# Patient Record
Sex: Female | Born: 1945
Health system: Southern US, Community
[De-identification: ages and names within clinical notes are randomized; demographics above are authoritative.]

## PROBLEM LIST (undated history)

## (undated) ENCOUNTER — Emergency Department (HOSPITAL_COMMUNITY): Admission: EM | Disposition: A | Discharge status: Home / Self Care | Payer: BC Managed Care – PPO

## (undated) DIAGNOSIS — R310 Gross hematuria: Secondary | ICD-10-CM

## (undated) DIAGNOSIS — G8929 Other chronic pain: Secondary | ICD-10-CM

## (undated) DIAGNOSIS — I471 Supraventricular tachycardia, unspecified: Secondary | ICD-10-CM

## (undated) DIAGNOSIS — G40109 Localization-related (focal) (partial) symptomatic epilepsy and epileptic syndromes with simple partial seizures, not intractable, without status epilepticus: Secondary | ICD-10-CM

## (undated) DIAGNOSIS — I350 Nonrheumatic aortic (valve) stenosis: Secondary | ICD-10-CM

## (undated) DIAGNOSIS — Z9889 Other specified postprocedural states: Secondary | ICD-10-CM

## (undated) DIAGNOSIS — N183 Chronic kidney disease, stage 3 unspecified: Secondary | ICD-10-CM

## (undated) DIAGNOSIS — Z8739 Personal history of other diseases of the musculoskeletal system and connective tissue: Secondary | ICD-10-CM

## (undated) DIAGNOSIS — M545 Low back pain, unspecified: Secondary | ICD-10-CM

## (undated) DIAGNOSIS — I2510 Atherosclerotic heart disease of native coronary artery without angina pectoris: Secondary | ICD-10-CM

## (undated) DIAGNOSIS — R569 Unspecified convulsions: Secondary | ICD-10-CM

## (undated) DIAGNOSIS — M1990 Unspecified osteoarthritis, unspecified site: Secondary | ICD-10-CM

## (undated) DIAGNOSIS — F419 Anxiety disorder, unspecified: Secondary | ICD-10-CM

## (undated) DIAGNOSIS — M47812 Spondylosis without myelopathy or radiculopathy, cervical region: Secondary | ICD-10-CM

## (undated) DIAGNOSIS — K219 Gastro-esophageal reflux disease without esophagitis: Secondary | ICD-10-CM

## (undated) DIAGNOSIS — R112 Nausea with vomiting, unspecified: Secondary | ICD-10-CM

## (undated) DIAGNOSIS — F329 Major depressive disorder, single episode, unspecified: Secondary | ICD-10-CM

## (undated) DIAGNOSIS — F32A Depression, unspecified: Secondary | ICD-10-CM

## (undated) DIAGNOSIS — N189 Chronic kidney disease, unspecified: Secondary | ICD-10-CM

## (undated) DIAGNOSIS — I82409 Acute embolism and thrombosis of unspecified deep veins of unspecified lower extremity: Secondary | ICD-10-CM

## (undated) DIAGNOSIS — G5603 Carpal tunnel syndrome, bilateral upper limbs: Principal | ICD-10-CM

## (undated) DIAGNOSIS — K589 Irritable bowel syndrome without diarrhea: Secondary | ICD-10-CM

## (undated) DIAGNOSIS — M6530 Trigger finger, unspecified finger: Secondary | ICD-10-CM

## (undated) DIAGNOSIS — K648 Other hemorrhoids: Secondary | ICD-10-CM

## (undated) DIAGNOSIS — R531 Weakness: Secondary | ICD-10-CM

## (undated) DIAGNOSIS — E039 Hypothyroidism, unspecified: Secondary | ICD-10-CM

## (undated) DIAGNOSIS — R011 Cardiac murmur, unspecified: Secondary | ICD-10-CM

## (undated) DIAGNOSIS — J8410 Pulmonary fibrosis, unspecified: Secondary | ICD-10-CM

## (undated) DIAGNOSIS — Z8719 Personal history of other diseases of the digestive system: Secondary | ICD-10-CM

## (undated) DIAGNOSIS — E669 Obesity, unspecified: Secondary | ICD-10-CM

## (undated) DIAGNOSIS — I10 Essential (primary) hypertension: Secondary | ICD-10-CM

## (undated) DIAGNOSIS — Z87442 Personal history of urinary calculi: Secondary | ICD-10-CM

## (undated) DIAGNOSIS — I951 Orthostatic hypotension: Secondary | ICD-10-CM

## (undated) DIAGNOSIS — E559 Vitamin D deficiency, unspecified: Secondary | ICD-10-CM

## (undated) DIAGNOSIS — K222 Esophageal obstruction: Secondary | ICD-10-CM

## (undated) DIAGNOSIS — H269 Unspecified cataract: Secondary | ICD-10-CM

## (undated) DIAGNOSIS — I4589 Other specified conduction disorders: Secondary | ICD-10-CM

## (undated) DIAGNOSIS — Z5189 Encounter for other specified aftercare: Secondary | ICD-10-CM

## (undated) DIAGNOSIS — R079 Chest pain, unspecified: Secondary | ICD-10-CM

## (undated) DIAGNOSIS — E785 Hyperlipidemia, unspecified: Secondary | ICD-10-CM

## (undated) DIAGNOSIS — Z8774 Personal history of (corrected) congenital malformations of heart and circulatory system: Secondary | ICD-10-CM

## (undated) HISTORY — DX: Other specified conduction disorders: I45.89

## (undated) HISTORY — DX: Gastro-esophageal reflux disease without esophagitis: K21.9

## (undated) HISTORY — DX: Esophageal obstruction: K22.2

## (undated) HISTORY — DX: Trigger finger, unspecified finger: M65.30

## (undated) HISTORY — DX: Unspecified convulsions: R56.9

## (undated) HISTORY — DX: Weakness: R53.1

## (undated) HISTORY — DX: Major depressive disorder, single episode, unspecified: F32.9

## (undated) HISTORY — DX: Cardiac murmur, unspecified: R01.1

## (undated) HISTORY — DX: Low back pain, unspecified: M54.50

## (undated) HISTORY — DX: Orthostatic hypotension: I95.1

## (undated) HISTORY — DX: Hyperlipidemia, unspecified: E78.5

## (undated) HISTORY — DX: Low back pain: M54.5

## (undated) HISTORY — DX: Chronic kidney disease, stage 3 (moderate): N18.3

## (undated) HISTORY — DX: Carpal tunnel syndrome, bilateral upper limbs: G56.03

## (undated) HISTORY — DX: Other chronic pain: G89.29

## (undated) HISTORY — DX: Obesity, unspecified: E66.9

## (undated) HISTORY — DX: Essential (primary) hypertension: I10

## (undated) HISTORY — DX: Unspecified cataract: H26.9

## (undated) HISTORY — DX: Acute embolism and thrombosis of unspecified deep veins of unspecified lower extremity: I82.409

## (undated) HISTORY — DX: Other hemorrhoids: K64.8

## (undated) HISTORY — DX: Depression, unspecified: F32.A

## (undated) HISTORY — DX: Spondylosis without myelopathy or radiculopathy, cervical region: M47.812

## (undated) HISTORY — DX: Vitamin D deficiency, unspecified: E55.9

## (undated) HISTORY — DX: Chronic kidney disease, stage 3 unspecified: N18.30

## (undated) HISTORY — PX: CARDIOVASCULAR STRESS TEST: SHX262

## (undated) HISTORY — DX: Chest pain, unspecified: R07.9

## (undated) HISTORY — DX: Anxiety disorder, unspecified: F41.9

## (undated) HISTORY — DX: Irritable bowel syndrome, unspecified: K58.9

## (undated) HISTORY — DX: Supraventricular tachycardia, unspecified: I47.10

## (undated) HISTORY — DX: Nonrheumatic aortic (valve) stenosis: I35.0

## (undated) HISTORY — PX: ECTOPIC PREGNANCY SURGERY: SHX613

## (undated) HISTORY — DX: Encounter for other specified aftercare: Z51.89

## (undated) HISTORY — DX: Unspecified osteoarthritis, unspecified site: M19.90

## (undated) HISTORY — DX: Supraventricular tachycardia: I47.1

## (undated) HISTORY — PX: CORONARY ANGIOPLASTY WITH STENT PLACEMENT: SHX49

## (undated) HISTORY — DX: Hypothyroidism, unspecified: E03.9

## (undated) HISTORY — PX: COLONOSCOPY: SHX174

---

## 1996-07-03 ENCOUNTER — Encounter: Payer: Self-pay | Admitting: Internal Medicine

## 1997-11-26 ENCOUNTER — Encounter (INDEPENDENT_AMBULATORY_CARE_PROVIDER_SITE_OTHER): Payer: Self-pay | Admitting: *Deleted

## 1997-11-26 ENCOUNTER — Other Ambulatory Visit: Admission: RE | Admit: 1997-11-26 | Discharge: 1997-11-26 | Payer: Self-pay | Admitting: Gastroenterology

## 1998-01-24 ENCOUNTER — Encounter: Payer: Self-pay | Admitting: Gastroenterology

## 1998-01-24 ENCOUNTER — Encounter (INDEPENDENT_AMBULATORY_CARE_PROVIDER_SITE_OTHER): Payer: Self-pay | Admitting: *Deleted

## 1998-01-24 ENCOUNTER — Ambulatory Visit (HOSPITAL_COMMUNITY): Admission: RE | Admit: 1998-01-24 | Discharge: 1998-01-24 | Payer: Self-pay | Admitting: Gastroenterology

## 1998-02-25 ENCOUNTER — Encounter (INDEPENDENT_AMBULATORY_CARE_PROVIDER_SITE_OTHER): Payer: Self-pay | Admitting: *Deleted

## 1998-02-26 ENCOUNTER — Encounter (INDEPENDENT_AMBULATORY_CARE_PROVIDER_SITE_OTHER): Payer: Self-pay | Admitting: *Deleted

## 1998-02-26 ENCOUNTER — Other Ambulatory Visit: Admission: RE | Admit: 1998-02-26 | Discharge: 1998-02-26 | Payer: Self-pay | Admitting: Gastroenterology

## 1998-09-08 ENCOUNTER — Encounter: Payer: Self-pay | Admitting: Adult Health

## 1998-09-19 ENCOUNTER — Encounter: Payer: Self-pay | Admitting: Internal Medicine

## 1998-09-19 ENCOUNTER — Ambulatory Visit (HOSPITAL_COMMUNITY): Admission: RE | Admit: 1998-09-19 | Discharge: 1998-09-19 | Payer: Self-pay | Admitting: Internal Medicine

## 1998-10-23 ENCOUNTER — Ambulatory Visit (HOSPITAL_COMMUNITY): Admission: RE | Admit: 1998-10-23 | Discharge: 1998-10-23 | Payer: Self-pay | Admitting: Pulmonary Disease

## 1998-10-23 ENCOUNTER — Encounter: Payer: Self-pay | Admitting: Pulmonary Disease

## 1998-11-17 ENCOUNTER — Encounter: Payer: Self-pay | Admitting: Internal Medicine

## 2000-11-17 ENCOUNTER — Other Ambulatory Visit: Admission: RE | Admit: 2000-11-17 | Discharge: 2000-11-17 | Payer: Self-pay | Admitting: Obstetrics and Gynecology

## 2001-02-05 ENCOUNTER — Encounter (INDEPENDENT_AMBULATORY_CARE_PROVIDER_SITE_OTHER): Payer: Self-pay | Admitting: *Deleted

## 2001-07-06 ENCOUNTER — Ambulatory Visit (HOSPITAL_COMMUNITY): Admission: RE | Admit: 2001-07-06 | Discharge: 2001-07-06 | Payer: Self-pay | Admitting: Internal Medicine

## 2001-07-06 ENCOUNTER — Encounter (INDEPENDENT_AMBULATORY_CARE_PROVIDER_SITE_OTHER): Payer: Self-pay | Admitting: *Deleted

## 2001-07-06 ENCOUNTER — Encounter: Payer: Self-pay | Admitting: Internal Medicine

## 2001-11-21 ENCOUNTER — Other Ambulatory Visit: Admission: RE | Admit: 2001-11-21 | Discharge: 2001-11-21 | Payer: Self-pay | Admitting: Obstetrics and Gynecology

## 2003-01-22 ENCOUNTER — Other Ambulatory Visit: Admission: RE | Admit: 2003-01-22 | Discharge: 2003-01-22 | Payer: Self-pay | Admitting: Obstetrics and Gynecology

## 2003-08-31 ENCOUNTER — Emergency Department (HOSPITAL_COMMUNITY): Admission: EM | Admit: 2003-08-31 | Discharge: 2003-09-01 | Payer: Self-pay | Admitting: Emergency Medicine

## 2003-09-01 ENCOUNTER — Encounter (INDEPENDENT_AMBULATORY_CARE_PROVIDER_SITE_OTHER): Payer: Self-pay | Admitting: *Deleted

## 2003-10-02 ENCOUNTER — Encounter: Payer: Self-pay | Admitting: Internal Medicine

## 2004-02-11 ENCOUNTER — Ambulatory Visit: Payer: Self-pay | Admitting: Internal Medicine

## 2004-02-28 ENCOUNTER — Ambulatory Visit (HOSPITAL_COMMUNITY): Admission: RE | Admit: 2004-02-28 | Discharge: 2004-02-28 | Payer: Self-pay | Admitting: Internal Medicine

## 2004-02-28 ENCOUNTER — Ambulatory Visit: Payer: Self-pay | Admitting: Internal Medicine

## 2004-04-07 ENCOUNTER — Other Ambulatory Visit: Admission: RE | Admit: 2004-04-07 | Discharge: 2004-04-07 | Payer: Self-pay | Admitting: Obstetrics and Gynecology

## 2004-06-02 ENCOUNTER — Encounter: Admission: RE | Admit: 2004-06-02 | Discharge: 2004-06-02 | Payer: Self-pay | Admitting: Obstetrics and Gynecology

## 2004-06-02 ENCOUNTER — Encounter (INDEPENDENT_AMBULATORY_CARE_PROVIDER_SITE_OTHER): Payer: Self-pay | Admitting: Specialist

## 2004-07-13 ENCOUNTER — Encounter: Admission: RE | Admit: 2004-07-13 | Discharge: 2004-07-13 | Payer: Self-pay | Admitting: General Surgery

## 2004-07-16 ENCOUNTER — Encounter: Admission: RE | Admit: 2004-07-16 | Discharge: 2004-07-16 | Payer: Self-pay | Admitting: General Surgery

## 2004-07-16 ENCOUNTER — Encounter (INDEPENDENT_AMBULATORY_CARE_PROVIDER_SITE_OTHER): Payer: Self-pay | Admitting: *Deleted

## 2004-07-16 ENCOUNTER — Ambulatory Visit (HOSPITAL_COMMUNITY): Admission: RE | Admit: 2004-07-16 | Discharge: 2004-07-16 | Payer: Self-pay | Admitting: General Surgery

## 2004-07-16 ENCOUNTER — Ambulatory Visit (HOSPITAL_BASED_OUTPATIENT_CLINIC_OR_DEPARTMENT_OTHER): Admission: RE | Admit: 2004-07-16 | Discharge: 2004-07-16 | Payer: Self-pay | Admitting: General Surgery

## 2005-01-13 ENCOUNTER — Ambulatory Visit: Payer: Self-pay | Admitting: Internal Medicine

## 2005-01-20 ENCOUNTER — Ambulatory Visit: Payer: Self-pay | Admitting: Internal Medicine

## 2005-01-20 ENCOUNTER — Ambulatory Visit (HOSPITAL_COMMUNITY): Admission: RE | Admit: 2005-01-20 | Discharge: 2005-01-20 | Payer: Self-pay | Admitting: Internal Medicine

## 2006-01-06 ENCOUNTER — Ambulatory Visit: Payer: Self-pay | Admitting: Internal Medicine

## 2007-01-12 DIAGNOSIS — I219 Acute myocardial infarction, unspecified: Secondary | ICD-10-CM

## 2007-01-12 HISTORY — DX: Acute myocardial infarction, unspecified: I21.9

## 2007-02-03 ENCOUNTER — Ambulatory Visit: Payer: Self-pay | Admitting: Internal Medicine

## 2007-07-24 ENCOUNTER — Telehealth (INDEPENDENT_AMBULATORY_CARE_PROVIDER_SITE_OTHER): Payer: Self-pay | Admitting: *Deleted

## 2007-07-24 DIAGNOSIS — R1310 Dysphagia, unspecified: Secondary | ICD-10-CM | POA: Insufficient documentation

## 2007-08-30 ENCOUNTER — Ambulatory Visit: Payer: Self-pay | Admitting: Internal Medicine

## 2007-12-04 ENCOUNTER — Telehealth: Payer: Self-pay | Admitting: Internal Medicine

## 2008-01-19 DIAGNOSIS — K589 Irritable bowel syndrome without diarrhea: Secondary | ICD-10-CM

## 2008-01-19 DIAGNOSIS — K219 Gastro-esophageal reflux disease without esophagitis: Secondary | ICD-10-CM | POA: Insufficient documentation

## 2008-01-22 ENCOUNTER — Ambulatory Visit: Payer: Self-pay | Admitting: Internal Medicine

## 2008-06-27 ENCOUNTER — Encounter: Admission: RE | Admit: 2008-06-27 | Discharge: 2008-06-27 | Payer: Self-pay | Admitting: Obstetrics and Gynecology

## 2008-07-05 ENCOUNTER — Encounter: Admission: RE | Admit: 2008-07-05 | Discharge: 2008-07-05 | Payer: Self-pay | Admitting: Obstetrics and Gynecology

## 2008-08-09 ENCOUNTER — Ambulatory Visit (HOSPITAL_BASED_OUTPATIENT_CLINIC_OR_DEPARTMENT_OTHER): Admission: RE | Admit: 2008-08-09 | Discharge: 2008-08-09 | Payer: Self-pay | Admitting: Surgery

## 2008-08-09 ENCOUNTER — Encounter (INDEPENDENT_AMBULATORY_CARE_PROVIDER_SITE_OTHER): Payer: Self-pay | Admitting: Surgery

## 2008-08-09 ENCOUNTER — Encounter: Admission: RE | Admit: 2008-08-09 | Discharge: 2008-08-09 | Payer: Self-pay | Admitting: Surgery

## 2008-08-09 HISTORY — PX: OTHER SURGICAL HISTORY: SHX169

## 2009-03-10 ENCOUNTER — Ambulatory Visit: Payer: Self-pay | Admitting: Internal Medicine

## 2009-03-31 ENCOUNTER — Telehealth: Payer: Self-pay | Admitting: Internal Medicine

## 2009-04-15 ENCOUNTER — Ambulatory Visit: Payer: Self-pay | Admitting: Internal Medicine

## 2009-04-15 DIAGNOSIS — R1013 Epigastric pain: Secondary | ICD-10-CM

## 2009-04-17 ENCOUNTER — Ambulatory Visit: Payer: Self-pay | Admitting: Internal Medicine

## 2009-06-21 ENCOUNTER — Emergency Department (HOSPITAL_COMMUNITY): Admission: EM | Admit: 2009-06-21 | Discharge: 2009-06-21 | Payer: Self-pay | Admitting: Emergency Medicine

## 2009-10-27 ENCOUNTER — Emergency Department (HOSPITAL_COMMUNITY): Admission: EM | Admit: 2009-10-27 | Discharge: 2009-10-28 | Payer: Self-pay | Admitting: Emergency Medicine

## 2009-11-06 ENCOUNTER — Encounter: Payer: Self-pay | Admitting: Nurse Practitioner

## 2009-11-22 ENCOUNTER — Encounter (INDEPENDENT_AMBULATORY_CARE_PROVIDER_SITE_OTHER): Payer: Self-pay | Admitting: *Deleted

## 2009-11-22 ENCOUNTER — Ambulatory Visit: Payer: Self-pay | Admitting: Cardiology

## 2009-11-22 ENCOUNTER — Inpatient Hospital Stay (HOSPITAL_COMMUNITY)
Admission: EM | Admit: 2009-11-22 | Discharge: 2009-11-25 | Payer: Self-pay | Source: Home / Self Care | Admitting: Emergency Medicine

## 2009-11-23 ENCOUNTER — Encounter (INDEPENDENT_AMBULATORY_CARE_PROVIDER_SITE_OTHER): Payer: Self-pay | Admitting: *Deleted

## 2009-11-25 ENCOUNTER — Encounter (INDEPENDENT_AMBULATORY_CARE_PROVIDER_SITE_OTHER): Payer: Self-pay | Admitting: *Deleted

## 2009-11-26 ENCOUNTER — Encounter (INDEPENDENT_AMBULATORY_CARE_PROVIDER_SITE_OTHER): Payer: Self-pay | Admitting: *Deleted

## 2009-12-09 ENCOUNTER — Encounter: Payer: Self-pay | Admitting: Nurse Practitioner

## 2009-12-10 ENCOUNTER — Telehealth: Payer: Self-pay | Admitting: Internal Medicine

## 2009-12-10 ENCOUNTER — Encounter: Payer: Self-pay | Admitting: Nurse Practitioner

## 2009-12-12 ENCOUNTER — Encounter: Payer: Self-pay | Admitting: Internal Medicine

## 2009-12-12 ENCOUNTER — Ambulatory Visit: Payer: Self-pay | Admitting: Gastroenterology

## 2009-12-12 DIAGNOSIS — D649 Anemia, unspecified: Secondary | ICD-10-CM | POA: Insufficient documentation

## 2009-12-14 DIAGNOSIS — I2510 Atherosclerotic heart disease of native coronary artery without angina pectoris: Secondary | ICD-10-CM | POA: Insufficient documentation

## 2009-12-18 ENCOUNTER — Ambulatory Visit: Payer: Self-pay | Admitting: Internal Medicine

## 2009-12-25 ENCOUNTER — Encounter (HOSPITAL_COMMUNITY)
Admission: RE | Admit: 2009-12-25 | Discharge: 2010-02-10 | Payer: Self-pay | Source: Home / Self Care | Attending: Cardiology | Admitting: Cardiology

## 2010-01-16 ENCOUNTER — Ambulatory Visit: Payer: Self-pay | Admitting: Oncology

## 2010-01-30 LAB — CBC WITH DIFFERENTIAL/PLATELET
BASO%: 0.6 % (ref 0.0–2.0)
Basophils Absolute: 0 10*3/uL (ref 0.0–0.1)
EOS%: 1.9 % (ref 0.0–7.0)
Eosinophils Absolute: 0.1 10*3/uL (ref 0.0–0.5)
HCT: 30.6 % — ABNORMAL LOW (ref 34.8–46.6)
HGB: 10.1 g/dL — ABNORMAL LOW (ref 11.6–15.9)
LYMPH%: 26.9 % (ref 14.0–49.7)
MCH: 27.7 pg (ref 25.1–34.0)
MCHC: 33.1 g/dL (ref 31.5–36.0)
MCV: 83.7 fL (ref 79.5–101.0)
MONO#: 0.6 10*3/uL (ref 0.1–0.9)
MONO%: 9.8 % (ref 0.0–14.0)
NEUT#: 3.6 10*3/uL (ref 1.5–6.5)
NEUT%: 60.8 % (ref 38.4–76.8)
Platelets: 263 10*3/uL (ref 145–400)
RBC: 3.65 10*6/uL — ABNORMAL LOW (ref 3.70–5.45)
RDW: 14.2 % (ref 11.2–14.5)
WBC: 5.9 10*3/uL (ref 3.9–10.3)
lymph#: 1.6 10*3/uL (ref 0.9–3.3)

## 2010-01-30 LAB — CHCC SMEAR

## 2010-02-03 LAB — SPEP & IFE WITH QIG
Alpha-1-Globulin: 4.5 % (ref 2.9–4.9)
Alpha-2-Globulin: 10.8 % (ref 7.1–11.8)
Beta 2: 4.3 % (ref 3.2–6.5)
Beta Globulin: 5.7 % (ref 4.7–7.2)
Gamma Globulin: 17.5 % (ref 11.1–18.8)
IgG (Immunoglobin G), Serum: 1410 mg/dL (ref 694–1618)
IgM, Serum: 174 mg/dL (ref 60–263)
Total Protein, Serum Electrophoresis: 7 g/dL (ref 6.0–8.3)

## 2010-02-03 LAB — COMPREHENSIVE METABOLIC PANEL
ALT: 10 U/L (ref 0–35)
AST: 20 U/L (ref 0–37)
Albumin: 4.1 g/dL (ref 3.5–5.2)
Alkaline Phosphatase: 123 U/L — ABNORMAL HIGH (ref 39–117)
BUN: 19 mg/dL (ref 6–23)
CO2: 25 mEq/L (ref 19–32)
Calcium: 9.1 mg/dL (ref 8.4–10.5)
Chloride: 103 mEq/L (ref 96–112)
Creatinine, Ser: 1.26 mg/dL — ABNORMAL HIGH (ref 0.40–1.20)
Glucose, Bld: 108 mg/dL — ABNORMAL HIGH (ref 70–99)
Potassium: 4.1 mEq/L (ref 3.5–5.3)
Sodium: 140 mEq/L (ref 135–145)
Total Bilirubin: 0.5 mg/dL (ref 0.3–1.2)
Total Protein: 7 g/dL (ref 6.0–8.3)

## 2010-02-03 LAB — IRON AND TIBC
%SAT: 16 % — ABNORMAL LOW (ref 20–55)
Iron: 53 ug/dL (ref 42–145)
TIBC: 340 ug/dL (ref 250–470)
UIBC: 287 ug/dL

## 2010-02-03 LAB — FERRITIN: Ferritin: 7 ng/mL — ABNORMAL LOW (ref 10–291)

## 2010-02-03 LAB — VITAMIN B12: Vitamin B-12: 281 pg/mL (ref 211–911)

## 2010-02-03 LAB — FOLATE: Folate: 15.4 ng/mL

## 2010-02-03 LAB — ERYTHROPOIETIN: Erythropoietin: 75.4 m[IU]/mL — ABNORMAL HIGH (ref 2.6–34.0)

## 2010-02-11 ENCOUNTER — Encounter (HOSPITAL_COMMUNITY): Payer: BC Managed Care – PPO | Attending: Cardiology

## 2010-02-11 DIAGNOSIS — F3289 Other specified depressive episodes: Secondary | ICD-10-CM | POA: Insufficient documentation

## 2010-02-11 DIAGNOSIS — G40909 Epilepsy, unspecified, not intractable, without status epilepticus: Secondary | ICD-10-CM | POA: Insufficient documentation

## 2010-02-11 DIAGNOSIS — Z9861 Coronary angioplasty status: Secondary | ICD-10-CM | POA: Insufficient documentation

## 2010-02-11 DIAGNOSIS — F329 Major depressive disorder, single episode, unspecified: Secondary | ICD-10-CM | POA: Insufficient documentation

## 2010-02-11 DIAGNOSIS — K219 Gastro-esophageal reflux disease without esophagitis: Secondary | ICD-10-CM | POA: Insufficient documentation

## 2010-02-11 DIAGNOSIS — Z5189 Encounter for other specified aftercare: Secondary | ICD-10-CM | POA: Insufficient documentation

## 2010-02-11 DIAGNOSIS — I2119 ST elevation (STEMI) myocardial infarction involving other coronary artery of inferior wall: Secondary | ICD-10-CM | POA: Insufficient documentation

## 2010-02-11 DIAGNOSIS — I2510 Atherosclerotic heart disease of native coronary artery without angina pectoris: Secondary | ICD-10-CM | POA: Insufficient documentation

## 2010-02-11 DIAGNOSIS — E039 Hypothyroidism, unspecified: Secondary | ICD-10-CM | POA: Insufficient documentation

## 2010-02-11 DIAGNOSIS — I10 Essential (primary) hypertension: Secondary | ICD-10-CM | POA: Insufficient documentation

## 2010-02-11 DIAGNOSIS — E785 Hyperlipidemia, unspecified: Secondary | ICD-10-CM | POA: Insufficient documentation

## 2010-02-12 NOTE — Letter (Signed)
Summary: [facility] Instructions  [facility]  [address] [place], [place] [address]   Phone: [phone]  Fax: [phone]       [name]    1945/03/02    MRN: [id number]        Procedure Day /Date: 12-19-2009     Arrival Time:8:00 Am      Procedure Time: 9:00 AM     Location of Procedure:                     X     [facility] ( Outpatient Registration)   PREPARATION FOR COLONOSCOPY WITH MOVIPREP   Starting 5 days prior to your procedure 12-14-2009 do not eat nuts, seeds, popcorn, corn, beans, peas,  salads, or any raw vegetables.  Do not take any fiber supplements (e.g. Metamucil, Citrucel, and Benefiber).  THE DAY BEFORE YOUR PROCEDURE         DATE: 12-18-2009  DAY: Thursday  1.  Drink clear liquids the entire day-NO SOLID FOOD  2.  Do not drink anything colored red or purple.  Avoid juices with pulp.  No orange juice.  3.  Drink at least 64 oz. (8 glasses) of fluid/clear liquids during the day to prevent dehydration and help the prep work efficiently.  CLEAR LIQUIDS INCLUDE: Water Jello Ice Popsicles Tea (sugar ok, no milk/cream) Powdered fruit flavored drinks Coffee (sugar ok, no milk/cream) Gatorade Juice: apple, white grape, white cranberry  Lemonade Clear bullion, consomm, broth Carbonated beverages (any kind) Strained chicken noodle soup Hard Candy                             4.  In the morning, mix first dose of MoviPrep solution:    Empty 1 Pouch A and 1 Pouch B into the disposable container    Add lukewarm drinking water to the top line of the container. Mix to dissolve    Refrigerate (mixed solution should be used within 24 hrs)  5.  Begin drinking the prep at 5:00 p.m. The MoviPrep container is divided by 4 marks.   Every 15 minutes drink the solution down to the next mark (approximately 8 oz) until the full liter is complete.   6.  Follow completed prep with 16 oz of clear liquid of your choice (Nothing red or  purple).  Continue to drink clear liquids until bedtime.  7.  Before going to bed, mix second dose of MoviPrep solution:    Empty 1 Pouch A and 1 Pouch B into the disposable container    Add lukewarm drinking water to the top line of the container. Mix to dissolve    Refrigerate  THE DAY OF YOUR PROCEDURE      DATE: 12-19-2009 DAY: Friday  Beginning at 5:00 AM  (5 hours before procedure):         1. Every 15 minutes, drink the solution down to the next mark (approx 8 oz) until the full liter is complete.  2. Follow completed prep with 16 oz. of clear liquid of your choice.    3. You may drink clear liquids until 7:00 AM  (2 HOURS BEFORE PROCEDURE).   MEDICATION INSTRUCTIONS  Unless otherwise instructed, you should take regular prescription medications with a small sip of water   as early as possible the morning of your procedure.  Stay on the Plavix medication. We will notify you if any changes needs  made. We will notify your cardiologist you are having the procedures.          OTHER INSTRUCTIONS  You will need a responsible adult at least 64 years of age to accompany you and drive you home.   This person must remain in the waiting room during your procedure.  Wear loose fitting clothing that is easily removed.  Leave jewelry and other valuables at home.  However, you may wish to bring a book to read or  an iPod/MP3 player to listen to music as you wait for your procedure to start.  Remove all body piercing jewelry and leave at home.  Total time from sign-in until discharge is approximately 2-3 hours.  You should go home directly after your procedure and rest.  You can resume normal activities the  day after your procedure.  The day of your procedure you should not:   Drive   Make legal decisions   Operate machinery   Drink alcohol   Return to work  You will receive specific instructions about eating, activities and medications before you leave.    The  above instructions have been reviewed and explained to me by   _______________________    I fully understand and can verbalize these instructions _____________________________ Date _________  Appended Document: Moviprep Instructions LM for pt to advise her that Dr Marina Goodell requested we change her proc date to Thurs 12-18-09 at 10.30, arriving at 9:30 AM. I asked her to call me and I will review her  instructions with her.  Appended Document: Moviprep Instructions The pt 's husband called and I asked if the 8th in our private Endoscopy Suite at 10 :30 arriving at 9:30 AM was acceptable.  He said his wife is fine with the change.  I reviewed the changes on the instructions with the times and he understood and said he would let his wife know about the changes.  She was napping and that is why he called me back.

## 2010-02-12 NOTE — Progress Notes (Signed)
Summary: TRIAGE-CHEST PAIN   Phone Note Call from Patient Call back at Home Phone [phone]   Caller: Patient Call For: Marina Goodell Reason for Call: Talk to Nurse Summary of Call: Patient wants to be seen sooner than first available appt 4-5 do to chest pain since last week Initial call taken by: Tawni Levy,  March 31, 2009 8:43 AM  Follow-up for Phone Call        Pt. c/o constant chest pain for 1 week, gets more severe at times.  Denies sweating, SOB, pain in neck,jaw or arms. Some nausea and esophageal burning with the pain. Dysphagia x1 month. Takes Pantoprazole 40mg  two times a day. Offered an appt. today with PA, pt. declines, states she will only see Dr.Perry.  1) See Dr.Perry on 04-15-09 at 1:30pm 2) Continue Pantoprazole as directed 3) May use Mylanta,Maalox,etc. as needed 4) Soft,bland diet. Be very careful with meats,rice and breads. 5) If chest pain gets worse, if you have any SOB, sweating, pain in neck, arms,or jaw, n/v,etc. call 911 and go to the ER immediately.  6) Please call your Cardiologist today and update him on your condition. 7) Pt. instructed to call back as needed.  Follow-up by: Laureen Ochs LPN,  March 31, 2009 9:36 AM

## 2010-02-12 NOTE — Procedures (Signed)
Summary: Upper Endoscopy  Patient: [name] Note: All result statuses are Final unless otherwise noted.  Tests: (1) Upper Endoscopy (EGD)   EGD Upper Endoscopy       DONE     [facility]     [address]     [place], [place]  [address]           ENDOSCOPY PROCEDURE REPORT           PATIENT:  [name], [name]  MR#:  [id number]     BIRTHDATE:  Oct 27, 1945, 64 yrs. old  GENDER:  female           ENDOSCOPIST:  Wilhemina Bonito. Eda Keys, MD     Referred by:  Office           PROCEDURE DATE:  12/18/2009     PROCEDURE:  EGD, diagnostic 63875     ASA CLASS:  Class III     INDICATIONS:  anemia           MEDICATIONS:   There was residual sedation effect present from     prior procedure.     TOPICAL ANESTHETIC:  none           DESCRIPTION OF PROCEDURE:   After the risks benefits and     alternatives of the procedure were thoroughly explained, informed     consent was obtained.  The [facility] [id number] endoscope was     introduced through the mouth and advanced to the third portion of     the duodenum, without limitations.  The instrument was slowly     withdrawn as the mucosa was fully examined.     <<PROCEDUREIMAGES>>           The upper, middle, and distal third of the esophagus were     carefully inspected and no abnormalities were noted. The z-line     was well seen at the GEJ. The endoscope was pushed into the fundus     which was normal including a retroflexed view. Except for     incidental fundic gland polyps in the bogy/fundus, the     antrum,gastric body, first and second part of the duodenum were     unremarkable.    Retroflexed views revealed a small hiatal hernia.     The scope was then withdrawn from the patient and the procedure     completed.           COMPLICATIONS:  None           ENDOSCOPIC IMPRESSION:     1) Essentially Normal EGD     2) GERD     3) NO EVIDENCE FOR GI BLOOD LOSS AS AN EXPLAINATION FOR ANEMIA           RECOMMENDATIONS:     1) RETURN TO  THE CARE OF DR. Katrinka Blazing FOR FURTHER INVESTIGATION OF     ANEMIA     2) GI FOLLOW UP PRN           ______________________________     Wilhemina Bonito. Eda Keys, MD           CC:  Merri Brunette, MD, The Patient, Armanda Magic MD           n.     Rosalie DoctorWilhemina Bonito. Eda Keys at 12/18/2009 11:52 AM           Lady Saucier, [phone]  Note: An exclamation mark (!) indicates a result that  was not dispersed into the flowsheet. Document Creation Date: 12/18/2009 11:52 AM _______________________________________________________________________  (1) Order result status: Final Collection or observation date-time: 12/18/2009 11:44 Requested date-time:  Receipt date-time:  Reported date-time:  Referring Physician:   Ordering Physician: Fransico Setters [phone]) Specimen Source:  Source: Launa Grill Order Number: [id number] Lab site:

## 2010-02-12 NOTE — Progress Notes (Signed)
Summary: triage   Phone Note Call from Patient Call back at Home Phone [phone]   Caller: Patient Call For: Dr. Marina Goodell Reason for Call: Talk to Nurse Summary of Call: pt had heart attack several weeks ago, hospital did blood work and found hemo was around 93 and wants her to be seen her soon Initial call taken by: [place] Johnson,  December 10, 2009 1:53 PM  Follow-up for Phone Call        Pt. seen by Dr.CandiceSmith her PCP at [facility] today and hgb. 9.8 per pt. Dr. told her to f/u wit Dr.Wilma Wuthrich.Appt. made with N.P.for Friday and she is going to call PCP and [facility] cardiologist (was in hosp for M.I.) to have records faxed for appt. If they won't fax she will come in and sign record release. Follow-up by: Teryl Lucy RN,  December 10, 2009 2:56 PM

## 2010-02-12 NOTE — Letter (Signed)
Summary: [facility]   Imported By: Lester [place] 01/07/2010 07:40:57  _____________________________________________________________________  External Attachment:    Type:   Image     Comment:   External Document

## 2010-02-12 NOTE — Assessment & Plan Note (Signed)
Summary: CHEST PAIN / DYSPHAGIA           History of Present Illness Visit Type: Follow-up Visit Primary GI MD: Yancey Flemings MD Primary Provider: Severiano Gilbert, MD Chief Complaint: chest pain non-cardiac  History of Present Illness:   65 year old female with hypothyroidism, hypertension, osteoarthritis, GERD, and IBS. She was evaluated in the office 5 weeks ago reported doing well. However, she tells me that she has been having symptoms of dysphagia, epigastric pain, and abdominal cramping with diarrhea for months. Some nausea and regurgitation. She has trouble with certain pills such as potassium. She takes pantoprazole b.i.d. Dysphagia seems most prominent to solids. No weight loss. No bleeding. Her last upper endoscopy was in 2009. She was dilated with a Maloney dilator. She thinks that helped. Her anniversary for colonoscopy as around 2012. She is on bupropion for anxiety. She wonders if this might be responsible for some symptoms. Food exacerbates symptoms. She cannot find anything in particular but relieves are improved symptoms.. Her multiple chronic medical problems are otherwise stable.   GI Review of Systems    Reports abdominal pain, acid reflux, chest pain, dysphagia with solids, nausea, and  vomiting.     Location of  Abdominal pain: epigastric area.    Denies belching, bloating, dysphagia with liquids, heartburn, loss of appetite, vomiting blood, weight loss, and  weight gain.      Reports diarrhea.     Denies anal fissure, black tarry stools, change in bowel habit, constipation, diverticulosis, fecal incontinence, heme positive stool, hemorrhoids, irritable bowel syndrome, jaundice, light color stool, liver problems, rectal bleeding, and  rectal pain.    Current Medications (verified): 1)  Pantoprazole Sodium 40 Mg Tbec (Pantoprazole Sodium) .Marland Kitchen.. 1 Tablet By Mouth Two Times A Day 2)  Dilantin 100 Mg Caps (Phenytoin Sodium Extended) .... Three Times A Day 3)  Levoxyl 125 Mcg Tabs  (Levothyroxine Sodium) .... Once Daily 4)  Calcitriol 0.5 Mcg Caps (Calcitriol) .... Take Two Tabs By Mouth Once Daily 5)  Paxil 40 Mg Tabs (Paroxetine Hcl) .... Once Daily 6)  Hyzaar 100-25 Mg Tabs (Losartan Potassium-Hctz) .... Once Daily 7)  Metoprolol Tartrate 50 Mg Tabs (Metoprolol Tartrate) .... Take 1/2 Daily 8)  Doxazosin Mesylate 4 Mg Tabs (Doxazosin Mesylate) .... Take 1/2 Tab Every Eveining 9)  Multivitamins  Tabs (Multiple Vitamin) .... Once Daily 10)  Calcium-Magnesium 250-125 Mg Tabs (Calcium-Magnesium) .... Take One By Mouth Once Daily 11)  Vitamin E 400 Unit Caps (Vitamin E) .... Take 2 Daily 12)  Tylenol Pm Extra Strength 500-25 Mg Tabs (Diphenhydramine-Apap (Sleep)) .... Take One By Mouth At Bedtime 13)  Tylenol Extra Strength 500 Mg Tabs (Acetaminophen) .... Take Two By Mouth At Bedtime 14)  K-Lor 20 Meq Pack (Potassium Chloride) .... Take Two By Mouth Once Daily 15)  Bupropion Hcl 150 Mg Xr12h-Tab (Bupropion Hcl) .... Once Daily 16)  Tramadol Hcl 50 Mg Tabs (Tramadol Hcl) .... As Needed  Allergies (verified): No Known Drug Allergies  Past History:  Past Medical History: Reviewed history from 03/10/2009 and no changes required. Current Problems:  IBS (ICD-564.1) GERD (ICD-530.81) DYSPHAGIA UNSPECIFIED (ICD-787.20) Hypertension Depression Hypothyroidism  Past Surgical History: Reviewed history from 03/10/2009 and no changes required. right breast surgery X 2 years  Family History: Reviewed history from 03/10/2009 and no changes required. Family History of Breast Cancer: sister Family History of Heart Disease: father No FH of Colon Cancer:  Social History: Reviewed history from 03/10/2009 and no changes required. Patient is a former smoker.  Alcohol Use - no Daily Caffeine Use coffee Illicit Drug Use - no  Review of Systems       The patient complains of allergy/sinus, arthritis/joint pain, back pain, cough, depression-new, fatigue, headaches-new,  muscle pains/cramps, sleeping problems, and thirst - excessive.    Vital Signs:  Patient profile:   65 year old female Height:      59 inches Weight:      139 pounds BMI:     28.18 Pulse rate:   80 / minute Pulse rhythm:   regular BP sitting:   142 / 90  (left arm) Cuff size:   regular  Vitals Entered By: June McMurray CMA Duncan Dull) (April 15, 2009 1:30 PM)  Physical Exam  General:  Well developed, well nourished, no acute distress. Head:  Normocephalic and atraumatic. Eyes:  PERRLA, no icterus. Nose:  No deformity, discharge,  or lesions. Mouth:  No deformity or lesions. Neck:  Supple; no masses or thyromegaly. Lungs:  Clear throughout to auscultation. Heart:  Regular rate and rhythm; no murmurs, rubs,  or bruits. Abdomen:  Soft, obese,nontender and nondistended. No masses, hepatosplenomegaly or hernias noted. Normal bowel sounds. Msk:  Symmetrical with no gross deformities. Normal posture. Pulses:  Normal pulses noted. Extremities:  No clubbing, cyanosis, edema or deformities noted. Neurologic:  Alert and  oriented x4;  Skin:  the jaundice Psych:  Alert and cooperative. Normal mood and affect.   Impression & Recommendations:  Problem # 1:  DYSPHAGIA UNSPECIFIED (ICD-787.20) intermittent dysphagia. Likely due to recurrent peptic stricture.  Plan: #1. Upper endoscopy with esophageal dilation. The nature of the procedure as well as the risks, benefits, and alternatives have been reviewed. Understood and agreed to proceed.  Problem # 2:  GERD (ICD-530.81) ongoing.  Plan #1. Continue PPI #2. strict adherence to reflux precautions  Problem # 3:  IBS (ICD-564.1) this is the most likely cause for intermittent abdominal cramping followed by diarrhea. Consider antispasmodic if symptoms persist  Problem # 4:  ABDOMINAL PAIN-EPIGASTRIC (ICD-789.06) epigastric pain either due to acid peptic disorder, IBS, or functional. Could consider gallbladder ultrasound, notably this was  negative previously. We'll start with upper endoscopy.  Other Orders: EGD SAV (EGD SAV)  Patient Instructions: 1)  EGD with [facility].  [facility]  04/17/09 2:30 pm arrive at 1:30 pm 2)  Upper Endoscopy brochure given.  3)  Upper Endoscopy with Dilatation brochure given.  4)  The medication list was reviewed and reconciled.  All changed / newly prescribed medications were explained.  A complete medication list was provided to the patient / caregiver. 5)  printed and given to patient. Milford Cage NCMA  April 15, 2009 2:01 PM

## 2010-02-12 NOTE — Assessment & Plan Note (Signed)
Summary: GERD annual followup    History of Present Illness Visit Type: Follow-up Visit Primary GI MD: Yancey Flemings MD Primary Provider: Severiano Gilbert, MD Chief Complaint: Patient here for Protonix refills, she denies any GI complaints.  History of Present Illness:   65 year old female who presents today for followup regarding ongoing management of her reflux disease. Her chronic medical problems include hypothyroidism, hypertension, osteoarthritis,IBS, and GERD. She was last evaluated in the office 13 months ago. Since that time she has continued on pantoprazole 40 mg b.i.d. Overall, good control of reflux symptoms. Some dysphagia to liquids. No significant recurrent solid food dysphagia. Last upper endoscopy in late 2009. She has had 30 pound weight loss by intent. She is dieting and exercising. No significant lower GI complaints. She is due for followup colonoscopy around 2012.   GI Review of Systems      Denies abdominal pain, acid reflux, belching, bloating, chest pain, dysphagia with liquids, dysphagia with solids, heartburn, loss of appetite, nausea, vomiting, vomiting blood, weight loss, and  weight gain.        Denies anal fissure, black tarry stools, change in bowel habit, constipation, diarrhea, diverticulosis, fecal incontinence, heme positive stool, hemorrhoids, irritable bowel syndrome, jaundice, light color stool, liver problems, rectal bleeding, and  rectal pain. Preventive Screening-Counseling & Management  Alcohol-Tobacco     Smoking Status: quit      Drug Use:  no.      Current Medications (verified): 1)  Pantoprazole Sodium 40 Mg Tbec (Pantoprazole Sodium) .Marland Kitchen.. 1 Tablet By Mouth Two Times A Day 2)  Dilantin 100 Mg Caps (Phenytoin Sodium Extended) .... Three Times A Day 3)  Levoxyl 125 Mcg Tabs (Levothyroxine Sodium) .... Once Daily 4)  Calcitriol 0.5 Mcg Caps (Calcitriol) .... Take Two Tabs By Mouth Once Daily 5)  Paxil 40 Mg Tabs (Paroxetine Hcl) .... Once  Daily 6)  Hyzaar 100-25 Mg Tabs (Losartan Potassium-Hctz) .... Once Daily 7)  Metoprolol Tartrate 50 Mg Tabs (Metoprolol Tartrate) .... Take 1/2 Daily 8)  Doxazosin Mesylate 4 Mg Tabs (Doxazosin Mesylate) .... Take 1/2 Tab Every Eveining 9)  Multivitamins  Tabs (Multiple Vitamin) .... Once Daily 10)  Calcium-Magnesium 250-125 Mg Tabs (Calcium-Magnesium) .... Take One By Mouth Once Daily 11)  Vitamin E 400 Unit Caps (Vitamin E) .... Take 2 Daily 12)  Tylenol Pm Extra Strength 500-25 Mg Tabs (Diphenhydramine-Apap (Sleep)) .... Take One By Mouth At Bedtime 13)  Tylenol Extra Strength 500 Mg Tabs (Acetaminophen) .... Take Two By Mouth At Bedtime 14)  K-Lor 20 Meq Pack (Potassium Chloride) .... Take Two By Mouth Once Daily  Allergies (verified): No Known Drug Allergies  Past History:  Family History: Last updated: 03/10/2009 Family History of Breast Cancer: sister Family History of Heart Disease: father No FH of Colon Cancer:  Social History: Last updated: 03/10/2009 Patient is a former smoker.  Alcohol Use - no Daily Caffeine Use coffee Illicit Drug Use - no  Risk Factors: Smoking Status: quit (03/10/2009)  Past Medical History: Current Problems:  IBS (ICD-564.1) GERD (ICD-530.81) DYSPHAGIA UNSPECIFIED (ICD-787.20) Hypertension Depression Hypothyroidism  Past Surgical History: right breast surgery X 2 years  Family History: Family History of Breast Cancer: sister Family History of Heart Disease: father No FH of Colon Cancer:  Social History: Patient is a former smoker.  Alcohol Use - no Daily Caffeine Use coffee Illicit Drug Use - no Smoking Status:  quit Drug Use:  no  Review of Systems       The patient complains  of allergy/sinus, arthritis/joint pain, back pain, fatigue, muscle pains/cramps, shortness of breath, sleeping problems, swelling of feet/legs, and thirst - excessive.  The patient denies anemia, anxiety-new, blood in urine, breast changes/lumps,  change in vision, confusion, cough, coughing up blood, depression-new, fainting, fever, headaches-new, hearing problems, heart murmur, heart rhythm changes, itching, menstrual pain, night sweats, nosebleeds, pregnancy symptoms, skin rash, sore throat, swollen lymph glands, thirst - excessive , urination - excessive , urination changes/pain, urine leakage, vision changes, and voice change.    Vital Signs:  Patient profile:   65 year old female Height:      59 inches Weight:      138.0 pounds BMI:     27.97 Pulse rate:   84 / minute Pulse rhythm:   regular BP sitting:   128 / 78  (left arm) Cuff size:   regular  Vitals Entered By: Harlow Mares CMA Duncan Dull) (March 10, 2009 3:36 PM)  Physical Exam  General:  Well developed, well nourished, no acute distress. Head:  Normocephalic and atraumatic. Eyes:  PERRLA, no icterus. Mouth:  No deformity or lesions,  Lungs:  Clear throughout to auscultation. Heart:  Regular rate and rhythm; no murmurs, rubs,  or bruits. Abdomen:  Soft, nontender and nondistended. No masses, hepatosplenomegaly or hernias noted. Normal bowel sounds. Pulses:  Normal pulses noted. Extremities:  No clubbing, cyanosis, edema or deformities noted. Neurologic:  Alert and  oriented x4;   Skin:  Intact without significant lesions or rashes. Psych:  Alert and cooperative. Normal mood and affect.   Impression & Recommendations:  Problem # 1:  GERD (ICD-530.81) chronic GERD seemingly under good control  Plan: #1. Continue reflux precautions. The patient was praised with regards to her weight loss #2. Continue pantoprazole 40 mg b.i.d. A prescription with multiple refills submitted #3. Routine office followup in one year  Problem # 2:  DYSPHAGIA UNSPECIFIED (ICD-787.20) intermittent solid food dysphagia due to stricture. Chronic intermittent liquid dysphagia due to dysmotility. Currently stable.  Plans: #1. Continue PPI #2. Repeat esophageal dilation for recurrent  solid food dysphagia p.r.n.  Problem # 3:  IBS (ICD-564.1) stable  Patient Instructions: 1)  Refill Pantoprazole #60 x 11 RFs sent to pharmacy for patient to pick up. 2)  Please schedule a follow-up appointment in 1 year. 3)  The medication list was reviewed and reconciled.  All changed / newly prescribed medications were explained.  A complete medication list was provided to the patient / caregiver. 4)  Copy: Dr. Merri Brunette Prescriptions: PANTOPRAZOLE SODIUM 40 MG TBEC (PANTOPRAZOLE SODIUM) 1 tablet by mouth two times a day  #60 x 11   Entered by:   Milford Cage NCMA   Authorized by:   Hilarie Fredrickson MD   Signed by:   Milford Cage NCMA on 03/10/2009   Method used:   Electronically to        [facility] [phone]* (retail)       [address] [place], [place]  [address]       Ph: [phone] or [phone]       Fax: [phone]   RxID:   [id number]

## 2010-02-12 NOTE — Letter (Signed)
Summary: [facility]  [facility]   Imported By: Sherian Rein 03/11/2009 09:20:29  _____________________________________________________________________  External Attachment:    Type:   Image     Comment:   External Document

## 2010-02-12 NOTE — Procedures (Signed)
Summary: EGD/[facility]  EGD/[facility]   Imported By: Sherian Rein 03/11/2009 09:03:37  _____________________________________________________________________  External Attachment:    Type:   Image     Comment:   External Document

## 2010-02-12 NOTE — Procedures (Signed)
Summary: Instructions for procedure/[facility]  Instructions for procedure/   Imported By: Sherian Rein 12/15/2009 15:04:41  _____________________________________________________________________  External Attachment:    Type:   Image     Comment:   External Document

## 2010-02-12 NOTE — Procedures (Signed)
Summary: [employer]   Imported By: Sherian Rein 03/11/2009 09:05:17  _____________________________________________________________________  External Attachment:    Type:   Image     Comment:   External Document

## 2010-02-12 NOTE — Procedures (Signed)
Summary: Upper Endoscopy  Patient: [name] Note: All result statuses are Final unless otherwise noted.  Tests: (1) Upper Endoscopy (EGD)   EGD Upper Endoscopy       DONE     [facility]     [address]     [place], [place]  [address]           ENDOSCOPY PROCEDURE REPORT           PATIENT:  [name], [name]  MR#:  [id number]     BIRTHDATE:  [birth year], 63 yrs. old  GENDER:  female           ENDOSCOPIST:  Wilhemina Bonito. Eda Keys, MD     Referred by:  Office           PROCEDURE DATE:  04/17/2009     PROCEDURE:  EGD, diagnostic,     Maloney Dilation of Esophagus -34F     ASA CLASS:  Class II     INDICATIONS:  dysphagia, dilation of esophageal stricture           MEDICATIONS:   Fentanyl 75 mcg IV, Versed 7 mg IV     TOPICAL ANESTHETIC:  Exactacain Spray           DESCRIPTION OF PROCEDURE:   After the risks benefits and     alternatives of the procedure were thoroughly explained, informed     consent was obtained.  The LB-GIF-H180 [id number] endoscope was     introduced through the mouth and advanced to the second portion of     the duodenum, without limitations.  The instrument was slowly     withdrawn as the mucosa was fully examined.     <<PROCEDUREIMAGES>>           A large caliber ring-like stricture was found in the distal     esophagus.  There were multiple fundic gland type polyps     identified in the fundus/body.  Otherwise the examination was     normal to D2.    Retroflexed views revealed a small hiatal hernia.     The scope was then withdrawn from the patient and the procedure     completed.           THERAPY: 34F MALONEY DILATION W/O RESISTANCE RO HEME. TOLERATED     WELL           COMPLICATIONS:  None           ENDOSCOPIC IMPRESSION:     1) Stricture in the distal esophagus - S/P DILATION     2) Polyps, multiple in the fundus     3) Otherwise normal examination     4) Gerd     RECOMMENDATIONS:     1) continue current medications for GERD     2) Clear  liquids until 5 PM, then soft foods rest of day. Resume     prior diet tomorrow.           ______________________________     Wilhemina Bonito. Eda Keys, MD           CC:  Merri Brunette, MD, The Patient           n.     eSIGNED:   Wilhemina Bonito. Eda Keys at 04/17/2009 03:16 PM           Lady Saucier, [phone]  Note: An exclamation mark (!) indicates a result that was not dispersed into the flowsheet. Document Creation  Date: 04/17/2009 3:17 PM _______________________________________________________________________  (1) Order result status: Final Collection or observation date-time: 04/17/2009 15:08 Requested date-time:  Receipt date-time:  Reported date-time:  Referring Physician:   Ordering Physician: Fransico Setters [phone]) Specimen Source:  Source: Launa Grill Order Number: [id number] Lab site:

## 2010-02-12 NOTE — Procedures (Signed)
Summary: Flexible Sigmoidoscopy/[facility]  Flexible Sigmoidoscopy/[facility]   Imported By: Sherian Rein 03/11/2009 09:14:46  _____________________________________________________________________  External Attachment:    Type:   Image     Comment:   External Document

## 2010-02-12 NOTE — Assessment & Plan Note (Signed)
Summary: post M.I. HGB. 9.8 at [facility] office todayEalge Fa...    History of Present Illness Primary GI MD: Yancey Flemings MD Primary Provider: Severiano Gilbert, MD Requesting Provider: Merri Brunette, MD Chief Complaint: Post hosp MI with new Dx of anemia. Pt feels fatigued and tired all the time. Pt's Hgb from the hosp was 9.6. Denies any other GI sx. History of Present Illness:   Patient known to Dr. Marina Goodell for history of GERD complicated by peptic stricture and IBS. She had a normal colonoscopy by Dr. Marina Goodell for abdominal pain in 2005. Patient has multiple medical problems and is s/p acute MI with drug eluting stent to RCA on 11/12/1l. She is referred here for evaluation of anemia. In October 2010 patient's hemoglobin was 11.6.  Upon recent hospital admission 11/22/09 hemoglobin was 9.6. Patient denies prior history of anemia. No black stools, rectal bleeding, abdominal pain, or weight loss. She hasn't taken any NSAIDS except for the ASA which was started in the hospital.   GI Review of Systems      Denies abdominal pain, acid reflux, belching, bloating, chest pain, dysphagia with liquids, dysphagia with solids, heartburn, loss of appetite, vomiting, vomiting blood, weight loss, and  weight gain.        Denies anal fissure, black tarry stools, change in bowel habit, constipation, diarrhea, diverticulosis, fecal incontinence, heme positive stool, hemorrhoids, irritable bowel syndrome, jaundice, light color stool, liver problems, rectal bleeding, and  rectal pain.    Current Medications (verified): 1)  Pantoprazole Sodium 40 Mg Tbec (Pantoprazole Sodium) .Marland Kitchen.. 1 Tablet By Mouth Two Times A Day 2)  Levoxyl 100 Mcg Tabs (Levothyroxine Sodium) .... One Tablet By Mouth Once Daily 3)  Calcitriol 0.5 Mcg Caps (Calcitriol) .... One Capsule By Mouth Once Daily 4)  Toprol Xl 25 Mg Xr24h-Tab (Metoprolol Succinate) .... 1/2 Tablets By Mouth Once Daily 5)  Doxazosin Mesylate 4 Mg Tabs (Doxazosin  Mesylate) .... Take 1/2 Tab Every Eveining 6)  Multivitamins  Tabs (Multiple Vitamin) .... Once Daily 7)  Calcium-Magnesium 250-125 Mg Tabs (Calcium-Magnesium) .... Take One By Mouth Once Daily 8)  Vitamin E 400 Unit Caps (Vitamin E) .... Take 2 Daily 9)  Tylenol Pm Extra Strength 500-25 Mg Tabs (Diphenhydramine-Apap (Sleep)) .... Take One By Mouth At Bedtime 10)  Tylenol Extra Strength 500 Mg Tabs (Acetaminophen) .... Take Two By Mouth At Bedtime 11)  K-Lor 20 Meq Pack (Potassium Chloride) .... Take Two By Mouth Once Daily 12)  Bupropion Hcl 150 Mg Xr12h-Tab (Bupropion Hcl) .... Once Daily 13)  Tramadol Hcl 50 Mg Tabs (Tramadol Hcl) .... As Needed 14)  Keppra 500 Mg Tabs (Levetiracetam) .... One Tablet By Mouth Every 12 Hours 15)  Plavix 75 Mg Tabs (Clopidogrel Bisulfate) .... One Tablet By Mouth Once Daily 16)  Nitrostat 0.4 Mg Subl (Nitroglycerin) .... One Tablet By Mouth Under Tongue For Chest Pain 17)  Aspirin 325 Mg  Tabs (Aspirin) .... One Tablet By Mouth Once Daily 18)  Crestor 10 Mg Tabs (Rosuvastatin Calcium) .... One Tablet By Mouth Once Daily 19)  Cardizem La 240 Mg Xr24h-Tab (Diltiazem Hcl Coated Beads) .... One Tablet By Mouth Once Daily 20)  Losartan Potassium-Hctz 100-25 Mg Tabs (Losartan Potassium-Hctz) .... One Tablet By Mouth Once Daily 21)  Tramadol Hcl 50 Mg Tabs (Tramadol Hcl) .... One Tablet By Mouth As Needed 22)  Cymbalta 60 Mg Cpep (Duloxetine Hcl) .... One Tablet By Mouth Once Daily  Allergies (verified): No Known Drug Allergies  Past History:  Past  Medical History: Current Problems:  IBS (ICD-564.1) GERD (ICD-530.81) DYSPHAGIA UNSPECIFIED (ICD-787.20) Hypertension Depression Hypothyroidism Myocardial Infarction 11/2009 Seizures  Past Surgical History: right breast surgery X 2 years Drug-eluting stent with Ion stents to the distal right coronary artery 11/2009  Family History: Reviewed history from 03/10/2009 and no changes required. Family History  of Breast Cancer: sister, Neice Family History of Heart Disease: father No FH of Colon Cancer:  Social History: Married Patient is a former smoker.  Alcohol Use - no Daily Caffeine Use coffee Illicit Drug Use - no  Review of Systems       The patient complains of muscle pains/cramps.  The patient denies allergy/sinus, anemia, anxiety-new, arthritis/joint pain, back pain, blood in urine, breast changes/lumps, change in vision, confusion, cough, coughing up blood, depression-new, fainting, fatigue, fever, headaches-new, hearing problems, heart murmur, heart rhythm changes, itching, menstrual pain, night sweats, nosebleeds, pregnancy symptoms, shortness of breath, skin rash, sleeping problems, sore throat, swollen lymph glands, thirst - excessive , urination - excessive , urination changes/pain, urine leakage, vision changes, and voice change.    Vital Signs:  Patient profile:   65 year old female Height:      59 inches Weight:      129.25 pounds BMI:     26.20 Pulse rate:   88 / minute Pulse rhythm:   regular BP sitting:   152 / 82  (left arm) Cuff size:   regular  Vitals Entered By: Christie Nottingham CMA Duncan Dull) (December 12, 2009 8:39 AM)  Physical Exam  General:  Pale, white female, well nourished, no acute distress. Head:  Normocephalic and atraumatic. Eyes:  Conjunctiva pale. No icterus Neck:  no obvious masses  Lungs:  Clear throughout to auscultation. Heart:  RRR. Abdomen:  Abdomen soft, nontender, nondistended. No obvious masses or hepatomegaly.Normal bowel sounds.  Rectal:  No external or internal lesion. Scant light brown heme negative stool.  Msk:  Symmetrical with no gross deformities. Normal posture. Extremities:  No palmar erythema, no edema.  Neurologic:  Alert and  oriented x4;  grossly normal neurologically. Skin:  Intact without significant lesions or rashes. Cervical Nodes:  No significant cervical adenopathy. Psych:  Alert and cooperative. Normal mood and  affect.   Impression & Recommendations:  Problem # 1:  ANEMIA-UNSPECIFIED (ICD-285.9) Normocytic anemia. Two gram drop in hemoglobin over the last year (11.6g/dl to 6.2Z/HY) without overt GI blood loss. Decline in hgb. predates initiation of Plavix and ASA started 11/22/09. Patient had labs 3 days ago and those results have been requested. For further evaluation the patient will be scheduled for an EGD and colonoscopy with biopsies (if indicated).  Patient had cardiac stent placed less than 3 weeks ago so procedures will be done on Plavix. The risks and benefits of the procedures, as well as alternatives were discussed with the patient and her husband and patient agrees to proceed.  Orders: EGD and colonoscopy on Plavix  Colon/Endo (Colon/Endo)  Problem # 2:  CORONARY ARTERY DISEASE (ICD-414.00) Assessment: Comment Only Status post NSTEMI with drug-eluting stent placement to the right coronary artery 11/22/09.  Patient Instructions: 1)  We scheduled the Endoscopy/Colonoscopy with Dr. Marina Goodell.  2)  Directions and brochure provided. 3)  We sent the prescription for the prep you will be drinking for the procedures to your pharmacy, [facility] [address]  4)  Copy sent to : Dr. Merri Brunette 5)  The medication list was reviewed and reconciled.  All changed / newly prescribed medications were explained.  A complete medication list was provided to the patient / caregiver. Prescriptions: MOVIPREP 100 GM  SOLR (PEG-KCL-NACL-NASULF-NA ASC-C) As per prep instructions.  #1 x 0   Entered by:   Lowry Ram NCMA   Authorized by:   Willette Cluster NP   Signed by:   Lowry Ram NCMA on 12/12/2009   Method used:   Electronically to        [facility] [phone]* (retail)       [address] [place], [place]  [address]       Ph: [phone] or [phone]       Fax: [phone]   RxID:   [id number]

## 2010-02-12 NOTE — Procedures (Signed)
Summary: EGD/[facility]  EGD/[facility]   Imported By: Sherian Rein 03/11/2009 09:21:46  _____________________________________________________________________  External Attachment:    Type:   Image     Comment:   External Document

## 2010-02-12 NOTE — Letter (Signed)
Summary: EGD Instructions  [facility]  [address] [place], [place] [address]   Phone: [phone]  Fax: [phone]       [name]    Jun 07, 1945    MRN: [id number]       Procedure Day /Date:THURSDAY, 04/17/09     Arrival Time: 1:30 PM     Procedure Time:2:30 PM     Location of Procedure:                    [facility] (4th Floor)   PREPARATION FOR ENDOSCOPY WITH DILT.   On THURSDAY, 04/17/09 THE DAY OF THE PROCEDURE:  1.   No solid foods, milk or milk products are allowed after midnight the night before your procedure.  2.   Do not drink anything colored red or purple.  Avoid juices with pulp.  No orange juice.  3.  You may drink clear liquids until 12:30 PM, which is 2 hours before your procedure.                                                                                                CLEAR LIQUIDS INCLUDE: Water Jello Ice Popsicles Tea (sugar ok, no milk/cream) Powdered fruit flavored drinks Coffee (sugar ok, no milk/cream) Gatorade Juice: apple, white grape, white cranberry  Lemonade Clear bullion, consomm, broth Carbonated beverages (any kind) Strained chicken noodle soup Hard Candy   MEDICATION INSTRUCTIONS  Unless otherwise instructed, you should take regular prescription medications with a small sip of water as early as possible the morning of your procedure.            OTHER INSTRUCTIONS  You will need a responsible adult at least 64 years of age to accompany you and drive you home.   This person must remain in the waiting room during your procedure.  Wear loose fitting clothing that is easily removed.  Leave jewelry and other valuables at home.  However, you may wish to bring a book to read or an iPod/MP3 player to listen to music as you wait for your procedure to start.  Remove all body piercing jewelry and leave at home.  Total time from sign-in until discharge is approximately 2-3 hours.  You  should go home directly after your procedure and rest.  You can resume normal activities the day after your procedure.  The day of your procedure you should not:   Drive   Make legal decisions   Operate machinery   Drink alcohol   Return to work  You will receive specific instructions about eating, activities and medications before you leave.    The above instructions have been reviewed and explained to me by   _______________________    I fully understand and can verbalize these instructions _____________________________ Date _________

## 2010-02-12 NOTE — Procedures (Signed)
Summary: Colonoscopy  Patient: [name] Note: All result statuses are Final unless otherwise noted.  Tests: (1) Colonoscopy (COL)   COL Colonoscopy           DONE     [facility]     [address]     [place], [place]  [address]           COLONOSCOPY PROCEDURE REPORT           PATIENT:  [name], [name]  MR#:  [id number]     BIRTHDATE:  [birth year], 64 yrs. old  GENDER:  female     ENDOSCOPIST:  Wilhemina Bonito. Eda Keys, MD     REF. BY:  Merri Brunette, M.D.     PROCEDURE DATE:  12/18/2009     PROCEDURE:  Diagnostic Colonoscopy     ASA CLASS:  Class III     INDICATIONS:  Anemia ; HEME NEGATIVE W/O HX OF BLEEDING     MEDICATIONS:   Fentanyl 100 mcg IV, Versed 12 mg IV, Benadryl 25     mg IV           DESCRIPTION OF PROCEDURE:   After the risks benefits and     alternatives of the procedure were thoroughly explained, informed     consent was obtained.  Digital rectal exam was performed and     revealed no abnormalities.   The LB CF-H180AL [id number] endoscope     was introduced through the anus and advanced to the cecum, which     was identified by both the appendix and ileocecal valve, without     limitations.Time to cecum = 3:13 min.  The quality of the prep was     excellent, using MoviPrep.  The instrument was then slowly     withdrawn (time = 7:07 min) as the colon was fully examined.     <<PROCEDUREIMAGES>>           FINDINGS:  The terminal ileum appeared normal.  A normal appearing     cecum, ileocecal valve, and appendiceal orifice were identified.     The ascending, hepatic flexure, transverse, splenic flexure,     descending, sigmoid colon, and rectum appeared unremarkable.     Retroflexed views in the rectum revealed internal hemorrhoids.     The scope was then withdrawn from the patient and the procedure     completed.           COMPLICATIONS:  None           ENDOSCOPIC IMPRESSION:     1) Normal terminal ileum     2) Normal colon     3) Internal hemorrhoids  RECOMMENDATIONS:     1) Continue current colorectal screening recommendations for     "routine risk" patients with a repeat colonoscopy in 10 years.     2) EGD today (see that report for final recommendations)           ______________________________     Wilhemina Bonito. Eda Keys, MD           CC:  Merri Brunette, MD; The Patient; Armanda Magic MD           n.     Rosalie DoctorWilhemina Bonito. Eda Keys at 12/18/2009 11:39 AM           Lady Saucier, [id number]  Note: An exclamation mark (!) indicates a result that was not dispersed into the flowsheet. Document Creation Date: 12/18/2009 11:40 AM  _______________________________________________________________________  (1) Order result status: Final Collection or observation date-time: 12/18/2009 11:33 Requested date-time:  Receipt date-time:  Reported date-time:  Referring Physician:   Ordering Physician: Fransico Setters [phone]) Specimen Source:  Source: Launa Grill Order Number: [id number] Lab site:   Appended Document: Colonoscopy    Clinical Lists Changes  Observations: Added new observation of COLONNXTDUE: 12/2019 (12/18/2009 12:55)

## 2010-02-12 NOTE — Discharge Summary (Signed)
Summary: Discharge Summary    NAME:  [name], [name]                ACCOUNT NO.:  [id number]      MEDICAL RECORD NO.:  [id number]          PATIENT TYPE:  INP      LOCATION:  2030                         FACILITY:  [facility]      PHYSICIAN:  Armanda Magic, M.D.     DATE OF BIRTH:  Jun 24, 1945      DATE OF ADMISSION:  11/22/2009   DATE OF DISCHARGE:  11/25/2009                                  DISCHARGE SUMMARY         ADMISSION DIAGNOSES:   1. Chest pain.   2. Acute non-ST elevation myocardial infarction.   3. History of supraventricular tachycardia.   4. Seizure disorder.   5. Hypertension.   6. Depression.   7. Hypothyroidism.      DISCHARGE DIAGNOSES:   1. Acute inferior posterior lateral myocardial infarction, status post       drug-eluting stent placement to the right coronary artery.   2. Hypertension, controlled.   3. Seizure disorder.   4. Hypothyroidism with over supplementation on Levoxyl with dose       adjusted.   5. Depression.   6. Dyslipidemia.      HISTORY OF PRESENT ILLNESS:  This is a very pleasant 65 year old female   with a history of SVT and seizure disorder, who presented to the   emergency room with complaints of substernal chest pain, intermittent   throughout the day, and on arrival of the EMS, she was diagnosed with ST   depression in 1 and aVL and J-point elevation in V4 through V6 and was   considered ST-elevation MI, although there was no real true ST   elevation.  She did present to the emergency, was transferred to the   cardiac catheterization laboratory and emergent cardiac catheterization.   This revealed a patent left circumflex, patent left main, and a patent   ramus intermediate with a 25% mid stenosis of the LAD and right coronary   artery with diffuse 50% calcified stenosis in the midvessel and 95%   lesion with TIMI 3 flow in the distal RCA.  LV function showed normal LV   function with EF 60% with inferior basal hypokinesis.  She underwent     drug-eluting stent placement with an Ion stents to the distal right   coronary artery and did well postop.  She was started on aspirin and   Plavix.  She was also started on Lipitor 20 mg daily.  She had an   uneventful hospital course with no arrhythmias.  She was noted during   her hospital stay to have hyperthyroidism with a suppressed TSH of 0.89   and her thyroid medicine was adjusted.  Her cardiac enzymes peaked at a   total creatine kinase of 289 with CPK-MB 39.5, troponin 6.45, and   trended down from there.  On the day of discharge, she was in stable   condition without any chest pain, ambulated cardiac rehab without any   problems.  Her labs during her hospital stay showed hemoglobin 5.7,   total  cholesterol 177, triglycerides 45, HDL 78, LDL 90.  Sodium 136,   potassium 4, chloride 106, bicarb 27, BUN 13, creatinine 1.12.  LFTs   normal.  Albumin 3.2.  White cell count 10.0, hemoglobin 9.6, hematocrit   28.8, platelet count 274.  Chest x-ray on admission revealed mild   cardiomegaly without acute findings.      Her discharge medications include:   1. Aspirin 325 mg daily.   2. Lipitor 20 mg daily.   3. Plavix 75 mg daily.   4. Toprol-XL 25 mg 1/2 tablet daily.   5. Levoxyl was decreased to 100 mcg daily.   6. Calcitriol 0.5 mcg daily.   7. Calcium over the counter daily.   8. Cardizem CD 240 mg daily.   9. Cymbalta 60 mg daily.   10.Doxazosin 4 mg 1/2 tablet daily.   11.Hydrocodone/APAP 5/500 mg every 6 hours as needed for pain.   12.Klor-Con 20 mEq 2 tablets daily.   13.Levetiracetam 500 mg b.i.d.   14.Losartan/hydrochlorothiazide 100/25 mg daily.   15.Protonix 40 mg twice daily.   16.Tramadol 50 mg twice daily as needed for pain.   17.Tylenol PM over the counter 1 at bedtime as needed.   18.Tylenol extra strength 500 mg capsules daily as needed for pain.   19.Vitamin E 1 tablet daily.   20.Sublingual nitroglycerin 0.4 mg tablets take as directed for chest        pain.      FOLLOWUP:  The patient will follow up in my office with my nurse   practitioner on December 09, 2009 at 10 a.m.  She will also have labs   drawn on January 06, 2010 for a complete metabolic panel with fasting   lipid panel and a TSH.               Armanda Magic, M.D.               TT/MEDQ  D:  11/25/2009  T:  11/26/2009  Job:  [id number]      cc:   Dario Guardian, M.D.      Electronically Signed by Armanda Magic M.D. on 12/01/2009 02:48:33 PM

## 2010-02-12 NOTE — Letter (Signed)
Summary: Merri Brunette MD/[facility]  Merri Brunette MD/[facility]   Imported By: Lester Oakmont 01/07/2010 07:38:19  _____________________________________________________________________  External Attachment:    Type:   Image     Comment:   External Document

## 2010-02-13 ENCOUNTER — Encounter (HOSPITAL_COMMUNITY): Payer: BC Managed Care – PPO

## 2010-02-16 ENCOUNTER — Encounter (HOSPITAL_COMMUNITY): Payer: BC Managed Care – PPO

## 2010-02-18 ENCOUNTER — Encounter (HOSPITAL_COMMUNITY): Payer: BC Managed Care – PPO

## 2010-02-20 ENCOUNTER — Encounter (HOSPITAL_COMMUNITY): Payer: BC Managed Care – PPO

## 2010-02-23 ENCOUNTER — Encounter (HOSPITAL_COMMUNITY): Payer: BC Managed Care – PPO

## 2010-02-24 ENCOUNTER — Encounter (HOSPITAL_BASED_OUTPATIENT_CLINIC_OR_DEPARTMENT_OTHER): Payer: BC Managed Care – PPO | Admitting: Oncology

## 2010-02-24 DIAGNOSIS — D649 Anemia, unspecified: Secondary | ICD-10-CM

## 2010-02-25 ENCOUNTER — Encounter (HOSPITAL_COMMUNITY): Payer: BC Managed Care – PPO

## 2010-02-26 ENCOUNTER — Encounter (HOSPITAL_BASED_OUTPATIENT_CLINIC_OR_DEPARTMENT_OTHER): Payer: BC Managed Care – PPO | Admitting: Oncology

## 2010-02-26 DIAGNOSIS — D649 Anemia, unspecified: Secondary | ICD-10-CM

## 2010-02-27 ENCOUNTER — Encounter (HOSPITAL_COMMUNITY): Payer: BC Managed Care – PPO

## 2010-03-02 ENCOUNTER — Encounter (HOSPITAL_COMMUNITY): Payer: BC Managed Care – PPO

## 2010-03-04 ENCOUNTER — Encounter (HOSPITAL_COMMUNITY): Payer: BC Managed Care – PPO

## 2010-03-05 ENCOUNTER — Encounter (HOSPITAL_BASED_OUTPATIENT_CLINIC_OR_DEPARTMENT_OTHER): Payer: BC Managed Care – PPO | Admitting: Oncology

## 2010-03-05 DIAGNOSIS — D649 Anemia, unspecified: Secondary | ICD-10-CM

## 2010-03-06 ENCOUNTER — Encounter (HOSPITAL_COMMUNITY): Payer: BC Managed Care – PPO

## 2010-03-09 ENCOUNTER — Encounter: Payer: Self-pay | Admitting: Internal Medicine

## 2010-03-09 ENCOUNTER — Ambulatory Visit (INDEPENDENT_AMBULATORY_CARE_PROVIDER_SITE_OTHER): Payer: BC Managed Care – PPO | Admitting: Internal Medicine

## 2010-03-09 ENCOUNTER — Encounter (HOSPITAL_COMMUNITY): Payer: BC Managed Care – PPO

## 2010-03-09 DIAGNOSIS — K219 Gastro-esophageal reflux disease without esophagitis: Secondary | ICD-10-CM

## 2010-03-10 NOTE — Procedures (Signed)
Summary: EGD   EGD  Procedure date:  01/20/2005  Findings:      Location: [facility]  Findings: Stricture:  GERD Patient Name: Izadora, Roehr. MRN:  Procedure Procedures: Panendoscopy (EGD) CPT: 43235.    with esophageal dilation. CPT: G9296129.  Personnel: Endoscopist: Wilhemina Bonito. Marina Goodell, MD.  Exam Location: Exam performed in Endoscopy Suite.  Patient Consent: Procedure, Alternatives, Risks and Benefits discussed, consent obtained, from patient. Consent was obtained by the RN.  Indications  Therapeutics: Reason for exam: Esophageal dilation.  Symptoms: Dysphagia.  History  Current Medications: Patient is not currently taking Coumadin.  Comments: Patient history reviewed/updated, physical exam performed prior to initiation of sedation? Pre-Exam Physical: Performed Jan 20, 2005  Cardio-pulmonary exam, Abdominal exam, Mental status exam WNL.  Comments: Pt. history reviewed/updated, physical exam performed prior to initiation of sedation? yes Exam Exam Info: Maximum depth of insertion Duodenum, intended Duodenum. Patient position: on left side. Vocal cords visualized. Gastric retroflexion performed. Images taken. ASA Classification: II. Tolerance: excellent.  Sedation Meds: Patient assessed and found to be appropriate for moderate (conscious) sedation. Fentanyl 80 given IV. Versed 8 mg. given IV. Cetacaine Spray given aerosolized.  Monitoring: BP and pulse monitoring done. Oximetry used. Supplemental O2 given  Fluoroscopy: Fluoroscopy was used.  Findings - Normal: Proximal Esophagus to Distal Esophagus. Comments: No appreciable resistance at UES.  STRICTURE / STENOSIS: Stricture in Distal Esophagus.  Constriction: partial. Etiology: benign due to reflux. 39 cm from mouth. Lumen diameter is 16 mm. ICD9: Esophageal Stricture: 530.3.  - Dilation: Distal Esophagus. Procedure was performed under Fluoroscopy. Wire Guided/Savary (Wilson-Cook) dilator used,  Diameter: 18 mm, No Resistance, No Heme present on extraction. 1  total dilators used. Patient tolerance excellent.  POLYP: in Body. Comments: multiple small (<65mm) benign fundic gland appearring polyps as previous.    Comments: Otherwise normal EGD to d3 Assessment Abnormal examination, see findings above.  Diagnoses: 530.3: Esophageal Stricture.  530.81: GERD.   Events  Unplanned Intervention: No unplanned interventions were required.  Unplanned Events: There were no complications. Plans Instructions: Nothing to eat or drink for 2 hrs.  Clear or full liquids: 2 hrs. Resume previous diet: am.  Medication(s): Continue current medications.  Disposition: After procedure patient sent to recovery. After recovery patient sent home.  Scheduling: Follow-up prn.   This report was created from the original endoscopy report, which was reviewed and signed by the above listed endoscopist.   cc:  Ellamae Sia, MD      The Patient

## 2010-03-10 NOTE — Procedures (Signed)
Summary: EGD   EGD  Procedure date:  02/28/2004  Findings:      Location: [facility]  Findings: Stricture:  GERD  EGD  Procedure date:  02/28/2004  Findings:      Location: [facility]  Findings: Stricture:  GERD Patient Name: [name], [name]. MRN:  Procedure Procedures: Panendoscopy (EGD) CPT: 43235.    with esophageal dilation. CPT: G9296129.  Personnel: Endoscopist: Wilhemina Bonito. Marina Goodell, MD.  Exam Location: Exam performed in Endoscopy Suite.  Patient Consent: Procedure, Alternatives, Risks and Benefits discussed, consent obtained,  Indications  Therapeutics: Reason for exam: Esophageal dilation.  Symptoms: Dysphagia.  History  Current Medications: Patient is not currently taking Coumadin.  Pre-Exam Physical: Performed Oct 02, 2003  Entire physical exam was normal.  Exam Exam Info: Maximum depth of insertion Duodenum, intended Duodenum. Patient position: on left side. Vocal cords visualized. Gastric retroflexion performed. Images taken. ASA Classification: II. Tolerance: excellent.  Sedation Meds: Demerol 80 mg. given IV. Versed 9 mg. given IV.  Monitoring: BP and pulse monitoring done. Oximetry used. Supplemental O2 given  Fluoroscopy: Fluoroscopy was used.  Findings STRICTURE / STENOSIS: Stricture in Distal Esophagus.  Constriction: partial. Etiology: benign due to reflux. 39 cm from mouth. Lumen diameter is 15 mm. ICD9: Esophageal Stricture: 530.3.  - Dilation: Distal Esophagus. Procedure was performed under Fluoroscopy. Wire Guided/Savary (Wilson-Cook) dilator used, Diameter: 18 mm, No Resistance, No Heme present on extraction. 1  total dilators used. Patient tolerance excellent.  - POLYP: in Fundus. ICD9: . Comments: multiple fundic gland polyps as previous.   Assessment Normal examination.  Diagnoses: 530.3: Esophageal Stricture.  .  530.81: GERD.   Events  Unplanned Intervention: No unplanned interventions were required.    Unplanned Events: There were no complications. Plans Instructions: Nothing to eat or drink for 2 hrs.  Clear or full liquids: 2 hrs. Resume previous diet: 4 hrs.  Medication(s): Continue current medications.  Disposition: After procedure patient sent to recovery.  Scheduling: Follow-up prn.   This report was created from the original endoscopy report, which was reviewed and signed by the above listed endoscopist.   cc:  The Patient

## 2010-03-11 ENCOUNTER — Encounter (HOSPITAL_COMMUNITY): Payer: BC Managed Care – PPO

## 2010-03-13 ENCOUNTER — Encounter (HOSPITAL_COMMUNITY): Payer: BC Managed Care – PPO

## 2010-03-16 ENCOUNTER — Encounter (HOSPITAL_COMMUNITY): Payer: BC Managed Care – PPO | Attending: Cardiology

## 2010-03-16 DIAGNOSIS — I2510 Atherosclerotic heart disease of native coronary artery without angina pectoris: Secondary | ICD-10-CM | POA: Insufficient documentation

## 2010-03-16 DIAGNOSIS — I10 Essential (primary) hypertension: Secondary | ICD-10-CM | POA: Insufficient documentation

## 2010-03-16 DIAGNOSIS — E785 Hyperlipidemia, unspecified: Secondary | ICD-10-CM | POA: Insufficient documentation

## 2010-03-16 DIAGNOSIS — K219 Gastro-esophageal reflux disease without esophagitis: Secondary | ICD-10-CM | POA: Insufficient documentation

## 2010-03-16 DIAGNOSIS — Z9861 Coronary angioplasty status: Secondary | ICD-10-CM | POA: Insufficient documentation

## 2010-03-16 DIAGNOSIS — I2119 ST elevation (STEMI) myocardial infarction involving other coronary artery of inferior wall: Secondary | ICD-10-CM | POA: Insufficient documentation

## 2010-03-16 DIAGNOSIS — G40909 Epilepsy, unspecified, not intractable, without status epilepticus: Secondary | ICD-10-CM | POA: Insufficient documentation

## 2010-03-16 DIAGNOSIS — E039 Hypothyroidism, unspecified: Secondary | ICD-10-CM | POA: Insufficient documentation

## 2010-03-16 DIAGNOSIS — Z5189 Encounter for other specified aftercare: Secondary | ICD-10-CM | POA: Insufficient documentation

## 2010-03-16 DIAGNOSIS — F3289 Other specified depressive episodes: Secondary | ICD-10-CM | POA: Insufficient documentation

## 2010-03-16 DIAGNOSIS — F329 Major depressive disorder, single episode, unspecified: Secondary | ICD-10-CM | POA: Insufficient documentation

## 2010-03-18 ENCOUNTER — Encounter (HOSPITAL_COMMUNITY): Payer: BC Managed Care – PPO

## 2010-03-19 NOTE — Procedures (Signed)
Summary: 24 Hr pH Study/[facility]  24 Hr pH Study/[facility]   Imported By: Sherian Rein 03/10/2010 09:54:39  _____________________________________________________________________  External Attachment:    Type:   Image     Comment:   External Document

## 2010-03-19 NOTE — Assessment & Plan Note (Signed)
Summary: MED REFILL  and GERD followup    History of Present Illness Visit Type: Follow-up Visit Primary GI MD: Yancey Flemings MD Primary Provider: Severiano Gilbert, MD Requesting Provider: na Chief Complaint: Patient here to follow up on her Genella Rife and gter her Protonix refilled. She denies any GI complaints. She has had two iron infusions over the last 2 weeks.  History of Present Illness:    65 year old female with hypertension, hypothyroidism, coronary artery disease with prior myocardial infarction , GERD, IBS, and dysphagia. she presents now regarding routine GERD followup and requests medication refill. she was last seen in December of 2000 11 for evaluation of Hemoccult negative iron deficiency anemia. upper endoscopy and colonoscopy were unrevealing in this regard. She has since been seen by a hematologist and is on IV iron infusions. In terms of GERD, her symptoms are controlled with pantoprazole 40 mg twice a day. no problems with dysphagia at this time.   GI Review of Systems      Denies abdominal pain, acid reflux, belching, bloating, chest pain, dysphagia with liquids, dysphagia with solids, heartburn, loss of appetite, nausea, vomiting, vomiting blood, weight loss, and  weight gain.        Denies anal fissure, black tarry stools, change in bowel habit, constipation, diarrhea, diverticulosis, fecal incontinence, heme positive stool, hemorrhoids, irritable bowel syndrome, jaundice, light color stool, liver problems, rectal bleeding, and  rectal pain.    Current Medications (verified): 1)  Pantoprazole Sodium 40 Mg Tbec (Pantoprazole Sodium) .Marland Kitchen.. 1 Tablet By Mouth Two Times A Day 2)  Levoxyl 100 Mcg Tabs (Levothyroxine Sodium) .... One Tablet By Mouth Once Daily 3)  Calcitriol 0.5 Mcg Caps (Calcitriol) .... One Capsule By Mouth Once Daily 4)  Toprol Xl 25 Mg Xr24h-Tab (Metoprolol Succinate) .... 1/2 Tablets By Mouth Once Daily 5)  Doxazosin Mesylate 4 Mg Tabs (Doxazosin Mesylate) ....  Take 1  Tab Every Eveining 6)  Calcium-Vitamin D 600-125 Mg-Unit Tabs (Calcium-Vitamin D) .... Take One By Mouth Once Daily 7)  Vitamin E 400 Unit Caps (Vitamin E) .... Take 1 Daily 8)  Tylenol Pm Extra Strength 500-25 Mg Tabs (Diphenhydramine-Apap (Sleep)) .... Take One By Mouth At Bedtime 9)  Tylenol Extra Strength 500 Mg Tabs (Acetaminophen) .... Take Two By Mouth As Needed 10)  K-Lor 20 Meq Pack (Potassium Chloride) .... Take Two By Mouth Once Daily 11)  Keppra 500 Mg Tabs (Levetiracetam) .... One Tablet By Mouth Every 12 Hours 12)  Plavix 75 Mg Tabs (Clopidogrel Bisulfate) .... One Tablet By Mouth Once Daily 13)  Nitrostat 0.4 Mg Subl (Nitroglycerin) .... One Tablet By Mouth Under Tongue For Chest Pain 14)  Aspirin 325 Mg  Tabs (Aspirin) .... One Tablet By Mouth Once Daily 15)  Crestor 10 Mg Tabs (Rosuvastatin Calcium) .... One Tablet By Mouth Once Daily 16)  Cardizem La 240 Mg Xr24h-Tab (Diltiazem Hcl Coated Beads) .... One Tablet By Mouth Once Daily 17)  Losartan Potassium-Hctz 100-25 Mg Tabs (Losartan Potassium-Hctz) .... One Tablet By Mouth Once Daily 18)  Tramadol Hcl 50 Mg Tabs (Tramadol Hcl) .... One Tablet By Mouth As Needed 19)  Cymbalta 60 Mg Cpep (Duloxetine Hcl) .... One Tablet By Mouth Once Daily 20)  Vicodin 5-500 Mg Tabs (Hydrocodone-Acetaminophen) .... Take One By Mouth As Needed  Allergies (verified): No Known Drug Allergies  Past History:  Past Medical History: Current Problems:  IBS (ICD-564.1) GERD (ICD-530.81) DYSPHAGIA UNSPECIFIED (ICD-787.20) Hypertension Depression Hypothyroidism Myocardial Infarction 11/2009 Seizures iron def anemia  Past Surgical History:  Reviewed history from 12/12/2009 and no changes required. right breast surgery X 2 years Drug-eluting stent with Ion stents to the distal right coronary artery 11/2009  Family History: Reviewed history from 12/12/2009 and no changes required. Family History of Breast Cancer: sister,  Neice Family History of Heart Disease: father No FH of Colon Cancer:  Social History: Reviewed history from 12/12/2009 and no changes required. Married Patient is a former smoker.  Alcohol Use - no Daily Caffeine Use coffee Illicit Drug Use - no  Review of Systems       The patient complains of allergy/sinus, anemia, anxiety-new, arthritis/joint pain, back pain, fatigue, muscle pains/cramps, shortness of breath, and sleeping problems.  The patient denies blood in urine, breast changes/lumps, change in vision, confusion, cough, coughing up blood, depression-new, fainting, fever, headaches-new, hearing problems, heart murmur, heart rhythm changes, itching, menstrual pain, night sweats, nosebleeds, pregnancy symptoms, skin rash, sore throat, swelling of feet/legs, swollen lymph glands, thirst - excessive , urination - excessive , urination changes/pain, urine leakage, vision changes, and voice change.    Vital Signs:  Patient profile:   66 year old female Height:      59 inches Weight:      136.8 pounds BMI:     27.73 Pulse rate:   76 / minute Pulse rhythm:   regular BP sitting:   110 / 64  (left arm) Cuff size:   regular  Vitals Entered By: Harlow Mares CMA Duncan Dull) (March 09, 2010 1:37 PM)  Physical Exam  General:  Well developed, well nourished, no acute distress. Head:  Normocephalic and atraumatic. Eyes:  PERRLA, no icterus. Mouth:  No deformity or lesions Neck:  Supple; no masses or thyromegaly. Lungs:  Clear throughout to auscultation. Heart:  Regular rate and rhythm; no murmurs, rubs,  or bruits. Abdomen:  Soft, nontender and nondistended. No masses, hepatosplenomegaly or hernias noted. Normal bowel sounds. Pulses:  Normal pulses noted. Extremities:   no edema Neurologic:  Alert and  oriented x4; Skin:  Intact without significant lesions or rashes. Psych:  Alert and cooperative. Normal mood and affect.   Impression & Recommendations:  Problem # 1:  GERD  (ICD-530.81)  symptoms controlled on twice a day PPI   Plan : #1. Reflux precautions #2. refill pantoprazole 40 mg twice a day  #3. Routine office followup in one year  Problem # 2:  ANEMIA-UNSPECIFIED (ICD-285.9)  recent negative GI evaluation for anemia. Now seening hematology.  Patient Instructions: 1)  Refilled Protonix x 1 year  Rx. sent to your pharmacy. 2)  Please schedule a follow-up appointment in 1 year. 3)  Copy sent to : Severiano Gilbert, MD; Milinda Cave Shada,MD 4)  The medication list was reviewed and reconciled.  All changed / newly prescribed medications were explained.  A complete medication list was provided to the patient / caregiver. Prescriptions: PANTOPRAZOLE SODIUM 40 MG TBEC (PANTOPRAZOLE SODIUM) 1 tablet by mouth two times a day  #60 Tablet x 11   Entered by:   Milford Cage NCMA   Authorized by:   Hilarie Fredrickson MD   Signed by:   Milford Cage NCMA on 03/09/2010   Method used:   Electronically to        [facility] [phone]* (retail)       [address] [place], [place]  [address]       Ph: [phone] or [phone]       Fax: [phone]  RxID:   [id number]

## 2010-03-20 ENCOUNTER — Encounter (HOSPITAL_COMMUNITY): Payer: BC Managed Care – PPO

## 2010-03-23 ENCOUNTER — Encounter (HOSPITAL_COMMUNITY): Payer: BC Managed Care – PPO

## 2010-03-23 LAB — GLUCOSE, CAPILLARY: Glucose-Capillary: 106 mg/dL — ABNORMAL HIGH (ref 70–99)

## 2010-03-24 LAB — COMPREHENSIVE METABOLIC PANEL
ALT: 14 U/L (ref 0–35)
AST: 28 U/L (ref 0–37)
Albumin: 3.2 g/dL — ABNORMAL LOW (ref 3.5–5.2)
Alkaline Phosphatase: 103 U/L (ref 39–117)
BUN: 13 mg/dL (ref 6–23)
Chloride: 106 mEq/L (ref 96–112)
GFR calc Af Amer: 59 mL/min — ABNORMAL LOW (ref >60)
Potassium: 4 mEq/L (ref 3.5–5.1)
Sodium: 141 mEq/L (ref 135–145)
Total Bilirubin: 0.4 mg/dL (ref 0.3–1.2)
Total Protein: 6.1 g/dL (ref 6.0–8.3)

## 2010-03-24 LAB — DIFFERENTIAL
Basophils Absolute: 0 10*3/uL (ref 0.0–0.1)
Basophils Absolute: 0 10*3/uL (ref 0.0–0.1)
Basophils Relative: 0 % (ref 0–1)
Eosinophils Relative: 1 % (ref 0–5)
Lymphocytes Relative: 27 % (ref 12–46)
Monocytes Absolute: 0.9 10*3/uL (ref 0.1–1.0)
Monocytes Absolute: 1 10*3/uL (ref 0.1–1.0)
Monocytes Relative: 11 % (ref 3–12)
Monocytes Relative: 9 % (ref 3–12)
Neutro Abs: 5.6 10*3/uL (ref 1.7–7.7)
Neutro Abs: 6.3 10*3/uL (ref 1.7–7.7)
Neutrophils Relative %: 61 % (ref 43–77)

## 2010-03-24 LAB — CARDIAC PANEL(CRET KIN+CKTOT+MB+TROPI)
CK, MB: 15.6 ng/mL (ref 0.3–4.0)
Relative Index: 11.5 — ABNORMAL HIGH (ref 0.0–2.5)
Relative Index: 12.2 — ABNORMAL HIGH (ref 0.0–2.5)
Relative Index: 13.7 — ABNORMAL HIGH (ref 0.0–2.5)
Total CK: 136 U/L (ref 7–177)
Total CK: 289 U/L — ABNORMAL HIGH (ref 7–177)
Troponin I: 2.53 ng/mL (ref 0.00–0.06)
Troponin I: 6.45 ng/mL (ref 0.00–0.06)

## 2010-03-24 LAB — CBC
HCT: 28.9 % — ABNORMAL LOW (ref 36.0–46.0)
HCT: 33.7 % — ABNORMAL LOW (ref 36.0–46.0)
Hemoglobin: 11.4 g/dL — ABNORMAL LOW (ref 12.0–15.0)
MCV: 86 fL (ref 78.0–100.0)
Platelets: 244 10*3/uL (ref 150–400)
Platelets: 274 10*3/uL (ref 150–400)
RBC: 3.35 MIL/uL — ABNORMAL LOW (ref 3.87–5.11)
RBC: 3.35 MIL/uL — ABNORMAL LOW (ref 3.87–5.11)
RDW: 12.1 % (ref 11.5–15.5)
RDW: 12.4 % (ref 11.5–15.5)
WBC: 10 10*3/uL (ref 4.0–10.5)
WBC: 6.9 10*3/uL (ref 4.0–10.5)
WBC: 9.2 10*3/uL (ref 4.0–10.5)

## 2010-03-24 LAB — PROTIME-INR
INR: 0.94 (ref 0.00–1.49)
Prothrombin Time: 12.8 seconds (ref 11.6–15.2)

## 2010-03-24 LAB — BASIC METABOLIC PANEL
BUN: 13 mg/dL (ref 6–23)
CO2: 25 mEq/L (ref 19–32)
Chloride: 102 mEq/L (ref 96–112)
Chloride: 102 mEq/L (ref 96–112)
GFR calc Af Amer: 53 mL/min — ABNORMAL LOW (ref >60)
Potassium: 3.3 mEq/L — ABNORMAL LOW (ref 3.5–5.1)
Potassium: 3.4 mEq/L — ABNORMAL LOW (ref 3.5–5.1)

## 2010-03-24 LAB — POCT I-STAT, CHEM 8
Creatinine, Ser: 1.2 mg/dL (ref 0.4–1.2)
Glucose, Bld: 103 mg/dL — ABNORMAL HIGH (ref 70–99)
HCT: 35 % — ABNORMAL LOW (ref 36.0–46.0)
Hemoglobin: 11.9 g/dL — ABNORMAL LOW (ref 12.0–15.0)
Potassium: 3.7 mEq/L (ref 3.5–5.1)
Sodium: 140 mEq/L (ref 135–145)
TCO2: 25 mmol/L (ref 0–100)

## 2010-03-24 LAB — LIPID PANEL
HDL: 78 mg/dL (ref >39)
LDL Cholesterol: 90 mg/dL (ref 0–99)
Triglycerides: 45 mg/dL (ref <150)

## 2010-03-24 LAB — PHOSPHORUS: Phosphorus: 2.1 mg/dL — ABNORMAL LOW (ref 2.3–4.6)

## 2010-03-24 LAB — TSH: TSH: 0.089 u[IU]/mL — ABNORMAL LOW (ref 0.350–4.500)

## 2010-03-24 LAB — POCT CARDIAC MARKERS: CKMB, poc: 11.2 ng/mL (ref 1.0–8.0)

## 2010-03-25 ENCOUNTER — Encounter (HOSPITAL_COMMUNITY): Payer: BC Managed Care – PPO

## 2010-03-25 LAB — DIFFERENTIAL
Basophils Absolute: 0 10*3/uL (ref 0.0–0.1)
Basophils Relative: 0 % (ref 0–1)
Eosinophils Absolute: 0 10*3/uL (ref 0.0–0.7)
Monocytes Absolute: 0.9 10*3/uL (ref 0.1–1.0)
Monocytes Relative: 12 % (ref 3–12)
Neutro Abs: 4.7 10*3/uL (ref 1.7–7.7)
Neutrophils Relative %: 64 % (ref 43–77)

## 2010-03-25 LAB — BASIC METABOLIC PANEL WITH GFR
BUN: 16 mg/dL (ref 6–23)
CO2: 24 meq/L (ref 19–32)
Calcium: 10.7 mg/dL — ABNORMAL HIGH (ref 8.4–10.5)
Chloride: 108 meq/L (ref 96–112)
Creatinine, Ser: 1.43 mg/dL — ABNORMAL HIGH (ref 0.4–1.2)
GFR calc non Af Amer: 37 mL/min — ABNORMAL LOW
Glucose, Bld: 99 mg/dL (ref 70–99)
Potassium: 3.8 meq/L (ref 3.5–5.1)
Sodium: 140 meq/L (ref 135–145)

## 2010-03-25 LAB — URINALYSIS, ROUTINE W REFLEX MICROSCOPIC
Bilirubin Urine: NEGATIVE
Glucose, UA: NEGATIVE mg/dL
Hgb urine dipstick: NEGATIVE
Ketones, ur: NEGATIVE mg/dL
Nitrite: NEGATIVE
Protein, ur: NEGATIVE mg/dL
Specific Gravity, Urine: 1.009 (ref 1.005–1.030)
Urobilinogen, UA: 0.2 mg/dL (ref 0.0–1.0)
pH: 7 (ref 5.0–8.0)

## 2010-03-25 LAB — CBC
HCT: 34.9 % — ABNORMAL LOW (ref 36.0–46.0)
Hemoglobin: 11.6 g/dL — ABNORMAL LOW (ref 12.0–15.0)
MCH: 29.3 pg (ref 26.0–34.0)
MCHC: 33.2 g/dL (ref 30.0–36.0)
MCV: 88.1 fL (ref 78.0–100.0)
Platelets: 269 K/uL (ref 150–400)
RBC: 3.96 MIL/uL (ref 3.87–5.11)
RDW: 12.4 % (ref 11.5–15.5)
WBC: 7.3 K/uL (ref 4.0–10.5)

## 2010-03-25 LAB — POCT CARDIAC MARKERS
CKMB, poc: <1 ng/mL — ABNORMAL LOW (ref 1.0–8.0)
Troponin i, poc: <0.05 ng/mL (ref 0.00–0.09)

## 2010-03-27 ENCOUNTER — Encounter (HOSPITAL_COMMUNITY): Payer: BC Managed Care – PPO

## 2010-03-30 ENCOUNTER — Encounter (HOSPITAL_COMMUNITY): Payer: BC Managed Care – PPO

## 2010-04-01 ENCOUNTER — Encounter (HOSPITAL_COMMUNITY): Payer: BC Managed Care – PPO

## 2010-04-03 ENCOUNTER — Encounter (HOSPITAL_COMMUNITY): Payer: BC Managed Care – PPO

## 2010-04-06 ENCOUNTER — Encounter (HOSPITAL_COMMUNITY): Payer: BC Managed Care – PPO

## 2010-04-08 ENCOUNTER — Encounter (HOSPITAL_COMMUNITY): Payer: BC Managed Care – PPO

## 2010-04-09 ENCOUNTER — Other Ambulatory Visit: Payer: Self-pay | Admitting: Oncology

## 2010-04-09 ENCOUNTER — Encounter (HOSPITAL_BASED_OUTPATIENT_CLINIC_OR_DEPARTMENT_OTHER): Payer: BC Managed Care – PPO | Admitting: Oncology

## 2010-04-09 DIAGNOSIS — D649 Anemia, unspecified: Secondary | ICD-10-CM

## 2010-04-09 DIAGNOSIS — D539 Nutritional anemia, unspecified: Secondary | ICD-10-CM

## 2010-04-09 LAB — CBC WITH DIFFERENTIAL/PLATELET
BASO%: 0.6 % (ref 0.0–2.0)
EOS%: 3.1 % (ref 0.0–7.0)
HCT: 33.5 % — ABNORMAL LOW (ref 34.8–46.6)
HGB: 11.3 g/dL — ABNORMAL LOW (ref 11.6–15.9)
MCH: 29.9 pg (ref 25.1–34.0)
MCHC: 33.8 g/dL (ref 31.5–36.0)
MONO#: 0.6 10*3/uL (ref 0.1–0.9)
RDW: 17.1 % — ABNORMAL HIGH (ref 11.2–14.5)
WBC: 4.8 10*3/uL (ref 3.9–10.3)
lymph#: 1.7 10*3/uL (ref 0.9–3.3)

## 2010-04-10 ENCOUNTER — Encounter (HOSPITAL_COMMUNITY): Payer: BC Managed Care – PPO

## 2010-04-13 ENCOUNTER — Encounter (HOSPITAL_COMMUNITY): Payer: BC Managed Care – PPO | Attending: Cardiology

## 2010-04-13 DIAGNOSIS — I10 Essential (primary) hypertension: Secondary | ICD-10-CM | POA: Insufficient documentation

## 2010-04-13 DIAGNOSIS — F3289 Other specified depressive episodes: Secondary | ICD-10-CM | POA: Insufficient documentation

## 2010-04-13 DIAGNOSIS — K219 Gastro-esophageal reflux disease without esophagitis: Secondary | ICD-10-CM | POA: Insufficient documentation

## 2010-04-13 DIAGNOSIS — G40909 Epilepsy, unspecified, not intractable, without status epilepticus: Secondary | ICD-10-CM | POA: Insufficient documentation

## 2010-04-13 DIAGNOSIS — E785 Hyperlipidemia, unspecified: Secondary | ICD-10-CM | POA: Insufficient documentation

## 2010-04-13 DIAGNOSIS — F329 Major depressive disorder, single episode, unspecified: Secondary | ICD-10-CM | POA: Insufficient documentation

## 2010-04-13 DIAGNOSIS — I2119 ST elevation (STEMI) myocardial infarction involving other coronary artery of inferior wall: Secondary | ICD-10-CM | POA: Insufficient documentation

## 2010-04-13 DIAGNOSIS — Z9861 Coronary angioplasty status: Secondary | ICD-10-CM | POA: Insufficient documentation

## 2010-04-13 DIAGNOSIS — I2510 Atherosclerotic heart disease of native coronary artery without angina pectoris: Secondary | ICD-10-CM | POA: Insufficient documentation

## 2010-04-13 DIAGNOSIS — E039 Hypothyroidism, unspecified: Secondary | ICD-10-CM | POA: Insufficient documentation

## 2010-04-13 DIAGNOSIS — Z5189 Encounter for other specified aftercare: Secondary | ICD-10-CM | POA: Insufficient documentation

## 2010-04-15 ENCOUNTER — Encounter (HOSPITAL_COMMUNITY): Payer: BC Managed Care – PPO

## 2010-04-17 ENCOUNTER — Encounter (HOSPITAL_COMMUNITY): Payer: BC Managed Care – PPO

## 2010-04-19 LAB — BASIC METABOLIC PANEL
Chloride: 100 mEq/L (ref 96–112)
GFR calc non Af Amer: 55 mL/min — ABNORMAL LOW (ref >60)
Potassium: 3.6 mEq/L (ref 3.5–5.1)
Sodium: 140 mEq/L (ref 135–145)

## 2010-04-19 LAB — POCT HEMOGLOBIN-HEMACUE: Hemoglobin: 12.5 g/dL (ref 12.0–15.0)

## 2010-04-20 ENCOUNTER — Encounter (HOSPITAL_COMMUNITY): Payer: BC Managed Care – PPO

## 2010-04-22 ENCOUNTER — Encounter (HOSPITAL_COMMUNITY): Payer: BC Managed Care – PPO

## 2010-04-24 ENCOUNTER — Encounter (HOSPITAL_COMMUNITY): Payer: BC Managed Care – PPO

## 2010-04-27 ENCOUNTER — Encounter (HOSPITAL_COMMUNITY): Payer: BC Managed Care – PPO

## 2010-04-29 ENCOUNTER — Encounter (HOSPITAL_COMMUNITY): Payer: BC Managed Care – PPO

## 2010-05-01 ENCOUNTER — Encounter (HOSPITAL_COMMUNITY): Payer: BC Managed Care – PPO

## 2010-05-04 ENCOUNTER — Encounter (HOSPITAL_COMMUNITY): Payer: BC Managed Care – PPO

## 2010-05-26 NOTE — Op Note (Signed)
[name], [name]                ACCOUNT NO.:  [id number]   MEDICAL RECORD NO.:  [id number]          PATIENT TYPE:  AMB   LOCATION:  [facility]                          FACILITY:  [facility]   PHYSICIAN:  Currie Paris, M.D.DATE OF BIRTH:  08-07-1945   DATE OF PROCEDURE:  08/09/2008  DATE OF DISCHARGE:                               OPERATIVE REPORT   PREOPERATIVE DIAGNOSIS:  Calcifications with atypical ductal  hyperplasia, right breast, upper inner quadrant.   POSTOPERATIVE DIAGNOSIS:  Calcifications with atypical ductal  hyperplasia, right breast, upper inner quadrant.   OPERATION:  Needle-guided excision of right breast calcifications.   SURGEON:  Currie Paris, MD   ANESTHESIA:  General.   CLINICAL HISTORY:  This is a 65 year old lady who had a recent biopsy  showing some ADH.  She has had a prior open biopsy for the same sort of  issue several years ago.   DESCRIPTION OF PROCEDURE:  I saw the patient in the holding area and she  had no further questions.  We confirmed excision with some right breast  calcifications was the planned procedure.  I reviewed her guidewire  films and she and her husband had no further questions.   The patient was taken to the operating room and after satisfactory  general anesthesia had been obtained, the right breast was prepped and  draped.  The time-out was done.   The guidewire entered medially and tracked laterally.  I made a  transverse incision directly over the guidewire.  Stopping the right,  had a small raised lesion that I thought it was perhaps an accessory  nipple.  I divided subcutaneous tissues for about a centimeter and  manipulated the wire into the wound.   Using Allis clamps, I took an excisional biopsy around the guidewire  heading from medial to lateral and going down towards the chest wall, so  that I was leaving very little tissue on the chest wall.   When I got, I thought I was well lateral to the tip of the  guidewire, I  tried to divide the breast off of the more lateral portion of the breast  that found that I was not quite past the tip of the guidewire.   At this point, I extended the excision about a centimeter and then I was  able to regrasp it and get a little more tissue, so I was completely  beyond the tip of the guidewire for the excision.  A specimen mammogram  showed the clip to be in the center of the specimen.  It was inked for  margin purposes.   I make sure everything was dry.  I put 0.25% plain Marcaine in and  closed with two layers on the breast tissue and then 4-0 Monocryl  subcuticular Dermabond on the skin.   The patient tolerated the procedure well.  There were no complications.  All counts were correct.      Currie Paris, M.D.  Electronically Signed     CJS/MEDQ  D:  08/09/2008  T:  08/10/2008  Job:  [id number]   cc:  Dario Guardian, M.D.

## 2010-05-26 NOTE — Assessment & Plan Note (Signed)
[facility]                         GASTROENTEROLOGY OFFICE NOTE   NAME:[name], [name]                       MRN:          [id number]  DATE:02/03/2007                            DOB:          1945-09-30    HISTORY:  [name] presents today for followup.  She is a 65-year-  old with a history of gastroesophageal reflux disease complicated by  peptic stricture, diarrhea-predominant irritable bowel syndrome,  hypertension, seizure disorder and depression.  For her reflux disease,  she is maintained on Prevacid 30 mg b.i.d.  On Prevacid at this dosage  her symptoms are well controlled.  With a lower dose of the drug she has  significant breakthrough symptoms.  Her last upper endoscopy was  performed January of 2007.  She was found to have a distal esophageal  stricture which was dilated to 18 mm.  She has had no recurrent  dysphagia since that time.  Today she reports doing well on her reflux  medication.  She does tell me that she will have regurgitation at  nighttime with dietary indiscretion.  She has had some weight gain.   CURRENT MEDICATIONS:  1. Prevacid 30 mg b.i.d.  2. Dilantin 300 mg daily.  3. Paxil 40 mg daily.  4. Hyzaar 10/25 mg daily.  5. Doxazosin 2 mg at night.  6. Metoprolol 25 mg in the morning.  7. Levoxyl 25 mcg daily.  8. Calcitriol 0.5 mg daily.  9. Calcium.  10.Multivitamin.  11.Flonase.   PHYSICAL EXAMINATION:  A well-appearing female, no acute distress.  Blood pressure is 142/84, heart rate is 60 and regular, weight is 155.8  pounds (increased 5.8 pounds).  HEENT:  Sclerae anicteric, conjunctivae are pink, oral mucosa is intact.  LUNGS:  Clear.  HEART:  Regular.  ABDOMEN:  Soft without tenderness, masses or hernia.  Good bowel sounds  heard.   IMPRESSION:  1. Gastroesophageal reflux disease with a history of peptic stricture,      currently asymptomatic on Prevacid 30 mg b.i.d.  2. History of  diarrhea-predominant irritable bowel, stable.   RECOMMENDATIONS:  1. Continue Prevacid 30 mg b.i.d.  2. Strict adherence to reflux precautions with attention to weight      loss and elevation of the      head of the bed with bed blocks.  3. Routine office followup in 1 year unless interval questions or      problems.     Wilhemina Bonito. Marina Goodell, MD  Electronically Signed    JNP/MedQ  DD: 02/03/2007  DT: 02/04/2007  Job #: [id number]

## 2010-06-24 ENCOUNTER — Inpatient Hospital Stay (HOSPITAL_BASED_OUTPATIENT_CLINIC_OR_DEPARTMENT_OTHER)
Admission: RE | Admit: 2010-06-24 | Discharge: 2010-06-24 | Disposition: A | Discharge status: Home / Self Care | Payer: BC Managed Care – PPO | Source: Ambulatory Visit | Attending: Cardiology | Admitting: Cardiology

## 2010-06-24 DIAGNOSIS — I2510 Atherosclerotic heart disease of native coronary artery without angina pectoris: Secondary | ICD-10-CM | POA: Insufficient documentation

## 2010-06-24 DIAGNOSIS — R0602 Shortness of breath: Secondary | ICD-10-CM | POA: Insufficient documentation

## 2010-06-24 DIAGNOSIS — I252 Old myocardial infarction: Secondary | ICD-10-CM | POA: Insufficient documentation

## 2010-06-24 DIAGNOSIS — R079 Chest pain, unspecified: Secondary | ICD-10-CM | POA: Insufficient documentation

## 2010-06-24 DIAGNOSIS — Z9861 Coronary angioplasty status: Secondary | ICD-10-CM | POA: Insufficient documentation

## 2010-06-26 ENCOUNTER — Ambulatory Visit (HOSPITAL_COMMUNITY)
Admission: RE | Admit: 2010-06-26 | Discharge: 2010-06-27 | Disposition: A | Discharge status: Home / Self Care | Payer: BC Managed Care – PPO | Source: Ambulatory Visit | Attending: Interventional Cardiology | Admitting: Interventional Cardiology

## 2010-06-26 DIAGNOSIS — F329 Major depressive disorder, single episode, unspecified: Secondary | ICD-10-CM | POA: Insufficient documentation

## 2010-06-26 DIAGNOSIS — D649 Anemia, unspecified: Secondary | ICD-10-CM | POA: Insufficient documentation

## 2010-06-26 DIAGNOSIS — F3289 Other specified depressive episodes: Secondary | ICD-10-CM | POA: Insufficient documentation

## 2010-06-26 DIAGNOSIS — I2510 Atherosclerotic heart disease of native coronary artery without angina pectoris: Secondary | ICD-10-CM | POA: Insufficient documentation

## 2010-06-26 DIAGNOSIS — K649 Unspecified hemorrhoids: Secondary | ICD-10-CM | POA: Insufficient documentation

## 2010-06-26 DIAGNOSIS — K219 Gastro-esophageal reflux disease without esophagitis: Secondary | ICD-10-CM | POA: Insufficient documentation

## 2010-06-26 DIAGNOSIS — G5600 Carpal tunnel syndrome, unspecified upper limb: Secondary | ICD-10-CM | POA: Insufficient documentation

## 2010-06-26 DIAGNOSIS — E039 Hypothyroidism, unspecified: Secondary | ICD-10-CM | POA: Insufficient documentation

## 2010-06-26 DIAGNOSIS — I10 Essential (primary) hypertension: Secondary | ICD-10-CM | POA: Insufficient documentation

## 2010-06-26 DIAGNOSIS — I252 Old myocardial infarction: Secondary | ICD-10-CM | POA: Insufficient documentation

## 2010-06-26 DIAGNOSIS — G40909 Epilepsy, unspecified, not intractable, without status epilepticus: Secondary | ICD-10-CM | POA: Insufficient documentation

## 2010-06-26 DIAGNOSIS — Q762 Congenital spondylolisthesis: Secondary | ICD-10-CM | POA: Insufficient documentation

## 2010-06-26 DIAGNOSIS — I498 Other specified cardiac arrhythmias: Secondary | ICD-10-CM | POA: Insufficient documentation

## 2010-06-26 DIAGNOSIS — Z01810 Encounter for preprocedural cardiovascular examination: Secondary | ICD-10-CM | POA: Insufficient documentation

## 2010-06-26 LAB — POCT I-STAT, CHEM 8
BUN: 19 mg/dL (ref 6–23)
Chloride: 104 mEq/L (ref 96–112)
HCT: 35 % — ABNORMAL LOW (ref 36.0–46.0)
Potassium: 3.5 mEq/L (ref 3.5–5.1)
Sodium: 141 mEq/L (ref 135–145)

## 2010-06-26 LAB — POCT ACTIVATED CLOTTING TIME: Activated Clotting Time: 408 seconds

## 2010-06-26 LAB — GLUCOSE, CAPILLARY: Glucose-Capillary: 121 mg/dL — ABNORMAL HIGH (ref 70–99)

## 2010-06-27 LAB — CBC
MCH: 30.6 pg (ref 26.0–34.0)
Platelets: 192 10*3/uL (ref 150–400)
RBC: 3.59 MIL/uL — ABNORMAL LOW (ref 3.87–5.11)
WBC: 6.5 10*3/uL (ref 4.0–10.5)

## 2010-06-27 LAB — BASIC METABOLIC PANEL
CO2: 29 mEq/L (ref 19–32)
Calcium: 8.2 mg/dL — ABNORMAL LOW (ref 8.4–10.5)
Potassium: 3 mEq/L — ABNORMAL LOW (ref 3.5–5.1)
Sodium: 141 mEq/L (ref 135–145)

## 2010-07-06 ENCOUNTER — Encounter (HOSPITAL_COMMUNITY): Payer: BC Managed Care – PPO

## 2010-07-07 NOTE — Cardiovascular Report (Signed)
NAMESHARLOTTE, [name]                ACCOUNT NO.:  [id number]  MEDICAL RECORD NO.:  [id number]  LOCATION:                                 FACILITY:  PHYSICIAN:  Armanda Magic, M.D.     DATE OF BIRTH:  21-Mar-1945  DATE OF PROCEDURE:  06/24/2010 DATE OF DISCHARGE:                           CARDIAC CATHETERIZATION   REFERRING PHYSICIAN:  Dario Guardian, MD  PROCEDURE:  Left heart catheterization, coronary angiography.  OPERATOR:  Armanda Magic, MD  INDICATIONS:  New onset of chest pain with history of CAD.  This is a 65 year old female who underwent PCI of the distal RCA after myocardial infarction and underwent stenting of the distal RCA in December 2011.  She now presents back with episodic chest pain and shortness of breath.  Nuclear stress test showed a mild perfusion defect in the inferior apex.  The patient was brought to the cardiac catheterization laboratory in a fasting nonsedated state.  Informed consent was obtained.  The patient was connected to continuous heart rate and pulse oximetry monitoring and intermittent blood pressure monitoring.  The right groin was prepped and draped in sterile fashion.  Xylocaine 1% was used for local anesthesia. Using modified Seldinger technique, a 4-French sheath was placed in right femoral artery.  Under fluoroscopic guidance, a 4-French JL-4 catheter was placed in left coronary artery.  Multiple cine films were taken at 30-degree RAO and 40-degree LAO views.  This catheter was exchanged out over a guidewire for 4-French 3 DRCA catheter which successfully engaged the right coronary ostium.  Multiple cine films were taken at 30-degree RAO and 40-degree LAO views.  This catheter was then exchanged out over a guidewire for a 4-French angled pigtail catheter which was placed under fluoroscopic guidance in left ventricular cavity.  Left ventriculography was not performed because of the patient's borderline low normal GFR and also the fact  that the patient will have to undergo PCI in the near future.  LV pressure was obtained and the catheter was pulled back across the aortic valve with no significant gradient noted.  At the end of the procedure, all catheters and sheaths were removed.  Manual compression was applied and hemostasis was obtained.  The patient was transferred back to room in stable condition.  RESULTS: 1. The left main coronary artery is widely patent and bifurcates in     left anterior descending artery and left circumflex artery.  1. The left anterior descending artery is calcified in its proximal     and midportion.  It gives rise to a first diagonal branch which is     moderate size and widely patent.  Just distal to the takeoff of the     first diagonal is a 40% narrowing in the mid LAD, it then gives off     a small second diagonal branch.  Just distal to the takeoff of the     second diagonal, the LAD tapers down.  There appears to be no     obstructive lesion but the caliber of the lumen tapers the rest of     the way to the apex.  1. The left circumflex is widely patent  throughout its course in the     AV groove.  It gives rise to a very large obtuse marginal branch     which is widely patent and a second obtuse marginal branch which is     small but patent.  1. The right coronary artery is calcified throughout the vessel.     There is evidence of a stent in the distal RCA which is widely     patent.  The RCA is patent without any obstructive lesions in the     proximal, mid and distal portion but at the distal to the stent in     the distal RCA at the bifurcation of PDA and posterolateral branch,     there is a 90% ostial posterolateral stenosis.  The PDA is widely     patent.  ASSESSMENT: 1. One-vessel obstructive coronary artery disease in posterolateral     branch just distal to the distal right coronary artery stent. 2. Nonobstructive coronary artery disease of the left anterior      descending.  PLAN:  Discharge home after IV fluid and bedrest.  Plan PCI of the posterolateral branch as an outpatient per Dr. Katrinka Blazing.     Armanda Magic, M.D.     TT/MEDQ  D:  06/24/2010  T:  06/24/2010  Job:  [id number]  cc:   Dario Guardian, M.D.  Electronically Signed by Armanda Magic M.D. on 07/07/2010 09:59:30 PM

## 2010-07-07 NOTE — Discharge Summary (Signed)
[name], [name]                ACCOUNT NO.:  [id number]  MEDICAL RECORD NO.:  [id number]  LOCATION:  6526                         FACILITY:  [facility]  PHYSICIAN:  Armanda Magic, M.D.     DATE OF BIRTH:  01-Mar-1945  DATE OF ADMISSION:  06/26/2010 DATE OF DISCHARGE:  06/27/2010                              DISCHARGE SUMMARY   ADMISSION DIAGNOSIS: 1. Coronary artery disease status post non-ST elevation myocardial     infarction in November 2011 with PCI stenting of the distal right     coronary artery. 2. Chest pain with abnormal nuclear stress test status post cardiac     catheterization June 24, 2010 with new 90% posterior lateral     stenosis of the right coronary artery with patent distal right     coronary artery stent. 3. Hypertension. 4. Carpal tunnel syndrome. 5. Seizure disorder. 6. Depression. 7. Gastroesophageal reflux disease. 8. Hypothyroidism. 9. Spondylolisthesis. 10.Supraventricular tachycardia. 11.Hemorrhoids. 12.Normochromic normocytic anemia.  DISCHARGE DIAGNOSES: 1. Coronary artery disease with new 90% posterolateral stenosis off     the RCA status post PCI of the posterolateral branch by Dr. Verdis Prime with drug-eluting stent. 2. Remote non-ST elevation myocardial infarction in November 2011 with     stent placed in the right coronary artery at that time which was     patent at the time of cath on June 13. 3. Hypertension. 4. Carpal tunnel 5. Depression. 6. Gastroesophageal reflux disease. 7. Hypothyroidism. 8. Spondylolisthesis. 9. Supraventricular tachycardia. 10.Seizure disorder. 11.Hemorrhoids. 12.Normochromic normocytic anemia.  PROCEDURES:  A drug-eluting stent placement to the posterior lateral branch of the RCA.  HISTORY OF PRESENT ILLNESS:  This is a 65 year old female with a history of non-ST elevation MI in November 2011 at which time she was found to have high-grade lesion of the distal RCA and underwent stenting.   She presented to the cath lab as an outpatient on June 13 and was found to have a new 90% posterolateral stenosis with a patent distal right coronary artery stent.  She was readmitted on June 26, 2010 and underwent a PTCA and drug-eluting stent placement to the posterolateral branch of the RCA.  This was uncomplicated and she uncomplicated postop course.  She was ambulating on the day of discharge without any complications.  Her groin was stable without any evidence of hematoma with good distal pulses bilaterally.  Of note, her potassium was low at 3.0 on the day of discharge and she was given supplemental potassium, K- Dur 40 mEq in addition to her 20 mEq that morning she would receive. She was discharged home in stable condition.  DISCHARGE MEDICATIONS: 1. Aspirin 325 mg daily. 2. Calcium over-the-counter 1 tablet daily. 3. Cardizem CD 240 mg daily. 4. Crestor 10 mg daily. 5. Keppra 500 mg b.i.d. 6. Klor-Con 20 mEq 2 tablets every morning. 7. Levoxyl 100 mcg daily. 8. Losartan/HCTZ 100/25 mg daily. 9. Protonix 40 mg b.i.d. 10.Paroxetine 30 mg daily. 11.Plavix 75 mg daily. 12.Toprol-XL 25 mg daily. 13.Tylenol at bedtime as needed. 14.Tylenol Extra Strength 500 mg 2 tablets as needed for severe pain. 15.Vicodin 5/500 mg 1 tablet every 6 hours as needed for  pain. 16.Vitamin E 1 tablet every morning.  FOLLOWUP:  She will call for an appointment to follow up with me in 2 weeks in my office.  She has been instructed in a low-sodium heart healthy diet as well as also instructed to avoid lifting anything more than 10 pounds for the next week.  She has been reenrolled in cardiac rehab and they will be calling her to set up her time to come to cardiac rehab.     Armanda Magic, M.D.     TT/MEDQ  D:  06/27/2010  T:  06/27/2010  Job:  [id number]  cc:   Dario Guardian, M.D.  Electronically Signed by Armanda Magic M.D. on 07/07/2010 09:59:33 PM

## 2010-07-08 ENCOUNTER — Encounter (HOSPITAL_COMMUNITY): Payer: BC Managed Care – PPO

## 2010-07-10 ENCOUNTER — Other Ambulatory Visit: Payer: Self-pay | Admitting: Oncology

## 2010-07-10 ENCOUNTER — Encounter (HOSPITAL_BASED_OUTPATIENT_CLINIC_OR_DEPARTMENT_OTHER): Payer: BC Managed Care – PPO | Admitting: Oncology

## 2010-07-10 ENCOUNTER — Encounter (HOSPITAL_COMMUNITY): Payer: BC Managed Care – PPO | Attending: Cardiology

## 2010-07-10 DIAGNOSIS — D539 Nutritional anemia, unspecified: Secondary | ICD-10-CM

## 2010-07-10 DIAGNOSIS — E039 Hypothyroidism, unspecified: Secondary | ICD-10-CM | POA: Insufficient documentation

## 2010-07-10 DIAGNOSIS — Z5189 Encounter for other specified aftercare: Secondary | ICD-10-CM | POA: Insufficient documentation

## 2010-07-10 DIAGNOSIS — D649 Anemia, unspecified: Secondary | ICD-10-CM

## 2010-07-10 DIAGNOSIS — F3289 Other specified depressive episodes: Secondary | ICD-10-CM | POA: Insufficient documentation

## 2010-07-10 DIAGNOSIS — I10 Essential (primary) hypertension: Secondary | ICD-10-CM | POA: Insufficient documentation

## 2010-07-10 DIAGNOSIS — G40909 Epilepsy, unspecified, not intractable, without status epilepticus: Secondary | ICD-10-CM | POA: Insufficient documentation

## 2010-07-10 DIAGNOSIS — I2510 Atherosclerotic heart disease of native coronary artery without angina pectoris: Secondary | ICD-10-CM | POA: Insufficient documentation

## 2010-07-10 DIAGNOSIS — Z9861 Coronary angioplasty status: Secondary | ICD-10-CM | POA: Insufficient documentation

## 2010-07-10 DIAGNOSIS — I2119 ST elevation (STEMI) myocardial infarction involving other coronary artery of inferior wall: Secondary | ICD-10-CM | POA: Insufficient documentation

## 2010-07-10 DIAGNOSIS — E785 Hyperlipidemia, unspecified: Secondary | ICD-10-CM | POA: Insufficient documentation

## 2010-07-10 DIAGNOSIS — F329 Major depressive disorder, single episode, unspecified: Secondary | ICD-10-CM | POA: Insufficient documentation

## 2010-07-10 DIAGNOSIS — K219 Gastro-esophageal reflux disease without esophagitis: Secondary | ICD-10-CM | POA: Insufficient documentation

## 2010-07-10 LAB — FERRITIN: Ferritin: 151 ng/mL (ref 10–291)

## 2010-07-10 LAB — IRON AND TIBC
%SAT: 23 % (ref 20–55)
Iron: 54 ug/dL (ref 42–145)
UIBC: 179 ug/dL

## 2010-07-10 LAB — CBC WITH DIFFERENTIAL/PLATELET
Eosinophils Absolute: 0.2 10*3/uL (ref 0.0–0.5)
HGB: 11.7 g/dL (ref 11.6–15.9)
LYMPH%: 31.6 % (ref 14.0–49.7)
MONO#: 0.7 10*3/uL (ref 0.1–0.9)
NEUT#: 3.5 10*3/uL (ref 1.5–6.5)
Platelets: 262 10*3/uL (ref 145–400)
RBC: 3.76 10*6/uL (ref 3.70–5.45)
RDW: 12.7 % (ref 11.2–14.5)
WBC: 6.4 10*3/uL (ref 3.9–10.3)

## 2010-07-13 ENCOUNTER — Encounter (HOSPITAL_COMMUNITY): Payer: BC Managed Care – PPO | Attending: Cardiology

## 2010-07-13 DIAGNOSIS — Z5189 Encounter for other specified aftercare: Secondary | ICD-10-CM | POA: Insufficient documentation

## 2010-07-13 DIAGNOSIS — G40909 Epilepsy, unspecified, not intractable, without status epilepticus: Secondary | ICD-10-CM | POA: Insufficient documentation

## 2010-07-13 DIAGNOSIS — I2510 Atherosclerotic heart disease of native coronary artery without angina pectoris: Secondary | ICD-10-CM | POA: Insufficient documentation

## 2010-07-13 DIAGNOSIS — E039 Hypothyroidism, unspecified: Secondary | ICD-10-CM | POA: Insufficient documentation

## 2010-07-13 DIAGNOSIS — F329 Major depressive disorder, single episode, unspecified: Secondary | ICD-10-CM | POA: Insufficient documentation

## 2010-07-13 DIAGNOSIS — F3289 Other specified depressive episodes: Secondary | ICD-10-CM | POA: Insufficient documentation

## 2010-07-13 DIAGNOSIS — Z9861 Coronary angioplasty status: Secondary | ICD-10-CM | POA: Insufficient documentation

## 2010-07-13 DIAGNOSIS — I10 Essential (primary) hypertension: Secondary | ICD-10-CM | POA: Insufficient documentation

## 2010-07-13 DIAGNOSIS — E785 Hyperlipidemia, unspecified: Secondary | ICD-10-CM | POA: Insufficient documentation

## 2010-07-13 DIAGNOSIS — I2119 ST elevation (STEMI) myocardial infarction involving other coronary artery of inferior wall: Secondary | ICD-10-CM | POA: Insufficient documentation

## 2010-07-13 DIAGNOSIS — K219 Gastro-esophageal reflux disease without esophagitis: Secondary | ICD-10-CM | POA: Insufficient documentation

## 2010-07-15 ENCOUNTER — Encounter (HOSPITAL_COMMUNITY): Payer: BC Managed Care – PPO

## 2010-07-16 NOTE — Cardiovascular Report (Signed)
  NAMEANNICK, DIMAIO NO.:  [id number]  MEDICAL RECORD NO.:  [id number]  LOCATION:  6526                         FACILITY:  [facility]  PHYSICIAN:  Lyn Records, M.D.   DATE OF BIRTH:  [birth year]  DATE OF PROCEDURE:  06/26/2010 DATE OF DISCHARGE:                           CARDIAC CATHETERIZATION   INDICATIONS:  A 65 year old female with history of depression, seizure disorder and coronary artery disease who had an inferior infarction treated with stenting in November 2011, has been having recurrent chest pain.  An abnormal nuclear study followed by cath demonstrated a de nova lesion further distal in the right coronary beyond the previously placed stent.  She is brought in today for elective stenting of the distal right coronary artery beyond the origin of the posterior descending.  PROCEDURES PERFORMED: 1. Cutting balloon angioplasty on the distal right coronary artery. 2. Drug-eluting stent implantation. 3. Angio-Seal.  DESCRIPTION:  A 6-French sheath was started.  Informed consent had been obtained after the diagnostic catheterization and repeat discussion was performed prior to starting this morning.  A 6-French shepherd's crook right coronary guide system was used.  Angio-Seal bolus followed by infusion with resultant ACT greater than 400 was obtained, 300 mg of Plavix was given in short stay.  The PCI was performed using 2 wires.  A Prowater and BMW wire were passed easily through the distal stenosis.  We then used a 6. 0 mm x 2.5 mm cutting balloon to predilate and then positioned and deployed a 2.75 x 8-mm long drug-eluting Promus stent (Element).  We then postdilated with a 2.75 x 6 mm [place] Trek to 14 atmospheres.  The patient experienced chest discomfort with balloon inflations greater than 20 seconds.  Angio-Seal was deployed without complications.  CONCLUSION:  Successful stenting of the distal RCA 95% stenosis reducing it to 0% with TIMI grade 3  flow.  PLAN:  Continue aspirin and Plavix.  Plavix should be continued for at least an additional 12 months.  The patient will be eligible for discharge in the a.m.     Lyn Records, M.D.     HWS/MEDQ  D:  06/26/2010  T:  06/27/2010  Job:  [id number]  cc:   Dario Guardian, M.D. Armanda Magic, M.D.  Electronically Signed by Verdis Prime M.D. on 07/16/2010 01:38:23 PM

## 2010-07-17 ENCOUNTER — Encounter (HOSPITAL_COMMUNITY): Payer: BC Managed Care – PPO

## 2010-07-20 ENCOUNTER — Encounter (HOSPITAL_COMMUNITY): Payer: BC Managed Care – PPO

## 2010-07-22 ENCOUNTER — Encounter (HOSPITAL_COMMUNITY): Payer: BC Managed Care – PPO

## 2010-07-24 ENCOUNTER — Encounter (HOSPITAL_COMMUNITY): Payer: BC Managed Care – PPO

## 2010-07-27 ENCOUNTER — Encounter (HOSPITAL_COMMUNITY): Payer: BC Managed Care – PPO

## 2010-07-29 ENCOUNTER — Encounter (HOSPITAL_COMMUNITY): Payer: BC Managed Care – PPO

## 2010-07-31 ENCOUNTER — Encounter (HOSPITAL_COMMUNITY): Payer: BC Managed Care – PPO

## 2010-08-03 ENCOUNTER — Encounter (HOSPITAL_COMMUNITY): Payer: BC Managed Care – PPO

## 2010-08-05 ENCOUNTER — Encounter (HOSPITAL_COMMUNITY): Payer: BC Managed Care – PPO

## 2010-08-07 ENCOUNTER — Encounter (HOSPITAL_COMMUNITY): Payer: BC Managed Care – PPO

## 2010-08-10 ENCOUNTER — Encounter (HOSPITAL_COMMUNITY): Payer: BC Managed Care – PPO

## 2010-08-12 ENCOUNTER — Encounter (HOSPITAL_COMMUNITY): Payer: BC Managed Care – PPO | Attending: Cardiology

## 2010-08-12 DIAGNOSIS — I2510 Atherosclerotic heart disease of native coronary artery without angina pectoris: Secondary | ICD-10-CM | POA: Insufficient documentation

## 2010-08-12 DIAGNOSIS — E039 Hypothyroidism, unspecified: Secondary | ICD-10-CM | POA: Insufficient documentation

## 2010-08-12 DIAGNOSIS — Z5189 Encounter for other specified aftercare: Secondary | ICD-10-CM | POA: Insufficient documentation

## 2010-08-12 DIAGNOSIS — G40909 Epilepsy, unspecified, not intractable, without status epilepticus: Secondary | ICD-10-CM | POA: Insufficient documentation

## 2010-08-12 DIAGNOSIS — E785 Hyperlipidemia, unspecified: Secondary | ICD-10-CM | POA: Insufficient documentation

## 2010-08-12 DIAGNOSIS — I10 Essential (primary) hypertension: Secondary | ICD-10-CM | POA: Insufficient documentation

## 2010-08-12 DIAGNOSIS — F3289 Other specified depressive episodes: Secondary | ICD-10-CM | POA: Insufficient documentation

## 2010-08-12 DIAGNOSIS — F329 Major depressive disorder, single episode, unspecified: Secondary | ICD-10-CM | POA: Insufficient documentation

## 2010-08-12 DIAGNOSIS — I2119 ST elevation (STEMI) myocardial infarction involving other coronary artery of inferior wall: Secondary | ICD-10-CM | POA: Insufficient documentation

## 2010-08-12 DIAGNOSIS — K219 Gastro-esophageal reflux disease without esophagitis: Secondary | ICD-10-CM | POA: Insufficient documentation

## 2010-08-12 DIAGNOSIS — Z9861 Coronary angioplasty status: Secondary | ICD-10-CM | POA: Insufficient documentation

## 2010-08-14 ENCOUNTER — Encounter (HOSPITAL_COMMUNITY): Payer: BC Managed Care – PPO

## 2010-08-17 ENCOUNTER — Encounter (HOSPITAL_COMMUNITY): Payer: BC Managed Care – PPO

## 2010-08-19 ENCOUNTER — Encounter (HOSPITAL_COMMUNITY): Payer: BC Managed Care – PPO

## 2010-08-21 ENCOUNTER — Encounter (HOSPITAL_COMMUNITY): Payer: BC Managed Care – PPO

## 2010-08-24 ENCOUNTER — Encounter (HOSPITAL_COMMUNITY): Payer: BC Managed Care – PPO

## 2010-08-26 ENCOUNTER — Encounter (HOSPITAL_COMMUNITY): Payer: BC Managed Care – PPO

## 2010-08-28 ENCOUNTER — Encounter (HOSPITAL_COMMUNITY): Payer: BC Managed Care – PPO

## 2010-08-31 ENCOUNTER — Encounter (HOSPITAL_COMMUNITY): Payer: BC Managed Care – PPO

## 2010-09-02 ENCOUNTER — Encounter (HOSPITAL_COMMUNITY): Payer: BC Managed Care – PPO

## 2010-09-04 ENCOUNTER — Encounter (HOSPITAL_COMMUNITY): Payer: BC Managed Care – PPO

## 2010-09-07 ENCOUNTER — Encounter (HOSPITAL_COMMUNITY): Payer: BC Managed Care – PPO

## 2010-09-09 ENCOUNTER — Encounter (HOSPITAL_COMMUNITY): Payer: BC Managed Care – PPO

## 2010-09-11 ENCOUNTER — Encounter (HOSPITAL_COMMUNITY): Payer: BC Managed Care – PPO

## 2010-09-14 ENCOUNTER — Encounter (HOSPITAL_COMMUNITY): Payer: BC Managed Care – PPO

## 2010-09-16 ENCOUNTER — Encounter (HOSPITAL_COMMUNITY): Payer: BC Managed Care – PPO | Attending: Cardiology

## 2010-09-16 DIAGNOSIS — Z9861 Coronary angioplasty status: Secondary | ICD-10-CM | POA: Insufficient documentation

## 2010-09-16 DIAGNOSIS — K219 Gastro-esophageal reflux disease without esophagitis: Secondary | ICD-10-CM | POA: Insufficient documentation

## 2010-09-16 DIAGNOSIS — E039 Hypothyroidism, unspecified: Secondary | ICD-10-CM | POA: Insufficient documentation

## 2010-09-16 DIAGNOSIS — I2119 ST elevation (STEMI) myocardial infarction involving other coronary artery of inferior wall: Secondary | ICD-10-CM | POA: Insufficient documentation

## 2010-09-16 DIAGNOSIS — I10 Essential (primary) hypertension: Secondary | ICD-10-CM | POA: Insufficient documentation

## 2010-09-16 DIAGNOSIS — Z5189 Encounter for other specified aftercare: Secondary | ICD-10-CM | POA: Insufficient documentation

## 2010-09-16 DIAGNOSIS — F329 Major depressive disorder, single episode, unspecified: Secondary | ICD-10-CM | POA: Insufficient documentation

## 2010-09-16 DIAGNOSIS — I2510 Atherosclerotic heart disease of native coronary artery without angina pectoris: Secondary | ICD-10-CM | POA: Insufficient documentation

## 2010-09-16 DIAGNOSIS — G40909 Epilepsy, unspecified, not intractable, without status epilepticus: Secondary | ICD-10-CM | POA: Insufficient documentation

## 2010-09-16 DIAGNOSIS — E785 Hyperlipidemia, unspecified: Secondary | ICD-10-CM | POA: Insufficient documentation

## 2010-09-16 DIAGNOSIS — F3289 Other specified depressive episodes: Secondary | ICD-10-CM | POA: Insufficient documentation

## 2010-09-18 ENCOUNTER — Encounter (HOSPITAL_COMMUNITY): Payer: BC Managed Care – PPO

## 2010-09-21 ENCOUNTER — Encounter (HOSPITAL_COMMUNITY): Payer: BC Managed Care – PPO

## 2010-09-23 ENCOUNTER — Encounter (HOSPITAL_COMMUNITY): Payer: BC Managed Care – PPO

## 2010-09-25 ENCOUNTER — Encounter (HOSPITAL_COMMUNITY): Payer: BC Managed Care – PPO

## 2010-09-28 ENCOUNTER — Encounter (HOSPITAL_COMMUNITY): Payer: BC Managed Care – PPO

## 2010-09-30 ENCOUNTER — Encounter (HOSPITAL_COMMUNITY): Payer: BC Managed Care – PPO

## 2010-10-02 ENCOUNTER — Encounter (HOSPITAL_COMMUNITY): Payer: BC Managed Care – PPO

## 2010-10-05 ENCOUNTER — Encounter (HOSPITAL_COMMUNITY): Payer: BC Managed Care – PPO

## 2010-10-07 ENCOUNTER — Encounter (HOSPITAL_COMMUNITY): Payer: BC Managed Care – PPO

## 2010-10-08 ENCOUNTER — Encounter: Payer: Self-pay | Admitting: Internal Medicine

## 2010-10-08 NOTE — Telephone Encounter (Signed)
Error

## 2010-10-09 ENCOUNTER — Encounter (HOSPITAL_COMMUNITY): Payer: BC Managed Care – PPO

## 2010-11-02 ENCOUNTER — Ambulatory Visit: Payer: BC Managed Care – PPO | Admitting: Internal Medicine

## 2010-12-02 ENCOUNTER — Encounter: Payer: Self-pay | Admitting: Internal Medicine

## 2010-12-02 ENCOUNTER — Ambulatory Visit (INDEPENDENT_AMBULATORY_CARE_PROVIDER_SITE_OTHER): Payer: BC Managed Care – PPO | Admitting: Internal Medicine

## 2010-12-02 ENCOUNTER — Telehealth: Payer: Self-pay

## 2010-12-02 DIAGNOSIS — I2510 Atherosclerotic heart disease of native coronary artery without angina pectoris: Secondary | ICD-10-CM

## 2010-12-02 DIAGNOSIS — K222 Esophageal obstruction: Secondary | ICD-10-CM

## 2010-12-02 DIAGNOSIS — K219 Gastro-esophageal reflux disease without esophagitis: Secondary | ICD-10-CM

## 2010-12-02 DIAGNOSIS — R1310 Dysphagia, unspecified: Secondary | ICD-10-CM

## 2010-12-02 NOTE — Patient Instructions (Signed)
We will contact you after Dr. Marina Goodell has spoke with Dr. Mayford Knife

## 2010-12-02 NOTE — Telephone Encounter (Signed)
Left message with dr. Norris Cross nurse asking Dr. Mayford Knife to call Dr. Marina Goodell as soon as returns to the office re; Pt's plavix

## 2010-12-02 NOTE — Progress Notes (Signed)
HISTORY OF PRESENT ILLNESS:  [name] is a 65 y.o. female with multiple significant medical problems including hypertension, hypothyroidism, dyslipidemia, coronary artery disease with prior myocardial infarction and coronary artery stent placement for which she is on chronic aspirin and Plavix, and GERD complicated by peptic stricture. She presents today with worsening intermittent solid food dysphagia. The patient was last seen in December of 2011 when she underwent colonoscopy and upper endoscopy to evaluate anemia. Both examinations were unremarkable. She presents now with a several month history of intermittent solid food dysphagia. Worsening. She continues on pantoprazole 40 mg twice a day to control reflux symptoms. Despite this, occasional breakthrough. She has undergone coronary artery stenting on several occasions, most recently in May of 2012. She sees Dr. Armanda Magic of [facility]. Her GI review of systems is otherwise negative. Her last upper endoscopy with esophageal dilation, for an obvious peptic stricture, was in August of 2009. She was dilated with a 54 [place] Maloney dilator at that time.  REVIEW OF SYSTEMS:  All non-GI ROS negative except for back pain  Past Medical History  Diagnosis Date  . Irritable bowel syndrome   . Esophageal reflux   . Dysphagia, unspecified   . Hypertension   . Depression   . Hypothyroidism   . Myocardial infarction 11/2009  . Seizures   . Iron deficiency anemia     Past Surgical History  Procedure Date  . Breast surgery 2010    right  . Coronary angioplasty with stent placement     drug-eluting stent w/ ion stents to the distal right coronary artery    Social History Jasmine AUBRIEGH [name]  reports that she has quit smoking. She has never used smokeless tobacco. She reports that she does not drink alcohol or use illicit drugs.  family history includes Breast cancer in her sister and unspecified family member; Heart attack in her  mother; and Heart disease in her father.  There is no history of Colon cancer.  No Known Allergies     PHYSICAL EXAMINATION: Vital signs: BP 152/80  Pulse 68  Ht 5' (1.524 m)  Wt 148 lb 6.4 oz (67.314 kg)  BMI 28.98 kg/m2 General: Well-developed, well-nourished, no acute distress HEENT: Sclerae are anicteric, conjunctiva pink. Oral mucosa intact Lungs: Clear Heart: Regular Abdomen: soft, obese, nontender, nondistended, no obvious ascites, no peritoneal signs, normal bowel sounds. No organomegaly. Extremities: No edema Psychiatric: alert and oriented x3. Cooperative    ASSESSMENT:  #1. Intermittent solid food dysphagia likely secondary to recurrent peptic stricture #2. GERD #3. Multiple significant medical problems including coronary artery disease with recent coronary artery stent placement and the need for chronic aspirin/Plavix therapy.   PLAN:  #1. Ideally, from a GI standpoint, it would be optimal to perform upper endoscopy with esophageal dilation while off Plavix therapy. However, the risk may not be acceptable, from a cardiac standpoint, at this point. Her most recent stent was placed about about 6 months ago.  I told Mrs. Woodmansee, that I feel uncomfortable performing esophageal dilation on Plavix, because of the increased risk of bleeding, unless absolutely necessary. It may be, that the patient needs to wait several months, at which point an eruption of Plavix may carry lower risk. From a GI standpoint, she would need to be off for about 5 days. We would continue aspirin. If she absolutely cannot come off Plavix, then consider balloon dilation under direct vision.I have contacted Dr. Norris Cross office to discuss Mrs. Asselin's case. I am told  that Dr. Norris Cross way this week, for the holiday. I have asked that she contact me next week. I have forwarded my note. Thereafter, we will contact the patient with a recommendations.

## 2010-12-08 ENCOUNTER — Telehealth: Payer: Self-pay

## 2010-12-08 NOTE — Telephone Encounter (Signed)
Message copied by Michele Mcalpine on Tue Dec 08, 2010  8:19 AM ------      Message from: Hilarie Fredrickson      Created: Tue Dec 08, 2010 12:21 AM       Call patient and let her know that I spoke with her cardiologist. She, Dr Mayford Knife, does not want the patient off plavix yet. Possibly in 3-4 months. As such, no plans for EGD / dilation at this time... chew food well and keep cardiology appt in March. If swallowing difficulty becomes terribly severe in the interim - call.

## 2010-12-08 NOTE — Telephone Encounter (Signed)
Message copied by Michele Mcalpine on Tue Dec 08, 2010  9:12 AM ------      Message from: Hilarie Fredrickson      Created: Tue Dec 08, 2010 12:21 AM       Call patient and let her know that I spoke with her cardiologist. She, Dr Mayford Knife, does not want the patient off plavix yet. Possibly in 3-4 months. As such, no plans for EGD / dilation at this time... chew food well and keep cardiology appt in March. If swallowing difficulty becomes terribly severe in the interim - call.

## 2010-12-08 NOTE — Telephone Encounter (Signed)
Spoke with pt and she is aware.

## 2011-01-02 ENCOUNTER — Inpatient Hospital Stay (HOSPITAL_COMMUNITY)
Admission: EM | Admit: 2011-01-02 | Discharge: 2011-01-03 | DRG: 138 | Disposition: A | Discharge status: Home / Self Care | Payer: BC Managed Care – PPO | Attending: Cardiology | Admitting: Cardiology

## 2011-01-02 ENCOUNTER — Encounter (HOSPITAL_COMMUNITY): Payer: Self-pay | Admitting: Emergency Medicine

## 2011-01-02 ENCOUNTER — Other Ambulatory Visit: Payer: Self-pay

## 2011-01-02 ENCOUNTER — Emergency Department (HOSPITAL_COMMUNITY): Payer: BC Managed Care – PPO

## 2011-01-02 DIAGNOSIS — I2510 Atherosclerotic heart disease of native coronary artery without angina pectoris: Secondary | ICD-10-CM | POA: Insufficient documentation

## 2011-01-02 DIAGNOSIS — R079 Chest pain, unspecified: Secondary | ICD-10-CM

## 2011-01-02 DIAGNOSIS — R002 Palpitations: Secondary | ICD-10-CM

## 2011-01-02 DIAGNOSIS — I498 Other specified cardiac arrhythmias: Principal | ICD-10-CM | POA: Diagnosis present

## 2011-01-02 DIAGNOSIS — F3289 Other specified depressive episodes: Secondary | ICD-10-CM | POA: Diagnosis present

## 2011-01-02 DIAGNOSIS — K219 Gastro-esophageal reflux disease without esophagitis: Secondary | ICD-10-CM | POA: Diagnosis present

## 2011-01-02 DIAGNOSIS — I252 Old myocardial infarction: Secondary | ICD-10-CM

## 2011-01-02 DIAGNOSIS — E039 Hypothyroidism, unspecified: Secondary | ICD-10-CM | POA: Diagnosis present

## 2011-01-02 DIAGNOSIS — K589 Irritable bowel syndrome without diarrhea: Secondary | ICD-10-CM | POA: Diagnosis present

## 2011-01-02 DIAGNOSIS — Z7982 Long term (current) use of aspirin: Secondary | ICD-10-CM

## 2011-01-02 DIAGNOSIS — I471 Supraventricular tachycardia, unspecified: Secondary | ICD-10-CM

## 2011-01-02 DIAGNOSIS — E860 Dehydration: Secondary | ICD-10-CM | POA: Diagnosis present

## 2011-01-02 DIAGNOSIS — R1310 Dysphagia, unspecified: Secondary | ICD-10-CM | POA: Diagnosis present

## 2011-01-02 DIAGNOSIS — E876 Hypokalemia: Secondary | ICD-10-CM | POA: Diagnosis present

## 2011-01-02 DIAGNOSIS — I10 Essential (primary) hypertension: Secondary | ICD-10-CM

## 2011-01-02 DIAGNOSIS — F329 Major depressive disorder, single episode, unspecified: Secondary | ICD-10-CM | POA: Diagnosis present

## 2011-01-02 LAB — CBC
HCT: 36.4 % (ref 36.0–46.0)
MCH: 30.3 pg (ref 26.0–34.0)
MCHC: 34.1 g/dL (ref 30.0–36.0)
MCV: 89 fL (ref 78.0–100.0)
RDW: 12.9 % (ref 11.5–15.5)

## 2011-01-02 LAB — DIFFERENTIAL
Lymphs Abs: 2.9 10*3/uL (ref 0.7–4.0)
Monocytes Relative: 11 % (ref 3–12)
Neutro Abs: 3.8 10*3/uL (ref 1.7–7.7)
Neutrophils Relative %: 49 % (ref 43–77)

## 2011-01-02 LAB — POCT I-STAT TROPONIN I: Troponin i, poc: 0.02 ng/mL (ref 0.00–0.08)

## 2011-01-02 NOTE — ED Notes (Signed)
Patient stated she started having chest pain with nausea and sob around 7 pm tonight, she did not want to come first, she took 2 ntg sl and did not helped, she also took and baby ASA, her husband called EMS and paramedic found out she was in SVT in 180's, did not improve with vagal maneuver so they gave her 12mg  of Adenosine and converted to ST lower 100's. Patient stated that pressure in the chest is still there but no nausea and no sob

## 2011-01-02 NOTE — ED Provider Notes (Signed)
History     CSN: [id number]  Arrival date & time 01/02/11  2225   First MD Initiated Contact with Patient 01/02/11 2315      Chief Complaint  Patient presents with  . Chest Pain    SVT converted with Adenosine      Patient is a 65 y.o. female presenting with chest pain. The history is provided by the patient and the spouse.  Chest Pain The chest pain began 5 - 7 days ago. Chest pain occurs constantly. The chest pain is resolved. Associated with: nothing. The pain is currently at 0/10. The severity of the pain is moderate. The quality of the pain is described as pressure-like. The pain does not radiate. Exacerbated by: nothing. Primary symptoms include fatigue, shortness of breath, palpitations, nausea and dizziness.  The palpitations also occurred with dizziness and shortness of breath.   Dizziness also occurs with nausea. She tried nitroglycerin for the symptoms.   Pt with h/o CAD, today started having nausea/weakness and chest pain She took ASA at home and also took NTG without relief EMS was called, and they noted patient to be in SVT, given adenosine and patient converted to sinus tach Pt now feels improved  Past Medical History  Diagnosis Date  . Irritable bowel syndrome   . Esophageal reflux   . Dysphagia, unspecified   . Hypertension   . Depression   . Hypothyroidism   . Myocardial infarction 11/2009  . Seizures   . Iron deficiency anemia     Past Surgical History  Procedure Date  . Breast surgery 2010    right  . Coronary angioplasty with stent placement     drug-eluting stent w/ ion stents to the distal right coronary artery    Family History  Problem Relation Age of Onset  . Breast cancer Sister   . Breast cancer      niece  . Heart disease Father   . Colon cancer Neg Hx   . Heart attack Mother     History  Substance Use Topics  . Smoking status: Former Games developer  . Smokeless tobacco: Never Used  . Alcohol Use: No    OB History    Grav Para  Term Preterm Abortions TAB SAB Ect Mult Living                  Review of Systems  Constitutional: Positive for fatigue.  Respiratory: Positive for shortness of breath.   Cardiovascular: Positive for chest pain and palpitations.  Gastrointestinal: Positive for nausea.  Neurological: Positive for dizziness.  All other systems reviewed and are negative.    Allergies  Review of patient's allergies indicates no known allergies.  Home Medications   Current Outpatient Rx  Name Route Sig Dispense Refill  . ASPIRIN 325 MG PO TABS Oral Take 325 mg by mouth daily.      Marland Kitchen CALCITRIOL 0.5 MCG PO CAPS Oral Take 0.5 mcg by mouth daily.      Marland Kitchen CALCIUM-VITAMIN D 600-125 MG-UNIT PO TABS Oral Take 1 tablet by mouth daily.      Marland Kitchen CLOPIDOGREL BISULFATE 75 MG PO TABS Oral Take 75 mg by mouth daily.      Marland Kitchen DILTIAZEM HCL ER COATED BEADS 240 MG PO TB24 Oral Take 240 mg by mouth daily.      Marland Kitchen DOXAZOSIN MESYLATE 4 MG PO TABS Oral Take 4 mg by mouth at bedtime.      Marland Kitchen HYDROCODONE-ACETAMINOPHEN 5-500 MG PO TABS Oral Take 1  tablet by mouth every 6 (six) hours as needed.      Marland Kitchen LEVETIRACETAM 500 MG PO TABS Oral Take 500 mg by mouth every 12 (twelve) hours.      Marland Kitchen LEVOTHYROXINE SODIUM 100 MCG PO TABS Oral Take 100 mcg by mouth daily.      Marland Kitchen METOPROLOL SUCCINATE ER 25 MG PO TB24  25 mg. Take 1/2 of a tablet by mouth once daily      . NITROGLYCERIN 0.4 MG SL SUBL Sublingual Place 0.4 mg under the tongue every 5 (five) minutes as needed.      Marland Kitchen PANTOPRAZOLE SODIUM 40 MG PO TBEC Oral Take 40 mg by mouth 2 (two) times daily.      Marland Kitchen POTASSIUM CHLORIDE 20 MEQ PO PACK  40 mEq daily. Take two by mouth once daily    . ROSUVASTATIN CALCIUM 10 MG PO TABS Oral Take 10 mg by mouth daily.      Marland Kitchen VITAMIN E 400 UNITS PO CAPS Oral Take 400 Units by mouth daily.        BP 152/87  Pulse 102  Temp(Src) 97.6 F (36.4 C) (Oral)  Resp 18  Ht 4\' 11"  (1.499 m)  Wt 148 lb (67.132 kg)  BMI 29.89 kg/m2  SpO2 100%  Physical  Exam  CONSTITUTIONAL: Well developed/well nourished HEAD AND FACE: Normocephalic/atraumatic EYES: EOMI/PERRL ENMT: Mucous membranes moist NECK: supple no meningeal signs CV: S1/S2 noted, no murmurs/rubs/gallops noted LUNGS: Lungs are clear to auscultation bilaterally, no apparent distress ABDOMEN: soft, nontender, no rebound or guarding GU - no CVA tenderness NEURO: Pt is awake/alert, moves all extremitiesx4 EXTREMITIES: pulses normal, full ROM SKIN: warm, color normal PSYCH: no abnormalities of mood noted   ED Course  Procedures  Labs Reviewed  BASIC METABOLIC PANEL  CBC  DIFFERENTIAL  I-STAT TROPONIN I   1:12 AM Pt improved Pain free D/w dr Maryelizabeth Kaufmann, cardiology will see patient EMS rhythm strip not availabel   MDM  Nursing notes reviewed and considered in documentation All labs/vitals reviewed and considered xrays reviewed and considered    Date: 01/02/2011  Rate: 100  Rhythm: sinus tachycardia  QRS Axis: left  Intervals: normal  ST/T Wave abnormalities: nonspecific ST changes  Conduction Disutrbances:none  Narrative Interpretation:   Old EKG Reviewed: unchanged          Joya Gaskins, MD 01/03/11 [phone]

## 2011-01-03 ENCOUNTER — Other Ambulatory Visit: Payer: Self-pay

## 2011-01-03 DIAGNOSIS — Z8669 Personal history of other diseases of the nervous system and sense organs: Secondary | ICD-10-CM | POA: Insufficient documentation

## 2011-01-03 DIAGNOSIS — I10 Essential (primary) hypertension: Secondary | ICD-10-CM

## 2011-01-03 DIAGNOSIS — E785 Hyperlipidemia, unspecified: Secondary | ICD-10-CM | POA: Insufficient documentation

## 2011-01-03 DIAGNOSIS — E039 Hypothyroidism, unspecified: Secondary | ICD-10-CM | POA: Insufficient documentation

## 2011-01-03 DIAGNOSIS — F329 Major depressive disorder, single episode, unspecified: Secondary | ICD-10-CM | POA: Insufficient documentation

## 2011-01-03 DIAGNOSIS — G5600 Carpal tunnel syndrome, unspecified upper limb: Secondary | ICD-10-CM | POA: Insufficient documentation

## 2011-01-03 LAB — CARDIAC PANEL(CRET KIN+CKTOT+MB+TROPI)
CK, MB: 2.6 ng/mL (ref 0.3–4.0)
Relative Index: INVALID (ref 0.0–2.5)
Total CK: 56 U/L (ref 7–177)

## 2011-01-03 LAB — CBC
HCT: 34.5 % — ABNORMAL LOW (ref 36.0–46.0)
Hemoglobin: 11.5 g/dL — ABNORMAL LOW (ref 12.0–15.0)
MCHC: 33.3 g/dL (ref 30.0–36.0)
RBC: 3.88 MIL/uL (ref 3.87–5.11)
WBC: 6.4 10*3/uL (ref 4.0–10.5)

## 2011-01-03 LAB — BASIC METABOLIC PANEL
BUN: 16 mg/dL (ref 6–23)
BUN: 15 mg/dL (ref 6–23)
CO2: 24 mEq/L (ref 19–32)
Chloride: 106 mEq/L (ref 96–112)
Chloride: 110 mEq/L (ref 96–112)
Creatinine, Ser: 0.99 mg/dL (ref 0.50–1.10)
GFR calc Af Amer: 73 mL/min — ABNORMAL LOW (ref >90)
GFR calc non Af Amer: 63 mL/min — ABNORMAL LOW (ref >90)
Glucose, Bld: 108 mg/dL — ABNORMAL HIGH (ref 70–99)
Potassium: 4 mEq/L (ref 3.5–5.1)

## 2011-01-03 LAB — MAGNESIUM: Magnesium: 1.4 mg/dL — ABNORMAL LOW (ref 1.5–2.5)

## 2011-01-03 LAB — CREATININE, SERUM
Creatinine, Ser: 0.98 mg/dL (ref 0.50–1.10)
GFR calc Af Amer: 69 mL/min — ABNORMAL LOW (ref >90)
GFR calc non Af Amer: 60 mL/min — ABNORMAL LOW (ref >90)

## 2011-01-03 LAB — POCT I-STAT TROPONIN I: Troponin i, poc: 0.03 ng/mL (ref 0.00–0.08)

## 2011-01-03 MED ORDER — ONDANSETRON HCL 4 MG/2ML IJ SOLN: 4.0000 mg | Freq: Four times a day (QID) | INTRAMUSCULAR | Status: DC | PRN

## 2011-01-03 MED ORDER — POTASSIUM CHLORIDE CRYS ER 20 MEQ PO TBCR
40.0000 meq | EXTENDED_RELEASE_TABLET | Freq: Once | ORAL | Status: AC
  Administered 2011-01-03: 40 meq via ORAL
  Filled 2011-01-03: qty 2

## 2011-01-03 MED ORDER — HEPARIN SODIUM (PORCINE) 5000 UNIT/ML IJ SOLN
5000.0000 [IU] | Freq: Three times a day (TID) | INTRAMUSCULAR | Status: DC
  Administered 2011-01-03 (×2): 5000 [IU] via SUBCUTANEOUS
  Filled 2011-01-03 (×4): qty 1

## 2011-01-03 MED ORDER — METOPROLOL TARTRATE 1 MG/ML IV SOLN
5.0000 mg | Freq: Once | INTRAVENOUS | Status: AC
  Administered 2011-01-03: 5 mg via INTRAVENOUS
  Filled 2011-01-03: qty 5

## 2011-01-03 MED ORDER — ACETAMINOPHEN 325 MG PO TABS: 650.0000 mg | ORAL_TABLET | ORAL | Status: DC | PRN

## 2011-01-03 MED ORDER — POTASSIUM CHLORIDE 20 MEQ PO PACK
40.0000 meq | PACK | Freq: Every day | ORAL | Status: DC
  Filled 2011-01-03: qty 2

## 2011-01-03 MED ORDER — LEVETIRACETAM 500 MG PO TABS
500.0000 mg | ORAL_TABLET | Freq: Two times a day (BID) | ORAL | Status: DC
  Administered 2011-01-03: 500 mg via ORAL
  Filled 2011-01-03 (×2): qty 1

## 2011-01-03 MED ORDER — CALCIUM CARBONATE-VITAMIN D 500-200 MG-UNIT PO TABS: 1.0000 | ORAL_TABLET | Freq: Every day | ORAL | Status: DC

## 2011-01-03 MED ORDER — METOPROLOL SUCCINATE ER 50 MG PO TB24
50.0000 mg | ORAL_TABLET | Freq: Every day | ORAL | Status: DC
  Administered 2011-01-03: 25 mg via ORAL

## 2011-01-03 MED ORDER — CALCITRIOL 0.5 MCG PO CAPS
0.5000 ug | ORAL_CAPSULE | Freq: Every day | ORAL | Status: DC
  Administered 2011-01-03: 0.5 ug via ORAL
  Filled 2011-01-03: qty 1

## 2011-01-03 MED ORDER — CLOPIDOGREL BISULFATE 75 MG PO TABS
75.0000 mg | ORAL_TABLET | Freq: Every day | ORAL | Status: DC
  Administered 2011-01-03: 75 mg via ORAL
  Filled 2011-01-03 (×2): qty 1

## 2011-01-03 MED ORDER — METOPROLOL SUCCINATE ER 25 MG PO TB24: 25.0000 mg | ORAL_TABLET | Freq: Every day | ORAL | Status: DC

## 2011-01-03 MED ORDER — DOXAZOSIN MESYLATE 4 MG PO TABS
4.0000 mg | ORAL_TABLET | Freq: Every day | ORAL | Status: DC
  Filled 2011-01-03: qty 1

## 2011-01-03 MED ORDER — DILTIAZEM HCL ER COATED BEADS 240 MG PO TB24
240.0000 mg | ORAL_TABLET | Freq: Every day | ORAL | Status: DC
  Administered 2011-01-03: 240 mg via ORAL
  Filled 2011-01-03 (×3): qty 1

## 2011-01-03 MED ORDER — POTASSIUM CHLORIDE CRYS ER 20 MEQ PO TBCR
40.0000 meq | EXTENDED_RELEASE_TABLET | Freq: Every day | ORAL | Status: DC
  Administered 2011-01-03: 40 meq via ORAL
  Filled 2011-01-03: qty 2

## 2011-01-03 MED ORDER — ASPIRIN 81 MG PO CHEW
324.0000 mg | CHEWABLE_TABLET | ORAL | Status: AC
  Administered 2011-01-03: 324 mg via ORAL
  Filled 2011-01-03: qty 4

## 2011-01-03 MED ORDER — PANTOPRAZOLE SODIUM 40 MG PO TBEC
40.0000 mg | DELAYED_RELEASE_TABLET | Freq: Two times a day (BID) | ORAL | Status: DC
  Administered 2011-01-03: 40 mg via ORAL
  Filled 2011-01-03: qty 1

## 2011-01-03 MED ORDER — VITAMIN E 180 MG (400 UNIT) PO CAPS
400.0000 [IU] | ORAL_CAPSULE | Freq: Every day | ORAL | Status: DC
Start: 2011-01-03 — End: 2011-01-03
  Administered 2011-01-03: 400 [IU] via ORAL
  Filled 2011-01-03: qty 1

## 2011-01-03 MED ORDER — ASPIRIN EC 81 MG PO TBEC: 81.0000 mg | DELAYED_RELEASE_TABLET | Freq: Every day | ORAL | Status: DC

## 2011-01-03 MED ORDER — CALCIUM-VITAMIN D 600-125 MG-UNIT PO TABS: 1.0000 | ORAL_TABLET | Freq: Every day | ORAL | Status: DC

## 2011-01-03 MED ORDER — ASPIRIN 81 MG PO TBEC: 81.0000 mg | DELAYED_RELEASE_TABLET | Freq: Every day | ORAL | Status: AC

## 2011-01-03 MED ORDER — NITROGLYCERIN 0.4 MG SL SUBL: 0.4000 mg | SUBLINGUAL_TABLET | SUBLINGUAL | Status: DC | PRN

## 2011-01-03 MED ORDER — SODIUM CHLORIDE 0.9 % IV SOLN
INTRAVENOUS | Status: AC
  Administered 2011-01-03 (×2): via INTRAVENOUS

## 2011-01-03 MED ORDER — METOPROLOL SUCCINATE ER 25 MG PO TB24
25.0000 mg | ORAL_TABLET | Freq: Every day | ORAL | Status: DC
  Administered 2011-01-03: 25 mg via ORAL
  Filled 2011-01-03 (×2): qty 1

## 2011-01-03 MED ORDER — HYDROCODONE-ACETAMINOPHEN 5-325 MG PO TABS: 1.0000 | ORAL_TABLET | Freq: Four times a day (QID) | ORAL | Status: DC | PRN

## 2011-01-03 MED ORDER — ROSUVASTATIN CALCIUM 10 MG PO TABS
10.0000 mg | ORAL_TABLET | Freq: Every day | ORAL | Status: DC
  Filled 2011-01-03: qty 1

## 2011-01-03 MED ORDER — LEVOTHYROXINE SODIUM 100 MCG PO TABS
100.0000 ug | ORAL_TABLET | Freq: Every day | ORAL | Status: DC
Start: 2011-01-03 — End: 2011-01-03
  Administered 2011-01-03: 100 ug via ORAL
  Filled 2011-01-03: qty 1

## 2011-01-03 MED ORDER — ASPIRIN 300 MG RE SUPP
300.0000 mg | RECTAL | Status: AC
Start: 2011-01-03 — End: 2011-01-03
  Filled 2011-01-03: qty 1

## 2011-01-03 NOTE — ED Notes (Signed)
Received pt. From bed 4 via stretcher NAD noted

## 2011-01-03 NOTE — Discharge Summary (Signed)
Physician Discharge Summary  Patient ID: [name] MRN: [id number] DOB/AGE: 1945-11-10 65 y.o.  Admit date: 01/02/2011 Discharge date: 01/03/2011  Primary Discharge Diagnosis: 1. Supraventricular tachycardia-resolved  Secondary Discharge Diagnosis: 2. Chest pain due to SVT 3. Coronary artery disease with previous drug-eluting stent of the right coronary artery 4. Hypertension borderline control 5. Hyperlipidemia 6. Hypothyroidism 7. history of seizures 8. GERD 9. Mild anemia 10.  Hospital Course:   Patient has a history of coronary artery disease and had a drug-eluting stent placed in June. She had been doing well but had the onset of diarrhea and was unable to take her medications the day prior to admission. She then developed chest discomfort and a rapid heartbeat and was found to have supraventricular tachycardia. She was given adenosine in route by the name is with conversion of the tachycardia. With this her symptoms resolved and she was admitted for observation.  Her enzymes remain negative and she had no changes on EKG. Her medicines were restarted her blood pressure came under better control. It was opted to let her go home and resume her treatment as an outpatient. Her metoprolol was increased to 25 mg daily instead of 12.5 mg daily. She is to call to Dr. Mayford Knife in one week.  Discharge Exam: Blood pressure 143/86, pulse 80, temperature 97.5 F (36.4 C), temperature source Oral, resp. rate 18, height 4\' 11"  (1.499 m), weight 66.452 kg (146 lb 8 oz), SpO2 97.00%.   Lungs clear, cardiac exam unremarkable  Labs: CBC:   Lab Results  Component Value Date   WBC 6.4 01/03/2011   HGB 11.5* 01/03/2011   HCT 34.5* 01/03/2011   MCV 88.9 01/03/2011   PLT 207 01/03/2011   CMP:  Lab 01/03/11 1125  NA 142  K 4.0  CL 110  CO2 24  BUN 15  CREATININE 0.94  CALCIUM 7.5*  PROT --  BILITOT --  ALKPHOS --  ALT --  AST --  GLUCOSE 98   Lipid Panel     Component  Value Date/Time   CHOL  Value: 177        ATP III CLASSIFICATION:  <200     mg/dL   Desirable  409-811  mg/dL   Borderline High  >=914    mg/dL   High        78/29/5621 0450   TRIG 45 11/23/2009 0450   HDL 78 11/23/2009 0450   CHOLHDL 2.3 11/23/2009 0450   VLDL 9 11/23/2009 0450   LDLCALC  Value: 90        Total Cholesterol/HDL:CHD Risk Coronary Heart Disease Risk Table                     Men   Women  1/2 Average Risk   3.4   3.3  Average Risk       5.0   4.4  2 X Average Risk   9.6   7.1  3 X Average Risk  23.4   11.0        Use the calculated Patient Ratio above and the CHD Risk Table to determine the patient's CHD Risk.        ATP III CLASSIFICATION (LDL):  <100     mg/dL   Optimal  308-657  mg/dL   Near or Above                    Optimal  130-159  mg/dL   Borderline  846-962  mg/dL   High  >284     mg/dL   Very High 13/24/4010 0450   Cardiac Enzymes:  Basename 01/03/11 1125 01/03/11 0903  CKTOTAL 56 56  CKMB 2.6 2.6  CKMBINDEX -- --  TROPONINI <0.30 <0.30   Radiology: Right basilar atelectasis  EKG: Left axis deviation, poor with progression. Subsequent telemetry did not show any bradycardia or tachycardia.  Discharge Medications:  [place], [place]  Home Medication Instructions UVO:[id number]   Printed on:01/03/11 1613  Medication Information                    calcitRIOL (ROCALTROL) 0.5 MCG capsule Take 0.5 mcg by mouth daily.             Calcium-Vitamin D 600-125 MG-UNIT TABS Take 1 tablet by mouth daily.             diltiazem (CARDIZEM LA) 240 MG 24 hr tablet Take 240 mg by mouth daily.             rosuvastatin (CRESTOR) 10 MG tablet Take 10 mg by mouth daily.             doxazosin (CARDURA) 4 MG tablet Take 4 mg by mouth at bedtime.             potassium chloride (KLOR-CON) 20 MEQ packet 40 mEq daily. Take two by mouth once daily           levETIRAcetam (KEPPRA) 500 MG tablet Take 500 mg by mouth every 12 (twelve) hours.             levothyroxine (SYNTHROID,  LEVOTHROID) 100 MCG tablet Take 100 mcg by mouth daily.             nitroGLYCERIN (NITROSTAT) 0.4 MG SL tablet Place 0.4 mg under the tongue every 5 (five) minutes as needed.             pantoprazole (PROTONIX) 40 MG tablet Take 40 mg by mouth 2 (two) times daily.             clopidogrel (PLAVIX) 75 MG tablet Take 75 mg by mouth daily.             HYDROcodone-acetaminophen (VICODIN) 5-500 MG per tablet Take 1 tablet by mouth every 6 (six) hours as needed.             vitamin E 400 UNIT capsule Take 400 Units by mouth daily.             aspirin EC 81 MG EC tablet Take 1 tablet (81 mg total) by mouth daily.           metoprolol succinate (TOPROL-XL) 25 MG 24 hr tablet Take 1 tablet (25 mg total) by mouth daily.             Followup plans and appointments: See Dr. Mayford Knife for followup in one week.      Signed: Darden Palmer. MD [facility] 01/03/2011, 4:13 PM

## 2011-01-03 NOTE — Progress Notes (Signed)
Pt. Discharged 01/03/2011  5:53 PM Discharge instructions reviewed with patient/family. Patient/family verbalized understanding. All Rx's given. Questions answered as needed. Pt. Discharged to home with family/self. Taken off unit via W/C. [name]

## 2011-01-03 NOTE — ED Notes (Signed)
Paged Dr Lonni Fix  fellow to [phone]

## 2011-01-03 NOTE — H&P (Signed)
Primary Cardiologist: Dr. Armanda Magic [facility])  Patient Location: evaluated in the ED Bed 4  Chief Complaint: Chest pain, palpitations  HPI: [name] is a 65 year old woman with a history of coronary artery disease requiring PCI to her RCA in 2011 and again in June 2012, SVT, and irritable bowel syndrome who presents after an episode of chest pressure this afternoon.   Since her PCI in June 2012, she has done well with no chest symptoms prior to today (Saturday). On Friday, she and her husband at [place] food for lunch, which the patient thinks unsettled her stomach. She developed loose stools overnight on Friday and into Saturday morning, with frequent trip to the bathroom with diarrhea. She felt poorly enough on Saturday morning that she did not take any of her regularly scheduled prescription medications (including her beta-blocker and calcium channel blocker). In the late afternoon, she began to feel chest pressure which worsened, along with a feeling of palpitations. She felt fatigued and experienced some diaphoresis while resting on the couch during this episode. EMS was subsequently called. On their evaluation, she was reportedly found to be an SVT. She was given IV adenosine in the field with termination of the SVT and a return to normal sinus rhythm. With her conversion to sinus rhythm, the patient's chest pressure dissipated.  She currently feels well and denies any chest pain. She additionally denies dyspnea, nausea, PND, orthopnea, leg swelling, or abdominal distention. She denies abdominal pain or cramping. She has not had any further diarrhea since about noon on Saturday.    Past Medical History  Diagnosis Date  . Irritable bowel syndrome   . Esophageal reflux   . Dysphagia, unspecified   . Hypertension   . Depression   . Hypothyroidism   . Myocardial infarction 11/2009  . Seizures   . Iron deficiency anemia   Additional PMHx: Repeat RCA PCI performed in June 2012  Past  Surgical History  Procedure Date  . Breast surgery 2010    right  . Coronary angioplasty with stent placement     drug-eluting stent w/ ion stents to the distal right coronary artery    Family History  Problem Relation Age of Onset  . Breast cancer Sister   . Breast cancer      niece  . Heart disease Father   . Colon cancer Neg Hx   . Heart attack Mother     Social History:  Current non-smoker (quit) Married, acconpanied by husband tonight in the ED  ROS: As per HPI; otherwise is comprehensively negative  Allergies: No Known Allergies  Medications: Diltiazem 240mg  Toprol XL 12.5mg  daily ASA 325mg  Clopidogrel 75mg  daily KlorCon daily Levoxyl 100 mcg daily Pantoprazole 40mg   Rosuvastatin 10mg  daily Keppra 500mg  BID Vicodin PRN pain  Exam Afebrile BP: 118/78 HR 84 RR 14 97% RA GEN no acute distress, breathing comfortably without accessory muscle use NECK supple, no JVD CHEST clear to auscultation bilaterally CARDIAC S1 S2, regular, no murmurs appreciated ABD soft, non-tender, nondistended EXT warm, well-perfused, no edema NEURO alert & oriented x3 SKIN warm & dry   Results for orders placed during the hospital encounter of 01/02/11 (from the past 48 hour(s))  BASIC METABOLIC PANEL     Status: Abnormal   Collection Time   01/02/11 11:16 PM      Component Value Range Comment   Sodium 140  135 - 145 (mEq/L)    Potassium 3.3 (*) 3.5 - 5.1 (mEq/L)    Chloride  106  96 - 112 (mEq/L)    CO2 24  19 - 32 (mEq/L)    Glucose, Bld 108 (*) 70 - 99 (mg/dL)    BUN 16  6 - 23 (mg/dL)    Creatinine, Ser 1.61  0.50 - 1.10 (mg/dL)    Calcium 7.6 (*) 8.4 - 10.5 (mg/dL)    GFR calc non Af Amer 59 (*) >90 (mL/min)    GFR calc Af Amer 68 (*) >90 (mL/min)   CBC     Status: Normal   Collection Time   01/02/11 11:16 PM      Component Value Range Comment   WBC 7.6  4.0 - 10.5 (K/uL)    RBC 4.09  3.87 - 5.11 (MIL/uL)    Hemoglobin 12.4  12.0 - 15.0 (g/dL)    HCT  09.6  04.5 - 40.9 (%)    MCV 89.0  78.0 - 100.0 (fL)    MCH 30.3  26.0 - 34.0 (pg)    MCHC 34.1  30.0 - 36.0 (g/dL)    RDW 81.1  91.4 - 78.2 (%)    Platelets 206  150 - 400 (K/uL)   DIFFERENTIAL     Status: Normal   Collection Time   01/02/11 11:16 PM      Component Value Range Comment   Neutrophils Relative 49  43 - 77 (%)    Neutro Abs 3.8  1.7 - 7.7 (K/uL)    Lymphocytes Relative 38  12 - 46 (%)    Lymphs Abs 2.9  0.7 - 4.0 (K/uL)    Monocytes Relative 11  3 - 12 (%)    Monocytes Absolute 0.8  0.1 - 1.0 (K/uL)    Eosinophils Relative 2  0 - 5 (%)    Eosinophils Absolute 0.1  0.0 - 0.7 (K/uL)    Basophils Relative 0  0 - 1 (%)    Basophils Absolute 0.0  0.0 - 0.1 (K/uL)   POCT I-STAT TROPONIN I     Status: Normal   Collection Time   01/02/11 11:31 PM      Component Value Range Comment   Troponin i, poc 0.02  0.00 - 0.08 (ng/mL)    Comment 3            POCT I-STAT TROPONIN I     Status: Normal   Collection Time   01/03/11  1:40 AM      Component Value Range Comment   Troponin i, poc 0.03  0.00 - 0.08 (ng/mL)    Comment 3             CXR: no edema evident; no focal infiltrate  ECG: unfortunately there are no strips form the field available for our review; ECG here shows NSR with no ST segment deviation present  Assessment/Plan  65 year old woman with known CAD requiring RCA PCI in June 2012 and a history of paroxysmal SVT who presents after what appears to have been a symptomatic episode of SVT manifesting as chest pressure and diaphoresis. She almost certainly experienced angina earlier today, and I suspect that it was primary demand ischemia, given the near immediate resolution of her symptoms upon SVT termination with adenosine in the field by EMS. Her history suggests a potential trigger for her SVT paroxysm: relative dehydration/hypokalemia resulting from her diarrheal episodes in past 24 hours, together with her not taking her Saturday morning medications (specifically,  her beta-blocker and calcium-channel blocker). She is reassuringly without rest pain and she thus  far has no electrocardiographic or enzymatic evidence of ongoing ischemia.   I think the key will be addressing the underlying trigger, correcting her dehydration and electrolyte abnormalities, and re-instituting her home medications. Because of her CAD history, we will trend her cardiac biomarkers for 12-18 hours.   1) Chest pain likely resulting from demand ischemia in the setting of SVT paroxysm - Re-institution of home Metoprolol and Diltiazem - Correction of her hypokalemia, with gentle IV hydration (1 Liter normal saline over 6-7 ours) - Will trend cardiac biomarkers which I suspect will offer [place] reassurance - If she remains in sinus, is diarrhea-free and taking good PO, and feels well (chest pain-free) upon ambulation in the morning (Sunday), then I suspect she will not need any further cardiac testing during this admission. Her story and the data thus far suggest demand-driven ischemia via an SVT paroxysm triggered by hypokalemia and dehydration (resulting from self-limited diarrheal illness).   - As a result, I do not think she warrants systemic anti-coagulation. Should her symptoms, enzymes and/or ECGs evolve, then we can re-visit this.  2) CAD: PCI in June 2012 - Continue dual anti-platelet therapy (ASA, Clopidogrel), statin therapy  I explained the plan to the patient and to her husband. Both voiced understanding. Their questions were answered to the best of my ability.    Jasmine Pontes, MD Cardiology Fellow On-Call

## 2011-01-03 NOTE — Progress Notes (Addendum)
Subjective:  Patient with previous drug-eluting stents to the mid and distal right coronary artery the last one being put in in June. She developed diarrhea yesterday but has had no recurrent diarrhea since then. She was in SVT that converted with adenosine last night. She has a previous history of this. She feels good this morning with no recurrent diarrhea and has no recurrent chest pain. 2 troponins are negative. She did not begin her IV fluids until early this morning because of difficulty getting IV access.  Objective:  Vital Signs in the last 24 hours: BP 170/92  Pulse 72  Temp(Src) 97.9 F (36.6 C) (Oral)  Resp 18  Ht 4\' 11"  (1.499 m)  Wt 66.452 kg (146 lb 8 oz)  BMI 29.59 kg/m2  SpO2 95%  Physical Exam: Pleasant white female in no acute distress Lungs:  Clear to A&P Cardiac:  Regular rhythm, normal S1 and S2, no S3 Abdomen:  Soft, nontender, no masses Extremities:  No edema present  Lab Results: Basic Metabolic Panel:  Basename 01/03/11 0905 01/02/11 2316  NA -- 140  K -- 3.3*  CL -- 106  CO2 -- 24  GLUCOSE -- 108*  BUN -- 16  CREATININE 0.98 0.99   CBC:  Basename 01/03/11 0905 01/02/11 2316  WBC 6.4 7.6  NEUTROABS -- 3.8  HGB 11.5* 12.4  HCT 34.5* 36.4  MCV 88.9 89.0  PLT 207 206   Cardiac Enzymes:  Basename 01/03/11 0903  CKTOTAL 56  CKMB 2.6  CKMBINDEX --  TROPONINI <0.30    Telemetry: Currently in normal sinus rhythm  Assessment/Plan:  1. Supraventricular tachycardia-resolved 2. Coronary artery disease with previous drug-eluting stent with negative troponins 3. Hypertension currently not well controlled and may be due to not taking her medicines yesterday  Recommendations:  Were to give her some IV fluids and if she is taking by mouth this change her to a saline lock. I asked her to have her additional medications and will re monitor her blood pressure. If she is stable she may be able to go home later on today.     Darden Palmer.  MD [facility] 01/03/2011, 11:21 AM

## 2011-01-04 MED FILL — Metoprolol Succinate Tab ER 24HR 25 MG (Tartrate Equiv): ORAL | Qty: 1 | Status: AC

## 2011-02-22 ENCOUNTER — Encounter: Payer: Self-pay | Admitting: Internal Medicine

## 2011-02-22 ENCOUNTER — Ambulatory Visit (INDEPENDENT_AMBULATORY_CARE_PROVIDER_SITE_OTHER): Payer: BC Managed Care – PPO | Admitting: Internal Medicine

## 2011-02-22 VITALS — BP 132/68 | HR 64 | Ht 59.0 in | Wt 151.2 lb

## 2011-02-22 DIAGNOSIS — K219 Gastro-esophageal reflux disease without esophagitis: Secondary | ICD-10-CM

## 2011-02-22 DIAGNOSIS — R1310 Dysphagia, unspecified: Secondary | ICD-10-CM

## 2011-02-22 DIAGNOSIS — I2510 Atherosclerotic heart disease of native coronary artery without angina pectoris: Secondary | ICD-10-CM

## 2011-02-22 MED ORDER — PANTOPRAZOLE SODIUM 40 MG PO TBEC: 40.0000 mg | DELAYED_RELEASE_TABLET | Freq: Two times a day (BID) | ORAL | Status: DC

## 2011-02-22 NOTE — Progress Notes (Signed)
HISTORY OF PRESENT ILLNESS:  [name] is a 66 y.o. female with multiple significant medical problems including hypertension, hypothyroidism, dyslipidemia, coronary artery disease with prior myocardial infarction and coronary artery stent placement for which she is on chronic aspirin and Plavix, and GERD complicated by peptic stricture. She presents today with worsening intermittent solid food dysphagia, and vague "choking" sensation. She has had these complaints chronically.. The patient was last seen in the office November 2012 4 reflux and dysphagia. It was elected to forego esophageal dilation as she was on aspirin and Plavix for recent coronary artery stent placement. The plan was for her to see her cardiologist this March, and when feasible, proceed with dilation off Plavix.  She presents now with worsening intermittent solid food dysphagia. Also, complains of choking or coughing when talking as well as a sensation of her "throat closing up" when she lies down at night. These are not new symptoms. She continues on pantoprazole 40 mg twice a day to control reflux symptoms. Despite this, occasional breakthrough. She requests a refill of her Protonix. She has undergone coronary artery stenting on several occasions, most recently in May of 2012. She sees Dr. Armanda Magic of [facility]. Her GI review of systems is otherwise negative. Her last upper endoscopy with esophageal dilation, for an obvious peptic stricture, was in August of 2009. She was dilated with a 54 [place] Maloney dilator at that time.   REVIEW OF SYSTEMS:  All non-GI ROS negative except for irregular heart rate  Past Medical History  Diagnosis Date  . Irritable bowel syndrome   . Esophageal reflux   . Dysphagia, unspecified   . Hypertension   . Depression   . Hypothyroidism   . Myocardial infarction 11/2009  . Seizures   . Iron deficiency anemia   . Internal hemorrhoids     Past Surgical History  Procedure Date  .  Breast surgery 2010    right  . Coronary angioplasty with stent placement     drug-eluting stent w/ ion stents to the distal right coronary artery    Social History [name]  reports that she has quit smoking. She has never used smokeless tobacco. She reports that she does not drink alcohol or use illicit drugs.  family history includes Breast cancer in her sister and unspecified family member; Heart attack in her mother; and Heart disease in her father.  There is no history of Colon cancer.  No Known Allergies     PHYSICAL EXAMINATION: Vital signs: BP 132/68  Pulse 64  Ht 4\' 11"  (1.499 m)  Wt 151 lb 3.2 oz (68.584 kg)  BMI 30.54 kg/m2 General: Well-developed, well-nourished, no acute distress HEENT: Sclerae are anicteric, conjunctiva pink. Oral mucosa intact Lungs: Clear Heart: Regular Abdomen: soft, nontender, nondistended, no obvious ascites, no peritoneal signs, normal bowel sounds. No organomegaly. Extremities: No edema Psychiatric: alert and oriented x3. Cooperative   ASSESSMENT:  #1. Dysphagia. Patient has known peptic stricture as well as motility disorder. Her problem is stable. I explained to her that esophageal dilation would help intermittent solid food dysphagia but not complaints of choking sensation or her throat closing up. #2. GERD #3. Multiple medical problems including coronary artery disease with prior stent placement in the need for aspirin/Plavix therapy   PLAN:  #1. Reflux precautions #2. Refill PPI #3. Schedule elective upper endoscopy with esophageal dilation, only after the patient's cardiologist approves the patient coming off Plavix for 5-7 days. She could remain on aspirin. #4.  Chew carefully

## 2011-02-22 NOTE — Patient Instructions (Signed)
We have sent the following medications to your pharmacy for you to pick up at your convenience:   Protonix  

## 2011-03-09 ENCOUNTER — Other Ambulatory Visit: Payer: Self-pay | Admitting: Interventional Cardiology

## 2011-03-10 ENCOUNTER — Encounter (HOSPITAL_COMMUNITY)
Admission: RE | Disposition: A | Discharge status: Home / Self Care | Payer: Self-pay | Source: Ambulatory Visit | Attending: Interventional Cardiology

## 2011-03-10 ENCOUNTER — Encounter (HOSPITAL_BASED_OUTPATIENT_CLINIC_OR_DEPARTMENT_OTHER)
Admission: RE | Disposition: A | Discharge status: Hospice | Payer: Self-pay | Source: Ambulatory Visit | Attending: Interventional Cardiology

## 2011-03-10 ENCOUNTER — Ambulatory Visit (HOSPITAL_COMMUNITY)
Admission: RE | Admit: 2011-03-10 | Discharge: 2011-03-10 | Disposition: A | Discharge status: Home / Self Care | Payer: BC Managed Care – PPO | Source: Ambulatory Visit | Attending: Interventional Cardiology | Admitting: Interventional Cardiology

## 2011-03-10 ENCOUNTER — Inpatient Hospital Stay (HOSPITAL_BASED_OUTPATIENT_CLINIC_OR_DEPARTMENT_OTHER)
Admission: RE | Admit: 2011-03-10 | Discharge: 2011-03-10 | Disposition: A | Discharge status: Hospice | Payer: BC Managed Care – PPO | Source: Ambulatory Visit | Attending: Interventional Cardiology | Admitting: Interventional Cardiology

## 2011-03-10 DIAGNOSIS — Z9861 Coronary angioplasty status: Secondary | ICD-10-CM | POA: Insufficient documentation

## 2011-03-10 DIAGNOSIS — R079 Chest pain, unspecified: Secondary | ICD-10-CM | POA: Insufficient documentation

## 2011-03-10 DIAGNOSIS — I2510 Atherosclerotic heart disease of native coronary artery without angina pectoris: Secondary | ICD-10-CM | POA: Insufficient documentation

## 2011-03-10 HISTORY — PX: CORONARY ANGIOGRAM: SHX5466

## 2011-03-10 LAB — POCT ACTIVATED CLOTTING TIME: Activated Clotting Time: 171 seconds

## 2011-03-10 SURGERY — JV LEFT HEART CATHETERIZATION WITH CORONARY ANGIOGRAM
Anesthesia: Moderate Sedation

## 2011-03-10 SURGERY — PERCUTANEOUS CORONARY STENT INTERVENTION (PCI-S)
Anesthesia: LOCAL

## 2011-03-10 SURGERY — CORONARY ANGIOGRAM

## 2011-03-10 MED ORDER — LIDOCAINE HCL (PF) 1 % IJ SOLN
INTRAMUSCULAR | Status: AC
  Filled 2011-03-10: qty 30

## 2011-03-10 MED ORDER — ADENOSINE 12 MG/4ML IV SOLN
12.0000 mL | Freq: Once | INTRAVENOUS | Status: DC
  Filled 2011-03-10: qty 12

## 2011-03-10 MED ORDER — SODIUM CHLORIDE 0.9 % IV SOLN: 250.0000 mL | INTRAVENOUS | Status: DC | PRN

## 2011-03-10 MED ORDER — MORPHINE SULFATE 4 MG/ML IJ SOLN: 1.0000 mg | INTRAMUSCULAR | Status: DC | PRN

## 2011-03-10 MED ORDER — NITROGLYCERIN 0.2 MG/ML ON CALL CATH LAB
INTRAVENOUS | Status: AC
  Filled 2011-03-10: qty 1

## 2011-03-10 MED ORDER — DIAZEPAM 5 MG PO TABS
5.0000 mg | ORAL_TABLET | ORAL | Status: AC
  Administered 2011-03-10: 5 mg via ORAL

## 2011-03-10 MED ORDER — FENTANYL CITRATE 0.05 MG/ML IJ SOLN
INTRAMUSCULAR | Status: AC
  Filled 2011-03-10: qty 2

## 2011-03-10 MED ORDER — BIVALIRUDIN 250 MG IV SOLR
INTRAVENOUS | Status: AC
  Filled 2011-03-10: qty 250

## 2011-03-10 MED ORDER — ACETAMINOPHEN 325 MG PO TABS: 650.0000 mg | ORAL_TABLET | ORAL | Status: DC | PRN

## 2011-03-10 MED ORDER — ISOSORBIDE MONONITRATE ER 30 MG PO TB24: 30.0000 mg | ORAL_TABLET | Freq: Every day | ORAL | Status: DC

## 2011-03-10 MED ORDER — ASPIRIN 81 MG PO CHEW
324.0000 mg | CHEWABLE_TABLET | ORAL | Status: AC
  Administered 2011-03-10: 243 mg via ORAL

## 2011-03-10 MED ORDER — SODIUM CHLORIDE 0.9 % IV SOLN: INTRAVENOUS | Status: DC

## 2011-03-10 MED ORDER — SODIUM CHLORIDE 0.9 % IJ SOLN: 3.0000 mL | Freq: Two times a day (BID) | INTRAMUSCULAR | Status: DC

## 2011-03-10 MED ORDER — HEPARIN (PORCINE) IN NACL 2-0.9 UNIT/ML-% IJ SOLN
INTRAMUSCULAR | Status: AC
  Filled 2011-03-10: qty 1000

## 2011-03-10 MED ORDER — ONDANSETRON HCL 4 MG/2ML IJ SOLN: 4.0000 mg | Freq: Four times a day (QID) | INTRAMUSCULAR | Status: DC | PRN

## 2011-03-10 MED ORDER — SODIUM CHLORIDE 0.9 % IJ SOLN: 3.0000 mL | INTRAMUSCULAR | Status: DC | PRN

## 2011-03-10 MED ORDER — MIDAZOLAM HCL 2 MG/2ML IJ SOLN
INTRAMUSCULAR | Status: AC
  Filled 2011-03-10: qty 2

## 2011-03-10 NOTE — OR Nursing (Signed)
Dr Varanasi at bedside to discuss results and treatment plan with pt and family 

## 2011-03-10 NOTE — OR Nursing (Signed)
Pt transported via stretcher to main cath lab

## 2011-03-10 NOTE — Discharge Instructions (Addendum)
No lifting more than 10 lbs for a week.   Groin Site Care Refer to this sheet in the next few weeks. These instructions provide you with information on caring for yourself after your procedure. Your caregiver may also give you more specific instructions. Your treatment has been planned according to current medical practices, but problems sometimes occur. Call your caregiver if you have any problems or questions after your procedure. HOME CARE INSTRUCTIONS  You may shower 24 hours after the procedure. Remove the bandage (dressing) and gently wash the site with plain soap and water. Gently pat the site dry.   Do not apply powder or lotion to the site.   Do not sit in a bathtub, swimming pool, or whirlpool for 5 to 7 days.   No bending, squatting, or lifting anything over 10 pounds (4.5 kg) as directed by your caregiver.   Inspect the site at least twice daily.   Do not drive home if you are discharged the same day of the procedure. Have someone else drive you.   You may drive 24 hours after the procedure unless otherwise instructed by your caregiver.  What to expect:  Any bruising will usually fade within 1 to 2 weeks.   Blood that collects in the tissue (hematoma) may be painful to the touch. It should usually decrease in size and tenderness within 1 to 2 weeks.  SEEK IMMEDIATE MEDICAL CARE IF:  You have unusual pain at the groin site or down the affected leg.   You have redness, warmth, swelling, or pain at the groin site.   You have drainage (other than a small amount of blood on the dressing).   You have chills.   You have a fever or persistent symptoms for more than 72 hours.   You have a fever and your symptoms suddenly get worse.   Your leg becomes pale, cool, tingly, or numb.   You have heavy bleeding from the site. Hold pressure on the site.  Document Released: 01/30/2010 Document Revised: 09/09/2010 Document Reviewed: 01/30/2010 [facility] Patient Information 2012  [place], [place].

## 2011-03-10 NOTE — CV Procedure (Signed)
PROCEDURE:  FFR of the distal RCA.  INDICATIONS:  Angina; CAD  The risks, benefits, and details of the procedure were explained to the patient.  The patient verbalized understanding and wanted to proceed.  Informed written consent was obtained.  PROCEDURE TECHNIQUE:  After Xylocaine anesthesia a 12F sheath was placed in the right femoral artery exchanged from the 4 Fr sheath.  Right coronary angiography was done using a Judkins R4 guide catheter.  Pressure wire placed across the area of disease.   CONTRAST:  Total of 20 cc.  COMPLICATIONS:  None.    HEMODYNAMICS:  Resting FFR 0.97.  After adenosine FFR 0.85.  ANGIOGRAPHIC DATA:   The right coronary artery is a large dominant vessel with a moderate distal stenosis of 50-60%.    IMPRESSIONS:  1. Moderate 50-60%, distal right coronary artery lesion between the two prior stents  which does not appear to be significant by Fractional Flow Reserve (0.85 with adenosine).  RECOMMENDATION:  Will add Imdur to her medical therapy.  She may have vasospasm at the area of moderate atherosclerosis.  Continue aggressive secondary prevention.

## 2011-03-10 NOTE — CV Procedure (Signed)
PROCEDURE:  Left heart catheterization with selective coronary angiography, left ventriculogram.  Abdominal aortogram.  INDICATIONS:  CAD, chest pain  The risks, benefits, and details of the procedure were explained to the patient.  The patient verbalized understanding and wanted to proceed.  Informed written consent was obtained.  PROCEDURE TECHNIQUE:  After Xylocaine anesthesia a 78F sheath was placed in the right femoral artery with a single anterior needle wall stick.   Left coronary angiography was done using a Judkins L4 guide catheter.  Right coronary angiography was done using a 3DRC guide catheter.  Left ventriculography was done using a pigtail catheter.    CONTRAST:  Total of 80 cc.  COMPLICATIONS:  None.    HEMODYNAMICS:  Aortic pressure was 154/80; LV pressure was 157/15; LVEDP 19.  There was no gradient between the left ventricle and aorta.    ANGIOGRAPHIC DATA:   The left main coronary artery is widely patent.  The left anterior descending artery is a medium sized vessel.  There is mild disease proximally and in the mid vessel.  25% lesion after the first diagonal.  Second diagonal patent.  Distal LAD is small but patent.  The left circumflex artery is a small area.  Ramus vessel present is medium sized.  The right coronary artery is a large vessel.  There is diffuse moderate stenosis in the mid vessel.  There is a 50-60% lesion in the distal RCA between the stents and prior to the PDA.  LEFT VENTRICULOGRAM:  Left ventricular angiogram was done in the 30 RAO projection and revealed normal left ventricular wall motion and systolic function with an estimated ejection fraction of 55%.  LVEDP was 19 mmHg.  ABDOMINAL AORTOGRAM:  No abdominal aortic aneurysm.  Widely patent aortoiliac bifurcation.  Single renal arteries bilaterally.  The right renal artery appears widely patent.  There is mild disease in the proximal left renal artery with an early large  branch.  IMPRESSIONS:  1. Normal left main coronary artery. 2. Mild disease in the left anterior descending artery and its branches. 3. Normal left circumflex artery and its branches. 4. Patent stents in the right coronary artery and posterolateral artery.  Between the stents in the distal right coronary artery, there is a 50-60% lesion of unclear significance. 5. Normal left ventricular systolic function.  LVEDP 19 mmHg.  Ejection fraction 55 %.  RECOMMENDATION:  We'll plan for pressure wire of the distal RCA to determine he remained significance of this moderate lesion between the stents.  This will be performed later today.  If it is found that the lesion is not significant, we will intensify medical therapy.

## 2011-03-10 NOTE — H&P (Signed)
  Date of Initial H&P: 03/08/11  History reviewed, patient examined, no change in status, stable for surgery.

## 2011-03-11 MED FILL — Dextrose Inj 5%: INTRAVENOUS | Qty: 50 | Status: AC

## 2011-03-28 ENCOUNTER — Emergency Department (HOSPITAL_COMMUNITY): Payer: BC Managed Care – PPO

## 2011-03-28 ENCOUNTER — Emergency Department (HOSPITAL_COMMUNITY)
Admission: EM | Admit: 2011-03-28 | Discharge: 2011-03-29 | Disposition: A | Discharge status: Home / Self Care | Payer: BC Managed Care – PPO | Attending: Emergency Medicine | Admitting: Emergency Medicine

## 2011-03-28 ENCOUNTER — Encounter (HOSPITAL_COMMUNITY): Payer: Self-pay | Admitting: *Deleted

## 2011-03-28 ENCOUNTER — Other Ambulatory Visit: Payer: Self-pay

## 2011-03-28 DIAGNOSIS — I2510 Atherosclerotic heart disease of native coronary artery without angina pectoris: Secondary | ICD-10-CM | POA: Insufficient documentation

## 2011-03-28 DIAGNOSIS — K219 Gastro-esophageal reflux disease without esophagitis: Secondary | ICD-10-CM | POA: Insufficient documentation

## 2011-03-28 DIAGNOSIS — E039 Hypothyroidism, unspecified: Secondary | ICD-10-CM | POA: Insufficient documentation

## 2011-03-28 DIAGNOSIS — I252 Old myocardial infarction: Secondary | ICD-10-CM | POA: Insufficient documentation

## 2011-03-28 DIAGNOSIS — R319 Hematuria, unspecified: Secondary | ICD-10-CM | POA: Insufficient documentation

## 2011-03-28 DIAGNOSIS — I10 Essential (primary) hypertension: Secondary | ICD-10-CM | POA: Insufficient documentation

## 2011-03-28 DIAGNOSIS — R109 Unspecified abdominal pain: Secondary | ICD-10-CM | POA: Diagnosis not present

## 2011-03-28 DIAGNOSIS — F3289 Other specified depressive episodes: Secondary | ICD-10-CM | POA: Insufficient documentation

## 2011-03-28 DIAGNOSIS — R1110 Vomiting, unspecified: Secondary | ICD-10-CM | POA: Diagnosis not present

## 2011-03-28 DIAGNOSIS — F329 Major depressive disorder, single episode, unspecified: Secondary | ICD-10-CM | POA: Insufficient documentation

## 2011-03-28 DIAGNOSIS — R197 Diarrhea, unspecified: Secondary | ICD-10-CM | POA: Insufficient documentation

## 2011-03-28 DIAGNOSIS — R112 Nausea with vomiting, unspecified: Secondary | ICD-10-CM | POA: Insufficient documentation

## 2011-03-28 HISTORY — DX: Atherosclerotic heart disease of native coronary artery without angina pectoris: I25.10

## 2011-03-28 LAB — CBC
MCH: 29.4 pg (ref 26.0–34.0)
MCHC: 34.1 g/dL (ref 30.0–36.0)
MCV: 86.2 fL (ref 78.0–100.0)
Platelets: 286 10*3/uL (ref 150–400)
RDW: 12.3 % (ref 11.5–15.5)

## 2011-03-28 LAB — DIFFERENTIAL
Basophils Absolute: 0 10*3/uL (ref 0.0–0.1)
Eosinophils Absolute: 0 10*3/uL (ref 0.0–0.7)
Lymphs Abs: 1.8 10*3/uL (ref 0.7–4.0)
Monocytes Absolute: 0.9 10*3/uL (ref 0.1–1.0)

## 2011-03-28 LAB — URINALYSIS, ROUTINE W REFLEX MICROSCOPIC
Glucose, UA: NEGATIVE mg/dL
Leukocytes, UA: NEGATIVE
Protein, ur: NEGATIVE mg/dL

## 2011-03-28 LAB — COMPREHENSIVE METABOLIC PANEL
AST: 18 U/L (ref 0–37)
Albumin: 3.8 g/dL (ref 3.5–5.2)
CO2: 26 mEq/L (ref 19–32)
Calcium: 8.9 mg/dL (ref 8.4–10.5)
Creatinine, Ser: 1.08 mg/dL (ref 0.50–1.10)
GFR calc non Af Amer: 53 mL/min — ABNORMAL LOW (ref >90)
Total Protein: 7.4 g/dL (ref 6.0–8.3)

## 2011-03-28 LAB — POCT I-STAT TROPONIN I: Troponin i, poc: 0 ng/mL (ref 0.00–0.08)

## 2011-03-28 LAB — URINE MICROSCOPIC-ADD ON

## 2011-03-28 MED ORDER — ONDANSETRON HCL 4 MG PO TABS: 4.0000 mg | ORAL_TABLET | Freq: Four times a day (QID) | ORAL | Status: AC

## 2011-03-28 MED ORDER — HYDROMORPHONE HCL PF 1 MG/ML IJ SOLN
1.0000 mg | Freq: Once | INTRAMUSCULAR | Status: AC
  Administered 2011-03-28: 1 mg via INTRAVENOUS
  Filled 2011-03-28: qty 1

## 2011-03-28 MED ORDER — SODIUM CHLORIDE 0.9 % IV BOLUS (SEPSIS)
1000.0000 mL | Freq: Once | INTRAVENOUS | Status: AC
  Administered 2011-03-28: 1000 mL via INTRAVENOUS

## 2011-03-28 MED ORDER — ONDANSETRON HCL 4 MG/2ML IJ SOLN
4.0000 mg | Freq: Once | INTRAMUSCULAR | Status: AC
  Administered 2011-03-28: 4 mg via INTRAVENOUS
  Filled 2011-03-28: qty 2

## 2011-03-28 NOTE — ED Provider Notes (Addendum)
History     CSN: [id number]  Arrival date & time 03/28/11  1941   First MD Initiated Contact with Patient 03/28/11 2047      Chief Complaint  Patient presents with  . Nausea  Was fine at lunch.  Began having diarrhea at 3pm then vomiting.  Pain across central abdomen.  Sharp.  Constant. Feels worse than "tubal pregnancy".  No fever, no melena.  No hematemasis.  No recent travel.  No sick contacts.  No chest pains.    (Consider location/radiation/quality/duration/timing/severity/associated sxs/prior treatment) HPI  Past Medical History  Diagnosis Date  . Irritable bowel syndrome   . Esophageal reflux   . Dysphagia, unspecified   . Hypertension   . Depression   . Hypothyroidism   . Myocardial infarction 11/2009  . Seizures   . Iron deficiency anemia   . Internal hemorrhoids   . Coronary artery disease     Past Surgical History  Procedure Date  . Breast surgery 2010    right  . Coronary angioplasty with stent placement     drug-eluting stent w/ ion stents to the distal right coronary artery    Family History  Problem Relation Age of Onset  . Breast cancer Sister   . Breast cancer      niece  . Heart disease Father   . Colon cancer Neg Hx   . Heart attack Mother     History  Substance Use Topics  . Smoking status: Former Games developer  . Smokeless tobacco: Never Used  . Alcohol Use: No    OB History    Grav Para Term Preterm Abortions TAB SAB Ect Mult Living                  Review of Systems  All other systems reviewed and are negative.    Allergies  Review of patient's allergies indicates no known allergies.  Home Medications   Current Outpatient Rx  Name Route Sig Dispense Refill  . ALPRAZOLAM 0.25 MG PO TABS Oral Take 0.25 mg by mouth at bedtime as needed.    . ASPIRIN 81 MG PO TBEC Oral Take 1 tablet (81 mg total) by mouth daily.    Marland Kitchen CALCITRIOL 0.5 MCG PO CAPS Oral Take 0.5 mcg by mouth daily.      Marland Kitchen CALCIUM-VITAMIN D 600-125 MG-UNIT PO TABS  Oral Take 1 tablet by mouth daily.      Marland Kitchen CLOPIDOGREL BISULFATE 75 MG PO TABS Oral Take 75 mg by mouth daily.      Marland Kitchen DILTIAZEM HCL ER COATED BEADS 240 MG PO TB24 Oral Take 240 mg by mouth daily.      Marland Kitchen HYDROCODONE-ACETAMINOPHEN 5-500 MG PO TABS Oral Take 1 tablet by mouth every 6 (six) hours as needed. For pain    . ISOSORBIDE MONONITRATE ER 30 MG PO TB24 Oral Take 1 tablet (30 mg total) by mouth daily. 30 tablet 11  . LEVETIRACETAM 500 MG PO TABS Oral Take 500 mg by mouth every 12 (twelve) hours.      Marland Kitchen LEVOTHYROXINE SODIUM 100 MCG PO TABS Oral Take 100 mcg by mouth daily.      Marland Kitchen LOSARTAN POTASSIUM-HCTZ 100-25 MG PO TABS Oral Take 1 tablet by mouth 2 (two) times daily.    Marland Kitchen METOPROLOL SUCCINATE ER 25 MG PO TB24 Oral Take 1 tablet (25 mg total) by mouth daily. 30 tablet 12  . PANTOPRAZOLE SODIUM 40 MG PO TBEC Oral Take 1 tablet (40 mg total) by mouth  2 (two) times daily. 60 tablet 11  . PAROXETINE HCL 30 MG PO TABS Oral Take 30 mg by mouth every morning.    Marland Kitchen POTASSIUM CHLORIDE 20 MEQ PO PACK  40 mEq daily. Take two by mouth once daily    . ROSUVASTATIN CALCIUM 10 MG PO TABS Oral Take 10 mg by mouth daily.      Marland Kitchen VITAMIN E 400 UNITS PO CAPS Oral Take 800 Units by mouth daily.     Marland Kitchen NITROGLYCERIN 0.4 MG SL SUBL Sublingual Place 0.4 mg under the tongue every 5 (five) minutes as needed.        BP 94/84  Pulse 70  Temp(Src) 98.6 F (37 C) (Oral)  Resp 19  SpO2 99%  Physical Exam  Nursing note and vitals reviewed. Constitutional: She is oriented to person, place, and time. She appears well-developed and well-nourished.  HENT:  Head: Normocephalic and atraumatic.  Eyes: Conjunctivae and EOM are normal. Pupils are equal, round, and reactive to light.  Neck: Neck supple.  Cardiovascular: Normal rate and regular rhythm.  Exam reveals no gallop and no friction rub.   No murmur heard. Pulmonary/Chest: Breath sounds normal. She has no wheezes. She has no rales. She exhibits no tenderness.    Abdominal: Soft. She exhibits no shifting dullness, no distension, no abdominal bruit, no ascites and no mass. Bowel sounds are increased. There is tenderness. There is no rebound and no guarding.  Musculoskeletal: Normal range of motion.  Neurological: She is alert and oriented to person, place, and time. No cranial nerve deficit. Coordination normal.  Skin: Skin is warm and dry. No rash noted.  Psychiatric: She has a normal mood and affect.    ED Course  Procedures (including critical care time)   Labs Reviewed  CBC  DIFFERENTIAL  COMPREHENSIVE METABOLIC PANEL  LIPASE, BLOOD  URINALYSIS, ROUTINE W REFLEX MICROSCOPIC   No results found.   No diagnosis found.    MDM  Pt is seen and examined;  Initial history and physical completed.  Will follow.     Results for orders placed during the hospital encounter of 03/28/11  CBC      Component Value Range   WBC 11.8 (*) 4.0 - 10.5 (K/uL)   RBC 4.35  3.87 - 5.11 (MIL/uL)   Hemoglobin 12.8  12.0 - 15.0 (g/dL)   HCT 62.1  30.8 - 65.7 (%)   MCV 86.2  78.0 - 100.0 (fL)   MCH 29.4  26.0 - 34.0 (pg)   MCHC 34.1  30.0 - 36.0 (g/dL)   RDW 84.6  96.2 - 95.2 (%)   Platelets 286  150 - 400 (K/uL)  DIFFERENTIAL      Component Value Range   Neutrophils Relative 77  43 - 77 (%)   Lymphocytes Relative 15  12 - 46 (%)   Monocytes Relative 8  3 - 12 (%)   Eosinophils Relative 0  0 - 5 (%)   Basophils Relative 0  0 - 1 (%)   Neutro Abs 9.1 (*) 1.7 - 7.7 (K/uL)   Lymphs Abs 1.8  0.7 - 4.0 (K/uL)   Monocytes Absolute 0.9  0.1 - 1.0 (K/uL)   Eosinophils Absolute 0.0  0.0 - 0.7 (K/uL)   Basophils Absolute 0.0  0.0 - 0.1 (K/uL)   Smear Review MORPHOLOGY UNREMARKABLE    COMPREHENSIVE METABOLIC PANEL      Component Value Range   Sodium 137  135 - 145 (mEq/L)   Potassium 3.7  3.5 - 5.1 (mEq/L)   Chloride 99  96 - 112 (mEq/L)   CO2 26  19 - 32 (mEq/L)   Glucose, Bld 108 (*) 70 - 99 (mg/dL)   BUN 17  6 - 23 (mg/dL)   Creatinine, Ser 4.09   0.50 - 1.10 (mg/dL)   Calcium 8.9  8.4 - 81.1 (mg/dL)   Total Protein 7.4  6.0 - 8.3 (g/dL)   Albumin 3.8  3.5 - 5.2 (g/dL)   AST 18  0 - 37 (U/L)   ALT 10  0 - 35 (U/L)   Alkaline Phosphatase 109  39 - 117 (U/L)   Total Bilirubin 0.2 (*) 0.3 - 1.2 (mg/dL)   GFR calc non Af Amer 53 (*) >90 (mL/min)   GFR calc Af Amer 62 (*) >90 (mL/min)  LIPASE, BLOOD      Component Value Range   Lipase 27  11 - 59 (U/L)  POCT I-STAT TROPONIN I      Component Value Range   Troponin i, poc 0.00  0.00 - 0.08 (ng/mL)   Comment 3            Dg Abd Acute W/chest  03/28/2011  *RADIOLOGY REPORT*  Clinical Data: Vomiting and abdominal pain  ACUTE ABDOMEN SERIES (ABDOMEN 2 VIEW & CHEST 1 VIEW)  Comparison: 09/01/2003  Findings: Heart size is normal.  There is no pleural effusion or pulmonary edema.  Stable scar within the lingular portion of the left lung.  No airspace consolidation.  No abnormally dilated loops of small bowel identified.  Colonic fluid levels are identified within the right colon.  Gas and stool is present within the colon up to the rectum.  IMPRESSION:  1.  Nonobstructive bowel gas pattern.  Original Report Authenticated By: Rosealee Albee, M.D.     Date: 03/28/2011  Rate: 74  Rhythm: normal sinus rhythm  QRS Axis: left  Intervals: normal  ST/T Wave abnormalities: nonspecific ST changes  Conduction Disutrbances:left anterior fascicular block  Narrative Interpretation:   Old EKG Reviewed: unchanged  No sig. Changes compared to Jan 03, 2011 No acute ischemic changes.            Axavier Pressley A. Patrica Duel, MD 03/28/11 2249  Results for orders placed during the hospital encounter of 03/28/11  CBC      Component Value Range   WBC 11.8 (*) 4.0 - 10.5 (K/uL)   RBC 4.35  3.87 - 5.11 (MIL/uL)   Hemoglobin 12.8  12.0 - 15.0 (g/dL)   HCT 91.4  78.2 - 95.6 (%)   MCV 86.2  78.0 - 100.0 (fL)   MCH 29.4  26.0 - 34.0 (pg)   MCHC 34.1  30.0 - 36.0 (g/dL)   RDW 21.3  08.6 - 57.8 (%)    Platelets 286  150 - 400 (K/uL)  DIFFERENTIAL      Component Value Range   Neutrophils Relative 77  43 - 77 (%)   Lymphocytes Relative 15  12 - 46 (%)   Monocytes Relative 8  3 - 12 (%)   Eosinophils Relative 0  0 - 5 (%)   Basophils Relative 0  0 - 1 (%)   Neutro Abs 9.1 (*) 1.7 - 7.7 (K/uL)   Lymphs Abs 1.8  0.7 - 4.0 (K/uL)   Monocytes Absolute 0.9  0.1 - 1.0 (K/uL)   Eosinophils Absolute 0.0  0.0 - 0.7 (K/uL)   Basophils Absolute 0.0  0.0 - 0.1 (K/uL)   Smear Review MORPHOLOGY UNREMARKABLE  COMPREHENSIVE METABOLIC PANEL      Component Value Range   Sodium 137  135 - 145 (mEq/L)   Potassium 3.7  3.5 - 5.1 (mEq/L)   Chloride 99  96 - 112 (mEq/L)   CO2 26  19 - 32 (mEq/L)   Glucose, Bld 108 (*) 70 - 99 (mg/dL)   BUN 17  6 - 23 (mg/dL)   Creatinine, Ser 8.29  0.50 - 1.10 (mg/dL)   Calcium 8.9  8.4 - 56.2 (mg/dL)   Total Protein 7.4  6.0 - 8.3 (g/dL)   Albumin 3.8  3.5 - 5.2 (g/dL)   AST 18  0 - 37 (U/L)   ALT 10  0 - 35 (U/L)   Alkaline Phosphatase 109  39 - 117 (U/L)   Total Bilirubin 0.2 (*) 0.3 - 1.2 (mg/dL)   GFR calc non Af Amer 53 (*) >90 (mL/min)   GFR calc Af Amer 62 (*) >90 (mL/min)  LIPASE, BLOOD      Component Value Range   Lipase 27  11 - 59 (U/L)  POCT I-STAT TROPONIN I      Component Value Range   Troponin i, poc 0.00  0.00 - 0.08 (ng/mL)   Comment 3            Dg Abd Acute W/chest  03/28/2011  *RADIOLOGY REPORT*  Clinical Data: Vomiting and abdominal pain  ACUTE ABDOMEN SERIES (ABDOMEN 2 VIEW & CHEST 1 VIEW)  Comparison: 09/01/2003  Findings: Heart size is normal.  There is no pleural effusion or pulmonary edema.  Stable scar within the lingular portion of the left lung.  No airspace consolidation.  No abnormally dilated loops of small bowel identified.  Colonic fluid levels are identified within the right colon.  Gas and stool is present within the colon up to the rectum.  IMPRESSION:  1.  Nonobstructive bowel gas pattern.  Original Report Authenticated By:  Rosealee Albee, M.D.       Patient reassessed, she does feel much better. She has mild persistent bilateral abdominal pain. She is in no acute distress and wants to go home. We are awaiting urinalysis, and would anticipate discharge home. Instructions given.   Urinalysis reviewed. These show large amount of hemoglobin, however, negative for any infection. Negative for ketones, negative for glucose. This is consistent with hematuria. Unclear etiology. Will need to followup up as an outpatient. May need further testing such as outpatient cystoscopy.  Leeanna Slaby A. Patrica Duel, MD 03/28/11 2330

## 2011-03-28 NOTE — ED Notes (Signed)
Pt comes by [facility] from home where she began having N/V/D since 1400.

## 2011-03-28 NOTE — Discharge Instructions (Signed)
Abdominal Pain Abdominal pain can be caused by many things. Your caregiver decides the seriousness of your pain by an examination and possibly blood tests and X-rays. Many cases can be observed and treated at home. Most abdominal pain is not caused by a disease and will probably improve without treatment. However, in many cases, more time must pass before a clear cause of the pain can be found. Before that point, it may not be known if you need more testing, or if hospitalization or surgery is needed. HOME CARE INSTRUCTIONS   Do not take laxatives unless directed by your caregiver.   Take pain medicine only as directed by your caregiver.   Only take over-the-counter or prescription medicines for pain, discomfort, or fever as directed by your caregiver.   Try a clear liquid diet (broth, tea, or water) for as long as directed by your caregiver. Slowly move to a bland diet as tolerated.  SEEK IMMEDIATE MEDICAL CARE IF:   The pain does not go away.   You have a fever.   You keep throwing up (vomiting).   The pain is felt only in portions of the abdomen. Pain in the right side could possibly be appendicitis. In an adult, pain in the left lower portion of the abdomen could be colitis or diverticulitis.   You pass bloody or black tarry stools.  MAKE SURE YOU:   Understand these instructions.   Will watch your condition.   Will get help right away if you are not doing well or get worse.  Document Released: 10/07/2004 Document Revised: 12/17/2010 Document Reviewed: 08/16/2007 [facility] Patient Information 2012 [place], [place].     Hematuria, Adult Hematuria (blood in your urine) can be caused by a bladder infection (cystitis), kidney infection (pyelonephritis), prostate infection (prostatitis), or kidney stone. Infections will usually respond to antibiotics (medications which kill germs), and a kidney stone will usually pass through your urine without further treatment. If you were put on  antibiotics, take all the medicine until gone. You may feel better in a few days, but take all of your medicine or the infection may not respond and become more difficult to treat. If antibiotics were not given, an infection did not cause the blood in the urine. A further work up to find out the reason may be needed. HOME CARE INSTRUCTIONS   Drink lots of fluid, 3 to 4 quarts a day. If you have been diagnosed with an infection, cranberry juice is especially recommended, in addition to large amounts of water.   Avoid caffeine, tea, and carbonated beverages, because they tend to irritate the bladder.   Avoid alcohol as it may irritate the prostate.   Only take over-the-counter or prescription medicines for pain, discomfort, or fever as directed by your caregiver.   If you have been diagnosed with a kidney stone follow your caregivers instructions regarding straining your urine to catch the stone.  TO PREVENT FURTHER INFECTIONS:  Empty the bladder often. Avoid holding urine for long periods of time.   After a bowel movement, women should cleanse front to back. Use each tissue only once.   Empty the bladder before and after sexual intercourse if you are a female.   Return to your caregiver if you develop back pain, fever, nausea (feeling sick to your stomach), vomiting, or your symptoms (problems) are not better in 3 days. Return sooner if you are getting worse.  If you have been requested to return for further testing make sure to keep your  appointments. If an infection is not the cause of blood in your urine, X-rays may be required. Your caregiver will discuss this with you. SEEK IMMEDIATE MEDICAL CARE IF:   You have a persistent fever over 102 F (38.9 C).   You develop severe vomiting and are unable to keep the medication down.   You develop severe back or abdominal pain despite taking your medications.   You begin passing a large amount of blood or clots in your urine.   You feel  extremely weak or faint, or pass out.  MAKE SURE YOU:   Understand these instructions.   Will watch your condition.   Will get help right away if you are not doing well or get worse.  Document Released: 12/28/2004 Document Revised: 12/17/2010 Document Reviewed: 08/17/2007 [facility] Patient Information 2012 [place], [place].    Nausea and Vomiting Nausea is a sick feeling that often comes before throwing up (vomiting). Vomiting is a reflex where stomach contents come out of your mouth. Vomiting can cause severe loss of body fluids (dehydration). Children and elderly adults can become dehydrated quickly, especially if they also have diarrhea. Nausea and vomiting are symptoms of a condition or disease. It is important to find the cause of your symptoms. CAUSES   Direct irritation of the stomach lining. This irritation can result from increased acid production (gastroesophageal reflux disease), infection, food poisoning, taking certain medicines (such as nonsteroidal anti-inflammatory drugs), alcohol use, or tobacco use.   Signals from the brain.These signals could be caused by a headache, heat exposure, an inner ear disturbance, increased pressure in the brain from injury, infection, a tumor, or a concussion, pain, emotional stimulus, or metabolic problems.   An obstruction in the gastrointestinal tract (bowel obstruction).   Illnesses such as diabetes, hepatitis, gallbladder problems, appendicitis, kidney problems, cancer, sepsis, atypical symptoms of a heart attack, or eating disorders.   Medical treatments such as chemotherapy and radiation.   Receiving medicine that makes you sleep (general anesthetic) during surgery.  DIAGNOSIS Your caregiver may ask for tests to be done if the problems do not improve after a few days. Tests may also be done if symptoms are severe or if the reason for the nausea and vomiting is not clear. Tests may include:  Urine tests.   Blood tests.   Stool  tests.   Cultures (to look for evidence of infection).   X-rays or other imaging studies.  Test results can help your caregiver make decisions about treatment or the need for additional tests. TREATMENT You need to stay well hydrated. Drink frequently but in small amounts.You may wish to drink water, sports drinks, clear broth, or eat frozen ice pops or gelatin dessert to help stay hydrated.When you eat, eating slowly may help prevent nausea.There are also some antinausea medicines that may help prevent nausea. HOME CARE INSTRUCTIONS   Take all medicine as directed by your caregiver.   If you do not have an appetite, do not force yourself to eat. However, you must continue to drink fluids.   If you have an appetite, eat a normal diet unless your caregiver tells you differently.   Eat a variety of complex carbohydrates (rice, wheat, potatoes, bread), lean meats, yogurt, fruits, and vegetables.   Avoid high-fat foods because they are more difficult to digest.   Drink enough water and fluids to keep your urine clear or pale yellow.   If you are dehydrated, ask your caregiver for specific rehydration instructions. Signs of dehydration may include:  Severe thirst.   Dry lips and mouth.   Dizziness.   Dark urine.   Decreasing urine frequency and amount.   Confusion.   Rapid breathing or pulse.  SEEK IMMEDIATE MEDICAL CARE IF:   You have blood or brown flecks (like coffee grounds) in your vomit.   You have black or bloody stools.   You have a severe headache or stiff neck.   You are confused.   You have severe abdominal pain.   You have chest pain or trouble breathing.   You do not urinate at least once every 8 hours.   You develop cold or clammy skin.   You continue to vomit for longer than 24 to 48 hours.   You have a fever.  MAKE SURE YOU:   Understand these instructions.   Will watch your condition.   Will get help right away if you are not doing well  or get worse.  Document Released: 12/28/2004 Document Revised: 12/17/2010 Document Reviewed: 05/27/2010 [facility] Patient Information 2012 [place], [place].

## 2011-03-28 NOTE — ED Notes (Signed)
Pt sts she ate lunch this afternoon at home and at 3pm she began having N/V/D. Patient sts that she is having an extreme amount of sharp pain in her lower abdomen bilaterally.

## 2011-03-28 NOTE — ED Notes (Signed)
Patient given discharge instructions, information, prescriptions, and diet order. Patient states that they adequately understand discharge information given and to return to ED if symptoms return or worsen.     

## 2011-03-29 ENCOUNTER — Telehealth: Payer: Self-pay | Admitting: Internal Medicine

## 2011-03-29 NOTE — Telephone Encounter (Signed)
I did not get confirmation. Please get a confirmation note from Dr. Mayford Knife. Thereafter, hold Plavix 5 days prior to EGD with dilation in the [facility]. Thanks.

## 2011-03-29 NOTE — Telephone Encounter (Signed)
Called and spoke with Dr. Norris Cross nurse and she states she is faxing over the OV note from Dr. Mayford Knife that addresses the pt holding her plavix.

## 2011-03-29 NOTE — Telephone Encounter (Signed)
Letter sent to Dr. Mayford Knife regarding Plavix.

## 2011-03-29 NOTE — Telephone Encounter (Signed)
Pt states she saw Dr. Mayford Knife on Friday and Dr. Mayford Knife was to let Dr. Marina Goodell  Know that she can stop her plavix for an egd with dil. Pt would like to schedule the procedure. Dr. Marina Goodell did you receive notification from Dr. Mayford Knife regarding this? Please advise.

## 2011-03-31 NOTE — Telephone Encounter (Signed)
Thank you, I have reviewed Dr. Norris Cross note

## 2011-03-31 NOTE — Telephone Encounter (Signed)
Received OV note from Dr. Mayford Knife stating it is ok for pt to hold Plavix for 5 days prior to EGD with dil. Pt scheduled for previsit 05/11/11@11am , EGD with dil scheduled for 05/26/11@8am . Pt aware of appt dates and times.

## 2011-05-01 ENCOUNTER — Ambulatory Visit (INDEPENDENT_AMBULATORY_CARE_PROVIDER_SITE_OTHER): Payer: BC Managed Care – PPO | Admitting: Emergency Medicine

## 2011-05-01 ENCOUNTER — Ambulatory Visit: Payer: BC Managed Care – PPO

## 2011-05-01 VITALS — BP 133/82 | HR 73 | Temp 98.2°F | Resp 16 | Ht 59.0 in | Wt 150.0 lb

## 2011-05-01 DIAGNOSIS — M549 Dorsalgia, unspecified: Secondary | ICD-10-CM

## 2011-05-01 DIAGNOSIS — D649 Anemia, unspecified: Secondary | ICD-10-CM

## 2011-05-01 LAB — POCT CBC
Granulocyte percent: 58.3 %G (ref 37–80)
HCT, POC: 36.1 % — AB (ref 37.7–47.9)
MID (cbc): 0.5 (ref 0–0.9)
MPV: 9.2 fL (ref 0–99.8)
POC Granulocyte: 3.3 (ref 2–6.9)
POC LYMPH PERCENT: 32.4 %L (ref 10–50)
Platelet Count, POC: 247 10*3/uL (ref 142–424)
RDW, POC: 13.5 %

## 2011-05-01 LAB — BASIC METABOLIC PANEL
Potassium: 4.2 mEq/L (ref 3.5–5.3)
Sodium: 142 mEq/L (ref 135–145)

## 2011-05-01 MED ORDER — PREDNISONE 10 MG PO TABS: ORAL_TABLET | ORAL | Status: DC

## 2011-05-01 NOTE — Progress Notes (Signed)
  Subjective:    Patient ID: [name], female    DOB: Apr 22, 1945, 66 y.o.   MRN: [id number]  HPI patient enters with severe pain in her right lower back with pain down into her right leg. She denies any recent trauma to her back. The pain starts in her lower lumbar spine and goes down the back of her right thigh. She tries to keep her leg straight to avoid the discomfort. She has a history of low back problems. She is under the care of Dr. Ethelene Hal. She frequently has steroid injections. She tried to get in and see Dr. Ethelene Hal but he is out of town and she was unable to get an appointment.    Review of Systems she has a history of seizure disorder and anemia. She also has a history of cardiac disease and hypertension. She also has a history depression. She has no known drug allergies but is on multiple medications.     Objective:   Physical Exam scrotal exam reveals an alert cooperative patient who does appear very pale. There is fluid accumulation beneath both eyes. She is tender at L5-S1 on the right. She does have deep tendon reflexes in the knees and ankles which are symmetrical. I could not review the MRI in that it was done at [facility] orthopedics unto the cone system revealed an L5-S1 disc disease and 3 mm of spondylolisthesis L4-5.  [facility] reading (PRIMARY) by  Dr.Kailana Benninger is an L4-L5 spondylolisthesis. Please comment on the left upper quadrant density. If this is calcification around the spleen or in the bowel.       Assessment & Plan:  Patient presenting with worsening low back pain and paresthesias down the right leg consistent with sciatica. We'll check a CBC in that she seems pale to me. Further treatment after I see what her CBC shows. She did go to the urologist this week and they told her she did not have a kidney stone causing her back pain.

## 2011-05-07 ENCOUNTER — Telehealth: Payer: Self-pay | Admitting: Internal Medicine

## 2011-05-07 NOTE — Telephone Encounter (Signed)
Left message for patient to call back  

## 2011-05-10 NOTE — Telephone Encounter (Signed)
I spoke with the patient's husband.  She is not going to get any injections.  She is scheduled for EGD 05/20/11.  She will keep this appt

## 2011-05-11 ENCOUNTER — Ambulatory Visit (AMBULATORY_SURGERY_CENTER): Payer: BC Managed Care – PPO | Admitting: *Deleted

## 2011-05-11 DIAGNOSIS — K222 Esophageal obstruction: Secondary | ICD-10-CM

## 2011-05-11 DIAGNOSIS — R1310 Dysphagia, unspecified: Secondary | ICD-10-CM

## 2011-05-11 MED ORDER — PEG-KCL-NACL-NASULF-NA ASC-C 100 G PO SOLR: ORAL | Status: DC

## 2011-05-20 ENCOUNTER — Ambulatory Visit (AMBULATORY_SURGERY_CENTER): Payer: BC Managed Care – PPO | Admitting: Internal Medicine

## 2011-05-20 ENCOUNTER — Encounter: Payer: Self-pay | Admitting: Internal Medicine

## 2011-05-20 VITALS — BP 176/89 | HR 68 | Temp 97.7°F | Resp 18 | Ht 59.0 in | Wt 150.0 lb

## 2011-05-20 DIAGNOSIS — R1310 Dysphagia, unspecified: Secondary | ICD-10-CM

## 2011-05-20 DIAGNOSIS — K219 Gastro-esophageal reflux disease without esophagitis: Secondary | ICD-10-CM

## 2011-05-20 DIAGNOSIS — K222 Esophageal obstruction: Secondary | ICD-10-CM

## 2011-05-20 HISTORY — PX: ESOPHAGOGASTRODUODENOSCOPY (EGD) WITH ESOPHAGEAL DILATION: SHX5812

## 2011-05-20 MED ORDER — SODIUM CHLORIDE 0.9 % IV SOLN: 500.0000 mL | INTRAVENOUS | Status: DC

## 2011-05-20 NOTE — Op Note (Signed)
[facility] [address] [place], [place]  [address]  ENDOSCOPY PROCEDURE REPORT  PATIENT:  [name], [name]  MR#:  [id number] BIRTHDATE:  [birth year], 65 yrs. old  GENDER:  female  ENDOSCOPIST:  Wilhemina Bonito. Eda Keys, MD Referred by:  Office  PROCEDURE DATE:  05/20/2011 PROCEDURE:  EGD, diagnostic 43235, Maloney Dilation of Esophagus - 38F ASA CLASS:  Class III INDICATIONS:  dysphagia, dilation of esophageal stricture  (Last dilation 04-2009)  MEDICATIONS:   MAC sedation, administered by CRNA, propofol (Diprivan) 100 mg IV TOPICAL ANESTHETIC:  none  DESCRIPTION OF PROCEDURE:   After the risks benefits and alternatives of the procedure were thoroughly explained, informed consent was obtained.  The LB GIF-H180 [id number] endoscope was introduced through the mouth and advanced to the second portion of the duodenum, without limitations.  The instrument was slowly withdrawn as the mucosa was fully examined. <<PROCEDUREIMAGES>>  A large caliber distal ring-like stricture was found in the distal esophagus.  There were multiple benign fundic gland type polyps identified in the fundus and body.  Otherwise the examination was normal to D2.    Retroflexed views revealed a hiatal hernia. The scope was then withdrawn from the patient and the procedure completed.  THERAPY: 38F MALONEY DILATOR PASSED W/O RESISTANCE OR HEME. TOLERATED WELL.  COMPLICATIONS:  None  ENDOSCOPIC IMPRESSION: 1) Stricture in the distal esophagus - s/p dilation 2) Polyps, multiple in the fundus / body 3) Otherwise normal examination 4) A hiatal hernia  RECOMMENDATIONS: 1) Clear liquids until 4 pm, then soft foods rest of day. Resume prior diet tomorrow. 2) Resume Plavix today  ______________________________ Wilhemina Bonito. Eda Keys, MD  CC:  Merri Brunette, MD;  The Patient  n. eSIGNED:   Wilhemina Bonito. Eda Keys at 05/20/2011 01:53 PM  Lady Saucier, [phone]

## 2011-05-20 NOTE — Progress Notes (Signed)
Patient did not experience any of the following events: a burn prior to discharge; a fall within the facility; wrong site/side/patient/procedure/implant event; or a hospital transfer or hospital admission upon discharge from the facility. (G8907) Patient did not have preoperative order for IV antibiotic SSI prophylaxis. (G8918)  

## 2011-05-20 NOTE — Patient Instructions (Signed)

## 2011-05-21 ENCOUNTER — Telehealth: Payer: Self-pay

## 2011-05-21 NOTE — Telephone Encounter (Signed)
Left message on answering machine. 

## 2011-05-26 ENCOUNTER — Encounter: Payer: BC Managed Care – PPO | Admitting: Internal Medicine

## 2011-05-31 ENCOUNTER — Encounter (HOSPITAL_COMMUNITY)
Admission: RE | Admit: 2011-05-31 | Discharge: 2011-05-31 | Disposition: A | Discharge status: Home / Self Care | Payer: Self-pay | Source: Ambulatory Visit | Attending: Cardiology | Admitting: Cardiology

## 2011-05-31 DIAGNOSIS — I2510 Atherosclerotic heart disease of native coronary artery without angina pectoris: Secondary | ICD-10-CM | POA: Insufficient documentation

## 2011-05-31 DIAGNOSIS — I252 Old myocardial infarction: Secondary | ICD-10-CM | POA: Insufficient documentation

## 2011-05-31 DIAGNOSIS — Z5189 Encounter for other specified aftercare: Secondary | ICD-10-CM | POA: Insufficient documentation

## 2011-05-31 DIAGNOSIS — K219 Gastro-esophageal reflux disease without esophagitis: Secondary | ICD-10-CM | POA: Insufficient documentation

## 2011-05-31 DIAGNOSIS — I10 Essential (primary) hypertension: Secondary | ICD-10-CM | POA: Insufficient documentation

## 2011-05-31 DIAGNOSIS — Z9861 Coronary angioplasty status: Secondary | ICD-10-CM | POA: Insufficient documentation

## 2011-05-31 DIAGNOSIS — I498 Other specified cardiac arrhythmias: Secondary | ICD-10-CM | POA: Insufficient documentation

## 2011-05-31 DIAGNOSIS — E039 Hypothyroidism, unspecified: Secondary | ICD-10-CM | POA: Insufficient documentation

## 2011-05-31 NOTE — Progress Notes (Signed)
Patient oriented to the Cardiac Rehab Maintenance Program and tolerated well. Pt c/o right sciatic nerve pain after walking the track for the first station, which she rated an 8/10 on the pain scale. Per pt no pain on the rower or the recumbent stepper.

## 2011-06-02 ENCOUNTER — Encounter (HOSPITAL_COMMUNITY): Admission: RE | Admit: 2011-06-02 | Payer: Self-pay | Source: Ambulatory Visit

## 2011-06-04 ENCOUNTER — Encounter (HOSPITAL_COMMUNITY): Payer: Self-pay

## 2011-06-09 ENCOUNTER — Encounter (HOSPITAL_COMMUNITY): Payer: Self-pay

## 2011-06-11 ENCOUNTER — Encounter (HOSPITAL_COMMUNITY): Payer: Self-pay

## 2011-06-14 ENCOUNTER — Encounter (HOSPITAL_COMMUNITY)
Admission: RE | Admit: 2011-06-14 | Discharge: 2011-06-14 | Disposition: A | Discharge status: Home / Self Care | Payer: Self-pay | Source: Ambulatory Visit | Attending: Cardiology | Admitting: Cardiology

## 2011-06-14 DIAGNOSIS — I2510 Atherosclerotic heart disease of native coronary artery without angina pectoris: Secondary | ICD-10-CM | POA: Insufficient documentation

## 2011-06-14 DIAGNOSIS — K219 Gastro-esophageal reflux disease without esophagitis: Secondary | ICD-10-CM | POA: Insufficient documentation

## 2011-06-14 DIAGNOSIS — I10 Essential (primary) hypertension: Secondary | ICD-10-CM | POA: Insufficient documentation

## 2011-06-14 DIAGNOSIS — Z9861 Coronary angioplasty status: Secondary | ICD-10-CM | POA: Insufficient documentation

## 2011-06-14 DIAGNOSIS — Z5189 Encounter for other specified aftercare: Secondary | ICD-10-CM | POA: Insufficient documentation

## 2011-06-14 DIAGNOSIS — E039 Hypothyroidism, unspecified: Secondary | ICD-10-CM | POA: Insufficient documentation

## 2011-06-14 DIAGNOSIS — I252 Old myocardial infarction: Secondary | ICD-10-CM | POA: Insufficient documentation

## 2011-06-14 DIAGNOSIS — I498 Other specified cardiac arrhythmias: Secondary | ICD-10-CM | POA: Insufficient documentation

## 2011-06-16 ENCOUNTER — Encounter (HOSPITAL_COMMUNITY): Payer: Self-pay

## 2011-06-18 ENCOUNTER — Encounter (HOSPITAL_COMMUNITY): Payer: Self-pay

## 2011-06-21 ENCOUNTER — Encounter (HOSPITAL_COMMUNITY): Payer: Self-pay

## 2011-06-23 ENCOUNTER — Encounter (HOSPITAL_COMMUNITY): Payer: Self-pay

## 2011-06-25 ENCOUNTER — Encounter (HOSPITAL_COMMUNITY): Payer: Self-pay

## 2011-06-28 ENCOUNTER — Encounter (HOSPITAL_COMMUNITY): Payer: Self-pay

## 2011-06-30 ENCOUNTER — Encounter (HOSPITAL_COMMUNITY): Payer: Self-pay

## 2011-07-02 ENCOUNTER — Encounter (HOSPITAL_COMMUNITY): Payer: Self-pay

## 2011-07-05 ENCOUNTER — Encounter (HOSPITAL_COMMUNITY): Payer: Self-pay

## 2011-07-07 ENCOUNTER — Telehealth (HOSPITAL_COMMUNITY): Payer: Self-pay | Admitting: *Deleted

## 2011-07-07 ENCOUNTER — Encounter (HOSPITAL_COMMUNITY): Payer: Self-pay

## 2011-07-09 ENCOUNTER — Encounter (HOSPITAL_COMMUNITY): Payer: Self-pay

## 2011-07-12 ENCOUNTER — Encounter (HOSPITAL_COMMUNITY): Payer: BC Managed Care – PPO

## 2011-07-14 ENCOUNTER — Encounter (HOSPITAL_COMMUNITY): Payer: BC Managed Care – PPO

## 2011-07-16 ENCOUNTER — Encounter (HOSPITAL_COMMUNITY): Payer: BC Managed Care – PPO

## 2011-07-19 ENCOUNTER — Encounter (HOSPITAL_COMMUNITY): Payer: BC Managed Care – PPO

## 2011-07-21 ENCOUNTER — Encounter (HOSPITAL_COMMUNITY): Payer: BC Managed Care – PPO

## 2011-07-23 ENCOUNTER — Encounter (HOSPITAL_COMMUNITY): Payer: BC Managed Care – PPO

## 2011-07-26 ENCOUNTER — Encounter (HOSPITAL_COMMUNITY): Payer: BC Managed Care – PPO

## 2011-07-28 ENCOUNTER — Encounter (HOSPITAL_COMMUNITY): Payer: BC Managed Care – PPO

## 2011-07-30 ENCOUNTER — Encounter (HOSPITAL_BASED_OUTPATIENT_CLINIC_OR_DEPARTMENT_OTHER): Payer: Self-pay | Admitting: *Deleted

## 2011-07-30 ENCOUNTER — Emergency Department (HOSPITAL_BASED_OUTPATIENT_CLINIC_OR_DEPARTMENT_OTHER)
Admission: EM | Admit: 2011-07-30 | Discharge: 2011-07-31 | Disposition: A | Discharge status: Home / Self Care | Payer: BC Managed Care – PPO | Attending: Emergency Medicine | Admitting: Emergency Medicine

## 2011-07-30 ENCOUNTER — Encounter (HOSPITAL_COMMUNITY): Payer: BC Managed Care – PPO

## 2011-07-30 DIAGNOSIS — M79609 Pain in unspecified limb: Secondary | ICD-10-CM | POA: Insufficient documentation

## 2011-07-30 DIAGNOSIS — K589 Irritable bowel syndrome without diarrhea: Secondary | ICD-10-CM | POA: Insufficient documentation

## 2011-07-30 DIAGNOSIS — E785 Hyperlipidemia, unspecified: Secondary | ICD-10-CM | POA: Insufficient documentation

## 2011-07-30 DIAGNOSIS — Z87891 Personal history of nicotine dependence: Secondary | ICD-10-CM | POA: Insufficient documentation

## 2011-07-30 DIAGNOSIS — I2510 Atherosclerotic heart disease of native coronary artery without angina pectoris: Secondary | ICD-10-CM | POA: Insufficient documentation

## 2011-07-30 DIAGNOSIS — I10 Essential (primary) hypertension: Secondary | ICD-10-CM | POA: Insufficient documentation

## 2011-07-30 DIAGNOSIS — F411 Generalized anxiety disorder: Secondary | ICD-10-CM | POA: Insufficient documentation

## 2011-07-30 DIAGNOSIS — M129 Arthropathy, unspecified: Secondary | ICD-10-CM | POA: Insufficient documentation

## 2011-07-30 DIAGNOSIS — M62830 Muscle spasm of back: Secondary | ICD-10-CM

## 2011-07-30 DIAGNOSIS — M542 Cervicalgia: Secondary | ICD-10-CM | POA: Diagnosis not present

## 2011-07-30 DIAGNOSIS — K219 Gastro-esophageal reflux disease without esophagitis: Secondary | ICD-10-CM | POA: Insufficient documentation

## 2011-07-30 DIAGNOSIS — M5380 Other specified dorsopathies, site unspecified: Secondary | ICD-10-CM | POA: Diagnosis not present

## 2011-07-30 DIAGNOSIS — Z9861 Coronary angioplasty status: Secondary | ICD-10-CM | POA: Insufficient documentation

## 2011-07-30 DIAGNOSIS — M546 Pain in thoracic spine: Secondary | ICD-10-CM | POA: Insufficient documentation

## 2011-07-30 DIAGNOSIS — I252 Old myocardial infarction: Secondary | ICD-10-CM | POA: Insufficient documentation

## 2011-07-30 MED ORDER — HYDROCODONE-ACETAMINOPHEN 5-325 MG PO TABS
1.0000 | ORAL_TABLET | Freq: Once | ORAL | Status: AC
  Administered 2011-07-30: 1 via ORAL
  Filled 2011-07-30: qty 1

## 2011-07-30 MED ORDER — MORPHINE SULFATE 4 MG/ML IJ SOLN
6.0000 mg | Freq: Once | INTRAMUSCULAR | Status: AC
  Administered 2011-07-30: 6 mg via INTRAMUSCULAR
  Filled 2011-07-30: qty 2

## 2011-07-30 NOTE — ED Notes (Signed)
Neck pain and right arm pain x 4 days. Was seen by her MD yesterday and was diagnosed with muscle strain and given muscle relaxants. Worse today. Looks sick.

## 2011-07-30 NOTE — ED Notes (Signed)
Pt reports having severe sharp shooting pain that started in her neck muscles and has progressed down her back, pt has history of degenerative disk disease. Followed by dr. Ethelene Hal with [facility]

## 2011-07-31 NOTE — ED Provider Notes (Signed)
History     CSN: [id number]  Arrival date & time 07/30/11  2137   First MD Initiated Contact with Patient 07/30/11 2257      Chief Complaint  Patient presents with  . Neck Pain    (Consider location/radiation/quality/duration/timing/severity/associated sxs/prior treatment) HPI Comments: Patient presents complaining of neck pain and upper back pain that radiates down the patient's right arm.  This started on Monday.  Patient had no injury or trauma.  She's not had similar pain before.  She did see her doctor yesterday who believed that she had a muscle strain and spasm and prescribed to Robaxin.  Patient already has Vicodin at home for prior low back disc herniation problems.  Patient denies any new weakness or numbness in her right upper arm.  She does state that she has right-sided numbness and weakness from childhood and does not believe it is changed from her baseline.  She has no fevers, chills or weight loss or night sweats.  No chest pain or shortness of breath.  No cough.  Patient's pain is worse with movement.  She comes in tonight because the pain was worse despite taking the Robaxin her physician prescribed her.  She did not take any Vicodin yesterday or today until tonight at 8:30 when she took one tablet.  Patient is a 66 y.o. female presenting with neck pain. The history is provided by the patient and the spouse.  Neck Pain  This is a new problem. The current episode started more than 2 days ago. The problem occurs constantly. The problem has been gradually worsening. Pertinent negatives include no chest pain and no headaches.    Past Medical History  Diagnosis Date  . Irritable bowel syndrome   . Esophageal reflux   . Dysphagia, unspecified   . Hypertension   . Depression   . Hypothyroidism   . Seizures   . Iron deficiency anemia   . Internal hemorrhoids   . Coronary artery disease   . Anxiety   . Arthritis   . Blood transfusion 1960  . Heart murmur   .  Hyperlipidemia   . Myocardial infarction 11/2009    1 stent placed    Past Surgical History  Procedure Date  . Breast surgery 2010     benign cysts  . Coronary angioplasty with stent placement 2011 and 2012    drug-eluting stent w/ ion stents to the distal right coronary artery    Family History  Problem Relation Age of Onset  . Breast cancer Sister   . Breast cancer      niece  . Heart disease Father   . Colon cancer Neg Hx   . Heart attack Mother     History  Substance Use Topics  . Smoking status: Former Games developer  . Smokeless tobacco: Never Used  . Alcohol Use: No    OB History    Grav Para Term Preterm Abortions TAB SAB Ect Mult Living                  Review of Systems  Constitutional: Negative.  Negative for fever and chills.  HENT: Positive for neck pain.   Eyes: Negative.   Respiratory: Negative.  Negative for cough and shortness of breath.   Cardiovascular: Negative.  Negative for chest pain.  Gastrointestinal: Negative.  Negative for nausea, vomiting, abdominal pain and diarrhea.  Genitourinary: Negative.   Musculoskeletal: Positive for back pain.  Skin: Negative.  Negative for color change and rash.  Neurological:  Negative.  Negative for syncope and headaches.  Hematological: Negative.  Negative for adenopathy.  Psychiatric/Behavioral: Negative.  Negative for confusion.  All other systems reviewed and are negative.    Allergies  Review of patient's allergies indicates no known allergies.  Home Medications   Current Outpatient Rx  Name Route Sig Dispense Refill  . ALPRAZOLAM 0.25 MG PO TABS Oral Take 0.25 mg by mouth at bedtime as needed. For anxiety.    . ASPIRIN 81 MG PO TBEC Oral Take 1 tablet (81 mg total) by mouth daily.    Marland Kitchen CALCITRIOL 0.5 MCG PO CAPS Oral Take 0.5 mcg by mouth daily.     Marland Kitchen CALCIUM-VITAMIN D 600-125 MG-UNIT PO TABS Oral Take 1 tablet by mouth daily.      Marland Kitchen CLOPIDOGREL BISULFATE 75 MG PO TABS Oral Take 75 mg by mouth daily.      Marland Kitchen DILTIAZEM HCL ER COATED BEADS 240 MG PO TB24 Oral Take 240 mg by mouth daily.      Marland Kitchen DOXAZOSIN MESYLATE 2 MG PO TABS Oral Take 1 mg by mouth at bedtime.    Marland Kitchen HYDROCODONE-ACETAMINOPHEN 5-500 MG PO TABS Oral Take 1 tablet by mouth every 6 (six) hours as needed. For pain    . ISOSORBIDE MONONITRATE ER 30 MG PO TB24 Oral Take 1 tablet (30 mg total) by mouth daily. 30 tablet 11  . KLOR-CON M20 20 MEQ PO TBCR Oral Take 1 tablet by mouth Twice daily.    Marland Kitchen LEVETIRACETAM 500 MG PO TABS Oral Take 500 mg by mouth every 12 (twelve) hours. Anti-seizure medication.  Consult needed.    Marland Kitchen LEVOTHYROXINE SODIUM 100 MCG PO TABS Oral Take 100 mcg by mouth daily.      Marland Kitchen LOSARTAN POTASSIUM 100 MG PO TABS Oral Take 1 tablet by mouth Daily.    Marland Kitchen METHOCARBAMOL 500 MG PO TABS Oral Take 500 mg by mouth 4 (four) times daily.    Marland Kitchen METOPROLOL SUCCINATE ER 25 MG PO TB24 Oral Take 1 tablet (25 mg total) by mouth daily. 30 tablet 12  . PANTOPRAZOLE SODIUM 40 MG PO TBEC Oral Take 1 tablet (40 mg total) by mouth 2 (two) times daily. 60 tablet 11  . PAROXETINE HCL 30 MG PO TABS Oral Take 30 mg by mouth every morning.    Marland Kitchen ROSUVASTATIN CALCIUM 10 MG PO TABS Oral Take 10 mg by mouth daily.      Marland Kitchen VITAMIN E 400 UNITS PO CAPS Oral Take 400 Units by mouth daily.     Marland Kitchen NITROGLYCERIN 0.4 MG SL SUBL Sublingual Place 0.4 mg under the tongue every 5 (five) minutes as needed.        BP 118/71  Pulse 81  Temp 98.8 F (37.1 C) (Oral)  Resp 18  SpO2 96%  Physical Exam  Nursing note and vitals reviewed. Constitutional: She is oriented to person, place, and time. She appears well-developed and well-nourished.  Non-toxic appearance. She does not have a sickly appearance.  HENT:  Head: Normocephalic and atraumatic.  Eyes: Conjunctivae, EOM and lids are normal. Pupils are equal, round, and reactive to light. No scleral icterus.  Neck: Trachea normal and normal range of motion. Neck supple.  Cardiovascular: Normal rate, regular  rhythm and normal heart sounds.   Pulmonary/Chest: Effort normal and breath sounds normal. No respiratory distress. She has no wheezes. She has no rales.  Abdominal: Soft. Normal appearance. There is no tenderness. There is no rebound, no guarding and no CVA tenderness.  Musculoskeletal: Normal range of motion.       No focal C-spine tenderness on examination.  No upper thoracic spine tenderness on examination.  Patient did have tenderness over her right upper trapezius.  She did have some increased pain with axial loading of her spine.  She has a palpable radial pulse in her right arm.  Capillary refill less than 2 seconds in her fingertips.  She has slight weakness on the right arm compared with the left but the patient states this is baseline.  She has some decreased sensation in that right arm but she again states this is baseline.  Neurological: She is alert and oriented to person, place, and time. She has normal strength.  Skin: Skin is warm, dry and intact. No rash noted.  Psychiatric: She has a normal mood and affect. Her behavior is normal. Judgment and thought content normal.    ED Course  Procedures (including critical care time)  Labs Reviewed - No data to display No results found.   No diagnosis found.    MDM  Patient with neck pain radiating down her arm that could be possible cervical radiculopathy.  Patient has no acute new neurologic deficit.  Per her and her husband she has some baseline right arm decreased sensation and weakness which they believe is at her baseline at this time.  She has no signs of vascular deficiencies she has a palpable radial pulse and capillary refill that's normal.  She has no history for trauma or focal C-spine tenderness to suspect osteomyelitis or epidural abscess.  Patient also has no other infectious symptoms.  Patient did have focal tenderness over her upper back muscles which also could mean that her symptoms are primarily muscle strain and  spasm.  I've advised the patient that she should followup with her primary care physician next week if she's not improved as she may need an MRI to further evaluate her cervical spine.  She did receive morphine and Vicodin here and does feel improved and feels comfortable going home.  We did discuss that she should use her Vicodin at home for her pain if she needs it in addition to Robaxin her primary care physician already prescribed for her.        Nat Christen, MD 07/31/11 [phone]

## 2011-08-02 ENCOUNTER — Encounter (HOSPITAL_COMMUNITY): Payer: BC Managed Care – PPO

## 2011-08-04 ENCOUNTER — Encounter (HOSPITAL_COMMUNITY): Payer: BC Managed Care – PPO

## 2011-08-06 ENCOUNTER — Encounter (HOSPITAL_COMMUNITY): Payer: BC Managed Care – PPO

## 2011-08-09 ENCOUNTER — Encounter (HOSPITAL_COMMUNITY): Payer: BC Managed Care – PPO

## 2011-08-11 ENCOUNTER — Encounter (HOSPITAL_COMMUNITY): Payer: BC Managed Care – PPO

## 2011-08-13 ENCOUNTER — Encounter (HOSPITAL_COMMUNITY): Payer: BC Managed Care – PPO

## 2011-08-16 ENCOUNTER — Encounter (HOSPITAL_COMMUNITY): Payer: BC Managed Care – PPO

## 2011-08-18 ENCOUNTER — Encounter (HOSPITAL_COMMUNITY): Payer: BC Managed Care – PPO

## 2011-08-20 ENCOUNTER — Encounter (HOSPITAL_COMMUNITY): Payer: BC Managed Care – PPO

## 2011-08-23 ENCOUNTER — Encounter (HOSPITAL_COMMUNITY): Payer: BC Managed Care – PPO

## 2011-08-25 ENCOUNTER — Encounter (HOSPITAL_COMMUNITY): Payer: BC Managed Care – PPO

## 2011-08-27 ENCOUNTER — Encounter (HOSPITAL_COMMUNITY): Payer: BC Managed Care – PPO

## 2011-08-30 ENCOUNTER — Encounter (HOSPITAL_COMMUNITY): Payer: BC Managed Care – PPO

## 2011-09-01 ENCOUNTER — Encounter (HOSPITAL_COMMUNITY): Payer: BC Managed Care – PPO

## 2011-09-03 ENCOUNTER — Encounter (HOSPITAL_COMMUNITY): Payer: BC Managed Care – PPO

## 2011-09-06 ENCOUNTER — Encounter (HOSPITAL_COMMUNITY): Payer: BC Managed Care – PPO

## 2011-09-08 ENCOUNTER — Encounter (HOSPITAL_COMMUNITY): Payer: BC Managed Care – PPO

## 2011-09-10 ENCOUNTER — Encounter (HOSPITAL_COMMUNITY): Payer: BC Managed Care – PPO

## 2011-09-15 ENCOUNTER — Encounter (HOSPITAL_COMMUNITY): Payer: BC Managed Care – PPO

## 2011-09-17 ENCOUNTER — Encounter (HOSPITAL_COMMUNITY): Payer: BC Managed Care – PPO

## 2011-09-20 ENCOUNTER — Encounter (HOSPITAL_COMMUNITY): Payer: BC Managed Care – PPO

## 2011-09-22 ENCOUNTER — Encounter (HOSPITAL_COMMUNITY): Payer: BC Managed Care – PPO

## 2011-09-24 ENCOUNTER — Encounter (HOSPITAL_COMMUNITY): Payer: BC Managed Care – PPO

## 2011-09-27 ENCOUNTER — Encounter (HOSPITAL_COMMUNITY): Payer: BC Managed Care – PPO

## 2011-09-29 ENCOUNTER — Encounter (HOSPITAL_COMMUNITY): Payer: BC Managed Care – PPO

## 2011-10-01 ENCOUNTER — Encounter (HOSPITAL_COMMUNITY): Payer: BC Managed Care – PPO

## 2011-10-04 ENCOUNTER — Encounter (HOSPITAL_COMMUNITY): Payer: BC Managed Care – PPO

## 2011-10-06 ENCOUNTER — Encounter (HOSPITAL_COMMUNITY): Payer: BC Managed Care – PPO

## 2011-10-08 ENCOUNTER — Encounter (HOSPITAL_COMMUNITY): Payer: BC Managed Care – PPO

## 2011-10-11 ENCOUNTER — Encounter (HOSPITAL_COMMUNITY): Payer: BC Managed Care – PPO

## 2011-10-13 ENCOUNTER — Encounter (HOSPITAL_COMMUNITY): Payer: BC Managed Care – PPO

## 2011-10-15 ENCOUNTER — Encounter (HOSPITAL_COMMUNITY): Payer: BC Managed Care – PPO

## 2011-10-18 ENCOUNTER — Encounter (HOSPITAL_COMMUNITY): Payer: BC Managed Care – PPO

## 2011-10-20 ENCOUNTER — Encounter (HOSPITAL_COMMUNITY): Payer: BC Managed Care – PPO

## 2011-10-22 ENCOUNTER — Encounter (HOSPITAL_COMMUNITY): Payer: BC Managed Care – PPO

## 2011-10-25 ENCOUNTER — Encounter (HOSPITAL_COMMUNITY): Payer: BC Managed Care – PPO

## 2011-10-27 ENCOUNTER — Encounter (HOSPITAL_COMMUNITY): Payer: BC Managed Care – PPO

## 2011-10-29 ENCOUNTER — Encounter (HOSPITAL_COMMUNITY): Payer: BC Managed Care – PPO

## 2011-11-01 ENCOUNTER — Encounter (HOSPITAL_COMMUNITY): Payer: BC Managed Care – PPO

## 2011-11-03 ENCOUNTER — Encounter (HOSPITAL_COMMUNITY): Payer: BC Managed Care – PPO

## 2011-11-04 ENCOUNTER — Telehealth: Payer: Self-pay | Admitting: Internal Medicine

## 2011-11-04 NOTE — Telephone Encounter (Signed)
Changed pharmacy in chart per patient

## 2011-11-05 ENCOUNTER — Encounter (HOSPITAL_COMMUNITY): Payer: BC Managed Care – PPO

## 2011-11-08 ENCOUNTER — Encounter (HOSPITAL_COMMUNITY): Payer: BC Managed Care – PPO

## 2011-11-08 DIAGNOSIS — J329 Chronic sinusitis, unspecified: Secondary | ICD-10-CM | POA: Diagnosis not present

## 2011-11-10 ENCOUNTER — Encounter (HOSPITAL_COMMUNITY): Payer: BC Managed Care – PPO

## 2011-11-12 ENCOUNTER — Encounter (HOSPITAL_COMMUNITY): Payer: BC Managed Care – PPO

## 2011-11-13 ENCOUNTER — Emergency Department (HOSPITAL_BASED_OUTPATIENT_CLINIC_OR_DEPARTMENT_OTHER): Payer: Medicare Other

## 2011-11-13 ENCOUNTER — Encounter (HOSPITAL_BASED_OUTPATIENT_CLINIC_OR_DEPARTMENT_OTHER): Payer: Self-pay

## 2011-11-13 ENCOUNTER — Emergency Department (HOSPITAL_BASED_OUTPATIENT_CLINIC_OR_DEPARTMENT_OTHER)
Admission: EM | Admit: 2011-11-13 | Discharge: 2011-11-13 | Disposition: A | Discharge status: Home / Self Care | Payer: Medicare Other | Attending: Emergency Medicine | Admitting: Emergency Medicine

## 2011-11-13 DIAGNOSIS — K219 Gastro-esophageal reflux disease without esophagitis: Secondary | ICD-10-CM | POA: Diagnosis not present

## 2011-11-13 DIAGNOSIS — I252 Old myocardial infarction: Secondary | ICD-10-CM | POA: Insufficient documentation

## 2011-11-13 DIAGNOSIS — R1310 Dysphagia, unspecified: Secondary | ICD-10-CM | POA: Insufficient documentation

## 2011-11-13 DIAGNOSIS — F411 Generalized anxiety disorder: Secondary | ICD-10-CM | POA: Insufficient documentation

## 2011-11-13 DIAGNOSIS — I658 Occlusion and stenosis of other precerebral arteries: Secondary | ICD-10-CM | POA: Diagnosis not present

## 2011-11-13 DIAGNOSIS — M542 Cervicalgia: Secondary | ICD-10-CM

## 2011-11-13 DIAGNOSIS — K589 Irritable bowel syndrome without diarrhea: Secondary | ICD-10-CM | POA: Diagnosis not present

## 2011-11-13 DIAGNOSIS — I10 Essential (primary) hypertension: Secondary | ICD-10-CM | POA: Insufficient documentation

## 2011-11-13 DIAGNOSIS — Z87891 Personal history of nicotine dependence: Secondary | ICD-10-CM | POA: Insufficient documentation

## 2011-11-13 DIAGNOSIS — E785 Hyperlipidemia, unspecified: Secondary | ICD-10-CM | POA: Diagnosis not present

## 2011-11-13 DIAGNOSIS — M5412 Radiculopathy, cervical region: Secondary | ICD-10-CM | POA: Insufficient documentation

## 2011-11-13 DIAGNOSIS — M436 Torticollis: Secondary | ICD-10-CM | POA: Diagnosis not present

## 2011-11-13 DIAGNOSIS — F329 Major depressive disorder, single episode, unspecified: Secondary | ICD-10-CM | POA: Insufficient documentation

## 2011-11-13 DIAGNOSIS — Z79899 Other long term (current) drug therapy: Secondary | ICD-10-CM | POA: Diagnosis not present

## 2011-11-13 DIAGNOSIS — D649 Anemia, unspecified: Secondary | ICD-10-CM | POA: Diagnosis not present

## 2011-11-13 DIAGNOSIS — F3289 Other specified depressive episodes: Secondary | ICD-10-CM | POA: Insufficient documentation

## 2011-11-13 DIAGNOSIS — R51 Headache: Secondary | ICD-10-CM | POA: Diagnosis not present

## 2011-11-13 DIAGNOSIS — E039 Hypothyroidism, unspecified: Secondary | ICD-10-CM | POA: Insufficient documentation

## 2011-11-13 DIAGNOSIS — G40802 Other epilepsy, not intractable, without status epilepticus: Secondary | ICD-10-CM | POA: Insufficient documentation

## 2011-11-13 LAB — CBC WITH DIFFERENTIAL/PLATELET
Eosinophils Relative: 1 % (ref 0–5)
HCT: 34.6 % — ABNORMAL LOW (ref 36.0–46.0)
Lymphocytes Relative: 20 % (ref 12–46)
Lymphs Abs: 1.8 10*3/uL (ref 0.7–4.0)
MCH: 29.5 pg (ref 26.0–34.0)
MCV: 85.6 fL (ref 78.0–100.0)
Monocytes Absolute: 0.8 10*3/uL (ref 0.1–1.0)
RBC: 4.04 MIL/uL (ref 3.87–5.11)
WBC: 9 10*3/uL (ref 4.0–10.5)

## 2011-11-13 LAB — BASIC METABOLIC PANEL
BUN: 12 mg/dL (ref 6–23)
CO2: 25 mEq/L (ref 19–32)
Calcium: 9.2 mg/dL (ref 8.4–10.5)
Creatinine, Ser: 1 mg/dL (ref 0.50–1.10)
Glucose, Bld: 137 mg/dL — ABNORMAL HIGH (ref 70–99)

## 2011-11-13 MED ORDER — KETOROLAC TROMETHAMINE 30 MG/ML IJ SOLN
30.0000 mg | Freq: Once | INTRAMUSCULAR | Status: AC
  Administered 2011-11-13: 30 mg via INTRAVENOUS
  Filled 2011-11-13: qty 1

## 2011-11-13 MED ORDER — MORPHINE SULFATE 4 MG/ML IJ SOLN
4.0000 mg | Freq: Once | INTRAMUSCULAR | Status: AC
  Administered 2011-11-13: 4 mg via INTRAVENOUS
  Filled 2011-11-13: qty 1

## 2011-11-13 MED ORDER — HYDROMORPHONE HCL PF 1 MG/ML IJ SOLN
0.5000 mg | Freq: Once | INTRAMUSCULAR | Status: AC
  Administered 2011-11-13: 0.5 mg via INTRAVENOUS
  Filled 2011-11-13: qty 1

## 2011-11-13 MED ORDER — SODIUM CHLORIDE 0.9 % IV SOLN
Freq: Once | INTRAVENOUS | Status: AC
  Administered 2011-11-13: 13:00:00 via INTRAVENOUS

## 2011-11-13 MED ORDER — OXYCODONE-ACETAMINOPHEN 5-325 MG PO TABS: 2.0000 | ORAL_TABLET | ORAL | Status: DC | PRN

## 2011-11-13 MED ORDER — IOHEXOL 350 MG/ML SOLN
100.0000 mL | Freq: Once | INTRAVENOUS | Status: AC | PRN
  Administered 2011-11-13: 100 mL via INTRAVENOUS

## 2011-11-13 MED ORDER — METHOCARBAMOL 500 MG PO TABS: 500.0000 mg | ORAL_TABLET | Freq: Two times a day (BID) | ORAL | Status: DC

## 2011-11-13 MED ORDER — DIAZEPAM 5 MG/ML IJ SOLN
2.5000 mg | Freq: Once | INTRAMUSCULAR | Status: AC
  Administered 2011-11-13: 2.5 mg via INTRAVENOUS
  Filled 2011-11-13: qty 2

## 2011-11-13 MED ORDER — DIAZEPAM 5 MG/ML IJ SOLN
INTRAMUSCULAR | Status: AC
  Administered 2011-11-13: 2.5 mg
  Filled 2011-11-13: qty 2

## 2011-11-13 NOTE — ED Notes (Signed)
Patient transported to CT via stretcher.

## 2011-11-13 NOTE — ED Notes (Signed)
Pt reprts left sided neck pain described as severe.  Pain radiates down left arm and she reports tingling. Pain has been present x 3 weeks but worsening today. Denies injury. She has decreased ROM of neck.

## 2011-11-13 NOTE — ED Provider Notes (Signed)
History     CSN: [id number]  Arrival date & time 11/13/11  1044   First MD Initiated Contact with Patient 11/13/11 1213      Chief Complaint  Patient presents with  . Neck Pain    (Consider location/radiation/quality/duration/timing/severity/associated sxs/prior treatment) Patient is a 66 y.o. female presenting with neck injury. The history is provided by the patient. No language interpreter was used.  Neck Injury This is a new problem. The current episode started 1 to 4 weeks ago. The problem occurs constantly. The problem has been rapidly worsening. Associated symptoms include neck pain. Nothing aggravates the symptoms. She has tried nothing for the symptoms.  Pt complains of severe pain to the left side of her neck and the left head.  Pt denies any injuries.  Pt reports she started having pain 3 weeks ago.  Pt reports pain is severe today.  Pt denies any history of neck problems.  Past Medical History  Diagnosis Date  . Irritable bowel syndrome   . Esophageal reflux   . Dysphagia, unspecified   . Hypertension   . Depression   . Hypothyroidism   . Seizures   . Iron deficiency anemia   . Internal hemorrhoids   . Coronary artery disease   . Anxiety   . Arthritis   . Blood transfusion 1960  . Heart murmur   . Hyperlipidemia   . Myocardial infarction 11/2009    1 stent placed    Past Surgical History  Procedure Date  . Breast surgery 2010     benign cysts  . Coronary angioplasty with stent placement 2011 and 2012    drug-eluting stent w/ ion stents to the distal right coronary artery    Family History  Problem Relation Age of Onset  . Breast cancer Sister   . Breast cancer      niece  . Heart disease Father   . Colon cancer Neg Hx   . Heart attack Mother     History  Substance Use Topics  . Smoking status: Former Games developer  . Smokeless tobacco: Never Used  . Alcohol Use: No    OB History    Grav Para Term Preterm Abortions TAB SAB Ect Mult Living            Review of Systems  HENT: Positive for neck pain and neck stiffness.   All other systems reviewed and are negative.    Allergies  Review of patient's allergies indicates no known allergies.  Home Medications   Current Outpatient Rx  Name Route Sig Dispense Refill  . ALPRAZOLAM 0.25 MG PO TABS Oral Take 0.25 mg by mouth at bedtime as needed. For anxiety.    . ASPIRIN 81 MG PO TBEC Oral Take 1 tablet (81 mg total) by mouth daily.    Marland Kitchen CALCITRIOL 0.5 MCG PO CAPS Oral Take 0.5 mcg by mouth daily.     Marland Kitchen CALCIUM-VITAMIN D 600-125 MG-UNIT PO TABS Oral Take 1 tablet by mouth daily.      Marland Kitchen CLOPIDOGREL BISULFATE 75 MG PO TABS Oral Take 75 mg by mouth daily.     Marland Kitchen DILTIAZEM HCL ER COATED BEADS 240 MG PO TB24 Oral Take 240 mg by mouth daily.      Marland Kitchen DOXAZOSIN MESYLATE 2 MG PO TABS Oral Take 1 mg by mouth at bedtime.    Marland Kitchen HYDROCODONE-ACETAMINOPHEN 5-500 MG PO TABS Oral Take 1 tablet by mouth every 6 (six) hours as needed. For pain    . ISOSORBIDE  MONONITRATE ER 30 MG PO TB24 Oral Take 1 tablet (30 mg total) by mouth daily. 30 tablet 11  . KLOR-CON M20 20 MEQ PO TBCR Oral Take 1 tablet by mouth Twice daily.    Marland Kitchen LEVETIRACETAM 500 MG PO TABS Oral Take 500 mg by mouth every 12 (twelve) hours. Anti-seizure medication.  Consult needed.    Marland Kitchen LEVOTHYROXINE SODIUM 100 MCG PO TABS Oral Take 100 mcg by mouth daily.      Marland Kitchen LOSARTAN POTASSIUM 100 MG PO TABS Oral Take 1 tablet by mouth Daily.    Marland Kitchen METHOCARBAMOL 500 MG PO TABS Oral Take 500 mg by mouth 4 (four) times daily.    Marland Kitchen METOPROLOL SUCCINATE ER 25 MG PO TB24 Oral Take 1 tablet (25 mg total) by mouth daily. 30 tablet 12  . NITROGLYCERIN 0.4 MG SL SUBL Sublingual Place 0.4 mg under the tongue every 5 (five) minutes as needed.      Marland Kitchen PANTOPRAZOLE SODIUM 40 MG PO TBEC Oral Take 1 tablet (40 mg total) by mouth 2 (two) times daily. 60 tablet 11  . PAROXETINE HCL 30 MG PO TABS Oral Take 30 mg by mouth every morning.    Marland Kitchen ROSUVASTATIN CALCIUM 10 MG PO  TABS Oral Take 10 mg by mouth daily.      Marland Kitchen VITAMIN E 400 UNITS PO CAPS Oral Take 400 Units by mouth daily.       BP 148/108  Pulse 94  Temp 98.2 F (36.8 C) (Oral)  Resp 18  Ht 4\' 11"  (1.499 m)  Wt 143 lb (64.864 kg)  BMI 28.88 kg/m2  SpO2 100%  Physical Exam  Nursing note and vitals reviewed. Constitutional: She is oriented to person, place, and time. She appears well-developed and well-nourished.  HENT:  Head: Normocephalic.  Right Ear: External ear normal.  Left Ear: External ear normal.  Eyes: Conjunctivae normal and EOM are normal. Pupils are equal, round, and reactive to light.  Neck: Normal range of motion. Neck supple.  Cardiovascular: Normal rate, regular rhythm and normal heart sounds.   Pulmonary/Chest: Effort normal and breath sounds normal.  Abdominal: Soft.  Musculoskeletal: Normal range of motion.  Neurological: She is alert and oriented to person, place, and time. She has normal reflexes.  Skin: Skin is warm.  Psychiatric: She has a normal mood and affect.    ED Course  Procedures (including critical care time)  Labs Reviewed  CBC WITH DIFFERENTIAL - Abnormal; Notable for the following:    Hemoglobin 11.9 (*)     HCT 34.6 (*)     All other components within normal limits  BASIC METABOLIC PANEL   No results found.   No diagnosis found.   Date: 11/13/2011  Rate: 92  Rhythm: normal sinus rhythm  QRS Axis: left  Intervals: normal  ST/T Wave abnormalities: nonspecific ST changes  Conduction Disutrbances:none  Narrative Interpretation:   Old EKG Reviewed: unchanged   MDM    Pt given iv morphine with no relief, Pt given dilaudid and torodol and pt reports some pain relief.     Ct head and Ct angio neck  No acute abnormality,  Pt advised to follow up with her MD on Monday.  Pt given rx for percocet and robaxin.          Lonia Skinner [place], [place] 11/13/11 1659  Lonia Skinner [place], [place] 11/13/11 2245

## 2011-11-13 NOTE — ED Notes (Signed)
Family standing at door stating they have been in his room 35 minutes wanting to know delay. Explain to family we are full and we only have one doctor at this time. Asked if there was anything I could do at this time for comfort.

## 2011-11-13 NOTE — ED Notes (Signed)
Pt reports neck pain x 3 weeks worsening today.  Pain radiates to head and she is unable to move head to the left.

## 2011-11-13 NOTE — ED Notes (Signed)
Patient transported to CT 

## 2011-11-14 NOTE — ED Provider Notes (Signed)
Medical screening examination/treatment/procedure(s) were performed by non-physician practitioner and as supervising physician I was immediately available for consultation/collaboration.  Martha K Linker, MD 11/14/11 0707 

## 2011-11-15 ENCOUNTER — Encounter (HOSPITAL_COMMUNITY): Payer: BC Managed Care – PPO

## 2011-11-16 ENCOUNTER — Observation Stay (HOSPITAL_COMMUNITY)
Admission: EM | Admit: 2011-11-16 | Discharge: 2011-11-18 | Disposition: A | Discharge status: Home / Self Care | Payer: Medicare Other | Attending: Internal Medicine | Admitting: Internal Medicine

## 2011-11-16 ENCOUNTER — Encounter (HOSPITAL_COMMUNITY): Payer: Self-pay | Admitting: *Deleted

## 2011-11-16 ENCOUNTER — Emergency Department (HOSPITAL_COMMUNITY): Payer: Medicare Other

## 2011-11-16 DIAGNOSIS — R079 Chest pain, unspecified: Secondary | ICD-10-CM | POA: Diagnosis not present

## 2011-11-16 DIAGNOSIS — F411 Generalized anxiety disorder: Secondary | ICD-10-CM | POA: Diagnosis not present

## 2011-11-16 DIAGNOSIS — E785 Hyperlipidemia, unspecified: Secondary | ICD-10-CM | POA: Diagnosis not present

## 2011-11-16 DIAGNOSIS — N189 Chronic kidney disease, unspecified: Secondary | ICD-10-CM | POA: Insufficient documentation

## 2011-11-16 DIAGNOSIS — I2510 Atherosclerotic heart disease of native coronary artery without angina pectoris: Secondary | ICD-10-CM | POA: Diagnosis not present

## 2011-11-16 DIAGNOSIS — N179 Acute kidney failure, unspecified: Secondary | ICD-10-CM | POA: Diagnosis not present

## 2011-11-16 DIAGNOSIS — F3289 Other specified depressive episodes: Secondary | ICD-10-CM | POA: Insufficient documentation

## 2011-11-16 DIAGNOSIS — E039 Hypothyroidism, unspecified: Secondary | ICD-10-CM

## 2011-11-16 DIAGNOSIS — I252 Old myocardial infarction: Secondary | ICD-10-CM | POA: Diagnosis not present

## 2011-11-16 DIAGNOSIS — K589 Irritable bowel syndrome without diarrhea: Secondary | ICD-10-CM

## 2011-11-16 DIAGNOSIS — Z8669 Personal history of other diseases of the nervous system and sense organs: Secondary | ICD-10-CM | POA: Diagnosis not present

## 2011-11-16 DIAGNOSIS — I471 Supraventricular tachycardia, unspecified: Secondary | ICD-10-CM

## 2011-11-16 DIAGNOSIS — I129 Hypertensive chronic kidney disease with stage 1 through stage 4 chronic kidney disease, or unspecified chronic kidney disease: Secondary | ICD-10-CM | POA: Diagnosis not present

## 2011-11-16 DIAGNOSIS — Z955 Presence of coronary angioplasty implant and graft: Secondary | ICD-10-CM

## 2011-11-16 DIAGNOSIS — Z87891 Personal history of nicotine dependence: Secondary | ICD-10-CM | POA: Diagnosis not present

## 2011-11-16 DIAGNOSIS — G5600 Carpal tunnel syndrome, unspecified upper limb: Secondary | ICD-10-CM

## 2011-11-16 DIAGNOSIS — K219 Gastro-esophageal reflux disease without esophagitis: Secondary | ICD-10-CM

## 2011-11-16 DIAGNOSIS — G40909 Epilepsy, unspecified, not intractable, without status epilepticus: Secondary | ICD-10-CM | POA: Diagnosis not present

## 2011-11-16 DIAGNOSIS — R51 Headache: Secondary | ICD-10-CM

## 2011-11-16 DIAGNOSIS — Z79899 Other long term (current) drug therapy: Secondary | ICD-10-CM | POA: Diagnosis not present

## 2011-11-16 DIAGNOSIS — F32A Depression, unspecified: Secondary | ICD-10-CM

## 2011-11-16 DIAGNOSIS — D649 Anemia, unspecified: Secondary | ICD-10-CM

## 2011-11-16 DIAGNOSIS — M47812 Spondylosis without myelopathy or radiculopathy, cervical region: Secondary | ICD-10-CM

## 2011-11-16 DIAGNOSIS — F329 Major depressive disorder, single episode, unspecified: Secondary | ICD-10-CM | POA: Insufficient documentation

## 2011-11-16 DIAGNOSIS — I10 Essential (primary) hypertension: Secondary | ICD-10-CM | POA: Diagnosis present

## 2011-11-16 LAB — CBC
MCH: 29.1 pg (ref 26.0–34.0)
MCHC: 34.7 g/dL (ref 30.0–36.0)
MCV: 83.9 fL (ref 78.0–100.0)
Platelets: 312 10*3/uL (ref 150–400)
RDW: 12.7 % (ref 11.5–15.5)

## 2011-11-16 LAB — COMPREHENSIVE METABOLIC PANEL
AST: 21 U/L (ref 0–37)
Albumin: 3.4 g/dL — ABNORMAL LOW (ref 3.5–5.2)
CO2: 26 mEq/L (ref 19–32)
Calcium: 9.3 mg/dL (ref 8.4–10.5)
Creatinine, Ser: 0.95 mg/dL (ref 0.50–1.10)
GFR calc non Af Amer: 62 mL/min — ABNORMAL LOW (ref >90)

## 2011-11-16 MED ORDER — METOPROLOL SUCCINATE ER 25 MG PO TB24
25.0000 mg | ORAL_TABLET | Freq: Every day | ORAL | Status: DC
  Administered 2011-11-17 – 2011-11-18 (×2): 25 mg via ORAL
  Filled 2011-11-16 (×2): qty 1

## 2011-11-16 MED ORDER — ACETAMINOPHEN 325 MG PO TABS
650.0000 mg | ORAL_TABLET | Freq: Four times a day (QID) | ORAL | Status: DC | PRN
  Administered 2011-11-18: 650 mg via ORAL
  Filled 2011-11-16: qty 2

## 2011-11-16 MED ORDER — LOSARTAN POTASSIUM 50 MG PO TABS
100.0000 mg | ORAL_TABLET | Freq: Every day | ORAL | Status: DC
  Administered 2011-11-17 – 2011-11-18 (×2): 100 mg via ORAL
  Filled 2011-11-16 (×2): qty 2

## 2011-11-16 MED ORDER — CALCIUM-VITAMIN D 600-125 MG-UNIT PO TABS: 1.0000 | ORAL_TABLET | Freq: Every day | ORAL | Status: DC

## 2011-11-16 MED ORDER — VITAMIN E 180 MG (400 UNIT) PO CAPS
400.0000 [IU] | ORAL_CAPSULE | Freq: Every day | ORAL | Status: DC
  Administered 2011-11-17 – 2011-11-18 (×2): 400 [IU] via ORAL
  Filled 2011-11-16 (×2): qty 1

## 2011-11-16 MED ORDER — METHOCARBAMOL 500 MG PO TABS
500.0000 mg | ORAL_TABLET | Freq: Four times a day (QID) | ORAL | Status: DC
  Administered 2011-11-16 – 2011-11-17 (×3): 500 mg via ORAL
  Filled 2011-11-16 (×6): qty 1

## 2011-11-16 MED ORDER — CALCIUM CARBONATE-VITAMIN D 500-200 MG-UNIT PO TABS
1.0000 | ORAL_TABLET | Freq: Every day | ORAL | Status: DC
  Administered 2011-11-17 – 2011-11-18 (×2): 1 via ORAL
  Filled 2011-11-16 (×2): qty 1

## 2011-11-16 MED ORDER — ONDANSETRON HCL 4 MG/2ML IJ SOLN: 4.0000 mg | Freq: Four times a day (QID) | INTRAMUSCULAR | Status: DC | PRN

## 2011-11-16 MED ORDER — ALPRAZOLAM 0.25 MG PO TABS
0.2500 mg | ORAL_TABLET | Freq: Every evening | ORAL | Status: DC | PRN
  Administered 2011-11-17: 0.25 mg via ORAL
  Filled 2011-11-16: qty 1

## 2011-11-16 MED ORDER — POTASSIUM CHLORIDE CRYS ER 20 MEQ PO TBCR
20.0000 meq | EXTENDED_RELEASE_TABLET | Freq: Two times a day (BID) | ORAL | Status: DC
  Administered 2011-11-16 – 2011-11-18 (×4): 20 meq via ORAL
  Filled 2011-11-16 (×5): qty 1

## 2011-11-16 MED ORDER — SODIUM CHLORIDE 0.9 % IJ SOLN: 3.0000 mL | Freq: Two times a day (BID) | INTRAMUSCULAR | Status: DC

## 2011-11-16 MED ORDER — PAROXETINE HCL 30 MG PO TABS
30.0000 mg | ORAL_TABLET | Freq: Every day | ORAL | Status: DC
  Administered 2011-11-17 – 2011-11-18 (×2): 30 mg via ORAL
  Filled 2011-11-16 (×2): qty 1

## 2011-11-16 MED ORDER — NITROGLYCERIN 0.4 MG SL SUBL: 0.4000 mg | SUBLINGUAL_TABLET | SUBLINGUAL | Status: DC | PRN

## 2011-11-16 MED ORDER — ONDANSETRON HCL 4 MG/2ML IJ SOLN
4.0000 mg | Freq: Once | INTRAMUSCULAR | Status: AC
  Administered 2011-11-16: 4 mg via INTRAVENOUS
  Filled 2011-11-16: qty 2

## 2011-11-16 MED ORDER — CALCITRIOL 0.5 MCG PO CAPS
0.5000 ug | ORAL_CAPSULE | Freq: Every day | ORAL | Status: DC
  Administered 2011-11-17 – 2011-11-18 (×2): 0.5 ug via ORAL
  Filled 2011-11-16 (×2): qty 1

## 2011-11-16 MED ORDER — OXYCODONE-ACETAMINOPHEN 5-325 MG PO TABS
2.0000 | ORAL_TABLET | ORAL | Status: DC | PRN
  Administered 2011-11-16 – 2011-11-17 (×2): 2 via ORAL
  Filled 2011-11-16 (×2): qty 2

## 2011-11-16 MED ORDER — ASPIRIN EC 81 MG PO TBEC
81.0000 mg | DELAYED_RELEASE_TABLET | Freq: Every day | ORAL | Status: DC
  Administered 2011-11-17 – 2011-11-18 (×2): 81 mg via ORAL
  Filled 2011-11-16 (×2): qty 1

## 2011-11-16 MED ORDER — DILTIAZEM HCL ER COATED BEADS 240 MG PO TB24
240.0000 mg | ORAL_TABLET | Freq: Every day | ORAL | Status: DC
  Administered 2011-11-17 – 2011-11-18 (×2): 240 mg via ORAL
  Filled 2011-11-16 (×2): qty 1

## 2011-11-16 MED ORDER — ONDANSETRON HCL 4 MG PO TABS: 4.0000 mg | ORAL_TABLET | Freq: Four times a day (QID) | ORAL | Status: DC | PRN

## 2011-11-16 MED ORDER — PANTOPRAZOLE SODIUM 40 MG PO TBEC
40.0000 mg | DELAYED_RELEASE_TABLET | Freq: Two times a day (BID) | ORAL | Status: DC
  Administered 2011-11-16 – 2011-11-18 (×4): 40 mg via ORAL
  Filled 2011-11-16 (×5): qty 1

## 2011-11-16 MED ORDER — LORAZEPAM 2 MG/ML IJ SOLN
1.0000 mg | Freq: Once | INTRAMUSCULAR | Status: AC
  Administered 2011-11-16: 1 mg via INTRAVENOUS
  Filled 2011-11-16: qty 1

## 2011-11-16 MED ORDER — HYDROMORPHONE HCL PF 1 MG/ML IJ SOLN
1.0000 mg | Freq: Once | INTRAMUSCULAR | Status: AC
  Administered 2011-11-16: 1 mg via INTRAVENOUS
  Filled 2011-11-16: qty 1

## 2011-11-16 MED ORDER — SODIUM CHLORIDE 0.9 % IV SOLN
INTRAVENOUS | Status: DC
  Administered 2011-11-16: 15:00:00 via INTRAVENOUS

## 2011-11-16 MED ORDER — LEVETIRACETAM 500 MG PO TABS
500.0000 mg | ORAL_TABLET | Freq: Two times a day (BID) | ORAL | Status: DC
  Administered 2011-11-16 – 2011-11-18 (×4): 500 mg via ORAL
  Filled 2011-11-16 (×5): qty 1

## 2011-11-16 MED ORDER — ISOSORBIDE MONONITRATE ER 30 MG PO TB24
30.0000 mg | ORAL_TABLET | Freq: Every day | ORAL | Status: DC
  Administered 2011-11-17 – 2011-11-18 (×2): 30 mg via ORAL
  Filled 2011-11-16 (×2): qty 1

## 2011-11-16 MED ORDER — CLOPIDOGREL BISULFATE 75 MG PO TABS
75.0000 mg | ORAL_TABLET | Freq: Every day | ORAL | Status: DC
  Administered 2011-11-17 – 2011-11-18 (×2): 75 mg via ORAL
  Filled 2011-11-16 (×2): qty 1

## 2011-11-16 MED ORDER — LEVOTHYROXINE SODIUM 100 MCG PO TABS
100.0000 ug | ORAL_TABLET | Freq: Every day | ORAL | Status: DC
  Administered 2011-11-17 – 2011-11-18 (×2): 100 ug via ORAL
  Filled 2011-11-16 (×4): qty 1

## 2011-11-16 MED ORDER — MORPHINE SULFATE 2 MG/ML IJ SOLN
1.0000 mg | INTRAMUSCULAR | Status: DC | PRN
  Administered 2011-11-16: 1 mg via INTRAVENOUS
  Filled 2011-11-16: qty 1

## 2011-11-16 MED ORDER — ATORVASTATIN CALCIUM 20 MG PO TABS
20.0000 mg | ORAL_TABLET | Freq: Every day | ORAL | Status: DC
  Administered 2011-11-17: 20 mg via ORAL
  Filled 2011-11-16 (×2): qty 1

## 2011-11-16 MED ORDER — DOXAZOSIN MESYLATE 1 MG PO TABS
1.0000 mg | ORAL_TABLET | Freq: Every day | ORAL | Status: DC
  Administered 2011-11-16 – 2011-11-17 (×2): 1 mg via ORAL
  Filled 2011-11-16 (×3): qty 1

## 2011-11-16 MED ORDER — MORPHINE SULFATE 4 MG/ML IJ SOLN
4.0000 mg | Freq: Once | INTRAMUSCULAR | Status: AC
  Administered 2011-11-16: 4 mg via INTRAVENOUS
  Filled 2011-11-16: qty 1

## 2011-11-16 MED ORDER — ACETAMINOPHEN 650 MG RE SUPP: 650.0000 mg | Freq: Four times a day (QID) | RECTAL | Status: DC | PRN

## 2011-11-16 NOTE — ED Provider Notes (Addendum)
History     CSN: [id number]  Arrival date & time 11/16/11  1323   First MD Initiated Contact with Patient 11/16/11 1344      Chief Complaint  Patient presents with  . Headache  . Chest Pain    (Consider location/radiation/quality/duration/timing/severity/associated sxs/prior treatment) Patient is a 66 y.o. female presenting with headaches and chest pain. The history is provided by the patient.  Headache  Pertinent negatives include no fever, no palpitations, no shortness of breath and no vomiting.  Chest Pain Pertinent negatives for primary symptoms include no fever, no shortness of breath, no cough, no palpitations, no abdominal pain and no vomiting.  Pertinent negatives for associated symptoms include no numbness and no weakness.   pt with hx cad, stents, presents after episode cp earlier today. At rest. Mid chest dull pain, was constant when present lasting several minutes ?relief w ntg, asa by ems. Pt unsure if same as prior cardiac cp. Pain not pleuritic. No cough or uri c/o. No fever or chills. No leg pain or swelling, no dvt or pe hx. Felt mild sob. Denies nv or diaphoresis. Also notes persistent headache for past 2 weeks, diffuse. Dull. Was mild at onset, slowly worse. Denies hx migraines. No recent head trauma, syncope or fall. States was seen at [facility] ED a few days ago w same, states had cts which were ok, but pain persists. No neck pain/stiffness. No eye pain or change in vision. No sinus drainage. No fevers. Denies numbness/weakness. Denies problems w balance or coordination. Hx seizures, on keppra, stae no seizures x years.   Past Medical History  Diagnosis Date  . Irritable bowel syndrome   . Esophageal reflux   . Dysphagia, unspecified   . Hypertension   . Depression   . Hypothyroidism   . Seizures   . Iron deficiency anemia   . Internal hemorrhoids   . Coronary artery disease   . Anxiety   . Arthritis   . Blood transfusion 1960  . Heart murmur   .  Hyperlipidemia   . Myocardial infarction 11/2009    1 stent placed    Past Surgical History  Procedure Date  . Breast surgery 2010     benign cysts  . Coronary angioplasty with stent placement 2011 and 2012    drug-eluting stent w/ ion stents to the distal right coronary artery    Family History  Problem Relation Age of Onset  . Breast cancer Sister   . Breast cancer      niece  . Heart disease Father   . Colon cancer Neg Hx   . Heart attack Mother     History  Substance Use Topics  . Smoking status: Former Games developer  . Smokeless tobacco: Never Used  . Alcohol Use: No    OB History    Grav Para Term Preterm Abortions TAB SAB Ect Mult Living                  Review of Systems  Constitutional: Negative for fever and chills.  HENT: Negative for neck pain and neck stiffness.   Eyes: Negative for pain, redness and visual disturbance.  Respiratory: Negative for cough and shortness of breath.   Cardiovascular: Positive for chest pain. Negative for palpitations and leg swelling.  Gastrointestinal: Negative for vomiting and abdominal pain.  Genitourinary: Negative for dysuria and flank pain.  Musculoskeletal: Negative for back pain.  Skin: Negative for rash.  Neurological: Positive for headaches. Negative for weakness and numbness.  Hematological: Does not bruise/bleed easily.  Psychiatric/Behavioral: Negative for confusion.    Allergies  Review of patient's allergies indicates no known allergies.  Home Medications   Current Outpatient Rx  Name  Route  Sig  Dispense  Refill  . ALPRAZOLAM 0.25 MG PO TABS   Oral   Take 0.25 mg by mouth at bedtime as needed. For anxiety.         . ASPIRIN 81 MG PO TBEC   Oral   Take 1 tablet (81 mg total) by mouth daily.         Marland Kitchen CALCITRIOL 0.5 MCG PO CAPS   Oral   Take 0.5 mcg by mouth daily.          Marland Kitchen CALCIUM-VITAMIN D 600-125 MG-UNIT PO TABS   Oral   Take 1 tablet by mouth daily.           Marland Kitchen CLOPIDOGREL  BISULFATE 75 MG PO TABS   Oral   Take 75 mg by mouth daily.          Marland Kitchen DILTIAZEM HCL ER COATED BEADS 240 MG PO TB24   Oral   Take 240 mg by mouth daily.         Marland Kitchen DOXAZOSIN MESYLATE 2 MG PO TABS   Oral   Take 1 mg by mouth at bedtime.         . ISOSORBIDE MONONITRATE ER 30 MG PO TB24   Oral   Take 1 tablet (30 mg total) by mouth daily.   30 tablet   11   . KLOR-CON M20 20 MEQ PO TBCR   Oral   Take 1 tablet by mouth Twice daily.         Marland Kitchen LEVETIRACETAM 500 MG PO TABS   Oral   Take 500 mg by mouth every 12 (twelve) hours. Anti-seizure medication.  Consult needed.         Marland Kitchen LEVOTHYROXINE SODIUM 100 MCG PO TABS   Oral   Take 100 mcg by mouth daily.           Marland Kitchen LOSARTAN POTASSIUM 100 MG PO TABS   Oral   Take 1 tablet by mouth Daily.         Marland Kitchen METHOCARBAMOL 500 MG PO TABS   Oral   Take 500 mg by mouth 4 (four) times daily.         Marland Kitchen METOPROLOL SUCCINATE ER 25 MG PO TB24   Oral   Take 1 tablet (25 mg total) by mouth daily.   30 tablet   12   . NITROGLYCERIN 0.4 MG SL SUBL   Sublingual   Place 0.4 mg under the tongue every 5 (five) minutes as needed.           . OXYCODONE-ACETAMINOPHEN 5-325 MG PO TABS   Oral   Take 2 tablets by mouth every 4 (four) hours as needed for pain.   16 tablet   0   . PANTOPRAZOLE SODIUM 40 MG PO TBEC   Oral   Take 1 tablet (40 mg total) by mouth 2 (two) times daily.   60 tablet   11   . PAROXETINE HCL 30 MG PO TABS   Oral   Take 30 mg by mouth every morning.         Marland Kitchen ROSUVASTATIN CALCIUM 10 MG PO TABS   Oral   Take 10 mg by mouth daily.           Marland Kitchen VITAMIN E 400  UNITS PO CAPS   Oral   Take 400 Units by mouth daily.          Marland Kitchen HYDROCODONE-ACETAMINOPHEN 5-500 MG PO TABS   Oral   Take 1 tablet by mouth every 6 (six) hours as needed. For pain           BP 133/99  Pulse 111  Temp 98 F (36.7 C) (Oral)  Resp 20  Ht 4\' 11"  (1.499 m)  Wt 143 lb (64.864 kg)  BMI 28.88 kg/m2  SpO2  99%  Physical Exam  Nursing note and vitals reviewed. Constitutional: She is oriented to person, place, and time. She appears well-developed and well-nourished. No distress.  HENT:  Head: Atraumatic.  Nose: Nose normal.  Mouth/Throat: Oropharynx is clear and moist.       No sinus or temporal tenderness.  Eyes: Conjunctivae normal and EOM are normal. Pupils are equal, round, and reactive to light. No scleral icterus.  Neck: Neck supple. No tracheal deviation present. No thyromegaly present.       No stiffness or rigidity.   Cardiovascular: Normal rate, regular rhythm, normal heart sounds and intact distal pulses.  Exam reveals no gallop and no friction rub.   No murmur heard. Pulmonary/Chest: Effort normal and breath sounds normal. No respiratory distress. She exhibits no tenderness.  Abdominal: Soft. Normal appearance and bowel sounds are normal. She exhibits no distension. There is no tenderness.  Genitourinary:       No cva tenderness.  Musculoskeletal: Normal range of motion. She exhibits no edema and no tenderness.  Neurological: She is alert and oriented to person, place, and time. No cranial nerve deficit.       Motor intact bilaterally.   Skin: Skin is warm and dry. No rash noted. She is not diaphoretic.  Psychiatric:       Pt very anxious.     ED Course  Procedures (including critical care time)   Labs Reviewed  CBC  COMPREHENSIVE METABOLIC PANEL  TROPONIN I   Results for orders placed during the hospital encounter of 11/16/11  CBC      Component Value Range   WBC 6.7  4.0 - 10.5 K/uL   RBC 4.36  3.87 - 5.11 MIL/uL   Hemoglobin 12.7  12.0 - 15.0 g/dL   HCT 40.9  81.1 - 91.4 %   MCV 83.9  78.0 - 100.0 fL   MCH 29.1  26.0 - 34.0 pg   MCHC 34.7  30.0 - 36.0 g/dL   RDW 78.2  95.6 - 21.3 %   Platelets 312  150 - 400 K/uL  COMPREHENSIVE METABOLIC PANEL      Component Value Range   Sodium 140  135 - 145 mEq/L   Potassium 3.6  3.5 - 5.1 mEq/L   Chloride 102  96 -  112 mEq/L   CO2 26  19 - 32 mEq/L   Glucose, Bld 123 (*) 70 - 99 mg/dL   BUN 8  6 - 23 mg/dL   Creatinine, Ser 0.86  0.50 - 1.10 mg/dL   Calcium 9.3  8.4 - 57.8 mg/dL   Total Protein 7.1  6.0 - 8.3 g/dL   Albumin 3.4 (*) 3.5 - 5.2 g/dL   AST 21  0 - 37 U/L   ALT 10  0 - 35 U/L   Alkaline Phosphatase 96  39 - 117 U/L   Total Bilirubin 0.3  0.3 - 1.2 mg/dL   GFR calc non Af Amer 62 (*) >  90 mL/min   GFR calc Af Amer 71 (*) >90 mL/min  TROPONIN I      Component Value Range   Troponin I <0.30  <0.30 ng/mL   Dg Chest 2 View  11/16/2011  *RADIOLOGY REPORT*  Clinical Data: Chest pain.  CHEST - 2 VIEW  Comparison: January 02, 2011.  Findings: Cardiomediastinal silhouette appears normal.  No acute pulmonary disease is noted.  Bony thorax is intact.  IMPRESSION: No acute cardiopulmonary abnormality seen.   Original Report Authenticated By: Lupita Raider.,  M.D.    Ct Head Wo Contrast  11/13/2011  *RADIOLOGY REPORT*  Clinical Data:  Neck pain and headache  CT HEAD WITHOUT CONTRAST CT CERVICAL SPINE WITHOUT CONTRAST  Technique:  Multidetector CT imaging of the head and cervical spine was performed following the standard protocol without intravenous contrast.  Multiplanar CT image reconstructions of the cervical spine were also generated.  Comparison:   06/21/2009  CT HEAD  Findings: Again noted is prominent CSF space on the left with atrophy or incomplete development of the superior left temporal lobe and posterior left frontal lobe.  Scattered subcortical white matter hypo attenuation is noted bilaterally.  This appears similar to the previous examination. There is no evidence for acute brain infarct, hemorrhage or mass. The paranasal sinuses and mastoid air cells are clear.  The skull is intact.  IMPRESSION:  1.  No acute intracranial abnormalities. 2.  Normal sulcation pattern involving the left temporal and posterior frontal lobe.  Unchanged from previous exam.  CT CERVICAL SPINE  Findings: Normal  alignment of the cervical spine.  The vertebral body heights are well preserved.  There is multilevel disc space narrowing and ventral endplate spurring.  IMPRESSION:  1.  No acute findings. 2.  Cervical spondylosis.   Original Report Authenticated By: Signa Kell, M.D.    Ct Angio Neck W/cm &/or Wo/cm  11/13/2011  *RADIOLOGY REPORT*  Clinical Data:  Severe neck pain and left arm pain for 3 weeks.  CT ANGIOGRAPHY NECK  Technique:  Multidetector CT imaging of the neck was performed using the standard protocol during bolus administration of intravenous contrast.  Multiplanar CT image reconstructions including MIPs were obtained to evaluate the vascular anatomy. Carotid stenosis measurements (when applicable) are obtained utilizing NASCET criteria, using the distal internal carotid diameter as the denominator.  Contrast: OMNIPAQUE IOHEXOL 350 MG/ML SOLN  Comparison:  CT cervical spine 11/13/2011.  Findings:  Conventional branching of the great vessels from the arch.  Minimal proximal atheromatous calcification affects the right greater than left subclavians.  Nonstenotic ostial atherosclerotic calcification affects the right vertebral but not the left.  Both are equal in size.  There is no vertebral stenosis or dissection.  Both common carotid arteries are patent through the neck.  There is nonstenotic atherosclerotic change at both carotid bifurcations which is primarily calcific.  No soft plaque or ulceration is seen. There is no cervical internal carotid artery dissection or fibromuscular dysplasia.  There is no neck mass identified.  The lung apices are clear.  There is no mediastinal mass.  The trachea is midline.  There is no lesion of the mandible or maxilla. Visualized intracranial compartment and paranasal sinuses are clear.  Left-sided neural foraminal narrowing due to uncinate spurring is present at C5-6 potentially affecting the left C6 nerve root. Correlate clinically for C6 radiculopathy.    Review of the MIP images confirms the above findings.  IMPRESSION: No evidence for carotid stenosis or craniocervical vascular dissection.  Cervical spondylosis with neural foraminal narrowing and disc space narrowing most pronounced at C5-6 on the left.  Correlate clinically for left C6 radiculopathy.   Original Report Authenticated By: Davonna Belling, M.D.    Ct Cervical Spine Wo Contrast  11/13/2011  *RADIOLOGY REPORT*  Clinical Data:  Neck pain and headache  CT HEAD WITHOUT CONTRAST CT CERVICAL SPINE WITHOUT CONTRAST  Technique:  Multidetector CT imaging of the head and cervical spine was performed following the standard protocol without intravenous contrast.  Multiplanar CT image reconstructions of the cervical spine were also generated.  Comparison:   06/21/2009  CT HEAD  Findings: Again noted is prominent CSF space on the left with atrophy or incomplete development of the superior left temporal lobe and posterior left frontal lobe.  Scattered subcortical white matter hypo attenuation is noted bilaterally.  This appears similar to the previous examination. There is no evidence for acute brain infarct, hemorrhage or mass. The paranasal sinuses and mastoid air cells are clear.  The skull is intact.  IMPRESSION:  1.  No acute intracranial abnormalities. 2.  Normal sulcation pattern involving the left temporal and posterior frontal lobe.  Unchanged from previous exam.  CT CERVICAL SPINE  Findings: Normal alignment of the cervical spine.  The vertebral body heights are well preserved.  There is multilevel disc space narrowing and ventral endplate spurring.  IMPRESSION:  1.  No acute findings. 2.  Cervical spondylosis.   Original Report Authenticated By: Signa Kell, M.D.        MDM  Iv ns. Labs. Monitor. Ecg.  Cxr.  Reviewed nursing notes and prior charts for additional history.   Recent head ct and ct angio neck for same symptoms neg for acute process. No fevers. No neck stiffness/rigidity on exam.  Headache gradual in onset, slowly worse.   Pt appears very anxious, shaky, reassured, ativan 1 mg iv. Morphine for pain.    Date: 11/16/2011  Rate: 108  Rhythm: sinus tachycardia  QRS Axis: left  Intervals: normal  ST/T Wave abnormalities: normal  Conduction Disutrbances:left anterior fascicular block  Narrative Interpretation:   Old EKG Reviewed: unchanged  Recheck feels improved.  Given hx cad, cp, will admit - [facility] hospitalist called to admit. Initial troponin negative.   [facility] states obs, team 1, tele.         Suzi Roots, MD 11/16/11 1609  Suzi Roots, MD 11/16/11 [id number]

## 2011-11-16 NOTE — ED Notes (Signed)
[id number]<NG> Expected date:<BR> Expected time:<BR> Means of arrival:Ambulance<BR> Comments:<BR> Chest pain/headache

## 2011-11-16 NOTE — H&P (Signed)
[facility] History and Physical  [name]:[id number] DOB: 18-Aug-1945 DOA: 11/16/2011  Referring physician: ER physician PCP: Allean Found, MD   Chief Complaint: chest pain  HPI:  66 year old female with past medically history significant for CAD status post history of stent placements in the right coronary artery and posterolateral artery and most recent (03/10/2011) cardiac cath with findings of  distal RCA 50-60% lesion with recommendation to add imdur, HTN, Dyslipidemia, Anxiety, GERD, Hypothyroidism, Seizure disorder who presented with complaints of sudden onset retrosternal chest pain, 6-7/10 in intensity, non radiating and relieved by nitroglycerin. Patient reported chest pain started at rest and there were no aggravating symptoms. Chest pain started 1 day prior to admission, lasting for few minutes and constant and dull in nature. No associated shortness of breath, no palpitations, no sweating. No abdominal pain, no nausea or vomiting. No reports of fever or chills, no cough. No reports of blood in stool or urine. No lightheadedness or dizziness or loss of consciousness.  Assessment and Plan:  Principal Problem: *Chest pain  Obvious concern is for ACS in this patient who has a history of CAD and stent placement in past  I have reviewed EKG and it shows few T wave inversions in anterior leads, sinus tachycardia but no ST elevations/depressions  The first set of cardiac enzymes is negative; follow up next 2 sets of cardiac enzymes  We will order 2 D ECHO for this admission(based on cardiac cath report in 02/2011 patient has had normal systolic function)  Continue aspirin and plavix  Active problems: Hypertension  Continue metoprolol, Cardizem, Imdur, losartan Dyslipidemia  Continue crestor Seizure disorder  Continue Keppra Hypothyroidism  Continue synthroid Anxiety and depression  Continue xanax and paxil  Code Status: Full Family  Communication: Pt at bedside Disposition Plan: Admit for further evaluation  Manson Passey, MD  [facility] Pager [phone]  If 7PM-7AM, please contact night-coverage [email or web address] Password [email or web address] 11/16/2011, 5:02 PM   Review of Systems:  Constitutional: Negative for fever, chills and malaise/fatigue. Negative for diaphoresis.  HENT: Negative for hearing loss, ear pain, nosebleeds, congestion, sore throat, neck pain, tinnitus and ear discharge.   Eyes: Negative for blurred vision, double vision, photophobia, pain, discharge and redness.  Respiratory: Negative for cough, hemoptysis, sputum production, shortness of breath, wheezing and stridor.   Cardiovascular: per hpi.  Gastrointestinal: Negative for nausea, vomiting and abdominal pain. Negative for heartburn, constipation, blood in stool and melena.  Genitourinary: Negative for dysuria, urgency, frequency, hematuria and flank pain.  Musculoskeletal: Negative for myalgias, back pain, joint pain and falls.  Skin: Negative for itching and rash.  Neurological: Negative for dizziness and weakness. Negative for tingling, tremors, sensory change, speech change, focal weakness, loss of consciousness and headaches.  Endo/Heme/Allergies: Negative for environmental allergies and polydipsia. Does not bruise/bleed easily.  Psychiatric/Behavioral: Negative for suicidal ideas. The patient is not nervous/anxious.      Past Medical History  Diagnosis Date  . Irritable bowel syndrome   . Esophageal reflux   . Dysphagia, unspecified   . Hypertension   . Depression   . Hypothyroidism   . Seizures   . Iron deficiency anemia   . Internal hemorrhoids   . Coronary artery disease   . Anxiety   . Arthritis   . Blood transfusion 1960  . Heart murmur   . Hyperlipidemia   . Myocardial infarction 11/2009    1 stent placed   Past Surgical History  Procedure Date  . Breast surgery 2010  benign cysts  . Coronary angioplasty with stent placement 2011 and  2012    drug-eluting stent w/ ion stents to the distal right coronary artery   Social History:  reports that she has quit smoking. She has never used smokeless tobacco. She reports that she does not drink alcohol or use illicit drugs.  No Known Allergies  Family History: heart disease in parents  Prior to Admission medications   Medication Sig Start Date End Date Taking? Authorizing Provider  ALPRAZolam (XANAX) 0.25 MG tablet Take 0.25 mg by mouth at bedtime as needed. For anxiety.   Yes Historical Provider, MD  aspirin EC 81 MG EC tablet Take 1 tablet (81 mg total) by mouth daily. 01/03/11 01/03/12 Yes Othella Boyer, MD  calcitRIOL (ROCALTROL) 0.5 MCG capsule Take 0.5 mcg by mouth daily.    Yes Historical Provider, MD  Calcium-Vitamin D 600-125 MG-UNIT TABS Take 1 tablet by mouth daily.     Yes Historical Provider, MD  clopidogrel (PLAVIX) 75 MG tablet Take 75 mg by mouth daily.    Yes Historical Provider, MD  diltiazem (MATZIM LA) 240 MG 24 hr tablet Take 240 mg by mouth daily.   Yes Historical Provider, MD  doxazosin (CARDURA) 2 MG tablet Take 1 mg by mouth at bedtime.   Yes Historical Provider, MD  isosorbide mononitrate (IMDUR) 30 MG 24 hr tablet Take 1 tablet (30 mg total) by mouth daily. 03/10/11 03/09/12 Yes Corky Crafts, MD  KLOR-CON M20 20 MEQ tablet Take 1 tablet by mouth Twice daily. 05/05/11  Yes Historical Provider, MD  levETIRAcetam (KEPPRA) 500 MG tablet Take 500 mg by mouth every 12 (twelve) hours. Anti-seizure medication.  Consult needed.   Yes Historical Provider, MD  levothyroxine (SYNTHROID, LEVOTHROID) 100 MCG tablet Take 100 mcg by mouth daily.     Yes Historical Provider, MD  losartan (COZAAR) 100 MG tablet Take 1 tablet by mouth Daily. 05/05/11  Yes Historical Provider, MD  methocarbamol (ROBAXIN) 500 MG tablet Take 500 mg by mouth 4 (four) times daily.   Yes Historical Provider, MD  metoprolol succinate (TOPROL-XL) 25 MG 24 hr tablet Take 1 tablet (25 mg  total) by mouth daily. 01/03/11  Yes Othella Boyer, MD  nitroGLYCERIN (NITROSTAT) 0.4 MG SL tablet Place 0.4 mg under the tongue every 5 (five) minutes as needed.     Yes Historical Provider, MD  oxyCODONE-acetaminophen (PERCOCET/ROXICET) 5-325 MG per tablet Take 2 tablets by mouth every 4 (four) hours as needed for pain. 11/13/11  Yes Elson Areas, PA  pantoprazole (PROTONIX) 40 MG tablet Take 1 tablet (40 mg total) by mouth 2 (two) times daily. 02/22/11  Yes Hilarie Fredrickson, MD  PARoxetine (PAXIL) 30 MG tablet Take 30 mg by mouth every morning.   Yes Historical Provider, MD  rosuvastatin (CRESTOR) 10 MG tablet Take 10 mg by mouth daily.     Yes Historical Provider, MD  vitamin E 400 UNIT capsule Take 400 Units by mouth daily.    Yes Historical Provider, MD  HYDROcodone-acetaminophen (VICODIN) 5-500 MG per tablet Take 1 tablet by mouth every 6 (six) hours as needed. For pain    Historical Provider, MD   Physical Exam: Filed Vitals:   11/16/11 1323 11/16/11 1331  BP:  133/99  Pulse:  111  Temp:  98 F (36.7 C)  TempSrc:  Oral  Resp:  20  Height:  4\' 11"  (1.499 m)  Weight:  64.864 kg (143 lb)  SpO2: 99% 99%  Physical Exam  Constitutional: Appears well-developed and well-nourished. No distress.  HENT: Normocephalic. External right and left ear normal. Oropharynx is clear and moist.  Eyes: Conjunctivae and EOM are normal. PERRLA, no scleral icterus.  Neck: Normal ROM. Neck supple. No JVD. No tracheal deviation. No thyromegaly.  CVS: RRR, S1/S2 +, no murmurs, no gallops, no carotid bruit.  Pulmonary: Effort and breath sounds normal, no stridor, rhonchi, wheezes, rales.  Abdominal: Soft. BS +,  no distension, tenderness, rebound or guarding.  Musculoskeletal: Normal range of motion. No edema and no tenderness.  Lymphadenopathy: No lymphadenopathy noted, cervical, inguinal. Neuro: Alert. Normal reflexes, muscle tone coordination. No cranial nerve deficit. Skin: Skin is warm and dry. No  rash noted. Not diaphoretic. No erythema. No pallor.  Psychiatric: Normal mood and affect. Behavior, judgment, thought content normal.   Labs on Admission:  Basic Metabolic Panel:  Lab 11/16/11 1610 11/13/11 1235  NA 140 141  K 3.6 3.6  CL 102 102  CO2 26 25  GLUCOSE 123* 137*  BUN 8 12  CREATININE 0.95 1.00  CALCIUM 9.3 9.2   Liver Function Tests:  Lab 11/16/11 1500  AST 21  ALT 10  ALKPHOS 96  BILITOT 0.3  PROT 7.1  ALBUMIN 3.4*   CBC:  Lab 11/16/11 1500 11/13/11 1235  WBC 6.7 9.0  HGB 12.7 11.9*  HCT 36.6 34.6*  MCV 83.9 85.6  PLT 312 258   Cardiac Enzymes:  Lab 11/16/11 1500  CKTOTAL --  CKMB --  TROPONINI <0.30   Radiological Exams on Admission: Dg Chest 2 View 11/16/2011  *IMPRESSION: No acute cardiopulmonary abnormality seen.   Original Report Authenticated By: Lupita Raider.,  M.D.     EKG: Normal sinus rhythm, no ST/T wave changes   Time spent: 75 minutes

## 2011-11-16 NOTE — ED Notes (Signed)
Per ems: pt from home, c/o ongoing headache and chest pain that started around 0900 this morning. Pt seen at [facility] last week for headache - was treated and sent home.  Reports ha continues since discharge, is sensitive to light. Denies n/v/ or shortness of breath. C/o pain on central chest with inspiration. Pt took aspirin and 2 nitro this morning, states chest pain has subsided. Reports hx of MI last year, heart murmur, HTN and seizures. bp 160/96, pulse 120 -ekg unremarkable per ems. saO2 99% ra, respirations 20, lung fields clear

## 2011-11-17 ENCOUNTER — Encounter (HOSPITAL_COMMUNITY): Payer: BC Managed Care – PPO

## 2011-11-17 DIAGNOSIS — M47812 Spondylosis without myelopathy or radiculopathy, cervical region: Secondary | ICD-10-CM

## 2011-11-17 DIAGNOSIS — F329 Major depressive disorder, single episode, unspecified: Secondary | ICD-10-CM | POA: Diagnosis not present

## 2011-11-17 DIAGNOSIS — I2510 Atherosclerotic heart disease of native coronary artery without angina pectoris: Secondary | ICD-10-CM | POA: Diagnosis not present

## 2011-11-17 DIAGNOSIS — K219 Gastro-esophageal reflux disease without esophagitis: Secondary | ICD-10-CM | POA: Diagnosis not present

## 2011-11-17 DIAGNOSIS — R079 Chest pain, unspecified: Secondary | ICD-10-CM | POA: Diagnosis not present

## 2011-11-17 DIAGNOSIS — R51 Headache: Secondary | ICD-10-CM

## 2011-11-17 DIAGNOSIS — R072 Precordial pain: Secondary | ICD-10-CM | POA: Diagnosis not present

## 2011-11-17 HISTORY — PX: TRANSTHORACIC ECHOCARDIOGRAM: SHX275

## 2011-11-17 LAB — CBC
HCT: 36.5 % (ref 36.0–46.0)
RDW: 13.2 % (ref 11.5–15.5)
WBC: 7 10*3/uL (ref 4.0–10.5)

## 2011-11-17 LAB — COMPREHENSIVE METABOLIC PANEL
ALT: 10 U/L (ref 0–35)
AST: 20 U/L (ref 0–37)
Albumin: 3.7 g/dL (ref 3.5–5.2)
Alkaline Phosphatase: 91 U/L (ref 39–117)
BUN: 12 mg/dL (ref 6–23)
Calcium: 9.9 mg/dL (ref 8.4–10.5)
Chloride: 101 mEq/L (ref 96–112)
Potassium: 4.3 mEq/L (ref 3.5–5.1)
Total Bilirubin: 0.3 mg/dL (ref 0.3–1.2)

## 2011-11-17 LAB — TROPONIN I: Troponin I: <0.3 ng/mL (ref <0.30)

## 2011-11-17 MED ORDER — METOCLOPRAMIDE HCL 5 MG/ML IJ SOLN
10.0000 mg | Freq: Once | INTRAMUSCULAR | Status: AC
  Administered 2011-11-17: 10 mg via INTRAVENOUS
  Filled 2011-11-17: qty 2

## 2011-11-17 MED ORDER — KETOROLAC TROMETHAMINE 30 MG/ML IJ SOLN
30.0000 mg | Freq: Three times a day (TID) | INTRAMUSCULAR | Status: DC | PRN
  Administered 2011-11-17 – 2011-11-18 (×3): 30 mg via INTRAVENOUS
  Filled 2011-11-17 (×2): qty 1

## 2011-11-17 NOTE — Consult Note (Signed)
Reason for Consult:Headache Referring Physician: [place]  CC: Headache  HPI: [name] is an 66 y.o. female who reports that she began to have headaches a few months ago.  The headaches were not severe and she managed them by taking hydrocodone which she already had at home for pain.  She reports a frequency of about 2-3 per week.  They started at the base of her neck in the back and radiated to the top of the head.  They were associated with nausea.  They were always relieved with the Hydrocodone.  On the day of admission the patient was out eating and had a severe headache.  It was of the same character as her previous headaches but more severe.  CP started soon after and EMS was called.  Patient was brought in for evaluation at that time.  Headache has been relieved with Toradol while hospitalized and currently the patient is headache free.   Prior to a few months ago the patient reports that she had no problem with headaches.    Past Medical History  Diagnosis Date  . Irritable bowel syndrome   . Esophageal reflux   . Dysphagia, unspecified   . Hypertension   . Depression   . Hypothyroidism   . Seizures   . Iron deficiency anemia   . Internal hemorrhoids   . Coronary artery disease   . Anxiety   . Arthritis   . Blood transfusion 1960  . Heart murmur   . Hyperlipidemia   . Myocardial infarction 11/2009    1 stent placed    Past Surgical History  Procedure Date  . Breast surgery 2010     benign cysts  . Coronary angioplasty with stent placement 2011 and 2012    drug-eluting stent w/ ion stents to the distal right coronary artery    Family History  Problem Relation Age of Onset  . Breast cancer Sister   . Breast cancer      niece  . Heart disease Father   . Colon cancer Neg Hx   . Heart attack Mother     -  Stroke                                                         Mother  Social History:  reports that she has quit smoking. She has never used smokeless tobacco.  She reports that she does not drink alcohol or use illicit drugs.  No Known Allergies  Medications:  I have reviewed the patient's current medications. Prior to Admission:  Prescriptions prior to admission  Medication Sig Dispense Refill  . ALPRAZolam (XANAX) 0.25 MG tablet Take 0.25 mg by mouth at bedtime as needed. For anxiety.      Marland Kitchen aspirin EC 81 MG EC tablet Take 1 tablet (81 mg total) by mouth daily.      . calcitRIOL (ROCALTROL) 0.5 MCG capsule Take 0.5 mcg by mouth daily.       . Calcium-Vitamin D 600-125 MG-UNIT TABS Take 1 tablet by mouth daily.        . clopidogrel (PLAVIX) 75 MG tablet Take 75 mg by mouth daily.       Marland Kitchen diltiazem (MATZIM LA) 240 MG 24 hr tablet Take 240 mg by mouth daily.      Marland Kitchen doxazosin (  CARDURA) 2 MG tablet Take 1 mg by mouth at bedtime.      . isosorbide mononitrate (IMDUR) 30 MG 24 hr tablet Take 1 tablet (30 mg total) by mouth daily.  30 tablet  11  . KLOR-CON M20 20 MEQ tablet Take 1 tablet by mouth Twice daily.      Marland Kitchen levETIRAcetam (KEPPRA) 500 MG tablet Take 500 mg by mouth every 12 (twelve) hours. Anti-seizure medication.  Consult needed.      Marland Kitchen levothyroxine (SYNTHROID, LEVOTHROID) 100 MCG tablet Take 100 mcg by mouth daily.        Marland Kitchen losartan (COZAAR) 100 MG tablet Take 1 tablet by mouth Daily.      . methocarbamol (ROBAXIN) 500 MG tablet Take 500 mg by mouth 4 (four) times daily.      . metoprolol succinate (TOPROL-XL) 25 MG 24 hr tablet Take 1 tablet (25 mg total) by mouth daily.  30 tablet  12  . nitroGLYCERIN (NITROSTAT) 0.4 MG SL tablet Place 0.4 mg under the tongue every 5 (five) minutes as needed.        Marland Kitchen oxyCODONE-acetaminophen (PERCOCET/ROXICET) 5-325 MG per tablet Take 2 tablets by mouth every 4 (four) hours as needed for pain.  16 tablet  0  . pantoprazole (PROTONIX) 40 MG tablet Take 1 tablet (40 mg total) by mouth 2 (two) times daily.  60 tablet  11  . PARoxetine (PAXIL) 30 MG tablet Take 30 mg by mouth every morning.      .  rosuvastatin (CRESTOR) 10 MG tablet Take 10 mg by mouth daily.        . vitamin E 400 UNIT capsule Take 400 Units by mouth daily.       Marland Kitchen HYDROcodone-acetaminophen (VICODIN) 5-500 MG per tablet Take 1 tablet by mouth every 6 (six) hours as needed. For pain       Scheduled:   . aspirin EC  81 mg Oral Daily  . atorvastatin  20 mg Oral q1800  . calcitRIOL  0.5 mcg Oral Daily  . calcium-vitamin D  1 tablet Oral Daily  . clopidogrel  75 mg Oral Daily  . diltiazem  240 mg Oral Daily  . doxazosin  1 mg Oral QHS  . [COMPLETED] HYDROmorphone  1 mg Intravenous Once  . isosorbide mononitrate  30 mg Oral Daily  . levETIRAcetam  500 mg Oral Q12H  . levothyroxine  100 mcg Oral QAC breakfast  . losartan  100 mg Oral Daily  . [COMPLETED] metoCLOPramide (REGLAN) injection  10 mg Intravenous Once  . metoprolol succinate  25 mg Oral Daily  . [COMPLETED] ondansetron (ZOFRAN) IV  4 mg Intravenous Once  . pantoprazole  40 mg Oral BID  . PARoxetine  30 mg Oral Daily  . potassium chloride SA  20 mEq Oral BID  . sodium chloride  3 mL Intravenous Q12H  . vitamin E  400 Units Oral Daily  . [DISCONTINUED] Calcium-Vitamin D  1 tablet Oral Daily  . [DISCONTINUED] methocarbamol  500 mg Oral QID    ROS: History obtained from the patient  General ROS: negative for - chills, fatigue, fever, night sweats, weight gain or weight loss Psychological ROS: negative for - behavioral disorder, hallucinations, memory difficulties, mood swings or suicidal ideation Ophthalmic ROS: negative for - blurry vision, double vision, eye pain or loss of vision ENT ROS: negative for - epistaxis, nasal discharge, oral lesions, sore throat, tinnitus or vertigo Allergy and Immunology ROS: negative for - hives or itchy/watery eyes  Hematological and Lymphatic ROS: negative for - bleeding problems, bruising or swollen lymph nodes Endocrine ROS: negative for - galactorrhea, hair pattern changes, polydipsia/polyuria or temperature  intolerance Respiratory ROS: negative for - cough, hemoptysis, shortness of breath or wheezing Cardiovascular ROS:  chest pain Gastrointestinal ROS: negative for - abdominal pain, diarrhea, hematemesis, nausea/vomiting or stool incontinence Genito-Urinary ROS: negative for - dysuria, hematuria, incontinence or urinary frequency/urgency Musculoskeletal ROS: neck pain, arthritis in hands Neurological ROS: as noted in HPI Dermatological ROS: negative for rash and skin lesion changes  Physical Examination: Blood pressure 96/62, pulse 86, temperature 98 F (36.7 C), temperature source Oral, resp. rate 16, height 4\' 11"  (1.499 m), weight 64.4 kg (141 lb 15.6 oz), SpO2 97.00%.  Neurologic Examination Mental Status: Alert, oriented, thought content appropriate.  Speech fluent without evidence of aphasia.  Able to follow 3 step commands without difficulty. Cranial Nerves: II: Discs flat bilaterally; Visual fields grossly normal, pupils equal, round, reactive to light and accommodation III,IV, VI: ptosis not present, extra-ocular motions intact bilaterally V,VII: decrease in right NLF, facial light touch sensation decreased on the right VIII: hearing normal bilaterally IX,X: gag reflex present XI: bilateral shoulder shrug XII: midline tongue extension Motor: Right : Upper extremity   4/5 with drift   Left:     Upper extremity   5/5  Lower extremity   4/5      Lower extremity   5/5 Hand grip weak bilaterally (R>L) Tone and bulk:normal tone throughout; no atrophy noted Sensory: Pinprick and light touch decreased on the right Deep Tendon Reflexes: 2+ and symmetric with absent AJ's bilaterally Plantars: Right: mute   Left: upgoing Cerebellar: normal finger-to-nose and normal heel-to-shin test Gait: normal gait and station CV: pulses palpable throughout    Laboratory Studies:   Basic Metabolic Panel:  Lab 11/17/11 9147 11/16/11 1500 11/13/11 1235  NA 138 140 141  K 4.3 3.6 3.6  CL 101  102 102  CO2 25 26 25   GLUCOSE 130* 123* 137*  BUN 12 8 12   CREATININE 2.07* 0.95 1.00  CALCIUM 9.9 9.3 9.2  MG -- -- --  PHOS -- -- --    Liver Function Tests:  Lab 11/17/11 0830 11/16/11 1500  AST 20 21  ALT 10 10  ALKPHOS 91 96  BILITOT 0.3 0.3  PROT 7.1 7.1  ALBUMIN 3.7 3.4*   No results found for this basename: LIPASE:5,AMYLASE:5 in the last 168 hours No results found for this basename: AMMONIA:3 in the last 168 hours  CBC:  Lab 11/17/11 0830 11/17/11 0635 11/16/11 1500 11/13/11 1235  WBC 7.0 QUESTIONABLE RESULTS, RECOMMEND RECOLLECT TO VERIFY 6.7 9.0  NEUTROABS -- -- -- 6.3  HGB 12.3 QUESTIONABLE RESULTS, RECOMMEND RECOLLECT TO VERIFY 12.7 11.9*  HCT 36.5 QUESTIONABLE RESULTS, RECOMMEND RECOLLECT TO VERIFY 36.6 34.6*  MCV 87.1 QUESTIONABLE RESULTS, RECOMMEND RECOLLECT TO VERIFY 83.9 85.6  PLT 298 QUESTIONABLE RESULTS, RECOMMEND RECOLLECT TO VERIFY 312 258    Cardiac Enzymes:  Lab 11/17/11 0830 11/17/11 0635 11/17/11 0037 11/16/11 1927 11/16/11 1500  CKTOTAL -- -- -- -- --  CKMB -- -- -- -- --  CKMBINDEX -- -- -- -- --  TROPONINI <0.30 QUESTIONABLE RESULTS, RECOMMEND RECOLLECT TO VERIFY <0.30 <0.30 <0.30    BNP: No components found with this basename: POCBNP:5  CBG:  Lab 11/17/11 0744  GLUCAP 102*    Microbiology: Results for orders placed in visit on 01/16/10  [facility] SMEAR     Status: Normal   Collection Time  01/30/10  1:23 PM      Component Value Range Status Comment   Smear Result Smear Available   Final     Coagulation Studies: No results found for this basename: LABPROT:5,INR:5 in the last 72 hours  Urinalysis: No results found for this basename: COLORURINE:2,APPERANCEUR:2,LABSPEC:2,PHURINE:2,GLUCOSEU:2,HGBUR:2,BILIRUBINUR:2,KETONESUR:2,PROTEINUR:2,UROBILINOGEN:2,NITRITE:2,LEUKOCYTESUR:2 in the last 168 hours  Lipid Panel:     Component Value Date/Time   CHOL  Value: 177        ATP III CLASSIFICATION:  <200     mg/dL   Desirable  829-562   mg/dL   Borderline High  >=130    mg/dL   High        86/57/8469 0450   TRIG 45 11/23/2009 0450   HDL 78 11/23/2009 0450   CHOLHDL 2.3 11/23/2009 0450   VLDL 9 11/23/2009 0450   LDLCALC  Value: 90        Total Cholesterol/HDL:CHD Risk Coronary Heart Disease Risk Table                     Men   Women  1/2 Average Risk   3.4   3.3  Average Risk       5.0   4.4  2 X Average Risk   9.6   7.1  3 X Average Risk  23.4   11.0        Use the calculated Patient Ratio above and the CHD Risk Table to determine the patient's CHD Risk.        ATP III CLASSIFICATION (LDL):  <100     mg/dL   Optimal  629-528  mg/dL   Near or Above                    Optimal  130-159  mg/dL   Borderline  413-244  mg/dL   High  >010     mg/dL   Very High 27/25/3664 0450    HgbA1C:  Lab Results  Component Value Date   HGBA1C  Value: 5.7 (NOTE)                                                                       According to the ADA Clinical Practice Recommendations for 2011, when HbA1c is used as a screening test:   >=6.5%   Diagnostic of Diabetes Mellitus           (if abnormal result  is confirmed)  5.7-6.4%   Increased risk of developing Diabetes Mellitus  References:Diagnosis and Classification of Diabetes Mellitus,Diabetes Care,2011,34(Suppl 1):S62-S69 and Standards of Medical Care in         Diabetes - 2011,Diabetes QIHK,7425,95  (Suppl 1):S11-S61.* 11/22/2009    Urine Drug Screen:   No results found for this basename: labopia, cocainscrnur, labbenz, amphetmu, thcu, labbarb    Alcohol Level: No results found for this basename: ETH:2 in the last 168 hours  Imaging: Dg Chest 2 View  11/16/2011  *RADIOLOGY REPORT*  Clinical Data: Chest pain.  CHEST - 2 VIEW  Comparison: January 02, 2011.  Findings: Cardiomediastinal silhouette appears normal.  No acute pulmonary disease is noted.  Bony thorax is intact.  IMPRESSION: No acute cardiopulmonary abnormality seen.   Original Report Authenticated By: Lupita Raider.,  M.D.       Assessment/Plan:  Patient Active Hospital Problem List: Headache (11/16/2011)   Assessment: Patient has now had headaches for a few months.  They start in the base of the neck and radiate forward.  She has had work up that includes a CT of the cervical spine.  This multilevel disc space narrowing and ventral endplate spurring.  CT of the brain shows atrophy on the left that is unchanged from her previous imaging and is likely from childhood based on our conversation today.  Based on history and clinical findings these headache are likely related to her cervical spondylosis.  She has been using her medications at home for their control which I feels likely appropriate for her to continue.     Plan:  1.  Patient to continue prn hydrocodone use for headaches.  Has been taking Toradol in the hospital but may try Hydrocodone if she has another headache while here to determine if it will continue to help her.  If not helpful may consider Ultram.    2.  No further neurologic intervention is recommended at this time.  If further questions arise, please call or page at that time.  Thank you for allowing neurology to participate in the care of this patient.  Patient may follow up with Dr. Geralyn Corwin as an outpatient.  She already sees him for her seizures.    Case discussed with Dr. Sonny Dandy, MD [facility] [phone] 11/17/2011, 3:44 PM

## 2011-11-17 NOTE — Evaluation (Signed)
Physical Therapy Evaluation Patient Details Name: [name] MRN: [id number] DOB: [birth year] Today's Date: 11/17/2011 Time: 4098-1191 PT Time Calculation (min): 16 min  PT Assessment / Plan / Recommendation Clinical Impression  Pt presents with chest pain with history of CAD s/p stent placement, HTN and seizure disorder.  Upon PT arrival, pt states she has headache with husband describing "helmet" like pattern (starts in back of head and comes up to top of head).  Tolerated getting OOB to chair, however declined ambulation due to headache.  Pt demos unsteadiness with mobility and requires min assist for all mobility.  Notified RN of headache.  Pt will benefit from skilled PT in acute venue to address deficits.  PT recommends HHPT with 24/7 supervision/assist at this time.  Husband states he can provide 24/7 care.      PT Assessment  Patient needs continued PT services    Follow Up Recommendations  Home health PT;Supervision/Assistance - 24 hour    Does the patient have the potential to tolerate intense rehabilitation      Barriers to Discharge None      Equipment Recommendations       Recommendations for Other Services OT consult   Frequency Min 3X/week    Precautions / Restrictions Precautions Precautions: Fall Restrictions Weight Bearing Restrictions: No   Pertinent Vitals/Pain 10/10 pain, headache, RN made aware       Mobility  Bed Mobility Bed Mobility: Supine to Sit;Sitting - Scoot to Edge of Bed Supine to Sit: HOB elevated;With rails;4: Min assist Sitting - Scoot to Edge of Bed: 5: Supervision Details for Bed Mobility Assistance: Some assist required for trunk to attain sitting position on EOB.  cues for hand placement and technique.  Pt/husband elevated HOB, however husband states they do not have hospital bed and normally she does not have trouble getting in/out of bed.  Noted some R UE weakness and flexion.  she did not use R hand as much to assist with getting  to EOB.  Transfers Transfers: Sit to Stand;Stand to [employer] Transfers Sit to Stand: 4: Min guard;With upper extremity assist;From bed Stand to Sit: 4: Min assist;With upper extremity assist;With armrests;To chair/3-in-1 Stand Pivot Transfers: 4: Min assist Details for Transfer Assistance: Assist to rise and steady.  Attempted to stand first time with some c/o dizziness, therefore had pt sit for a few moments then transfer to chair.  Pt unsteady and could probably benefit from AD, however pt declined ambulation due to headache.  Will continue to asssess mobiltiy.   Ambulation/Gait Ambulation/Gait Assistance: Not tested (comment) Stairs: No Wheelchair Mobility Wheelchair Mobility: No    Shoulder Instructions     Exercises     PT Diagnosis: Difficulty walking;Generalized weakness;Acute pain  PT Problem List: Decreased strength;Decreased activity tolerance;Decreased balance;Decreased mobility;Decreased safety awareness;Pain PT Treatment Interventions: DME instruction;Gait training;Stair training;Functional mobility training;Therapeutic activities;Therapeutic exercise;Balance training;Patient/family education   PT Goals Acute Rehab PT Goals PT Goal Formulation: With patient Time For Goal Achievement: 11/24/11 Potential to Achieve Goals: Good Pt will go Supine/Side to Sit: with modified independence PT Goal: Supine/Side to Sit - Progress: Goal set today Pt will go Sit to Supine/Side: with modified independence PT Goal: Sit to Supine/Side - Progress: Goal set today Pt will go Sit to Stand: with modified independence PT Goal: Sit to Stand - Progress: Goal set today Pt will go Stand to Sit: with modified independence PT Goal: Stand to Sit - Progress: Goal set today Pt will Ambulate: 51 - 150 feet;with supervision;with  least restrictive assistive device PT Goal: Ambulate - Progress: Goal set today Pt will Go Up / Down Stairs: 3-5 stairs;with supervision;with least restrictive  assistive device;with rail(s) PT Goal: Up/Down Stairs - Progress: Goal set today  Visit Information  Last PT Received On: 11/17/11 Assistance Needed: +1    Subjective Data  Subjective: My head hurts really bad.  Patient Stated Goal: to feel better   Prior Functioning  Home Living Lives With: Spouse Available Help at Discharge: Family;Available 24 hours/day Type of Home: House Home Access: Stairs to enter [employer] of Steps: 4-5 Entrance Stairs-Rails: Right Home Layout: One level Bathroom Shower/Tub: Engineer, manufacturing systems: Standard Home Adaptive Equipment: None Prior Function Level of Independence: Independent Able to Take Stairs?: Yes Driving: Yes Vocation: Retired Musician: No difficulties    Cognition  Overall Cognitive Status: Appears within functional limits for tasks assessed/performed Arousal/Alertness: Awake/alert Orientation Level: Appears intact for tasks assessed Behavior During Session: [facility] for tasks performed    Extremity/Trunk Assessment Right Upper Extremity Assessment RUE ROM/Strength/Tone: Deficits RUE ROM/Strength/Tone Deficits: noted slight constant flex in R arm/hand and pt did not use R hand as much to get OOB.   Right Lower Extremity Assessment RLE ROM/Strength/Tone: [facility] for tasks assessed Left Lower Extremity Assessment LLE ROM/Strength/Tone: WFL for tasks assessed   Balance    End of Session PT - End of Session Activity Tolerance: Patient limited by pain;Patient limited by fatigue Patient left: in chair;with call bell/phone within reach;with family/visitor present Nurse Communication: Mobility status  GP Functional Assessment Tool Used: Clinical judgement Functional Limitation: Mobility: Walking and moving around Mobility: Walking and Moving Around Current Status ([id number]): At least 20 percent but less than 40 percent impaired, limited or restricted Mobility: Walking and Moving Around Goal Status  [phone]): At least 1 percent but less than 20 percent impaired, limited or restricted   Lessie Dings 11/17/2011, 9:38 AM

## 2011-11-17 NOTE — Progress Notes (Signed)
11/17/11 [facility] chosen for HHPT [employer] informed.

## 2011-11-17 NOTE — Progress Notes (Addendum)
[facility] PROGRESS NOTE  [name] ZOX:[id number] DOB: 1945/08/14 DOA: 11/16/2011 PCP: Allean Found, MD  Assessment/Plan: 1-Chest pain: appears to be non cardiac in etiology; no further changes appreciated on telemetry or EKG and CE'z negative X 3. Will continue ASA, and plavix and will continue also PPI. 2-D echo pending.  2-Coronary artery disease: stable; will continue ASA, plavix, IMDUR and statins. Currently CP free. Will follow 2-D echo  3-GERD: continue PPI BID  4-HA: appears to be migraine in nature, description and symptoms. Will treat with toradol and reglan; will also remove opiates meds. Per family request and in order to help with symptoms relief and maintenance tx if required will ask neurology to see patient and provide further recommendations.  5-Hypertension: stable; continue current regimen.  6-Hyperlipidemia: continue statins  7-History of  Seizures: continue keppra  8-Hypothyroidism: continue synthroid  9-Depression: continue home regimen; no SI or hallucinations.  10-elevated Creatinine: ?? Lab error; Cr was WNL the day before. Will repeat BMET in am. Will hold Cozaar today  DVT: SCD's   Code Status: Full Family Communication: husband at bedside Disposition Plan: Home when medically stable   Consultants:  Neurology  Procedures:  2-D echo (results pending)  Antibiotics:  None   HPI/Subjective: Afebrile, denies CP or SOB. Patient complaining of HA (8-10); pain has been present for the last 2-3 weeks now and has only decrease in intensity but has never gone away.  Objective: Filed Vitals:   11/16/11 1331 11/16/11 1837 11/16/11 2045 11/17/11 0526  BP: 133/99 118/78 105/71 112/63  Pulse: 111 105 94 90  Temp: 98 F (36.7 C) 97.9 F (36.6 C) 98.1 F (36.7 C) 97.5 F (36.4 C)  TempSrc: Oral Oral Oral Oral  Resp: 20 18 18 16   Height: 4\' 11"  (1.499 m) 4\' 11"  (1.499 m)    Weight: 64.864 kg (143 lb) 67.1 kg (147 lb 14.9 oz)   64.4 kg (141 lb 15.6 oz)  SpO2: 99% 93% 97% 97%    Intake/Output Summary (Last 24 hours) at 11/17/11 1152 Last data filed at 11/17/11 0900  Gross per 24 hour  Intake    520 ml  Output      0 ml  Net    520 ml   Filed Weights   11/16/11 1331 11/16/11 1837 11/17/11 0526  Weight: 64.864 kg (143 lb) 67.1 kg (147 lb 14.9 oz) 64.4 kg (141 lb 15.6 oz)    Exam:   General:  Moderate distress secondary to HA; no fever  Cardiovascular: S1 and S2, no rubs or gallops  Respiratory: CTA bilaterally  Abdomen: soft, NT, ND, positive BS  Extremities: no edema, no cyanosis or clubbing  Neuro: non focal deficit appreciated; patient with MS 4/5 bilaterally due to poor effort, CN intact and able to follow commands and move limbs spontaneously  Data Reviewed: Basic Metabolic Panel:  Lab 11/17/11 8119 11/16/11 1500 11/13/11 1235  NA 138 140 141  K 4.3 3.6 3.6  CL 101 102 102  CO2 25 26 25   GLUCOSE 130* 123* 137*  BUN 12 8 12   CREATININE 2.07* 0.95 1.00  CALCIUM 9.9 9.3 9.2  MG -- -- --  PHOS -- -- --   Liver Function Tests:  Lab 11/17/11 0830 11/16/11 1500  AST 20 21  ALT 10 10  ALKPHOS 91 96  BILITOT 0.3 0.3  PROT 7.1 7.1  ALBUMIN 3.7 3.4*   CBC:  Lab 11/17/11 0830 11/17/11 0635 11/16/11 1500 11/13/11 1235  WBC 7.0 QUESTIONABLE  RESULTS, RECOMMEND RECOLLECT TO VERIFY 6.7 9.0  NEUTROABS -- -- -- 6.3  HGB 12.3 QUESTIONABLE RESULTS, RECOMMEND RECOLLECT TO VERIFY 12.7 11.9*  HCT 36.5 QUESTIONABLE RESULTS, RECOMMEND RECOLLECT TO VERIFY 36.6 34.6*  MCV 87.1 QUESTIONABLE RESULTS, RECOMMEND RECOLLECT TO VERIFY 83.9 85.6  PLT 298 QUESTIONABLE RESULTS, RECOMMEND RECOLLECT TO VERIFY 312 258   Cardiac Enzymes:  Lab 11/17/11 0830 11/17/11 0635 11/17/11 0037 11/16/11 1927 11/16/11 1500  CKTOTAL -- -- -- -- --  CKMB -- -- -- -- --  CKMBINDEX -- -- -- -- --  TROPONINI <0.30 QUESTIONABLE RESULTS, RECOMMEND RECOLLECT TO VERIFY <0.30 <0.30 <0.30   CBG:  Lab 11/17/11 0744  GLUCAP  102*      Studies: Dg Chest 2 View  11/16/2011  *RADIOLOGY REPORT*  Clinical Data: Chest pain.  CHEST - 2 VIEW  Comparison: January 02, 2011.  Findings: Cardiomediastinal silhouette appears normal.  No acute pulmonary disease is noted.  Bony thorax is intact.  IMPRESSION: No acute cardiopulmonary abnormality seen.   Original Report Authenticated By: Lupita Raider.,  M.D.     Scheduled Meds:   . aspirin EC  81 mg Oral Daily  . atorvastatin  20 mg Oral q1800  . calcitRIOL  0.5 mcg Oral Daily  . calcium-vitamin D  1 tablet Oral Daily  . clopidogrel  75 mg Oral Daily  . diltiazem  240 mg Oral Daily  . doxazosin  1 mg Oral QHS  . [COMPLETED] HYDROmorphone  1 mg Intravenous Once  . isosorbide mononitrate  30 mg Oral Daily  . levETIRAcetam  500 mg Oral Q12H  . levothyroxine  100 mcg Oral QAC breakfast  . [COMPLETED] LORazepam  1 mg Intravenous Once  . losartan  100 mg Oral Daily  . metoprolol succinate  25 mg Oral Daily  . [COMPLETED]  morphine injection  4 mg Intravenous Once  . [COMPLETED] ondansetron (ZOFRAN) IV  4 mg Intravenous Once  . [COMPLETED] ondansetron (ZOFRAN) IV  4 mg Intravenous Once  . pantoprazole  40 mg Oral BID  . PARoxetine  30 mg Oral Daily  . potassium chloride SA  20 mEq Oral BID  . sodium chloride  3 mL Intravenous Q12H  . vitamin E  400 Units Oral Daily  . [DISCONTINUED] Calcium-Vitamin D  1 tablet Oral Daily  . [DISCONTINUED] methocarbamol  500 mg Oral QID   Continuous Infusions:   . sodium chloride 20 mL/hr at 11/16/11 1458     Time spent: >30 minutes    Ashutosh Dieguez  [facility] Pager [phone]. If 8PM-8AM, please contact night-coverage at [email or web address], password [facility] 11/17/2011, 11:52 AM  LOS: 1 day

## 2011-11-17 NOTE — Progress Notes (Signed)
  Echocardiogram 2D Echocardiogram has been performed.  Cathie Beams 11/17/2011, 9:49 AM

## 2011-11-18 DIAGNOSIS — R079 Chest pain, unspecified: Secondary | ICD-10-CM | POA: Diagnosis not present

## 2011-11-18 DIAGNOSIS — K219 Gastro-esophageal reflux disease without esophagitis: Secondary | ICD-10-CM | POA: Diagnosis not present

## 2011-11-18 DIAGNOSIS — I10 Essential (primary) hypertension: Secondary | ICD-10-CM

## 2011-11-18 DIAGNOSIS — F329 Major depressive disorder, single episode, unspecified: Secondary | ICD-10-CM | POA: Diagnosis not present

## 2011-11-18 DIAGNOSIS — E039 Hypothyroidism, unspecified: Secondary | ICD-10-CM

## 2011-11-18 DIAGNOSIS — I2510 Atherosclerotic heart disease of native coronary artery without angina pectoris: Secondary | ICD-10-CM | POA: Diagnosis not present

## 2011-11-18 LAB — BASIC METABOLIC PANEL
CO2: 26 mEq/L (ref 19–32)
Chloride: 104 mEq/L (ref 96–112)
Glucose, Bld: 103 mg/dL — ABNORMAL HIGH (ref 70–99)
Potassium: 4.6 mEq/L (ref 3.5–5.1)
Sodium: 140 mEq/L (ref 135–145)

## 2011-11-18 NOTE — Discharge Summary (Signed)
Physician Discharge Summary  [name] MVH:[id number] DOB: 27-Oct-1945 DOA: 11/16/2011  PCP: Allean Found, MD  Admit date: 11/16/2011 Discharge date: 11/18/2011  Time spent: 40 minutes  Recommendations for Outpatient Follow-up:  1. Recommend follow up with PCP 1 week. Recommend BMET for monitoring of renal function. Recommend BP check monitoring for optimal control. Holding ARB at discharge due to creatinine.  2. Recommend follow up with neuro Dr. Anne [name] as needed for headaches  Discharge Diagnoses:  Chest pain Coronary artery disease GERD Hypertension Hyperlipidemia History of  seizures Cervical spondylosis Acute on chronic renal failure   Discharge Condition: Pt is medically stable and ready for discharge to home  Diet recommendation: heart healthy  Filed Weights   11/16/11 1837 11/17/11 0526 11/18/11 0515  Weight: 67.1 kg (147 lb 14.9 oz) 64.4 kg (141 lb 15.6 oz) 64.7 kg (142 lb 10.2 oz)    History of present illness:   [name] is 66 year old female with past medically history significant for CAD status post history of stent placements in the right coronary artery and posterolateral artery and most recent (03/10/2011) cardiac cath with findings of distal RCA 50-60% lesion with recommendation to add imdur, HTN, Dyslipidemia, Anxiety, GERD, Hypothyroidism, Seizure disorder who presented to ED on 11/16/11  with complaints of sudden onset retrosternal chest pain, 6-7/10 in intensity, non radiating and relieved by nitroglycerin. Patient reported chest pain started at rest and there were no aggravating symptoms. Chest pain started 1 day prior to admission, lasting for few minutes and constant and dull in nature. No associated shortness of breath, no palpitations, no sweating. No abdominal pain, no nausea or vomiting. No reports of fever or chills, no cough. No reports of blood in stool or urine. No lightheadedness or dizziness or loss of consciousness. Pt was admitted to  tele for further evaluation  Hospital Course:  1-Chest pain: admitted to tele. No further changes appreciated on telemetry or EKG and CE'z negative X 3. Will continue ASA, and plavix and will continue also PPI. 2-D echo yields 55-65% EF with grade 1 diastolic dysfunction; no wall motion abnormalities. Pt had no further episodes of CP during this hospitalization. Will follow with PCP at discharge.  2-Coronary artery disease: see #1. stable; will continue ASA, plavix, IMDUR and statins.    3-GERD: remained stable during hospitalization. continue PPI BID   4-HA:  Initially appearred to be migraine in nature, description and symptoms. Toradol and reglan provided; opiates held. Seen by Dr. Thad [name] with neurology who opined based on history and clinical findings these headache are likely related to her cervical spondylosis. Recommended continuing home meds for control. Recommend OP follow up with Dr. Anne [name] as needed. At discharge headache much improved.   5-Hypertension: stable during hospitalization; SBP range 96-120. Have discontinued Cozaar at discharge due to creatinine level. Recommend close OP follow up for optimal control given change in meds.   6-Hyperlipidemia: continue statins   7-History of Seizures: stable during hospitalization. No seizure activity.  continue keppra   8-Hypothyroidism: continue synthroid   9-Depression: stable. continue home regimen; no SI or hallucinations.   10-Acute on chronic renal failure (stage II by GFR): likely related to ARB use and decreased po intake of late. Creatinine slowly trending down at discharge. Range during hospitalization 0.95-1.93. Will discontinue Cozaar and instruct pt to say well hydrated. Recommend OP follow up with PCP 1 week. Recommend BMET to monitor renal function and BP check for optimal BP control. May need further adjustments to meds.  DVT: SCD's   Procedures: None  Consultations:  Neurology  Discharge Exam: Filed Vitals:     11/17/11 0526 11/17/11 1400 11/17/11 2200 11/18/11 0515  BP: 112/63 96/62 111/68 120/79  Pulse: 90 86 78 75  Temp: 97.5 F (36.4 C) 98 F (36.7 C) 98.3 F (36.8 C) 97.8 F (36.6 C)  TempSrc: Oral Oral Oral Oral  Resp: 16 16 16 16   Height:      Weight: 64.4 kg (141 lb 15.6 oz)   64.7 kg (142 lb 10.2 oz)  SpO2: 97% 97% 96% 100%    General: awake alert NAD Cardiovascular: RRR No MGR Respiratory: normal effort BSCTAB No wheeze/rhonchi  Discharge Instructions  Discharge Orders    Future Orders Please Complete By Expires   Diet - low sodium heart healthy      Increase activity slowly      Discharge instructions      Comments:   Stay well hydrated.  Follow up with PCP 1 week. Recommend BMET to monitor renal function.   Recommend OP follow up on BP control and adjustment of meds as indicated Follow up with Dr. Anne [name] prn       Medication List     As of 11/18/2011  9:45 AM    STOP taking these medications         losartan 100 MG tablet   Commonly known as: COZAAR      TAKE these medications         ALPRAZolam 0.25 MG tablet   Commonly known as: XANAX   Take 0.25 mg by mouth at bedtime as needed. For anxiety.      aspirin 81 MG EC tablet   Take 1 tablet (81 mg total) by mouth daily.      calcitRIOL 0.5 MCG capsule   Commonly known as: ROCALTROL   Take 0.5 mcg by mouth daily.      Calcium-Vitamin D 600-125 MG-UNIT Tabs   Take 1 tablet by mouth daily.      clopidogrel 75 MG tablet   Commonly known as: PLAVIX   Take 75 mg by mouth daily.      doxazosin 2 MG tablet   Commonly known as: CARDURA   Take 1 mg by mouth at bedtime.      HYDROcodone-acetaminophen 5-500 MG per tablet   Commonly known as: VICODIN   Take 1 tablet by mouth every 6 (six) hours as needed. For pain      isosorbide mononitrate 30 MG 24 hr tablet   Commonly known as: IMDUR   Take 1 tablet (30 mg total) by mouth daily.      KLOR-CON M20 20 MEQ tablet   Generic drug: potassium chloride  SA   Take 1 tablet by mouth Twice daily.      levETIRAcetam 500 MG tablet   Commonly known as: KEPPRA   Take 500 mg by mouth every 12 (twelve) hours. Anti-seizure medication.  Consult needed.      levothyroxine 100 MCG tablet   Commonly known as: SYNTHROID, LEVOTHROID   Take 100 mcg by mouth daily.      MATZIM LA 240 MG 24 hr tablet   Generic drug: diltiazem   Take 240 mg by mouth daily.      methocarbamol 500 MG tablet   Commonly known as: ROBAXIN   Take 500 mg by mouth 4 (four) times daily.      metoprolol succinate 25 MG 24 hr tablet   Commonly known as: TOPROL-XL  Take 1 tablet (25 mg total) by mouth daily.      nitroGLYCERIN 0.4 MG SL tablet   Commonly known as: NITROSTAT   Place 0.4 mg under the tongue every 5 (five) minutes as needed.      oxyCODONE-acetaminophen 5-325 MG per tablet   Commonly known as: PERCOCET/ROXICET   Take 2 tablets by mouth every 4 (four) hours as needed for pain.      pantoprazole 40 MG tablet   Commonly known as: PROTONIX   Take 1 tablet (40 mg total) by mouth 2 (two) times daily.      PARoxetine 30 MG tablet   Commonly known as: PAXIL   Take 30 mg by mouth every morning.      rosuvastatin 10 MG tablet   Commonly known as: CRESTOR   Take 10 mg by mouth daily.      vitamin E 400 UNIT capsule   Take 400 Units by mouth daily.           Follow-up Information    Follow up with Allean Found, MD. Schedule an appointment as soon as possible for a visit in 1 week. (recommend BMET for renal funtion monitoring and BP check for optimal control. )    Contact information:   [address] [place] [address] [phone]       Call Lesly Dukes, MD. (As needed)    Contact information:   [address], [address] [address] [facility] [place] [address] [phone]           The results of significant diagnostics from this hospitalization (including imaging, microbiology, ancillary and  laboratory) are listed below for reference.    Significant Diagnostic Studies: Dg Chest 2 View  11/16/2011  *RADIOLOGY REPORT*  Clinical Data: Chest pain.  CHEST - 2 VIEW  Comparison: January 02, 2011.  Findings: Cardiomediastinal silhouette appears normal.  No acute pulmonary disease is noted.  Bony thorax is intact.  IMPRESSION: No acute cardiopulmonary abnormality seen.   Original Report Authenticated By: Lupita Raider.,  M.D.    Ct Head Wo Contrast  11/13/2011  *RADIOLOGY REPORT*  Clinical Data:  Neck pain and headache  CT HEAD WITHOUT CONTRAST CT CERVICAL SPINE WITHOUT CONTRAST  Technique:  Multidetector CT imaging of the head and cervical spine was performed following the standard protocol without intravenous contrast.  Multiplanar CT image reconstructions of the cervical spine were also generated.  Comparison:   06/21/2009  CT HEAD  Findings: Again noted is prominent CSF space on the left with atrophy or incomplete development of the superior left temporal lobe and posterior left frontal lobe.  Scattered subcortical white matter hypo attenuation is noted bilaterally.  This appears similar to the previous examination. There is no evidence for acute brain infarct, hemorrhage or mass. The paranasal sinuses and mastoid air cells are clear.  The skull is intact.  IMPRESSION:  1.  No acute intracranial abnormalities. 2.  Normal sulcation pattern involving the left temporal and posterior frontal lobe.  Unchanged from previous exam.  CT CERVICAL SPINE  Findings: Normal alignment of the cervical spine.  The vertebral body heights are well preserved.  There is multilevel disc space narrowing and ventral endplate spurring.  IMPRESSION:  1.  No acute findings. 2.  Cervical spondylosis.   Original Report Authenticated By: Signa Kell, M.D.    Ct Angio Neck W/cm &/or Wo/cm  11/13/2011  *RADIOLOGY REPORT*  Clinical Data:  Severe neck pain and left arm  pain for 3 weeks.  CT ANGIOGRAPHY NECK  Technique:   Multidetector CT imaging of the neck was performed using the standard protocol during bolus administration of intravenous contrast.  Multiplanar CT image reconstructions including MIPs were obtained to evaluate the vascular anatomy. Carotid stenosis measurements (when applicable) are obtained utilizing NASCET criteria, using the distal internal carotid diameter as the denominator.  Contrast: OMNIPAQUE IOHEXOL 350 MG/ML SOLN  Comparison:  CT cervical spine 11/13/2011.  Findings:  Conventional branching of the great vessels from the arch.  Minimal proximal atheromatous calcification affects the right greater than left subclavians.  Nonstenotic ostial atherosclerotic calcification affects the right vertebral but not the left.  Both are equal in size.  There is no vertebral stenosis or dissection.  Both common carotid arteries are patent through the neck.  There is nonstenotic atherosclerotic change at both carotid bifurcations which is primarily calcific.  No soft plaque or ulceration is seen. There is no cervical internal carotid artery dissection or fibromuscular dysplasia.  There is no neck mass identified.  The lung apices are clear.  There is no mediastinal mass.  The trachea is midline.  There is no lesion of the mandible or maxilla. Visualized intracranial compartment and paranasal sinuses are clear.  Left-sided neural foraminal narrowing due to uncinate spurring is present at C5-6 potentially affecting the left C6 nerve root. Correlate clinically for C6 radiculopathy.   Review of the MIP images confirms the above findings.  IMPRESSION: No evidence for carotid stenosis or craniocervical vascular dissection.  Cervical spondylosis with neural foraminal narrowing and disc space narrowing most pronounced at C5-6 on the left.  Correlate clinically for left C6 radiculopathy.   Original Report Authenticated By: Davonna Belling, M.D.    Ct Cervical Spine Wo Contrast  11/13/2011  *RADIOLOGY REPORT*  Clinical Data:   Neck pain and headache  CT HEAD WITHOUT CONTRAST CT CERVICAL SPINE WITHOUT CONTRAST  Technique:  Multidetector CT imaging of the head and cervical spine was performed following the standard protocol without intravenous contrast.  Multiplanar CT image reconstructions of the cervical spine were also generated.  Comparison:   06/21/2009  CT HEAD  Findings: Again noted is prominent CSF space on the left with atrophy or incomplete development of the superior left temporal lobe and posterior left frontal lobe.  Scattered subcortical white matter hypo attenuation is noted bilaterally.  This appears similar to the previous examination. There is no evidence for acute brain infarct, hemorrhage or mass. The paranasal sinuses and mastoid air cells are clear.  The skull is intact.  IMPRESSION:  1.  No acute intracranial abnormalities. 2.  Normal sulcation pattern involving the left temporal and posterior frontal lobe.  Unchanged from previous exam.  CT CERVICAL SPINE  Findings: Normal alignment of the cervical spine.  The vertebral body heights are well preserved.  There is multilevel disc space narrowing and ventral endplate spurring.  IMPRESSION:  1.  No acute findings. 2.  Cervical spondylosis.   Original Report Authenticated By: Signa Kell, M.D.     Microbiology: No results found for this or any previous visit (from the past 240 hour(s)).   Labs: Basic Metabolic Panel:  Lab 11/18/11 1610 11/17/11 0830 11/16/11 1500 11/13/11 1235  NA 140 138 140 141  K 4.6 4.3 3.6 3.6  CL 104 101 102 102  CO2 26 25 26 25   GLUCOSE 103* 130* 123* 137*  BUN 15 12 8 12   CREATININE 1.93* 2.07* 0.95 1.00  CALCIUM 9.9 9.9 9.3 9.2  MG -- -- -- --  PHOS -- -- -- --   Liver Function Tests:  Lab 11/17/11 0830 11/16/11 1500  AST 20 21  ALT 10 10  ALKPHOS 91 96  BILITOT 0.3 0.3  PROT 7.1 7.1  ALBUMIN 3.7 3.4*   CBC:  Lab 11/17/11 0830 11/17/11 0635 11/16/11 1500 11/13/11 1235  WBC 7.0 QUESTIONABLE RESULTS, RECOMMEND  RECOLLECT TO VERIFY 6.7 9.0  NEUTROABS -- -- -- 6.3  HGB 12.3 QUESTIONABLE RESULTS, RECOMMEND RECOLLECT TO VERIFY 12.7 11.9*  HCT 36.5 QUESTIONABLE RESULTS, RECOMMEND RECOLLECT TO VERIFY 36.6 34.6*  MCV 87.1 QUESTIONABLE RESULTS, RECOMMEND RECOLLECT TO VERIFY 83.9 85.6  PLT 298 QUESTIONABLE RESULTS, RECOMMEND RECOLLECT TO VERIFY 312 258   Cardiac Enzymes:  Lab 11/17/11 0830 11/17/11 0635 11/17/11 0037 11/16/11 1927 11/16/11 1500  CKTOTAL -- -- -- -- --  CKMB -- -- -- -- --  CKMBINDEX -- -- -- -- --  TROPONINI <0.30 QUESTIONABLE RESULTS, RECOMMEND RECOLLECT TO VERIFY <0.30 <0.30 <0.30   CBG:  Lab 11/17/11 0744  GLUCAP 102*       Signed: BLACK,KAREN M  [facility] 11/18/2011, 9:45 AM   Patient seen, examined and discussed in details with NP Toya Smothers; please referred to above discharge summary for admission details, findings and further follow up needs.  Dr. Gwenlyn Perking [phone]

## 2011-11-18 NOTE — Progress Notes (Signed)
Physical Therapy Treatment Patient Details Name: [name] MRN: [id number] DOB: Dec 15, 1945 Today's Date: 11/18/2011 Time: 1033-1050 PT Time Calculation (min): 17 min  PT Assessment / Plan / Recommendation Comments on Treatment Session  Pt reports she's been in bed a couple days and feels weak. She c/o dizziness while walking. BP sitting 144/60, standing 109/70, RN aware. Encouraged pt to have assist when walking at home for safety.     Follow Up Recommendations  Home health PT;Supervision/Assistance - 24 hour     Does the patient have the potential to tolerate intense rehabilitation     Barriers to Discharge        Equipment Recommendations  None recommended by PT    Recommendations for Other Services OT consult  Frequency Min 3X/week   Plan Discharge plan remains appropriate    Precautions / Restrictions Precautions Precautions: Fall Restrictions Weight Bearing Restrictions: No   Pertinent Vitals/Pain *3/10 headache, pt premedicated BP sitting 144/60, standing 109/70, RN notified**    Mobility  Bed Mobility Bed Mobility: Supine to Sit;Sitting - Scoot to Edge of Bed Supine to Sit: HOB elevated;With rails;6: Modified independent (Device/Increase time) Sitting - Scoot to Edge of Bed: 6: Modified independent (Device/Increase time) Transfers Transfers: Sit to Stand;Stand to Sit Sit to Stand: 4: Min guard;With upper extremity assist;From bed Stand to Sit: With upper extremity assist;With armrests;To chair/3-in-1;4: Min guard Details for Transfer Assistance: min/guard for safety/balance Ambulation/Gait Ambulation/Gait Assistance: 4: Min guard Ambulation Distance (Feet): 80 Feet Assistive device: None General Gait Details: pt reported dizziness while walking. Assessed orthostatics, sitting BP 144/60, standing 109/70, RN notified Stairs: No Wheelchair Mobility Wheelchair Mobility: No    Exercises     PT Diagnosis:    PT Problem List:   PT Treatment Interventions:      PT Goals    Visit Information  Last PT Received On: 11/18/11 Assistance Needed: +1    Subjective Data  Subjective: I feel dizzy. (while walking) Patient Stated Goal: return to prior level of function   Cognition  Overall Cognitive Status: Appears within functional limits for tasks assessed/performed Arousal/Alertness: Awake/alert Orientation Level: Appears intact for tasks assessed Behavior During Session: [facility] for tasks performed    Balance  Balance Balance Assessed: Yes Static Sitting Balance Static Sitting - Balance Support: Feet supported Static Sitting - Level of Assistance: 7: Independent Static Sitting - Comment/# of Minutes: 2  End of Session PT - End of Session Activity Tolerance: Patient limited by fatigue Patient left: in chair;with call bell/phone within reach Nurse Communication: Mobility status   GP Functional Assessment Tool Used: clinical judgement Functional Limitation: Mobility: Walking and moving around Mobility: Walking and Moving Around Discharge Status [phone]): At least 1 percent but less than 20 percent impaired, limited or restricted   Tamala Ser 11/18/2011, 10:57 AM [phone]

## 2011-11-18 NOTE — Care Management Note (Addendum)
    Page 1 of 1   11/18/2011     11:28:43 AM   CARE MANAGEMENT NOTE 11/18/2011  Patient:  [name], [name]   Account Number:  [id number]  Date Initiated:  11/18/2011  Documentation initiated by:  Lanier Clam  Subjective/Objective Assessment:   ADMITTED W/CHEST PAIN.     Action/Plan:   FROM HOME   Anticipated DC Date:  11/18/2011   Anticipated DC Plan:  HOME W HOME HEALTH SERVICES      DC Planning Services  CM consult      Choice offered to / List presented to:  C-1 Patient        HH arranged  HH-2 PT      [facility] agency  [facility]   Status of service:  Completed, signed off Medicare Important Message given?   (If response is "NO", the following Medicare IM given date fields will be blank) Date Medicare IM given:   Date Additional Medicare IM given:    Discharge Disposition:  HOME W HOME HEALTH SERVICES  Per UR Regulation:  Reviewed for med. necessity/level of care/duration of stay  If discussed at Long Length of Stay Meetings, dates discussed:    Comments:  11/18/11 Gertha Lichtenberg RN,BSN NCM [phone] [facility] CHOSEN FOR HHPT,SUSAN(LIASON) AWARE OF HH ORDERS,& D/C HOME TODAY.

## 2011-11-19 ENCOUNTER — Encounter (HOSPITAL_COMMUNITY): Payer: BC Managed Care – PPO

## 2011-11-19 LAB — GLUCOSE, CAPILLARY

## 2011-11-22 ENCOUNTER — Encounter (HOSPITAL_COMMUNITY): Payer: BC Managed Care – PPO

## 2011-11-23 DIAGNOSIS — R51 Headache: Secondary | ICD-10-CM | POA: Diagnosis not present

## 2011-11-23 DIAGNOSIS — G40309 Generalized idiopathic epilepsy and epileptic syndromes, not intractable, without status epilepticus: Secondary | ICD-10-CM | POA: Diagnosis not present

## 2011-11-23 DIAGNOSIS — I10 Essential (primary) hypertension: Secondary | ICD-10-CM | POA: Diagnosis not present

## 2011-11-23 DIAGNOSIS — M47812 Spondylosis without myelopathy or radiculopathy, cervical region: Secondary | ICD-10-CM | POA: Diagnosis not present

## 2011-11-24 ENCOUNTER — Encounter (HOSPITAL_COMMUNITY): Payer: BC Managed Care – PPO

## 2011-11-26 ENCOUNTER — Encounter (HOSPITAL_COMMUNITY): Payer: BC Managed Care – PPO

## 2011-11-29 ENCOUNTER — Encounter (HOSPITAL_COMMUNITY): Payer: BC Managed Care – PPO

## 2011-12-01 ENCOUNTER — Encounter (HOSPITAL_COMMUNITY): Payer: BC Managed Care – PPO

## 2011-12-03 ENCOUNTER — Encounter (HOSPITAL_COMMUNITY): Payer: BC Managed Care – PPO

## 2011-12-03 ENCOUNTER — Ambulatory Visit: Payer: Medicare Other | Attending: Neurology | Admitting: *Deleted

## 2011-12-03 DIAGNOSIS — R293 Abnormal posture: Secondary | ICD-10-CM | POA: Diagnosis not present

## 2011-12-03 DIAGNOSIS — M542 Cervicalgia: Secondary | ICD-10-CM | POA: Diagnosis not present

## 2011-12-03 DIAGNOSIS — IMO0001 Reserved for inherently not codable concepts without codable children: Secondary | ICD-10-CM | POA: Diagnosis not present

## 2011-12-03 DIAGNOSIS — M2560 Stiffness of unspecified joint, not elsewhere classified: Secondary | ICD-10-CM | POA: Diagnosis not present

## 2011-12-06 ENCOUNTER — Encounter (HOSPITAL_COMMUNITY): Payer: BC Managed Care – PPO

## 2011-12-07 ENCOUNTER — Ambulatory Visit: Payer: Medicare Other | Admitting: Physical Therapy

## 2011-12-07 ENCOUNTER — Other Ambulatory Visit: Payer: Self-pay | Admitting: Neurology

## 2011-12-07 DIAGNOSIS — M4712 Other spondylosis with myelopathy, cervical region: Secondary | ICD-10-CM

## 2011-12-08 ENCOUNTER — Encounter (HOSPITAL_COMMUNITY): Payer: BC Managed Care – PPO

## 2011-12-08 DIAGNOSIS — I10 Essential (primary) hypertension: Secondary | ICD-10-CM | POA: Diagnosis not present

## 2011-12-08 DIAGNOSIS — E7800 Pure hypercholesterolemia, unspecified: Secondary | ICD-10-CM | POA: Diagnosis not present

## 2011-12-13 ENCOUNTER — Encounter (HOSPITAL_COMMUNITY): Payer: BC Managed Care – PPO

## 2011-12-14 ENCOUNTER — Ambulatory Visit: Payer: Medicare Other | Attending: Neurology | Admitting: Physical Therapy

## 2011-12-14 ENCOUNTER — Ambulatory Visit
Admission: RE | Admit: 2011-12-14 | Discharge: 2011-12-14 | Disposition: A | Discharge status: Home / Self Care | Payer: Medicare Other | Source: Ambulatory Visit | Attending: Neurology | Admitting: Neurology

## 2011-12-14 DIAGNOSIS — M47812 Spondylosis without myelopathy or radiculopathy, cervical region: Secondary | ICD-10-CM | POA: Diagnosis not present

## 2011-12-14 DIAGNOSIS — M542 Cervicalgia: Secondary | ICD-10-CM | POA: Diagnosis not present

## 2011-12-14 DIAGNOSIS — R293 Abnormal posture: Secondary | ICD-10-CM | POA: Diagnosis not present

## 2011-12-14 DIAGNOSIS — M2560 Stiffness of unspecified joint, not elsewhere classified: Secondary | ICD-10-CM | POA: Insufficient documentation

## 2011-12-14 DIAGNOSIS — M4712 Other spondylosis with myelopathy, cervical region: Secondary | ICD-10-CM

## 2011-12-14 DIAGNOSIS — IMO0001 Reserved for inherently not codable concepts without codable children: Secondary | ICD-10-CM | POA: Diagnosis not present

## 2011-12-15 ENCOUNTER — Encounter (HOSPITAL_COMMUNITY): Payer: BC Managed Care – PPO

## 2011-12-16 ENCOUNTER — Ambulatory Visit: Payer: Medicare Other | Admitting: Physical Therapy

## 2011-12-17 ENCOUNTER — Encounter (HOSPITAL_COMMUNITY): Payer: BC Managed Care – PPO

## 2011-12-20 ENCOUNTER — Encounter (HOSPITAL_COMMUNITY): Payer: BC Managed Care – PPO

## 2011-12-20 ENCOUNTER — Other Ambulatory Visit: Payer: Self-pay | Admitting: Neurology

## 2011-12-20 DIAGNOSIS — M47812 Spondylosis without myelopathy or radiculopathy, cervical region: Secondary | ICD-10-CM

## 2011-12-20 DIAGNOSIS — R51 Headache: Secondary | ICD-10-CM

## 2011-12-21 ENCOUNTER — Ambulatory Visit: Payer: Medicare Other | Admitting: Physical Therapy

## 2011-12-22 ENCOUNTER — Other Ambulatory Visit: Payer: Self-pay

## 2011-12-22 ENCOUNTER — Encounter (HOSPITAL_COMMUNITY): Payer: BC Managed Care – PPO

## 2011-12-22 DIAGNOSIS — F329 Major depressive disorder, single episode, unspecified: Secondary | ICD-10-CM | POA: Diagnosis not present

## 2011-12-22 DIAGNOSIS — J329 Chronic sinusitis, unspecified: Secondary | ICD-10-CM | POA: Diagnosis not present

## 2011-12-22 DIAGNOSIS — D485 Neoplasm of uncertain behavior of skin: Secondary | ICD-10-CM | POA: Diagnosis not present

## 2011-12-23 ENCOUNTER — Ambulatory Visit: Payer: Medicare Other | Admitting: Physical Therapy

## 2011-12-24 ENCOUNTER — Encounter (HOSPITAL_COMMUNITY): Payer: BC Managed Care – PPO

## 2011-12-27 ENCOUNTER — Encounter (HOSPITAL_COMMUNITY): Payer: BC Managed Care – PPO

## 2011-12-27 ENCOUNTER — Ambulatory Visit
Admission: RE | Admit: 2011-12-27 | Discharge: 2011-12-27 | Disposition: A | Discharge status: Home / Self Care | Payer: Medicare Other | Source: Ambulatory Visit | Attending: Neurology | Admitting: Neurology

## 2011-12-27 VITALS — BP 146/89 | HR 67

## 2011-12-27 DIAGNOSIS — M5020 Other cervical disc displacement, unspecified cervical region: Secondary | ICD-10-CM | POA: Diagnosis not present

## 2011-12-27 DIAGNOSIS — M47812 Spondylosis without myelopathy or radiculopathy, cervical region: Secondary | ICD-10-CM

## 2011-12-27 DIAGNOSIS — R51 Headache: Secondary | ICD-10-CM

## 2011-12-27 MED ORDER — IOHEXOL 300 MG/ML  SOLN
1.0000 mL | Freq: Once | INTRAMUSCULAR | Status: AC | PRN
  Administered 2011-12-27: 1 mL via EPIDURAL

## 2011-12-27 MED ORDER — TRIAMCINOLONE ACETONIDE 40 MG/ML IJ SUSP (RADIOLOGY)
60.0000 mg | Freq: Once | INTRAMUSCULAR | Status: AC
  Administered 2011-12-27: 60 mg via EPIDURAL

## 2011-12-28 ENCOUNTER — Ambulatory Visit: Payer: Medicare Other | Admitting: Physical Therapy

## 2011-12-29 ENCOUNTER — Encounter (HOSPITAL_COMMUNITY): Payer: BC Managed Care – PPO

## 2011-12-29 ENCOUNTER — Ambulatory Visit: Payer: Medicare Other | Admitting: Physical Therapy

## 2011-12-30 ENCOUNTER — Ambulatory Visit: Payer: Medicare Other | Admitting: Physical Therapy

## 2011-12-31 ENCOUNTER — Encounter (HOSPITAL_COMMUNITY): Payer: BC Managed Care – PPO

## 2012-01-03 ENCOUNTER — Encounter (HOSPITAL_COMMUNITY): Payer: BC Managed Care – PPO

## 2012-01-04 ENCOUNTER — Encounter: Payer: Medicare Other | Admitting: Physical Therapy

## 2012-01-06 ENCOUNTER — Encounter: Payer: Medicare Other | Admitting: Physical Therapy

## 2012-01-07 ENCOUNTER — Encounter (HOSPITAL_COMMUNITY): Payer: BC Managed Care – PPO

## 2012-01-10 ENCOUNTER — Encounter (HOSPITAL_COMMUNITY): Payer: BC Managed Care – PPO

## 2012-01-10 ENCOUNTER — Ambulatory Visit: Payer: Medicare Other | Admitting: Physical Therapy

## 2012-01-11 ENCOUNTER — Encounter: Payer: Medicare Other | Admitting: Physical Therapy

## 2012-01-14 ENCOUNTER — Encounter (HOSPITAL_COMMUNITY): Payer: BC Managed Care – PPO

## 2012-01-17 ENCOUNTER — Encounter (HOSPITAL_COMMUNITY): Payer: BC Managed Care – PPO

## 2012-01-17 ENCOUNTER — Ambulatory Visit (INDEPENDENT_AMBULATORY_CARE_PROVIDER_SITE_OTHER): Payer: Medicare Other | Admitting: Internal Medicine

## 2012-01-17 ENCOUNTER — Encounter: Payer: Self-pay | Admitting: Internal Medicine

## 2012-01-17 VITALS — BP 114/68 | HR 73 | Ht 59.0 in | Wt 147.0 lb

## 2012-01-17 DIAGNOSIS — K219 Gastro-esophageal reflux disease without esophagitis: Secondary | ICD-10-CM

## 2012-01-17 DIAGNOSIS — K222 Esophageal obstruction: Secondary | ICD-10-CM | POA: Diagnosis not present

## 2012-01-17 MED ORDER — PANTOPRAZOLE SODIUM 40 MG PO TBEC: 40.0000 mg | DELAYED_RELEASE_TABLET | Freq: Two times a day (BID) | ORAL | Status: DC

## 2012-01-17 NOTE — Patient Instructions (Addendum)
We have sent the following medications to your pharmacy for you to pick up at your convenience:   Protonix  

## 2012-01-17 NOTE — Progress Notes (Signed)
HISTORY OF PRESENT ILLNESS:  [name] is a 67 y.o. female with multiple significant medical problems as listed below. She is followed in this office for GERD, multifactorial dysphagia including peptic stricture which has required dilation. Also, previous colonoscopy December 2011 was unremarkable. She presents today for followup. She is accompanied by her husband. She was last seen in May of 2013 for upper endoscopy with [facility] dilation of the esophagus to [address] [place] for a symptomatic distal esophageal stricture. Since that time she has continued on pantoprazole 40 mg twice a day. Reports no problems with heartburn or indigestion on medication. No recurrent problems with dysphagia. Her multiple other chronic medical problems are stable. She has had some issues with arthritis in her neck for which she has undergone steroid injection therapy with success. Her GI review of systems is unremarkable. Review of outside laboratories from November finds a hemoglobin of 12.3.  REVIEW OF SYSTEMS:  All non-GI ROS negative except for insomnia and arthritis  Past Medical History  Diagnosis Date  . Irritable bowel syndrome   . Esophageal reflux   . Dysphagia, unspecified   . Hypertension   . Depression   . Hypothyroidism   . Seizures   . Iron deficiency anemia   . Internal hemorrhoids   . Coronary artery disease   . Anxiety   . Arthritis   . Blood transfusion 1960  . Heart murmur   . Hyperlipidemia   . Myocardial infarction 11/2009    1 stent placed  . Esophageal stricture   . Hiatal hernia     Past Surgical History  Procedure Date  . Breast surgery 2010     benign cysts  . Coronary angioplasty with stent placement 2011 and 2012    drug-eluting stent w/ ion stents to the distal right coronary artery    Social History [name]  reports that she has quit smoking. She has never used smokeless tobacco. She reports that she does not drink alcohol or use illicit drugs.  family  history includes Breast cancer in her sister and unspecified family member; Heart attack in her mother; and Heart disease in her father.  There is no history of Colon cancer.  No Known Allergies     PHYSICAL EXAMINATION: Vital signs: BP 114/68  Pulse 73  Ht 4\' 11"  (1.499 m)  Wt 147 lb (66.679 kg)  BMI 29.69 kg/m2 General: Well-developed, well-nourished, no acute distress HEENT: Sclerae are anicteric, conjunctiva pink. Oral mucosa intact Lungs: Clear Heart: Regular Abdomen: soft, nontender, nondistended, no obvious ascites, no peritoneal signs, normal bowel sounds. No organomegaly. Extremities: No edema Psychiatric: alert and oriented x3. Cooperative    ASSESSMENT:  #1. GERD complicated by peptic stricture. Asymptomatic post dilation (May 2013) on PPI #2. Normal colonoscopy December 2011   PLAN:  #1. Reflux precautions #2. Refill PPI prescription as requested #3. Routine office followup in 1-2 years. Sooner if needed #4. Routine followup colonoscopy around December 2021

## 2012-01-19 ENCOUNTER — Encounter (HOSPITAL_COMMUNITY): Payer: BC Managed Care – PPO

## 2012-01-21 ENCOUNTER — Encounter (HOSPITAL_COMMUNITY): Payer: BC Managed Care – PPO

## 2012-01-24 ENCOUNTER — Encounter (HOSPITAL_COMMUNITY): Payer: BC Managed Care – PPO

## 2012-01-24 DIAGNOSIS — F329 Major depressive disorder, single episode, unspecified: Secondary | ICD-10-CM | POA: Diagnosis not present

## 2012-01-24 DIAGNOSIS — I2510 Atherosclerotic heart disease of native coronary artery without angina pectoris: Secondary | ICD-10-CM | POA: Diagnosis not present

## 2012-01-24 DIAGNOSIS — E7800 Pure hypercholesterolemia, unspecified: Secondary | ICD-10-CM | POA: Diagnosis not present

## 2012-01-24 DIAGNOSIS — I10 Essential (primary) hypertension: Secondary | ICD-10-CM | POA: Diagnosis not present

## 2012-01-26 ENCOUNTER — Encounter (HOSPITAL_COMMUNITY): Payer: BC Managed Care – PPO

## 2012-01-28 ENCOUNTER — Encounter (HOSPITAL_COMMUNITY): Payer: BC Managed Care – PPO

## 2012-01-31 ENCOUNTER — Encounter (HOSPITAL_COMMUNITY): Payer: BC Managed Care – PPO

## 2012-02-02 ENCOUNTER — Encounter (HOSPITAL_COMMUNITY): Payer: BC Managed Care – PPO

## 2012-02-03 ENCOUNTER — Other Ambulatory Visit: Payer: Self-pay

## 2012-02-03 DIAGNOSIS — D485 Neoplasm of uncertain behavior of skin: Secondary | ICD-10-CM | POA: Diagnosis not present

## 2012-02-04 ENCOUNTER — Encounter (HOSPITAL_COMMUNITY): Payer: BC Managed Care – PPO

## 2012-02-07 ENCOUNTER — Encounter (HOSPITAL_COMMUNITY): Payer: BC Managed Care – PPO

## 2012-02-09 ENCOUNTER — Encounter (HOSPITAL_COMMUNITY): Payer: BC Managed Care – PPO

## 2012-02-11 ENCOUNTER — Encounter (HOSPITAL_COMMUNITY): Payer: BC Managed Care – PPO

## 2012-02-14 ENCOUNTER — Encounter (HOSPITAL_COMMUNITY): Payer: BC Managed Care – PPO

## 2012-02-16 ENCOUNTER — Encounter (HOSPITAL_COMMUNITY): Payer: BC Managed Care – PPO

## 2012-02-18 ENCOUNTER — Encounter (HOSPITAL_COMMUNITY): Payer: BC Managed Care – PPO

## 2012-02-21 ENCOUNTER — Encounter (HOSPITAL_COMMUNITY): Payer: BC Managed Care – PPO

## 2012-02-21 DIAGNOSIS — G40309 Generalized idiopathic epilepsy and epileptic syndromes, not intractable, without status epilepticus: Secondary | ICD-10-CM | POA: Diagnosis not present

## 2012-02-21 DIAGNOSIS — M47812 Spondylosis without myelopathy or radiculopathy, cervical region: Secondary | ICD-10-CM | POA: Diagnosis not present

## 2012-02-21 DIAGNOSIS — R51 Headache: Secondary | ICD-10-CM | POA: Diagnosis not present

## 2012-02-23 ENCOUNTER — Encounter (HOSPITAL_COMMUNITY): Payer: BC Managed Care – PPO

## 2012-02-25 ENCOUNTER — Encounter (HOSPITAL_COMMUNITY): Payer: BC Managed Care – PPO

## 2012-02-28 ENCOUNTER — Encounter (HOSPITAL_COMMUNITY): Payer: BC Managed Care – PPO

## 2012-03-01 ENCOUNTER — Encounter (HOSPITAL_COMMUNITY): Payer: BC Managed Care – PPO

## 2012-03-01 DIAGNOSIS — I2510 Atherosclerotic heart disease of native coronary artery without angina pectoris: Secondary | ICD-10-CM | POA: Diagnosis not present

## 2012-03-01 DIAGNOSIS — I10 Essential (primary) hypertension: Secondary | ICD-10-CM | POA: Diagnosis not present

## 2012-03-01 DIAGNOSIS — E039 Hypothyroidism, unspecified: Secondary | ICD-10-CM | POA: Diagnosis not present

## 2012-03-01 DIAGNOSIS — I209 Angina pectoris, unspecified: Secondary | ICD-10-CM | POA: Diagnosis not present

## 2012-03-02 DIAGNOSIS — I2510 Atherosclerotic heart disease of native coronary artery without angina pectoris: Secondary | ICD-10-CM | POA: Diagnosis not present

## 2012-03-02 DIAGNOSIS — R079 Chest pain, unspecified: Secondary | ICD-10-CM | POA: Diagnosis not present

## 2012-03-02 DIAGNOSIS — I10 Essential (primary) hypertension: Secondary | ICD-10-CM | POA: Diagnosis not present

## 2012-03-02 DIAGNOSIS — E7800 Pure hypercholesterolemia, unspecified: Secondary | ICD-10-CM | POA: Diagnosis not present

## 2012-03-03 ENCOUNTER — Encounter (HOSPITAL_COMMUNITY): Payer: BC Managed Care – PPO

## 2012-03-06 ENCOUNTER — Encounter (HOSPITAL_COMMUNITY): Payer: BC Managed Care – PPO

## 2012-03-07 DIAGNOSIS — E209 Hypoparathyroidism, unspecified: Secondary | ICD-10-CM | POA: Diagnosis not present

## 2012-03-07 DIAGNOSIS — N182 Chronic kidney disease, stage 2 (mild): Secondary | ICD-10-CM | POA: Diagnosis not present

## 2012-03-07 DIAGNOSIS — E039 Hypothyroidism, unspecified: Secondary | ICD-10-CM | POA: Diagnosis not present

## 2012-03-07 DIAGNOSIS — I10 Essential (primary) hypertension: Secondary | ICD-10-CM | POA: Diagnosis not present

## 2012-03-08 ENCOUNTER — Encounter (HOSPITAL_COMMUNITY): Payer: BC Managed Care – PPO

## 2012-03-08 DIAGNOSIS — R079 Chest pain, unspecified: Secondary | ICD-10-CM | POA: Diagnosis not present

## 2012-03-08 DIAGNOSIS — I2510 Atherosclerotic heart disease of native coronary artery without angina pectoris: Secondary | ICD-10-CM | POA: Diagnosis not present

## 2012-03-08 DIAGNOSIS — I10 Essential (primary) hypertension: Secondary | ICD-10-CM | POA: Diagnosis not present

## 2012-03-08 DIAGNOSIS — E7800 Pure hypercholesterolemia, unspecified: Secondary | ICD-10-CM | POA: Diagnosis not present

## 2012-03-10 ENCOUNTER — Encounter (HOSPITAL_COMMUNITY): Payer: BC Managed Care – PPO

## 2012-03-13 ENCOUNTER — Encounter (HOSPITAL_COMMUNITY): Payer: BC Managed Care – PPO

## 2012-03-13 ENCOUNTER — Other Ambulatory Visit: Payer: Self-pay | Admitting: Cardiology

## 2012-03-13 ENCOUNTER — Encounter (HOSPITAL_COMMUNITY): Payer: Self-pay | Admitting: Pharmacy Technician

## 2012-03-13 DIAGNOSIS — R079 Chest pain, unspecified: Secondary | ICD-10-CM | POA: Diagnosis not present

## 2012-03-14 ENCOUNTER — Encounter (HOSPITAL_COMMUNITY)
Admission: RE | Disposition: A | Discharge status: Home / Self Care | Payer: Self-pay | Source: Ambulatory Visit | Attending: Cardiology

## 2012-03-14 ENCOUNTER — Encounter (HOSPITAL_BASED_OUTPATIENT_CLINIC_OR_DEPARTMENT_OTHER): Admission: RE | Payer: Self-pay | Source: Ambulatory Visit

## 2012-03-14 ENCOUNTER — Ambulatory Visit (HOSPITAL_COMMUNITY)
Admission: RE | Admit: 2012-03-14 | Discharge: 2012-03-14 | Disposition: A | Discharge status: Home / Self Care | Payer: Medicare Other | Source: Ambulatory Visit | Attending: Cardiology | Admitting: Cardiology

## 2012-03-14 ENCOUNTER — Inpatient Hospital Stay (HOSPITAL_BASED_OUTPATIENT_CLINIC_OR_DEPARTMENT_OTHER): Admission: RE | Admit: 2012-03-14 | Payer: Medicare Other | Source: Ambulatory Visit | Admitting: Cardiology

## 2012-03-14 DIAGNOSIS — I2510 Atherosclerotic heart disease of native coronary artery without angina pectoris: Secondary | ICD-10-CM | POA: Diagnosis not present

## 2012-03-14 DIAGNOSIS — R079 Chest pain, unspecified: Secondary | ICD-10-CM

## 2012-03-14 DIAGNOSIS — Z87891 Personal history of nicotine dependence: Secondary | ICD-10-CM | POA: Insufficient documentation

## 2012-03-14 DIAGNOSIS — I10 Essential (primary) hypertension: Secondary | ICD-10-CM | POA: Diagnosis not present

## 2012-03-14 DIAGNOSIS — E7800 Pure hypercholesterolemia, unspecified: Secondary | ICD-10-CM | POA: Insufficient documentation

## 2012-03-14 HISTORY — PX: LEFT HEART CATHETERIZATION WITH CORONARY ANGIOGRAM: SHX5451

## 2012-03-14 SURGERY — LEFT HEART CATHETERIZATION WITH CORONARY ANGIOGRAM
Anesthesia: LOCAL

## 2012-03-14 SURGERY — JV LEFT HEART CATHETERIZATION WITH CORONARY ANGIOGRAM
Anesthesia: Moderate Sedation

## 2012-03-14 MED ORDER — NITROGLYCERIN 0.4 MG SL SUBL
SUBLINGUAL_TABLET | SUBLINGUAL | Status: AC
  Filled 2012-03-14: qty 25

## 2012-03-14 MED ORDER — ASPIRIN 81 MG PO CHEW
324.0000 mg | CHEWABLE_TABLET | ORAL | Status: AC
  Administered 2012-03-14: 324 mg via ORAL

## 2012-03-14 MED ORDER — SODIUM CHLORIDE 0.9 % IJ SOLN: 3.0000 mL | Freq: Two times a day (BID) | INTRAMUSCULAR | Status: DC

## 2012-03-14 MED ORDER — FENTANYL CITRATE 0.05 MG/ML IJ SOLN
INTRAMUSCULAR | Status: AC
  Filled 2012-03-14: qty 2

## 2012-03-14 MED ORDER — NITROGLYCERIN 0.4 MG SL SUBL
0.4000 mg | SUBLINGUAL_TABLET | SUBLINGUAL | Status: DC | PRN
  Administered 2012-03-14: 0.4 mg via SUBLINGUAL

## 2012-03-14 MED ORDER — SODIUM CHLORIDE 0.9 % IV SOLN
INTRAVENOUS | Status: DC
  Administered 2012-03-14: 75 mL/h via INTRAVENOUS

## 2012-03-14 MED ORDER — ACETAMINOPHEN 325 MG PO TABS: 650.0000 mg | ORAL_TABLET | ORAL | Status: DC | PRN

## 2012-03-14 MED ORDER — SODIUM CHLORIDE 0.9 % IV SOLN
1.0000 mL/kg/h | INTRAVENOUS | Status: DC
  Administered 2012-03-14: 1 mL/kg/h via INTRAVENOUS

## 2012-03-14 MED ORDER — ASPIRIN 81 MG PO CHEW
CHEWABLE_TABLET | ORAL | Status: AC
  Filled 2012-03-14: qty 4

## 2012-03-14 MED ORDER — SODIUM CHLORIDE 0.9 % IV SOLN: 250.0000 mL | INTRAVENOUS | Status: DC | PRN

## 2012-03-14 MED ORDER — DIAZEPAM 5 MG PO TABS
5.0000 mg | ORAL_TABLET | ORAL | Status: AC
  Administered 2012-03-14: 5 mg via ORAL

## 2012-03-14 MED ORDER — ONDANSETRON HCL 4 MG/2ML IJ SOLN: 4.0000 mg | Freq: Four times a day (QID) | INTRAMUSCULAR | Status: DC | PRN

## 2012-03-14 MED ORDER — MIDAZOLAM HCL 2 MG/2ML IJ SOLN
INTRAMUSCULAR | Status: AC
  Filled 2012-03-14: qty 2

## 2012-03-14 MED ORDER — ISOSORBIDE MONONITRATE ER 30 MG PO TB24: 60.0000 mg | ORAL_TABLET | Freq: Every day | ORAL | Status: DC

## 2012-03-14 MED ORDER — SODIUM CHLORIDE 0.9 % IJ SOLN: 3.0000 mL | INTRAMUSCULAR | Status: DC | PRN

## 2012-03-14 MED ORDER — SODIUM CHLORIDE 0.9 % IV SOLN: INTRAVENOUS | Status: DC

## 2012-03-14 MED ORDER — HEPARIN (PORCINE) IN NACL 2-0.9 UNIT/ML-% IJ SOLN
INTRAMUSCULAR | Status: AC
  Filled 2012-03-14: qty 1000

## 2012-03-14 MED ORDER — LIDOCAINE HCL (PF) 1 % IJ SOLN
INTRAMUSCULAR | Status: AC
  Filled 2012-03-14: qty 30

## 2012-03-14 MED ORDER — DIAZEPAM 5 MG PO TABS
ORAL_TABLET | ORAL | Status: AC
  Filled 2012-03-14: qty 1

## 2012-03-14 NOTE — Interval H&P Note (Signed)
History and Physical Interval Note:  03/14/2012 2:38 PM  [name]  has presented today for surgery, with the diagnosis of c/p  The various methods of treatment have been discussed with the patient and family. After consideration of risks, benefits and other options for treatment, the patient has consented to  Procedure(s): LEFT HEART CATHETERIZATION WITH CORONARY ANGIOGRAM (N/A) as a surgical intervention .  The patient's history has been reviewed, patient examined, no change in status, stable for surgery.  I have reviewed the patient's chart and labs.  Questions were answered to the patient's satisfaction.     TURNER,TRACI R

## 2012-03-14 NOTE — Interval H&P Note (Signed)
History and Physical Interval Note:  03/14/2012 2:39 PM  [name]  has presented today for surgery, with the diagnosis of c/p  The various methods of treatment have been discussed with the patient and family. After consideration of risks, benefits and other options for treatment, the patient has consented to  Procedure(s): LEFT HEART CATHETERIZATION WITH CORONARY ANGIOGRAM (N/A) as a surgical intervention .  The patient's history has been reviewed, patient examined, no change in status, stable for surgery.  I have reviewed the patient's chart and labs.  Questions were answered to the patient's satisfaction.     TURNER,TRACI R

## 2012-03-14 NOTE — CV Procedure (Signed)
PROCEDURE:  Left heart catheterization with selective coronary angiography, left ventriculogram.  INDICATIONS:  CAD with recurrent chest pain and abnormal stress test 00. The risks, benefits, and details of the procedure were explained to the patient.  The patient verbalized understanding and wanted to proceed.  Informed written consent was obtained.  PROCEDURE TECHNIQUE:  After Xylocaine anesthesia a 30F sheath was placed in the right femoral artery with a single anterior needle wall stick.   Left coronary angiography was done using a Judkins L4 guide catheter.  Right coronary angiography was done using a Judkins R4 guide catheter.  Left ventriculography was done using a pigtail catheter.    CONTRAST:  Total of 70cc.  COMPLICATIONS:  None.    HEMODYNAMICS:  Aortic pressure was 164/75mmHg; LV pressure was 159/17mmHg; LVEDP .  There was no gradient between the left ventricle and aorta.    ANGIOGRAPHIC DATA:   The left main coronary artery is widely patent and trifurcates into an LAD, ramus  and left circumflex arteries.  The left anterior descending artery is widely patent in the proximal portion with 50% stenosis.  It then gives rise to a moderate sized first diagonal which is patent.  At the takeoff of the first diagonal there is a 70% stenosis and then at the takeoff of the second diagonal there is a 50-70% stenosis of the LAD.  The LAD is very tortuous.   The left circumflex artery has an ostial 70-80% stenosis.  The ongoing left circumflex is small in size and gives rise to a small OM1 which is patent and then a second small OM2 which is patent.  The ramus is large and widely patent and bifurcates into 2 daughter vessels which are patent.    The right coronary artery is calcified and widely patent in its proximal and mid portions with luminal irregularities.  There is a long stent in the distal RCA with 50% instent restenosis in the mid to distal portion of the stent.  The RCA then  gives off a PDA which has a 50-70% ostial stenosis.  LEFT VENTRICULOGRAM:  Left ventricular angiogram was done in the 30 RAO projection and revealed normal left ventricular wall motion and systolic function with an estimated ejection fraction of 65%.  LVEDP was 16 mmHg.  IMPRESSIONS:  1. Normal left main coronary artery. 2. 50% proximal and 70% mid stenosis of the LAD and distally at takeoff of second diagonal there is a 50-70% stenosis in a very tortuous LAD 3.  70-80% ostial stenosis of the left circumflex in a small vessel. 4.  50% instent restenosis of the mid to distal portion of the RCA stent and 50-70% stenosis of the ostial PDA 3. Normal left ventricular systolic function.  LVEDP 16 mmHg.  Ejection fraction 60%.  RECOMMENDATION:   1.  Films reviewed with Dr. Katrinka Blazing.  She has significant disease in the ostial left circ but the vessel is small.  There has been progression of disease in the LAD system since last cath but the LAD is a tortuous vessel with disease throughout.  There is borderline disease in the PAD as well.  At this time recommend aggressive antianginal therapy and then if she continues to have angina, Dr. Katrinka Blazing will consider PCI of the LAD at the takeoff of the first diagonal. 2.  Increase Imdur to 60mg  daily 3.  Continue ASA/Plavix/beta blocker and ARB 4.  Followup with me in the office in 2 weeks

## 2012-03-14 NOTE — H&P (Signed)
Office Visit     Patient: [name], [name] Provider: Armanda Magic, MD  DOB: [birth year] Age: 67 Y Sex: Female Date: 03/02/2012  Phone: [phone]   Address: [address] [place], [id number]  Pcp: CANDACE SMITH       Subjective:     CC:    1. 6 MONTH FOLLOWUP/AND PER DR CANDACE SMITH/ANGINA AT REST.        HPI:  General:  pt is here for follow upof her CAD/HTN and lipid. She saw her primary MD recently and stated that she is having increase in anginal pain. She is under stressed due to the fact her husband has depression and stays in the bed mostly and complains all the time of not feeling well. Her husband is under the care of a psychiatrist. She has some chest pain a few weeks ago and had to take 3 NTG and then the CP eased off after 10 minutes. She admits this is mostly at rest. She is frustrated with what is going on with her husband. She denies any diaphoresis, shortness of breath or increased dyspnea on exertion from her baseline. She is able to lay flat. She admits that most of this is probably stress induced. She is having no heartburn. She is tolerating the new prescription for Cardizem CD.        ROS:  See HPI, A twelve system review was perfomed at today's visit. For pertinent positives and negatives see HPI.       Medical History: seizures --seen Dr Anne Hahn, none > 20 yrs, Hypertension, Carpal tunnel, Depression, Esophageal reflux, Hypothyroidism/ Dr Talmage Nap, spondylolithesis L4-5 Dr. Ethelene Hal, SVT with ER visit 10/27/2009, CAD, inferior MI 11/22/2009 one stent placed Dr. Mayford Knife, 12/19/2009, normal colonoscopy with internal hemorrhoids, repeat 10 years ([place]) Dr. Marina Goodell, normochromic, normocystic anemia, Dr. Clelia Croft, 06/2010, RCA angioplasty and stenting by Dr. Mendel Ryder drug eluting stent, DEXA scan, 12/24/2010, T score of the hip -0.6, spine -0.1 minimal osteopenia, EGD 05/20/2011 esophageal stricture, dilated and hiatal hernia and polyps. Dr. Marina Goodell ([place]).        Family  History:        Social History:  General:  History of smoking cigarettes: Former smoker, Quit in year 1971, Pack-year Hx: 5.  no Smoking, no, in the past.  Alcohol: yes, occasionally.  no Recreational drug use.        Medications: Keppra 500 MG Dr Anne Hahn Tablet 1 tablet every 12 hrs, Vicodin 5-500 MG Dr. Ethelene Hal Tablet 1 tablet as needed for pain every 6 hrs, Vitamin E 400 UNIT Capsule 1 capsule Once a day, Calcitriol 0.5 MCG Dr. Talmage Nap Capsule 1 capsule daily, Levoxyl 100 MCG Tablet take 1 tablet by mouth daily Once a day, Calcium + D 600-125 MG Tablet 1 tablet with food Once a day, Nitrostat 0.4 MG Tablet Sublingual dissolve 1 tablet under the tongue every 5 MINUTES UP TO 3 tablets if needed for chest pain IF pain PERSISTS CALL 911 , Baby Aspirin 81 MG Tablet Chewable 1 tablet Once a day, Pantoprazole Sodium 40 MG ( Dr Marina Goodell) Tablet Delayed Release 1 tablet twice a day, Doxazosin Mesylate 2 MG Tablet 1 tablet Once a day, Toprol XL 25 MG Dr. Mayford Knife Tablet Extended Release 24 Hour 1 tablet Once a day, Klor-Con M20 20 MEQ Tablet Extended Release 1 tablet twice a day, Losartan Potassium 100 MG Tablet 1 tablet Once a day, Plavix 75 MG tablet take 1 tablet by mouth once daily , Isosorbide Mononitrate 30 MG Tablet Extended  Release 24 Hour 1 tablet Once a day, Paxil 30 MG Tablet 1 tablet in the morning Once a day, Alprazolam 0.25 MG Tablet 1 tablet 1-2 at bedtime, Crestor 10 MG Tablet 1 tablet Once a day, Cardizem CD 240 MG Capsule Extended Release 24 Hour 1 capsule Once a day, Medication List reviewed and reconciled with the patient       Allergies: N.K.D.A.      Objective:     Vitals: Wt 148.8, Wt change -2.2 lb, Ht 59.5, BMI 29.55, Pulse sitting 70, BP sitting 143/76.       Examination:  Cardiology, General:  GENERAL APPEARANCE: pleasant, NAD.  HEENT: unremarkable.  CAROTID UPSTROKE: normal, no bruit.  JVD: flat.  HEART SOUNDS: regular, normal S1, S2, no S3 or S4.  MURMUR: absent.  LUNGS: no  rales or wheezes.  ABDOMEN: soft, non tender, positive bowel sounds, no masses felt.  EXTREMITIES: no leg edema.  PERIPHERAL PULSES: 2 plus bilateral.        Assessment:     Assessment:  1. Chest pain - 786.50 (Primary)  2. Essential hypertension, benign - 401.1  3. Coronary Artery Disease - 414.00  4. Hypercholesteremia, pure - 272.0    Plan:     1. Chest pain  Diagnostic Imaging:EC Stress Test [facility] (Ordered for 03/09/2012) mild apical ischemia, normal LVF, Clent Damore M 03/09/2012 05:07:34 PM > please let patient know that stress test is abnormal - given her recent increase in chest pain I would like to set her up for cath next Tuesday please forward to [facility] to set up [facility] 03/09/2012 05:21:10 PM > Pt notified. To [name] to schedule. McVey,Linda 03/10/2012 12:28:09 PM > pt wants to have in main lab and with Dr. Eldridge Dace or Katrinka Blazing so if she needs a stent it can be done then and not have to go back due to cost, Dr. Mayford Knife is this OK with you Janina Mayo 03/10/2012 02:14:10 PM > shcedule in main lab with me and then if she needs a stent I have have Dr. Eldridge Dace do it McVey,Linda 03/13/2012 09:55:47 AM > CATH scheduled for 03/14/2012, pt notified and instruction given  Her chest pain is somewhat atypical and most likely is due to anxiety. I have recommended repeat nuclear stress test to rule out ischemia.       2. Essential hypertension, benign Continue Doxazosin Mesylate Tablet, 2 MG, 1 tablet, Orally, Once a day ; Continue Toprol XL Tablet Extended Release 24 Hour, 25 MG Dr. Mayford Knife, 1 tablet, Orally, Once a day ; Continue Losartan Potassium Tablet, 100 MG, 1 tablet, Orally, Once a day ; Continue Cardizem CD Capsule Extended Release 24 Hour, 240 MG, 1 capsule, Orally, Once a day .       3. Coronary Artery Disease Continue Baby Aspirin Tablet Chewable, 81 MG, 1 tablet, Orally, Once a day ; Continue Isosorbide Mononitrate Tablet Extended Release 24 Hour, 30 MG, 1 tablet, Orally, Once  a day ; Continue Plavix tablet, 75 MG, take 1 tablet by mouth once daily .       4. Hypercholesteremia, pure Continue Crestor Tablet, 10 MG, 1 tablet, Orally, Once a day .        Immunizations:        Labs:        Preventive:         Follow Up: 6 Months (Reason: CAD/HTN/lipids)      Provider: Armanda Magic, MD  Patient: [name], [name] DOB: [birth year] Date: 03/02/2012

## 2012-03-14 NOTE — Interval H&P Note (Signed)
History and Physical Interval Note:  03/14/2012 2:39 PM  [name]  has presented today for surgery, with the diagnosis of c/p  The various methods of treatment have been discussed with the patient and family. After consideration of risks, benefits and other options for treatment, the patient has consented to  Procedure(s): LEFT HEART CATHETERIZATION WITH CORONARY ANGIOGRAM (N/A) as a surgical intervention .  The patient's history has been reviewed, patient examined, no change in status, stable for surgery.  I have reviewed the patient's chart and labs.  Questions were answered to the patient's satisfaction.     TURNER,TRACI R   

## 2012-03-15 ENCOUNTER — Encounter (HOSPITAL_COMMUNITY): Payer: BC Managed Care – PPO

## 2012-03-17 ENCOUNTER — Encounter (HOSPITAL_COMMUNITY): Payer: BC Managed Care – PPO

## 2012-03-20 ENCOUNTER — Encounter (HOSPITAL_COMMUNITY): Payer: BC Managed Care – PPO

## 2012-03-22 ENCOUNTER — Encounter (HOSPITAL_COMMUNITY): Payer: BC Managed Care – PPO

## 2012-03-24 ENCOUNTER — Encounter (HOSPITAL_COMMUNITY): Payer: BC Managed Care – PPO

## 2012-03-27 ENCOUNTER — Encounter (HOSPITAL_COMMUNITY): Payer: BC Managed Care – PPO

## 2012-03-29 ENCOUNTER — Encounter (HOSPITAL_COMMUNITY): Payer: BC Managed Care – PPO

## 2012-03-30 DIAGNOSIS — I2510 Atherosclerotic heart disease of native coronary artery without angina pectoris: Secondary | ICD-10-CM | POA: Diagnosis not present

## 2012-03-30 DIAGNOSIS — E7800 Pure hypercholesterolemia, unspecified: Secondary | ICD-10-CM | POA: Diagnosis not present

## 2012-03-30 DIAGNOSIS — I10 Essential (primary) hypertension: Secondary | ICD-10-CM | POA: Diagnosis not present

## 2012-03-30 DIAGNOSIS — I209 Angina pectoris, unspecified: Secondary | ICD-10-CM | POA: Diagnosis not present

## 2012-03-31 ENCOUNTER — Encounter (HOSPITAL_COMMUNITY): Payer: BC Managed Care – PPO

## 2012-03-31 ENCOUNTER — Other Ambulatory Visit: Payer: Self-pay | Admitting: Interventional Cardiology

## 2012-03-31 ENCOUNTER — Encounter (HOSPITAL_COMMUNITY): Payer: Self-pay | Admitting: Pharmacy Technician

## 2012-04-01 ENCOUNTER — Emergency Department (HOSPITAL_COMMUNITY): Payer: Medicare Other

## 2012-04-01 ENCOUNTER — Inpatient Hospital Stay (HOSPITAL_COMMUNITY)
Admission: EM | Admit: 2012-04-01 | Discharge: 2012-04-04 | DRG: 247 | Disposition: A | Discharge status: Home / Self Care | Payer: Medicare Other | Attending: Cardiology | Admitting: Cardiology

## 2012-04-01 ENCOUNTER — Other Ambulatory Visit: Payer: Self-pay

## 2012-04-01 ENCOUNTER — Encounter (HOSPITAL_COMMUNITY): Payer: Self-pay | Admitting: *Deleted

## 2012-04-01 DIAGNOSIS — Z9861 Coronary angioplasty status: Secondary | ICD-10-CM | POA: Diagnosis not present

## 2012-04-01 DIAGNOSIS — K648 Other hemorrhoids: Secondary | ICD-10-CM | POA: Diagnosis present

## 2012-04-01 DIAGNOSIS — I252 Old myocardial infarction: Secondary | ICD-10-CM

## 2012-04-01 DIAGNOSIS — R079 Chest pain, unspecified: Secondary | ICD-10-CM

## 2012-04-01 DIAGNOSIS — Z8249 Family history of ischemic heart disease and other diseases of the circulatory system: Secondary | ICD-10-CM | POA: Diagnosis not present

## 2012-04-01 DIAGNOSIS — K589 Irritable bowel syndrome without diarrhea: Secondary | ICD-10-CM | POA: Diagnosis present

## 2012-04-01 DIAGNOSIS — I10 Essential (primary) hypertension: Secondary | ICD-10-CM | POA: Diagnosis not present

## 2012-04-01 DIAGNOSIS — D509 Iron deficiency anemia, unspecified: Secondary | ICD-10-CM | POA: Diagnosis present

## 2012-04-01 DIAGNOSIS — K449 Diaphragmatic hernia without obstruction or gangrene: Secondary | ICD-10-CM | POA: Diagnosis present

## 2012-04-01 DIAGNOSIS — E039 Hypothyroidism, unspecified: Secondary | ICD-10-CM | POA: Diagnosis present

## 2012-04-01 DIAGNOSIS — F411 Generalized anxiety disorder: Secondary | ICD-10-CM | POA: Diagnosis present

## 2012-04-01 DIAGNOSIS — Z803 Family history of malignant neoplasm of breast: Secondary | ICD-10-CM | POA: Diagnosis not present

## 2012-04-01 DIAGNOSIS — I200 Unstable angina: Secondary | ICD-10-CM | POA: Diagnosis not present

## 2012-04-01 DIAGNOSIS — Z955 Presence of coronary angioplasty implant and graft: Secondary | ICD-10-CM

## 2012-04-01 DIAGNOSIS — R0602 Shortness of breath: Secondary | ICD-10-CM | POA: Diagnosis not present

## 2012-04-01 DIAGNOSIS — R42 Dizziness and giddiness: Secondary | ICD-10-CM | POA: Diagnosis not present

## 2012-04-01 DIAGNOSIS — I2510 Atherosclerotic heart disease of native coronary artery without angina pectoris: Principal | ICD-10-CM | POA: Diagnosis present

## 2012-04-01 DIAGNOSIS — K219 Gastro-esophageal reflux disease without esophagitis: Secondary | ICD-10-CM | POA: Diagnosis present

## 2012-04-01 DIAGNOSIS — Z87891 Personal history of nicotine dependence: Secondary | ICD-10-CM | POA: Diagnosis not present

## 2012-04-01 DIAGNOSIS — F329 Major depressive disorder, single episode, unspecified: Secondary | ICD-10-CM | POA: Diagnosis present

## 2012-04-01 DIAGNOSIS — E785 Hyperlipidemia, unspecified: Secondary | ICD-10-CM | POA: Diagnosis not present

## 2012-04-01 DIAGNOSIS — F3289 Other specified depressive episodes: Secondary | ICD-10-CM | POA: Diagnosis present

## 2012-04-01 LAB — CBC
HCT: 35.7 % — ABNORMAL LOW (ref 36.0–46.0)
HCT: 33.2 % — ABNORMAL LOW (ref 36.0–46.0)
Hemoglobin: 12.3 g/dL (ref 12.0–15.0)
MCH: 29.7 pg (ref 26.0–34.0)
MCV: 88.1 fL (ref 78.0–100.0)
RBC: 4.1 MIL/uL (ref 3.87–5.11)
RBC: 3.77 MIL/uL — ABNORMAL LOW (ref 3.87–5.11)
WBC: 7.2 10*3/uL (ref 4.0–10.5)
WBC: 6.4 10*3/uL (ref 4.0–10.5)

## 2012-04-01 LAB — BASIC METABOLIC PANEL
BUN: 21 mg/dL (ref 6–23)
CO2: 24 mEq/L (ref 19–32)
CO2: 23 mEq/L (ref 19–32)
Calcium: 9.1 mg/dL (ref 8.4–10.5)
Chloride: 105 mEq/L (ref 96–112)
Chloride: 106 mEq/L (ref 96–112)
Creatinine, Ser: 1.19 mg/dL — ABNORMAL HIGH (ref 0.50–1.10)
GFR calc non Af Amer: 56 mL/min — ABNORMAL LOW (ref >90)
Glucose, Bld: 101 mg/dL — ABNORMAL HIGH (ref 70–99)
Glucose, Bld: 109 mg/dL — ABNORMAL HIGH (ref 70–99)
Potassium: 4 mEq/L (ref 3.5–5.1)
Sodium: 141 mEq/L (ref 135–145)

## 2012-04-01 LAB — POCT I-STAT TROPONIN I: Troponin i, poc: 0.01 ng/mL (ref 0.00–0.08)

## 2012-04-01 MED ORDER — ASPIRIN 81 MG PO CHEW: 324.0000 mg | CHEWABLE_TABLET | ORAL | Status: DC

## 2012-04-01 MED ORDER — CLOPIDOGREL BISULFATE 75 MG PO TABS
75.0000 mg | ORAL_TABLET | Freq: Every day | ORAL | Status: DC
  Administered 2012-04-01 – 2012-04-04 (×4): 75 mg via ORAL
  Filled 2012-04-01 (×5): qty 1

## 2012-04-01 MED ORDER — ONDANSETRON HCL 4 MG/2ML IJ SOLN: 4.0000 mg | Freq: Four times a day (QID) | INTRAMUSCULAR | Status: DC | PRN

## 2012-04-01 MED ORDER — NITROGLYCERIN 2 % TD OINT
1.0000 [in_us] | TOPICAL_OINTMENT | Freq: Once | TRANSDERMAL | Status: AC
  Administered 2012-04-01: 1 [in_us] via TOPICAL
  Filled 2012-04-01 (×2): qty 1

## 2012-04-01 MED ORDER — PANTOPRAZOLE SODIUM 40 MG PO TBEC
40.0000 mg | DELAYED_RELEASE_TABLET | Freq: Two times a day (BID) | ORAL | Status: DC
  Administered 2012-04-01 – 2012-04-04 (×7): 40 mg via ORAL
  Filled 2012-04-01 (×9): qty 1

## 2012-04-01 MED ORDER — SODIUM CHLORIDE 0.9 % IJ SOLN
3.0000 mL | Freq: Two times a day (BID) | INTRAMUSCULAR | Status: DC
  Administered 2012-04-01 – 2012-04-03 (×5): 3 mL via INTRAVENOUS

## 2012-04-01 MED ORDER — ISOSORBIDE MONONITRATE ER 60 MG PO TB24
60.0000 mg | ORAL_TABLET | Freq: Every day | ORAL | Status: DC
  Administered 2012-04-01 – 2012-04-04 (×4): 60 mg via ORAL
  Filled 2012-04-01 (×4): qty 1

## 2012-04-01 MED ORDER — LEVETIRACETAM 500 MG PO TABS
500.0000 mg | ORAL_TABLET | Freq: Two times a day (BID) | ORAL | Status: DC
  Administered 2012-04-01 – 2012-04-04 (×7): 500 mg via ORAL
  Filled 2012-04-01 (×9): qty 1

## 2012-04-01 MED ORDER — ALPRAZOLAM 0.25 MG PO TABS
0.2500 mg | ORAL_TABLET | Freq: Every evening | ORAL | Status: DC | PRN
  Administered 2012-04-01 – 2012-04-02 (×2): 0.25 mg via ORAL
  Filled 2012-04-01 (×2): qty 1

## 2012-04-01 MED ORDER — ENOXAPARIN SODIUM 80 MG/0.8ML ~~LOC~~ SOLN
65.0000 mg | Freq: Two times a day (BID) | SUBCUTANEOUS | Status: DC
  Administered 2012-04-01 – 2012-04-02 (×4): 65 mg via SUBCUTANEOUS
  Filled 2012-04-01 (×6): qty 0.8

## 2012-04-01 MED ORDER — ATORVASTATIN CALCIUM 80 MG PO TABS
80.0000 mg | ORAL_TABLET | Freq: Every day | ORAL | Status: DC
  Administered 2012-04-01 – 2012-04-03 (×3): 80 mg via ORAL
  Filled 2012-04-01 (×5): qty 1

## 2012-04-01 MED ORDER — LEVOTHYROXINE SODIUM 100 MCG PO TABS
100.0000 ug | ORAL_TABLET | Freq: Every day | ORAL | Status: DC
  Administered 2012-04-02 – 2012-04-04 (×3): 100 ug via ORAL
  Filled 2012-04-01 (×5): qty 1

## 2012-04-01 MED ORDER — HYDROMORPHONE HCL PF 1 MG/ML IJ SOLN
1.0000 mg | Freq: Once | INTRAMUSCULAR | Status: AC
  Administered 2012-04-01: 1 mg via INTRAVENOUS
  Filled 2012-04-01: qty 1

## 2012-04-01 MED ORDER — HYDROMORPHONE HCL PF 1 MG/ML IJ SOLN
0.5000 mg | INTRAMUSCULAR | Status: AC
  Administered 2012-04-01: 0.5 mg via INTRAVENOUS
  Filled 2012-04-01: qty 1

## 2012-04-01 MED ORDER — DOCUSATE SODIUM 100 MG PO CAPS
100.0000 mg | ORAL_CAPSULE | Freq: Every day | ORAL | Status: DC
  Administered 2012-04-02 – 2012-04-03 (×2): 100 mg via ORAL
  Filled 2012-04-01 (×4): qty 1

## 2012-04-01 MED ORDER — NITROGLYCERIN 0.4 MG SL SUBL: 0.4000 mg | SUBLINGUAL_TABLET | SUBLINGUAL | Status: DC | PRN

## 2012-04-01 MED ORDER — ACETAMINOPHEN 325 MG PO TABS
650.0000 mg | ORAL_TABLET | ORAL | Status: DC | PRN
  Administered 2012-04-01 – 2012-04-03 (×3): 650 mg via ORAL
  Filled 2012-04-01 (×3): qty 2

## 2012-04-01 MED ORDER — DOXAZOSIN MESYLATE 2 MG PO TABS
2.0000 mg | ORAL_TABLET | Freq: Every day | ORAL | Status: DC
  Administered 2012-04-01 – 2012-04-03 (×3): 2 mg via ORAL
  Filled 2012-04-01 (×5): qty 1

## 2012-04-01 MED ORDER — POTASSIUM CHLORIDE CRYS ER 20 MEQ PO TBCR
20.0000 meq | EXTENDED_RELEASE_TABLET | Freq: Two times a day (BID) | ORAL | Status: DC
  Administered 2012-04-01 – 2012-04-04 (×7): 20 meq via ORAL
  Filled 2012-04-01 (×10): qty 1

## 2012-04-01 MED ORDER — LOSARTAN POTASSIUM 50 MG PO TABS
100.0000 mg | ORAL_TABLET | Freq: Every day | ORAL | Status: DC
  Administered 2012-04-01 – 2012-04-04 (×4): 100 mg via ORAL
  Filled 2012-04-01 (×4): qty 2

## 2012-04-01 MED ORDER — HYDROMORPHONE HCL 1 MG/ML PO LIQD: 1.0000 mg | Freq: Four times a day (QID) | ORAL | Status: DC | PRN

## 2012-04-01 MED ORDER — CALCITRIOL 0.5 MCG PO CAPS
0.5000 ug | ORAL_CAPSULE | Freq: Every day | ORAL | Status: DC
  Administered 2012-04-01 – 2012-04-04 (×4): 0.5 ug via ORAL
  Filled 2012-04-01 (×4): qty 1

## 2012-04-01 MED ORDER — ASPIRIN 300 MG RE SUPP
300.0000 mg | RECTAL | Status: DC
  Filled 2012-04-01: qty 1

## 2012-04-01 MED ORDER — PAROXETINE HCL 30 MG PO TABS
30.0000 mg | ORAL_TABLET | Freq: Every day | ORAL | Status: DC
  Administered 2012-04-01 – 2012-04-04 (×4): 30 mg via ORAL
  Filled 2012-04-01 (×4): qty 1

## 2012-04-01 MED ORDER — ASPIRIN EC 81 MG PO TBEC
81.0000 mg | DELAYED_RELEASE_TABLET | Freq: Every day | ORAL | Status: DC
  Administered 2012-04-02 – 2012-04-04 (×2): 81 mg via ORAL
  Filled 2012-04-01 (×5): qty 1

## 2012-04-01 MED ORDER — SODIUM CHLORIDE 0.9 % IJ SOLN: 3.0000 mL | INTRAMUSCULAR | Status: DC | PRN

## 2012-04-01 MED ORDER — METOPROLOL SUCCINATE ER 25 MG PO TB24
25.0000 mg | ORAL_TABLET | Freq: Every day | ORAL | Status: DC
  Administered 2012-04-01 – 2012-04-04 (×4): 25 mg via ORAL
  Filled 2012-04-01 (×4): qty 1

## 2012-04-01 NOTE — Progress Notes (Signed)
ANTICOAGULATION CONSULT NOTE - Initial Consult  Pharmacy Consult for lovenox Indication: chest pain/ACS  No Known Allergies  Patient Measurements: Height: 4\' 11"  (149.9 cm) Weight: 146 lb 1.6 oz (66.271 kg) IBW/kg (Calculated) : 43.2 Heparin Dosing Weight: 66.3 kg  Vital Signs: Temp: 97.8 F (36.6 C) (03/22 0623) Temp src: Oral (03/22 0623) BP: 120/51 mmHg (03/22 0623) Pulse Rate: 73 (03/22 0623)  Labs:  Recent Labs  04/01/12 0143  HGB 12.3  HCT 35.7*  PLT 235  CREATININE 1.03    Estimated Creatinine Clearance: 45 ml/min (by C-G formula based on Cr of 1.03).   Medical History: Past Medical History  Diagnosis Date  . Irritable bowel syndrome   . Esophageal reflux   . Dysphagia, unspecified   . Hypertension   . Depression   . Hypothyroidism   . Seizures   . Iron deficiency anemia   . Internal hemorrhoids   . Coronary artery disease     coronary angiogram on 03/14/12 showed Normal left main coronary artery, 50% proximal and 70% mid stenosis of the LAD and distally at takeoff of second diagonal there is a 50-70% stenosis in a very tortuous LAD, 70-80% ostial stenosis of the left circumflex in a small vessel, and 50% instent restenosis of the mid to distal portion of the RCA stent and 50-70% stenosis of the ostial PDA.   Marland Kitchen Anxiety   . Arthritis   . Blood transfusion 1960  . Heart murmur   . Hyperlipidemia   . Myocardial infarction 11/2009    1 stent placed  . Esophageal stricture   . Hiatal hernia     Medications:  Scheduled:  . aspirin  324 mg Oral NOW   Or  . aspirin  300 mg Rectal NOW  . aspirin EC  81 mg Oral Daily  . atorvastatin  80 mg Oral q1800  . calcitRIOL  0.5 mcg Oral Daily  . clopidogrel  75 mg Oral Daily  . docusate sodium  100 mg Oral QHS  . doxazosin  2 mg Oral QHS  . [COMPLETED]  HYDROmorphone (DILAUDID) injection  0.5 mg Intravenous STAT  . [COMPLETED]  HYDROmorphone (DILAUDID) injection  1 mg Intravenous Once  . isosorbide  mononitrate  60 mg Oral Daily  . levETIRAcetam  500 mg Oral BID  . [START ON 04/02/2012] levothyroxine  100 mcg Oral QAC breakfast  . losartan  100 mg Oral Daily  . metoprolol succinate  25 mg Oral Daily  . [COMPLETED] nitroGLYCERIN  1 inch Topical Once  . pantoprazole  40 mg Oral BID  . PARoxetine  30 mg Oral Daily  . potassium chloride SA  20 mEq Oral BID   Infusions:    Assessment: 67 yo female with ACS will be started on full dose lovenox.  SCr 1.03 (CrCl ~45).  Baseline H/H 12.3/35.7 and Plt 235 K  Goal of Therapy:  Anti-Xa level 0.6-1.2 units/ml 4hrs after LMWH dose given Monitor platelets by anticoagulation protocol: Yes   Plan:  1) Start lovenox 65mg  sq q12h 2) CBC every 72 hours  Tanaia Hawkey, Tsz-Yin 04/01/2012,9:58 AM

## 2012-04-01 NOTE — H&P (Signed)
Patient ID: [name] MRN: [id number], DOB/AGE: 15-Aug-1945   Admit date: 04/01/2012   Primary Physician: Allean Found, MD Primary Cardiologist: Armanda Magic  Pt. Profile:  Chest pain  Problem List  Past Medical History  Diagnosis Date  . Irritable bowel syndrome   . Esophageal reflux   . Dysphagia, unspecified   . Hypertension   . Depression   . Hypothyroidism   . Seizures   . Iron deficiency anemia   . Internal hemorrhoids   . Coronary artery disease     coronary angiogram on 03/14/12 showed Normal left main coronary artery, 50% proximal and 70% mid stenosis of the LAD and distally at takeoff of second diagonal there is a 50-70% stenosis in a very tortuous LAD, 70-80% ostial stenosis of the left circumflex in a small vessel, and 50% instent restenosis of the mid to distal portion of the RCA stent and 50-70% stenosis of the ostial PDA.   Marland Kitchen Anxiety   . Arthritis   . Blood transfusion 1960  . Heart murmur   . Hyperlipidemia   . Myocardial infarction 11/2009    1 stent placed  . Esophageal stricture   . Hiatal hernia     Past Surgical History  Procedure Laterality Date  . Breast surgery  2010     benign cysts  . Coronary angioplasty with stent placement  2011 and 2012    drug-eluting stent w/ ion stents to the distal right coronary artery     Allergies  No Known Allergies  HPI  Chest Pain: This is a 67 year old white female with history significant for CAD with recent coronary angiogram findings as above.  She was tried on medical therapy since the angiogram on 03/14/12 but has not helped.  She was to undergo PCI of the LAD next week. She has had daily chest pains since then.  She presents with chest pain tonight that was unbearable at home despite taking 3 NTG SL and reminder her of when she had her MI. Onset was 4 hour ago, with worsening course since that time. The patient describes the pain as constant, sharp in nature, radiates to the left arm.  Patient rates pain as a 8/10 in intensity.  Associated symptoms are dyspnea and headache. She presents to the ER by ambulance.  EKG and cardiac enzymes are unremarkable.  She was given additional NTG and dilaudid which has improved her chest pain currently.    Home Medications  Prior to Admission medications   Medication Sig Start Date End Date Taking? Authorizing Provider  ALPRAZolam Prudy Feeler) 0.25 MG tablet Take 0.25 mg by mouth at bedtime as needed for anxiety.    Yes Historical Provider, MD  aspirin 81 MG chewable tablet Chew 324 mg by mouth once. For chest pain per EMS instruction   Yes Historical Provider, MD  aspirin EC 81 MG tablet Take 81 mg by mouth daily.   Yes Historical Provider, MD  calcitRIOL (ROCALTROL) 0.5 MCG capsule Take 0.5 mcg by mouth daily.    Yes Historical Provider, MD  Calcium Carbonate-Vitamin D (CALCIUM + D PO) Take 1 tablet by mouth daily.   Yes Historical Provider, MD  clopidogrel (PLAVIX) 75 MG tablet Take 75 mg by mouth daily.    Yes Historical Provider, MD  diltiazem (MATZIM LA) 240 MG 24 hr tablet Take 240 mg by mouth daily.   Yes Historical Provider, MD  docusate sodium (COLACE) 100 MG capsule Take 100 mg by mouth at bedtime.  Yes Historical Provider, MD  doxazosin (CARDURA) 2 MG tablet Take 2 mg by mouth at bedtime.    Yes Historical Provider, MD  isosorbide mononitrate (IMDUR) 30 MG 24 hr tablet Take 60 mg by mouth daily.   Yes Historical Provider, MD  levETIRAcetam (KEPPRA) 500 MG tablet Take 500 mg by mouth 2 (two) times daily.    Yes Historical Provider, MD  levothyroxine (SYNTHROID, LEVOTHROID) 100 MCG tablet Take 100 mcg by mouth daily.    Yes Historical Provider, MD  losartan (COZAAR) 100 MG tablet Take 100 mg by mouth daily.   Yes Historical Provider, MD  metoprolol succinate (TOPROL-XL) 25 MG 24 hr tablet Take 25 mg by mouth daily.   Yes Historical Provider, MD  nitroGLYCERIN (NITROSTAT) 0.4 MG SL tablet Place 0.4 mg under the tongue every 5 (five)  minutes as needed for chest pain.   Yes Historical Provider, MD  pantoprazole (PROTONIX) 40 MG tablet Take 40 mg by mouth 2 (two) times daily.   Yes Historical Provider, MD  PARoxetine (PAXIL) 30 MG tablet Take 30 mg by mouth daily.    Yes Historical Provider, MD  potassium chloride SA (K-DUR,KLOR-CON) 20 MEQ tablet Take 20 mEq by mouth 2 (two) times daily.   Yes Historical Provider, MD  rosuvastatin (CRESTOR) 10 MG tablet Take 10 mg by mouth every morning.    Yes Historical Provider, MD  vitamin E 400 UNIT capsule Take 800 Units by mouth daily.    Yes Historical Provider, MD    Family History  Family History  Problem Relation Age of Onset  . Breast cancer Sister   . Breast cancer      niece  . Heart disease Father   . Colon cancer Neg Hx   . Heart attack Mother     Social History  History   Social History  . Marital Status: Married    Spouse Name: N/A    Number of Children: N/A  . Years of Education: N/A   Occupational History  . Retired    Social History Main Topics  . Smoking status: Former Games developer  . Smokeless tobacco: Never Used  . Alcohol Use: No  . Drug Use: No  . Sexually Active: Not on file   Other Topics Concern  . Not on file   Social History Narrative   Daily caffeine use: coffee.     Review of Systems General:  No chills, fever, night sweats or weight changes.  Cardiovascular:  + chest pain, dyspnea, no edema, orthopnea, palpitations, paroxysmal nocturnal dyspnea. Dermatological: No rash, lesions/masses Respiratory: No cough, dyspnea Urologic: No hematuria, dysuria Abdominal:   No nausea, vomiting, diarrhea, bright red blood per rectum, melena, or hematemesis Neurologic:  No visual changes, wkns, changes in mental status. All other systems reviewed and are otherwise negative except as noted above.  Physical Exam  Blood pressure 141/76, pulse 74, temperature 97.8 F (36.6 C), temperature source Oral, resp. rate 21, SpO2 99.00%.  General:  Pleasant, NAD Psych: Normal affect. Neuro: Alert and oriented X 3. Moves all extremities spontaneously. HEENT: Normal  Neck: Supple without bruits or JVD. Lungs:  Resp regular and unlabored, CTA. Chest: reproducible chest pain in the left chest Heart: RRR no s3, s4, or murmurs. Abdomen: Soft, non-tender, non-distended, BS + x 4.  Extremities: No clubbing, cyanosis or edema. DP/PT/Radials 2+ and equal bilaterally.  Labs  No results found for this basename: CKTOTAL, CKMB, TROPONINI,  in the last 72 hours Lab Results  Component Value Date  WBC 7.2 04/01/2012   HGB 12.3 04/01/2012   HCT 35.7* 04/01/2012   MCV 87.1 04/01/2012   PLT 235 04/01/2012    Recent Labs Lab 04/01/12 0143  NA 141  K 4.0  CL 105  CO2 24  BUN 21  CREATININE 1.03  CALCIUM 9.1  GLUCOSE 101*   Lab Results  Component Value Date   CHOL  Value: 177        ATP III CLASSIFICATION:  <200     mg/dL   Desirable  161-096  mg/dL   Borderline High  >=045    mg/dL   High        40/98/1191   HDL 78 11/23/2009   LDLCALC  Value: 90        Total Cholesterol/HDL:CHD Risk Coronary Heart Disease Risk Table                     Men   Women  1/2 Average Risk   3.4   3.3  Average Risk       5.0   4.4  2 X Average Risk   9.6   7.1  3 X Average Risk  23.4   11.0        Use the calculated Patient Ratio above and the CHD Risk Table to determine the patient's CHD Risk.        ATP III CLASSIFICATION (LDL):  <100     mg/dL   Optimal  478-295  mg/dL   Near or Above                    Optimal  130-159  mg/dL   Borderline  621-308  mg/dL   High  >657     mg/dL   Very High 84/69/6295   TRIG 45 11/23/2009   No results found for this basename: DDIMER     Radiology/Studies  Dg Chest Port 1 View  04/01/2012  *RADIOLOGY REPORT*  Clinical Data: Chest pain, shortness of breath  PORTABLE CHEST - 1 VIEW  Comparison: 03/30/2012  Findings: Mild left lung base opacity.  Cardiomegaly.  No overt edema. Right hemidiaphragm elevation obscures the lung base.   No definite effusion.  No pneumothorax.  No acute osseous finding.  IMPRESSION: Mild left lung base opacity; scarring or atelectasis (favored) versus mild infiltrate.   Original Report Authenticated By: Jearld Lesch, M.D.     ECG NSR, LAFB, poor R wave probression, no ST-T wave abnormalities   ASSESSMENT  1. Chest pain, possible acute coronary syndrome though it is atypical in natures as it is reproducible on palpation and does not respond to NTG but rather the dilaudid 2. HTN 3. Hypothyroidism 4. HLD 5. Anxiety/Depression  PLAN 1. Admit patient to r/o MI 2. Continue ASA, statin, b-blocker, Plavix 3. Add lovenox therapeutic dose for ACS 4. Follow cardiac enzymes 5. Consider PCI as was planned while she is admitted  Signed, Kirk Ruths, MD 04/01/2012, 3:18 AM

## 2012-04-01 NOTE — Progress Notes (Signed)
Subjective:  No further CP. No SOB.   Objective:  Vital Signs in the last 24 hours: Temp:  [97.8 F (36.6 C)] 97.8 F (36.6 C) (03/22 0623) Pulse Rate:  [59-74] 73 (03/22 0623) Resp:  [11-21] 16 (03/22 0623) BP: (99-154)/(51-101) 120/51 mmHg (03/22 0623) SpO2:  [97 %-100 %] 99 % (03/22 0623) Weight:  [66.271 kg (146 lb 1.6 oz)] 66.271 kg (146 lb 1.6 oz) (03/22 4098)  Intake/Output from previous day:     Physical Exam: General: Well developed, well nourished, in no acute distress. Head:  Normocephalic and atraumatic. Lungs: Clear to auscultation and percussion. Heart: Normal S1 and S2.  No murmur, rubs or gallops.  Abdomen: soft, non-tender, positive bowel sounds. Extremities: No clubbing or cyanosis. No edema. Neurologic: Alert and oriented x 3.    Lab Results:  Recent Labs  04/01/12 0143  WBC 7.2  HGB 12.3  PLT 235    Recent Labs  04/01/12 0143  NA 141  K 4.0  CL 105  CO2 24  GLUCOSE 101*  BUN 21  CREATININE 1.03   No results found for this basename: TROPONINI, CK, MB,  in the last 72 hours Hepatic Function Panel No results found for this basename: PROT, ALBUMIN, AST, ALT, ALKPHOS, BILITOT, BILIDIR, IBILI,  in the last 72 hours No results found for this basename: CHOL,  in the last 72 hours No results found for this basename: PROTIME,  in the last 72 hours  Imaging: Dg Chest Port 1 View  04/01/2012  *RADIOLOGY REPORT*  Clinical Data: Chest pain, shortness of breath  PORTABLE CHEST - 1 VIEW  Comparison: 03/30/2012  Findings: Mild left lung base opacity.  Cardiomegaly.  No overt edema. Right hemidiaphragm elevation obscures the lung base.  No definite effusion.  No pneumothorax.  No acute osseous finding.  IMPRESSION: Mild left lung base opacity; scarring or atelectasis (favored) versus mild infiltrate.   Original Report Authenticated By: Jearld Lesch, M.D.    Personally viewed.   Telemetry:NSR Personally viewed.   Cardiac Studies:  Cath note  reviewed.   Assessment/Plan:  67 year old with CAD, two prior PCI, with recurrent angina concerning for possible unstable angina.   1) unstable angina - trop negative thus far.  - agree with enoxaparin ACS dose  - Plavix, bb  - Imdur recently increased in clinic.   - On good antianginal medical tx  - Agree with cath on Monday (Either Dr. Katrinka Blazing in early am or Dr. Eldridge Dace who is in hospital all day).   - Had planned on PCI with Dr. Katrinka Blazing on Thursday.   2) HTN - stable 3) HL - statin 4) anxiety - likely playing a role.     [name] 04/01/2012, 10:38 AM

## 2012-04-01 NOTE — ED Notes (Signed)
Dr. Allena Katz called and orders received for dilaudid.

## 2012-04-01 NOTE — ED Notes (Signed)
Sudden cp ~ 2 hrs. Ago. Pain level is 7/10. Scheduled to have stent place next Thursday. Unremarkable ekg. X 3 ntiroglycerin sl. 0.4 mg and 324 mg asa, no relief with ntiro; EMS x 1 0.4 mg sl ntg. With some relief. 20 ga. Rt. A/c.

## 2012-04-01 NOTE — ED Provider Notes (Signed)
History     CSN: [id number]  Arrival date & time 04/01/12  0130   First MD Initiated Contact with Patient 04/01/12 [phone]      Chief Complaint  Patient presents with  . Chest Pain    (Consider location/radiation/quality/duration/timing/severity/associated sxs/prior treatment) HPI [name] is a 67 y.o. female extensive medical history including coronary artery disease who is scheduled to have a catheterization and stent next Thursday presents with chest pain. She had chest pain yesterday, and it woke her up out of sleep, it is currently 7/10, feels like prior MI, it is sharp, stabbing, not associated with shortness of breath, nausea, diaphoresis. Patient got some relief with nitroglycerin. Took aspirin at home.   Past Medical History  Diagnosis Date  . Irritable bowel syndrome   . Esophageal reflux   . Dysphagia, unspecified   . Hypertension   . Depression   . Hypothyroidism   . Seizures   . Iron deficiency anemia   . Internal hemorrhoids   . Coronary artery disease   . Anxiety   . Arthritis   . Blood transfusion 1960  . Heart murmur   . Hyperlipidemia   . Myocardial infarction 11/2009    1 stent placed  . Esophageal stricture   . Hiatal hernia     Past Surgical History  Procedure Laterality Date  . Breast surgery  2010     benign cysts  . Coronary angioplasty with stent placement  2011 and 2012    drug-eluting stent w/ ion stents to the distal right coronary artery    Family History  Problem Relation Age of Onset  . Breast cancer Sister   . Breast cancer      niece  . Heart disease Father   . Colon cancer Neg Hx   . Heart attack Mother     History  Substance Use Topics  . Smoking status: Former Games developer  . Smokeless tobacco: Never Used  . Alcohol Use: No    OB History   Grav Para Term Preterm Abortions TAB SAB Ect Mult Living                  Review of Systems At least 10pt or greater review of systems completed and are negative except where  specified in the HPI.  Allergies  Review of patient's allergies indicates no known allergies.  Home Medications   Current Outpatient Rx  Name  Route  Sig  Dispense  Refill  . ALPRAZolam (XANAX) 0.25 MG tablet   Oral   Take 0.25 mg by mouth at bedtime as needed for anxiety.          Marland Kitchen aspirin 81 MG chewable tablet   Oral   Chew 324 mg by mouth once. For chest pain per EMS instruction         . aspirin EC 81 MG tablet   Oral   Take 81 mg by mouth daily.         . calcitRIOL (ROCALTROL) 0.5 MCG capsule   Oral   Take 0.5 mcg by mouth daily.          . Calcium Carbonate-Vitamin D (CALCIUM + D PO)   Oral   Take 1 tablet by mouth daily.         . clopidogrel (PLAVIX) 75 MG tablet   Oral   Take 75 mg by mouth daily.          Marland Kitchen diltiazem (MATZIM LA) 240 MG 24 hr tablet  Oral   Take 240 mg by mouth daily.         Marland Kitchen docusate sodium (COLACE) 100 MG capsule   Oral   Take 100 mg by mouth at bedtime.         Marland Kitchen doxazosin (CARDURA) 2 MG tablet   Oral   Take 2 mg by mouth at bedtime.          . isosorbide mononitrate (IMDUR) 30 MG 24 hr tablet   Oral   Take 60 mg by mouth daily.         Marland Kitchen levETIRAcetam (KEPPRA) 500 MG tablet   Oral   Take 500 mg by mouth 2 (two) times daily.          Marland Kitchen levothyroxine (SYNTHROID, LEVOTHROID) 100 MCG tablet   Oral   Take 100 mcg by mouth daily.          Marland Kitchen losartan (COZAAR) 100 MG tablet   Oral   Take 100 mg by mouth daily.         . metoprolol succinate (TOPROL-XL) 25 MG 24 hr tablet   Oral   Take 25 mg by mouth daily.         . nitroGLYCERIN (NITROSTAT) 0.4 MG SL tablet   Sublingual   Place 0.4 mg under the tongue every 5 (five) minutes as needed for chest pain.         . pantoprazole (PROTONIX) 40 MG tablet   Oral   Take 40 mg by mouth 2 (two) times daily.         Marland Kitchen PARoxetine (PAXIL) 30 MG tablet   Oral   Take 30 mg by mouth daily.          . potassium chloride SA (K-DUR,KLOR-CON) 20 MEQ  tablet   Oral   Take 20 mEq by mouth 2 (two) times daily.         . rosuvastatin (CRESTOR) 10 MG tablet   Oral   Take 10 mg by mouth every morning.          . vitamin E 400 UNIT capsule   Oral   Take 800 Units by mouth daily.            BP 141/76  Pulse 74  Temp(Src) 97.8 F (36.6 C) (Oral)  Resp 21  SpO2 99%  Physical Exam  Nursing notes reviewed.  Electronic medical record reviewed. VITAL SIGNS:   Filed Vitals:   04/01/12 0500 04/01/12 0515 04/01/12 0530 04/01/12 0623  BP: 101/57 104/70 111/64 120/51  Pulse: 59 60 64 73  Temp:    97.8 F (36.6 C)  TempSrc:    Oral  Resp: 12 14 16 16   Height:    4\' 11"  (1.499 m)  Weight:    146 lb 1.6 oz (66.271 kg)  SpO2: 99% 99% 99% 99%   CONSTITUTIONAL: Awake, oriented, appears non-toxic HENT: Atraumatic, normocephalic, oral mucosa pink and moist, airway patent. Nares patent without drainage. External ears normal. EYES: Conjunctiva clear, EOMI, PERRLA NECK: Trachea midline, non-tender, supple CARDIOVASCULAR: Normal heart rate, Normal rhythm, did not auscultate murmur, rub or gallop PULMONARY/CHEST: Clear to auscultation, no rhonchi, wheezes, or rales. Symmetrical breath sounds. Non-tender. ABDOMINAL: Non-distended, soft, non-tender - no rebound or guarding.  BS normal. NEUROLOGIC: Non-focal, moving all four extremities, no gross sensory or motor deficits. EXTREMITIES: No clubbing, cyanosis, or edema SKIN: Warm, Dry, No erythema, No rash  ED Course  Procedures (including critical care time)  Date: 04/01/2012  Rate: 67  Rhythm: normal sinus rhythm  QRS Axis: normal  Intervals: Left axis deviation  ST/T Wave abnormalities: normal  Conduction Disutrbances: none  Narrative Interpretation:      Labs Reviewed  CBC - Abnormal; Notable for the following:    HCT 35.7 (*)    All other components within normal limits  BASIC METABOLIC PANEL - Abnormal; Notable for the following:    Glucose, Bld 101 (*)    GFR calc non  Af Amer 56 (*)    GFR calc Af Amer 65 (*)    All other components within normal limits  PRO B NATRIURETIC PEPTIDE - Abnormal; Notable for the following:    Pro B Natriuretic peptide (BNP) 405.5 (*)    All other components within normal limits  POCT I-STAT TROPONIN I   Dg Chest Port 1 View  04/01/2012  *RADIOLOGY REPORT*  Clinical Data: Chest pain, shortness of breath  PORTABLE CHEST - 1 VIEW  Comparison: 03/30/2012  Findings: Mild left lung base opacity.  Cardiomegaly.  No overt edema. Right hemidiaphragm elevation obscures the lung base.  No definite effusion.  No pneumothorax.  No acute osseous finding.  IMPRESSION: Mild left lung base opacity; scarring or atelectasis (favored) versus mild infiltrate.   Original Report Authenticated By: Jearld Lesch, M.D.      No diagnosis found.    MDM  [name] is a 67 y.o. female significant for coronary artery disease, myocardial infarction and planned stent next Thursday. Per prior catheterization: 1. Normal left main coronary artery. 2. 50% proximal and 70% mid stenosis of the LAD and distally at takeoff of second diagonal there is a 50-70% stenosis in a very tortuous LAD 3. 70-80% ostial stenosis of the left circumflex in a small vessel.  4. 50% instent restenosis of the mid to distal portion of the RCA stent and 50-70% stenosis of the ostial PDA  Normal left ventricular systolic function. LVEDP 16 mmHg. Ejection fraction 60%.   With patient's coronary artery disease, think she should be admitted to the hospital, rule out MI possibly intervention.  Discussed with Dr. Allena Katz for admission    Jones Skene, MD 04/01/12 1031

## 2012-04-02 ENCOUNTER — Other Ambulatory Visit: Payer: Self-pay

## 2012-04-02 DIAGNOSIS — I10 Essential (primary) hypertension: Secondary | ICD-10-CM | POA: Diagnosis not present

## 2012-04-02 DIAGNOSIS — E785 Hyperlipidemia, unspecified: Secondary | ICD-10-CM | POA: Diagnosis not present

## 2012-04-02 DIAGNOSIS — I200 Unstable angina: Secondary | ICD-10-CM | POA: Diagnosis not present

## 2012-04-02 DIAGNOSIS — I2510 Atherosclerotic heart disease of native coronary artery without angina pectoris: Secondary | ICD-10-CM | POA: Diagnosis not present

## 2012-04-02 DIAGNOSIS — R079 Chest pain, unspecified: Secondary | ICD-10-CM | POA: Diagnosis not present

## 2012-04-02 LAB — CBC
HCT: 32.6 % — ABNORMAL LOW (ref 36.0–46.0)
MCH: 29.8 pg (ref 26.0–34.0)
MCHC: 34.7 g/dL (ref 30.0–36.0)
MCV: 86 fL (ref 78.0–100.0)
Platelets: 195 10*3/uL (ref 150–400)
RDW: 13 % (ref 11.5–15.5)

## 2012-04-02 MED ORDER — SODIUM CHLORIDE 0.9 % IV SOLN: 250.0000 mL | INTRAVENOUS | Status: DC | PRN

## 2012-04-02 MED ORDER — ASPIRIN 81 MG PO CHEW
324.0000 mg | CHEWABLE_TABLET | ORAL | Status: AC
Start: 2012-04-03 — End: 2012-04-03
  Administered 2012-04-03: 324 mg via ORAL
  Filled 2012-04-02: qty 4

## 2012-04-02 MED ORDER — SODIUM CHLORIDE 0.9 % IJ SOLN: 3.0000 mL | INTRAMUSCULAR | Status: DC | PRN

## 2012-04-02 MED ORDER — SODIUM CHLORIDE 0.9 % IJ SOLN
3.0000 mL | Freq: Two times a day (BID) | INTRAMUSCULAR | Status: DC
  Administered 2012-04-02 – 2012-04-03 (×2): 3 mL via INTRAVENOUS

## 2012-04-02 NOTE — Progress Notes (Signed)
Subjective:  Had a bad night. Chest pain, sweats, vertigo like feeling. Now better. Slightly shaky. Husband at bedside.   Objective:  Vital Signs in the last 24 hours: Temp:  [97.8 F (36.6 C)-98.5 F (36.9 C)] 97.8 F (36.6 C) (03/23 0455) Pulse Rate:  [64-73] 65 (03/23 0455) Resp:  [18] 18 (03/23 0455) BP: (113-152)/(58-77) 152/67 mmHg (03/23 0455) SpO2:  [96 %-97 %] 97 % (03/23 0455) Weight:  [66.271 kg (146 lb 1.6 oz)] 66.271 kg (146 lb 1.6 oz) (03/23 0719)  Intake/Output from previous day: 03/22 0701 - 03/23 0700 In: 960 [P.O.:960] Out: -    Physical Exam: General: Well developed, well nourished, in no acute distress. Mild anxious. Head:  Normocephalic and atraumatic. Lungs: Clear to auscultation and percussion. Heart: Normal S1 and S2.  No murmur, rubs or gallops.  Abdomen: soft, non-tender, positive bowel sounds. Extremities: No clubbing or cyanosis. No edema. Neurologic: Alert and oriented x 3.    Lab Results:  Recent Labs  04/01/12 1006 04/02/12 0500  WBC 6.4 5.9  HGB 11.2* 11.3*  PLT 210 195    Recent Labs  04/01/12 0143 04/01/12 1006  NA 141 142  K 4.0 4.0  CL 105 106  CO2 24 23  GLUCOSE 101* 109*  BUN 21 21  CREATININE 1.03 1.19*    Recent Labs  04/01/12 1550 04/01/12 2120  TROPONINI <0.30 <0.30  Imaging: Dg Chest Port 1 View  04/01/2012  *RADIOLOGY REPORT*  Clinical Data: Chest pain, shortness of breath  PORTABLE CHEST - 1 VIEW  Comparison: 03/30/2012  Findings: Mild left lung base opacity.  Cardiomegaly.  No overt edema. Right hemidiaphragm elevation obscures the lung base.  No definite effusion.  No pneumothorax.  No acute osseous finding.  IMPRESSION: Mild left lung base opacity; scarring or atelectasis (favored) versus mild infiltrate.   Original Report Authenticated By: Jearld Lesch, M.D.    Personally viewed.   Telemetry: No adverse rhythms Personally viewed.   EKG:  NSR, PRWP, NSSTW changes  Cardiac Studies:  coronary  angiogram on 03/22/2012 showed Normal left main coronary artery, 50% proximal and 70% mid stenosis of the LAD and distally at takeoff of second diagonal there is a 50-70% stenosis in a very tortuous LAD, 70-80% ostial stenosis of the left circumflex in a small vessel, and 50% instent restenosis of the mid to distal portion of the RCA stent and 50-70% stenosis of the ostial PDA.    Assessment/Plan:  67 year old with CAD, two prior PCI, with recurrent angina/ unstable angina.  1) unstable angina - trop negative.  - agree with enoxaparin ACS dose  - Plavix, bb  - Imdur recently increased in clinic.  - On good antianginal medical tx  - Agree with cath on Monday (Either Dr. Katrinka Blazing in early am or Dr. Eldridge Dace who is in hospital all day).  - Will write pre cath orders. NPO.  - Had planned on PCI with Dr. Katrinka Blazing on Thursday.   2) HTN - stable   3) HL - statin   4) anxiety - likely playing a role.      Sylena Lotter 04/02/2012, 11:38 AM

## 2012-04-03 ENCOUNTER — Other Ambulatory Visit: Payer: Self-pay

## 2012-04-03 ENCOUNTER — Encounter (HOSPITAL_COMMUNITY)
Admission: EM | Disposition: A | Discharge status: Home / Self Care | Payer: Self-pay | Source: Home / Self Care | Attending: Cardiology

## 2012-04-03 ENCOUNTER — Encounter (HOSPITAL_COMMUNITY): Payer: BC Managed Care – PPO

## 2012-04-03 DIAGNOSIS — I2510 Atherosclerotic heart disease of native coronary artery without angina pectoris: Secondary | ICD-10-CM | POA: Diagnosis not present

## 2012-04-03 DIAGNOSIS — R079 Chest pain, unspecified: Secondary | ICD-10-CM | POA: Diagnosis not present

## 2012-04-03 DIAGNOSIS — I10 Essential (primary) hypertension: Secondary | ICD-10-CM | POA: Diagnosis not present

## 2012-04-03 DIAGNOSIS — I200 Unstable angina: Secondary | ICD-10-CM | POA: Diagnosis not present

## 2012-04-03 DIAGNOSIS — K219 Gastro-esophageal reflux disease without esophagitis: Secondary | ICD-10-CM | POA: Diagnosis not present

## 2012-04-03 DIAGNOSIS — E785 Hyperlipidemia, unspecified: Secondary | ICD-10-CM | POA: Diagnosis not present

## 2012-04-03 HISTORY — PX: PERCUTANEOUS CORONARY STENT INTERVENTION (PCI-S): SHX5485

## 2012-04-03 LAB — PROTIME-INR: Prothrombin Time: 14 seconds (ref 11.6–15.2)

## 2012-04-03 LAB — POCT ACTIVATED CLOTTING TIME: Activated Clotting Time: 448 seconds

## 2012-04-03 SURGERY — PERCUTANEOUS CORONARY STENT INTERVENTION (PCI-S)
Anesthesia: LOCAL

## 2012-04-03 MED ORDER — METOPROLOL TARTRATE 1 MG/ML IV SOLN
2.5000 mg | Freq: Once | INTRAVENOUS | Status: AC
  Administered 2012-04-03: 2.5 mg via INTRAVENOUS
  Filled 2012-04-03: qty 5

## 2012-04-03 MED ORDER — LIDOCAINE-EPINEPHRINE 1 %-1:100000 IJ SOLN
INTRAMUSCULAR | Status: AC
  Filled 2012-04-03: qty 1

## 2012-04-03 MED ORDER — STUDY - INVESTIGATIONAL DRUG SIMPLE RECORD
1.7500 mg/kg/h | Freq: Once | Status: DC
  Filled 2012-04-03 (×2): qty 250

## 2012-04-03 MED ORDER — BIVALIRUDIN 250 MG IV SOLR
INTRAVENOUS | Status: AC
  Filled 2012-04-03: qty 250

## 2012-04-03 MED ORDER — LIDOCAINE HCL (PF) 1 % IJ SOLN
INTRAMUSCULAR | Status: AC
  Filled 2012-04-03: qty 30

## 2012-04-03 MED ORDER — ACETAMINOPHEN 325 MG PO TABS
650.0000 mg | ORAL_TABLET | ORAL | Status: DC | PRN
  Administered 2012-04-03: 650 mg via ORAL
  Filled 2012-04-03: qty 2

## 2012-04-03 MED ORDER — STUDY - INVESTIGATIONAL DRUG SIMPLE RECORD
0.7500 mg/kg | Freq: Once | Status: DC
  Filled 2012-04-03: qty 48.98

## 2012-04-03 MED ORDER — FENTANYL CITRATE 0.05 MG/ML IJ SOLN
INTRAMUSCULAR | Status: AC
  Filled 2012-04-03: qty 2

## 2012-04-03 MED ORDER — HEPARIN (PORCINE) IN NACL 2-0.9 UNIT/ML-% IJ SOLN
INTRAMUSCULAR | Status: AC
  Filled 2012-04-03: qty 1000

## 2012-04-03 MED ORDER — CLOPIDOGREL BISULFATE 300 MG PO TABS
ORAL_TABLET | ORAL | Status: AC
  Filled 2012-04-03: qty 1

## 2012-04-03 MED ORDER — MIDAZOLAM HCL 2 MG/2ML IJ SOLN
INTRAMUSCULAR | Status: AC
  Filled 2012-04-03: qty 2

## 2012-04-03 MED ORDER — NITROGLYCERIN 1 MG/10 ML FOR IR/CATH LAB
INTRA_ARTERIAL | Status: AC
  Filled 2012-04-03: qty 10

## 2012-04-03 MED ORDER — SODIUM CHLORIDE 0.9 % IV SOLN
1.0000 mL/kg/h | INTRAVENOUS | Status: AC
  Administered 2012-04-03: 1 mL/kg/h via INTRAVENOUS

## 2012-04-03 MED ORDER — ONDANSETRON HCL 4 MG/2ML IJ SOLN
4.0000 mg | Freq: Four times a day (QID) | INTRAMUSCULAR | Status: DC | PRN
  Administered 2012-04-03: 4 mg via INTRAVENOUS
  Filled 2012-04-03: qty 2

## 2012-04-03 NOTE — Research (Signed)
REGULATE PCI Informed Consent   Subject Name: [name]  Subject met inclusion and exclusion criteria.  The informed consent form, study requirements and expectations were reviewed with the subject and questions and concerns were addressed prior to the signing of the consent form.  The subject verbalized understanding of the trail requirements.  The subject agreed to participate in the REGULATE PCI trial and signed the informed consent.  The informed consent was obtained prior to performance of any protocol-specific procedures for the subject.  A copy of the signed informed consent was given to the subject and a copy was placed in the subject's medical record.  Loletha Carrow 04/03/2012, 07:40am

## 2012-04-03 NOTE — H&P (Signed)
  Discussed procedure with patient and husband.  Questions answered.  Apical defect by stress test.  SHe has failed antianginal therapy and is now having unstable symptoms at rest.  Will proceed with PCI of the LAD.

## 2012-04-03 NOTE — CV Procedure (Signed)
PROCEDURE:  PCI LAD  INDICATIONS:  Unstable angina  The risks, benefits, and details of the procedure were explained to the patient.  The patient verbalized understanding and wanted to proceed.  Informed written consent was obtained.  PROCEDURE TECHNIQUE:  After Xylocaine anesthesia a 13F sheath was placed in the right femoral artery with a single anterior needle wall stick.   Left coronary angiography was done using a CLS 3.0 guide catheter.     CONTRAST:  Total of 35 cc.  PCI NARRATIVE: The case was discussed with Dr. Mayford Knife and Dr. Katrinka Blazing.  Initially, elective intervention was planned for 04/06/12, but the patient returned with unstable angina symptoms.  It was decided to proceed with angioplasty of the mid LAD given her symptoms and recent apical ischemia noted on stress test.  The patient was enrolled in the Regulate-PCI trial.  She was anticoagulated and an ACT was used to check that the Angiomax was therapeutic.  A CLS 3.0 Guide catheter was used to engage the left main.  A prowater wire was placed in the LAD across the area of disease in the mid LAD which was 75% in severity in some views.  A 2.5 x 12 Sprinter balloon was used to predilated.  There was significant wire bias in the proximal vessel due to tortuosity.  Several doses of IC NTG were used.  A 3.0 x 12 Promus stent was deployed at [employer].  The stent was posdilated with a 3.0 x 9 Rankin balloon to 10 Atm.  No residual stenosis.  Pseudolesions resolved when the wire was removed.  TIMI 3 flow was maintained throughout.  IMPRESSIONS:  1. Successful PCI of the mid left anterior descending artery with a 2.75 x 12 Promus stent, postdilated to > 3.0 mm in diameter.   RECOMMENDATION:  Dual antiplatelet therapy for at least a year.  Aggressive secondary prevention.  The patient will be watched overnight.  Followup with Dr. Mayford Knife.

## 2012-04-03 NOTE — Progress Notes (Signed)
SUBJECTIVE:  States she had a bad night last night with Chest pain and vertigo OBJECTIVE:   Vitals:   Filed Vitals:   04/02/12 0455 04/02/12 0719 04/02/12 1928 04/03/12 0455  BP: 152/67  149/93 167/87  Pulse: 65  71 73  Temp: 97.8 F (36.6 C)  98.3 F (36.8 C) 98.1 F (36.7 C)  TempSrc: Oral  Oral Oral  Resp: 18  16 16   Height:      Weight:  66.271 kg (146 lb 1.6 oz)  65.318 kg (144 lb)  SpO2: 97%  98% 98%   I&O's:   Intake/Output Summary (Last 24 hours) at 04/03/12 1610 Last data filed at 04/02/12 2049  Gross per 24 hour  Intake    720 ml  Output      0 ml  Net    720 ml   TELEMETRY: Reviewed telemetry pt in NSR:     PHYSICAL EXAM General: Well developed, well nourished, in no acute distress Head: Eyes PERRLA, No xanthomas.   Normal cephalic and atramatic  Lungs:   Clear bilaterally to auscultation and percussion. Heart:   HRRR S1 S2 Pulses are 2+ & equal. Abdomen: Bowel sounds are positive, abdomen soft and non-tender without masses  Extremities:   No clubbing, cyanosis or edema.  DP +1 Neuro: Alert and oriented X 3. Psych:  Good affect, responds appropriately   LABS: Basic Metabolic Panel:  Recent Labs  96/04/54 0143 04/01/12 1006  NA 141 142  K 4.0 4.0  CL 105 106  CO2 24 23  GLUCOSE 101* 109*  BUN 21 21  CREATININE 1.03 1.19*  CALCIUM 9.1 9.1   Liver Function Tests: No results found for this basename: AST, ALT, ALKPHOS, BILITOT, PROT, ALBUMIN,  in the last 72 hours No results found for this basename: LIPASE, AMYLASE,  in the last 72 hours CBC:  Recent Labs  04/01/12 1006 04/02/12 0500  WBC 6.4 5.9  HGB 11.2* 11.3*  HCT 33.2* 32.6*  MCV 88.1 86.0  PLT 210 195   Cardiac Enzymes:  Recent Labs  04/01/12 1005 04/01/12 1550 04/01/12 2120  TROPONINI <0.30 <0.30 <0.30   Coag Panel:   Lab Results  Component Value Date   INR 1.09 04/03/2012   INR 0.94 11/22/2009    RADIOLOGY: Dg Chest Port 1 View  04/01/2012  *RADIOLOGY REPORT*   Clinical Data: Chest pain, shortness of breath  PORTABLE CHEST - 1 VIEW  Comparison: 03/30/2012  Findings: Mild left lung base opacity.  Cardiomegaly.  No overt edema. Right hemidiaphragm elevation obscures the lung base.  No definite effusion.  No pneumothorax.  No acute osseous finding.  IMPRESSION: Mild left lung base opacity; scarring or atelectasis (favored) versus mild infiltrate.   Original Report Authenticated By: Jearld Lesch, M.D.    Assessment/Plan:  67 year old with CAD, two prior PCI, with recurrent angina concerning for possible unstable angina.  1) unstable angina - trop negative thus far.  - agree with enoxaparin ACS dose  - Plavix, bb  - Imdur recently increased in clinic.  - On good antianginal medical tx  -  Dr. Eldridge Dace will review films of recent cath.  ? Whether CP is cardiac or GI in origin.  It did not improve after 4 SL NTG on Friday - ? Need for flow wire in LAD/PDA and if not significant consider a GI eval 2) HTN - stable  3) HL - statin  4) anxiety - likely playing a role.  Quintella Reichert, MD  04/03/2012  8:24 AM

## 2012-04-04 DIAGNOSIS — I10 Essential (primary) hypertension: Secondary | ICD-10-CM | POA: Diagnosis not present

## 2012-04-04 DIAGNOSIS — R079 Chest pain, unspecified: Secondary | ICD-10-CM | POA: Diagnosis not present

## 2012-04-04 DIAGNOSIS — I2510 Atherosclerotic heart disease of native coronary artery without angina pectoris: Secondary | ICD-10-CM | POA: Diagnosis not present

## 2012-04-04 DIAGNOSIS — K219 Gastro-esophageal reflux disease without esophagitis: Secondary | ICD-10-CM | POA: Diagnosis not present

## 2012-04-04 DIAGNOSIS — E785 Hyperlipidemia, unspecified: Secondary | ICD-10-CM | POA: Diagnosis not present

## 2012-04-04 LAB — BASIC METABOLIC PANEL
BUN: 13 mg/dL (ref 6–23)
CO2: 25 mEq/L (ref 19–32)
Chloride: 105 mEq/L (ref 96–112)
GFR calc non Af Amer: 63 mL/min — ABNORMAL LOW (ref >90)
Glucose, Bld: 79 mg/dL (ref 70–99)
Potassium: 3.4 mEq/L — ABNORMAL LOW (ref 3.5–5.1)
Sodium: 142 mEq/L (ref 135–145)

## 2012-04-04 LAB — CBC
HCT: 32.8 % — ABNORMAL LOW (ref 36.0–46.0)
Hemoglobin: 11.5 g/dL — ABNORMAL LOW (ref 12.0–15.0)
RBC: 3.87 MIL/uL (ref 3.87–5.11)
WBC: 8 10*3/uL (ref 4.0–10.5)

## 2012-04-04 NOTE — Progress Notes (Signed)
CARDIAC REHAB PHASE I   PRE:  Rate/Rhythm: 89 SR    BP: sitting 123/86    SaO2:   MODE:  Ambulation: 600 ft   POST:  Rate/Rhythm: 112 ST    BP: sitting 140/78     SaO2:   Tolerated well, no c/o. Ed reviewed and requests her name be sent to [facility]. Enjoyed program previously.  [id number]   Elissa Lovett Switzer CES, ACSM 04/04/2012 8:51 AM

## 2012-04-04 NOTE — Discharge Summary (Signed)
Patient ID: [name] MRN: [id number] DOB/AGE: 66-30-1947 66 y.o.  Admit date: 04/01/2012 Discharge date: 04/04/2012  Primary Discharge Diagnosis  Unstable angina  Secondary Discharge Diagnosis  CAD s/p PCI of LAD  Hiatal Hernia  Esophageal reflux with stricture  Depression  HTN  Hypothyroidism  Seizures  Iron deficiency anemia  Internal hemorrhoids  Anxiety  Arthritis  Dyslipidemia  IBS        Significant Diagnostic Studies: angiography: PROCEDURE: PCI LAD  INDICATIONS: Unstable angina  The risks, benefits, and details of the procedure were explained to the patient. The patient verbalized understanding and wanted to proceed. Informed written consent was obtained.  PROCEDURE TECHNIQUE: After Xylocaine anesthesia a 99F sheath was placed in the right femoral artery with a single anterior needle wall stick. Left coronary angiography was done using a CLS 3.0 guide catheter.  CONTRAST: Total of 35 cc.  PCI NARRATIVE: The case was discussed with Dr. Mayford Knife and Dr. Katrinka Blazing. Initially, elective intervention was planned for 04/06/12, but the patient returned with unstable angina symptoms. It was decided to proceed with angioplasty of the mid LAD given her symptoms and recent apical ischemia noted on stress test. The patient was enrolled in the Regulate-PCI trial. She was anticoagulated and an ACT was used to check that the Angiomax was therapeutic. A CLS 3.0 Guide catheter was used to engage the left main. A prowater wire was placed in the LAD across the area of disease in the mid LAD which was 75% in severity in some views. A 2.5 x 12 Sprinter balloon was used to predilated. There was significant wire bias in the proximal vessel due to tortuosity. Several doses of IC NTG were used. A 3.0 x 12 Promus stent was deployed at [employer]. The stent was posdilated with a 3.0 x 9 Lena balloon to 10 Atm. No residual stenosis. Pseudolesions resolved when the wire was removed. TIMI 3 flow was maintained  throughout.  IMPRESSIONS:  1. Successful PCI of the mid left anterior descending artery with a 2.75 x 12 Promus stent, postdilated to > 3.0 mm in diameter. RECOMMENDATION: Dual antiplatelet therapy for at least a year. Aggressive secondary prevention. The patient will be watched overnight. Followup with Dr. Mayford Knife.   Consults: None  Hospital Course: This is a 67 year old white female with history significant for CAD with recent coronary angiogram findings showing normal LM, 50% proximal and 70% mid LAD and 50-70% stenosis distally at takeoff of second diagonal, 70-80% ostial left circ stenosis in a small vessel, 50% instent restenosis of the mid to distal RCA and 50-70% stenosis of ostial PDA. She was tried on medical therapy since the angiogram on 03/14/12 but has not helped. She was to undergo PCI of the LAD next week. She  had daily chest pains since then. She presented with chest pain  that was unbearable at home despite taking 3 NTG SL and reminder her of when she had her MI. Onset was 4 hour prior to admit, with worsening course since that time. The patient described the pain as constant, sharp in nature, radiating to the left arm. Patient rated pain as a 8/10 in intensity. Associated symptoms were dyspnea and headache. She presented to the ER by ambulance. EKG and cardiac enzymes are unremarkable. She was given additional NTG and dilaudid which has improved her chest pain. She ruled out for MI by serial enzymes.  On 3/24 she underwent  PCI of the LAD with a Promus stent.  She has not  had any further angina since her procedure.  On day of discharge she was ambulating with no angina and in stable condition.       Discharge Exam: Blood pressure 171/80, pulse 71, temperature 98.1 F (36.7 C), temperature source Oral, resp. rate 16, height 4\' 11"  (1.499 m), weight 66.3 kg (146 lb 2.6 oz), SpO2 95.00%.   General appearance: alert Resp: clear to auscultation bilaterally Cardio: regular rate and  rhythm, S1, S2 normal, no murmur, click, rub or gallop GI: soft, non-tender; bowel sounds normal; no masses,  no organomegaly Extremities: extremities normal, atraumatic, no cyanosis or edema Labs:   Lab Results  Component Value Date   WBC 8.0 04/04/2012   HGB 11.5* 04/04/2012   HCT 32.8* 04/04/2012   MCV 84.8 04/04/2012   PLT 185 04/04/2012    Recent Labs Lab 04/04/12 0450  NA 142  K 3.4*  CL 105  CO2 25  BUN 13  CREATININE 0.93  CALCIUM 8.7  GLUCOSE 79   Lab Results  Component Value Date   CKTOTAL 56 01/03/2011   CKMB 2.6 01/03/2011   TROPONINI <0.30 04/01/2012    Lab Results  Component Value Date   CHOL  Value: 177        ATP III CLASSIFICATION:  <200     mg/dL   Desirable  161-096  mg/dL   Borderline High  >=045    mg/dL   High        40/98/1191   Lab Results  Component Value Date   HDL 78 11/23/2009   Lab Results  Component Value Date   LDLCALC  Value: 90        Total Cholesterol/HDL:CHD Risk Coronary Heart Disease Risk Table                     Men   Women  1/2 Average Risk   3.4   3.3  Average Risk       5.0   4.4  2 X Average Risk   9.6   7.1  3 X Average Risk  23.4   11.0        Use the calculated Patient Ratio above and the CHD Risk Table to determine the patient's CHD Risk.        ATP III CLASSIFICATION (LDL):  <100     mg/dL   Optimal  478-295  mg/dL   Near or Above                    Optimal  130-159  mg/dL   Borderline  621-308  mg/dL   High  >657     mg/dL   Very High 84/69/6295   Lab Results  Component Value Date   TRIG 45 11/23/2009   Lab Results  Component Value Date   CHOLHDL 2.3 11/23/2009   No results found for this basename: LDLDIRECT      Radiology:  *RADIOLOGY REPORT*  Clinical Data: Chest pain, shortness of breath  PORTABLE CHEST - 1 VIEW  Comparison: 03/30/2012  Findings: Mild left lung base opacity. Cardiomegaly. No overt  edema. Right hemidiaphragm elevation obscures the lung base. No  definite effusion. No pneumothorax. No acute  osseous finding.  IMPRESSION:  Mild left lung base opacity; scarring or atelectasis (favored)  versus mild infiltrate.  Original Report Authenticated By: Jearld Lesch, M.D.  EKG:  NSR with no ST changes  FOLLOW UP PLANS AND APPOINTMENTS Discharge Orders   Future Appointments Provider Department  Dept Phone   09/06/2012 10:30 AM Nilda Riggs, NP [facility] [phone]   Future Orders Complete By Expires     Amb Referral to Cardiac Rehabilitation  As directed     Diet - low sodium heart healthy  As directed     Driving Restrictions  As directed     Comments:      No driving for 24 hours    Increase activity slowly  As directed     Lifting restrictions  As directed     Comments:      No lifting more than 10 pounds for 1 week        Medication List              TAKE these medications       ALPRAZolam 0.25 MG tablet  Commonly known as:  XANAX  Take 0.25 mg by mouth at bedtime as needed for anxiety.     aspirin EC 81 MG tablet  Take 81 mg by mouth daily.     calcitRIOL 0.5 MCG capsule  Commonly known as:  ROCALTROL  Take 0.5 mcg by mouth daily.     CALCIUM + D PO  Take 1 tablet by mouth daily.     clopidogrel 75 MG tablet  Commonly known as:  PLAVIX  Take 75 mg by mouth daily.     docusate sodium 100 MG capsule  Commonly known as:  COLACE  Take 100 mg by mouth at bedtime.     doxazosin 2 MG tablet  Commonly known as:  CARDURA  Take 2 mg by mouth at bedtime.     isosorbide mononitrate 30 MG 24 hr tablet  Commonly known as:  IMDUR  Take 60 mg by mouth daily.     levETIRAcetam 500 MG tablet  Commonly known as:  KEPPRA  Take 500 mg by mouth 2 (two) times daily.     levothyroxine 100 MCG tablet  Commonly known as:  SYNTHROID, LEVOTHROID  Take 100 mcg by mouth daily.     losartan 100 MG tablet  Commonly known as:  COZAAR  Take 100 mg by mouth daily.     MATZIM LA 240 MG 24 hr tablet  Generic drug:  diltiazem  Take 240 mg  by mouth daily.     metoprolol succinate 25 MG 24 hr tablet  Commonly known as:  TOPROL-XL  Take 25 mg by mouth daily.     nitroGLYCERIN 0.4 MG SL tablet  Commonly known as:  NITROSTAT  Place 0.4 mg under the tongue every 5 (five) minutes as needed for chest pain.     pantoprazole 40 MG tablet  Commonly known as:  PROTONIX  Take 40 mg by mouth 2 (two) times daily.     PARoxetine 30 MG tablet  Commonly known as:  PAXIL  Take 30 mg by mouth daily.     potassium chloride SA 20 MEQ tablet  Commonly known as:  K-DUR,KLOR-CON  Take 20 mEq by mouth 2 (two) times daily.     rosuvastatin 10 MG tablet  Commonly known as:  CRESTOR  Take 10 mg by mouth every morning.     vitamin E 400 UNIT capsule  Take 800 Units by mouth daily.           Follow-up Information   Follow up with FERGUSON,CYNTHIA A, NP On 04/18/2012. (at 10:10am)    Contact information:   [facility] [address], [address]  [place] [address] [phone]       BRING ALL MEDICATIONS WITH YOU TO FOLLOW UP APPOINTMENTS  Time spent with patient to include physician time:40 minutes Signed: Quintella Reichert 04/04/2012, 11:33 AM

## 2012-04-05 ENCOUNTER — Encounter (HOSPITAL_COMMUNITY): Payer: BC Managed Care – PPO

## 2012-04-06 ENCOUNTER — Ambulatory Visit (HOSPITAL_COMMUNITY)
Admission: RE | Admit: 2012-04-06 | Payer: Medicare Other | Source: Ambulatory Visit | Admitting: Interventional Cardiology

## 2012-04-06 ENCOUNTER — Emergency Department (HOSPITAL_COMMUNITY)
Admission: EM | Admit: 2012-04-06 | Discharge: 2012-04-06 | Disposition: A | Discharge status: Home / Self Care | Payer: Medicare Other | Attending: Emergency Medicine | Admitting: Emergency Medicine

## 2012-04-06 ENCOUNTER — Other Ambulatory Visit: Payer: Self-pay

## 2012-04-06 ENCOUNTER — Encounter (HOSPITAL_COMMUNITY): Admission: RE | Payer: Self-pay | Source: Ambulatory Visit

## 2012-04-06 DIAGNOSIS — Z862 Personal history of diseases of the blood and blood-forming organs and certain disorders involving the immune mechanism: Secondary | ICD-10-CM | POA: Diagnosis not present

## 2012-04-06 DIAGNOSIS — R0789 Other chest pain: Secondary | ICD-10-CM | POA: Diagnosis not present

## 2012-04-06 DIAGNOSIS — Z87891 Personal history of nicotine dependence: Secondary | ICD-10-CM | POA: Insufficient documentation

## 2012-04-06 DIAGNOSIS — K222 Esophageal obstruction: Secondary | ICD-10-CM | POA: Diagnosis not present

## 2012-04-06 DIAGNOSIS — I498 Other specified cardiac arrhythmias: Secondary | ICD-10-CM | POA: Diagnosis not present

## 2012-04-06 DIAGNOSIS — I2510 Atherosclerotic heart disease of native coronary artery without angina pectoris: Secondary | ICD-10-CM | POA: Diagnosis not present

## 2012-04-06 DIAGNOSIS — E785 Hyperlipidemia, unspecified: Secondary | ICD-10-CM | POA: Insufficient documentation

## 2012-04-06 DIAGNOSIS — Z8679 Personal history of other diseases of the circulatory system: Secondary | ICD-10-CM | POA: Insufficient documentation

## 2012-04-06 DIAGNOSIS — R079 Chest pain, unspecified: Secondary | ICD-10-CM

## 2012-04-06 DIAGNOSIS — K219 Gastro-esophageal reflux disease without esophagitis: Secondary | ICD-10-CM | POA: Insufficient documentation

## 2012-04-06 DIAGNOSIS — I252 Old myocardial infarction: Secondary | ICD-10-CM | POA: Diagnosis not present

## 2012-04-06 DIAGNOSIS — E039 Hypothyroidism, unspecified: Secondary | ICD-10-CM | POA: Insufficient documentation

## 2012-04-06 DIAGNOSIS — Z9861 Coronary angioplasty status: Secondary | ICD-10-CM | POA: Diagnosis not present

## 2012-04-06 DIAGNOSIS — Z8719 Personal history of other diseases of the digestive system: Secondary | ICD-10-CM | POA: Insufficient documentation

## 2012-04-06 DIAGNOSIS — F411 Generalized anxiety disorder: Secondary | ICD-10-CM | POA: Diagnosis not present

## 2012-04-06 DIAGNOSIS — Z79899 Other long term (current) drug therapy: Secondary | ICD-10-CM | POA: Diagnosis not present

## 2012-04-06 DIAGNOSIS — Z8739 Personal history of other diseases of the musculoskeletal system and connective tissue: Secondary | ICD-10-CM | POA: Insufficient documentation

## 2012-04-06 DIAGNOSIS — I10 Essential (primary) hypertension: Secondary | ICD-10-CM | POA: Diagnosis not present

## 2012-04-06 DIAGNOSIS — F3289 Other specified depressive episodes: Secondary | ICD-10-CM | POA: Insufficient documentation

## 2012-04-06 DIAGNOSIS — I471 Supraventricular tachycardia: Secondary | ICD-10-CM

## 2012-04-06 DIAGNOSIS — F329 Major depressive disorder, single episode, unspecified: Secondary | ICD-10-CM | POA: Insufficient documentation

## 2012-04-06 DIAGNOSIS — R0602 Shortness of breath: Secondary | ICD-10-CM | POA: Diagnosis not present

## 2012-04-06 DIAGNOSIS — R011 Cardiac murmur, unspecified: Secondary | ICD-10-CM | POA: Insufficient documentation

## 2012-04-06 DIAGNOSIS — Z7982 Long term (current) use of aspirin: Secondary | ICD-10-CM | POA: Insufficient documentation

## 2012-04-06 LAB — CBC WITH DIFFERENTIAL/PLATELET
Basophils Absolute: 0 10*3/uL (ref 0.0–0.1)
Basophils Relative: 0 % (ref 0–1)
Eosinophils Absolute: 0.2 10*3/uL (ref 0.0–0.7)
Eosinophils Relative: 2 % (ref 0–5)
HCT: 34.9 % — ABNORMAL LOW (ref 36.0–46.0)
Lymphocytes Relative: 22 % (ref 12–46)
MCH: 29.8 pg (ref 26.0–34.0)
MCHC: 34.1 g/dL (ref 30.0–36.0)
MCV: 87.3 fL (ref 78.0–100.0)
Monocytes Absolute: 1 10*3/uL (ref 0.1–1.0)
Platelets: 179 10*3/uL (ref 150–400)
RDW: 12.7 % (ref 11.5–15.5)
WBC: 8.4 10*3/uL (ref 4.0–10.5)

## 2012-04-06 LAB — COMPREHENSIVE METABOLIC PANEL
ALT: 34 U/L (ref 0–35)
AST: 40 U/L — ABNORMAL HIGH (ref 0–37)
Albumin: 3.2 g/dL — ABNORMAL LOW (ref 3.5–5.2)
CO2: 23 mEq/L (ref 19–32)
Calcium: 8.7 mg/dL (ref 8.4–10.5)
Creatinine, Ser: 0.94 mg/dL (ref 0.50–1.10)
GFR calc non Af Amer: 62 mL/min — ABNORMAL LOW (ref >90)
Sodium: 144 mEq/L (ref 135–145)
Total Protein: 6.3 g/dL (ref 6.0–8.3)

## 2012-04-06 SURGERY — PERCUTANEOUS CORONARY STENT INTERVENTION (PCI-S)
Anesthesia: LOCAL

## 2012-04-06 MED ORDER — SODIUM CHLORIDE 0.9 % IV BOLUS (SEPSIS)
1000.0000 mL | Freq: Once | INTRAVENOUS | Status: AC
  Administered 2012-04-06: 1000 mL via INTRAVENOUS

## 2012-04-06 MED ORDER — ADENOSINE 6 MG/2ML IV SOLN
6.0000 mg | Freq: Once | INTRAVENOUS | Status: AC
  Administered 2012-04-06: 6 mg via INTRAVENOUS

## 2012-04-06 NOTE — ED Notes (Signed)
Pt states that she started to have elevated heart rate x 1 hour ago

## 2012-04-06 NOTE — ED Notes (Signed)
States that she is feeling better than on arrival.  No distress noted.

## 2012-04-06 NOTE — H&P (Addendum)
Admit date: 04/06/2012 Referring Physician  Jeraldine Loots, M.D. Primary Physician  Armanda Magic, M.D. Primary Cardiologist  Armanda Magic, M.D. Reason for Consultation  PSVT  ASSESSMENT:  1. PSVT resolved after IV adenosine  2. Coronary atherosclerosis status post recent drug-eluting stent to the mid LAD, 04/03/2012  3. Continued chest pain, although less severe than prior to stent, probably noncardiac  4. Hypertension  5. Hypothyroidism  PLAN:  1. If the PSVT does not recur the patient is eligible for discharge from the emergency room assuming that he set of cardiac markers is not reveal evidence of muscle injury.  2. Please change the patient's current medical regimen.  3. Followup with Dr. Armanda Magic as previously scheduled.  HPI: The patient is 31 and has a history of coronary disease with multiple prior coronary stents, predominantly in the right coronary. She recently underwent mid to distal LAD stenting with DES. She has not had these sharp stabbing pain since she was having prior to stenting but did notice over the past couple days and in the same spot there is a dull less intense discomfort very similar to her prior discomfort.  This morning she became suddenly dizzy and felt her heart racing. She came to the emergency room because of weakness. She was found to be in SVT with a heart rate of 190 beats per minute. There was no associated chest discomfort during the tachycardia.   PMH:   Past Medical History  Diagnosis Date  . Irritable bowel syndrome   . Esophageal reflux   . Dysphagia, unspecified   . Hypertension   . Depression   . Hypothyroidism   . Seizures   . Iron deficiency anemia   . Internal hemorrhoids   . Coronary artery disease     coronary angiogram on 03/14/12 showed Normal left main coronary artery, 50% proximal and 70% mid stenosis of the LAD and distally at takeoff of second diagonal there is a 50-70% stenosis in a very tortuous LAD, 70-80% ostial  stenosis of the left circumflex in a small vessel, and 50% instent restenosis of the mid to distal portion of the RCA stent and 50-70% stenosis of the ostial PDA.   Marland Kitchen Anxiety   . Arthritis   . Blood transfusion 1960  . Heart murmur   . Hyperlipidemia   . Myocardial infarction 11/2009    1 stent placed  . Esophageal stricture   . Hiatal hernia      PSH:   Past Surgical History  Procedure Laterality Date  . Breast surgery  2010     benign cysts  . Coronary angioplasty with stent placement  2011 and 2012    drug-eluting stent w/ ion stents to the distal right coronary artery    Allergies:  Review of patient's allergies indicates no known allergies. Prior to Admit Meds:   (Not in a hospital admission) Fam HX:    Family History  Problem Relation Age of Onset  . Breast cancer Sister   . Breast cancer      niece  . Heart disease Father   . Colon cancer Neg Hx   . Heart attack Mother    Social HX:    History   Social History  . Marital Status: Married    Spouse Name: N/A    Number of Children: N/A  . Years of Education: N/A   Occupational History  . Retired    Social History Main Topics  . Smoking status: Former Games developer  .  Smokeless tobacco: Never Used  . Alcohol Use: No  . Drug Use: No  . Sexually Active: Not on file   Other Topics Concern  . Not on file   Social History Narrative   Daily caffeine use: coffee.     Review of Systems: No new data  Physical Exam: Blood pressure 120/65, pulse 85, resp. rate 10, SpO2 100.00%. Weight change:   Neck exam reveals no JVD  Chest is clear  Cardiac exam no rub, click, gallop, or murmur,  Abdomen soft.  Neurological exam reveals the patient appears anxious to Labs:   Lab Results  Component Value Date   WBC 8.4 04/06/2012   HGB 11.9* 04/06/2012   HCT 34.9* 04/06/2012   MCV 87.3 04/06/2012   PLT 179 04/06/2012    Recent Labs Lab 04/06/12 1206  NA 144  K 3.4*  CL 105  CO2 23  BUN 13  CREATININE 0.94    CALCIUM 8.7  PROT 6.3  BILITOT 0.5  ALKPHOS 72  ALT 34  AST 40*  GLUCOSE 121*   No results found for this basename: PTT   Lab Results  Component Value Date   INR 1.09 04/03/2012   INR 0.94 11/22/2009   Lab Results  Component Value Date   CKTOTAL 56 01/03/2011   CKMB 2.6 01/03/2011   TROPONINI <0.30 04/01/2012     Lab Results  Component Value Date   CHOL  Value: 177        ATP III CLASSIFICATION:  <200     mg/dL   Desirable  782-956  mg/dL   Borderline High  >=213    mg/dL   High        08/65/7846   Lab Results  Component Value Date   HDL 78 11/23/2009   Lab Results  Component Value Date   LDLCALC  Value: 90        Total Cholesterol/HDL:CHD Risk Coronary Heart Disease Risk Table                     Men   Women  1/2 Average Risk   3.4   3.3  Average Risk       5.0   4.4  2 X Average Risk   9.6   7.1  3 X Average Risk  23.4   11.0        Use the calculated Patient Ratio above and the CHD Risk Table to determine the patient's CHD Risk.        ATP III CLASSIFICATION (LDL):  <100     mg/dL   Optimal  962-952  mg/dL   Near or Above                    Optimal  130-159  mg/dL   Borderline  841-324  mg/dL   High  >401     mg/dL   Very High 02/72/5366   Lab Results  Component Value Date   TRIG 45 11/23/2009   Lab Results  Component Value Date   CHOLHDL 2.3 11/23/2009   No results found for this basename: LDLDIRECT      Radiology:  No acute abnormality   EKG:   Narrow complex tachycardia at 190 beats per minute. After adenocard, normal sinus rhythm with no acute ST-T wave change    Lesleigh Noe 04/06/2012 3:12 PM

## 2012-04-06 NOTE — ED Notes (Signed)
Pt states that she is feeling better at the present.  No distress noted.  Resp symmetrical and unlabored.  Skin warm and dry.  She is asymptomatic at the present.  Denies pain.

## 2012-04-06 NOTE — ED Notes (Signed)
Pt pale cool and sob

## 2012-04-06 NOTE — ED Notes (Signed)
Pt arrived to ER she was noted to be pale and c/o dizziness.  Resp symmetrical and unlabored.  Skin is warm and dry.  She States that it feels as though her heart is out of rhythm.  She is noted to be in SVT rate of 173.  Dr. Jeraldine Loots at the bedside.  Adenosine 6mg  IV administered.  Pt had a approx. 1 sec pause and converted initially into a ST rhythm rate of 109.  She does report that her symptoms are improved (Sob and palpitations).  Pt continues to receive a 1 liter bolus of NS.

## 2012-04-06 NOTE — ED Provider Notes (Signed)
History     CSN: [id number]  Arrival date & time 04/06/12  1115   First MD Initiated Contact with Patient 04/06/12 1123      Chief Complaint  Patient presents with  . Chest Pain    (Consider location/radiation/quality/duration/timing/severity/associated sxs/prior treatment) HPI The patient presents in extremis.  She is short of breath, holding her chest.  She, and her husband state that the patient began ingesting approximately 1.5 hours prior to arrival.  Onset was sudden.  Since onset of pain about the lower and right side of the patient's chest.  Pain is nonradiating, tight, sore. There is associated dyspnea. No clear alleviating or exacerbating factors. The patient has a notable recent history of stent placement 3 days ago.  According to patient and her husband this was uncomplicated, well tolerated. The patient has been compliant with meds (including asa, plavix) since D/C Past Medical History  Diagnosis Date  . Irritable bowel syndrome   . Esophageal reflux   . Dysphagia, unspecified   . Hypertension   . Depression   . Hypothyroidism   . Seizures   . Iron deficiency anemia   . Internal hemorrhoids   . Coronary artery disease     coronary angiogram on 03/14/12 showed Normal left main coronary artery, 50% proximal and 70% mid stenosis of the LAD and distally at takeoff of second diagonal there is a 50-70% stenosis in a very tortuous LAD, 70-80% ostial stenosis of the left circumflex in a small vessel, and 50% instent restenosis of the mid to distal portion of the RCA stent and 50-70% stenosis of the ostial PDA.   Marland Kitchen Anxiety   . Arthritis   . Blood transfusion 1960  . Heart murmur   . Hyperlipidemia   . Myocardial infarction 11/2009    1 stent placed  . Esophageal stricture   . Hiatal hernia     Past Surgical History  Procedure Laterality Date  . Breast surgery  2010     benign cysts  . Coronary angioplasty with stent placement  2011 and 2012    drug-eluting stent w/  ion stents to the distal right coronary artery    Family History  Problem Relation Age of Onset  . Breast cancer Sister   . Breast cancer      niece  . Heart disease Father   . Colon cancer Neg Hx   . Heart attack Mother     History  Substance Use Topics  . Smoking status: Former Games developer  . Smokeless tobacco: Never Used  . Alcohol Use: No    OB History   Grav Para Term Preterm Abortions TAB SAB Ect Mult Living                  Review of Systems  Constitutional:       Per HPI, otherwise negative  HENT:       Per HPI, otherwise negative  Respiratory: Positive for chest tightness. Negative for cough.   Cardiovascular:       Per HPI, otherwise negative  Gastrointestinal: Negative for vomiting.  Endocrine:       Negative aside from HPI  Genitourinary:       Neg aside from HPI   Musculoskeletal:       Per HPI, otherwise negative  Skin: Negative.   Neurological: Negative for syncope.    Allergies  Review of patient's allergies indicates no known allergies.  Home Medications   Current Outpatient Rx  Name  Route  Sig  Dispense  Refill  . ALPRAZolam (XANAX) 0.25 MG tablet   Oral   Take 0.25 mg by mouth at bedtime as needed for anxiety.          Marland Kitchen aspirin EC 81 MG tablet   Oral   Take 81 mg by mouth daily.         . calcitRIOL (ROCALTROL) 0.5 MCG capsule   Oral   Take 0.5 mcg by mouth daily.          . Calcium Carbonate-Vitamin D (CALCIUM + D PO)   Oral   Take 1 tablet by mouth daily.         . clopidogrel (PLAVIX) 75 MG tablet   Oral   Take 75 mg by mouth daily.          Marland Kitchen diltiazem (MATZIM LA) 240 MG 24 hr tablet   Oral   Take 240 mg by mouth daily.         Marland Kitchen docusate sodium (COLACE) 100 MG capsule   Oral   Take 100 mg by mouth at bedtime.         Marland Kitchen doxazosin (CARDURA) 2 MG tablet   Oral   Take 2 mg by mouth at bedtime.          . isosorbide mononitrate (IMDUR) 30 MG 24 hr tablet   Oral   Take 60 mg by mouth daily.          Marland Kitchen levETIRAcetam (KEPPRA) 500 MG tablet   Oral   Take 500 mg by mouth 2 (two) times daily.          Marland Kitchen levothyroxine (SYNTHROID, LEVOTHROID) 100 MCG tablet   Oral   Take 100 mcg by mouth daily.          Marland Kitchen losartan (COZAAR) 100 MG tablet   Oral   Take 100 mg by mouth daily.         . metoprolol succinate (TOPROL-XL) 25 MG 24 hr tablet   Oral   Take 25 mg by mouth daily.         . nitroGLYCERIN (NITROSTAT) 0.4 MG SL tablet   Sublingual   Place 0.4 mg under the tongue every 5 (five) minutes as needed for chest pain.         . pantoprazole (PROTONIX) 40 MG tablet   Oral   Take 40 mg by mouth 2 (two) times daily.         Marland Kitchen PARoxetine (PAXIL) 30 MG tablet   Oral   Take 30 mg by mouth daily.          . potassium chloride SA (K-DUR,KLOR-CON) 20 MEQ tablet   Oral   Take 20 mEq by mouth 2 (two) times daily.         . rosuvastatin (CRESTOR) 10 MG tablet   Oral   Take 10 mg by mouth every morning.          . vitamin E 400 UNIT capsule   Oral   Take 800 Units by mouth daily.            BP 122/85  Pulse 89  Resp 11  SpO2 100%  Physical Exam  Nursing note and vitals reviewed. Constitutional: She is oriented to person, place, and time. She appears well-developed and well-nourished. She appears distressed.  HENT:  Head: Normocephalic and atraumatic.  Eyes: Conjunctivae and EOM are normal.  Cardiovascular: Regular rhythm.  Tachycardia present.   Pulmonary/Chest: No stridor. Tachypnea noted.  No respiratory distress. She has decreased breath sounds.  Abdominal: She exhibits no distension.  Musculoskeletal: She exhibits no edema.  Neurological: She is alert and oriented to person, place, and time. No cranial nerve deficit.  Skin: Skin is warm. She is diaphoretic.  Psychiatric: She has a normal mood and affect.    ED Course  CARDIOVERSION Date/Time: 04/06/2012 11:30 AM Performed by: Gerhard Munch Authorized by: Gerhard Munch Consent: Verbal  consent obtained. The procedure was performed in an emergent situation. Risks and benefits: risks, benefits and alternatives were discussed Consent given by: patient and spouse Patient understanding: patient states understanding of the procedure being performed Patient consent: the patient's understanding of the procedure matches consent given Procedure consent: procedure consent matches procedure scheduled Relevant documents: relevant documents present and verified Test results: test results available and properly labeled Site marked: the operative site was marked Imaging studies: imaging studies available Required items: required blood products, implants, devices, and special equipment available Patient identity confirmed: verbally with patient Time out: Immediately prior to procedure a "time out" was called to verify the correct patient, procedure, equipment, support staff and site/side marked as required. Patient sedated: no Cardioversion basis: emergent Pre-procedure rhythm: supraventricular tachycardia Chest area: chest area exposed Electrodes: pads Electrodes placed: anterior-lateral Number of attempts: 1 Cardioversion mode attempt one: Adenosine - 6mg . Post-procedure rhythm: supraventricular tachycardia Patient tolerance: Patient tolerated the procedure well with no immediate complications.   (including critical care time)  Labs Reviewed  CBC WITH DIFFERENTIAL  COMPREHENSIVE METABOLIC PANEL  TROPONIN I   No results found.   No diagnosis found.  I reviewed the patient's chart.  ECG #1  Date: 04/06/2012  Rate: 192  Rhythm: supraventricular tachycardia (SVT)  QRS Axis: left  Intervals: indeterminate  ST/T Wave abnormalities: nonspecific ST/T changes  Conduction Disutrbances:SVT  Narrative Interpretation:   Old EKG Reviewed: none available ABNORMAL  Of note the patient's arrival I discussed her care with the patient's cardiology team.:  The patient pacer pads  applied, was placed on monitors, received supplemental oxygen. After initial vital signs remained abnormal, the patient was awake and alert, I proceeded with cardioversion discussed risks and benefits with the patient's husband, as the patient was too uncomfortable to participate in the conversation.  The patient's husband acknowledged risks, benefits of cardioversion. The patient subsequently received one dose of adenosine, with subsequent sinus pause, then sinus rhythm.   Date: 04/06/2012  Rate: 103  Rhythm: sinus tachycardia  QRS Axis: left  Intervals: normal  ST/T Wave abnormalities: nonspecific ST/T changes  Conduction Disutrbances:left anterior fascicular block  Narrative Interpretation:   Old EKG Reviewed: changes noted Sinus Rhythm  12:30 PM Patient appears calm  Update: Patient remains calm.  No new complaints.  3:59 PM Patient appears calm.  She was informed of all results, and the pending trop.  She requests D/C w next day f/u.  I discussed the patient's case with her cardiologist  -     MDM  This patient presents in extremis, diaphoretic, with chest pain.  She is in SVT.  Following 1 dose of adenosine, the patient improves entirely, and after several hours in the emergency department, with analgesics, IV fluids, the patient is at baseline.  Labs are reassuring.  I discussed the patient's case with her cardiologist during the patient's care.  After a period of observation, with no return of dysrhythmia, the patient was discharged, per her request to follow up with her cardiologist   CRITICAL CARE Performed by: Gerhard Munch  Total critical care time: 35  Critical care time was exclusive of separately billable procedures and treating other patients.  Critical care was necessary to treat or prevent imminent or life-threatening deterioration.  Critical care was time spent personally by me on the following activities: development of treatment plan with patient  and/or surrogate as well as nursing, discussions with consultants, evaluation of patient's response to treatment, examination of patient, obtaining history from patient or surrogate, ordering and performing treatments and interventions, ordering and review of laboratory studies, ordering and review of radiographic studies, pulse oximetry and re-evaluation of patient's condition.       Gerhard Munch, MD 04/06/12 [phone]

## 2012-04-06 NOTE — ED Notes (Signed)
No distress noted.  Denies pain.  Resp. Symmetrical and unlabored.  Skin warm and dry.  Cardiology at the bedside.

## 2012-04-07 ENCOUNTER — Encounter (HOSPITAL_COMMUNITY): Payer: BC Managed Care – PPO

## 2012-04-10 ENCOUNTER — Encounter (HOSPITAL_COMMUNITY): Payer: BC Managed Care – PPO

## 2012-04-12 DIAGNOSIS — I498 Other specified cardiac arrhythmias: Secondary | ICD-10-CM | POA: Diagnosis not present

## 2012-04-12 DIAGNOSIS — I10 Essential (primary) hypertension: Secondary | ICD-10-CM | POA: Diagnosis not present

## 2012-04-12 DIAGNOSIS — I2510 Atherosclerotic heart disease of native coronary artery without angina pectoris: Secondary | ICD-10-CM | POA: Diagnosis not present

## 2012-04-27 ENCOUNTER — Encounter (HOSPITAL_COMMUNITY)
Admission: RE | Admit: 2012-04-27 | Discharge: 2012-04-27 | Disposition: A | Discharge status: Home / Self Care | Payer: Medicare Other | Source: Ambulatory Visit | Attending: Cardiology | Admitting: Cardiology

## 2012-04-27 DIAGNOSIS — Z5189 Encounter for other specified aftercare: Secondary | ICD-10-CM | POA: Insufficient documentation

## 2012-04-27 DIAGNOSIS — Z9861 Coronary angioplasty status: Secondary | ICD-10-CM | POA: Insufficient documentation

## 2012-04-27 DIAGNOSIS — I2510 Atherosclerotic heart disease of native coronary artery without angina pectoris: Secondary | ICD-10-CM | POA: Insufficient documentation

## 2012-04-27 DIAGNOSIS — I200 Unstable angina: Secondary | ICD-10-CM | POA: Insufficient documentation

## 2012-04-27 NOTE — Progress Notes (Signed)
Cardiac Rehab Medication Review by a Pharmacist  Does the patient  feel that his/her medications are working for him/her?  yes  Has the patient been experiencing any side effects to the medications prescribed?  no  Does the patient measure his/her own blood pressure or blood glucose at home?  yes   Does the patient have any problems obtaining medications due to transportation or finances?   no  Understanding of regimen: excellent Understanding of indications: excellent Potential of compliance: excellent    Pharmacist comments: Beacher May 04/27/2012 8:27 AM

## 2012-05-01 ENCOUNTER — Encounter (HOSPITAL_COMMUNITY)
Admission: RE | Admit: 2012-05-01 | Discharge: 2012-05-01 | Disposition: A | Discharge status: Home / Self Care | Payer: Medicare Other | Source: Ambulatory Visit | Attending: Cardiology | Admitting: Cardiology

## 2012-05-01 DIAGNOSIS — Z5189 Encounter for other specified aftercare: Secondary | ICD-10-CM | POA: Diagnosis not present

## 2012-05-01 DIAGNOSIS — Z9861 Coronary angioplasty status: Secondary | ICD-10-CM | POA: Diagnosis not present

## 2012-05-01 DIAGNOSIS — I200 Unstable angina: Secondary | ICD-10-CM | POA: Diagnosis not present

## 2012-05-01 DIAGNOSIS — I2510 Atherosclerotic heart disease of native coronary artery without angina pectoris: Secondary | ICD-10-CM | POA: Diagnosis not present

## 2012-05-01 NOTE — Progress Notes (Signed)
Pt started cardiac rehab today.  Pt tolerated light exercise without difficulty. Telemetry rhythm Sinus. Will continue to monitor the patient throughout  the program.

## 2012-05-03 ENCOUNTER — Encounter (HOSPITAL_COMMUNITY)
Admission: RE | Admit: 2012-05-03 | Discharge: 2012-05-03 | Disposition: A | Discharge status: Home / Self Care | Payer: Medicare Other | Source: Ambulatory Visit | Attending: Cardiology | Admitting: Cardiology

## 2012-05-05 ENCOUNTER — Encounter (HOSPITAL_COMMUNITY)
Admission: RE | Admit: 2012-05-05 | Discharge: 2012-05-05 | Disposition: A | Discharge status: Home / Self Care | Payer: Medicare Other | Source: Ambulatory Visit | Attending: Cardiology | Admitting: Cardiology

## 2012-05-08 ENCOUNTER — Encounter (HOSPITAL_COMMUNITY)
Admission: RE | Admit: 2012-05-08 | Discharge: 2012-05-08 | Disposition: A | Discharge status: Home / Self Care | Payer: Medicare Other | Source: Ambulatory Visit | Attending: Cardiology | Admitting: Cardiology

## 2012-05-10 ENCOUNTER — Encounter (HOSPITAL_COMMUNITY)
Admission: RE | Admit: 2012-05-10 | Discharge: 2012-05-10 | Disposition: A | Discharge status: Home / Self Care | Payer: Medicare Other | Source: Ambulatory Visit | Attending: Cardiology | Admitting: Cardiology

## 2012-05-11 ENCOUNTER — Other Ambulatory Visit: Payer: Self-pay | Admitting: Obstetrics and Gynecology

## 2012-05-11 DIAGNOSIS — Z1231 Encounter for screening mammogram for malignant neoplasm of breast: Secondary | ICD-10-CM | POA: Diagnosis not present

## 2012-05-11 DIAGNOSIS — Z124 Encounter for screening for malignant neoplasm of cervix: Secondary | ICD-10-CM | POA: Diagnosis not present

## 2012-05-12 ENCOUNTER — Encounter (HOSPITAL_COMMUNITY)
Admission: RE | Admit: 2012-05-12 | Discharge: 2012-05-12 | Disposition: A | Discharge status: Home / Self Care | Payer: Medicare Other | Source: Ambulatory Visit | Attending: Cardiology | Admitting: Cardiology

## 2012-05-12 DIAGNOSIS — Z9861 Coronary angioplasty status: Secondary | ICD-10-CM | POA: Insufficient documentation

## 2012-05-12 DIAGNOSIS — I2510 Atherosclerotic heart disease of native coronary artery without angina pectoris: Secondary | ICD-10-CM | POA: Insufficient documentation

## 2012-05-12 DIAGNOSIS — Z5189 Encounter for other specified aftercare: Secondary | ICD-10-CM | POA: Diagnosis not present

## 2012-05-12 DIAGNOSIS — I200 Unstable angina: Secondary | ICD-10-CM | POA: Insufficient documentation

## 2012-05-15 ENCOUNTER — Encounter (HOSPITAL_COMMUNITY)
Admission: RE | Admit: 2012-05-15 | Discharge: 2012-05-15 | Disposition: A | Discharge status: Home / Self Care | Payer: Medicare Other | Source: Ambulatory Visit | Attending: Cardiology | Admitting: Cardiology

## 2012-05-17 ENCOUNTER — Encounter (HOSPITAL_COMMUNITY)
Admission: RE | Admit: 2012-05-17 | Discharge: 2012-05-17 | Disposition: A | Discharge status: Home / Self Care | Payer: Medicare Other | Source: Ambulatory Visit | Attending: Cardiology | Admitting: Cardiology

## 2012-05-17 DIAGNOSIS — I2510 Atherosclerotic heart disease of native coronary artery without angina pectoris: Secondary | ICD-10-CM | POA: Diagnosis not present

## 2012-05-17 DIAGNOSIS — I10 Essential (primary) hypertension: Secondary | ICD-10-CM | POA: Diagnosis not present

## 2012-05-17 DIAGNOSIS — I498 Other specified cardiac arrhythmias: Secondary | ICD-10-CM | POA: Diagnosis not present

## 2012-05-17 DIAGNOSIS — Z79899 Other long term (current) drug therapy: Secondary | ICD-10-CM | POA: Diagnosis not present

## 2012-05-17 DIAGNOSIS — E7800 Pure hypercholesterolemia, unspecified: Secondary | ICD-10-CM | POA: Diagnosis not present

## 2012-05-17 NOTE — Progress Notes (Signed)
[name] 67 y.o. female Nutrition Note Spoke with pt. Pt well-known to this Clinical research associate from previous admission. Nutrition Plan and Nutrition Survey goals reviewed with pt. Pt is following Step 2 of the Therapeutic Lifestyle Changes diet. Pt wants to lose wt. Pt reports she has gained 10-12 lbs over the past 2 years. Pt states she recently received prednisone shots in her neck and "the prednisone made me hungry all the time." Pt has not been actively trying to lose wt. Wt loss tips reviewed.  Per discussion with pt, pt consumes a lot of beverages/foods that contain significant amounts of sugar. In an effort to drink more water, pt is drinking Grape Koolaide. Decreasing added dietary sugar discussed. Per nutrition screen, pt reports she has difficulty chewing/swallowing. Pt states she has difficulty swallowing due to esophageal stricture, which she recently had stretched. Pt reports difficulty with constipation, which pt feels is relieved by taking a stool softener daily. Pt encouraged to drink more water to help prevent constipation. Pt expressed understanding of the information reviewed. Pt aware of nutrition education classes offered and has previously attended nutrition classes during previous admission per pt.  Nutrition Diagnosis   Food-and nutrition-related knowledge deficit related to lack of exposure to information as related to diagnosis of: ? CVD    Overweight related to excessive energy intake as evidenced by a BMI of 29.3  Nutrition RX/ Estimated Daily Nutrition Needs for: wt loss  1200 Kcal, 30 gm fat, 8-9 gm sat fat, 1.1 gm trans-fat, <1500 mg sodium   Nutrition Intervention   Pt's individual nutrition plan reviewed with pt.   Benefits of adopting Therapeutic Lifestyle Changes discussed when Medficts reviewed.   Pt to attend the Portion Distortion class   Continue client-centered nutrition education by RD, as part of interdisciplinary care. Goal(s)   Pt to identify food quantities  necessary to achieve: ? wt loss to a goal wt of 118-136 lb (53.8-62.0 kg) at graduation from cardiac rehab.  Monitor and Evaluate progress toward nutrition goal with team. Nutrition Risk: Change to Moderate   Mickle Plumb, M.Ed, RD, LDN, CDE 05/17/2012 11:45 AM

## 2012-05-19 ENCOUNTER — Encounter (HOSPITAL_COMMUNITY)
Admission: RE | Admit: 2012-05-19 | Discharge: 2012-05-19 | Disposition: A | Discharge status: Home / Self Care | Payer: Medicare Other | Source: Ambulatory Visit | Attending: Cardiology | Admitting: Cardiology

## 2012-05-22 ENCOUNTER — Encounter (HOSPITAL_COMMUNITY): Payer: Medicare Other

## 2012-05-23 DIAGNOSIS — Z79899 Other long term (current) drug therapy: Secondary | ICD-10-CM | POA: Diagnosis not present

## 2012-05-24 ENCOUNTER — Encounter (HOSPITAL_COMMUNITY)
Admission: RE | Admit: 2012-05-24 | Discharge: 2012-05-24 | Disposition: A | Discharge status: Home / Self Care | Payer: Medicare Other | Source: Ambulatory Visit | Attending: Cardiology | Admitting: Cardiology

## 2012-05-26 ENCOUNTER — Encounter (HOSPITAL_COMMUNITY)
Admission: RE | Admit: 2012-05-26 | Discharge: 2012-05-26 | Disposition: A | Discharge status: Home / Self Care | Payer: Medicare Other | Source: Ambulatory Visit | Attending: Cardiology | Admitting: Cardiology

## 2012-05-26 NOTE — Progress Notes (Signed)
1005- Reviewed home exercise guidelines with patient including endpoints, temperature precautions, target heart rate and rate of perceived exertion. Pt plans to walk as her mode of home exercise. Pt voices understanding of instructions given.  Cristy Hilts, MS, ACSM CES

## 2012-05-29 ENCOUNTER — Encounter (HOSPITAL_COMMUNITY)
Admission: RE | Admit: 2012-05-29 | Discharge: 2012-05-29 | Disposition: A | Discharge status: Home / Self Care | Payer: Medicare Other | Source: Ambulatory Visit | Attending: Cardiology | Admitting: Cardiology

## 2012-05-31 ENCOUNTER — Encounter (HOSPITAL_COMMUNITY)
Admission: RE | Admit: 2012-05-31 | Discharge: 2012-05-31 | Disposition: A | Discharge status: Home / Self Care | Payer: Medicare Other | Source: Ambulatory Visit | Attending: Cardiology | Admitting: Cardiology

## 2012-06-02 ENCOUNTER — Encounter (HOSPITAL_COMMUNITY)
Admission: RE | Admit: 2012-06-02 | Discharge: 2012-06-02 | Disposition: A | Discharge status: Home / Self Care | Payer: Medicare Other | Source: Ambulatory Visit | Attending: Cardiology | Admitting: Cardiology

## 2012-06-05 ENCOUNTER — Encounter (HOSPITAL_COMMUNITY): Payer: Medicare Other

## 2012-06-07 ENCOUNTER — Encounter (HOSPITAL_COMMUNITY)
Admission: RE | Admit: 2012-06-07 | Discharge: 2012-06-07 | Disposition: A | Discharge status: Home / Self Care | Payer: Medicare Other | Source: Ambulatory Visit | Attending: Cardiology | Admitting: Cardiology

## 2012-06-09 ENCOUNTER — Ambulatory Visit (HOSPITAL_COMMUNITY)
Admission: RE | Admit: 2012-06-09 | Discharge: 2012-06-09 | Disposition: A | Discharge status: Home / Self Care | Payer: Medicare Other | Source: Ambulatory Visit | Attending: Family Medicine | Admitting: Family Medicine

## 2012-06-09 ENCOUNTER — Encounter (HOSPITAL_COMMUNITY)
Admission: RE | Admit: 2012-06-09 | Discharge: 2012-06-09 | Disposition: A | Discharge status: Home / Self Care | Payer: Medicare Other | Source: Ambulatory Visit | Attending: Cardiology | Admitting: Cardiology

## 2012-06-09 DIAGNOSIS — R9431 Abnormal electrocardiogram [ECG] [EKG]: Secondary | ICD-10-CM | POA: Insufficient documentation

## 2012-06-09 DIAGNOSIS — I498 Other specified cardiac arrhythmias: Secondary | ICD-10-CM | POA: Diagnosis not present

## 2012-06-09 NOTE — Progress Notes (Signed)
[name] complained of having chest pain on the treadmill 4/10.  Exercise stopped. Patient taken into the treatment room.  Oxygen placed on at 4L/min. Chest pain was reduced to 2/10 then a 0/10.  Blood pressure 114/62.  Telemetry rhythm Sinus rhythm.  12 lead ECG obtained and faxed to Dr Norris Cross office for review. Dr Mayford Knife reviewed 12 lead ECG, ECG tracings and exercise flow sheets from cardiac rehab. Dr Mayford Knife said that [name] is okay to go home and may return to cardiac rehab on Monday as long as she is not having any pain. [name] will follow up with Dr Mayford Knife in the office next week.

## 2012-06-12 ENCOUNTER — Encounter (HOSPITAL_COMMUNITY): Payer: Medicare Other

## 2012-06-12 DIAGNOSIS — I2510 Atherosclerotic heart disease of native coronary artery without angina pectoris: Secondary | ICD-10-CM | POA: Diagnosis not present

## 2012-06-12 DIAGNOSIS — I10 Essential (primary) hypertension: Secondary | ICD-10-CM | POA: Diagnosis not present

## 2012-06-12 DIAGNOSIS — E7800 Pure hypercholesterolemia, unspecified: Secondary | ICD-10-CM | POA: Diagnosis not present

## 2012-06-12 DIAGNOSIS — I209 Angina pectoris, unspecified: Secondary | ICD-10-CM | POA: Diagnosis not present

## 2012-06-14 ENCOUNTER — Encounter (HOSPITAL_COMMUNITY)
Admission: RE | Admit: 2012-06-14 | Discharge: 2012-06-14 | Disposition: A | Discharge status: Home / Self Care | Payer: Medicare Other | Source: Ambulatory Visit | Attending: Cardiology | Admitting: Cardiology

## 2012-06-14 ENCOUNTER — Telehealth: Payer: Self-pay | Admitting: Internal Medicine

## 2012-06-14 DIAGNOSIS — Z9861 Coronary angioplasty status: Secondary | ICD-10-CM | POA: Diagnosis not present

## 2012-06-14 DIAGNOSIS — I200 Unstable angina: Secondary | ICD-10-CM | POA: Insufficient documentation

## 2012-06-14 DIAGNOSIS — Z5189 Encounter for other specified aftercare: Secondary | ICD-10-CM | POA: Insufficient documentation

## 2012-06-14 DIAGNOSIS — I2510 Atherosclerotic heart disease of native coronary artery without angina pectoris: Secondary | ICD-10-CM | POA: Insufficient documentation

## 2012-06-14 NOTE — Progress Notes (Signed)
Bettina returned to cardiac rehab today per Dr Mayford Knife. Mahagony's Imdur was increased to 90 mg once a day. Will continue to monitor the patient throughout  the program.

## 2012-06-16 ENCOUNTER — Telehealth: Payer: Self-pay

## 2012-06-16 ENCOUNTER — Encounter (HOSPITAL_COMMUNITY): Payer: Medicare Other

## 2012-06-16 NOTE — Telephone Encounter (Signed)
Patient's husband called back to tell me he called several pharmacies regarding the Protonix.  He also called the original pharmacy, [facility], and found out they just received a shipment and will be able to fill the original rx.

## 2012-06-16 NOTE — Telephone Encounter (Signed)
Spoke with patient and her husband about alternate PPI's in light of the Protonix shortage.  I advised him to call around to different drug stores to see if they had Protonix.  If he can't find any, he is going to call me back to discuss another PPI.

## 2012-06-19 ENCOUNTER — Encounter (HOSPITAL_COMMUNITY)
Admission: RE | Admit: 2012-06-19 | Discharge: 2012-06-19 | Disposition: A | Discharge status: Home / Self Care | Payer: Medicare Other | Source: Ambulatory Visit | Attending: Cardiology | Admitting: Cardiology

## 2012-06-21 ENCOUNTER — Encounter (HOSPITAL_COMMUNITY)
Admission: RE | Admit: 2012-06-21 | Discharge: 2012-06-21 | Disposition: A | Discharge status: Home / Self Care | Payer: Medicare Other | Source: Ambulatory Visit | Attending: Cardiology | Admitting: Cardiology

## 2012-06-23 ENCOUNTER — Encounter (HOSPITAL_COMMUNITY)
Admission: RE | Admit: 2012-06-23 | Discharge: 2012-06-23 | Disposition: A | Discharge status: Home / Self Care | Payer: Medicare Other | Source: Ambulatory Visit | Attending: Cardiology | Admitting: Cardiology

## 2012-06-26 ENCOUNTER — Encounter (HOSPITAL_COMMUNITY)
Admission: RE | Admit: 2012-06-26 | Discharge: 2012-06-26 | Disposition: A | Discharge status: Home / Self Care | Payer: Medicare Other | Source: Ambulatory Visit | Attending: Cardiology | Admitting: Cardiology

## 2012-06-28 ENCOUNTER — Encounter (HOSPITAL_COMMUNITY)
Admission: RE | Admit: 2012-06-28 | Discharge: 2012-06-28 | Disposition: A | Discharge status: Home / Self Care | Payer: Medicare Other | Source: Ambulatory Visit | Attending: Cardiology | Admitting: Cardiology

## 2012-06-30 ENCOUNTER — Encounter (HOSPITAL_COMMUNITY)
Admission: RE | Admit: 2012-06-30 | Discharge: 2012-06-30 | Disposition: A | Discharge status: Home / Self Care | Payer: Medicare Other | Source: Ambulatory Visit | Attending: Cardiology | Admitting: Cardiology

## 2012-07-03 ENCOUNTER — Encounter (HOSPITAL_COMMUNITY)
Admission: RE | Admit: 2012-07-03 | Discharge: 2012-07-03 | Disposition: A | Discharge status: Home / Self Care | Payer: Medicare Other | Source: Ambulatory Visit | Attending: Cardiology | Admitting: Cardiology

## 2012-07-05 ENCOUNTER — Encounter (HOSPITAL_COMMUNITY)
Admission: RE | Admit: 2012-07-05 | Discharge: 2012-07-05 | Disposition: A | Discharge status: Home / Self Care | Payer: Medicare Other | Source: Ambulatory Visit | Attending: Cardiology | Admitting: Cardiology

## 2012-07-07 ENCOUNTER — Encounter (HOSPITAL_COMMUNITY)
Admission: RE | Admit: 2012-07-07 | Discharge: 2012-07-07 | Disposition: A | Discharge status: Home / Self Care | Payer: Medicare Other | Source: Ambulatory Visit | Attending: Cardiology | Admitting: Cardiology

## 2012-07-10 ENCOUNTER — Encounter (HOSPITAL_COMMUNITY): Payer: Medicare Other

## 2012-07-12 ENCOUNTER — Encounter (HOSPITAL_COMMUNITY)
Admission: RE | Admit: 2012-07-12 | Discharge: 2012-07-12 | Disposition: A | Discharge status: Home / Self Care | Payer: Medicare Other | Source: Ambulatory Visit | Attending: Cardiology | Admitting: Cardiology

## 2012-07-12 DIAGNOSIS — I200 Unstable angina: Secondary | ICD-10-CM | POA: Insufficient documentation

## 2012-07-12 DIAGNOSIS — Z5189 Encounter for other specified aftercare: Secondary | ICD-10-CM | POA: Diagnosis not present

## 2012-07-12 DIAGNOSIS — I2510 Atherosclerotic heart disease of native coronary artery without angina pectoris: Secondary | ICD-10-CM | POA: Diagnosis not present

## 2012-07-12 DIAGNOSIS — Z9861 Coronary angioplasty status: Secondary | ICD-10-CM | POA: Insufficient documentation

## 2012-07-14 ENCOUNTER — Encounter (HOSPITAL_COMMUNITY): Payer: Medicare Other

## 2012-07-17 ENCOUNTER — Encounter (HOSPITAL_COMMUNITY)
Admission: RE | Admit: 2012-07-17 | Discharge: 2012-07-17 | Disposition: A | Discharge status: Home / Self Care | Payer: Medicare Other | Source: Ambulatory Visit | Attending: Cardiology | Admitting: Cardiology

## 2012-07-19 ENCOUNTER — Encounter (HOSPITAL_COMMUNITY)
Admission: RE | Admit: 2012-07-19 | Discharge: 2012-07-19 | Disposition: A | Discharge status: Home / Self Care | Payer: Medicare Other | Source: Ambulatory Visit | Attending: Cardiology | Admitting: Cardiology

## 2012-07-21 ENCOUNTER — Encounter (HOSPITAL_COMMUNITY): Payer: Medicare Other

## 2012-07-24 ENCOUNTER — Encounter (HOSPITAL_COMMUNITY)
Admission: RE | Admit: 2012-07-24 | Discharge: 2012-07-24 | Disposition: A | Discharge status: Home / Self Care | Payer: Medicare Other | Source: Ambulatory Visit | Attending: Cardiology | Admitting: Cardiology

## 2012-07-24 DIAGNOSIS — E7800 Pure hypercholesterolemia, unspecified: Secondary | ICD-10-CM | POA: Diagnosis not present

## 2012-07-24 DIAGNOSIS — I2510 Atherosclerotic heart disease of native coronary artery without angina pectoris: Secondary | ICD-10-CM | POA: Diagnosis not present

## 2012-07-24 DIAGNOSIS — E039 Hypothyroidism, unspecified: Secondary | ICD-10-CM | POA: Diagnosis not present

## 2012-07-24 DIAGNOSIS — F329 Major depressive disorder, single episode, unspecified: Secondary | ICD-10-CM | POA: Diagnosis not present

## 2012-07-24 DIAGNOSIS — I10 Essential (primary) hypertension: Secondary | ICD-10-CM | POA: Diagnosis not present

## 2012-07-26 ENCOUNTER — Encounter (HOSPITAL_COMMUNITY): Payer: Medicare Other

## 2012-07-28 ENCOUNTER — Encounter (HOSPITAL_COMMUNITY)
Admission: RE | Admit: 2012-07-28 | Discharge: 2012-07-28 | Disposition: A | Discharge status: Home / Self Care | Payer: Medicare Other | Source: Ambulatory Visit | Attending: Cardiology | Admitting: Cardiology

## 2012-07-31 ENCOUNTER — Encounter (HOSPITAL_COMMUNITY)
Admission: RE | Admit: 2012-07-31 | Discharge: 2012-07-31 | Disposition: A | Discharge status: Home / Self Care | Payer: Medicare Other | Source: Ambulatory Visit | Attending: Cardiology | Admitting: Cardiology

## 2012-08-02 ENCOUNTER — Encounter (HOSPITAL_COMMUNITY)
Admission: RE | Admit: 2012-08-02 | Discharge: 2012-08-02 | Disposition: A | Discharge status: Home / Self Care | Payer: Medicare Other | Source: Ambulatory Visit | Attending: Cardiology | Admitting: Cardiology

## 2012-08-03 ENCOUNTER — Other Ambulatory Visit: Payer: Self-pay

## 2012-08-03 DIAGNOSIS — D485 Neoplasm of uncertain behavior of skin: Secondary | ICD-10-CM | POA: Diagnosis not present

## 2012-08-04 ENCOUNTER — Encounter (HOSPITAL_COMMUNITY)
Admission: RE | Admit: 2012-08-04 | Discharge: 2012-08-04 | Disposition: A | Discharge status: Home / Self Care | Payer: Medicare Other | Source: Ambulatory Visit | Attending: Cardiology | Admitting: Cardiology

## 2012-08-07 ENCOUNTER — Encounter (HOSPITAL_COMMUNITY)
Admission: RE | Admit: 2012-08-07 | Discharge: 2012-08-07 | Disposition: A | Discharge status: Home / Self Care | Payer: Medicare Other | Source: Ambulatory Visit | Attending: Cardiology | Admitting: Cardiology

## 2012-08-08 DIAGNOSIS — M1710 Unilateral primary osteoarthritis, unspecified knee: Secondary | ICD-10-CM | POA: Diagnosis not present

## 2012-08-09 ENCOUNTER — Encounter (HOSPITAL_COMMUNITY)
Admission: RE | Admit: 2012-08-09 | Discharge: 2012-08-09 | Disposition: A | Discharge status: Home / Self Care | Payer: Medicare Other | Source: Ambulatory Visit | Attending: Cardiology | Admitting: Cardiology

## 2012-08-28 DIAGNOSIS — E7800 Pure hypercholesterolemia, unspecified: Secondary | ICD-10-CM | POA: Diagnosis not present

## 2012-09-04 DIAGNOSIS — M542 Cervicalgia: Secondary | ICD-10-CM | POA: Diagnosis not present

## 2012-09-04 DIAGNOSIS — M5137 Other intervertebral disc degeneration, lumbosacral region: Secondary | ICD-10-CM | POA: Diagnosis not present

## 2012-09-06 ENCOUNTER — Ambulatory Visit: Payer: Self-pay | Admitting: Nurse Practitioner

## 2012-09-13 DIAGNOSIS — I10 Essential (primary) hypertension: Secondary | ICD-10-CM | POA: Diagnosis not present

## 2012-09-13 DIAGNOSIS — I498 Other specified cardiac arrhythmias: Secondary | ICD-10-CM | POA: Diagnosis not present

## 2012-09-13 DIAGNOSIS — I2510 Atherosclerotic heart disease of native coronary artery without angina pectoris: Secondary | ICD-10-CM | POA: Diagnosis not present

## 2012-09-13 DIAGNOSIS — E785 Hyperlipidemia, unspecified: Secondary | ICD-10-CM | POA: Diagnosis not present

## 2012-09-26 DIAGNOSIS — M542 Cervicalgia: Secondary | ICD-10-CM | POA: Diagnosis not present

## 2012-09-26 DIAGNOSIS — M545 Low back pain: Secondary | ICD-10-CM | POA: Diagnosis not present

## 2012-10-02 DIAGNOSIS — Z23 Encounter for immunization: Secondary | ICD-10-CM | POA: Diagnosis not present

## 2012-10-16 DIAGNOSIS — M5137 Other intervertebral disc degeneration, lumbosacral region: Secondary | ICD-10-CM | POA: Diagnosis not present

## 2012-10-16 DIAGNOSIS — M542 Cervicalgia: Secondary | ICD-10-CM | POA: Diagnosis not present

## 2012-10-23 ENCOUNTER — Observation Stay (HOSPITAL_COMMUNITY)
Admission: EM | Admit: 2012-10-23 | Discharge: 2012-10-24 | Disposition: A | Discharge status: Home / Self Care | Payer: Medicare Other | Attending: Cardiology | Admitting: Cardiology

## 2012-10-23 ENCOUNTER — Telehealth: Payer: Self-pay | Admitting: Cardiology

## 2012-10-23 ENCOUNTER — Emergency Department (HOSPITAL_COMMUNITY): Payer: Medicare Other

## 2012-10-23 ENCOUNTER — Encounter (HOSPITAL_COMMUNITY): Payer: Self-pay | Admitting: Emergency Medicine

## 2012-10-23 DIAGNOSIS — K2970 Gastritis, unspecified, without bleeding: Secondary | ICD-10-CM | POA: Insufficient documentation

## 2012-10-23 DIAGNOSIS — Z862 Personal history of diseases of the blood and blood-forming organs and certain disorders involving the immune mechanism: Secondary | ICD-10-CM | POA: Diagnosis not present

## 2012-10-23 DIAGNOSIS — IMO0001 Reserved for inherently not codable concepts without codable children: Secondary | ICD-10-CM | POA: Diagnosis not present

## 2012-10-23 DIAGNOSIS — R110 Nausea: Secondary | ICD-10-CM | POA: Insufficient documentation

## 2012-10-23 DIAGNOSIS — I2510 Atherosclerotic heart disease of native coronary artery without angina pectoris: Secondary | ICD-10-CM | POA: Diagnosis not present

## 2012-10-23 DIAGNOSIS — J984 Other disorders of lung: Secondary | ICD-10-CM | POA: Diagnosis not present

## 2012-10-23 DIAGNOSIS — E785 Hyperlipidemia, unspecified: Secondary | ICD-10-CM | POA: Diagnosis not present

## 2012-10-23 DIAGNOSIS — F411 Generalized anxiety disorder: Secondary | ICD-10-CM | POA: Diagnosis not present

## 2012-10-23 DIAGNOSIS — E039 Hypothyroidism, unspecified: Secondary | ICD-10-CM | POA: Diagnosis not present

## 2012-10-23 DIAGNOSIS — Z9861 Coronary angioplasty status: Secondary | ICD-10-CM | POA: Insufficient documentation

## 2012-10-23 DIAGNOSIS — F329 Major depressive disorder, single episode, unspecified: Secondary | ICD-10-CM | POA: Diagnosis not present

## 2012-10-23 DIAGNOSIS — I252 Old myocardial infarction: Secondary | ICD-10-CM | POA: Diagnosis not present

## 2012-10-23 DIAGNOSIS — I200 Unstable angina: Secondary | ICD-10-CM | POA: Diagnosis not present

## 2012-10-23 DIAGNOSIS — K224 Dyskinesia of esophagus: Secondary | ICD-10-CM | POA: Diagnosis not present

## 2012-10-23 DIAGNOSIS — K279 Peptic ulcer, site unspecified, unspecified as acute or chronic, without hemorrhage or perforation: Secondary | ICD-10-CM | POA: Insufficient documentation

## 2012-10-23 DIAGNOSIS — K589 Irritable bowel syndrome without diarrhea: Secondary | ICD-10-CM | POA: Diagnosis not present

## 2012-10-23 DIAGNOSIS — F32A Depression, unspecified: Secondary | ICD-10-CM | POA: Diagnosis present

## 2012-10-23 DIAGNOSIS — F3289 Other specified depressive episodes: Secondary | ICD-10-CM | POA: Insufficient documentation

## 2012-10-23 DIAGNOSIS — G40909 Epilepsy, unspecified, not intractable, without status epilepticus: Secondary | ICD-10-CM | POA: Diagnosis not present

## 2012-10-23 DIAGNOSIS — R079 Chest pain, unspecified: Secondary | ICD-10-CM | POA: Diagnosis not present

## 2012-10-23 DIAGNOSIS — I10 Essential (primary) hypertension: Secondary | ICD-10-CM | POA: Diagnosis present

## 2012-10-23 DIAGNOSIS — R011 Cardiac murmur, unspecified: Secondary | ICD-10-CM | POA: Insufficient documentation

## 2012-10-23 DIAGNOSIS — K219 Gastro-esophageal reflux disease without esophagitis: Secondary | ICD-10-CM | POA: Diagnosis present

## 2012-10-23 DIAGNOSIS — R0602 Shortness of breath: Secondary | ICD-10-CM | POA: Diagnosis not present

## 2012-10-23 DIAGNOSIS — Z87891 Personal history of nicotine dependence: Secondary | ICD-10-CM | POA: Insufficient documentation

## 2012-10-23 DIAGNOSIS — M129 Arthropathy, unspecified: Secondary | ICD-10-CM | POA: Insufficient documentation

## 2012-10-23 LAB — BASIC METABOLIC PANEL
BUN: 19 mg/dL (ref 6–23)
CO2: 27 mEq/L (ref 19–32)
Calcium: 9.7 mg/dL (ref 8.4–10.5)
GFR calc non Af Amer: 46 mL/min — ABNORMAL LOW (ref >90)
Glucose, Bld: 104 mg/dL — ABNORMAL HIGH (ref 70–99)
Sodium: 141 mEq/L (ref 135–145)

## 2012-10-23 LAB — CBC
HCT: 34.8 % — ABNORMAL LOW (ref 36.0–46.0)
Hemoglobin: 11.7 g/dL — ABNORMAL LOW (ref 12.0–15.0)
MCH: 29.8 pg (ref 26.0–34.0)
MCHC: 33.6 g/dL (ref 30.0–36.0)
RBC: 3.93 MIL/uL (ref 3.87–5.11)

## 2012-10-23 LAB — POCT I-STAT TROPONIN I

## 2012-10-23 MED ORDER — HEPARIN SODIUM (PORCINE) 5000 UNIT/ML IJ SOLN: 60.0000 [IU]/kg | Freq: Once | INTRAMUSCULAR | Status: DC

## 2012-10-23 MED ORDER — VITAMIN E 180 MG (400 UNIT) PO CAPS
800.0000 [IU] | ORAL_CAPSULE | Freq: Every day | ORAL | Status: DC
  Administered 2012-10-24: 800 [IU] via ORAL
  Filled 2012-10-23: qty 2

## 2012-10-23 MED ORDER — CLOPIDOGREL BISULFATE 75 MG PO TABS
75.0000 mg | ORAL_TABLET | Freq: Every day | ORAL | Status: DC
  Administered 2012-10-24: 75 mg via ORAL
  Filled 2012-10-23: qty 1

## 2012-10-23 MED ORDER — LOSARTAN POTASSIUM 50 MG PO TABS
100.0000 mg | ORAL_TABLET | Freq: Every day | ORAL | Status: DC
  Administered 2012-10-24: 100 mg via ORAL
  Filled 2012-10-23: qty 2

## 2012-10-23 MED ORDER — NITROGLYCERIN IN D5W 200-5 MCG/ML-% IV SOLN
2.0000 ug/min | Freq: Once | INTRAVENOUS | Status: AC
  Administered 2012-10-23: 10 ug/min via INTRAVENOUS
  Filled 2012-10-23: qty 250

## 2012-10-23 MED ORDER — NITROGLYCERIN IN D5W 200-5 MCG/ML-% IV SOLN: 2.0000 ug/min | INTRAVENOUS | Status: DC

## 2012-10-23 MED ORDER — HEPARIN (PORCINE) IN NACL 100-0.45 UNIT/ML-% IJ SOLN: 12.0000 [IU]/kg/h | INTRAMUSCULAR | Status: DC

## 2012-10-23 MED ORDER — DOCUSATE SODIUM 100 MG PO CAPS
100.0000 mg | ORAL_CAPSULE | Freq: Every day | ORAL | Status: DC
  Administered 2012-10-24: 100 mg via ORAL
  Filled 2012-10-23 (×2): qty 1

## 2012-10-23 MED ORDER — DOXAZOSIN MESYLATE 2 MG PO TABS
2.0000 mg | ORAL_TABLET | Freq: Every day | ORAL | Status: DC
  Administered 2012-10-24: 2 mg via ORAL
  Filled 2012-10-23: qty 1

## 2012-10-23 MED ORDER — NITROGLYCERIN 0.4 MG SL SUBL: 0.4000 mg | SUBLINGUAL_TABLET | SUBLINGUAL | Status: DC | PRN

## 2012-10-23 MED ORDER — HEPARIN BOLUS VIA INFUSION
3500.0000 [IU] | Freq: Once | INTRAVENOUS | Status: AC
  Administered 2012-10-23: 3500 [IU] via INTRAVENOUS
  Filled 2012-10-23: qty 3500

## 2012-10-23 MED ORDER — ACETAMINOPHEN 325 MG PO TABS: 650.0000 mg | ORAL_TABLET | ORAL | Status: DC | PRN

## 2012-10-23 MED ORDER — ASPIRIN 81 MG PO CHEW
324.0000 mg | CHEWABLE_TABLET | Freq: Once | ORAL | Status: AC
  Administered 2012-10-23: 324 mg via ORAL
  Filled 2012-10-23: qty 4

## 2012-10-23 MED ORDER — METOCLOPRAMIDE HCL 5 MG/ML IJ SOLN
10.0000 mg | Freq: Once | INTRAMUSCULAR | Status: AC
  Administered 2012-10-23: 10 mg via INTRAVENOUS
  Filled 2012-10-23: qty 2

## 2012-10-23 MED ORDER — ACETAMINOPHEN 500 MG PO TABS
1000.0000 mg | ORAL_TABLET | Freq: Once | ORAL | Status: AC
  Administered 2012-10-23: 1000 mg via ORAL
  Filled 2012-10-23: qty 2

## 2012-10-23 MED ORDER — NITROGLYCERIN 0.4 MG SL SUBL
0.4000 mg | SUBLINGUAL_TABLET | SUBLINGUAL | Status: DC | PRN
Start: 2012-10-23 — End: 2012-10-24

## 2012-10-23 MED ORDER — ONDANSETRON HCL 4 MG/2ML IJ SOLN: 4.0000 mg | Freq: Four times a day (QID) | INTRAMUSCULAR | Status: DC | PRN

## 2012-10-23 MED ORDER — MORPHINE SULFATE 4 MG/ML IJ SOLN
6.0000 mg | Freq: Once | INTRAMUSCULAR | Status: AC
  Administered 2012-10-23: 6 mg via INTRAVENOUS
  Filled 2012-10-23: qty 2

## 2012-10-23 MED ORDER — POTASSIUM CHLORIDE CRYS ER 20 MEQ PO TBCR
20.0000 meq | EXTENDED_RELEASE_TABLET | Freq: Two times a day (BID) | ORAL | Status: DC
  Administered 2012-10-24 (×2): 20 meq via ORAL
  Filled 2012-10-23 (×3): qty 1

## 2012-10-23 MED ORDER — PAROXETINE HCL 30 MG PO TABS
30.0000 mg | ORAL_TABLET | Freq: Every day | ORAL | Status: DC
  Administered 2012-10-24: 30 mg via ORAL
  Filled 2012-10-23: qty 1

## 2012-10-23 MED ORDER — HEPARIN (PORCINE) IN NACL 100-0.45 UNIT/ML-% IJ SOLN
700.0000 [IU]/h | INTRAMUSCULAR | Status: DC
  Administered 2012-10-23: 700 [IU]/h via INTRAVENOUS
  Filled 2012-10-23: qty 250

## 2012-10-23 MED ORDER — DILTIAZEM HCL ER COATED BEADS 240 MG PO TB24
240.0000 mg | ORAL_TABLET | Freq: Every day | ORAL | Status: DC
  Administered 2012-10-24: 240 mg via ORAL
  Filled 2012-10-23: qty 1

## 2012-10-23 MED ORDER — ASPIRIN EC 81 MG PO TBEC
81.0000 mg | DELAYED_RELEASE_TABLET | Freq: Every day | ORAL | Status: DC
  Administered 2012-10-24: 81 mg via ORAL
  Filled 2012-10-23: qty 1

## 2012-10-23 MED ORDER — PANTOPRAZOLE SODIUM 40 MG PO TBEC
40.0000 mg | DELAYED_RELEASE_TABLET | Freq: Two times a day (BID) | ORAL | Status: DC
  Administered 2012-10-24 (×2): 40 mg via ORAL
  Filled 2012-10-23 (×2): qty 1

## 2012-10-23 MED ORDER — LEVOTHYROXINE SODIUM 100 MCG PO TABS
100.0000 ug | ORAL_TABLET | Freq: Every day | ORAL | Status: DC
  Administered 2012-10-24: 100 ug via ORAL
  Filled 2012-10-23 (×2): qty 1

## 2012-10-23 MED ORDER — CALCIUM CARBONATE-VITAMIN D 250-125 MG-UNIT PO TABS
1.0000 | ORAL_TABLET | Freq: Every day | ORAL | Status: DC
  Filled 2012-10-23: qty 1

## 2012-10-23 MED ORDER — METOPROLOL SUCCINATE ER 25 MG PO TB24
25.0000 mg | ORAL_TABLET | Freq: Two times a day (BID) | ORAL | Status: DC
  Administered 2012-10-24 (×2): 25 mg via ORAL
  Filled 2012-10-23 (×3): qty 1

## 2012-10-23 MED ORDER — ALPRAZOLAM 0.25 MG PO TABS: 0.2500 mg | ORAL_TABLET | Freq: Every evening | ORAL | Status: DC | PRN

## 2012-10-23 MED ORDER — CALCITRIOL 0.5 MCG PO CAPS
0.5000 ug | ORAL_CAPSULE | Freq: Every day | ORAL | Status: DC
  Administered 2012-10-24: 0.5 ug via ORAL
  Filled 2012-10-23: qty 1

## 2012-10-23 MED ORDER — LEVETIRACETAM 500 MG PO TABS
500.0000 mg | ORAL_TABLET | Freq: Two times a day (BID) | ORAL | Status: DC
  Administered 2012-10-24 (×2): 500 mg via ORAL
  Filled 2012-10-23 (×3): qty 1

## 2012-10-23 MED ORDER — ATORVASTATIN CALCIUM 20 MG PO TABS
20.0000 mg | ORAL_TABLET | Freq: Every day | ORAL | Status: DC
  Filled 2012-10-23: qty 1

## 2012-10-23 NOTE — Telephone Encounter (Signed)
Patient went to E 10/13

## 2012-10-23 NOTE — ED Notes (Signed)
Patient presents with c/o CP since last week.  States it hasn't gotten any better and today really started with SOB.  Has history of 3 stents Becomes SOB when talking

## 2012-10-23 NOTE — ED Notes (Signed)
XR at bedside

## 2012-10-23 NOTE — ED Notes (Signed)
Called report to [place], RN unit 3W.

## 2012-10-23 NOTE — Progress Notes (Signed)
ANTICOAGULATION CONSULT NOTE - Initial Consult  Pharmacy Consult for heparin Indication: chest pain/ACS  No Known Allergies  Patient Measurements: weight 68 kg, height 59 inches (per patient)   Heparin Dosing Weight: 60kg  Vital Signs: BP: 159/74 mmHg (10/13 1930) Pulse Rate: 54 (10/13 1930)  Labs:  Recent Labs  10/23/12 1835  HGB 11.7*  HCT 34.8*  PLT 199  CREATININE 1.20*    The CrCl is unknown because both a height and weight (above a minimum accepted value) are required for this calculation.   Medical History: Past Medical History  Diagnosis Date  . Irritable bowel syndrome   . Esophageal reflux   . Dysphagia, unspecified(787.20)   . Hypertension   . Depression   . Hypothyroidism   . Seizures   . Iron deficiency anemia   . Internal hemorrhoids   . Coronary artery disease     coronary angiogram on 03/14/12 showed Normal left main coronary artery, 50% proximal and 70% mid stenosis of the LAD and distally at takeoff of second diagonal there is a 50-70% stenosis in a very tortuous LAD, 70-80% ostial stenosis of the left circumflex in a small vessel, and 50% instent restenosis of the mid to distal portion of the RCA stent and 50-70% stenosis of the ostial PDA.   Marland Kitchen Anxiety   . Arthritis   . Blood transfusion 1960  . Heart murmur   . Hyperlipidemia   . Myocardial infarction 11/2009    1 stent placed  . Esophageal stricture   . Hiatal hernia     Medications:  See med rec  Assessment: Patient is a 67 y.o F with hx of CAD presented to the ED with c/o CP.  To start heparin for r/o ACS.   Goal of Therapy:  Heparin level 0.3-0.7 units/ml Monitor platelets by anticoagulation protocol: Yes   Plan:  1) heparin 3500 units IV bolus x1, then drip at 700 units/hr 2) check 6 hour heparin level  Gervase Colberg P 10/23/2012,8:40 PM

## 2012-10-23 NOTE — H&P (Signed)
[name] is an 67 y.o. female.   Chief Complaint: Chest Pain Primary Cardiologist: Dr. Eldridge Dace  HPI: [name] is a 67 yo woman with PMH of CAD, last known LHC 03/14, hypertension, depression, dyslipidemia, hypothyroidism here with left-sided chest pain, sharp sensation similar to prior pain in March that has been going on/off since Thursday but especially since eating at noon today. She actually was riding her exercise bicycle approximately 3 miles this morning without difficulty however around 12:30 she had some CP and it was persistent. CP was left central, sharp sensation without radiation, similar to previous pain in March. She occasional has CP with deep breath but this has happened along with SOB for years she tells me and currently does not have CP with deep breath. She has no nausea or vomiting, no diarrhea, no dark stool or bright red blood per rectum. She has been relatively CP free from March until Thursday 4 days ago. In the ER she had negative initial troponin POC, sinus bradycardia on ECG and NTG gtt brought her CP down from 8 to 3 prior to Cardiology consultation for admission. She had 324 mg asa in ER and took her plavix 75 mg already today. Her LHC from 03/14/12 demonstrated normal left main, 50% prox and 70% mid LAD, 50-70% D2 at takeoff, very tortuous LAD overall, 70-80% ostial LCx (small), 50% instent restenosis of LCx (small), and 50% instent restenosis of mid to distal RCA stent and 50-70% ostial PDA stenosis.    Currently, she's CP free after NTG titrated up and heparin started.   Past Medical History  Diagnosis Date  . Irritable bowel syndrome   . Esophageal reflux   . Dysphagia, unspecified(787.20)   . Hypertension   . Depression   . Hypothyroidism   . Seizures   . Iron deficiency anemia   . Internal hemorrhoids   . Coronary artery disease     coronary angiogram on 03/14/12 showed Normal left main coronary artery, 50% proximal and 70% mid stenosis of the LAD and  distally at takeoff of second diagonal there is a 50-70% stenosis in a very tortuous LAD, 70-80% ostial stenosis of the left circumflex in a small vessel, and 50% instent restenosis of the mid to distal portion of the RCA stent and 50-70% stenosis of the ostial PDA.   Marland Kitchen Anxiety   . Arthritis   . Blood transfusion 1960  . Heart murmur   . Hyperlipidemia   . Myocardial infarction 11/2009    1 stent placed  . Esophageal stricture   . Hiatal hernia     Past Surgical History  Procedure Laterality Date  . Breast surgery  2010     benign cysts  . Coronary angioplasty with stent placement  2011 and 2012    drug-eluting stent w/ ion stents to the distal right coronary artery    Family History  Problem Relation Age of Onset  . Breast cancer Sister   . Breast cancer      niece  . Heart disease Father   . Colon cancer Neg Hx   . Heart attack Mother    Social History:  reports that she has quit smoking. She has never used smokeless tobacco. She reports that she does not drink alcohol or use illicit drugs.  Allergies: No Known Allergies   (Not in a hospital admission)  Results for orders placed during the hospital encounter of 10/23/12 (from the past 48 hour(s))  CBC     Status: Abnormal  Collection Time    10/23/12  6:35 PM      Result Value Range   WBC 6.3  4.0 - 10.5 K/uL   RBC 3.93  3.87 - 5.11 MIL/uL   Hemoglobin 11.7 (*) 12.0 - 15.0 g/dL   HCT 72.5 (*) 36.6 - 44.0 %   MCV 88.5  78.0 - 100.0 fL   MCH 29.8  26.0 - 34.0 pg   MCHC 33.6  30.0 - 36.0 g/dL   RDW 34.7  42.5 - 95.6 %   Platelets 199  150 - 400 K/uL  BASIC METABOLIC PANEL     Status: Abnormal   Collection Time    10/23/12  6:35 PM      Result Value Range   Sodium 141  135 - 145 mEq/L   Potassium 3.7  3.5 - 5.1 mEq/L   Chloride 103  96 - 112 mEq/L   CO2 27  19 - 32 mEq/L   Glucose, Bld 104 (*) 70 - 99 mg/dL   BUN 19  6 - 23 mg/dL   Creatinine, Ser 3.87 (*) 0.50 - 1.10 mg/dL   Calcium 9.7  8.4 - 56.4 mg/dL    GFR calc non Af Amer 46 (*) >90 mL/min   GFR calc Af Amer 53 (*) >90 mL/min   Comment: (NOTE)     The eGFR has been calculated using the CKD EPI equation.     This calculation has not been validated in all clinical situations.     eGFR's persistently <90 mL/min signify possible Chronic Kidney     Disease.  POCT I-STAT TROPONIN I     Status: None   Collection Time    10/23/12  6:38 PM      Result Value Range   Troponin i, poc 0.00  0.00 - 0.08 ng/mL   Comment 3            Comment: Due to the release kinetics of cTnI,     a negative result within the first hours     of the onset of symptoms does not rule out     myocardial infarction with certainty.     If myocardial infarction is still suspected,     repeat the test at appropriate intervals.   Dg Chest Portable 1 View  10/23/2012   CLINICAL DATA:  Chest pain and shortness of breath  EXAM: PORTABLE CHEST - 1 VIEW  COMPARISON:  April 01, 2012  FINDINGS: There is mild scarring in the left lower lobe. Lungs are otherwise clear. Heart is borderline enlarged with normal pulmonary vascularity. No adenopathy. There is atherosclerotic change in the left carotid artery.  IMPRESSION: Left lower lobe scarring, stable. No edema or consolidation. Borderline cardiac enlargement.   Electronically Signed   By: Bretta Bang M.D.   On: 10/23/2012 19:05    Review of Systems  Constitutional: Positive for malaise/fatigue. Negative for fever, chills, weight loss and diaphoresis.  HENT: Negative for ear pain and tinnitus.   Eyes: Negative for double vision, photophobia and pain.  Respiratory: Positive for shortness of breath.        Stable SOB for years; occasionally with deep breath but this is old and not a new finding  Cardiovascular: Positive for chest pain. Negative for palpitations, orthopnea, leg swelling and PND.  Gastrointestinal: Positive for heartburn. Negative for nausea, vomiting, abdominal pain, diarrhea, blood in stool and melena.   Genitourinary: Negative for dysuria and urgency.  Musculoskeletal: Negative for back pain, myalgias and  neck pain.  Skin: Negative for rash.  Neurological: Negative for dizziness, tingling, tremors and headaches.  Endo/Heme/Allergies: Negative for environmental allergies. Does not bruise/bleed easily.  Psychiatric/Behavioral: Negative for suicidal ideas and substance abuse.    Blood pressure 148/80, pulse 57, resp. rate 11, SpO2 99.00%. Physical Exam  Nursing note and vitals reviewed. Constitutional: She is oriented to person, place, and time. She appears well-developed and well-nourished. No distress.  HENT:  Head: Normocephalic and atraumatic.  Nose: Nose normal.  Mouth/Throat: Oropharynx is clear and moist. No oropharyngeal exudate.  Eyes: Conjunctivae and EOM are normal. No scleral icterus.  Neck: Normal range of motion. Neck supple. No JVD present. No tracheal deviation present. No thyromegaly present.  Cardiovascular: Normal rate, regular rhythm, normal heart sounds and intact distal pulses.  Exam reveals no gallop.   No murmur heard. Respiratory: Effort normal and breath sounds normal. No respiratory distress. She has no wheezes.  GI: Soft. Bowel sounds are normal. She exhibits no distension. There is no tenderness. There is no rebound.  Musculoskeletal: Normal range of motion. She exhibits no edema and no tenderness.  Neurological: She is alert and oriented to person, place, and time. No cranial nerve deficit. Coordination normal.  Skin: Skin is warm and dry. No rash noted. She is not diaphoretic. No erythema.  Psychiatric: She has a normal mood and affect. Her behavior is normal. Thought content normal.    Labs reviewed; h/h 11.7/34.8, plt 199, wbc 6.3, na 141, K 3.7, bun/cr 19/1.2 Troponin POC 0.00 Chest x-ray unrevealing ECG HR 59, sinus, LAD, anterior q waves 11/13 Echo results reviewed; nl WM, EF 55-65%  Problem List Unstable Angina/Chest Pain Known Coronary artery  disease Hypertension Dyslipidemia Hypothyroidism Depression GERD  Assessment/Plan Ms. Telfair is a 67 yo woman with PMH of CAD, hypertension, dyslipidemia, hypothyroidism, depression here with chest pain concerning for unstable angina. Differential diagnosis is esophageal spasm, musculoskeletal/pain/costochondritis, PUD, pleurisy among other etiologies. Given known CAD, significant instent restenosis previously and similar prior chest pain, I favor diagnosis of unstable angina or potentially NSTEMI. She was started on heparin gtt and nitroglycerin in ER. Her CP resolved gradually with these medications. We will keep her NPO for potential and likely LHC in AM. I have discussed the findings and likely plan with Ms. Docter.   - on heparin gtt; trending cardiac biomarkers (troponins), on telemetry - already received large aspirin and regular dose plavix today - update tsh, BNP - NPO after MN for potential LHC in AM; should symptoms return, consider LHC urgently - continue PPI - on toprol XL 25 mg bid, losartan 100 mg daily, diltiazem 240 mg q daily - on asa 81 mg and plavix 75 mg daily Alana Dayton 10/23/2012, 9:37 PM

## 2012-10-23 NOTE — Telephone Encounter (Signed)
Patient states she had previously told Dr. Mayford Knife that the occasional left-sided sharp chest pain episodes had subsided. She is calling back today to inform Dr. Mayford Knife that they have resumed as of last week. She states they are identical to the episodes in the past in that they are occasional through the day (4-5x/day) on a pain scale of 5 out of 10, not correlating with times of rest/exercise/food/medications. She denies radiation of pain, worsening SOB, swelling of LE, tingling or numbness or weakness associated with episodes. She states she does not have recent BP or weight. States she has been continuing her daily exercise plan of stationary bike riding 15" and treadmill walking 10" daily. She would like advisement from Dr. Mayford Knife on the next steps regarding this problem. Routed to Dr. Mayford Knife and Simeon Craft, CMA.  Patient advised to go to ED for any worsening sx of CP or if she develops worsening other symptoms, such as SOB, swelling, dizziness, changes to current pain levels. Patient verbalized agreement and understanding.

## 2012-10-23 NOTE — ED Notes (Signed)
Cardiology MD at bedside.

## 2012-10-23 NOTE — Telephone Encounter (Signed)
New Problem  Pt states that she has experienced several chest pains throughout the week starting 10/8.Marland Kitchen requesting a call back from the nurse to discuss.

## 2012-10-23 NOTE — ED Provider Notes (Signed)
Presents with anterior chest pain typical of "heart pain she's had in the past. She treated herself with one sublingual nitroglycerin at home with no relief. Her pain is improved since arrival here as his treatment with intravenous nitroglycerin drip. And aspirin Presently 5 on a scale of 1-10. Patient took one baby aspirin and took her Plavix this morning.  Doug Sou, MD 10/24/12 [phone]

## 2012-10-23 NOTE — ED Provider Notes (Signed)
CSN: [id number]     Arrival date & time 10/23/12  1811 History   First MD Initiated Contact with Patient 10/23/12 1828     Chief Complaint  Patient presents with  . Chest Pain   (Consider location/radiation/quality/duration/timing/severity/associated sxs/prior Treatment) Patient is a 67 y.o. female presenting with chest pain. The history is provided by the patient.  Chest Pain Pain location:  L chest Pain quality: sharp   Pain radiates to:  Does not radiate Pain radiates to the back: no   Pain severity:  Severe Onset quality:  Gradual Timing:  Constant Progression:  Unchanged Chronicity:  Recurrent Context: at rest   Relieved by:  Nitroglycerin Worsened by:  Exertion Associated symptoms: nausea and shortness of breath   Associated symptoms: no abdominal pain, no anorexia, no back pain, no cough, no dysphagia, no fever, no heartburn, not vomiting and no weakness   Risk factors: coronary artery disease   Risk factors: no diabetes mellitus, no hypertension, no prior DVT/PE and no smoking     Past Medical History  Diagnosis Date  . Irritable bowel syndrome   . Esophageal reflux   . Dysphagia, unspecified(787.20)   . Hypertension   . Depression   . Hypothyroidism   . Seizures   . Iron deficiency anemia   . Internal hemorrhoids   . Coronary artery disease     coronary angiogram on 03/14/12 showed Normal left main coronary artery, 50% proximal and 70% mid stenosis of the LAD and distally at takeoff of second diagonal there is a 50-70% stenosis in a very tortuous LAD, 70-80% ostial stenosis of the left circumflex in a small vessel, and 50% instent restenosis of the mid to distal portion of the RCA stent and 50-70% stenosis of the ostial PDA.   Marland Kitchen Anxiety   . Arthritis   . Blood transfusion 1960  . Heart murmur   . Hyperlipidemia   . Myocardial infarction 11/2009    1 stent placed  . Esophageal stricture   . Hiatal hernia    Past Surgical History  Procedure Laterality Date   . Breast surgery  2010     benign cysts  . Coronary angioplasty with stent placement  2011 and 2012    drug-eluting stent w/ ion stents to the distal right coronary artery   Family History  Problem Relation Age of Onset  . Breast cancer Sister   . Breast cancer      niece  . Heart disease Father   . Colon cancer Neg Hx   . Heart attack Mother    History  Substance Use Topics  . Smoking status: Former Games developer  . Smokeless tobacco: Never Used  . Alcohol Use: No   OB History   Grav Para Term Preterm Abortions TAB SAB Ect Mult Living                 Review of Systems  Constitutional: Negative for fever and chills.  HENT: Negative for trouble swallowing.   Respiratory: Positive for shortness of breath. Negative for cough and chest tightness.   Cardiovascular: Positive for chest pain.  Gastrointestinal: Positive for nausea. Negative for heartburn, vomiting, abdominal pain and anorexia.  Musculoskeletal: Negative for back pain.  Skin: Negative for color change, pallor, rash and wound.  Neurological: Negative for weakness.  All other systems reviewed and are negative.    Allergies  Review of patient's allergies indicates no known allergies.  Home Medications   No current outpatient prescriptions on file. BP 139/65  Pulse 64  Temp(Src) 97.7 F (36.5 C) (Oral)  Resp 18  Ht 4\' 11"  (1.499 m)  Wt 148 lb 6.4 oz (67.314 kg)  BMI 29.96 kg/m2  SpO2 95% Physical Exam  Nursing note and vitals reviewed. Constitutional: She is oriented to person, place, and time. She appears well-developed and well-nourished. She appears distressed.  Tearful, in obvious discomfort  HENT:  Head: Normocephalic and atraumatic.  Mouth/Throat: Oropharynx is clear and moist. No oropharyngeal exudate.  Eyes: Conjunctivae and EOM are normal. Pupils are equal, round, and reactive to light.  Neck: Normal range of motion. Neck supple.  Cardiovascular: Regular rhythm and normal heart sounds.   Bradycardia present.  Exam reveals no gallop and no friction rub.   No murmur heard. Pulses:      Radial pulses are 2+ on the right side, and 2+ on the left side.  Pulmonary/Chest: Effort normal and breath sounds normal. No respiratory distress. She has no wheezes. She has no rales. She exhibits no tenderness.  Abdominal: Soft. She exhibits no distension. There is no tenderness.  Musculoskeletal: Normal range of motion. She exhibits no edema and no tenderness.  Lymphadenopathy:    She has no cervical adenopathy.  Neurological: She is alert and oriented to person, place, and time.  Skin: Skin is warm and dry. No rash noted. She is not diaphoretic.  Psychiatric: She has a normal mood and affect. Her behavior is normal. Judgment and thought content normal.    ED Course  Procedures (including critical care time) Labs Review Labs Reviewed  CBC - Abnormal; Notable for the following:    Hemoglobin 11.7 (*)    HCT 34.8 (*)    All other components within normal limits  BASIC METABOLIC PANEL - Abnormal; Notable for the following:    Glucose, Bld 104 (*)    Creatinine, Ser 1.20 (*)    GFR calc non Af Amer 46 (*)    GFR calc Af Amer 53 (*)    All other components within normal limits  CBC  HEPARIN LEVEL (UNFRACTIONATED)  TROPONIN I  TROPONIN I  TROPONIN I  PROTIME-INR  CBC WITH DIFFERENTIAL  TSH  PRO B NATRIURETIC PEPTIDE  HEMOGLOBIN A1C  LIPID PANEL  BASIC METABOLIC PANEL  POCT I-STAT TROPONIN I   Imaging Review Dg Chest Portable 1 View  10/23/2012   CLINICAL DATA:  Chest pain and shortness of breath  EXAM: PORTABLE CHEST - 1 VIEW  COMPARISON:  April 01, 2012  FINDINGS: There is mild scarring in the left lower lobe. Lungs are otherwise clear. Heart is borderline enlarged with normal pulmonary vascularity. No adenopathy. There is atherosclerotic change in the left carotid artery.  IMPRESSION: Left lower lobe scarring, stable. No edema or consolidation. Borderline cardiac enlargement.    Electronically Signed   By: Bretta Bang M.D.   On: 10/23/2012 19:05    EKG Interpretation     Ventricular Rate:    PR Interval:    QRS Duration:   QT Interval:    QTC Calculation:   R Axis:     Text Interpretation:              Date: 10/23/2012  Rate: 59  Rhythm: sinus bradycardia  QRS Axis: left  Intervals: normal  ST/T Wave abnormalities: nonspecific ST changes  Conduction Disutrbances:none  Narrative Interpretation:   Old EKG Reviewed: unchanged    MDM   1. Unstable angina     The patient is a 67 year old female with a history of  hypertension, coronary artery disease status post multiple stents, paroxysmal SVT who presents with chest pain. Patient has had chest pain, left-sided, sharp in nature, waxing and waning since 5 days ago. Pain started approximately 5 hours ago today, and has not resolved. It is associated with shortness of breath as well as nausea. Pain previously has been associated with exertion but today was started and remains at rest.  ACS evaluation based on history and risk factors. Typical pain similar to previous ACS. CXR, EKG, trop, basic labs. Asprin as well as nitro drip to get chest pain free. DDx also includes gastritis, PUD, esophageal spasms, msk pain, however based on risk factors and similar pain feel cardiac most likely.  CXR shows no consolidation, effusion, pneumothorax, or widened mediastinum. Initial trop negative. CBC and BMP unremarkable. Chest pain improving on nitro drip. She is also getting morphine x1 for pain.  Following nitro drip, chest pain free. Consult with cardiology for admission for unstable angina. Heparin bolus and infusion also started. Patient was admitted to the cardiology floor hemodynamically stable and chest pain-free.  Patient was discussed with my attending, Dr. Ethelda Chick.   Dorna Leitz, MD 10/24/12 [phone]

## 2012-10-24 ENCOUNTER — Observation Stay (HOSPITAL_COMMUNITY): Payer: Medicare Other

## 2012-10-24 ENCOUNTER — Encounter (HOSPITAL_COMMUNITY): Payer: Self-pay | Admitting: General Practice

## 2012-10-24 DIAGNOSIS — R079 Chest pain, unspecified: Secondary | ICD-10-CM

## 2012-10-24 DIAGNOSIS — E039 Hypothyroidism, unspecified: Secondary | ICD-10-CM

## 2012-10-24 DIAGNOSIS — I2510 Atherosclerotic heart disease of native coronary artery without angina pectoris: Secondary | ICD-10-CM | POA: Diagnosis not present

## 2012-10-24 DIAGNOSIS — I10 Essential (primary) hypertension: Secondary | ICD-10-CM

## 2012-10-24 LAB — CBC WITH DIFFERENTIAL/PLATELET
Eosinophils Relative: 3 % (ref 0–5)
HCT: 31.8 % — ABNORMAL LOW (ref 36.0–46.0)
Lymphocytes Relative: 47 % — ABNORMAL HIGH (ref 12–46)
Lymphs Abs: 3.1 10*3/uL (ref 0.7–4.0)
MCH: 30.2 pg (ref 26.0–34.0)
MCV: 88.8 fL (ref 78.0–100.0)
Monocytes Absolute: 0.8 10*3/uL (ref 0.1–1.0)
Monocytes Relative: 13 % — ABNORMAL HIGH (ref 3–12)
RBC: 3.58 MIL/uL — ABNORMAL LOW (ref 3.87–5.11)
WBC: 6.5 10*3/uL (ref 4.0–10.5)

## 2012-10-24 LAB — BASIC METABOLIC PANEL
BUN: 21 mg/dL (ref 6–23)
CO2: 27 mEq/L (ref 19–32)
Calcium: 9.1 mg/dL (ref 8.4–10.5)
Chloride: 105 mEq/L (ref 96–112)
Potassium: 4 mEq/L (ref 3.5–5.1)
Sodium: 141 mEq/L (ref 135–145)

## 2012-10-24 LAB — LIPID PANEL
Cholesterol: 149 mg/dL (ref 0–200)
Total CHOL/HDL Ratio: 1.9 RATIO
Triglycerides: 41 mg/dL (ref <150)
VLDL: 8 mg/dL (ref 0–40)

## 2012-10-24 LAB — TROPONIN I
Troponin I: <0.3 ng/mL (ref <0.30)
Troponin I: <0.3 ng/mL (ref <0.30)

## 2012-10-24 LAB — HEPARIN LEVEL (UNFRACTIONATED): Heparin Unfractionated: 0.66 IU/mL (ref 0.30–0.70)

## 2012-10-24 LAB — HEMOGLOBIN A1C: Mean Plasma Glucose: 120 mg/dL — ABNORMAL HIGH (ref <117)

## 2012-10-24 LAB — PROTIME-INR: Prothrombin Time: 14.2 seconds (ref 11.6–15.2)

## 2012-10-24 MED ORDER — HEPARIN SODIUM (PORCINE) 5000 UNIT/ML IJ SOLN
5000.0000 [IU] | Freq: Three times a day (TID) | INTRAMUSCULAR | Status: DC
  Filled 2012-10-24 (×2): qty 1

## 2012-10-24 MED ORDER — REGADENOSON 0.4 MG/5ML IV SOLN
INTRAVENOUS | Status: AC
  Administered 2012-10-24: 0.4 mg via INTRAVENOUS
  Filled 2012-10-24: qty 5

## 2012-10-24 MED ORDER — TECHNETIUM TC 99M SESTAMIBI GENERIC - CARDIOLITE
10.0000 | Freq: Once | INTRAVENOUS | Status: AC | PRN
  Administered 2012-10-24: 10 via INTRAVENOUS

## 2012-10-24 MED ORDER — CALCIUM CARBONATE-VITAMIN D 500-200 MG-UNIT PO TABS
1.0000 | ORAL_TABLET | Freq: Every day | ORAL | Status: DC
  Filled 2012-10-24: qty 1

## 2012-10-24 MED ORDER — REGADENOSON 0.4 MG/5ML IV SOLN
0.4000 mg | Freq: Once | INTRAVENOUS | Status: AC
  Administered 2012-10-24: 0.4 mg via INTRAVENOUS
  Filled 2012-10-24: qty 5

## 2012-10-24 MED ORDER — TECHNETIUM TC 99M SESTAMIBI GENERIC - CARDIOLITE
30.0000 | Freq: Once | INTRAVENOUS | Status: AC | PRN
  Administered 2012-10-24: 30 via INTRAVENOUS

## 2012-10-24 NOTE — Progress Notes (Signed)
ANTICOAGULATION CONSULT NOTE - Follow Up Consult  Pharmacy Consult for heparin Indication: USAP vs NSTEMI  Labs:  Recent Labs  10/23/12 1835 10/24/12 0020 10/24/12 0310  HGB 11.7*  --  10.8*  HCT 34.8*  --  31.8*  PLT 199  --  181  LABPROT  --   --  14.2  INR  --   --  1.12  HEPARINUNFRC  --   --  0.66  CREATININE 1.20*  --   --   TROPONINI  --  <0.30  --     Assessment/Plan:  67yo female therapeutic on heparin with initial dosing for ACS.  Will continue gtt at current rate and confirm stable with additional level.  Vernard Gambles, PharmD, BCPS  10/24/2012,3:58 AM

## 2012-10-24 NOTE — Progress Notes (Signed)
SUBJECTIVE:  Continues to complain of intermittent sharp stabbing CP.  Currently pain free  OBJECTIVE:   Vitals:   Filed Vitals:   10/23/12 2230 10/23/12 2308 10/24/12 0227 10/24/12 0514  BP: 108/59 139/65 96/64 102/49  Pulse: 56 64 54 61  Temp:  97.7 F (36.5 C)  97.6 F (36.4 C)  TempSrc:  Oral  Oral  Resp: 9 18  18   Height:  4\' 11"  (1.499 m)    Weight:  148 lb 6.4 oz (67.314 kg)  148 lb 6.4 oz (67.314 kg)  SpO2: 100% 95% 99% 100%   I&O's:  No intake or output data in the 24 hours ending 10/24/12 1228 TELEMETRY: Reviewed telemetry pt in NSR:     PHYSICAL EXAM General: Well developed, well nourished, in no acute distress Head: Eyes PERRLA, No xanthomas.   Normal cephalic and atramatic  Lungs:   Clear bilaterally to auscultation and percussion. Heart:   HRRR S1 S2 Pulses are 2+ & equal. Abdomen: Bowel sounds are positive, abdomen soft and non-tender without masses  Extremities:   No clubbing, cyanosis or edema.  DP +1 Neuro: Alert and oriented X 3. Psych:  Good affect, responds appropriately   LABS: Basic Metabolic Panel:  Recent Labs  16/10/96 1835 10/24/12 0310  NA 141 141  K 3.7 4.0  CL 103 105  CO2 27 27  GLUCOSE 104* 105*  BUN 19 21  CREATININE 1.20* 1.30*  CALCIUM 9.7 9.1   Liver Function Tests: No results found for this basename: AST, ALT, ALKPHOS, BILITOT, PROT, ALBUMIN,  in the last 72 hours No results found for this basename: LIPASE, AMYLASE,  in the last 72 hours CBC:  Recent Labs  10/23/12 1835 10/24/12 0310  WBC 6.3 6.5  NEUTROABS  --  2.4  HGB 11.7* 10.8*  HCT 34.8* 31.8*  MCV 88.5 88.8  PLT 199 181   Cardiac Enzymes:  Recent Labs  10/24/12 0020 10/24/12 0620  TROPONINI <0.30 <0.30   BNP: No components found with this basename: POCBNP,  D-Dimer: No results found for this basename: DDIMER,  in the last 72 hours Hemoglobin A1C:  Recent Labs  10/24/12 0310  HGBA1C 5.8*   Fasting Lipid Panel:  Recent Labs   10/24/12 0310  CHOL 149  HDL 79  LDLCALC 62  TRIG 41  CHOLHDL 1.9   Thyroid Function Tests:  Recent Labs  10/24/12 0310  TSH 5.349*   Anemia Panel: No results found for this basename: VITAMINB12, FOLATE, FERRITIN, TIBC, IRON, RETICCTPCT,  in the last 72 hours Coag Panel:   Lab Results  Component Value Date   INR 1.12 10/24/2012   INR 1.09 04/03/2012   INR 0.94 11/22/2009    RADIOLOGY: Dg Chest Portable 1 View  10/23/2012   CLINICAL DATA:  Chest pain and shortness of breath  EXAM: PORTABLE CHEST - 1 VIEW  COMPARISON:  April 01, 2012  FINDINGS: There is mild scarring in the left lower lobe. Lungs are otherwise clear. Heart is borderline enlarged with normal pulmonary vascularity. No adenopathy. There is atherosclerotic change in the left carotid artery.  IMPRESSION: Left lower lobe scarring, stable. No edema or consolidation. Borderline cardiac enlargement.   Electronically Signed   By: Bretta Bang M.D.   On: 10/23/2012 19:05      ASSESSMENT:  1.  Atypical CP with normal cardiac enzymes x 3 and normal EKG. 2.  CAD s/p PCI of RCA/LAD - cath 03/2012 with 50-70% mid LAD and 50-70% distal LAD s/p  PCI, 70-80% ostial small left circ, 50% instent restenosis of RCA stent and 50-70% ostial PDA 3.  Anxiety 4.  HTN 5.  Esophageal stricture with hiatal hernia 6.  HTN  PLAN:   CP is atypical.  She has had several OV with me since her PCI of LAD with the same atypical sharp stabbing CP.  I have recommended proceeding with nuclear stress test to rule out ischemia.  Quintella Reichert, MD  10/24/2012  12:28 PM

## 2012-10-24 NOTE — Discharge Summary (Signed)
Patient ID: [name] MRN: [id number] DOB/AGE: 1945/07/11 67 y.o.  Admit date: 10/23/2012 Discharge date: 10/24/2012  Primary Discharge Diagnosis Noncardiac Chest pain Secondary Discharge Diagnosis  CAD s/p remote PCI of RCA/LAD  HTN  Chronic noncardiac CP  GERD with esophageal stricture  Hiatal Hernia  Depression  Hypothyroidism with mildly elevated TSH  Seizure d/o  Iron deficiency anemia  Internal hemorrhoids  Anxiety  Arthritis  Dyslipidemia     Consults: None  Hospital Course: [name] is a 67 yo woman with PMH of CAD, last known LHC 03/14, hypertension, depression, dyslipidemia, hypothyroidism here with left-sided chest pain, sharp sensation similar to prior pain in March that has been going on/off since Thursday but especially since eating at noon yesterday. She actually was riding her exercise bicycle approximately 3 miles this morning without difficulty however around 12:30 she had some CP and it was persistent. CP was left central, sharp sensation without radiation, similar to previous pain in March. She occasional has CP with deep breath but this has happened along with SOB for years she tells me and currently does not have CP with deep breath. She has no nausea or vomiting, no diarrhea, no dark stool or bright red blood per rectum. She has had intermittent CP since her PCI in March.  In the ER she had negative initial troponin POC, sinus bradycardia on ECG and NTG gtt brought her CP down from 8 to 3.   Her LHC from 03/14/12 demonstrated normal left main, 50% prox and 70% mid LAD, 50-70% D2 at takeoff, very tortuous LAD overall, 70-80% ostial LCx (small), 50% instent restenosis of LCx (small), and 50% instent restenosis of mid to distal RCA stent and 50-70% ostial PDA stenosis.   SHe was admitted and ruled out for MI by serial enzymes.  She underwent Lexiscan myoview which demonstrated no ischemia and normal LVF.  She was discharged to home in stable condition.  She was  instructed to followup with Dr. Katrinka Blazing in regards to noncardiac CP and elevated TSH.      Discharge Exam: Blood pressure 151/67, pulse 89, temperature 97.9 F (36.6 C), temperature source Oral, resp. rate 20, height 4\' 11"  (1.499 m), weight 148 lb 6.4 oz (67.314 kg), SpO2 97.00%.   General appearance: alert Resp: clear to auscultation bilaterally Cardio: regular rate and rhythm, S1, S2 normal, no murmur, click, rub or gallop GI: soft, non-tender; bowel sounds normal; no masses,  no organomegaly Extremities: extremities normal, atraumatic, no cyanosis or edema Labs:   Lab Results  Component Value Date   WBC 6.5 10/24/2012   HGB 10.8* 10/24/2012   HCT 31.8* 10/24/2012   MCV 88.8 10/24/2012   PLT 181 10/24/2012    Recent Labs Lab 10/24/12 0310  NA 141  K 4.0  CL 105  CO2 27  BUN 21  CREATININE 1.30*  CALCIUM 9.1  GLUCOSE 105*   Lab Results  Component Value Date   CKTOTAL 56 01/03/2011   CKMB 2.6 01/03/2011   TROPONINI <0.30 10/24/2012    Lab Results  Component Value Date   CHOL 149 10/24/2012   CHOL  Value: 177        ATP III CLASSIFICATION:  <200     mg/dL   Desirable  409-811  mg/dL   Borderline High  >=914    mg/dL   High        78/29/5621   Lab Results  Component Value Date   HDL 79 10/24/2012   HDL 78 11/23/2009  Lab Results  Component Value Date   LDLCALC 62 10/24/2012   LDLCALC  Value: 90        Total Cholesterol/HDL:CHD Risk Coronary Heart Disease Risk Table                     Men   Women  1/2 Average Risk   3.4   3.3  Average Risk       5.0   4.4  2 X Average Risk   9.6   7.1  3 X Average Risk  23.4   11.0        Use the calculated Patient Ratio above and the CHD Risk Table to determine the patient's CHD Risk.        ATP III CLASSIFICATION (LDL):  <100     mg/dL   Optimal  161-096  mg/dL   Near or Above                    Optimal  130-159  mg/dL   Borderline  045-409  mg/dL   High  >811     mg/dL   Very High 91/47/8295   Lab Results  Component Value  Date   TRIG 41 10/24/2012   TRIG 45 11/23/2009   Lab Results  Component Value Date   CHOLHDL 1.9 10/24/2012   CHOLHDL 2.3 11/23/2009   No results found for this basename: LDLDIRECT      Radiology: CLINICAL DATA: Chest pain and shortness of breath  EXAM:  PORTABLE CHEST - 1 VIEW  COMPARISON: April 01, 2012  FINDINGS:  There is mild scarring in the left lower lobe. Lungs are otherwise  clear. Heart is borderline enlarged with normal pulmonary  vascularity. No adenopathy. There is atherosclerotic change in the  left carotid artery.  IMPRESSION:  Left lower lobe scarring, stable. No edema or consolidation.  Borderline cardiac enlargement.  EKG:NSR with no ischemia  FOLLOW UP PLANS AND APPOINTMENTS  Future Appointments Provider Department Dept Phone   03/02/2013 8:30 AM [facility] [place] [facility] [phone]   03/14/2013 11:00 AM Quintella Reichert, MD [facility] [phone]       Medication List         ALPRAZolam 0.25 MG tablet  Commonly known as:  XANAX  Take 0.25 mg by mouth at bedtime as needed for anxiety.     aspirin EC 81 MG tablet  Take 81 mg by mouth daily.     calcitRIOL 0.5 MCG capsule  Commonly known as:  ROCALTROL  Take 0.5 mcg by mouth daily.     CALCIUM + D PO  Take 1 tablet by mouth daily.     clopidogrel 75 MG tablet  Commonly known as:  PLAVIX  Take 75 mg by mouth daily.     docusate sodium 100 MG capsule  Commonly known as:  COLACE  Take 100 mg by mouth at bedtime.     doxazosin 2 MG tablet  Commonly known as:  CARDURA  Take 2 mg by mouth daily.     isosorbide mononitrate 60 MG 24 hr tablet  Commonly known as:  IMDUR  Take 60 mg by mouth daily. Patient takes 90 mg once a day.     levETIRAcetam 500 MG tablet  Commonly known as:  KEPPRA  Take 500 mg by mouth 2 (two) times daily.     levothyroxine 100 MCG tablet  Commonly known as:  SYNTHROID, LEVOTHROID  Take 100  mcg by mouth daily before breakfast.      losartan 100 MG tablet  Commonly known as:  COZAAR  Take 100 mg by mouth daily.     MATZIM LA 240 MG 24 hr tablet  Generic drug:  diltiazem  Take 240 mg by mouth daily.     metoprolol succinate 25 MG 24 hr tablet  Commonly known as:  TOPROL-XL  Take 25 mg by mouth 2 (two) times daily.     nitroGLYCERIN 0.4 MG SL tablet  Commonly known as:  NITROSTAT  Place 0.4 mg under the tongue every 5 (five) minutes as needed for chest pain.     pantoprazole 40 MG tablet  Commonly known as:  PROTONIX  Take 40 mg by mouth 2 (two) times daily.     PARoxetine 30 MG tablet  Commonly known as:  PAXIL  Take 30 mg by mouth daily.     potassium chloride SA 20 MEQ tablet  Commonly known as:  K-DUR,KLOR-CON  Take 20 mEq by mouth 2 (two) times daily.     rosuvastatin 10 MG tablet  Commonly known as:  CRESTOR  Take 10 mg by mouth every morning.     vitamin E 400 UNIT capsule  Take 800 Units by mouth daily.         BRING ALL MEDICATIONS WITH YOU TO FOLLOW UP APPOINTMENTS  Time spent with patient to include physician time:35 minutes Signed: Quintella Reichert 10/24/2012, 4:37 PM

## 2012-10-24 NOTE — ED Provider Notes (Addendum)
I have personally seen and examined the patient.  I have discussed the plan of care with the resident.  I have reviewed the documentation on PMH/FH/Soc. History.  I have reviewed the documentation of the resident and agree.  Doug Sou, MD 10/24/12 0016 I agree with resident EKG interpretation  Doug Sou, MD 11/05/12 0800

## 2012-10-24 NOTE — Progress Notes (Signed)
Utilization review completed.  

## 2012-10-24 NOTE — Telephone Encounter (Signed)
Pt in hospital and being seen by Dr. Mayford Knife.

## 2012-10-24 NOTE — Progress Notes (Signed)
Pt is ready for DC home accompanied by family. Patient and husband report that they understand all DC instructions and follow up appointments.   Torie Warden/ranger

## 2012-10-30 ENCOUNTER — Ambulatory Visit (INDEPENDENT_AMBULATORY_CARE_PROVIDER_SITE_OTHER): Payer: Medicare Other | Admitting: Internal Medicine

## 2012-10-30 ENCOUNTER — Other Ambulatory Visit: Payer: Self-pay

## 2012-10-30 ENCOUNTER — Encounter: Payer: Self-pay | Admitting: Internal Medicine

## 2012-10-30 ENCOUNTER — Ambulatory Visit (INDEPENDENT_AMBULATORY_CARE_PROVIDER_SITE_OTHER)
Admission: RE | Admit: 2012-10-30 | Discharge: 2012-10-30 | Disposition: A | Discharge status: Home / Self Care | Payer: Medicare Other | Source: Ambulatory Visit | Attending: Internal Medicine | Admitting: Internal Medicine

## 2012-10-30 VITALS — BP 116/62 | HR 60 | Ht 59.0 in | Wt 147.5 lb

## 2012-10-30 DIAGNOSIS — K222 Esophageal obstruction: Secondary | ICD-10-CM

## 2012-10-30 DIAGNOSIS — K219 Gastro-esophageal reflux disease without esophagitis: Secondary | ICD-10-CM | POA: Diagnosis not present

## 2012-10-30 DIAGNOSIS — R1011 Right upper quadrant pain: Secondary | ICD-10-CM

## 2012-10-30 DIAGNOSIS — I2510 Atherosclerotic heart disease of native coronary artery without angina pectoris: Secondary | ICD-10-CM | POA: Diagnosis not present

## 2012-10-30 DIAGNOSIS — R079 Chest pain, unspecified: Secondary | ICD-10-CM | POA: Diagnosis not present

## 2012-10-30 DIAGNOSIS — R222 Localized swelling, mass and lump, trunk: Secondary | ICD-10-CM

## 2012-10-30 MED ORDER — IOHEXOL 350 MG/ML SOLN
80.0000 mL | Freq: Once | INTRAVENOUS | Status: AC | PRN
  Administered 2012-10-30: 80 mL via INTRAVENOUS

## 2012-10-30 NOTE — Patient Instructions (Signed)
You have been scheduled for a CT angio chest at [facility] ([address]---this is in the same building as Architectural technologist).   You are scheduled on 10/30/12 at 2:00pm. You should arrive 15 minutes prior to your appointment time for registration. Please follow the written instructions below on the day of your exam:  1) Do not eat or drink anything after 12:00pm (2 hours prior to your test)   You may take any medications as prescribed with a small amount of water except for the following: Metformin, Glucophage, Glucovance, Avandamet, Riomet, Fortamet, Actoplus Met, Janumet, Glumetza or Metaglip. The above medications must be held the day of the exam AND 48 hours after the exam.  The purpose of you drinking the oral contrast is to aid in the visualization of your intestinal tract. The contrast solution may cause some diarrhea. Before your exam is started, you will be given a small amount of fluid to drink. Depending on your individual set of symptoms, you may also receive an intravenous injection of x-ray contrast/dye. Plan on being at [facility] for 30 minutes or long, depending on the type of exam you are having performed.  This test typically takes 30-45 minutes to complete.  If you have any questions regarding your exam or if you need to reschedule, you may call the CT department at [phone] between the hours of 8:00 am and 5:00 pm, Monday-Friday.  ________________________________________________________________________   [name] have been scheduled for an abdominal ultrasound at [facility] (1st floor of hospital) on 11/02/12 at 9:00am. Please arrive 15 minutes prior to your appointment for registration. Make certain not to have anything to eat or drink 6 hours prior to your appointment. Should you need to reschedule your appointment, please contact radiology at [phone]. This test typically takes about 30 minutes to perform.  cc: Merri Brunette, MD

## 2012-10-30 NOTE — Progress Notes (Signed)
HISTORY OF PRESENT ILLNESS:  [name] is a 67 y.o. female who presents today at the request of her cardiologist regarding chest pain. She is accompanied by her husband. She reports a one-year history of intermittent problems with chest pain. Also, right upper quadrant discomfort which radiates to the chest and back. The discomfort can last for hours. No obvious relieving or exacerbating factors. No associated nausea, vomiting, or change in bowel habits. She does have a history of GERD for which she is on twice daily PPI. She does have some regurgitation but no pyrosis. She has a history of dysphagia and underwent esophageal dilation last year. Currently without dysphagia. Her discomfort is not affected by meals. She mentions that the discomfort is often associated with shortness of breath. Her problem has been worse recently and required hospitalization one week ago. Hospitalization, x-rays, and laboratory data reviewed. Had cardiac enzymes as well as stress test. Was told her pain was noncardiac. GI review of systems is otherwise negative. She does have significant arthritis for which she has recently undergone steroid injections into her neck and back.  REVIEW OF SYSTEMS:  All non-GI ROS negative except for arthritis, back pain, hearing problems, heart murmur, shortness of breath, sleeping problems  Past Medical History  Diagnosis Date  . Irritable bowel syndrome   . Esophageal reflux   . Dysphagia, unspecified(787.20)   . Hypertension   . Depression   . Hypothyroidism   . Seizures   . Iron deficiency anemia   . Internal hemorrhoids   . Coronary artery disease     coronary angiogram on 03/14/12 showed Normal left main coronary artery, 50% proximal and 70% mid stenosis of the LAD and distally at takeoff of second diagonal there is a 50-70% stenosis in a very tortuous LAD, 70-80% ostial stenosis of the left circumflex in a small vessel, and 50% instent restenosis of the mid to distal portion  of the RCA stent and 50-70% stenosis of the ostial PDA.   Marland Kitchen Anxiety   . Arthritis   . Blood transfusion 1960  . Heart murmur   . Hyperlipidemia   . Myocardial infarction 11/2009    1 stent placed  . Esophageal stricture   . Hiatal hernia   . Shortness of breath     Past Surgical History  Procedure Laterality Date  . Breast surgery  2010     benign cysts  . Coronary angioplasty with stent placement  2011 and 2012    drug-eluting stent w/ ion stents to the distal right coronary artery    Social History Arvilla REMA LIEVANOS  reports that she has quit smoking. She has never used smokeless tobacco. She reports that she does not drink alcohol or use illicit drugs.  family history includes Breast cancer in her sister and another family member; Heart attack in her mother; Heart disease in her father. There is no history of Colon cancer.  No Known Allergies     PHYSICAL EXAMINATION: Vital signs: BP 116/62  Pulse 60  Ht 4\' 11"  (1.499 m)  Wt 147 lb 8 oz (66.906 kg)  BMI 29.78 kg/m2 General: Well-developed, well-nourished, no acute distress HEENT: Sclerae are anicteric, conjunctiva pink. Oral mucosa intact Lungs: Clear Heart: Regular Abdomen: soft, nontender, nondistended, no obvious ascites, no peritoneal signs, normal bowel sounds. No organomegaly. Tenderness along the right rib cage which she states somewhat reproduces her pain Extremities: No edema Psychiatric: alert and oriented x3. Cooperative   ASSESSMENT:  #1. Chronic chest pain. Etiology  unclear. Atypical for GI. Significant history of cardiac disease with a negative recent cardiac workup. Also complains of shortness of breath. #2. GERD. Asymptomatic on PPI #3. History of dysphagia. Asymptomatic post dilation #4. Right upper quadrant discomfort. Possibly musculoskeletal based on today's physical exam. Rule out gallbladder disease with history of radiation to chest and back #5. Normal colonoscopy December 2011. Followup 10  years from index exam  PLAN:  #1. CT angiogram chest to rule out chronic pulmonary emboli. #2. Abdominal ultrasound with attention to the Gallbladder #3. Continue PPI and reflux precautions #4. If above workup negative returned to PCP regarding pain management strategies. She might, in the interim, try Tylenol for relief of probable musculoskeletal type discomfort

## 2012-10-31 ENCOUNTER — Other Ambulatory Visit: Payer: Self-pay

## 2012-10-31 ENCOUNTER — Telehealth: Payer: Self-pay | Admitting: Internal Medicine

## 2012-10-31 DIAGNOSIS — R222 Localized swelling, mass and lump, trunk: Secondary | ICD-10-CM

## 2012-10-31 NOTE — Telephone Encounter (Signed)
Pt scheduled to see Dr. Maren Beach 11/08/12@1pm . Pt aware of appt date and time.

## 2012-11-02 ENCOUNTER — Ambulatory Visit (HOSPITAL_COMMUNITY)
Admission: RE | Admit: 2012-11-02 | Discharge: 2012-11-02 | Disposition: A | Discharge status: Home / Self Care | Payer: Medicare Other | Source: Ambulatory Visit | Attending: Internal Medicine | Admitting: Internal Medicine

## 2012-11-02 ENCOUNTER — Other Ambulatory Visit (HOSPITAL_COMMUNITY): Payer: Medicare Other

## 2012-11-02 DIAGNOSIS — R1011 Right upper quadrant pain: Secondary | ICD-10-CM | POA: Diagnosis not present

## 2012-11-02 DIAGNOSIS — K838 Other specified diseases of biliary tract: Secondary | ICD-10-CM | POA: Insufficient documentation

## 2012-11-02 DIAGNOSIS — K824 Cholesterolosis of gallbladder: Secondary | ICD-10-CM | POA: Diagnosis not present

## 2012-11-02 DIAGNOSIS — R079 Chest pain, unspecified: Secondary | ICD-10-CM

## 2012-11-06 ENCOUNTER — Telehealth: Payer: Self-pay

## 2012-11-06 NOTE — Telephone Encounter (Signed)
PATIENT CALLED WANTING SAMPLES OF CRESTO WE ARE CURRENTLY OUT OF CRESTOR SAMPLES AND I TOLD THE PATIENT THAT I WAOULD CALL HER WHEN WE GET SOME IN

## 2012-11-07 ENCOUNTER — Telehealth: Payer: Self-pay

## 2012-11-07 NOTE — Telephone Encounter (Signed)
Call patient to let him know that samples of crestor was at the front desk 

## 2012-11-08 ENCOUNTER — Encounter: Payer: Self-pay | Admitting: Cardiothoracic Surgery

## 2012-11-08 ENCOUNTER — Institutional Professional Consult (permissible substitution) (INDEPENDENT_AMBULATORY_CARE_PROVIDER_SITE_OTHER): Payer: Medicare Other | Admitting: Cardiothoracic Surgery

## 2012-11-08 ENCOUNTER — Other Ambulatory Visit: Payer: Self-pay | Admitting: *Deleted

## 2012-11-08 VITALS — BP 138/78 | HR 66 | Resp 16 | Ht 59.0 in | Wt 147.0 lb

## 2012-11-08 DIAGNOSIS — J9859 Other diseases of mediastinum, not elsewhere classified: Secondary | ICD-10-CM

## 2012-11-08 DIAGNOSIS — R222 Localized swelling, mass and lump, trunk: Secondary | ICD-10-CM | POA: Diagnosis not present

## 2012-11-08 NOTE — Progress Notes (Signed)
PCP is Allean Found, MD Referring Provider is Hilarie Fredrickson, MD  Chief Complaint  Patient presents with  . Lung Mass    eval and treat.Marland KitchenMarland KitchenCTA CHEST 10/30/12    HPI: 67 year old Caucasian female nonsmoker with history of MI in PCI-stent to the right coronary 2007. Recently admitted to the hospital with atypical chest pain. Cardiac enzymes negative and cardiac perfusion stress test negative. Patient underwent GI evaluation-ultrasound of gallbladder was negative. Upper endoscopy and esophageal dilatation were performed subsequently. Patient returned to the ED with similar right anterior chest pain. A subsequent CTA rule out pulmonary emboli demonstrated an anterior, inferior mediastinal mass 6 cm x 3 cm running longitudinally in front of the ascending aorta. No pericardial effusion. Last 2-D echo one year ago shows good biventricular function. Patient is a nonsmoker. Patient denies weight loss but admits to night sweats recently. She denies fatigue or decreased energy and is going with her husband to the fitness center 3 days a week. The patient is retired Armed forces technical officer around town. No exposure to asbestos or other dusts or fibers. Last CT of brain was a year ago which was negative. Masses not seen on chest x-ray and was only seen after the CTA was performed to rule out PE.   Past Medical History  Diagnosis Date  . Irritable bowel syndrome   . Esophageal reflux   . Dysphagia, unspecified(787.20)   . Hypertension   . Depression   . Hypothyroidism   . Seizures   . Iron deficiency anemia   . Internal hemorrhoids   . Coronary artery disease     coronary angiogram on 03/14/12 showed Normal left main coronary artery, 50% proximal and 70% mid stenosis of the LAD and distally at takeoff of second diagonal there is a 50-70% stenosis in a very tortuous LAD, 70-80% ostial stenosis of the left circumflex in a small vessel, and 50% instent restenosis of the mid to distal portion  of the RCA stent and 50-70% stenosis of the ostial PDA.   Marland Kitchen Anxiety   . Arthritis   . Blood transfusion 1960  . Heart murmur   . Hyperlipidemia   . Myocardial infarction 11/2009    1 stent placed  . Esophageal stricture   . Hiatal hernia   . Shortness of breath     Past Surgical History  Procedure Laterality Date  . Breast surgery  2010     benign cysts  . Coronary angioplasty with stent placement  2011 and 2012    drug-eluting stent w/ ion stents to the distal right coronary artery    Family History  Problem Relation Age of Onset  . Breast cancer Sister   . Breast cancer      niece  . Heart disease Father   . Colon cancer Neg Hx   . Heart attack Mother     Social History History  Substance Use Topics  . Smoking status: Former Smoker -- 0.30 packs/day for 3 years    Types: Cigarettes    Start date: 11/08/1964    Quit date: 11/09/1967  . Smokeless tobacco: Never Used  . Alcohol Use: No    Current Outpatient Prescriptions  Medication Sig Dispense Refill  . ALPRAZolam (XANAX) 0.25 MG tablet Take 0.25 mg by mouth at bedtime as needed for anxiety.       Marland Kitchen aspirin EC 81 MG tablet Take 81 mg by mouth daily.      . calcitRIOL (ROCALTROL) 0.5 MCG capsule Take 0.5  mcg by mouth daily.       . Calcium Carbonate-Vitamin D (CALCIUM + D PO) Take 1 tablet by mouth daily.      . clopidogrel (PLAVIX) 75 MG tablet Take 75 mg by mouth daily.       Marland Kitchen diltiazem (MATZIM LA) 240 MG 24 hr tablet Take 240 mg by mouth daily.      Marland Kitchen docusate sodium (COLACE) 100 MG capsule Take 100 mg by mouth at bedtime.      Marland Kitchen doxazosin (CARDURA) 2 MG tablet Take 2 mg by mouth daily.       . isosorbide mononitrate (IMDUR) 60 MG 24 hr tablet Take 60 mg by mouth daily. Patient takes 90 mg once a day.      . levothyroxine (SYNTHROID, LEVOTHROID) 100 MCG tablet Take 100 mcg by mouth daily before breakfast.       . losartan (COZAAR) 100 MG tablet Take 100 mg by mouth daily.      . metoprolol succinate  (TOPROL-XL) 25 MG 24 hr tablet Take 25 mg by mouth daily.       . nitroGLYCERIN (NITROSTAT) 0.4 MG SL tablet Place 0.4 mg under the tongue every 5 (five) minutes as needed for chest pain.      . pantoprazole (PROTONIX) 40 MG tablet Take 40 mg by mouth 2 (two) times daily.      Marland Kitchen PARoxetine (PAXIL) 30 MG tablet Take 30 mg by mouth daily.       . potassium chloride SA (K-DUR,KLOR-CON) 20 MEQ tablet Take 20 mEq by mouth 2 (two) times daily.      . rosuvastatin (CRESTOR) 10 MG tablet Take 10 mg by mouth every morning.       . vitamin E 400 UNIT capsule Take 800 Units by mouth daily.       Marland Kitchen levETIRAcetam (KEPPRA) 500 MG tablet Take 500 mg by mouth 2 (two) times daily.        No current facility-administered medications for this visit.    No Known Allergies  Review of Systems No fevers, positive night sweats No recent headache or change in vision Positive dysphagia requiring esophageal dilatation intermittently by Dr. Yancey Flemings No major surgical operations in the past except for a tubal pregnancy Patient lives with her husband, has no children, husband been treated for prostate cancer She denies diabetes She denies DVT claudication or TIA   BP 138/78  Pulse 66  Resp 16  Ht 4\' 11"  (1.499 m)  Wt 147 lb (66.679 kg)  BMI 29.67 kg/m2  SpO2 97% Physical Exam Gen. appearance-middle-aged Caucasian female no acute distress HEENT normocephalic dentition good pupils equal Neck no JVD mass or bruit Lymphatics no palpable adenopathy in neck or supralevator fossa Thorax clear breath sounds, no deformity or tenderness, poor respiratory mechanics Cardiac regular rhythm without murmur or gallop Abdomen soft mildly obese nontender without pulsatile mass Extremities no clubbing cyanosis edema Neuro no focal motor deficit normal gait  Diagnostic Tests: CT scan of the chest reviewed demonstrating the irregularly shaped anterior mediastinal mass. There are no discrete pulmonary nodules. There is no  pericardial or pleural effusion. There no pathologically enlarged mediastinal nodes. Is felt this probably represents either thymoma lymphoma or potentially teratoma.  Impression: Plan Fairly large anterior mediastinal mass with symptoms of atypical chest pain. We'll proceed with PET scan and PFTs and followup in office

## 2012-11-17 ENCOUNTER — Encounter (HOSPITAL_COMMUNITY): Payer: Self-pay

## 2012-11-17 ENCOUNTER — Ambulatory Visit (HOSPITAL_COMMUNITY)
Admission: RE | Admit: 2012-11-17 | Discharge: 2012-11-17 | Disposition: A | Discharge status: Home / Self Care | Payer: Medicare Other | Source: Ambulatory Visit | Attending: Cardiothoracic Surgery | Admitting: Cardiothoracic Surgery

## 2012-11-17 DIAGNOSIS — N289 Disorder of kidney and ureter, unspecified: Secondary | ICD-10-CM | POA: Insufficient documentation

## 2012-11-17 DIAGNOSIS — I2510 Atherosclerotic heart disease of native coronary artery without angina pectoris: Secondary | ICD-10-CM | POA: Diagnosis not present

## 2012-11-17 DIAGNOSIS — D259 Leiomyoma of uterus, unspecified: Secondary | ICD-10-CM | POA: Insufficient documentation

## 2012-11-17 DIAGNOSIS — R222 Localized swelling, mass and lump, trunk: Secondary | ICD-10-CM | POA: Diagnosis not present

## 2012-11-17 DIAGNOSIS — I517 Cardiomegaly: Secondary | ICD-10-CM | POA: Insufficient documentation

## 2012-11-17 DIAGNOSIS — J9859 Other diseases of mediastinum, not elsewhere classified: Secondary | ICD-10-CM

## 2012-11-17 LAB — PULMONARY FUNCTION TEST
DL/VA % pred: 87 %
DL/VA: 3.58 ml/min/mmHg/L
DLCO unc % pred: 53 %
DLCO unc: 9.37 ml/min/mmHg
FEF 25-75 Post: 1.72 L/sec
FEF 25-75 Pre: 1.37 L/sec
FEF2575-%Change-Post: 25 %
FEF2575-%Pred-Post: 96 %
FEF2575-%Pred-Pre: 76 %
FEV1-%Change-Post: 9 %
FEV1-%Pred-Post: 80 %
FEV1-%Pred-Pre: 73 %
FEV1-Post: 1.55 L
FEV1-Pre: 1.42 L
FEV1FVC-%Change-Post: 6 %
FEV1FVC-%Pred-Pre: 103 %
FEV6-%Change-Post: 4 %
FEV6-%Pred-Post: 75 %
FEV6-%Pred-Pre: 71 %
FEV6-Post: 1.82 L
FEV6-Pre: 1.74 L
FEV6FVC-%Change-Post: 1 %
FEV6FVC-%Pred-Post: 104 %
FEV6FVC-%Pred-Pre: 102 %
FVC-%Change-Post: 2 %
FVC-%Pred-Post: 72 %
FVC-%Pred-Pre: 70 %
FVC-Post: 1.82 L
FVC-Pre: 1.77 L
Post FEV1/FVC ratio: 85 %
Post FEV6/FVC ratio: 100 %
Pre FEV1/FVC ratio: 80 %
Pre FEV6/FVC Ratio: 98 %
RV % pred: 63 %
RV: 1.18 L
TLC % pred: 69 %
TLC: 3.01 L

## 2012-11-17 LAB — GLUCOSE, CAPILLARY: Glucose-Capillary: 100 mg/dL — ABNORMAL HIGH (ref 70–99)

## 2012-11-17 MED ORDER — ALBUTEROL SULFATE (5 MG/ML) 0.5% IN NEBU
2.5000 mg | INHALATION_SOLUTION | Freq: Once | RESPIRATORY_TRACT | Status: AC
  Administered 2012-11-17: 2.5 mg via RESPIRATORY_TRACT

## 2012-11-17 MED ORDER — FLUDEOXYGLUCOSE F - 18 (FDG) INJECTION
19.4000 | Freq: Once | INTRAVENOUS | Status: AC | PRN
  Administered 2012-11-17: 19.4 via INTRAVENOUS

## 2012-11-20 ENCOUNTER — Ambulatory Visit: Payer: Medicare Other | Admitting: Cardiothoracic Surgery

## 2012-11-20 ENCOUNTER — Encounter: Payer: Self-pay | Admitting: Cardiothoracic Surgery

## 2012-11-20 ENCOUNTER — Ambulatory Visit (INDEPENDENT_AMBULATORY_CARE_PROVIDER_SITE_OTHER): Payer: Medicare Other | Admitting: Cardiothoracic Surgery

## 2012-11-20 ENCOUNTER — Encounter: Payer: Medicare Other | Admitting: Cardiology

## 2012-11-20 VITALS — BP 161/100 | HR 68 | Resp 19 | Ht 59.0 in | Wt 150.0 lb

## 2012-11-20 DIAGNOSIS — R222 Localized swelling, mass and lump, trunk: Secondary | ICD-10-CM

## 2012-11-20 DIAGNOSIS — J9859 Other diseases of mediastinum, not elsewhere classified: Secondary | ICD-10-CM

## 2012-11-20 NOTE — Progress Notes (Signed)
PCP is Allean Found, MD Referring Provider is Hilarie Fredrickson, MD  Chief Complaint  Patient presents with  . Lung Mass    2 week f/u, S/P PET Scan and PFT'S    HPI: 6 x 3 cm anterior mediastinal mass, symptomatic with right-sided chest pain requiring admission and rule out for MI as well as a second evaluation in the emergency department and rule out for pulmonary embolus. PET scan done since her initial consultation shows this to be probable benign-cystic without any metabolic activity. PFTs show her pulmonary function be fairly well preserved with FVC 72% FEV1 73% of predicted DLCO 53%.  Patient has had coronary disease treated with PCI, last stent placed March 2014 for which he is taking Plavix. Her most recent echocardiogram shows good LV function.  I reviewed the results of the PET scan showing this to be a benign cystic structure, possibly thymic cyst or bronchogenic cyst. Since it is symptomatic it should probably be resected however he will be safe to wait until after the holidays. The patient understands this will require sternotomy and a 5-7 day hospital stay. We will probably wait until it is safe to stop her Plavix for a-week prior to surgery and this will be discussed with her cardiologist Dr. Mayford Knife. Past Medical History  Diagnosis Date  . Irritable bowel syndrome   . Esophageal reflux   . Dysphagia, unspecified(787.20)   . Hypertension   . Depression   . Hypothyroidism   . Seizures   . Iron deficiency anemia   . Internal hemorrhoids   . Coronary artery disease     coronary angiogram on 03/14/12 showed Normal left main coronary artery, 50% proximal and 70% mid stenosis of the LAD and distally at takeoff of second diagonal there is a 50-70% stenosis in a very tortuous LAD, 70-80% ostial stenosis of the left circumflex in a small vessel, and 50% instent restenosis of the mid to distal portion of the RCA stent and 50-70% stenosis of the ostial PDA.   Marland Kitchen Anxiety   .  Arthritis   . Blood transfusion 1960  . Heart murmur   . Hyperlipidemia   . Myocardial infarction 11/2009    1 stent placed  . Esophageal stricture   . Hiatal hernia   . Shortness of breath     Past Surgical History  Procedure Laterality Date  . Breast surgery  2010     benign cysts  . Coronary angioplasty with stent placement  2011 and 2012    drug-eluting stent w/ ion stents to the distal right coronary artery    Family History  Problem Relation Age of Onset  . Breast cancer Sister   . Breast cancer      niece  . Heart disease Father   . Colon cancer Neg Hx   . Heart attack Mother     Social History History  Substance Use Topics  . Smoking status: Former Smoker -- 0.30 packs/day for 3 years    Types: Cigarettes    Start date: 11/08/1964    Quit date: 11/09/1967  . Smokeless tobacco: Never Used  . Alcohol Use: No    Current Outpatient Prescriptions  Medication Sig Dispense Refill  . ALPRAZolam (XANAX) 0.25 MG tablet Take 0.25 mg by mouth at bedtime as needed for anxiety.       Marland Kitchen aspirin EC 81 MG tablet Take 81 mg by mouth daily.      . calcitRIOL (ROCALTROL) 0.5 MCG capsule Take 0.5 mcg  by mouth daily.       . Calcium Carbonate-Vitamin D (CALCIUM + D PO) Take 1 tablet by mouth daily.      . clopidogrel (PLAVIX) 75 MG tablet Take 75 mg by mouth daily.       Marland Kitchen diltiazem (MATZIM LA) 240 MG 24 hr tablet Take 240 mg by mouth daily.      Marland Kitchen docusate sodium (COLACE) 100 MG capsule Take 100 mg by mouth at bedtime.      Marland Kitchen doxazosin (CARDURA) 2 MG tablet Take 2 mg by mouth daily.       . isosorbide mononitrate (IMDUR) 60 MG 24 hr tablet Take 60 mg by mouth daily. Patient takes 90 mg once a day.      . levETIRAcetam (KEPPRA) 500 MG tablet Take 500 mg by mouth 2 (two) times daily.       Marland Kitchen levothyroxine (SYNTHROID, LEVOTHROID) 100 MCG tablet Take 100 mcg by mouth daily before breakfast.       . losartan (COZAAR) 100 MG tablet Take 100 mg by mouth daily.      . metoprolol  succinate (TOPROL-XL) 25 MG 24 hr tablet Take 25 mg by mouth daily.       . nitroGLYCERIN (NITROSTAT) 0.4 MG SL tablet Place 0.4 mg under the tongue every 5 (five) minutes as needed for chest pain.      . pantoprazole (PROTONIX) 40 MG tablet Take 40 mg by mouth 2 (two) times daily.      Marland Kitchen PARoxetine (PAXIL) 30 MG tablet Take 30 mg by mouth daily.       . potassium chloride SA (K-DUR,KLOR-CON) 20 MEQ tablet Take 20 mEq by mouth 2 (two) times daily.      . rosuvastatin (CRESTOR) 10 MG tablet Take 10 mg by mouth every morning.       . vitamin E 400 UNIT capsule Take 800 Units by mouth daily.        No current facility-administered medications for this visit.    No Known Allergies  Review of Systems mild persistent right anterior chest pain  BP 161/100  Pulse 68  Resp 19  Ht 4\' 11"  (1.499 m)  Wt 150 lb (68.04 kg)  BMI 30.28 kg/m2  SpO2 93% Physical Exam Alert comfortable Lungs clear No adenopathy in the neck Heart rhythm regular without murmur  Diagnostic Tests: PET scan results discussed with patient and husband and they understand it is a probable benign cystic structure either thymic cyst or bronchogenic cyst  Impression: Patient return for review his situation in January 2015 and to discuss timing of surgery and to discuss when Plavix can be safely stopped prior to surgery  Plan: Return January 2015

## 2012-11-22 ENCOUNTER — Encounter: Payer: Self-pay | Admitting: Cardiology

## 2012-11-24 ENCOUNTER — Encounter: Payer: Self-pay | Admitting: Cardiology

## 2012-11-24 ENCOUNTER — Ambulatory Visit (INDEPENDENT_AMBULATORY_CARE_PROVIDER_SITE_OTHER): Payer: Medicare Other | Admitting: Cardiology

## 2012-11-24 VITALS — BP 154/83 | HR 62 | Ht 59.0 in | Wt 148.4 lb

## 2012-11-24 DIAGNOSIS — I2510 Atherosclerotic heart disease of native coronary artery without angina pectoris: Secondary | ICD-10-CM | POA: Diagnosis not present

## 2012-11-24 DIAGNOSIS — E785 Hyperlipidemia, unspecified: Secondary | ICD-10-CM

## 2012-11-24 DIAGNOSIS — I10 Essential (primary) hypertension: Secondary | ICD-10-CM

## 2012-11-24 NOTE — Patient Instructions (Signed)
Your physician recommends that you continue on your current medications as directed. Please refer to the Current Medication list given to you today.  We do not have any samples of Crestor. I would try back in a week or so to see if we have any then.   We cancelled your appt in March of 2015 due to that being to early to see Dr Mayford Knife  Your physician wants you to follow-up in: 6 Months with Dr. Sherlyn Lick will receive a reminder letter in the mail two months in advance. If you don't receive a letter, please call our office to schedule the follow-up appointment.

## 2012-11-24 NOTE — Progress Notes (Signed)
[address] [place], [place]  [address] Phone: [phone] Fax:  [phone]  Date:  11/24/2012   ID:  [name], DOB Jun 04, 1945, MRN [id number]  PCP:  Allean Found, MD  Cardiologist:  Armanda Magic, MD      History of Present Illness: [name] is a 67 y.o. female with a history of CAD/HTN and lipids who presents today for followup.  She was recently in the hospital for atypical CP and had a normal nuclear stress test.  She underwent Gi eval which was negative for gallstones and she underwent esophageal dilatation but returned to the ER with recurrent CP and was found to have a 6cm mediastinal mass in front of the ascending aorta.  She was seen by Dr. Donata Clay who felt that this probably was a teratoma or thymoma lymphoma.  Plan was for PET scan and PFTs.  PET scan did not show mass to be hypermetabolic.  It was felt that abnormality may be due to a pericardial cyst.  Dr. Donata Clay has seen her back and would like to consider resection after the holidays.  She continues to have chronic CP that comes and goes and never takes NTG.  She has chronic SOB which is stable.  She denies any LE edema, dizziness or syncope.  Wt Readings from Last 3 Encounters:  11/24/12 148 lb 6.4 oz (67.314 kg)  11/20/12 150 lb (68.04 kg)  11/08/12 147 lb (66.679 kg)     Past Medical History  Diagnosis Date  . Irritable bowel syndrome   . Esophageal reflux   . Dysphagia, unspecified(787.20)   . Hypertension   . Depression   . Hypothyroidism   . Seizures   . Iron deficiency anemia   . Internal hemorrhoids   . Coronary artery disease     coronary angiogram on 03/14/12 showed Normal left main coronary artery, 50% proximal and 70% mid stenosis of the LAD and distally at takeoff of second diagonal there is a 50-70% stenosis in a very tortuous LAD, 70-80% ostial stenosis of the left circumflex in a small vessel, and 50% instent restenosis of the mid to distal portion of the RCA stent  and 50-70% stenosis of the ostial PDA.   Marland Kitchen Anxiety   . Arthritis   . Blood transfusion 1960  . Heart murmur   . Hyperlipidemia   . Myocardial infarction 11/2009    1 stent placed  . Esophageal stricture   . Hiatal hernia   . Shortness of breath     Current Outpatient Prescriptions  Medication Sig Dispense Refill  . ALPRAZolam (XANAX) 0.25 MG tablet Take 0.25 mg by mouth at bedtime as needed for anxiety.       Marland Kitchen aspirin EC 81 MG tablet Take 81 mg by mouth daily.      . calcitRIOL (ROCALTROL) 0.5 MCG capsule Take 0.5 mcg by mouth daily.       . Calcium Carbonate-Vitamin D (CALCIUM + D PO) Take 1 tablet by mouth daily.      . clopidogrel (PLAVIX) 75 MG tablet Take 75 mg by mouth daily.       Marland Kitchen diltiazem (MATZIM LA) 240 MG 24 hr tablet Take 240 mg by mouth daily.      Marland Kitchen docusate sodium (COLACE) 100 MG capsule Take 100 mg by mouth at bedtime.      Marland Kitchen doxazosin (CARDURA) 2 MG tablet Take 2 mg by mouth daily.       Marland Kitchen  isosorbide mononitrate (IMDUR) 60 MG 24 hr tablet Take 60 mg by mouth daily. Patient takes 90 mg once a day.      . levETIRAcetam (KEPPRA) 500 MG tablet Take 500 mg by mouth 2 (two) times daily.       Marland Kitchen levothyroxine (SYNTHROID, LEVOTHROID) 100 MCG tablet Take 100 mcg by mouth daily before breakfast.       . losartan (COZAAR) 100 MG tablet Take 100 mg by mouth daily.      . metoprolol succinate (TOPROL-XL) 25 MG 24 hr tablet Take 25 mg by mouth daily.       . nitroGLYCERIN (NITROSTAT) 0.4 MG SL tablet Place 0.4 mg under the tongue every 5 (five) minutes as needed for chest pain.      . pantoprazole (PROTONIX) 40 MG tablet Take 40 mg by mouth 2 (two) times daily.      Marland Kitchen PARoxetine (PAXIL) 30 MG tablet Take 30 mg by mouth daily.       . potassium chloride SA (K-DUR,KLOR-CON) 20 MEQ tablet Take 20 mEq by mouth 2 (two) times daily.      . rosuvastatin (CRESTOR) 10 MG tablet Take 10 mg by mouth every morning.       . vitamin E 400 UNIT capsule Take 800 Units by mouth daily.         No current facility-administered medications for this visit.    Allergies:   No Known Allergies  Social History:  The patient  reports that she quit smoking about 45 years ago. Her smoking use included Cigarettes. She started smoking about 48 years ago. She has a .9 pack-year smoking history. She has never used smokeless tobacco. She reports that she does not drink alcohol or use illicit drugs.   Family History:  The patient's family history includes Breast cancer in her sister and another family member; Heart attack in her mother; Heart disease in her father. There is no history of Colon cancer.   ROS:  Please see the history of present illness.      All other systems reviewed and negative.   PHYSICAL EXAM: VS:  BP 154/83  Pulse 62  Ht 4\' 11"  (1.499 m)  Wt 148 lb 6.4 oz (67.314 kg)  BMI 29.96 kg/m2 Well nourished, well developed, in no acute distress HEENT: normal Neck: no JVD Cardiac:  normal S1, S2; RRR; no murmur Lungs:  clear to auscultation bilaterally, no wheezing, rhonchi or rales Abd: soft, nontender, no hepatomegaly Ext: no edema Skin: warm and dry Neuro:  CNs 2-12 intact, no focal abnormalities noted       ASSESSMENT AND PLAN:  1.   ASCAD with recent nuclear stress test with no ischemia  - continue ASA/Plavix/Imdur/metoprolol 2.   HTN - BP mildly elevated  - continue metoprolol/Losartan/doxazosin/diltiazem  3.   Dyslipidemia - LDL at goal at 62   - continue crestor 4.   Chronic noncardiac CP 5.   GERD with esophageal stricture  Followup with me in 6 months  Signed, Armanda Magic, MD 11/24/2012 1:26 PM

## 2012-12-04 ENCOUNTER — Telehealth: Payer: Self-pay | Admitting: *Deleted

## 2012-12-04 NOTE — Telephone Encounter (Signed)
Patient requests crestor samples. Patient aware they will be placed at front desk for pick up.

## 2012-12-11 DIAGNOSIS — N182 Chronic kidney disease, stage 2 (mild): Secondary | ICD-10-CM | POA: Diagnosis not present

## 2012-12-11 DIAGNOSIS — I10 Essential (primary) hypertension: Secondary | ICD-10-CM | POA: Diagnosis not present

## 2012-12-11 DIAGNOSIS — E209 Hypoparathyroidism, unspecified: Secondary | ICD-10-CM | POA: Diagnosis not present

## 2012-12-11 DIAGNOSIS — E039 Hypothyroidism, unspecified: Secondary | ICD-10-CM | POA: Diagnosis not present

## 2012-12-13 DIAGNOSIS — F329 Major depressive disorder, single episode, unspecified: Secondary | ICD-10-CM | POA: Diagnosis not present

## 2012-12-13 DIAGNOSIS — Q248 Other specified congenital malformations of heart: Secondary | ICD-10-CM | POA: Diagnosis not present

## 2012-12-13 DIAGNOSIS — I10 Essential (primary) hypertension: Secondary | ICD-10-CM | POA: Diagnosis not present

## 2012-12-13 DIAGNOSIS — R222 Localized swelling, mass and lump, trunk: Secondary | ICD-10-CM | POA: Diagnosis not present

## 2012-12-13 DIAGNOSIS — I2510 Atherosclerotic heart disease of native coronary artery without angina pectoris: Secondary | ICD-10-CM | POA: Diagnosis not present

## 2012-12-24 ENCOUNTER — Other Ambulatory Visit: Payer: Self-pay | Admitting: Cardiology

## 2012-12-28 ENCOUNTER — Telehealth: Payer: Self-pay | Admitting: Cardiology

## 2012-12-28 NOTE — Telephone Encounter (Signed)
One month supply of 10 MG Crestor upfront for pt to pick up. LVM to make pt aware.

## 2012-12-28 NOTE — Telephone Encounter (Signed)
New Prob    Pt requesting some more samples of Crestor.

## 2012-12-31 ENCOUNTER — Other Ambulatory Visit: Payer: Self-pay | Admitting: Cardiology

## 2013-01-24 ENCOUNTER — Telehealth: Payer: Self-pay | Admitting: Cardiology

## 2013-01-24 ENCOUNTER — Ambulatory Visit (INDEPENDENT_AMBULATORY_CARE_PROVIDER_SITE_OTHER): Payer: Medicare Other | Admitting: Cardiothoracic Surgery

## 2013-01-24 ENCOUNTER — Telehealth: Payer: Self-pay | Admitting: *Deleted

## 2013-01-24 ENCOUNTER — Other Ambulatory Visit: Payer: Self-pay | Admitting: *Deleted

## 2013-01-24 ENCOUNTER — Encounter: Payer: Self-pay | Admitting: Cardiothoracic Surgery

## 2013-01-24 VITALS — BP 124/73 | HR 66 | Resp 20 | Ht 59.0 in | Wt 148.0 lb

## 2013-01-24 DIAGNOSIS — R222 Localized swelling, mass and lump, trunk: Secondary | ICD-10-CM

## 2013-01-24 DIAGNOSIS — J9859 Other diseases of mediastinum, not elsewhere classified: Secondary | ICD-10-CM

## 2013-01-24 NOTE — Patient Instructions (Signed)
Stop Plavix last dose January 18  Stop fish oil tablets now  Continue aspirin until day before surgery

## 2013-01-24 NOTE — Telephone Encounter (Signed)
Patient requests crestor samples. She is aware that they will be left at the front desk for pick up. I will also include a patient assistance form for her to fill out and a paper about the ssa assistance.

## 2013-01-24 NOTE — Progress Notes (Signed)
PCP is Reginia Naas, MD Referring Provider is Irene Shipper, MD  Chief Complaint  Patient presents with  . Mediastinal Mass    Further discuss surgery    HPI: 68 year old short obese Caucasian female with a 5.5 cm anterior mediastinal mass, negative activity and PET scan, consistent with thymic or pericardial cyst.  Patient was treated for a high-grade LAD stenosis March 2014 was placed on Plavix for the drug-eluting stent. 2-D echocardiogram showed good LV function without significant valvular disease. A stress test later in 2014 was negative for ischemia. She is having some atypical chest pain felt to be due to GI etiology-esophageal strictures and reflux- as well as cystic mass in her anterior mediastinum. She now wishes to proceed to have the cystic mass removed. According to the patient, her cardiologist Dr. Radford Pax stated that it would be safe for her to stop the Plavix for 7 days prior to elective surgery and to continue the aspirin--she is almost 1 year following drug-eluting stent placement.  No recent respiratory complaints fever change in weight. She does have some dyspnea exertion. FEV1 and FVC are 70% of predicted and DLCO is 53%. She is a nonsmoker but has had some bronchitis over the years.  Her current cardiac function is good. She is in sinus rhythm with good EF. She has a slight flow murmur without significant valvular pathology.  Her surgical history is significant for only urgent laparotomy for tubal pregnancy. No anesthesia or bleeding problems. Full  Past Medical History  Diagnosis Date  . Irritable bowel syndrome   . Esophageal reflux   . Dysphagia, unspecified(787.20)   . Hypertension   . Depression   . Hypothyroidism   . Seizures   . Iron deficiency anemia   . Internal hemorrhoids   . Coronary artery disease     coronary angiogram on 03/14/12 showed Normal left main coronary artery, 50% proximal and 70% mid stenosis of the LAD and distally at takeoff of  second diagonal there is a 50-70% stenosis in a very tortuous LAD, 70-80% ostial stenosis of the left circumflex in a small vessel, and 50% instent restenosis of the mid to distal portion of the RCA stent and 50-70% stenosis of the ostial PDA.   Marland Kitchen Anxiety   . Arthritis   . Blood transfusion 1960  . Heart murmur   . Hyperlipidemia   . Myocardial infarction 11/2009    1 stent placed  . Esophageal stricture   . Hiatal hernia   . Shortness of breath     Past Surgical History  Procedure Laterality Date  . Breast surgery  2010     benign cysts  . Coronary angioplasty with stent placement  2011 and 2012    drug-eluting stent w/ ion stents to the distal right coronary artery    Family History  Problem Relation Age of Onset  . Breast cancer Sister   . Breast cancer      niece  . Heart disease Father   . Colon cancer Neg Hx   . Heart attack Mother     Social History History  Substance Use Topics  . Smoking status: Former Smoker -- 0.30 packs/day for 3 years    Types: Cigarettes    Start date: 11/08/1964    Quit date: 11/09/1967  . Smokeless tobacco: Never Used  . Alcohol Use: No    Current Outpatient Prescriptions  Medication Sig Dispense Refill  . ALPRAZolam (XANAX) 0.25 MG tablet Take 0.25 mg by mouth at bedtime  as needed for anxiety.       Marland Kitchen aspirin EC 81 MG tablet Take 81 mg by mouth daily.      . calcitRIOL (ROCALTROL) 0.5 MCG capsule Take 0.5 mcg by mouth daily.       . Calcium Carbonate-Vitamin D (CALCIUM + D PO) Take 1 tablet by mouth daily.      . clopidogrel (PLAVIX) 75 MG tablet TAKE 1 TABLET BY MOUTH EVERY DAY  30 tablet  6  . diltiazem (MATZIM LA) 240 MG 24 hr tablet Take 240 mg by mouth daily.      Marland Kitchen docusate sodium (COLACE) 100 MG capsule Take 100 mg by mouth at bedtime.      Marland Kitchen doxazosin (CARDURA) 2 MG tablet TAKE 1 TABLET BY MOUTH EVERY DAY  30 tablet  5  . isosorbide mononitrate (IMDUR) 60 MG 24 hr tablet Take 60 mg by mouth daily. Patient takes 90 mg once a  day.      . levETIRAcetam (KEPPRA) 500 MG tablet Take 500 mg by mouth 2 (two) times daily.       Marland Kitchen levothyroxine (SYNTHROID, LEVOTHROID) 100 MCG tablet Take 100 mcg by mouth daily before breakfast.       . losartan (COZAAR) 100 MG tablet Take 100 mg by mouth daily.      . metoprolol succinate (TOPROL-XL) 25 MG 24 hr tablet Take 25 mg by mouth daily.       . nitroGLYCERIN (NITROSTAT) 0.4 MG SL tablet Place 0.4 mg under the tongue every 5 (five) minutes as needed for chest pain.      . pantoprazole (PROTONIX) 40 MG tablet Take 40 mg by mouth 2 (two) times daily.      Marland Kitchen PARoxetine (PAXIL) 30 MG tablet Take 30 mg by mouth daily.       . potassium chloride SA (K-DUR,KLOR-CON) 20 MEQ tablet Take 20 mEq by mouth 2 (two) times daily.      . rosuvastatin (CRESTOR) 10 MG tablet Take 10 mg by mouth every morning.       . vitamin E 400 UNIT capsule Take 800 Units by mouth daily.        No current facility-administered medications for this visit.    No Known Allergies  Review of Systems no weight loss or fever Ambulating daily Gained a few pounds over the holidays   has discomfort in her anterior chest sleeping at night   stable dyspnea on exertion Has had esophageal dilatation with the past year with improved swallowing     BP 124/73  Pulse 66  Resp 20  Ht 4\' 11"  (1.499 m)  Wt 148 lb (67.132 kg)  BMI 29.88 kg/m2  SpO2 97% Physical Exam  alert and comfortable   HEENT normocephalic pupils equal dentition good Neck without JVD mass or bruit Thorax without tenderness or deformity, breath sounds clear bilaterally Cardiac rhythm regular soft 1/6 flow murmur Abdomen obese soft nontender Extremities without edema or cyanosis or tenderness Vascular with good pulses in the extremities Neuro focal motor deficit except for slight decreased right hand grip related to a IM injection and nerve injury as a child   Diagnostic Tests:  CT scan, PET scan, coronary angiogram, 2-D echocardiogram all  reviewed   Impression:  5.5 cm anterior mediastinal mass with minimal activity on PET scan consistent with cystic structure. Patient has persistent atypical chest pain and resection is felt to be appropriate. Procedure indications benefits alternatives and risks discussed with patient and husband and  she agrees.   Plan  -  Surgery will be scheduled for January 26 at [facility]-median sternotomy for resection of mass

## 2013-01-24 NOTE — Telephone Encounter (Signed)
New message     On 02-05-13 pt will have chest surgery by Dr Nils Pyle

## 2013-01-24 NOTE — Telephone Encounter (Signed)
To Dr Turner as a FYI 

## 2013-01-26 ENCOUNTER — Encounter: Payer: Self-pay | Admitting: Internal Medicine

## 2013-01-26 ENCOUNTER — Ambulatory Visit (INDEPENDENT_AMBULATORY_CARE_PROVIDER_SITE_OTHER): Payer: Medicare Other | Admitting: Internal Medicine

## 2013-01-26 VITALS — BP 138/74 | HR 84 | Ht 59.0 in | Wt 152.4 lb

## 2013-01-26 DIAGNOSIS — K219 Gastro-esophageal reflux disease without esophagitis: Secondary | ICD-10-CM | POA: Diagnosis not present

## 2013-01-26 DIAGNOSIS — R222 Localized swelling, mass and lump, trunk: Secondary | ICD-10-CM

## 2013-01-26 DIAGNOSIS — R1310 Dysphagia, unspecified: Secondary | ICD-10-CM

## 2013-01-26 MED ORDER — PANTOPRAZOLE SODIUM 40 MG PO TBEC: 40.0000 mg | DELAYED_RELEASE_TABLET | Freq: Two times a day (BID) | ORAL | Status: DC

## 2013-01-26 NOTE — Patient Instructions (Signed)
We have sent the following medications to your pharmacy for you to pick up at your convenience:  Protonix  Please follow up with Dr. Perry as needed  

## 2013-01-26 NOTE — Progress Notes (Signed)
HISTORY OF PRESENT ILLNESS:  [name] is a 68 y.o. female with multiple medical problems as listed below. She presents today for followup regarding GERD and medication refill. She was last seen in the office 10/30/2012 regarding chronic chest pain after negative cardiology evaluation. The etiology was of her chest pain unclear to me, but not felt to be GI in origin. We did set her up for an ultrasound which was unrevealing. No gallstones. Because of complaints of shortness of breath I ordered a CT angiogram of the chest to rule out pulmonary embolus. The exam was negative for PE that revealed a mediastinal mass. She has had thoracic surgery consultation and is anticipating median sternotomy with removal of the mass (felt to be cystic after no hypermetabolic activity on PET scan). Terms of her GERD, symptoms controlled on PPI. Still with chronic dysphagia, though not bad. No lower GI complaints. She is accompanied by her husband who requires about his surveillance colonoscopy.  REVIEW OF SYSTEMS:  All non-GI ROS negative except for anxiety, arthritis, back pain, depression, heart murmur, night sweats, short of breath, insomnia  Past Medical History  Diagnosis Date  . Irritable bowel syndrome   . Esophageal reflux   . Dysphagia, unspecified(787.20)   . Hypertension   . Depression   . Hypothyroidism   . Seizures   . Iron deficiency anemia   . Internal hemorrhoids   . Coronary artery disease     coronary angiogram on 03/14/12 showed Normal left main coronary artery, 50% proximal and 70% mid stenosis of the LAD and distally at takeoff of second diagonal there is a 50-70% stenosis in a very tortuous LAD, 70-80% ostial stenosis of the left circumflex in a small vessel, and 50% instent restenosis of the mid to distal portion of the RCA stent and 50-70% stenosis of the ostial PDA.   Marland Kitchen Anxiety   . Arthritis   . Blood transfusion 1960  . Heart murmur   . Hyperlipidemia   . Myocardial infarction  11/2009    1 stent placed  . Esophageal stricture   . Hiatal hernia   . Shortness of breath     Past Surgical History  Procedure Laterality Date  . Breast surgery  2010     benign cysts  . Coronary angioplasty with stent placement  2011 and 2012    drug-eluting stent w/ ion stents to the distal right coronary artery    Social History [name] [name]  reports that she quit smoking about 45 years ago. Her smoking use included Cigarettes. She started smoking about 48 years ago. She has a .9 pack-year smoking history. She has never used smokeless tobacco. She reports that she does not drink alcohol or use illicit drugs.  family history includes Breast cancer in her sister and another family member; Heart attack in her mother; Heart disease in her father. There is no history of Colon cancer.  No Known Allergies     PHYSICAL EXAMINATION: Vital signs: BP 138/74  Pulse 84  Ht 4\' 11"  (1.499 m)  Wt 152 lb 6.4 oz (69.128 kg)  BMI 30.76 kg/m2 General: Well-developed, well-nourished, no acute distress HEENT: Sclerae are anicteric, conjunctiva pink. Oral mucosa intact Lungs: Clear Heart: Regular Abdomen: soft, nontender, nondistended, no obvious ascites, no peritoneal signs, normal bowel sounds. No organomegaly. Extremities: No edema Psychiatric: alert and oriented x3. Cooperative   ASSESSMENT:  #1. GERD. Asymptomatic on PPI #2. History of chronic dysphagia likely a combination motility and stricture. Previous dilations.  Last endoscopy with dilation May 2013 #3. Chronic chest pain. Recent mediastinal mass identified with anticipated resection upcoming #4. Multiple medical problems #5. Last colonoscopy December 2011. Normal  PLAN:  #1. Reflux precautions #2. Refill pantoprazole 40 mg twice a day #3. Resume general medical care with PCP in specialist. Routine GI followup in 1 year

## 2013-01-29 ENCOUNTER — Encounter (HOSPITAL_COMMUNITY): Payer: Self-pay | Admitting: Pharmacy Technician

## 2013-01-31 ENCOUNTER — Other Ambulatory Visit (HOSPITAL_COMMUNITY): Payer: Self-pay | Admitting: *Deleted

## 2013-01-31 NOTE — Pre-Procedure Instructions (Signed)
[name]  01/31/2013   Your procedure is scheduled on:  Monday, February 05, 2013 at 7:30 AM.   Report to [facility] Entrance "A" Admitting Office at 5:30 AM.   Call this number if you have problems the morning of surgery: [phone]   Remember:   Do not eat food or drink liquids after midnight Sunday, 02/04/13.   Take these medicines the morning of surgery with A SIP OF WATER: diltiazem (MATZIM LA),  doxazosin (CARDURA), isosorbide mononitrate (IMDUR), levETIRAcetam (KEPPRA), levothyroxine (SYNTHROID, LEVOTHROID), metoprolol succinate (TOPROL-XL),  pantoprazole (PROTONIX), PARoxetine (PAXIL), nitroGLYCERIN (NITROSTAT) - if needed.  Instructed by Dr Prescott Gum to take Lasartan morning.  Stop Vitamins, Aspirin and Plavix ,Fish oil and  Vitamin E stopped as of today    Do not wear jewelry, make-up or nail polish.  Do not wear lotions, powders, or perfumes. You may wear deodorant.  Do not shave 48 hours prior to surgery.  Do not bring valuables to the hospital.  [facility] is not responsible for any belongings or valuables.               Contacts, dentures or bridgework may not be worn into surgery.  Leave suitcase in the car. After surgery it may be brought to your room.  For patients admitted to the hospital, discharge time is determined by your  treatment team.               Special Instructions: Shower using CHG 2 nights before surgery and the night before surgery.  If you shower the day of surgery use CHG.  Use special wash - you have one bottle of CHG for all showers.  You should use approximately 1/3 of the bottle for each shower.   Please read over the following fact sheets that you were given: Pain Booklet, Coughing and Deep Breathing, Blood Transfusion Information, MRSA Information and Surgical Site Infection Prevention

## 2013-02-01 ENCOUNTER — Encounter (HOSPITAL_COMMUNITY)
Admission: RE | Admit: 2013-02-01 | Discharge: 2013-02-01 | Disposition: A | Discharge status: Home / Self Care | Payer: Medicare Other | Source: Ambulatory Visit | Attending: Cardiothoracic Surgery | Admitting: Cardiothoracic Surgery

## 2013-02-01 ENCOUNTER — Encounter (HOSPITAL_COMMUNITY): Payer: Self-pay

## 2013-02-01 VITALS — BP 132/79 | HR 65 | Temp 98.1°F | Resp 20 | Ht 59.0 in | Wt 151.4 lb

## 2013-02-01 DIAGNOSIS — Z01818 Encounter for other preprocedural examination: Secondary | ICD-10-CM | POA: Insufficient documentation

## 2013-02-01 DIAGNOSIS — J9859 Other diseases of mediastinum, not elsewhere classified: Secondary | ICD-10-CM

## 2013-02-01 DIAGNOSIS — Z01812 Encounter for preprocedural laboratory examination: Secondary | ICD-10-CM | POA: Diagnosis not present

## 2013-02-01 DIAGNOSIS — Z01810 Encounter for preprocedural cardiovascular examination: Secondary | ICD-10-CM | POA: Insufficient documentation

## 2013-02-01 HISTORY — DX: Other specified postprocedural states: R11.2

## 2013-02-01 HISTORY — DX: Other specified postprocedural states: Z98.890

## 2013-02-01 LAB — COMPREHENSIVE METABOLIC PANEL
ALT: 9 U/L (ref 0–35)
AST: 15 U/L (ref 0–37)
Albumin: 3.4 g/dL — ABNORMAL LOW (ref 3.5–5.2)
Alkaline Phosphatase: 78 U/L (ref 39–117)
BUN: 21 mg/dL (ref 6–23)
CO2: 22 mEq/L (ref 19–32)
Calcium: 9.5 mg/dL (ref 8.4–10.5)
Chloride: 106 mEq/L (ref 96–112)
Creatinine, Ser: 1.19 mg/dL — ABNORMAL HIGH (ref 0.50–1.10)
GFR calc Af Amer: 54 mL/min — ABNORMAL LOW (ref >90)
GFR calc non Af Amer: 47 mL/min — ABNORMAL LOW (ref >90)
Glucose, Bld: 108 mg/dL — ABNORMAL HIGH (ref 70–99)
Potassium: 4.3 mEq/L (ref 3.7–5.3)
Sodium: 142 mEq/L (ref 137–147)
Total Bilirubin: 0.3 mg/dL (ref 0.3–1.2)
Total Protein: 6.9 g/dL (ref 6.0–8.3)

## 2013-02-01 LAB — URINALYSIS, ROUTINE W REFLEX MICROSCOPIC
Bilirubin Urine: NEGATIVE
Glucose, UA: NEGATIVE mg/dL
Ketones, ur: NEGATIVE mg/dL
Leukocytes, UA: NEGATIVE
Nitrite: NEGATIVE
Protein, ur: NEGATIVE mg/dL
Specific Gravity, Urine: 1.023 (ref 1.005–1.030)
Urobilinogen, UA: 0.2 mg/dL (ref 0.0–1.0)
pH: 6.5 (ref 5.0–8.0)

## 2013-02-01 LAB — BLOOD GAS, ARTERIAL
Acid-Base Excess: 1.3 mmol/L (ref 0.0–2.0)
Bicarbonate: 25.2 mEq/L — ABNORMAL HIGH (ref 20.0–24.0)
Drawn by: 181601
O2 Saturation: 98.2 %
Patient temperature: 98.6
TCO2: 26.4 mmol/L (ref 0–100)
pCO2 arterial: 38.8 mmHg (ref 35.0–45.0)
pH, Arterial: 7.429 (ref 7.350–7.450)
pO2, Arterial: 99.8 mmHg (ref 80.0–100.0)

## 2013-02-01 LAB — PROTIME-INR
INR: 0.99 (ref 0.00–1.49)
Prothrombin Time: 12.9 seconds (ref 11.6–15.2)

## 2013-02-01 LAB — URINE MICROSCOPIC-ADD ON

## 2013-02-01 LAB — CBC
HCT: 33.9 % — ABNORMAL LOW (ref 36.0–46.0)
Hemoglobin: 11.5 g/dL — ABNORMAL LOW (ref 12.0–15.0)
MCH: 29.6 pg (ref 26.0–34.0)
MCHC: 33.9 g/dL (ref 30.0–36.0)
MCV: 87.1 fL (ref 78.0–100.0)
Platelets: 222 10*3/uL (ref 150–400)
RBC: 3.89 MIL/uL (ref 3.87–5.11)
RDW: 12.7 % (ref 11.5–15.5)
WBC: 6.4 10*3/uL (ref 4.0–10.5)

## 2013-02-01 LAB — APTT: aPTT: 27 seconds (ref 24–37)

## 2013-02-01 LAB — SURGICAL PCR SCREEN
MRSA, PCR: NEGATIVE
Staphylococcus aureus: NEGATIVE

## 2013-02-01 NOTE — Progress Notes (Signed)
Type and screen needs to be repeated day of surgery per lab due to specimen rejection .

## 2013-02-02 DIAGNOSIS — M542 Cervicalgia: Secondary | ICD-10-CM | POA: Diagnosis not present

## 2013-02-02 DIAGNOSIS — M4310 Spondylolisthesis, site unspecified: Secondary | ICD-10-CM | POA: Diagnosis not present

## 2013-02-02 DIAGNOSIS — G894 Chronic pain syndrome: Secondary | ICD-10-CM | POA: Diagnosis not present

## 2013-02-02 NOTE — Progress Notes (Signed)
Anesthesia Chart Review:  Patient is a 68 year old female scheduled for median sternotomy for resection of mediastinal mass on 02/05/13 by Dr. Prescott Gum. Mass if suspected to be either a thymic or pericardial cyst, but definitive pathology is desired.  History includes former smoker, CAD/MI s/p DES distal RCA 06/26/10 and DES mid LAD 04/03/12, murmur, hypothyroidism, IBS, HTN, seizures, iron deficiency anemia, esophageal stricture s/p dilatation, dysphagia, GERD, depression. PCP is Dr. Carol Ada. Cardiologist is Dr. Fransico Him. According to Dr. Lucianne Lei Trigt's 01/24/13 note, Dr. Radford Pax felt it would be safe for her to stop Plavix for 7 days prior to surgery and continue ASA.  EKG on 02/01/13 showed SB @ 57 bpm, LAD, poor anterior R wave progression.  EKG was felt stable since her last tracing.  Nuclear stress test on 10/24/12 showed: 1. No evidence of inducible/ reversible ischemia.  2. Normal cardiac wall motion.  3. Calculated ejection fraction 84%.  Coronary angiogram on 03/14/12 showed: Normal left main coronary artery, 50% proximal and 70% mid stenosis of the LAD and distally at takeoff of second diagonal there is a 50-70% stenosis in a very tortuous LAD, 70-80% ostial stenosis of the left circumflex in a small vessel, and 50% instent restenosis of the mid to distal portion of the RCA stent and 50-70% stenosis of the ostial PDA. Normal LV systolic function, EF 30%. She underwent successful PCI of the mid LAD with a Promus stent on 04/03/12.  Echo on 11/17/11 showed: - Left ventricle: The cavity size was normal. Systolic function was normal. The estimated ejection fraction was in the range of 55% to 65%. Wall motion was normal; there were no regional wall motion abnormalities. Doppler parameters are consistent with abnormal left ventricular relaxation (grade 1 diastolic dysfunction). Doppler parameters are consistent with high ventricular filling pressure. - Pulmonary arteries: PA peak pressure: 39mm Hg  (S). - Pericardium, extracardiac: A trivial pericardial effusion was identified posterior to the heart.  PFTs 11/17/12 showed her pulmonary function be fairly well preserved with FVC 72% FEV1 73% of predicted DLCO 53%.   Preoperative labs noted.    She needs a repeat T&S on the day of surgery to to "specimen rejection" by the lab.  She also needs a CXR as well.  Her cardiologist is aware of plans for surgery.  Anticipate that she can proceed as planned if no acute changes.  George Hugh [facility] Short Stay Center/Anesthesiology Phone [phone] 02/02/2013 11:08 AM

## 2013-02-04 MED ORDER — CHLORHEXIDINE GLUCONATE CLOTH 2 % EX PADS: 6.0000 | MEDICATED_PAD | Freq: Once | CUTANEOUS | Status: DC

## 2013-02-04 MED ORDER — DEXTROSE 5 % IV SOLN
1.5000 g | INTRAVENOUS | Status: AC
  Administered 2013-02-05: 1.5 g via INTRAVENOUS
  Filled 2013-02-04: qty 1.5

## 2013-02-05 ENCOUNTER — Inpatient Hospital Stay (HOSPITAL_COMMUNITY): Payer: Medicare Other

## 2013-02-05 ENCOUNTER — Inpatient Hospital Stay (HOSPITAL_COMMUNITY)
Admission: RE | Admit: 2013-02-05 | Discharge: 2013-02-10 | DRG: 238 | Disposition: A | Discharge status: Home / Self Care | Payer: Medicare Other | Source: Ambulatory Visit | Attending: Cardiothoracic Surgery | Admitting: Cardiothoracic Surgery

## 2013-02-05 ENCOUNTER — Encounter (HOSPITAL_COMMUNITY): Payer: Self-pay | Admitting: *Deleted

## 2013-02-05 ENCOUNTER — Encounter (HOSPITAL_COMMUNITY): Payer: Medicare Other | Admitting: Vascular Surgery

## 2013-02-05 ENCOUNTER — Inpatient Hospital Stay (HOSPITAL_COMMUNITY): Payer: Medicare Other | Admitting: Certified Registered Nurse Anesthetist

## 2013-02-05 ENCOUNTER — Encounter (HOSPITAL_COMMUNITY)
Admission: RE | Disposition: A | Discharge status: Home / Self Care | Payer: Self-pay | Source: Ambulatory Visit | Attending: Cardiothoracic Surgery

## 2013-02-05 ENCOUNTER — Telehealth: Payer: Self-pay | Admitting: *Deleted

## 2013-02-05 DIAGNOSIS — R1310 Dysphagia, unspecified: Secondary | ICD-10-CM | POA: Diagnosis present

## 2013-02-05 DIAGNOSIS — K589 Irritable bowel syndrome without diarrhea: Secondary | ICD-10-CM | POA: Diagnosis present

## 2013-02-05 DIAGNOSIS — I318 Other specified diseases of pericardium: Secondary | ICD-10-CM | POA: Diagnosis not present

## 2013-02-05 DIAGNOSIS — Z87891 Personal history of nicotine dependence: Secondary | ICD-10-CM | POA: Diagnosis not present

## 2013-02-05 DIAGNOSIS — J9859 Other diseases of mediastinum, not elsewhere classified: Secondary | ICD-10-CM | POA: Diagnosis present

## 2013-02-05 DIAGNOSIS — E039 Hypothyroidism, unspecified: Secondary | ICD-10-CM | POA: Diagnosis present

## 2013-02-05 DIAGNOSIS — Z6830 Body mass index (BMI) 30.0-30.9, adult: Secondary | ICD-10-CM | POA: Diagnosis not present

## 2013-02-05 DIAGNOSIS — F3289 Other specified depressive episodes: Secondary | ICD-10-CM | POA: Diagnosis present

## 2013-02-05 DIAGNOSIS — E669 Obesity, unspecified: Secondary | ICD-10-CM | POA: Diagnosis present

## 2013-02-05 DIAGNOSIS — Z8249 Family history of ischemic heart disease and other diseases of the circulatory system: Secondary | ICD-10-CM | POA: Diagnosis not present

## 2013-02-05 DIAGNOSIS — K219 Gastro-esophageal reflux disease without esophagitis: Secondary | ICD-10-CM | POA: Diagnosis not present

## 2013-02-05 DIAGNOSIS — Z7902 Long term (current) use of antithrombotics/antiplatelets: Secondary | ICD-10-CM

## 2013-02-05 DIAGNOSIS — Z803 Family history of malignant neoplasm of breast: Secondary | ICD-10-CM

## 2013-02-05 DIAGNOSIS — F329 Major depressive disorder, single episode, unspecified: Secondary | ICD-10-CM | POA: Diagnosis present

## 2013-02-05 DIAGNOSIS — F411 Generalized anxiety disorder: Secondary | ICD-10-CM | POA: Diagnosis present

## 2013-02-05 DIAGNOSIS — D62 Acute posthemorrhagic anemia: Secondary | ICD-10-CM | POA: Diagnosis not present

## 2013-02-05 DIAGNOSIS — I10 Essential (primary) hypertension: Secondary | ICD-10-CM | POA: Diagnosis present

## 2013-02-05 DIAGNOSIS — I252 Old myocardial infarction: Secondary | ICD-10-CM

## 2013-02-05 DIAGNOSIS — Z01818 Encounter for other preprocedural examination: Secondary | ICD-10-CM | POA: Diagnosis not present

## 2013-02-05 DIAGNOSIS — Z9861 Coronary angioplasty status: Secondary | ICD-10-CM

## 2013-02-05 DIAGNOSIS — I2510 Atherosclerotic heart disease of native coronary artery without angina pectoris: Secondary | ICD-10-CM | POA: Diagnosis present

## 2013-02-05 DIAGNOSIS — R569 Unspecified convulsions: Secondary | ICD-10-CM | POA: Diagnosis present

## 2013-02-05 DIAGNOSIS — K449 Diaphragmatic hernia without obstruction or gangrene: Secondary | ICD-10-CM | POA: Diagnosis present

## 2013-02-05 DIAGNOSIS — R222 Localized swelling, mass and lump, trunk: Secondary | ICD-10-CM

## 2013-02-05 DIAGNOSIS — E328 Other diseases of thymus: Secondary | ICD-10-CM | POA: Diagnosis not present

## 2013-02-05 DIAGNOSIS — J90 Pleural effusion, not elsewhere classified: Secondary | ICD-10-CM | POA: Diagnosis not present

## 2013-02-05 DIAGNOSIS — J9819 Other pulmonary collapse: Secondary | ICD-10-CM | POA: Diagnosis not present

## 2013-02-05 DIAGNOSIS — Z4682 Encounter for fitting and adjustment of non-vascular catheter: Secondary | ICD-10-CM | POA: Diagnosis not present

## 2013-02-05 HISTORY — PX: BIOPSY OF MEDIASTINAL MASS: SHX6389

## 2013-02-05 HISTORY — PX: MEDIASTERNOTOMY: SHX5084

## 2013-02-05 LAB — POCT I-STAT 3, ART BLOOD GAS (G3+)
Acid-base deficit: 1 mmol/L (ref 0.0–2.0)
Acid-base deficit: 2 mmol/L (ref 0.0–2.0)
Acid-base deficit: 3 mmol/L — ABNORMAL HIGH (ref 0.0–2.0)
Bicarbonate: 25.6 mEq/L — ABNORMAL HIGH (ref 20.0–24.0)
Bicarbonate: 24.6 mEq/L — ABNORMAL HIGH (ref 20.0–24.0)
Bicarbonate: 23.6 mEq/L (ref 20.0–24.0)
O2 Saturation: 94 %
O2 Saturation: 98 %
O2 Saturation: 96 %
Patient temperature: 97.4
Patient temperature: 97.4
Patient temperature: 97.4
TCO2: 27 mmol/L (ref 0–100)
TCO2: 26 mmol/L (ref 0–100)
TCO2: 25 mmol/L (ref 0–100)
pCO2 arterial: 47.9 mmHg — ABNORMAL HIGH (ref 35.0–45.0)
pCO2 arterial: 46.5 mmHg — ABNORMAL HIGH (ref 35.0–45.0)
pCO2 arterial: 45 mmHg (ref 35.0–45.0)
pH, Arterial: 7.332 — ABNORMAL LOW (ref 7.350–7.450)
pH, Arterial: 7.328 — ABNORMAL LOW (ref 7.350–7.450)
pH, Arterial: 7.324 — ABNORMAL LOW (ref 7.350–7.450)
pO2, Arterial: 72 mmHg — ABNORMAL LOW (ref 80.0–100.0)
pO2, Arterial: 121 mmHg — ABNORMAL HIGH (ref 80.0–100.0)
pO2, Arterial: 86 mmHg (ref 80.0–100.0)

## 2013-02-05 LAB — CBC
HCT: 29.6 % — ABNORMAL LOW (ref 36.0–46.0)
Hemoglobin: 10 g/dL — ABNORMAL LOW (ref 12.0–15.0)
MCH: 29.6 pg (ref 26.0–34.0)
MCHC: 33.8 g/dL (ref 30.0–36.0)
MCV: 87.6 fL (ref 78.0–100.0)
Platelets: 178 10*3/uL (ref 150–400)
RBC: 3.38 MIL/uL — ABNORMAL LOW (ref 3.87–5.11)
RDW: 12.8 % (ref 11.5–15.5)
WBC: 13.8 10*3/uL — ABNORMAL HIGH (ref 4.0–10.5)

## 2013-02-05 LAB — BASIC METABOLIC PANEL
BUN: 22 mg/dL (ref 6–23)
CO2: 22 mEq/L (ref 19–32)
Calcium: 8.4 mg/dL (ref 8.4–10.5)
Chloride: 107 mEq/L (ref 96–112)
Creatinine, Ser: 1.15 mg/dL — ABNORMAL HIGH (ref 0.50–1.10)
GFR calc Af Amer: 56 mL/min — ABNORMAL LOW (ref >90)
GFR calc non Af Amer: 48 mL/min — ABNORMAL LOW (ref >90)
Glucose, Bld: 178 mg/dL — ABNORMAL HIGH (ref 70–99)
Potassium: 4.2 mEq/L (ref 3.7–5.3)
Sodium: 142 mEq/L (ref 137–147)

## 2013-02-05 LAB — TYPE AND SCREEN
ABO/RH(D): O POS
Antibody Screen: NEGATIVE

## 2013-02-05 LAB — ABO/RH: ABO/RH(D): O POS

## 2013-02-05 SURGERY — MEDIAN STERNOTOMY
Anesthesia: General

## 2013-02-05 MED ORDER — 0.9 % SODIUM CHLORIDE (POUR BTL) OPTIME
TOPICAL | Status: DC | PRN
  Administered 2013-02-05: 1000 mL

## 2013-02-05 MED ORDER — OXYCODONE HCL 5 MG PO TABS
5.0000 mg | ORAL_TABLET | ORAL | Status: AC | PRN
  Administered 2013-02-05 – 2013-02-06 (×2): 5 mg via ORAL
  Filled 2013-02-05 (×2): qty 1

## 2013-02-05 MED ORDER — ONDANSETRON HCL 4 MG/2ML IJ SOLN
INTRAMUSCULAR | Status: AC
  Filled 2013-02-05: qty 2

## 2013-02-05 MED ORDER — PHENYLEPHRINE HCL 10 MG/ML IJ SOLN
INTRAMUSCULAR | Status: AC
  Filled 2013-02-05: qty 1

## 2013-02-05 MED ORDER — BISACODYL 5 MG PO TBEC
10.0000 mg | DELAYED_RELEASE_TABLET | Freq: Every day | ORAL | Status: DC
  Administered 2013-02-06 – 2013-02-08 (×3): 10 mg via ORAL
  Filled 2013-02-05 (×3): qty 2

## 2013-02-05 MED ORDER — LEVOTHYROXINE SODIUM 100 MCG PO TABS
100.0000 ug | ORAL_TABLET | Freq: Every day | ORAL | Status: DC
  Administered 2013-02-06 – 2013-02-10 (×5): 100 ug via ORAL
  Filled 2013-02-05 (×7): qty 1

## 2013-02-05 MED ORDER — PROPOFOL 10 MG/ML IV BOLUS
INTRAVENOUS | Status: DC | PRN
  Administered 2013-02-05: 100 mg via INTRAVENOUS
  Administered 2013-02-05: 40 mg via INTRAVENOUS

## 2013-02-05 MED ORDER — ACETAMINOPHEN 160 MG/5ML PO SOLN
1000.0000 mg | Freq: Four times a day (QID) | ORAL | Status: AC
  Administered 2013-02-05: 1000 mg via ORAL
  Filled 2013-02-05: qty 40.6

## 2013-02-05 MED ORDER — SODIUM CHLORIDE 0.9 % IJ SOLN
10.0000 mL | Freq: Two times a day (BID) | INTRAMUSCULAR | Status: DC
  Administered 2013-02-05 – 2013-02-09 (×7): 10 mL

## 2013-02-05 MED ORDER — SUCCINYLCHOLINE CHLORIDE 20 MG/ML IJ SOLN
INTRAMUSCULAR | Status: AC
  Filled 2013-02-05: qty 1

## 2013-02-05 MED ORDER — PANTOPRAZOLE SODIUM 40 MG PO TBEC
40.0000 mg | DELAYED_RELEASE_TABLET | Freq: Two times a day (BID) | ORAL | Status: DC
  Administered 2013-02-05 – 2013-02-10 (×10): 40 mg via ORAL
  Filled 2013-02-05 (×10): qty 1

## 2013-02-05 MED ORDER — FENTANYL CITRATE 0.05 MG/ML IJ SOLN
INTRAMUSCULAR | Status: AC
  Filled 2013-02-05: qty 5

## 2013-02-05 MED ORDER — PHENYLEPHRINE HCL 10 MG/ML IJ SOLN
INTRAMUSCULAR | Status: DC | PRN
  Administered 2013-02-05: 40 ug via INTRAVENOUS
  Administered 2013-02-05 (×2): 80 ug via INTRAVENOUS
  Administered 2013-02-05 (×2): 40 ug via INTRAVENOUS
  Administered 2013-02-05: 80 ug via INTRAVENOUS

## 2013-02-05 MED ORDER — DILTIAZEM HCL ER COATED BEADS 240 MG PO TB24
240.0000 mg | ORAL_TABLET | Freq: Every day | ORAL | Status: DC
  Filled 2013-02-05: qty 1

## 2013-02-05 MED ORDER — FENTANYL CITRATE 0.05 MG/ML IJ SOLN
INTRAMUSCULAR | Status: DC | PRN
  Administered 2013-02-05: 50 ug via INTRAVENOUS
  Administered 2013-02-05: 125 ug via INTRAVENOUS
  Administered 2013-02-05 (×2): 50 ug via INTRAVENOUS
  Administered 2013-02-05 (×3): 100 ug via INTRAVENOUS
  Administered 2013-02-05 (×3): 50 ug via INTRAVENOUS
  Administered 2013-02-05: 25 ug via INTRAVENOUS
  Administered 2013-02-05: 100 ug via INTRAVENOUS
  Administered 2013-02-05 (×3): 50 ug via INTRAVENOUS

## 2013-02-05 MED ORDER — DEXMEDETOMIDINE HCL IN NACL 200 MCG/50ML IV SOLN
INTRAVENOUS | Status: DC | PRN
  Administered 2013-02-05: 0.3 ug/kg/h via INTRAVENOUS

## 2013-02-05 MED ORDER — DEXTROSE 5 % IV SOLN
1.5000 g | Freq: Two times a day (BID) | INTRAVENOUS | Status: AC
  Administered 2013-02-05 – 2013-02-06 (×2): 1.5 g via INTRAVENOUS
  Filled 2013-02-05 (×2): qty 1.5

## 2013-02-05 MED ORDER — ROCURONIUM BROMIDE 50 MG/5ML IV SOLN
INTRAVENOUS | Status: AC
  Filled 2013-02-05: qty 1

## 2013-02-05 MED ORDER — MIDAZOLAM HCL 2 MG/2ML IJ SOLN
INTRAMUSCULAR | Status: AC
  Filled 2013-02-05: qty 2

## 2013-02-05 MED ORDER — SODIUM CHLORIDE 0.9 % IJ SOLN
10.0000 mL | INTRAMUSCULAR | Status: DC | PRN
  Administered 2013-02-09: 20 mL

## 2013-02-05 MED ORDER — TRAMADOL HCL 50 MG PO TABS
50.0000 mg | ORAL_TABLET | Freq: Four times a day (QID) | ORAL | Status: DC | PRN
  Administered 2013-02-06: 50 mg via ORAL
  Administered 2013-02-06 – 2013-02-07 (×2): 100 mg via ORAL
  Filled 2013-02-05: qty 2
  Filled 2013-02-05: qty 1
  Filled 2013-02-05: qty 2

## 2013-02-05 MED ORDER — ASPIRIN EC 325 MG PO TBEC
DELAYED_RELEASE_TABLET | ORAL | Status: AC
  Administered 2013-02-05: 325 mg via ORAL
  Filled 2013-02-05: qty 1

## 2013-02-05 MED ORDER — NEOSTIGMINE METHYLSULFATE 1 MG/ML IJ SOLN
INTRAMUSCULAR | Status: AC
  Filled 2013-02-05: qty 10

## 2013-02-05 MED ORDER — ATORVASTATIN CALCIUM 20 MG PO TABS
20.0000 mg | ORAL_TABLET | Freq: Every day | ORAL | Status: DC
  Administered 2013-02-06 – 2013-02-09 (×4): 20 mg via ORAL
  Filled 2013-02-05 (×5): qty 1

## 2013-02-05 MED ORDER — LACTATED RINGERS IV SOLN
INTRAVENOUS | Status: DC | PRN
  Administered 2013-02-05 (×2): via INTRAVENOUS

## 2013-02-05 MED ORDER — POTASSIUM CHLORIDE 10 MEQ/50ML IV SOLN
10.0000 meq | Freq: Every day | INTRAVENOUS | Status: DC | PRN
  Filled 2013-02-05: qty 50

## 2013-02-05 MED ORDER — SODIUM CHLORIDE 0.9 % IJ SOLN
INTRAMUSCULAR | Status: AC
  Filled 2013-02-05: qty 20

## 2013-02-05 MED ORDER — LIDOCAINE HCL (CARDIAC) 20 MG/ML IV SOLN
INTRAVENOUS | Status: AC
  Filled 2013-02-05: qty 5

## 2013-02-05 MED ORDER — DEXMEDETOMIDINE HCL IN NACL 200 MCG/50ML IV SOLN
INTRAVENOUS | Status: AC
  Filled 2013-02-05: qty 50

## 2013-02-05 MED ORDER — DOXAZOSIN MESYLATE 2 MG PO TABS
2.0000 mg | ORAL_TABLET | Freq: Every day | ORAL | Status: DC
  Administered 2013-02-06 – 2013-02-10 (×5): 2 mg via ORAL
  Filled 2013-02-05 (×5): qty 1

## 2013-02-05 MED ORDER — PHENYLEPHRINE 40 MCG/ML (10ML) SYRINGE FOR IV PUSH (FOR BLOOD PRESSURE SUPPORT)
PREFILLED_SYRINGE | INTRAVENOUS | Status: AC
  Filled 2013-02-05: qty 10

## 2013-02-05 MED ORDER — ROCURONIUM BROMIDE 100 MG/10ML IV SOLN
INTRAVENOUS | Status: DC | PRN
  Administered 2013-02-05: 20 mg via INTRAVENOUS
  Administered 2013-02-05: 30 mg via INTRAVENOUS
  Administered 2013-02-05: 20 mg via INTRAVENOUS

## 2013-02-05 MED ORDER — PROPOFOL 10 MG/ML IV BOLUS
INTRAVENOUS | Status: AC
  Filled 2013-02-05: qty 20

## 2013-02-05 MED ORDER — HEMOSTATIC AGENTS (NO CHARGE) OPTIME
TOPICAL | Status: DC | PRN
  Administered 2013-02-05: 1 via TOPICAL

## 2013-02-05 MED ORDER — MIDAZOLAM HCL 5 MG/5ML IJ SOLN
INTRAMUSCULAR | Status: DC | PRN
  Administered 2013-02-05 (×2): 1 mg via INTRAVENOUS

## 2013-02-05 MED ORDER — LACTATED RINGERS IV SOLN
INTRAVENOUS | Status: DC | PRN
  Administered 2013-02-05: 08:00:00 via INTRAVENOUS

## 2013-02-05 MED ORDER — EPHEDRINE SULFATE 50 MG/ML IJ SOLN
INTRAMUSCULAR | Status: AC
  Filled 2013-02-05: qty 1

## 2013-02-05 MED ORDER — ALBUMIN HUMAN 5 % IV SOLN
12.5000 g | Freq: Once | INTRAVENOUS | Status: AC
  Administered 2013-02-05: 12.5 g via INTRAVENOUS
  Filled 2013-02-05: qty 250

## 2013-02-05 MED ORDER — DOCUSATE SODIUM 100 MG PO CAPS
100.0000 mg | ORAL_CAPSULE | Freq: Every day | ORAL | Status: DC
  Administered 2013-02-06 – 2013-02-07 (×2): 100 mg via ORAL
  Filled 2013-02-05 (×6): qty 1

## 2013-02-05 MED ORDER — ARTIFICIAL TEARS OP OINT
TOPICAL_OINTMENT | OPHTHALMIC | Status: DC | PRN
  Administered 2013-02-05: 1 via OPHTHALMIC

## 2013-02-05 MED ORDER — PAROXETINE HCL 30 MG PO TABS
30.0000 mg | ORAL_TABLET | Freq: Every day | ORAL | Status: DC
  Administered 2013-02-06 – 2013-02-10 (×5): 30 mg via ORAL
  Filled 2013-02-05 (×6): qty 1

## 2013-02-05 MED ORDER — EPHEDRINE SULFATE 50 MG/ML IJ SOLN
INTRAMUSCULAR | Status: DC | PRN
  Administered 2013-02-05 (×5): 5 mg via INTRAVENOUS

## 2013-02-05 MED ORDER — ONDANSETRON HCL 4 MG/2ML IJ SOLN
4.0000 mg | Freq: Four times a day (QID) | INTRAMUSCULAR | Status: DC | PRN
  Administered 2013-02-06 (×2): 4 mg via INTRAVENOUS
  Filled 2013-02-05 (×2): qty 2

## 2013-02-05 MED ORDER — CALCITRIOL 0.5 MCG PO CAPS
0.5000 ug | ORAL_CAPSULE | Freq: Every day | ORAL | Status: DC
  Administered 2013-02-06 – 2013-02-10 (×5): 0.5 ug via ORAL
  Filled 2013-02-05 (×5): qty 1

## 2013-02-05 MED ORDER — OXYCODONE-ACETAMINOPHEN 5-325 MG PO TABS
1.0000 | ORAL_TABLET | ORAL | Status: DC | PRN
  Administered 2013-02-06 (×2): 2 via ORAL
  Filled 2013-02-05 (×2): qty 2

## 2013-02-05 MED ORDER — SUCCINYLCHOLINE CHLORIDE 20 MG/ML IJ SOLN
INTRAMUSCULAR | Status: DC | PRN
  Administered 2013-02-05: 50 mg via INTRAVENOUS

## 2013-02-05 MED ORDER — GLYCOPYRROLATE 0.2 MG/ML IJ SOLN
INTRAMUSCULAR | Status: AC
  Filled 2013-02-05: qty 3

## 2013-02-05 MED ORDER — METOPROLOL SUCCINATE ER 25 MG PO TB24
25.0000 mg | ORAL_TABLET | Freq: Every day | ORAL | Status: DC
Start: 2013-02-06 — End: 2013-02-10
  Administered 2013-02-06 – 2013-02-10 (×5): 25 mg via ORAL
  Filled 2013-02-05 (×6): qty 1

## 2013-02-05 MED ORDER — ACETAMINOPHEN 500 MG PO TABS
1000.0000 mg | ORAL_TABLET | Freq: Four times a day (QID) | ORAL | Status: AC
  Administered 2013-02-05 – 2013-02-06 (×3): 1000 mg via ORAL
  Filled 2013-02-05 (×3): qty 2

## 2013-02-05 MED ORDER — ALPRAZOLAM 0.25 MG PO TABS
0.2500 mg | ORAL_TABLET | Freq: Every day | ORAL | Status: DC
  Administered 2013-02-05 – 2013-02-09 (×5): 0.25 mg via ORAL
  Filled 2013-02-05 (×5): qty 1

## 2013-02-05 MED ORDER — PHENYLEPHRINE HCL 10 MG/ML IJ SOLN
10.0000 mg | INTRAVENOUS | Status: DC | PRN
  Administered 2013-02-05: 40 ug/min via INTRAVENOUS

## 2013-02-05 MED ORDER — FENTANYL CITRATE 0.05 MG/ML IJ SOLN
25.0000 ug | INTRAMUSCULAR | Status: DC | PRN
  Administered 2013-02-05: 25 ug via INTRAVENOUS
  Administered 2013-02-05: 50 ug via INTRAVENOUS
  Administered 2013-02-05: 25 ug via INTRAVENOUS
  Administered 2013-02-06 (×2): 50 ug via INTRAVENOUS
  Filled 2013-02-05 (×7): qty 2

## 2013-02-05 MED ORDER — LIDOCAINE HCL (CARDIAC) 20 MG/ML IV SOLN
INTRAVENOUS | Status: DC | PRN
  Administered 2013-02-05: 70 mg via INTRAVENOUS

## 2013-02-05 MED ORDER — DEXTROSE-NACL 5-0.9 % IV SOLN
INTRAVENOUS | Status: DC
  Administered 2013-02-05 – 2013-02-06 (×2): via INTRAVENOUS
  Administered 2013-02-06: 100 mL/h via INTRAVENOUS
  Administered 2013-02-06: 125 mL/h via INTRAVENOUS
  Administered 2013-02-07: 05:00:00 via INTRAVENOUS

## 2013-02-05 MED ORDER — ASPIRIN EC 81 MG PO TBEC
81.0000 mg | DELAYED_RELEASE_TABLET | Freq: Every day | ORAL | Status: DC
  Administered 2013-02-06 – 2013-02-10 (×5): 81 mg via ORAL
  Filled 2013-02-05 (×5): qty 1

## 2013-02-05 MED ORDER — LEVETIRACETAM 500 MG PO TABS
500.0000 mg | ORAL_TABLET | Freq: Two times a day (BID) | ORAL | Status: DC
  Administered 2013-02-05 – 2013-02-10 (×10): 500 mg via ORAL
  Filled 2013-02-05 (×12): qty 1

## 2013-02-05 SURGICAL SUPPLY — 70 items
ADH SKN CLS APL DERMABOND .7 (GAUZE/BANDAGES/DRESSINGS)
APPLIER CLIP ROT 10 11.4 M/L (STAPLE) ×3
APR CLP MED LRG 11.4X10 (STAPLE) ×1
BLADE STERNUM SYSTEM 6 (BLADE) ×2 IMPLANT
BLADE SURG 11 STRL SS (BLADE) IMPLANT
CANISTER SUCTION 2500CC (MISCELLANEOUS) ×3 IMPLANT
CATH THORACIC 28FR (CATHETERS) IMPLANT
CATH THORACIC 28FR RT ANG (CATHETERS) IMPLANT
CLIP APPLIE ROT 10 11.4 M/L (STAPLE) ×1 IMPLANT
CLIP TI MEDIUM 24 (CLIP) ×2 IMPLANT
CLIP TI WIDE RED SMALL 24 (CLIP) ×4 IMPLANT
CLOSURE WOUND 1/2 X4 (GAUZE/BANDAGES/DRESSINGS)
CONN ST 1/4X3/8  BEN (MISCELLANEOUS) ×2
CONN ST 1/4X3/8 BEN (MISCELLANEOUS) IMPLANT
CONN Y 3/8X3/8X3/8  BEN (MISCELLANEOUS) ×2
CONN Y 3/8X3/8X3/8 BEN (MISCELLANEOUS) IMPLANT
CONT SPEC 4OZ CLIKSEAL STRL BL (MISCELLANEOUS) ×7 IMPLANT
COVER SURGICAL LIGHT HANDLE (MISCELLANEOUS) ×6 IMPLANT
COVER TABLE BACK 60X90 (DRAPES) ×2 IMPLANT
DERMABOND ADVANCED (GAUZE/BANDAGES/DRESSINGS)
DERMABOND ADVANCED .7 DNX12 (GAUZE/BANDAGES/DRESSINGS) IMPLANT
DRAIN HEMOVAC 1/8 X 5 (WOUND CARE) IMPLANT
DRAPE LAPAROSCOPIC ABDOMINAL (DRAPES) ×3 IMPLANT
DRSG AQUACEL AG ADV 3.5X14 (GAUZE/BANDAGES/DRESSINGS) ×3 IMPLANT
ELECT BLADE 4.0 EZ CLEAN MEGAD (MISCELLANEOUS) ×3
ELECT REM PT RETURN 9FT ADLT (ELECTROSURGICAL) ×3
ELECTRODE BLDE 4.0 EZ CLN MEGD (MISCELLANEOUS) IMPLANT
ELECTRODE REM PT RTRN 9FT ADLT (ELECTROSURGICAL) ×1 IMPLANT
EVACUATOR SILICONE 100CC (DRAIN) IMPLANT
GEL ULTRASOUND 20GR AQUASONIC (MISCELLANEOUS) ×3 IMPLANT
GLOVE BIO SURGEON STRL SZ 6.5 (GLOVE) ×10 IMPLANT
GLOVE BIO SURGEON STRL SZ7 (GLOVE) ×8 IMPLANT
GLOVE BIO SURGEON STRL SZ7.5 (GLOVE) ×6 IMPLANT
GLOVE BIO SURGEONS STRL SZ 6.5 (GLOVE) ×10
GLOVE BIOGEL PI IND STRL 7.0 (GLOVE) IMPLANT
GLOVE BIOGEL PI INDICATOR 7.0 (GLOVE) ×8
GOWN STRL NON-REIN LRG LVL3 (GOWN DISPOSABLE) ×22 IMPLANT
HEMOSTAT POWDER SURGIFOAM 1G (HEMOSTASIS) IMPLANT
KIT BASIN OR (CUSTOM PROCEDURE TRAY) ×3 IMPLANT
KIT ROOM TURNOVER OR (KITS) ×3 IMPLANT
KIT SUCTION CATH 14FR (SUCTIONS) ×4 IMPLANT
NS IRRIG 1000ML POUR BTL (IV SOLUTION) ×6 IMPLANT
PACK CHEST (CUSTOM PROCEDURE TRAY) ×3 IMPLANT
PAD ARMBOARD 7.5X6 YLW CONV (MISCELLANEOUS) ×6 IMPLANT
SPONGE GAUZE 4X4 12PLY (GAUZE/BANDAGES/DRESSINGS) ×3 IMPLANT
STAPLER VISISTAT 35W (STAPLE) IMPLANT
STRIP CLOSURE SKIN 1/2X4 (GAUZE/BANDAGES/DRESSINGS) IMPLANT
SUT PROLENE 4 0 RB 1 (SUTURE)
SUT PROLENE 4-0 RB1 .5 CRCL 36 (SUTURE) IMPLANT
SUT SILK  1 MH (SUTURE) ×4
SUT SILK 0 FSL (SUTURE) ×6 IMPLANT
SUT SILK 1 MH (SUTURE) IMPLANT
SUT SILK 2 0 SH CR/8 (SUTURE) ×3 IMPLANT
SUT SILK 3 0 SH CR/8 (SUTURE) ×2 IMPLANT
SUT STEEL 6MS V (SUTURE) ×5 IMPLANT
SUT STEEL STERNAL CCS#1 18IN (SUTURE) ×2 IMPLANT
SUT STEEL SZ 6 DBL 3X14 BALL (SUTURE) ×2 IMPLANT
SUT VIC AB 1 CTX 18 (SUTURE) IMPLANT
SUT VIC AB 1 CTX 27 (SUTURE) ×4 IMPLANT
SUT VIC AB 2-0 CTX 36 (SUTURE) ×6 IMPLANT
SUT VIC AB 3-0 MH 27 (SUTURE) IMPLANT
SUT VIC AB 3-0 X1 27 (SUTURE) ×4 IMPLANT
SWABSTICK BENZOIN STERILE (MISCELLANEOUS) IMPLANT
SYSTEM SAHARA CHEST DRAIN RE-I (WOUND CARE) ×3 IMPLANT
TAPE CLOTH SURG 4X10 WHT LF (GAUZE/BANDAGES/DRESSINGS) ×2 IMPLANT
TAPE UMBILICAL COTTON 1/8X30 (MISCELLANEOUS) IMPLANT
TOWEL OR 17X24 6PK STRL BLUE (TOWEL DISPOSABLE) ×3 IMPLANT
TOWEL OR 17X26 10 PK STRL BLUE (TOWEL DISPOSABLE) ×6 IMPLANT
TRAY FOLEY IC TEMP SENS 14FR (CATHETERS) ×3 IMPLANT
WATER STERILE IRR 1000ML POUR (IV SOLUTION) ×3 IMPLANT

## 2013-02-05 NOTE — Transfer of Care (Signed)
Immediate Anesthesia Transfer of Care Note  Patient: [name]  Procedure(s) Performed: Procedure(s): MEDIAN STERNOTOMY (N/A) RESECTION OF MEDIASTINAL MASS (N/A)  Patient Location: PACU and SICU  Anesthesia Type:General  Level of Consciousness: sedated  Airway & Oxygen Therapy: Patient remains intubated per anesthesia plan and Patient placed on Ventilator (see vital sign flow sheet for setting)  Post-op Assessment: Report given to PACU RN and Post -op Vital signs reviewed and stable  Post vital signs: Reviewed and stable  Complications: No apparent anesthesia complications

## 2013-02-05 NOTE — Procedures (Signed)
**Note De-Identified Alexxis Mackert Obfuscation** Extubation Procedure Note  Patient Details:   Name: [name] DOB: 11-25-1945 MRN: [id number]   Airway Documentation:  Airway 7 mm (Active)  Secured at (cm) 24 cm 02/05/2013 10:55 AM  Measured From Lips 02/05/2013 10:55 AM  Secured Location Right 02/05/2013 10:55 AM  Secured By Rana Snare Tape 02/05/2013 10:55 AM    Evaluation  O2 sats: stable throughout Complications: No apparent complications Patient did tolerate procedure well. Bilateral Breath Sounds: Clear;Diminished   Yes + leak, no stridor noted, FVC.528ml NIF -40 Sukhman Martine, Penni Bombard 02/05/2013, 4:36 PM

## 2013-02-05 NOTE — Brief Op Note (Addendum)
02/05/2013  10:07 AM  PATIENT:  [name]  68 y.o. female  PRE-OPERATIVE DIAGNOSIS:  MEDIASTINAL MASS  POST-OPERATIVE DIAGNOSIS:  MEDIASTINAL MASS  PROCEDURE: MEDIAN STERNOTOMY, RESECTION OF MEDIASTINAL MASS   SURGEON:  Surgeon(s) and Role:    * Ivin Poot, MD - Primary  PHYSICIAN ASSISTANT: Lars Pinks PA-C  ANESTHESIA:   general  EBL:  Total I/O In: 1500 [I.V.:1500] Out: 250 [Urine:150; Blood:100]  BLOOD ADMINISTERED:none  DRAINS: Chest tube placed in the right pleural space and bard drain in the mediastinal space  SPECIMEN:  Pericardial cyst, thymic fat  DISPOSITION OF SPECIMEN:  PATHOLOGY  COUNTS CORRECT:  YES  DICTATION: .Dragon Dictation  PLAN OF CARE: Admit to inpatient   PATIENT DISPOSITION:  PACU - hemodynamically stable.   Delay start of Pharmacological VTE agent (>24hrs) due to surgical blood loss or risk of bleeding: yes

## 2013-02-05 NOTE — Anesthesia Postprocedure Evaluation (Signed)
  Anesthesia Post-op Note  Patient: [name]  Procedure(s) Performed: Procedure(s): MEDIAN STERNOTOMY (N/A) RESECTION OF MEDIASTINAL MASS (N/A)  Patient Location: ICU  Anesthesia Type:General  Level of Consciousness: sedated  Airway and Oxygen Therapy: Patient remains intubated per anesthesia plan  Post-op Pain: none  Post-op Assessment: Post-op Vital signs reviewed, Patient's Cardiovascular Status Stable, Respiratory Function Stable, Patent Airway, No signs of Nausea or vomiting and Pain level controlled  Post-op Vital Signs: Reviewed and stable  Complications: No apparent anesthesia complications

## 2013-02-05 NOTE — Telephone Encounter (Signed)
Initiated patient assistance forms to [place]

## 2013-02-05 NOTE — Anesthesia Preprocedure Evaluation (Addendum)
Anesthesia Evaluation  Patient identified by MRN, date of birth, ID band Patient awake    Reviewed: Allergy & Precautions, H&P , NPO status , Patient's Chart, lab work & pertinent test results, reviewed documented beta blocker date and time   History of Anesthesia Complications (+) PONV  Airway Mallampati: II TM Distance: >3 FB     Dental  (+) Teeth Intact and Dental Advisory Given   Pulmonary shortness of breath, former smoker,  breath sounds clear to auscultation        Cardiovascular hypertension, Pt. on medications and Pt. on home beta blockers + angina + CAD, + Past MI and + Cardiac Stents + Valvular Problems/Murmurs Rhythm:Regular Rate:Normal     Neuro/Psych Seizures -, Well Controlled,  Anxiety Depression    GI/Hepatic hiatal hernia, GERD-  ,  Endo/Other  Hypothyroidism   Renal/GU      Musculoskeletal   Abdominal (+) + obese,   Peds  Hematology  (+) anemia ,   Anesthesia Other Findings   Reproductive/Obstetrics                          Anesthesia Physical Anesthesia Plan  ASA: III  Anesthesia Plan: General   Post-op Pain Management:    Induction: Intravenous  Airway Management Planned: Oral ETT  Additional Equipment: Arterial line and CVP  Intra-op Plan:   Post-operative Plan: Extubation in OR and Possible Post-op intubation/ventilation  Informed Consent: I have reviewed the patients History and Physical, chart, labs and discussed the procedure including the risks, benefits and alternatives for the proposed anesthesia with the patient or authorized representative who has indicated his/her understanding and acceptance.   Dental advisory given  Plan Discussed with: Surgeon and CRNA  Anesthesia Plan Comments:        Anesthesia Quick Evaluation

## 2013-02-05 NOTE — Progress Notes (Signed)
The patient was examined and preop studies reviewed. There has been no change from the prior exam and the patient is ready for surgery. Plan resection of mediastinal mass on [name] today

## 2013-02-05 NOTE — Plan of Care (Signed)
Problem: Phase I Progression Outcomes Goal: Tubes/drains patent Outcome: Completed/Met Date Met:  02/05/13 Chest tubes and foley catheter

## 2013-02-05 NOTE — Preoperative (Signed)
Beta Blockers   Reason not to administer Beta Blockers:Not Applicable 

## 2013-02-05 NOTE — Progress Notes (Signed)
Patient ID: [name], female   DOB: [birth year], 68 y.o.   MRN: [id number] EVENING ROUNDS NOTE :     [address]       [place] [address]             [phone]                 Day of Surgery Procedure(s) (LRB): MEDIAN STERNOTOMY (N/A) RESECTION OF MEDIASTINAL MASS (N/A)  Total Length of Stay:  LOS: 0 days  BP 96/65  Pulse 62  Temp(Src) 97.4 F (36.3 C) (Oral)  Resp 14  Ht 4\' 11"  (1.499 m)  Wt 151 lb 6.4 oz (68.675 kg)  BMI 30.56 kg/m2  SpO2 98%  .Intake/Output     01/25 0701 - 01/26 0700 01/26 0701 - 01/27 0700   I.V. (mL/kg)  2034.1 (29.6)   Total Intake(mL/kg)  2034.1 (29.6)   Urine (mL/kg/hr)  310 (0.4)   Blood  100 (0.1)   Chest Tube  105 (0.1)   Total Output   515   Net   +1519.1          . dextrose 5 % and 0.9% NaCl 100 mL/hr at 02/05/13 1108     Lab Results  Component Value Date   WBC 6.4 02/01/2013   HGB 11.5* 02/01/2013   HCT 33.9* 02/01/2013   PLT 222 02/01/2013   GLUCOSE 108* 02/01/2013   CHOL 149 10/24/2012   TRIG 41 10/24/2012   HDL 79 10/24/2012   LDLCALC 62 10/24/2012   ALT 9 02/01/2013   AST 15 02/01/2013   NA 142 02/01/2013   K 4.3 02/01/2013   CL 106 02/01/2013   CREATININE 1.19* 02/01/2013   BUN 21 02/01/2013   CO2 22 02/01/2013   TSH 5.349* 10/24/2012   INR 0.99 02/01/2013   HGBA1C 5.8* 10/24/2012   Now extubated, neuro intact uop 10ml hr, fluids increased BP stable  Grace Isaac MD  Beeper [phone] Office [phone] 02/05/2013 6:18 PM

## 2013-02-05 NOTE — Progress Notes (Signed)
Continues to have poor respiratory effort and decreased respiration on CPAP/PS and Precedex at 0.2 mcg/kg/hr. Stopped precedex and placed back on rest mode. Will attempt to wean again after she rest.

## 2013-02-05 NOTE — Progress Notes (Signed)
Dr. Servando Snare notified of continued decreasing urine output. Orders received for 250 cc 5% Albumin, a CBC and a BMP. Will continue to monitor pt.

## 2013-02-05 NOTE — Progress Notes (Signed)
1630-Notified Dr. Darcey Nora of patient's pre-extubation ABG results and weaning parameters. Order received to extubate. Also informed Urine output decreased to 15/hr x last 2 hours. Order received to increase IV fluids to 125/hr. At [place], Lars Pinks, PA by to see paitent. Informed of continued decreased urine output and 1 hour post-extubation ABG results. Dr. Servando Snare also informed of urine output on rounds around 1800.

## 2013-02-06 ENCOUNTER — Inpatient Hospital Stay (HOSPITAL_COMMUNITY): Payer: Medicare Other

## 2013-02-06 ENCOUNTER — Encounter (HOSPITAL_COMMUNITY): Payer: Self-pay | Admitting: Cardiothoracic Surgery

## 2013-02-06 ENCOUNTER — Other Ambulatory Visit: Payer: Self-pay

## 2013-02-06 LAB — CBC
HCT: 29 % — ABNORMAL LOW (ref 36.0–46.0)
HEMOGLOBIN: 9.8 g/dL — AB (ref 12.0–15.0)
MCH: 29.8 pg (ref 26.0–34.0)
MCHC: 33.8 g/dL (ref 30.0–36.0)
MCV: 88.1 fL (ref 78.0–100.0)
PLATELETS: 175 10*3/uL (ref 150–400)
RBC: 3.29 MIL/uL — ABNORMAL LOW (ref 3.87–5.11)
RDW: 12.9 % (ref 11.5–15.5)
WBC: 13 10*3/uL — ABNORMAL HIGH (ref 4.0–10.5)

## 2013-02-06 LAB — BLOOD GAS, ARTERIAL
Acid-base deficit: 4.2 mmol/L — ABNORMAL HIGH (ref 0.0–2.0)
Bicarbonate: 21.5 mEq/L (ref 20.0–24.0)
Drawn by: 252031
O2 Content: 2 L/min
O2 Saturation: 97.2 %
Patient temperature: 98.6
TCO2: 23 mmol/L (ref 0–100)
pCO2 arterial: 47.5 mmHg — ABNORMAL HIGH (ref 35.0–45.0)
pH, Arterial: 7.278 — ABNORMAL LOW (ref 7.350–7.450)
pO2, Arterial: 99.2 mmHg (ref 80.0–100.0)

## 2013-02-06 LAB — BASIC METABOLIC PANEL
BUN: 23 mg/dL (ref 6–23)
CO2: 20 mEq/L (ref 19–32)
Calcium: 8.2 mg/dL — ABNORMAL LOW (ref 8.4–10.5)
Chloride: 109 mEq/L (ref 96–112)
Creatinine, Ser: 1.27 mg/dL — ABNORMAL HIGH (ref 0.50–1.10)
GFR, EST AFRICAN AMERICAN: 50 mL/min — AB (ref >90)
GFR, EST NON AFRICAN AMERICAN: 43 mL/min — AB (ref >90)
GLUCOSE: 159 mg/dL — AB (ref 70–99)
POTASSIUM: 4 meq/L (ref 3.7–5.3)
Sodium: 143 mEq/L (ref 137–147)

## 2013-02-06 MED ORDER — FUROSEMIDE 10 MG/ML IJ SOLN
20.0000 mg | Freq: Once | INTRAMUSCULAR | Status: AC
  Administered 2013-02-06: 20 mg via INTRAVENOUS
  Filled 2013-02-06: qty 2

## 2013-02-06 MED ORDER — DILTIAZEM HCL ER 120 MG PO CP24
120.0000 mg | ORAL_CAPSULE | Freq: Every day | ORAL | Status: DC
  Administered 2013-02-06 – 2013-02-10 (×5): 120 mg via ORAL
  Filled 2013-02-06 (×6): qty 1

## 2013-02-06 NOTE — Progress Notes (Addendum)
[facility] DAILY ICU PROGRESS NOTE                   [address]            [place] [address]          [phone]   1 Day Post-Op Procedure(s) (LRB): MEDIAN STERNOTOMY (N/A) RESECTION OF MEDIASTINAL MASS (N/A)  Total Length of Stay:  LOS: 1 day   Subjective: Patient sitting in chair. She states that she did not sleep well.  Objective: Vital signs in last 24 hours: Temp:  [97.4 F (36.3 C)-98.9 F (37.2 C)] 98.9 F (37.2 C) (01/27 0729) Pulse Rate:  [50-84] 80 (01/27 0700) Cardiac Rhythm:  [-] Normal sinus rhythm (01/27 0700) Resp:  [9-24] 12 (01/27 0700) BP: (80-167)/(35-91) 122/55 mmHg (01/27 0700) SpO2:  [90 %-100 %] 96 % (01/27 0700) Arterial Line BP: (80-178)/(56-119) 125/60 mmHg (01/27 0600) FiO2 (%):  [40 %] 40 % (01/26 1521) Weight:  [68.675 kg (151 lb 6.4 oz)-74.1 kg (163 lb 5.8 oz)] 74.1 kg (163 lb 5.8 oz) (01/27 0400)  Filed Weights   02/05/13 1116 02/06/13 0400  Weight: 68.675 kg (151 lb 6.4 oz) 74.1 kg (163 lb 5.8 oz)      Intake/Output from previous day: 01/26 0701 - 01/27 0700 In: 4210.3 [I.V.:3890.3; IV Piggyback:300] Out: 885 [Urine:460; Blood:100; Chest Tube:325]  Intake/Output this shift:    Current Meds: Scheduled Meds: . ALPRAZolam  0.25 mg Oral QHS  . aspirin EC  81 mg Oral Daily  . atorvastatin  20 mg Oral q1800  . bisacodyl  10 mg Oral Daily  . calcitRIOL  0.5 mcg Oral Daily  . cefUROXime (ZINACEF)  IV  1.5 g Intravenous Q12H  . diltiazem  240 mg Oral Daily  . docusate sodium  100 mg Oral QHS  . doxazosin  2 mg Oral Daily  . levETIRAcetam  500 mg Oral BID  . levothyroxine  100 mcg Oral QAC breakfast  . metoprolol succinate  25 mg Oral Daily  . pantoprazole  40 mg Oral BID  . PARoxetine  30 mg Oral Daily  . sodium chloride  10-40 mL Intracatheter Q12H   Continuous Infusions: . dextrose 5 % and 0.9% NaCl 125 mL/hr at 02/06/13 0700   PRN Meds:.fentaNYL, ondansetron (ZOFRAN) IV, oxyCODONE, oxyCODONE-acetaminophen,  potassium chloride, sodium chloride, traMADol  General appearance: alert, cooperative and no distress Neurologic: intact Heart: RRR Lungs: Diminshed at bases;no rales, wheezes, or rhonchi Abdomen: Soft, non tender, bowel sounds present Extremities: Trace LE edema Wound: Dressing intact  Lab Results: CBC: Recent Labs  02/05/13 2228 02/06/13 0400  WBC 13.8* 13.0*  HGB 10.0* 9.8*  HCT 29.6* 29.0*  PLT 178 175   BMET:  Recent Labs  02/05/13 2228 02/06/13 0400  NA 142 143  K 4.2 4.0  CL 107 109  CO2 22 20  GLUCOSE 178* 159*  BUN 22 23  CREATININE 1.15* 1.27*  CALCIUM 8.4 8.2*    PT/INR: No results found for this basename: LABPROT, INR,  in the last 72 hours Radiology: Dg Chest 2 View  02/05/2013   CLINICAL DATA:  Preoperative exam for mediastinal mass.  EXAM: CHEST  2 VIEW  COMPARISON:  Portable chest x-ray of April 01, 2012  FINDINGS: The right hemidiaphragm is higher than the left. This is not new but is more conspicuous today. The right lung is clear. There is an ovoid partially image rounded density projecting over the right hilum consistent with the  known right-sided mediastinal mass. On the left there is stable linear density lateral to the left heart border consistent with scarring. The cardiac silhouette is top-normal in size. The pulmonary vascularity is not engorged. The mediastinum is normal in width. There is mild tortuosity of the descending thoracic aorta. There is no pleural effusion or pneumothorax. Mild degenerative disc space narrowing of the thoracic spine is demonstrated at multiple levels.  IMPRESSION: There is no evidence of pneumonia nor CHF. A the right-sided mediastinal mass is partially imaged on the frontal film. .   Electronically Signed   By: David  Martinique   On: 02/05/2013 07:30   Dg Chest Port 1 View  02/05/2013   CLINICAL DATA:  Chest tube placement.  Status post sternotomy.  EXAM: PORTABLE CHEST - 1 VIEW  COMPARISON:  02/05/2013  FINDINGS:  Endotracheal tube tip is above the carina. There is a right IJ catheter with tip in the cavoatrial junction. Right chest tube is in place without significant pneumothorax. Heart size is mildly enlarged. There is low lung volumes with bibasilar atelectasis. Pulmonary vascular congestion is noted.  IMPRESSION: 1. Right chest tube in place without pneumothorax. 2. Bibasilar atelectasis and pulmonary vascular congestion.   Electronically Signed   By: Kerby Moors M.D.   On: 02/05/2013 12:39     Assessment/Plan: S/P Procedure(s) (LRB): MEDIAN STERNOTOMY (N/A) RESECTION OF MEDIASTINAL MASS (N/A)  1.CV-SR. On Cardizem 240 daily, Cardura 2 daily, and Toprol XL 25. Will decrease Cardizem to 120 to make sure she tolerates it. Will discuss when to restart Plavix with Dr. Prescott Gum. 2. Pulmonary-Chest tube with 325 cc of output since surgery. Possibly remove mediastinal and place pleural to water seal. CXR appears to show low lung volumes, no pneumothorax, atelectasis at bases. 3. Creatinine increased from 1.15 to 1.27.Low UO yesterday. IVF increased to 125 cc/hr and given Albumin . IVF to remain at current rate.  Foley to remain for close I/O today. Will check UO in afternoon to see if able to decrease fluid rate.4.ABL anemia-H and H 9.8 and 29 5. Xanax for sleep at night 6.GI-advance diet as tolerates to heart healthy  ZIMMERMAN,DONIELLE M PA-C 02/06/2013 7:57 AM  150 from ct past 12 hours Try mild diuresis today D/c mt tube d/c pleural tube in am Leave foley to monitor uop I have seen and examined [name] and agree with the above assessment  and plan.  Grace Isaac MD Beeper [phone] Office [phone] 02/06/2013 8:58 AM

## 2013-02-06 NOTE — Progress Notes (Signed)
[facility] BRIEF SICU PROGRESS NOTE  1 Day Post-Op  S/P Procedure(s) (LRB): MEDIAN STERNOTOMY (N/A) RESECTION OF MEDIASTINAL MASS (N/A)   Stable day NSR w/ stable BP  Plan: Continue current plan  OWEN,CLARENCE H 02/06/2013 8:52 PM

## 2013-02-07 ENCOUNTER — Inpatient Hospital Stay (HOSPITAL_COMMUNITY): Payer: Medicare Other

## 2013-02-07 LAB — COMPREHENSIVE METABOLIC PANEL
ALT: 10 U/L (ref 0–35)
AST: 21 U/L (ref 0–37)
Albumin: 2.7 g/dL — ABNORMAL LOW (ref 3.5–5.2)
Alkaline Phosphatase: 51 U/L (ref 39–117)
BILIRUBIN TOTAL: 0.4 mg/dL (ref 0.3–1.2)
BUN: 22 mg/dL (ref 6–23)
CHLORIDE: 109 meq/L (ref 96–112)
CO2: 20 mEq/L (ref 19–32)
Calcium: 7.6 mg/dL — ABNORMAL LOW (ref 8.4–10.5)
Creatinine, Ser: 1.4 mg/dL — ABNORMAL HIGH (ref 0.50–1.10)
GFR calc Af Amer: 44 mL/min — ABNORMAL LOW (ref >90)
GFR calc non Af Amer: 38 mL/min — ABNORMAL LOW (ref >90)
GLUCOSE: 119 mg/dL — AB (ref 70–99)
Potassium: 3.7 mEq/L (ref 3.7–5.3)
SODIUM: 143 meq/L (ref 137–147)
TOTAL PROTEIN: 5.9 g/dL — AB (ref 6.0–8.3)

## 2013-02-07 LAB — CBC
HCT: 27.7 % — ABNORMAL LOW (ref 36.0–46.0)
HEMOGLOBIN: 9.2 g/dL — AB (ref 12.0–15.0)
MCH: 29.6 pg (ref 26.0–34.0)
MCHC: 33.2 g/dL (ref 30.0–36.0)
MCV: 89.1 fL (ref 78.0–100.0)
Platelets: 153 10*3/uL (ref 150–400)
RBC: 3.11 MIL/uL — AB (ref 3.87–5.11)
RDW: 13.3 % (ref 11.5–15.5)
WBC: 12.6 10*3/uL — AB (ref 4.0–10.5)

## 2013-02-07 MED ORDER — POTASSIUM CHLORIDE 10 MEQ/50ML IV SOLN
10.0000 meq | INTRAVENOUS | Status: AC
  Administered 2013-02-07 (×2): 10 meq via INTRAVENOUS
  Filled 2013-02-07 (×2): qty 50

## 2013-02-07 MED ORDER — FUROSEMIDE 10 MG/ML IJ SOLN
40.0000 mg | Freq: Once | INTRAMUSCULAR | Status: AC
  Administered 2013-02-07: 40 mg via INTRAVENOUS
  Filled 2013-02-07: qty 4

## 2013-02-07 MED ORDER — FUROSEMIDE 10 MG/ML IJ SOLN
20.0000 mg | Freq: Once | INTRAMUSCULAR | Status: AC
  Administered 2013-02-07: 20 mg via INTRAVENOUS
  Filled 2013-02-07: qty 2

## 2013-02-07 MED ORDER — LEVALBUTEROL HCL 1.25 MG/0.5ML IN NEBU
1.2500 mg | INHALATION_SOLUTION | Freq: Four times a day (QID) | RESPIRATORY_TRACT | Status: DC
  Administered 2013-02-07 – 2013-02-10 (×9): 1.25 mg via RESPIRATORY_TRACT
  Filled 2013-02-07 (×15): qty 0.5

## 2013-02-07 MED ORDER — CLOPIDOGREL BISULFATE 75 MG PO TABS
75.0000 mg | ORAL_TABLET | Freq: Every day | ORAL | Status: DC
  Administered 2013-02-08 – 2013-02-10 (×3): 75 mg via ORAL
  Filled 2013-02-07 (×5): qty 1

## 2013-02-07 MED ORDER — METOCLOPRAMIDE HCL 5 MG/ML IJ SOLN
10.0000 mg | Freq: Once | INTRAMUSCULAR | Status: AC
  Administered 2013-02-07: 10 mg via INTRAVENOUS
  Filled 2013-02-07: qty 2

## 2013-02-07 NOTE — Addendum Note (Signed)
Addendum created 02/07/13 1505 by Laurie Panda, MD   Modules edited: Anesthesia Responsible Staff

## 2013-02-07 NOTE — Progress Notes (Signed)
2 Days Post-Op Procedure(s) (LRB): MEDIAN STERNOTOMY (N/A) RESECTION OF MEDIASTINAL MASS (N/A) Subjective: Ready to move to stepdown NSR Path pending of mediastinal cyst 6 cm  Objective: Vital signs in last 24 hours: Temp:  [97.6 F (36.4 C)-98.5 F (36.9 C)] 98.3 F (36.8 C) (01/28 0400) Pulse Rate:  [76-92] 90 (01/28 0800) Cardiac Rhythm:  [-] Normal sinus rhythm (01/28 0715) Resp:  [10-31] 20 (01/28 0800) BP: (92-133)/(49-87) 133/58 mmHg (01/28 0800) SpO2:  [90 %-100 %] 100 % (01/28 0800)  Hemodynamic parameters for last 24 hours:  nsr afebrile  Intake/Output from previous day: 01/27 0701 - 01/28 0700 In: 2805 [P.O.:210; I.V.:2545; IV Piggyback:50] Out: 546 [Urine:585; Chest Tube:250] Intake/Output this shift: Total I/O In: 100 [I.V.:100] Out: 30 [Urine:30]  EXAM Slight confusion No air leak or drainage  Lab Results:  Recent Labs  02/06/13 0400 02/07/13 0425  WBC 13.0* 12.6*  HGB 9.8* 9.2*  HCT 29.0* 27.7*  PLT 175 153   BMET:  Recent Labs  02/06/13 0400 02/07/13 0425  NA 143 143  K 4.0 3.7  CL 109 109  CO2 20 20  GLUCOSE 159* 119*  BUN 23 22  CREATININE 1.27* 1.40*  CALCIUM 8.2* 7.6*    PT/INR: No results found for this basename: LABPROT, INR,  in the last 72 hours ABG    Component Value Date/Time   PHART 7.278* 02/06/2013 0351   HCO3 21.5 02/06/2013 0351   TCO2 23.0 02/06/2013 0351   ACIDBASEDEF 4.2* 02/06/2013 0351   O2SAT 97.2 02/06/2013 0351   CBG (last 3)  No results found for this basename: GLUCAP,  in the last 72 hours  Assessment/Plan: S/P Procedure(s) (LRB): MEDIAN STERNOTOMY (N/A) RESECTION OF MEDIASTINAL MASS (N/A)  Transfer to 2000 tele bed PT Resume plavix for DES 2014   LOS: 2 days    VAN TRIGT III,PETER 02/07/2013

## 2013-02-07 NOTE — Evaluation (Signed)
Physical Therapy Evaluation Patient Details Name: [name] MRN: [id number] DOB: 1945-03-03 Today's Date: 02/07/2013 Time: 7829-5621 PT Time Calculation (min): 28 min  PT Assessment / Plan / Recommendation History of Present Illness  pt admitted for resection of a mediastinal mass via sternotomy  Clinical Impression  Pt admitted with mediastinal mass s/p resection. Pt currently with functional limitations due to the deficits listed below (see PT Problem List).  Pt will benefit from skilled PT to increase their independence and safety with mobility to allow discharge to home with husband's available assist.      PT Assessment  Patient needs continued PT services    Follow Up Recommendations  Home health PT;Supervision for mobility/OOB    Does the patient have the potential to tolerate intense rehabilitation      Barriers to Discharge        Equipment Recommendations   (RW and 3 in 1 TBA)    Recommendations for Other Services     Frequency Min 3X/week    Precautions / Restrictions Precautions Precautions: Sternal   Pertinent Vitals/Pain Vitals stable and pt did show dyspnea with ambulation.      Mobility  Bed Mobility Overal bed mobility: Needs Assistance Bed Mobility: Supine to Sit Supine to sit: Min assist General bed mobility comments: reinforced sternal precautions Transfers Overall transfer level: Needs assistance Transfers: Sit to/from Stand Sit to Stand: Min assist General transfer comment: reinforced sternal precautions.  helped pt come forward Ambulation/Gait Ambulation/Gait assistance: Min guard;Min assist Ambulation Distance (Feet): 140 Feet Assistive device: 1 person hand held assist;None Gait Pattern/deviations: Step-through pattern;Wide base of support General Gait Details: Mildly unsteady overall, but able to balance by using a slightly wider BOS    Exercises     PT Diagnosis: Generalized weakness;Abnormality of gait  PT Problem List:  Decreased strength;Decreased activity tolerance;Decreased mobility;Decreased knowledge of use of DME;Decreased knowledge of precautions;Pain PT Treatment Interventions: DME instruction;Gait training;Stair training;Functional mobility training;Therapeutic activities;Patient/family education     PT Goals(Current goals can be found in the care plan section) Acute Rehab PT Goals Patient Stated Goal: Be independent at home PT Goal Formulation: With patient Time For Goal Achievement: 02/14/13 Potential to Achieve Goals: Good  Visit Information  Last PT Received On: 02/07/13 Assistance Needed: +1 History of Present Illness: pt admitted for resection of a mediastinal mass via sternotomy       Prior [place] expects to be discharged to:: Private residence Living Arrangements: Spouse/significant other Available Help at Discharge: Family Type of Home: House Home Access: Stairs to enter Technical brewer of Steps: 6 Entrance Stairs-Rails: Right;Left Home Layout: One level Home Equipment: None Prior Function Level of Independence: Independent Communication Communication: No difficulties    Cognition  Cognition Arousal/Alertness: Lethargic Behavior During Therapy: WFL for tasks assessed/performed Overall Cognitive Status: Within Functional Limits for tasks assessed    Extremity/Trunk Assessment Upper Extremity Assessment Upper Extremity Assessment: Defer to OT evaluation Lower Extremity Assessment Lower Extremity Assessment: Generalized weakness;Overall [facility] for tasks assessed   Balance Balance Overall balance assessment: No apparent balance deficits (not formally assessed)  End of Session PT - End of Session Equipment Utilized During Treatment: Oxygen Activity Tolerance: Patient tolerated treatment well Patient left: in chair;with call bell/phone within reach;with family/visitor present Nurse Communication: Mobility status  GP     Devany Aja,  Tessie Fass 02/07/2013, 4:08 PM 02/07/2013  Donnella Sham, [place] [phone]  (pager)

## 2013-02-07 NOTE — Significant Event (Signed)
1135-patient is transferred to 2W, room 03, traveled via wheelchair, on 1L [place]. VS stable prior and during the transfer. No personal belongings at bedside of 2S that could be taken up to her new room. Patient's spouse at bedside. Report given to [place], Therapist, sports. Patient is settled in bed, NT is in the room with patient. Marcus Groll, Therapist, sports.

## 2013-02-08 ENCOUNTER — Inpatient Hospital Stay (HOSPITAL_COMMUNITY): Payer: Medicare Other

## 2013-02-08 LAB — BASIC METABOLIC PANEL
BUN: 21 mg/dL (ref 6–23)
CO2: 22 mEq/L (ref 19–32)
Calcium: 7.9 mg/dL — ABNORMAL LOW (ref 8.4–10.5)
Chloride: 108 mEq/L (ref 96–112)
Creatinine, Ser: 1.29 mg/dL — ABNORMAL HIGH (ref 0.50–1.10)
GFR calc Af Amer: 49 mL/min — ABNORMAL LOW (ref >90)
GFR calc non Af Amer: 42 mL/min — ABNORMAL LOW (ref >90)
Glucose, Bld: 104 mg/dL — ABNORMAL HIGH (ref 70–99)
Potassium: 3.2 mEq/L — ABNORMAL LOW (ref 3.7–5.3)
Sodium: 143 mEq/L (ref 137–147)

## 2013-02-08 LAB — CBC
HCT: 27.4 % — ABNORMAL LOW (ref 36.0–46.0)
Hemoglobin: 9 g/dL — ABNORMAL LOW (ref 12.0–15.0)
MCH: 29.3 pg (ref 26.0–34.0)
MCHC: 32.8 g/dL (ref 30.0–36.0)
MCV: 89.3 fL (ref 78.0–100.0)
Platelets: 171 10*3/uL (ref 150–400)
RBC: 3.07 MIL/uL — ABNORMAL LOW (ref 3.87–5.11)
RDW: 13.5 % (ref 11.5–15.5)
WBC: 8.2 10*3/uL (ref 4.0–10.5)

## 2013-02-08 MED ORDER — POTASSIUM CHLORIDE CRYS ER 20 MEQ PO TBCR
40.0000 meq | EXTENDED_RELEASE_TABLET | Freq: Once | ORAL | Status: AC
  Administered 2013-02-08: 40 meq via ORAL
  Filled 2013-02-08: qty 2

## 2013-02-08 MED ORDER — LEVALBUTEROL HCL 0.63 MG/3ML IN NEBU
0.6300 mg | INHALATION_SOLUTION | Freq: Four times a day (QID) | RESPIRATORY_TRACT | Status: DC | PRN
  Administered 2013-02-08: 0.63 mg via RESPIRATORY_TRACT
  Filled 2013-02-08: qty 3

## 2013-02-08 MED ORDER — GUAIFENESIN 100 MG/5ML PO SOLN
5.0000 mL | ORAL | Status: DC | PRN
  Filled 2013-02-08: qty 5

## 2013-02-08 NOTE — Op Note (Signed)
NAMECHRISTIANA, [name]                ACCOUNT NO.:  [id number]  MEDICAL RECORD NO.:  [id number]  LOCATION:  2W03C                        FACILITY:  [facility]  PHYSICIAN:  Ivin Poot, M.D.  DATE OF BIRTH:  Aug 29, 1945  DATE OF PROCEDURE:  02/05/2013 DATE OF DISCHARGE:                              OPERATIVE REPORT   OPERATION:  Median sternotomy and excision of anterior mediastinal pericardial cyst.  PREOPERATIVE DIAGNOSIS:  A 6-cm anterior mediastinal pericardial cyst with atypical chest pain.  POSTOPERATIVE DIAGNOSIS:  A 6-cm anterior mediastinal pericardial cyst with atypical chest pain.  SURGEON:  Ivin Poot, M.D.  ASSISTANT:  Lars Pinks, PA-C.  ANESTHESIA:  General.  INDICATIONS:  The patient is 68 year old and has been followed in the office with scans including a PET scan of a large anterior mediastinal cystic structure, felt to be possibly pericardial cyst.  She has had associated atypical chest pains.  This has been significant and the patient has previously undergone PCI with stenting of a circumflex lesion with a drug-eluting stent in April 2014.  Repeat cardiac catheterizations at showed significant CAD.  Because of the large size of the cyst, it was felt that surgical extirpation would be indicated, and I discussed the procedure in detail with the patient and her family over the course of several office visits.  We also received clearance for stopping Plavix load preoperatively by the patient's cardiologist, Dr. Radford Pax.  I had previously discussed with the patient the risks of the procedure including the risks of wound infection, bleeding, blood transfusion requirement, and death.  She understood and agreed to proceed with surgery under informed consent.  OPERATIVE FINDINGS:  There is a large pericardial cyst attached to the parietal pericardium just to the right of the ascending aorta and extending inferiorly towards the right hilum.  Care was  taken to avoid surgical trauma to the right phrenic nerve.  Frozen section of the structure indicated that it was benign.  OPERATIVE PROCEDURE:  The patient was brought to the operating room, placed supine on the operating table where general anesthesia was induced under invasive hemodynamic monitoring.  The chest and abdomen were prepped and draped as a sterile field.  A proper time-out was performed.  A sternal incision was made.  The sternum was divided and the anterior mediastinum was dissected.  There was a large amount of thymic fat, which were carefully dissected and excised.  Dissection along the right medial pericardium identified a cystic structure that was dissected from the pericardium except where it directly communicated just to the right of the ascending aorta at the proximal sinotubular junction.  The cystic structure was dissected off the parietal pericardium down to the level of the right phrenic nerve, which was preserved and protected.  The structure removed was approximately 6 x 3.5 cm.  A defect in the anterior parietal pericardium was closed with interrupted silk sutures.  The anterior mediastinal fat was carefully examined and hemostasis obtained.  At this point, the right pleural space was entered, a right pleural tube was placed as well as an anterior mediastinal tube, both brought out through separate incisions.  The sternal retractor was removed.  The sternum  was closed with interrupted steel wire.  The pectoralis fascia was closed in running #1 Vicryl.  The subcutaneous and skin layers were closed in running Vicryl and sterile dressings were applied.  The patient was left intubated and returned to the ICU in a stable condition.     Ivin Poot, M.D.     PV/MEDQ  D:  02/07/2013  T:  02/08/2013  Job:  [id number]

## 2013-02-08 NOTE — Progress Notes (Addendum)
[address]       [place] [address]             [phone]      3 Days Post-Op  Procedure(s) (LRB): MEDIAN STERNOTOMY (N/A) RESECTION OF MEDIASTINAL MASS (N/A) Subjective: Feels weak and sore, + cough  Objective  Telemetry sinus rhythm  Temp:  [97.9 F (36.6 C)-98.7 F (37.1 C)] 97.9 F (36.6 C) (01/29 0400) Pulse Rate:  [77-80] 80 (01/29 0400) Resp:  [18-20] 20 (01/29 0400) BP: (123-133)/(66-81) 128/66 mmHg (01/29 0400) SpO2:  [95 %-98 %] 97 % (01/29 0417)   Intake/Output Summary (Last 24 hours) at 02/08/13 0916 Last data filed at 02/08/13 0700  Gross per 24 hour  Intake 626.67 ml  Output   2035 ml  Net -1408.33 ml       General appearance: alert, cooperative and no distress Heart: regular rate and rhythm Lungs: wheezes bilaterally Abdomen: soft, nontender Extremities: trace edema Wound: dressings CDI  Lab Results:  Recent Labs  02/07/13 0425 02/08/13 0614  NA 143 143  K 3.7 3.2*  CL 109 108  CO2 20 22  GLUCOSE 119* 104*  BUN 22 21  CREATININE 1.40* 1.29*  CALCIUM 7.6* 7.9*    Recent Labs  02/07/13 0425  AST 21  ALT 10  ALKPHOS 51  BILITOT 0.4  PROT 5.9*  ALBUMIN 2.7*   No results found for this basename: LIPASE, AMYLASE,  in the last 72 hours  Recent Labs  02/07/13 0425 02/08/13 0614  WBC 12.6* 8.2  HGB 9.2* 9.0*  HCT 27.7* 27.4*  MCV 89.1 89.3  PLT 153 171   No results found for this basename: CKTOTAL, CKMB, TROPONINI,  in the last 72 hours No components found with this basename: POCBNP,  No results found for this basename: DDIMER,  in the last 72 hours No results found for this basename: HGBA1C,  in the last 72 hours No results found for this basename: CHOL, HDL, LDLCALC, TRIG, CHOLHDL,  in the last 72 hours No results found for this basename: TSH, T4TOTAL, FREET3, T3FREE, THYROIDAB,  in the last 72 hours No results found for this basename: VITAMINB12, FOLATE, FERRITIN, TIBC, IRON, RETICCTPCT,  in  the last 72 hours  Medications: Scheduled . ALPRAZolam  0.25 mg Oral QHS  . aspirin EC  81 mg Oral Daily  . atorvastatin  20 mg Oral q1800  . bisacodyl  10 mg Oral Daily  . calcitRIOL  0.5 mcg Oral Daily  . clopidogrel  75 mg Oral Q breakfast  . diltiazem  120 mg Oral Daily  . docusate sodium  100 mg Oral QHS  . doxazosin  2 mg Oral Daily  . levalbuterol  1.25 mg Nebulization Q6H  . levETIRAcetam  500 mg Oral BID  . levothyroxine  100 mcg Oral QAC breakfast  . metoprolol succinate  25 mg Oral Daily  . pantoprazole  40 mg Oral BID  . PARoxetine  30 mg Oral Daily  . sodium chloride  10-40 mL Intracatheter Q12H     Radiology/Studies:  Dg Chest 2 View  02/08/2013   CLINICAL DATA:  Postoperative changes, for followup, patient feeling better  EXAM: CHEST  2 VIEW  COMPARISON:  02/07/2013  FINDINGS: Right chest tube has been removed. Internal jugular line on the right is again seen with tip near the cavoatrial junction. There is no pneumothorax.  Mild cardiac enlargement is stable. There is mild left lower lobe atelectasis. There is  a band of ovoid opacity in the right mid lung zone most consistent with discoid atelectasis. No evidence of pulmonary edema. No significant pleural effusions.  IMPRESSION: Bilateral atelectasis.   Electronically Signed   By: Skipper Cliche M.D.   On: 02/08/2013 08:47   Dg Chest Port 1 View  02/07/2013   CLINICAL DATA:  Postop chest tube evaluation  EXAM: PORTABLE CHEST - 1 VIEW  COMPARISON:  Portable exam 0539 hr compared to 02/06/2013  FINDINGS: Right jugular central venous catheter tip projects over SVC near cavoatrial junction.  Right thoracostomy tube present.  Enlargement of cardiac silhouette post median sternotomy.  Low lung volumes with bibasilar atelectasis.  Mild elevation of right diaphragm with right hemi thorax volume loss.  Minimal central peribronchial thickening.  No definite infiltrate, pleural effusion, or pneumothorax.  IMPRESSION: Stable  postoperative changes.   Electronically Signed   By: Lavonia Dana M.D.   On: 02/07/2013 08:12    INR: Will add last result for INR, ABG once components are confirmed Will add last 4 CBG results once components are confirmed  Assessment/Plan: S/P Procedure(s) (LRB): MEDIAN STERNOTOMY (N/A) RESECTION OF MEDIASTINAL MASS (N/A)  1 SOB/wheeze- cont pulm toilet and nebs. ATX on CXR 2 path - benign, see full report 3 replace K+ 4 H/H stable 5 sugars ok 6 rehab as per routine   LOS: 3 days    GOLD,WAYNE E 1/29/20159:16 AM  patient examined and medical record reviewed,agree with above note. VAN TRIGT III,PETER 02/08/2013

## 2013-02-08 NOTE — Progress Notes (Signed)
Physical Therapy Treatment Patient Details Name: [name] MRN: [id number] DOB: 08/30/1945 Today's Date: 02/08/2013 Time: 9563-8756 PT Time Calculation (min): 18 min  PT Assessment / Plan / Recommendation  History of Present Illness pt admitted for resection of a mediastinal mass via sternotomy   PT Comments   Affect has improved; pt progressing with stability, but still weak.  Still anticipate pt will be able to go home directly.  Hopefully can weak O2.  Follow Up Recommendations  Home health PT;Supervision for mobility/OOB     Does the patient have the potential to tolerate intense rehabilitation     Barriers to Discharge        Equipment Recommendations   (TBA need for 3 in 1 or cane)    Recommendations for Other Services    Frequency Min 3X/week   Progress towards PT Goals Progress towards PT goals: Progressing toward goals  Plan      Precautions / Restrictions Precautions Precautions: Sternal   Pertinent Vitals/Pain sats on 2L at 94%, EHR 90's    Mobility  Bed Mobility Overal bed mobility: Needs Assistance Bed Mobility: Supine to Sit Supine to sit: Min guard (with rail) General bed mobility comments: reinforced sternal precautions Transfers Overall transfer level: Needs assistance Transfers: Sit to/from Stand Sit to Stand: Min guard General transfer comment: reinforced sternal precautions.    Ambulation/Gait Ambulation/Gait assistance: Min guard Ambulation Distance (Feet): 200 Feet Assistive device:  (iv pole) Gait Pattern/deviations: Step-through pattern Gait velocity: slower Gait velocity interpretation: Below normal speed for age/gender General Gait Details: Less unsteady today.  Still looking weak and tired    Exercises     PT Diagnosis:    PT Problem List:   PT Treatment Interventions:     PT Goals (current goals can now be found in the care plan section) Acute Rehab PT Goals PT Goal Formulation: With patient Time For Goal Achievement:  02/14/13 Potential to Achieve Goals: Good  Visit Information  Last PT Received On: 02/08/13 Assistance Needed: +1 History of Present Illness: pt admitted for resection of a mediastinal mass via sternotomy    Subjective Data      Cognition  Cognition Arousal/Alertness: Awake/alert Behavior During Therapy: WFL for tasks assessed/performed Overall Cognitive Status: Within Functional Limits for tasks assessed    Balance  Balance Overall balance assessment: No apparent balance deficits (not formally assessed)  End of Session PT - End of Session Equipment Utilized During Treatment: Oxygen Activity Tolerance: Patient tolerated treatment well Patient left: in bed;with call bell/phone within reach;with family/visitor present Nurse Communication: Mobility status   GP     Allina Riches, Tessie Fass 02/08/2013, 1:07 PM 02/08/2013  Donnella Sham, [place] [phone]  (pager)

## 2013-02-08 NOTE — Progress Notes (Signed)
Ambulated 300 ft in hallway on 2L nasal cannula independently with RN. Returned to bedside chair. Call bell and family near.Cindee Salt

## 2013-02-08 NOTE — Progress Notes (Signed)
Pt with expiratory wheezes. O2 sat 95% on 1L O2. Dr. Prescott Gum made aware of wheezing.  Order for 40 mg IV Lasix, and Xopenex 1.25 nebulizer now and q6hours.  Will continue to assess.  2300-wheezing less at this time. Pt states is easier to breath.

## 2013-02-09 ENCOUNTER — Encounter (HOSPITAL_COMMUNITY): Payer: Self-pay | Admitting: Surgical

## 2013-02-09 ENCOUNTER — Inpatient Hospital Stay (HOSPITAL_COMMUNITY): Payer: Medicare Other

## 2013-02-09 DIAGNOSIS — Q248 Other specified congenital malformations of heart: Secondary | ICD-10-CM | POA: Insufficient documentation

## 2013-02-09 LAB — CBC
HCT: 25.5 % — ABNORMAL LOW (ref 36.0–46.0)
Hemoglobin: 8.6 g/dL — ABNORMAL LOW (ref 12.0–15.0)
MCH: 29.5 pg (ref 26.0–34.0)
MCHC: 33.7 g/dL (ref 30.0–36.0)
MCV: 87.3 fL (ref 78.0–100.0)
Platelets: 188 10*3/uL (ref 150–400)
RBC: 2.92 MIL/uL — ABNORMAL LOW (ref 3.87–5.11)
RDW: 13.2 % (ref 11.5–15.5)
WBC: 6.2 10*3/uL (ref 4.0–10.5)

## 2013-02-09 LAB — BASIC METABOLIC PANEL
BUN: 15 mg/dL (ref 6–23)
CO2: 22 mEq/L (ref 19–32)
Calcium: 7.9 mg/dL — ABNORMAL LOW (ref 8.4–10.5)
Chloride: 108 mEq/L (ref 96–112)
Creatinine, Ser: 1.03 mg/dL (ref 0.50–1.10)
GFR calc Af Amer: 64 mL/min — ABNORMAL LOW (ref >90)
GFR calc non Af Amer: 55 mL/min — ABNORMAL LOW (ref >90)
Glucose, Bld: 99 mg/dL (ref 70–99)
Potassium: 3.3 mEq/L — ABNORMAL LOW (ref 3.7–5.3)
Sodium: 145 mEq/L (ref 137–147)

## 2013-02-09 MED ORDER — FUROSEMIDE 10 MG/ML IJ SOLN
20.0000 mg | Freq: Once | INTRAMUSCULAR | Status: AC
  Administered 2013-02-09: 20 mg via INTRAVENOUS
  Filled 2013-02-09: qty 2

## 2013-02-09 MED ORDER — POTASSIUM CHLORIDE CRYS ER 20 MEQ PO TBCR
40.0000 meq | EXTENDED_RELEASE_TABLET | Freq: Once | ORAL | Status: AC
  Administered 2013-02-09: 40 meq via ORAL
  Filled 2013-02-09: qty 2

## 2013-02-09 MED ORDER — DM-GUAIFENESIN ER 30-600 MG PO TB12
1.0000 | ORAL_TABLET | Freq: Two times a day (BID) | ORAL | Status: DC
  Administered 2013-02-09 – 2013-02-10 (×3): 1 via ORAL
  Filled 2013-02-09 (×4): qty 1

## 2013-02-09 NOTE — Progress Notes (Signed)
Assessment complete per flow sheet. Denies chest pain of shortness of breath. Midline silver hyperfiber dressing removed. Midline incision well approximated with no bleeding or s/s of infection noted. Betadine applied to incision. Call bell and husband near.[name]

## 2013-02-09 NOTE — Discharge Summary (Signed)
[address]       [place] [address]             [phone]       [name] 08-16-1945 68 y.o. [id number]  02/05/2013   Ivin Poot, MD  MEDIASTINAL MASS   HPI:  This is a 68 y.o. female who was referred in cardiothoracic surgical consultation for a mediastinal mass. The mass is measured at 5.5 cm with negative activity on PET scan and felt to be consistent with a thymic or pericardial cyst. She has no recent respiratory complaints or recent weight change. She does have some dyspnea on exertion. Her FEV1 and F. the see her 70% of predicted and the DLCO is 53%. She has no history of tobacco abuse. She has had occasional symptoms of bronchitis. She does have a cardiac history including LAD and placement for which she is on Plavix. She does have history of atypical chest pain is well felt to be due to GI etiology. She was admitted this hospitalization for resection of the mediastinal mass.  Past Medical History   Diagnosis  Date   .  Irritable bowel syndrome    .  Esophageal reflux    .  Dysphagia, unspecified(787.20)    .  Hypertension    .  Depression    .  Hypothyroidism    .  Seizures    .  Iron deficiency anemia    .  Internal hemorrhoids    .  Coronary artery disease      coronary angiogram on 03/14/12 showed Normal left main coronary artery, 50% proximal and 70% mid stenosis of the LAD and distally at takeoff of second diagonal there is a 50-70% stenosis in a very tortuous LAD, 70-80% ostial stenosis of the left circumflex in a small vessel, and 50% instent restenosis of the mid to distal portion of the RCA stent and 50-70% stenosis of the ostial PDA.   Marland Kitchen  Anxiety    .  Arthritis    .  Blood transfusion  1960   .  Heart murmur    .  Hyperlipidemia    .  Myocardial infarction  11/2009     1 stent placed   .  Esophageal stricture    .  Hiatal hernia    .  Shortness of breath     Past Surgical History   Procedure  Laterality  Date   .   Breast surgery   2010     benign cysts   .  Coronary angioplasty with stent placement   2011 and 2012     drug-eluting stent w/ ion stents to the distal right coronary artery    Family History   Problem  Relation  Age of Onset   .  Breast cancer  Sister    .  Breast cancer       niece   .  Heart disease  Father    .  Colon cancer  Neg Hx    .  Heart attack  Mother     Social History  History   Substance Use Topics   .  Smoking status:  Former Smoker -- 0.30 packs/day for 3 years     Types:  Cigarettes     Start date:  11/08/1964     Quit date:  11/09/1967   .  Smokeless tobacco:  Never Used   .  Alcohol Use:  No  Current Outpatient Prescriptions   Medication  Sig  Dispense  Refill   .  ALPRAZolam (XANAX) 0.25 MG tablet  Take 0.25 mg by mouth at bedtime as needed for anxiety.     Marland Kitchen  aspirin EC 81 MG tablet  Take 81 mg by mouth daily.     .  calcitRIOL (ROCALTROL) 0.5 MCG capsule  Take 0.5 mcg by mouth daily.     .  Calcium Carbonate-Vitamin D (CALCIUM + D PO)  Take 1 tablet by mouth daily.     .  clopidogrel (PLAVIX) 75 MG tablet  TAKE 1 TABLET BY MOUTH EVERY DAY  30 tablet  6   .  diltiazem (MATZIM LA) 240 MG 24 hr tablet  Take 240 mg by mouth daily.     Marland Kitchen  docusate sodium (COLACE) 100 MG capsule  Take 100 mg by mouth at bedtime.     Marland Kitchen  doxazosin (CARDURA) 2 MG tablet  TAKE 1 TABLET BY MOUTH EVERY DAY  30 tablet  5   .  isosorbide mononitrate (IMDUR) 60 MG 24 hr tablet  Take 60 mg by mouth daily. Patient takes 90 mg once a day.     .  levETIRAcetam (KEPPRA) 500 MG tablet  Take 500 mg by mouth 2 (two) times daily.     Marland Kitchen  levothyroxine (SYNTHROID, LEVOTHROID) 100 MCG tablet  Take 100 mcg by mouth daily before breakfast.     .  losartan (COZAAR) 100 MG tablet  Take 100 mg by mouth daily.     .  metoprolol succinate (TOPROL-XL) 25 MG 24 hr tablet  Take 25 mg by mouth daily.     .  nitroGLYCERIN (NITROSTAT) 0.4 MG SL tablet  Place 0.4 mg under the tongue every 5 (five) minutes as  needed for chest pain.     .  pantoprazole (PROTONIX) 40 MG tablet  Take 40 mg by mouth 2 (two) times daily.     Marland Kitchen  PARoxetine (PAXIL) 30 MG tablet  Take 30 mg by mouth daily.     .  potassium chloride SA (K-DUR,KLOR-CON) 20 MEQ tablet  Take 20 mEq by mouth 2 (two) times daily.     .  rosuvastatin (CRESTOR) 10 MG tablet  Take 10 mg by mouth every morning.     .  vitamin E 400 UNIT capsule  Take 800 Units by mouth daily.      No current facility-administered medications for this visit.    No Known Allergies     Hospital Course:  The patient was admitted to the hospital and taken to the operating room on 02/05/2013 and underwent Procedure(s): DATE OF PROCEDURE: 02/05/2013  DATE OF DISCHARGE:  OPERATIVE REPORT  OPERATION: Median sternotomy and excision of anterior mediastinal  pericardial cyst.  PREOPERATIVE DIAGNOSIS: A 6-cm anterior mediastinal pericardial cyst  with atypical chest pain.  POSTOPERATIVE DIAGNOSIS: A 6-cm anterior mediastinal pericardial cyst  with atypical chest pain.  SURGEON: Ivin Poot, M.D.  ASSISTANT: Lars Pinks, PA-C.  ANESTHESIA: General.  The patient was left intubated and returned to the ICU in a stable  condition.  Postoperative hospital course:   Overall the patient has progressed nicely. Pathology reveals this to be he benign pericardial cyst. Please see the report for full details. All routine lines, monitors and drainage devices have been discontinued in the standard fashion. The patient is tolerating gradually increasing activities using standard protocols. Incisions are healing well without evidence of infection. The patient does have an  expected acute blood loss anemia which has stabilized. She has required some potassium supplementation for hypokalemia. Renal functions remained stable. She has been restarted on Plavix for her drug-eluting stent. She has had no postoperative dysrhythmias. Currently her status is felt to be tentatively stable  for discharge in the next 24-48 hours pending ongoing reevaluation of her recovery.      Recent Labs  02/08/13 0614 02/09/13 0500  NA 143 145  K 3.2* 3.3*  CL 108 108  CO2 22 22  GLUCOSE 104* 99  BUN 21 15  CALCIUM 7.9* 7.9*    Recent Labs  02/08/13 0614 02/09/13 0500  WBC 8.2 6.2  HGB 9.0* 8.6*  HCT 27.4* 25.5*  PLT 171 188   No results found for this basename: INR,  in the last 72 hours   Discharge Instructions:  The patient is discharged to home with extensive instructions on wound care and progressive ambulation.  They are instructed not to drive or perform any heavy lifting until returning to see the physician in his office.  Discharge Diagnosis:  MEDIASTINAL MASS Pericardial cyst  Secondary Diagnosis: Patient Active Problem List   Diagnosis Date Noted  . Pericardial cyst   . Mediastinal mass 02/05/2013  . Intermediate coronary syndrome 04/02/2012  . Cervical spondylosis 11/17/2011  . Chest pain 11/16/2011  . Hypertension   . Hyperlipidemia   . Hypothyroidism   . History of  seizures   . Depression   . Carpal tunnel syndrome   . Paroxysmal SVT (supraventricular tachycardia) 01/02/2011  . Coronary artery disease   . ANEMIA-UNSPECIFIED 12/12/2009  . History of drug coated stent 12/01/2009  . GERD 01/19/2008  . IBS 01/19/2008   Past Medical History  Diagnosis Date  . Irritable bowel syndrome   . Esophageal reflux   . Dysphagia, unspecified(787.20)   . Hypertension   . Depression   . Hypothyroidism   . Seizures   . Iron deficiency anemia   . Internal hemorrhoids   . Coronary artery disease     coronary angiogram on 03/14/12 showed Normal left main coronary artery, 50% proximal and 70% mid stenosis of the LAD and distally at takeoff of second diagonal there is a 50-70% stenosis in a very tortuous LAD, 70-80% ostial stenosis of the left circumflex in a small vessel, and 50% instent restenosis of the mid to distal portion of the RCA stent and 50-70%  stenosis of the ostial PDA.   Marland Kitchen Anxiety   . Arthritis   . Blood transfusion 1960  . Heart murmur   . Hyperlipidemia   . Myocardial infarction 11/2009    1 stent placed  . Esophageal stricture   . Hiatal hernia   . Shortness of breath   . Complication of anesthesia     woke up durin surgery x 2  . PONV (postoperative nausea and vomiting)   . Pericardial cyst     benign, s/p resection    Medications:   Medication List    STOP taking these medications       vitamin E 400 UNIT capsule      TAKE these medications       ALPRAZolam 0.25 MG tablet  Commonly known as:  XANAX  Take 0.25 mg by mouth at bedtime.     aspirin EC 81 MG tablet  Take 81 mg by mouth daily.     calcitRIOL 0.5 MCG capsule  Commonly known as:  ROCALTROL  Take 0.5 mcg by mouth daily.  CALCIUM + D PO  Take 1 tablet by mouth daily.     clopidogrel 75 MG tablet  Commonly known as:  PLAVIX  Take 75 mg by mouth daily with breakfast.     dextromethorphan-guaiFENesin 30-600 MG per 12 hr tablet  Commonly known as:  MUCINEX DM  Take 1 tablet by mouth 2 (two) times daily as needed for cough.     docusate sodium 100 MG capsule  Commonly known as:  COLACE  Take 100 mg by mouth at bedtime.     doxazosin 2 MG tablet  Commonly known as:  CARDURA  Take 2 mg by mouth daily.     isosorbide mononitrate 60 MG 24 hr tablet  Commonly known as:  IMDUR  Take 90 mg by mouth daily.     levETIRAcetam 500 MG tablet  Commonly known as:  KEPPRA  Take 500 mg by mouth 2 (two) times daily.     levothyroxine 100 MCG tablet  Commonly known as:  SYNTHROID, LEVOTHROID  Take 100 mcg by mouth daily before breakfast.     losartan 100 MG tablet  Commonly known as:  COZAAR  Take 100 mg by mouth daily.     MATZIM LA 240 MG 24 hr tablet  Generic drug:  diltiazem  Take 240 mg by mouth daily.     metoprolol succinate 25 MG 24 hr tablet  Commonly known as:  TOPROL-XL  Take 25 mg by mouth daily.     nitroGLYCERIN 0.4  MG SL tablet  Commonly known as:  NITROSTAT  Place 0.4 mg under the tongue every 5 (five) minutes as needed for chest pain.     pantoprazole 40 MG tablet  Commonly known as:  PROTONIX  Take 1 tablet (40 mg total) by mouth 2 (two) times daily.     PARoxetine 30 MG tablet  Commonly known as:  PAXIL  Take 30 mg by mouth daily.     potassium chloride SA 20 MEQ tablet  Commonly known as:  K-DUR,KLOR-CON  Take 20 mEq by mouth 2 (two) times daily.     rosuvastatin 10 MG tablet  Commonly known as:  CRESTOR  Take 10 mg by mouth every morning.     traMADol 50 MG tablet  Commonly known as:  ULTRAM  Take 1-2 tablets (50-100 mg total) by mouth every 6 (six) hours as needed for moderate pain.        Follow-up Information   Follow up with VAN Wilber Oliphant, MD. (02/28/2013 at 12:30 PM. Please obtain a chest x-ray at 11:30 AM at [facility]. [facility] is located in the same office complex.)    Specialty:  Cardiothoracic Surgery   Contact information:   [address] [place] [address] [phone]      Disposition: Discharged home  Patient's condition is Kathyrn Drown, PA-C 02/10/2013  7:51 AM

## 2013-02-09 NOTE — Discharge Instructions (Signed)
Sternotomy for resection of pericardial mass Care After Refer to this sheet in the next few weeks. These instructions provide you with information on caring for yourself after your procedure. Your health care provider may also give you more specific instructions. Your treatment has been planned according to current medical practices, but problems sometimes occur. Call your health care provider if you have any problems or questions after your procedure. WHAT TO EXPECT AFTER THE PROCEDURE Recovery from surgery will be different for everyone. Some people feel well after 3 or 4 weeks, while for others it takes longer. After your procedure, it is typical to have the following:  Nausea and a lack of appetite.   Constipation.  Weakness and fatigue.   Depression or irritability.   Pain or discomfort at your incision site. HOME CARE INSTRUCTIONS  Only take over-the-counter or prescription medicines as directed by your health care provider. Take all medicines exactly as directed. Do not stop taking medicines or start any new medicines without first checking with your health care provider.   Take your pulse as directed by your health care provider.  Perform deep breathing as directed by your health care provider. If you were given a device called an incentive spirometer, use it to practice deep breathing several times a day. Support your chest with a pillow or your arms when you take deep breaths or cough.  Keep incision areas clean, dry, and protected. Remove or change any bandages (dressings) only as directed by your health care provider. You may have skin adhesive strips over the incision areas. Do not take the strips off. They will fall off on their own.  Check incision areas daily for any swelling, redness, or drainage.  Avoid crossing your legs.   Avoid sitting for long periods of time. Change positions every 30 minutes.   Elevate your legs when you are sitting.   Take showers once  your health care provider approves. Until then, only take sponge baths. Pat incisions dry. Do not rub incisions with a washcloth or towel. Do not take tub baths or go swimming until your health care provider approves.  Eat foods that are high in fiber, such as raw fruits and vegetables, whole grains, beans, and nuts. Meats should be lean cut. Avoid canned, processed, and fried foods.  Drink enough fluids to keep your urine clear or pale yellow.  Weigh yourself every day. This helps identify if you are retaining fluid that may make your heart and lungs work harder.   Rest and limit activity as directed by your health care provider. You may be instructed to:  Stop any activity at once if you have chest pain, shortness of breath, irregular heartbeats, or dizziness. Get help right away if you have any of these symptoms.  Move around frequently for short periods or take short walks as directed by your health care provider. Increase your activities gradually. You may need physical therapy or cardiac rehabilitation to help strengthen your muscles and build your endurance.  Avoid lifting, pushing, or pulling anything heavier than 10 lb (4.5 kg) for at least 6 weeks after surgery.  Do not drive until your health care provider approves.  Ask your health care provider when you may return to work and resume sexual activity.  Follow up with your health care provider as directed.  SEEK MEDICAL CARE IF:  You have swelling, redness, increasing pain, or drainage at the site of an incision.   You develop a fever.   You have  swelling in your ankles or legs.   You have pain in your legs.   You have weight gain of 2 or more pounds a day.  You are nauseous or vomit.  You have diarrhea. SEEK IMMEDIATE MEDICAL CARE IF:  You have chest pain that goes to your jaw or arms.  You have shortness of breath.   You have a fast or irregular heartbeat.   You notice a "clicking" in your breastbone  (sternum) when you move.   You have numbness or weakness in your arms or legs.  You feel dizzy or lightheaded.  MAKE SURE YOU:  Understand these instructions.  Will watch your condition.  Will get help right away if you are not doing well or get worse. Document Released: 07/17/2004 Document Revised: 08/30/2012 Document Reviewed: 06/06/2012 [facility] Patient Information 2014 [place].

## 2013-02-09 NOTE — Progress Notes (Addendum)
Patient ambulated independently 300 ft on 1.5L nasal cannula with student nurse. Returned bedside chair in room w/out incidence. Call bell and family near.[name]

## 2013-02-09 NOTE — Progress Notes (Signed)
Patient ambulated 30 ft non-stop in hallway with RN on room air. O2 sats remained 90-93% while ambulating. O2 sat decreased to 88% during last 20 ft. Patient mildly short of breath.  Assisted patient back to bed.  Remains on 1.5L nasal cannula for comfort. Call bell and husband near.Cindee Salt

## 2013-02-09 NOTE — Progress Notes (Addendum)
[address]       [place], [address]             [phone]      4 Days Post-Op  Procedure(s) (LRB): MEDIAN STERNOTOMY (N/A) RESECTION OF MEDIASTINAL MASS (N/A) Subjective: Says she feels better Breathing is more comfortable  Objective  Telemetry sinus rhythm  Temp:  [98.1 F (36.7 C)-99 F (37.2 C)] 98.1 F (36.7 C) (01/30 0424) Pulse Rate:  [82-89] 89 (01/30 0424) Resp:  [20] 20 (01/30 0424) BP: (115-165)/(84-87) 165/87 mmHg (01/30 0424) SpO2:  [98 %] 98 % (01/30 0424)   Intake/Output Summary (Last 24 hours) at 02/09/13 0845 Last data filed at 02/09/13 0424  Gross per 24 hour  Intake    240 ml  Output    500 ml  Net   -260 ml       General appearance: alert, cooperative and no distress Heart: regular rate and rhythm Lungs: much less wheeze, fair air exchange, dim in bases Abdomen: benign Extremities: no edema Wound: incis healing well  Lab Results:  Recent Labs  02/08/13 0614 02/09/13 0500  NA 143 145  K 3.2* 3.3*  CL 108 108  CO2 22 22  GLUCOSE 104* 99  BUN 21 15  CREATININE 1.29* 1.03  CALCIUM 7.9* 7.9*    Recent Labs  02/07/13 0425  AST 21  ALT 10  ALKPHOS 51  BILITOT 0.4  PROT 5.9*  ALBUMIN 2.7*   No results found for this basename: LIPASE, AMYLASE,  in the last 72 hours  Recent Labs  02/08/13 0614 02/09/13 0500  WBC 8.2 6.2  HGB 9.0* 8.6*  HCT 27.4* 25.5*  MCV 89.3 87.3  PLT 171 188   No results found for this basename: CKTOTAL, CKMB, TROPONINI,  in the last 72 hours No components found with this basename: POCBNP,  No results found for this basename: DDIMER,  in the last 72 hours No results found for this basename: HGBA1C,  in the last 72 hours No results found for this basename: CHOL, HDL, LDLCALC, TRIG, CHOLHDL,  in the last 72 hours No results found for this basename: TSH, T4TOTAL, FREET3, T3FREE, THYROIDAB,  in the last 72 hours No results found for this basename: VITAMINB12, FOLATE,  FERRITIN, TIBC, IRON, RETICCTPCT,  in the last 72 hours  Medications: Scheduled . ALPRAZolam  0.25 mg Oral QHS  . aspirin EC  81 mg Oral Daily  . atorvastatin  20 mg Oral q1800  . bisacodyl  10 mg Oral Daily  . calcitRIOL  0.5 mcg Oral Daily  . clopidogrel  75 mg Oral Q breakfast  . diltiazem  120 mg Oral Daily  . docusate sodium  100 mg Oral QHS  . doxazosin  2 mg Oral Daily  . levalbuterol  1.25 mg Nebulization Q6H  . levETIRAcetam  500 mg Oral BID  . levothyroxine  100 mcg Oral QAC breakfast  . metoprolol succinate  25 mg Oral Daily  . pantoprazole  40 mg Oral BID  . PARoxetine  30 mg Oral Daily  . sodium chloride  10-40 mL Intracatheter Q12H     Radiology/Studies:  Dg Chest 2 View  02/08/2013   CLINICAL DATA:  Postoperative changes, for followup, patient feeling better  EXAM: CHEST  2 VIEW  COMPARISON:  02/07/2013  FINDINGS: Right chest tube has been removed. Internal jugular line on the right is again seen with tip near the cavoatrial junction. There is no pneumothorax.  Mild cardiac enlargement is stable. There is mild left lower lobe atelectasis. There is a band of ovoid opacity in the right mid lung zone most consistent with discoid atelectasis. No evidence of pulmonary edema. No significant pleural effusions.  IMPRESSION: Bilateral atelectasis.   Electronically Signed   By: Skipper Cliche M.D.   On: 02/08/2013 08:47    INR: Will add last result for INR, ABG once components are confirmed Will add last 4 CBG results once components are confirmed  Assessment/Plan: S/P Procedure(s) (LRB): MEDIAN STERNOTOMY (N/A) RESECTION OF MEDIASTINAL MASS (N/A)  1 feels better, pulm ststus improving, cont nebs/pulm toilet 2 H/H slightly down, follow closely 3 replace K+ 4 sugars ok 5 push rehab 6 poss home 24-48 hours   LOS: 4 days    GOLD,WAYNE E 1/30/20158:45 AM  patient examined and medical record reviewed,agree with above note.   Patient needs to resume her  preoperative Plavix for drug-eluting stent placed April 2014 VAN TRIGT III,PETER 02/09/2013

## 2013-02-09 NOTE — Progress Notes (Signed)
Patient ambulated independently with husband in hallway on room air. Patient improves with each walk. Returned to room w/out incidence. Patient has been using I/S frequently today as instructed. Able to use I/S to 750 to 1000 ml. Denies any distress at this time. Evening medications given. Call bell near.Cindee Salt

## 2013-02-10 MED ORDER — FUROSEMIDE 40 MG PO TABS
40.0000 mg | ORAL_TABLET | Freq: Once | ORAL | Status: AC
  Administered 2013-02-10: 40 mg via ORAL
  Filled 2013-02-10: qty 1

## 2013-02-10 MED ORDER — TRAMADOL HCL 50 MG PO TABS: 50.0000 mg | ORAL_TABLET | Freq: Four times a day (QID) | ORAL | Status: DC | PRN

## 2013-02-10 MED ORDER — POTASSIUM CHLORIDE CRYS ER 20 MEQ PO TBCR
20.0000 meq | EXTENDED_RELEASE_TABLET | Freq: Once | ORAL | Status: AC
  Administered 2013-02-10: 20 meq via ORAL
  Filled 2013-02-10: qty 1

## 2013-02-10 MED ORDER — DM-GUAIFENESIN ER 30-600 MG PO TB12: 1.0000 | ORAL_TABLET | Freq: Two times a day (BID) | ORAL | Status: DC | PRN

## 2013-02-10 NOTE — Progress Notes (Addendum)
      [address]       [place] [address]             [phone]        5 Days Post-Op Procedure(s) (LRB): MEDIAN STERNOTOMY (N/A) RESECTION OF MEDIASTINAL MASS (N/A)  Subjective: She just finished breakfast. She has no complaints. She feels fairly well.  Objective: Vital signs in last 24 hours: Temp:  [97.5 F (36.4 C)-98.4 F (36.9 C)] 97.5 F (36.4 C) (01/31 0459) Pulse Rate:  [68-94] 76 (01/31 0459) Cardiac Rhythm:  [-] Normal sinus rhythm (01/30 1938) Resp:  [18] 18 (01/31 0459) BP: (153-168)/(63-94) 153/88 mmHg (01/31 0459) SpO2:  [95 %-97 %] 96 % (01/31 0459) Weight:  [69 kg (152 lb 1.9 oz)] 69 kg (152 lb 1.9 oz) (01/31 0459)   Current Weight  02/10/13 69 kg (152 lb 1.9 oz)      Intake/Output from previous day: 01/30 0701 - 01/31 0700 In: 360 [P.O.:360] Out: 450 [Urine:450]   Physical Exam:  Cardiovascular: RRR, no murmurs, gallops, or rubs. Pulmonary: Clear to auscultation bilaterally; no rales, wheezes, or rhonchi. Abdomen: Soft, non tender, bowel sounds present. Extremities: Trace ilateral lower extremity edema. Wounds: Clean and dry.  No erythema or signs of infection.  Lab Results: CBC: Recent Labs  02/08/13 0614 02/09/13 0500  WBC 8.2 6.2  HGB 9.0* 8.6*  HCT 27.4* 25.5*  PLT 171 188   BMET:  Recent Labs  02/08/13 0614 02/09/13 0500  NA 143 145  K 3.2* 3.3*  CL 108 108  CO2 22 22  GLUCOSE 104* 99  BUN 21 15  CREATININE 1.29* 1.03  CALCIUM 7.9* 7.9*    PT/INR:  Lab Results  Component Value Date   INR 0.99 02/01/2013   INR 1.12 10/24/2012   INR 1.09 04/03/2012   ABG:  INR: Will add last result for INR, ABG once components are confirmed Will add last 4 CBG results once components are confirmed  Assessment/Plan:  1. CV - SR. On Diltiazem  120 and was on 240 LA pre op. So will discharge on pre op dose. Continue Plavix and Cardura. 2.  Pulmonary - Encourage incentive spirometer 3. Mild volume Overload -  Will give Lasix today. Will not need at discharge. 4.  Acute blood loss anemia - H and H yesterday 8.6 and 25.5.  5. Remove central line and sutures 6.Discharge home  [name] 02/10/2013,7:32 AM  I have seen and examined [name] and agree with the above assessment  and plan.  Grace Isaac MD Beeper [phone] Office [phone] 02/10/2013 11:36 AM

## 2013-02-10 NOTE — Progress Notes (Signed)
CT sutures removed per protocol, edges intact, benzoin and steristrips applied Joylene Draft A

## 2013-02-11 NOTE — Discharge Summary (Signed)
patient examined and medical record reviewed,agree with above note. VAN TRIGT III,PETER 02/11/2013   

## 2013-02-12 ENCOUNTER — Ambulatory Visit: Payer: BC Managed Care – PPO | Admitting: Neurology

## 2013-02-21 ENCOUNTER — Telehealth: Payer: Self-pay | Admitting: Internal Medicine

## 2013-02-21 MED ORDER — PANTOPRAZOLE SODIUM 40 MG PO TBEC: 40.0000 mg | DELAYED_RELEASE_TABLET | Freq: Two times a day (BID) | ORAL | Status: DC

## 2013-02-21 NOTE — Telephone Encounter (Signed)
Pantoprazole rx originally went to [facility].  Resent rx to [facility] per patient's request

## 2013-02-23 ENCOUNTER — Other Ambulatory Visit: Payer: Self-pay | Admitting: *Deleted

## 2013-02-23 DIAGNOSIS — J9859 Other diseases of mediastinum, not elsewhere classified: Secondary | ICD-10-CM

## 2013-02-28 ENCOUNTER — Ambulatory Visit: Payer: Medicare Other | Admitting: Cardiothoracic Surgery

## 2013-02-28 ENCOUNTER — Ambulatory Visit (INDEPENDENT_AMBULATORY_CARE_PROVIDER_SITE_OTHER): Payer: Medicare Other | Admitting: Cardiothoracic Surgery

## 2013-02-28 ENCOUNTER — Ambulatory Visit
Admission: RE | Admit: 2013-02-28 | Discharge: 2013-02-28 | Disposition: A | Discharge status: Home / Self Care | Payer: Medicare Other | Source: Ambulatory Visit | Attending: Cardiothoracic Surgery | Admitting: Cardiothoracic Surgery

## 2013-02-28 ENCOUNTER — Other Ambulatory Visit: Payer: Self-pay | Admitting: Cardiothoracic Surgery

## 2013-02-28 ENCOUNTER — Encounter: Payer: Self-pay | Admitting: Cardiothoracic Surgery

## 2013-02-28 VITALS — BP 128/77 | HR 66 | Resp 20 | Ht 59.0 in | Wt 152.0 lb

## 2013-02-28 DIAGNOSIS — J9859 Other diseases of mediastinum, not elsewhere classified: Secondary | ICD-10-CM

## 2013-02-28 DIAGNOSIS — Z09 Encounter for follow-up examination after completed treatment for conditions other than malignant neoplasm: Secondary | ICD-10-CM

## 2013-02-28 DIAGNOSIS — J984 Other disorders of lung: Secondary | ICD-10-CM | POA: Diagnosis not present

## 2013-02-28 DIAGNOSIS — R222 Localized swelling, mass and lump, trunk: Secondary | ICD-10-CM

## 2013-02-28 DIAGNOSIS — D649 Anemia, unspecified: Secondary | ICD-10-CM | POA: Diagnosis not present

## 2013-02-28 LAB — CBC
HCT: 32.8 % — ABNORMAL LOW (ref 36.0–46.0)
Hemoglobin: 11 g/dL — ABNORMAL LOW (ref 12.0–15.0)
MCH: 28.7 pg (ref 26.0–34.0)
MCHC: 33.5 g/dL (ref 30.0–36.0)
MCV: 85.6 fL (ref 78.0–100.0)
Platelets: 402 10*3/uL — ABNORMAL HIGH (ref 150–400)
RBC: 3.83 MIL/uL — ABNORMAL LOW (ref 3.87–5.11)
RDW: 13.9 % (ref 11.5–15.5)
WBC: 7.7 10*3/uL (ref 4.0–10.5)

## 2013-02-28 NOTE — Progress Notes (Signed)
PCP is Reginia Naas, MD Referring Provider is Irene Shipper, MD  Chief Complaint  Patient presents with  . Routine Post Op    F/U from surgery with CXR S/P  median sternotomy with excision of anterior mediastinal anterior pericadial cyst on 02/05/13    HPI: One month followup after resection of a large pericardial cyst in the anterior mediastinum via median sternotomy. Patient still complains of low energy . Surgical incision well-healed. No arrhythmias or symptoms of CHF. Patient appears pale and does have history of anemia. Hemoglobin time of discharge is 8.6 and she is not taking iron    Past Medical History  Diagnosis Date  . Irritable bowel syndrome   . Esophageal reflux   . Dysphagia, unspecified(787.20)   . Hypertension   . Depression   . Hypothyroidism   . Seizures   . Iron deficiency anemia   . Internal hemorrhoids   . Coronary artery disease     coronary angiogram on 03/14/12 showed Normal left main coronary artery, 50% proximal and 70% mid stenosis of the LAD and distally at takeoff of second diagonal there is a 50-70% stenosis in a very tortuous LAD, 70-80% ostial stenosis of the left circumflex in a small vessel, and 50% instent restenosis of the mid to distal portion of the RCA stent and 50-70% stenosis of the ostial PDA.   Marland Kitchen Anxiety   . Arthritis   . Blood transfusion 1960  . Heart murmur   . Hyperlipidemia   . Myocardial infarction 11/2009    1 stent placed  . Esophageal stricture   . Hiatal hernia   . Shortness of breath   . Complication of anesthesia     woke up durin surgery x 2  . PONV (postoperative nausea and vomiting)   . Pericardial cyst     benign, s/p resection    Past Surgical History  Procedure Laterality Date  . Breast surgery  2010     benign cysts  . Coronary angioplasty with stent placement  2011 and 2012    drug-eluting stent w/ ion stents to the distal right coronary artery  . Colonoscopy  2013 or 2014  . Mediasternotomy N/A  02/05/2013    Procedure: MEDIAN STERNOTOMY;  Surgeon: Ivin Poot, MD;  Location: [facility] OR;  Service: Thoracic;  Laterality: N/A;  . Biopsy of mediastinal mass N/A 02/05/2013    Procedure: RESECTION OF MEDIASTINAL MASS;  Surgeon: Ivin Poot, MD;  Location: [facility] OR;  Service: Thoracic;  Laterality: N/A;    Family History  Problem Relation Age of Onset  . Breast cancer Sister   . Breast cancer      niece  . Heart disease Father   . Colon cancer Neg Hx   . Heart attack Mother     Social History History  Substance Use Topics  . Smoking status: Former Smoker -- 0.30 packs/day for 3 years    Types: Cigarettes    Start date: 11/08/1964    Quit date: 11/09/1967  . Smokeless tobacco: Never Used  . Alcohol Use: No    Current Outpatient Prescriptions  Medication Sig Dispense Refill  . ALPRAZolam (XANAX) 0.25 MG tablet Take 0.25 mg by mouth at bedtime.       Marland Kitchen aspirin EC 81 MG tablet Take 81 mg by mouth daily.      . calcitRIOL (ROCALTROL) 0.5 MCG capsule Take 0.5 mcg by mouth daily.       . Calcium Carbonate-Vitamin D (CALCIUM + D  PO) Take 1 tablet by mouth daily.      . clopidogrel (PLAVIX) 75 MG tablet Take 75 mg by mouth daily with breakfast.      . dextromethorphan-guaiFENesin (MUCINEX DM) 30-600 MG per 12 hr tablet Take 1 tablet by mouth 2 (two) times daily as needed for cough.      . diltiazem (MATZIM LA) 240 MG 24 hr tablet Take 240 mg by mouth daily.      Marland Kitchen docusate sodium (COLACE) 100 MG capsule Take 100 mg by mouth at bedtime.      Marland Kitchen doxazosin (CARDURA) 2 MG tablet Take 2 mg by mouth daily.      . isosorbide mononitrate (IMDUR) 60 MG 24 hr tablet Take 90 mg by mouth daily.       Marland Kitchen levETIRAcetam (KEPPRA) 500 MG tablet Take 500 mg by mouth 2 (two) times daily.       Marland Kitchen levothyroxine (SYNTHROID, LEVOTHROID) 100 MCG tablet Take 100 mcg by mouth daily before breakfast.       . losartan (COZAAR) 100 MG tablet Take 100 mg by mouth daily.      . metoprolol succinate (TOPROL-XL) 25  MG 24 hr tablet Take 25 mg by mouth daily.       . nitroGLYCERIN (NITROSTAT) 0.4 MG SL tablet Place 0.4 mg under the tongue every 5 (five) minutes as needed for chest pain.      . pantoprazole (PROTONIX) 40 MG tablet Take 1 tablet (40 mg total) by mouth 2 (two) times daily.  60 tablet  11  . PARoxetine (PAXIL) 30 MG tablet Take 30 mg by mouth daily.       . potassium chloride SA (K-DUR,KLOR-CON) 20 MEQ tablet Take 20 mEq by mouth 2 (two) times daily.      . rosuvastatin (CRESTOR) 10 MG tablet Take 10 mg by mouth every morning.       . traMADol (ULTRAM) 50 MG tablet Take 1-2 tablets (50-100 mg total) by mouth every 6 (six) hours as needed for moderate pain.  30 tablet  0   No current facility-administered medications for this visit.    No Known Allergies  Review of SystemsWalking around the house Using a sense parameter Using tramadol for pain Sleeping well  BP 128/77  Pulse 66  Resp 20  Ht 4\' 11"  (1.499 m)  Wt 152 lb (68.947 kg)  BMI 30.68 kg/m2  SpO2 98% Physical Exam Alert comfortable  Lungs clear  Heart rate regular  Sternotomy incision well-healed  No peripheral edema or calf tenderness    Diagnostic Tests: Chest x-ray clear with out pleural effusion, sternal wires intact cardiac silhouette normal  Impression: One month postop resection of large pericardial cyst Patient will start routine household daily activities and short distance driving. No lifting more than 10 pounds until next visit in 6 weeks.  Plan:Plan CT of the chest approximately one year after surgery. Check CBC today to assess for anemia. Prescription for iron and folic acid provided patient today.

## 2013-03-02 ENCOUNTER — Other Ambulatory Visit: Payer: Medicare Other

## 2013-03-07 ENCOUNTER — Other Ambulatory Visit: Payer: Medicare Other

## 2013-03-07 ENCOUNTER — Ambulatory Visit (INDEPENDENT_AMBULATORY_CARE_PROVIDER_SITE_OTHER): Payer: Medicare Other | Admitting: *Deleted

## 2013-03-07 DIAGNOSIS — I2510 Atherosclerotic heart disease of native coronary artery without angina pectoris: Secondary | ICD-10-CM | POA: Diagnosis not present

## 2013-03-07 DIAGNOSIS — I471 Supraventricular tachycardia: Secondary | ICD-10-CM

## 2013-03-07 DIAGNOSIS — I10 Essential (primary) hypertension: Secondary | ICD-10-CM

## 2013-03-07 DIAGNOSIS — E785 Hyperlipidemia, unspecified: Secondary | ICD-10-CM

## 2013-03-07 LAB — ALT: ALT: 9 U/L (ref 0–35)

## 2013-03-07 LAB — LIPID PANEL
CHOL/HDL RATIO: 2
CHOLESTEROL: 147 mg/dL (ref 0–200)
HDL: 72.4 mg/dL (ref >39.00)
LDL Cholesterol: 62 mg/dL (ref 0–99)
TRIGLYCERIDES: 64 mg/dL (ref 0.0–149.0)
VLDL: 12.8 mg/dL (ref 0.0–40.0)

## 2013-03-09 ENCOUNTER — Other Ambulatory Visit: Payer: Self-pay | Admitting: General Surgery

## 2013-03-09 ENCOUNTER — Encounter: Payer: Self-pay | Admitting: General Surgery

## 2013-03-09 DIAGNOSIS — E785 Hyperlipidemia, unspecified: Secondary | ICD-10-CM

## 2013-03-11 ENCOUNTER — Other Ambulatory Visit: Payer: Self-pay | Admitting: Neurology

## 2013-03-14 ENCOUNTER — Ambulatory Visit: Payer: Medicare Other | Admitting: Cardiology

## 2013-03-27 DIAGNOSIS — R5383 Other fatigue: Secondary | ICD-10-CM | POA: Diagnosis not present

## 2013-03-27 DIAGNOSIS — R634 Abnormal weight loss: Secondary | ICD-10-CM | POA: Diagnosis not present

## 2013-03-27 DIAGNOSIS — R5381 Other malaise: Secondary | ICD-10-CM | POA: Diagnosis not present

## 2013-03-27 DIAGNOSIS — D509 Iron deficiency anemia, unspecified: Secondary | ICD-10-CM | POA: Diagnosis not present

## 2013-03-27 DIAGNOSIS — E7800 Pure hypercholesterolemia, unspecified: Secondary | ICD-10-CM | POA: Diagnosis not present

## 2013-04-11 ENCOUNTER — Ambulatory Visit: Payer: Medicare Other | Admitting: Cardiothoracic Surgery

## 2013-04-16 ENCOUNTER — Other Ambulatory Visit: Payer: Self-pay | Admitting: *Deleted

## 2013-04-16 DIAGNOSIS — Z8774 Personal history of (corrected) congenital malformations of heart and circulatory system: Secondary | ICD-10-CM

## 2013-04-18 ENCOUNTER — Encounter: Payer: Self-pay | Admitting: Cardiothoracic Surgery

## 2013-04-18 ENCOUNTER — Ambulatory Visit
Admission: RE | Admit: 2013-04-18 | Discharge: 2013-04-18 | Disposition: A | Discharge status: Home / Self Care | Payer: Medicare Other | Source: Ambulatory Visit | Attending: Cardiothoracic Surgery | Admitting: Cardiothoracic Surgery

## 2013-04-18 ENCOUNTER — Ambulatory Visit (INDEPENDENT_AMBULATORY_CARE_PROVIDER_SITE_OTHER): Payer: Medicare Other | Admitting: Cardiothoracic Surgery

## 2013-04-18 VITALS — BP 130/84 | HR 60 | Resp 20 | Ht 59.0 in | Wt 144.0 lb

## 2013-04-18 DIAGNOSIS — Z8774 Personal history of (corrected) congenital malformations of heart and circulatory system: Secondary | ICD-10-CM

## 2013-04-18 DIAGNOSIS — D649 Anemia, unspecified: Secondary | ICD-10-CM

## 2013-04-18 DIAGNOSIS — Z09 Encounter for follow-up examination after completed treatment for conditions other than malignant neoplasm: Secondary | ICD-10-CM

## 2013-04-18 DIAGNOSIS — Z9889 Other specified postprocedural states: Secondary | ICD-10-CM

## 2013-04-18 DIAGNOSIS — J984 Other disorders of lung: Secondary | ICD-10-CM | POA: Diagnosis not present

## 2013-04-18 NOTE — Progress Notes (Signed)
PCP is Reginia Naas, MD Referring Provider is Irene Shipper, MD  Chief Complaint  Patient presents with  . Routine Post Op    6 week f/u with CXR    HPI: Patient returns for a final office visit after a going sternotomy and resection of a large pericardial cyst along the right margin of the pericardium. This was benign. She is now doing very well appears to be strong and ready to resume normal daily activities. The surgical incision is well-healed. There is no evidence of recurrent pericardial fluid on exam. She is not having significant pain following surgery. She will return for a 6 month followup of the CT scan of the chest.   Past Medical History  Diagnosis Date  . Irritable bowel syndrome   . Esophageal reflux   . Dysphagia, unspecified(787.20)   . Hypertension   . Depression   . Hypothyroidism   . Seizures   . Iron deficiency anemia   . Internal hemorrhoids   . Coronary artery disease     coronary angiogram on 03/14/12 showed Normal left main coronary artery, 50% proximal and 70% mid stenosis of the LAD and distally at takeoff of second diagonal there is a 50-70% stenosis in a very tortuous LAD, 70-80% ostial stenosis of the left circumflex in a small vessel, and 50% instent restenosis of the mid to distal portion of the RCA stent and 50-70% stenosis of the ostial PDA.   Marland Kitchen Anxiety   . Arthritis   . Blood transfusion 1960  . Heart murmur   . Hyperlipidemia   . Myocardial infarction 11/2009    1 stent placed  . Esophageal stricture   . Hiatal hernia   . Shortness of breath   . Complication of anesthesia     woke up durin surgery x 2  . PONV (postoperative nausea and vomiting)   . Pericardial cyst     benign, s/p resection    Past Surgical History  Procedure Laterality Date  . Breast surgery  2010     benign cysts  . Coronary angioplasty with stent placement  2011 and 2012    drug-eluting stent w/ ion stents to the distal right coronary artery  . Colonoscopy   2013 or 2014  . Mediasternotomy N/A 02/05/2013    Procedure: MEDIAN STERNOTOMY;  Surgeon: Ivin Poot, MD;  Location: [facility] OR;  Service: Thoracic;  Laterality: N/A;  . Biopsy of mediastinal mass N/A 02/05/2013    Procedure: RESECTION OF MEDIASTINAL MASS;  Surgeon: Ivin Poot, MD;  Location: [facility] OR;  Service: Thoracic;  Laterality: N/A;    Family History  Problem Relation Age of Onset  . Breast cancer Sister   . Breast cancer      niece  . Heart disease Father   . Colon cancer Neg Hx   . Heart attack Mother     Social History History  Substance Use Topics  . Smoking status: Former Smoker -- 0.30 packs/day for 3 years    Types: Cigarettes    Start date: 11/08/1964    Quit date: 11/09/1967  . Smokeless tobacco: Never Used  . Alcohol Use: No    Current Outpatient Prescriptions  Medication Sig Dispense Refill  . ALPRAZolam (XANAX) 0.25 MG tablet Take 0.25 mg by mouth at bedtime.       Marland Kitchen aspirin EC 81 MG tablet Take 81 mg by mouth daily.      . calcitRIOL (ROCALTROL) 0.5 MCG capsule Take 0.5 mcg by mouth  daily.       . Calcium Carbonate-Vitamin D (CALCIUM + D PO) Take 1 tablet by mouth daily.      . clopidogrel (PLAVIX) 75 MG tablet Take 75 mg by mouth daily with breakfast.      . diltiazem (MATZIM LA) 240 MG 24 hr tablet Take 240 mg by mouth daily.      Marland Kitchen docusate sodium (COLACE) 100 MG capsule Take 100 mg by mouth at bedtime.      Marland Kitchen doxazosin (CARDURA) 2 MG tablet Take 2 mg by mouth daily.      . isosorbide mononitrate (IMDUR) 60 MG 24 hr tablet Take 90 mg by mouth daily.       Marland Kitchen levETIRAcetam (KEPPRA) 500 MG tablet TAKE 1 TABLET BY MOUTH TWICE A DAY  60 tablet  5  . levothyroxine (SYNTHROID, LEVOTHROID) 100 MCG tablet Take 100 mcg by mouth daily before breakfast.       . losartan (COZAAR) 100 MG tablet Take 100 mg by mouth daily.      . metoprolol succinate (TOPROL-XL) 25 MG 24 hr tablet Take 25 mg by mouth daily.       . nitroGLYCERIN (NITROSTAT) 0.4 MG SL tablet Place  0.4 mg under the tongue every 5 (five) minutes as needed for chest pain.      . pantoprazole (PROTONIX) 40 MG tablet Take 1 tablet (40 mg total) by mouth 2 (two) times daily.  60 tablet  11  . PARoxetine (PAXIL) 30 MG tablet Take 30 mg by mouth daily.       . potassium chloride SA (K-DUR,KLOR-CON) 20 MEQ tablet Take 20 mEq by mouth 2 (two) times daily.      . rosuvastatin (CRESTOR) 10 MG tablet Take 10 mg by mouth every morning.        No current facility-administered medications for this visit.    No Known Allergies  Review of Systems no fever or drainage from the incision. Improved appetite and overall strength  BP 130/84  Pulse 60  Resp 20  Ht 4\' 11"  (1.499 m)  Wt 144 lb (65.318 kg)  BMI 29.07 kg/m2  SpO2 97% Physical Exam Alert and oriented and comfortable accompanied by her husband Lungs clear sternum well-healed Heart rhythm regular without murmur rub or gallop No pedal edema  neuro intact  Diagnostic Tests: Chest x-rays clear with normal cardiac silhouette, mild elevation right hemidiaphragm stable  Impression: Doing well 2 months after surgery. She will increase her activity, she may resume driving. She returns for a chest CT scan 6 months postop  Plan: Return in 3-4 months with CT scan of chest

## 2013-04-20 DIAGNOSIS — M1710 Unilateral primary osteoarthritis, unspecified knee: Secondary | ICD-10-CM | POA: Diagnosis not present

## 2013-04-20 DIAGNOSIS — IMO0002 Reserved for concepts with insufficient information to code with codable children: Secondary | ICD-10-CM | POA: Diagnosis not present

## 2013-05-01 DIAGNOSIS — R944 Abnormal results of kidney function studies: Secondary | ICD-10-CM | POA: Diagnosis not present

## 2013-05-24 ENCOUNTER — Encounter: Payer: Self-pay | Admitting: Cardiology

## 2013-05-24 ENCOUNTER — Ambulatory Visit (INDEPENDENT_AMBULATORY_CARE_PROVIDER_SITE_OTHER): Payer: Medicare Other | Admitting: Cardiology

## 2013-05-24 VITALS — BP 118/68 | HR 58 | Ht 59.0 in | Wt 143.0 lb

## 2013-05-24 DIAGNOSIS — I10 Essential (primary) hypertension: Secondary | ICD-10-CM | POA: Diagnosis not present

## 2013-05-24 DIAGNOSIS — E785 Hyperlipidemia, unspecified: Secondary | ICD-10-CM

## 2013-05-24 DIAGNOSIS — I2510 Atherosclerotic heart disease of native coronary artery without angina pectoris: Secondary | ICD-10-CM

## 2013-05-24 DIAGNOSIS — Q248 Other specified congenital malformations of heart: Secondary | ICD-10-CM | POA: Diagnosis not present

## 2013-05-24 NOTE — Patient Instructions (Signed)
Your physician recommends that you continue on your current medications as directed. Please refer to the Current Medication list given to you today.  Your physician wants you to follow-up in: 6 months with Dr Turner You will receive a reminder letter in the mail two months in advance. If you don't receive a letter, please call our office to schedule the follow-up appointment.  

## 2013-05-24 NOTE — Progress Notes (Signed)
[place], [place],   [address] Phone: [phone] Fax:  [phone]  Date:  05/24/2013   ID:  [name], DOB [birth year], MRN [id number]  PCP:  Reginia Naas, MD  Cardiologist:  Fransico Him, MD     History of Present Illness: [name] is a 68 y.o. female with a history of CAD/HTN and lipids who presents today for followup. She was recently in the hospital for atypical CP and had a normal nuclear stress test. She underwent Gi eval which was negative for gallstones and she underwent esophageal dilatation but returned to the ER with recurrent CP and was found to have a 6cm mediastinal mass in front of the ascending aorta. She was seen by Dr. 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 Plan was for PET scan and PFTs. PET scan did not show mass to be hypermetabolic. It was felt that abnormality may be due to a pericardial cyst. She underwent median sternotomy with excision of anterior mediastinal anterior pericardial cyst on 02/05/2013.  Since her surgery she has not had any anginal CP.  She has some SOB going up stairs or hills.  She has started back at the gym slowly.  She denies any LE edema, dizziness, palpitations.     Wt Readings from Last 3 Encounters:  05/24/13 143 lb (64.864 kg)  04/18/13 144 lb (65.318 kg)  02/28/13 152 lb (68.947 kg)     Past Medical History  Diagnosis Date  . Irritable bowel syndrome   . Esophageal reflux   . Dysphagia, unspecified(787.20)   . Hypertension   . Depression   . Hypothyroidism   . Seizures   . Iron deficiency anemia   . Internal hemorrhoids   . Coronary artery disease     coronary angiogram on 03/14/12 showed Normal left main coronary artery, 50% proximal and 70% mid stenosis of the LAD and distally at takeoff of second diagonal there is a 50-70% stenosis in a very tortuous LAD, 70-80% ostial stenosis of the left circumflex in a small vessel, and 50% instent restenosis of the mid  to distal portion of the RCA stent and 50-70% stenosis of the ostial PDA.   Marland Kitchen Anxiety   . Arthritis   . Blood transfusion 1960  . Heart murmur   . Hyperlipidemia   . Myocardial infarction 11/2009    1 stent placed  . Esophageal stricture   . Hiatal hernia   . Shortness of breath   . Complication of anesthesia     woke up durin surgery x 2  . PONV (postoperative nausea and vomiting)   . Pericardial cyst     benign, s/p resection    Current Outpatient Prescriptions  Medication Sig Dispense Refill  . ALPRAZolam (XANAX) 0.25 MG tablet Take 0.25 mg by mouth at bedtime.       Marland Kitchen aspirin EC 81 MG tablet Take 81 mg by mouth daily.      . calcitRIOL (ROCALTROL) 0.5 MCG capsule Take 0.5 mcg by mouth daily.       . Calcium Carbonate-Vitamin D (CALCIUM + D PO) Take 1 tablet by mouth daily.      . clopidogrel (PLAVIX) 75 MG tablet Take 75 mg by mouth daily with breakfast.      . diltiazem (MATZIM LA) 240 MG 24 hr tablet Take 240 mg by mouth daily.      Marland Kitchen docusate sodium (COLACE) 100 MG capsule Take 100 mg  by mouth at bedtime.      Marland Kitchen doxazosin (CARDURA) 2 MG tablet Take 2 mg by mouth daily.      . isosorbide mononitrate (IMDUR) 60 MG 24 hr tablet Take 90 mg by mouth daily.       Marland Kitchen levETIRAcetam (KEPPRA) 500 MG tablet TAKE 1 TABLET BY MOUTH TWICE A DAY  60 tablet  5  . levothyroxine (SYNTHROID, LEVOTHROID) 100 MCG tablet Take 100 mcg by mouth daily before breakfast.       . losartan (COZAAR) 100 MG tablet Take 100 mg by mouth daily.      . metoprolol succinate (TOPROL-XL) 25 MG 24 hr tablet Take 25 mg by mouth daily.       . nitroGLYCERIN (NITROSTAT) 0.4 MG SL tablet Place 0.4 mg under the tongue every 5 (five) minutes as needed for chest pain.      . pantoprazole (PROTONIX) 40 MG tablet Take 1 tablet (40 mg total) by mouth 2 (two) times daily.  60 tablet  11  . PARoxetine (PAXIL) 30 MG tablet Take 30 mg by mouth daily.       . potassium chloride SA (K-DUR,KLOR-CON) 20 MEQ tablet Take 20 mEq by  mouth 2 (two) times daily.      . rosuvastatin (CRESTOR) 10 MG tablet Take 10 mg by mouth every morning.        No current facility-administered medications for this visit.    Allergies:   No Known Allergies  Social History:  The patient  reports that she quit smoking about 45 years ago. Her smoking use included Cigarettes. She started smoking about 48 years ago. She has a .9 pack-year smoking history. She has never used smokeless tobacco. She reports that she does not drink alcohol or use illicit drugs.   Family History:  The patient's family history includes Breast cancer in her sister and another family member; Heart attack in her mother; Heart disease in her father. There is no history of Colon cancer.   ROS:  Please see the history of present illness.      All other systems reviewed and negative.   PHYSICAL EXAM: VS:  BP 118/68  Pulse 58  Ht 4\' 11"  (1.499 m)  Wt 143 lb (64.864 kg)  BMI 28.87 kg/m2 Well nourished, well developed, in no acute distress HEENT: normal Neck: no JVD Cardiac:  normal S1, S2; RRR; 1/6 systolic murmur at RUSB Lungs:  clear to auscultation bilaterally, no wheezing, rhonchi or rales Abd: soft, nontender, no hepatomegaly Ext: no edema Skin: warm and dry Neuro:  CNs 2-12 intact, no focal abnormalities noted       ASSESSMENT AND PLAN:  1. ASCAD with recent nuclear stress test with no ischemia.  She has not had any angina since removal of her pericardial cyst. - continue ASA/Plavix/Imdur/metoprolol  2. HTN - well controlled - continue metoprolol/Losartan/doxazosin/diltiazem  3. Dyslipidemia - LDL at goal   - continue crestor  4. Chronic noncardiac CP secondary to pericardial cyst no resolved after resection 5. GERD with esophageal stricture   Followup with me in 6 months  Signed, Fransico Him, MD 05/24/2013 10:40 AM

## 2013-05-30 DIAGNOSIS — L819 Disorder of pigmentation, unspecified: Secondary | ICD-10-CM | POA: Diagnosis not present

## 2013-05-30 DIAGNOSIS — L905 Scar conditions and fibrosis of skin: Secondary | ICD-10-CM | POA: Diagnosis not present

## 2013-05-30 DIAGNOSIS — D239 Other benign neoplasm of skin, unspecified: Secondary | ICD-10-CM | POA: Diagnosis not present

## 2013-05-30 DIAGNOSIS — D235 Other benign neoplasm of skin of trunk: Secondary | ICD-10-CM | POA: Diagnosis not present

## 2013-06-06 DIAGNOSIS — M5137 Other intervertebral disc degeneration, lumbosacral region: Secondary | ICD-10-CM | POA: Diagnosis not present

## 2013-06-06 DIAGNOSIS — M5030 Other cervical disc degeneration, unspecified cervical region: Secondary | ICD-10-CM | POA: Diagnosis not present

## 2013-06-06 DIAGNOSIS — Z79899 Other long term (current) drug therapy: Secondary | ICD-10-CM | POA: Diagnosis not present

## 2013-06-07 ENCOUNTER — Telehealth: Payer: Self-pay | Admitting: *Deleted

## 2013-06-07 ENCOUNTER — Other Ambulatory Visit: Payer: Self-pay | Admitting: *Deleted

## 2013-06-07 MED ORDER — METOPROLOL SUCCINATE ER 50 MG PO TB24: 50.0000 mg | ORAL_TABLET | Freq: Every day | ORAL | Status: DC

## 2013-06-07 NOTE — Telephone Encounter (Signed)
Spoke to patient and she stated that her metoprolol dose is 50mg . I will send in.

## 2013-06-07 NOTE — Telephone Encounter (Signed)
[facility] on [address] requests metoprolol 50mg  for patient, but the office note has metoprolol 25mg . Which one should the patient be on? Please advise. Thanks, MI

## 2013-06-08 NOTE — Telephone Encounter (Signed)
Spoke with pts husband and pts wife called yesterday and stated she has been taking 50 mg of Metoprolol all along. Medication was refilled yesterday by the refill team. FYI to Dr. Radford Pax.

## 2013-06-11 DIAGNOSIS — I10 Essential (primary) hypertension: Secondary | ICD-10-CM | POA: Diagnosis not present

## 2013-06-11 DIAGNOSIS — IMO0001 Reserved for inherently not codable concepts without codable children: Secondary | ICD-10-CM | POA: Diagnosis not present

## 2013-06-11 DIAGNOSIS — E209 Hypoparathyroidism, unspecified: Secondary | ICD-10-CM | POA: Diagnosis not present

## 2013-06-11 DIAGNOSIS — N182 Chronic kidney disease, stage 2 (mild): Secondary | ICD-10-CM | POA: Diagnosis not present

## 2013-06-11 DIAGNOSIS — E039 Hypothyroidism, unspecified: Secondary | ICD-10-CM | POA: Diagnosis not present

## 2013-06-15 ENCOUNTER — Telehealth: Payer: Self-pay | Admitting: Cardiology

## 2013-06-15 NOTE — Telephone Encounter (Signed)
[name] that Dr. Radford Pax states she can hold Plavix prior to procedure.  Will fax note to her at [phone]

## 2013-06-15 NOTE — Telephone Encounter (Signed)
Estill Bamberg from [facility] is calling wanting to know if pt can stop Plavix 5 days prior to procedure.  She is scheduled for a cervical lumbar epidural steroid injection on 6/24.  Advised would forward message to Dr. Radford Pax for advise.

## 2013-06-15 NOTE — Telephone Encounter (Signed)
New message     Need clearance to stop plavix for 5 days for back/neck injection.  Fax clearance to [phone]

## 2013-06-15 NOTE — Telephone Encounter (Signed)
Her PCI was >12 months ago so ok to hold Plavix for procedure

## 2013-06-26 ENCOUNTER — Other Ambulatory Visit: Payer: Self-pay

## 2013-06-26 ENCOUNTER — Encounter: Payer: Self-pay | Admitting: *Deleted

## 2013-06-26 MED ORDER — ISOSORBIDE MONONITRATE ER 60 MG PO TB24: 90.0000 mg | ORAL_TABLET | Freq: Every day | ORAL | Status: DC

## 2013-06-27 ENCOUNTER — Other Ambulatory Visit: Payer: Self-pay

## 2013-06-27 MED ORDER — DOXAZOSIN MESYLATE 2 MG PO TABS: 2.0000 mg | ORAL_TABLET | Freq: Every day | ORAL | Status: DC

## 2013-07-04 DIAGNOSIS — M542 Cervicalgia: Secondary | ICD-10-CM | POA: Diagnosis not present

## 2013-07-04 DIAGNOSIS — M545 Low back pain, unspecified: Secondary | ICD-10-CM | POA: Diagnosis not present

## 2013-07-12 ENCOUNTER — Ambulatory Visit: Payer: BC Managed Care – PPO | Admitting: Neurology

## 2013-07-17 DIAGNOSIS — IMO0001 Reserved for inherently not codable concepts without codable children: Secondary | ICD-10-CM | POA: Diagnosis not present

## 2013-07-26 ENCOUNTER — Ambulatory Visit (INDEPENDENT_AMBULATORY_CARE_PROVIDER_SITE_OTHER): Payer: Medicare Other | Admitting: Neurology

## 2013-07-26 ENCOUNTER — Encounter: Payer: Self-pay | Admitting: Neurology

## 2013-07-26 ENCOUNTER — Encounter (INDEPENDENT_AMBULATORY_CARE_PROVIDER_SITE_OTHER): Payer: Self-pay

## 2013-07-26 VITALS — BP 142/81 | HR 54 | Wt 143.0 lb

## 2013-07-26 DIAGNOSIS — R569 Unspecified convulsions: Secondary | ICD-10-CM | POA: Insufficient documentation

## 2013-07-26 DIAGNOSIS — I2510 Atherosclerotic heart disease of native coronary artery without angina pectoris: Secondary | ICD-10-CM

## 2013-07-26 DIAGNOSIS — Z8669 Personal history of other diseases of the nervous system and sense organs: Secondary | ICD-10-CM | POA: Diagnosis not present

## 2013-07-26 MED ORDER — LEVETIRACETAM 500 MG PO TABS: 500.0000 mg | ORAL_TABLET | Freq: Two times a day (BID) | ORAL | Status: DC

## 2013-07-26 NOTE — Patient Instructions (Signed)

## 2013-07-26 NOTE — Progress Notes (Signed)
Reason for visit:  Seizures  [name] is an 68 y.o. female  History of present illness:  [name] is a 68 year old left-handed white female with a history of seizures. The patient has done quite well with her seizures, and these events tend to be nocturnal in nature. She indicates that it has been several years since her last seizure. She does operate a Teacher, music. She has been followed by Dr. Nelva Bush for neck and low back pain, and she has received some injections. She is also had an injection in the right knee. She is on Keppra, and she is tolerating the medication well. She indicates that her headaches are doing fairly well, she is having about one headache a month. She returns for an evaluation. She recently had a mediastinal cyst resection. The patient has chronic right arm weakness.  Past Medical History  Diagnosis Date  . Irritable bowel syndrome   . Esophageal reflux   . Dysphagia, unspecified(787.20)   . Hypertension   . Depression   . Hypothyroidism   . Seizures   . Iron deficiency anemia   . Internal hemorrhoids   . Coronary artery disease     coronary angiogram on 03/14/12 showed Normal left main coronary artery, 50% proximal and 70% mid stenosis of the LAD and distally at takeoff of second diagonal there is a 50-70% stenosis in a very tortuous LAD, 70-80% ostial stenosis of the left circumflex in a small vessel, and 50% instent restenosis of the mid to distal portion of the RCA stent and 50-70% stenosis of the ostial PDA.   Marland Kitchen Anxiety   . Arthritis   . Blood transfusion 1960  . Heart murmur   . Hyperlipidemia   . Myocardial infarction 11/2009    1 stent placed  . Esophageal stricture   . Hiatal hernia   . Shortness of breath   . Complication of anesthesia     woke up durin surgery x 2  . PONV (postoperative nausea and vomiting)   . Pericardial cyst     benign, s/p resection  . Convulsions/seizures 07/26/2013  . Vitamin D deficiency   . GERD  (gastroesophageal reflux disease)   . Gout   . Cervical spondylosis without myelopathy   . Chronic low back pain   . Obesity   . Carpal tunnel syndrome     Past Surgical History  Procedure Laterality Date  . Breast surgery  2010     benign cysts  . Coronary angioplasty with stent placement  2011 and 2012    drug-eluting stent w/ ion stents to the distal right coronary artery  . Colonoscopy  2013 or 2014  . Mediasternotomy N/A 02/05/2013    Procedure: MEDIAN STERNOTOMY;  Surgeon: Ivin Poot, MD;  Location: [facility];  Service: Thoracic;  Laterality: N/A;  . Biopsy of mediastinal mass N/A 02/05/2013    Procedure: RESECTION OF MEDIASTINAL MASS;  Surgeon: Ivin Poot, MD;  Location: [facility] OR;  Service: Thoracic;  Laterality: N/A;  . Ectopic pregnancy surgery    . Esophageal dilation      Esophageal stricture    Family History  Problem Relation Age of Onset  . Breast cancer Sister   . Cancer Sister     breast  . Breast cancer      niece  . Heart disease Father   . Hypertension Father   . Emphysema Father   . Coronary artery disease Father   . Colon cancer Neg Hx   .  Heart attack Mother   . CVA Mother     Social history:  reports that she quit smoking about 45 years ago. Her smoking use included Cigarettes. She started smoking about 48 years ago. She has a .9 pack-year smoking history. She has never used smokeless tobacco. She reports that she does not drink alcohol or use illicit drugs.   No Known Allergies  Medications:  Current Outpatient Prescriptions on File Prior to Visit  Medication Sig Dispense Refill  . ALPRAZolam (XANAX) 0.25 MG tablet Take 0.25 mg by mouth at bedtime.       Marland Kitchen aspirin EC 81 MG tablet Take 81 mg by mouth daily.      . calcitRIOL (ROCALTROL) 0.5 MCG capsule Take 0.5 mcg by mouth daily.       . Calcium Carbonate-Vitamin D (CALCIUM + D PO) Take 1 tablet by mouth daily.      . clopidogrel (PLAVIX) 75 MG tablet Take 75 mg by mouth daily with  breakfast.      . diltiazem (MATZIM LA) 240 MG 24 hr tablet Take 240 mg by mouth daily.      Marland Kitchen doxazosin (CARDURA) 2 MG tablet Take 1 tablet (2 mg total) by mouth daily.  30 tablet  6  . isosorbide mononitrate (IMDUR) 60 MG 24 hr tablet Take 1.5 tablets (90 mg total) by mouth daily.  45 tablet  6  . levothyroxine (SYNTHROID, LEVOTHROID) 100 MCG tablet Take 100 mcg by mouth daily before breakfast.       . losartan (COZAAR) 100 MG tablet Take 100 mg by mouth daily.      . metoprolol succinate (TOPROL-XL) 50 MG 24 hr tablet Take 1 tablet (50 mg total) by mouth daily. Take with or immediately following a meal.  30 tablet  5  . nitroGLYCERIN (NITROSTAT) 0.4 MG SL tablet Place 0.4 mg under the tongue every 5 (five) minutes as needed for chest pain.      . pantoprazole (PROTONIX) 40 MG tablet Take 1 tablet (40 mg total) by mouth 2 (two) times daily.  60 tablet  11  . PARoxetine (PAXIL) 30 MG tablet Take 30 mg by mouth daily.       . potassium chloride SA (K-DUR,KLOR-CON) 20 MEQ tablet Take 20 mEq by mouth 2 (two) times daily.      . rosuvastatin (CRESTOR) 10 MG tablet Take 10 mg by mouth every morning.        No current facility-administered medications on file prior to visit.    ROS:  Out of a complete 14 system review of symptoms, the patient complains only of the following symptoms, and all other reviewed systems are negative.  Snoring Moles  Blood pressure 142/81, pulse 54, weight 143 lb (64.864 kg).  Physical Exam  General: The patient is alert and cooperative at the time of the examination. The patient is moderately obese.  Skin: No significant peripheral edema is noted.   Neurologic Exam  Mental status: The patient is oriented x 3.  Cranial nerves: Facial symmetry is present. Speech is normal, no aphasia or dysarthria is noted. Extraocular movements are full. Visual fields are full.  Motor: The patient has good strength in all 4 extremities, with exception that there is some  weakness with grip and intrinsic muscles with the right hand..  Sensory examination: Soft touch sensation is symmetric on the face and the legs, slightly decreased on the right arm as compared to the left.  Coordination: The patient has good finger-nose-finger  and heel-to-shin bilaterally.  Gait and station: The patient has a normal gait. Tandem gait is slightly unsteady. Romberg is negative. No drift is seen.  Reflexes: Deep tendon reflexes are symmetric.    MRI cervical 12/14/11:  Impression: Abnormal MRI cervical spine (without contrast) demonstrating: 1. At C5-6: Disc bulging with facet hypertrophy and mild spinal stenosis and severe biforaminal stenosis. 2. At C3-4: Disc bulging with facet hypertrophy with severe right and moderate left foraminal stenosis. 3. At C2-3: Disc bulging with moderate right foraminal stenosis.   Assessment/Plan:  1. History of seizures, good control  2. History of headache  3. Cervical spondylosis  4. Chronic low back pain  The patient doing relatively well at this time with her seizures. She will continue the Keppra this time, and a prescription was written. She will followup in one year. She will contact our office if any neurologic issues arise.  Jill Alexanders MD 07/26/2013 2:09 PM  [facility] [address] [place], [place] [address]  Phone [phone] Fax [phone]

## 2013-08-07 DIAGNOSIS — N183 Chronic kidney disease, stage 3 unspecified: Secondary | ICD-10-CM | POA: Diagnosis not present

## 2013-08-07 DIAGNOSIS — I129 Hypertensive chronic kidney disease with stage 1 through stage 4 chronic kidney disease, or unspecified chronic kidney disease: Secondary | ICD-10-CM | POA: Diagnosis not present

## 2013-08-07 DIAGNOSIS — R82998 Other abnormal findings in urine: Secondary | ICD-10-CM | POA: Diagnosis not present

## 2013-08-07 DIAGNOSIS — R3129 Other microscopic hematuria: Secondary | ICD-10-CM | POA: Diagnosis not present

## 2013-08-07 DIAGNOSIS — R809 Proteinuria, unspecified: Secondary | ICD-10-CM | POA: Diagnosis not present

## 2013-09-06 ENCOUNTER — Other Ambulatory Visit: Payer: Medicare Other

## 2013-09-11 DIAGNOSIS — Z23 Encounter for immunization: Secondary | ICD-10-CM | POA: Diagnosis not present

## 2013-09-18 DIAGNOSIS — M1710 Unilateral primary osteoarthritis, unspecified knee: Secondary | ICD-10-CM | POA: Diagnosis not present

## 2013-09-19 ENCOUNTER — Other Ambulatory Visit: Payer: Self-pay

## 2013-09-19 MED ORDER — CLOPIDOGREL BISULFATE 75 MG PO TABS: 75.0000 mg | ORAL_TABLET | Freq: Every day | ORAL | Status: DC

## 2013-10-18 ENCOUNTER — Ambulatory Visit: Payer: BC Managed Care – PPO | Admitting: Neurology

## 2013-11-16 ENCOUNTER — Encounter: Payer: Self-pay | Admitting: Internal Medicine

## 2013-11-16 ENCOUNTER — Ambulatory Visit (INDEPENDENT_AMBULATORY_CARE_PROVIDER_SITE_OTHER): Payer: Medicare Other | Admitting: Internal Medicine

## 2013-11-16 VITALS — BP 110/62 | HR 64 | Ht 59.0 in | Wt 141.2 lb

## 2013-11-16 DIAGNOSIS — K5900 Constipation, unspecified: Secondary | ICD-10-CM

## 2013-11-16 DIAGNOSIS — K2100 Gastro-esophageal reflux disease with esophagitis, without bleeding: Secondary | ICD-10-CM

## 2013-11-16 DIAGNOSIS — R1031 Right lower quadrant pain: Secondary | ICD-10-CM

## 2013-11-16 DIAGNOSIS — I2510 Atherosclerotic heart disease of native coronary artery without angina pectoris: Secondary | ICD-10-CM | POA: Diagnosis not present

## 2013-11-16 NOTE — Patient Instructions (Signed)
Use Miralax daily, adjust as needed  You have been scheduled for a CT scan of the abdomen and pelvis at [facility] ([address]---this is in the same building as Press [facility]).   You are scheduled on 11/20/2013 at 8:30am. You should arrive 15 minutes prior to your appointment time for registration. Please follow the written instructions below on the day of your exam:  WARNING: IF YOU ARE ALLERGIC TO IODINE/X-RAY DYE, PLEASE NOTIFY RADIOLOGY IMMEDIATELY AT [phone]! YOU WILL BE GIVEN A 13 HOUR PREMEDICATION PREP.  1) Do not eat or drink anything after 4:30am (4 hours prior to your test) 2) You have been given 2 bottles of oral contrast to drink. The solution may taste better if refrigerated, but do NOT add ice or any other liquid to this solution. Shake well before drinking.    Drink 1 bottle of contrast @ 6:30am(2 hours prior to your exam)  Drink 1 bottle of contrast @ 7:30am (1 hour prior to your exam)  You may take any medications as prescribed with a small amount of water except for the following: Metformin, Glucophage, Glucovance, Avandamet, Riomet, Fortamet, Actoplus Met, Janumet, Glumetza or Metaglip. The above medications must be held the day of the exam AND 48 hours after the exam.  The purpose of you drinking the oral contrast is to aid in the visualization of your intestinal tract. The contrast solution may cause some diarrhea. Before your exam is started, you will be given a small amount of fluid to drink. Depending on your individual set of symptoms, you may also receive an intravenous injection of x-ray contrast/dye. Plan on being at [facility] for 30 minutes or long, depending on the type of exam you are having performed.  If you have any questions regarding your exam or if you need to reschedule, you may call the CT department at [phone] between the hours of 8:00 am and 5:00 pm,  Monday-Friday.  ________________________________________________________________________

## 2013-11-16 NOTE — Progress Notes (Signed)
HISTORY OF PRESENT ILLNESS:  [name] is a 68 y.o. female with MULTIPLE SIGNIFICANT medical problems as listed below. She has a history of GERD, chronic dysphagia secondary to a combination of stricture and dysmotility, and fluctuating bowel habits. She presents today, accompanied by her husband, regarding to symptoms. First, postprandial hiccups which last a minute or so. No other associated symptoms such as heartburn, globus sensation, dysphagia, or regurgitation. She is status post resection of mediastinal mass (anterior pericardial cyst) since her last visit. She continues on pantoprazole 40 mg twice daily. Next, she complains of 11 month history of right-sided abdominal discomfort. She states this began after her thoracic surgery. She did however complain of right-sided abdominal pain last fall for which I have her submit to abdominal ultrasound which was essentially normal. Incidental gallbladder polyps noted. She last underwent upper endoscopy with esophageal dilation in May 2013. Most recent colonoscopy in December 2011 was unremarkable. In terms of the right-sided pain, it is worse with direct pressure. No exacerbation postprandially. She does report an associated change in her bowel habits, constipation specifically. There has been no fevers or weight loss.  REVIEW OF SYSTEMS:  All non-GI ROS negative except for hearing problems, depression, fatigue,  Past Medical History  Diagnosis Date  . Irritable bowel syndrome   . Esophageal reflux   . Dysphagia, unspecified(787.20)   . Hypertension   . Depression   . Hypothyroidism   . Seizures   . Iron deficiency anemia   . Internal hemorrhoids   . Coronary artery disease     coronary angiogram on 03/14/12 showed Normal left main coronary artery, 50% proximal and 70% mid stenosis of the LAD and distally at takeoff of second diagonal there is a 50-70% stenosis in a very tortuous LAD, 70-80% ostial stenosis of the left circumflex in a small  vessel, and 50% instent restenosis of the mid to distal portion of the RCA stent and 50-70% stenosis of the ostial PDA.   Marland Kitchen Anxiety   . Arthritis   . Blood transfusion 1960  . Heart murmur   . Hyperlipidemia   . Myocardial infarction 11/2009    1 stent placed  . Esophageal stricture   . Hiatal hernia   . Shortness of breath   . Complication of anesthesia     woke up durin surgery x 2  . PONV (postoperative nausea and vomiting)   . Pericardial cyst     benign, s/p resection  . Convulsions/seizures 07/26/2013  . Vitamin D deficiency   . GERD (gastroesophageal reflux disease)   . Gout   . Cervical spondylosis without myelopathy   . Chronic low back pain   . Obesity   . Carpal tunnel syndrome     Past Surgical History  Procedure Laterality Date  . Breast surgery  2010     benign cysts  . Coronary angioplasty with stent placement  2011 and 2012    drug-eluting stent w/ ion stents to the distal right coronary artery  . Colonoscopy  2013 or 2014  . Mediasternotomy N/A 02/05/2013    Procedure: MEDIAN STERNOTOMY;  Surgeon: Ivin Poot, MD;  Location: [facility];  Service: Thoracic;  Laterality: N/A;  . Biopsy of mediastinal mass N/A 02/05/2013    Procedure: RESECTION OF MEDIASTINAL MASS;  Surgeon: Ivin Poot, MD;  Location: [facility] OR;  Service: Thoracic;  Laterality: N/A;  . Ectopic pregnancy surgery    . Esophageal dilation      Esophageal stricture  Social History [name]  reports that she quit smoking about 46 years ago. Her smoking use included Cigarettes. She started smoking about 49 years ago. She has a .9 pack-year smoking history. She has never used smokeless tobacco. She reports that she does not drink alcohol or use illicit drugs.  family history includes Breast cancer in her sister and another family member; CVA in her mother; Cancer in her sister; Coronary artery disease in her father; Emphysema in her father; Heart attack in her mother; Heart disease in her  father; Hypertension in her father. There is no history of Colon cancer.  No Known Allergies     PHYSICAL EXAMINATION: Vital signs: BP 110/62 mmHg  Pulse 64  Ht 4\' 11"  (1.499 m)  Wt 141 lb 3.2 oz (64.048 kg)  BMI 28.50 kg/m2 General: Well-developed, well-nourished, no acute distress. Facial tremor HEENT: Sclerae are anicteric, conjunctiva pink. Oral mucosa intact. No thrush Lungs: Clear Heart: Regular Abdomen: soft, mild tenderness in the right mid abdomen with palpation but no obvious mass, nondistended, no obvious ascites, no peritoneal signs, normal bowel sounds. No organomegaly. Extremities: No edema Psychiatric: alert and oriented x3. Cooperative    ASSESSMENT:  #1. GERD. Classic symptoms controlled with twice a day PPI. #2. Hiccups. Minor. Does not need further assessment #3. Dysphagia. Combination of stricture and dysmotility. Last endoscopy with dilation May 2013. No significant problems currently #4. Right-sided abdominal pain. New complaint. Possibly musculoskeletal. May be related to constipation. #5. New-onset constipation this year #6. Colon cancer screening. Up-to-date. Last exam 2011 #7. Multiple medical problems   PLAN:  #1. Continue twice a day PPI #2. Esophageal dilation only for significant solid food dysphagia #3. MiraLAX 17 g daily in 8 ounces of water. Titrate to need as instructed by me #4. Contrast-enhanced CT scan of the abdomen and pelvis to further evaluate ongoing right-sided pain. We will contact the patient with results and recommendations #5. Screening colonoscopy 2021 #6. Resume general medical care with primary provider #7. Routine GI follow-up in 1 year. Sooner as needed

## 2013-11-20 ENCOUNTER — Other Ambulatory Visit: Payer: Self-pay

## 2013-11-20 ENCOUNTER — Other Ambulatory Visit (INDEPENDENT_AMBULATORY_CARE_PROVIDER_SITE_OTHER): Payer: Medicare Other

## 2013-11-20 ENCOUNTER — Ambulatory Visit (INDEPENDENT_AMBULATORY_CARE_PROVIDER_SITE_OTHER): Payer: Medicare Other | Admitting: Cardiology

## 2013-11-20 ENCOUNTER — Ambulatory Visit (INDEPENDENT_AMBULATORY_CARE_PROVIDER_SITE_OTHER)
Admission: RE | Admit: 2013-11-20 | Discharge: 2013-11-20 | Disposition: A | Discharge status: Home / Self Care | Payer: Medicare Other | Source: Ambulatory Visit | Attending: Internal Medicine | Admitting: Internal Medicine

## 2013-11-20 ENCOUNTER — Encounter: Payer: Self-pay | Admitting: Cardiology

## 2013-11-20 VITALS — BP 140/74 | HR 52 | Ht 59.0 in | Wt 142.1 lb

## 2013-11-20 DIAGNOSIS — I10 Essential (primary) hypertension: Secondary | ICD-10-CM

## 2013-11-20 DIAGNOSIS — R1084 Generalized abdominal pain: Secondary | ICD-10-CM | POA: Diagnosis not present

## 2013-11-20 DIAGNOSIS — I2510 Atherosclerotic heart disease of native coronary artery without angina pectoris: Secondary | ICD-10-CM | POA: Diagnosis not present

## 2013-11-20 DIAGNOSIS — K5900 Constipation, unspecified: Secondary | ICD-10-CM | POA: Diagnosis not present

## 2013-11-20 DIAGNOSIS — R1031 Right lower quadrant pain: Secondary | ICD-10-CM | POA: Diagnosis not present

## 2013-11-20 DIAGNOSIS — E785 Hyperlipidemia, unspecified: Secondary | ICD-10-CM

## 2013-11-20 DIAGNOSIS — R079 Chest pain, unspecified: Secondary | ICD-10-CM | POA: Diagnosis not present

## 2013-11-20 DIAGNOSIS — D259 Leiomyoma of uterus, unspecified: Secondary | ICD-10-CM | POA: Diagnosis not present

## 2013-11-20 DIAGNOSIS — K219 Gastro-esophageal reflux disease without esophagitis: Secondary | ICD-10-CM | POA: Diagnosis not present

## 2013-11-20 DIAGNOSIS — K2100 Gastro-esophageal reflux disease with esophagitis, without bleeding: Secondary | ICD-10-CM

## 2013-11-20 LAB — BASIC METABOLIC PANEL
BUN: 21 mg/dL (ref 6–23)
CHLORIDE: 106 meq/L (ref 96–112)
CO2: 25 meq/L (ref 19–32)
Calcium: 11 mg/dL — ABNORMAL HIGH (ref 8.4–10.5)
Creatinine, Ser: 1.3 mg/dL — ABNORMAL HIGH (ref 0.4–1.2)
GFR: 41.87 mL/min — ABNORMAL LOW (ref >60.00)
GLUCOSE: 109 mg/dL — AB (ref 70–99)
POTASSIUM: 4.3 meq/L (ref 3.5–5.1)
Sodium: 144 mEq/L (ref 135–145)

## 2013-11-20 MED ORDER — ROSUVASTATIN CALCIUM 10 MG PO TABS: 10.0000 mg | ORAL_TABLET | Freq: Every morning | ORAL | Status: DC

## 2013-11-20 MED ORDER — IOHEXOL 300 MG/ML  SOLN
100.0000 mL | Freq: Once | INTRAMUSCULAR | Status: AC | PRN
  Administered 2013-11-20: 100 mL via INTRAVENOUS

## 2013-11-20 NOTE — Progress Notes (Signed)
[place], [place], [place]  [address] Phone: [phone] Fax:  [phone]  Date:  11/20/2013   ID:  [name], DOB 1945-05-22, MRN [id number]  PCP:  Reginia Naas, MD  Cardiologist:  Fransico Him, MD    History of Present Illness: [name] is a 68 y.o. female with a history of CAD/HTN and lipids who presents today for followup. She was recently in the hospital for atypical CP and had a normal nuclear stress test. She underwent Gi eval which was negative for gallstones and she underwent esophageal dilatation but returned to the ER with recurrent CP and was found to have a 6cm mediastinal mass in front of the ascending aorta. She was seen by Dr. 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  She underwent median sternotomy with excision of anterior mediastinal anterior pericardial cyst on 02/05/2013. She is doing well.  She occasionally has some mild Chest pain that is sharp that is midsternal and does not radiate.  Nuclear stress test 10/2012  showed no ischemia.  She has some SOB going up stairs or hills but this is stable. She has started back at the gym slowly. She denies any LE edema, dizziness, palpitations.    Wt Readings from Last 3 Encounters:  11/20/13 142 lb 1.9 oz (64.465 kg)  11/16/13 141 lb 3.2 oz (64.048 kg)  07/26/13 143 lb (64.864 kg)     Past Medical History  Diagnosis Date  . Irritable bowel syndrome   . Esophageal reflux   . Dysphagia, unspecified(787.20)   . Hypertension   . Depression   . Hypothyroidism   . Seizures   . Iron deficiency anemia   . Internal hemorrhoids   . Coronary artery disease     coronary angiogram on 03/14/12 showed Normal left main coronary artery, 50% proximal and 70% mid stenosis of the LAD and distally at takeoff of second diagonal there is a 50-70% stenosis in a very tortuous LAD, 70-80% ostial stenosis of the left circumflex in a small vessel, and 50% instent restenosis of  the mid to distal portion of the RCA stent and 50-70% stenosis of the ostial PDA.   Marland Kitchen Anxiety   . Arthritis   . Blood transfusion 1960  . Heart murmur   . Hyperlipidemia   . Myocardial infarction 11/2009    1 stent placed  . Esophageal stricture   . Hiatal hernia   . Shortness of breath   . Complication of anesthesia     woke up durin surgery x 2  . PONV (postoperative nausea and vomiting)   . Pericardial cyst     benign, s/p resection  . Convulsions/seizures 07/26/2013  . Vitamin D deficiency   . GERD (gastroesophageal reflux disease)   . Gout   . Cervical spondylosis without myelopathy   . Chronic low back pain   . Obesity   . Carpal tunnel syndrome     Current Outpatient Prescriptions  Medication Sig Dispense Refill  . ALPRAZolam (XANAX) 0.25 MG tablet Take 0.25 mg by mouth at bedtime.     Marland Kitchen aspirin EC 81 MG tablet Take 81 mg by mouth daily.    . calcitRIOL (ROCALTROL) 0.5 MCG capsule Take 0.5 mcg by mouth daily.     . Calcium Carbonate-Vitamin D (CALCIUM + D PO) Take 1 tablet by mouth daily.    . clopidogrel (PLAVIX) 75 MG tablet Take 1 tablet (75 mg total) by mouth daily  with breakfast. 30 tablet 4  . diltiazem (MATZIM LA) 240 MG 24 hr tablet Take 240 mg by mouth daily.    Marland Kitchen doxazosin (CARDURA) 2 MG tablet Take 1 tablet (2 mg total) by mouth daily. 30 tablet 6  . HYDROcodone-acetaminophen (NORCO) 10-325 MG per tablet Take 1 tablet by mouth as needed.    . isosorbide mononitrate (IMDUR) 60 MG 24 hr tablet Take 1.5 tablets (90 mg total) by mouth daily. 45 tablet 6  . levETIRAcetam (KEPPRA) 500 MG tablet Take 1 tablet (500 mg total) by mouth 2 (two) times daily. 60 tablet 11  . levothyroxine (SYNTHROID, LEVOTHROID) 100 MCG tablet Take 100 mcg by mouth daily before breakfast.     . losartan (COZAAR) 100 MG tablet Take 100 mg by mouth daily.    . metoprolol succinate (TOPROL-XL) 50 MG 24 hr tablet Take 1 tablet (50 mg total) by mouth daily. Take with or immediately following a  meal. 30 tablet 5  . nitroGLYCERIN (NITROSTAT) 0.4 MG SL tablet Place 0.4 mg under the tongue every 5 (five) minutes as needed for chest pain.    . pantoprazole (PROTONIX) 40 MG tablet Take 1 tablet (40 mg total) by mouth 2 (two) times daily. 60 tablet 11  . PARoxetine (PAXIL) 30 MG tablet Take 30 mg by mouth daily.    . potassium chloride SA (K-DUR,KLOR-CON) 20 MEQ tablet Take 20 mEq by mouth 2 (two) times daily.    . rosuvastatin (CRESTOR) 10 MG tablet Take 10 mg by mouth every morning.      No current facility-administered medications for this visit.    Allergies:   No Known Allergies  Social History:  The patient  reports that she quit smoking about 46 years ago. Her smoking use included Cigarettes. She started smoking about 49 years ago. She has a .9 pack-year smoking history. She has never used smokeless tobacco. She reports that she does not drink alcohol or use illicit drugs.   Family History:  The patient's family history includes Breast cancer in her sister and another family member; CVA in her mother; Cancer in her sister; Coronary artery disease in her father; Emphysema in her father; Heart attack in her mother; Heart disease in her father; Hypertension in her father. There is no history of Colon cancer.   ROS:  Please see the history of present illness.      All other systems reviewed and negative.   PHYSICAL EXAM: VS:  BP 140/74 mmHg  Pulse 52  Ht 4\' 11"  (1.499 m)  Wt 142 lb 1.9 oz (64.465 kg)  BMI 28.69 kg/m2 Well nourished, well developed, in no acute distress HEENT: normal Neck: no JVD Cardiac:  normal S1, S2; RRR; no murmur Lungs:  clear to auscultation bilaterally, no wheezing, rhonchi or rales Abd: soft, nontender, no hepatomegaly Ext: no edema Skin: warm and dry Neuro:  CNs 2-12 intact, no focal abnormalities noted   ASSESSMENT AND PLAN:  1. ASCAD - now with CP that is not like her prior GI pain or pain from her pericardial cyst - continue  ASA/Plavix/Imdur/metoprolol  2. HTN - well controlled - continue metoprolol/Losartan/doxazosin/diltiazem  3. Dyslipidemia - LDL at goal  - continue crestor  - check FLP and ALT 4. Chronic noncardiac CP secondary to pericardial cyst no resolved after resection.  She is now having a different type of CP that is different from her GERD and different from the pain she had with the pericardial cyst.  She has moderate CAD  with last nuclear stress test a year ago so I will get a nuclear stress test to rule out ischemia.   5. GERD with esophageal stricture - per GI  Followup with me in 6 months   Signed, Fransico Him, MD [facility] 11/20/2013 9:24 AM

## 2013-11-20 NOTE — Patient Instructions (Signed)
Your physician recommends that you return for lab work on Thursday, Nov. 12. (FASTING labs).  Your physician has requested that you have a lexiscan myoview. For further information please visit [email or web address]. Please follow instruction sheet, as given.  Your physician recommends that you continue on your current medications as directed. Please refer to the Current Medication list given to you today.  Your physician wants you to follow-up in: 6 months. You will receive a reminder letter in the mail two months in advance. If you don't receive a letter, please call our office to schedule the follow-up appointment.

## 2013-11-21 ENCOUNTER — Other Ambulatory Visit: Payer: Self-pay

## 2013-11-21 DIAGNOSIS — R918 Other nonspecific abnormal finding of lung field: Secondary | ICD-10-CM

## 2013-11-22 ENCOUNTER — Ambulatory Visit (INDEPENDENT_AMBULATORY_CARE_PROVIDER_SITE_OTHER)
Admission: RE | Admit: 2013-11-22 | Discharge: 2013-11-22 | Disposition: A | Discharge status: Home / Self Care | Payer: Medicare Other | Source: Ambulatory Visit | Attending: Internal Medicine | Admitting: Internal Medicine

## 2013-11-22 ENCOUNTER — Other Ambulatory Visit (INDEPENDENT_AMBULATORY_CARE_PROVIDER_SITE_OTHER): Payer: Medicare Other | Admitting: *Deleted

## 2013-11-22 ENCOUNTER — Ambulatory Visit (HOSPITAL_COMMUNITY): Payer: Medicare Other | Attending: Cardiology | Admitting: Radiology

## 2013-11-22 VITALS — BP 134/79 | HR 64 | Ht 59.0 in | Wt 136.0 lb

## 2013-11-22 DIAGNOSIS — R911 Solitary pulmonary nodule: Secondary | ICD-10-CM | POA: Diagnosis not present

## 2013-11-22 DIAGNOSIS — R079 Chest pain, unspecified: Secondary | ICD-10-CM | POA: Insufficient documentation

## 2013-11-22 DIAGNOSIS — I2510 Atherosclerotic heart disease of native coronary artery without angina pectoris: Secondary | ICD-10-CM

## 2013-11-22 DIAGNOSIS — R918 Other nonspecific abnormal finding of lung field: Secondary | ICD-10-CM | POA: Diagnosis not present

## 2013-11-22 DIAGNOSIS — J984 Other disorders of lung: Secondary | ICD-10-CM | POA: Diagnosis not present

## 2013-11-22 DIAGNOSIS — E785 Hyperlipidemia, unspecified: Secondary | ICD-10-CM | POA: Insufficient documentation

## 2013-11-22 DIAGNOSIS — R0609 Other forms of dyspnea: Secondary | ICD-10-CM | POA: Diagnosis not present

## 2013-11-22 DIAGNOSIS — R110 Nausea: Secondary | ICD-10-CM

## 2013-11-22 DIAGNOSIS — I10 Essential (primary) hypertension: Secondary | ICD-10-CM | POA: Insufficient documentation

## 2013-11-22 LAB — LIPID PANEL
CHOL/HDL RATIO: 2
Cholesterol: 167 mg/dL (ref 0–200)
HDL: 69.8 mg/dL (ref >39.00)
LDL Cholesterol: 82 mg/dL (ref 0–99)
NONHDL: 97.2
TRIGLYCERIDES: 77 mg/dL (ref 0.0–149.0)
VLDL: 15.4 mg/dL (ref 0.0–40.0)

## 2013-11-22 LAB — ALT: ALT: 12 U/L (ref 0–35)

## 2013-11-22 MED ORDER — REGADENOSON 0.4 MG/5ML IV SOLN
0.4000 mg | Freq: Once | INTRAVENOUS | Status: AC
  Administered 2013-11-22: 0.4 mg via INTRAVENOUS

## 2013-11-22 MED ORDER — TECHNETIUM TC 99M SESTAMIBI GENERIC - CARDIOLITE
11.0000 | Freq: Once | INTRAVENOUS | Status: AC | PRN
  Administered 2013-11-22: 11 via INTRAVENOUS

## 2013-11-22 MED ORDER — AMINOPHYLLINE 25 MG/ML IV SOLN
75.0000 mg | Freq: Once | INTRAVENOUS | Status: AC
  Administered 2013-11-22: 75 mg via INTRAVENOUS

## 2013-11-22 MED ORDER — TECHNETIUM TC 99M SESTAMIBI GENERIC - CARDIOLITE
33.0000 | Freq: Once | INTRAVENOUS | Status: AC | PRN
  Administered 2013-11-22: 33 via INTRAVENOUS

## 2013-11-22 NOTE — Progress Notes (Signed)
[place] [facility] NUCLEAR [facility] [address] [place], [place] [address] [phone]    Cardiology Nuclear Med Study  [address] LATIKA KRONICK is a 68 y.o. female     MRN : [id number]     DOB: [birth year]  Procedure Date: 11/22/2013  Nuclear Med Background Indication for Stress Test:  Evaluation for Ischemia and Stent Patency History:  CAD, MPI 2014 (ischemia) EF 82%, hx seizures, Afib Cardiac Risk Factors: Hypertension and Lipids  Symptoms:  Chest Pain (last date of chest discomfort was two weeks ago) and DOE   Nuclear Pre-Procedure Caffeine/Decaff Intake:  None> 12 hrs NPO After: 5:00pm   Lungs:  clear O2 Sat: 97% on room air. IV 0.9% NS with Angio Cath:  24g  IV Site: R Hand x 1, tolerated well IV Started by:  Irven Baltimore, RN  Chest Size (in):  38 Cup Size: B  Height: 4\' 11"  (1.499 m)  Weight:  136 lb (61.689 kg)  BMI:  Body mass index is 27.45 kg/(m^2). Tech Comments:  Patient held Toprol x 24 hrs. Patient took seizure medication (Keppra) today. Irven Baltimore, RN.    Nuclear Med Study 1 or 2 day study: 1 day  Stress Test Type:  Lexiscan  Reading MD: N/A  Order Authorizing Provider:  Fransico Him, MD  Resting Radionuclide: Technetium 41m Sestamibi  Resting Radionuclide Dose: 11.0 mCi   Stress Radionuclide:  Technetium 46m Sestamibi  Stress Radionuclide Dose: 33.0 mCi           Stress Protocol Rest HR: 64 Stress HR: 107  Rest BP: 134/79 Stress BP: 123/64  Exercise Time (min): n/a METS: n/a   Predicted Max HR: 153 bpm % Max HR: 69.93 bpm Rate Pressure Product: 14766   Dose of Adenosine (mg):  n/a Dose of Lexiscan: 0.4 mg  Dose of Atropine (mg): n/a Dose of Dobutamine: n/a mcg/kg/min (at max HR)  Stress Test Technologist: Glade Lloyd, BS-ES  Nuclear Technologist:  Earl Many, CNMT     Rest Procedure:  Myocardial perfusion imaging was performed at rest 45 minutes following the intravenous administration of Technetium 23m Sestamibi. Rest ECG: NSR - Normal  EKG  Stress Procedure:  The patient received IV Lexiscan 0.4 mg over 15-seconds.  Technetium 47m Sestamibi injected at 30-seconds.  Quantitative spect images were obtained after a 45 minute delay.   During the infusion of Lexiscan the patient became SOB, lightheaded, weakness and nauseated.  Aminophylline 75 mg IVP given 6 minutes post Lexiscan due to persistent Nausea with quick resolution of symptoms.  Stress ECG: No significant change from baseline ECG  QPS Raw Data Images:  Normal; no motion artifact; normal heart/lung ratio. Stress Images:  Normal homogeneous uptake in all areas of the myocardium. Rest Images:  Normal homogeneous uptake in all areas of the myocardium. Subtraction (SDS):  No evidence of ischemia. Transient Ischemic Dilatation (Normal <1.22):  1.01 Lung/Heart Ratio (Normal <0.45):  0.33  Quantitative Gated Spect Images QGS EDV:  33 ml QGS ESV:  6 ml  Impression Exercise Capacity:  Lexiscan with no exercise. BP Response:  Normal blood pressure response. Clinical Symptoms:  No significant symptoms noted. ECG Impression:  No significant ST segment change suggestive of ischemia. Comparison with Prior Nuclear Study: mild apical ischemia described in February 2014 is not appreciated on the current study  Overall Impression:  Normal stress nuclear study.  LV Ejection Fraction: 81%.  LV Wall Motion:  NL LV Function; NL Wall Motion   Sanda Klein, MD, [facility]  [facility] [phone] office [phone] pager

## 2013-11-23 ENCOUNTER — Inpatient Hospital Stay: Admission: RE | Admit: 2013-11-23 | Payer: Medicare Other | Source: Ambulatory Visit

## 2013-11-23 ENCOUNTER — Other Ambulatory Visit (HOSPITAL_COMMUNITY): Payer: Self-pay | Admitting: Otolaryngology

## 2013-11-23 DIAGNOSIS — H9311 Tinnitus, right ear: Secondary | ICD-10-CM | POA: Diagnosis not present

## 2013-11-23 DIAGNOSIS — R42 Dizziness and giddiness: Secondary | ICD-10-CM | POA: Diagnosis not present

## 2013-11-23 DIAGNOSIS — H9190 Unspecified hearing loss, unspecified ear: Secondary | ICD-10-CM

## 2013-11-23 DIAGNOSIS — H9041 Sensorineural hearing loss, unilateral, right ear, with unrestricted hearing on the contralateral side: Secondary | ICD-10-CM | POA: Diagnosis not present

## 2013-11-23 DIAGNOSIS — H9121 Sudden idiopathic hearing loss, right ear: Secondary | ICD-10-CM | POA: Diagnosis not present

## 2013-11-28 ENCOUNTER — Encounter: Payer: Self-pay | Admitting: Neurology

## 2013-11-30 ENCOUNTER — Telehealth: Payer: Self-pay | Admitting: Cardiology

## 2013-11-30 DIAGNOSIS — E785 Hyperlipidemia, unspecified: Secondary | ICD-10-CM

## 2013-11-30 MED ORDER — ROSUVASTATIN CALCIUM 10 MG PO TABS: 20.0000 mg | ORAL_TABLET | Freq: Every morning | ORAL | Status: DC

## 2013-11-30 NOTE — Telephone Encounter (Signed)
Spoke with patient again about increasing Crestor. Informed patient 20 mg samples are being placed in the front office for her to pick up on Monday. Lab appointment made for January 14, 2014. Patient agrees with treatment plan.

## 2013-11-30 NOTE — Telephone Encounter (Signed)
-----   Message from Sueanne Margarita, MD sent at 11/23/2013 10:18 AM EST ----- Lipids not at goal please increase Crestor to 20mg  daily and recheck FLP and ALT in 6 weeks

## 2013-11-30 NOTE — Telephone Encounter (Signed)
New message  ° ° °Returning call back to nurse.  °

## 2013-12-01 ENCOUNTER — Other Ambulatory Visit: Payer: Self-pay | Admitting: Cardiology

## 2013-12-03 DIAGNOSIS — E1165 Type 2 diabetes mellitus with hyperglycemia: Secondary | ICD-10-CM | POA: Diagnosis not present

## 2013-12-03 DIAGNOSIS — E039 Hypothyroidism, unspecified: Secondary | ICD-10-CM | POA: Diagnosis not present

## 2013-12-03 DIAGNOSIS — I10 Essential (primary) hypertension: Secondary | ICD-10-CM | POA: Diagnosis not present

## 2013-12-03 DIAGNOSIS — E209 Hypoparathyroidism, unspecified: Secondary | ICD-10-CM | POA: Diagnosis not present

## 2013-12-04 ENCOUNTER — Telehealth: Payer: Self-pay | Admitting: Cardiology

## 2013-12-04 ENCOUNTER — Encounter: Payer: Self-pay | Admitting: Neurology

## 2013-12-04 NOTE — Telephone Encounter (Signed)
Informed patient paperwork was received and will be faxed today.  Placed in "to be faxed" bin in medical records.

## 2013-12-04 NOTE — Telephone Encounter (Signed)
New problem   Pt left some forms at front desk for medication Crestor. Please look at forms and fill out and advise pt. Pt stated they need new prescription.

## 2013-12-04 NOTE — Telephone Encounter (Signed)
Walk In pt form " [employer]" Forms Dropped Off gave to [place]

## 2013-12-05 ENCOUNTER — Ambulatory Visit (HOSPITAL_COMMUNITY)
Admission: RE | Admit: 2013-12-05 | Discharge: 2013-12-05 | Disposition: A | Discharge status: Home / Self Care | Payer: Medicare Other | Source: Ambulatory Visit | Attending: Otolaryngology | Admitting: Otolaryngology

## 2013-12-05 DIAGNOSIS — H9191 Unspecified hearing loss, right ear: Secondary | ICD-10-CM | POA: Insufficient documentation

## 2013-12-05 DIAGNOSIS — H9190 Unspecified hearing loss, unspecified ear: Secondary | ICD-10-CM

## 2013-12-05 MED ORDER — GADOBENATE DIMEGLUMINE 529 MG/ML IV SOLN
15.0000 mL | Freq: Once | INTRAVENOUS | Status: AC | PRN
  Administered 2013-12-05: 15 mL via INTRAVENOUS

## 2013-12-11 ENCOUNTER — Other Ambulatory Visit: Payer: Self-pay | Admitting: Cardiology

## 2013-12-11 DIAGNOSIS — L905 Scar conditions and fibrosis of skin: Secondary | ICD-10-CM | POA: Diagnosis not present

## 2013-12-11 DIAGNOSIS — E039 Hypothyroidism, unspecified: Secondary | ICD-10-CM | POA: Diagnosis not present

## 2013-12-11 DIAGNOSIS — D2261 Melanocytic nevi of right upper limb, including shoulder: Secondary | ICD-10-CM | POA: Diagnosis not present

## 2013-12-11 DIAGNOSIS — D225 Melanocytic nevi of trunk: Secondary | ICD-10-CM | POA: Diagnosis not present

## 2013-12-11 DIAGNOSIS — L814 Other melanin hyperpigmentation: Secondary | ICD-10-CM | POA: Diagnosis not present

## 2013-12-11 DIAGNOSIS — I10 Essential (primary) hypertension: Secondary | ICD-10-CM | POA: Diagnosis not present

## 2013-12-11 DIAGNOSIS — D2239 Melanocytic nevi of other parts of face: Secondary | ICD-10-CM | POA: Diagnosis not present

## 2013-12-11 DIAGNOSIS — E200 Idiopathic hypoparathyroidism: Secondary | ICD-10-CM | POA: Diagnosis not present

## 2013-12-11 DIAGNOSIS — D224 Melanocytic nevi of scalp and neck: Secondary | ICD-10-CM | POA: Diagnosis not present

## 2013-12-11 DIAGNOSIS — L821 Other seborrheic keratosis: Secondary | ICD-10-CM | POA: Diagnosis not present

## 2013-12-11 DIAGNOSIS — D2262 Melanocytic nevi of left upper limb, including shoulder: Secondary | ICD-10-CM | POA: Diagnosis not present

## 2013-12-11 DIAGNOSIS — E1165 Type 2 diabetes mellitus with hyperglycemia: Secondary | ICD-10-CM | POA: Diagnosis not present

## 2013-12-18 DIAGNOSIS — Z1231 Encounter for screening mammogram for malignant neoplasm of breast: Secondary | ICD-10-CM | POA: Diagnosis not present

## 2013-12-18 DIAGNOSIS — Z01419 Encounter for gynecological examination (general) (routine) without abnormal findings: Secondary | ICD-10-CM | POA: Diagnosis not present

## 2013-12-19 DIAGNOSIS — H25031 Anterior subcapsular polar age-related cataract, right eye: Secondary | ICD-10-CM | POA: Diagnosis not present

## 2013-12-19 DIAGNOSIS — H2512 Age-related nuclear cataract, left eye: Secondary | ICD-10-CM | POA: Diagnosis not present

## 2013-12-19 DIAGNOSIS — H2511 Age-related nuclear cataract, right eye: Secondary | ICD-10-CM | POA: Diagnosis not present

## 2013-12-20 ENCOUNTER — Encounter (HOSPITAL_COMMUNITY): Payer: Self-pay | Admitting: Interventional Cardiology

## 2013-12-21 ENCOUNTER — Other Ambulatory Visit: Payer: Self-pay | Admitting: *Deleted

## 2013-12-21 DIAGNOSIS — J9859 Other diseases of mediastinum, not elsewhere classified: Secondary | ICD-10-CM

## 2013-12-22 DIAGNOSIS — N183 Chronic kidney disease, stage 3 (moderate): Secondary | ICD-10-CM | POA: Diagnosis not present

## 2013-12-22 DIAGNOSIS — I129 Hypertensive chronic kidney disease with stage 1 through stage 4 chronic kidney disease, or unspecified chronic kidney disease: Secondary | ICD-10-CM | POA: Diagnosis not present

## 2013-12-24 ENCOUNTER — Other Ambulatory Visit: Payer: Self-pay | Admitting: *Deleted

## 2013-12-24 DIAGNOSIS — R222 Localized swelling, mass and lump, trunk: Secondary | ICD-10-CM

## 2013-12-26 DIAGNOSIS — R312 Other microscopic hematuria: Secondary | ICD-10-CM | POA: Diagnosis not present

## 2013-12-26 DIAGNOSIS — N390 Urinary tract infection, site not specified: Secondary | ICD-10-CM | POA: Diagnosis not present

## 2013-12-26 DIAGNOSIS — I129 Hypertensive chronic kidney disease with stage 1 through stage 4 chronic kidney disease, or unspecified chronic kidney disease: Secondary | ICD-10-CM | POA: Diagnosis not present

## 2013-12-26 DIAGNOSIS — N183 Chronic kidney disease, stage 3 (moderate): Secondary | ICD-10-CM | POA: Diagnosis not present

## 2014-01-03 DIAGNOSIS — J069 Acute upper respiratory infection, unspecified: Secondary | ICD-10-CM | POA: Diagnosis not present

## 2014-01-14 ENCOUNTER — Other Ambulatory Visit (INDEPENDENT_AMBULATORY_CARE_PROVIDER_SITE_OTHER): Payer: PPO | Admitting: *Deleted

## 2014-01-14 DIAGNOSIS — E785 Hyperlipidemia, unspecified: Secondary | ICD-10-CM

## 2014-01-14 LAB — LIPID PANEL
CHOL/HDL RATIO: 2
Cholesterol: 163 mg/dL (ref 0–200)
HDL: 72.9 mg/dL (ref >39.00)
LDL CALC: 76 mg/dL (ref 0–99)
NonHDL: 90.1
Triglycerides: 69 mg/dL (ref 0.0–149.0)
VLDL: 13.8 mg/dL (ref 0.0–40.0)

## 2014-01-14 LAB — ALT: ALT: 16 U/L (ref 0–35)

## 2014-01-15 LAB — BUN: BUN: 18 mg/dL (ref 6–23)

## 2014-01-15 LAB — CREATININE, SERUM: Creat: 1.4 mg/dL — ABNORMAL HIGH (ref 0.50–1.10)

## 2014-01-17 ENCOUNTER — Other Ambulatory Visit: Payer: Self-pay

## 2014-01-17 DIAGNOSIS — E785 Hyperlipidemia, unspecified: Secondary | ICD-10-CM

## 2014-01-17 DIAGNOSIS — Z79899 Other long term (current) drug therapy: Secondary | ICD-10-CM

## 2014-01-21 ENCOUNTER — Telehealth: Payer: Self-pay | Admitting: Internal Medicine

## 2014-01-28 ENCOUNTER — Other Ambulatory Visit: Payer: Self-pay

## 2014-01-28 MED ORDER — ISOSORBIDE MONONITRATE ER 60 MG PO TB24: 90.0000 mg | ORAL_TABLET | Freq: Every day | ORAL | Status: DC

## 2014-01-28 MED ORDER — DOXAZOSIN MESYLATE 2 MG PO TABS: 2.0000 mg | ORAL_TABLET | Freq: Every day | ORAL | Status: DC

## 2014-01-31 NOTE — Telephone Encounter (Signed)
Lm on vm that Pantoprazole had been approved and should be available to be picked up

## 2014-02-06 ENCOUNTER — Encounter: Payer: Self-pay | Admitting: Cardiothoracic Surgery

## 2014-02-06 ENCOUNTER — Ambulatory Visit (INDEPENDENT_AMBULATORY_CARE_PROVIDER_SITE_OTHER): Payer: PPO | Admitting: Cardiothoracic Surgery

## 2014-02-06 ENCOUNTER — Ambulatory Visit
Admission: RE | Admit: 2014-02-06 | Discharge: 2014-02-06 | Disposition: A | Discharge status: Home / Self Care | Payer: PPO | Source: Ambulatory Visit | Attending: Cardiothoracic Surgery | Admitting: Cardiothoracic Surgery

## 2014-02-06 VITALS — BP 125/79 | HR 70 | Resp 20 | Ht 59.0 in | Wt 144.0 lb

## 2014-02-06 DIAGNOSIS — Z8774 Personal history of (corrected) congenital malformations of heart and circulatory system: Secondary | ICD-10-CM

## 2014-02-06 DIAGNOSIS — J9859 Other diseases of mediastinum, not elsewhere classified: Secondary | ICD-10-CM

## 2014-02-06 DIAGNOSIS — Z9889 Other specified postprocedural states: Secondary | ICD-10-CM

## 2014-02-06 MED ORDER — IOHEXOL 300 MG/ML  SOLN
75.0000 mL | Freq: Once | INTRAMUSCULAR | Status: AC | PRN
  Administered 2014-02-06: 75 mL via INTRAVENOUS

## 2014-02-06 NOTE — Progress Notes (Signed)
PCP is Reginia Naas, MD Referring Provider is Irene Shipper, MD  Chief Complaint  Patient presents with  . Follow-up    9 month f/u with CT Chest    HPI:the patient returns for one year followup with surveillance CT scan of the chest after resection of a large pericardial cyst via median sternotomy. She has no complaints of chest pain or shortness of breath. The incision remains very well-healed and asymptomatic.  CT scan of the chest performed today personally reviewed. There is no evidence recurrent cyst. She has a chronic inflammatory scar in the left lung which has been stablefor over 2 years. She is a nonsmoker. She's had a myocardial perfusion stress test test since her last visit which was negative for ischemia.   Past Medical History  Diagnosis Date  . Irritable bowel syndrome   . Esophageal reflux   . Dysphagia, unspecified(787.20)   . Hypertension   . Depression   . Hypothyroidism   . Seizures   . Iron deficiency anemia   . Internal hemorrhoids   . Coronary artery disease     coronary angiogram on 03/14/12 showed Normal left main coronary artery, 50% proximal and 70% mid stenosis of the LAD and distally at takeoff of second diagonal there is a 50-70% stenosis in a very tortuous LAD, 70-80% ostial stenosis of the left circumflex in a small vessel, and 50% instent restenosis of the mid to distal portion of the RCA stent and 50-70% stenosis of the ostial PDA.   Marland Kitchen Anxiety   . Arthritis   . Blood transfusion 1960  . Heart murmur   . Hyperlipidemia   . Myocardial infarction 11/2009    1 stent placed  . Esophageal stricture   . Hiatal hernia   . Shortness of breath   . Complication of anesthesia     woke up durin surgery x 2  . PONV (postoperative nausea and vomiting)   . Pericardial cyst     benign, s/p resection  . Convulsions/seizures 07/26/2013  . Vitamin D deficiency   . GERD (gastroesophageal reflux disease)   . Gout   . Cervical spondylosis without  myelopathy   . Chronic low back pain   . Obesity   . Carpal tunnel syndrome     Past Surgical History  Procedure Laterality Date  . Breast surgery  2010     benign cysts  . Coronary angioplasty with stent placement  2011 and 2012    drug-eluting stent w/ ion stents to the distal right coronary artery  . Colonoscopy  2013 or 2014  . Mediasternotomy N/A 02/05/2013    Procedure: MEDIAN STERNOTOMY;  Surgeon: Ivin Poot, MD;  Location: [facility];  Service: Thoracic;  Laterality: N/A;  . Biopsy of mediastinal mass N/A 02/05/2013    Procedure: RESECTION OF MEDIASTINAL MASS;  Surgeon: Ivin Poot, MD;  [facility];  Service: Thoracic;  Laterality: N/A;  . Ectopic pregnancy surgery    . Esophageal dilation      Esophageal stricture  . Coronary angiogram  03/10/2011    Procedure: CORONARY ANGIOGRAM;  Surgeon: Jettie Booze, MD;  Location: [facility] CATH LAB;  Service: Cardiovascular;;  . Left heart catheterization with coronary angiogram N/A 03/14/2012    Procedure: LEFT HEART CATHETERIZATION WITH CORONARY ANGIOGRAM;  Surgeon: Sueanne Margarita, MD;  Location: [facility] CATH LAB;  Service: Cardiovascular;  Laterality: N/A;  . Percutaneous coronary stent intervention (pci-s) N/A 04/03/2012    Procedure: PERCUTANEOUS CORONARY STENT INTERVENTION (PCI-S);  Surgeon: Jettie Booze, MD;  Location: [facility] CATH LAB;  Service: Cardiovascular;  Laterality: N/A;    Family History  Problem Relation Age of Onset  . Breast cancer Sister   . Cancer Sister     breast  . Breast cancer      niece  . Heart disease Father   . Hypertension Father   . Emphysema Father   . Coronary artery disease Father   . Colon cancer Neg Hx   . Heart attack Mother   . CVA Mother     Social History History  Substance Use Topics  . Smoking status: Former Smoker -- 0.30 packs/day for 3 years    Types: Cigarettes    Start date: 11/08/1964    Quit date: 11/09/1967  . Smokeless tobacco: Never Used  . Alcohol Use: No     Current Outpatient Prescriptions  Medication Sig Dispense Refill  . ALPRAZolam (XANAX) 0.25 MG tablet Take 0.25 mg by mouth at bedtime.     Marland Kitchen aspirin EC 81 MG tablet Take 81 mg by mouth daily.    . calcitRIOL (ROCALTROL) 0.5 MCG capsule Take 0.5 mcg by mouth daily.     . Calcium Carbonate-Vitamin D (CALCIUM + D PO) Take 1 tablet by mouth daily.    . clopidogrel (PLAVIX) 75 MG tablet Take 1 tablet (75 mg total) by mouth daily with breakfast. 30 tablet 4  . diltiazem (MATZIM LA) 240 MG 24 hr tablet Take 240 mg by mouth daily.    Marland Kitchen doxazosin (CARDURA) 2 MG tablet Take 1 tablet (2 mg total) by mouth daily. 30 tablet 2  . HYDROcodone-acetaminophen (NORCO) 10-325 MG per tablet Take 1 tablet by mouth as needed.    . isosorbide mononitrate (IMDUR) 60 MG 24 hr tablet Take 1.5 tablets (90 mg total) by mouth daily. 45 tablet 6  . levETIRAcetam (KEPPRA) 500 MG tablet Take 1 tablet (500 mg total) by mouth 2 (two) times daily. 60 tablet 11  . levothyroxine (SYNTHROID, LEVOTHROID) 100 MCG tablet Take 100 mcg by mouth daily before breakfast.     . losartan (COZAAR) 100 MG tablet Take 100 mg by mouth daily.    . metoprolol succinate (TOPROL-XL) 50 MG 24 hr tablet TAKE 1 TABLET (50 MG TOTAL) BY MOUTH DAILY. TAKE WITH OR IMMEDIATELY FOLLOWING A MEAL. 30 tablet 6  . nitroGLYCERIN (NITROSTAT) 0.4 MG SL tablet Place 0.4 mg under the tongue every 5 (five) minutes as needed for chest pain.    . pantoprazole (PROTONIX) 40 MG tablet Take 1 tablet (40 mg total) by mouth 2 (two) times daily. 60 tablet 11  . PARoxetine (PAXIL) 30 MG tablet Take 30 mg by mouth daily.    . potassium chloride SA (K-DUR,KLOR-CON) 20 MEQ tablet Take 20 mEq by mouth 2 (two) times daily.    . rosuvastatin (CRESTOR) 10 MG tablet Take 2 tablets (20 mg total) by mouth every morning. 30 tablet 5   No current facility-administered medications for this visit.    No Known Allergies  Review of Systems    General:   Normal appetite, no weight  loss, no fever or night sweats Cardiac:     -  Chest pain with exertion,   -chest pain at rest,   -SOB with exertion,                   - PND,  - orthopnea,  + palpitations or arrhythmias,     -history atrial fibrillation                -  dizzy spells-presyncope, - Syncope, - LE edema Respiratory:  No Shortness of breath,  no home oxygen, no productive cough, no                  sleep apnea, no CPAP at night, no hemoptysis, no COPD GI:           No difficulty swallowing, no reflux, no hiatal hernia-heartburn, no chronic                          abdominal pain, no hematochezia, no hematemesis, no melena GU:        No dysuria, no frequency, no UTI recently, +recent tea -colored urine consistent with hematuria, she is scheduled to see her primary care physician later today No History of kidney stones,               No BPH Vascular: No pain suggestive of claudication, no varicose veins, no DVT, no nonhealing                Foot ulcer, no rest pain suggestive of ischemia Neuro:   No stroke, no TIAs, no seizures, no neuropathy, no gait instability, no    memory/cognitive dysfunction positive history of tremor               Musculoskeletal:  No arthritis, no joint swelling, no difficulty walking, no decreased                        Mobility Skin:     No rash, no ulcerations or pressure sores Psych:    No anxiety, no depression, Eyes:    No change in vision, no amaurosis, no eye surgery ENT:    No hearing loss, no loose or painful teeth, no dentures, no recent dental                          procedure Hematologic:  No easy bruising, no bleeding disorder, no frequent epistaxes Endocrine:  No diabetes,  - checks CBG at home    BP 125/79 mmHg  Pulse 70  Resp 20  Ht 4\' 11"  (1.499 m)  Wt 144 lb (65.318 kg)  BMI 29.07 kg/m2  SpO2 96% Physical Exam  General: well-developed middle-aged female no distress HEENT: Normocephalic pupils equal , dentition adequate Neck: Supple without JVD, adenopathy, or  bruit Chest: Clear to auscultation, symmetrical breath sounds, no rhonchi, no tenderness             or deformity. Sternal incision well-healed and stable Cardiovascular: Regular rate and rhythm, 2/6 systolic murmur, no gallop, peripheral pulses             palpable in all extremities Abdomen:  Soft, nontender, no palpable mass or organomegaly Extremities: Warm, well-perfused, no clubbing cyanosis edema or tenderness,              no venous stasis changes of the legs Rectal/GU: Deferred Neuro: Grossly non--focal and symmetrical throughout Skin: Clean and dry without rash or ulceration    Diagnostic Tests: CT scan personally reviewed showing no recurrent pericardial cyst No at risk pulmonary lesions noted on current scan  Impression: No recurrence of benign pericardial cyst after resection postop one year. No further followup needed.  Plan: Return as needed.

## 2014-02-08 ENCOUNTER — Other Ambulatory Visit: Payer: Self-pay | Admitting: Urology

## 2014-02-10 ENCOUNTER — Emergency Department (HOSPITAL_COMMUNITY)
Admission: EM | Admit: 2014-02-10 | Discharge: 2014-02-11 | Disposition: A | Discharge status: Home / Self Care | Payer: PPO | Source: Home / Self Care | Attending: Emergency Medicine | Admitting: Emergency Medicine

## 2014-02-10 ENCOUNTER — Encounter (HOSPITAL_COMMUNITY): Payer: Self-pay | Admitting: Emergency Medicine

## 2014-02-10 DIAGNOSIS — N183 Chronic kidney disease, stage 3 (moderate): Secondary | ICD-10-CM | POA: Diagnosis present

## 2014-02-10 DIAGNOSIS — E079 Disorder of thyroid, unspecified: Secondary | ICD-10-CM | POA: Insufficient documentation

## 2014-02-10 DIAGNOSIS — Z87891 Personal history of nicotine dependence: Secondary | ICD-10-CM

## 2014-02-10 DIAGNOSIS — E559 Vitamin D deficiency, unspecified: Secondary | ICD-10-CM | POA: Insufficient documentation

## 2014-02-10 DIAGNOSIS — Z87442 Personal history of urinary calculi: Secondary | ICD-10-CM

## 2014-02-10 DIAGNOSIS — R1310 Dysphagia, unspecified: Secondary | ICD-10-CM | POA: Diagnosis present

## 2014-02-10 DIAGNOSIS — Z792 Long term (current) use of antibiotics: Secondary | ICD-10-CM

## 2014-02-10 DIAGNOSIS — K224 Dyskinesia of esophagus: Secondary | ICD-10-CM | POA: Diagnosis present

## 2014-02-10 DIAGNOSIS — Z9889 Other specified postprocedural states: Secondary | ICD-10-CM | POA: Insufficient documentation

## 2014-02-10 DIAGNOSIS — Z823 Family history of stroke: Secondary | ICD-10-CM

## 2014-02-10 DIAGNOSIS — E669 Obesity, unspecified: Secondary | ICD-10-CM

## 2014-02-10 DIAGNOSIS — M1990 Unspecified osteoarthritis, unspecified site: Secondary | ICD-10-CM | POA: Insufficient documentation

## 2014-02-10 DIAGNOSIS — K317 Polyp of stomach and duodenum: Secondary | ICD-10-CM | POA: Diagnosis present

## 2014-02-10 DIAGNOSIS — F419 Anxiety disorder, unspecified: Secondary | ICD-10-CM

## 2014-02-10 DIAGNOSIS — Z7982 Long term (current) use of aspirin: Secondary | ICD-10-CM

## 2014-02-10 DIAGNOSIS — D649 Anemia, unspecified: Secondary | ICD-10-CM | POA: Diagnosis present

## 2014-02-10 DIAGNOSIS — F319 Bipolar disorder, unspecified: Secondary | ICD-10-CM | POA: Diagnosis present

## 2014-02-10 DIAGNOSIS — K589 Irritable bowel syndrome without diarrhea: Secondary | ICD-10-CM | POA: Diagnosis present

## 2014-02-10 DIAGNOSIS — Z79899 Other long term (current) drug therapy: Secondary | ICD-10-CM

## 2014-02-10 DIAGNOSIS — F329 Major depressive disorder, single episode, unspecified: Secondary | ICD-10-CM | POA: Insufficient documentation

## 2014-02-10 DIAGNOSIS — I129 Hypertensive chronic kidney disease with stage 1 through stage 4 chronic kidney disease, or unspecified chronic kidney disease: Secondary | ICD-10-CM | POA: Diagnosis present

## 2014-02-10 DIAGNOSIS — I2510 Atherosclerotic heart disease of native coronary artery without angina pectoris: Secondary | ICD-10-CM | POA: Insufficient documentation

## 2014-02-10 DIAGNOSIS — I10 Essential (primary) hypertension: Secondary | ICD-10-CM | POA: Insufficient documentation

## 2014-02-10 DIAGNOSIS — Z9861 Coronary angioplasty status: Secondary | ICD-10-CM

## 2014-02-10 DIAGNOSIS — K210 Gastro-esophageal reflux disease with esophagitis: Secondary | ICD-10-CM | POA: Diagnosis present

## 2014-02-10 DIAGNOSIS — Z7902 Long term (current) use of antithrombotics/antiplatelets: Secondary | ICD-10-CM

## 2014-02-10 DIAGNOSIS — N23 Unspecified renal colic: Secondary | ICD-10-CM

## 2014-02-10 DIAGNOSIS — Z959 Presence of cardiac and vascular implant and graft, unspecified: Secondary | ICD-10-CM

## 2014-02-10 DIAGNOSIS — K2970 Gastritis, unspecified, without bleeding: Principal | ICD-10-CM | POA: Diagnosis present

## 2014-02-10 DIAGNOSIS — R011 Cardiac murmur, unspecified: Secondary | ICD-10-CM

## 2014-02-10 DIAGNOSIS — N179 Acute kidney failure, unspecified: Secondary | ICD-10-CM | POA: Diagnosis present

## 2014-02-10 DIAGNOSIS — K219 Gastro-esophageal reflux disease without esophagitis: Secondary | ICD-10-CM | POA: Insufficient documentation

## 2014-02-10 DIAGNOSIS — Z87448 Personal history of other diseases of urinary system: Secondary | ICD-10-CM

## 2014-02-10 DIAGNOSIS — N390 Urinary tract infection, site not specified: Secondary | ICD-10-CM | POA: Insufficient documentation

## 2014-02-10 DIAGNOSIS — E039 Hypothyroidism, unspecified: Secondary | ICD-10-CM | POA: Diagnosis present

## 2014-02-10 DIAGNOSIS — N289 Disorder of kidney and ureter, unspecified: Secondary | ICD-10-CM | POA: Diagnosis not present

## 2014-02-10 DIAGNOSIS — M109 Gout, unspecified: Secondary | ICD-10-CM | POA: Diagnosis present

## 2014-02-10 DIAGNOSIS — D72829 Elevated white blood cell count, unspecified: Secondary | ICD-10-CM | POA: Diagnosis present

## 2014-02-10 DIAGNOSIS — G8929 Other chronic pain: Secondary | ICD-10-CM | POA: Diagnosis present

## 2014-02-10 DIAGNOSIS — Z8249 Family history of ischemic heart disease and other diseases of the circulatory system: Secondary | ICD-10-CM

## 2014-02-10 DIAGNOSIS — M545 Low back pain: Secondary | ICD-10-CM | POA: Diagnosis present

## 2014-02-10 DIAGNOSIS — Z803 Family history of malignant neoplasm of breast: Secondary | ICD-10-CM

## 2014-02-10 DIAGNOSIS — E785 Hyperlipidemia, unspecified: Secondary | ICD-10-CM

## 2014-02-10 DIAGNOSIS — I252 Old myocardial infarction: Secondary | ICD-10-CM | POA: Insufficient documentation

## 2014-02-10 DIAGNOSIS — Z79891 Long term (current) use of opiate analgesic: Secondary | ICD-10-CM

## 2014-02-10 DIAGNOSIS — E860 Dehydration: Secondary | ICD-10-CM | POA: Diagnosis present

## 2014-02-10 DIAGNOSIS — Z955 Presence of coronary angioplasty implant and graft: Secondary | ICD-10-CM

## 2014-02-10 DIAGNOSIS — G40909 Epilepsy, unspecified, not intractable, without status epilepticus: Secondary | ICD-10-CM | POA: Diagnosis present

## 2014-02-10 DIAGNOSIS — Z825 Family history of asthma and other chronic lower respiratory diseases: Secondary | ICD-10-CM

## 2014-02-10 LAB — CBC WITH DIFFERENTIAL/PLATELET
Basophils Absolute: 0 10*3/uL (ref 0.0–0.1)
Basophils Relative: 0 % (ref 0–1)
Eosinophils Absolute: 0.2 10*3/uL (ref 0.0–0.7)
Eosinophils Relative: 1 % (ref 0–5)
HCT: 34.1 % — ABNORMAL LOW (ref 36.0–46.0)
Hemoglobin: 11.4 g/dL — ABNORMAL LOW (ref 12.0–15.0)
Lymphocytes Relative: 17 % (ref 12–46)
Lymphs Abs: 2 10*3/uL (ref 0.7–4.0)
MCH: 30.7 pg (ref 26.0–34.0)
MCHC: 33.4 g/dL (ref 30.0–36.0)
MCV: 91.9 fL (ref 78.0–100.0)
MONOS PCT: 11 % (ref 3–12)
Monocytes Absolute: 1.3 10*3/uL — ABNORMAL HIGH (ref 0.1–1.0)
NEUTROS ABS: 8.4 10*3/uL — AB (ref 1.7–7.7)
Neutrophils Relative %: 71 % (ref 43–77)
PLATELETS: 235 10*3/uL (ref 150–400)
RBC: 3.71 MIL/uL — ABNORMAL LOW (ref 3.87–5.11)
RDW: 12.4 % (ref 11.5–15.5)
WBC: 11.9 10*3/uL — ABNORMAL HIGH (ref 4.0–10.5)

## 2014-02-10 LAB — COMPREHENSIVE METABOLIC PANEL
ALBUMIN: 3.7 g/dL (ref 3.5–5.2)
ALT: 13 U/L (ref 0–35)
ANION GAP: 8 (ref 5–15)
AST: 20 U/L (ref 0–37)
Alkaline Phosphatase: 71 U/L (ref 39–117)
BILIRUBIN TOTAL: 0.6 mg/dL (ref 0.3–1.2)
BUN: 17 mg/dL (ref 6–23)
CALCIUM: 9.7 mg/dL (ref 8.4–10.5)
CO2: 26 mmol/L (ref 19–32)
Chloride: 107 mmol/L (ref 96–112)
Creatinine, Ser: 1.53 mg/dL — ABNORMAL HIGH (ref 0.50–1.10)
GFR calc Af Amer: 39 mL/min — ABNORMAL LOW (ref >90)
GFR calc non Af Amer: 34 mL/min — ABNORMAL LOW (ref >90)
Glucose, Bld: 125 mg/dL — ABNORMAL HIGH (ref 70–99)
POTASSIUM: 3.5 mmol/L (ref 3.5–5.1)
SODIUM: 141 mmol/L (ref 135–145)
Total Protein: 6.7 g/dL (ref 6.0–8.3)

## 2014-02-10 LAB — URINALYSIS, ROUTINE W REFLEX MICROSCOPIC
Glucose, UA: NEGATIVE mg/dL
Ketones, ur: NEGATIVE mg/dL
Nitrite: NEGATIVE
PH: 5.5 (ref 5.0–8.0)
Protein, ur: 100 mg/dL — AB
Specific Gravity, Urine: 1.017 (ref 1.005–1.030)
Urobilinogen, UA: 0.2 mg/dL (ref 0.0–1.0)

## 2014-02-10 LAB — URINE MICROSCOPIC-ADD ON

## 2014-02-10 LAB — LIPASE, BLOOD: Lipase: 26 U/L (ref 11–59)

## 2014-02-10 MED ORDER — ONDANSETRON HCL 4 MG/2ML IJ SOLN
4.0000 mg | Freq: Once | INTRAMUSCULAR | Status: AC
  Administered 2014-02-10: 4 mg via INTRAVENOUS
  Filled 2014-02-10: qty 2

## 2014-02-10 MED ORDER — HYDROMORPHONE HCL 1 MG/ML IJ SOLN
0.5000 mg | INTRAMUSCULAR | Status: AC | PRN
  Administered 2014-02-10 (×3): 0.5 mg via INTRAVENOUS
  Filled 2014-02-10 (×2): qty 1

## 2014-02-10 MED ORDER — CIPROFLOXACIN HCL 500 MG PO TABS: 500.0000 mg | ORAL_TABLET | Freq: Two times a day (BID) | ORAL | Status: DC

## 2014-02-10 MED ORDER — ONDANSETRON HCL 4 MG PO TABS: 4.0000 mg | ORAL_TABLET | Freq: Four times a day (QID) | ORAL | Status: DC

## 2014-02-10 MED ORDER — SODIUM CHLORIDE 0.9 % IV SOLN
1000.0000 mL | INTRAVENOUS | Status: DC
  Administered 2014-02-10: 1000 mL via INTRAVENOUS

## 2014-02-10 MED ORDER — OXYCODONE-ACETAMINOPHEN 5-325 MG PO TABS: 1.0000 | ORAL_TABLET | Freq: Four times a day (QID) | ORAL | Status: DC | PRN

## 2014-02-10 MED ORDER — CIPROFLOXACIN HCL 500 MG PO TABS
500.0000 mg | ORAL_TABLET | Freq: Once | ORAL | Status: AC
  Administered 2014-02-11: 500 mg via ORAL
  Filled 2014-02-10: qty 1

## 2014-02-10 MED ORDER — SODIUM CHLORIDE 0.9 % IV SOLN
1000.0000 mL | Freq: Once | INTRAVENOUS | Status: AC
  Administered 2014-02-10: 1000 mL via INTRAVENOUS

## 2014-02-10 NOTE — ED Notes (Signed)
Per patient/family-started having hematuria first of the week-saw Dr Benay Spice and was scoped-stone blocking kidney, right flank pain-was not given any meds, thought back pain meds would help but they are'nt-still having hematuria

## 2014-02-10 NOTE — Discharge Instructions (Signed)
Ureteral Colic (Kidney Stones) °Ureteral colic is the result of a condition when kidney stones form inside the kidney. Once kidney stones are formed they may move into the tube that connects the kidney with the bladder (ureter). If this occurs, this condition may cause pain (colic) in the ureter.  °CAUSES  °Pain is caused by stone movement in the ureter and the obstruction caused by the stone. °SYMPTOMS  °The pain comes and goes as the ureter contracts around the stone. The pain is usually intense, sharp, and stabbing in character. The location of the pain may move as the stone moves through the ureter. When the stone is near the kidney the pain is usually located in the back and radiates to the belly (abdomen). When the stone is ready to pass into the bladder the pain is often located in the lower abdomen on the side the stone is located. At this location, the symptoms may mimic those of a urinary tract infection with urinary frequency. Once the stone is located here it often passes into the bladder and the pain disappears completely. °TREATMENT  °· Your caregiver will provide you with medicine for pain relief. °· You may require specialized follow-up X-rays. °· The absence of pain does not always mean that the stone has passed. It may have just stopped moving. If the urine remains completely obstructed, it can cause loss of kidney function or even complete destruction of the involved kidney. It is your responsibility and in your interest that X-rays and follow-ups as suggested by your caregiver are completed. Relief of pain without passage of the stone can be associated with severe damage to the kidney, including loss of kidney function on that side. °· If your stone does not pass on its own, additional measures may be taken by your caregiver to ensure its removal. °HOME CARE INSTRUCTIONS  °· Increase your fluid intake. Water is the preferred fluid since juices containing vitamin C may acidify the urine making it  less likely for certain stones (uric acid stones) to pass. °· Strain all urine. A strainer will be provided. Keep all particulate matter or stones for your caregiver to inspect. °· Take your pain medicine as directed. °· Make a follow-up appointment with your caregiver as directed. °· Remember that the goal is passage of your stone. The absence of pain does not mean the stone is gone. Follow your caregiver's instructions. °· Only take over-the-counter or prescription medicines for pain, discomfort, or fever as directed by your caregiver. °SEEK MEDICAL CARE IF:  °· Pain cannot be controlled with the prescribed medicine. °· You have a fever. °· Pain continues for longer than your caregiver advises it should. °· There is a change in the pain, and you develop chest discomfort or constant abdominal pain. °· You feel faint or pass out. °MAKE SURE YOU:  °· Understand these instructions. °· Will watch your condition. °· Will get help right away if you are not doing well or get worse. °Document Released: 10/07/2004 Document Revised: 04/24/2012 Document Reviewed: 06/24/2010 °ExitCare® Patient Information ©2015 [employer]. This information is not intended to replace advice given to you by your health care provider. Make sure you discuss any questions you have with your health care provider. ° °

## 2014-02-10 NOTE — ED Provider Notes (Signed)
CSN: [id number]     Arrival date & time 02/10/14  1859 History   First MD Initiated Contact with Patient 02/10/14 1920     Chief Complaint  Patient presents with  . Nephrolithiasis     HPI Comments: Initially started with some blood in her urine.  She saw her doctor and was referred to urology.  She had a cystoscopy that was normal and had an CT scan that showed kidney stones.  Dr Risa Grill was planning on doing a procedure this coming week (break up the stone).  Family called Dr McDairmid and was told to come to the ED.  Patient is a 69 y.o. female presenting with flank pain. The history is provided by the patient and a relative.  Flank Pain This is a new problem. The current episode started more than 2 days ago (it started on friday). The problem occurs constantly. The problem has been rapidly worsening. Nothing relieves the symptoms. She has tried nothing for the symptoms.    Past Medical History  Diagnosis Date  . Irritable bowel syndrome   . Esophageal reflux   . Dysphagia, unspecified(787.20)   . Hypertension   . Depression   . Hypothyroidism   . Seizures   . Iron deficiency anemia   . Internal hemorrhoids   . Coronary artery disease     coronary angiogram on 03/14/12 showed Normal left main coronary artery, 50% proximal and 70% mid stenosis of the LAD and distally at takeoff of second diagonal there is a 50-70% stenosis in a very tortuous LAD, 70-80% ostial stenosis of the left circumflex in a small vessel, and 50% instent restenosis of the mid to distal portion of the RCA stent and 50-70% stenosis of the ostial PDA.   Marland Kitchen Anxiety   . Arthritis   . Blood transfusion 1960  . Heart murmur   . Hyperlipidemia   . Myocardial infarction 11/2009    1 stent placed  . Esophageal stricture   . Hiatal hernia   . Shortness of breath   . Complication of anesthesia     woke up durin surgery x 2  . PONV (postoperative nausea and vomiting)   . Pericardial cyst     benign, s/p resection  .  Convulsions/seizures 07/26/2013  . Vitamin D deficiency   . GERD (gastroesophageal reflux disease)   . Gout   . Cervical spondylosis without myelopathy   . Chronic low back pain   . Obesity   . Carpal tunnel syndrome   . Renal disorder    Past Surgical History  Procedure Laterality Date  . Breast surgery  2010     benign cysts  . Coronary angioplasty with stent placement  2011 and 2012    drug-eluting stent w/ ion stents to the distal right coronary artery  . Colonoscopy  2013 or 2014  . Mediasternotomy N/A 02/05/2013    Procedure: MEDIAN STERNOTOMY;  Surgeon: Ivin Poot, MD;  Location: [place];  Service: Thoracic;  Laterality: N/A;  . Biopsy of mediastinal mass N/A 02/05/2013    Procedure: RESECTION OF MEDIASTINAL MASS;  Surgeon: Ivin Poot, MD;  Location: [facility] OR;  Service: Thoracic;  Laterality: N/A;  . Ectopic pregnancy surgery    . Esophageal dilation      Esophageal stricture  . Coronary angiogram  03/10/2011    Procedure: CORONARY ANGIOGRAM;  Surgeon: Jettie Booze, MD;  Location: [facility] CATH LAB;  Service: Cardiovascular;;  . Left heart catheterization with coronary angiogram N/A 03/14/2012  Procedure: LEFT HEART CATHETERIZATION WITH CORONARY ANGIOGRAM;  Surgeon: Sueanne Margarita, MD;  Location: [place] CATH LAB;  Service: Cardiovascular;  Laterality: N/A;  . Percutaneous coronary stent intervention (pci-s) N/A 04/03/2012    Procedure: PERCUTANEOUS CORONARY STENT INTERVENTION (PCI-S);  Surgeon: Jettie Booze, MD;  Location: [facility] CATH LAB;  Service: Cardiovascular;  Laterality: N/A;   Family History  Problem Relation Age of Onset  . Breast cancer Sister   . Cancer Sister     breast  . Breast cancer      niece  . Heart disease Father   . Hypertension Father   . Emphysema Father   . Coronary artery disease Father   . Colon cancer Neg Hx   . Heart attack Mother   . CVA Mother    History  Substance Use Topics  . Smoking status: Former Smoker -- 0.30 packs/day for 3  years    Types: Cigarettes    Start date: 11/08/1964    Quit date: 11/09/1967  . Smokeless tobacco: Never Used  . Alcohol Use: No   OB History    No data available     Review of Systems  Genitourinary: Positive for flank pain.  All other systems reviewed and are negative.     Allergies  Review of patient's allergies indicates no known allergies.  Home Medications   Prior to Admission medications   Medication Sig Start Date End Date Taking? Authorizing Provider  ALPRAZolam (XANAX) 0.25 MG tablet Take 0.25 mg by mouth at bedtime.    Yes Historical Provider, MD  aspirin EC 81 MG tablet Take 81 mg by mouth daily.   Yes Historical Provider, MD  calcitRIOL (ROCALTROL) 0.5 MCG capsule Take 0.5 mcg by mouth daily.    Yes Historical Provider, MD  Calcium Carbonate-Vitamin D (CALCIUM + D PO) Take 1 tablet by mouth daily.   Yes Historical Provider, MD  clopidogrel (PLAVIX) 75 MG tablet Take 1 tablet (75 mg total) by mouth daily with breakfast. 09/19/13  Yes Sueanne Margarita, MD  diltiazem (MATZIM LA) 240 MG 24 hr tablet Take 240 mg by mouth daily.   Yes Historical Provider, MD  doxazosin (CARDURA) 2 MG tablet Take 1 tablet (2 mg total) by mouth daily. 01/28/14  Yes Sueanne Margarita, MD  HYDROcodone-acetaminophen (NORCO) 10-325 MG per tablet Take 1 tablet by mouth every 6 (six) hours as needed for moderate pain or severe pain (pain).  06/07/13  Yes Historical Provider, MD  isosorbide mononitrate (IMDUR) 60 MG 24 hr tablet Take 1.5 tablets (90 mg total) by mouth daily. 01/28/14  Yes Sueanne Margarita, MD  levETIRAcetam (KEPPRA) 500 MG tablet Take 1 tablet (500 mg total) by mouth 2 (two) times daily. 07/26/13  Yes Kathrynn Ducking, MD  levothyroxine (SYNTHROID, LEVOTHROID) 100 MCG tablet Take 100 mcg by mouth daily before breakfast.    Yes Historical Provider, MD  losartan (COZAAR) 100 MG tablet Take 100 mg by mouth daily.   Yes Historical Provider, MD  metoprolol succinate (TOPROL-XL) 50 MG 24 hr tablet  TAKE 1 TABLET (50 MG TOTAL) BY MOUTH DAILY. TAKE WITH OR IMMEDIATELY FOLLOWING A MEAL. 12/03/13  Yes Sueanne Margarita, MD  pantoprazole (PROTONIX) 40 MG tablet Take 1 tablet (40 mg total) by mouth 2 (two) times daily. 02/21/13  Yes Irene Shipper, MD  PARoxetine (PAXIL) 30 MG tablet Take 30 mg by mouth daily. 11/17/13  Yes Historical Provider, MD  potassium chloride SA (K-DUR,KLOR-CON) 20 MEQ tablet Take 20  mEq by mouth 2 (two) times daily.   Yes Historical Provider, MD  rosuvastatin (CRESTOR) 20 MG tablet Take 20 mg by mouth daily.   Yes Historical Provider, MD  ciprofloxacin (CIPRO) 500 MG tablet Take 1 tablet (500 mg total) by mouth 2 (two) times daily. 02/10/14   Dorie Rank, MD  nitroGLYCERIN (NITROSTAT) 0.4 MG SL tablet Place 0.4 mg under the tongue every 5 (five) minutes as needed for chest pain.    Historical Provider, MD  ondansetron (ZOFRAN) 4 MG tablet Take 1 tablet (4 mg total) by mouth every 6 (six) hours. 02/10/14   Dorie Rank, MD  oxyCODONE-acetaminophen (PERCOCET/ROXICET) 5-325 MG per tablet Take 1-2 tablets by mouth every 6 (six) hours as needed. 02/10/14   Dorie Rank, MD  rosuvastatin (CRESTOR) 10 MG tablet Take 2 tablets (20 mg total) by mouth every morning. Patient not taking: Reported on 02/10/2014 11/30/13   Sueanne Margarita, MD   BP 111/52 mmHg  Pulse 66  Temp(Src) 98.4 F (36.9 C) (Oral)  Resp 18  SpO2 98% Physical Exam  Constitutional: She appears well-developed. She appears distressed.  HENT:  Head: Normocephalic and atraumatic.  Right Ear: External ear normal.  Left Ear: External ear normal.  Eyes: Conjunctivae are normal. Right eye exhibits no discharge. Left eye exhibits no discharge. No scleral icterus.  Neck: Neck supple. No tracheal deviation present.  Cardiovascular: Normal rate, regular rhythm and intact distal pulses.   Pulmonary/Chest: Effort normal and breath sounds normal. No stridor. No respiratory distress. She has no wheezes. She has no rales.  Abdominal: Soft.  Bowel sounds are normal. She exhibits no distension. There is tenderness in the right upper quadrant and right lower quadrant. There is CVA tenderness (right). There is no rebound and no guarding.  Musculoskeletal: She exhibits no edema or tenderness.  Neurological: She is alert. She has normal strength. No cranial nerve deficit (no facial droop, extraocular movements intact, no slurred speech) or sensory deficit. She exhibits normal muscle tone. She displays no seizure activity. Coordination normal.  Skin: Skin is warm and dry. No rash noted.  Psychiatric: She has a normal mood and affect.  Nursing note and vitals reviewed.   ED Course  Procedures (including critical care time) Labs Review Labs Reviewed  CBC WITH DIFFERENTIAL/PLATELET - Abnormal; Notable for the following:    WBC 11.9 (*)    RBC 3.71 (*)    Hemoglobin 11.4 (*)    HCT 34.1 (*)    Neutro Abs 8.4 (*)    Monocytes Absolute 1.3 (*)    All other components within normal limits  URINALYSIS, ROUTINE W REFLEX MICROSCOPIC - Abnormal; Notable for the following:    Color, Urine RED (*)    APPearance TURBID (*)    Hgb urine dipstick LARGE (*)    Bilirubin Urine SMALL (*)    Protein, ur 100 (*)    Leukocytes, UA MODERATE (*)    All other components within normal limits  COMPREHENSIVE METABOLIC PANEL - Abnormal; Notable for the following:    Glucose, Bld 125 (*)    Creatinine, Ser 1.53 (*)    GFR calc non Af Amer 34 (*)    GFR calc Af Amer 39 (*)    All other components within normal limits  URINE MICROSCOPIC-ADD ON - Abnormal; Notable for the following:    Bacteria, UA MANY (*)    All other components within normal limits  LIPASE, BLOOD    Medications  0.9 %  sodium chloride infusion (  0 mLs Intravenous Stopped 02/10/14 2033)    Followed by  0.9 %  sodium chloride infusion (1,000 mLs Intravenous New Bag/Given 02/10/14 2033)  ciprofloxacin (CIPRO) tablet 500 mg (not administered)  ondansetron (ZOFRAN) injection 4 mg (4 mg  Intravenous Given 02/10/14 2002)  HYDROmorphone (DILAUDID) injection 0.5 mg (0.5 mg Intravenous Given 02/10/14 2129)     MDM   Final diagnoses:  Ureteral colic  UTI (lower urinary tract infection)    Pt is feeling better after treatment.  She soul prefer to go home. UA does suggest possible infection.  Unable to get in touch with urology on call however pt is feeling better, has plans for outpatient follow up.  Possible uTI.  Will start on cipro.  Dc home with percocet for pain.  Return for fever, worsening symptoms    Dorie Rank, MD 02/10/14 [phone]

## 2014-02-10 NOTE — ED Notes (Signed)
Urine obtained and sent. Pt states improvement in pain.

## 2014-02-10 NOTE — ED Notes (Signed)
Bed: WA05 Expected date:  Expected time:  Means of arrival:  Comments: Hold for triage 2  

## 2014-02-11 ENCOUNTER — Other Ambulatory Visit: Payer: Self-pay | Admitting: Internal Medicine

## 2014-02-11 ENCOUNTER — Encounter (HOSPITAL_BASED_OUTPATIENT_CLINIC_OR_DEPARTMENT_OTHER): Payer: Self-pay | Admitting: *Deleted

## 2014-02-11 NOTE — Progress Notes (Addendum)
NPO AFTER MN. ARRIVE AT 0900. CURRENT LAB RESULTS, CHEST CT AND EKG IN CHART AND EPIC. WILL TAKE AM MEDS W/ SIPS OF WATER AND IF NEEDED TAKE PAIN/ NAUSEA MEDS .  REVIEWED W/ DR CARIGNAN MDA, WHOM IS MEDS DOS. STATES OK TO PROCEED.

## 2014-02-13 ENCOUNTER — Encounter (HOSPITAL_BASED_OUTPATIENT_CLINIC_OR_DEPARTMENT_OTHER): Payer: Self-pay

## 2014-02-13 ENCOUNTER — Ambulatory Visit (HOSPITAL_BASED_OUTPATIENT_CLINIC_OR_DEPARTMENT_OTHER): Payer: PPO | Admitting: Anesthesiology

## 2014-02-13 ENCOUNTER — Encounter (HOSPITAL_BASED_OUTPATIENT_CLINIC_OR_DEPARTMENT_OTHER)
Admission: RE | Disposition: A | Discharge status: Home / Self Care | Payer: Self-pay | Source: Ambulatory Visit | Attending: Urology

## 2014-02-13 ENCOUNTER — Ambulatory Visit (HOSPITAL_BASED_OUTPATIENT_CLINIC_OR_DEPARTMENT_OTHER)
Admission: RE | Admit: 2014-02-13 | Discharge: 2014-02-13 | Disposition: A | Discharge status: Home / Self Care | Payer: PPO | Source: Ambulatory Visit | Attending: Urology | Admitting: Urology

## 2014-02-13 DIAGNOSIS — K219 Gastro-esophageal reflux disease without esophagitis: Secondary | ICD-10-CM | POA: Insufficient documentation

## 2014-02-13 DIAGNOSIS — N200 Calculus of kidney: Secondary | ICD-10-CM

## 2014-02-13 DIAGNOSIS — E669 Obesity, unspecified: Secondary | ICD-10-CM

## 2014-02-13 DIAGNOSIS — I2510 Atherosclerotic heart disease of native coronary artery without angina pectoris: Secondary | ICD-10-CM

## 2014-02-13 DIAGNOSIS — R1310 Dysphagia, unspecified: Secondary | ICD-10-CM | POA: Diagnosis not present

## 2014-02-13 DIAGNOSIS — Z87891 Personal history of nicotine dependence: Secondary | ICD-10-CM | POA: Insufficient documentation

## 2014-02-13 DIAGNOSIS — I10 Essential (primary) hypertension: Secondary | ICD-10-CM | POA: Insufficient documentation

## 2014-02-13 DIAGNOSIS — F419 Anxiety disorder, unspecified: Secondary | ICD-10-CM | POA: Insufficient documentation

## 2014-02-13 DIAGNOSIS — I252 Old myocardial infarction: Secondary | ICD-10-CM

## 2014-02-13 DIAGNOSIS — E039 Hypothyroidism, unspecified: Secondary | ICD-10-CM

## 2014-02-13 DIAGNOSIS — Z6828 Body mass index (BMI) 28.0-28.9, adult: Secondary | ICD-10-CM | POA: Insufficient documentation

## 2014-02-13 DIAGNOSIS — K2970 Gastritis, unspecified, without bleeding: Secondary | ICD-10-CM | POA: Diagnosis not present

## 2014-02-13 DIAGNOSIS — F329 Major depressive disorder, single episode, unspecified: Secondary | ICD-10-CM | POA: Insufficient documentation

## 2014-02-13 DIAGNOSIS — Z955 Presence of coronary angioplasty implant and graft: Secondary | ICD-10-CM

## 2014-02-13 DIAGNOSIS — E559 Vitamin D deficiency, unspecified: Secondary | ICD-10-CM | POA: Diagnosis not present

## 2014-02-13 DIAGNOSIS — N179 Acute kidney failure, unspecified: Secondary | ICD-10-CM | POA: Diagnosis not present

## 2014-02-13 DIAGNOSIS — R310 Gross hematuria: Secondary | ICD-10-CM

## 2014-02-13 HISTORY — PX: CYSTOSCOPY WITH RETROGRADE PYELOGRAM, URETEROSCOPY AND STENT PLACEMENT: SHX5789

## 2014-02-13 HISTORY — DX: Other specified postprocedural states: Z98.890

## 2014-02-13 HISTORY — DX: Personal history of (corrected) congenital malformations of heart and circulatory system: Z87.74

## 2014-02-13 HISTORY — DX: Personal history of other diseases of the musculoskeletal system and connective tissue: Z87.39

## 2014-02-13 HISTORY — PX: HOLMIUM LASER APPLICATION: SHX5852

## 2014-02-13 HISTORY — DX: Localization-related (focal) (partial) symptomatic epilepsy and epileptic syndromes with simple partial seizures, not intractable, without status epilepticus: G40.109

## 2014-02-13 HISTORY — DX: Gross hematuria: R31.0

## 2014-02-13 HISTORY — DX: Personal history of other diseases of the digestive system: Z87.19

## 2014-02-13 SURGERY — CYSTOURETEROSCOPY, WITH RETROGRADE PYELOGRAM AND STENT INSERTION
Anesthesia: General | Site: Ureter | Laterality: Right

## 2014-02-13 MED ORDER — BELLADONNA ALKALOIDS-OPIUM 16.2-60 MG RE SUPP
RECTAL | Status: DC | PRN
  Administered 2014-02-13: 1 via RECTAL

## 2014-02-13 MED ORDER — BELLADONNA ALKALOIDS-OPIUM 16.2-60 MG RE SUPP
RECTAL | Status: AC
  Filled 2014-02-13: qty 1

## 2014-02-13 MED ORDER — LIDOCAINE HCL 2 % EX GEL
CUTANEOUS | Status: DC | PRN
  Administered 2014-02-13: 1 via URETHRAL

## 2014-02-13 MED ORDER — FENTANYL CITRATE 0.05 MG/ML IJ SOLN
25.0000 ug | INTRAMUSCULAR | Status: DC | PRN
  Filled 2014-02-13: qty 1

## 2014-02-13 MED ORDER — DEXAMETHASONE SODIUM PHOSPHATE 4 MG/ML IJ SOLN
INTRAMUSCULAR | Status: DC | PRN
  Administered 2014-02-13: 10 mg via INTRAVENOUS

## 2014-02-13 MED ORDER — ONDANSETRON HCL 4 MG/2ML IJ SOLN
INTRAMUSCULAR | Status: DC | PRN
  Administered 2014-02-13: 4 mg via INTRAVENOUS

## 2014-02-13 MED ORDER — FENTANYL CITRATE 0.05 MG/ML IJ SOLN
INTRAMUSCULAR | Status: DC | PRN
  Administered 2014-02-13 (×8): 12.5 ug via INTRAVENOUS

## 2014-02-13 MED ORDER — MEPERIDINE HCL 25 MG/ML IJ SOLN
6.2500 mg | INTRAMUSCULAR | Status: DC | PRN
  Filled 2014-02-13: qty 1

## 2014-02-13 MED ORDER — CEFAZOLIN SODIUM-DEXTROSE 2-3 GM-% IV SOLR
2.0000 g | INTRAVENOUS | Status: AC
  Administered 2014-02-13: 2 g via INTRAVENOUS
  Filled 2014-02-13: qty 50

## 2014-02-13 MED ORDER — ONDANSETRON HCL 4 MG/2ML IJ SOLN
INTRAMUSCULAR | Status: AC
  Filled 2014-02-13: qty 2

## 2014-02-13 MED ORDER — PROPOFOL 10 MG/ML IV BOLUS
INTRAVENOUS | Status: DC | PRN
  Administered 2014-02-13: 150 mg via INTRAVENOUS

## 2014-02-13 MED ORDER — EPHEDRINE SULFATE 50 MG/ML IJ SOLN
INTRAMUSCULAR | Status: DC | PRN
  Administered 2014-02-13 (×2): 10 mg via INTRAVENOUS

## 2014-02-13 MED ORDER — SODIUM CHLORIDE 0.9 % IR SOLN
Status: DC | PRN
Start: 2014-02-13 — End: 2014-02-13
  Administered 2014-02-13: 4000 mL

## 2014-02-13 MED ORDER — ONDANSETRON HCL 4 MG/2ML IJ SOLN
4.0000 mg | Freq: Once | INTRAMUSCULAR | Status: AC
  Administered 2014-02-13: 4 mg via INTRAVENOUS
  Filled 2014-02-13: qty 2

## 2014-02-13 MED ORDER — LACTATED RINGERS IV SOLN
INTRAVENOUS | Status: DC
  Administered 2014-02-13: 09:00:00 via INTRAVENOUS
  Filled 2014-02-13: qty 1000

## 2014-02-13 MED ORDER — PROMETHAZINE HCL 25 MG/ML IJ SOLN
6.2500 mg | INTRAMUSCULAR | Status: DC | PRN
  Filled 2014-02-13: qty 1

## 2014-02-13 MED ORDER — MIDAZOLAM HCL 2 MG/2ML IJ SOLN
INTRAMUSCULAR | Status: AC
  Filled 2014-02-13: qty 2

## 2014-02-13 MED ORDER — SUCCINYLCHOLINE CHLORIDE 20 MG/ML IJ SOLN
INTRAMUSCULAR | Status: DC | PRN
  Administered 2014-02-13: 100 mg via INTRAVENOUS

## 2014-02-13 MED ORDER — LIDOCAINE HCL (CARDIAC) 20 MG/ML IV SOLN
INTRAVENOUS | Status: DC | PRN
  Administered 2014-02-13: 60 mg via INTRAVENOUS

## 2014-02-13 MED ORDER — FENTANYL CITRATE 0.05 MG/ML IJ SOLN
INTRAMUSCULAR | Status: AC
  Filled 2014-02-13: qty 6

## 2014-02-13 MED ORDER — ACETAMINOPHEN 10 MG/ML IV SOLN
INTRAVENOUS | Status: DC | PRN
  Administered 2014-02-13: 1000 mg via INTRAVENOUS

## 2014-02-13 MED ORDER — IOHEXOL 350 MG/ML SOLN
INTRAVENOUS | Status: DC | PRN
  Administered 2014-02-13: 5 mL

## 2014-02-13 MED ORDER — CEFAZOLIN SODIUM 1-5 GM-% IV SOLN
1.0000 g | INTRAVENOUS | Status: DC
  Filled 2014-02-13: qty 50

## 2014-02-13 SURGICAL SUPPLY — 47 items
ADAPTER CATH URET PLST 4-6FR (CATHETERS) IMPLANT
ADPR CATH URET STRL DISP 4-6FR (CATHETERS)
APL SKNCLS STERI-STRIP NONHPOA (GAUZE/BANDAGES/DRESSINGS)
BAG DRAIN URO-CYSTO SKYTR STRL (DRAIN) ×3 IMPLANT
BAG DRN UROCATH (DRAIN) ×1
BASKET LASER NITINOL 1.9FR (BASKET) IMPLANT
BASKET STNLS GEMINI 4WIRE 3FR (BASKET) IMPLANT
BASKET ZERO TIP NITINOL 2.4FR (BASKET) ×5 IMPLANT
BENZOIN TINCTURE PRP APPL 2/3 (GAUZE/BANDAGES/DRESSINGS) IMPLANT
BSKT STON RTRVL 120 1.9FR (BASKET)
BSKT STON RTRVL GEM 120X11 3FR (BASKET)
BSKT STON RTRVL ZERO TP 2.4FR (BASKET) ×2
CANISTER SUCT LVC 12 LTR MEDI- (MISCELLANEOUS) ×2 IMPLANT
CATH INTERMIT  6FR 70CM (CATHETERS) IMPLANT
CATH URET 5FR 28IN CONE TIP (BALLOONS)
CATH URET 5FR 28IN OPEN ENDED (CATHETERS) IMPLANT
CATH URET 5FR 70CM CONE TIP (BALLOONS) IMPLANT
CLOTH BEACON ORANGE TIMEOUT ST (SAFETY) ×3 IMPLANT
DRAPE CAMERA CLOSED 9X96 (DRAPES) ×3 IMPLANT
DRSG TEGADERM 2-3/8X2-3/4 SM (GAUZE/BANDAGES/DRESSINGS) IMPLANT
FIBER LASER FLEXIVA 1000 (UROLOGICAL SUPPLIES) IMPLANT
FIBER LASER FLEXIVA 200 (UROLOGICAL SUPPLIES) IMPLANT
FIBER LASER FLEXIVA 365 (UROLOGICAL SUPPLIES) IMPLANT
FIBER LASER FLEXIVA 550 (UROLOGICAL SUPPLIES) IMPLANT
FIBER LASER TRAC TIP (UROLOGICAL SUPPLIES) ×2 IMPLANT
GLOVE BIO SURGEON STRL SZ 6.5 (GLOVE) ×1 IMPLANT
GLOVE BIO SURGEON STRL SZ7.5 (GLOVE) ×3 IMPLANT
GLOVE BIO SURGEONS STRL SZ 6.5 (GLOVE) ×1
GLOVE BIOGEL PI IND STRL 6.5 (GLOVE) IMPLANT
GLOVE BIOGEL PI INDICATOR 6.5 (GLOVE) ×4
GOWN STRL REIN XL XLG (GOWN DISPOSABLE) ×3 IMPLANT
GOWN STRL REUS W/TWL LRG LVL3 (GOWN DISPOSABLE) ×2 IMPLANT
GOWN STRL REUS W/TWL XL LVL3 (GOWN DISPOSABLE) ×2 IMPLANT
GUIDEWIRE 0.038 PTFE COATED (WIRE) IMPLANT
GUIDEWIRE ANG ZIPWIRE 038X150 (WIRE) IMPLANT
GUIDEWIRE STR DUAL SENSOR (WIRE) ×2 IMPLANT
IV NS 1000ML (IV SOLUTION) ×3
IV NS 1000ML BAXH (IV SOLUTION) IMPLANT
IV NS IRRIG 3000ML ARTHROMATIC (IV SOLUTION) ×4 IMPLANT
KIT BALLIN UROMAX 15FX10 (LABEL) IMPLANT
KIT BALLN UROMAX 15FX4 (MISCELLANEOUS) IMPLANT
KIT BALLN UROMAX 26 75X4 (MISCELLANEOUS)
NS IRRIG 500ML POUR BTL (IV SOLUTION) IMPLANT
PACK CYSTO (CUSTOM PROCEDURE TRAY) ×3 IMPLANT
SET HIGH PRES BAL DIL (LABEL)
SHEATH ACCESS URETERAL 38CM (SHEATH) IMPLANT
STENT URET 6FRX24 CONTOUR (STENTS) ×2 IMPLANT

## 2014-02-13 NOTE — H&P (Signed)
History of Present Illness   [name] presents today after almost a 3-year hiatus from our practice. She is sent back over by Dr Carol Ada. [name] is 69 years of age. In the spring of 2013, we saw her secondary to some gross hematuria, as well as some nonspecific lumbosacral back discomfort. Assessment at that time included a cystoscopy which failed to reveal any evidence of bladder tumor other pathology. A stone protocol CT scan was performed which showed a 4 mm nonobstructing left lower-pole stone. There was never a completely clear answer for why she had had hematuria. That problem, however, did subsequently resolve. Of note, urine cytologic testing was also negative at that time.   Over the last several days she has had recurrent gross hematuria. This has not been associated with any obvious change in voiding symptoms. She has had no clear evidence of urinary tract infection. Urinalysis in our office today shows too numerous to count red blood cells, but there is no evidence of any significant pyuria or bacteruria. She has been having some nonspecific right lower back and lower quadrant abdominal discomfort. She has chronic back issues, and so pain can be somewhat different for her interpret at times.   [name] did undergo a CT of her abdomen and pelvis with contrast back in early November. There was no mention of any renal calculi or, certainly, no evidence of hydronephrosis or renal mass at that time. No other significant abnormalities were appreciated. Of note, recent creatinine level was normal/borderline at 1.3. She has been sent to Nephrology due to some mild renal insufficiency. Recent hemoglobin was 11.9.      Past Medical History Problems  1. History of Chronic Reflux Esophagitis 2. History of Coronary Artery Disease 3. History of carpal tunnel syndrome (Z86.69) 4. History of depression (Z86.59) 5. History of hemorrhoids (Z87.19) 6. History of hypertension (Z86.79) 7.  History of hypothyroidism (Z86.39) 8. History of myocardial infarction (I25.2) 9. History of Normochromic, Normocytic Anemia 10. History of Seizure 11. History of Spondylolisthesis 12. History of Tachycardia (R00.0)  Surgical History Problems  1. History of Breast Surgery 2. History of Esophagogastroduodenoscopy 3. History of Excision Of Lesion Of Chest Wall 4. History of Gynecologic Surgery 5. History of Heart Surgery 6. History of Heart Surgery  Current Meds 1. Adult Aspirin Low Strength 81 MG TBDP;  Therapy: (Recorded:18Apr2013) to Recorded 2. ALPRAZolam 0.25 MG Oral Tablet;  Therapy: 22Mar2013 to Recorded 3. Calcitriol 0.5 MCG Oral Capsule;  Therapy: (Recorded:18Apr2013) to Recorded 4. Calcium + D TABS;  Therapy: (Recorded:18Apr2013) to Recorded 5. Cardizem LA 240 MG Oral Tablet Extended Release 24 Hour;  Therapy: (Recorded:18Apr2013) to Recorded 6. Clopidogrel Bisulfate 75 MG Oral Tablet;  Therapy: [phone] to Recorded 7. Crestor 10 MG Oral Tablet;  Therapy: (Recorded:18Apr2013) to Recorded 8. Doxazosin Mesylate 2 MG Oral Tablet;  Therapy: 22Mar2013 to Recorded 9. Hydrocodone-Acetaminophen 10-325 MG Oral Tablet;  Therapy: (Recorded:29Jan2016) to Recorded 10. Isosorbide Mononitrate ER 30 MG Oral Tablet Extended Release 24 Hour;   Therapy: 27Feb2013 to Recorded 11. Keppra 500 MG Oral Tablet;   Therapy: (Recorded:18Apr2013) to Recorded 12. Klor-Con 20 MEQ Oral Packet;   Therapy: (Recorded:29Jan2016) to Recorded 13. Levoxyl 100 MCG Oral Tablet;   Therapy: [id number] to Recorded 14. Losartan Potassium 100 MG Oral Tablet;   Therapy: [id number] to Recorded 15. Metoprolol Tartrate 50 MG Oral Tablet;   Therapy: (Recorded:29Jan2016) to Recorded 16. Nitrostat 0.4 MG Sublingual Tablet Sublingual;   Therapy: (Recorded:18Apr2013) to Recorded 17. Pantoprazole Sodium 40 MG  Oral Tablet Delayed Release;   Therapy: [id number] to Recorded 18. PARoxetine HCl - 30 MG Oral Tablet;    Therapy: [id number] to Recorded  Allergies Medication  1. No Known Drug Allergies  Family History Problems  1. Family history of Breast Cancer : Sister 2. Family history of Death In The Family Father : Father   73's 3. Family history of Death In The Family Mother : Mother   78's 4. Family history of Emphysema : Father 5. Family history of Hypertension : Father 55. Family history of Stroke Syndrome : Mother  Social History Problems  1. Being A Social Drinker 2. Former smoker [phone])   quit smoking 1971  Review of Systems Genitourinary, constitutional, skin, eye, otolaryngeal, hematologic/lymphatic, cardiovascular, pulmonary, endocrine, musculoskeletal, gastrointestinal, neurological and psychiatric system(s) were reviewed and pertinent findings if present are noted and are otherwise negative.  Genitourinary: nocturia, incontinence (sui) and hematuria, but no urinary frequency, no urinary urgency and no dysuria.  Gastrointestinal: nausea, abdominal pain, heartburn and constipation.  Respiratory: shortness of breath.  Musculoskeletal: back pain and joint pain.    Vitals Vital Signs [Data Includes: Last 1 Day]  Recorded: 29Jan2016 10:46AM  Height: 4 ft 11.5 in Weight: 144 lb  BMI Calculated: 28.6 BSA Calculated: 1.61 Blood Pressure: 131 / 83 Temperature: 98.2 F Heart Rate: 66  Physical Exam Constitutional: Well nourished and well developed . No acute distress.  ENT:. The ears and nose are normal in appearance.  Neck: The appearance of the neck is normal and no neck mass is present.  Pulmonary: No respiratory distress and normal respiratory rhythm and effort.  Cardiovascular: Heart rate and rhythm are normal . No peripheral edema.  Abdomen: The abdomen is soft and nontender. No masses are palpated. No CVA tenderness. No hernias are palpable. No hepatosplenomegaly noted.  Genitourinary:  Chaperone Present: .  Examination of the external genitalia shows normal female  external genitalia and no lesions. The urethra is normal in appearance and not tender. There is no urethral mass. Vaginal exam demonstrates atrophy, but no abnormalities. The bladder is non tender and not distended. The anus is normal on inspection. The perineum is normal on inspection.  Skin: Normal skin turgor, no visible rash and no visible skin lesions.  Neuro/Psych:. Mood and affect are appropriate.    Results/Data Urine [Data Includes: Last 1 Day]   [id number]  COLOR RED   APPEARANCE TURBID   SPECIFIC GRAVITY >1.030   pH 5.5   GLUCOSE NEG mg/dL  BILIRUBIN NEG   KETONE 15 mg/dL  BLOOD LARGE   PROTEIN 100 mg/dL  UROBILINOGEN 1 mg/dL  NITRITE NEG   LEUKOCYTE ESTERASE TRACE   SQUAMOUS EPITHELIAL/HPF NONE SEEN   WBC 0-2 WBC/hpf  RBC TNTC RBC/hpf  BACTERIA RARE   CRYSTALS NONE SEEN   CASTS NONE SEEN    Procedure  Procedure: Cystoscopy  Chaperone Present: laura.  Indication: Hematuria.  Informed Consent: Risks, benefits, and potential adverse events were discussed and informed consent was obtained. Specific risks including, but not limited to bleeding, infection, pain, allergic reaction etc. were explained.  Antibiotic prophylaxis: Ciprofloxacin.  Procedure Note:  Bladder: Visulization was obscured due to blood. The right ureteral orifice had bloody efflux. The left ureteral orifice had clear efflux of urine. A systematic survey of the bladder demonstrated no bladder tumors or stones. The patient tolerated the procedure well.  Complications: None.    Assessment Assessed  1. Gross hematuria (R31.0) 2. Nephrolithiasis (N20.0) 3. Ureteral calculus (N20.1)  Plan Gross hematuria  1. Cysto; Status:Hold For - Appointment,Date of Service; Requested CLE:[id number];  Health Maintenance  2. UA With REFLEX; [Do Not Release]; Status:Complete;   Done: [id number] 10:28AM Nephrolithiasis  3. AU CT-STONE PROTOCOL; Status:In Progress - Specimen/Data Collected;   Done:  29Jan2016  12:00AM  Discussion/Summary   [name] has intermittently had some issues with gross hematuria. She currently presents with several days of gross hematuria and objective evidence of ongoing bleeding today without evidence of infection. She has also had some chronic issues with back and abdominal discomfort. Much of this is related to her chronic musculoskeletal back issues. On cystoscopy today, it was clear to me that she had bloody efflux of urine from the right ureteral orifice which lateralized her hematuria to the right side. For that reason, I thought it was important to re-image that right kidney. She had just had CT of her abdomen and pelvis 2-3 months ago, but with IV contrast, a stone that was in the collecting system can be missed. Indeed on imaging today, she appears to have a 5 mm stone right at her ureteropelvic junction on the right side. There appears to be some very mild obstruction, but certainly no high-grade hydronephrosis. She also has another second 1-2 mm stone. This is almost certainly the cause of her hematuria. This may be a contributing factor to some of her abdominal discomfort. The stone does not show up very well on the scout images of the CT. Her urine pH is 5.5 and I suspect this stone may be mixed composition uric acid with a little show of calcium. I have suggested that ureteroscopy would be the best treatment option. That procedure, risk, benefits, discussed with them. I would attempt to get her on the schedule for definitive surgery sometime in the next couple of weeks.  cc:  Carol Ada, MD  Scarlette Shorts, MD  Dr Sheran Luz Electronically signed by : Rana Snare, M.D.; Feb 08 2014  4:59PM EST

## 2014-02-13 NOTE — Anesthesia Preprocedure Evaluation (Addendum)
Anesthesia Evaluation  Patient identified by MRN, date of birth, ID band Patient awake    Reviewed: Allergy & Precautions, H&P , NPO status , Patient's Chart, lab work & pertinent test results, reviewed documented beta blocker date and time   History of Anesthesia Complications (+) PONV  Airway Mallampati: II  TM Distance: >3 FB     Dental  (+) Teeth Intact, Dental Advisory Given   Pulmonary shortness of breath, former smoker,  breath sounds clear to auscultation        Cardiovascular hypertension, Pt. on medications and Pt. on home beta blockers + angina + CAD, + Past MI and + Cardiac Stents (2011) + Valvular Problems/Murmurs Rhythm:Regular Rate:Normal     Neuro/Psych Seizures -, Well Controlled,  Anxiety Depression    GI/Hepatic hiatal hernia, GERD-  ,  Endo/Other  Hypothyroidism   Renal/GU      Musculoskeletal   Abdominal (+) + obese,   Peds  Hematology   Anesthesia Other Findings   Reproductive/Obstetrics                            Anesthesia Physical  Anesthesia Plan  ASA: III  Anesthesia Plan: General   Post-op Pain Management:    Induction: Intravenous  Airway Management Planned: Oral ETT  Additional Equipment:   Intra-op Plan:   Post-operative Plan: Extubation in OR and Possible Post-op intubation/ventilation  Informed Consent: I have reviewed the patients History and Physical, chart, labs and discussed the procedure including the risks, benefits and alternatives for the proposed anesthesia with the patient or authorized representative who has indicated his/her understanding and acceptance.   Dental advisory given  Plan Discussed with: Surgeon and CRNA  Anesthesia Plan Comments:        Anesthesia Quick Evaluation

## 2014-02-13 NOTE — Transfer of Care (Signed)
Immediate Anesthesia Transfer of Care Note  Patient: [name]  Procedure(s) Performed: Procedure(s) (LRB): CYSTOSCOPY WITH RETROGRADE PYELOGRAM, URETEROSCOPY AND STENT PLACEMENT (Right) HOLMIUM LASER APPLICATION (Right)  Patient Location: PACU  Anesthesia Type: General  Level of Consciousness: awake, sedated, patient cooperative and responds to stimulation  Airway & Oxygen Therapy: Patient Spontanous Breathing and Patient connected to face mask oxygen  Post-op Assessment: Report given to PACU RN, Post -op Vital signs reviewed and stable and Patient moving all extremities  Post vital signs: Reviewed and stable  Complications: No apparent anesthesia complications

## 2014-02-13 NOTE — Discharge Instructions (Addendum)

## 2014-02-13 NOTE — Op Note (Signed)
Preoperative diagnosis: Nephrolithiasis with gross hematuria Postoperative diagnosis: Same  Procedure: Cystoscopy, right retrograde pyelogram, flexible ureteroscopy, holmium laser lithotripsy, basketing of stone fragment and double-J stent placement   Surgeon: Bernestine Amass M.D.  Anesthesia: Gen.  Indications: Patient is 69 years of age and was recently assessed for gross hematuria. She underwent CT scan imaging of the abdomen and pelvis in November 2015 which showed no significant urologic abnormalities. The patient subsequently underwent a repeat CT without contrast via stone protocol. This showed a 6 mm stone that was thought to be nonobstructing at that time. We felt a gross hematuria was secondary to the renal calculus. Cystoscopy in our office did reveal hematuria lateralizing to the right ureteral orifice. Since that initial assessment she has had intermittent flank pain and did require a trip to the emergency room. She is also had ongoing nausea and we suspect the stone is now obstructing. She presents now for definitive management. She has had no fever or chills. She has been taking ciprofloxacin.     Technique and findings: Patient was brought the operating room where she had successful induction of general endotracheal anesthesia. She was placed in lithotomy position. She had placement of PAS compression boots and received perioperative Ancef. Appropriate surgical timeout was performed. Cystoscopy revealed unremarkable bladder and urethra. Right retrograde pyelogram revealed a normal ureter with a filling defect at the ureteral pelvic junction and dilation of the calyceal system. Retrograde pyelography was performed with fluoroscopic interpretation.   A guidewire was placed in the right renal pelvis. A laser access sheath was then placed. A flexible digital ureteroscope was then inserted. A 5-6 mm stone was encountered. An additional 2-3 mm stone was also noted. The stones were treated with  holmium laser lithotripter fiber at 0.8 J 5 Hz. Stone was broken into innumerable pieces. One small fragment was basket extracted for stone analysis. No obvious Occasions or problems occurred. We placed a 6 French 24 cm double-J stent. The patient was brought to PACU in stable condition.

## 2014-02-13 NOTE — Anesthesia Procedure Notes (Signed)
Procedure Name: Intubation Date/Time: 02/13/2014 10:49 AM Performed by: Justice Rocher Pre-anesthesia Checklist: Patient identified, Emergency Drugs available, Suction available and Patient being monitored Patient Re-evaluated:Patient Re-evaluated prior to inductionOxygen Delivery Method: Circle System Utilized Preoxygenation: Pre-oxygenation with 100% oxygen Intubation Type: IV induction Ventilation: Mask ventilation without difficulty Laryngoscope Size: Mac and 4 Grade View: Grade I Tube type: Oral Tube size: 7.0 mm Number of attempts: 1 Airway Equipment and Method: Stylet and Oral airway Placement Confirmation: ETT inserted through vocal cords under direct vision,  positive ETCO2 and breath sounds checked- equal and bilateral Secured at: 19 cm Tube secured with: Tape Dental Injury: Teeth and Oropharynx as per pre-operative assessment

## 2014-02-14 ENCOUNTER — Inpatient Hospital Stay (HOSPITAL_COMMUNITY)
Admission: EM | Admit: 2014-02-14 | Discharge: 2014-02-16 | DRG: 392 | Disposition: A | Discharge status: Home Health | Payer: PPO | Attending: Family Medicine | Admitting: Family Medicine

## 2014-02-14 ENCOUNTER — Other Ambulatory Visit: Payer: Self-pay

## 2014-02-14 ENCOUNTER — Encounter (HOSPITAL_COMMUNITY): Payer: Self-pay | Admitting: Emergency Medicine

## 2014-02-14 ENCOUNTER — Emergency Department (HOSPITAL_COMMUNITY): Payer: PPO

## 2014-02-14 DIAGNOSIS — Z87442 Personal history of urinary calculi: Secondary | ICD-10-CM | POA: Diagnosis not present

## 2014-02-14 DIAGNOSIS — D649 Anemia, unspecified: Secondary | ICD-10-CM | POA: Diagnosis present

## 2014-02-14 DIAGNOSIS — M1990 Unspecified osteoarthritis, unspecified site: Secondary | ICD-10-CM | POA: Diagnosis present

## 2014-02-14 DIAGNOSIS — E559 Vitamin D deficiency, unspecified: Secondary | ICD-10-CM | POA: Diagnosis present

## 2014-02-14 DIAGNOSIS — Z87891 Personal history of nicotine dependence: Secondary | ICD-10-CM | POA: Diagnosis not present

## 2014-02-14 DIAGNOSIS — Z955 Presence of coronary angioplasty implant and graft: Secondary | ICD-10-CM | POA: Diagnosis not present

## 2014-02-14 DIAGNOSIS — R079 Chest pain, unspecified: Secondary | ICD-10-CM

## 2014-02-14 DIAGNOSIS — N183 Chronic kidney disease, stage 3 (moderate): Secondary | ICD-10-CM | POA: Diagnosis present

## 2014-02-14 DIAGNOSIS — Z803 Family history of malignant neoplasm of breast: Secondary | ICD-10-CM | POA: Diagnosis not present

## 2014-02-14 DIAGNOSIS — F419 Anxiety disorder, unspecified: Secondary | ICD-10-CM | POA: Diagnosis present

## 2014-02-14 DIAGNOSIS — K219 Gastro-esophageal reflux disease without esophagitis: Secondary | ICD-10-CM | POA: Diagnosis present

## 2014-02-14 DIAGNOSIS — E039 Hypothyroidism, unspecified: Secondary | ICD-10-CM | POA: Diagnosis present

## 2014-02-14 DIAGNOSIS — Z8249 Family history of ischemic heart disease and other diseases of the circulatory system: Secondary | ICD-10-CM | POA: Diagnosis not present

## 2014-02-14 DIAGNOSIS — N179 Acute kidney failure, unspecified: Secondary | ICD-10-CM | POA: Diagnosis present

## 2014-02-14 DIAGNOSIS — K317 Polyp of stomach and duodenum: Secondary | ICD-10-CM | POA: Diagnosis present

## 2014-02-14 DIAGNOSIS — K589 Irritable bowel syndrome without diarrhea: Secondary | ICD-10-CM | POA: Diagnosis present

## 2014-02-14 DIAGNOSIS — M109 Gout, unspecified: Secondary | ICD-10-CM | POA: Diagnosis present

## 2014-02-14 DIAGNOSIS — E860 Dehydration: Secondary | ICD-10-CM | POA: Diagnosis present

## 2014-02-14 DIAGNOSIS — K224 Dyskinesia of esophagus: Secondary | ICD-10-CM | POA: Diagnosis present

## 2014-02-14 DIAGNOSIS — I2510 Atherosclerotic heart disease of native coronary artery without angina pectoris: Secondary | ICD-10-CM | POA: Diagnosis present

## 2014-02-14 DIAGNOSIS — Z825 Family history of asthma and other chronic lower respiratory diseases: Secondary | ICD-10-CM | POA: Diagnosis not present

## 2014-02-14 DIAGNOSIS — D500 Iron deficiency anemia secondary to blood loss (chronic): Secondary | ICD-10-CM | POA: Diagnosis not present

## 2014-02-14 DIAGNOSIS — D72829 Elevated white blood cell count, unspecified: Secondary | ICD-10-CM | POA: Diagnosis not present

## 2014-02-14 DIAGNOSIS — N289 Disorder of kidney and ureter, unspecified: Secondary | ICD-10-CM | POA: Diagnosis not present

## 2014-02-14 DIAGNOSIS — Z823 Family history of stroke: Secondary | ICD-10-CM | POA: Diagnosis not present

## 2014-02-14 DIAGNOSIS — Z79899 Other long term (current) drug therapy: Secondary | ICD-10-CM | POA: Diagnosis not present

## 2014-02-14 DIAGNOSIS — Z7902 Long term (current) use of antithrombotics/antiplatelets: Secondary | ICD-10-CM | POA: Diagnosis not present

## 2014-02-14 DIAGNOSIS — G8929 Other chronic pain: Secondary | ICD-10-CM | POA: Diagnosis present

## 2014-02-14 DIAGNOSIS — I252 Old myocardial infarction: Secondary | ICD-10-CM | POA: Diagnosis not present

## 2014-02-14 DIAGNOSIS — M545 Low back pain: Secondary | ICD-10-CM | POA: Diagnosis present

## 2014-02-14 DIAGNOSIS — G40909 Epilepsy, unspecified, not intractable, without status epilepticus: Secondary | ICD-10-CM | POA: Diagnosis present

## 2014-02-14 DIAGNOSIS — Z8719 Personal history of other diseases of the digestive system: Secondary | ICD-10-CM | POA: Insufficient documentation

## 2014-02-14 DIAGNOSIS — Z79891 Long term (current) use of opiate analgesic: Secondary | ICD-10-CM | POA: Diagnosis not present

## 2014-02-14 DIAGNOSIS — R1310 Dysphagia, unspecified: Secondary | ICD-10-CM | POA: Diagnosis not present

## 2014-02-14 DIAGNOSIS — Z792 Long term (current) use of antibiotics: Secondary | ICD-10-CM | POA: Diagnosis not present

## 2014-02-14 DIAGNOSIS — K2970 Gastritis, unspecified, without bleeding: Secondary | ICD-10-CM | POA: Diagnosis present

## 2014-02-14 DIAGNOSIS — I129 Hypertensive chronic kidney disease with stage 1 through stage 4 chronic kidney disease, or unspecified chronic kidney disease: Secondary | ICD-10-CM | POA: Diagnosis present

## 2014-02-14 DIAGNOSIS — E785 Hyperlipidemia, unspecified: Secondary | ICD-10-CM | POA: Diagnosis present

## 2014-02-14 DIAGNOSIS — K210 Gastro-esophageal reflux disease with esophagitis: Secondary | ICD-10-CM | POA: Diagnosis present

## 2014-02-14 DIAGNOSIS — Z7982 Long term (current) use of aspirin: Secondary | ICD-10-CM | POA: Diagnosis not present

## 2014-02-14 DIAGNOSIS — F319 Bipolar disorder, unspecified: Secondary | ICD-10-CM | POA: Diagnosis present

## 2014-02-14 LAB — CBC
HCT: 31.5 % — ABNORMAL LOW (ref 36.0–46.0)
Hemoglobin: 10.3 g/dL — ABNORMAL LOW (ref 12.0–15.0)
MCH: 30.6 pg (ref 26.0–34.0)
MCHC: 32.7 g/dL (ref 30.0–36.0)
MCV: 93.5 fL (ref 78.0–100.0)
Platelets: 231 10*3/uL (ref 150–400)
RBC: 3.37 MIL/uL — AB (ref 3.87–5.11)
RDW: 12.7 % (ref 11.5–15.5)
WBC: 13.6 10*3/uL — ABNORMAL HIGH (ref 4.0–10.5)

## 2014-02-14 LAB — URINALYSIS, ROUTINE W REFLEX MICROSCOPIC
GLUCOSE, UA: NEGATIVE mg/dL
Ketones, ur: 15 mg/dL — AB
Nitrite: NEGATIVE
PH: 5 (ref 5.0–8.0)
Protein, ur: 100 mg/dL — AB
Specific Gravity, Urine: 1.018 (ref 1.005–1.030)
Urobilinogen, UA: 0.2 mg/dL (ref 0.0–1.0)

## 2014-02-14 LAB — BASIC METABOLIC PANEL
Anion gap: 9 (ref 5–15)
BUN: 27 mg/dL — ABNORMAL HIGH (ref 6–23)
CHLORIDE: 110 mmol/L (ref 96–112)
CO2: 21 mmol/L (ref 19–32)
CREATININE: 2.56 mg/dL — AB (ref 0.50–1.10)
Calcium: 10.9 mg/dL — ABNORMAL HIGH (ref 8.4–10.5)
GFR calc Af Amer: 21 mL/min — ABNORMAL LOW (ref >90)
GFR calc non Af Amer: 18 mL/min — ABNORMAL LOW (ref >90)
Glucose, Bld: 136 mg/dL — ABNORMAL HIGH (ref 70–99)
Potassium: 4.8 mmol/L (ref 3.5–5.1)
SODIUM: 140 mmol/L (ref 135–145)

## 2014-02-14 LAB — I-STAT TROPONIN, ED: Troponin i, poc: 0 ng/mL (ref 0.00–0.08)

## 2014-02-14 LAB — TROPONIN I: Troponin I: <0.03 ng/mL (ref <0.031)

## 2014-02-14 LAB — URINE MICROSCOPIC-ADD ON

## 2014-02-14 LAB — BRAIN NATRIURETIC PEPTIDE: B NATRIURETIC PEPTIDE 5: 793.2 pg/mL — AB (ref 0.0–100.0)

## 2014-02-14 MED ORDER — METOPROLOL SUCCINATE ER 50 MG PO TB24
50.0000 mg | ORAL_TABLET | Freq: Every day | ORAL | Status: DC
  Administered 2014-02-15 – 2014-02-16 (×2): 50 mg via ORAL
  Filled 2014-02-14 (×2): qty 1

## 2014-02-14 MED ORDER — ALPRAZOLAM 0.25 MG PO TABS
0.2500 mg | ORAL_TABLET | Freq: Every day | ORAL | Status: DC
  Administered 2014-02-14 – 2014-02-15 (×2): 0.25 mg via ORAL
  Filled 2014-02-14 (×2): qty 1

## 2014-02-14 MED ORDER — DILTIAZEM HCL ER COATED BEADS 240 MG PO TB24
240.0000 mg | ORAL_TABLET | Freq: Every morning | ORAL | Status: DC
  Administered 2014-02-15 – 2014-02-16 (×2): 240 mg via ORAL
  Filled 2014-02-14 (×4): qty 1

## 2014-02-14 MED ORDER — CIPROFLOXACIN HCL 500 MG PO TABS
500.0000 mg | ORAL_TABLET | Freq: Every day | ORAL | Status: DC
  Administered 2014-02-15 – 2014-02-16 (×2): 500 mg via ORAL
  Filled 2014-02-14 (×2): qty 1

## 2014-02-14 MED ORDER — OXYCODONE-ACETAMINOPHEN 5-325 MG PO TABS: 1.0000 | ORAL_TABLET | Freq: Four times a day (QID) | ORAL | Status: DC | PRN

## 2014-02-14 MED ORDER — ENOXAPARIN SODIUM 30 MG/0.3ML ~~LOC~~ SOLN: 30.0000 mg | SUBCUTANEOUS | Status: DC

## 2014-02-14 MED ORDER — LEVOTHYROXINE SODIUM 100 MCG PO TABS
100.0000 ug | ORAL_TABLET | Freq: Every day | ORAL | Status: DC
  Administered 2014-02-15 – 2014-02-16 (×2): 100 ug via ORAL
  Filled 2014-02-14 (×2): qty 1

## 2014-02-14 MED ORDER — SODIUM CHLORIDE 0.9 % IV BOLUS (SEPSIS)
500.0000 mL | Freq: Once | INTRAVENOUS | Status: AC
  Administered 2014-02-14: 500 mL via INTRAVENOUS

## 2014-02-14 MED ORDER — ROSUVASTATIN CALCIUM 20 MG PO TABS
20.0000 mg | ORAL_TABLET | Freq: Every morning | ORAL | Status: DC
Start: 2014-02-15 — End: 2014-02-16
  Administered 2014-02-15 – 2014-02-16 (×2): 20 mg via ORAL
  Filled 2014-02-14 (×2): qty 1

## 2014-02-14 MED ORDER — ASPIRIN EC 81 MG PO TBEC: 81.0000 mg | DELAYED_RELEASE_TABLET | Freq: Every day | ORAL | Status: DC

## 2014-02-14 MED ORDER — SODIUM CHLORIDE 0.9 % IV SOLN
INTRAVENOUS | Status: DC
  Administered 2014-02-14 – 2014-02-15 (×3): via INTRAVENOUS

## 2014-02-14 MED ORDER — PANTOPRAZOLE SODIUM 40 MG PO TBEC
40.0000 mg | DELAYED_RELEASE_TABLET | Freq: Two times a day (BID) | ORAL | Status: DC
  Administered 2014-02-14 – 2014-02-16 (×5): 40 mg via ORAL
  Filled 2014-02-14 (×5): qty 1

## 2014-02-14 MED ORDER — CLOPIDOGREL BISULFATE 75 MG PO TABS
75.0000 mg | ORAL_TABLET | Freq: Every day | ORAL | Status: DC
  Administered 2014-02-15 – 2014-02-16 (×2): 75 mg via ORAL
  Filled 2014-02-14 (×3): qty 1

## 2014-02-14 MED ORDER — LEVETIRACETAM 500 MG PO TABS
500.0000 mg | ORAL_TABLET | Freq: Two times a day (BID) | ORAL | Status: DC
  Administered 2014-02-14 – 2014-02-16 (×4): 500 mg via ORAL
  Filled 2014-02-14 (×4): qty 1

## 2014-02-14 MED ORDER — ENSURE COMPLETE PO LIQD
237.0000 mL | Freq: Two times a day (BID) | ORAL | Status: DC
  Administered 2014-02-16: 237 mL via ORAL

## 2014-02-14 MED ORDER — NITROGLYCERIN 0.4 MG SL SUBL
0.4000 mg | SUBLINGUAL_TABLET | Freq: Once | SUBLINGUAL | Status: AC
  Administered 2014-02-14: 0.4 mg via SUBLINGUAL
  Filled 2014-02-14: qty 1

## 2014-02-14 MED ORDER — PAROXETINE HCL 30 MG PO TABS
30.0000 mg | ORAL_TABLET | Freq: Every day | ORAL | Status: DC
  Administered 2014-02-15 – 2014-02-16 (×2): 30 mg via ORAL
  Filled 2014-02-14 (×2): qty 1

## 2014-02-14 MED ORDER — NITROGLYCERIN 0.4 MG SL SUBL: 0.4000 mg | SUBLINGUAL_TABLET | SUBLINGUAL | Status: DC | PRN

## 2014-02-14 NOTE — ED Notes (Signed)
Pt alert and oriented x4. Respirations even and unlabored, bilateral symmetrical rise and fall of chest. Skin warm and dry. In no acute distress. Denies needs.   

## 2014-02-14 NOTE — H&P (Signed)
[facility] History and Physical  [name] KDT:[id number] DOB: 1945-06-14 DOA: 02/14/2014  Referring physician: EDP PCP: Reginia Naas, MD   Chief Complaint: chest pain  HPI: [name] is a 68 y.o. female with multiple medical problems including CAD, esophageal strictures, hypertension, renal stones s/p lithotripsy, two days ago comes in for sub sternal chest pain on eating both solids and liquids. She also reports dysphagia and odynophagia to both liquids and solids and hasn't eaten for 4 days now. On arrival to ED, she was found to be dehydrated, in acute renal failure. She was referred to medical service for the evaluation of dysphagia and odynophagia.    Review of Systems:  Constitutional:  No weight loss, night sweats, Fevers, chills, fatigue.  HEENT:  No headaches, Difficulty swallowing,Tooth/dental problems,Sore throat,  No sneezing, itching, ear ache, nasal congestion, post nasal drip,  Cardio-vascular:  Chest pain, and dizziness.  GI:  Dysphagia, odynophagia, nausea, no vomiting.  Resp:  No shortness of breath with exertion or at rest. No excess mucus, no productive cough, No non-productive cough, No coughing up of blood.No change in color of mucus.No wheezing.No chest wall deformity  Skin:  no rash or lesions.  GU:  no dysuria, change in color of urine, no urgency or frequency. No flank pain.  Musculoskeletal:  No joint pain or swelling. No decreased range of motion. No back pain.  Psych:  No change in mood or affect. No depression or anxiety. No memory loss.   Past Medical History  Diagnosis Date  . Irritable bowel syndrome   . Hypertension   . Depression   . Hypothyroidism   . Internal hemorrhoids   . Anxiety   . Heart murmur   . Hyperlipidemia   . Vitamin D deficiency   . Chronic low back pain   . Carpal tunnel syndrome   . History of non-ST elevation myocardial infarction (NSTEMI)     2011--  S/P PCI WITH SENTING  . S/P drug  eluting coronary stent placement     X3  in 2011, 2012, 2014  . GERD (gastroesophageal reflux disease)   . History of hiatal hernia   . Partial seizure disorder NEUROLOGIST-- DR WILLIS    NOCTURNAL  . History of supraventricular tachycardia     2011-- resolved  . History of esophageal dilatation     FOR STRIUCTURE  . Gross hematuria   . Renal calculus, right   . Arthritis   . Cervical spondylosis without myelopathy   . S/P pericardial cyst excision     02-05-2013  benign  . Coronary artery disease CARDIOLOGIST-  DR Irish Lack    hx NSTEMI inferoposterior 2011 s/p PCI  total x3 DES's  . PONV (postoperative nausea and vomiting)   . History of gout    Past Surgical History  Procedure Laterality Date  . Mediasternotomy N/A 02/05/2013    Procedure: MEDIAN STERNOTOMY;  Surgeon: Ivin Poot, MD;  Location: [facility] OR;  Service: Thoracic;  Laterality: N/A;  . Biopsy of mediastinal mass N/A 02/05/2013    Procedure: RESECTION OF MEDIASTINAL MASS;  Surgeon: Ivin Poot, MD;  Location: [facility] OR;  Service: Thoracic;  Laterality: N/A;  . Ectopic pregnancy surgery  YRS AGO    SALPINGECTOMY  . Coronary angiogram  03/10/2011    Procedure: CORONARY ANGIOGRAM;  Surgeon: Jettie Booze, MD;  Location: [facility] CATH LAB;  Service: Cardiovascular;;  . Left heart catheterization with coronary angiogram N/A 03/14/2012    Procedure: LEFT HEART CATHETERIZATION  WITH CORONARY ANGIOGRAM;  Surgeon: Sueanne Margarita, MD;  Location: [place] CATH LAB;  Service: Cardiovascular;  Laterality: N/A;  Normal LM,  50% pLAD,  70% mLAD,  D2 50-70%, very tortuous LAD,  70-82% ostial LCFX,  50% in-stent restenosis of  dRCA and mRCA  stent and 50-70% ostial PDA,  normal LVSF, ef 60%  . Percutaneous coronary stent intervention (pci-s) N/A 04/03/2012    Procedure: PERCUTANEOUS CORONARY STENT INTERVENTION (PCI-S);  Surgeon: Jettie Booze, MD;  Location: [facility] CATH LAB;  Service: Cardiovascular;  Laterality: N/A;   Successful PCI  mLAD with  2.75x12 Promus stent, postdilated to >0.57mm  . Coronary angioplasty with stent placement  11-22-2009  dr Irish Lack    Acute inferoposterior MI/  ef 60%,  PCI with DES x1 to dRCA,  25%  mLAD  . Coronary angioplasty with stent placement  06-26-2010  dr Daneen Schick    Cutting balloon angioplasty to dRCA with DES x1,  40% mLAD (non-obstructive CAD)  . Transthoracic echocardiogram  11-17-2011    grade I diastolic dysfunction/  ef 55-60%  . Cardiovascular stress test  11-22-2013  dr Irish Lack    normal lexiscan study/  no ischemia/  normal LVF and wall motion/ ef 81%  . Esophagogastroduodenoscopy (egd) with esophageal dilation  05-20-2011  . Colonoscopy  last one 2014  . Needle guided excision breast calcifications Right 08-09-2008   Social History:  reports that she quit smoking about 46 years ago. Her smoking use included Cigarettes. She started smoking about 49 years ago. She has a .9 pack-year smoking history. She has never used smokeless tobacco. She reports that she does not drink alcohol or use illicit drugs.  No Known Allergies  Family History  Problem Relation Age of Onset  . Breast cancer Sister   . Cancer Sister     breast  . Breast cancer      niece  . Heart disease Father   . Hypertension Father   . Emphysema Father   . Coronary artery disease Father   . Colon cancer Neg Hx   . Heart attack Mother   . CVA Mother      Prior to Admission medications   Medication Sig Start Date End Date Taking? Authorizing Provider  ALPRAZolam (XANAX) 0.25 MG tablet Take 0.25 mg by mouth at bedtime.    Yes Historical Provider, MD  aspirin EC 81 MG tablet Take 81 mg by mouth daily.   Yes Historical Provider, MD  calcitRIOL (ROCALTROL) 0.5 MCG capsule Take 0.5 mcg by mouth every morning.    Yes Historical Provider, MD  Calcium Carbonate-Vitamin D (CALCIUM + D PO) Take 1 tablet by mouth daily.   Yes Historical Provider, MD  ciprofloxacin (CIPRO) 500 MG tablet Take 500 mg by mouth 2 (two) times  daily. Not sure of dosage amount   Yes Historical Provider, MD  clopidogrel (PLAVIX) 75 MG tablet Take 1 tablet (75 mg total) by mouth daily with breakfast. 09/19/13  Yes Sueanne Margarita, MD  diltiazem (MATZIM LA) 240 MG 24 hr tablet Take 240 mg by mouth every morning.    Yes Historical Provider, MD  dimenhyDRINATE (DRAMAMINE) 50 MG tablet Take 25 mg by mouth at bedtime as needed.   Yes Historical Provider, MD  doxazosin (CARDURA) 2 MG tablet Take 1 tablet (2 mg total) by mouth daily. Patient taking differently: Take 2 mg by mouth every morning.  01/28/14  Yes Sueanne Margarita, MD  HYDROcodone-acetaminophen (NORCO/VICODIN) 5-325 MG per tablet Take  1-2 tablets by mouth every 4 (four) hours as needed for moderate pain.   Yes Historical Provider, MD  isosorbide mononitrate (IMDUR) 60 MG 24 hr tablet Take 1.5 tablets (90 mg total) by mouth daily. Patient taking differently: Take 90 mg by mouth every morning.  01/28/14  Yes Sueanne Margarita, MD  levETIRAcetam (KEPPRA) 500 MG tablet Take 1 tablet (500 mg total) by mouth 2 (two) times daily. 07/26/13  Yes Kathrynn Ducking, MD  levothyroxine (SYNTHROID, LEVOTHROID) 100 MCG tablet Take 100 mcg by mouth daily before breakfast.    Yes Historical Provider, MD  losartan (COZAAR) 100 MG tablet Take 100 mg by mouth every morning.    Yes Historical Provider, MD  metoprolol succinate (TOPROL-XL) 50 MG 24 hr tablet TAKE 1 TABLET (50 MG TOTAL) BY MOUTH DAILY. TAKE WITH OR IMMEDIATELY FOLLOWING A MEAL. Patient taking differently: TAKE 1 TABLET (50 MG TOTAL) BY MOUTH DAILY. TAKE WITH OR IMMEDIATELY FOLLOWING A MEAL.---  takes in am 12/03/13  Yes Sueanne Margarita, MD  nitroGLYCERIN (NITROSTAT) 0.4 MG SL tablet Place 0.4 mg under the tongue every 5 (five) minutes as needed for chest pain.   Yes Historical Provider, MD  ondansetron (ZOFRAN) 4 MG tablet Take 1 tablet (4 mg total) by mouth every 6 (six) hours. 02/10/14  Yes Dorie Rank, MD  oxyCODONE-acetaminophen (PERCOCET/ROXICET) 5-325  MG per tablet Take 1-2 tablets by mouth every 6 (six) hours as needed. 02/10/14  Yes Dorie Rank, MD  pantoprazole (PROTONIX) 40 MG tablet TAKE 1 TABLET (40 MG TOTAL) BY MOUTH 2 (TWO) TIMES DAILY. 02/11/14  Yes Irene Shipper, MD  PARoxetine (PAXIL) 30 MG tablet Take 30 mg by mouth daily. 11/17/13  Yes Historical Provider, MD  potassium chloride SA (K-DUR,KLOR-CON) 20 MEQ tablet Take 20 mEq by mouth 2 (two) times daily.   Yes Historical Provider, MD  rosuvastatin (CRESTOR) 20 MG tablet Take 20 mg by mouth every morning.    Yes Historical Provider, MD   Physical Exam: Filed Vitals:   02/14/14 1313 02/14/14 1315 02/14/14 1507 02/14/14 1600  BP:  136/80 141/69 148/76  Pulse:  71 70 72  Temp:      TempSrc:      Resp:  16 16 16   SpO2: 93%  95% 96%    Wt Readings from Last 3 Encounters:  02/13/14 68.04 kg (150 lb)  02/06/14 65.318 kg (144 lb)  12/05/13 61.689 kg (136 lb)    General:  Appears calm and comfortable Eyes: PERRL, normal lids, irises & conjunctiva ENT: grossly normal hearing, lips & tongue Neck: no LAD, masses or thyromegaly Cardiovascular: RRR, no m/r/g. No LE edema. Telemetry: SR, no arrhythmias  Respiratory: CTA bilaterally, no w/r/r. Normal respiratory effort. Abdomen: soft, ntnd Skin: no rash or induration seen on limited exam Musculoskeletal: grossly normal tone BUE/BLE Psychiatric: grossly normal mood and affect, speech fluent and appropriate Neurologic: grossly non-focal.          Labs on Admission:  Basic Metabolic Panel:  Recent Labs Lab 02/10/14 1931 02/14/14 1203  NA 141 140  K 3.5 4.8  CL 107 110  CO2 26 21  GLUCOSE 125* 136*  BUN 17 27*  CREATININE 1.53* 2.56*  CALCIUM 9.7 10.9*   Liver Function Tests:  Recent Labs Lab 02/10/14 1931  AST 20  ALT 13  ALKPHOS 71  BILITOT 0.6  PROT 6.7  ALBUMIN 3.7    Recent Labs Lab 02/10/14 1931  LIPASE 26   No results for input(s):  AMMONIA in the last 168 hours. CBC:  Recent Labs Lab  02/10/14 1931 02/14/14 1203  WBC 11.9* 13.6*  NEUTROABS 8.4*  --   HGB 11.4* 10.3*  HCT 34.1* 31.5*  MCV 91.9 93.5  PLT 235 231   Cardiac Enzymes: No results for input(s): CKTOTAL, CKMB, CKMBINDEX, TROPONINI in the last 168 hours.  BNP (last 3 results)  Recent Labs  02/14/14 1203  BNP 793.2*    ProBNP (last 3 results) No results for input(s): PROBNP in the last 8760 hours.  CBG: No results for input(s): GLUCAP in the last 168 hours.  Radiological Exams on Admission: Dg Chest 2 View  02/14/2014   CLINICAL DATA:  Chest pain and shortness of breath postoperatively  EXAM: CHEST  2 VIEW  COMPARISON:  02/06/2014 chest CT  FINDINGS: Stable postoperative appearance of the mediastinum, with right diaphragm elevation. There is chronic cardiomegaly without failure.  Chronic mild scarring in the perihilar lung.  IMPRESSION: Stable exam.  No acute cardiopulmonary disease.   Electronically Signed   By: Jorje Guild M.D.   On: 02/14/2014 13:49    EKG: sinus rhythm  Assessment/Plan Active Problems:   Chest pain   Renal insufficiency  Substernal chest pain: Probably non cardiac, as she reports pain on eating and drinking.  But since she has a h/o CAD, we will admit to tele and evaluate for ACS.  ekg looks unremarkable.  Gastroenterology consulted for dysphagia and odynophagia.   Hypertension: Controlled. Resume home meds except for ARB.   Acute renal failure: probably from dehydration her baseline creatinine is around 1.4 and today is 2.56. Started on normal saline. Repeat renal parameters in am. Check urine output .    Hematuria: Probably related to recent lithotripsy. If it doesn't clear up with hydration. Recommend calling the urologist in am. Holding lovneox and aspirin for the next 24 hours.      Code Status: full code.  DVT Prophylaxis: Family Communication: discussed with husband at bedside.  Disposition Plan: admit to telemetry  Time spent: 55  min  [facility] Pager [phone]

## 2014-02-14 NOTE — ED Notes (Signed)
Pt states that she had a kidney stone removed yesterday by dr. Risa Grill.  States that this morning she woke up with central cp, sob, NV.

## 2014-02-14 NOTE — ED Provider Notes (Signed)
CSN: [id number]     Arrival date & time 02/14/14  1128 History   First MD Initiated Contact with Patient 02/14/14 1219     Chief Complaint  Patient presents with  . Post-op Problem  . Chest Pain  . Shortness of Breath     (Consider location/radiation/quality/duration/timing/severity/associated sxs/prior Treatment) The history is provided by the patient.   patient presents with chest pain. Starts in her mid chest and works as way out. States she's been feeling food get stuck. She had a lithotripsy and stenting yesterday states the pain is been proceeding well. She's also had some shortness of breath that also proceeded the event. States the chest pain does not feel like her previous heart issues. She states she's had had her esophagus stretched in the past. She said her flank pain is improved. She also at times states the pain will go from her chest down and at times it goes up. States she's had decreased oral intake because of the pain.  Past Medical History  Diagnosis Date  . Irritable bowel syndrome   . Hypertension   . Depression   . Hypothyroidism   . Internal hemorrhoids   . Anxiety   . Heart murmur   . Hyperlipidemia   . Vitamin D deficiency   . Chronic low back pain   . Carpal tunnel syndrome   . History of non-ST elevation myocardial infarction (NSTEMI)     2011--  S/P PCI WITH SENTING  . S/P drug eluting coronary stent placement     X3  in 2011, 2012, 2014  . GERD (gastroesophageal reflux disease)   . History of hiatal hernia   . Partial seizure disorder NEUROLOGIST-- DR WILLIS    NOCTURNAL  . History of supraventricular tachycardia     2011-- resolved  . History of esophageal dilatation     FOR STRIUCTURE  . Gross hematuria   . Renal calculus, right   . Arthritis   . Cervical spondylosis without myelopathy   . S/P pericardial cyst excision     02-05-2013  benign  . Coronary artery disease CARDIOLOGIST-  DR Irish Lack    hx NSTEMI inferoposterior 2011 s/p PCI   total x3 DES's  . PONV (postoperative nausea and vomiting)   . History of gout    Past Surgical History  Procedure Laterality Date  . Mediasternotomy N/A 02/05/2013    Procedure: MEDIAN STERNOTOMY;  Surgeon: Ivin Poot, MD;  Location: [facility] OR;  Service: Thoracic;  Laterality: N/A;  . Biopsy of mediastinal mass N/A 02/05/2013    Procedure: RESECTION OF MEDIASTINAL MASS;  Surgeon: Ivin Poot, MD;  Location: [facility] OR;  Service: Thoracic;  Laterality: N/A;  . Ectopic pregnancy surgery  YRS AGO    SALPINGECTOMY  . Coronary angiogram  03/10/2011    Procedure: CORONARY ANGIOGRAM;  Surgeon: Jettie Booze, MD;  Location: [facility] CATH LAB;  Service: Cardiovascular;;  . Left heart catheterization with coronary angiogram N/A 03/14/2012    Procedure: LEFT HEART CATHETERIZATION WITH CORONARY ANGIOGRAM;  Surgeon: Sueanne Margarita, MD;  Location: [place] CATH LAB;  Service: Cardiovascular;  Laterality: N/A;  Normal LM,  50% pLAD,  70% mLAD,  D2 50-70%, very tortuous LAD,  70-82% ostial LCFX,  50% in-stent restenosis of  dRCA and mRCA  stent and 50-70% ostial PDA,  normal LVSF, ef 60%  . Percutaneous coronary stent intervention (pci-s) N/A 04/03/2012    Procedure: PERCUTANEOUS CORONARY STENT INTERVENTION (PCI-S);  Surgeon: Jettie Booze, MD;  Location:  [place] CATH LAB;  Service: Cardiovascular;  Laterality: N/A;   Successful PCI  mLAD with 2.75x12 Promus stent, postdilated to >0.53mm  . Coronary angioplasty with stent placement  11-22-2009  dr Irish Lack    Acute inferoposterior MI/  ef 60%,  PCI with DES x1 to dRCA,  25%  mLAD  . Coronary angioplasty with stent placement  06-26-2010  dr Daneen Schick    Cutting balloon angioplasty to dRCA with DES x1,  40% mLAD (non-obstructive CAD)  . Transthoracic echocardiogram  11-17-2011    grade I diastolic dysfunction/  ef 55-60%  . Cardiovascular stress test  11-22-2013  dr Irish Lack    normal lexiscan study/  no ischemia/  normal LVF and wall motion/ ef 81%  .  Esophagogastroduodenoscopy (egd) with esophageal dilation  05-20-2011  . Colonoscopy  last one 2014  . Needle guided excision breast calcifications Right 08-09-2008   Family History  Problem Relation Age of Onset  . Breast cancer Sister   . Cancer Sister     breast  . Breast cancer      niece  . Heart disease Father   . Hypertension Father   . Emphysema Father   . Coronary artery disease Father   . Colon cancer Neg Hx   . Heart attack Mother   . CVA Mother    History  Substance Use Topics  . Smoking status: Former Smoker -- 0.30 packs/day for 3 years    Types: Cigarettes    Start date: 11/08/1964    Quit date: 11/09/1967  . Smokeless tobacco: Never Used  . Alcohol Use: No   OB History    No data available     Review of Systems  Constitutional: Negative for fever, activity change, appetite change and fatigue.  Eyes: Negative for pain.  Respiratory: Positive for shortness of breath. Negative for chest tightness.   Cardiovascular: Positive for chest pain. Negative for leg swelling.  Gastrointestinal: Positive for abdominal pain. Negative for nausea, vomiting and diarrhea.  Genitourinary: Negative for flank pain.  Musculoskeletal: Positive for back pain. Negative for neck stiffness.  Skin: Negative for rash.  Neurological: Negative for weakness, numbness and headaches.  Psychiatric/Behavioral: Negative for behavioral problems.      Allergies  Review of patient's allergies indicates no known allergies.  Home Medications   Prior to Admission medications   Medication Sig Start Date End Date Taking? Authorizing Provider  ALPRAZolam (XANAX) 0.25 MG tablet Take 0.25 mg by mouth at bedtime.    Yes Historical Provider, MD  aspirin EC 81 MG tablet Take 81 mg by mouth daily.   Yes Historical Provider, MD  calcitRIOL (ROCALTROL) 0.5 MCG capsule Take 0.5 mcg by mouth every morning.    Yes Historical Provider, MD  Calcium Carbonate-Vitamin D (CALCIUM + D PO) Take 1 tablet by  mouth daily.   Yes Historical Provider, MD  ciprofloxacin (CIPRO) 500 MG tablet Take 500 mg by mouth 2 (two) times daily. Not sure of dosage amount   Yes Historical Provider, MD  clopidogrel (PLAVIX) 75 MG tablet Take 1 tablet (75 mg total) by mouth daily with breakfast. 09/19/13  Yes Sueanne Margarita, MD  diltiazem (MATZIM LA) 240 MG 24 hr tablet Take 240 mg by mouth every morning.    Yes Historical Provider, MD  dimenhyDRINATE (DRAMAMINE) 50 MG tablet Take 25 mg by mouth at bedtime as needed.   Yes Historical Provider, MD  doxazosin (CARDURA) 2 MG tablet Take 1 tablet (2 mg total) by mouth daily.  Patient taking differently: Take 2 mg by mouth every morning.  01/28/14  Yes Sueanne Margarita, MD  HYDROcodone-acetaminophen (NORCO/VICODIN) 5-325 MG per tablet Take 1-2 tablets by mouth every 4 (four) hours as needed for moderate pain.   Yes Historical Provider, MD  isosorbide mononitrate (IMDUR) 60 MG 24 hr tablet Take 1.5 tablets (90 mg total) by mouth daily. Patient taking differently: Take 90 mg by mouth every morning.  01/28/14  Yes Sueanne Margarita, MD  levETIRAcetam (KEPPRA) 500 MG tablet Take 1 tablet (500 mg total) by mouth 2 (two) times daily. 07/26/13  Yes Kathrynn Ducking, MD  levothyroxine (SYNTHROID, LEVOTHROID) 100 MCG tablet Take 100 mcg by mouth daily before breakfast.    Yes Historical Provider, MD  losartan (COZAAR) 100 MG tablet Take 100 mg by mouth every morning.    Yes Historical Provider, MD  metoprolol succinate (TOPROL-XL) 50 MG 24 hr tablet TAKE 1 TABLET (50 MG TOTAL) BY MOUTH DAILY. TAKE WITH OR IMMEDIATELY FOLLOWING A MEAL. Patient taking differently: TAKE 1 TABLET (50 MG TOTAL) BY MOUTH DAILY. TAKE WITH OR IMMEDIATELY FOLLOWING A MEAL.---  takes in am 12/03/13  Yes Sueanne Margarita, MD  nitroGLYCERIN (NITROSTAT) 0.4 MG SL tablet Place 0.4 mg under the tongue every 5 (five) minutes as needed for chest pain.   Yes Historical Provider, MD  ondansetron (ZOFRAN) 4 MG tablet Take 1 tablet (4 mg  total) by mouth every 6 (six) hours. 02/10/14  Yes Dorie Rank, MD  oxyCODONE-acetaminophen (PERCOCET/ROXICET) 5-325 MG per tablet Take 1-2 tablets by mouth every 6 (six) hours as needed. 02/10/14  Yes Dorie Rank, MD  pantoprazole (PROTONIX) 40 MG tablet TAKE 1 TABLET (40 MG TOTAL) BY MOUTH 2 (TWO) TIMES DAILY. 02/11/14  Yes Irene Shipper, MD  PARoxetine (PAXIL) 30 MG tablet Take 30 mg by mouth daily. 11/17/13  Yes Historical Provider, MD  potassium chloride SA (K-DUR,KLOR-CON) 20 MEQ tablet Take 20 mEq by mouth 2 (two) times daily.   Yes Historical Provider, MD  rosuvastatin (CRESTOR) 20 MG tablet Take 20 mg by mouth every morning.    Yes Historical Provider, MD   BP 148/76 mmHg  Pulse 72  Temp(Src) 98.2 F (36.8 C) (Oral)  Resp 16  SpO2 96% Physical Exam  Constitutional: She is oriented to person, place, and time. She appears well-developed and well-nourished.  HENT:  Head: Normocephalic and atraumatic.  Eyes: EOM are normal. Pupils are equal, round, and reactive to light.  Neck: Normal range of motion. Neck supple.  Cardiovascular: Normal rate, regular rhythm and normal heart sounds.   No murmur heard. Pulmonary/Chest: Effort normal and breath sounds normal. No respiratory distress. She has no wheezes. She has no rales. She exhibits tenderness.  Lower chest and some epigastric tenderness. Tenderness along the sternum also.  Abdominal: Soft. Bowel sounds are normal. She exhibits no distension. There is tenderness. There is no rebound and no guarding.  Some epigastric tenderness  Musculoskeletal: Normal range of motion.  Neurological: She is alert and oriented to person, place, and time. No cranial nerve deficit.  Skin: Skin is warm and dry.  Psychiatric: She has a normal mood and affect. Her speech is normal.  Nursing note and vitals reviewed.   ED Course  Procedures (including critical care time) Labs Review Labs Reviewed  CBC - Abnormal; Notable for the following:    WBC 13.6 (*)     RBC 3.37 (*)    Hemoglobin 10.3 (*)    HCT 31.5 (*)  All other components within normal limits  BASIC METABOLIC PANEL - Abnormal; Notable for the following:    Glucose, Bld 136 (*)    BUN 27 (*)    Creatinine, Ser 2.56 (*)    Calcium 10.9 (*)    GFR calc non Af Amer 18 (*)    GFR calc Af Amer 21 (*)    All other components within normal limits  BRAIN NATRIURETIC PEPTIDE - Abnormal; Notable for the following:    B Natriuretic Peptide 793.2 (*)    All other components within normal limits  URINALYSIS, ROUTINE W REFLEX MICROSCOPIC  I-STAT TROPOININ, ED    Imaging Review Dg Chest 2 View  02/14/2014   CLINICAL DATA:  Chest pain and shortness of breath postoperatively  EXAM: CHEST  2 VIEW  COMPARISON:  02/06/2014 chest CT  FINDINGS: Stable postoperative appearance of the mediastinum, with right diaphragm elevation. There is chronic cardiomegaly without failure.  Chronic mild scarring in the perihilar lung.  IMPRESSION: Stable exam.  No acute cardiopulmonary disease.   Electronically Signed   By: Jorje Guild M.D.   On: 02/14/2014 13:49     EKG Interpretation   Date/Time:  Thursday February 14 2014 11:41:28 EST Ventricular Rate:  71 PR Interval:  155 QRS Duration: 88 QT Interval:  377 QTC Calculation: 410 R Axis:   -32 Text Interpretation:  Sinus rhythm Left axis deviation Low voltage,  precordial leads Consider anterior infarct Baseline wander in lead(s) I II  aVR V1 V4 Confirmed by Roni Scow  MD, Johnnae Impastato [phone]) on 02/14/2014 4:58:06  PM      MDM   Final diagnoses:  Renal insufficiency  Chest pain, unspecified chest pain type    Patient with chest pain. EKG reassuring and enzymes are negative. X-ray is reassuring. White count is mildly elevated. Creatinine is increased since most recent previous value. Chest pain may be due to esophageal stricture or abnormality. She has had this in the past. Will admit to internal medicine.    Jasper Riling. Alvino Chapel, MD 02/14/14 1700

## 2014-02-14 NOTE — Anesthesia Postprocedure Evaluation (Signed)
  Anesthesia Post-op Note  Patient: [name]  Procedure(s) Performed: Procedure(s) (LRB): CYSTOSCOPY WITH RETROGRADE PYELOGRAM, URETEROSCOPY AND STENT PLACEMENT (Right) HOLMIUM LASER APPLICATION (Right)  Patient Location: PACU  Anesthesia Type: General  Level of Consciousness: awake and alert   Airway and Oxygen Therapy: Patient Spontanous Breathing  Post-op Pain: mild  Post-op Assessment: Post-op Vital signs reviewed, Patient's Cardiovascular Status Stable, Respiratory Function Stable, Patent Airway and No signs of Nausea or vomiting  Last Vitals:  Filed Vitals:   02/13/14 1454  BP: 145/73  Pulse: 58  Temp: 36.9 C  Resp: 14    Post-op Vital Signs: stable   Complications: No apparent anesthesia complications

## 2014-02-14 NOTE — Progress Notes (Signed)
Utilization Review completed.  Rip Hawes RN CM  

## 2014-02-15 ENCOUNTER — Encounter (HOSPITAL_BASED_OUTPATIENT_CLINIC_OR_DEPARTMENT_OTHER): Payer: Self-pay | Admitting: Urology

## 2014-02-15 ENCOUNTER — Encounter (HOSPITAL_COMMUNITY)
Admission: EM | Disposition: A | Discharge status: Home Health | Payer: Self-pay | Source: Home / Self Care | Attending: Family Medicine

## 2014-02-15 DIAGNOSIS — K219 Gastro-esophageal reflux disease without esophagitis: Secondary | ICD-10-CM

## 2014-02-15 DIAGNOSIS — N179 Acute kidney failure, unspecified: Secondary | ICD-10-CM

## 2014-02-15 DIAGNOSIS — Z8719 Personal history of other diseases of the digestive system: Secondary | ICD-10-CM | POA: Insufficient documentation

## 2014-02-15 DIAGNOSIS — D72829 Elevated white blood cell count, unspecified: Secondary | ICD-10-CM

## 2014-02-15 DIAGNOSIS — K224 Dyskinesia of esophagus: Secondary | ICD-10-CM

## 2014-02-15 DIAGNOSIS — R1310 Dysphagia, unspecified: Secondary | ICD-10-CM

## 2014-02-15 DIAGNOSIS — D500 Iron deficiency anemia secondary to blood loss (chronic): Secondary | ICD-10-CM

## 2014-02-15 HISTORY — PX: ESOPHAGOGASTRODUODENOSCOPY: SHX5428

## 2014-02-15 LAB — BASIC METABOLIC PANEL
ANION GAP: 4 — AB (ref 5–15)
BUN: 30 mg/dL — ABNORMAL HIGH (ref 6–23)
CALCIUM: 9.7 mg/dL (ref 8.4–10.5)
CO2: 21 mmol/L (ref 19–32)
CREATININE: 2.28 mg/dL — AB (ref 0.50–1.10)
Chloride: 115 mmol/L — ABNORMAL HIGH (ref 96–112)
GFR calc Af Amer: 24 mL/min — ABNORMAL LOW (ref >90)
GFR, EST NON AFRICAN AMERICAN: 21 mL/min — AB (ref >90)
Glucose, Bld: 136 mg/dL — ABNORMAL HIGH (ref 70–99)
POTASSIUM: 4.5 mmol/L (ref 3.5–5.1)
Sodium: 140 mmol/L (ref 135–145)

## 2014-02-15 LAB — CBC
HEMATOCRIT: 27.7 % — AB (ref 36.0–46.0)
HEMOGLOBIN: 9.1 g/dL — AB (ref 12.0–15.0)
MCH: 30.6 pg (ref 26.0–34.0)
MCHC: 32.9 g/dL (ref 30.0–36.0)
MCV: 93.3 fL (ref 78.0–100.0)
PLATELETS: 223 10*3/uL (ref 150–400)
RBC: 2.97 MIL/uL — ABNORMAL LOW (ref 3.87–5.11)
RDW: 12.8 % (ref 11.5–15.5)
WBC: 12.2 10*3/uL — ABNORMAL HIGH (ref 4.0–10.5)

## 2014-02-15 LAB — TROPONIN I
Troponin I: <0.03 ng/mL (ref <0.031)
Troponin I: <0.03 ng/mL (ref <0.031)

## 2014-02-15 SURGERY — EGD (ESOPHAGOGASTRODUODENOSCOPY)
Anesthesia: Moderate Sedation

## 2014-02-15 MED ORDER — OXYBUTYNIN CHLORIDE 5 MG PO TABS
5.0000 mg | ORAL_TABLET | Freq: Three times a day (TID) | ORAL | Status: DC
  Administered 2014-02-15 – 2014-02-16 (×4): 5 mg via ORAL
  Filled 2014-02-15 (×4): qty 1

## 2014-02-15 MED ORDER — BUTAMBEN-TETRACAINE-BENZOCAINE 2-2-14 % EX AERO
INHALATION_SPRAY | CUTANEOUS | Status: DC | PRN
  Administered 2014-02-15: 1 via TOPICAL

## 2014-02-15 MED ORDER — SUCRALFATE 1 GM/10ML PO SUSP
1.0000 g | Freq: Three times a day (TID) | ORAL | Status: DC
  Administered 2014-02-15 – 2014-02-16 (×5): 1 g via ORAL
  Filled 2014-02-15 (×5): qty 10

## 2014-02-15 MED ORDER — FENTANYL CITRATE 0.05 MG/ML IJ SOLN
INTRAMUSCULAR | Status: DC | PRN
  Administered 2014-02-15 (×3): 25 ug via INTRAVENOUS

## 2014-02-15 MED ORDER — DIPHENHYDRAMINE HCL 50 MG/ML IJ SOLN
INTRAMUSCULAR | Status: AC
  Filled 2014-02-15: qty 1

## 2014-02-15 MED ORDER — MIDAZOLAM HCL 10 MG/2ML IJ SOLN
INTRAMUSCULAR | Status: DC | PRN
  Administered 2014-02-15: 1 mg via INTRAVENOUS
  Administered 2014-02-15 (×3): 2 mg via INTRAVENOUS

## 2014-02-15 MED ORDER — MIDAZOLAM HCL 10 MG/2ML IJ SOLN
INTRAMUSCULAR | Status: AC
  Filled 2014-02-15: qty 2

## 2014-02-15 MED ORDER — FENTANYL CITRATE 0.05 MG/ML IJ SOLN
INTRAMUSCULAR | Status: AC
  Filled 2014-02-15: qty 2

## 2014-02-15 NOTE — Progress Notes (Signed)
PT Cancellation Note  Patient Details Name: [name] MRN: [id number] DOB: Aug 03, 1945   Cancelled Treatment:    Reason Eval/Treat Not Completed: Patient at procedure or test/unavailable. Will follow.    Blondell Reveal Kistler 02/15/2014, 12:04 PM [phone]

## 2014-02-15 NOTE — Progress Notes (Signed)
INITIAL NUTRITION ASSESSMENT  DOCUMENTATION CODES Per approved criteria  -Not Applicable   INTERVENTION: Continue Ensure Complete po BID, each supplement provides 350 kcal and 13 grams of protein Encourage PO intake  NUTRITION DIAGNOSIS: Inadequate oral intake related to poor appetite as evidenced by poor PO intake x 4 days.   Goal: Pt to meet >/= 90% of their estimated nutrition needs   Monitor:  PO and supplemental intake, weight, labs, I/O's  Reason for Assessment: Pt identified as at nutrition risk on the Malnutrition Screen Tool  Admitting Dx: chest pain  ASSESSMENT: 69 y.o. female multiple medical problems. She is known to [place] for a history of anemia, GERD, and chronic dysphagia secondary to a combination of stricture and dysmotility. Patient has had multiple esophageal dilations through the years, last one May 2013.  Pt in room with husband at bedside. Pt appeared drowsy, husband provided most history. Per husband, since Sunday pt has eaten very little, reports pt eating eggs and soup and crackers one day. Since then pt has had nothing to eat.  Pt on FL diet, PO 100%. Pt ordered lunch tray before visit.   Per weight history, pt's weight has fluctuated slightly but overall is around 140 lb.  Pt reports poor appetite. Pt is willing to sip on Ensure supplements.   Nutrition focused physical exam shows no sign of depletion of muscle mass or body fat.  Labs reviewed: Elevated BUN & Creatinine  Height: Ht Readings from Last 1 Encounters:  02/14/14 4\' 11"  (1.499 m)    Weight: Wt Readings from Last 1 Encounters:  02/14/14 142 lb (64.411 kg)    Ideal Body Weight: 98 lb  % Ideal Body Weight: 144%  Wt Readings from Last 10 Encounters:  02/14/14 142 lb (64.411 kg)  02/13/14 150 lb (68.04 kg)  02/06/14 144 lb (65.318 kg)  12/05/13 136 lb (61.689 kg)  11/22/13 136 lb (61.689 kg)  11/20/13 142 lb 1.9 oz (64.465 kg)  11/16/13 141 lb 3.2 oz (64.048 kg)  07/26/13 143 lb  (64.864 kg)  05/24/13 143 lb (64.864 kg)  04/18/13 144 lb (65.318 kg)    Usual Body Weight: 140-142 lb -per pt's husband  % Usual Body Weight: 100%  BMI:  Body mass index is 28.67 kg/(m^2).  Estimated Nutritional Needs: Kcal: 1600-1800 Protein: 75-85g Fluid: 1.6L/day  Skin: perineum incision  Diet Order: Diet full liquid  EDUCATION NEEDS: -No education needs identified at this time   Intake/Output Summary (Last 24 hours) at 02/15/14 1533 Last data filed at 02/15/14 1529  Gross per 24 hour  Intake 2048.33 ml  Output      0 ml  Net 2048.33 ml    Last BM: 2/4  Labs:   Recent Labs Lab 02/10/14 1931 02/14/14 1203 02/15/14 0200  NA 141 140 140  K 3.5 4.8 4.5  CL 107 110 115*  CO2 26 21 21   BUN 17 27* 30*  CREATININE 1.53* 2.56* 2.28*  CALCIUM 9.7 10.9* 9.7  GLUCOSE 125* 136* 136*    CBG (last 3)  No results for input(s): GLUCAP in the last 72 hours.  Scheduled Meds: . ALPRAZolam  0.25 mg Oral QHS  . ciprofloxacin  500 mg Oral Q breakfast  . clopidogrel  75 mg Oral Q breakfast  . diltiazem  240 mg Oral q morning - 10a  . feeding supplement (ENSURE COMPLETE)  237 mL Oral BID BM  . levETIRAcetam  500 mg Oral BID  . levothyroxine  100 mcg Oral QAC  breakfast  . metoprolol succinate  50 mg Oral Daily  . pantoprazole  40 mg Oral BID AC  . PARoxetine  30 mg Oral Daily  . rosuvastatin  20 mg Oral q morning - 10a  . sucralfate  1 g Oral TID WC & HS    Continuous Infusions: . sodium chloride 100 mL/hr at 02/15/14 0415    Past Medical History  Diagnosis Date  . Irritable bowel syndrome   . Hypertension   . Depression   . Hypothyroidism   . Internal hemorrhoids   . Anxiety   . Heart murmur   . Hyperlipidemia   . Vitamin D deficiency   . Chronic low back pain   . Carpal tunnel syndrome   . History of non-ST elevation myocardial infarction (NSTEMI)     2011--  S/P PCI WITH SENTING  . S/P drug eluting coronary stent placement     X3  in 2011, 2012,  2014  . GERD (gastroesophageal reflux disease)   . History of hiatal hernia   . Partial seizure disorder NEUROLOGIST-- DR WILLIS    NOCTURNAL  . History of supraventricular tachycardia     2011-- resolved  . History of esophageal dilatation     FOR STRIUCTURE  . Gross hematuria   . Renal calculus, right   . Arthritis   . Cervical spondylosis without myelopathy   . S/P pericardial cyst excision     02-05-2013  benign  . Coronary artery disease CARDIOLOGIST-  DR Irish Lack    hx NSTEMI inferoposterior 2011 s/p PCI  total x3 DES's  . PONV (postoperative nausea and vomiting)   . History of gout     Past Surgical History  Procedure Laterality Date  . Mediasternotomy N/A 02/05/2013    Procedure: MEDIAN STERNOTOMY;  Surgeon: Ivin Poot, MD;  Location: [facility] OR;  Service: Thoracic;  Laterality: N/A;  . Biopsy of mediastinal mass N/A 02/05/2013    Procedure: RESECTION OF MEDIASTINAL MASS;  Surgeon: Ivin Poot, MD;  Location: [facility] OR;  Service: Thoracic;  Laterality: N/A;  . Ectopic pregnancy surgery  YRS AGO    SALPINGECTOMY  . Coronary angiogram  03/10/2011    Procedure: CORONARY ANGIOGRAM;  Surgeon: Jettie Booze, MD;  Location: [facility] CATH LAB;  Service: Cardiovascular;;  . Left heart catheterization with coronary angiogram N/A 03/14/2012    Procedure: LEFT HEART CATHETERIZATION WITH CORONARY ANGIOGRAM;  Surgeon: Sueanne Margarita, MD;  Location: [place] CATH LAB;  Service: Cardiovascular;  Laterality: N/A;  Normal LM,  50% pLAD,  70% mLAD,  D2 50-70%, very tortuous LAD,  70-82% ostial LCFX,  50% in-stent restenosis of  dRCA and mRCA  stent and 50-70% ostial PDA,  normal LVSF, ef 60%  . Percutaneous coronary stent intervention (pci-s) N/A 04/03/2012    Procedure: PERCUTANEOUS CORONARY STENT INTERVENTION (PCI-S);  Surgeon: Jettie Booze, MD;  Location: [facility] CATH LAB;  Service: Cardiovascular;  Laterality: N/A;   Successful PCI  mLAD with 2.75x12 Promus stent, postdilated to >0.64mm  . Coronary  angioplasty with stent placement  11-22-2009  dr Irish Lack    Acute inferoposterior MI/  ef 60%,  PCI with DES x1 to dRCA,  25%  mLAD  . Coronary angioplasty with stent placement  06-26-2010  dr Daneen Schick    Cutting balloon angioplasty to dRCA with DES x1,  40% mLAD (non-obstructive CAD)  . Transthoracic echocardiogram  11-17-2011    grade I diastolic dysfunction/  ef 55-60%  . Cardiovascular stress test  11-22-2013  dr Irish Lack    normal lexiscan study/  no ischemia/  normal LVF and wall motion/ ef 81%  . Esophagogastroduodenoscopy (egd) with esophageal dilation  05-20-2011  . Colonoscopy  last one 2014  . Needle guided excision breast calcifications Right 08-09-2008  . Cystoscopy with retrograde pyelogram, ureteroscopy and stent placement Right 02/13/2014    Procedure: CYSTOSCOPY WITH RETROGRADE PYELOGRAM, URETEROSCOPY AND STENT PLACEMENT;  Surgeon: Bernestine Amass, MD;  Location: [facility];  Service: Urology;  Laterality: Right;  . Holmium laser application Right 06/19/4852    Procedure: HOLMIUM LASER APPLICATION;  Surgeon: Bernestine Amass, MD;  Location: [facility];  Service: Urology;  Laterality: Right;    Marrow Bibles, MS, RD, LDN Pager: [phone] After Hours Pager: [phone]

## 2014-02-15 NOTE — Discharge Instructions (Signed)
Esophagogastroduodenoscopy °Care After °Refer to this sheet in the next few weeks. These instructions provide you with information on caring for yourself after your procedure. Your caregiver may also give you more specific instructions. Your treatment has been planned according to current medical practices, but problems sometimes occur. Call your caregiver if you have any problems or questions after your procedure.  °HOME CARE INSTRUCTIONS °· Do not eat or drink anything until the numbing medicine (local anesthetic) has worn off and your gag reflex has returned. You will know that the local anesthetic has worn off when you can swallow comfortably. °· Do not drive for 12 hours after the procedure or as directed by your caregiver. °· Only take medicines as directed by your caregiver. °SEEK MEDICAL CARE IF:  °· You cannot stop coughing. °· You are not urinating at all or less than usual. °SEEK IMMEDIATE MEDICAL CARE IF: °· You have difficulty swallowing. °· You cannot eat or drink. °· You have worsening throat or chest pain. °· You have dizziness, lightheadedness, or you faint. °· You have nausea or vomiting. °· You have chills. °· You have a fever. °· You have severe abdominal pain. °· You have black, tarry, or bloody stools. °Document Released: 12/15/2011 Document Reviewed: 12/15/2011 °[employer]® Patient Information ©2015 [employer]. This information is not intended to replace advice given to you by your health care provider. Make sure you discuss any questions you have with your health care provider. ° °

## 2014-02-15 NOTE — Progress Notes (Signed)
[name] WCH:[id number] DOB: 02-09-1945 DOA: 02/14/2014 PCP: Reginia Naas, MD  Brief narrative: 69 y/o ? h/o CAD 03/14/12 s/p PCI to LAD c Promus-stent 3/24, s/p resection Pericardial cyst resection 2015, ASCAD, HTn, HLd, GERD + stricture s/p dilatation, h/o Sz foll by Neurology, Cervical spondylosis. Chronic LBP On 02/13/14, patient underwent a cystoscopy with flexible ureteroscopy and lithotripsy with right retrograde pyelogram because of nephrolithiasis-6 French 24 cm double-J stent was placed and holmium laser therapy was done.  Patient was intubated and did reasonably fair after the procedure but subsequently was not able to heat had difficulty swallowing and represented to the hospital on 02/15/14 with dysphagia, nausea vomiting and inability to eat It was noted patient's kidney function has declined from BUN/creatinine 17/1.53-27/2.56. ProBNP was 793 Troponins have been negative so far    Past medical history-As per Problem list Chart reviewed as below- Reviewed  Consultants:  Gastroenterology Dr. Hilarie Fredrickson  Telephone consulted to/5/16 Dr.Grapey  Procedures:  Endoscopy 02/15/14 = gastritis, Schatzki ring  Antibiotics:  None   Subjective  Sleepy but awakens No specific discomfort or distress Does have some ongoing right-sided tenderness in the abdomen No chest pain no shortness of breath    Objective    Interim History: None  Telemetry: Sinus tach   Objective: Filed Vitals:   02/15/14 1058 02/15/14 1149 02/15/14 1215 02/15/14 1230  BP: 176/85 204/93  161/112  Pulse: 72 75  73  Temp: 98.1 F (36.7 C)   97.9 F (36.6 C)  TempSrc: Oral   Oral  Resp: 17 17  11   Height:      Weight:      SpO2: 95% 96% 100% 100%    Intake/Output Summary (Last 24 hours) at 02/15/14 1243 Last data filed at 02/15/14 0600  Gross per 24 hour  Intake 1928.33 ml  Output      0 ml  Net 1928.33 ml    Exam:  General: EOMI NCAT Cardiovascular: S1-S2 no murmur rub or  gallop Respiratory: Clinically clear no added sound Abdomen: Slightly tender in the right abdomen Skin no lower extremity edema Neuro grossly intact moving all 4 limbs equally  Data Reviewed: Basic Metabolic Panel:  Recent Labs Lab 02/10/14 1931 02/14/14 1203 02/15/14 0200  NA 141 140 140  K 3.5 4.8 4.5  CL 107 110 115*  CO2 26 21 21   GLUCOSE 125* 136* 136*  BUN 17 27* 30*  CREATININE 1.53* 2.56* 2.28*  CALCIUM 9.7 10.9* 9.7   Liver Function Tests:  Recent Labs Lab 02/10/14 1931  AST 20  ALT 13  ALKPHOS 71  BILITOT 0.6  PROT 6.7  ALBUMIN 3.7    Recent Labs Lab 02/10/14 1931  LIPASE 26   No results for input(s): AMMONIA in the last 168 hours. CBC:  Recent Labs Lab 02/10/14 1931 02/14/14 1203 02/15/14 0200  WBC 11.9* 13.6* 12.2*  NEUTROABS 8.4*  --   --   HGB 11.4* 10.3* 9.1*  HCT 34.1* 31.5* 27.7*  MCV 91.9 93.5 93.3  PLT 235 231 223   Cardiac Enzymes:  Recent Labs Lab 02/14/14 2020 02/15/14 0200 02/15/14 0806  TROPONINI <0.03 <0.03 <0.03   BNP: Invalid input(s): POCBNP CBG: No results for input(s): GLUCAP in the last 168 hours.  No results found for this or any previous visit (from the past 240 hour(s)).   Studies:              All Imaging reviewed and is as per above notation  Scheduled Meds: . ALPRAZolam  0.25 mg Oral QHS  . ciprofloxacin  500 mg Oral Q breakfast  . clopidogrel  75 mg Oral Q breakfast  . diltiazem  240 mg Oral q morning - 10a  . feeding supplement (ENSURE COMPLETE)  237 mL Oral BID BM  . levETIRAcetam  500 mg Oral BID  . levothyroxine  100 mcg Oral QAC breakfast  . metoprolol succinate  50 mg Oral Daily  . pantoprazole  40 mg Oral BID AC  . PARoxetine  30 mg Oral Daily  . rosuvastatin  20 mg Oral q morning - 10a  . sucralfate  1 g Oral TID WC & HS   Continuous Infusions: . sodium chloride 100 mL/hr at 02/15/14 0415     Assessment/Plan: 1. Dysphagia-secondary to gastritis DDX pharyngeal edema-appreciate  gastroenterology input. Continue sucralfate 1 g 3 times a day,, pantoprazole 40 twice a day before meals-will need outpatient follow-up with Dr. Henrene Pastor may be in 8 weeks 2. Acute kidney injury-Baseline creatinine =Ck-Glt Equation CKD stg 3.  We will continue IV fluid repletion with saline 100 cc per hour.  Rpt Bmet am 02/16/14. 3. Recent nephrolithiasis and cystoscopy-discussed with Dr. Risa Grill of urology-may start Ditropan 5 3 times a day for spasm-like pains. We will continue hydration. 4. Hematuria-expected secondary to procedure and #3. 5. CAD status post stent 04/03/12-continue Plavix 75 daily-discussion as an outpatient with cardiology regarding if Plavix is still needed long-term vs aSA 81 mg 6. Hypertension continue metoprolol 50 twice a day, Cardizem 240 every morning blood pressure not at goal 7. Seizure disorder-outpatient follow-up with Dr. Jannifer Franklin, continue Keppra 500 twice a day 8. Bipolar-continue Paxil 30 daily, Xanax 0.25 daily at bedtime-may benefit from not being on Ativan long-term 9. Hyperlipidemia continue Crestor 20 every morning 10. Hypothyroidism continue Synthroid 100 g every morning  Code Status: full Family Communication: discussed in detail c family Disposition Plan: inpatient-likely d/c in am to home   Verneita Griffes, MD  [facility] Pager [phone] 02/15/2014, 12:43 PM    LOS: 1 day

## 2014-02-15 NOTE — Op Note (Signed)
[facility] [place], [address]   ENDOSCOPY PROCEDURE REPORT  PATIENT: Jasmine, [name]  MR#: [id number] BIRTHDATE: 05-05-1945 , 76  yrs. old GENDER: female ENDOSCOPIST: Jerene Bears, MD REFERRED BY:  [facility] PROCEDURE DATE:  02/15/2014 PROCEDURE:  EGD, diagnostic ASA CLASS:     Class III INDICATIONS:  odynophagia. MEDICATIONS: Fentanyl 75 mcg IV and Versed 7 mg IV TOPICAL ANESTHETIC: Cetacaine Spray  DESCRIPTION OF PROCEDURE: After the risks benefits and alternatives of the procedure were thoroughly explained, informed consent was obtained.  The Pentax Gastroscope [id number] endoscope was introduced through the mouth and advanced to the second portion of the duodenum , Without limitations.  The instrument was slowly withdrawn as the mucosa was fully examined.   ESOPHAGUS: Esophagitis, most consistent with reflux, was found in the distal esophagus.  No evidence of candida esophagitis.  The proximal and mid-esophageal mucosa appeared normal. Nonobstructing Schatzki's ring.  STOMACH: Multiple sessile polyps (previously seen), likely fundic gland, ranging between 3-40mm in size were found in the cardia, gastric fundus, and gastric body.   The stomach otherwise appeared normal.  DUODENUM: The duodenal mucosa showed no abnormalities in the bulb and 2nd part of the duodenum.  Retroflexed views revealed no abnormalities.     The scope was then withdrawn from the patient and the procedure completed.  COMPLICATIONS: There were no immediate complications.  ENDOSCOPIC IMPRESSION: 1.   Mild esophagitis in the distal esophagus, non-obstructing Schatzki's ring at GE junction 2.   Multiple sessile polyps ranging between 3-12mm in size were found in the cardia, gastric fundus, and gastric body 3.   The stomach otherwise appeared normal 4.   The duodenal mucosa showed no abnormalities in the bulb and 2nd part of the duodenum  RECOMMENDATIONS: 1.   Sucralfate suspension 1 g before meals and at bedtime.  Twice daily PPI for 2-4 weeks 2.  Reflux precautions 3.  Office follow-up if symptoms fail to resolve completely   eSigned:  Jerene Bears, MD 02/15/2014 12:24 PM     CC:The Patient and Scarlette Shorts, MD

## 2014-02-15 NOTE — Consult Note (Signed)
Consultation  Referring Provider: [facility]     Primary Care Physician:  Reginia Naas, MD Primary Gastroenterologist: Scarlette Shorts, MD        Reason for Consultation:   odynophagia          HPI:   [name] is a 69 y.o. female multiple medical problems. She is known to [place] for a history of anemia, GERD, and chronic dysphagia secondary to a combination of stricture and dysmotility. Patient has had multiple esophageal dilations through the years, last one May 2013.   Patient presented to ED yesterday with chest pain. She reported dysphagia but denies that to me. She correlates the chest pain with swallowing and this started acutely yesterday. No recent antibiotics. Troponin are normal.  CT chest unremarkable.    Past Medical History  Diagnosis Date  . Irritable bowel syndrome   . Hypertension   . Depression   . Hypothyroidism   . Internal hemorrhoids   . Anxiety   . Heart murmur   . Hyperlipidemia   . Vitamin D deficiency   . Chronic low back pain   . Carpal tunnel syndrome   . History of non-ST elevation myocardial infarction (NSTEMI)     2011--  S/P PCI WITH SENTING  . S/P drug eluting coronary stent placement     X3  in 2011, 2012, 2014  . GERD (gastroesophageal reflux disease)   . History of hiatal hernia   . Partial seizure disorder NEUROLOGIST-- DR WILLIS    NOCTURNAL  . History of supraventricular tachycardia     2011-- resolved  . History of esophageal dilatation     FOR STRIUCTURE  . Renal calculus, right   . Arthritis   . Cervical spondylosis without myelopathy   . S/P pericardial cyst excision     02-05-2013  benign  . Coronary artery disease CARDIOLOGIST-  DR Irish Lack    hx NSTEMI inferoposterior 2011 s/p PCI  total x3 DES's  . PONV (postoperative nausea and vomiting)   . History of gout     Past Surgical History  Procedure Laterality Date  . Mediasternotomy N/A 02/05/2013    Procedure: MEDIAN STERNOTOMY;  Surgeon: Ivin Poot,  MD;  Location: [facility] OR;  Service: Thoracic;  Laterality: N/A;  . Biopsy of mediastinal mass N/A 02/05/2013    Procedure: RESECTION OF MEDIASTINAL MASS;  Surgeon: Ivin Poot, MD;  Location: [facility] OR;  Service: Thoracic;  Laterality: N/A;  . Ectopic pregnancy surgery  YRS AGO    SALPINGECTOMY  . Coronary angiogram  03/10/2011    Procedure: CORONARY ANGIOGRAM;  Surgeon: Jettie Booze, MD;  Location: [facility] CATH LAB;  Service: Cardiovascular;;  . Left heart catheterization with coronary angiogram N/A 03/14/2012    Procedure: LEFT HEART CATHETERIZATION WITH CORONARY ANGIOGRAM;  Surgeon: Sueanne Margarita, MD;  Location: [facility] CATH LAB;  Service: Cardiovascular;  Laterality: N/A;  Normal LM,  50% pLAD,  70% mLAD,  D2 50-70%, very tortuous LAD,  70-82% ostial LCFX,  50% in-stent restenosis of  dRCA and mRCA  stent and 50-70% ostial PDA,  normal LVSF, ef 60%  . Percutaneous coronary stent intervention (pci-s) N/A 04/03/2012    Procedure: PERCUTANEOUS CORONARY STENT INTERVENTION (PCI-S);  Surgeon: Jettie Booze, MD;  Location: [facility] CATH LAB;  Service: Cardiovascular;  Laterality: N/A;   Successful PCI  mLAD with 2.75x12 Promus stent, postdilated to >0.3mm  . Coronary angioplasty with stent placement  11-22-2009  dr Irish Lack  Acute inferoposterior MI/  ef 60%,  PCI with DES x1 to dRCA,  25%  mLAD  . Coronary angioplasty with stent placement  06-26-2010  dr Daneen Schick    Cutting balloon angioplasty to dRCA with DES x1,  40% mLAD (non-obstructive CAD)  . Transthoracic echocardiogram  11-17-2011    grade I diastolic dysfunction/  ef 55-60%  . Cardiovascular stress test  11-22-2013  dr Irish Lack    normal lexiscan study/  no ischemia/  normal LVF and wall motion/ ef 81%  . Esophagogastroduodenoscopy (egd) with esophageal dilation  05-20-2011  . Colonoscopy  last one 2014  . Needle guided excision breast calcifications Right 08-09-2008    Family History  Problem Relation Age of Onset  . Breast cancer Sister    . Cancer Sister     breast  . Breast cancer      niece  . Heart disease Father   . Hypertension Father   . Emphysema Father   . Coronary artery disease Father   . Colon cancer Neg Hx   . Heart attack Mother   . CVA Mother      History  Substance Use Topics  . Smoking status: Former Smoker -- 0.30 packs/day for 3 years    Types: Cigarettes    Start date: 11/08/1964    Quit date: 11/09/1967  . Smokeless tobacco: Never Used  . Alcohol Use: No    Prior to Admission medications   Medication Sig Start Date End Date Taking? Authorizing Provider  ALPRAZolam (XANAX) 0.25 MG tablet Take 0.25 mg by mouth at bedtime.    Yes Historical Provider, MD  aspirin EC 81 MG tablet Take 81 mg by mouth daily.   Yes Historical Provider, MD  calcitRIOL (ROCALTROL) 0.5 MCG capsule Take 0.5 mcg by mouth every morning.    Yes Historical Provider, MD  Calcium Carbonate-Vitamin D (CALCIUM + D PO) Take 1 tablet by mouth daily.   Yes Historical Provider, MD  ciprofloxacin (CIPRO) 500 MG tablet Take 500 mg by mouth 2 (two) times daily. Not sure of dosage amount   Yes Historical Provider, MD  clopidogrel (PLAVIX) 75 MG tablet Take 1 tablet (75 mg total) by mouth daily with breakfast. 09/19/13  Yes Sueanne Margarita, MD  diltiazem (MATZIM LA) 240 MG 24 hr tablet Take 240 mg by mouth every morning.    Yes Historical Provider, MD  dimenhyDRINATE (DRAMAMINE) 50 MG tablet Take 25 mg by mouth at bedtime as needed.   Yes Historical Provider, MD  doxazosin (CARDURA) 2 MG tablet Take 1 tablet (2 mg total) by mouth daily. Patient taking differently: Take 2 mg by mouth every morning.  01/28/14  Yes Sueanne Margarita, MD  HYDROcodone-acetaminophen (NORCO/VICODIN) 5-325 MG per tablet Take 1-2 tablets by mouth every 4 (four) hours as needed for moderate pain.   Yes Historical Provider, MD  isosorbide mononitrate (IMDUR) 60 MG 24 hr tablet Take 1.5 tablets (90 mg total) by mouth daily. Patient taking differently: Take 90 mg by mouth  every morning.  01/28/14  Yes Sueanne Margarita, MD  levETIRAcetam (KEPPRA) 500 MG tablet Take 1 tablet (500 mg total) by mouth 2 (two) times daily. 07/26/13  Yes Kathrynn Ducking, MD  levothyroxine (SYNTHROID, LEVOTHROID) 100 MCG tablet Take 100 mcg by mouth daily before breakfast.    Yes Historical Provider, MD  losartan (COZAAR) 100 MG tablet Take 100 mg by mouth every morning.    Yes Historical Provider, MD  metoprolol succinate (TOPROL-XL) 50  MG 24 hr tablet TAKE 1 TABLET (50 MG TOTAL) BY MOUTH DAILY. TAKE WITH OR IMMEDIATELY FOLLOWING A MEAL. Patient taking differently: TAKE 1 TABLET (50 MG TOTAL) BY MOUTH DAILY. TAKE WITH OR IMMEDIATELY FOLLOWING A MEAL.---  takes in am 12/03/13  Yes Sueanne Margarita, MD  nitroGLYCERIN (NITROSTAT) 0.4 MG SL tablet Place 0.4 mg under the tongue every 5 (five) minutes as needed for chest pain.   Yes Historical Provider, MD  ondansetron (ZOFRAN) 4 MG tablet Take 1 tablet (4 mg total) by mouth every 6 (six) hours. 02/10/14  Yes Dorie Rank, MD  oxyCODONE-acetaminophen (PERCOCET/ROXICET) 5-325 MG per tablet Take 1-2 tablets by mouth every 6 (six) hours as needed. 02/10/14  Yes Dorie Rank, MD  pantoprazole (PROTONIX) 40 MG tablet TAKE 1 TABLET (40 MG TOTAL) BY MOUTH 2 (TWO) TIMES DAILY. 02/11/14  Yes Irene Shipper, MD  PARoxetine (PAXIL) 30 MG tablet Take 30 mg by mouth daily. 11/17/13  Yes Historical Provider, MD  potassium chloride SA (K-DUR,KLOR-CON) 20 MEQ tablet Take 20 mEq by mouth 2 (two) times daily.   Yes Historical Provider, MD  rosuvastatin (CRESTOR) 20 MG tablet Take 20 mg by mouth every morning.    Yes Historical Provider, MD    Current Facility-Administered Medications  Medication Dose Route Frequency Provider Last Rate Last Dose  . 0.9 %  sodium chloride infusion   Intravenous Continuous Hosie Poisson, MD 100 mL/hr at 02/15/14 0415    . ALPRAZolam Duanne Moron) tablet 0.25 mg  0.25 mg Oral QHS Hosie Poisson, MD   0.25 mg at 02/14/14 2106  . ciprofloxacin (CIPRO) tablet  500 mg  500 mg Oral Q breakfast Hosie Poisson, MD   500 mg at 02/15/14 0843  . clopidogrel (PLAVIX) tablet 75 mg  75 mg Oral Q breakfast Hosie Poisson, MD   75 mg at 02/15/14 0844  . diltiazem (CARDIZEM LA) 24 hr tablet 240 mg  240 mg Oral q morning - 10a Hosie Poisson, MD      . feeding supplement (ENSURE COMPLETE) (ENSURE COMPLETE) liquid 237 mL  237 mL Oral BID BM Hosie Poisson, MD      . levETIRAcetam (KEPPRA) tablet 500 mg  500 mg Oral BID Hosie Poisson, MD   500 mg at 02/14/14 2105  . levothyroxine (SYNTHROID, LEVOTHROID) tablet 100 mcg  100 mcg Oral QAC breakfast Hosie Poisson, MD   100 mcg at 02/15/14 0843  . metoprolol succinate (TOPROL-XL) 24 hr tablet 50 mg  50 mg Oral Daily Hosie Poisson, MD      . nitroGLYCERIN (NITROSTAT) SL tablet 0.4 mg  0.4 mg Sublingual Q5 min PRN Hosie Poisson, MD      . oxyCODONE-acetaminophen (PERCOCET/ROXICET) 5-325 MG per tablet 1-2 tablet  1-2 tablet Oral Q6H PRN Hosie Poisson, MD      . pantoprazole (PROTONIX) EC tablet 40 mg  40 mg Oral BID AC Hosie Poisson, MD   40 mg at 02/15/14 0844  . PARoxetine (PAXIL) tablet 30 mg  30 mg Oral Daily Hosie Poisson, MD      . rosuvastatin (CRESTOR) tablet 20 mg  20 mg Oral q morning - 10a Hosie Poisson, MD        Allergies as of 02/14/2014  . (No Known Allergies)    Review of Systems:    All systems reviewed and negative except where noted in HPI.    Physical Exam:  Vital signs in last 24 hours: Temp:  [97.6 F (36.4 C)-98.2 F (36.8 C)] 98.2 F (  36.8 C) (02/05 0459) Pulse Rate:  [69-79] 76 (02/05 0459) Resp:  [12-18] 14 (02/05 0459) BP: (122-148)/(61-91) 147/70 mmHg (02/05 0459) SpO2:  [92 %-98 %] 98 % (02/05 0459) Weight:  [142 lb (64.411 kg)] 142 lb (64.411 kg) (02/04 1702) Last BM Date: 02/14/14 General:   Pleasant white female in NAD Head:  Normocephalic and atraumatic. Eyes:   No icterus.   Conjunctiva pink. Ears:  Normal auditory acuity. Mouth: no candida seen Neck:  Supple; no masses felt Lungs:   Respirations even and unlabored. A few inspiratory crackles in bilateral bases.   Heart:  Regular rate and rhythm Abdomen:  Soft, obese, nondistended, nild mid right tenderness and fullness without discrete mass.   Rectal:  Not performed.  Msk:  Symmetrical without gross deformities.  Extremities:  Without edema. Neurologic:  Alert and  oriented x4;  grossly normal neurologically. Skin:  Intact without significant lesions or rashes. Cervical Nodes:  No significant cervical adenopathy. Psych:  Alert and cooperative. Normal affect.  LAB RESULTS:  Recent Labs  02/14/14 1203 02/15/14 0200  WBC 13.6* 12.2*  HGB 10.3* 9.1*  HCT 31.5* 27.7*  PLT 231 223   BMET  Recent Labs  02/14/14 1203 02/15/14 0200  NA 140 140  K 4.8 4.5  CL 110 115*  CO2 21 21  GLUCOSE 136* 136*  BUN 27* 30*  CREATININE 2.56* 2.28*  CALCIUM 10.9* 9.7    STUDIES: Dg Chest 2 View  02/14/2014   CLINICAL DATA:  Chest pain and shortness of breath postoperatively  EXAM: CHEST  2 VIEW  COMPARISON:  02/06/2014 chest CT  FINDINGS: Stable postoperative appearance of the mediastinum, with right diaphragm elevation. There is chronic cardiomegaly without failure.  Chronic mild scarring in the perihilar lung.  IMPRESSION: Stable exam.  No acute cardiopulmonary disease.   Electronically Signed   By: Jorje Guild M.D.   On: 02/14/2014 13:49    PREVIOUS ENDOSCOPIES:             Mulitple EGD with dilation of stricture through the years, most recent was May 2013 - large caliber ring in distal esophagus which was dilated  Colonoscopy Dec 2011. Normal.   Impression / Plan:   32. 69 year old female with history of GERD / esophageal strictures and esophageal dysmotility. On chronic PPI. Patient admitted with chest pain. Negative troponin. She describes odynophagia which started acutely yesterday. Patient was intubated for urologic procedure 2 days ago. Wonder if acute odynophagia related to recent intubation.  Doubt candida  or pill esophagitis. Will arrange for diagnostic EGD (on Plavix) to be done today. She is NPO. Further recommendations following endoscopy.  2. Acute on chronic kidney disease, improving.  3. Leukocytosis, improving. Etiology?   4. Cystoscopy / ureteroscopy / lithotripsy and double J stent placement 2 days ago  5.  Multiple significant medical problems.  6. Chronic normocytic anemia.     Thanks   LOS: 1 day   Tye Savoy  02/15/2014, 9:14 AM

## 2014-02-16 LAB — BASIC METABOLIC PANEL
Anion gap: 5 (ref 5–15)
BUN: 22 mg/dL (ref 6–23)
CALCIUM: 9.2 mg/dL (ref 8.4–10.5)
CO2: 22 mmol/L (ref 19–32)
Chloride: 114 mmol/L — ABNORMAL HIGH (ref 96–112)
Creatinine, Ser: 1.72 mg/dL — ABNORMAL HIGH (ref 0.50–1.10)
GFR calc Af Amer: 34 mL/min — ABNORMAL LOW (ref >90)
GFR calc non Af Amer: 30 mL/min — ABNORMAL LOW (ref >90)
Glucose, Bld: 111 mg/dL — ABNORMAL HIGH (ref 70–99)
Potassium: 3.3 mmol/L — ABNORMAL LOW (ref 3.5–5.1)
Sodium: 141 mmol/L (ref 135–145)

## 2014-02-16 LAB — URINE CULTURE
Colony Count: NO GROWTH
Culture: NO GROWTH

## 2014-02-16 MED ORDER — OXYBUTYNIN CHLORIDE 5 MG PO TABS: 5.0000 mg | ORAL_TABLET | Freq: Three times a day (TID) | ORAL | Status: DC

## 2014-02-16 MED ORDER — SUCRALFATE 1 GM/10ML PO SUSP: 1.0000 g | Freq: Three times a day (TID) | ORAL | Status: DC

## 2014-02-16 NOTE — Progress Notes (Signed)
CSW consulted for potential snf. Pt recommended for [place] pt. No further Clinical Social Work needs, signing off.   Noreene Larsson [phone]  ED CSW 02/16/2014 2:53 PM

## 2014-02-16 NOTE — Evaluation (Signed)
Physical Therapy Evaluation Patient Details Name: [name] MRN: [id number] DOB: May 28, 1945 Today's Date: 02/16/2014   History of Present Illness  69 y/o female with h/o CAD s/p PCI to LAD, s/p resection Pericardial cyst resection 2015, ASCAD, HTn, HLd, GERD, stricture s/p dilatation, seizures, Cervical spondylosis, Chronic LBP, and admitted 02/14/14 with dysphagia.  Clinical Impression  Pt admitted with above diagnosis. Pt currently with functional limitations due to the deficits listed below (see PT Problem List).  Pt will benefit from skilled PT to increase their independence and safety with mobility to allow discharge to the venue listed below.  Pt able to tolerate short distance ambulation and reports she feels comfortable with d/c home, spouse able to assist if needed.  Pt agreeable to HHPT to improve strength and endurance.     Follow Up Recommendations Home health PT    Equipment Recommendations  None recommended by PT    Recommendations for Other Services       Precautions / Restrictions Precautions Precautions: Fall Restrictions Weight Bearing Restrictions: Yes      Mobility  Bed Mobility Overal bed mobility: Needs Assistance Bed Mobility: Supine to Sit     Supine to sit: Supervision        Transfers   Equipment used: None Transfers: Sit to/from Stand Sit to Stand: Min guard            Ambulation/Gait Ambulation/Gait assistance: Min guard Ambulation Distance (Feet): 80 Feet Assistive device: None Gait Pattern/deviations: Step-through pattern;Decreased stride length;Narrow base of support Gait velocity: decr   General Gait Details: slow pace, stiff upper body and R LE toe out observed, pt reports feeling very weak  Stairs            Wheelchair Mobility    Modified Rankin (Stroke Patients Only)       Balance Overall balance assessment:  (denies hx of falls)                                           Pertinent  Vitals/Pain Pain Assessment: No/denies pain    Home Living Family/patient expects to be discharged to:: Private residence Living Arrangements: Spouse/significant other   Type of Home: House Home Access: Stairs to enter Entrance Stairs-Rails: Right Entrance Stairs-Number of Steps: 5 Home Layout: One level Home Equipment: None      Prior Function Level of Independence: Independent               Hand Dominance        Extremity/Trunk Assessment               Lower Extremity Assessment: Generalized weakness         Communication   Communication: No difficulties  Cognition Arousal/Alertness: Awake/alert Behavior During Therapy: WFL for tasks assessed/performed Overall Cognitive Status: Within Functional Limits for tasks assessed                      General Comments      Exercises        Assessment/Plan    PT Assessment Patient needs continued PT services  PT Diagnosis Difficulty walking;Generalized weakness   PT Problem List Decreased strength;Decreased activity tolerance;Decreased mobility  PT Treatment Interventions Gait training;DME instruction;Functional mobility training;Patient/family education;Therapeutic activities;Therapeutic exercise   PT Goals (Current goals can be found in the Care Plan section) Acute Rehab PT Goals PT Goal Formulation:  With patient Time For Goal Achievement: 02/23/14 Potential to Achieve Goals: Good    Frequency Min 3X/week   Barriers to discharge        Co-evaluation               End of Session   Activity Tolerance: Patient tolerated treatment well Patient left: in chair;with call bell/phone within reach Nurse Communication: Mobility status         Time: 3016-0109 PT Time Calculation (min) (ACUTE ONLY): 10 min   Charges:   PT Evaluation $Initial PT Evaluation Tier I: 1 Procedure     PT G Codes:        [name],[name] 02/16/2014, 9:33 AM Carmelia Bake, PT, DPT 02/16/2014 Pager:  [phone]

## 2014-02-16 NOTE — Progress Notes (Addendum)
CARE MANAGEMENT NOTE 02/16/2014  Patient:  HENNIE, GOSA   Account Number:  [id number]  Date Initiated:  02/16/2014  Documentation initiated by:  [facility]  Subjective/Objective Assessment:   chest pain     Action/Plan:   Anticipated DC Date:  02/16/2014   Anticipated DC Plan:  [place]  CM consult      [facility] Choice  HOME HEALTH   Choice offered to / List presented to:  C-1 Patient          [place] arranged  [place] RN  [place].   Status of service:  Completed, signed off Medicare Important Message given?   (If response is "NO", the following Medicare IM given date fields will be blank) Date Medicare IM given:   Medicare IM given by:   Date Additional Medicare IM given:   Additional Medicare IM given by:    Discharge Disposition:  [place]  Per UR Regulation:    If discussed at Long Length of Stay Meetings, dates discussed:    Comments:  02/16/2014 1530 NCM spoke to pt and offered choice for [facility]. Gave permission to speak to husband. Husband agreeable to [facility] for [facility]. States no DME is needed. Jonnie Finner RN CCM Case Mgmt phone [phone]

## 2014-02-16 NOTE — Discharge Summary (Signed)
Physician Discharge Summary  [name] ZSW:[id number] DOB: 1945-12-27 DOA: 02/14/2014  PCP: Reginia Naas, MD  Admit date: 02/14/2014 Discharge date: 02/16/2014  Time spent: 25 minutes  Recommendations for Outpatient Follow-up:  1. Continue Carafate and twice a day PPI until seen by GI 2. Needs basic metabolic panel 1 week 3. Recommend further discussion with cardiology regarding need for Plavix-she is well over 1 year outside of the window where her stent was placed 4. Continue seizure medications and follow-up with Dr. Jannifer Franklin as an outpatient -also consider discontinuation as an outpatient of Xanax as this is near her toxic 5. Needs screening TSH in about 3-4 weeks 6. Please follow-up pathology from biopsies 7. Home health has been requested for the patient and she was slightly unsteady on her feet   Discharge Diagnoses:  Active Problems:   GERD   Chest pain   Renal insufficiency   Acute renal failure   Odynophagia   History of esophageal stricture   Esophageal dysmotility   Discharge Condition: Fair  Diet recommendation: [place] heart healthy diet  Filed Weights   02/14/14 1702  Weight: 64.411 kg (142 lb)    History of present illness:  69 y/o ? h/o CAD 03/14/12 s/p PCI to LAD c Promus-stent 3/24, s/p resection Pericardial cyst resection 2015, ASCAD, HTn, HLd, GERD + stricture s/p dilatation, h/o Sz foll by Neurology, Cervical spondylosis. Chronic LBP On 02/13/14, patient underwent a cystoscopy with flexible ureteroscopy and lithotripsy with right retrograde pyelogram because of nephrolithiasis-6 French 24 cm double-J stent was placed and holmium laser therapy was done. Patient was intubated and did reasonably fair after the procedure but subsequently was not able to eat had difficulty swallowing and represented to the hospital on 02/15/14 with dysphagia, nausea vomiting and inability to eat It was noted patient's kidney function has declined from BUN/creatinine  17/1.53---->27/2.56. ProBNP was 793 Troponins were negative See below for the hospital stay  Hospital Course:   1. Dysphagia-secondary to gastritis DDX pharyngeal patient underwent EGD 02/15/14 showing mild esophagitis in the distal esophagus with nonobstructive Schatzki ring and multiple polyps 3-7 mm in size -she will take twice a day PPI Continue sucralfate 1 g 3 times a day-will need outpatient follow-up with Dr. Henrene Pastor may be in 8 weeks 2. Acute kidney injury-Baseline creatinine =Ck-Glt Equation CKD stg 3. We will continue IV fluid repletion with saline 100 cc per hour. Rpt Bmet am 02/16/14 showed reasonable resolution to close to her baseline creatinine at 1.7.  3. Recent nephrolithiasis and cystoscopy-discussed with Dr. Risa Grill of urologyon 02/15/14 -may start Ditropan 5 3 times a day for spasm-like pains.She still had a little bit of hematuria but pain was much better subsequent to addition of this medication and I will let him know that she improved by sending him this note  4. Hematuria-expected secondary to procedure and #3. 5. CAD status post stent 04/03/12-continue Plavix 75 daily-discussion as an outpatient with cardiology regarding if Plavix is still needed long-term vs aSA 81 mg 6. Hypertension continue metoprolol 50 twice a day, Cardizem 240 every morning blood pressure not at goal 7. Seizure disorder-outpatient follow-up with Dr. Jannifer Franklin, continue Keppra 500 twice a day 8. Bipolar-continue Paxil 30 daily, Xanax 0.25 daily at bedtime-may benefit from not being on Ativan long-term 9. Hyperlipidemia continue Crestor 20 every morning 10. Hypothyroidism continue Synthroid 100 g every morning   Procedures: Endoscopy 02/15/14  ENDOSCOPIC IMPRESSION: 1. Mild esophagitis in the distal esophagus, non-obstructing Schatzki's ring at GE junction 2. Multiple sessile  polyps ranging between 3-30mm in size were found in the cardia, gastric fundus, and gastric body 3. The stomach otherwise  appeared normal 4. The duodenal mucosa showed no abnormalities in the bulb and 2nd part of the duodenum (i.e. Studies not automatically included, echos, thoracentesis, etc; not x-rays)  Consultations: Gastroenterology  Discharge Exam: Filed Vitals:   02/16/14 1500  BP: 144/86  Pulse: 60  Temp: 97.6 F (36.4 C)  Resp: 16    General: Alert pleasant oriented  Cardiovascular: S1-S2 no murmur rub or gallop  Respiratory: Clinically clear   Discharge Instructions   Discharge Instructions    Diet - low sodium heart healthy    Complete by:  As directed      Discharge instructions    Complete by:  As directed   Please follow-up with your urologist as an outpatient and continue the medications that he advised [place] to start for your spasm Please get lab work done within the next week Try to take bland foods for the next couple of days and follow-up with gastroenterologist as an outpatient You and your cardiologist need to discuss how long you will need to be on Plavix     Increase activity slowly    Complete by:  As directed           Current Discharge Medication List    START taking these medications   Details  oxybutynin (DITROPAN) 5 MG tablet Take 1 tablet (5 mg total) by mouth 3 (three) times daily. Qty: 60 tablet, Refills: 0    sucralfate (CARAFATE) 1 GM/10ML suspension Take 10 mLs (1 g total) by mouth 4 (four) times daily -  with meals and at bedtime. Qty: 420 mL, Refills: 0      CONTINUE these medications which have NOT CHANGED   Details  ALPRAZolam (XANAX) 0.25 MG tablet Take 0.25 mg by mouth at bedtime.     aspirin EC 81 MG tablet Take 81 mg by mouth daily.    calcitRIOL (ROCALTROL) 0.5 MCG capsule Take 0.5 mcg by mouth every morning.     Calcium Carbonate-Vitamin D (CALCIUM + D PO) Take 1 tablet by mouth daily.    ciprofloxacin (CIPRO) 500 MG tablet Take 500 mg by mouth 2 (two) times daily. Not sure of dosage amount    clopidogrel (PLAVIX) 75 MG tablet Take 1  tablet (75 mg total) by mouth daily with breakfast. Qty: 30 tablet, Refills: 4    diltiazem (MATZIM LA) 240 MG 24 hr tablet Take 240 mg by mouth every morning.     dimenhyDRINATE (DRAMAMINE) 50 MG tablet Take 25 mg by mouth at bedtime as needed.    doxazosin (CARDURA) 2 MG tablet Take 1 tablet (2 mg total) by mouth daily. Qty: 30 tablet, Refills: 2    HYDROcodone-acetaminophen (NORCO/VICODIN) 5-325 MG per tablet Take 1-2 tablets by mouth every 4 (four) hours as needed for moderate pain.    isosorbide mononitrate (IMDUR) 60 MG 24 hr tablet Take 1.5 tablets (90 mg total) by mouth daily. Qty: 45 tablet, Refills: 6    levETIRAcetam (KEPPRA) 500 MG tablet Take 1 tablet (500 mg total) by mouth 2 (two) times daily. Qty: 60 tablet, Refills: 11    levothyroxine (SYNTHROID, LEVOTHROID) 100 MCG tablet Take 100 mcg by mouth daily before breakfast.     losartan (COZAAR) 100 MG tablet Take 100 mg by mouth every morning.     metoprolol succinate (TOPROL-XL) 50 MG 24 hr tablet TAKE 1 TABLET (50 MG TOTAL) BY  MOUTH DAILY. TAKE WITH OR IMMEDIATELY FOLLOWING A MEAL. Qty: 30 tablet, Refills: 6    nitroGLYCERIN (NITROSTAT) 0.4 MG SL tablet Place 0.4 mg under the tongue every 5 (five) minutes as needed for chest pain.    ondansetron (ZOFRAN) 4 MG tablet Take 1 tablet (4 mg total) by mouth every 6 (six) hours. Qty: 12 tablet, Refills: 0    oxyCODONE-acetaminophen (PERCOCET/ROXICET) 5-325 MG per tablet Take 1-2 tablets by mouth every 6 (six) hours as needed. Qty: 30 tablet, Refills: 0    pantoprazole (PROTONIX) 40 MG tablet TAKE 1 TABLET (40 MG TOTAL) BY MOUTH 2 (TWO) TIMES DAILY. Qty: 60 tablet, Refills: 6    PARoxetine (PAXIL) 30 MG tablet Take 30 mg by mouth daily.    potassium chloride SA (K-DUR,KLOR-CON) 20 MEQ tablet Take 20 mEq by mouth 2 (two) times daily.    rosuvastatin (CRESTOR) 20 MG tablet Take 20 mg by mouth every morning.        No Known Allergies Follow-up Information    Follow  up with [facility].   Contact information:   [place] [id number] [phone]      Follow up with [facility].   Why:  Home Health Physical Therapy   Contact information:   [address] [place] [address] [phone]        The results of significant diagnostics from this hospitalization (including imaging, microbiology, ancillary and laboratory) are listed below for reference.    Significant Diagnostic Studies: Dg Chest 2 View  02/14/2014   CLINICAL DATA:  Chest pain and shortness of breath postoperatively  EXAM: CHEST  2 VIEW  COMPARISON:  02/06/2014 chest CT  FINDINGS: Stable postoperative appearance of the mediastinum, with right diaphragm elevation. There is chronic cardiomegaly without failure.  Chronic mild scarring in the perihilar lung.  IMPRESSION: Stable exam.  No acute cardiopulmonary disease.   Electronically Signed   By: Jorje Guild M.D.   On: 02/14/2014 13:49   Ct Chest W Contrast  02/06/2014   CLINICAL DATA:  Mediastinal mass.  EXAM: CT CHEST WITH CONTRAST  TECHNIQUE: Multidetector CT imaging of the chest was performed during intravenous contrast administration.  CONTRAST:  81mL OMNIPAQUE IOHEXOL 300 MG/ML  SOLN  COMPARISON:  CT scan of November 22, 2013.  FINDINGS: No pneumothorax or pleural effusion is noted. Stable scarring is noted in right upper lobe. No acute pulmonary disease is noted. No evidence of thoracic aortic dissection or aneurysm is noted. No mediastinal mass or adenopathy is noted. Surgical clips are noted in the anterior mediastinum which are unchanged compared to prior exam. Sternotomy wires are noted. Mild coronary artery calcifications are noted. No significant osseous abnormality is noted. No significant abnormality seen in the visualized portion of the upper abdomen.  IMPRESSION: No evidence of recurrent mass or malignancy is noted. Stable right upper lobe scarring is  noted compared to prior exam.   Electronically Signed   By: Sabino Dick M.D.   On: 02/06/2014 13:38    Microbiology: Recent Results (from the past 240 hour(s))  Culture, Urine     Status: None   Collection Time: 02/14/14  8:30 PM  Result Value Ref Range Status   Specimen Description URINE, RANDOM  Final   Special Requests NONE  Final   Colony Count NO GROWTH Performed at [facility]   Final   Culture NO GROWTH Performed at [facility]   Final  Report Status 02/16/2014 FINAL  Final     Labs: Basic Metabolic Panel:  Recent Labs Lab 02/10/14 1931 02/14/14 1203 02/15/14 0200 02/16/14 0839  NA 141 140 140 141  K 3.5 4.8 4.5 3.3*  CL 107 110 115* 114*  CO2 26 21 21 22   GLUCOSE 125* 136* 136* 111*  BUN 17 27* 30* 22  CREATININE 1.53* 2.56* 2.28* 1.72*  CALCIUM 9.7 10.9* 9.7 9.2   Liver Function Tests:  Recent Labs Lab 02/10/14 1931  AST 20  ALT 13  ALKPHOS 71  BILITOT 0.6  PROT 6.7  ALBUMIN 3.7    Recent Labs Lab 02/10/14 1931  LIPASE 26   No results for input(s): AMMONIA in the last 168 hours. CBC:  Recent Labs Lab 02/10/14 1931 02/14/14 1203 02/15/14 0200  WBC 11.9* 13.6* 12.2*  NEUTROABS 8.4*  --   --   HGB 11.4* 10.3* 9.1*  HCT 34.1* 31.5* 27.7*  MCV 91.9 93.5 93.3  PLT 235 231 223   Cardiac Enzymes:  Recent Labs Lab 02/14/14 2020 02/15/14 0200 02/15/14 0806  TROPONINI <0.03 <0.03 <0.03   BNP: BNP (last 3 results)  Recent Labs  02/14/14 1203  BNP 793.2*    ProBNP (last 3 results) No results for input(s): PROBNP in the last 8760 hours.  CBG: No results for input(s): GLUCAP in the last 168 hours.     SignedNita Sells  [facility] 02/16/2014, 5:18 PM

## 2014-02-18 ENCOUNTER — Encounter (HOSPITAL_COMMUNITY): Payer: Self-pay | Admitting: Internal Medicine

## 2014-02-19 ENCOUNTER — Telehealth: Payer: Self-pay | Admitting: Cardiology

## 2014-02-19 ENCOUNTER — Other Ambulatory Visit (INDEPENDENT_AMBULATORY_CARE_PROVIDER_SITE_OTHER): Payer: PPO | Admitting: *Deleted

## 2014-02-19 DIAGNOSIS — E785 Hyperlipidemia, unspecified: Secondary | ICD-10-CM

## 2014-02-19 DIAGNOSIS — Z79899 Other long term (current) drug therapy: Secondary | ICD-10-CM

## 2014-02-19 LAB — LIPID PANEL
Cholesterol: 134 mg/dL (ref 0–200)
HDL: 59.6 mg/dL (ref >39.00)
LDL CALC: 55 mg/dL (ref 0–99)
NonHDL: 74.4
TRIGLYCERIDES: 99 mg/dL (ref 0.0–149.0)
Total CHOL/HDL Ratio: 2
VLDL: 19.8 mg/dL (ref 0.0–40.0)

## 2014-02-19 LAB — HEPATIC FUNCTION PANEL
ALK PHOS: 55 U/L (ref 39–117)
ALT: 11 U/L (ref 0–35)
AST: 14 U/L (ref 0–37)
Albumin: 3.4 g/dL — ABNORMAL LOW (ref 3.5–5.2)
BILIRUBIN DIRECT: 0.2 mg/dL (ref 0.0–0.3)
BILIRUBIN TOTAL: 0.6 mg/dL (ref 0.2–1.2)
Total Protein: 6.3 g/dL (ref 6.0–8.3)

## 2014-02-19 NOTE — Telephone Encounter (Signed)
Walk in pt form "[employer]" paperwork dropped off [place] back on Wednesday 2.10 will give to her then

## 2014-02-20 ENCOUNTER — Telehealth: Payer: Self-pay | Admitting: Cardiology

## 2014-02-20 NOTE — Telephone Encounter (Signed)
Per Dr. Radford Pax, instructed patient to make an appointment with her GI MD ASAP. Patient thankful for callback and agrees with treatment plan.

## 2014-02-20 NOTE — Telephone Encounter (Signed)
Hospitalization reviewed from 02/15/2014 and she was diagnosed with esophagitis.  She has a history of severe GERD and stricture int he past.  She needs to make an appt with her GI MD

## 2014-02-20 NOTE — Telephone Encounter (Signed)
Call placed to the patient today to go over her lab results.  She recently had a cytoscopy with stent placement on 2/3 with Dr. Risa Grill.  She reports intermittent chest discomfort since that time that is mid-sternal and sharp when it occurs. She does feel SOB with discomfort, but this resolves with resolution of her discomfort.  Most recent episode was today while the patient was in the grocery store. This lasted about 10 minutes. The patient last saw Dr. Radford Pax in November and was documented to have chest pain at that time, but a normal nuclear test.  Her pain now is different than previous pain.  I advised the patient I would forward to Dr. Radford Pax to review and we will call back with recommendations.  She is agreeable.

## 2014-02-22 ENCOUNTER — Other Ambulatory Visit: Payer: Self-pay

## 2014-02-22 MED ORDER — CLOPIDOGREL BISULFATE 75 MG PO TABS: 75.0000 mg | ORAL_TABLET | Freq: Every day | ORAL | Status: DC

## 2014-02-25 ENCOUNTER — Encounter: Payer: Self-pay | Admitting: Cardiology

## 2014-05-06 ENCOUNTER — Other Ambulatory Visit: Payer: Self-pay | Admitting: *Deleted

## 2014-05-06 MED ORDER — DOXAZOSIN MESYLATE 2 MG PO TABS: 2.0000 mg | ORAL_TABLET | Freq: Every day | ORAL | Status: DC

## 2014-05-20 NOTE — Progress Notes (Signed)
Cardiology Office Note   Date:  05/21/2014   ID:  [name], DOB 31-Oct-1945, MRN [id number]  PCP:  Reginia Naas, MD    Chief Complaint  Patient presents with  . Coronary Artery Disease  . Follow-up    Hypertension  . Follow-up    Hyperlipidemia      History of Present Illness: [name] is a 69 y.o. female with a history of CAD/HTN and lipids who presents today for followup. She has a history of atypical CP and had a normal nuclear stress test. She underwent Gi eval which was negative for gallstones and she underwent esophageal dilatation but returned to the ER with recurrent CP and was found to have a 6cm mediastinal mass in front of the ascending aorta. She was seen by Dr. 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 She underwent median sternotomy with excision of anterior mediastinal anterior pericardial cyst on 02/05/2013. She is doing well. She occasionally has some mild Chest pain  that is midsternal and does not radiate.She says overall she is doing very well with her heart.  She has chronic DOE when going up stairs.   She denies any LE edema, palpitations.      Past Medical History  Diagnosis Date  . Irritable bowel syndrome   . Hypertension   . Depression   . Hypothyroidism   . Internal hemorrhoids   . Anxiety   . Heart murmur   . Hyperlipidemia   . Vitamin D deficiency   . Chronic low back pain   . Carpal tunnel syndrome   . History of non-ST elevation myocardial infarction (NSTEMI)     2011--  S/P PCI WITH SENTING  . S/P drug eluting coronary stent placement     X3  in 2011, 2012, 2014  . GERD (gastroesophageal reflux disease)   . History of hiatal hernia   . Partial seizure disorder NEUROLOGIST-- DR WILLIS    NOCTURNAL  . History of supraventricular tachycardia     2011-- resolved  . History of esophageal dilatation     FOR STRIUCTURE  . Gross hematuria   . Renal calculus, right   . Arthritis   .  Cervical spondylosis without myelopathy   . S/P pericardial cyst excision     02-05-2013  benign  . Coronary artery disease CARDIOLOGIST-  DR Irish Lack    hx NSTEMI inferoposterior 2011 s/p PCI  total x3 DES's  . PONV (postoperative nausea and vomiting)   . History of gout     Past Surgical History  Procedure Laterality Date  . Mediasternotomy N/A 02/05/2013    Procedure: MEDIAN STERNOTOMY;  Surgeon: Ivin Poot, MD;  Location: [facility] OR;  Service: Thoracic;  Laterality: N/A;  . Biopsy of mediastinal mass N/A 02/05/2013    Procedure: RESECTION OF MEDIASTINAL MASS;  Surgeon: Ivin Poot, MD;  Location: [facility] OR;  Service: Thoracic;  Laterality: N/A;  . Ectopic pregnancy surgery  YRS AGO    SALPINGECTOMY  . Coronary angiogram  03/10/2011    Procedure: CORONARY ANGIOGRAM;  Surgeon: Jettie Booze, MD;  Location: [facility] CATH LAB;  Service: Cardiovascular;;  . Left heart catheterization with coronary angiogram N/A 03/14/2012    Procedure: LEFT HEART CATHETERIZATION WITH CORONARY ANGIOGRAM;  Surgeon: Sueanne Margarita, MD;  Location: [facility] CATH LAB;  Service: Cardiovascular;  Laterality: N/A;  Normal LM,  50% pLAD,  70% mLAD,  D2 50-70%, very tortuous LAD,  70-82% ostial  LCFX,  50% in-stent restenosis of  dRCA and mRCA  stent and 50-70% ostial PDA,  normal LVSF, ef 60%  . Percutaneous coronary stent intervention (pci-s) N/A 04/03/2012    Procedure: PERCUTANEOUS CORONARY STENT INTERVENTION (PCI-S);  Surgeon: Jettie Booze, MD;  Location: [facility] CATH LAB;  Service: Cardiovascular;  Laterality: N/A;   Successful PCI  mLAD with 2.75x12 Promus stent, postdilated to >0.84mm  . Coronary angioplasty with stent placement  11-22-2009  dr Irish Lack    Acute inferoposterior MI/  ef 60%,  PCI with DES x1 to dRCA,  25%  mLAD  . Coronary angioplasty with stent placement  06-26-2010  dr Daneen Schick    Cutting balloon angioplasty to dRCA with DES x1,  40% mLAD (non-obstructive CAD)  . Transthoracic echocardiogram   11-17-2011    grade I diastolic dysfunction/  ef 55-60%  . Cardiovascular stress test  11-22-2013  dr Irish Lack    normal lexiscan study/  no ischemia/  normal LVF and wall motion/ ef 81%  . Esophagogastroduodenoscopy (egd) with esophageal dilation  05-20-2011  . Colonoscopy  last one 2014  . Needle guided excision breast calcifications Right 08-09-2008  . Cystoscopy with retrograde pyelogram, ureteroscopy and stent placement Right 02/13/2014    Procedure: CYSTOSCOPY WITH RETROGRADE PYELOGRAM, URETEROSCOPY AND STENT PLACEMENT;  Surgeon: Bernestine Amass, MD;  Location[facility];  Service: Urology;  Laterality: Right;  . Holmium laser application Right 03/11/5398    Procedure: HOLMIUM LASER APPLICATION;  Surgeon: Bernestine Amass, MD;  Location: [facility];  Service: Urology;  Laterality: Right;  . Esophagogastroduodenoscopy N/A 02/15/2014    Procedure: ESOPHAGOGASTRODUODENOSCOPY (EGD);  Surgeon: Jerene Bears, MD;  Location: [facility];  Service: Endoscopy;  Laterality: N/A;     Current Outpatient Prescriptions  Medication Sig Dispense Refill  . ALPRAZolam (XANAX) 0.25 MG tablet Take 0.25 mg by mouth at bedtime.     Marland Kitchen aspirin EC 81 MG tablet Take 81 mg by mouth daily.    . calcitRIOL (ROCALTROL) 0.5 MCG capsule Take 0.5 mcg by mouth every morning.     . Calcium Carbonate-Vitamin D (CALCIUM + D PO) Take 1 tablet by mouth daily.    . clopidogrel (PLAVIX) 75 MG tablet Take 1 tablet (75 mg total) by mouth daily with breakfast. 30 tablet 4  . diltiazem (MATZIM LA) 240 MG 24 hr tablet Take 240 mg by mouth every morning.     . dimenhyDRINATE (DRAMAMINE) 50 MG tablet Take 25 mg by mouth at bedtime as needed.    . doxazosin (CARDURA) 2 MG tablet Take 1 tablet (2 mg total) by mouth daily. 30 tablet 0  . HYDROcodone-acetaminophen (NORCO/VICODIN) 5-325 MG per tablet Take 1-2 tablets by mouth every 4 (four) hours as needed for moderate pain.    . isosorbide mononitrate (IMDUR)  60 MG 24 hr tablet Take 1.5 tablets (90 mg total) by mouth daily. (Patient taking differently: Take 90 mg by mouth every morning. ) 45 tablet 6  . levETIRAcetam (KEPPRA) 500 MG tablet Take 1 tablet (500 mg total) by mouth 2 (two) times daily. 60 tablet 11  . levothyroxine (SYNTHROID, LEVOTHROID) 100 MCG tablet Take 100 mcg by mouth daily before breakfast.     . losartan (COZAAR) 100 MG tablet Take 100 mg by mouth every morning.     . metoprolol succinate (TOPROL-XL) 50 MG 24 hr tablet TAKE 1 TABLET (50 MG TOTAL) BY MOUTH DAILY. TAKE WITH OR IMMEDIATELY FOLLOWING A MEAL. (Patient taking  differently: TAKE 1 TABLET (50 MG TOTAL) BY MOUTH DAILY. TAKE WITH OR IMMEDIATELY FOLLOWING A MEAL.---  takes in am) 30 tablet 6  . nitroGLYCERIN (NITROSTAT) 0.4 MG SL tablet Place 0.4 mg under the tongue every 5 (five) minutes as needed for chest pain.    Marland Kitchen ondansetron (ZOFRAN) 4 MG tablet Take 1 tablet (4 mg total) by mouth every 6 (six) hours. 12 tablet 0  . oxybutynin (DITROPAN) 5 MG tablet Take 1 tablet (5 mg total) by mouth 3 (three) times daily. 60 tablet 0  . pantoprazole (PROTONIX) 40 MG tablet TAKE 1 TABLET (40 MG TOTAL) BY MOUTH 2 (TWO) TIMES DAILY. 60 tablet 6  . PARoxetine (PAXIL) 30 MG tablet Take 30 mg by mouth daily.    . potassium chloride SA (K-DUR,KLOR-CON) 20 MEQ tablet Take 20 mEq by mouth 2 (two) times daily.    . rosuvastatin (CRESTOR) 20 MG tablet Take 20 mg by mouth every morning.     . sucralfate (CARAFATE) 1 GM/10ML suspension Take 10 mLs (1 g total) by mouth 4 (four) times daily -  with meals and at bedtime. 420 mL 0   No current facility-administered medications for this visit.    Allergies:   Review of patient's allergies indicates no known allergies.    Social History:  The patient  reports that she quit smoking about 46 years ago. Her smoking use included Cigarettes. She started smoking about 49 years ago. She has a .9 pack-year smoking history. She has never used smokeless tobacco.  She reports that she does not drink alcohol or use illicit drugs.   Family History:  The patient's family history includes Breast cancer in her sister and another family member; CVA in her mother; Cancer in her sister; Coronary artery disease in her father; Emphysema in her father; Heart attack in her mother; Heart disease in her father; Hypertension in her father. There is no history of Colon cancer.    ROS:  Please see the history of present illness.   Otherwise, review of systems are positive for none.   All other systems are reviewed and negative.    PHYSICAL EXAM: VS:  BP 110/64 mmHg  Pulse 59  Ht 4\' 11"  (1.499 m)  Wt 143 lb 6.4 oz (65.046 kg)  BMI 28.95 kg/m2  SpO2 97% , BMI Body mass index is 28.95 kg/(m^2). GEN: Well nourished, well developed, in no acute distress HEENT: normal Neck: no JVD, carotid bruits, or masses Cardiac: RRR; no murmurs, rubs, or gallops,no edema  Respiratory:  clear to auscultation bilaterally, normal work of breathing GI: soft, nontender, nondistended, + BS MS: no deformity or atrophy Skin: warm and dry, no rash Neuro:  Strength and sensation are intact Psych: euthymic mood, full affect   EKG:  EKG is not ordered today.    Recent Labs: 02/14/2014: B Natriuretic Peptide 793.2* 02/15/2014: Hemoglobin 9.1*; Platelets 223 02/16/2014: BUN 22; Creatinine 1.72*; Potassium 3.3*; Sodium 141 02/19/2014: ALT 11    Lipid Panel    Component Value Date/Time   CHOL 134 02/19/2014 0955   TRIG 99.0 02/19/2014 0955   HDL 59.60 02/19/2014 0955   CHOLHDL 2 02/19/2014 0955   VLDL 19.8 02/19/2014 0955   LDLCALC 55 02/19/2014 0955      Wt Readings from Last 3 Encounters:  05/21/14 143 lb 6.4 oz (65.046 kg)  02/14/14 142 lb (64.411 kg)  02/13/14 150 lb (68.04 kg)    ASSESSMENT AND PLAN:  1. ASCAD - with no typical angina.  Nuclear stress test 11/2013 with no ischemia - continue ASA/Plavix/Imdur/metoprolol  2. HTN - well controlled - continue  metoprolol/Losartan/doxazosin/diltiazem  - check BMET 3. Dyslipidemia - LDL at goal  - continue crestor  4. Chronic noncardiac CP secondary to pericardial cyst now improved after resection.  5. GERD with esophageal stricture - per GI 6.  SOB with recent nuclear stress test 11/2013 that showed no ischemia.  I suspect this is due to deconditioning.  She has stopped going to the gym.  I will get a 2D echo to assess LVF.  If ok I have encouraged her to try to get back into exercise.    Followup with me in 6 months    Current medicines are reviewed at length with the patient today.  The patient does not have concerns regarding medicines.  The following changes have been made:  no change  Labs/ tests ordered today include: see above assessment and plan No orders of the defined types were placed in this encounter.     Disposition:   FU with me in 6 months   Signed, Sueanne Margarita, MD  05/21/2014 9:25 AM    [facility] [address], [place], [place]  [address] Phone: [phone]; Fax: [phone]

## 2014-05-21 ENCOUNTER — Ambulatory Visit (INDEPENDENT_AMBULATORY_CARE_PROVIDER_SITE_OTHER): Payer: PPO | Admitting: Cardiology

## 2014-05-21 ENCOUNTER — Encounter: Payer: Self-pay | Admitting: Cardiology

## 2014-05-21 VITALS — BP 110/64 | HR 59 | Ht 59.0 in | Wt 143.4 lb

## 2014-05-21 DIAGNOSIS — R079 Chest pain, unspecified: Secondary | ICD-10-CM | POA: Diagnosis not present

## 2014-05-21 DIAGNOSIS — Q248 Other specified congenital malformations of heart: Secondary | ICD-10-CM

## 2014-05-21 DIAGNOSIS — I2510 Atherosclerotic heart disease of native coronary artery without angina pectoris: Secondary | ICD-10-CM | POA: Diagnosis not present

## 2014-05-21 DIAGNOSIS — I10 Essential (primary) hypertension: Secondary | ICD-10-CM

## 2014-05-21 DIAGNOSIS — E785 Hyperlipidemia, unspecified: Secondary | ICD-10-CM | POA: Diagnosis not present

## 2014-05-21 DIAGNOSIS — R0602 Shortness of breath: Secondary | ICD-10-CM

## 2014-05-21 DIAGNOSIS — Q249 Congenital malformation of heart, unspecified: Secondary | ICD-10-CM

## 2014-05-21 LAB — BASIC METABOLIC PANEL
BUN: 20 mg/dL (ref 6–23)
CALCIUM: 10.4 mg/dL (ref 8.4–10.5)
CO2: 29 mEq/L (ref 19–32)
CREATININE: 1.42 mg/dL — AB (ref 0.40–1.20)
Chloride: 105 mEq/L (ref 96–112)
GFR: 39.1 mL/min — ABNORMAL LOW (ref >60.00)
GLUCOSE: 102 mg/dL — AB (ref 70–99)
Potassium: 4 mEq/L (ref 3.5–5.1)
Sodium: 140 mEq/L (ref 135–145)

## 2014-05-21 NOTE — Addendum Note (Signed)
Addended by: Andres Ege on: 05/21/2014 10:06 AM   Modules accepted: Orders

## 2014-05-21 NOTE — Patient Instructions (Signed)
Medication Instructions:  Your physician recommends that you continue on your current medications as directed. Please refer to the Current Medication list given to you today.   Labwork: TODAY: BMET  Testing/Procedures: Your physician has requested that you have an echocardiogram. Echocardiography is a painless test that uses sound waves to create images of your heart. It provides your doctor with information about the size and shape of your heart and how well your heart's chambers and valves are working. This procedure takes approximately one hour. There are no restrictions for this procedure.  Follow-Up: Your physician wants you to follow-up in: 6 months with Dr. Radford Pax. You will receive a reminder letter in the mail two months in advance. If you don't receive a letter, please call our office to schedule the follow-up appointment.

## 2014-05-22 ENCOUNTER — Telehealth: Payer: Self-pay | Admitting: Cardiology

## 2014-05-22 NOTE — Telephone Encounter (Signed)
Patient notified of specific lab results and notation from Dr. Radford Pax "stable labs, continue on current meds". Patient verbalized understanding and agreement to continue with current treatment plan.

## 2014-05-22 NOTE — Telephone Encounter (Signed)
Follow Up       Pt returning Katy's call for lab results.

## 2014-05-23 ENCOUNTER — Other Ambulatory Visit: Payer: Self-pay

## 2014-05-23 ENCOUNTER — Ambulatory Visit (HOSPITAL_COMMUNITY): Payer: PPO | Attending: Cardiology

## 2014-05-23 DIAGNOSIS — R079 Chest pain, unspecified: Secondary | ICD-10-CM

## 2014-05-23 DIAGNOSIS — I340 Nonrheumatic mitral (valve) insufficiency: Secondary | ICD-10-CM | POA: Insufficient documentation

## 2014-05-23 DIAGNOSIS — I2510 Atherosclerotic heart disease of native coronary artery without angina pectoris: Secondary | ICD-10-CM

## 2014-05-23 DIAGNOSIS — Q249 Congenital malformation of heart, unspecified: Secondary | ICD-10-CM

## 2014-05-23 DIAGNOSIS — E785 Hyperlipidemia, unspecified: Secondary | ICD-10-CM | POA: Diagnosis not present

## 2014-05-23 DIAGNOSIS — R0602 Shortness of breath: Secondary | ICD-10-CM

## 2014-05-23 DIAGNOSIS — I10 Essential (primary) hypertension: Secondary | ICD-10-CM

## 2014-05-23 DIAGNOSIS — Q248 Other specified congenital malformations of heart: Secondary | ICD-10-CM

## 2014-05-24 ENCOUNTER — Telehealth: Payer: Self-pay

## 2014-05-24 DIAGNOSIS — I059 Rheumatic mitral valve disease, unspecified: Secondary | ICD-10-CM

## 2014-05-24 NOTE — Telephone Encounter (Signed)
Informed patient of results and verbal understanding expressed.  Repeat ECHO ordered to be scheduled in 1 year. Patient agrees with treatment plan. 

## 2014-05-24 NOTE — Telephone Encounter (Signed)
-----   Message from Sueanne Margarita, MD sent at 05/23/2014  8:48 PM EDT ----- Please let patient know that echo showed normal LVF with mild to moderately leaky MV - repeat echo in 1 year

## 2014-06-26 ENCOUNTER — Telehealth: Payer: Self-pay | Admitting: Cardiology

## 2014-06-26 NOTE — Telephone Encounter (Signed)
Per [facility] Dermat[facility] RN, it is not necessary to hold Plavix. She st she will call if holding medications is needed.  Informed [name] their office will give instructions if medications need to be held.

## 2014-06-26 NOTE — Telephone Encounter (Signed)
New Prob    Pt had a mole removed at her dermatologist office 6/9. However, office scheduled appointment for 6/23 for a more invasive procedure. Husband is calling to see if pt needs to hold Plavix again. Please call.

## 2014-06-27 ENCOUNTER — Other Ambulatory Visit: Payer: Self-pay | Admitting: *Deleted

## 2014-06-27 MED ORDER — NITROGLYCERIN 0.4 MG SL SUBL: 0.4000 mg | SUBLINGUAL_TABLET | SUBLINGUAL | Status: DC | PRN

## 2014-07-02 ENCOUNTER — Telehealth: Payer: Self-pay | Admitting: Cardiology

## 2014-07-02 NOTE — Telephone Encounter (Signed)
New message    Request for surgical clearance:  1. What type of surgery is being performed? Cervical & lumbar steroid injection  2. When is this surgery scheduled? 7.13.2016 @ 9:50 am    3. Are there any medications that need to be held prior to surgery and how long? Stop Plavix 5 day prior to procedure/    4. Name of physician performing surgery? Dr. Suella Broad   5. What is your office phone and fax number? Fax [phone]

## 2014-07-02 NOTE — Telephone Encounter (Signed)
Ok to hold plavix for procedure 

## 2014-07-02 NOTE — Telephone Encounter (Signed)
Printed and placed in Medical Records "to be faxed" bin.

## 2014-07-08 ENCOUNTER — Other Ambulatory Visit: Payer: Self-pay | Admitting: *Deleted

## 2014-07-08 MED ORDER — DOXAZOSIN MESYLATE 2 MG PO TABS: 2.0000 mg | ORAL_TABLET | Freq: Every day | ORAL | Status: DC

## 2014-07-18 ENCOUNTER — Other Ambulatory Visit: Payer: Self-pay | Admitting: *Deleted

## 2014-07-18 MED ORDER — CLOPIDOGREL BISULFATE 75 MG PO TABS: 75.0000 mg | ORAL_TABLET | Freq: Every day | ORAL | Status: DC

## 2014-07-29 ENCOUNTER — Ambulatory Visit (INDEPENDENT_AMBULATORY_CARE_PROVIDER_SITE_OTHER): Payer: PPO | Admitting: Adult Health

## 2014-07-29 ENCOUNTER — Encounter: Payer: Self-pay | Admitting: Adult Health

## 2014-07-29 VITALS — BP 154/92 | HR 61 | Ht 59.0 in | Wt 142.0 lb

## 2014-07-29 DIAGNOSIS — R569 Unspecified convulsions: Secondary | ICD-10-CM | POA: Diagnosis not present

## 2014-07-29 DIAGNOSIS — R51 Headache: Secondary | ICD-10-CM

## 2014-07-29 DIAGNOSIS — R519 Headache, unspecified: Secondary | ICD-10-CM

## 2014-07-29 MED ORDER — LEVETIRACETAM 500 MG PO TABS: 500.0000 mg | ORAL_TABLET | Freq: Two times a day (BID) | ORAL | Status: DC

## 2014-07-29 NOTE — Patient Instructions (Signed)
Continue Keppra 500 mg twice a day. If you have any seizure events please let us know. Follow-up in one year or sooner if needed.

## 2014-07-29 NOTE — Progress Notes (Addendum)
PATIENT: [name] DOB: 04-30-1945  REASON FOR VISIT: follow up- seizures, headache HISTORY FROM: patient  HISTORY OF PRESENT ILLNESS: [name] is a 69 year old female with a history of seizures. She returns today for follow-up. She is currently taking Keppra 500 mg twice a day. She reports that she is tolerating this medication well. She denies any seizure events. She is able to complete all ADLs independently. She operates a Teacher, music without difficulty. Denies any changes with her gait or balance. She reports that her headaches have been controlled. He states that her headaches are very rare now. She does state that today she accidentally put on 2 different shoes. Denies any trouble with her memory. Denies any new medical issues. She continues to see Dr. Nelva Bush for her neck and back. She states that she just recently got epidural steroid injections. She returns today for an evaluation.  HISTORY 07/26/13: [name] is a 69 year old left-handed white female with a history of seizures. The patient has done quite well with her seizures, and these events tend to be nocturnal in nature. She indicates that it has been several years since her last seizure. She does operate a Teacher, music. She has been followed by Dr. Nelva Bush for neck and low back pain, and she has received some injections. She is also had an injection in the right knee. She is on Keppra, and she is tolerating the medication well. She indicates that her headaches are doing fairly well, she is having about one headache a month. She returns for an evaluation. She recently had a mediastinal cyst resection. The patient has chronic right arm weakness.  REVIEW OF SYSTEMS: Out of a complete 14 system review of symptoms, the patient complains only of the following symptoms, and all other reviewed systems are negative.  Back pain, neck pain, moles ALLERGIES: No Known Allergies  HOME MEDICATIONS: Outpatient Prescriptions Prior to  Visit  Medication Sig Dispense Refill  . ALPRAZolam (XANAX) 0.25 MG tablet Take 0.25 mg by mouth at bedtime.     Marland Kitchen aspirin EC 81 MG tablet Take 81 mg by mouth daily.    . calcitRIOL (ROCALTROL) 0.5 MCG capsule Take 0.5 mcg by mouth every morning.     . Calcium Carbonate-Vitamin D (CALCIUM + D PO) Take 1 tablet by mouth daily.    . clopidogrel (PLAVIX) 75 MG tablet Take 1 tablet (75 mg total) by mouth daily with breakfast. 30 tablet 10  . diltiazem (MATZIM LA) 240 MG 24 hr tablet Take 240 mg by mouth every morning.     . dimenhyDRINATE (DRAMAMINE) 50 MG tablet Take 25 mg by mouth at bedtime as needed.    . doxazosin (CARDURA) 2 MG tablet Take 1 tablet (2 mg total) by mouth daily. 30 tablet 5  . HYDROcodone-acetaminophen (NORCO/VICODIN) 5-325 MG per tablet Take 1-2 tablets by mouth every 4 (four) hours as needed for moderate pain.    . isosorbide mononitrate (IMDUR) 60 MG 24 hr tablet Take 1.5 tablets (90 mg total) by mouth daily. (Patient taking differently: Take 90 mg by mouth every morning. ) 45 tablet 6  . losartan (COZAAR) 100 MG tablet Take 100 mg by mouth every morning.     . metoprolol succinate (TOPROL-XL) 50 MG 24 hr tablet TAKE 1 TABLET (50 MG TOTAL) BY MOUTH DAILY. TAKE WITH OR IMMEDIATELY FOLLOWING A MEAL. (Patient taking differently: TAKE 1 TABLET (50 MG TOTAL) BY MOUTH DAILY. TAKE WITH OR IMMEDIATELY FOLLOWING A MEAL.---  takes in  am) 30 tablet 6  . nitroGLYCERIN (NITROSTAT) 0.4 MG SL tablet Place 1 tablet (0.4 mg total) under the tongue every 5 (five) minutes as needed for chest pain. 25 tablet 5  . pantoprazole (PROTONIX) 40 MG tablet TAKE 1 TABLET (40 MG TOTAL) BY MOUTH 2 (TWO) TIMES DAILY. 60 tablet 6  . PARoxetine (PAXIL) 30 MG tablet Take 30 mg by mouth daily.    . potassium chloride SA (K-DUR,KLOR-CON) 20 MEQ tablet Take 20 mEq by mouth 2 (two) times daily.    . rosuvastatin (CRESTOR) 20 MG tablet Take 20 mg by mouth every morning.     . levETIRAcetam (KEPPRA) 500 MG tablet  Take 1 tablet (500 mg total) by mouth 2 (two) times daily. 60 tablet 11  . ondansetron (ZOFRAN) 4 MG tablet Take 1 tablet (4 mg total) by mouth every 6 (six) hours. (Patient not taking: Reported on 07/29/2014) 12 tablet 0  . oxybutynin (DITROPAN) 5 MG tablet Take 1 tablet (5 mg total) by mouth 3 (three) times daily. (Patient not taking: Reported on 07/29/2014) 60 tablet 0  . levothyroxine (SYNTHROID, LEVOTHROID) 100 MCG tablet Take 100 mcg by mouth daily before breakfast.     . sucralfate (CARAFATE) 1 GM/10ML suspension Take 10 mLs (1 g total) by mouth 4 (four) times daily -  with meals and at bedtime. (Patient not taking: Reported on 07/29/2014) 420 mL 0   No facility-administered medications prior to visit.    PAST MEDICAL HISTORY: Past Medical History  Diagnosis Date  . Irritable bowel syndrome   . Hypertension   . Depression   . Hypothyroidism   . Internal hemorrhoids   . Anxiety   . Heart murmur   . Hyperlipidemia   . Vitamin D deficiency   . Chronic low back pain   . Carpal tunnel syndrome   . History of non-ST elevation myocardial infarction (NSTEMI)     2011--  S/P PCI WITH SENTING  . S/P drug eluting coronary stent placement     X3  in 2011, 2012, 2014  . GERD (gastroesophageal reflux disease)   . History of hiatal hernia   . Partial seizure disorder NEUROLOGIST-- DR WILLIS    NOCTURNAL  . History of supraventricular tachycardia     2011-- resolved  . History of esophageal dilatation     FOR STRIUCTURE  . Gross hematuria   . Renal calculus, right   . Arthritis   . Cervical spondylosis without myelopathy   . S/P pericardial cyst excision     02-05-2013  benign  . Coronary artery disease CARDIOLOGIST-  DR Irish Lack    hx NSTEMI inferoposterior 2011 s/p PCI  total x3 DES's  . PONV (postoperative nausea and vomiting)   . History of gout     PAST SURGICAL HISTORY: Past Surgical History  Procedure Laterality Date  . Mediasternotomy N/A 02/05/2013    Procedure: MEDIAN  STERNOTOMY;  Surgeon: Ivin Poot, MD;  Location: [facility] OR;  Service: Thoracic;  Laterality: N/A;  . Biopsy of mediastinal mass N/A 02/05/2013    Procedure: RESECTION OF MEDIASTINAL MASS;  Surgeon: Ivin Poot, MD;  Location: [facility] OR;  Service: Thoracic;  Laterality: N/A;  . Ectopic pregnancy surgery  YRS AGO    SALPINGECTOMY  . Coronary angiogram  03/10/2011    Procedure: CORONARY ANGIOGRAM;  Surgeon: Jettie Booze, MD;  Location: [facility] CATH LAB;  Service: Cardiovascular;;  . Left heart catheterization with coronary angiogram N/A 03/14/2012    Procedure: LEFT HEART  CATHETERIZATION WITH CORONARY ANGIOGRAM;  Surgeon: Sueanne Margarita, MD;  Location: [facility] CATH LAB;  Service: Cardiovascular;  Laterality: N/A;  Normal LM,  50% pLAD,  70% mLAD,  D2 50-70%, very tortuous LAD,  70-82% ostial LCFX,  50% in-stent restenosis of  dRCA and mRCA  stent and 50-70% ostial PDA,  normal LVSF, ef 60%  . Percutaneous coronary stent intervention (pci-s) N/A 04/03/2012    Procedure: PERCUTANEOUS CORONARY STENT INTERVENTION (PCI-S);  Surgeon: Jettie Booze, MD;  Location: [facility] CATH LAB;  Service: Cardiovascular;  Laterality: N/A;   Successful PCI  mLAD with 2.75x12 Promus stent, postdilated to >0.14mm  . Coronary angioplasty with stent placement  11-22-2009  dr Irish Lack    Acute inferoposterior MI/  ef 60%,  PCI with DES x1 to dRCA,  25%  mLAD  . Coronary angioplasty with stent placement  06-26-2010  dr Daneen Schick    Cutting balloon angioplasty to dRCA with DES x1,  40% mLAD (non-obstructive CAD)  . Transthoracic echocardiogram  11-17-2011    grade I diastolic dysfunction/  ef 55-60%  . Cardiovascular stress test  11-22-2013  dr Irish Lack    normal lexiscan study/  no ischemia/  normal LVF and wall motion/ ef 81%  . Esophagogastroduodenoscopy (egd) with esophageal dilation  05-20-2011  . Colonoscopy  last one 2014  . Needle guided excision breast calcifications Right 08-09-2008  . Cystoscopy with retrograde  pyelogram, ureteroscopy and stent placement Right 02/13/2014    Procedure: CYSTOSCOPY WITH RETROGRADE PYELOGRAM, URETEROSCOPY AND STENT PLACEMENT;  Surgeon: Bernestine Amass, MD;  Location: [facility];  Service: Urology;  Laterality: Right;  . Holmium laser application Right 0/08/6759    Procedure: HOLMIUM LASER APPLICATION;  Surgeon: Bernestine Amass, MD;  Location: [facility];  Service: Urology;  Laterality: Right;  . Esophagogastroduodenoscopy N/A 02/15/2014    Procedure: ESOPHAGOGASTRODUODENOSCOPY (EGD);  Surgeon: Jerene Bears, MD;  Location: [facility];  Service: Endoscopy;  Laterality: N/A;    FAMILY HISTORY: Family History  Problem Relation Age of Onset  . Breast cancer Sister   . Cancer Sister     breast  . Breast cancer      niece  . Heart disease Father   . Hypertension Father   . Emphysema Father   . Coronary artery disease Father   . Colon cancer Neg Hx   . Heart attack Mother   . CVA Mother     SOCIAL HISTORY: History   Social History  . Marital Status: Married    Spouse Name: N/A  . Number of Children: 0  . Years of Education: hs   Occupational History  . Retired    Social History Main Topics  . Smoking status: Former Smoker -- 0.30 packs/day for 3 years    Types: Cigarettes    Start date: 11/08/1964    Quit date: 11/09/1967  . Smokeless tobacco: Never Used  . Alcohol Use: No  . Drug Use: No  . Sexual Activity: Not on file   Other Topics Concern  . Not on file   Social History Narrative   Daily caffeine use: coffee.      PHYSICAL EXAM  Filed Vitals:   07/29/14 0910  BP: 154/92  Pulse: 61  Height: 4\' 11"  (1.499 m)  Weight: 142 lb (64.411 kg)   Body mass index is 28.67 kg/(m^2).  Generalized: Well developed, in no acute distress   Neurological examination  Mentation: Alert oriented to time, place, history taking. Follows  all commands speech and language fluent Cranial nerve II-XII: Pupils were equal round  reactive to light. Extraocular movements were full, visual field were full on confrontational test. Facial sensation and strength were normal. Uvula tongue midline. Head turning and shoulder shrug  were normal and symmetric. Motor: The motor testing reveals 5 over 5 strength of all 4 extremities. Grip decreased on the right due to previous nerve damage. Good symmetric motor tone is noted throughout.  Sensory: Sensory testing is intact to soft touch on all 4 extremities, decreased on the right due to nerve damage. No evidence of extinction is noted.  Coordination: Cerebellar testing reveals good finger-nose-finger and heel-to-shin bilaterally.  Gait and station: Gait is normal. Tandem gait is slightly unsteady. Romberg is negative. No drift is seen.  Reflexes: Deep tendon reflexes are symmetric and normal bilaterally.   DIAGNOSTIC DATA (LABS, IMAGING, TESTING) - I reviewed patient records, labs, notes, testing and imaging myself where available.  Lab Results  Component Value Date   WBC 12.2* 02/15/2014   HGB 9.1* 02/15/2014   HCT 27.7* 02/15/2014   MCV 93.3 02/15/2014   PLT 223 02/15/2014      Component Value Date/Time   NA 140 05/21/2014 1006   K 4.0 05/21/2014 1006   CL 105 05/21/2014 1006   CO2 29 05/21/2014 1006   GLUCOSE 102* 05/21/2014 1006   BUN 20 05/21/2014 1006   CREATININE 1.42* 05/21/2014 1006   CREATININE 1.40* 01/15/2014 1135   CALCIUM 10.4 05/21/2014 1006   PROT 6.3 02/19/2014 0955   ALBUMIN 3.4* 02/19/2014 0955   AST 14 02/19/2014 0955   ALT 11 02/19/2014 0955   ALKPHOS 55 02/19/2014 0955   BILITOT 0.6 02/19/2014 0955   GFRNONAA 30* 02/16/2014 0839   GFRAA 34* 02/16/2014 0839   Lab Results  Component Value Date   CHOL 134 02/19/2014   HDL 59.60 02/19/2014   LDLCALC 55 02/19/2014   TRIG 99.0 02/19/2014   CHOLHDL 2 02/19/2014       ASSESSMENT AND PLAN 69 y.o. year old female  has a past medical history of Irritable bowel syndrome; Hypertension;  Depression; Hypothyroidism; Internal hemorrhoids; Anxiety; Heart murmur; Hyperlipidemia; Vitamin D deficiency; Chronic low back pain; Carpal tunnel syndrome; History of non-ST elevation myocardial infarction (NSTEMI); S/P drug eluting coronary stent placement; GERD (gastroesophageal reflux disease); History of hiatal hernia; Partial seizure disorder (NEUROLOGIST-- DR Jannifer Franklin); History of supraventricular tachycardia; History of esophageal dilatation; Gross hematuria; Renal calculus, right; Arthritis; Cervical spondylosis without myelopathy; S/P pericardial cyst excision; Coronary artery disease (CARDIOLOGIST-  DR Irish Lack); PONV (postoperative nausea and vomiting); and History of gout. here with:  1. Seizures 2. Headache  Overall the patient is doing well. She will continue Keppra 500 mg twice a day. Patient's headaches have been controlled. We will continue to monitor. If the patient has any seizure events she should let us know. Follow-up in one year or sooner if needed.      Ward Givens, MSN, NP-C 07/29/2014, 9:57 AM [facility] [address] [place],  [address] [phone]  Note: This document was prepared with digital dictation and possible smart phrase technology. Any transcriptional errors that result from this process are unintentional.

## 2014-07-29 NOTE — Progress Notes (Signed)
I have read the note, and I agree with the clinical assessment and plan.  Latrina Guttman KEITH   

## 2014-08-07 ENCOUNTER — Other Ambulatory Visit: Payer: Self-pay | Admitting: Neurology

## 2014-08-28 ENCOUNTER — Other Ambulatory Visit: Payer: Self-pay

## 2014-08-29 ENCOUNTER — Other Ambulatory Visit: Payer: Self-pay | Admitting: *Deleted

## 2014-08-29 MED ORDER — ISOSORBIDE MONONITRATE ER 60 MG PO TB24: 90.0000 mg | ORAL_TABLET | Freq: Every day | ORAL | Status: DC

## 2014-09-12 ENCOUNTER — Other Ambulatory Visit: Payer: Self-pay | Admitting: Internal Medicine

## 2014-10-02 ENCOUNTER — Encounter: Payer: Self-pay | Admitting: Internal Medicine

## 2014-10-14 ENCOUNTER — Encounter (HOSPITAL_COMMUNITY): Payer: Self-pay | Admitting: *Deleted

## 2014-10-14 ENCOUNTER — Emergency Department (HOSPITAL_COMMUNITY): Payer: PPO

## 2014-10-14 ENCOUNTER — Inpatient Hospital Stay (HOSPITAL_COMMUNITY)
Admission: EM | Admit: 2014-10-14 | Discharge: 2014-10-16 | DRG: 303 | Disposition: A | Discharge status: Home / Self Care | Payer: PPO | Attending: Internal Medicine | Admitting: Internal Medicine

## 2014-10-14 DIAGNOSIS — Z8719 Personal history of other diseases of the digestive system: Secondary | ICD-10-CM

## 2014-10-14 DIAGNOSIS — K219 Gastro-esophageal reflux disease without esophagitis: Secondary | ICD-10-CM | POA: Diagnosis present

## 2014-10-14 DIAGNOSIS — F329 Major depressive disorder, single episode, unspecified: Secondary | ICD-10-CM | POA: Diagnosis present

## 2014-10-14 DIAGNOSIS — F319 Bipolar disorder, unspecified: Secondary | ICD-10-CM | POA: Diagnosis present

## 2014-10-14 DIAGNOSIS — F32A Depression, unspecified: Secondary | ICD-10-CM | POA: Diagnosis present

## 2014-10-14 DIAGNOSIS — Q248 Other specified congenital malformations of heart: Secondary | ICD-10-CM

## 2014-10-14 DIAGNOSIS — M545 Low back pain: Secondary | ICD-10-CM | POA: Diagnosis present

## 2014-10-14 DIAGNOSIS — Z7902 Long term (current) use of antithrombotics/antiplatelets: Secondary | ICD-10-CM | POA: Diagnosis not present

## 2014-10-14 DIAGNOSIS — R1011 Right upper quadrant pain: Secondary | ICD-10-CM | POA: Diagnosis not present

## 2014-10-14 DIAGNOSIS — R0602 Shortness of breath: Secondary | ICD-10-CM | POA: Diagnosis present

## 2014-10-14 DIAGNOSIS — Z955 Presence of coronary angioplasty implant and graft: Secondary | ICD-10-CM

## 2014-10-14 DIAGNOSIS — Z8669 Personal history of other diseases of the nervous system and sense organs: Secondary | ICD-10-CM

## 2014-10-14 DIAGNOSIS — E785 Hyperlipidemia, unspecified: Secondary | ICD-10-CM | POA: Diagnosis present

## 2014-10-14 DIAGNOSIS — R079 Chest pain, unspecified: Secondary | ICD-10-CM | POA: Diagnosis not present

## 2014-10-14 DIAGNOSIS — I2510 Atherosclerotic heart disease of native coronary artery without angina pectoris: Principal | ICD-10-CM | POA: Diagnosis present

## 2014-10-14 DIAGNOSIS — I252 Old myocardial infarction: Secondary | ICD-10-CM | POA: Diagnosis not present

## 2014-10-14 DIAGNOSIS — K589 Irritable bowel syndrome without diarrhea: Secondary | ICD-10-CM | POA: Diagnosis present

## 2014-10-14 DIAGNOSIS — Z79899 Other long term (current) drug therapy: Secondary | ICD-10-CM | POA: Diagnosis not present

## 2014-10-14 DIAGNOSIS — F419 Anxiety disorder, unspecified: Secondary | ICD-10-CM | POA: Diagnosis present

## 2014-10-14 DIAGNOSIS — Z7982 Long term (current) use of aspirin: Secondary | ICD-10-CM

## 2014-10-14 DIAGNOSIS — G4089 Other seizures: Secondary | ICD-10-CM | POA: Diagnosis present

## 2014-10-14 DIAGNOSIS — G8929 Other chronic pain: Secondary | ICD-10-CM | POA: Diagnosis present

## 2014-10-14 DIAGNOSIS — R109 Unspecified abdominal pain: Secondary | ICD-10-CM | POA: Diagnosis present

## 2014-10-14 DIAGNOSIS — E039 Hypothyroidism, unspecified: Secondary | ICD-10-CM | POA: Diagnosis present

## 2014-10-14 DIAGNOSIS — M109 Gout, unspecified: Secondary | ICD-10-CM | POA: Diagnosis present

## 2014-10-14 DIAGNOSIS — Z87891 Personal history of nicotine dependence: Secondary | ICD-10-CM | POA: Diagnosis not present

## 2014-10-14 DIAGNOSIS — I10 Essential (primary) hypertension: Secondary | ICD-10-CM | POA: Diagnosis present

## 2014-10-14 DIAGNOSIS — M1990 Unspecified osteoarthritis, unspecified site: Secondary | ICD-10-CM | POA: Diagnosis present

## 2014-10-14 DIAGNOSIS — R072 Precordial pain: Secondary | ICD-10-CM | POA: Diagnosis not present

## 2014-10-14 LAB — PROTIME-INR
INR: 1.2 (ref 0.00–1.49)
Prothrombin Time: 15.4 seconds — ABNORMAL HIGH (ref 11.6–15.2)

## 2014-10-14 LAB — BASIC METABOLIC PANEL
ANION GAP: 10 (ref 5–15)
BUN: 12 mg/dL (ref 6–20)
CHLORIDE: 105 mmol/L (ref 101–111)
CO2: 22 mmol/L (ref 22–32)
Calcium: 8.7 mg/dL — ABNORMAL LOW (ref 8.9–10.3)
Creatinine, Ser: 1.25 mg/dL — ABNORMAL HIGH (ref 0.44–1.00)
GFR, EST AFRICAN AMERICAN: 50 mL/min — AB (ref >60)
GFR, EST NON AFRICAN AMERICAN: 43 mL/min — AB (ref >60)
Glucose, Bld: 122 mg/dL — ABNORMAL HIGH (ref 65–99)
POTASSIUM: 3.7 mmol/L (ref 3.5–5.1)
SODIUM: 137 mmol/L (ref 135–145)

## 2014-10-14 LAB — CBC
HEMATOCRIT: 34.5 % — AB (ref 36.0–46.0)
HEMOGLOBIN: 11.3 g/dL — AB (ref 12.0–15.0)
MCH: 28.8 pg (ref 26.0–34.0)
MCHC: 32.8 g/dL (ref 30.0–36.0)
MCV: 87.8 fL (ref 78.0–100.0)
Platelets: 258 10*3/uL (ref 150–400)
RBC: 3.93 MIL/uL (ref 3.87–5.11)
RDW: 12.3 % (ref 11.5–15.5)
WBC: 5.8 10*3/uL (ref 4.0–10.5)

## 2014-10-14 LAB — LIPASE, BLOOD: LIPASE: 24 U/L (ref 22–51)

## 2014-10-14 LAB — TROPONIN I

## 2014-10-14 LAB — D-DIMER, QUANTITATIVE (NOT AT ARMC): D DIMER QUANT: 0.39 ug{FEU}/mL (ref 0.00–0.48)

## 2014-10-14 LAB — I-STAT TROPONIN, ED: Troponin i, poc: 0 ng/mL (ref 0.00–0.08)

## 2014-10-14 LAB — APTT: APTT: 29 s (ref 24–37)

## 2014-10-14 MED ORDER — DOXAZOSIN MESYLATE 2 MG PO TABS
2.0000 mg | ORAL_TABLET | Freq: Every day | ORAL | Status: DC
  Administered 2014-10-15 – 2014-10-16 (×2): 2 mg via ORAL
  Filled 2014-10-14 (×2): qty 1

## 2014-10-14 MED ORDER — ASPIRIN EC 81 MG PO TBEC
81.0000 mg | DELAYED_RELEASE_TABLET | Freq: Every day | ORAL | Status: DC
  Administered 2014-10-15 – 2014-10-16 (×2): 81 mg via ORAL
  Filled 2014-10-14 (×2): qty 1

## 2014-10-14 MED ORDER — ACETAMINOPHEN 650 MG RE SUPP: 650.0000 mg | Freq: Four times a day (QID) | RECTAL | Status: DC | PRN

## 2014-10-14 MED ORDER — ACETAMINOPHEN 325 MG PO TABS
650.0000 mg | ORAL_TABLET | Freq: Four times a day (QID) | ORAL | Status: DC | PRN
  Administered 2014-10-15: 650 mg via ORAL
  Filled 2014-10-14: qty 2

## 2014-10-14 MED ORDER — ASPIRIN EC 81 MG PO TBEC: 81.0000 mg | DELAYED_RELEASE_TABLET | Freq: Every day | ORAL | Status: DC

## 2014-10-14 MED ORDER — HYDROCODONE-ACETAMINOPHEN 5-325 MG PO TABS: 1.0000 | ORAL_TABLET | ORAL | Status: DC | PRN

## 2014-10-14 MED ORDER — HEPARIN SODIUM (PORCINE) 5000 UNIT/ML IJ SOLN
5000.0000 [IU] | Freq: Three times a day (TID) | INTRAMUSCULAR | Status: DC
  Administered 2014-10-14 – 2014-10-16 (×5): 5000 [IU] via SUBCUTANEOUS
  Filled 2014-10-14 (×5): qty 1

## 2014-10-14 MED ORDER — NITROGLYCERIN 0.4 MG SL SUBL
0.4000 mg | SUBLINGUAL_TABLET | SUBLINGUAL | Status: DC | PRN
  Administered 2014-10-14: 0.4 mg via SUBLINGUAL
  Filled 2014-10-14: qty 1

## 2014-10-14 MED ORDER — CLOPIDOGREL BISULFATE 75 MG PO TABS
75.0000 mg | ORAL_TABLET | Freq: Every day | ORAL | Status: DC
  Administered 2014-10-15 – 2014-10-16 (×2): 75 mg via ORAL
  Filled 2014-10-14 (×2): qty 1

## 2014-10-14 MED ORDER — METOPROLOL SUCCINATE ER 50 MG PO TB24: 50.0000 mg | ORAL_TABLET | Freq: Every day | ORAL | Status: DC

## 2014-10-14 MED ORDER — SODIUM CHLORIDE 0.9 % IV SOLN
INTRAVENOUS | Status: DC
  Administered 2014-10-14 – 2014-10-16 (×2): via INTRAVENOUS

## 2014-10-14 MED ORDER — SODIUM CHLORIDE 0.9 % IV BOLUS (SEPSIS)
500.0000 mL | Freq: Once | INTRAVENOUS | Status: AC
  Administered 2014-10-14: 500 mL via INTRAVENOUS

## 2014-10-14 MED ORDER — MORPHINE SULFATE (PF) 2 MG/ML IV SOLN: 2.0000 mg | INTRAVENOUS | Status: DC | PRN

## 2014-10-14 MED ORDER — ALPRAZOLAM 0.25 MG PO TABS
0.2500 mg | ORAL_TABLET | Freq: Every day | ORAL | Status: DC
  Administered 2014-10-14 – 2014-10-15 (×2): 0.25 mg via ORAL
  Filled 2014-10-14 (×2): qty 1

## 2014-10-14 MED ORDER — ISOSORBIDE MONONITRATE ER 60 MG PO TB24
90.0000 mg | ORAL_TABLET | Freq: Every day | ORAL | Status: DC
  Administered 2014-10-15 – 2014-10-16 (×2): 90 mg via ORAL
  Filled 2014-10-14 (×4): qty 1

## 2014-10-14 MED ORDER — ONDANSETRON HCL 4 MG/2ML IJ SOLN
4.0000 mg | Freq: Four times a day (QID) | INTRAMUSCULAR | Status: DC | PRN
Start: 2014-10-14 — End: 2014-10-16

## 2014-10-14 MED ORDER — METOPROLOL SUCCINATE ER 50 MG PO TB24
50.0000 mg | ORAL_TABLET | Freq: Every day | ORAL | Status: DC
  Administered 2014-10-15 – 2014-10-16 (×2): 50 mg via ORAL
  Filled 2014-10-14 (×2): qty 1

## 2014-10-14 MED ORDER — ONDANSETRON HCL 4 MG PO TABS
4.0000 mg | ORAL_TABLET | Freq: Four times a day (QID) | ORAL | Status: DC | PRN
Start: 2014-10-14 — End: 2014-10-16

## 2014-10-14 MED ORDER — LOSARTAN POTASSIUM 50 MG PO TABS
100.0000 mg | ORAL_TABLET | Freq: Every morning | ORAL | Status: DC
  Administered 2014-10-15 – 2014-10-16 (×2): 100 mg via ORAL
  Filled 2014-10-14 (×2): qty 2

## 2014-10-14 MED ORDER — PANTOPRAZOLE SODIUM 40 MG PO TBEC
40.0000 mg | DELAYED_RELEASE_TABLET | Freq: Two times a day (BID) | ORAL | Status: DC
  Administered 2014-10-14 – 2014-10-16 (×4): 40 mg via ORAL
  Filled 2014-10-14 (×4): qty 1

## 2014-10-14 MED ORDER — ROSUVASTATIN CALCIUM 20 MG PO TABS
20.0000 mg | ORAL_TABLET | Freq: Every morning | ORAL | Status: DC
  Administered 2014-10-15 – 2014-10-16 (×2): 20 mg via ORAL
  Filled 2014-10-14 (×2): qty 1

## 2014-10-14 MED ORDER — LEVETIRACETAM 500 MG PO TABS
500.0000 mg | ORAL_TABLET | Freq: Two times a day (BID) | ORAL | Status: DC
  Administered 2014-10-14 – 2014-10-16 (×4): 500 mg via ORAL
  Filled 2014-10-14 (×4): qty 1

## 2014-10-14 MED ORDER — LEVOTHYROXINE SODIUM 112 MCG PO TABS
112.0000 ug | ORAL_TABLET | Freq: Every day | ORAL | Status: DC
  Administered 2014-10-15 – 2014-10-16 (×2): 112 ug via ORAL
  Filled 2014-10-14 (×2): qty 1

## 2014-10-14 MED ORDER — CALCITRIOL 0.25 MCG PO CAPS
0.2500 ug | ORAL_CAPSULE | Freq: Every day | ORAL | Status: DC
  Administered 2014-10-15 – 2014-10-16 (×2): 0.25 ug via ORAL
  Filled 2014-10-14 (×2): qty 1

## 2014-10-14 MED ORDER — PAROXETINE HCL 20 MG PO TABS
30.0000 mg | ORAL_TABLET | Freq: Every day | ORAL | Status: DC
  Administered 2014-10-15 – 2014-10-16 (×2): 30 mg via ORAL
  Filled 2014-10-14 (×2): qty 1
  Filled 2014-10-14: qty 2
  Filled 2014-10-14: qty 1

## 2014-10-14 MED ORDER — PANTOPRAZOLE SODIUM 40 MG PO TBEC: 40.0000 mg | DELAYED_RELEASE_TABLET | Freq: Every day | ORAL | Status: DC

## 2014-10-14 MED ORDER — SODIUM CHLORIDE 0.9 % IJ SOLN
3.0000 mL | Freq: Two times a day (BID) | INTRAMUSCULAR | Status: DC
  Administered 2014-10-14 – 2014-10-15 (×2): 3 mL via INTRAVENOUS

## 2014-10-14 MED ORDER — DILTIAZEM HCL ER COATED BEADS 240 MG PO CP24
240.0000 mg | ORAL_CAPSULE | Freq: Every morning | ORAL | Status: DC
  Administered 2014-10-15 – 2014-10-16 (×2): 240 mg via ORAL
  Filled 2014-10-14 (×4): qty 1

## 2014-10-14 NOTE — ED Notes (Signed)
Attempted IV x 2 RN's, unsuccessful.

## 2014-10-14 NOTE — ED Provider Notes (Signed)
CSN: [id number]     Arrival date & time 10/14/14  1509 History   First MD Initiated Contact with Patient 10/14/14 1744     Chief Complaint  Patient presents with  . Chest Pain     (Consider location/radiation/quality/duration/timing/severity/associated sxs/prior Treatment) HPI  [name] is a 69 y.o. female with PMH significant for CAD, HLD, NSTEMI (2011) s/p stenting, last cath March 2014 who presents with 1 day history of substernal, stabbing, constant chest pain made worse with deep inspiration.  Endorses SOB, CP, DOE, and chills.  Denies N/V, fevers, abdominal pain, dysuria, hematuria, or bloody stools.  Denies recent infection.  She state that she did a lot of walking on Saturday, and then began experiencing this chest pain on "Sunday.  She states that this feels similar to her prior chest pain in 2011.  Her last stress test was one year ago.   Patient states she feels extremely out of breath.  No oxygen requirement at home.  She is on 2L of oxygen here and states that she doesn't feel like she can walk down the hallway without getting out of breath.     Past Medical History  Diagnosis Date  . Irritable bowel syndrome   . Hypertension   . Depression   . Hypothyroidism   . Internal hemorrhoids   . Anxiety   . Heart murmur   . Hyperlipidemia   . Vitamin D deficiency   . Chronic low back pain   . Carpal tunnel syndrome   . History of non-ST elevation myocardial infarction (NSTEMI)     2011--  S/P PCI WITH SENTING  . S/P drug eluting coronary stent placement     X3  in 2011, 2012, 2014  . GERD (gastroesophageal reflux disease)   . History of hiatal hernia   . Partial seizure disorder (HCC) NEUROLOGIST-- DR WILLIS    NOCTURNAL  . History of supraventricular tachycardia     2011-- resolved  . History of esophageal dilatation     FOR STRIUCTURE  . Gross hematuria   . Renal calculus, right   . Arthritis   . Cervical spondylosis without myelopathy   . S/P pericardial  cyst excision     01" -26-2015  benign  . Coronary artery disease CARDIOLOGIST-  DR Irish Lack    hx NSTEMI inferoposterior 2011 s/p PCI  total x3 DES's  . PONV (postoperative nausea and vomiting)   . History of gout    Past Surgical History  Procedure Laterality Date  . Mediasternotomy N/A 02/05/2013    Procedure: MEDIAN STERNOTOMY;  Surgeon: Ivin Poot, MD;  Location: [facility] OR;  Service: Thoracic;  Laterality: N/A;  . Biopsy of mediastinal mass N/A 02/05/2013    Procedure: RESECTION OF MEDIASTINAL MASS;  Surgeon: Ivin Poot, MD;  Location: [facility] OR;  Service: Thoracic;  Laterality: N/A;  . Ectopic pregnancy surgery  YRS AGO    SALPINGECTOMY  . Coronary angiogram  03/10/2011    Procedure: CORONARY ANGIOGRAM;  Surgeon: Jettie Booze, MD;  Location: [facility] CATH LAB;  Service: Cardiovascular;;  . Left heart catheterization with coronary angiogram N/A 03/14/2012    Procedure: LEFT HEART CATHETERIZATION WITH CORONARY ANGIOGRAM;  Surgeon: Sueanne Margarita, MD;  Location: [facility] CATH LAB;  Service: Cardiovascular;  Laterality: N/A;  Normal LM,  50% pLAD,  70% mLAD,  D2 50-70%, very tortuous LAD,  70-82% ostial LCFX,  50% in-stent restenosis of  dRCA and mRCA  stent and 50-70% ostial PDA,  normal LVSF,  ef 60%  . Percutaneous coronary stent intervention (pci-s) N/A 04/03/2012    Procedure: PERCUTANEOUS CORONARY STENT INTERVENTION (PCI-S);  Surgeon: Jettie Booze, MD;  Location: [facility] CATH LAB;  Service: Cardiovascular;  Laterality: N/A;   Successful PCI  mLAD with 2.75x12 Promus stent, postdilated to >0.21mm  . Coronary angioplasty with stent placement  11-22-2009  dr Irish Lack    Acute inferoposterior MI/  ef 60%,  PCI with DES x1 to dRCA,  25%  mLAD  . Coronary angioplasty with stent placement  06-26-2010  dr Daneen Schick    Cutting balloon angioplasty to dRCA with DES x1,  40% mLAD (non-obstructive CAD)  . Transthoracic echocardiogram  11-17-2011    grade I diastolic dysfunction/  ef 55-60%  .  Cardiovascular stress test  11-22-2013  dr Irish Lack    normal lexiscan study/  no ischemia/  normal LVF and wall motion/ ef 81%  . Esophagogastroduodenoscopy (egd) with esophageal dilation  05-20-2011  . Colonoscopy  last one 2014  . Needle guided excision breast calcifications Right 08-09-2008  . Cystoscopy with retrograde pyelogram, ureteroscopy and stent placement Right 02/13/2014    Procedure: CYSTOSCOPY WITH RETROGRADE PYELOGRAM, URETEROSCOPY AND STENT PLACEMENT;  Surgeon: Bernestine Amass, MD;  Location: [facility];  Service: Urology;  Laterality: Right;  . Holmium laser application Right 03/15/7423    Procedure: HOLMIUM LASER APPLICATION;  Surgeon: Bernestine Amass, MD;  Location[facility];  Service: Urology;  Laterality: Right;  . Esophagogastroduodenoscopy N/A 02/15/2014    Procedure: ESOPHAGOGASTRODUODENOSCOPY (EGD);  Surgeon: Jerene Bears, MD;  Location: [facility];  Service: Endoscopy;  Laterality: N/A;   Family History  Problem Relation Age of Onset  . Breast cancer Sister   . Cancer Sister     breast  . Breast cancer      niece  . Heart disease Father   . Hypertension Father   . Emphysema Father   . Coronary artery disease Father   . Colon cancer Neg Hx   . Heart attack Mother   . CVA Mother    Social History  Substance Use Topics  . Smoking status: Former Smoker -- 0.30 packs/day for 3 years    Types: Cigarettes    Start date: 11/08/1964    Quit date: 11/09/1967  . Smokeless tobacco: Never Used  . Alcohol Use: No   OB History    No data available     Review of Systems  All other systems negative unless otherwise stated in HPI   Allergies  Review of patient's allergies indicates no known allergies.  Home Medications   Prior to Admission medications   Medication Sig Start Date End Date Taking? Authorizing Provider  ALPRAZolam (XANAX) 0.25 MG tablet Take 0.25 mg by mouth at bedtime.    Yes Historical Provider, MD  aspirin EC  81 MG tablet Take 81 mg by mouth daily.   Yes Historical Provider, MD  calcitRIOL (ROCALTROL) 0.25 MCG capsule Take 0.25 mcg by mouth daily.   Yes Historical Provider, MD  clopidogrel (PLAVIX) 75 MG tablet Take 1 tablet (75 mg total) by mouth daily with breakfast. 07/18/14  Yes Sueanne Margarita, MD  diltiazem (MATZIM LA) 240 MG 24 hr tablet Take 240 mg by mouth every morning.    Yes Historical Provider, MD  doxazosin (CARDURA) 2 MG tablet Take 1 tablet (2 mg total) by mouth daily. 07/08/14  Yes Sueanne Margarita, MD  HYDROcodone-acetaminophen (NORCO/VICODIN) 5-325 MG per tablet Take 1-2 tablets  by mouth every 4 (four) hours as needed for moderate pain.   Yes Historical Provider, MD  isosorbide mononitrate (IMDUR) 60 MG 24 hr tablet Take 1.5 tablets (90 mg total) by mouth daily. 08/29/14  Yes Sueanne Margarita, MD  levETIRAcetam (KEPPRA) 500 MG tablet Take 1 tablet (500 mg total) by mouth 2 (two) times daily. 07/29/14  Yes Ward Givens, NP  levothyroxine (SYNTHROID, LEVOTHROID) 112 MCG tablet Take 112 mcg by mouth daily. 07/04/14  Yes Historical Provider, MD  losartan (COZAAR) 100 MG tablet Take 100 mg by mouth every morning.    Yes Historical Provider, MD  metoprolol succinate (TOPROL-XL) 50 MG 24 hr tablet TAKE 1 TABLET (50 MG TOTAL) BY MOUTH DAILY. TAKE WITH OR IMMEDIATELY FOLLOWING A MEAL. Patient taking differently: TAKE 1 TABLET (50 MG TOTAL) BY MOUTH DAILY. TAKE WITH OR IMMEDIATELY FOLLOWING A MEAL.---  takes in am 12/03/13  Yes Sueanne Margarita, MD  nitroGLYCERIN (NITROSTAT) 0.4 MG SL tablet Place 1 tablet (0.4 mg total) under the tongue every 5 (five) minutes as needed for chest pain. 06/27/14  Yes Sueanne Margarita, MD  pantoprazole (PROTONIX) 40 MG tablet TAKE 1 TABLET (40 MG TOTAL) BY MOUTH 2 (TWO) TIMES DAILY. 09/13/14  Yes Irene Shipper, MD  PARoxetine (PAXIL) 30 MG tablet Take 30 mg by mouth daily. 11/17/13  Yes Historical Provider, MD  potassium chloride SA (K-DUR,KLOR-CON) 20 MEQ tablet Take 20 mEq by mouth  2 (two) times daily.   Yes Historical Provider, MD  rosuvastatin (CRESTOR) 20 MG tablet Take 20 mg by mouth every morning.    Yes Historical Provider, MD   BP 107/60 mmHg  Pulse 58  Temp(Src) 98 F (36.7 C) (Oral)  Resp 21  SpO2 99% Physical Exam  Constitutional: She is oriented to person, place, and time. She appears well-developed and well-nourished.  HENT:  Head: Normocephalic and atraumatic.  Mouth/Throat: Oropharynx is clear and moist.  Eyes: Conjunctivae are normal. Pupils are equal, round, and reactive to light.  Neck: Normal range of motion. Neck supple.  Cardiovascular: Normal rate, regular rhythm, normal heart sounds and intact distal pulses.   No murmur heard. Pulmonary/Chest: Effort normal and breath sounds normal. No respiratory distress. She has no wheezes. She has no rales.  Abdominal: Soft. Bowel sounds are normal. She exhibits no distension. There is no tenderness.  Musculoskeletal: Normal range of motion.  Lymphadenopathy:    She has no cervical adenopathy.  Neurological: She is alert and oriented to person, place, and time.  Skin: Skin is warm and dry.  Psychiatric: She has a normal mood and affect. Her behavior is normal.    ED Course  Procedures (including critical care time) Labs Review Labs Reviewed  BASIC METABOLIC PANEL - Abnormal; Notable for the following:    Glucose, Bld 122 (*)    Creatinine, Ser 1.25 (*)    Calcium 8.7 (*)    GFR calc non Af Amer 43 (*)    GFR calc Af Amer 50 (*)    All other components within normal limits  CBC - Abnormal; Notable for the following:    Hemoglobin 11.3 (*)    HCT 34.5 (*)    All other components within normal limits  D-DIMER, QUANTITATIVE (NOT AT [facility])  I-STAT TROPOININ, ED    Imaging Review Dg Chest 2 View  10/14/2014   CLINICAL DATA:  Known cardiac history with 3 stents, recent over exertion, chest pain and rapid heart rate today.  EXAM: CHEST  2  VIEW  COMPARISON:  Chest x-ray dated 02/14/2014.   FINDINGS: Cardiomediastinal silhouette is stable in size and configuration. The cardiomegaly is unchanged. Median sternotomy wires are intact and stable in alignment. Elevation of the right hemidiaphragm is unchanged.  Stable mild scarring noted within the left lung, unchanged. No new lung findings seen. No acute osseous abnormality.  IMPRESSION: Stable chest x-ray. No evidence of acute cardiopulmonary abnormality.   Electronically Signed   By: Franki Cabot M.D.   On: 10/14/2014 16:05   I have personally reviewed and evaluated these images and lab results as part of my medical decision-making.   EKG Interpretation   Date/Time:  Monday October 14 2014 15:15:02 EDT Ventricular Rate:  69 PR Interval:  148 QRS Duration: 74 QT Interval:  406 QTC Calculation: 435 R Axis:   -56 Text Interpretation:  Normal sinus rhythm Left axis deviation Abnormal ECG  No significant change since last tracing Confirmed by Alvino Chapel  MD,  Ovid Curd [id number]) on 10/14/2014 6:05:56 PM      MDM   Final diagnoses:  Shortness of breath  Chest pain, unspecified chest pain type   Patient presents with chest pain x 2 days with SOB and DOE.  VSS, mild hypotension.  On exam, heart RRR, lungs CTAB, abdomen soft, non-tender, distal pulses.  Chest pain similar to chest pain in the past with NSTEMI.  Troponin negative.  CBC, BMP unremarkable.  CXR shows stable chest xray.  EKG similar to previous tracings.   Likely admit for chest pain and shortness of breath.  7:54 PM: Admit to medicine.  Gloriann Loan, PA-C 10/14/14 Cecilia, MD 10/16/14 [phone]

## 2014-10-14 NOTE — H&P (Signed)
[facility] History and Physical  [name] WUJ:[id number] DOB: 04/01/1945 DOA: 10/14/2014  Referring physician: ED physician PCP: Reginia Naas, MD  Specialists:   Chief Complaint: Chest pain, shortness of breath, abdominal pain  HPI: [name] is a 69 y.o. female with PMH of hypertension, hyperlipidemia, GERD, hypothyroidism, gout, depression, anxiety, CAD, as/p of stent placement, vitamin D deficiency, chronic back pain, partial seizure, esophageal stricture, post status of esophageal dilation, C-spine spondylosis, who presents with chest pain, SOB and abdominal pain.  Patient reports that she started having chest pain after did exercise in Saturday. The chest pain is located in the substernal area, constant, nonradiating. It is associated with shortness of breath. It is aggravated by deep breath, no cough, fever or chills. She also reports having soreness over her bi lateral calf areas. Patient has a chronic abdominal pain over right upper quadrant, which has been going on for a long time. She has loose stool, but reports that he is taking laxatives. Currently no abdominal pain, nausea or vomiting. She reports having chronic right arm weakness since she was a baby which has not changed.   In ED, patient was found to have negative d-dimer, negative troponin, WBC 5.8, temperature normal, no tachycardia, electrolytes okay, negative chest x-ray for acute abnormalities. Patient is admitted to inpatient for further eval and treatment.  Where does patient live?   At home     Can patient participate in ADLs?  Yes  Review of Systems:   General: no fevers, chills, no changes in body weight, has fatigue HEENT: no blurry vision, hearing changes or sore throat Pulm: has dyspnea, no coughing, wheezing CV: has chest pain, no palpitations Abd: no nausea, vomiting, has abdominal pain, no diarrhea, constipation GU: no dysuria, burning on urination, increased urinary frequency,  hematuria  Ext: no leg edema Neuro: no unilateral weakness, numbness, or tingling, no vision change or hearing loss Skin: no rash MSK: No muscle spasm, no deformity, no limitation of range of movement in spin Heme: No easy bruising.  Travel history: No recent long distant travel.  Allergy: No Known Allergies  Past Medical History  Diagnosis Date  . Irritable bowel syndrome   . Hypertension   . Depression   . Hypothyroidism   . Internal hemorrhoids   . Anxiety   . Heart murmur   . Hyperlipidemia   . Vitamin D deficiency   . Chronic low back pain   . Carpal tunnel syndrome   . History of non-ST elevation myocardial infarction (NSTEMI)     2011--  S/P PCI WITH SENTING  . S/P drug eluting coronary stent placement     X3  in 2011, 2012, 2014  . GERD (gastroesophageal reflux disease)   . History of hiatal hernia   . Partial seizure disorder (HCC) NEUROLOGIST-- DR WILLIS    NOCTURNAL  . History of supraventricular tachycardia     2011-- resolved  . History of esophageal dilatation     FOR STRIUCTURE  . Gross hematuria   . Renal calculus, right   . Arthritis   . Cervical spondylosis without myelopathy   . S/P pericardial cyst excision     02-05-2013  benign  . Coronary artery disease CARDIOLOGIST-  DR Irish Lack    hx NSTEMI inferoposterior 2011 s/p PCI  total x3 DES's  . PONV (postoperative nausea and vomiting)   . History of gout     Past Surgical History  Procedure Laterality Date  . Mediasternotomy N/A 02/05/2013  Procedure: MEDIAN STERNOTOMY;  Surgeon: Ivin Poot, MD;  Location: [facility] OR;  Service: Thoracic;  Laterality: N/A;  . Biopsy of mediastinal mass N/A 02/05/2013    Procedure: RESECTION OF MEDIASTINAL MASS;  Surgeon: Ivin Poot, MD;  Location: [facility] OR;  Service: Thoracic;  Laterality: N/A;  . Ectopic pregnancy surgery  YRS AGO    SALPINGECTOMY  . Coronary angiogram  03/10/2011    Procedure: CORONARY ANGIOGRAM;  Surgeon: Jettie Booze, MD;  Location:  [facility] CATH LAB;  Service: Cardiovascular;;  . Left heart catheterization with coronary angiogram N/A 03/14/2012    Procedure: LEFT HEART CATHETERIZATION WITH CORONARY ANGIOGRAM;  Surgeon: Sueanne Margarita, MD;  Location: [place] CATH LAB;  Service: Cardiovascular;  Laterality: N/A;  Normal LM,  50% pLAD,  70% mLAD,  D2 50-70%, very tortuous LAD,  70-82% ostial LCFX,  50% in-stent restenosis of  dRCA and mRCA  stent and 50-70% ostial PDA,  normal LVSF, ef 60%  . Percutaneous coronary stent intervention (pci-s) N/A 04/03/2012    Procedure: PERCUTANEOUS CORONARY STENT INTERVENTION (PCI-S);  Surgeon: Jettie Booze, MD;  Location: [facility] CATH LAB;  Service: Cardiovascular;  Laterality: N/A;   Successful PCI  mLAD with 2.75x12 Promus stent, postdilated to >0.67mm  . Coronary angioplasty with stent placement  11-22-2009  dr Irish Lack    Acute inferoposterior MI/  ef 60%,  PCI with DES x1 to dRCA,  25%  mLAD  . Coronary angioplasty with stent placement  06-26-2010  dr Daneen Schick    Cutting balloon angioplasty to dRCA with DES x1,  40% mLAD (non-obstructive CAD)  . Transthoracic echocardiogram  11-17-2011    grade I diastolic dysfunction/  ef 55-60%  . Cardiovascular stress test  11-22-2013  dr Irish Lack    normal lexiscan study/  no ischemia/  normal LVF and wall motion/ ef 81%  . Esophagogastroduodenoscopy (egd) with esophageal dilation  05-20-2011  . Colonoscopy  last one 2014  . Needle guided excision breast calcifications Right 08-09-2008  . Cystoscopy with retrograde pyelogram, ureteroscopy and stent placement Right 02/13/2014    Procedure: CYSTOSCOPY WITH RETROGRADE PYELOGRAM, URETEROSCOPY AND STENT PLACEMENT;  Surgeon: Bernestine Amass, MD;  Location: [facility];  Service: Urology;  Laterality: Right;  . Holmium laser application Right 05/17/4330    Procedure: HOLMIUM LASER APPLICATION;  Surgeon: Bernestine Amass, MD;  Location: [facility];  Service: Urology;  Laterality: Right;  .  Esophagogastroduodenoscopy N/A 02/15/2014    Procedure: ESOPHAGOGASTRODUODENOSCOPY (EGD);  Surgeon: Jerene Bears, MD;  Location: [facility];  Service: Endoscopy;  Laterality: N/A;    Social History:  reports that she quit smoking about 46 years ago. Her smoking use included Cigarettes. She started smoking about 49 years ago. She has a .9 pack-year smoking history. She has never used smokeless tobacco. She reports that she does not drink alcohol or use illicit drugs.  Family History:  Family History  Problem Relation Age of Onset  . Breast cancer Sister   . Cancer Sister     breast  . Breast cancer      niece  . Heart disease Father   . Hypertension Father   . Emphysema Father   . Coronary artery disease Father   . Colon cancer Neg Hx   . Heart attack Mother   . CVA Mother      Prior to Admission medications   Medication Sig Start Date End Date Taking? Authorizing Provider  ALPRAZolam Duanne Moron) 0.25 MG tablet  Take 0.25 mg by mouth at bedtime.    Yes Historical Provider, MD  aspirin EC 81 MG tablet Take 81 mg by mouth daily.   Yes Historical Provider, MD  calcitRIOL (ROCALTROL) 0.25 MCG capsule Take 0.25 mcg by mouth daily.   Yes Historical Provider, MD  clopidogrel (PLAVIX) 75 MG tablet Take 1 tablet (75 mg total) by mouth daily with breakfast. 07/18/14  Yes Sueanne Margarita, MD  diltiazem (MATZIM LA) 240 MG 24 hr tablet Take 240 mg by mouth every morning.    Yes Historical Provider, MD  doxazosin (CARDURA) 2 MG tablet Take 1 tablet (2 mg total) by mouth daily. 07/08/14  Yes Sueanne Margarita, MD  HYDROcodone-acetaminophen (NORCO/VICODIN) 5-325 MG per tablet Take 1-2 tablets by mouth every 4 (four) hours as needed for moderate pain.   Yes Historical Provider, MD  isosorbide mononitrate (IMDUR) 60 MG 24 hr tablet Take 1.5 tablets (90 mg total) by mouth daily. 08/29/14  Yes Sueanne Margarita, MD  levETIRAcetam (KEPPRA) 500 MG tablet Take 1 tablet (500 mg total) by mouth 2 (two) times daily. 07/29/14   Yes Ward Givens, NP  levothyroxine (SYNTHROID, LEVOTHROID) 112 MCG tablet Take 112 mcg by mouth daily. 07/04/14  Yes Historical Provider, MD  losartan (COZAAR) 100 MG tablet Take 100 mg by mouth every morning.    Yes Historical Provider, MD  metoprolol succinate (TOPROL-XL) 50 MG 24 hr tablet TAKE 1 TABLET (50 MG TOTAL) BY MOUTH DAILY. TAKE WITH OR IMMEDIATELY FOLLOWING A MEAL. Patient taking differently: TAKE 1 TABLET (50 MG TOTAL) BY MOUTH DAILY. TAKE WITH OR IMMEDIATELY FOLLOWING A MEAL.---  takes in am 12/03/13  Yes Sueanne Margarita, MD  nitroGLYCERIN (NITROSTAT) 0.4 MG SL tablet Place 1 tablet (0.4 mg total) under the tongue every 5 (five) minutes as needed for chest pain. 06/27/14  Yes Sueanne Margarita, MD  pantoprazole (PROTONIX) 40 MG tablet TAKE 1 TABLET (40 MG TOTAL) BY MOUTH 2 (TWO) TIMES DAILY. 09/13/14  Yes Irene Shipper, MD  PARoxetine (PAXIL) 30 MG tablet Take 30 mg by mouth daily. 11/17/13  Yes Historical Provider, MD  potassium chloride SA (K-DUR,KLOR-CON) 20 MEQ tablet Take 20 mEq by mouth 2 (two) times daily.   Yes Historical Provider, MD  rosuvastatin (CRESTOR) 20 MG tablet Take 20 mg by mouth every morning.    Yes Historical Provider, MD    Physical Exam: Filed Vitals:   10/14/14 2015 10/14/14 2030 10/14/14 2100 10/15/14 0352  BP: 114/71 118/62 135/67 147/55  Pulse: 60 56 61 55  Temp:   97.8 F (36.6 C) 97.7 F (36.5 C)  TempSrc:   Oral Oral  Resp: 19 15 18 20   Height:   4\' 11"  (1.499 m)   Weight:   65.998 kg (145 lb 8 oz) 66.044 kg (145 lb 9.6 oz)  SpO2: 98% 99% 100% 99%   General: Not in acute distress HEENT:       Eyes: PERRL, EOMI, no scleral icterus.       ENT: No discharge from the ears and nose, no pharynx injection, no tonsillar enlargement.        Neck: No JVD, no bruit, no mass felt. Heme: No neck lymph node enlargement. Cardiac: S1/S2, RRR, has 2/6 systolic murmurs, No gallops or rubs. Pulm: No rales, wheezing, rhonchi or rubs. Abd: Soft, nondistended, mild  tenderness over RUQ, no rebound pain, no organomegaly, BS present. Ext: No pitting leg edema bilaterally. 2+DP/PT pulse bilaterally. Musculoskeletal: No joint deformities, No  joint redness or warmth, no limitation of ROM in spin. Skin: No rashes.  Neuro: Alert, oriented X3, cranial nerves II-XII grossly intact, muscle strength 5/5 in all extremities, sensation to light touch intact.  Psych: Patient is not psychotic, no suicidal or hemocidal ideation.  Labs on Admission:  Basic Metabolic Panel:  Recent Labs Lab 10/14/14 1547 10/15/14 0311  NA 137 141  K 3.7 3.8  CL 105 108  CO2 22 26  GLUCOSE 122* 102*  BUN 12 12  CREATININE 1.25* 1.22*  CALCIUM 8.7* 8.7*   Liver Function Tests:  Recent Labs Lab 10/15/14 0311  AST 17  ALT 10*  ALKPHOS 83  BILITOT 0.5  PROT 5.8*  ALBUMIN 3.0*    Recent Labs Lab 10/14/14 2222  LIPASE 24   No results for input(s): AMMONIA in the last 168 hours. CBC:  Recent Labs Lab 10/14/14 1547  WBC 5.8  HGB 11.3*  HCT 34.5*  MCV 87.8  PLT 258   Cardiac Enzymes:  Recent Labs Lab 10/14/14 2222 10/15/14 0311  TROPONINI <0.03 <0.03    BNP (last 3 results)  Recent Labs  02/14/14 1203 10/15/14 0311  BNP 793.2* 193.2*    ProBNP (last 3 results) No results for input(s): PROBNP in the last 8760 hours.  CBG: No results for input(s): GLUCAP in the last 168 hours.  Radiological Exams on Admission: Dg Chest 2 View  10/14/2014   CLINICAL DATA:  Known cardiac history with 3 stents, recent over exertion, chest pain and rapid heart rate today.  EXAM: CHEST  2 VIEW  COMPARISON:  Chest x-ray dated 02/14/2014.  FINDINGS: Cardiomediastinal silhouette is stable in size and configuration. The cardiomegaly is unchanged. Median sternotomy wires are intact and stable in alignment. Elevation of the right hemidiaphragm is unchanged.  Stable mild scarring noted within the left lung, unchanged. No new lung findings seen. No acute osseous abnormality.   IMPRESSION: Stable chest x-ray. No evidence of acute cardiopulmonary abnormality.   Electronically Signed   By: Franki Cabot M.D.   On: 10/14/2014 16:05    EKG: Independently reviewed.  Abnormal findings: LAD, poor R-wave progression, which is similar to previous EKG on 02/14/14.  Assessment/Plan Principal Problem:   Chest pain Active Problems:   Coronary atherosclerosis   GERD   IBS   Hypertension   Hyperlipidemia   History of drug coated stent   History of  seizures   Depression   Pericardial cyst   History of esophageal stricture   Shortness of breath   Abdominal pain   Chest pain and CAD:  Etiology is not clear. No pneumonia on chest x-ray. D-dimer is negative, unlikely to have PE. Pt is s/p of stent. We'll admit for chest pain rule out.  - will admit to Tele bed  - cycle CE q6 x3 and repeat her EKG in the am  - Nitroglycerin, Morphine, and aspirin, plavix, Crestor, and metoprolol, Imdur - Risk factor stratification: will check FLP and A1C  - Consider cardiology consult if test positive for CEs  - 2d echo - LE doppler to r/o DVT  Abdominal pain: Patient has right upper quadrant abdominal pain for a long time. Etiology is not clear. She has hx of IBS, which may have contributed. Other differential diagnosis includes pancreatitis and biliary system problems -Check lipase -check US-abd  GERD: -Protonix  HTN: -diltiazem, Cozaar, metoprolol  Partial seizure: well controled. Last seizure was 30 years ago. -Continue Keppra -Check Keppra level  HLD: Last LDL was 55  on 02/19/14 -Continue home medications: Crestor -Check FLP   DVT ppx: Sq Heparin  Code Status: Partial code (CPR OK, but not intubation) Family Communication:  Yes, patient's husband at bed side Disposition Plan: Admit to inpatient   Date of Service 10/15/2014    Ivor Costa [facility] Pager [phone]  If 7PM-7AM, please contact night-coverage [email or web address] Password [email or web address] 10/15/2014, 5:32  AM

## 2014-10-14 NOTE — Progress Notes (Signed)
Admitted pt to rm 3E30 from ED, pt alert and oriented, denied pain at this time, oriented to room, call bell placed within reach, admission assessment done, orders carried out. Family at bedside.

## 2014-10-14 NOTE — ED Notes (Signed)
Pt has known cardiac history with 3 stents and may have over exerted on Saturday.  Pt clinching chest w/ complaints of chest pain and fast heart beat.

## 2014-10-15 ENCOUNTER — Inpatient Hospital Stay (HOSPITAL_COMMUNITY): Payer: PPO

## 2014-10-15 ENCOUNTER — Other Ambulatory Visit: Payer: Self-pay | Admitting: Student

## 2014-10-15 DIAGNOSIS — R079 Chest pain, unspecified: Secondary | ICD-10-CM | POA: Insufficient documentation

## 2014-10-15 DIAGNOSIS — R0602 Shortness of breath: Secondary | ICD-10-CM

## 2014-10-15 DIAGNOSIS — I200 Unstable angina: Secondary | ICD-10-CM

## 2014-10-15 DIAGNOSIS — R1011 Right upper quadrant pain: Secondary | ICD-10-CM | POA: Insufficient documentation

## 2014-10-15 DIAGNOSIS — R109 Unspecified abdominal pain: Secondary | ICD-10-CM | POA: Diagnosis present

## 2014-10-15 LAB — COMPREHENSIVE METABOLIC PANEL
ALBUMIN: 3 g/dL — AB (ref 3.5–5.0)
ALT: 10 U/L — ABNORMAL LOW (ref 14–54)
ANION GAP: 7 (ref 5–15)
AST: 17 U/L (ref 15–41)
Alkaline Phosphatase: 83 U/L (ref 38–126)
BILIRUBIN TOTAL: 0.5 mg/dL (ref 0.3–1.2)
BUN: 12 mg/dL (ref 6–20)
CO2: 26 mmol/L (ref 22–32)
Calcium: 8.7 mg/dL — ABNORMAL LOW (ref 8.9–10.3)
Chloride: 108 mmol/L (ref 101–111)
Creatinine, Ser: 1.22 mg/dL — ABNORMAL HIGH (ref 0.44–1.00)
GFR calc Af Amer: 52 mL/min — ABNORMAL LOW (ref >60)
GFR calc non Af Amer: 44 mL/min — ABNORMAL LOW (ref >60)
GLUCOSE: 102 mg/dL — AB (ref 65–99)
POTASSIUM: 3.8 mmol/L (ref 3.5–5.1)
SODIUM: 141 mmol/L (ref 135–145)
TOTAL PROTEIN: 5.8 g/dL — AB (ref 6.5–8.1)

## 2014-10-15 LAB — GLUCOSE, CAPILLARY
GLUCOSE-CAPILLARY: 97 mg/dL (ref 65–99)
Glucose-Capillary: 91 mg/dL (ref 65–99)
Glucose-Capillary: 95 mg/dL (ref 65–99)

## 2014-10-15 LAB — LIPID PANEL
CHOL/HDL RATIO: 2.1 ratio
Cholesterol: 140 mg/dL (ref 0–200)
HDL: 67 mg/dL (ref >40)
LDL Cholesterol: 59 mg/dL (ref 0–99)
Triglycerides: 72 mg/dL (ref <150)
VLDL: 14 mg/dL (ref 0–40)

## 2014-10-15 LAB — TSH: TSH: 0.221 u[IU]/mL — AB (ref 0.350–4.500)

## 2014-10-15 LAB — TROPONIN I
Troponin I: <0.03 ng/mL (ref <0.031)
Troponin I: <0.03 ng/mL

## 2014-10-15 LAB — RAPID URINE DRUG SCREEN, HOSP PERFORMED
Amphetamines: NOT DETECTED
BARBITURATES: NOT DETECTED
BENZODIAZEPINES: POSITIVE — AB
COCAINE: NOT DETECTED
Opiates: POSITIVE — AB
Tetrahydrocannabinol: NOT DETECTED

## 2014-10-15 LAB — BRAIN NATRIURETIC PEPTIDE: B NATRIURETIC PEPTIDE 5: 193.2 pg/mL — AB (ref 0.0–100.0)

## 2014-10-15 MED ORDER — TECHNETIUM TC 99M MEBROFENIN IV KIT
5.0000 | PACK | Freq: Once | INTRAVENOUS | Status: DC | PRN
  Administered 2014-10-15: 5 via INTRAVENOUS
  Filled 2014-10-15: qty 6

## 2014-10-15 MED ORDER — STERILE WATER FOR INJECTION IJ SOLN
INTRAMUSCULAR | Status: AC
  Administered 2014-10-15: 5 mL
  Filled 2014-10-15: qty 10

## 2014-10-15 MED ORDER — SINCALIDE 5 MCG IJ SOLR
INTRAMUSCULAR | Status: AC
  Administered 2014-10-15: 1.3 ug via INTRAVENOUS
  Filled 2014-10-15: qty 5

## 2014-10-15 MED ORDER — SINCALIDE 5 MCG IJ SOLR
0.0200 ug/kg | Freq: Once | INTRAMUSCULAR | Status: AC
  Administered 2014-10-15: 1.3 ug via INTRAVENOUS
  Filled 2014-10-15: qty 5

## 2014-10-15 NOTE — Progress Notes (Signed)
*  PRELIMINARY RESULTS* Echocardiogram 2D Echocardiogram has been performed.  Leavy Cella 10/15/2014, 11:27 AM

## 2014-10-15 NOTE — Progress Notes (Signed)
PROGRESS NOTE  [name] CXK:[id number] DOB: [birth year] DOA: 10/14/2014 PCP: Reginia Naas, MD  Brief history 69 year old female with a history of hypertension, depression, hyperlipidemia, esophageal stricture status post dilatation 02/15/2014, chronic artery disease status post DES most recently 03/24/2014Presented with 1-2 day history of chest discomfort, shortness breath, and right upper quadrant abdominal pain. The patient states that her chest discomfort is worsening by taking a deep breath. She denies any cough, hemoptysis. She had her husband had a day trip on 10/12/14 with lots of walking during which she had complained of increasing shortness of breath with exertion. She denies any vomiting or diarrhea but complains of intermittent nausea. In addition, patient has been complaining of right upper quadrant pain that has been present for over one year.  Assessment/Plan: Atypical Chest pain -D-dimer neg -EKG without concerning ischemic changes -Echo -troponins neg x 2 -05/23/2014 echocardiogram EF 60-65%, grade 1 DD, no WMA -Presently chest pain-free -consulted cardiology RUQ abdominal pain -Abdominal ultrasound shows gallbladder polyps without any gallbladder wall thickening, echogenic liver -HIDA scan -hx of IBS CAD with hx of DES -most recent DES on 04/03/12 -Continue aspirin and Plavix  -Continue metoprolol succinate  -pericardial cyst resection Jan 2015 Partial Seizure -continue Keppra -follow up Dr. Jannifer Franklin -last seizure 30 yrs ago Hypertension -Continue medical Toprol succinate and diltiazem CD GERD/History of esophageal stricture -02/15/2014 EGD showed nonobstructive Schatzki's ring -Continue PPI Hypothyroidism -Continue Synthroid Bipolar disorder -Continue Paxil 30 mg daily, Xanax 0.25 mg at bedtime Hyperlipidemia -Continue Crestor   Family Communication:   Husband updated at beside Disposition Plan:   Home when medically  stable      Procedures/Studies: Dg Chest 2 View  10/14/2014   CLINICAL DATA:  Known cardiac history with 3 stents, recent over exertion, chest pain and rapid heart rate today.  EXAM: CHEST  2 VIEW  COMPARISON:  Chest x-ray dated 02/14/2014.  FINDINGS: Cardiomediastinal silhouette is stable in size and configuration. The cardiomegaly is unchanged. Median sternotomy wires are intact and stable in alignment. Elevation of the right hemidiaphragm is unchanged.  Stable mild scarring noted within the left lung, unchanged. No new lung findings seen. No acute osseous abnormality.  IMPRESSION: Stable chest x-ray. No evidence of acute cardiopulmonary abnormality.   Electronically Signed   By: Franki Cabot M.D.   On: 10/14/2014 16:05   US Abdomen Complete  10/15/2014   CLINICAL DATA:  Abdominal pain  EXAM: ULTRASOUND ABDOMEN COMPLETE  COMPARISON:  02/08/2014  FINDINGS: Gallbladder: Well distended without evidence of gallbladder wall thickening. Scattered polyps are noted. No calculi are seen.  Common bile duct: Diameter: 6.3 mm.  Liver: Scattered areas of increased echogenicity are noted. These were not well appreciated on the prior CT examination. They may represent small hemangiomas. The largest of these measures 1.1 cm in greatest dimension.  IVC: No abnormality visualized.  Pancreas: Visualized portion unremarkable.  Spleen: Size and appearance within normal limits.  Right Kidney: Length: 9.8 cm.  Diffuse cortical thinning is noted.  Left Kidney: Length: 9.0 cm.  These cortical thinning is noted.  Abdominal aorta: No aneurysm visualized.  Other findings: None.  IMPRESSION: Bilateral cortical thinning in the kidneys.  Multiple gallbladder polyps.  Echogenic foci within the liver which may represent small hemangiomas. Comparison with the recent CT shows a area of invagination of fat in the expected region of this lesion. This may simply represent some fatty tissue.   Electronically Signed   By: Elta Guadeloupe  Lukens M.D.    On: 10/15/2014 07:54         Subjective:  patient continues to plan of bradycardia and abdominal pain without any vomiting, diarrhea. There is no hematochezia or melena. She denies any chest discomfort or shortness of breath. His been no fever, chills, headaches no dysuria or hematuria.   Objective: Filed Vitals:   10/14/14 2030 10/14/14 2100 10/15/14 0352 10/15/14 0730  BP: 118/62 135/67 147/55 139/72  Pulse: 56 61 55 66  Temp:  97.8 F (36.6 C) 97.7 F (36.5 C) 98.4 F (36.9 C)  TempSrc:  Oral Oral Oral  Resp: 15 18 20 20   Height:  4\' 11"  (1.499 m)    Weight:  65.998 kg (145 lb 8 oz) 66.044 kg (145 lb 9.6 oz)   SpO2: 99% 100% 99% 100%    Intake/Output Summary (Last 24 hours) at 10/15/14 0940 Last data filed at 10/15/14 0900  Gross per 24 hour  Intake    605 ml  Output    800 ml  Net   -195 ml   Weight change:  Exam:   General:  Pt is alert, follows commands appropriately, not in acute distress  HEENT: No icterus, No thrush, No neck mass, [place]/AT  Cardiovascular: RRR, S1/S2, no rubs, no gallops  Respiratory: CTA bilaterally, no wheezing, no crackles, no rhonchi  Abdomen: Soft/+BS, non tender, non distended, no guarding  Extremities: No edema, No lymphangitis, No petechiae, No rashes, no synovitis  Data Reviewed: Basic Metabolic Panel:  Recent Labs Lab 10/14/14 1547 10/15/14 0311  NA 137 141  K 3.7 3.8  CL 105 108  CO2 22 26  GLUCOSE 122* 102*  BUN 12 12  CREATININE 1.25* 1.22*  CALCIUM 8.7* 8.7*   Liver Function Tests:  Recent Labs Lab 10/15/14 0311  AST 17  ALT 10*  ALKPHOS 83  BILITOT 0.5  PROT 5.8*  ALBUMIN 3.0*    Recent Labs Lab 10/14/14 2222  LIPASE 24   No results for input(s): AMMONIA in the last 168 hours. CBC:  Recent Labs Lab 10/14/14 1547  WBC 5.8  HGB 11.3*  HCT 34.5*  MCV 87.8  PLT 258   Cardiac Enzymes:  Recent Labs Lab 10/14/14 2222 10/15/14 0311  TROPONINI <0.03 <0.03   BNP: Invalid input(s):  POCBNP CBG:  Recent Labs Lab 10/15/14 0731 10/15/14 0902  GLUCAP 97 91    No results found for this or any previous visit (from the past 240 hour(s)).   Scheduled Meds: . ALPRAZolam  0.25 mg Oral QHS  . aspirin EC  81 mg Oral Daily  . calcitRIOL  0.25 mcg Oral Daily  . clopidogrel  75 mg Oral Q breakfast  . diltiazem  240 mg Oral q morning - 10a  . doxazosin  2 mg Oral Daily  . heparin  5,000 Units Subcutaneous 3 times per day  . isosorbide mononitrate  90 mg Oral Daily  . levETIRAcetam  500 mg Oral BID  . levothyroxine  112 mcg Oral QAC breakfast  . losartan  100 mg Oral q morning - 10a  . metoprolol succinate  50 mg Oral Daily  . pantoprazole  40 mg Oral BID  . PARoxetine  30 mg Oral Daily  . rosuvastatin  20 mg Oral q morning - 10a  . sodium chloride  3 mL Intravenous Q12H   Continuous Infusions: . sodium chloride 75 mL/hr at 10/14/14 2256     Valor Quaintance, DO  [facility] Pager [phone]  If  7PM-7AM, please contact night-coverage [email or web address] Password [email or web address] 10/15/2014, 9:40 AM   LOS: 1 day

## 2014-10-15 NOTE — Discharge Instructions (Signed)
You have a Stress Test scheduled at [facility]. Your doctor has ordered this test to get a better idea of how your heart works.  Please arrive 15 minutes early for paperwork.   Location: 10/29/2014 at 10:00AM [address] [place], [place] [address] [phone]  Instructions:  No food/drink after midnight the night before.   No caffeine/decaf products 24 hours before, including medicines such as Excedrin or Goody Powders. Call if there are any questions.   Wear comfortable clothes and shoes.   It is OK to take your morning meds with a sip of water EXCEPT for those types of medicines listed below or otherwise instructed.  Special Medication Instructions:  Beta blockers such as metoprolol (Lopressor/Toprol XL), atenolol (Tenormin), carvedilol (Coreg), nebivolol (Bystolic), propranolol (Inderal) should not be taken for 24 hours before the test.  Calcium channel blockers such as diltiazem (Cardizem) or verapmil (Calan) should not be taken for 24 hours before the test.  Remove nitroglycerin patches and do not take nitrate preparations such as Imdur/isosorbide the day of your test.  No Persantine/Theophylline or Aggrenox medicines should be used within 24 hours of the test.   What To Expect: The whole test will take several hours. When you arrive in the lab, the technician will inject a small amount of radioactive tracer into your arm through an IV while you are resting quietly. This helps [place] to form pictures of your heart. You will likely only feel a sting from the IV. After a waiting period, resting pictures will be obtained under a big camera. These are the "before" pictures.  Next, you will be prepped for the stress portion of the test. This may include either walking on a treadmill or receiving a medicine that helps to dilate blood vessels in your heart to simulate the effect of exercise on your heart. If you are walking on a treadmill, you will walk  at different paces to try to get your heart rate to a goal number that is based on your age. If your doctor has chosen the pharmacologic test, then you will receive a medicine through your IV that may cause temporary nausea, flushing, shortness of breath and sometimes chest discomfort or vomiting. This is typically short-lived and usually resolves quickly. Your blood pressure and heart rate will be monitored, and we will be watching your EKG on a computer screen for any changes. During this portion of the test, the radiologist will inject another small amount of radioactive tracer into your IV. After a waiting period, you will undergo a second set of pictures. These are the "after" pictures.  The doctor reading the test will compare the before-and-after images to look for evidence of heart blockages or heart weakness. In certain instances, this test is done over 2 days but usually only takes 1 day to complete.

## 2014-10-15 NOTE — Progress Notes (Signed)
PT Cancellation Note  Patient Details Name: [name] MRN: [id number] DOB: 06/02/1945   Cancelled Treatment:    Reason Eval/Treat Not Completed: Patient at procedure or test/unavailable (attempted to see pt x 2, first time echo to be initiated and now pt to be transferred to nuc med. Will attempt next date)   Melford Aase 10/15/2014, 11:24 AM Elwyn Reach, [place]

## 2014-10-15 NOTE — Progress Notes (Signed)
OT Cancellation Note  Patient Details Name: [name] MRN: [id number] DOB: 1945-04-18   Cancelled Treatment:    Reason Eval/Treat Not Completed: Patient at procedure or test/ unavailable. Have attempted to see pt x 2, will re attempt tomorrow  Britt Bottom 10/15/2014, 12:10 PM

## 2014-10-15 NOTE — Progress Notes (Signed)
*  Preliminary Results* Bilateral lower extremity venous duplex completed. Bilateral lower extremities are negative for deep vein thrombosis. There is no evidence of Baker's cyst bilaterally.  10/15/2014  Maudry Mayhew, RVT, RDCS, RDMS

## 2014-10-15 NOTE — Consult Note (Signed)
CARDIOLOGY CONSULT NOTE   Patient ID: [name] MRN: [id number], DOB/AGE: September 30, 1945   Admit date: 10/14/2014 Date of Consult: 10/15/2014 Reason for  Consult: Chest Pain   Primary Physician: Reginia Naas, MD Primary Cardiologist: Dr. Radford Pax  HPI: [name] is a 69 y.o. female with past medical history of CAD (s/p multiple DES with most recent DES to mid LAD in 03/2012) , HTN, HLD, pericardial cyst (s/p median sternotomy 01/2013) who presented to [facility] ED on 10/14/2014 for chest pain and shortness of breath.  She reports walking more than normal on 10/12/2014 while [place] an apple festival. The next day she felt fatigued and had a mild pain along her sternum which she reports was worse with taking deep breaths. On 10/14/2014 the pain continued to increase in intensity to a 9/10 at its worse, and was relieved with SL NTG. Upon being admitted, she is without chest pain at rest but reports having mild pain with deep breaths. She reports having some shortness of breath since Sunday, but notes it has improved. Denies any radiating pain, nausea, or vomiting. Notes having chills over the weekend. Also endorses having a transient numbness in her hands bilaterally that presented itself last night.  She endorses having chest pressure even with walking up one flight of stairs while at home over the past several months but does not typically use her SL NTG for this. She reports the pain that brought her into the ED yesterday was worse than her "normal chest pressure". She also reports having dyspnea on exertion for the past few years.  She also reports having upper right quadrant abdominal pain for the past year. She is currently being worked up for this by Internal Medicine.  She was last seen in the office by Dr. Turner in 05/2014 and reported having occasional mid-sternal chest pain at that time that did not radiate. She was also having dyspnea on exertion that was thought to be secondary  to deconditioning. An echocardiogram was obtained showing an EF of 60% to 65%, grade 1 diastolic dysfunction, mild to moderate mitral regurgitation, and no wall motion abnormalities.   Last cardiac catheterization was in 03/2012 showing Normal left main coronary artery, 50% proximal and 70% mid stenosis of the LAD and distally at takeoff of second diagonal there is a 50-70% stenosis in a very tortuous LAD, 70-80% ostial stenosis of the left circumflex in a small vessel and 50% instent restenosis of the mid to distal portion of the RCA stent and 50-70% stenosis of the ostial PDA. She underwent staged PCI with DES to mid LAD later that month.  Had NST in 11/2013 which showed no evidence of ischemia.   Problem List  Past Medical History  Diagnosis Date  . Irritable bowel syndrome   . Hypertension   . Depression   . Hypothyroidism   . Internal hemorrhoids   . Anxiety   . Heart murmur   . Hyperlipidemia   . Vitamin D deficiency   . Chronic low back pain   . Carpal tunnel syndrome   . History of non-ST elevation myocardial infarction (NSTEMI)     20 11--  S/P PCI WITH SENTING  . S/P drug eluting coronary stent placement     X3  in 2011, 2012, 2014  . GERD (gastroesophageal reflux disease)   . History of hiatal hernia   . Partial seizure disorder (HCC) NEUROLOGIST-- DR WILLIS    NOCTURNAL  . History of supraventricular tachycardia  2011-- resolved  . History of esophageal dilatation     FOR STRIUCTURE  . Gross hematuria   . Renal calculus, right   . Arthritis   . Cervical spondylosis without myelopathy   . S/P pericardial cyst excision     02-05-2013  benign  . Coronary artery disease CARDIOLOGIST-  DR Irish Lack    hx NSTEMI inferoposterior 2011 s/p PCI  total x3 DES's  . PONV (postoperative nausea and vomiting)   . History of gout     Past Surgical History  Procedure Laterality Date  . Mediasternotomy N/A 02/05/2013    Procedure: MEDIAN STERNOTOMY;  Surgeon: Ivin Poot,  MD;  Location: [facility] OR;  Service: Thoracic;  Laterality: N/A;  . Biopsy of mediastinal mass N/A 02/05/2013    Procedure: RESECTION OF MEDIASTINAL MASS;  Surgeon: Ivin Poot, MD;  Location: [facility] OR;  Service: Thoracic;  Laterality: N/A;  . Ectopic pregnancy surgery  YRS AGO    SALPINGECTOMY  . Coronary angiogram  03/10/2011    Procedure: CORONARY ANGIOGRAM;  Surgeon: Jettie Booze, MD;  Location: [facility] CATH LAB;  Service: Cardiovascular;;  . Left heart catheterization with coronary angiogram N/A 03/14/2012    Procedure: LEFT HEART CATHETERIZATION WITH CORONARY ANGIOGRAM;  Surgeon: Sueanne Margarita, MD;  Location: [facility] CATH LAB;  Service: Cardiovascular;  Laterality: N/A;  Normal LM,  50% pLAD,  70% mLAD,  D2 50-70%, very tortuous LAD,  70-82% ostial LCFX,  50% in-stent restenosis of  dRCA and mRCA  stent and 50-70% ostial PDA,  normal LVSF, ef 60%  . Percutaneous coronary stent intervention (pci-s) N/A 04/03/2012    Procedure: PERCUTANEOUS CORONARY STENT INTERVENTION (PCI-S);  Surgeon: Jettie Booze, MD;  Location: [facility] CATH LAB;  Service: Cardiovascular;  Laterality: N/A;   Successful PCI  mLAD with 2.75x12 Promus stent, postdilated to >0.76mm  . Coronary angioplasty with stent placement  11-22-2009  dr Irish Lack    Acute inferoposterior MI/  ef 60%,  PCI with DES x1 to dRCA,  25%  mLAD  . Coronary angioplasty with stent placement  06-26-2010  dr Daneen Schick    Cutting balloon angioplasty to dRCA with DES x1,  40% mLAD (non-obstructive CAD)  . Transthoracic echocardiogram  11-17-2011    grade I diastolic dysfunction/  ef 55-60%  . Cardiovascular stress test  11-22-2013  dr Irish Lack    normal lexiscan study/  no ischemia/  normal LVF and wall motion/ ef 81%  . Esophagogastroduodenoscopy (egd) with esophageal dilation  05-20-2011  . Colonoscopy  last one 2014  . Needle guided excision breast calcifications Right 08-09-2008  . Cystoscopy with retrograde pyelogram, ureteroscopy and stent placement  Right 02/13/2014    Procedure: CYSTOSCOPY WITH RETROGRADE PYELOGRAM, URETEROSCOPY AND STENT PLACEMENT;  Surgeon: Bernestine Amass, MD;  Location: [facility];  Service: Urology;  Laterality: Right;  . Holmium laser application Right 07/19/9379    Procedure: HOLMIUM LASER APPLICATION;  Surgeon: Bernestine Amass, MD;  Location: [facility];  Service: Urology;  Laterality: Right;  . Esophagogastroduodenoscopy N/A 02/15/2014    Procedure: ESOPHAGOGASTRODUODENOSCOPY (EGD);  Surgeon: Jerene Bears, MD;  Location: [facility];  Service: Endoscopy;  Laterality: N/A;     Allergies No Known Allergies    Inpatient Medications . ALPRAZolam  0.25 mg Oral QHS  . aspirin EC  81 mg Oral Daily  . calcitRIOL  0.25 mcg Oral Daily  . clopidogrel  75 mg Oral Q breakfast  . diltiazem  240 mg  Oral q morning - 10a  . doxazosin  2 mg Oral Daily  . heparin  5,000 Units Subcutaneous 3 times per day  . isosorbide mononitrate  90 mg Oral Daily  . levETIRAcetam  500 mg Oral BID  . levothyroxine  112 mcg Oral QAC breakfast  . losartan  100 mg Oral q morning - 10a  . metoprolol succinate  50 mg Oral Daily  . pantoprazole  40 mg Oral BID  . PARoxetine  30 mg Oral Daily  . rosuvastatin  20 mg Oral q morning - 10a  . sodium chloride  3 mL Intravenous Q12H    Family History Family History  Problem Relation Age of Onset  . Breast cancer Sister   . Cancer Sister     breast  . Breast cancer      niece  . Heart disease Father   . Hypertension Father   . Emphysema Father   . Coronary artery disease Father   . Colon cancer Neg Hx   . Heart attack Mother   . CVA Mother      Social History Social History   Social History  . Marital Status: Married    Spouse Name: N/A  . Number of Children: 0  . Years of Education: hs   Occupational History  . Retired    Social History Main Topics  . Smoking status: Former Smoker -- 0.30 packs/day for 3 years    Types: Cigarettes    Start date:  11/08/1964    Quit date: 11/09/1967  . Smokeless tobacco: Never Used  . Alcohol Use: No  . Drug Use: No  . Sexual Activity: Not Currently   Other Topics Concern  . Not on file   Social History Narrative   Daily caffeine use: coffee.     Review of Systems General:  No fever, night sweats or weight changes. Positive for chills. Cardiovascular:  No edema, orthopnea, palpitations, paroxysmal nocturnal dyspnea. Positive for chest pain and dyspnea on exertion. Dermatological: No rash, lesions/masses Respiratory: No cough Positive for dyspnea Urologic: No hematuria, dysuria Abdominal:   No nausea, vomiting, diarrhea, bright red blood per rectum, melena, or hematemesis Neurologic:  No visual changes, wkns, changes in mental status. All other systems reviewed and are otherwise negative except as noted above.  Physical Exam Blood pressure 139/72, pulse 66, temperature 98.4 F (36.9 C), temperature source Oral, resp. rate 20, height 4\' 11"  (1.499 m), weight 145 lb 9.6 oz (66.044 kg), SpO2 100 %.  General: Pleasant, Caucasian female in NAD. On nasal cannula.  Psych: Normal affect. Neuro: Alert and oriented X 3. Moves all extremities spontaneously. HEENT: Normal  Neck: Supple without bruits or JVD. Lungs:  Resp regular and unlabored, CTA without wheezing or rales. Heart: RRR no s3, s4, 2/6 SEM noted. Tender to palpation along sternum. Abdomen: Soft, non-tender, non-distended, BS + x 4.  Extremities: No clubbing, cyanosis or edema. DP/PT/Radials 2+ and equal bilaterally.  Labs   Recent Labs  10/14/14 2222 10/15/14 0311 10/15/14 0902  TROPONINI <0.03 <0.03 <0.03   Lab Results  Component Value Date   WBC 5.8 10/14/2014   HGB 11.3* 10/14/2014   HCT 34.5* 10/14/2014   MCV 87.8 10/14/2014   PLT 258 10/14/2014    Recent Labs Lab 10/15/14 0311  NA 141  K 3.8  CL 108  CO2 26  BUN 12  CREATININE 1.22*  CALCIUM 8.7*  PROT 5.8*  BILITOT 0.5  ALKPHOS 83  ALT 10*  AST 17  GLUCOSE 102*   Lab Results  Component Value Date   CHOL 140 10/15/2014   HDL 67 10/15/2014   LDLCALC 59 10/15/2014   TRIG 72 10/15/2014   Lab Results  Component Value Date   DDIMER 0.39 10/14/2014    Radiology/Studies Dg Chest 2 View: 10/14/2014   CLINICAL DATA:  Known cardiac history with 3 stents, recent over exertion, chest pain and rapid heart rate today.  EXAM: CHEST  2 VIEW  COMPARISON:  Chest x-ray dated 02/14/2014.  FINDINGS: Cardiomediastinal silhouette is stable in size and configuration. The cardiomegaly is unchanged. Median sternotomy wires are intact and stable in alignment. Elevation of the right hemidiaphragm is unchanged.  Stable mild scarring noted within the left lung, unchanged. No new lung findings seen. No acute osseous abnormality.  IMPRESSION: Stable chest x-ray. No evidence of acute cardiopulmonary abnormality.   Electronically Signed   By: Franki Cabot M.D.   On: 10/14/2014 16:05   US Abdomen Complete 10/15/2014   CLINICAL DATA:  Abdominal pain  EXAM: ULTRASOUND ABDOMEN COMPLETE  COMPARISON:  02/08/2014  FINDINGS: Gallbladder: Well distended without evidence of gallbladder wall thickening. Scattered polyps are noted. No calculi are seen.  Common bile duct: Diameter: 6.3 mm.  Liver: Scattered areas of increased echogenicity are noted. These were not well appreciated on the prior CT examination. They may represent small hemangiomas. The largest of these measures 1.1 cm in greatest dimension.  IVC: No abnormality visualized.  Pancreas: Visualized portion unremarkable.  Spleen: Size and appearance within normal limits.  Right Kidney: Length: 9.8 cm.  Diffuse cortical thinning is noted.  Left Kidney: Length: 9.0 cm.  These cortical thinning is noted.  Abdominal aorta: No aneurysm visualized.  Other findings: None.  IMPRESSION: Bilateral cortical thinning in the kidneys.  Multiple gallbladder polyps.  Echogenic foci within the liver which may represent small hemangiomas.  Comparison with the recent CT shows a area of invagination of fat in the expected region of this lesion. This may simply represent some fatty tissue.   Electronically Signed   By: Inez Catalina M.D.   On: 10/15/2014 07:54    ECG: NSR. No acute changes from previous tracing  ECHOCARDIOGRAM: 05/2014 Study Conclusions - Left ventricle: The cavity size was normal. Wall thickness was normal. Systolic function was normal. The estimated ejection fraction was in the range of 60% to 65%. Wall motion was normal; there were no regional wall motion abnormalities. Doppler parameters are consistent with abnormal left ventricular relaxation (grade 1 diastolic dysfunction). - Aortic valve: There was no stenosis. - Mitral valve: Moderately calcified annulus. There was mild to moderate eccentric regurgitation. - Right ventricle: The cavity size was normal. Systolic function was normal. - Pulmonary arteries: No complete TR doppler jet so unable to estimate PA systolic pressure. - Inferior vena cava: The vessel was normal in size. The respirophasic diameter changes were in the normal range (>= 50%), consistent with normal central venous pressure. - Pericardium, extracardiac: A trivial pericardial effusion was identified posterior to the heart.  Impressions: - Normal LV size with EF 60-65%. Normal RV size and systolic function. Mild to moderate eccentric mitral regurgitation.  ASSESSMENT AND PLAN  1. Chest Pain - history of CAD s/p multiple DES with most recent DES to mid LAD in 03/2012 - patient endorses having sternal chest pain when climbing one flight of stairs, which worsened this weekend after walking throughout the day at a festival. - her chest pain is atypical in that it is reproducible with palpation  and is pleuritic in quality, being worse with deep inspirations. - cyclic troponins have been negative. EKG without acute changes. - continue ASA, Plavix, Imdur, BB, and  Statin - will arrange for outpatient NST.   2. HLD - Lipid panel showing LDL at 59. HDL at 67. Total Cholesterol at 140. - continue statin therapy.  3. HTN - BP has been 99/48 - 147/95 in the past 24 hours - continue current medical therapy.  Signed, Erma Heritage, PA-C 10/15/2014, 10:40 AM Pager: [phone] As above, patient seen and examined. Briefly she is a 69 year old female with past medical history of coronary artery disease, hypertension, hyperlipidemia, prior resection of pericardial cyst for evaluation of chest pain. Patient has had intermittent chest pain since Saturday. The pain has been almost continuous since Sunday. It is in different locations on her chest. It increases with inspiration. She also describes dyspnea on exertion. She was admitted and cardiology asked to evaluate. Electrocardiogram shows no acute ST changes. Enzymes are negative. Echocardiogram shows normal LV function and mild mitral regurgitation. Chest pain is reproduced with palpation. Symptoms are extremely atypical. Would continue preadmission medications for coronary artery disease. Patient can be discharged from a cardiac standpoint. Would arrange outpatient stress nuclear study and then follow-up with Dr. Radford Pax. We will sign off. Please call with questions. Kirk Ruths

## 2014-10-16 DIAGNOSIS — R072 Precordial pain: Secondary | ICD-10-CM

## 2014-10-16 LAB — BASIC METABOLIC PANEL
Anion gap: 11 (ref 5–15)
BUN: 7 mg/dL (ref 6–20)
CALCIUM: 8.4 mg/dL — AB (ref 8.9–10.3)
CO2: 24 mmol/L (ref 22–32)
Chloride: 105 mmol/L (ref 101–111)
Creatinine, Ser: 1.1 mg/dL — ABNORMAL HIGH (ref 0.44–1.00)
GFR calc Af Amer: 58 mL/min — ABNORMAL LOW (ref >60)
GFR, EST NON AFRICAN AMERICAN: 50 mL/min — AB (ref >60)
GLUCOSE: 93 mg/dL (ref 65–99)
Potassium: 3.5 mmol/L (ref 3.5–5.1)
Sodium: 140 mmol/L (ref 135–145)

## 2014-10-16 LAB — GLUCOSE, CAPILLARY: Glucose-Capillary: 84 mg/dL (ref 65–99)

## 2014-10-16 LAB — HEMOGLOBIN A1C
Hgb A1c MFr Bld: 6.5 % — ABNORMAL HIGH (ref 4.8–5.6)
MEAN PLASMA GLUCOSE: 140 mg/dL

## 2014-10-16 NOTE — Progress Notes (Signed)
Iv removed per order for discharge. Discharge instructions given and explained with patient teach back. Discharged via wheelchair to husband's care with volunteer services.

## 2014-10-16 NOTE — Progress Notes (Signed)
SATURATION QUALIFICATIONS: (This note is used to comply with regulatory documentation for home oxygen)  Patient Saturations on Room Air at Rest = 94%  Patient Saturations on Room Air while Ambulating = 97%  Patient Saturations on 0 Liters of oxygen while Ambulating = 97%  Please briefly explain why patient needs home oxygen: Not needed.

## 2014-10-16 NOTE — Evaluation (Signed)
Occupational Therapy Evaluation Patient Details Name: WILFRED SIVERSON MRN: [id number] DOB: 02-01-1945 Today's Date: 10/16/2014    History of Present Illness Pt admitted with CP and SOB.  PMH: CAD, HTN, HLD, pericardial cyst s/p sternotomy, depression.   Clinical Impression   Pt performing at a independent to modified independent level. All education completed.  No further OT needs.   Follow Up Recommendations  No OT follow up    Equipment Recommendations       Recommendations for Other Services       Precautions / Restrictions Restrictions Weight Bearing Restrictions: No      Mobility Bed Mobility Overal bed mobility: Modified Independent             General bed mobility comments: HOB flat, used rail  Transfers Overall transfer level: Modified independent                    Balance                                            ADL Overall ADL's : Modified independent                                     Functional mobility during ADLs: Modified independent General ADL Comments: Recommended pt consider showering and use of a shower seat. Educated in energy conservation and home safety.     Vision     Perception     Praxis      Pertinent Vitals/Pain Pain Assessment: No/denies pain     Hand Dominance Left   Extremity/Trunk Assessment Upper Extremity Assessment Upper Extremity Assessment: Overall WFL for tasks assessed   Lower Extremity Assessment Lower Extremity Assessment: Defer to PT evaluation       Communication Communication Communication: No difficulties   Cognition Arousal/Alertness: Awake/alert Behavior During Therapy: WFL for tasks assessed/performed Overall Cognitive Status: Within Functional Limits for tasks assessed                     General Comments       Exercises       Shoulder Instructions      Home Living Family/patient expects to be discharged to:: Private  residence Living Arrangements: Spouse/significant other Available Help at Discharge: Family Type of Home: House Home Access: Stairs to enter Technical brewer of Steps: 5 Entrance Stairs-Rails: Right Home Layout: One level     Bathroom Shower/Tub: Teacher, early years/pre: Standard     Home Equipment: Hand held shower head          Prior Functioning/Environment Level of Independence: Independent        Comments: pt prefers to get down in tub to bathe    OT Diagnosis:     OT Problem List:     OT Treatment/Interventions:      OT Goals(Current goals can be found in the care plan section)    OT Frequency:     Barriers to D/C:            Co-evaluation              End of Session    Activity Tolerance: Patient tolerated treatment well Patient left: in bed;with call bell/phone within reach;with family/visitor present   Time: 5027-7412 OT  Time Calculation (min): 15 min Charges:  OT General Charges $OT Visit: 1 Procedure OT Evaluation $Initial OT Evaluation Tier I: 1 Procedure G-Codes:    Malka So 10/16/2014, 3:30 PM  [phone]

## 2014-10-16 NOTE — Discharge Summary (Signed)
[facility]  Physician Discharge Summary   Patient ID: [name] MRN: [id number] DOB/AGE: 68-08-1945 68 y.o.  Admit date: 10/14/2014 Discharge date: 10/16/2014  PCP: Reginia Naas, MD  DISCHARGE DIAGNOSES:  Principal Problem:   Chest pain Active Problems:   Coronary atherosclerosis   GERD   IBS   Hypertension   Hyperlipidemia   History of drug coated stent   History of  seizures   Depression   Pericardial cyst   History of esophageal stricture   Shortness of breath   Abdominal pain   Pain in the chest   RUQ abdominal pain   RECOMMENDATIONS FOR OUTPATIENT FOLLOW UP: 1. Cardiology to arrange outpatient stress test   DISCHARGE CONDITION: fair  Diet recommendation: heart healthy  Filed Weights   10/14/14 2100 10/15/14 0352 10/16/14 0457  Weight: 65.998 kg (145 lb 8 oz) 66.044 kg (145 lb 9.6 oz) 66.407 kg (146 lb 6.4 oz)    INITIAL HISTORY: 69 year old female with a history of hypertension, depression, hyperlipidemia, esophageal stricture status post dilatation 02/15/2014, coronary artery disease status post DES most recently 04/03/2012. Presented with 1-2 day history of chest discomfort, shortness breath, and right upper quadrant abdominal pain.   Consultations:  cardiology  Procedures: Echocardiogram Study Conclusions - Left ventricle: The cavity size was normal. There was mildconcentric hypertrophy. Systolic function was normal. Theestimated ejection fraction was in the range of 55% to 60%. Wallmotion was normal; there were no regional wall motionabnormalities. Doppler parameters are consistent with abnormal left ventricular relaxation (grade 1 diastolic dysfunction). - Aortic valve: Trileaflet; mildly thickened, moderately calcifiedleaflets. Valve mobility was mildly restricted. - Mitral valve: Moderately calcified annulus. There was mildregurgitation. - Pericardium, extracardiac: A trivial pericardial effusion wasidentified  posterior to the heart. Impressions: - Compared to the prior study, there has been no significantinterval change.  HOSPITAL COURSE:   Atypical Chest pain D-dimer neg. EKG without concerning ischemic changes. Echocardiogram as above. Troponins have been negative. Patient's pain resolved. Cardiology evaluated the patient and has arranged an outpatient stress test. She does mention history of esophageal disease acquiring dilatation in the past. So her pain could be GI in origin. She is followed by Dr. Henrene Pastor and was asked to call his office if she has any further issues with difficulty swallowing.  RUQ abdominal pain Abdominal ultrasound shows gallbladder polyps without any gallbladder wall thickening, echogenic liver. HIDA scan was normal. Patient was reassured. Pain is resolved.  CAD with hx of DES most recent DES on 04/03/12. Continue aspirin and Plavix. Continue metoprolol. Pericardial cyst resection Jan 2015.  Partial Seizure Continue Keppra. Follow up Dr. Jannifer Franklin.   Essential Hypertension Continue medical Toprol succinate and diltiazem CD  GERD/History of esophageal stricture 02/15/2014 EGD showed nonobstructive Schatzki's ring. Continue PPI  Hypothyroidism Continue Synthroid  Bipolar disorder Continue home medications.  Hyperlipidemia Continue Crestor  Overall, stable. Patient's symptoms have resolved. She was reassured. Outpatient work up as discussed above. Okay for discharge home today.  PERTINENT LABS:  The results of significant diagnostics from this hospitalization (including imaging, microbiology, ancillary and laboratory) are listed below for reference.     Labs: Basic Metabolic Panel:  Recent Labs Lab 10/14/14 1547 10/15/14 0311 10/16/14 0317  NA 137 141 140  K 3.7 3.8 3.5  CL 105 108 105  CO2 22 26 24   GLUCOSE 122* 102* 93  BUN 12 12 7   CREATININE 1.25* 1.22* 1.10*  CALCIUM 8.7* 8.7* 8.4*   Liver Function Tests:  Recent Labs Lab 10/15/14 6503  AST 17  ALT 10*  ALKPHOS 83  BILITOT 0.5  PROT 5.8*  ALBUMIN 3.0*    Recent Labs Lab 10/14/14 2222  LIPASE 24   CBC:  Recent Labs Lab 10/14/14 1547  WBC 5.8  HGB 11.3*  HCT 34.5*  MCV 87.8  PLT 258   Cardiac Enzymes:  Recent Labs Lab 10/14/14 2222 10/15/14 0311 10/15/14 0902  TROPONINI <0.03 <0.03 <0.03   BNP: BNP (last 3 results)  Recent Labs  02/14/14 1203 10/15/14 0311  BNP 793.2* 193.2*    CBG:  Recent Labs Lab 10/15/14 0731 10/15/14 0902 10/15/14 1441 10/16/14 0600  GLUCAP 97 91 95 84     IMAGING STUDIES Dg Chest 2 View  10/14/2014   CLINICAL DATA:  Known cardiac history with 3 stents, recent over exertion, chest pain and rapid heart rate today.  EXAM: CHEST  2 VIEW  COMPARISON:  Chest x-ray dated 02/14/2014.  FINDINGS: Cardiomediastinal silhouette is stable in size and configuration. The cardiomegaly is unchanged. Median sternotomy wires are intact and stable in alignment. Elevation of the right hemidiaphragm is unchanged.  Stable mild scarring noted within the left lung, unchanged. No new lung findings seen. No acute osseous abnormality.  IMPRESSION: Stable chest x-ray. No evidence of acute cardiopulmonary abnormality.   Electronically Signed   By: Franki Cabot M.D.   On: 10/14/2014 16:05   US Abdomen Complete  10/15/2014   CLINICAL DATA:  Abdominal pain  EXAM: ULTRASOUND ABDOMEN COMPLETE  COMPARISON:  02/08/2014  FINDINGS: Gallbladder: Well distended without evidence of gallbladder wall thickening. Scattered polyps are noted. No calculi are seen.  Common bile duct: Diameter: 6.3 mm.  Liver: Scattered areas of increased echogenicity are noted. These were not well appreciated on the prior CT examination. They may represent small hemangiomas. The largest of these measures 1.1 cm in greatest dimension.  IVC: No abnormality visualized.  Pancreas: Visualized portion unremarkable.  Spleen: Size and appearance within normal limits.  Right Kidney: Length:  9.8 cm.  Diffuse cortical thinning is noted.  Left Kidney: Length: 9.0 cm.  These cortical thinning is noted.  Abdominal aorta: No aneurysm visualized.  Other findings: None.  IMPRESSION: Bilateral cortical thinning in the kidneys.  Multiple gallbladder polyps.  Echogenic foci within the liver which may represent small hemangiomas. Comparison with the recent CT shows a area of invagination of fat in the expected region of this lesion. This may simply represent some fatty tissue.   Electronically Signed   By: Inez Catalina M.D.   On: 10/15/2014 07:54   Nm Hepato W/eject Fract  10/15/2014   CLINICAL DATA:  Abdominal pain. History of gastroesophageal reflux disease. History of esophageal stricture and status post esophageal dilation. Patient also with chest pain and shortness of breath.  EXAM: NUCLEAR MEDICINE HEPATOBILIARY IMAGING WITH GALLBLADDER EF  TECHNIQUE: Sequential images of the abdomen were obtained out to 60 minutes following intravenous administration of radiopharmaceutical. After slow intravenous infusion of 1.32 micrograms Cholecystokinin, gallbladder ejection fraction was determined.  RADIOPHARMACEUTICALS:  5.0 mCi Tc-4m Choletec IV  COMPARISON:  Ultrasound, 10/15/2014  FINDINGS: There is homogeneous accumulation of radiotracer by the liver. Prompt excretion is noted into the intra and extrahepatic biliary tree with early small bowel activity noted. Gallbladder activity was noted at 35 minutes following the injection of radiotracer. Gallbladder function was then assessed with gallbladder contraction stimulated by a CCK infusion. Gallbladder contraction is normal with a calculated ejection fraction of 77%. At 45 min, normal ejection fraction is greater than 40%.  IMPRESSION: Normal hepatobiliary imaging with normal gallbladder function.   Electronically Signed   By: Lajean Manes M.D.   On: 10/15/2014 16:38    DISCHARGE EXAMINATION: Filed Vitals:   10/16/14 0457 10/16/14 1103 10/16/14 1152  10/16/14 1246  BP: 143/69 135/70  132/69  Pulse: 67 66  73  Temp: 98 F (36.7 C)   98.9 F (37.2 C)  TempSrc: Oral   Oral  Resp: 18   20  Height:      Weight: 66.407 kg (146 lb 6.4 oz)     SpO2: 99%  91% 99%   General appearance: alert, cooperative, appears stated age and no distress Resp: clear to auscultation bilaterally Cardio: regular rate and rhythm, S1, S2 normal, no murmur, click, rub or gallop GI: soft, non-tender; bowel sounds normal; no masses,  no organomegaly Extremities: extremities normal, atraumatic, no cyanosis or edema   DISPOSITION: home with husband  Discharge Instructions    Call MD for:  difficulty breathing, headache or visual disturbances    Complete by:  As directed      Call MD for:  extreme fatigue    Complete by:  As directed      Call MD for:  persistant dizziness or light-headedness    Complete by:  As directed      Call MD for:  persistant nausea and vomiting    Complete by:  As directed      Call MD for:  severe uncontrolled pain    Complete by:  As directed      Call MD for:  temperature >100.4    Complete by:  As directed      Diet - low sodium heart healthy    Complete by:  As directed      Discharge instructions    Complete by:  As directed   Please follow-up with your gastroenterologist if you have issues with difficulty swallowing. Follow-up with the primary care provider in one week.  You were cared for by a hospitalist during your hospital stay. If you have any questions about your discharge medications or the care you received while you were in the hospital after you are discharged, you can call the unit and asked to speak with the hospitalist on call if the hospitalist that took care of you is not available. Once you are discharged, your primary care physician will handle any further medical issues. Please note that NO REFILLS for any discharge medications will be authorized once you are discharged, as it is imperative that you return to  your primary care physician (or establish a relationship with a primary care physician if you do not have one) for your aftercare needs so that they can reassess your need for medications and monitor your lab values. If you do not have a primary care physician, you can call [phone] for a physician referral.     Increase activity slowly    Complete by:  As directed            ALLERGIES: No Known Allergies   Current Discharge Medication List    CONTINUE these medications which have NOT CHANGED   Details  ALPRAZolam (XANAX) 0.25 MG tablet Take 0.25 mg by mouth at bedtime.     aspirin EC 81 MG tablet Take 81 mg by mouth daily.    calcitRIOL (ROCALTROL) 0.25 MCG capsule Take 0.25 mcg by mouth daily.    clopidogrel (PLAVIX) 75 MG tablet Take 1 tablet (75 mg total) by mouth daily  with breakfast. Qty: 30 tablet, Refills: 10    diltiazem (MATZIM LA) 240 MG 24 hr tablet Take 240 mg by mouth every morning.     doxazosin (CARDURA) 2 MG tablet Take 1 tablet (2 mg total) by mouth daily. Qty: 30 tablet, Refills: 5    HYDROcodone-acetaminophen (NORCO/VICODIN) 5-325 MG per tablet Take 1-2 tablets by mouth every 4 (four) hours as needed for moderate pain.    isosorbide mononitrate (IMDUR) 60 MG 24 hr tablet Take 1.5 tablets (90 mg total) by mouth daily. Qty: 45 tablet, Refills: 3    levETIRAcetam (KEPPRA) 500 MG tablet Take 1 tablet (500 mg total) by mouth 2 (two) times daily. Qty: 60 tablet, Refills: 11    levothyroxine (SYNTHROID, LEVOTHROID) 112 MCG tablet Take 112 mcg by mouth daily. Refills: 0    losartan (COZAAR) 100 MG tablet Take 100 mg by mouth every morning.     metoprolol succinate (TOPROL-XL) 50 MG 24 hr tablet TAKE 1 TABLET (50 MG TOTAL) BY MOUTH DAILY. TAKE WITH OR IMMEDIATELY FOLLOWING A MEAL. Qty: 30 tablet, Refills: 6    nitroGLYCERIN (NITROSTAT) 0.4 MG SL tablet Place 1 tablet (0.4 mg total) under the tongue every 5 (five) minutes as needed for chest pain. Qty: 25  tablet, Refills: 5    pantoprazole (PROTONIX) 40 MG tablet TAKE 1 TABLET (40 MG TOTAL) BY MOUTH 2 (TWO) TIMES DAILY. Qty: 60 tablet, Refills: 6    PARoxetine (PAXIL) 30 MG tablet Take 30 mg by mouth daily.    potassium chloride SA (K-DUR,KLOR-CON) 20 MEQ tablet Take 20 mEq by mouth 2 (two) times daily.    rosuvastatin (CRESTOR) 20 MG tablet Take 20 mg by mouth every morning.        Follow-up Information    Follow up with [facility] On 10/29/2014.   Why:  Cardiology Nuclear Stress Test on 10/29/2014 at 10:00AM. Nothing to eat or drink after midnight leading up to the test except sips of water with medication.    Contact information:   [address] [place] [address]       Follow up with Sueanne Margarita, MD On 11/20/2014.   Specialty:  Cardiology   Why:  Cardiology appointment with Dr. Radford Pax on 11/20/2014 at 10:45AM.   Contact information:   [address] [place] [address] [phone]       Follow up with Reginia Naas, MD. Schedule an appointment as soon as possible for a visit in 1 week.   Specialty:  Family Medicine   Why:  post hospitalization follow up   Contact information:   [place] [address] [phone]       TOTAL DISCHARGE TIME: 35 minutes  [facility] Pager [phone]  10/16/2014, 3:54 PM

## 2014-10-16 NOTE — Evaluation (Signed)
Physical Therapy Evaluation Patient Details Name: LINDSEY DEMONTE MRN: [id number] DOB: 07/29/1945 Today's Date: 10/16/2014   History of Present Illness  Pt admitted with CP and SOB.  PMH: CAD, HTN, HLD, pericardial cyst s/p sternotomy, depression.  Clinical Impression  Patient evaluated by Physical Therapy with no further acute PT needs identified. All education has been completed and the patient has no further questions. PT is signing off. Thank you for this referral.      Follow Up Recommendations No PT follow up    Equipment Recommendations  None recommended by PT    Recommendations for Other Services       Precautions / Restrictions Restrictions Weight Bearing Restrictions: No      Mobility  Bed Mobility Overal bed mobility: Modified Independent             General bed mobility comments: HOB flat, used rail  Transfers Overall transfer level: Modified independent                  Ambulation/Gait Ambulation/Gait assistance: Independent Ambulation Distance (Feet): 200 Feet Assistive device: None Gait Pattern/deviations: WFL(Within Functional Limits) Gait velocity: slowed   General Gait Details: Noted good self monitor for activity tolerance  Stairs            Wheelchair Mobility    Modified Rankin (Stroke Patients Only)       Balance Overall balance assessment: No apparent balance deficits (not formally assessed)                                           Pertinent Vitals/Pain Pain Assessment: No/denies pain    Home Living Family/patient expects to be discharged to:: Private residence Living Arrangements: Spouse/significant other Available Help at Discharge: Family Type of Home: House Home Access: Stairs to enter Entrance Stairs-Rails: Right Entrance Stairs-Number of Steps: 5 Home Layout: One level Home Equipment: Hand held shower head      Prior Function Level of Independence: Independent          Comments: pt prefers to get down in tub to bathe     Hand Dominance   Dominant Hand: Left    Extremity/Trunk Assessment   Upper Extremity Assessment: Overall WFL for tasks assessed           Lower Extremity Assessment: Overall WFL for tasks assessed         Communication   Communication: No difficulties  Cognition Arousal/Alertness: Awake/alert Behavior During Therapy: WFL for tasks assessed/performed Overall Cognitive Status: Within Functional Limits for tasks assessed                      General Comments General comments (skin integrity, edema, etc.): O2 sats remained greater than or equal to 94% during amb on Room Air    Exercises        Assessment/Plan    PT Assessment Patent does not need any further PT services  PT Diagnosis Difficulty walking   PT Problem List    PT Treatment Interventions     PT Goals (Current goals can be found in the Care Plan section) Acute Rehab PT Goals Patient Stated Goal: wants to get better PT Goal Formulation: All assessment and education complete, DC therapy    Frequency     Barriers to discharge        Co-evaluation  End of Session   Activity Tolerance: Patient tolerated treatment well Patient left: in bed;with call bell/phone within reach;Other (comment) (sitting EOB) Nurse Communication: Mobility status         Time: 1207-1221 PT Time Calculation (min) (ACUTE ONLY): 14 min   Charges:   PT Evaluation $Initial PT Evaluation Tier I: 1 Procedure     PT G CodesRoney Marion Hamff 10/16/2014, 4:08 PM  Roney Marion, [place]  Acute Rehabilitation Services Pager [phone] Office [phone]

## 2014-10-17 LAB — LEVETIRACETAM LEVEL: Levetiracetam Lvl: 33.7 ug/mL (ref 10.0–40.0)

## 2014-10-24 ENCOUNTER — Telehealth (HOSPITAL_COMMUNITY): Payer: Self-pay

## 2014-10-24 NOTE — Telephone Encounter (Signed)
Patient given detailed instructions per Myocardial Perfusion Study Information Sheet for test on 10-29-2014 at 1000. Patient notified to arrive 15 minutes early and that it is imperative to arrive on time for appointment to keep from having the test rescheduled.  If you need to cancel or reschedule your appointment, please call the office within 24 hours of your appointment. Failure to do so may result in a cancellation of your appointment, and a $50 no show fee. Patient verbalized understanding. [name], [name]

## 2014-10-29 ENCOUNTER — Ambulatory Visit (HOSPITAL_COMMUNITY): Payer: PPO | Attending: Internal Medicine

## 2014-10-29 DIAGNOSIS — R0602 Shortness of breath: Secondary | ICD-10-CM | POA: Diagnosis not present

## 2014-10-29 DIAGNOSIS — R0609 Other forms of dyspnea: Secondary | ICD-10-CM | POA: Insufficient documentation

## 2014-10-29 DIAGNOSIS — I200 Unstable angina: Secondary | ICD-10-CM | POA: Diagnosis not present

## 2014-10-29 DIAGNOSIS — I10 Essential (primary) hypertension: Secondary | ICD-10-CM | POA: Diagnosis not present

## 2014-10-29 DIAGNOSIS — R002 Palpitations: Secondary | ICD-10-CM | POA: Insufficient documentation

## 2014-10-29 LAB — MYOCARDIAL PERFUSION IMAGING
CHL CUP NUCLEAR SRS: 6
CHL CUP NUCLEAR SSS: 11
LV dias vol: 52 mL
LV sys vol: 7 mL
NUC STRESS TID: 1.1
Peak HR: 91 {beats}/min
RATE: 0.3
Rest HR: 53 {beats}/min
SDS: 5

## 2014-10-29 MED ORDER — TECHNETIUM TC 99M SESTAMIBI GENERIC - CARDIOLITE
31.2000 | Freq: Once | INTRAVENOUS | Status: AC | PRN
  Administered 2014-10-29: 31.2 via INTRAVENOUS

## 2014-10-29 MED ORDER — REGADENOSON 0.4 MG/5ML IV SOLN
0.4000 mg | Freq: Once | INTRAVENOUS | Status: AC
  Administered 2014-10-29: 0.4 mg via INTRAVENOUS

## 2014-10-29 MED ORDER — TECHNETIUM TC 99M SESTAMIBI GENERIC - CARDIOLITE
10.9000 | Freq: Once | INTRAVENOUS | Status: AC | PRN
  Administered 2014-10-29: 10.9 via INTRAVENOUS

## 2014-10-30 ENCOUNTER — Ambulatory Visit (INDEPENDENT_AMBULATORY_CARE_PROVIDER_SITE_OTHER): Payer: PPO | Admitting: Internal Medicine

## 2014-10-30 ENCOUNTER — Encounter: Payer: Self-pay | Admitting: Internal Medicine

## 2014-10-30 VITALS — BP 126/72 | HR 84 | Ht 59.0 in | Wt 147.0 lb

## 2014-10-30 DIAGNOSIS — R1310 Dysphagia, unspecified: Secondary | ICD-10-CM | POA: Diagnosis not present

## 2014-10-30 DIAGNOSIS — K219 Gastro-esophageal reflux disease without esophagitis: Secondary | ICD-10-CM | POA: Diagnosis not present

## 2014-10-30 DIAGNOSIS — R0789 Other chest pain: Secondary | ICD-10-CM | POA: Diagnosis not present

## 2014-10-30 MED ORDER — PANTOPRAZOLE SODIUM 40 MG PO TBEC: DELAYED_RELEASE_TABLET | ORAL | Status: DC

## 2014-10-30 NOTE — Patient Instructions (Signed)
We have sent the following medications to your pharmacy for you to pick up at your convenience:  Pantoprazole  

## 2014-10-30 NOTE — Progress Notes (Signed)
HISTORY OF PRESENT ILLNESS:  [name] is a 69 y.o. female with MULTIPLE SIGNIFICANT medical problems as listed below. She presents today with chief complaint of chest pain and management of chronic GERD. Multiple hospital records, x-rays, and prior endoscopy reports reviewed as well as laboratories. The patient has a history of chronic GERD, chronic dysphagia secondary to a combination of stricture and dysmotility, and fluctuating bowel habits. She is accompanied today by her husband. Patient was last seen in the office November 2015. See that dictation. She is status post resection of an anterior pericardial cyst. She was hospitalized October 3 through 10/16/2014 for this same chest pain. Negative inpatient workup including abdominal ultrasound and HIDA scan. Outpatient cardiology stress test performed yesterday was normal. She has had the pain for many months and describes it as substernal and exacerbated by deep breathing. For her chronic GERD she takes pantoprazole 40 mg twice daily. No significant active reflux symptoms except for occasional regurgitation. She continues with low level chronic dysphagia. She did undergo upper endoscopy in the hospital February 2016. This revealed mild esophagitis and nonobstructing Schatzki's ring. No dilation. Treated with Carafate. Other GI complaints include occasional nausea, weight gain, and bloating. Her bowel habits have been regular  REVIEW OF SYSTEMS:  All non-GI ROS negative except for  Past Medical History  Diagnosis Date  . Irritable bowel syndrome   . Hypertension   . Depression   . Hypothyroidism   . Internal hemorrhoids   . Anxiety   . Heart murmur   . Hyperlipidemia   . Vitamin D deficiency   . Chronic low back pain   . Carpal tunnel syndrome   . History of non-ST elevation myocardial infarction (NSTEMI)     2011--  S/P PCI WITH SENTING  . S/P drug eluting coronary stent placement     X3  in 2011, 2012, 2014  . GERD  (gastroesophageal reflux disease)   . History of hiatal hernia   . Partial seizure disorder (HCC) NEUROLOGIST-- DR WILLIS    NOCTURNAL  . History of supraventricular tachycardia     2011-- resolved  . History of esophageal dilatation     FOR STRIUCTURE  . Gross hematuria   . Renal calculus, right   . Arthritis   . Cervical spondylosis without myelopathy   . S/P pericardial cyst excision     02-05-2013  benign  . Coronary artery disease CARDIOLOGIST-  DR Irish Lack    hx NSTEMI inferoposterior 2011 s/p PCI  total x3 DES's  . PONV (postoperative nausea and vomiting)   . History of gout     Past Surgical History  Procedure Laterality Date  . Mediasternotomy N/A 02/05/2013    Procedure: MEDIAN STERNOTOMY;  Surgeon: Ivin Poot, MD;  Location: [facility] OR;  Service: Thoracic;  Laterality: N/A;  . Biopsy of mediastinal mass N/A 02/05/2013    Procedure: RESECTION OF MEDIASTINAL MASS;  Surgeon: Ivin Poot, MD;  Location: [facility] OR;  Service: Thoracic;  Laterality: N/A;  . Ectopic pregnancy surgery  YRS AGO    SALPINGECTOMY  . Coronary angiogram  03/10/2011    Procedure: CORONARY ANGIOGRAM;  Surgeon: Jettie Booze, MD;  Location: [facility] CATH LAB;  Service: Cardiovascular;;  . Left heart catheterization with coronary angiogram N/A 03/14/2012    Procedure: LEFT HEART CATHETERIZATION WITH CORONARY ANGIOGRAM;  Surgeon: Sueanne Margarita, MD;  Location: [facility] CATH LAB;  Service: Cardiovascular;  Laterality: N/A;  Normal LM,  50% pLAD,  70% mLAD,  D2  50-70%, very tortuous LAD,  70-82% ostial LCFX,  50% in-stent restenosis of  dRCA and mRCA  stent and 50-70% ostial PDA,  normal LVSF, ef 60%  . Percutaneous coronary stent intervention (pci-s) N/A 04/03/2012    Procedure: PERCUTANEOUS CORONARY STENT INTERVENTION (PCI-S);  Surgeon: Jettie Booze, MD;  Location: [facility] CATH LAB;  Service: Cardiovascular;  Laterality: N/A;   Successful PCI  mLAD with 2.75x12 Promus stent, postdilated to >0.44mm  . Coronary  angioplasty with stent placement  11-22-2009  dr Irish Lack    Acute inferoposterior MI/  ef 60%,  PCI with DES x1 to dRCA,  25%  mLAD  . Coronary angioplasty with stent placement  06-26-2010  dr Daneen Schick    Cutting balloon angioplasty to dRCA with DES x1,  40% mLAD (non-obstructive CAD)  . Transthoracic echocardiogram  11-17-2011    grade I diastolic dysfunction/  ef 55-60%  . Cardiovascular stress test  11-22-2013  dr Irish Lack    normal lexiscan study/  no ischemia/  normal LVF and wall motion/ ef 81%  . Esophagogastroduodenoscopy (egd) with esophageal dilation  05-20-2011  . Colonoscopy  last one 2014  . Needle guided excision breast calcifications Right 08-09-2008  . Cystoscopy with retrograde pyelogram, ureteroscopy and stent placement Right 02/13/2014    Procedure: CYSTOSCOPY WITH RETROGRADE PYELOGRAM, URETEROSCOPY AND STENT PLACEMENT;  Surgeon: Bernestine Amass, MD;  Location: [facility];  Service: Urology;  Laterality: Right;  . Holmium laser application Right 03/16/7015    Procedure: HOLMIUM LASER APPLICATION;  Surgeon: Bernestine Amass, MD;  Location: [facility];  Service: Urology;  Laterality: Right;  . Esophagogastroduodenoscopy N/A 02/15/2014    Procedure: ESOPHAGOGASTRODUODENOSCOPY (EGD);  Surgeon: Jerene Bears, MD;  Location: [facility];  Service: Endoscopy;  Laterality: N/A;    Social History Shemiah REXANN LUERAS  reports that she quit smoking about 47 years ago. Her smoking use included Cigarettes. She started smoking about 50 years ago. She has a .9 pack-year smoking history. She has never used smokeless tobacco. She reports that she does not drink alcohol or use illicit drugs.  family history includes Breast cancer in her sister and another family member; CVA in her mother; Cancer in her sister; Coronary artery disease in her father; Emphysema in her father; Heart attack in her mother; Heart disease in her father; Hypertension in her father. There is no  history of Colon cancer.  No Known Allergies     PHYSICAL EXAMINATION: Vital signs: BP 126/72 mmHg  Pulse 84  Ht 4\' 11"  (1.499 m)  Wt 147 lb (66.679 kg)  BMI 29.67 kg/m2  Constitutional: Chronically ill-appearing, overweight, no acute distress Psychiatric: alert and oriented x3, cooperative Eyes: extraocular movements intact, anicteric, conjunctiva pink Mouth: oral pharynx moist, no lesions Neck: supple no lymphadenopathy Chest: Severe chest pain with palpation over the sternum. This reproduces her pain. Deep breathing induces and exacerbates that same pain Cardiovascular: heart regular rate and rhythm, no murmur Lungs: clear to auscultation bilaterally Abdomen: soft, obese, nontender, nondistended, no obvious ascites, no peritoneal signs, normal bowel sounds, no organomegaly Rectal: Omitted Extremities: no clubbing cyanosis or lower extremity edema bilaterally Skin: no lesions on visible extremities Neuro: No focal deficits.   ASSESSMENT:  #1. Chest pain. Post sternotomy syndrome #2. Chronic GERD. For the most part, controlled with twice daily PPI #3. Chronic dysphagia. Mostly dysmotility though has a history of nonobstructing distal ring #4. Multiple significant medical problems  PLAN:  #1. Discussed her chest pain. Recommended  she try extra strength Tylenol and recommended dosages. If pain resistant to that, she needs to see her PCP for further management #2. Reflux precautions #3. Continue PPI. Refilled #4. Consider repeat colonoscopy in 2021 based on overall health #5. Routine GI office follow-up in 1 year. Sooner if needed #6. Resume general medical care with primary providers    30 minutes was spent with the patient (and her husband) face-to-face with greater than 50% of the time used for reviewing her diagnoses as listed above, there workup, treatment, and expectations.

## 2014-11-01 ENCOUNTER — Telehealth: Payer: Self-pay | Admitting: Cardiology

## 2014-11-01 NOTE — Telephone Encounter (Signed)
New message  Pt husband called for Myocardial perfusion results. Please call

## 2014-11-01 NOTE — Telephone Encounter (Signed)
Notes Recorded by Erma Heritage, PA on 10/29/2014 at 2:47 PM Please let the patient know she had a normal nuclear stress test with no evidence of ischemia. Has office visit with Dr. Radford Pax on 11/20/2014.  Called patient about stress test results. Patient verbalized understanding.

## 2014-11-19 ENCOUNTER — Other Ambulatory Visit: Payer: Self-pay | Admitting: *Deleted

## 2014-11-19 NOTE — Progress Notes (Signed)
Cardiology Office Note   Date:  11/20/2014   ID:  [name], DOB October 31, 1945, MRN [id number]  PCP:  Jasmine Naas, MD    Chief Complaint  Patient presents with  . Coronary Artery Disease  . Hypertension      History of Present Illness: [name] is a 69 y.o. female with a history of CAD/HTN, pericardial cyst s/p resection and dyslipididemia who presents today for followup. She was recently in the ER due to chest pain and workup was normal and had a normal nuclear stress test. She has a history of chronic CP with esophageal strictures and has had esophageal dilatation. She says that she has been having some problems with SOB when walking but went to [place] last week and walked a lot.   She is deconditioned and has not exercised for 3 months.  She has also noticed some wheezing at night.  She denies any LE edema.  She drove down to [place] over 2 days.  Her SOB has been going on for some time and not any worse after her trip.    Past Medical History  Diagnosis Date  . Irritable bowel syndrome   . Hypertension   . Depression   . Hypothyroidism   . Internal hemorrhoids   . Anxiety   . Heart murmur   . Hyperlipidemia   . Vitamin D deficiency   . Chronic low back pain   . Carpal tunnel syndrome   . History of non-ST elevation myocardial infarction (NSTEMI)     2011--  S/P PCI WITH SENTING  . S/P drug eluting coronary stent placement     X3  in 2011, 2012, 2014  . GERD (gastroesophageal reflux disease)   . History of hiatal hernia   . Partial seizure disorder (HCC) NEUROLOGIST-- DR WILLIS    NOCTURNAL  . History of supraventricular tachycardia     2011-- resolved  . History of esophageal dilatation     FOR STRIUCTURE  . Gross hematuria   . Renal calculus, right   . Arthritis   . Cervical spondylosis without myelopathy   . S/P pericardial cyst excision     02-05-2013  benign  . Coronary artery disease CARDIOLOGIST-  DR Irish Lack    hx  NSTEMI inferoposterior 2011 s/p PCI  total x3 DES's  . PONV (postoperative nausea and vomiting)   . History of gout     Past Surgical History  Procedure Laterality Date  . Mediasternotomy N/A 02/05/2013    Procedure: MEDIAN STERNOTOMY;  Surgeon: Ivin Poot, MD;  Location: [facility] OR;  Service: Thoracic;  Laterality: N/A;  . Biopsy of mediastinal mass N/A 02/05/2013    Procedure: RESECTION OF MEDIASTINAL MASS;  Surgeon: Ivin Poot, MD;  Location: [facility] OR;  Service: Thoracic;  Laterality: N/A;  . Ectopic pregnancy surgery  YRS AGO    SALPINGECTOMY  . Coronary angiogram  03/10/2011    Procedure: CORONARY ANGIOGRAM;  Surgeon: Jettie Booze, MD;  Location: [facility] CATH LAB;  Service: Cardiovascular;;  . Left heart catheterization with coronary angiogram N/A 03/14/2012    Procedure: LEFT HEART CATHETERIZATION WITH CORONARY ANGIOGRAM;  Surgeon: Sueanne Margarita, MD;  Location: [facility] CATH LAB;  Service: Cardiovascular;  Laterality: N/A;  Normal LM,  50% pLAD,  70% mLAD,  D2 50-70%, very tortuous LAD,  70-82% ostial LCFX,  50% in-stent restenosis of  dRCA and mRCA  stent and 50-70% ostial PDA,  normal LVSF, ef 60%  . Percutaneous coronary stent intervention (pci-s) N/A 04/03/2012    Procedure: PERCUTANEOUS CORONARY STENT INTERVENTION (PCI-S);  Surgeon: Jettie Booze, MD;  Location: [facility] CATH LAB;  Service: Cardiovascular;  Laterality: N/A;   Successful PCI  mLAD with 2.75x12 Promus stent, postdilated to >0.30mm  . Coronary angioplasty with stent placement  11-22-2009  dr Irish Lack    Acute inferoposterior MI/  ef 60%,  PCI with DES x1 to dRCA,  25%  mLAD  . Coronary angioplasty with stent placement  06-26-2010  dr Daneen Schick    Cutting balloon angioplasty to dRCA with DES x1,  40% mLAD (non-obstructive CAD)  . Transthoracic echocardiogram  11-17-2011    grade I diastolic dysfunction/  ef 55-60%  . Cardiovascular stress test  11-22-2013  dr Irish Lack    normal lexiscan study/  no ischemia/  normal LVF and  wall motion/ ef 81%  . Esophagogastroduodenoscopy (egd) with esophageal dilation  05-20-2011  . Colonoscopy  last one 2014  . Needle guided excision breast calcifications Right 08-09-2008  . Cystoscopy with retrograde pyelogram, ureteroscopy and stent placement Right 02/13/2014    Procedure: CYSTOSCOPY WITH RETROGRADE PYELOGRAM, URETEROSCOPY AND STENT PLACEMENT;  Surgeon: Bernestine Amass, MD;  Location: [facility];  Service: Urology;  Laterality: Right;  . Holmium laser application Right 01/13/7515    Procedure: HOLMIUM LASER APPLICATION;  Surgeon: Bernestine Amass, MD;  Location: [facility];  Service: Urology;  Laterality: Right;  . Esophagogastroduodenoscopy N/A 02/15/2014    Procedure: ESOPHAGOGASTRODUODENOSCOPY (EGD);  Surgeon: Jerene Bears, MD;  Location: [facility];  Service: Endoscopy;  Laterality: N/A;     Current Outpatient Prescriptions  Medication Sig Dispense Refill  . ALPRAZolam (XANAX) 0.5 MG tablet Take 0.5 mg by mouth at bedtime.  2  . aspirin EC 81 MG tablet Take 81 mg by mouth daily.    . calcitRIOL (ROCALTROL) 0.25 MCG capsule Take 0.25 mcg by mouth daily.    . clopidogrel (PLAVIX) 75 MG tablet Take 1 tablet (75 mg total) by mouth daily with breakfast. 30 tablet 10  . diltiazem (MATZIM LA) 240 MG 24 hr tablet Take 240 mg by mouth every morning.     Marland Kitchen doxazosin (CARDURA) 2 MG tablet Take 1 tablet (2 mg total) by mouth daily. 30 tablet 5  . HYDROcodone-acetaminophen (NORCO/VICODIN) 5-325 MG per tablet Take 1-2 tablets by mouth every 4 (four) hours as needed for moderate pain.    . isosorbide mononitrate (IMDUR) 60 MG 24 hr tablet Take 1.5 tablets (90 mg total) by mouth daily. 45 tablet 3  . levETIRAcetam (KEPPRA) 500 MG tablet Take 1 tablet (500 mg total) by mouth 2 (two) times daily. 60 tablet 11  . levothyroxine (SYNTHROID, LEVOTHROID) 112 MCG tablet Take 112 mcg by mouth daily.  0  . losartan (COZAAR) 100 MG tablet Take 100 mg by mouth every  morning.     . metoprolol succinate (TOPROL-XL) 50 MG 24 hr tablet Take 50 mg by mouth daily. Take with or immediately following a meal.    . nitroGLYCERIN (NITROSTAT) 0.4 MG SL tablet Place 1 tablet (0.4 mg total) under the tongue every 5 (five) minutes as needed for chest pain. 25 tablet 5  . pantoprazole (PROTONIX) 40 MG tablet TAKE 1 TABLET (40 MG TOTAL) BY MOUTH 2 (TWO) TIMES DAILY. 60 tablet 11  . PARoxetine (PAXIL) 30 MG tablet Take 30 mg by mouth daily.    Marland Kitchen  potassium chloride SA (K-DUR,KLOR-CON) 20 MEQ tablet Take 20 mEq by mouth 2 (two) times daily.    . rosuvastatin (CRESTOR) 20 MG tablet Take 20 mg by mouth every morning.      No current facility-administered medications for this visit.    Allergies:   Review of patient's allergies indicates no known allergies.    Social History:  The patient  reports that she quit smoking about 47 years ago. Her smoking use included Cigarettes. She started smoking about 50 years ago. She has a .9 pack-year smoking history. She has never used smokeless tobacco. She reports that she does not drink alcohol or use illicit drugs.   Family History:  The patient's family history includes Breast cancer in her sister and another family member; CVA in her mother; Cancer in her sister; Coronary artery disease in her father; Emphysema in her father; Heart attack in her mother; Heart disease in her father; Hypertension in her father. There is no history of Colon cancer.    ROS:  Please see the history of present illness.   Otherwise, review of systems are positive for none.   All other systems are reviewed and negative.    PHYSICAL EXAM: VS:  BP 140/78 mmHg  Pulse 68  Ht 4\' 11"  (1.499 m)  Wt 149 lb 9.6 oz (67.858 kg)  BMI 30.20 kg/m2  SpO2 99% , BMI Body mass index is 30.2 kg/(m^2). GEN: Well nourished, well developed, in no acute distress HEENT: normal Neck: no JVD, carotid bruits, or masses Cardiac: RRR; no murmurs, rubs, or gallops,no edema    Respiratory:  clear to auscultation bilaterally, normal work of breathing GI: soft, nontender, nondistended, + BS MS: no deformity or atrophy Skin: warm and dry, no rash Neuro:  Strength and sensation are intact Psych: euthymic mood, full affect   EKG:  EKG is not ordered today.    Recent Labs: 10/14/2014: Hemoglobin 11.3*; Platelets 258 10/15/2014: ALT 10*; B Natriuretic Peptide 193.2*; TSH 0.221* 10/16/2014: BUN 7; Creatinine, Ser 1.10*; Potassium 3.5; Sodium 140    Lipid Panel    Component Value Date/Time   CHOL 140 10/15/2014 0311   TRIG 72 10/15/2014 0311   HDL 67 10/15/2014 0311   CHOLHDL 2.1 10/15/2014 0311   VLDL 14 10/15/2014 0311   LDLCALC 59 10/15/2014 0311      Wt Readings from Last 3 Encounters:  11/20/14 149 lb 9.6 oz (67.858 kg)  10/30/14 147 lb (66.679 kg)  10/29/14 146 lb (66.225 kg)    ASSESSMENT AND PLAN:  1. ASCAD - with no typical angina. Nuclear stress test 11/2013 with no ischemia - continue ASA/Plavix/Imdur/metoprolol  2. HTN - well controlled - continue metoprolol/Losartan/doxazosin/diltiazem  3. Dyslipidemia - LDL at goal  - continue crestor  4. Chronic noncardiac CP secondary to pericardial cyst and post sternotomy syndrome.  She is tender over her sternum which reproduces the pain.  I will check a Chest CT to rule out PE and pericardial cyst 5. GERD with esophageal stricture - per GI 6.  SOB most likely due to deconditioning and obesity.  I will check a BNP and D-Dimer.       Current medicines are reviewed at length with the patient today.  The patient does not have concerns regarding medicines.  The following changes have been made:  no change  Labs/ tests ordered today: See above Assessment and Plan No orders of the defined types were placed in this encounter.     Disposition:   FU  with me in 1 year  Signed, Sueanne Margarita, MD  11/20/2014 11:13 AM    [facility] [address], [place], [place]   [address] Phone: [phone]; Fax: [phone]

## 2014-11-20 ENCOUNTER — Ambulatory Visit (INDEPENDENT_AMBULATORY_CARE_PROVIDER_SITE_OTHER)
Admission: RE | Admit: 2014-11-20 | Discharge: 2014-11-20 | Disposition: A | Discharge status: Home / Self Care | Payer: PPO | Source: Ambulatory Visit | Attending: Cardiology | Admitting: Cardiology

## 2014-11-20 ENCOUNTER — Telehealth: Payer: Self-pay

## 2014-11-20 ENCOUNTER — Ambulatory Visit (INDEPENDENT_AMBULATORY_CARE_PROVIDER_SITE_OTHER): Payer: PPO | Admitting: Cardiology

## 2014-11-20 ENCOUNTER — Encounter: Payer: Self-pay | Admitting: Cardiology

## 2014-11-20 VITALS — BP 140/78 | HR 68 | Ht 59.0 in | Wt 149.6 lb

## 2014-11-20 DIAGNOSIS — Q249 Congenital malformation of heart, unspecified: Secondary | ICD-10-CM

## 2014-11-20 DIAGNOSIS — E785 Hyperlipidemia, unspecified: Secondary | ICD-10-CM | POA: Diagnosis not present

## 2014-11-20 DIAGNOSIS — R079 Chest pain, unspecified: Secondary | ICD-10-CM

## 2014-11-20 DIAGNOSIS — I10 Essential (primary) hypertension: Secondary | ICD-10-CM | POA: Diagnosis not present

## 2014-11-20 DIAGNOSIS — IMO0001 Reserved for inherently not codable concepts without codable children: Secondary | ICD-10-CM

## 2014-11-20 DIAGNOSIS — I2510 Atherosclerotic heart disease of native coronary artery without angina pectoris: Secondary | ICD-10-CM | POA: Diagnosis not present

## 2014-11-20 DIAGNOSIS — Q248 Other specified congenital malformations of heart: Secondary | ICD-10-CM

## 2014-11-20 LAB — D-DIMER, QUANTITATIVE: D-Dimer, Quant: 0.52 ug/mL-FEU — ABNORMAL HIGH (ref 0.00–0.48)

## 2014-11-20 LAB — BRAIN NATRIURETIC PEPTIDE: BRAIN NATRIURETIC PEPTIDE: 250.2 pg/mL — AB (ref 0.0–100.0)

## 2014-11-20 MED ORDER — FUROSEMIDE 20 MG PO TABS: 20.0000 mg | ORAL_TABLET | Freq: Every day | ORAL | Status: DC

## 2014-11-20 MED ORDER — IOHEXOL 350 MG/ML SOLN
80.0000 mL | Freq: Once | INTRAVENOUS | Status: AC | PRN
  Administered 2014-11-20: 80 mL via INTRAVENOUS

## 2014-11-20 MED ORDER — ROSUVASTATIN CALCIUM 20 MG PO TABS: 20.0000 mg | ORAL_TABLET | Freq: Every morning | ORAL | Status: DC

## 2014-11-20 NOTE — Telephone Encounter (Signed)
-----   Message from Sueanne Margarita, MD sent at 11/20/2014  4:41 PM EST ----- Chest CT showed no PE.  Mildly elevated BNP with normal LVF and diastolic dysfunction on recent echo.  Start lasix 20mg  and take 2 tablets today and 2 tablets tomorrow then starting 11/12 take 20mg  daily and check BMET in 1 week.  Have patient increase potassium in her diet.

## 2014-11-20 NOTE — Telephone Encounter (Signed)
Informed patient of results and verbal understanding expressed.  Instructed patient to START LASIX 20 mg daily. Patient understands to take 40 mg for first two days. BMET scheduled for next Thursday.  Instructed patient to increase K in her diet. Patient agrees with treatment plan.

## 2014-11-20 NOTE — Telephone Encounter (Signed)
-----   Message from Sueanne Margarita, MD sent at 11/20/2014  4:43 PM EST ----- No evidence of recurrent pericardial cyst.  No PE.  Interstitial fibrosis that is chronic. Please refer to pulmonary for evaluation of interstitial fibrosis - Dr. Chase Caller

## 2014-11-20 NOTE — Patient Instructions (Signed)
Medication Instructions:  Your physician recommends that you continue on your current medications as directed. Please refer to the Current Medication list given to you today.   Labwork: TODAY: DDIMER, BNP  Testing/Procedures: Dr. Radford Pax recommends you have a CHEST CTA.  Follow-Up: Your physician wants you to follow-up in: 1 year with Dr. Radford Pax. You will receive a reminder letter in the mail two months in advance. If you don't receive a letter, please call our office to schedule the follow-up appointment.   Any Other Special Instructions Will Be Listed Below (If Applicable).     If you need a refill on your cardiac medications before your next appointment, please call your pharmacy.

## 2014-11-20 NOTE — Telephone Encounter (Signed)
Informed patient of results and verbal understanding expressed.  Referral for Dr. Ramaswamy placed. Patient agrees with treatment plan.  

## 2014-11-21 ENCOUNTER — Telehealth: Payer: Self-pay | Admitting: Cardiology

## 2014-11-21 NOTE — Telephone Encounter (Signed)
New problem   Pt's spouse has questions concerning pt being referred to pulmonary doctor and CT scan. Please call

## 2014-11-21 NOTE — Telephone Encounter (Signed)
Reviewed CT scan results again. Patient and husband grateful for call.

## 2014-11-22 ENCOUNTER — Encounter: Payer: Self-pay | Admitting: Internal Medicine

## 2014-11-22 ENCOUNTER — Ambulatory Visit (INDEPENDENT_AMBULATORY_CARE_PROVIDER_SITE_OTHER): Payer: PPO | Admitting: Internal Medicine

## 2014-11-22 VITALS — BP 116/70 | HR 70 | Ht 59.0 in | Wt 147.0 lb

## 2014-11-22 DIAGNOSIS — R0689 Other abnormalities of breathing: Secondary | ICD-10-CM

## 2014-11-22 DIAGNOSIS — R0600 Dyspnea, unspecified: Secondary | ICD-10-CM | POA: Diagnosis not present

## 2014-11-22 NOTE — Progress Notes (Signed)
Subjective:    Patient ID: [name], female    DOB: 04-29-1945, 69 y.o.   MRN: [id number]  HPI  IOV 11/22/2014    Chief Complaint  Patient presents with  . Pulmonary Consult    Pt referred by Dr. Fransico Him for pulmonary fibrosis. Pt c/o DOE and mild dry cough. Pt states she has midsternal CP when over active, CP resolves with rest.    69 year old obese female. PResents with husband. REferred for pulm fibrosis. Hx from poatient (ppoor hx) and chart and husband  Dyspnea - insidious onset x several years. Progressive steadily x few years. SEvere. BBrought on with ltd exertion like ADLs, Relieved by rest. No asociated cough or wheeze. ECHO 10/15/14 - gr1 diast dysfn and per husband recently started on lasix without improvement. Had CTA 11/9/176 - PE ruled out butper report CT shows  ILD (not sure opn my personal visualization) and so referred.  I do find some non specific scar changes on most recent CT and new RUL nodule in 2015 and 2016.   However,  PFT 11/18/14 though shows restriction with low DLCO - suggestive of ILD - FVC 70%, TLC 69%, DLCO 52% (BMI Body mass index is 29.67 kg/(m^2). ). SHe has has several CT - they are not HRCT but as best as I can tell 2014 - NO RUL nodule then end 2015 38mm GGO RUL that persists even now Nov 2016. Hard to tell if thre is ILD  Walking desat test 185 feet x 3 laps - stopped due to dyspnea prematurely at 2 laps. Also Fatigue - did not desat at 2 lapos      has a past medical history of Irritable bowel syndrome; Hypertension; Depression; Hypothyroidism; Internal hemorrhoids; Anxiety; Heart murmur; Hyperlipidemia; Vitamin D deficiency; Chronic low back pain; Carpal tunnel syndrome; History of non-ST elevation myocardial infarction (NSTEMI); S/P drug eluting coronary stent placement; GERD (gastroesophageal reflux disease); History of hiatal hernia; Partial seizure disorder ([place]) (NEUROLOGIST-- DR Jannifer Franklin); History of supraventricular tachycardia;  History of esophageal dilatation; Gross hematuria; Renal calculus, right; Arthritis; Cervical spondylosis without myelopathy; S/P pericardial cyst excision; Coronary artery disease (CARDIOLOGIST-  DR Irish Lack); PONV (postoperative nausea and vomiting); and History of gout.   reports that she quit smoking about 47 years ago. Her smoking use included Cigarettes. She started smoking about 50 years ago. She has a .9 pack-year smoking history. She has never used smokeless tobacco.  Past Surgical History  Procedure Laterality Date  . Mediasternotomy N/A 02/05/2013    Procedure: MEDIAN STERNOTOMY;  Surgeon: Ivin Poot, MD;  Location: [facility] OR;  Service: Thoracic;  Laterality: N/A;  . Biopsy of mediastinal mass N/A 02/05/2013    Procedure: RESECTION OF MEDIASTINAL MASS;  Surgeon: Ivin Poot, MD;  Location: [facility] OR;  Service: Thoracic;  Laterality: N/A;  . Ectopic pregnancy surgery  YRS AGO    SALPINGECTOMY  . Coronary angiogram  03/10/2011    Procedure: CORONARY ANGIOGRAM;  Surgeon: Jettie Booze, MD;  Location: [facility] CATH LAB;  Service: Cardiovascular;;  . Left heart catheterization with coronary angiogram N/A 03/14/2012    Procedure: LEFT HEART CATHETERIZATION WITH CORONARY ANGIOGRAM;  Surgeon: Sueanne Margarita, MD;  Location: [place] CATH LAB;  Service: Cardiovascular;  Laterality: N/A;  Normal LM,  50% pLAD,  70% mLAD,  D2 50-70%, very tortuous LAD,  70-82% ostial LCFX,  50% in-stent restenosis of  dRCA and mRCA  stent and 50-70% ostial PDA,  normal LVSF, ef 60%  .  Percutaneous coronary stent intervention (pci-s) N/A 04/03/2012    Procedure: PERCUTANEOUS CORONARY STENT INTERVENTION (PCI-S);  Surgeon: Jettie Booze, MD;  Location: [facility] CATH LAB;  Service: Cardiovascular;  Laterality: N/A;   Successful PCI  mLAD with 2.75x12 Promus stent, postdilated to >0.50mm  . Coronary angioplasty with stent placement  11-22-2009  dr Irish Lack    Acute inferoposterior MI/  ef 60%,  PCI with DES x1 to dRCA,  25%  mLAD    . Coronary angioplasty with stent placement  06-26-2010  dr Daneen Schick    Cutting balloon angioplasty to dRCA with DES x1,  40% mLAD (non-obstructive CAD)  . Transthoracic echocardiogram  11-17-2011    grade I diastolic dysfunction/  ef 55-60%  . Cardiovascular stress test  11-22-2013  dr Irish Lack    normal lexiscan study/  no ischemia/  normal LVF and wall motion/ ef 81%  . Esophagogastroduodenoscopy (egd) with esophageal dilation  05-20-2011  . Colonoscopy  last one 2014  . Needle guided excision breast calcifications Right 08-09-2008  . Cystoscopy with retrograde pyelogram, ureteroscopy and stent placement Right 02/13/2014    Procedure: CYSTOSCOPY WITH RETROGRADE PYELOGRAM, URETEROSCOPY AND STENT PLACEMENT;  Surgeon: Bernestine Amass, MD;  Location: [facility];  Service: Urology;  Laterality: Right;  . Holmium laser application Right [id number]    Procedure: HOLMIUM LASER APPLICATION;  Surgeon: Bernestine Amass, MD;  Location: [facility];  Service: Urology;  Laterality: Right;  . Esophagogastroduodenoscopy N/A 02/15/2014    Procedure: ESOPHAGOGASTRODUODENOSCOPY (EGD);  Surgeon: Jerene Bears, MD;  Location: [facility];  Service: Endoscopy;  Laterality: N/A;    No Known Allergies  Immunization History  Administered Date(s) Administered  . Influenza,inj,Quad PF,36+ Mos 09/23/2014  . Influenza-Unspecified 10/11/2012  . Pneumococcal-Unspecified 11/15/2009    Family History  Problem Relation Age of Onset  . Breast cancer Sister   . Cancer Sister     breast  . Breast cancer      niece  . Heart disease Father   . Hypertension Father   . Emphysema Father   . Coronary artery disease Father   . Colon cancer Neg Hx   . Heart attack Mother   . CVA Mother      Current outpatient prescriptions:  .  ALPRAZolam (XANAX) 0.5 MG tablet, Take 0.5 mg by mouth at bedtime., Disp: , Rfl: 2 .  aspirin EC 81 MG tablet, Take 81 mg by mouth daily., Disp: , Rfl:  .   calcitRIOL (ROCALTROL) 0.25 MCG capsule, Take 0.25 mcg by mouth daily., Disp: , Rfl:  .  clopidogrel (PLAVIX) 75 MG tablet, Take 1 tablet (75 mg total) by mouth daily with breakfast., Disp: 30 tablet, Rfl: 10 .  diltiazem (MATZIM LA) 240 MG 24 hr tablet, Take 240 mg by mouth every morning. , Disp: , Rfl:  .  doxazosin (CARDURA) 2 MG tablet, Take 1 tablet (2 mg total) by mouth daily., Disp: 30 tablet, Rfl: 5 .  furosemide (LASIX) 20 MG tablet, Take 1 tablet (20 mg total) by mouth daily., Disp: 30 tablet, Rfl: 6 .  HYDROcodone-acetaminophen (NORCO/VICODIN) 5-325 MG per tablet, Take 1-2 tablets by mouth every 4 (four) hours as needed for moderate pain., Disp: , Rfl:  .  isosorbide mononitrate (IMDUR) 60 MG 24 hr tablet, Take 1.5 tablets (90 mg total) by mouth daily., Disp: 45 tablet, Rfl: 3 .  levETIRAcetam (KEPPRA) 500 MG tablet, Take 1 tablet (500 mg total) by mouth 2 (two) times daily., Disp:  60 tablet, Rfl: 11 .  levothyroxine (SYNTHROID, LEVOTHROID) 112 MCG tablet, Take 112 mcg by mouth daily., Disp: , Rfl: 0 .  losartan (COZAAR) 100 MG tablet, Take 100 mg by mouth every morning. , Disp: , Rfl:  .  metoprolol succinate (TOPROL-XL) 50 MG 24 hr tablet, Take 50 mg by mouth daily. Take with or immediately following a meal., Disp: , Rfl:  .  nitroGLYCERIN (NITROSTAT) 0.4 MG SL tablet, Place 1 tablet (0.4 mg total) under the tongue every 5 (five) minutes as needed for chest pain., Disp: 25 tablet, Rfl: 5 .  pantoprazole (PROTONIX) 40 MG tablet, TAKE 1 TABLET (40 MG TOTAL) BY MOUTH 2 (TWO) TIMES DAILY., Disp: 60 tablet, Rfl: 11 .  PARoxetine (PAXIL) 30 MG tablet, Take 30 mg by mouth daily., Disp: , Rfl:  .  potassium chloride SA (K-DUR,KLOR-CON) 20 MEQ tablet, Take 20 mEq by mouth 2 (two) times daily., Disp: , Rfl:  .  rosuvastatin (CRESTOR) 20 MG tablet, Take 1 tablet (20 mg total) by mouth every morning., Disp: 90 tablet, Rfl: 3    Review of Systems  Constitutional: Negative for fever and unexpected  weight change.  HENT: Negative for congestion, dental problem, ear pain, nosebleeds, postnasal drip, rhinorrhea, sinus pressure, sneezing, sore throat and trouble swallowing.   Eyes: Negative for redness and itching.  Respiratory: Positive for cough and shortness of breath. Negative for chest tightness and wheezing.   Cardiovascular: Negative for palpitations and leg swelling.  Gastrointestinal: Negative for nausea and vomiting.  Genitourinary: Negative for dysuria.  Musculoskeletal: Negative for joint swelling.  Skin: Negative for rash.  Neurological: Negative for headaches.  Hematological: Does not bruise/bleed easily.  Psychiatric/Behavioral: Negative for dysphoric mood. The patient is not nervous/anxious.        Objective:   Physical Exam  Constitutional: She is oriented to person, place, and time. She appears well-developed and well-nourished. No distress.  HENT:  Head: Normocephalic and atraumatic.  Right Ear: External ear normal.  Left Ear: External ear normal.  Mouth/Throat: Oropharynx is clear and moist. No oropharyngeal exudate.  Eyes: Conjunctivae and EOM are normal. Pupils are equal, round, and reactive to light. Right eye exhibits no discharge. Left eye exhibits no discharge. No scleral icterus.  Neck: Normal range of motion. Neck supple. No JVD present. No tracheal deviation present. No thyromegaly present.  Cardiovascular: Normal rate, regular rhythm, normal heart sounds and intact distal pulses.  Exam reveals no gallop and no friction rub.   No murmur heard. Pulmonary/Chest: Effort normal and breath sounds normal. No respiratory distress. She has no wheezes. She has no rales. She exhibits no tenderness.  ? Rt basal crackles  Abdominal: Soft. Bowel sounds are normal. She exhibits no distension and no mass. There is no tenderness. There is no rebound and no guarding.  Musculoskeletal: Normal range of motion. She exhibits no edema or tenderness.  Lymphadenopathy:    She  has no cervical adenopathy.  Neurological: She is alert and oriented to person, place, and time. She has normal reflexes. No cranial nerve deficit. She exhibits normal muscle tone. Coordination normal.  Skin: Skin is warm and dry. No rash noted. She is not diaphoretic. No erythema. No pallor.  Psychiatric: She has a normal mood and affect. Her behavior is normal. Judgment and thought content normal.  Vitals reviewed.   Filed Vitals:   11/22/14 1035  BP: 116/70  Pulse: 70  Height: 4\' 11"  (1.499 m)  Weight: 147 lb (66.679 kg)  SpO2: 93%  Assessment & Plan:     ICD-9-CM ICD-10-CM   1. Dyspnea and respiratory abnormality 786.09 R06.00     R06.89     High concern for ILD due to  prsence of dyspnea, crackles, restricted PFT but did not desaturate though this could be because possible ILD is mild or she stopped due to fatigue. She has had several CT but unsure if there is ILD in them but again they are not HRCT. I wil d/w our chest radiologist on concrn for ILD. IF there is even some concern will get HRCT and go from there in terms of ILD workup but ILD is first thing to be ruled out  She is in understanding  Dr. Brand Males, M.D., [facility].C.P Pulmonary and Critical Care Medicine Staff Physician [place] [facility] Pager: [phone], If no answer or between  15:00h - 7:00h: call [phone]  12/04/2014 4:35 PM

## 2014-11-22 NOTE — Patient Instructions (Signed)
ICD-9-CM ICD-10-CM   1. Dyspnea and respiratory abnormality 786.09 R06.00     R06.89    Will talk to thoracic radiology and get bck to you about next steps that likely include HRCT chest, autoimmune blood tests and CPST bike test

## 2014-11-28 ENCOUNTER — Telehealth: Payer: Self-pay | Admitting: Internal Medicine

## 2014-11-28 ENCOUNTER — Other Ambulatory Visit (INDEPENDENT_AMBULATORY_CARE_PROVIDER_SITE_OTHER): Payer: PPO | Admitting: *Deleted

## 2014-11-28 DIAGNOSIS — I10 Essential (primary) hypertension: Secondary | ICD-10-CM

## 2014-11-28 DIAGNOSIS — J849 Interstitial pulmonary disease, unspecified: Secondary | ICD-10-CM

## 2014-11-28 LAB — BASIC METABOLIC PANEL
BUN: 21 mg/dL (ref 7–25)
CHLORIDE: 103 mmol/L (ref 98–110)
CO2: 27 mmol/L (ref 20–31)
CREATININE: 1.4 mg/dL — AB (ref 0.50–0.99)
Calcium: 8.3 mg/dL — ABNORMAL LOW (ref 8.6–10.4)
GLUCOSE: 152 mg/dL — AB (ref 65–99)
Potassium: 3.6 mmol/L (ref 3.5–5.3)
Sodium: 140 mmol/L (ref 135–146)

## 2014-11-28 NOTE — Telephone Encounter (Signed)
Called and spoke with spouse and informed of MR response and rec Spouse voiced understanding of next step  Order placed for high res CT chest  Nothing further is needed at this time

## 2014-11-28 NOTE — Telephone Encounter (Signed)
I got email from Dr Rosario Jacks thoracic radiologist -she says unclar if there is scarring in prior CT. Patient needs to hold breath during CT and we need High res CT. So please order High Resolution CT chest without contrast on ILD protocol with prone and supine. Only  Dr Lorin Picket or Dr. Vinnie Langton to read. We will call patient with next step after ct    Per Dr Rosario Jacks email" I think the nodular stuff along the minor fissure is due to scarring, especially when you look at the reformats. We are seeing it better on the current study likely due to slightly thinner slice collimation. Stable x 1 year.  Hard to evaluate for ILD - too much respiratory motion and likely expiratory phase at lung bases. My guess is that there isn't ILD though. 02/06/14 study looked clean. Could do HRCT now if strong suspicion and clinical change since January I guess. She would need to be able to hold her breath. "

## 2014-11-28 NOTE — Telephone Encounter (Signed)
Called and spoke with pt's spouse Spouse wanted to know if MR had heard from the thoracic radiation specialist about next step for pt's care Informed spouse that I would send a message to MR asking if he had heard from specialist and what the next steps will be in pt's care  MR, please advise. Thanks

## 2014-11-29 ENCOUNTER — Telehealth: Payer: Self-pay | Admitting: Internal Medicine

## 2014-11-29 NOTE — Telephone Encounter (Signed)
Called spoke with spouse. He wanted to clarify why pt was having another CT scan. I made him aware of prior phone note: I got email from Dr Rosario Jacks thoracic radiologist -she says unclar if there is scarring in prior CT. Patient needs to hold breath during CT and we need High res CT. So please order High Resolution CT chest without contrast on ILD protocol with prone and supine. Only Dr Lorin Picket or Dr. Vinnie Langton to read. We will call patient with next step after ct  He had no further questions. Will sign off message

## 2014-12-02 ENCOUNTER — Telehealth: Payer: Self-pay

## 2014-12-02 DIAGNOSIS — I10 Essential (primary) hypertension: Secondary | ICD-10-CM

## 2014-12-02 MED ORDER — FUROSEMIDE 20 MG PO TABS: 20.0000 mg | ORAL_TABLET | ORAL | Status: DC

## 2014-12-02 MED ORDER — POTASSIUM CHLORIDE CRYS ER 20 MEQ PO TBCR: 40.0000 meq | EXTENDED_RELEASE_TABLET | ORAL | Status: DC

## 2014-12-02 NOTE — Telephone Encounter (Signed)
Notes Recorded by Sueanne Margarita, MD on 11/30/2014 at 6:41 PM Correction to above - decrease Lasix to 20mg  qod and take Lasix 67meq BID QOD (only on days she takes Lasix) and recheck BMET in 1 week Notes Recorded by Sueanne Margarita, MD on 11/30/2014 at 6:40 PM Creatinine increased - decrease Lasix to 20mg  daily and decrease Kdur to 61meq daily and recheck BMET in 1 week   Informed patient of results and verbal understanding expressed.  Instructed patient to DECREASE LASIX to 20 mg qod. Instructed patient to DECREASE KDUR to 20 meq BID qod with lasix.  BMET scheduled for next Monday. Patient agrees with treatment plan.

## 2014-12-04 ENCOUNTER — Ambulatory Visit (INDEPENDENT_AMBULATORY_CARE_PROVIDER_SITE_OTHER)
Admission: RE | Admit: 2014-12-04 | Discharge: 2014-12-04 | Disposition: A | Discharge status: Home / Self Care | Payer: PPO | Source: Ambulatory Visit | Attending: Internal Medicine | Admitting: Internal Medicine

## 2014-12-04 ENCOUNTER — Telehealth: Payer: Self-pay | Admitting: Internal Medicine

## 2014-12-04 DIAGNOSIS — J849 Interstitial pulmonary disease, unspecified: Secondary | ICD-10-CM

## 2014-12-04 NOTE — Telephone Encounter (Signed)
Very likely ILD on ct chest  Plan Have her do   - overnight pulse ox on RA  - Serum: ESR, ACE, ANA, DS-DNA, RF, anti-CCP, ssA, ssB, scl-70, ANCA screen, MPO, PR-3, Total CK,  RNP, Aldolase,  Hypersensitivity Pneumonitis Panel  ROV after this for fu - at fu needs ACCP ILD question completed  Dr. Brand Males, M.D., [facility].C.P Pulmonary and Critical Care Medicine Staff Physician [place] [facility] Pager: [phone], If no answer or between  15:00h - 7:00h: call [phone]  12/04/2014 1:24 PM   Ct Chest High Resolution  12/04/2014  CLINICAL DATA:  Chronic wheezing and shortness of breath with exertion for 2 years. Interstitial lung disease. EXAM: CT CHEST WITHOUT CONTRAST TECHNIQUE: Multidetector CT imaging of the chest was performed following the standard protocol without intravenous contrast. High resolution imaging of the lungs, as well as inspiratory and expiratory imaging, was performed. COMPARISON:  11/20/2014 chest CT angiogram. FINDINGS: Mediastinum/Nodes: Stable top-normal heart size. Stable trace pericardial fluid/thickening. Left anterior descending, left circumflex and right coronary atherosclerosis. Mitral annular and aortic valvular calcifications are present. Great vessels are normal in course and caliber. Normal visualized thyroid. Normal esophagus. No axillary adenopathy. Stable mildly enlarged 1.0 cm subcarinal node (series 2/ image 26). No additional mediastinal or appreciable hilar adenopathy. Lungs/Pleura: No pneumothorax. No pleural effusion. Right lower lobe 4 mm solid pulmonary nodule (series 5/ image 23), stable since 11/22/2013 and probably benign. No acute consolidative airspace disease, new significant solid pulmonary nodules or lung masses. There is faint coalescent ground-glass centrilobular nodularity throughout both lungs, most prominent in the right upper lobe. There is minimal scattered subpleural reticulation throughout  both lungs with mild architectural distortion in the mid to upper right lung fields. No significant regions of traction bronchiectasis or frank honeycombing. There is severe patchy air trapping throughout both lungs on the expiration sequence. Upper abdomen: Unremarkable. Musculoskeletal: No aggressive appearing focal osseous lesions. Moderate degenerative changes in the thoracic spine. Sternotomy wires are intact. IMPRESSION: 1. Faint coalescent ground-glass centrilobular nodularity and minimal scattered subpleural reticulation throughout both lungs. Severe patchy air trapping throughout both lungs. This combination of findings is strongly suggestive of subacute on early chronic hypersensitivity pneumonitis. 2. Mild subcarinal mediastinal adenopathy, nonspecific, likely reactive. Electronically Signed   By: Ilona Sorrel M.D.   On: 12/04/2014 10:25

## 2014-12-06 NOTE — Telephone Encounter (Signed)
Left message to call back  

## 2014-12-09 ENCOUNTER — Other Ambulatory Visit (INDEPENDENT_AMBULATORY_CARE_PROVIDER_SITE_OTHER): Payer: PPO | Admitting: *Deleted

## 2014-12-09 DIAGNOSIS — I10 Essential (primary) hypertension: Secondary | ICD-10-CM | POA: Diagnosis not present

## 2014-12-09 LAB — BASIC METABOLIC PANEL
BUN: 17 mg/dL (ref 7–25)
CALCIUM: 8.3 mg/dL — AB (ref 8.6–10.4)
CO2: 22 mmol/L (ref 20–31)
CREATININE: 1.25 mg/dL — AB (ref 0.50–0.99)
Chloride: 106 mmol/L (ref 98–110)
Glucose, Bld: 125 mg/dL — ABNORMAL HIGH (ref 65–99)
Potassium: 3.6 mmol/L (ref 3.5–5.3)
Sodium: 141 mmol/L (ref 135–146)

## 2014-12-09 NOTE — Telephone Encounter (Signed)
[phone], pt cb

## 2014-12-09 NOTE — Telephone Encounter (Signed)
Called and spoke to pt. Informed pt of the results and recs per MR. Order placed for labs and ONO. Appt made with TP for 12/26/14. Pt verbalized understanding and denied any further questions or concerns at this time.

## 2014-12-10 ENCOUNTER — Other Ambulatory Visit (INDEPENDENT_AMBULATORY_CARE_PROVIDER_SITE_OTHER): Payer: PPO

## 2014-12-10 DIAGNOSIS — J849 Interstitial pulmonary disease, unspecified: Secondary | ICD-10-CM | POA: Diagnosis not present

## 2014-12-10 LAB — CARDIAC PANEL
CK TOTAL: 62 U/L (ref 7–177)
CK-MB: 1 ng/mL (ref 0.3–4.0)
Relative Index: 1.6 calc (ref 0.0–2.5)

## 2014-12-11 LAB — SEDIMENTATION RATE: SED RATE: 38 mm/h — AB (ref 0–30)

## 2014-12-11 LAB — ANCA SCREEN W REFLEX TITER: ANCA Screen: NEGATIVE

## 2014-12-12 LAB — ANA: Anti Nuclear Antibody(ANA): POSITIVE — AB

## 2014-12-12 LAB — ANTI-NUCLEAR AB-TITER (ANA TITER): ANA Titer 1: 1:40 {titer} — ABNORMAL HIGH

## 2014-12-12 LAB — RNP ANTIBODY: RIBONUCLEIC PROTEIN(ENA) ANTIBODY, IGG: NEGATIVE

## 2014-12-13 ENCOUNTER — Telehealth: Payer: Self-pay | Admitting: Internal Medicine

## 2014-12-13 LAB — SJOGRENS SYNDROME-A EXTRACTABLE NUCLEAR ANTIBODY: SSA (RO) (ENA) ANTIBODY, IGG: NEGATIVE

## 2014-12-13 LAB — ANGIOTENSIN CONVERTING ENZYME: ANGIOTENSIN-CONVERTING ENZYME: 46 U/L (ref 8–52)

## 2014-12-13 LAB — ANTI-DNA ANTIBODY, DOUBLE-STRANDED: DS DNA AB: 1 [IU]/mL

## 2014-12-13 LAB — ANTI-SCLERODERMA ANTIBODY: SCLERODERMA (SCL-70) (ENA) ANTIBODY, IGG: NEGATIVE

## 2014-12-13 LAB — RHEUMATOID FACTOR

## 2014-12-13 LAB — MPO/PR-3 (ANCA) ANTIBODIES: Serine Protease 3: <1

## 2014-12-13 LAB — SJOGRENS SYNDROME-B EXTRACTABLE NUCLEAR ANTIBODY: SSB (La) (ENA) Antibody, IgG: <1

## 2014-12-13 NOTE — Telephone Encounter (Signed)
Order was sent to [facility].  Order was confirmed by Rodena Piety @[facility]  on 12/10/14.

## 2014-12-13 NOTE — Telephone Encounter (Signed)
Called spoke with kristie from [facility]. She will call pt today. Called spoke with Fritz Pickerel and made aware.  Nothing further needed

## 2014-12-13 NOTE — Telephone Encounter (Signed)
ONO was ordered on 11/28. Can not tell where this was sent to call. Please advise PCC's thanks

## 2014-12-16 LAB — CYCLIC CITRUL PEPTIDE ANTIBODY, IGG: Cyclic Citrullin Peptide Ab: <16 Units

## 2014-12-18 ENCOUNTER — Telehealth: Payer: Self-pay | Admitting: Internal Medicine

## 2014-12-18 NOTE — Telephone Encounter (Signed)
[name]  You are seeing her 12?15/16. I saw her for dyspnea and restriced PFT. HRCT confirms some ILD. Autoimmune so far is negative. She is only 79 and CT is now c/w UIP/IPF. Ideally she needs surgical lung bx. She has many med problems. So there is some risk but atleast EF and creat and alb ok from risk standpoint.   Will need referral to Dr Roxan Hockey after neew year to eval for biospy  Thanks  Dr. Brand Males, M.D., [facility].C.P Pulmonary and Critical Care Medicine Staff Physician [place] [facility] Pager: [phone], If no answer or between  15:00h - 7:00h: call [phone]  12/18/2014 2:05 PM

## 2014-12-23 ENCOUNTER — Telehealth: Payer: Self-pay | Admitting: Internal Medicine

## 2014-12-23 ENCOUNTER — Telehealth: Payer: Self-pay | Admitting: Cardiology

## 2014-12-23 ENCOUNTER — Other Ambulatory Visit: Payer: Self-pay | Admitting: Cardiology

## 2014-12-23 DIAGNOSIS — R0600 Dyspnea, unspecified: Secondary | ICD-10-CM

## 2014-12-23 DIAGNOSIS — R0689 Other abnormalities of breathing: Principal | ICD-10-CM

## 2014-12-23 NOTE — Telephone Encounter (Signed)
New Message  Pt husband calling to clarify pt's [place]- pt husband stated that her PCP also prescribes this med. Please call back and discuss.

## 2014-12-23 NOTE — Telephone Encounter (Signed)
Spoke with patient's husband who states patient is receiving 2 prescriptions of K-[place] Con, one from PCP Dr. Tamala Julian and one from Dr. Radford Pax.  He asked if patient should continue to take 40 meq every other day as directed by Dr. Radford Pax on 11/21; he states this is more recent instruction given. He states she has not seen Dr. Tamala Julian or had lab work ordered by her since that time.  I advised him to continue this dose as this is supported by recent lab work and instruction from Dr. Radford Pax and to call [facility] to cancel the Rx from Dr. Tamala Julian.  He verbalized understanding and agreement.

## 2014-12-23 NOTE — Telephone Encounter (Signed)
Called Mandy with [facility] to advise on request for order. States that ONO was faxed to MR 12/17/14 and she is wanting to know if O2 is going to be ordered.  Lowest desat was 77% with a total of 402 desats. Please advise Elise/MR if results have been received. These are also being re-faxed in the even that you do not have them.

## 2014-12-25 NOTE — Telephone Encounter (Signed)
Patient returned called, she may be reached at [phone].

## 2014-12-25 NOTE — Telephone Encounter (Signed)
It is not yet in my look at  But based on that pleas start 2L o2 at night

## 2014-12-25 NOTE — Telephone Encounter (Signed)
lmtcb x1 for pt. 

## 2014-12-25 NOTE — Telephone Encounter (Signed)
LMTCB

## 2014-12-25 NOTE — Telephone Encounter (Signed)
Pt is aware of ONO results. Order has been placed for oxygen. Nothing further was needed. 

## 2014-12-26 ENCOUNTER — Other Ambulatory Visit: Payer: PPO

## 2014-12-26 ENCOUNTER — Encounter: Payer: Self-pay | Admitting: Adult Health

## 2014-12-26 ENCOUNTER — Ambulatory Visit (INDEPENDENT_AMBULATORY_CARE_PROVIDER_SITE_OTHER): Payer: PPO | Admitting: Adult Health

## 2014-12-26 VITALS — BP 120/82 | HR 64 | Ht 59.0 in | Wt 149.6 lb

## 2014-12-26 DIAGNOSIS — J8410 Pulmonary fibrosis, unspecified: Secondary | ICD-10-CM | POA: Diagnosis not present

## 2014-12-26 DIAGNOSIS — J9611 Chronic respiratory failure with hypoxia: Secondary | ICD-10-CM

## 2014-12-26 DIAGNOSIS — J849 Interstitial pulmonary disease, unspecified: Secondary | ICD-10-CM

## 2014-12-26 DIAGNOSIS — J9610 Chronic respiratory failure, unspecified whether with hypoxia or hypercapnia: Secondary | ICD-10-CM | POA: Insufficient documentation

## 2014-12-26 NOTE — Assessment & Plan Note (Signed)
Begin oxygen at bedtime as directed at 2 L

## 2014-12-26 NOTE — Assessment & Plan Note (Addendum)
Pulmonary Fibrosis noted on CT ? Etiology  Autoimmune panel neg except for ANA  Will resend HP panel  Consider refer to Rheumatology vs TCTS for consideration of bx  Needs PFT on return   Case discussed with Dr. Chase Caller , will refer to Dr. Lawson Fiscal for evaluation for possible lung bx.

## 2014-12-26 NOTE — Patient Instructions (Signed)
Labs today .  I will call with next step regarding workup .  Continue on oxygen At bedtime   Follow up Dr. Chase Caller in 6 weeks with PFT and As needed

## 2014-12-26 NOTE — Progress Notes (Signed)
Subjective:    Patient ID: [name], female    DOB: 12-08-1945, 69 y.o.   MRN: [id number]  HPI 69 year old female seen for pulmonary consult for possible pulmonary fibrosis on 11/22/2014 with Dr. Chase Caller.  12/26/2014 Follow up : PF patient returns for a one-month follow-up. Patient was seen for a pulmonary consult last month for possible pulmonary fibrosis.  She has had a progressive shortness of breath with activity over the last few years.  Does feel that this is worsened over the last 6 months. She had a CT chest angio on November 9 that was negative for pulmonary embolism, but showed unchanged pulmonary fibrosis.. Does have a history of a pericardial cyst, status post removal in January 2015. She has had an extensive workup with cardiology with a negative nuclear stress test. She has a remote history of smoking but quit greater than 45 years ago. She did have a positive secondhand smoke exposure. She is retired. No known occupational exposures. She has no unusual hobbies or travel. She denies any methotrexate amiodarone or Macrodantin known exposure. She has a family history of COPD and [place] and family members that smoked. Denies any known history of lupus, rheumatoid arthritis. Patient was set up for a high resolution CT chest that showed faint groundglass nodularity throughout both lungs.  Patient was set up for an overnight oximetry test that showed positive desaturations. She was started on oxygen at 2 L. Patient says this was set up this morning.[employer] Immune panel was negative except for a positive ANA. Hypersensitivity pneumonitis panel was not done.     Past Medical History  Diagnosis Date  . Irritable bowel syndrome   . Hypertension   . Depression   . Hypothyroidism   . Internal hemorrhoids   . Anxiety   . Heart murmur   . Hyperlipidemia   . Vitamin D deficiency   . Chronic low back pain   . Carpal tunnel syndrome   . History of non-ST elevation myocardial  infarction (NSTEMI)     2011--  S/P PCI WITH SENTING  . S/P drug eluting coronary stent placement     X3  in 2011, 2012, 2014  . GERD (gastroesophageal reflux disease)   . History of hiatal hernia   . Partial seizure disorder (HCC) NEUROLOGIST-- DR WILLIS    NOCTURNAL  . History of supraventricular tachycardia     2011-- resolved  . History of esophageal dilatation     FOR STRIUCTURE  . Gross hematuria   . Renal calculus, right   . Arthritis   . Cervical spondylosis without myelopathy   . S/P pericardial cyst excision     02-05-2013  benign  . Coronary artery disease CARDIOLOGIST-  DR Irish Lack    hx NSTEMI inferoposterior 2011 s/p PCI  total x3 DES's  . PONV (postoperative nausea and vomiting)   . History of gout    Current Outpatient Prescriptions on File Prior to Visit  Medication Sig Dispense Refill  . ALPRAZolam (XANAX) 0.5 MG tablet Take 0.5 mg by mouth at bedtime.  2  . aspirin EC 81 MG tablet Take 81 mg by mouth daily.    . calcitRIOL (ROCALTROL) 0.25 MCG capsule Take 0.25 mcg by mouth daily.    . clopidogrel (PLAVIX) 75 MG tablet Take 1 tablet (75 mg total) by mouth daily with breakfast. 30 tablet 10  . diltiazem (MATZIM LA) 240 MG 24 hr tablet Take 240 mg by mouth every morning.     Marland Kitchen  doxazosin (CARDURA) 2 MG tablet Take 1 tablet (2 mg total) by mouth daily. 30 tablet 5  . furosemide (LASIX) 20 MG tablet Take 1 tablet (20 mg total) by mouth every other day. 15 tablet 6  . HYDROcodone-acetaminophen (NORCO/VICODIN) 5-325 MG per tablet Take 1-2 tablets by mouth every 4 (four) hours as needed for moderate pain.    . isosorbide mononitrate (IMDUR) 60 MG 24 hr tablet TAKE 1 AND 1/2 TABLETS BY MOUTH EVERY DAY 45 tablet 10  . levETIRAcetam (KEPPRA) 500 MG tablet Take 1 tablet (500 mg total) by mouth 2 (two) times daily. 60 tablet 11  . levothyroxine (SYNTHROID, LEVOTHROID) 112 MCG tablet Take 112 mcg by mouth daily.  0  . losartan (COZAAR) 100 MG tablet Take 100 mg by mouth every  morning.     . metoprolol succinate (TOPROL-XL) 50 MG 24 hr tablet Take 50 mg by mouth daily. Take with or immediately following a meal.    . nitroGLYCERIN (NITROSTAT) 0.4 MG SL tablet Place 1 tablet (0.4 mg total) under the tongue every 5 (five) minutes as needed for chest pain. 25 tablet 5  . pantoprazole (PROTONIX) 40 MG tablet TAKE 1 TABLET (40 MG TOTAL) BY MOUTH 2 (TWO) TIMES DAILY. 60 tablet 11  . PARoxetine (PAXIL) 30 MG tablet Take 30 mg by mouth daily.    . potassium chloride SA (K-DUR,KLOR-CON) 20 MEQ tablet Take 2 tablets (40 mEq total) by mouth every other day. Take with Lasix. 30 tablet 6  . rosuvastatin (CRESTOR) 20 MG tablet Take 1 tablet (20 mg total) by mouth every morning. 90 tablet 3   No current facility-administered medications on file prior to visit.      Review of Systems Constitutional:   No  weight loss, night sweats,  Fevers, chills, fatigue, or  lassitude.  HEENT:   No headaches,  Difficulty swallowing,  Tooth/dental problems, or  Sore throat,                No sneezing, itching, ear ache, nasal congestion, post nasal drip,   CV:  No chest pain,  Orthopnea, PND, swelling in lower extremities, anasarca, dizziness, palpitations, syncope.   GI  No heartburn, indigestion, abdominal pain, nausea, vomiting, diarrhea, change in bowel habits, loss of appetite, bloody stools.   Resp:   No chest wall deformity  Skin: no rash or lesions.  GU: no dysuria, change in color of urine, no urgency or frequency.  No flank pain, no hematuria   MS:  No joint pain or swelling.  No decreased range of motion.  No back pain.  Psych:  No change in mood or affect. No depression or anxiety.  No memory loss.         Objective:   Physical Exam  Filed Vitals:   12/26/14 1212  BP: 120/82  Pulse: 64  Height: 4\' 11"  (1.499 m)  Weight: 149 lb 9.6 oz (67.858 kg)  SpO2: 97%    GEN: A/Ox3; pleasant , NAD, elderly   HEENT:  [place]/AT,  EACs-clear, TMs-wnl, NOSE-clear,  THROAT-clear, no lesions, no postnasal drip or exudate noted.   NECK:  Supple w/ fair ROM; no JVD; normal carotid impulses w/o bruits; no thyromegaly or nodules palpated; no lymphadenopathy.  RESP  Decreased BS in bases no accessory muscle use, no dullness to percussion  CARD:  RRR, no m/r/g  , no peripheral edema, pulses intact, no cyanosis or clubbing.  GI:   Soft & nt; nml bowel sounds; no organomegaly or  masses detected.  Musco: Warm bil, no deformities or joint swelling noted.   Neuro: alert, no focal deficits noted.    Skin: Warm, no lesions or rashes        Assessment & Plan:

## 2014-12-30 ENCOUNTER — Telehealth: Payer: Self-pay | Admitting: Adult Health

## 2014-12-30 DIAGNOSIS — J849 Interstitial pulmonary disease, unspecified: Secondary | ICD-10-CM

## 2014-12-30 NOTE — Telephone Encounter (Addendum)
Referral for Dr. Prescott Gum was placed. Patient is aware and scheduled for 01/08/15 with Dr. Prescott Gum.  Patient was advised by TP at ov on 12/26/14 for a 6 week follow up and PFT Patient is scheduled for a PFT on 01/14/14 at 12pm.  MR is in office that afternoon but has no availability   MR please advise if okay to double book

## 2014-12-30 NOTE — Telephone Encounter (Signed)
Called pt back and discussed need for referral to Dr. Darcey Nora for consideration for lung bx.  Pt aware

## 2014-12-30 NOTE — Telephone Encounter (Signed)
She does not need to see me 01/25/15 Instead, she needs PFT ideally before she sees Rolland Bimler on 01/08/15  So, slight change in plan 1. Cancel PFT 01/15/15 2. Order PFT between 12/30/2014 and 01/07/15 before she sees Nils Pyle on 01/08/15- will help Dr Nils Pyle and myself in risk assessment for surgical lung bx 3. I will see her some 2-4 weeks after she has a biopsy - so we will decide that based on Dr Lucianne Lei Tright bx   Thanks  Dr. Brand Males, M.D., [facility].C.P Pulmonary and Critical Care Medicine Staff Physician [facility] Pager: [phone], If no answer or between  15:00h - 7:00h: call [phone]  12/30/2014 6:09 PM

## 2014-12-30 NOTE — Telephone Encounter (Signed)
Patient states that she received a call from TP to discuss her lab results. Did not see results in chart.  TP - did you try to contact patient?  Patient states she will be at [phone]

## 2014-12-31 NOTE — Telephone Encounter (Signed)
Spoke with pt and spouse, states they were returning a call from about 20 minutes ago.  I looked through chart and did not see where anyone in our office had called them.  Re-relayed the below results to them.  Nothing further needed at this time.

## 2014-12-31 NOTE — Telephone Encounter (Signed)
Patient notified of MR's recommendations below. Patient's PFT cancelled for 01/15/15 and rescheduled for 01/02/16. Will send message to [facility] for follow up appointment with MR in 2-4 weeks after Biopsy

## 2014-12-31 NOTE — Telephone Encounter (Signed)
Pt returned call [phone]

## 2015-01-02 ENCOUNTER — Ambulatory Visit (INDEPENDENT_AMBULATORY_CARE_PROVIDER_SITE_OTHER): Payer: PPO | Admitting: Internal Medicine

## 2015-01-02 DIAGNOSIS — J849 Interstitial pulmonary disease, unspecified: Secondary | ICD-10-CM | POA: Diagnosis not present

## 2015-01-02 LAB — PULMONARY FUNCTION TEST
DL/VA % pred: 83 %
DL/VA: 3.41 ml/min/mmHg/L
DLCO UNC % PRED: 59 %
DLCO UNC: 10.44 ml/min/mmHg
FEF 25-75 POST: 1 L/s
FEF 25-75 PRE: 1.13 L/s
FEF2575-%CHANGE-POST: -11 %
FEF2575-%PRED-POST: 59 %
FEF2575-%PRED-PRE: 67 %
FEV1-%Change-Post: -3 %
FEV1-%PRED-POST: 62 %
FEV1-%PRED-PRE: 64 %
FEV1-POST: 1.16 L
FEV1-Pre: 1.2 L
FEV1FVC-%CHANGE-POST: 7 %
FEV1FVC-%PRED-PRE: 105 %
FEV6-%CHANGE-POST: -10 %
FEV6-%PRED-POST: 57 %
FEV6-%Pred-Pre: 64 %
FEV6-PRE: 1.5 L
FEV6-Post: 1.34 L
FEV6FVC-%CHANGE-POST: 0 %
FEV6FVC-%PRED-PRE: 105 %
FEV6FVC-%Pred-Post: 104 %
FVC-%Change-Post: -10 %
FVC-%PRED-POST: 54 %
FVC-%Pred-Pre: 61 %
FVC-Post: 1.35 L
FVC-Pre: 1.5 L
POST FEV1/FVC RATIO: 86 %
PRE FEV1/FVC RATIO: 80 %
Post FEV6/FVC ratio: 99 %
Pre FEV6/FVC Ratio: 100 %
RV % PRED: 69 %
RV: 1.32 L
TLC % pred: 72 %
TLC: 3.11 L

## 2015-01-02 NOTE — Progress Notes (Signed)
PFT done today. 

## 2015-01-03 LAB — HYPERSENSITIVITY PNUEMONITIS PROFILE

## 2015-01-03 NOTE — Progress Notes (Signed)
Quick Note:  Called and spoke with pt and her husband. Reviewed results and recs. Pt voiced understanding and had no further questions. ______

## 2015-01-08 ENCOUNTER — Ambulatory Visit: Payer: PPO | Admitting: Cardiothoracic Surgery

## 2015-01-09 ENCOUNTER — Other Ambulatory Visit: Payer: Self-pay | Admitting: *Deleted

## 2015-01-09 ENCOUNTER — Encounter: Payer: Self-pay | Admitting: Cardiothoracic Surgery

## 2015-01-09 ENCOUNTER — Ambulatory Visit (INDEPENDENT_AMBULATORY_CARE_PROVIDER_SITE_OTHER): Payer: PPO | Admitting: Cardiothoracic Surgery

## 2015-01-09 VITALS — HR 70 | Resp 16 | Ht 59.0 in | Wt 148.0 lb

## 2015-01-09 DIAGNOSIS — J8410 Pulmonary fibrosis, unspecified: Secondary | ICD-10-CM

## 2015-01-09 DIAGNOSIS — R0602 Shortness of breath: Secondary | ICD-10-CM | POA: Diagnosis not present

## 2015-01-09 NOTE — Progress Notes (Signed)
PCP is Reginia Naas, MD Referring Provider is Parrett, Fonnie Mu, NP  Chief Complaint  Patient presents with  . Shortness of Breath    eval for LUNG BX.Marland KitchenMarland KitchenCTA CHEST 11/20/14, CT CHEST 12/04/14.Marland KitchenMarland KitchenPFT 01/02/15  patient examined, chest CT scan, echocardiogram, PFTs all personally reviewed  HPI: 69 year old Caucasian female nonsmoker with progressive pulmonary functional decline over the past year and on home oxygen at night and with reduced exercise tolerance. She has been carefully evaluated by Dr. Chase Caller of pulmonary medicine. PFTs from 2 years ago had declined with FVC of 60% of predicted down from 72 percent 2 years ago and FEV1 at 60% of predicted down from 70% 2 years ago.current diffusion capacity is 59% of predicted, unchanged. Her high definition chest CT scan shows evidence of hypersensitivity pneumonitis and her pulmonologist has requested a lung biopsy which I think is very appropriate to help direct therapy on this middle-aged patient.  2 years ago the patient underwent sternotomy and resection of a large pericardial cyst which was benign. At that time she had no particular pulmonary issues post operatively.  The patient's cardiac status is stable after having had a drug-eluting stent placed 3 years ago. Echocardiogram performed a few months ago showed normal LVEF, 55-60%.. She is  still taking Plavix but this could be stopped for a few days prior to her procedure now over 2 years after deployment of the stent.  Past Medical History  Diagnosis Date  . Irritable bowel syndrome   . Hypertension   . Depression   . Hypothyroidism   . Internal hemorrhoids   . Anxiety   . Heart murmur   . Hyperlipidemia   . Vitamin D deficiency   . Chronic low back pain   . Carpal tunnel syndrome   . History of non-ST elevation myocardial infarction (NSTEMI)     2011--  S/P PCI WITH SENTING  . S/P drug eluting coronary stent placement     X3  in 2011, 2012, 2014  . GERD (gastroesophageal  reflux disease)   . History of hiatal hernia   . Partial seizure disorder (HCC) NEUROLOGIST-- DR WILLIS    NOCTURNAL  . History of supraventricular tachycardia     2011-- resolved  . History of esophageal dilatation     FOR STRIUCTURE  . Gross hematuria   . Renal calculus, right   . Arthritis   . Cervical spondylosis without myelopathy   . S/P pericardial cyst excision     02-05-2013  benign  . Coronary artery disease CARDIOLOGIST-  DR Irish Lack    hx NSTEMI inferoposterior 2011 s/p PCI  total x3 DES's  . PONV (postoperative nausea and vomiting)   . History of gout     Past Surgical History  Procedure Laterality Date  . Mediasternotomy N/A 02/05/2013    Procedure: MEDIAN STERNOTOMY;  Surgeon: Ivin Poot, MD;  Location[facility];  Service: Thoracic;  Laterality: N/A;  . Biopsy of mediastinal mass N/A 02/05/2013    Procedure: RESECTION OF MEDIASTINAL MASS;  Surgeon: Ivin Poot, MD;  Location: [facility] OR;  Service: Thoracic;  Laterality: N/A;  . Ectopic pregnancy surgery  YRS AGO    SALPINGECTOMY  . Coronary angiogram  03/10/2011    Procedure: CORONARY ANGIOGRAM;  Surgeon: Jettie Booze, MD;  Location: [facility] CATH LAB;  Service: Cardiovascular;;  . Left heart catheterization with coronary angiogram N/A 03/14/2012    Procedure: LEFT HEART CATHETERIZATION WITH CORONARY ANGIOGRAM;  Surgeon: Sueanne Margarita, MD;  Location: [facility] CATH  LAB;  Service: Cardiovascular;  Laterality: N/A;  Normal LM,  50% pLAD,  70% mLAD,  D2 50-70%, very tortuous LAD,  70-82% ostial LCFX,  50% in-stent restenosis of  dRCA and mRCA  stent and 50-70% ostial PDA,  normal LVSF, ef 60%  . Percutaneous coronary stent intervention (pci-s) N/A 04/03/2012    Procedure: PERCUTANEOUS CORONARY STENT INTERVENTION (PCI-S);  Surgeon: Jettie Booze, MD;  Location: [facility] CATH LAB;  Service: Cardiovascular;  Laterality: N/A;   Successful PCI  mLAD with 2.75x12 Promus stent, postdilated to >0.28mm  . Coronary angioplasty with stent  placement  11-22-2009  dr Irish Lack    Acute inferoposterior MI/  ef 60%,  PCI with DES x1 to dRCA,  25%  mLAD  . Coronary angioplasty with stent placement  06-26-2010  dr Daneen Schick    Cutting balloon angioplasty to dRCA with DES x1,  40% mLAD (non-obstructive CAD)  . Transthoracic echocardiogram  11-17-2011    grade I diastolic dysfunction/  ef 55-60%  . Cardiovascular stress test  11-22-2013  dr Irish Lack    normal lexiscan study/  no ischemia/  normal LVF and wall motion/ ef 81%  . Esophagogastroduodenoscopy (egd) with esophageal dilation  05-20-2011  . Colonoscopy  last one 2014  . Needle guided excision breast calcifications Right 08-09-2008  . Cystoscopy with retrograde pyelogram, ureteroscopy and stent placement Right 02/13/2014    Procedure: CYSTOSCOPY WITH RETROGRADE PYELOGRAM, URETEROSCOPY AND STENT PLACEMENT;  Surgeon: Bernestine Amass, MD;  [facility];  Service: Urology;  Laterality: Right;  . Holmium laser application Right [id number]    Procedure: HOLMIUM LASER APPLICATION;  Surgeon: Bernestine Amass, MD;  Location: [facility];  Service: Urology;  Laterality: Right;  . Esophagogastroduodenoscopy N/A 02/15/2014    Procedure: ESOPHAGOGASTRODUODENOSCOPY (EGD);  Surgeon: Jerene Bears, MD;  Location: [facility];  Service: Endoscopy;  Laterality: N/A;    Family History  Problem Relation Age of Onset  . Breast cancer Sister   . Cancer Sister     breast  . Breast cancer      niece  . Heart disease Father   . Hypertension Father   . Emphysema Father   . Coronary artery disease Father   . Colon cancer Neg Hx   . Heart attack Mother   . CVA Mother     Social History Social History  Substance Use Topics  . Smoking status: Former Smoker -- 0.30 packs/day for 3 years    Types: Cigarettes    Start date: 11/08/1964    Quit date: 11/09/1967  . Smokeless tobacco: Never Used  . Alcohol Use: No    Current Outpatient Prescriptions  Medication  Sig Dispense Refill  . ALPRAZolam (XANAX) 0.5 MG tablet Take 0.5 mg by mouth at bedtime.  2  . aspirin EC 81 MG tablet Take 81 mg by mouth daily.    . calcitRIOL (ROCALTROL) 0.25 MCG capsule Take 0.25 mcg by mouth daily.    . clopidogrel (PLAVIX) 75 MG tablet Take 1 tablet (75 mg total) by mouth daily with breakfast. 30 tablet 10  . diltiazem (MATZIM LA) 240 MG 24 hr tablet Take 240 mg by mouth every morning.     Marland Kitchen doxazosin (CARDURA) 2 MG tablet Take 1 tablet (2 mg total) by mouth daily. 30 tablet 5  . furosemide (LASIX) 20 MG tablet Take 1 tablet (20 mg total) by mouth every other day. 15 tablet 6  . HYDROcodone-acetaminophen (NORCO/VICODIN) 5-325 MG per tablet  Take 1-2 tablets by mouth every 4 (four) hours as needed for moderate pain.    . isosorbide mononitrate (IMDUR) 60 MG 24 hr tablet TAKE 1 AND 1/2 TABLETS BY MOUTH EVERY DAY 45 tablet 10  . levETIRAcetam (KEPPRA) 500 MG tablet Take 1 tablet (500 mg total) by mouth 2 (two) times daily. 60 tablet 11  . levothyroxine (SYNTHROID, LEVOTHROID) 112 MCG tablet Take 112 mcg by mouth daily.  0  . losartan (COZAAR) 100 MG tablet Take 100 mg by mouth every morning.     . metoprolol succinate (TOPROL-XL) 50 MG 24 hr tablet Take 50 mg by mouth daily. Take with or immediately following a meal.    . nitroGLYCERIN (NITROSTAT) 0.4 MG SL tablet Place 1 tablet (0.4 mg total) under the tongue every 5 (five) minutes as needed for chest pain. 25 tablet 5  . pantoprazole (PROTONIX) 40 MG tablet TAKE 1 TABLET (40 MG TOTAL) BY MOUTH 2 (TWO) TIMES DAILY. 60 tablet 11  . PARoxetine (PAXIL) 30 MG tablet Take 30 mg by mouth daily.    . potassium chloride SA (K-DUR,KLOR-CON) 20 MEQ tablet Take 2 tablets (40 mEq total) by mouth every other day. Take with Lasix. 30 tablet 6  . rosuvastatin (CRESTOR) 20 MG tablet Take 1 tablet (20 mg total) by mouth every morning. 90 tablet 3   No current facility-administered medications for this visit.    No Known Allergies  Review  of Systems        Review of Systems :  [ y ] = yes, [  ] = no        General :  Weight gain [   ]    Weight loss  [ no  ]  Fatigue [  ]  Fever [  ]  Chills  [  ]                                Weakness  [  ]           Cardiac :  Chest pain/ pressure [ no ]  Resting SOB [  ] exertional SOB [ yes ]                        Orthopnea [  ]  Pedal edema  [  ]  Palpitations [  ] Syncope/presyncope [ ]                         Paroxysmal nocturnal dyspnea [  ]        Pulmonary : cough [  ]  wheezing [  ]  Hemoptysis [  ] Sputum [  ] Snoring [  ]no evidence of sleep apnea after sleep study                              Pneumothorax [  ]  Sleep apnea [ no ]       GI : Vomiting [  ]  Dysphagia [  ]  Melena  [  ]  Abdominal pain [  ] BRBPR [  ]              Heart burn [  ]  Constipation [  ] Diarrhea  [  ] Colonoscopy [  ]       GU :  Hematuria [  ]  Dysuria [  ]  Nocturia [  ] UTI's [  ]       Vascular : Claudication [  ]  Rest pain [  ]  DVT [  ] Vein stripping [  ] leg ulcers [  ]                          TIA [  ] Stroke [  ]  Varicose veins [  ]       NEURO :  Headaches  [  ] Seizures [  ] Vision changes [  ] Paresthesias [  ]       Musculoskeletal :  Arthritis [  ] Gout  [  ]  Back pain [  ]  Joint pain [  ]       Skin :  Rash [  ]  Melanoma [  ]        Heme : Bleeding problems [  ]Clotting Disorders [  ] Anemia [  ]Blood Transfusion [ ] takes Plavix chronically       Endocrine : Diabetes [no  ] Thyroid Disorder  [  ]       Psych : Depression [  ]  Anxiety [  ]  Psych hospitalizations [  ]                                               Pulse 70  Resp 16  Ht 4\' 11"  (1.499 m)  Wt 148 lb (67.132 kg)  BMI 29.88 kg/m2  SpO2 98% Physical Exam  Ambulated  the patient in the office hallway on room air--oxygen saturation was maintained at 97% at rest and exercise     Physical Exam  General: mildly obese middle-aged Caucasian female no acute distress HEENT: Normocephalic pupils equal , dentition  adequate Neck: Supple without JVD, adenopathy, or bruit Chest: Clear to auscultation, symmetrical breath sounds, no rhonchi, no tenderness             or deformity. Well-healed sternal incision. Cardiovascular: Regular rate and rhythm, no murmur, no gallop, peripheral pulses             palpable in all extremities Abdomen:  Soft, nontender, no palpable mass or organomegaly Extremities: Warm, well-perfused, no clubbing cyanosis edema or tenderness,              no venous stasis changes of the legs Rectal/GU: Deferred Neuro: Grossly non--focal and symmetrical throughout Skin: Clean and dry without rash or ulceration   Diagnostic Tests: High-definition CT scan shows evidence of interstitial pulmonary disease-slightly worse on the right than the left lung.  PFTs are adequate for general anesthesia and VATS. Echocardiogram shows EF 55-60%  Impression: Symptomatic deterioration with evidence of interstitial lung disease on scans We'll proceed with right VATS and lung biopsy as recommended by her pulmonologist Dr. Chase Caller Surgery scheduled for January 10 at [facility] hospital.  Plan: Return for surgery on January 10. Details of the procedure including risks alternatives benefits and expected recovery were discussed with patient and family.  Len Childs, MD [facility] [phone]

## 2015-01-16 DIAGNOSIS — D485 Neoplasm of uncertain behavior of skin: Secondary | ICD-10-CM | POA: Diagnosis not present

## 2015-01-16 DIAGNOSIS — L905 Scar conditions and fibrosis of skin: Secondary | ICD-10-CM | POA: Diagnosis not present

## 2015-01-17 ENCOUNTER — Other Ambulatory Visit: Payer: Self-pay | Admitting: *Deleted

## 2015-01-17 ENCOUNTER — Encounter: Payer: Self-pay | Admitting: *Deleted

## 2015-01-17 ENCOUNTER — Encounter (HOSPITAL_COMMUNITY): Payer: Self-pay

## 2015-01-17 ENCOUNTER — Encounter: Payer: Self-pay | Admitting: Internal Medicine

## 2015-01-17 ENCOUNTER — Encounter (HOSPITAL_COMMUNITY)
Admission: RE | Admit: 2015-01-17 | Discharge: 2015-01-17 | Disposition: A | Discharge status: Home / Self Care | Payer: PPO | Source: Ambulatory Visit | Attending: Cardiothoracic Surgery | Admitting: Cardiothoracic Surgery

## 2015-01-17 VITALS — BP 113/66 | HR 68 | Temp 98.2°F | Resp 18 | Ht 59.0 in | Wt 150.0 lb

## 2015-01-17 DIAGNOSIS — J8410 Pulmonary fibrosis, unspecified: Secondary | ICD-10-CM | POA: Diagnosis not present

## 2015-01-17 DIAGNOSIS — Z79899 Other long term (current) drug therapy: Secondary | ICD-10-CM | POA: Insufficient documentation

## 2015-01-17 DIAGNOSIS — I2510 Atherosclerotic heart disease of native coronary artery without angina pectoris: Secondary | ICD-10-CM | POA: Insufficient documentation

## 2015-01-17 DIAGNOSIS — Z955 Presence of coronary angioplasty implant and graft: Secondary | ICD-10-CM | POA: Insufficient documentation

## 2015-01-17 DIAGNOSIS — R569 Unspecified convulsions: Secondary | ICD-10-CM | POA: Insufficient documentation

## 2015-01-17 DIAGNOSIS — F329 Major depressive disorder, single episode, unspecified: Secondary | ICD-10-CM | POA: Diagnosis not present

## 2015-01-17 DIAGNOSIS — Z7982 Long term (current) use of aspirin: Secondary | ICD-10-CM | POA: Diagnosis not present

## 2015-01-17 DIAGNOSIS — Z87891 Personal history of nicotine dependence: Secondary | ICD-10-CM | POA: Diagnosis not present

## 2015-01-17 DIAGNOSIS — E039 Hypothyroidism, unspecified: Secondary | ICD-10-CM | POA: Insufficient documentation

## 2015-01-17 DIAGNOSIS — Z01812 Encounter for preprocedural laboratory examination: Secondary | ICD-10-CM | POA: Diagnosis not present

## 2015-01-17 DIAGNOSIS — F419 Anxiety disorder, unspecified: Secondary | ICD-10-CM | POA: Diagnosis not present

## 2015-01-17 DIAGNOSIS — I252 Old myocardial infarction: Secondary | ICD-10-CM | POA: Diagnosis not present

## 2015-01-17 DIAGNOSIS — E785 Hyperlipidemia, unspecified: Secondary | ICD-10-CM | POA: Insufficient documentation

## 2015-01-17 DIAGNOSIS — Z01818 Encounter for other preprocedural examination: Secondary | ICD-10-CM | POA: Diagnosis not present

## 2015-01-17 DIAGNOSIS — D509 Iron deficiency anemia, unspecified: Secondary | ICD-10-CM | POA: Insufficient documentation

## 2015-01-17 DIAGNOSIS — K219 Gastro-esophageal reflux disease without esophagitis: Secondary | ICD-10-CM | POA: Diagnosis not present

## 2015-01-17 DIAGNOSIS — I10 Essential (primary) hypertension: Secondary | ICD-10-CM | POA: Insufficient documentation

## 2015-01-17 HISTORY — DX: Pulmonary fibrosis, unspecified: J84.10

## 2015-01-17 HISTORY — DX: Personal history of urinary calculi: Z87.442

## 2015-01-17 LAB — URINE MICROSCOPIC-ADD ON

## 2015-01-17 LAB — COMPREHENSIVE METABOLIC PANEL
ALT: 10 U/L — ABNORMAL LOW (ref 14–54)
AST: 19 U/L (ref 15–41)
Albumin: 3.5 g/dL (ref 3.5–5.0)
Alkaline Phosphatase: 100 U/L (ref 38–126)
Anion gap: 9 (ref 5–15)
BUN: 17 mg/dL (ref 6–20)
CO2: 25 mmol/L (ref 22–32)
Calcium: 8.2 mg/dL — ABNORMAL LOW (ref 8.9–10.3)
Chloride: 109 mmol/L (ref 101–111)
Creatinine, Ser: 1.13 mg/dL — ABNORMAL HIGH (ref 0.44–1.00)
GFR calc Af Amer: 57 mL/min — ABNORMAL LOW (ref >60)
GFR calc non Af Amer: 49 mL/min — ABNORMAL LOW (ref >60)
Glucose, Bld: 105 mg/dL — ABNORMAL HIGH (ref 65–99)
Potassium: 3.9 mmol/L (ref 3.5–5.1)
Sodium: 143 mmol/L (ref 135–145)
Total Bilirubin: 0.2 mg/dL — ABNORMAL LOW (ref 0.3–1.2)
Total Protein: 6.9 g/dL (ref 6.5–8.1)

## 2015-01-17 LAB — CBC
HCT: 32.2 % — ABNORMAL LOW (ref 36.0–46.0)
Hemoglobin: 10.1 g/dL — ABNORMAL LOW (ref 12.0–15.0)
MCH: 27.2 pg (ref 26.0–34.0)
MCHC: 31.4 g/dL (ref 30.0–36.0)
MCV: 86.8 fL (ref 78.0–100.0)
Platelets: 285 10*3/uL (ref 150–400)
RBC: 3.71 MIL/uL — ABNORMAL LOW (ref 3.87–5.11)
RDW: 13.7 % (ref 11.5–15.5)
WBC: 6.5 10*3/uL (ref 4.0–10.5)

## 2015-01-17 LAB — PROTIME-INR
INR: 1.17 (ref 0.00–1.49)
Prothrombin Time: 15.1 seconds (ref 11.6–15.2)

## 2015-01-17 LAB — TYPE AND SCREEN
ABO/RH(D): O POS
Antibody Screen: NEGATIVE

## 2015-01-17 LAB — URINALYSIS, ROUTINE W REFLEX MICROSCOPIC
Bilirubin Urine: NEGATIVE
Glucose, UA: NEGATIVE mg/dL
Ketones, ur: NEGATIVE mg/dL
Nitrite: NEGATIVE
Protein, ur: NEGATIVE mg/dL
Specific Gravity, Urine: 1.026 (ref 1.005–1.030)
pH: 6 (ref 5.0–8.0)

## 2015-01-17 LAB — APTT: aPTT: 31 seconds (ref 24–37)

## 2015-01-17 LAB — SURGICAL PCR SCREEN
MRSA, PCR: NEGATIVE
Staphylococcus aureus: NEGATIVE

## 2015-01-17 NOTE — Pre-Procedure Instructions (Addendum)
[name]  01/17/2015      [id number] - [place] [place] [address] Phone: [phone] Fax: [phone]    Your procedure is scheduled on 01/24/15  Report to [facility]mitting at 530 A.M.  Call this number if you have problems the morning of surgery:  [phone]   Remember:  Do not eat food or drink liquids after midnight.  Take these medicines the morning of surgery with A SIP OF WATER diltiaxem, cardura, isosorbide, levothyroxine, keppra, metoprolol,paxil, protonix,pain med if needed   STOP all herbel meds, nsaids (aleve,naproxen,advil,ibuprofen) 5 days prior to surgery starting 01/19/15 including vitamins                 , aspirin,plavix per dr   Lazaro Arms not wear jewelry, make-up or nail polish.  Do not wear lotions, powders, or perfumes.  You may wear deodorant.  Do not shave 48 hours prior to surgery.  Men may shave face and neck.  Do not bring valuables to the hospital.  [facility] is not responsible for any belongings or valuables.  Contacts, dentures or bridgework may not be worn into surgery.  Leave your suitcase in the car.  After surgery it may be brought to your room.  For patients admitted to the hospital, discharge time will be determined by your treatment team.  Patients discharged the day of surgery will not be allowed to drive home.   Name and phone number of your driver:   Special instructions:  Special Instructions: [place] - Preparing for Surgery  Before surgery, you can play an important role.  Because skin is not sterile, your skin needs to be as free of germs as possible.  You can reduce the number of germs on you skin by washing with CHG (chlorahexidine gluconate) soap before surgery.  CHG is an antiseptic cleaner which kills germs and bonds with the skin to continue killing germs even after washing.  Please DO NOT use if you have an allergy to CHG or antibacterial soaps.  If your skin  becomes reddened/irritated stop using the CHG and inform your nurse when you arrive at Short Stay.  Do not shave (including legs and underarms) for at least 48 hours prior to the first CHG shower.  You may shave your face.  Please follow these instructions carefully:   1.  Shower with CHG Soap the night before surgery and the morning of Surgery.  2.  If you choose to wash your hair, wash your hair first as usual with your normal shampoo.  3.  After you shampoo, rinse your hair and body thoroughly to remove the Shampoo.  4.  Use CHG as you would any other liquid soap.  You can apply chg directly  to the skin and wash gently with scrungie or a clean washcloth.  5.  Apply the CHG Soap to your body ONLY FROM THE NECK DOWN.  Do not use on open wounds or open sores.  Avoid contact with your eyes ears, mouth and genitals (private parts).  Wash genitals (private parts)       with your normal soap.  6.  Wash thoroughly, paying special attention to the area where your surgery will be performed.  7.  Thoroughly rinse your body with warm water from the neck down.  8.  DO NOT shower/wash with your normal soap after using and rinsing off the CHG Soap.  9.  Pat yourself  dry with a clean towel.            10.  Wear clean pajamas.            11.  Place clean sheets on your bed the night of your first shower and do not sleep with pets.  Day of Surgery  Do not apply any lotions/deodorants the morning of surgery.  Please wear clean clothes to the hospital/surgery center.  Please read over the following fact sheets that you were given. Pain Booklet, Coughing and Deep Breathing, Blood Transfusion Information, MRSA Information and Surgical Site Infection Prevention

## 2015-01-17 NOTE — Research (Unsigned)
BRAVE-1: Bronchial Sample Collection for a Novel Genomic Test  This patient, [name], has voluntarily consented to participate in the above clinical trial according to FDA regulations, GCP guidelines and [employer] SOPs. This patient was given sufficient time for reading the consent and asking questions. Clinical trial was dicussed with this patient and spouse, Fritz Pickerel, by this RN.  All questions were answered. All risks, benefits and options have been thoroughly discussed. The patient demonstrated understanding of the clinical trial.  This patient was not coerced in any way to participate in this clinical trial. The patient has signed voluntarily at 10:20am on January 16, 2014. This patient was given a copy of this consent. Principal Investigator, Brand Males, MD also reviewed clinical trial with patient and spouse.  Doreatha Martin, RN [phone]

## 2015-01-17 NOTE — Progress Notes (Signed)
Anesthesia Chart Review: Patient is a 70 year old female scheduled for bronchoscopy, right VATS, lung biopsy on 01/24/15 (moved from 01/21/15) by Dr. Prescott Gum.  History includes former smoker, CAD/MI s/p DES distal RCA 06/26/10 and DES mid LAD 04/03/12, murmur, hypothyroidism, IBS, HTN, HLD, seizures, iron deficiency anemia, esophageal stricture s/p dilatation, dysphagia, GERD, hiatal hernia, depression, anxiety, post-operative N/V, pulmonary fibrosis (home O2 at night),median sternotomy for resection of mediastinal mass (benign pericardial cyst) 02/05/13, right ureter stent 02/13/14. BMI is 30.   PCP is Dr. Carol Ada. Neurologist is Dr. Jannifer Franklin. Pulmonologist is Dr. Chase Caller. Cardiologist is Dr. Fransico Him, last visit 11/20/14 with one year follow-up recommended.   Meds include Xanax, ASA 81mg , Plavix, diltiazem, Lasix, Norco, Imdur, Keppra, levothyroxine, losartan, Toprol XL, Nitro, Protonix, Paxil, KCl, Crestor. She reported being instructed to hold Plavix on 01/19/15.  01/17/15 EKG: SB at 57 bpm, LAD, possible anterior infarct (age undetermined).  10/29/14 Nuclear stress test (done following admission for chest pain):  Nuclear stress EF: 86%.  There was no ST segment deviation noted during stress.  The study is normal.  This is a low risk study.  The left ventricular ejection fraction is hyperdynamic (>65%). Normal pharmacologic stress test with no infarct and no ischemia.   10/15/14 Echo: Study Conclusions - Left ventricle: The cavity size was normal. There was mild concentric hypertrophy. Systolic function was normal. The estimated ejection fraction was in the range of 55% to 60%. Wall motion was normal; there were no regional wall motion abnormalities. Doppler parameters are consistent with abnormal left ventricular relaxation (grade 1 diastolic dysfunction). - Aortic valve: Trileaflet; mildly thickened, moderately calcified leaflets. Valve mobility was mildly  restricted. - Mitral valve: Moderately calcified annulus. There was mild regurgitation. - Pericardium, extracardiac: A trivial pericardial effusion was identified posterior to the heart. Impressions: - Compared to the prior study, there has been no significant interval change.  Coronary angiogram on 03/14/12 showed: Normal left main coronary artery, 50% proximal and 70% mid stenosis of the LAD and distally at takeoff of second diagonal there is a 50-70% stenosis in a very tortuous LAD, 70-80% ostial stenosis of the left circumflex in a small vessel, and 50% instent restenosis of the mid to distal portion of the RCA stent and 50-70% stenosis of the ostial PDA. Normal LV systolic function, EF [id number]. She underwent successful PCI of the mid LAD with a Promus stent on 04/03/12.  12/04/14 High resolution Chest CT: IMPRESSION: 1. Faint coalescent ground-glass centrilobular nodularity and minimal scattered subpleural reticulation throughout both lungs. Severe patchy air trapping throughout both lungs. This combination of findings is strongly suggestive of subacute on early chronic hypersensitivity pneumonitis. 2. Mild subcarinal mediastinal adenopathy, nonspecific, likely reactive.  12/221/6 PFTs: FVC 1.50 (61%), FEV1 1.20 (64%), DLCOunc (10.44 (59%).   She is for a CXR on the day of surgery.  Preoperative labs noted. Cr 1.13. Glucose 105. H/H 10.1/32.2 (HGB 9-11 since 02/2014). PT/PTT WNL. T&S done. ABG needs to be redrawn on the day of surgery.    If no acute changes then I anticipate that she can proceed as planned.  George [facility]/Anesthesiology Phone [phone] 01/17/2015 5:28 PM

## 2015-01-17 NOTE — Progress Notes (Unsigned)
Patient ID: [name], female   DOB: [birth year], 70 y.o.   MRN: [id number]  CONSENTING FOR BRAVE STUDY   Met patient earlier today at [facility]. She was with her husband. She completed standard of care preop eval for surgical lung biopsy for ILD eval scheduled for next week. She was interested in hearing about BRAVE study. She was discussed about this by research coordinator research nurse Doreatha Martin. I subsequently joined her, husband and Ms. [name].   Explained rationale for Brave study - to make ILD dx using less invasive bronch, BAL, tbbx than surgical lung bx.   Explained potential risks - Risks of pneumothorax, hemothorax, sedation/anesthesia complications such as cardiac or respiratory arrest or hypotension, stroke and bleeding all explained. Ris of excess anesthesia time by 15-30 min explained. Overall low risk beause of inherent low risk for bronch and patient undergoing SOC surgical lung biospy which is of higher risk. Patient verbalized understanding and wished to proceed.    Explained potential benefits - none immediately to her but of value for science and medical progress  Other issues discussed and all questions answered satisfactorily   1. Scientific Purpose  Clinical research is designed to produce generalizable knowledge and to answer questions about the safety and efficacy of intervention(s) under study in order to determine whether or not they may be useful for the care of future patients.  2. Study Procedures  Participation in a trial may involve procedures or tests, in addition to the intervention(s) under study, that are intended only or primarily to generate scientific knowledge and that are otherwise not necessary for patient care. Participation is voluntary   3. Uncertainty  For intervention(s) under study in clinical research, there often is less knowledge and more uncertainty about the risks and benefits to a population of trial participants than  there is when a doctor offers a patient standard interventions.   4. Adherence to Protocol  Administration of the intervention(s) under study is typically based on a strict protocol with defined dose, scheduling, and use or avoidance of concurrent medications, compared to administration of standard interventions.  5. Clinician as Investigator  Clinicians who are in health care settings provide treatment; in a clinical trial setting, they are also investigating safety and efficacy of an intervention. In otherwise your doctor or nurse practitioner can be wearing 2 hats - one as care giver another as Company secretary  6. Patient as Visual merchandiser Subject  Patients participating in research trials are research subjects or volunteers. In other words participating in research is 100% voluntary and at one's own free weill. The decision to participate or not participate will NOT affect patient care and the doctor-patient relationship in any way  7. Financial Conflict of Interest Disclosure  Your referring physician(s) for the research trial has an investment interest in [employer], [facility] the clinical trials site and is both the company and the investigators are being compensated for their effort in trial research activities.     Dr. Brand Males, M.D., [facility].C.P Pulmonary and Critical Care Medicine Staff Physician [place] [facility] Pager: [phone], If no answer or between  15:00h - 7:00h: call [phone]  01/17/2015 5:30 PM

## 2015-01-24 ENCOUNTER — Inpatient Hospital Stay (HOSPITAL_COMMUNITY): Payer: PPO | Admitting: Certified Registered"

## 2015-01-24 ENCOUNTER — Encounter (HOSPITAL_COMMUNITY)
Admission: RE | Disposition: A | Discharge status: Home / Self Care | Payer: Self-pay | Source: Ambulatory Visit | Attending: Cardiothoracic Surgery

## 2015-01-24 ENCOUNTER — Inpatient Hospital Stay (HOSPITAL_COMMUNITY): Payer: PPO

## 2015-01-24 ENCOUNTER — Inpatient Hospital Stay (HOSPITAL_COMMUNITY)
Admission: RE | Admit: 2015-01-24 | Discharge: 2015-01-28 | DRG: 165 | Disposition: A | Discharge status: Home / Self Care | Payer: PPO | Source: Ambulatory Visit | Attending: Cardiothoracic Surgery | Admitting: Cardiothoracic Surgery

## 2015-01-24 ENCOUNTER — Inpatient Hospital Stay (HOSPITAL_COMMUNITY): Payer: PPO | Admitting: Vascular Surgery

## 2015-01-24 ENCOUNTER — Encounter (HOSPITAL_COMMUNITY): Payer: Self-pay | Admitting: Certified Registered"

## 2015-01-24 DIAGNOSIS — R0602 Shortness of breath: Secondary | ICD-10-CM

## 2015-01-24 DIAGNOSIS — I10 Essential (primary) hypertension: Secondary | ICD-10-CM | POA: Diagnosis not present

## 2015-01-24 DIAGNOSIS — Z9889 Other specified postprocedural states: Secondary | ICD-10-CM

## 2015-01-24 DIAGNOSIS — E039 Hypothyroidism, unspecified: Secondary | ICD-10-CM | POA: Diagnosis not present

## 2015-01-24 DIAGNOSIS — K589 Irritable bowel syndrome without diarrhea: Secondary | ICD-10-CM | POA: Diagnosis present

## 2015-01-24 DIAGNOSIS — M109 Gout, unspecified: Secondary | ICD-10-CM | POA: Diagnosis not present

## 2015-01-24 DIAGNOSIS — Z87891 Personal history of nicotine dependence: Secondary | ICD-10-CM | POA: Diagnosis not present

## 2015-01-24 DIAGNOSIS — Z955 Presence of coronary angioplasty implant and graft: Secondary | ICD-10-CM | POA: Diagnosis not present

## 2015-01-24 DIAGNOSIS — J8410 Pulmonary fibrosis, unspecified: Secondary | ICD-10-CM

## 2015-01-24 DIAGNOSIS — F419 Anxiety disorder, unspecified: Secondary | ICD-10-CM | POA: Diagnosis not present

## 2015-01-24 DIAGNOSIS — J939 Pneumothorax, unspecified: Secondary | ICD-10-CM

## 2015-01-24 DIAGNOSIS — K219 Gastro-esophageal reflux disease without esophagitis: Secondary | ICD-10-CM | POA: Diagnosis present

## 2015-01-24 DIAGNOSIS — M545 Low back pain: Secondary | ICD-10-CM | POA: Diagnosis not present

## 2015-01-24 DIAGNOSIS — J849 Interstitial pulmonary disease, unspecified: Secondary | ICD-10-CM | POA: Diagnosis not present

## 2015-01-24 DIAGNOSIS — Z4682 Encounter for fitting and adjustment of non-vascular catheter: Secondary | ICD-10-CM | POA: Diagnosis not present

## 2015-01-24 DIAGNOSIS — Z9981 Dependence on supplemental oxygen: Secondary | ICD-10-CM

## 2015-01-24 DIAGNOSIS — F329 Major depressive disorder, single episode, unspecified: Secondary | ICD-10-CM | POA: Diagnosis not present

## 2015-01-24 DIAGNOSIS — I252 Old myocardial infarction: Secondary | ICD-10-CM

## 2015-01-24 HISTORY — PX: LUNG BIOPSY: SHX5088

## 2015-01-24 HISTORY — PX: VIDEO BRONCHOSCOPY: SHX5072

## 2015-01-24 HISTORY — PX: THORACOTOMY: SHX5074

## 2015-01-24 HISTORY — PX: CARDIAC CATHETERIZATION: SHX172

## 2015-01-24 LAB — POCT I-STAT 3, ART BLOOD GAS (G3+)
Acid-base deficit: 1 mmol/L (ref 0.0–2.0)
Bicarbonate: 26.6 mEq/L — ABNORMAL HIGH (ref 20.0–24.0)
O2 Saturation: 93 %
Patient temperature: 97.5
TCO2: 28 mmol/L (ref 0–100)
pCO2 arterial: 56.7 mmHg — ABNORMAL HIGH (ref 35.0–45.0)
pH, Arterial: 7.276 — ABNORMAL LOW (ref 7.350–7.450)
pO2, Arterial: 73 mmHg — ABNORMAL LOW (ref 80.0–100.0)

## 2015-01-24 LAB — POCT I-STAT 7, (LYTES, BLD GAS, ICA,H+H)
Bicarbonate: 25.7 mEq/L — ABNORMAL HIGH (ref 20.0–24.0)
Calcium, Ion: 1.07 mmol/L — ABNORMAL LOW (ref 1.13–1.30)
HCT: 26 % — ABNORMAL LOW (ref 36.0–46.0)
Hemoglobin: 8.8 g/dL — ABNORMAL LOW (ref 12.0–15.0)
O2 Saturation: 100 %
Patient temperature: 36
Potassium: 3.4 mmol/L — ABNORMAL LOW (ref 3.5–5.1)
Sodium: 142 mmol/L (ref 135–145)
TCO2: 27 mmol/L (ref 0–100)
pCO2 arterial: 45.2 mmHg — ABNORMAL HIGH (ref 35.0–45.0)
pH, Arterial: 7.358 (ref 7.350–7.450)
pO2, Arterial: 277 mmHg — ABNORMAL HIGH (ref 80.0–100.0)

## 2015-01-24 LAB — GLUCOSE, CAPILLARY: Glucose-Capillary: 128 mg/dL — ABNORMAL HIGH (ref 65–99)

## 2015-01-24 SURGERY — BIOPSY, LUNG
Anesthesia: General | Site: Leg Upper | Laterality: Right

## 2015-01-24 MED ORDER — LEVETIRACETAM 500 MG PO TABS
500.0000 mg | ORAL_TABLET | Freq: Two times a day (BID) | ORAL | Status: DC
  Administered 2015-01-24 – 2015-01-28 (×8): 500 mg via ORAL
  Filled 2015-01-24: qty 1
  Filled 2015-01-24 (×3): qty 2
  Filled 2015-01-24: qty 1
  Filled 2015-01-24: qty 2
  Filled 2015-01-24 (×2): qty 1

## 2015-01-24 MED ORDER — METOPROLOL SUCCINATE ER 25 MG PO TB24
25.0000 mg | ORAL_TABLET | Freq: Every day | ORAL | Status: DC
Start: 2015-01-25 — End: 2015-01-28
  Administered 2015-01-25 – 2015-01-28 (×4): 25 mg via ORAL
  Filled 2015-01-24 (×4): qty 1

## 2015-01-24 MED ORDER — FENTANYL 40 MCG/ML IV SOLN
INTRAVENOUS | Status: AC
Start: 2015-01-24 — End: 2015-01-25
  Filled 2015-01-24: qty 25

## 2015-01-24 MED ORDER — PROMETHAZINE HCL 25 MG/ML IJ SOLN: 6.2500 mg | INTRAMUSCULAR | Status: DC | PRN

## 2015-01-24 MED ORDER — SUGAMMADEX SODIUM 200 MG/2ML IV SOLN
INTRAVENOUS | Status: DC | PRN
  Administered 2015-01-24 (×2): 200 mg via INTRAVENOUS

## 2015-01-24 MED ORDER — PROPOFOL 10 MG/ML IV BOLUS
INTRAVENOUS | Status: AC
  Filled 2015-01-24: qty 20

## 2015-01-24 MED ORDER — ATROPINE SULFATE 1 MG/ML IJ SOLN
INTRAMUSCULAR | Status: DC | PRN
  Administered 2015-01-24: .5 mg via INTRAVENOUS

## 2015-01-24 MED ORDER — LIDOCAINE HCL (CARDIAC) 20 MG/ML IV SOLN
INTRAVENOUS | Status: DC | PRN
  Administered 2015-01-24: 60 mg via INTRAVENOUS

## 2015-01-24 MED ORDER — FUROSEMIDE 10 MG/ML IJ SOLN
40.0000 mg | Freq: Once | INTRAMUSCULAR | Status: AC
  Administered 2015-01-24: 40 mg via INTRAVENOUS
  Filled 2015-01-24: qty 4

## 2015-01-24 MED ORDER — MIDAZOLAM HCL 2 MG/2ML IJ SOLN
INTRAMUSCULAR | Status: AC
  Filled 2015-01-24: qty 2

## 2015-01-24 MED ORDER — HEMOSTATIC AGENTS (NO CHARGE) OPTIME
TOPICAL | Status: DC | PRN
  Administered 2015-01-24: 1 via TOPICAL

## 2015-01-24 MED ORDER — DEXTROSE 5 % IV SOLN
1.5000 g | Freq: Two times a day (BID) | INTRAVENOUS | Status: AC
  Administered 2015-01-24 – 2015-01-25 (×2): 1.5 g via INTRAVENOUS
  Filled 2015-01-24 (×2): qty 1.5

## 2015-01-24 MED ORDER — ALPRAZOLAM 0.5 MG PO TABS
0.5000 mg | ORAL_TABLET | Freq: Every day | ORAL | Status: DC
  Administered 2015-01-25 – 2015-01-27 (×3): 0.5 mg via ORAL
  Filled 2015-01-24 (×3): qty 1

## 2015-01-24 MED ORDER — LIDOCAINE HCL (CARDIAC) 20 MG/ML IV SOLN
INTRAVENOUS | Status: AC
  Filled 2015-01-24: qty 5

## 2015-01-24 MED ORDER — STERILE WATER FOR INJECTION IJ SOLN
INTRAMUSCULAR | Status: AC
  Filled 2015-01-24: qty 10

## 2015-01-24 MED ORDER — SUFENTANIL CITRATE 50 MCG/ML IV SOLN
INTRAVENOUS | Status: DC | PRN
  Administered 2015-01-24 (×2): 10 ug via INTRAVENOUS
  Administered 2015-01-24: 5 ug via INTRAVENOUS
  Administered 2015-01-24: 25 ug via INTRAVENOUS

## 2015-01-24 MED ORDER — HYDROMORPHONE HCL 1 MG/ML IJ SOLN
INTRAMUSCULAR | Status: AC
  Filled 2015-01-24: qty 1

## 2015-01-24 MED ORDER — 0.9 % SODIUM CHLORIDE (POUR BTL) OPTIME
TOPICAL | Status: DC | PRN
  Administered 2015-01-24: 3000 mL
  Administered 2015-01-24: 1000 mL

## 2015-01-24 MED ORDER — HYDROMORPHONE HCL 1 MG/ML IJ SOLN
0.2500 mg | INTRAMUSCULAR | Status: DC | PRN
  Administered 2015-01-24: 1 mg via INTRAVENOUS
  Administered 2015-01-24: 0.5 mg via INTRAVENOUS

## 2015-01-24 MED ORDER — CLOPIDOGREL BISULFATE 75 MG PO TABS
75.0000 mg | ORAL_TABLET | Freq: Every day | ORAL | Status: DC
  Administered 2015-01-26 – 2015-01-28 (×3): 75 mg via ORAL
  Filled 2015-01-24 (×3): qty 1

## 2015-01-24 MED ORDER — ROCURONIUM BROMIDE 50 MG/5ML IV SOLN
INTRAVENOUS | Status: AC
  Filled 2015-01-24: qty 2

## 2015-01-24 MED ORDER — PROPOFOL 10 MG/ML IV BOLUS
INTRAVENOUS | Status: DC | PRN
  Administered 2015-01-24: 150 mg via INTRAVENOUS

## 2015-01-24 MED ORDER — LEVALBUTEROL HCL 1.25 MG/0.5ML IN NEBU
1.2500 mg | INHALATION_SOLUTION | Freq: Four times a day (QID) | RESPIRATORY_TRACT | Status: DC
  Administered 2015-01-24 – 2015-01-26 (×6): 1.25 mg via RESPIRATORY_TRACT
  Filled 2015-01-24 (×6): qty 0.5

## 2015-01-24 MED ORDER — ISOSORBIDE MONONITRATE ER 60 MG PO TB24
90.0000 mg | ORAL_TABLET | Freq: Every day | ORAL | Status: DC
  Administered 2015-01-25 – 2015-01-28 (×4): 90 mg via ORAL
  Filled 2015-01-24: qty 1
  Filled 2015-01-24: qty 3
  Filled 2015-01-24: qty 1
  Filled 2015-01-24: qty 3

## 2015-01-24 MED ORDER — SUGAMMADEX SODIUM 500 MG/5ML IV SOLN
INTRAVENOUS | Status: AC
  Filled 2015-01-24: qty 5

## 2015-01-24 MED ORDER — DOXAZOSIN MESYLATE 2 MG PO TABS
2.0000 mg | ORAL_TABLET | Freq: Every day | ORAL | Status: DC
Start: 2015-01-25 — End: 2015-01-28
  Administered 2015-01-25 – 2015-01-28 (×4): 2 mg via ORAL
  Filled 2015-01-24 (×4): qty 1

## 2015-01-24 MED ORDER — CEFUROXIME SODIUM 1.5 G IJ SOLR
1.5000 g | INTRAMUSCULAR | Status: AC
  Administered 2015-01-24: 1.5 g via INTRAVENOUS
  Filled 2015-01-24: qty 1.5

## 2015-01-24 MED ORDER — ROSUVASTATIN CALCIUM 40 MG PO TABS
20.0000 mg | ORAL_TABLET | Freq: Every day | ORAL | Status: DC
  Administered 2015-01-25 – 2015-01-27 (×3): 20 mg via ORAL
  Filled 2015-01-24 (×3): qty 1

## 2015-01-24 MED ORDER — SUGAMMADEX SODIUM 200 MG/2ML IV SOLN
INTRAVENOUS | Status: AC
  Filled 2015-01-24: qty 2

## 2015-01-24 MED ORDER — MIDAZOLAM HCL 5 MG/5ML IJ SOLN
INTRAMUSCULAR | Status: DC | PRN
  Administered 2015-01-24: 2 mg via INTRAVENOUS

## 2015-01-24 MED ORDER — DEXAMETHASONE SODIUM PHOSPHATE 4 MG/ML IJ SOLN
INTRAMUSCULAR | Status: DC | PRN
  Administered 2015-01-24: 4 mg via INTRAVENOUS

## 2015-01-24 MED ORDER — OXYCODONE HCL 5 MG/5ML PO SOLN
ORAL | Status: AC
  Administered 2015-01-24: 10 mg
  Filled 2015-01-24: qty 5

## 2015-01-24 MED ORDER — SENNOSIDES-DOCUSATE SODIUM 8.6-50 MG PO TABS
1.0000 | ORAL_TABLET | Freq: Every day | ORAL | Status: DC
  Administered 2015-01-24 – 2015-01-27 (×4): 1 via ORAL
  Filled 2015-01-24 (×4): qty 1

## 2015-01-24 MED ORDER — PHENYLEPHRINE HCL 10 MG/ML IJ SOLN
INTRAMUSCULAR | Status: DC | PRN
  Administered 2015-01-24 (×9): 80 ug via INTRAVENOUS
  Administered 2015-01-24: 160 ug via INTRAVENOUS
  Administered 2015-01-24: 80 ug via INTRAVENOUS

## 2015-01-24 MED ORDER — SODIUM CHLORIDE 0.9 % IJ SOLN
INTRAMUSCULAR | Status: AC
  Filled 2015-01-24: qty 10

## 2015-01-24 MED ORDER — ASPIRIN EC 81 MG PO TBEC
81.0000 mg | DELAYED_RELEASE_TABLET | Freq: Every day | ORAL | Status: DC
  Administered 2015-01-26 – 2015-01-28 (×3): 81 mg via ORAL
  Filled 2015-01-24 (×3): qty 1

## 2015-01-24 MED ORDER — POTASSIUM CHLORIDE 10 MEQ/50ML IV SOLN
10.0000 meq | Freq: Every day | INTRAVENOUS | Status: DC | PRN
  Filled 2015-01-24 (×2): qty 50

## 2015-01-24 MED ORDER — ROCURONIUM BROMIDE 100 MG/10ML IV SOLN
INTRAVENOUS | Status: DC | PRN
  Administered 2015-01-24: 40 mg via INTRAVENOUS

## 2015-01-24 MED ORDER — LACTATED RINGERS IV SOLN
INTRAVENOUS | Status: DC | PRN
  Administered 2015-01-24: 11:00:00 via INTRAVENOUS

## 2015-01-24 MED ORDER — ONDANSETRON HCL 4 MG/2ML IJ SOLN
INTRAMUSCULAR | Status: DC | PRN
  Administered 2015-01-24: 4 mg via INTRAVENOUS

## 2015-01-24 MED ORDER — PHENYLEPHRINE 40 MCG/ML (10ML) SYRINGE FOR IV PUSH (FOR BLOOD PRESSURE SUPPORT)
PREFILLED_SYRINGE | INTRAVENOUS | Status: AC
  Filled 2015-01-24: qty 20

## 2015-01-24 MED ORDER — PANTOPRAZOLE SODIUM 40 MG PO TBEC
40.0000 mg | DELAYED_RELEASE_TABLET | Freq: Two times a day (BID) | ORAL | Status: DC
  Administered 2015-01-25 – 2015-01-28 (×7): 40 mg via ORAL
  Filled 2015-01-24 (×7): qty 1

## 2015-01-24 MED ORDER — ONDANSETRON HCL 4 MG/2ML IJ SOLN
INTRAMUSCULAR | Status: AC
  Filled 2015-01-24: qty 2

## 2015-01-24 MED ORDER — INSULIN ASPART 100 UNIT/ML ~~LOC~~ SOLN
0.0000 [IU] | Freq: Four times a day (QID) | SUBCUTANEOUS | Status: DC
  Administered 2015-01-24 – 2015-01-25 (×3): 2 [IU] via SUBCUTANEOUS
  Administered 2015-01-25: 4 [IU] via SUBCUTANEOUS

## 2015-01-24 MED ORDER — PAROXETINE HCL 20 MG PO TABS
30.0000 mg | ORAL_TABLET | Freq: Every day | ORAL | Status: DC
  Administered 2015-01-25 – 2015-01-28 (×4): 30 mg via ORAL
  Filled 2015-01-24 (×2): qty 2
  Filled 2015-01-24 (×2): qty 1

## 2015-01-24 MED ORDER — DIPHENHYDRAMINE HCL 12.5 MG/5ML PO ELIX: 12.5000 mg | ORAL_SOLUTION | Freq: Four times a day (QID) | ORAL | Status: DC | PRN

## 2015-01-24 MED ORDER — HEPARIN SOD (PORK) LOCK FLUSH 100 UNIT/ML IV SOLN
INTRAVENOUS | Status: AC
  Filled 2015-01-24: qty 5

## 2015-01-24 MED ORDER — ACETAMINOPHEN 160 MG/5ML PO SOLN: 1000.0000 mg | Freq: Four times a day (QID) | ORAL | Status: DC

## 2015-01-24 MED ORDER — LEVOTHYROXINE SODIUM 112 MCG PO TABS
112.0000 ug | ORAL_TABLET | Freq: Every day | ORAL | Status: DC
  Administered 2015-01-25 – 2015-01-28 (×4): 112 ug via ORAL
  Filled 2015-01-24 (×4): qty 1

## 2015-01-24 MED ORDER — DEXAMETHASONE SODIUM PHOSPHATE 4 MG/ML IJ SOLN
INTRAMUSCULAR | Status: AC
  Filled 2015-01-24: qty 1

## 2015-01-24 MED ORDER — KCL IN DEXTROSE-NACL 20-5-0.45 MEQ/L-%-% IV SOLN
INTRAVENOUS | Status: DC
  Administered 2015-01-24 – 2015-01-25 (×2): via INTRAVENOUS
  Filled 2015-01-24 (×3): qty 1000

## 2015-01-24 MED ORDER — EPHEDRINE SULFATE 50 MG/ML IJ SOLN
INTRAMUSCULAR | Status: DC | PRN
  Administered 2015-01-24 (×3): 10 mg via INTRAVENOUS
  Administered 2015-01-24: 5 mg via INTRAVENOUS

## 2015-01-24 MED ORDER — CALCITRIOL 0.25 MCG PO CAPS
0.2500 ug | ORAL_CAPSULE | Freq: Every day | ORAL | Status: DC
  Administered 2015-01-25 – 2015-01-28 (×4): 0.25 ug via ORAL
  Filled 2015-01-24 (×4): qty 1

## 2015-01-24 MED ORDER — ROCURONIUM BROMIDE 50 MG/5ML IV SOLN
INTRAVENOUS | Status: AC
  Filled 2015-01-24: qty 1

## 2015-01-24 MED ORDER — BISACODYL 5 MG PO TBEC
10.0000 mg | DELAYED_RELEASE_TABLET | Freq: Every day | ORAL | Status: DC
  Administered 2015-01-25 – 2015-01-27 (×3): 10 mg via ORAL
  Filled 2015-01-24 (×4): qty 2

## 2015-01-24 MED ORDER — NALOXONE HCL 0.4 MG/ML IJ SOLN
0.4000 mg | INTRAMUSCULAR | Status: DC | PRN
  Administered 2015-01-24: 0.4 mg via INTRAVENOUS
  Filled 2015-01-24: qty 1

## 2015-01-24 MED ORDER — SODIUM CHLORIDE 0.9 % IV SOLN
INTRAVENOUS | Status: DC | PRN
  Administered 2015-01-24: 500 mL

## 2015-01-24 MED ORDER — SODIUM CHLORIDE 0.9 % IJ SOLN
9.0000 mL | INTRAMUSCULAR | Status: DC | PRN
  Administered 2015-01-24: 9 mL via INTRAVENOUS
  Filled 2015-01-24: qty 9

## 2015-01-24 MED ORDER — SUFENTANIL CITRATE 50 MCG/ML IV SOLN
INTRAVENOUS | Status: AC
  Filled 2015-01-24: qty 1

## 2015-01-24 MED ORDER — OXYCODONE HCL 5 MG PO TABS: 5.0000 mg | ORAL_TABLET | ORAL | Status: DC | PRN

## 2015-01-24 MED ORDER — DIPHENHYDRAMINE HCL 50 MG/ML IJ SOLN: 12.5000 mg | Freq: Four times a day (QID) | INTRAMUSCULAR | Status: DC | PRN

## 2015-01-24 MED ORDER — EPHEDRINE SULFATE 50 MG/ML IJ SOLN
INTRAMUSCULAR | Status: AC
  Filled 2015-01-24: qty 1

## 2015-01-24 MED ORDER — LACTATED RINGERS IV SOLN
INTRAVENOUS | Status: DC
  Administered 2015-01-24: 50 mL/h via INTRAVENOUS
  Administered 2015-01-24 (×2): via INTRAVENOUS

## 2015-01-24 MED ORDER — ONDANSETRON HCL 4 MG/2ML IJ SOLN: 4.0000 mg | Freq: Four times a day (QID) | INTRAMUSCULAR | Status: DC | PRN

## 2015-01-24 MED ORDER — FENTANYL 40 MCG/ML IV SOLN
INTRAVENOUS | Status: DC
  Administered 2015-01-24: 14:00:00 via INTRAVENOUS
  Administered 2015-01-25: 0 ug via INTRAVENOUS

## 2015-01-24 MED ORDER — TRAMADOL HCL 50 MG PO TABS
50.0000 mg | ORAL_TABLET | Freq: Four times a day (QID) | ORAL | Status: DC | PRN
  Administered 2015-01-25: 100 mg via ORAL
  Administered 2015-01-25: 50 mg via ORAL
  Administered 2015-01-25 – 2015-01-26 (×2): 100 mg via ORAL
  Administered 2015-01-27: 50 mg via ORAL
  Filled 2015-01-24: qty 2
  Filled 2015-01-24: qty 1
  Filled 2015-01-24 (×2): qty 2
  Filled 2015-01-24: qty 1

## 2015-01-24 MED ORDER — ACETAMINOPHEN 500 MG PO TABS
1000.0000 mg | ORAL_TABLET | Freq: Four times a day (QID) | ORAL | Status: DC
  Administered 2015-01-24 – 2015-01-28 (×10): 1000 mg via ORAL
  Filled 2015-01-24 (×11): qty 2

## 2015-01-24 MED ORDER — ALBUTEROL SULFATE (2.5 MG/3ML) 0.083% IN NEBU
INHALATION_SOLUTION | RESPIRATORY_TRACT | Status: AC
  Filled 2015-01-24: qty 3

## 2015-01-24 SURGICAL SUPPLY — 88 items
ADH SKN CLS APL DERMABOND .7 (GAUZE/BANDAGES/DRESSINGS) ×3
APL SRG 22X2 LUM MLBL SLNT (VASCULAR PRODUCTS) ×3
APPLICATOR TIP EXT COSEAL (VASCULAR PRODUCTS) ×2 IMPLANT
BAG DECANTER FOR FLEXI CONT (MISCELLANEOUS) ×2 IMPLANT
BALL CTTN LRG ABS STRL LF (GAUZE/BANDAGES/DRESSINGS)
BLADE SURG 11 STRL SS (BLADE) ×3 IMPLANT
BRUSH CYTOL CELLEBRITY 1.5X140 (MISCELLANEOUS) ×2 IMPLANT
CANISTER SUCTION 2500CC (MISCELLANEOUS) ×5 IMPLANT
CATH KIT ON Q 5IN SLV (PAIN MANAGEMENT) IMPLANT
CATH ROBINSON RED A/P 22FR (CATHETERS) IMPLANT
CATH THORACIC 28FR (CATHETERS) ×2 IMPLANT
CATH THORACIC 36FR (CATHETERS) IMPLANT
CATH THORACIC 36FR RT ANG (CATHETERS) IMPLANT
CONT SPEC 4OZ CLIKSEAL STRL BL (MISCELLANEOUS) ×10 IMPLANT
COTTONBALL LRG STERILE PKG (GAUZE/BANDAGES/DRESSINGS) IMPLANT
COVER SURGICAL LIGHT HANDLE (MISCELLANEOUS) ×8 IMPLANT
COVER TABLE BACK 60X90 (DRAPES) ×5 IMPLANT
DERMABOND ADVANCED (GAUZE/BANDAGES/DRESSINGS) ×2
DERMABOND ADVANCED .7 DNX12 (GAUZE/BANDAGES/DRESSINGS) IMPLANT
DRAPE LAPAROSCOPIC ABDOMINAL (DRAPES) ×5 IMPLANT
DRAPE PROXIMA HALF (DRAPES) ×2 IMPLANT
DRAPE WARM FLUID 44X44 (DRAPE) ×3 IMPLANT
ELECT BLADE 4.0 EZ CLEAN MEGAD (MISCELLANEOUS) ×5
ELECT REM PT RETURN 9FT ADLT (ELECTROSURGICAL) ×5
ELECTRODE BLDE 4.0 EZ CLN MEGD (MISCELLANEOUS) IMPLANT
ELECTRODE REM PT RTRN 9FT ADLT (ELECTROSURGICAL) ×3 IMPLANT
FORCEPS BIOP RJ4 1.8 (CUTTING FORCEPS) IMPLANT
FORCEPS RADIAL JAW LRG 4 PULM (INSTRUMENTS) IMPLANT
GAUZE SPONGE 4X4 12PLY STRL (GAUZE/BANDAGES/DRESSINGS) ×7 IMPLANT
GLOVE BIO SURGEON STRL SZ7.5 (GLOVE) ×10 IMPLANT
GLOVE BIOGEL PI IND STRL 7.5 (GLOVE) IMPLANT
GLOVE BIOGEL PI INDICATOR 7.5 (GLOVE) ×2
GOWN STRL REUS W/ TWL LRG LVL3 (GOWN DISPOSABLE) ×9 IMPLANT
GOWN STRL REUS W/TWL LRG LVL3 (GOWN DISPOSABLE) ×20
KIT BASIN OR (CUSTOM PROCEDURE TRAY) ×5 IMPLANT
KIT CLEAN ENDO COMPLIANCE (KITS) ×5 IMPLANT
KIT ROOM TURNOVER OR (KITS) ×5 IMPLANT
KIT SUCTION CATH 14FR (SUCTIONS) ×5 IMPLANT
MARKER SKIN DUAL TIP RULER LAB (MISCELLANEOUS) ×5 IMPLANT
NDL BIOPSY TRANSBRONCH 21G (NEEDLE) IMPLANT
NDL BLUNT 18X1 FOR OR ONLY (NEEDLE) IMPLANT
NEEDLE BIOPSY TRANSBRONCH 21G (NEEDLE) IMPLANT
NEEDLE BLUNT 18X1 FOR OR ONLY (NEEDLE) IMPLANT
NEEDLE HYPO 22GX1.5 SAFETY (NEEDLE) IMPLANT
NS IRRIG 1000ML POUR BTL (IV SOLUTION) ×14 IMPLANT
OIL SILICONE PENTAX (PARTS (SERVICE/REPAIRS)) ×5 IMPLANT
PACK CHEST (CUSTOM PROCEDURE TRAY) ×5 IMPLANT
PAD ARMBOARD 7.5X6 YLW CONV (MISCELLANEOUS) ×10 IMPLANT
RADIAL JAW LRG 4 PULMONARY (INSTRUMENTS) ×2
RELOAD GOLD ECHELON 45 (STAPLE) ×8 IMPLANT
SEALANT SURG COSEAL 4ML (VASCULAR PRODUCTS) IMPLANT
SEALANT SURG COSEAL 8ML (VASCULAR PRODUCTS) ×2 IMPLANT
SOLUTION ANTI FOG 6CC (MISCELLANEOUS) ×7 IMPLANT
SPONGE GAUZE 4X4 12PLY STER LF (GAUZE/BANDAGES/DRESSINGS) ×2 IMPLANT
SPONGE TONSIL 1 RF SGL (DISPOSABLE) ×5 IMPLANT
SUT CHROMIC 3 0 SH 27 (SUTURE) IMPLANT
SUT ETHILON 3 0 PS 1 (SUTURE) IMPLANT
SUT PROLENE 3 0 SH DA (SUTURE) IMPLANT
SUT PROLENE 4 0 RB 1 (SUTURE) ×10
SUT PROLENE 4-0 RB1 .5 CRCL 36 (SUTURE) IMPLANT
SUT SILK  1 MH (SUTURE) ×4
SUT SILK 1 MH (SUTURE) ×6 IMPLANT
SUT SILK 2 0SH CR/8 30 (SUTURE) IMPLANT
SUT SILK 3 0SH CR/8 30 (SUTURE) IMPLANT
SUT VIC AB 1 CTX 18 (SUTURE) ×4 IMPLANT
SUT VIC AB 2 TP1 27 (SUTURE) IMPLANT
SUT VIC AB 2-0 CT2 18 VCP726D (SUTURE) ×2 IMPLANT
SUT VIC AB 2-0 CTX 36 (SUTURE) ×2 IMPLANT
SUT VIC AB 3-0 SH 18 (SUTURE) IMPLANT
SUT VIC AB 3-0 X1 27 (SUTURE) ×2 IMPLANT
SUT VICRYL 0 UR6 27IN ABS (SUTURE) IMPLANT
SUT VICRYL 2 TP 1 (SUTURE) ×2 IMPLANT
SWAB COLLECTION DEVICE MRSA (MISCELLANEOUS) IMPLANT
SYR 20ML ECCENTRIC (SYRINGE) ×15 IMPLANT
SYR 5ML LUER SLIP (SYRINGE) ×5 IMPLANT
SYR CONTROL 10ML LL (SYRINGE) IMPLANT
SYSTEM SAHARA CHEST DRAIN ATS (WOUND CARE) ×5 IMPLANT
TAPE CLOTH SURG 4X10 WHT LF (GAUZE/BANDAGES/DRESSINGS) ×2 IMPLANT
TIP APPLICATOR SPRAY EXTEND 16 (VASCULAR PRODUCTS) IMPLANT
TOWEL OR 17X24 6PK STRL BLUE (TOWEL DISPOSABLE) ×5 IMPLANT
TOWEL OR 17X26 10 PK STRL BLUE (TOWEL DISPOSABLE) ×10 IMPLANT
TRAP SPECIMEN MUCOUS 40CC (MISCELLANEOUS) ×17 IMPLANT
TRAY CATH LUMEN 1 20CM STRL (SET/KITS/TRAYS/PACK) ×2 IMPLANT
TRAY FOLEY CATH 16FRSI W/METER (SET/KITS/TRAYS/PACK) ×5 IMPLANT
TUBE ANAEROBIC SPECIMEN COL (MISCELLANEOUS) IMPLANT
TUBE CONNECTING 20'X1/4 (TUBING) ×1
TUBE CONNECTING 20X1/4 (TUBING) ×7 IMPLANT
WATER STERILE IRR 1000ML POUR (IV SOLUTION) ×8 IMPLANT

## 2015-01-24 NOTE — Anesthesia Preprocedure Evaluation (Signed)
Anesthesia Evaluation  Patient identified by MRN, date of birth, ID band Patient awake    Reviewed: Allergy & Precautions, NPO status , Patient's Chart, lab work & pertinent test results  Airway Mallampati: II  TM Distance: >3 FB Neck ROM: Full    Dental no notable dental hx.    Pulmonary neg pulmonary ROS, former smoker,    Pulmonary exam normal breath sounds clear to auscultation       Cardiovascular hypertension, + CAD, + Past MI and + Cardiac Stents  Normal cardiovascular exam Rhythm:Regular Rate:Normal     Neuro/Psych negative neurological ROS  negative psych ROS   GI/Hepatic Neg liver ROS, GERD  ,  Endo/Other  Hypothyroidism   Renal/GU negative Renal ROS  negative genitourinary   Musculoskeletal negative musculoskeletal ROS (+)   Abdominal   Peds negative pediatric ROS (+)  Hematology negative hematology ROS (+)   Anesthesia Other Findings   Reproductive/Obstetrics negative OB ROS                             Anesthesia Physical Anesthesia Plan  ASA: III  Anesthesia Plan: General   Post-op Pain Management:    Induction: Intravenous  Airway Management Planned: Double Lumen EBT  Additional Equipment: Arterial line  Intra-op Plan:   Post-operative Plan: Extubation in OR  Informed Consent: I have reviewed the patients History and Physical, chart, labs and discussed the procedure including the risks, benefits and alternatives for the proposed anesthesia with the patient or authorized representative who has indicated his/her understanding and acceptance.   Dental advisory given  Plan Discussed with: CRNA and Surgeon  Anesthesia Plan Comments:         Anesthesia Quick Evaluation

## 2015-01-24 NOTE — Progress Notes (Signed)
Spoke with LArry/spouse to inform of room number/status

## 2015-01-24 NOTE — Transfer of Care (Signed)
Immediate Anesthesia Transfer of Care Note  Patient: [name]  Procedure(s) Performed: Procedure(s): RIGHT LUNG BIOPSY (Right) RIGHT VIDEO BRONCHOSCOPY (N/A) right femoral ARTERIAL LINE INSERTION (Right) RIGHT MINI/LIMITED THORACOTOMY (Right)  Patient Location: PACU  Anesthesia Type:General  Level of Consciousness: awake and patient cooperative  Airway & Oxygen Therapy: Patient Spontanous Breathing and Patient connected to face mask oxygen  Post-op Assessment: Report given to RN, Post -op Vital signs reviewed and stable and Patient moving all extremities  Post vital signs: Reviewed and stable  Last Vitals:  Filed Vitals:   01/24/15 0802  BP: 136/66  Pulse: 67  Temp: 36.7 C  Resp: 18    Complications: No apparent anesthesia complications

## 2015-01-24 NOTE — Brief Op Note (Signed)
01/24/2015  1:17 PM  PATIENT:  [name]  70 y.o. female  PRE-OPERATIVE DIAGNOSIS:  Interstitial lung disease   POST-OPERATIVE DIAGNOSIS:  Interstitial lung disease   PROCEDURE:  BRONCHOSCOPY, RIGHT VATS/MINI THORACOTOMY, RLL and RML LUNG BIOPSIES, RIGHT FEMORAL ARTERIAL LINE INSERTION (Right)  SURGEON:  Surgeon(s) and Role:    * Ivin Poot, MD - Primary  PHYSICIAN ASSISTANT: Lars Pinks PA-C  ANESTHESIA:   general  EBL:  Total I/O In: 23 [I.V.:900] Out: 450 [Urine:450]  BLOOD ADMINISTERED:none  DRAINS: 71 French chest tube placed in the left pleural space   SPECIMEN:  Source of Specimen:  RML and RLL lung biopsies  DISPOSITION OF SPECIMEN:  PATHOLOGY  COUNTS CORRECT:  YES  DICTATION: .Dragon Dictation  PLAN OF CARE: Admit to inpatient   PATIENT DISPOSITION:  PACU - hemodynamically stable.   Delay start of Pharmacological VTE agent (>24hrs) due to surgical blood loss or risk of bleeding: yes

## 2015-01-24 NOTE — Anesthesia Procedure Notes (Signed)
Procedure Name: Intubation Date/Time: 01/24/2015 10:52 AM Performed by: Suzy Bouchard Pre-anesthesia Checklist: Patient identified, Emergency Drugs available, Suction available, Patient being monitored and Timeout performed Patient Re-evaluated:Patient Re-evaluated prior to inductionOxygen Delivery Method: Circle system utilized Preoxygenation: Pre-oxygenation with 100% oxygen Intubation Type: IV induction Ventilation: Mask ventilation without difficulty and Oral airway inserted - appropriate to patient size Laryngoscope Size: Glidescope Grade View: Grade I Tube type: Oral Tube size: 8.5 mm Number of attempts: 1 Airway Equipment and Method: Video-laryngoscopy and Rigid stylet Placement Confirmation: ETT inserted through vocal cords under direct vision,  positive ETCO2 and breath sounds checked- equal and bilateral Secured at: 18 cm Tube secured with: Tape Dental Injury: Teeth and Oropharynx as per pre-operative assessment

## 2015-01-24 NOTE — Anesthesia Postprocedure Evaluation (Signed)
Anesthesia Post Note  Patient: [name]  Procedure(s) Performed: Procedure(s) (LRB): RIGHT LUNG BIOPSY (Right) RIGHT VIDEO BRONCHOSCOPY (N/A) right femoral ARTERIAL LINE INSERTION (Right) RIGHT MINI/LIMITED THORACOTOMY (Right)  Patient location during evaluation: PACU Anesthesia Type: General Level of consciousness: awake and alert Pain management: pain level controlled Vital Signs Assessment: post-procedure vital signs reviewed and stable Respiratory status: spontaneous breathing, nonlabored ventilation, respiratory function stable and patient connected to nasal cannula oxygen Cardiovascular status: blood pressure returned to baseline and stable Postop Assessment: no signs of nausea or vomiting Anesthetic complications: no    Last Vitals:  Filed Vitals:   01/24/15 0802 01/24/15 1405  BP: 136/66   Pulse: 67   Temp: 36.7 C   Resp: 18 12    Last Pain:  Filed Vitals:   01/24/15 1405  PainSc: 6                  Aubriee Szeto S

## 2015-01-24 NOTE — Progress Notes (Signed)
Notified Dr. Kalman Shan of patient needing ABG, they were unable to obtain in PAT.  He stated they could do when Aline placed.

## 2015-01-24 NOTE — Progress Notes (Signed)
The patient was examined and preop studies reviewed. There has been no change from the prior exam and the patient is ready for surgery.  Plan bronchoscopy and Right VATS lung biopsy on D. Truddie Crumble

## 2015-01-24 NOTE — OR Nursing (Addendum)
Specimen collected for [facility] study during right video bronchoscopy by Dr. Prescott Gum on 01-24-2015. Right upper and lower lung lobe bronchial lavage as well as right upper and lower lung transbronchial biopsies collected for the study. Specimen for research study taken out of OR at 1155 by Luz Brazen, Rn. Additional speciman collected for research study during right mini thoracotomy. Right lower lung lobe biopsy collected from mini thoracotomy sent with Doreatha Martin, Rn at 70 for research study.

## 2015-01-24 NOTE — Research (Signed)
Late entry:  Original consent note by this RN includes a typo.  Patient was consented on Jan 17, 2015 NOT 2016.  Doreatha Martin, RN

## 2015-01-25 ENCOUNTER — Inpatient Hospital Stay (HOSPITAL_COMMUNITY): Payer: PPO

## 2015-01-25 DIAGNOSIS — R918 Other nonspecific abnormal finding of lung field: Secondary | ICD-10-CM | POA: Diagnosis not present

## 2015-01-25 LAB — GLUCOSE, CAPILLARY
Glucose-Capillary: 119 mg/dL — ABNORMAL HIGH (ref 65–99)
Glucose-Capillary: 125 mg/dL — ABNORMAL HIGH (ref 65–99)
Glucose-Capillary: 158 mg/dL — ABNORMAL HIGH (ref 65–99)
Glucose-Capillary: 168 mg/dL — ABNORMAL HIGH (ref 65–99)

## 2015-01-25 LAB — POCT I-STAT 3, ART BLOOD GAS (G3+)
Bicarbonate: 25.9 mEq/L — ABNORMAL HIGH (ref 20.0–24.0)
O2 Saturation: 99 %
Patient temperature: 99.1
TCO2: 27 mmol/L (ref 0–100)
pCO2 arterial: 45.6 mmHg — ABNORMAL HIGH (ref 35.0–45.0)
pH, Arterial: 7.362 (ref 7.350–7.450)
pO2, Arterial: 127 mmHg — ABNORMAL HIGH (ref 80.0–100.0)

## 2015-01-25 LAB — BASIC METABOLIC PANEL
ANION GAP: 11 (ref 5–15)
BUN: 15 mg/dL (ref 6–20)
CHLORIDE: 105 mmol/L (ref 101–111)
CO2: 25 mmol/L (ref 22–32)
Calcium: 7.7 mg/dL — ABNORMAL LOW (ref 8.9–10.3)
Creatinine, Ser: 1.53 mg/dL — ABNORMAL HIGH (ref 0.44–1.00)
GFR calc non Af Amer: 34 mL/min — ABNORMAL LOW (ref >60)
GFR, EST AFRICAN AMERICAN: 39 mL/min — AB (ref >60)
Glucose, Bld: 179 mg/dL — ABNORMAL HIGH (ref 65–99)
POTASSIUM: 3.6 mmol/L (ref 3.5–5.1)
Sodium: 141 mmol/L (ref 135–145)

## 2015-01-25 LAB — CBC
HEMATOCRIT: 28.3 % — AB (ref 36.0–46.0)
HEMOGLOBIN: 9 g/dL — AB (ref 12.0–15.0)
MCH: 27.4 pg (ref 26.0–34.0)
MCHC: 31.8 g/dL (ref 30.0–36.0)
MCV: 86.3 fL (ref 78.0–100.0)
Platelets: 233 10*3/uL (ref 150–400)
RBC: 3.28 MIL/uL — ABNORMAL LOW (ref 3.87–5.11)
RDW: 13.9 % (ref 11.5–15.5)
WBC: 17.2 10*3/uL — AB (ref 4.0–10.5)

## 2015-01-25 MED ORDER — OXYCODONE HCL 5 MG PO TABS
5.0000 mg | ORAL_TABLET | ORAL | Status: DC | PRN
  Administered 2015-01-25 – 2015-01-28 (×7): 5 mg via ORAL
  Filled 2015-01-25 (×7): qty 1

## 2015-01-25 MED ORDER — CHLORHEXIDINE GLUCONATE 4 % EX LIQD: Freq: Every day | CUTANEOUS | Status: DC

## 2015-01-25 MED ORDER — ENOXAPARIN SODIUM 30 MG/0.3ML ~~LOC~~ SOLN
30.0000 mg | SUBCUTANEOUS | Status: DC
  Administered 2015-01-26 – 2015-01-28 (×3): 30 mg via SUBCUTANEOUS
  Filled 2015-01-25 (×3): qty 0.3

## 2015-01-25 MED ORDER — POTASSIUM CHLORIDE 10 MEQ/50ML IV SOLN
10.0000 meq | INTRAVENOUS | Status: AC
  Administered 2015-01-25 (×3): 10 meq via INTRAVENOUS
  Filled 2015-01-25 (×2): qty 50

## 2015-01-25 MED ORDER — FUROSEMIDE 10 MG/ML IJ SOLN
20.0000 mg | Freq: Every day | INTRAMUSCULAR | Status: DC
  Administered 2015-01-25 – 2015-01-28 (×4): 20 mg via INTRAVENOUS
  Filled 2015-01-25 (×4): qty 2

## 2015-01-25 NOTE — Progress Notes (Addendum)
22cc Fentanyl wasted with Shelba Flake RN. Sherrie Mustache 6:11 PM

## 2015-01-25 NOTE — Progress Notes (Signed)
CT surgery p.m. Rounds  Patient much more comfortable this p.m. out of bed to chair A line-femoral was removed this a.m. No air leak or drainage from right chest tube-we'll remove in a.m. Responded well to Lasix diuresis.

## 2015-01-25 NOTE — Op Note (Signed)
[name], [name]                ACCOUNT NO.:  [id number]  MEDICAL RECORD NO.:  [id number]  LOCATION:  2S07C                        FACILITY:  [facility]  PHYSICIAN:  Ivin Poot, M.D.  DATE OF BIRTH:  Oct 10, 1945  DATE OF PROCEDURE: DATE OF DISCHARGE:                              OPERATIVE REPORT   OPERATION: 1. Videobronchoscopy. 2. Right VATS - mini thoracotomy for open lung biopsy.  SURGEON:  Ivin Poot, M.D.  ASSISTANT:  Lars Pinks, PA-C.  ANESTHESIA:  General by Dr. Myrtie Soman.  PREOPERATIVE DIAGNOSIS:  Interstitial lung disease with progressive symptoms of shortness of breath and decreasing pulmonary function tests.  POSTOPERATIVE DIAGNOSIS:  Interstitial lung disease with progressive symptoms of shortness of breath and decreasing pulmonary function tests.  CLINICAL NOTE:  The patient is a 70 year old female, nonsmoker.  Over the past 6-8 months, has had progressive decreased exercise tolerance, dyspnea on exertion, and documented 20% decrease in her pulmonary function testing.  She is a nonsmoker.  She has no evidence of reflux or other major airway disease problems.  She has been followed carefully by Dr. Chase Caller.  High definition chest CT scan shows changes consistent with hypersensitivity pneumonitis or interstitial lung disease.  Dr. Chase Caller recommended lung biopsy to help direct therapy as her symptoms have progressed and have not responded to medical therapy, which has been initiated over the recent few months.  Prior to surgery, I examined the patient in the office and discussed the procedure bronchoscopy and right mini thoracotomy for lung biopsy.  I discussed the details including the use of general anesthesia, the location of the surgical incisions, and expected postoperative recovery and chest tube placement.  I discussed with her the risks of air leak, bleeding, postoperative pain, postoperative pneumonia and death.  She demonstrated  her understanding and agreed to proceed with surgery under what I felt was an informed consent.  OPERATIVE FINDINGS: 1. The patient had a very difficult airway.  A double-lumen tube could     not be placed.  For that reason with a single-lumen tube, the     patient did not have a VATS procedure, but had a mini thoracotomy. 2. Stable hemodynamically throughout the operation and with minimal     blood loss.  DESCRIPTION OF PROCEDURE:  The patient was brought to the operating room and placed supine on the operating table where general anesthesia was induced.  A proper time-out was performed.  A videobronchoscope was placed.  The bronchoscope was passed down the mainstem bronchus to the carina.  There were no airway abnormalities.  The left mainstem bronchus, the left upper lobe and left lower lobe endobronchial segments were identified without abnormalities.  The bronchoscope was then passed down the right mainstem bronchus.  Bronchoalveolar lavage of the upper lobe and lower lobes were performed with the specimen sent for cytology and for the interstitial lung disease protocol developed by Dr. Chase Caller.  The upper lobe and lower lobes had transbronchial biopsies taken as well.  There was minimal bleeding.  There were no endobronchial lesions noted.  The bronchoscope was withdrawn.  The patient was then reintubated; however, the double-lumen tube, would not adequately conformed her airway  and this was removed and a single- lumen tube was placed.  The patient was then positioned right side up for right mini thoracotomy.  The right chest was prepped and draped as a sterile field.  A second time-out was performed.  A small 5-6 cm incision was made in the fourth interspace.  The ribs were gently spread.  There were no pleural adhesions.  The lung appeared to be pink and normal without pleural or parenchymal disease or scarring.  The Endo- GIA stapling device was used to staple wedge  biopsies from the lower lobe and one biopsy from the right middle lobe.  These were submitted to Pathology.  The staple lines were coated with thin layer of medical adhesive - Coseal.  A 28-French chest tube was placed and directed to the apex and secured to the skin.  The ribs were reapproximated with one pericostal suture.  The intercostal muscle was closed with interrupted #1 Vicryl.  The subcutaneous and skin layers were closed with running Vicryl.  Sterile dressings were applied.  The patient was extubated and returned to the recovery room.     Ivin Poot, M.D.     PV/MEDQ  D:  01/24/2015  T:  01/25/2015  Job:  [id number]  cc:   Brand Males, MD

## 2015-01-25 NOTE — Progress Notes (Addendum)
1 Day Post-Op Procedure(s) (LRB): RIGHT LUNG BIOPSY (Right) RIGHT VIDEO BRONCHOSCOPY (N/A) right femoral ARTERIAL LINE INSERTION (Right) RIGHT MINI/LIMITED THORACOTOMY (Right) Subjective: Stable after R VATS lung bx Hx IPF on home O2 No air leak from chest tube Objective: Vital signs in last 24 hours: Temp:  [97.5 F (36.4 C)-99.1 F (37.3 C)] 98.9 F (37.2 C) (01/14 0805) Pulse Rate:  [67-89] 84 (01/14 1000) Cardiac Rhythm:  [-] Normal sinus rhythm (01/14 0800) Resp:  [0-22] 20 (01/14 1000) BP: (98-140)/(68-99) 140/82 mmHg (01/14 1000) SpO2:  [89 %-100 %] 97 % (01/14 1000) Arterial Line BP: (86-159)/(46-65) 155/57 mmHg (01/14 1000) FiO2 (%):  [40 %] 40 % (01/13 2344)  Hemodynamic parameters for last 24 hours:  stable  Intake/Output from previous day: 01/13 0701 - 01/14 0700 In: 2041.7 [I.V.:1941.7; IV Piggyback:100] Out: [id number] [Urine:1615; Blood:50; Chest Tube:10] Intake/Output this shift: Total I/O In: 380 [P.O.:180; I.V.:100; IV Piggyback:100] Out: 439 [Urine:425; Chest Tube:14]  Scattered wheezes  Lab Results:  Recent Labs  01/24/15 1118 01/25/15 0430  WBC  --  17.2*  HGB 8.8* 9.0*  HCT 26.0* 28.3*  PLT  --  233   BMET:  Recent Labs  01/24/15 1118 01/25/15 0430  NA 142 141  K 3.4* 3.6  CL  --  105  CO2  --  25  GLUCOSE  --  179*  BUN  --  15  CREATININE  --  1.53*  CALCIUM  --  7.7*    PT/INR: No results for input(s): LABPROT, INR in the last 72 hours. ABG    Component Value Date/Time   PHART 7.362 01/25/2015 0442   HCO3 25.9* 01/25/2015 0442   TCO2 27 01/25/2015 0442   ACIDBASEDEF 1.0 01/24/2015 2035   O2SAT 99.0 01/25/2015 0442   CBG (last 3)   Recent Labs  01/24/15 2033 01/24/15 2358 01/25/15 0614  GLUCAP 128* 168* 158*    Assessment/Plan: S/P Procedure(s) (LRB): RIGHT LUNG BIOPSY (Right) RIGHT VIDEO BRONCHOSCOPY (N/A) right femoral ARTERIAL LINE INSERTION (Right) RIGHT MINI/LIMITED THORACOTOMY  (Right) Mobilize Diuresis chest tube to water seal   LOS: 1 day    Tharon Aquas Trigt III 01/25/2015

## 2015-01-25 NOTE — Progress Notes (Signed)
Pt refusing CPAP QHS at this time.  

## 2015-01-26 ENCOUNTER — Inpatient Hospital Stay (HOSPITAL_COMMUNITY): Payer: PPO

## 2015-01-26 DIAGNOSIS — J9611 Chronic respiratory failure with hypoxia: Secondary | ICD-10-CM | POA: Diagnosis not present

## 2015-01-26 DIAGNOSIS — J849 Interstitial pulmonary disease, unspecified: Secondary | ICD-10-CM | POA: Diagnosis not present

## 2015-01-26 DIAGNOSIS — R0600 Dyspnea, unspecified: Secondary | ICD-10-CM | POA: Diagnosis not present

## 2015-01-26 DIAGNOSIS — R05 Cough: Secondary | ICD-10-CM | POA: Diagnosis not present

## 2015-01-26 LAB — GLUCOSE, CAPILLARY
Glucose-Capillary: 119 mg/dL — ABNORMAL HIGH (ref 65–99)
Glucose-Capillary: 110 mg/dL — ABNORMAL HIGH (ref 65–99)

## 2015-01-26 LAB — CBC
HCT: 27.2 % — ABNORMAL LOW (ref 36.0–46.0)
Hemoglobin: 8.7 g/dL — ABNORMAL LOW (ref 12.0–15.0)
MCH: 27.5 pg (ref 26.0–34.0)
MCHC: 32 g/dL (ref 30.0–36.0)
MCV: 86.1 fL (ref 78.0–100.0)
PLATELETS: 217 10*3/uL (ref 150–400)
RBC: 3.16 MIL/uL — ABNORMAL LOW (ref 3.87–5.11)
RDW: 14.1 % (ref 11.5–15.5)
WBC: 11.8 10*3/uL — ABNORMAL HIGH (ref 4.0–10.5)

## 2015-01-26 LAB — COMPREHENSIVE METABOLIC PANEL
ALT: 21 U/L (ref 14–54)
ANION GAP: 9 (ref 5–15)
AST: 37 U/L (ref 15–41)
Albumin: 2.9 g/dL — ABNORMAL LOW (ref 3.5–5.0)
Alkaline Phosphatase: 71 U/L (ref 38–126)
BUN: 14 mg/dL (ref 6–20)
CHLORIDE: 105 mmol/L (ref 101–111)
CO2: 30 mmol/L (ref 22–32)
CREATININE: 1.33 mg/dL — AB (ref 0.44–1.00)
Calcium: 7.7 mg/dL — ABNORMAL LOW (ref 8.9–10.3)
GFR, EST AFRICAN AMERICAN: 46 mL/min — AB (ref >60)
GFR, EST NON AFRICAN AMERICAN: 40 mL/min — AB (ref >60)
Glucose, Bld: 114 mg/dL — ABNORMAL HIGH (ref 65–99)
POTASSIUM: 3.3 mmol/L — AB (ref 3.5–5.1)
SODIUM: 144 mmol/L (ref 135–145)
Total Bilirubin: 0.4 mg/dL (ref 0.3–1.2)
Total Protein: 5.7 g/dL — ABNORMAL LOW (ref 6.5–8.1)

## 2015-01-26 MED ORDER — POTASSIUM CHLORIDE CRYS ER 20 MEQ PO TBCR
20.0000 meq | EXTENDED_RELEASE_TABLET | Freq: Every day | ORAL | Status: DC
  Administered 2015-01-26 – 2015-01-28 (×3): 20 meq via ORAL
  Filled 2015-01-26 (×3): qty 1

## 2015-01-26 MED ORDER — POTASSIUM CHLORIDE 10 MEQ/50ML IV SOLN
10.0000 meq | INTRAVENOUS | Status: AC
Start: 2015-01-26 — End: 2015-01-26
  Administered 2015-01-26 (×3): 10 meq via INTRAVENOUS
  Filled 2015-01-26: qty 50

## 2015-01-26 MED ORDER — IPRATROPIUM BROMIDE 0.02 % IN SOLN
0.5000 mg | Freq: Two times a day (BID) | RESPIRATORY_TRACT | Status: DC
  Administered 2015-01-26 – 2015-01-28 (×4): 0.5 mg via RESPIRATORY_TRACT
  Filled 2015-01-26 (×4): qty 2.5

## 2015-01-26 MED ORDER — LEVALBUTEROL HCL 1.25 MG/0.5ML IN NEBU
1.2500 mg | INHALATION_SOLUTION | Freq: Two times a day (BID) | RESPIRATORY_TRACT | Status: DC
  Administered 2015-01-26 – 2015-01-28 (×4): 1.25 mg via RESPIRATORY_TRACT
  Filled 2015-01-26 (×4): qty 0.5

## 2015-01-26 MED ORDER — OXYMETAZOLINE HCL 0.05 % NA SOLN
1.0000 | Freq: Two times a day (BID) | NASAL | Status: DC
  Administered 2015-01-26 – 2015-01-28 (×3): 1 via NASAL
  Filled 2015-01-26 (×3): qty 15

## 2015-01-26 MED ORDER — POTASSIUM CHLORIDE 10 MEQ/50ML IV SOLN
10.0000 meq | INTRAVENOUS | Status: DC | PRN
  Filled 2015-01-26: qty 50

## 2015-01-26 MED ORDER — LEVALBUTEROL HCL 0.63 MG/3ML IN NEBU
0.6300 mg | INHALATION_SOLUTION | Freq: Four times a day (QID) | RESPIRATORY_TRACT | Status: DC | PRN
Start: 2015-01-26 — End: 2015-01-28

## 2015-01-26 NOTE — Progress Notes (Signed)
Received report from RN prior to receiving patient. Arrived approximately 41 via w/c with RN and her husband. [place] and oriented, denies pain. Receiving O2 @ 2L via N/C. Orient to the room, phone, call bell and all questions answered.

## 2015-01-26 NOTE — Progress Notes (Signed)
2 Days Post-Op Procedure(s) (LRB): RIGHT LUNG BIOPSY (Right) RIGHT VIDEO BRONCHOSCOPY (N/A) right femoral ARTERIAL LINE INSERTION (Right) RIGHT MINI/LIMITED THORACOTOMY (Right) Subjective: Doing well after right VATS and open lung biopsy for pulmonary fibrosis Minimal chest tube output without airleak-removed today Patient diuresing well Patient is getting stronger and able ambulate We'll transfer to telemetry after chest tube is removed  Objective: Vital signs in last 24 hours: Temp:  [98.2 F (36.8 C)-98.8 F (37.1 C)] 98.6 F (37 C) (01/15 1142) Pulse Rate:  [72-94] 73 (01/15 1100) Cardiac Rhythm:  [-] Normal sinus rhythm (01/15 0800) Resp:  [10-23] 14 (01/15 1100) BP: (99-148)/(57-105) 107/61 mmHg (01/15 1100) SpO2:  [92 %-99 %] 98 % (01/15 1100) Weight:  [153 lb 10.6 oz (69.7 kg)] 153 lb 10.6 oz (69.7 kg) (01/15 0555)  Hemodynamic parameters for last 24 hours:   stable  Intake/Output from previous day: 01/14 0701 - 01/15 0700 In: 1350 [P.O.:660; I.V.:490; IV Piggyback:200] Out: 2715 [Urine:2665; Chest Tube:50] Intake/Output this shift: Total I/O In: 450 [P.O.:360; I.V.:40; IV Piggyback:50] Out: 92 [Urine:80; Chest Tube:12]  Alert and comfortable Breath sounds improved Minimal edema  Lab Results:  Recent Labs  01/25/15 0430 01/26/15 0355  WBC 17.2* 11.8*  HGB 9.0* 8.7*  HCT 28.3* 27.2*  PLT 233 217   BMET:  Recent Labs  01/25/15 0430 01/26/15 0355  NA 141 144  K 3.6 3.3*  CL 105 105  CO2 25 30  GLUCOSE 179* 114*  BUN 15 14  CREATININE 1.53* 1.33*  CALCIUM 7.7* 7.7*    PT/INR: No results for input(s): LABPROT, INR in the last 72 hours. ABG    Component Value Date/Time   PHART 7.362 01/25/2015 0442   HCO3 25.9* 01/25/2015 0442   TCO2 27 01/25/2015 0442   ACIDBASEDEF 1.0 01/24/2015 2035   O2SAT 99.0 01/25/2015 0442   CBG (last 3)   Recent Labs  01/25/15 1802 01/26/15 0001 01/26/15 0628  GLUCAP 125* 119* 110*     Assessment/Plan: S/P Procedure(s) (LRB): RIGHT LUNG BIOPSY (Right) RIGHT VIDEO BRONCHOSCOPY (N/A) right femoral ARTERIAL LINE INSERTION (Right) RIGHT MINI/LIMITED THORACOTOMY (Right) Mobilize Diuresis Plan for transfer to step-down: see transfer orders   LOS: 2 days    [name] 01/26/2015

## 2015-01-27 ENCOUNTER — Inpatient Hospital Stay (HOSPITAL_COMMUNITY): Payer: PPO

## 2015-01-27 ENCOUNTER — Encounter (HOSPITAL_COMMUNITY): Payer: Self-pay | Admitting: Cardiothoracic Surgery

## 2015-01-27 DIAGNOSIS — J90 Pleural effusion, not elsewhere classified: Secondary | ICD-10-CM | POA: Diagnosis not present

## 2015-01-27 LAB — CBC
HCT: 27.7 % — ABNORMAL LOW (ref 36.0–46.0)
Hemoglobin: 8.6 g/dL — ABNORMAL LOW (ref 12.0–15.0)
MCH: 27 pg (ref 26.0–34.0)
MCHC: 31 g/dL (ref 30.0–36.0)
MCV: 86.8 fL (ref 78.0–100.0)
Platelets: 227 10*3/uL (ref 150–400)
RBC: 3.19 MIL/uL — ABNORMAL LOW (ref 3.87–5.11)
RDW: 14 % (ref 11.5–15.5)
WBC: 8.1 10*3/uL (ref 4.0–10.5)

## 2015-01-27 LAB — BASIC METABOLIC PANEL
Anion gap: 7 (ref 5–15)
BUN: 13 mg/dL (ref 6–20)
CO2: 30 mmol/L (ref 22–32)
Calcium: 8 mg/dL — ABNORMAL LOW (ref 8.9–10.3)
Chloride: 104 mmol/L (ref 101–111)
Creatinine, Ser: 1.15 mg/dL — ABNORMAL HIGH (ref 0.44–1.00)
GFR calc Af Amer: 55 mL/min — ABNORMAL LOW (ref >60)
GFR calc non Af Amer: 48 mL/min — ABNORMAL LOW (ref >60)
Glucose, Bld: 107 mg/dL — ABNORMAL HIGH (ref 65–99)
Potassium: 3.7 mmol/L (ref 3.5–5.1)
Sodium: 141 mmol/L (ref 135–145)

## 2015-01-27 NOTE — Progress Notes (Addendum)
[address]       [place] [address]             [phone]      3 Days Post-Op Procedure(s) (LRB): RIGHT LUNG BIOPSY (Right) RIGHT VIDEO BRONCHOSCOPY (N/A) right femoral ARTERIAL LINE INSERTION (Right) RIGHT MINI/LIMITED THORACOTOMY (Right) Subjective: Feels fair, some airway congestion that she is having difficulty clearing  Objective: Vital signs in last 24 hours: Temp:  [97.9 F (36.6 C)-98.8 F (37.1 C)] 97.9 F (36.6 C) (01/16 0449) Pulse Rate:  [69-80] 73 (01/16 0449) Cardiac Rhythm:  [-] Normal sinus rhythm (01/15 2013) Resp:  [10-19] 16 (01/16 0449) BP: (105-131)/(58-87) 125/58 mmHg (01/16 0449) SpO2:  [97 %-100 %] 99 % (01/16 0710)  Hemodynamic parameters for last 24 hours:    Intake/Output from previous day: 01/15 0701 - 01/16 0700 In: 760 [P.O.:600; I.V.:110; IV Piggyback:50] Out: [id number] [Urine:1725; Chest Tube:12] Intake/Output this shift:    General appearance: alert, cooperative, fatigued and no distress Heart: regular rate and rhythm Lungs: some upper airway ronchi/wheeze Abdomen: soft, nontender or distended Extremities: minor edema Wound: incis healing well  Lab Results:  Recent Labs  01/26/15 0355 01/27/15 0450  WBC 11.8* 8.1  HGB 8.7* 8.6*  HCT 27.2* 27.7*  PLT 217 227   BMET:  Recent Labs  01/25/15 0430 01/26/15 0355  NA 141 144  K 3.6 3.3*  CL 105 105  CO2 25 30  GLUCOSE 179* 114*  BUN 15 14  CREATININE 1.53* 1.33*  CALCIUM 7.7* 7.7*    PT/INR: No results for input(s): LABPROT, INR in the last 72 hours. ABG    Component Value Date/Time   PHART 7.362 01/25/2015 0442   HCO3 25.9* 01/25/2015 0442   TCO2 27 01/25/2015 0442   ACIDBASEDEF 1.0 01/24/2015 2035   O2SAT 99.0 01/25/2015 0442   CBG (last 3)   Recent Labs  01/25/15 1802 01/26/15 0001 01/26/15 0628  GLUCAP 125* 119* 110*    Meds Scheduled Meds: . acetaminophen  1,000 mg Oral 4 times per day   Or  . acetaminophen (TYLENOL) oral  liquid 160 mg/5 mL  1,000 mg Oral 4 times per day  . ALPRAZolam  0.5 mg Oral QHS  . aspirin EC  81 mg Oral Daily  . bisacodyl  10 mg Oral Daily  . calcitRIOL  0.25 mcg Oral Daily  . clopidogrel  75 mg Oral Q breakfast  . doxazosin  2 mg Oral Daily  . enoxaparin (LOVENOX) injection  30 mg Subcutaneous Q24H  . furosemide  20 mg Intravenous Daily  . ipratropium  0.5 mg Nebulization BID  . isosorbide mononitrate  90 mg Oral Daily  . levalbuterol  1.25 mg Nebulization BID  . levETIRAcetam  500 mg Oral BID  . levothyroxine  112 mcg Oral QAC breakfast  . metoprolol succinate  25 mg Oral Daily  . oxymetazoline  1 spray Each Nare BID  . pantoprazole  40 mg Oral BID AC  . PARoxetine  30 mg Oral Daily  . potassium chloride  20 mEq Oral Daily  . rosuvastatin  20 mg Oral q1800  . senna-docusate  1 tablet Oral QHS   Continuous Infusions: . dextrose 5 % and 0.45 % NaCl with KCl 20 mEq/L 10 mL/hr at 01/26/15 1800   PRN Meds:.levalbuterol, oxyCODONE, potassium chloride, traMADol  Xrays Dg Chest Port 1 View  01/26/2015  CLINICAL DATA:  Short of breath x1 week.  Cough EXAM: PORTABLE CHEST 1 VIEW  COMPARISON:  Radiograph 01/25/2015 FINDINGS: RIGHT central venous line unchanged. RIGHT chest tube without pneumothorax. There is volume loss the RIGHT hemi thorax. Patient rotated leftward. LEFT basilar atelectasis. IMPRESSION: 1. No significant change. 2. RIGHT chest tube in place without pneumothorax. 3. RIGHT atelectasis. Electronically Signed   By: Suzy Bouchard M.D.   On: 01/26/2015 10:15    Assessment/Plan: S/P Procedure(s) (LRB): RIGHT LUNG BIOPSY (Right) RIGHT VIDEO BRONCHOSCOPY (N/A) right femoral ARTERIAL LINE INSERTION (Right) RIGHT MINI/LIMITED THORACOTOMY (Right)  1 she is showing steady improvement, cont to push resp tx/rx . She is on O2 at night at home 2 CXR appearance is stable 3 Poss d/c 1-2 days as progress allows    LOS: 3 days    GOLD,WAYNE E 01/27/2015  patient examined  and medical record reviewed,agree with above note. Tharon Aquas Trigt III 01/27/2015

## 2015-01-27 NOTE — Discharge Instructions (Signed)
Thoracotomy, Care After °Refer to this sheet in the next few weeks. These instructions provide you with information on caring for yourself after your procedure. Your health care provider may also give you more specific instructions. Your treatment has been planned according to current medical practices, but problems sometimes occur. Call your health care provider if you have any problems or questions after your procedure. °WHAT TO EXPECT AFTER YOUR PROCEDURE °After your procedure, it is typical to have the following sensations: °· You may feel pain at the incision site. °· You may be constipated from the pain medicine given and the change in your level of activity. °· You may feel extremely tired. °HOME CARE INSTRUCTIONS °· Take over-the-counter or prescription medicines for pain, discomfort, or fever only as directed by your health care provider. It is very important to take pain relieving medicine before your pain becomes severe. You will be able to breathe and cough more comfortably if your pain is well controlled. °· Take deep breaths. Deep breathing helps to keep your lungs inflated and protects against a lung infection (pneumonia). °· Cough frequently. Even though coughing may cause discomfort, coughing is important to clear mucus (phlegm) and expand your lungs. Coughing helps prevent pneumonia. If it hurts to cough, hold a pillow against your chest when you cough. This may help with the discomfort. °· Continue to use an incentive spirometer as directed. The use of an incentive spirometer helps to keep your lungs inflated and protects against pneumonia. °· Change the bandages over your incision as needed or as directed by your health care provider. °· Remove the bandages over your chest tube site as directed by your health care provider. °· Resume your normal diet as directed. It is important to have adequate protein, calories, vitamins, and minerals to promote healing. °· Prevent constipation. °¨ Eat  high-fiber foods such as whole grain cereals and breads, brown rice, beans, and fresh fruits and vegetables. °¨ Drink enough water and fluids to keep your urine clear or pale yellow. Avoid drinking beverages containing caffeine. Beverages containing caffeine can cause dehydration and harden your stool. °¨ Talk to your health care provider about taking a stool softener or laxative. °· Avoid lifting until you are instructed otherwise. °· Do not drive until directed by your health care provider.  Do not drive while taking pain medicines (narcotics). °· Do not bathe, swim, or use a hot tub until directed by your health care provider. You may shower instead. Gently wash the area of your incision with water and soap as directed. Do not use anything else to clean your incision except as directed by your health care provider. °· Do not use any tobacco products including cigarettes, chewing tobacco, or electronic cigarettes. °· Avoid secondhand smoke. °· Schedule an appointment for stitch (suture) or staple removal as directed. °· Schedule and attend all follow-up visits as directed by your health care provider. It is important to keep all your appointments. °· Participate in pulmonary rehabilitation as directed by your health care provider. °· Do not travel by airplane for 2 weeks after your chest tube is removed. °SEEK MEDICAL CARE IF: °· You are bleeding from your wounds. °· Your heartbeat seems irregular. °· You have redness, swelling, or increasing pain in the wounds. °· There is pus coming from your wounds. °· There is a bad smell coming from the wound or dressing. °· You have a fever or chills. °· You have nausea or are vomiting. °· You have muscle aches. °SEEK   IMMEDIATE MEDICAL CARE IF: °· You have a rash. °· You have difficulty breathing. °· You have a reaction or side effect to medicines given. °· You have persistent nausea. °· You have lightheadedness or feel faint. °· You have shortness of breath or chest  pain. °· You have persistent pain. °  °This information is not intended to replace advice given to you by your health care provider. Make sure you discuss any questions you have with your health care provider. °  °Document Released: 06/12/2010 Document Revised: 05/14/2014 Document Reviewed: 08/16/2012 °Elsevier Interactive Patient Education ©2016 [employer] ° °

## 2015-01-27 NOTE — Progress Notes (Signed)
Patient is refusing CPAP tonight. She states she does not wear one at home. Patient is in no distress. RT will continue to monitor.

## 2015-01-27 NOTE — Care Management Important Message (Signed)
Important Message  Patient Details  Name: [name] MRN: [id number] Date of Birth: 05-Dec-1945   Medicare Important Message Given:  Yes    Loann Quill 01/27/2015, 12:35 PM

## 2015-01-27 NOTE — Care Management Note (Signed)
Case Management Note  Patient Details  Name: [name] MRN: [id number] Date of Birth: 07-07-1945  Subjective/Objective:   Pt admitted with interstitial lung disease                 Action/Plan:  Pt is from home with husband on night time home O2 3 L, pt is unsure of DME agency.  CM will continue to monitor for disposition needs.     Expected Discharge Date:                  Expected Discharge Plan:  Home/Self Care  In-House Referral:     Discharge planning Services  CM Consult  Post Acute Care Choice:    Choice offered to:     DME Arranged:    DME Agency:     HH Arranged:    HH Agency:     Status of Service:  In process, will continue to follow  Medicare Important Message Given:  Yes Date Medicare IM Given:    Medicare IM give by:    Date Additional Medicare IM Given:    Additional Medicare Important Message give by:     If discussed at [place] [facility] of Stay Meetings, dates discussed:    Additional Comments:  Maryclare Labrador, RN 01/27/2015, 4:29 PM

## 2015-01-27 NOTE — Discharge Summary (Signed)
Physician Discharge Summary       [address]       ,[place] [address]             [phone]    Patient ID: [name] MRN: [id number] DOB/AGE: 69-Apr-1947 69 y.o.  Admit date: 01/24/2015 Discharge date: 01/28/2015  Admission Diagnoses:  Discharge Diagnoses:  Active Problems:   ILD (interstitial lung disease) ([place])    Procedure (s):  Videobronchoscopy. 2. Right VATS - mini thoracotomy for open lung biopsy by Dr. Prescott Gum on 01/24/2015.   Pathology:pending   History of Presenting Illness: This is a 70 year old Caucasian female nonsmoker with progressive pulmonary functional decline over the past year and on home oxygen at night and with reduced exercise tolerance. She has been carefully evaluated by Dr. Chase Caller of pulmonary medicine. PFTs from 2 years ago had declined with FVC of 60% of predicted down from 72 percent 2 years ago and FEV1 at 60% of predicted down from 70% 2 years ago. Current diffusion capacity is 59% of predicted, unchanged. Her high definition chest CT scan shows evidence of hypersensitivity pneumonitis and her pulmonologist has requested a lung biopsy which I think is very appropriate to help direct therapy on this middle-aged patient.  Two years ago the patient underwent sternotomy and resection of a large pericardial cyst which was benign. At that time, she had no particular pulmonary issues post operatively.  The patient's cardiac status is stable after having had a drug-eluting stent placed 3 years ago. Echocardiogram performed a few months ago showed normal LVEF, 55-60%.. She is still taking Plavix but this could be stopped for a few days prior to her procedure now over 2 years after deployment of the stent.  She has symptomatic deterioration with evidence of interstitial lung disease on scans. Dr. Prescott Gum discussed the need for bronchoscopy, right VATS, right mini thoracotomy, and lung biopsies. Potential risks, benefits, and  complications were discussed with the patient and she agreed to proceed with surgery.   Brief Hospital Course:  Patient remained afebrile and hemodynamically stable. A line and foley were removed early in her post operative course. Chest tube had no air leak and daily chest x rays that were obtained remained stable. Chest tube was placed to water seal on 01/14. Chest tube was removed 01/26/2015. Follow up chest xray remained stable. She is ambulating on 2 liters of oxygen. She is tolerating a diet. She is felt surgically stable for discharge today.   Latest Vital Signs: Blood pressure 140/79, pulse 73, temperature 98.1 F (36.7 C), temperature source Oral, resp. rate 18, height 4\' 11"  (1.499 m), weight 153 lb 10.6 oz (69.7 kg), SpO2 98 %.  Physical Exam: General appearance: alert, cooperative, fatigued and no distress Heart: regular rate and rhythm Lungs: clear Abdomen: soft, nontender or distended Extremities: minor edema Wound: incis healing well  Discharge Condition:Stable and discharged to home  Recent laboratory studies:  Lab Results  Component Value Date   WBC 8.1 01/27/2015   HGB 8.6* 01/27/2015   HCT 27.7* 01/27/2015   MCV 86.8 01/27/2015   PLT 227 01/27/2015   Lab Results  Component Value Date   NA 141 01/27/2015   K 3.7 01/27/2015   CL 104 01/27/2015   CO2 30 01/27/2015   CREATININE 1.15* 01/27/2015   GLUCOSE 107* 01/27/2015    Diagnostic Studies: Dg Chest 2 View  01/27/2015  CLINICAL DATA:  History of lung biopsy. EXAM: CHEST  2 VIEW COMPARISON:  January 26, 2015. FINDINGS: Stable cardiomegaly. Sternotomy wires are noted. Right-sided chest tube has been removed without pneumothorax. Right subclavian catheter has also been removed. Stable mild left pleural effusion is noted. Stable bibasilar opacities are noted concerning for subsegmental atelectasis. Stable right rib fracture is noted. IMPRESSION: Stable bibasilar subsegmental atelectasis is noted with mild left  pleural effusion. No pneumothorax is noted status post right-sided chest tube removal. Electronically Signed   By: Marijo Conception, M.D.   On: 01/27/2015 07:59   Discharge Medications:   Medication List    TAKE these medications        ALPRAZolam 0.5 MG tablet  Commonly known as:  XANAX  Take 0.5 mg by mouth at bedtime.     aspirin EC 81 MG tablet  Take 81 mg by mouth daily.     calcitRIOL 0.25 MCG capsule  Commonly known as:  ROCALTROL  Take 0.25 mcg by mouth daily.     clopidogrel 75 MG tablet  Commonly known as:  PLAVIX  Take 1 tablet (75 mg total) by mouth daily with breakfast.     doxazosin 2 MG tablet  Commonly known as:  CARDURA  Take 1 tablet (2 mg total) by mouth daily.     furosemide 20 MG tablet  Commonly known as:  LASIX  Take 1 tablet (20 mg total) by mouth every other day.     HYDROcodone-acetaminophen 5-325 MG tablet  Commonly known as:  NORCO/VICODIN  Take 1-2 tablets by mouth every 4 (four) hours as needed for moderate pain.     isosorbide mononitrate 60 MG 24 hr tablet  Commonly known as:  IMDUR  TAKE 1 AND 1/2 TABLETS BY MOUTH EVERY DAY     levETIRAcetam 500 MG tablet  Commonly known as:  KEPPRA  Take 1 tablet (500 mg total) by mouth 2 (two) times daily.     levothyroxine 112 MCG tablet  Commonly known as:  SYNTHROID, LEVOTHROID  Take 112 mcg by mouth daily.     losartan 100 MG tablet  Commonly known as:  COZAAR  Take 100 mg by mouth every morning.     MATZIM LA 240 MG 24 hr tablet  Generic drug:  diltiazem  Take 240 mg by mouth every morning.     metoprolol succinate 50 MG 24 hr tablet  Commonly known as:  TOPROL-XL  Take 50 mg by mouth daily. Take with or immediately following a meal.     nitroGLYCERIN 0.4 MG SL tablet  Commonly known as:  NITROSTAT  Place 1 tablet (0.4 mg total) under the tongue every 5 (five) minutes as needed for chest pain.     pantoprazole 40 MG tablet  Commonly known as:  PROTONIX  TAKE 1 TABLET (40 MG TOTAL)  BY MOUTH 2 (TWO) TIMES DAILY.     PARoxetine 30 MG tablet  Commonly known as:  PAXIL  Take 30 mg by mouth daily.     potassium chloride SA 20 MEQ tablet  Commonly known as:  K-DUR,KLOR-CON  Take 2 tablets (40 mEq total) by mouth every other day. Take with Lasix.     rosuvastatin 20 MG tablet  Commonly known as:  CRESTOR  Take 1 tablet (20 mg total) by mouth every morning.     traMADol 50 MG tablet  Commonly known as:  ULTRAM  Take 1-2 tablets (50-100 mg total) by mouth every 6 (six) hours as needed (mild pain).        Follow Up Appointments: Follow-up Information  Follow up with Len Childs, MD On 02/19/2015.   Specialty:  Cardiothoracic Surgery   Why:  PA/LAT CXR to be taken (at [facility] which is in the same building as Dr. Lucianne Lei Trigt's office) on 02/19/2015 at 10:45 am;Appointment time is at 11:30 am   Contact information:   [place] [address] [phone]       Follow up with [facility] [place].   Specialty:  Cardiothoracic Surgery   Why:  nurse for suture removal   Contact information:   [address] [place], [place] [phone]      Signed: Macy Mis 01/28/2015, 8:02 AM

## 2015-01-28 MED ORDER — TRAMADOL HCL 50 MG PO TABS: 50.0000 mg | ORAL_TABLET | Freq: Four times a day (QID) | ORAL | Status: DC | PRN

## 2015-01-28 NOTE — Progress Notes (Signed)
[address]       [place] [address]             [phone]      4 Days Post-Op Procedure(s) (LRB): RIGHT LUNG BIOPSY (Right) RIGHT VIDEO BRONCHOSCOPY (N/A) right femoral ARTERIAL LINE INSERTION (Right) RIGHT MINI/LIMITED THORACOTOMY (Right) Subjective: Feels well  Objective: Vital signs in last 24 hours: Temp:  [98.1 F (36.7 C)-98.4 F (36.9 C)] 98.1 F (36.7 C) (01/17 0618) Pulse Rate:  [64-73] 73 (01/17 0618) Cardiac Rhythm:  [-] Normal sinus rhythm (01/16 1902) Resp:  [16-18] 18 (01/17 0618) BP: (119-140)/(68-79) 140/79 mmHg (01/17 0618) SpO2:  [97 %-100 %] 98 % (01/17 0725)  Hemodynamic parameters for last 24 hours:    Intake/Output from previous day: 01/16 0701 - 01/17 0700 In: 600 [P.O.:600] Out: 700 [Urine:700] Intake/Output this shift:    General appearance: alert, cooperative and no distress Heart: regular rate and rhythm Lungs: clear to auscultation bilaterally Abdomen: benign Extremities: no edema Wound: incis healing well  Lab Results:  Recent Labs  01/26/15 0355 01/27/15 0450  WBC 11.8* 8.1  HGB 8.7* 8.6*  HCT 27.2* 27.7*  PLT 217 227   BMET:  Recent Labs  01/26/15 0355 01/27/15 0450  NA 144 141  K 3.3* 3.7  CL 105 104  CO2 30 30  GLUCOSE 114* 107*  BUN 14 13  CREATININE 1.33* 1.15*  CALCIUM 7.7* 8.0*    PT/INR: No results for input(s): LABPROT, INR in the last 72 hours. ABG    Component Value Date/Time   PHART 7.362 01/25/2015 0442   HCO3 25.9* 01/25/2015 0442   TCO2 27 01/25/2015 0442   ACIDBASEDEF 1.0 01/24/2015 2035   O2SAT 99.0 01/25/2015 0442   CBG (last 3)   Recent Labs  01/25/15 1802 01/26/15 0001 01/26/15 0628  GLUCAP 125* 119* 110*    Meds Scheduled Meds: . acetaminophen  1,000 mg Oral 4 times per day   Or  . acetaminophen (TYLENOL) oral liquid 160 mg/5 mL  1,000 mg Oral 4 times per day  . ALPRAZolam  0.5 mg Oral QHS  . aspirin EC  81 mg Oral Daily  . bisacodyl  10 mg  Oral Daily  . calcitRIOL  0.25 mcg Oral Daily  . clopidogrel  75 mg Oral Q breakfast  . doxazosin  2 mg Oral Daily  . enoxaparin (LOVENOX) injection  30 mg Subcutaneous Q24H  . furosemide  20 mg Intravenous Daily  . ipratropium  0.5 mg Nebulization BID  . isosorbide mononitrate  90 mg Oral Daily  . levalbuterol  1.25 mg Nebulization BID  . levETIRAcetam  500 mg Oral BID  . levothyroxine  112 mcg Oral QAC breakfast  . metoprolol succinate  25 mg Oral Daily  . oxymetazoline  1 spray Each Nare BID  . pantoprazole  40 mg Oral BID AC  . PARoxetine  30 mg Oral Daily  . potassium chloride  20 mEq Oral Daily  . rosuvastatin  20 mg Oral q1800  . senna-docusate  1 tablet Oral QHS   Continuous Infusions: . dextrose 5 % and 0.45 % NaCl with KCl 20 mEq/L 10 mL/hr at 01/26/15 1800   PRN Meds:.levalbuterol, oxyCODONE, potassium chloride, traMADol  Xrays Dg Chest 2 View  01/27/2015  CLINICAL DATA:  History of lung biopsy. EXAM: CHEST  2 VIEW COMPARISON:  January 26, 2015. FINDINGS: Stable cardiomegaly. Sternotomy wires are noted. Right-sided chest tube has been removed without pneumothorax. Right subclavian  catheter has also been removed. Stable mild left pleural effusion is noted. Stable bibasilar opacities are noted concerning for subsegmental atelectasis. Stable right rib fracture is noted. IMPRESSION: Stable bibasilar subsegmental atelectasis is noted with mild left pleural effusion. No pneumothorax is noted status post right-sided chest tube removal. Electronically Signed   By: Marijo Conception, M.D.   On: 01/27/2015 07:59    Assessment/Plan: S/P Procedure(s) (LRB): RIGHT LUNG BIOPSY (Right) RIGHT VIDEO BRONCHOSCOPY (N/A) right femoral ARTERIAL LINE INSERTION (Right) RIGHT MINI/LIMITED THORACOTOMY (Right) Plan for discharge: see discharge orders   LOS: 4 days    Aubry Rankin E 01/28/2015

## 2015-01-28 NOTE — Progress Notes (Signed)
Pt. Discharged to home with husband Pt. D/C'd via  Wheelchair with volunteers  Discharge information reviewed and given All personal belongings given to Pt.  Education discussed and understood IV was d/c and intact upon removal Tele d/c

## 2015-01-28 NOTE — Care Management Note (Addendum)
Case Management Note  Patient Details  Name: [name] MRN: [id number] Date of Birth: 1945/05/11  Subjective/Objective:   Pt admitted with interstitial lung disease                 Action/Plan:  Pt is from home with husband on night time home O2 3 L, pt is unsure of DME agency.  CM will continue to monitor for disposition needs.     Expected Discharge Date:                  Expected Discharge Plan:  Home/Self Care  In-House Referral:     Discharge planning Services  CM Consult  Post Acute Care Choice:   (No need for resumption order, current liter flow is equal or less than ordered night requirement) Choice offered to:     DME Arranged:    DME Agency:     HH Arranged:    HH Agency:     Status of Service:  Completed, signed off  Medicare Important Message Given:  Yes Date Medicare IM Given:    Medicare IM give by:    Date Additional Medicare IM Given:    Additional Medicare Important Message give by:     If discussed at [place] of Stay Meetings, dates discussed:    Additional Comments: Pt will discharge home today 01/28/2015, per pt she doesn't need a tranport oxgyen tank because she only uses the supplemental oxygen at night.  Maryclare Labrador, RN 01/28/2015, 8:51 AM

## 2015-01-29 ENCOUNTER — Other Ambulatory Visit: Payer: Self-pay | Admitting: Cardiology

## 2015-01-30 ENCOUNTER — Encounter (HOSPITAL_COMMUNITY): Payer: Self-pay

## 2015-02-04 ENCOUNTER — Ambulatory Visit (INDEPENDENT_AMBULATORY_CARE_PROVIDER_SITE_OTHER): Payer: Self-pay | Admitting: *Deleted

## 2015-02-04 DIAGNOSIS — J8410 Pulmonary fibrosis, unspecified: Secondary | ICD-10-CM

## 2015-02-04 DIAGNOSIS — Z4802 Encounter for removal of sutures: Secondary | ICD-10-CM

## 2015-02-04 DIAGNOSIS — J849 Interstitial pulmonary disease, unspecified: Secondary | ICD-10-CM

## 2015-02-04 NOTE — Progress Notes (Signed)
Mrs. Godbout returns for suture removal of one previous chest tube site s/p R VATS, R LUNG WEDGE RESECTION for biopsy. It was easily removed.  The site is well healed as well as the mini incision. Lungs are clear to auscultation. She is having significant discomfort but was reassured. She will return as scheduled with a chest xray and will call for a f/u appointment with Dr. Chase Caller.

## 2015-02-05 ENCOUNTER — Encounter: Payer: Self-pay | Admitting: *Deleted

## 2015-02-17 ENCOUNTER — Encounter: Payer: Self-pay | Admitting: Internal Medicine

## 2015-02-17 ENCOUNTER — Ambulatory Visit (INDEPENDENT_AMBULATORY_CARE_PROVIDER_SITE_OTHER): Payer: PPO | Admitting: Internal Medicine

## 2015-02-17 VITALS — BP 120/72 | HR 61 | Ht 59.0 in | Wt 143.8 lb

## 2015-02-17 DIAGNOSIS — J849 Interstitial pulmonary disease, unspecified: Secondary | ICD-10-CM | POA: Diagnosis not present

## 2015-02-17 NOTE — Patient Instructions (Signed)
You have interstitial lung disease. We will refer you to [facility] interstitial lung disease clinic for second opinion. We will refer you for pulmonary rehab.

## 2015-02-17 NOTE — Progress Notes (Signed)
Subjective:    Patient ID: [name], female    DOB: [birth year], 70 y.o.   MRN: [id number]  HPI  OV 02/17/2015  Chief Complaint  Patient presents with  . Follow-up    Pt here after lung biospy. Pt c/o right lateral chest pain from the surgery, c/o chest congestion with non prod cough. Pt states her SOB is unchanged since last OV.    70 year old obese woman referred for pulm fibrosis. Hx from patient (poor hx) and chart and husband.  Office visit 11/22/2014: Dyspnea - insidious onset x several years. Progressive steadily x few years. Severe. Brought on with ltd exertion like ADLs. Relieved by rest. No associated cough or wheeze. ECHO 10/15/14 - gr1 diast dysfn and per husband recently started on lasix without improvement. Had CTA 11/9/176 - PE ruled out but per report CT shows  ILD.  Non specific scar changes on most recent CT and new RUL nodule in 2015 and 2016.   PFT 11/18/14 shows restriction with low DLCO - suggestive of ILD - FVC 70%, TLC 69%, DLCO 52% (BMI 29.67 kg/(m^2)).   Walking desat test 185 feet x 3 laps - stopped due to dyspnea prematurely at 2 laps. Also fatigue - did not desat at 2 laps.  02/17/15: Underwent right VATS, mini thoracotomy for open lung biopsy 01/24/2015 with Dr. Prescott Gum. Path with focal mild nonspecific chronic bronchiolitis and pleuritis with granulomatous features. Dyspnea improved since surgery but not back to baseline. Cough with chest congestion but unable to bring up. Pain improved from surgery. No fevers or chills. Occasional chronic chest pain. Some wheezing. She uses 2L O2 at night.   has a past medical history of Irritable bowel syndrome; Hypertension; Depression; Hypothyroidism; Internal hemorrhoids; Anxiety; Heart murmur; Hyperlipidemia; Vitamin D deficiency; Chronic low back pain; Carpal tunnel syndrome; History of non-ST elevation myocardial infarction (NSTEMI); S/P drug eluting coronary stent placement; GERD (gastroesophageal reflux disease);  History of hiatal hernia; Partial seizure disorder ([place]) (NEUROLOGIST-- DR Jannifer Franklin); History of supraventricular tachycardia; History of esophageal dilatation; Gross hematuria; Arthritis; Cervical spondylosis without myelopathy; S/P pericardial cyst excision; Coronary artery disease (CARDIOLOGIST-  DR Irish Lack); History of gout; PONV (postoperative nausea and vomiting); Anginal pain ([place]); Shortness of breath dyspnea; History of kidney stones; Seizures ([place]); and Pulmonary fibrosis ([place]).   reports that she quit smoking about 47 years ago. Her smoking use included Cigarettes. She started smoking about 50 years ago. She has a .9 pack-year smoking history. She has never used smokeless tobacco.  Past Surgical History  Procedure Laterality Date  . Mediasternotomy N/A 02/05/2013    Procedure: MEDIAN STERNOTOMY;  Surgeon: Ivin Poot, MD;  Location: [facility] OR;  Service: Thoracic;  Laterality: N/A;  . Biopsy of mediastinal mass N/A 02/05/2013    Procedure: RESECTION OF MEDIASTINAL MASS;  Surgeon: Ivin Poot, MD;  Location: [facility] OR;  Service: Thoracic;  Laterality: N/A;  . Ectopic pregnancy surgery  YRS AGO    SALPINGECTOMY  . Coronary angiogram  03/10/2011    Procedure: CORONARY ANGIOGRAM;  Surgeon: Jettie Booze, MD;  Location: [facility] CATH LAB;  Service: Cardiovascular;;  . Left heart catheterization with coronary angiogram N/A 03/14/2012    Procedure: LEFT HEART CATHETERIZATION WITH CORONARY ANGIOGRAM;  Surgeon: Sueanne Margarita, MD;  Location: [place] CATH LAB;  Service: Cardiovascular;  Laterality: N/A;  Normal LM,  50% pLAD,  70% mLAD,  D2 50-70%, very tortuous LAD,  70-82% ostial LCFX,  50% in-stent restenosis of  dRCA and mRCA  stent and 50-70% ostial PDA,  normal LVSF, ef 60%  . Percutaneous coronary stent intervention (pci-s) N/A 04/03/2012    Procedure: PERCUTANEOUS CORONARY STENT INTERVENTION (PCI-S);  Surgeon: Jettie Booze, MD;  Location: [facility] CATH LAB;  Service: Cardiovascular;  Laterality: N/A;    Successful PCI  mLAD with 2.75x12 Promus stent, postdilated to >0.33mm  . Coronary angioplasty with stent placement  11-22-2009  dr Irish Lack    Acute inferoposterior MI/  ef 60%,  PCI with DES x1 to dRCA,  25%  mLAD  . Coronary angioplasty with stent placement  06-26-2010  dr Daneen Schick    Cutting balloon angioplasty to dRCA with DES x1,  40% mLAD (non-obstructive CAD)  . Transthoracic echocardiogram  11-17-2011    grade I diastolic dysfunction/  ef 55-60%  . Cardiovascular stress test  11-22-2013  dr Irish Lack    normal lexiscan study/  no ischemia/  normal LVF and wall motion/ ef 81%  . Esophagogastroduodenoscopy (egd) with esophageal dilation  05-20-2011  . Colonoscopy  last one 2014  . Needle guided excision breast calcifications Right 08-09-2008  . Cystoscopy with retrograde pyelogram, ureteroscopy and stent placement Right 02/13/2014    Procedure: CYSTOSCOPY WITH RETROGRADE PYELOGRAM, URETEROSCOPY AND STENT PLACEMENT;  Surgeon: Bernestine Amass, MD;  Location: [facility];  Service: Urology;  Laterality: Right;  . Holmium laser application Right [id number]    Procedure: HOLMIUM LASER APPLICATION;  Surgeon: Bernestine Amass, MD;  Location: [facility];  Service: Urology;  Laterality: Right;  . Esophagogastroduodenoscopy N/A 02/15/2014    Procedure: ESOPHAGOGASTRODUODENOSCOPY (EGD);  Surgeon: Jerene Bears, MD;  Location: [facility];  Service: Endoscopy;  Laterality: N/A;  . Lung biopsy Right 01/24/2015    Procedure: RIGHT LUNG BIOPSY;  Surgeon: Ivin Poot, MD;  Location: [place];  Service: Thoracic;  Laterality: Right;  . Video bronchoscopy N/A 01/24/2015    Procedure: RIGHT VIDEO BRONCHOSCOPY;  Surgeon: Ivin Poot, MD;  Location: [facility] OR;  Service: Thoracic;  Laterality: N/A;  . Cardiac catheterization Right 01/24/2015    Procedure: right femoral ARTERIAL LINE INSERTION;  Surgeon: Ivin Poot, MD;  Location: [place];  Service: Thoracic;  Laterality: Right;  .  Thoracotomy Right 01/24/2015    Procedure: RIGHT MINI/LIMITED THORACOTOMY;  Surgeon: Ivin Poot, MD;  Location: [place];  Service: Thoracic;  Laterality: Right;    Allergies  Allergen Reactions  . Tape Rash    ? Clear tape    Immunization History  Administered Date(s) Administered  . Influenza,inj,Quad PF,36+ Mos 09/23/2014  . Influenza-Unspecified 10/11/2012  . Pneumococcal-Unspecified 11/15/2009    Family History  Problem Relation Age of Onset  . Breast cancer Sister   . Cancer Sister     breast  . Breast cancer      niece  . Heart disease Father   . Hypertension Father   . Emphysema Father   . Coronary artery disease Father   . Colon cancer Neg Hx   . Heart attack Mother   . CVA Mother      Current outpatient prescriptions:  .  ALPRAZolam (XANAX) 0.5 MG tablet, Take 0.5 mg by mouth at bedtime., Disp: , Rfl: 2 .  aspirin EC 81 MG tablet, Take 81 mg by mouth daily., Disp: , Rfl:  .  calcitRIOL (ROCALTROL) 0.25 MCG capsule, Take 0.25 mcg by mouth daily., Disp: , Rfl:  .  clopidogrel (PLAVIX) 75 MG tablet, Take 1 tablet (75 mg total) by mouth daily  with breakfast., Disp: 30 tablet, Rfl: 10 .  diltiazem (MATZIM LA) 240 MG 24 hr tablet, Take 240 mg by mouth every morning. , Disp: , Rfl:  .  doxazosin (CARDURA) 2 MG tablet, TAKE 1 TABLET (2 MG TOTAL) BY MOUTH DAILY., Disp: 30 tablet, Rfl: 6 .  furosemide (LASIX) 20 MG tablet, Take 1 tablet (20 mg total) by mouth every other day., Disp: 15 tablet, Rfl: 6 .  HYDROcodone-acetaminophen (NORCO/VICODIN) 5-325 MG per tablet, Take 1-2 tablets by mouth every 4 (four) hours as needed for moderate pain., Disp: , Rfl:  .  isosorbide mononitrate (IMDUR) 60 MG 24 hr tablet, TAKE 1 AND 1/2 TABLETS BY MOUTH EVERY DAY, Disp: 45 tablet, Rfl: 10 .  levETIRAcetam (KEPPRA) 500 MG tablet, Take 1 tablet (500 mg total) by mouth 2 (two) times daily., Disp: 60 tablet, Rfl: 11 .  levothyroxine (SYNTHROID, LEVOTHROID) 112 MCG tablet, Take 112 mcg by  mouth daily., Disp: , Rfl: 0 .  losartan (COZAAR) 100 MG tablet, Take 100 mg by mouth every morning. , Disp: , Rfl:  .  metoprolol succinate (TOPROL-XL) 50 MG 24 hr tablet, Take 50 mg by mouth daily. Take with or immediately following a meal., Disp: , Rfl:  .  nitroGLYCERIN (NITROSTAT) 0.4 MG SL tablet, Place 1 tablet (0.4 mg total) under the tongue every 5 (five) minutes as needed for chest pain., Disp: 25 tablet, Rfl: 5 .  pantoprazole (PROTONIX) 40 MG tablet, TAKE 1 TABLET (40 MG TOTAL) BY MOUTH 2 (TWO) TIMES DAILY., Disp: 60 tablet, Rfl: 11 .  PARoxetine (PAXIL) 30 MG tablet, Take 30 mg by mouth daily., Disp: , Rfl:  .  potassium chloride SA (K-DUR,KLOR-CON) 20 MEQ tablet, Take 2 tablets (40 mEq total) by mouth every other day. Take with Lasix., Disp: 30 tablet, Rfl: 6 .  rosuvastatin (CRESTOR) 20 MG tablet, Take 1 tablet (20 mg total) by mouth every morning., Disp: 90 tablet, Rfl: 3 .  traMADol (ULTRAM) 50 MG tablet, Take 1-2 tablets (50-100 mg total) by mouth every 6 (six) hours as needed (mild pain)., Disp: 30 tablet, Rfl: 0  Review of Systems  Constitutional: Negative for fever and unexpected weight change.  HENT: Negative for congestion, dental problem, ear pain, nosebleeds, postnasal drip, rhinorrhea, sinus pressure, sneezing, sore throat and trouble swallowing.   Eyes: Negative for redness and itching.  Respiratory: Positive for cough and shortness of breath. Negative for chest tightness and wheezing.   Cardiovascular: Negative for palpitations and leg swelling.  Gastrointestinal: Negative for nausea and vomiting.  Genitourinary: Negative for dysuria.  Musculoskeletal: Negative for joint swelling.  Skin: Negative for rash.  Neurological: Negative for headaches.  Hematological: Does not bruise/bleed easily.  Psychiatric/Behavioral: Negative for dysphoric mood. The patient is not nervous/anxious.    Objective:   Physical Exam  Constitutional: She is oriented to person, place, and  time. She appears well-developed and well-nourished. No distress.  HENT:  Head: Normocephalic and atraumatic.  Right Ear: External ear normal.  Left Ear: External ear normal.  Mouth/Throat: Oropharynx is clear and moist. No oropharyngeal exudate.  Eyes: Conjunctivae and EOM are normal. Pupils are equal, round, and reactive to light. Right eye exhibits no discharge. Left eye exhibits no discharge. No scleral icterus.  Neck: Normal range of motion. Neck supple. No JVD present. No tracheal deviation present. No thyromegaly present.  Cardiovascular: Normal rate, regular rhythm, normal heart sounds and intact distal pulses.  Exam reveals no gallop and no friction rub.   No murmur  heard. Pulmonary/Chest: Effort normal and breath sounds normal. No respiratory distress. She has no wheezes. She has no rales. She exhibits no tenderness.  Abdominal: Soft. Bowel sounds are normal. She exhibits no distension and no mass. There is no tenderness. There is no rebound and no guarding.  Musculoskeletal: Normal range of motion. She exhibits no edema or tenderness.  Lymphadenopathy:    She has no cervical adenopathy.  Neurological: She is alert and oriented to person, place, and time. She has normal reflexes. No cranial nerve deficit. She exhibits normal muscle tone. Coordination normal.  Skin: Skin is warm and dry. No rash noted. She is not diaphoretic. No erythema. No pallor.  Psychiatric: She has a normal mood and affect. Her behavior is normal. Judgment and thought content normal.  Vitals reviewed.   Filed Vitals:   02/17/15 1331  BP: 120/72  Pulse: 61  Height: 4\' 11"  (1.499 m)  Weight: 143 lb 12.8 oz (65.227 kg)  SpO2: 97%   Assessment & Plan:     ICD-9-CM ICD-10-CM   1. ILD (interstitial lung disease) ([place]) 515 J84.9 Ambulatory referral to Pulmonology   Persistent dyspnea. Biopsy lung 01/24/2015 with Dr. Prescott Gum. Path with focal mild nonspecific chronic bronchiolitis and pleuritis with  granulomatous features. Per Dr. Tedra Senegal, no specific diagnosis. Suspicion for hypersensitivity pneumonitis by CT and pathology shows granulomatous features but Dr. Doyle Askew does not see features suggesting hypersensitivity pneumonitis. Also no features to suggest UIP, NSIP, BOOP, smokers' bronchiolitis. Remains unclassified ILD which does portend a good prognosis.   -Because she has persistent SOB, will refer to [facility] ILD clinic for second opinon -Pulmonary rehab referral -Follow up with Dr. Chase Caller in 3 months  Discussed with Dr. Theressa Millard, MD  Internal Medicine PGY-2 02/17/2015 1:30 PM

## 2015-02-18 ENCOUNTER — Other Ambulatory Visit: Payer: Self-pay | Admitting: Cardiothoracic Surgery

## 2015-02-18 DIAGNOSIS — J849 Interstitial pulmonary disease, unspecified: Secondary | ICD-10-CM

## 2015-02-19 ENCOUNTER — Encounter: Payer: Self-pay | Admitting: Cardiothoracic Surgery

## 2015-02-19 ENCOUNTER — Ambulatory Visit
Admission: RE | Admit: 2015-02-19 | Discharge: 2015-02-19 | Disposition: A | Discharge status: Home / Self Care | Payer: PPO | Source: Ambulatory Visit | Attending: Cardiothoracic Surgery | Admitting: Cardiothoracic Surgery

## 2015-02-19 ENCOUNTER — Ambulatory Visit (INDEPENDENT_AMBULATORY_CARE_PROVIDER_SITE_OTHER): Payer: Self-pay | Admitting: Cardiothoracic Surgery

## 2015-02-19 VITALS — BP 119/80 | HR 84 | Resp 20 | Ht 59.0 in | Wt 143.0 lb

## 2015-02-19 DIAGNOSIS — J849 Interstitial pulmonary disease, unspecified: Secondary | ICD-10-CM

## 2015-02-19 DIAGNOSIS — J8410 Pulmonary fibrosis, unspecified: Secondary | ICD-10-CM

## 2015-02-19 DIAGNOSIS — J9859 Other diseases of mediastinum, not elsewhere classified: Secondary | ICD-10-CM

## 2015-02-19 DIAGNOSIS — R0602 Shortness of breath: Secondary | ICD-10-CM | POA: Diagnosis not present

## 2015-02-19 NOTE — Progress Notes (Signed)
PCP is Reginia Naas, MD Referring Provider is Parrett, Fonnie Mu, NP  Chief Complaint  Patient presents with  . Routine Post Op    3 week f/u from [facility] with CXR s/p Right VATS - mini thoracotomy for open lung biopsy 01/24/15    HPI:one month followup after right VATS for open lung biopsy over concern for pulmonary fibrosis. Final pathology showed no evidence of pulmonary  fibrosis but with changes of chronic bronchitis, possible granulomatous disease or pulmonary vascular disease.the patient is using oxygen only at night. She is increasing her strength and endurance. Her post thoracotomy pain is much improved and she is taking only Tylenol. She is having a cough from upper airway-sinus congestion. Chest x-ray taken today shows clear lung fields, no pleural effusion. The patient in the past had a sternotomy for resection of a large pericardial cyst which has not recurred.  Patient will be referred by Dr. Chase Caller to [facility] pulmonary medicine for evaluation of her lung disease which fortunately does not appear to be pulmonary fibrosis.   Past Medical History  Diagnosis Date  . Irritable bowel syndrome   . Hypertension   . Depression   . Hypothyroidism   . Internal hemorrhoids   . Anxiety   . Heart murmur   . Hyperlipidemia   . Vitamin D deficiency   . Chronic low back pain   . Carpal tunnel syndrome   . History of non-ST elevation myocardial infarction (NSTEMI)     2011--  S/P PCI WITH SENTING  . S/P drug eluting coronary stent placement     X3  in 2011, 2012, 2014  . GERD (gastroesophageal reflux disease)   . History of hiatal hernia   . Partial seizure disorder ([facility]) NEUROLOGIST-- DR WILLIS    NOCTURNAL  . History of supraventricular tachycardia     2011-- resolved  . History of esophageal dilatation     FOR STRIUCTURE  . Gross hematuria   . Arthritis   . Cervical spondylosis without myelopathy   . S/P pericardial cyst excision     02-05-2013  benign  . Coronary  artery disease CARDIOLOGIST-  DR Irish Lack    hx NSTEMI inferoposterior 2011 s/p PCI  total x3 DES's  . History of gout   . PONV (postoperative nausea and vomiting)     hard time getting iv site-had to do neck stick 2 yrs ago  . Anginal pain ([place])     over yr last time  . Shortness of breath dyspnea   . History of kidney stones   . Seizures ([place])     none in yrs  . Pulmonary fibrosis [facility])     Past Surgical History  Procedure Laterality Date  . Mediasternotomy N/A 02/05/2013    Procedure: MEDIAN STERNOTOMY;  Surgeon: Ivin Poot, MD;  Location: [facility] OR;  Service: Thoracic;  Laterality: N/A;  . Biopsy of mediastinal mass N/A 02/05/2013    Procedure: RESECTION OF MEDIASTINAL MASS;  Surgeon: Ivin Poot, MD;  Location: [facility] OR;  Service: Thoracic;  Laterality: N/A;  . Ectopic pregnancy surgery  YRS AGO    SALPINGECTOMY  . Coronary angiogram  03/10/2011    Procedure: CORONARY ANGIOGRAM;  Surgeon: Jettie Booze, MD;  Location: [facility] CATH LAB;  Service: Cardiovascular;;  . Left heart catheterization with coronary angiogram N/A 03/14/2012    Procedure: LEFT HEART CATHETERIZATION WITH CORONARY ANGIOGRAM;  Surgeon: Sueanne Margarita, MD;  Location: [place] CATH LAB;  Service: Cardiovascular;  Laterality: N/A;  Normal  LM,  50% pLAD,  70% mLAD,  D2 50-70%, very tortuous LAD,  70-82% ostial LCFX,  50% in-stent restenosis of  dRCA and mRCA  stent and 50-70% ostial PDA,  normal LVSF, ef 60%  . Percutaneous coronary stent intervention (pci-s) N/A 04/03/2012    Procedure: PERCUTANEOUS CORONARY STENT INTERVENTION (PCI-S);  Surgeon: Jettie Booze, MD;  Location: [facility] CATH LAB;  Service: Cardiovascular;  Laterality: N/A;   Successful PCI  mLAD with 2.75x12 Promus stent, postdilated to >0.79mm  . Coronary angioplasty with stent placement  11-22-2009  dr Irish Lack    Acute inferoposterior MI/  ef 60%,  PCI with DES x1 to dRCA,  25%  mLAD  . Coronary angioplasty with stent placement  06-26-2010  dr Daneen Schick     Cutting balloon angioplasty to dRCA with DES x1,  40% mLAD (non-obstructive CAD)  . Transthoracic echocardiogram  11-17-2011    grade I diastolic dysfunction/  ef 55-60%  . Cardiovascular stress test  11-22-2013  dr Irish Lack    normal lexiscan study/  no ischemia/  normal LVF and wall motion/ ef 81%  . Esophagogastroduodenoscopy (egd) with esophageal dilation  05-20-2011  . Colonoscopy  last one 2014  . Needle guided excision breast calcifications Right 08-09-2008  . Cystoscopy with retrograde pyelogram, ureteroscopy and stent placement Right 02/13/2014    Procedure: CYSTOSCOPY WITH RETROGRADE PYELOGRAM, URETEROSCOPY AND STENT PLACEMENT;  Surgeon: Bernestine Amass, MD;  Location: [facility];  Service: Urology;  Laterality: Right;  . Holmium laser application Right [id number]    Procedure: HOLMIUM LASER APPLICATION;  Surgeon: Bernestine Amass, MD;  Location: [facility];  Service: Urology;  Laterality: Right;  . Esophagogastroduodenoscopy N/A 02/15/2014    Procedure: ESOPHAGOGASTRODUODENOSCOPY (EGD);  Surgeon: Jerene Bears, MD;  Location: [facility];  Service: Endoscopy;  Laterality: N/A;  . Lung biopsy Right 01/24/2015    Procedure: RIGHT LUNG BIOPSY;  Surgeon: Ivin Poot, MD;  Location: [place];  Service: Thoracic;  Laterality: Right;  . Video bronchoscopy N/A 01/24/2015    Procedure: RIGHT VIDEO BRONCHOSCOPY;  Surgeon: Ivin Poot, MD;  Location: [facility] OR;  Service: Thoracic;  Laterality: N/A;  . Cardiac catheterization Right 01/24/2015    Procedure: right femoral ARTERIAL LINE INSERTION;  Surgeon: Ivin Poot, MD;  Location: [place];  Service: Thoracic;  Laterality: Right;  . Thoracotomy Right 01/24/2015    Procedure: RIGHT MINI/LIMITED THORACOTOMY;  Surgeon: Ivin Poot, MD;  Location: [facility] OR;  Service: Thoracic;  Laterality: Right;    Family History  Problem Relation Age of Onset  . Breast cancer Sister   . Cancer Sister     breast  . Breast cancer       niece  . Heart disease Father   . Hypertension Father   . Emphysema Father   . Coronary artery disease Father   . Colon cancer Neg Hx   . Heart attack Mother   . CVA Mother     Social History Social History  Substance Use Topics  . Smoking status: Former Smoker -- 0.30 packs/day for 3 years    Types: Cigarettes    Start date: 11/08/1964    Quit date: 11/09/1967  . Smokeless tobacco: Never Used  . Alcohol Use: No    Current Outpatient Prescriptions  Medication Sig Dispense Refill  . ALPRAZolam (XANAX) 0.5 MG tablet Take 0.5 mg by mouth at bedtime.  2  . aspirin EC 81 MG tablet Take 81 mg by  mouth daily.    . calcitRIOL (ROCALTROL) 0.25 MCG capsule Take 0.25 mcg by mouth daily.    . clopidogrel (PLAVIX) 75 MG tablet Take 1 tablet (75 mg total) by mouth daily with breakfast. 30 tablet 10  . diltiazem (MATZIM LA) 240 MG 24 hr tablet Take 240 mg by mouth every morning.     Marland Kitchen doxazosin (CARDURA) 2 MG tablet TAKE 1 TABLET (2 MG TOTAL) BY MOUTH DAILY. 30 tablet 6  . furosemide (LASIX) 20 MG tablet Take 1 tablet (20 mg total) by mouth every other day. 15 tablet 6  . isosorbide mononitrate (IMDUR) 60 MG 24 hr tablet TAKE 1 AND 1/2 TABLETS BY MOUTH EVERY DAY 45 tablet 10  . levETIRAcetam (KEPPRA) 500 MG tablet Take 1 tablet (500 mg total) by mouth 2 (two) times daily. 60 tablet 11  . levothyroxine (SYNTHROID, LEVOTHROID) 112 MCG tablet Take 112 mcg by mouth daily.  0  . losartan (COZAAR) 100 MG tablet Take 100 mg by mouth every morning.     . metoprolol succinate (TOPROL-XL) 50 MG 24 hr tablet Take 50 mg by mouth daily. Take with or immediately following a meal.    . nitroGLYCERIN (NITROSTAT) 0.4 MG SL tablet Place 1 tablet (0.4 mg total) under the tongue every 5 (five) minutes as needed for chest pain. 25 tablet 5  . pantoprazole (PROTONIX) 40 MG tablet TAKE 1 TABLET (40 MG TOTAL) BY MOUTH 2 (TWO) TIMES DAILY. 60 tablet 11  . PARoxetine (PAXIL) 30 MG tablet Take 30 mg by mouth daily.    .  potassium chloride SA (K-DUR,KLOR-CON) 20 MEQ tablet Take 2 tablets (40 mEq total) by mouth every other day. Take with Lasix. 30 tablet 6  . rosuvastatin (CRESTOR) 20 MG tablet Take 1 tablet (20 mg total) by mouth every morning. 90 tablet 3   No current facility-administered medications for this visit.    Allergies  Allergen Reactions  . Tape Rash    ? Clear tape    Review of Systems   Improved strength and appetite Mild shortness of breath with exertion She is off oxygen during the day but uses it at night-2 L per minute Surgical incisions healing well  BP 119/80 mmHg  Pulse 84  Resp 20  Ht 4\' 11"  (1.499 m)  Wt 143 lb (64.864 kg)  BMI 28.87 kg/m2  SpO2 96% Physical Exam Alert and comfortable Breath sounds clear and equal Heart rhythm regular without murmur Right VATS incision well-healed No pedal edema or leg tenderness  Diagnostic Tests: Chest x-ray today shows clear lung fields after right VATS and lung biopsy  Impression: Doing well. She can resume normal daily activities. She can drive i perform housework  Plan:return in 6 weeks for final review after surgery.   Len Childs, MD [facility] [phone]

## 2015-02-20 DIAGNOSIS — R509 Fever, unspecified: Secondary | ICD-10-CM | POA: Diagnosis not present

## 2015-02-20 DIAGNOSIS — R112 Nausea with vomiting, unspecified: Secondary | ICD-10-CM | POA: Diagnosis not present

## 2015-02-20 DIAGNOSIS — I10 Essential (primary) hypertension: Secondary | ICD-10-CM | POA: Diagnosis not present

## 2015-02-20 DIAGNOSIS — F329 Major depressive disorder, single episode, unspecified: Secondary | ICD-10-CM | POA: Diagnosis not present

## 2015-02-20 DIAGNOSIS — N183 Chronic kidney disease, stage 3 (moderate): Secondary | ICD-10-CM | POA: Diagnosis not present

## 2015-02-20 DIAGNOSIS — I2510 Atherosclerotic heart disease of native coronary artery without angina pectoris: Secondary | ICD-10-CM | POA: Diagnosis not present

## 2015-02-20 DIAGNOSIS — K219 Gastro-esophageal reflux disease without esophagitis: Secondary | ICD-10-CM | POA: Diagnosis not present

## 2015-02-23 ENCOUNTER — Inpatient Hospital Stay (HOSPITAL_COMMUNITY)
Admission: EM | Admit: 2015-02-23 | Discharge: 2015-02-26 | DRG: 682 | Disposition: A | Discharge status: Home Health | Payer: PPO | Attending: Internal Medicine | Admitting: Internal Medicine

## 2015-02-23 ENCOUNTER — Emergency Department (HOSPITAL_COMMUNITY): Payer: PPO

## 2015-02-23 ENCOUNTER — Encounter (HOSPITAL_COMMUNITY): Payer: Self-pay | Admitting: Family Medicine

## 2015-02-23 DIAGNOSIS — Z823 Family history of stroke: Secondary | ICD-10-CM

## 2015-02-23 DIAGNOSIS — Z87442 Personal history of urinary calculi: Secondary | ICD-10-CM | POA: Diagnosis not present

## 2015-02-23 DIAGNOSIS — F419 Anxiety disorder, unspecified: Secondary | ICD-10-CM | POA: Diagnosis present

## 2015-02-23 DIAGNOSIS — J8410 Pulmonary fibrosis, unspecified: Secondary | ICD-10-CM | POA: Diagnosis not present

## 2015-02-23 DIAGNOSIS — Z803 Family history of malignant neoplasm of breast: Secondary | ICD-10-CM | POA: Diagnosis not present

## 2015-02-23 DIAGNOSIS — N179 Acute kidney failure, unspecified: Secondary | ICD-10-CM | POA: Diagnosis present

## 2015-02-23 DIAGNOSIS — I129 Hypertensive chronic kidney disease with stage 1 through stage 4 chronic kidney disease, or unspecified chronic kidney disease: Secondary | ICD-10-CM | POA: Diagnosis not present

## 2015-02-23 DIAGNOSIS — F329 Major depressive disorder, single episode, unspecified: Secondary | ICD-10-CM | POA: Diagnosis not present

## 2015-02-23 DIAGNOSIS — K219 Gastro-esophageal reflux disease without esophagitis: Secondary | ICD-10-CM | POA: Diagnosis present

## 2015-02-23 DIAGNOSIS — Z79899 Other long term (current) drug therapy: Secondary | ICD-10-CM

## 2015-02-23 DIAGNOSIS — M109 Gout, unspecified: Secondary | ICD-10-CM | POA: Diagnosis present

## 2015-02-23 DIAGNOSIS — I2510 Atherosclerotic heart disease of native coronary artery without angina pectoris: Secondary | ICD-10-CM | POA: Diagnosis present

## 2015-02-23 DIAGNOSIS — G8929 Other chronic pain: Secondary | ICD-10-CM | POA: Diagnosis present

## 2015-02-23 DIAGNOSIS — N390 Urinary tract infection, site not specified: Secondary | ICD-10-CM | POA: Diagnosis not present

## 2015-02-23 DIAGNOSIS — I9589 Other hypotension: Secondary | ICD-10-CM

## 2015-02-23 DIAGNOSIS — Z7982 Long term (current) use of aspirin: Secondary | ICD-10-CM | POA: Diagnosis not present

## 2015-02-23 DIAGNOSIS — E876 Hypokalemia: Secondary | ICD-10-CM | POA: Diagnosis not present

## 2015-02-23 DIAGNOSIS — E785 Hyperlipidemia, unspecified: Secondary | ICD-10-CM | POA: Diagnosis not present

## 2015-02-23 DIAGNOSIS — K580 Irritable bowel syndrome with diarrhea: Secondary | ICD-10-CM | POA: Diagnosis not present

## 2015-02-23 DIAGNOSIS — N183 Chronic kidney disease, stage 3 (moderate): Secondary | ICD-10-CM | POA: Diagnosis not present

## 2015-02-23 DIAGNOSIS — J849 Interstitial pulmonary disease, unspecified: Secondary | ICD-10-CM | POA: Diagnosis not present

## 2015-02-23 DIAGNOSIS — Z955 Presence of coronary angioplasty implant and graft: Secondary | ICD-10-CM | POA: Diagnosis not present

## 2015-02-23 DIAGNOSIS — J209 Acute bronchitis, unspecified: Secondary | ICD-10-CM | POA: Diagnosis not present

## 2015-02-23 DIAGNOSIS — I959 Hypotension, unspecified: Secondary | ICD-10-CM | POA: Diagnosis not present

## 2015-02-23 DIAGNOSIS — G40909 Epilepsy, unspecified, not intractable, without status epilepticus: Secondary | ICD-10-CM | POA: Diagnosis present

## 2015-02-23 DIAGNOSIS — Z825 Family history of asthma and other chronic lower respiratory diseases: Secondary | ICD-10-CM | POA: Diagnosis not present

## 2015-02-23 DIAGNOSIS — R531 Weakness: Secondary | ICD-10-CM

## 2015-02-23 DIAGNOSIS — Z8249 Family history of ischemic heart disease and other diseases of the circulatory system: Secondary | ICD-10-CM | POA: Diagnosis not present

## 2015-02-23 DIAGNOSIS — R0602 Shortness of breath: Secondary | ICD-10-CM | POA: Diagnosis not present

## 2015-02-23 DIAGNOSIS — M545 Low back pain: Secondary | ICD-10-CM | POA: Diagnosis not present

## 2015-02-23 DIAGNOSIS — R404 Transient alteration of awareness: Secondary | ICD-10-CM | POA: Diagnosis not present

## 2015-02-23 DIAGNOSIS — Z9109 Other allergy status, other than to drugs and biological substances: Secondary | ICD-10-CM

## 2015-02-23 DIAGNOSIS — Z7902 Long term (current) use of antithrombotics/antiplatelets: Secondary | ICD-10-CM | POA: Diagnosis not present

## 2015-02-23 DIAGNOSIS — R569 Unspecified convulsions: Secondary | ICD-10-CM

## 2015-02-23 DIAGNOSIS — R197 Diarrhea, unspecified: Secondary | ICD-10-CM | POA: Diagnosis not present

## 2015-02-23 DIAGNOSIS — E860 Dehydration: Secondary | ICD-10-CM | POA: Diagnosis not present

## 2015-02-23 DIAGNOSIS — E039 Hypothyroidism, unspecified: Secondary | ICD-10-CM | POA: Diagnosis not present

## 2015-02-23 DIAGNOSIS — T501X5A Adverse effect of loop [high-ceiling] diuretics, initial encounter: Secondary | ICD-10-CM | POA: Diagnosis not present

## 2015-02-23 DIAGNOSIS — I252 Old myocardial infarction: Secondary | ICD-10-CM | POA: Diagnosis not present

## 2015-02-23 DIAGNOSIS — B9689 Other specified bacterial agents as the cause of diseases classified elsewhere: Secondary | ICD-10-CM | POA: Diagnosis not present

## 2015-02-23 DIAGNOSIS — J9601 Acute respiratory failure with hypoxia: Secondary | ICD-10-CM | POA: Diagnosis not present

## 2015-02-23 DIAGNOSIS — Z87891 Personal history of nicotine dependence: Secondary | ICD-10-CM

## 2015-02-23 DIAGNOSIS — J069 Acute upper respiratory infection, unspecified: Secondary | ICD-10-CM

## 2015-02-23 LAB — CBC WITH DIFFERENTIAL/PLATELET
BASOS ABS: 0 10*3/uL (ref 0.0–0.1)
Basophils Relative: 0 %
EOS PCT: 1 %
Eosinophils Absolute: 0 10*3/uL (ref 0.0–0.7)
HEMATOCRIT: 30 % — AB (ref 36.0–46.0)
HEMOGLOBIN: 10 g/dL — AB (ref 12.0–15.0)
LYMPHS ABS: 1.4 10*3/uL (ref 0.7–4.0)
LYMPHS PCT: 27 %
MCH: 27 pg (ref 26.0–34.0)
MCHC: 33.3 g/dL (ref 30.0–36.0)
MCV: 80.9 fL (ref 78.0–100.0)
Monocytes Absolute: 0.5 10*3/uL (ref 0.1–1.0)
Monocytes Relative: 9 %
NEUTROS ABS: 3.3 10*3/uL (ref 1.7–7.7)
NEUTROS PCT: 63 %
PLATELETS: 174 10*3/uL (ref 150–400)
RBC: 3.71 MIL/uL — AB (ref 3.87–5.11)
RDW: 13.9 % (ref 11.5–15.5)
WBC: 5.1 10*3/uL (ref 4.0–10.5)

## 2015-02-23 LAB — URINALYSIS, ROUTINE W REFLEX MICROSCOPIC
Bilirubin Urine: NEGATIVE
Glucose, UA: NEGATIVE mg/dL
Ketones, ur: NEGATIVE mg/dL
NITRITE: NEGATIVE
PH: 5.5 (ref 5.0–8.0)
Protein, ur: NEGATIVE mg/dL
SPECIFIC GRAVITY, URINE: 1.008 (ref 1.005–1.030)

## 2015-02-23 LAB — COMPREHENSIVE METABOLIC PANEL
ALK PHOS: 79 U/L (ref 38–126)
ALT: 12 U/L — AB (ref 14–54)
AST: 28 U/L (ref 15–41)
Albumin: 3.2 g/dL — ABNORMAL LOW (ref 3.5–5.0)
Anion gap: 11 (ref 5–15)
BUN: 23 mg/dL — AB (ref 6–20)
CALCIUM: 7.1 mg/dL — AB (ref 8.9–10.3)
CHLORIDE: 105 mmol/L (ref 101–111)
CO2: 18 mmol/L — ABNORMAL LOW (ref 22–32)
CREATININE: 1.89 mg/dL — AB (ref 0.44–1.00)
GFR, EST AFRICAN AMERICAN: 30 mL/min — AB (ref >60)
GFR, EST NON AFRICAN AMERICAN: 26 mL/min — AB (ref >60)
Glucose, Bld: 121 mg/dL — ABNORMAL HIGH (ref 65–99)
Potassium: 2.9 mmol/L — ABNORMAL LOW (ref 3.5–5.1)
Sodium: 134 mmol/L — ABNORMAL LOW (ref 135–145)
Total Bilirubin: 0.6 mg/dL (ref 0.3–1.2)
Total Protein: 6.5 g/dL (ref 6.5–8.1)

## 2015-02-23 LAB — URINE MICROSCOPIC-ADD ON

## 2015-02-23 LAB — MRSA PCR SCREENING: MRSA by PCR: NEGATIVE

## 2015-02-23 LAB — I-STAT TROPONIN, ED: Troponin i, poc: 0 ng/mL (ref 0.00–0.08)

## 2015-02-23 LAB — MAGNESIUM: Magnesium: 0.7 mg/dL — CL (ref 1.7–2.4)

## 2015-02-23 LAB — BRAIN NATRIURETIC PEPTIDE: B NATRIURETIC PEPTIDE 5: 95.6 pg/mL (ref 0.0–100.0)

## 2015-02-23 LAB — I-STAT CG4 LACTIC ACID, ED: Lactic Acid, Venous: 1.46 mmol/L (ref 0.5–2.0)

## 2015-02-23 MED ORDER — CALCITRIOL 0.5 MCG PO CAPS
0.5000 ug | ORAL_CAPSULE | Freq: Every day | ORAL | Status: DC
  Administered 2015-02-23 – 2015-02-26 (×4): 0.5 ug via ORAL
  Filled 2015-02-23 (×4): qty 1

## 2015-02-23 MED ORDER — ONDANSETRON HCL 4 MG PO TABS: 4.0000 mg | ORAL_TABLET | Freq: Four times a day (QID) | ORAL | Status: DC | PRN

## 2015-02-23 MED ORDER — POTASSIUM CHLORIDE 10 MEQ/100ML IV SOLN
10.0000 meq | INTRAVENOUS | Status: AC
  Administered 2015-02-23 (×3): 10 meq via INTRAVENOUS
  Filled 2015-02-23 (×3): qty 100

## 2015-02-23 MED ORDER — CALCIUM CARBONATE ANTACID 500 MG PO CHEW
1.0000 | CHEWABLE_TABLET | Freq: Every day | ORAL | Status: DC
  Administered 2015-02-23 – 2015-02-26 (×4): 200 mg via ORAL
  Filled 2015-02-23 (×4): qty 1

## 2015-02-23 MED ORDER — SODIUM CHLORIDE 0.9 % IV BOLUS (SEPSIS)
500.0000 mL | Freq: Once | INTRAVENOUS | Status: AC
  Administered 2015-02-23: 500 mL via INTRAVENOUS

## 2015-02-23 MED ORDER — ROSUVASTATIN CALCIUM 20 MG PO TABS
20.0000 mg | ORAL_TABLET | Freq: Every day | ORAL | Status: DC
  Administered 2015-02-24 – 2015-02-25 (×2): 20 mg via ORAL
  Filled 2015-02-23 (×3): qty 1

## 2015-02-23 MED ORDER — PANTOPRAZOLE SODIUM 40 MG PO TBEC
40.0000 mg | DELAYED_RELEASE_TABLET | Freq: Two times a day (BID) | ORAL | Status: DC
  Administered 2015-02-23 – 2015-02-26 (×6): 40 mg via ORAL
  Filled 2015-02-23 (×6): qty 1

## 2015-02-23 MED ORDER — ACETAMINOPHEN 650 MG RE SUPP: 650.0000 mg | Freq: Four times a day (QID) | RECTAL | Status: DC | PRN

## 2015-02-23 MED ORDER — LEVOTHYROXINE SODIUM 100 MCG PO TABS
100.0000 ug | ORAL_TABLET | Freq: Every day | ORAL | Status: DC
  Administered 2015-02-24 – 2015-02-26 (×3): 100 ug via ORAL
  Filled 2015-02-23 (×3): qty 1

## 2015-02-23 MED ORDER — SODIUM CHLORIDE 0.9 % IV SOLN
INTRAVENOUS | Status: DC
  Administered 2015-02-23 (×2): via INTRAVENOUS

## 2015-02-23 MED ORDER — ONDANSETRON HCL 4 MG/2ML IJ SOLN: 4.0000 mg | Freq: Four times a day (QID) | INTRAMUSCULAR | Status: DC | PRN

## 2015-02-23 MED ORDER — ALPRAZOLAM 0.25 MG PO TABS
0.2500 mg | ORAL_TABLET | Freq: Every day | ORAL | Status: DC
  Administered 2015-02-23 – 2015-02-25 (×3): 0.25 mg via ORAL
  Filled 2015-02-23 (×3): qty 1

## 2015-02-23 MED ORDER — SODIUM CHLORIDE 0.9 % IV SOLN
2.0000 g | Freq: Once | INTRAVENOUS | Status: AC
  Administered 2015-02-23: 2 g via INTRAVENOUS
  Filled 2015-02-23: qty 20

## 2015-02-23 MED ORDER — HYDROMORPHONE HCL 1 MG/ML IJ SOLN: 0.5000 mg | INTRAMUSCULAR | Status: DC | PRN

## 2015-02-23 MED ORDER — SODIUM CHLORIDE 0.9 % IV BOLUS (SEPSIS)
500.0000 mL | Freq: Once | INTRAVENOUS | Status: AC
Start: 2015-02-23 — End: 2015-02-23
  Administered 2015-02-23: 500 mL via INTRAVENOUS

## 2015-02-23 MED ORDER — LOPERAMIDE HCL 1 MG/5ML PO LIQD
1.0000 mg | ORAL | Status: DC | PRN
  Filled 2015-02-23: qty 5

## 2015-02-23 MED ORDER — ACETAMINOPHEN 325 MG PO TABS
650.0000 mg | ORAL_TABLET | Freq: Four times a day (QID) | ORAL | Status: DC | PRN
  Administered 2015-02-24 – 2015-02-25 (×2): 650 mg via ORAL
  Filled 2015-02-23 (×2): qty 2

## 2015-02-23 MED ORDER — ALUM & MAG HYDROXIDE-SIMETH 200-200-20 MG/5ML PO SUSP: 30.0000 mL | Freq: Four times a day (QID) | ORAL | Status: DC | PRN

## 2015-02-23 MED ORDER — OXYCODONE HCL 5 MG PO TABS
5.0000 mg | ORAL_TABLET | ORAL | Status: DC | PRN
  Administered 2015-02-25: 5 mg via ORAL
  Filled 2015-02-23: qty 1

## 2015-02-23 MED ORDER — LEVETIRACETAM 500 MG PO TABS
500.0000 mg | ORAL_TABLET | Freq: Two times a day (BID) | ORAL | Status: DC
  Administered 2015-02-23 – 2015-02-26 (×6): 500 mg via ORAL
  Filled 2015-02-23 (×6): qty 1

## 2015-02-23 MED ORDER — ENOXAPARIN SODIUM 30 MG/0.3ML ~~LOC~~ SOLN
30.0000 mg | SUBCUTANEOUS | Status: DC
Start: 2015-02-23 — End: 2015-02-24
  Administered 2015-02-23: 30 mg via SUBCUTANEOUS
  Filled 2015-02-23: qty 0.3

## 2015-02-23 MED ORDER — ALBUTEROL SULFATE (2.5 MG/3ML) 0.083% IN NEBU
2.5000 mg | INHALATION_SOLUTION | RESPIRATORY_TRACT | Status: DC | PRN
  Administered 2015-02-24: 2.5 mg via RESPIRATORY_TRACT
  Filled 2015-02-23: qty 3

## 2015-02-23 MED ORDER — CLOPIDOGREL BISULFATE 75 MG PO TABS
75.0000 mg | ORAL_TABLET | Freq: Every day | ORAL | Status: DC
  Administered 2015-02-24 – 2015-02-26 (×3): 75 mg via ORAL
  Filled 2015-02-23 (×3): qty 1

## 2015-02-23 MED ORDER — ASPIRIN EC 81 MG PO TBEC
81.0000 mg | DELAYED_RELEASE_TABLET | Freq: Every day | ORAL | Status: DC
  Administered 2015-02-23 – 2015-02-26 (×4): 81 mg via ORAL
  Filled 2015-02-23 (×4): qty 1

## 2015-02-23 MED ORDER — ALBUTEROL SULFATE (2.5 MG/3ML) 0.083% IN NEBU
2.5000 mg | INHALATION_SOLUTION | Freq: Four times a day (QID) | RESPIRATORY_TRACT | Status: DC
  Administered 2015-02-23: 2.5 mg via RESPIRATORY_TRACT
  Filled 2015-02-23: qty 3

## 2015-02-23 MED ORDER — MAGNESIUM SULFATE 2 GM/50ML IV SOLN
2.0000 g | Freq: Once | INTRAVENOUS | Status: AC
  Administered 2015-02-23: 2 g via INTRAVENOUS
  Filled 2015-02-23: qty 50

## 2015-02-23 MED ORDER — PAROXETINE HCL 20 MG PO TABS
30.0000 mg | ORAL_TABLET | Freq: Every day | ORAL | Status: DC
  Administered 2015-02-23 – 2015-02-26 (×4): 30 mg via ORAL
  Filled 2015-02-23 (×4): qty 2

## 2015-02-23 MED ORDER — BENZONATATE 100 MG PO CAPS
100.0000 mg | ORAL_CAPSULE | Freq: Three times a day (TID) | ORAL | Status: DC | PRN
  Filled 2015-02-23: qty 1

## 2015-02-23 NOTE — ED Notes (Signed)
Bed: WA08 Expected date:  Expected time:  Means of arrival:  Comments: EMS- Gen weakness

## 2015-02-23 NOTE — ED Notes (Signed)
Pt reports she does not need to urinate. Will try after fluids are completed to inquire about a urine sample.

## 2015-02-23 NOTE — ED Provider Notes (Signed)
CSN: [id number]     Arrival date & time 02/23/15  1658 History   First MD Initiated Contact with Patient 02/23/15 1705     Chief Complaint  Patient presents with  . Weakness  . Cough     (Consider location/radiation/quality/duration/timing/severity/associated sxs/prior Treatment) HPI Patient presents with generalized weakness for 5 days. She's had cough with some green sputum production. She's had intermittent low-grade fever. Sensation is also had multiple episodes of diarrhea but that is improved today. She has some right-sided chest pain especially with deep breathing and palpation. Was started by her primary physician on Z-Pak but has yet to take the medication. EMS was called to transport due to inability to ambulate. Past Medical History  Diagnosis Date  . Irritable bowel syndrome   . Hypertension   . Depression   . Hypothyroidism   . Internal hemorrhoids   . Anxiety   . Heart murmur   . Hyperlipidemia   . Vitamin D deficiency   . Chronic low back pain   . Carpal tunnel syndrome   . History of non-ST elevation myocardial infarction (NSTEMI)     2011--  S/P PCI WITH SENTING  . S/P drug eluting coronary stent placement     X3  in 2011, 2012, 2014  . GERD (gastroesophageal reflux disease)   . History of hiatal hernia   . Partial seizure disorder (HCC) NEUROLOGIST-- DR WILLIS    NOCTURNAL  . History of supraventricular tachycardia     2011-- resolved  . History of esophageal dilatation     FOR STRIUCTURE  . Gross hematuria   . Arthritis   . Cervical spondylosis without myelopathy   . S/P pericardial cyst excision     02-05-2013  benign  . Coronary artery disease CARDIOLOGIST-  DR Irish Lack    hx NSTEMI inferoposterior 2011 s/p PCI  total x3 DES's  . History of gout   . PONV (postoperative nausea and vomiting)     hard time getting iv site-had to do neck stick 2 yrs ago  . Anginal pain ([place])     over yr last time  . Shortness of breath dyspnea   . History of kidney  stones   . Seizures ([place])     none in yrs  . Pulmonary fibrosis [facility])    Past Surgical History  Procedure Laterality Date  . Mediasternotomy N/A 02/05/2013    Procedure: MEDIAN STERNOTOMY;  Surgeon: Ivin Poot, MD;  Location: [facility] OR;  Service: Thoracic;  Laterality: N/A;  . Biopsy of mediastinal mass N/A 02/05/2013    Procedure: RESECTION OF MEDIASTINAL MASS;  Surgeon: Ivin Poot, MD;  Location: [facility] OR;  Service: Thoracic;  Laterality: N/A;  . Ectopic pregnancy surgery  YRS AGO    SALPINGECTOMY  . Coronary angiogram  03/10/2011    Procedure: CORONARY ANGIOGRAM;  Surgeon: Jettie Booze, MD;  Location: [facility] CATH LAB;  Service: Cardiovascular;;  . Left heart catheterization with coronary angiogram N/A 03/14/2012    Procedure: LEFT HEART CATHETERIZATION WITH CORONARY ANGIOGRAM;  Surgeon: Sueanne Margarita, MD;  Location: [place] CATH LAB;  Service: Cardiovascular;  Laterality: N/A;  Normal LM,  50% pLAD,  70% mLAD,  D2 50-70%, very tortuous LAD,  70-82% ostial LCFX,  50% in-stent restenosis of  dRCA and mRCA  stent and 50-70% ostial PDA,  normal LVSF, ef 60%  . Percutaneous coronary stent intervention (pci-s) N/A 04/03/2012    Procedure: PERCUTANEOUS CORONARY STENT INTERVENTION (PCI-S);  Surgeon: Jettie Booze, MD;  Location: [employer] CATH LAB;  Service: Cardiovascular;  Laterality: N/A;   Successful PCI  mLAD with 2.75x12 Promus stent, postdilated to >0.83mm  . Coronary angioplasty with stent placement  11-22-2009  dr Irish Lack    Acute inferoposterior MI/  ef 60%,  PCI with DES x1 to dRCA,  25%  mLAD  . Coronary angioplasty with stent placement  06-26-2010  dr Daneen Schick    Cutting balloon angioplasty to dRCA with DES x1,  40% mLAD (non-obstructive CAD)  . Transthoracic echocardiogram  11-17-2011    grade I diastolic dysfunction/  ef 55-60%  . Cardiovascular stress test  11-22-2013  dr Irish Lack    normal lexiscan study/  no ischemia/  normal LVF and wall motion/ ef 81%  .  Esophagogastroduodenoscopy (egd) with esophageal dilation  05-20-2011  . Colonoscopy  last one 2014  . Needle guided excision breast calcifications Right 08-09-2008  . Cystoscopy with retrograde pyelogram, ureteroscopy and stent placement Right 02/13/2014    Procedure: CYSTOSCOPY WITH RETROGRADE PYELOGRAM, URETEROSCOPY AND STENT PLACEMENT;  Surgeon: Bernestine Amass, MD;  Location: [facility];  Service: Urology;  Laterality: Right;  . Holmium laser application Right [id number]    Procedure: HOLMIUM LASER APPLICATION;  Surgeon: Bernestine Amass, MD;  Location: [facility];  Service: Urology;  Laterality: Right;  . Esophagogastroduodenoscopy N/A 02/15/2014    Procedure: ESOPHAGOGASTRODUODENOSCOPY (EGD);  Surgeon: Jerene Bears, MD;  Location: [facility];  Service: Endoscopy;  Laterality: N/A;  . Lung biopsy Right 01/24/2015    Procedure: RIGHT LUNG BIOPSY;  Surgeon: Ivin Poot, MD;  Location: [place];  Service: Thoracic;  Laterality: Right;  . Video bronchoscopy N/A 01/24/2015    Procedure: RIGHT VIDEO BRONCHOSCOPY;  Surgeon: Ivin Poot, MD;  Location: [facility] OR;  Service: Thoracic;  Laterality: N/A;  . Cardiac catheterization Right 01/24/2015    Procedure: right femoral ARTERIAL LINE INSERTION;  Surgeon: Ivin Poot, MD;  Location: [place];  Service: Thoracic;  Laterality: Right;  . Thoracotomy Right 01/24/2015    Procedure: RIGHT MINI/LIMITED THORACOTOMY;  Surgeon: Ivin Poot, MD;  Location: [facility] OR;  Service: Thoracic;  Laterality: Right;   Family History  Problem Relation Age of Onset  . Breast cancer Sister   . Cancer Sister     breast  . Breast cancer      niece  . Heart disease Father   . Hypertension Father   . Emphysema Father   . Coronary artery disease Father   . Colon cancer Neg Hx   . Heart attack Mother   . CVA Mother    Social History  Substance Use Topics  . Smoking status: Former Smoker -- 0.30 packs/day for 3 years    Types: Cigarettes     Start date: 11/08/1964    Quit date: 11/09/1967  . Smokeless tobacco: Never Used  . Alcohol Use: No   OB History    No data available     Review of Systems  Constitutional: Positive for fever, activity change, appetite change and fatigue.  HENT: Positive for congestion.   Respiratory: Positive for shortness of breath and stridor.   Cardiovascular: Positive for chest pain.  Gastrointestinal: Positive for diarrhea. Negative for nausea, vomiting and abdominal pain.  Neurological: Positive for dizziness, weakness and light-headedness.  All other systems reviewed and are negative.     Allergies  Tape  Home Medications   Prior to Admission medications   Medication Sig Start Date End Date Taking? Authorizing  Provider  ALPRAZolam (XANAX) 0.25 MG tablet Take 0.25 mg by mouth at bedtime.   Yes Historical Provider, MD  aspirin EC 81 MG tablet Take 81 mg by mouth daily.   Yes Historical Provider, MD  calcitRIOL (ROCALTROL) 0.5 MCG capsule Take 0.5 mcg by mouth daily.   Yes Historical Provider, MD  calcium carbonate (TUMS - DOSED IN MG ELEMENTAL CALCIUM) 500 MG chewable tablet Chew 1 tablet by mouth daily.   Yes Historical Provider, MD  clopidogrel (PLAVIX) 75 MG tablet Take 1 tablet (75 mg total) by mouth daily with breakfast. 07/18/14  Yes Sueanne Margarita, MD  diltiazem (MATZIM LA) 240 MG 24 hr tablet Take 240 mg by mouth every morning.    Yes Historical Provider, MD  doxazosin (CARDURA) 2 MG tablet TAKE 1 TABLET (2 MG TOTAL) BY MOUTH DAILY. 01/29/15  Yes Sueanne Margarita, MD  furosemide (LASIX) 20 MG tablet Take 1 tablet (20 mg total) by mouth every other day. 12/02/14  Yes Sueanne Margarita, MD  HYDROcodone-acetaminophen (NORCO) 10-325 MG tablet Take 1 tablet by mouth every 6 (six) hours as needed for moderate pain.   Yes Historical Provider, MD  isosorbide mononitrate (IMDUR) 60 MG 24 hr tablet TAKE 1 AND 1/2 TABLETS BY MOUTH EVERY DAY 12/24/14  Yes Sueanne Margarita, MD  levETIRAcetam (KEPPRA)  500 MG tablet Take 1 tablet (500 mg total) by mouth 2 (two) times daily. 07/29/14  Yes Ward Givens, NP  levothyroxine (SYNTHROID, LEVOTHROID) 100 MCG tablet Take 100 mcg by mouth daily before breakfast.   Yes Historical Provider, MD  loperamide (IMODIUM) 1 MG/5ML solution Take 1 mg by mouth as needed for diarrhea or loose stools.   Yes Historical Provider, MD  losartan (COZAAR) 100 MG tablet Take 100 mg by mouth every morning.    Yes Historical Provider, MD  metoprolol succinate (TOPROL-XL) 50 MG 24 hr tablet Take 50 mg by mouth daily. Take with or immediately following a meal.   Yes Historical Provider, MD  nitroGLYCERIN (NITROSTAT) 0.4 MG SL tablet Place 1 tablet (0.4 mg total) under the tongue every 5 (five) minutes as needed for chest pain. 06/27/14  Yes Sueanne Margarita, MD  ondansetron (ZOFRAN) 8 MG tablet Take 4-8 mg by mouth every 6 (six) hours as needed for nausea or vomiting.   Yes Historical Provider, MD  pantoprazole (PROTONIX) 40 MG tablet TAKE 1 TABLET (40 MG TOTAL) BY MOUTH 2 (TWO) TIMES DAILY. 10/30/14  Yes Irene Shipper, MD  PARoxetine (PAXIL) 30 MG tablet Take 30 mg by mouth daily. 11/17/13  Yes Historical Provider, MD  Phenylephrine-DM-GG (MUCINEX FAST-MAX CONGEST COUGH) 2.5-5-100 MG/5ML LIQD Take 20 mLs by mouth every 4 (four) hours as needed (for cough and congestion).   Yes Historical Provider, MD  potassium chloride SA (K-DUR,KLOR-CON) 20 MEQ tablet Take 2 tablets (40 mEq total) by mouth every other day. Take with Lasix. Patient taking differently: Take 20 mEq by mouth every other day. Take with Lasix. 12/02/14  Yes Sueanne Margarita, MD  rosuvastatin (CRESTOR) 20 MG tablet Take 1 tablet (20 mg total) by mouth every morning. 11/20/14  Yes Sueanne Margarita, MD   BP 100/57 mmHg  Pulse 60  Temp(Src) 97.7 F (36.5 C) (Oral)  Resp 24  Ht 4\' 11"  (1.499 m)  Wt 140 lb (63.504 kg)  BMI 28.26 kg/m2  SpO2 92% Physical Exam  Constitutional: She is oriented to person, place, and time. She  appears well-developed. No distress.  Patient is  very lethargic  HENT:  Head: Normocephalic and atraumatic.  Mouth/Throat: Oropharynx is clear and moist.  Dry mucous membranes  Eyes: EOM are normal. Pupils are equal, round, and reactive to light.  Neck: Normal range of motion. Neck supple. No JVD present.  No meningismus  Cardiovascular: Normal rate and regular rhythm.   Pulmonary/Chest: Effort normal. No respiratory distress. She has no wheezes. She has rales. She exhibits tenderness (text the palpation over the right chest. There is no crepitance or deformity.).  Rales in right mid lung field.  Abdominal: Soft. Bowel sounds are normal. She exhibits no distension and no mass. There is no tenderness. There is no rebound and no guarding.  Musculoskeletal: Normal range of motion. She exhibits no edema or tenderness.  Neurological: She is alert and oriented to person, place, and time.  Skin: Skin is warm and dry. No rash noted. No erythema.  Psychiatric: She has a normal mood and affect. Her behavior is normal.  Nursing note and vitals reviewed.   ED Course  Procedures (including critical care time) Labs Review Labs Reviewed  CBC WITH DIFFERENTIAL/PLATELET - Abnormal; Notable for the following:    RBC 3.71 (*)    Hemoglobin 10.0 (*)    HCT 30.0 (*)    All other components within normal limits  COMPREHENSIVE METABOLIC PANEL - Abnormal; Notable for the following:    Sodium 134 (*)    Potassium 2.9 (*)    CO2 18 (*)    Glucose, Bld 121 (*)    BUN 23 (*)    Creatinine, Ser 1.89 (*)    Calcium 7.1 (*)    Albumin 3.2 (*)    ALT 12 (*)    GFR calc non Af Amer 26 (*)    GFR calc Af Amer 30 (*)    All other components within normal limits  CULTURE, BLOOD (ROUTINE X 2)  CULTURE, BLOOD (ROUTINE X 2)  BRAIN NATRIURETIC PEPTIDE  URINALYSIS, ROUTINE W REFLEX MICROSCOPIC (NOT AT [facility])  Randolm Idol, ED  I-STAT CG4 LACTIC ACID, ED    Imaging Review Dg Chest Port 1  View  02/23/2015  CLINICAL DATA:  Shortness of breath for 4 days EXAM: PORTABLE CHEST 1 VIEW COMPARISON:  02/19/2015 FINDINGS: Cardiac shadow is stable. Elevation of the right hemidiaphragm is again seen. Minimal platelike atelectasis is noted bilaterally stable from the previous exam. No sizable effusion or infiltrate is noted. Postsurgical changes are noted. The bony structures are within normal limits. IMPRESSION: No change from the prior study. Electronically Signed   By: Inez Catalina M.D.   On: 02/23/2015 18:05   I have personally reviewed and evaluated these images and lab results as part of my medical decision-making.   EKG Interpretation   Date/Time:  Sunday February 23 2015 17:28:00 EST Ventricular Rate:  63 PR Interval:  138 QRS Duration: 92 QT Interval:  451 QTC Calculation: 462 R Axis:   -52 Text Interpretation:  Age not entered, assumed to be  70 years old for  purpose of ECG interpretation Sinus rhythm Left anterior fascicular block  Low voltage, precordial leads Confirmed by Lita Mains  MD, Raine Blodgett ([id number]) on  02/23/2015 8:12:57 PM      MDM   Final diagnoses:  Generalized weakness  Dehydration  Hypokalemia  URI (upper respiratory infection)    Patient is now more alert. Blood pressure has improved with IV fluids. Initiating potassium replacement. Discussed with Dr. Arnoldo Morale and will admit patient to step down bed.    Shanon Brow  Lita Mains, MD 02/23/15 2013

## 2015-02-23 NOTE — H&P (Signed)
[facility] Admission History and Physical       [name] [id number] DOB: 1945-08-07 DOA: 02/23/2015  Referring physician: EDP PCP: Reginia Naas, MD  Specialists:   Chief Complaint: Increased Weakness  HPI: [name] is a 70 y.o. female with a history of ILD, CAD. HTN, Seizures and Hypothyroid who presents to the ED with complaints of increased weakness and diarrhea for the past 5 days.    She has had URI symptoms of cough, fever and chills and increased SOB and had been seen by her PCP 3 days ago and had a Flu test which was negative and was given an Rx for Azithromycin but she did not have it filled.    She has taken OTC Immodium for the past 24 hours and has not had any diarrheal symptoms today.    She was evaluated in the ED and was found to have hypotension, and hypokalemia and and increase in her BUN/Cr .       Review of Systems:  Constitutional: No Weight Loss, No Weight Gain, Night Sweats, +Fevers, +Chills, Dizziness, Light Headedness, Fatigue, or Generalized Weakness HEENT: No Headaches, Difficulty Swallowing,Tooth/Dental Problems,Sore Throat,  No Sneezing, Rhinitis, Ear Ache, Nasal Congestion, or Post Nasal Drip,  Cardio-vascular:  No Chest pain, Orthopnea, PND, Edema in Lower Extremities, Anasarca, Dizziness, Palpitations  Resp: No Dyspnea, No DOE, +Productive Cough, No Non-Productive Cough, No Hemoptysis, No Wheezing.    GI: No Heartburn, Indigestion, Abdominal Pain, Nausea, Vomiting, +Diarrhea, Constipation, Hematemesis, Hematochezia, Melena, Change in Bowel Habits,  Loss of Appetite  GU: No Dysuria, No Change in Color of Urine, No Urgency or Urinary Frequency, No Flank pain.  Musculoskeletal: No Joint Pain or Swelling, No Decreased Range of Motion, No Back Pain.  Neurologic: No Syncope, No Seizures, Muscle Weakness, Paresthesia, Vision Disturbance or Loss, No Diplopia, No Vertigo, No Difficulty Walking,  Skin: No Rash or Lesions. Psych: No  Change in Mood or Affect, No Depression or Anxiety, No Memory loss, No Confusion, or Hallucinations   Past Medical History  Diagnosis Date  . Irritable bowel syndrome   . Hypertension   . Depression   . Hypothyroidism   . Internal hemorrhoids   . Anxiety   . Heart murmur   . Hyperlipidemia   . Vitamin D deficiency   . Chronic low back pain   . Carpal tunnel syndrome   . History of non-ST elevation myocardial infarction (NSTEMI)     2011--  S/P PCI WITH SENTING  . S/P drug eluting coronary stent placement     X3  in 2011, 2012, 2014  . GERD (gastroesophageal reflux disease)   . History of hiatal hernia   . Partial seizure disorder (HCC) NEUROLOGIST-- DR WILLIS    NOCTURNAL  . History of supraventricular tachycardia     2011-- resolved  . History of esophageal dilatation     FOR STRIUCTURE  . Gross hematuria   . Arthritis   . Cervical spondylosis without myelopathy   . S/P pericardial cyst excision     02-05-2013  benign  . Coronary artery disease CARDIOLOGIST-  DR Irish Lack    hx NSTEMI inferoposterior 2011 s/p PCI  total x3 DES's  . History of gout   . PONV (postoperative nausea and vomiting)     hard time getting iv site-had to do neck stick 2 yrs ago  . Anginal pain ([place])     over yr last time  . Shortness of breath dyspnea   . History of  kidney stones   . Seizures ([place])     none in yrs  . Pulmonary fibrosis [facility])      Past Surgical History  Procedure Laterality Date  . Mediasternotomy N/A 02/05/2013    Procedure: MEDIAN STERNOTOMY;  Surgeon: Ivin Poot, MD;  Location: [facility] OR;  Service: Thoracic;  Laterality: N/A;  . Biopsy of mediastinal mass N/A 02/05/2013    Procedure: RESECTION OF MEDIASTINAL MASS;  Surgeon: Ivin Poot, MD;  Location: [facility] OR;  Service: Thoracic;  Laterality: N/A;  . Ectopic pregnancy surgery  YRS AGO    SALPINGECTOMY  . Coronary angiogram  03/10/2011    Procedure: CORONARY ANGIOGRAM;  Surgeon: Jettie Booze, MD;  Location: [facility]  CATH LAB;  Service: Cardiovascular;;  . Left heart catheterization with coronary angiogram N/A 03/14/2012    Procedure: LEFT HEART CATHETERIZATION WITH CORONARY ANGIOGRAM;  Surgeon: Sueanne Margarita, MD;  Location: [place] CATH LAB;  Service: Cardiovascular;  Laterality: N/A;  Normal LM,  50% pLAD,  70% mLAD,  D2 50-70%, very tortuous LAD,  70-82% ostial LCFX,  50% in-stent restenosis of  dRCA and mRCA  stent and 50-70% ostial PDA,  normal LVSF, ef 60%  . Percutaneous coronary stent intervention (pci-s) N/A 04/03/2012    Procedure: PERCUTANEOUS CORONARY STENT INTERVENTION (PCI-S);  Surgeon: Jettie Booze, MD;  Location: [facility] CATH LAB;  Service: Cardiovascular;  Laterality: N/A;   Successful PCI  mLAD with 2.75x12 Promus stent, postdilated to >0.62mm  . Coronary angioplasty with stent placement  11-22-2009  dr Irish Lack    Acute inferoposterior MI/  ef 60%,  PCI with DES x1 to dRCA,  25%  mLAD  . Coronary angioplasty with stent placement  06-26-2010  dr Daneen Schick    Cutting balloon angioplasty to dRCA with DES x1,  40% mLAD (non-obstructive CAD)  . Transthoracic echocardiogram  11-17-2011    grade I diastolic dysfunction/  ef 55-60%  . Cardiovascular stress test  11-22-2013  dr Irish Lack    normal lexiscan study/  no ischemia/  normal LVF and wall motion/ ef 81%  . Esophagogastroduodenoscopy (egd) with esophageal dilation  05-20-2011  . Colonoscopy  last one 2014  . Needle guided excision breast calcifications Right 08-09-2008  . Cystoscopy with retrograde pyelogram, ureteroscopy and stent placement Right 02/13/2014    Procedure: CYSTOSCOPY WITH RETROGRADE PYELOGRAM, URETEROSCOPY AND STENT PLACEMENT;  Surgeon: Bernestine Amass, MD;  Location: [facility];  Service: Urology;  Laterality: Right;  . Holmium laser application Right [id number]    Procedure: HOLMIUM LASER APPLICATION;  Surgeon: Bernestine Amass, MD;  Location: [facility];  Service: Urology;  Laterality: Right;  .  Esophagogastroduodenoscopy N/A 02/15/2014    Procedure: ESOPHAGOGASTRODUODENOSCOPY (EGD);  Surgeon: Jerene Bears, MD;  Location: [facility];  Service: Endoscopy;  Laterality: N/A;  . Lung biopsy Right 01/24/2015    Procedure: RIGHT LUNG BIOPSY;  Surgeon: Ivin Poot, MD;  Location: [place];  Service: Thoracic;  Laterality: Right;  . Video bronchoscopy N/A 01/24/2015    Procedure: RIGHT VIDEO BRONCHOSCOPY;  Surgeon: Ivin Poot, MD;  Location: [facility] OR;  Service: Thoracic;  Laterality: N/A;  . Cardiac catheterization Right 01/24/2015    Procedure: right femoral ARTERIAL LINE INSERTION;  Surgeon: Ivin Poot, MD;  Location: [place];  Service: Thoracic;  Laterality: Right;  . Thoracotomy Right 01/24/2015    Procedure: RIGHT MINI/LIMITED THORACOTOMY;  Surgeon: Ivin Poot, MD;  Location: [place];  Service: Thoracic;  Laterality:  Right;      Prior to Admission medications   Medication Sig Start Date End Date Taking? Authorizing Provider  ALPRAZolam (XANAX) 0.25 MG tablet Take 0.25 mg by mouth at bedtime.   Yes Historical Provider, MD  aspirin EC 81 MG tablet Take 81 mg by mouth daily.   Yes Historical Provider, MD  calcitRIOL (ROCALTROL) 0.5 MCG capsule Take 0.5 mcg by mouth daily.   Yes Historical Provider, MD  calcium carbonate (TUMS - DOSED IN MG ELEMENTAL CALCIUM) 500 MG chewable tablet Chew 1 tablet by mouth daily.   Yes Historical Provider, MD  clopidogrel (PLAVIX) 75 MG tablet Take 1 tablet (75 mg total) by mouth daily with breakfast. 07/18/14  Yes Sueanne Margarita, MD  diltiazem (MATZIM LA) 240 MG 24 hr tablet Take 240 mg by mouth every morning.    Yes Historical Provider, MD  doxazosin (CARDURA) 2 MG tablet TAKE 1 TABLET (2 MG TOTAL) BY MOUTH DAILY. 01/29/15  Yes Sueanne Margarita, MD  furosemide (LASIX) 20 MG tablet Take 1 tablet (20 mg total) by mouth every other day. 12/02/14  Yes Sueanne Margarita, MD  HYDROcodone-acetaminophen (NORCO) 10-325 MG tablet Take 1 tablet by mouth every 6 (six)  hours as needed for moderate pain.   Yes Historical Provider, MD  isosorbide mononitrate (IMDUR) 60 MG 24 hr tablet TAKE 1 AND 1/2 TABLETS BY MOUTH EVERY DAY 12/24/14  Yes Sueanne Margarita, MD  levETIRAcetam (KEPPRA) 500 MG tablet Take 1 tablet (500 mg total) by mouth 2 (two) times daily. 07/29/14  Yes Ward Givens, NP  levothyroxine (SYNTHROID, LEVOTHROID) 100 MCG tablet Take 100 mcg by mouth daily before breakfast.   Yes Historical Provider, MD  loperamide (IMODIUM) 1 MG/5ML solution Take 1 mg by mouth as needed for diarrhea or loose stools.   Yes Historical Provider, MD  losartan (COZAAR) 100 MG tablet Take 100 mg by mouth every morning.    Yes Historical Provider, MD  metoprolol succinate (TOPROL-XL) 50 MG 24 hr tablet Take 50 mg by mouth daily. Take with or immediately following a meal.   Yes Historical Provider, MD  nitroGLYCERIN (NITROSTAT) 0.4 MG SL tablet Place 1 tablet (0.4 mg total) under the tongue every 5 (five) minutes as needed for chest pain. 06/27/14  Yes Sueanne Margarita, MD  ondansetron (ZOFRAN) 8 MG tablet Take 4-8 mg by mouth every 6 (six) hours as needed for nausea or vomiting.   Yes Historical Provider, MD  pantoprazole (PROTONIX) 40 MG tablet TAKE 1 TABLET (40 MG TOTAL) BY MOUTH 2 (TWO) TIMES DAILY. 10/30/14  Yes Irene Shipper, MD  PARoxetine (PAXIL) 30 MG tablet Take 30 mg by mouth daily. 11/17/13  Yes Historical Provider, MD  Phenylephrine-DM-GG (MUCINEX FAST-MAX CONGEST COUGH) 2.5-5-100 MG/5ML LIQD Take 20 mLs by mouth every 4 (four) hours as needed (for cough and congestion).   Yes Historical Provider, MD  potassium chloride SA (K-DUR,KLOR-CON) 20 MEQ tablet Take 2 tablets (40 mEq total) by mouth every other day. Take with Lasix. Patient taking differently: Take 20 mEq by mouth every other day. Take with Lasix. 12/02/14  Yes Sueanne Margarita, MD  rosuvastatin (CRESTOR) 20 MG tablet Take 1 tablet (20 mg total) by mouth every morning. 11/20/14  Yes Sueanne Margarita, MD     Allergies    Allergen Reactions  . Tape Rash    ? Clear tape    Social History:  reports that she quit smoking about 47 years ago. Her smoking use  included Cigarettes. She started smoking about 50 years ago. She has a .9 pack-year smoking history. She has never used smokeless tobacco. She reports that she does not drink alcohol or use illicit drugs.    Family History  Problem Relation Age of Onset  . Breast cancer Sister   . Cancer Sister     breast  . Breast cancer      niece  . Heart disease Father   . Hypertension Father   . Emphysema Father   . Coronary artery disease Father   . Colon cancer Neg Hx   . Heart attack Mother   . CVA Mother        Physical Exam:  GEN:  Pleasant Obese ill Appearing  69 y.o. Caucasian female examined and in no acute distress; cooperative with exam Filed Vitals:   02/23/15 1830 02/23/15 1900 02/23/15 1930 02/23/15 2000  BP: 94/67 90/57 100/57 105/64  Pulse: 62 58 60 63  Temp:      TempSrc:      Resp: 25 13 24 19   Height:      Weight:      SpO2: 92% 94% 92% 93%   Blood pressure 105/64, pulse 63, temperature 97.7 F (36.5 C), temperature source Oral, resp. rate 19, height 4\' 11"  (1.499 m), weight 63.504 kg (140 lb), SpO2 93 %. PSYCH: She is alert and oriented x4; does not appear anxious does not appear depressed; affect is normal HEENT: Normocephalic and Atraumatic, Mucous membranes pink; PERRLA; EOM intact; Fundi:  Benign;  No scleral icterus, Nares: Patent, Oropharynx: Clear, Fair Dentition,    Neck:  FROM, No Cervical Lymphadenopathy nor Thyromegaly or Carotid Bruit; No JVD; Breasts:: Not examined CHEST WALL: No tenderness CHEST: Normal respiration, clear to auscultation bilaterally HEART: Regular rate and rhythm; no murmurs rubs or gallops BACK: No kyphosis or scoliosis; No CVA tenderness ABDOMEN: Positive Bowel Sounds, Obese, Soft Non-Tender, No Rebound or Guarding; No Masses, No Organomegaly, No Pannus; No Intertriginous candida. Rectal Exam:  Not done EXTREMITIES: No Cyanosis, Clubbing, or Edema; No Ulcerations. Genitalia: not examined PULSES: 2+ and symmetric SKIN: Normal hydration no rash or ulceration CNS:  Alert and Oriented x 4, No Focal Deficits Vascular: pulses palpable throughout    Labs on Admission:  Basic Metabolic Panel:  Recent Labs Lab 02/23/15 1747  NA 134*  K 2.9*  CL 105  CO2 18*  GLUCOSE 121*  BUN 23*  CREATININE 1.89*  CALCIUM 7.1*   Liver Function Tests:  Recent Labs Lab 02/23/15 1747  AST 28  ALT 12*  ALKPHOS 79  BILITOT 0.6  PROT 6.5  ALBUMIN 3.2*   No results for input(s): LIPASE, AMYLASE in the last 168 hours. No results for input(s): AMMONIA in the last 168 hours. CBC:  Recent Labs Lab 02/23/15 1747  WBC 5.1  NEUTROABS 3.3  HGB 10.0*  HCT 30.0*  MCV 80.9  PLT 174   Cardiac Enzymes: No results for input(s): CKTOTAL, CKMB, CKMBINDEX, TROPONINI in the last 168 hours.  BNP (last 3 results)  Recent Labs  10/15/14 0311 02/23/15 1747  BNP 193.2* 95.6    ProBNP (last 3 results) No results for input(s): PROBNP in the last 8760 hours.  CBG: No results for input(s): GLUCAP in the last 168 hours.  Radiological Exams on Admission: Dg Chest Port 1 View  02/23/2015  CLINICAL DATA:  Shortness of breath for 4 days EXAM: PORTABLE CHEST 1 VIEW COMPARISON:  02/19/2015 FINDINGS: Cardiac shadow is stable. Elevation of the right  hemidiaphragm is again seen. Minimal platelike atelectasis is noted bilaterally stable from the previous exam. No sizable effusion or infiltrate is noted. Postsurgical changes are noted. The bony structures are within normal limits. IMPRESSION: No change from the prior study. Electronically Signed   By: Inez Catalina M.D.   On: 02/23/2015 18:05     EKG: Independently reviewed. Normal sinus rhythm rate = 63    Assessment/Plan:   70 y.o. female with  Principal Problem:    1.   Generalized weakness - Most Likely due to Electrolyte Abnormalities,  Hypotension, and Viral Illness    IVFs    Correct electrolytes   Active Problems:     2.   Hypotension    IVFs    Hold Anti-hypertensives      3.   Hypokalemia    IV KCl    Check Magnesium      4.   Hypocalcemia    Replace Calcium    Monitor Calcium      5.   AKI (acute kidney injury) ([place])    IVFs    Monitor BUN/Cr      6.   Acute diarrhea    Monitor , NO further episodes after Imodium Rx    Send Stool C+S and C.diff PCR if recurrs         7.   Acute bronchitis    Albuterol Nebs PRN    O2 PRN      8.   ILD (interstitial lung disease) (HCC)    O2 PRN      9.   Coronary atherosclerosis    Continue Plavix and ASA and Crestor Rx      10.  Convulsions/seizures ([place])    Continue Keppra Rx     11.  Hypothyroid    Continue Levothyroxine Rx     12. DVT Prophylaxis    Lovenox        Code Status:     FULL CODE      Family Communication:   Husband at Bedside     Disposition Plan:    Inpatient Status with Expected LOS 2 -3 days     Time spent: [facility] Pager [phone]   If [place] Please Contact the Day Rounding Team MD for [facility]  If 7PM-7AM, Please Contact Night-Floor Coverage  [email or web address] Password [facility] 02/23/2015, 8:29 PM     ADDENDUM:   Patient was seen and examined on 02/23/2015

## 2015-02-23 NOTE — ED Notes (Signed)
Patient is from home and transported via [facility]. Pt is complaining of generalized weakness since Wednesday. Pt has seen PCP for symptoms. PCP called in a Z-pack today but has not took any of the medication. Pt was unable to ambulate with EMS. Appears weak, lung sounds clear, and had orthostatic changes per EMS. Denies any fever.

## 2015-02-24 DIAGNOSIS — N179 Acute kidney failure, unspecified: Secondary | ICD-10-CM | POA: Diagnosis not present

## 2015-02-24 DIAGNOSIS — R569 Unspecified convulsions: Secondary | ICD-10-CM | POA: Diagnosis not present

## 2015-02-24 DIAGNOSIS — R531 Weakness: Secondary | ICD-10-CM | POA: Diagnosis not present

## 2015-02-24 DIAGNOSIS — J849 Interstitial pulmonary disease, unspecified: Secondary | ICD-10-CM

## 2015-02-24 DIAGNOSIS — J9621 Acute and chronic respiratory failure with hypoxia: Secondary | ICD-10-CM

## 2015-02-24 DIAGNOSIS — E876 Hypokalemia: Secondary | ICD-10-CM

## 2015-02-24 DIAGNOSIS — R197 Diarrhea, unspecified: Secondary | ICD-10-CM | POA: Diagnosis not present

## 2015-02-24 LAB — BASIC METABOLIC PANEL
ANION GAP: 10 (ref 5–15)
BUN: 19 mg/dL (ref 6–20)
CHLORIDE: 113 mmol/L — AB (ref 101–111)
CO2: 19 mmol/L — ABNORMAL LOW (ref 22–32)
Calcium: 7.5 mg/dL — ABNORMAL LOW (ref 8.9–10.3)
Creatinine, Ser: 1.52 mg/dL — ABNORMAL HIGH (ref 0.44–1.00)
GFR, EST AFRICAN AMERICAN: 40 mL/min — AB (ref >60)
GFR, EST NON AFRICAN AMERICAN: 34 mL/min — AB (ref >60)
Glucose, Bld: 106 mg/dL — ABNORMAL HIGH (ref 65–99)
POTASSIUM: 3.9 mmol/L (ref 3.5–5.1)
SODIUM: 142 mmol/L (ref 135–145)

## 2015-02-24 LAB — CBC
HEMATOCRIT: 27.6 % — AB (ref 36.0–46.0)
Hemoglobin: 8.7 g/dL — ABNORMAL LOW (ref 12.0–15.0)
MCH: 26.9 pg (ref 26.0–34.0)
MCHC: 31.5 g/dL (ref 30.0–36.0)
MCV: 85.4 fL (ref 78.0–100.0)
Platelets: 156 10*3/uL (ref 150–400)
RBC: 3.23 MIL/uL — AB (ref 3.87–5.11)
RDW: 14.2 % (ref 11.5–15.5)
WBC: 4 10*3/uL (ref 4.0–10.5)

## 2015-02-24 MED ORDER — VANCOMYCIN HCL IN DEXTROSE 750-5 MG/150ML-% IV SOLN
750.0000 mg | INTRAVENOUS | Status: DC
  Administered 2015-02-25: 750 mg via INTRAVENOUS
  Filled 2015-02-24: qty 150

## 2015-02-24 MED ORDER — VANCOMYCIN HCL IN DEXTROSE 1-5 GM/200ML-% IV SOLN
1000.0000 mg | INTRAVENOUS | Status: AC
  Administered 2015-02-24: 1000 mg via INTRAVENOUS
  Filled 2015-02-24: qty 200

## 2015-02-24 MED ORDER — IPRATROPIUM BROMIDE 0.02 % IN SOLN
0.5000 mg | RESPIRATORY_TRACT | Status: DC | PRN
  Administered 2015-02-25: 0.5 mg via RESPIRATORY_TRACT
  Filled 2015-02-24: qty 2.5

## 2015-02-24 MED ORDER — METOPROLOL SUCCINATE ER 50 MG PO TB24
50.0000 mg | ORAL_TABLET | Freq: Every day | ORAL | Status: DC
  Administered 2015-02-24 – 2015-02-26 (×3): 50 mg via ORAL
  Filled 2015-02-24 (×3): qty 1

## 2015-02-24 MED ORDER — LEVALBUTEROL HCL 1.25 MG/0.5ML IN NEBU
1.2500 mg | INHALATION_SOLUTION | Freq: Three times a day (TID) | RESPIRATORY_TRACT | Status: DC
  Administered 2015-02-25 – 2015-02-26 (×4): 1.25 mg via RESPIRATORY_TRACT
  Filled 2015-02-24 (×7): qty 0.5

## 2015-02-24 MED ORDER — GUAIFENESIN-DM 100-10 MG/5ML PO SYRP
5.0000 mL | ORAL_SOLUTION | ORAL | Status: DC | PRN
  Administered 2015-02-24 (×2): 5 mL via ORAL
  Filled 2015-02-24 (×2): qty 10

## 2015-02-24 MED ORDER — LEVALBUTEROL HCL 0.63 MG/3ML IN NEBU
0.6300 mg | INHALATION_SOLUTION | RESPIRATORY_TRACT | Status: DC | PRN
  Administered 2015-02-25: 0.63 mg via RESPIRATORY_TRACT
  Filled 2015-02-24: qty 3

## 2015-02-24 MED ORDER — DILTIAZEM HCL ER COATED BEADS 240 MG PO TB24
240.0000 mg | ORAL_TABLET | Freq: Every morning | ORAL | Status: DC
  Filled 2015-02-24 (×2): qty 1

## 2015-02-24 MED ORDER — IPRATROPIUM-ALBUTEROL 0.5-2.5 (3) MG/3ML IN SOLN: 3.0000 mL | Freq: Four times a day (QID) | RESPIRATORY_TRACT | Status: DC

## 2015-02-24 MED ORDER — ISOSORBIDE MONONITRATE ER 30 MG PO TB24
90.0000 mg | ORAL_TABLET | Freq: Every day | ORAL | Status: DC
  Administered 2015-02-24 – 2015-02-26 (×3): 90 mg via ORAL
  Filled 2015-02-24 (×3): qty 3

## 2015-02-24 MED ORDER — PREDNISONE 50 MG PO TABS
50.0000 mg | ORAL_TABLET | Freq: Every day | ORAL | Status: DC
  Administered 2015-02-24 – 2015-02-26 (×3): 50 mg via ORAL
  Filled 2015-02-24: qty 1
  Filled 2015-02-24: qty 2
  Filled 2015-02-24: qty 1

## 2015-02-24 MED ORDER — DILTIAZEM HCL ER COATED BEADS 240 MG PO CP24
240.0000 mg | ORAL_CAPSULE | Freq: Every day | ORAL | Status: DC
  Administered 2015-02-24 – 2015-02-26 (×3): 240 mg via ORAL
  Filled 2015-02-24 (×2): qty 1

## 2015-02-24 MED ORDER — ALBUTEROL SULFATE (2.5 MG/3ML) 0.083% IN NEBU: 2.5000 mg | INHALATION_SOLUTION | Freq: Four times a day (QID) | RESPIRATORY_TRACT | Status: DC

## 2015-02-24 MED ORDER — MAGNESIUM SULFATE 2 GM/50ML IV SOLN
2.0000 g | Freq: Once | INTRAVENOUS | Status: AC
  Administered 2015-02-24: 2 g via INTRAVENOUS
  Filled 2015-02-24: qty 50

## 2015-02-24 MED ORDER — IPRATROPIUM BROMIDE 0.02 % IN SOLN: 0.5000 mg | Freq: Four times a day (QID) | RESPIRATORY_TRACT | Status: DC

## 2015-02-24 MED ORDER — LEVALBUTEROL HCL 1.25 MG/0.5ML IN NEBU
1.2500 mg | INHALATION_SOLUTION | Freq: Four times a day (QID) | RESPIRATORY_TRACT | Status: DC
  Administered 2015-02-24 (×2): 1.25 mg via RESPIRATORY_TRACT
  Filled 2015-02-24 (×4): qty 0.5

## 2015-02-24 NOTE — Progress Notes (Addendum)
Patient ID: [name], female   DOB: 06-10-1945, 70 y.o.   MRN: [id number] [facility] PROGRESS NOTE  [name] [id number] DOB: 11/27/1945 DOA: March 09, 2015 PCP: Jasmine Naas, MD  Brief narrative:    70 y.o. female with past medical history significant for interstitial lung disease, coronary artery disease, hypertension, seizure disorder, depression, dyslipidemia. She presented to [facility] hospital with reports of increasing generalized weakness for past couple of days prior to this admission. She also had upper respiratory tract symptoms such as cough, fevers chills and shortness of breath. She had a recent flu test which was negative and she was given azithromycin however she never to get because she presented to ER. She also had reports of diarrhea which now had subsided.  On admission, blood pressure was 90/54, respiratory rate 25. Blood work was notable for hemoglobin of 10, potassium 2.9 and creatinine 1.89. Lactic acid was within normal limits. Chest x-ray showed no significant changes from prior studies.  Stable for transfer to telemetry floor today.  Assessment/Plan:    Principal Problem:   Generalized weakness - Unclear etiology. She feels little bit better. Will get physical therapy for further evaluation.  Active Problems:   Coronary atherosclerosis, native artery without angina pectoris - No reports of chest pain - Continue aspirin and Plavix    Hypothyroidism - Continue Synthroid    Convulsions/seizures (HCC) - Continue Keppra    ILD (interstitial lung disease) ([place]) / acute respiratory failure with hypoxia - Will add Xopenex every 6 hours scheduled and every 2 hours as needed for shortness of breath or wheezing. Patient also has Atrovent every 6 hours scheduled and every 2 hours as needed. - Add prednisone 50 mg daily - No acute cardiopulmonary findings on chest x-ray    Hypokalemia - Secondary to Lasix. Supplements    Acute diarrhea -  Now resolved    Dyslipidemia - Continue Crestor      Depression  - Continue Paxil     Essential hypertension - Resume Cardizem, isosorbide, metoprolol. Hold losartan and Lasix because of renal insufficiency   Chronic kidney disease, stage III - Baseline creatinine about one month ago was 1.5 and on this admission just about around the baseline - Continue to hold Lasix and losartan until some improvement in Cr seen   DVT Prophylaxis  - Aspirin, Plavix and SCDs  Code Status: Full.  Family Communication:  plan of care discussed with the patient Disposition Plan: Home likely by 02/26/2015. Transfer to telemetry today  IV access:  Peripheral IV  Procedures and diagnostic studies:    Dg Chest [facility] 09-Mar-2015  No change from the prior study. Electronically Signed   By: [name] M.D.   On: 2015/03/09 18:05   Medical Consultants:  None   Other Consultants:  None   IAnti-Infectives:   None    Jasmine Lenz, MD  [facility] Pager [phone]  Time spent in minutes: 25 minutes  If 7PM-7AM, please contact night-coverage [email or web address] Password [facility] 02/24/2015, 8:52 AM   LOS: 1 day    HPI/Subjective: No acute overnight events. Says she feels little better this morning but still short of breath with exertion.  Objective: Filed Vitals:   03/09/2015 2300 02/24/15 0300 02/24/15 0405 02/24/15 0459  BP: 117/67 135/68    Pulse: 64 62    Temp: 97.8 F (36.6 C)   97.9 F (36.6 C)  TempSrc: Oral   Oral  Resp: 19 17    Height:  Weight:      SpO2: 99% 98% 99%     Intake/Output Summary (Last 24 hours) at 02/24/15 0852 Last data filed at 02/24/15 0846  Gross per 24 hour  Intake    240 ml  Output    500 ml  Net   -260 ml    Exam:   General:  Pt is alert, follows commands appropriately, not in acute distress  Cardiovascular: Regular rate and rhythm, S1/S2 (+)  Respiratory: Wheezing in upper and mid lung lobes, no rhonchi  Abdomen: Soft, non  tender, non distended, bowel sounds present  Extremities: No edema, pulses DP and PT palpable bilaterally  Neuro: Grossly nonfocal  Data Reviewed: Basic Metabolic Panel:  Recent Labs Lab 02/23/15 1747 02/24/15 0346  NA 134* 142  K 2.9* 3.9  CL 105 113*  CO2 18* 19*  GLUCOSE 121* 106*  BUN 23* 19  CREATININE 1.89* 1.52*  CALCIUM 7.1* 7.5*  MG 0.7*  --    Liver Function Tests:  Recent Labs Lab 02/23/15 1747  AST 28  ALT 12*  ALKPHOS 79  BILITOT 0.6  PROT 6.5  ALBUMIN 3.2*   No results for input(s): LIPASE, AMYLASE in the last 168 hours. No results for input(s): AMMONIA in the last 168 hours. CBC:  Recent Labs Lab 02/23/15 1747 02/24/15 0346  WBC 5.1 4.0  NEUTROABS 3.3  --   HGB 10.0* 8.7*  HCT 30.0* 27.6*  MCV 80.9 85.4  PLT 174 156   Cardiac Enzymes: No results for input(s): CKTOTAL, CKMB, CKMBINDEX, TROPONINI in the last 168 hours. BNP: Invalid input(s): POCBNP CBG: No results for input(s): GLUCAP in the last 168 hours.  Recent Results (from the past 240 hour(s))  MRSA PCR Screening     Status: None   Collection Time: 02/23/15  9:12 PM  Result Value Ref Range Status   MRSA by PCR NEGATIVE NEGATIVE Final     Scheduled Meds: . albuterol  2.5 mg Nebulization Q6H  . ALPRAZolam  0.25 mg Oral QHS  . aspirin EC  81 mg Oral Daily  . calcitRIOL  0.5 mcg Oral Daily  . calcium carbonate  1 tablet Oral Daily  . clopidogrel  75 mg Oral Q breakfast  . ipratropium  0.5 mg Nebulization Q6H  . levETIRAcetam  500 mg Oral BID  . levothyroxine  100 mcg Oral QAC breakfast  . pantoprazole  40 mg Oral BID  . PARoxetine  30 mg Oral Daily  . predniSONE  50 mg Oral Q breakfast  . rosuvastatin  20 mg Oral q1800   Continuous Infusions: . sodium chloride 75 mL/hr at 02/23/15 2223

## 2015-02-24 NOTE — Progress Notes (Signed)
Pharmacy Antibiotic Note  [name] is a 70 y.o. female admitted on 02/23/2015 with generalized weakness.  She now has GPC in only 1 of 2 blood cultures, concerning for bacteremia, or possibly a contaminant.  Pharmacy has been consulted for Vancomycin dosing.  Plan:  Vancomycin 1g IV stat, then 750mg  IV every 24 hours.  Goal trough 15-20 mcg/mL.   Measure Vanc trough at steady state.  Follow up renal fxn, culture results, and clinical course    Height: 4\' 11"  (149.9 cm) Weight: 140 lb (63.504 kg) IBW/kg (Calculated) : 43.2  Temp (24hrs), Avg:98 F (36.7 C), Min:97.5 F (36.4 C), Max:98.8 F (37.1 C)   Recent Labs Lab 02/23/15 1747 02/23/15 1807 02/24/15 0346  WBC 5.1  --  4.0  CREATININE 1.89*  --  1.52*  LATICACIDVEN  --  1.46  --     Estimated Creatinine Clearance: 28.7 mL/min (by C-G formula based on Cr of 1.52).    Allergies  Allergen Reactions  . Tape Rash    ? Clear tape    Antimicrobials this admission: 2/13 >> Vancomycin >>  Levels/dose changes this admission:  Microbiology Results: 2/12 BCx: 1/2 NGTD, 1/2 GPC clusters (anaerobic bottle only) 2/12 MRSA PCR: negative  Thank you for allowing pharmacy to be a part of this patient's care.  Gretta Arab PharmD, BCPS Pager [phone] 02/24/2015 6:34 PM

## 2015-02-24 NOTE — Evaluation (Signed)
Physical Therapy Evaluation Patient Details Name: [name] MRN: [id number] DOB: 1945-09-20 Today's Date: 02/24/2015   History of Present Illness  70 y.o. female with h/o CAD, HTN, HLD, pericardial cyst c/p sternotomy, depression, interstitial lung disease admitted with generalized weakness, cough, SOB, diarrhea.   Clinical Impression  Pt admitted with above diagnosis. Pt currently with functional limitations due to the deficits listed below (see PT Problem List). Pt ambulated 60' with min hand held assist for balance, SaO2 94% on RA with walking, 3/4 dyspnea.  Pt will benefit from skilled PT to increase their independence and safety with mobility to allow discharge to the venue listed below.      Follow Up Recommendations Home health PT    Equipment Recommendations  Rolling walker with 5" wheels    Recommendations for Other Services OT consult     Precautions / Restrictions Precautions Precautions: Fall Precaution Comments: denies h/o falls in past year Restrictions Weight Bearing Restrictions: No      Mobility  Bed Mobility Overal bed mobility: Modified Independent             General bed mobility comments: HOB up 30*  Transfers Overall transfer level: Needs assistance Equipment used: None Transfers: Sit to/from Stand Sit to Stand: Min guard         General transfer comment: min/guard for balance  Ambulation/Gait Ambulation/Gait assistance: Min assist Ambulation Distance (Feet): 60 Feet Assistive device: 1 person hand held assist Gait Pattern/deviations: Step-through pattern;Decreased stride length   Gait velocity interpretation: Below normal speed for age/gender General Gait Details: SaO2 94% on RA with ambulation, congested breath sounds 3/4 dyspnea noted with walking; at baseline pt ambulates without AD, initially upon walking she reached for handrail for support, so completed with with HHA of 1, recommend trial of RW next session for balance and  energy conservation  Stairs            Wheelchair Mobility    Modified Rankin (Stroke Patients Only)       Balance Overall balance assessment: Needs assistance   Sitting balance-Leahy Scale: Good       Standing balance-Leahy Scale: Fair                               Pertinent Vitals/Pain Pain Assessment: No/denies pain    Home Living Family/patient expects to be discharged to:: Private residence Living Arrangements: Spouse/significant other Available Help at Discharge: Family Type of Home: House Home Access: Stairs to enter Entrance Stairs-Rails: Right Entrance Stairs-Number of Steps: 6 Home Layout: One level Home Equipment: Hand held shower head;Shower seat;Other (comment) (oxygen) Additional Comments: wears O2 at night    Prior Function Level of Independence: Independent         Comments: pt prefers to get down in tub to bathe     Hand Dominance   Dominant Hand: Left    Extremity/Trunk Assessment   Upper Extremity Assessment: Overall WFL for tasks assessed           Lower Extremity Assessment: Overall WFL for tasks assessed      Cervical / Trunk Assessment: Normal  Communication   Communication: No difficulties  Cognition Arousal/Alertness: Awake/alert Behavior During Therapy: WFL for tasks assessed/performed Overall Cognitive Status: Within Functional Limits for tasks assessed                      General Comments      Exercises  Assessment/Plan    PT Assessment Patient needs continued PT services  PT Diagnosis Difficulty walking;Generalized weakness   PT Problem List Cardiopulmonary status limiting activity;Decreased balance;Decreased activity tolerance;Decreased mobility  PT Treatment Interventions DME instruction;Functional mobility training;Therapeutic activities;Stair training;Patient/family education;Therapeutic exercise;Balance training   PT Goals (Current goals can be found in the Care Plan  section) Acute Rehab PT Goals Patient Stated Goal: to do puzzle books PT Goal Formulation: With patient Time For Goal Achievement: 03/10/15 Potential to Achieve Goals: Fair    Frequency Min 3X/week   Barriers to discharge        Co-evaluation               End of Session Equipment Utilized During Treatment: Gait belt Activity Tolerance: Patient tolerated treatment well Patient left: in chair;with call bell/phone within reach Nurse Communication: Mobility status         Time: [id number] PT Time Calculation (min) (ACUTE ONLY): 18 min   Charges:   PT Evaluation $PT Eval Low Complexity: 1 Procedure     PT G CodesPhilomena Doheny 02/24/2015, 3:07 PM [phone]

## 2015-02-25 DIAGNOSIS — N179 Acute kidney failure, unspecified: Secondary | ICD-10-CM | POA: Diagnosis not present

## 2015-02-25 DIAGNOSIS — R569 Unspecified convulsions: Secondary | ICD-10-CM | POA: Diagnosis not present

## 2015-02-25 DIAGNOSIS — R531 Weakness: Secondary | ICD-10-CM | POA: Diagnosis not present

## 2015-02-25 DIAGNOSIS — J849 Interstitial pulmonary disease, unspecified: Secondary | ICD-10-CM | POA: Diagnosis not present

## 2015-02-25 DIAGNOSIS — R197 Diarrhea, unspecified: Secondary | ICD-10-CM | POA: Diagnosis not present

## 2015-02-25 DIAGNOSIS — E876 Hypokalemia: Secondary | ICD-10-CM | POA: Diagnosis not present

## 2015-02-25 LAB — BASIC METABOLIC PANEL
Anion gap: 11 (ref 5–15)
BUN: 11 mg/dL (ref 6–20)
CALCIUM: 8 mg/dL — AB (ref 8.9–10.3)
CO2: 19 mmol/L — ABNORMAL LOW (ref 22–32)
Chloride: 112 mmol/L — ABNORMAL HIGH (ref 101–111)
Creatinine, Ser: 1.14 mg/dL — ABNORMAL HIGH (ref 0.44–1.00)
GFR calc Af Amer: 56 mL/min — ABNORMAL LOW (ref >60)
GFR, EST NON AFRICAN AMERICAN: 48 mL/min — AB (ref >60)
GLUCOSE: 148 mg/dL — AB (ref 65–99)
Potassium: 3.3 mmol/L — ABNORMAL LOW (ref 3.5–5.1)
Sodium: 142 mmol/L (ref 135–145)

## 2015-02-25 LAB — CBC
HCT: 29.2 % — ABNORMAL LOW (ref 36.0–46.0)
Hemoglobin: 9.3 g/dL — ABNORMAL LOW (ref 12.0–15.0)
MCH: 26.9 pg (ref 26.0–34.0)
MCHC: 31.8 g/dL (ref 30.0–36.0)
MCV: 84.4 fL (ref 78.0–100.0)
PLATELETS: 156 10*3/uL (ref 150–400)
RBC: 3.46 MIL/uL — ABNORMAL LOW (ref 3.87–5.11)
RDW: 14.4 % (ref 11.5–15.5)
WBC: 4.2 10*3/uL (ref 4.0–10.5)

## 2015-02-25 LAB — MAGNESIUM: MAGNESIUM: 2 mg/dL (ref 1.7–2.4)

## 2015-02-25 MED ORDER — POTASSIUM CHLORIDE CRYS ER 20 MEQ PO TBCR
40.0000 meq | EXTENDED_RELEASE_TABLET | Freq: Once | ORAL | Status: AC
  Administered 2015-02-25: 40 meq via ORAL
  Filled 2015-02-25: qty 2

## 2015-02-25 NOTE — Progress Notes (Addendum)
Patient ID: [name], female   DOB: 1945-06-17, 70 y.o.   MRN: [id number] [facility] PROGRESS NOTE  SHARONLEE KUNDU [id number] DOB: 04-06-1945 DOA: 2015-02-26 PCP: Reginia Naas, MD  Brief narrative:    70 y.o. female with past medical history significant for interstitial lung disease, coronary artery disease, hypertension, seizure disorder, depression, dyslipidemia. She presented to [facility] hospital with reports of increasing generalized weakness for past couple of days prior to this admission. She also had upper respiratory tract symptoms such as cough, fevers chills and shortness of breath. She had a recent flu test which was negative and she was given azithromycin however she never to get because she presented to ER. She also had reports of diarrhea which now had subsided.  On admission, blood pressure was 90/54, respiratory rate 25. Blood work was notable for hemoglobin of 10, potassium 2.9 and creatinine 1.89. Lactic acid was within normal limits. Chest x-ray showed no significant changes from prior studies.  Transferred to telemetry 02/24/2015.  Assessment/Plan:    Principal Problem:   Generalized weakness - Feels better. PT recommended home health physical therapy  Active Problems:   Gram positive bacteremia - Preliminary report given one of the blood cultures gram-positive cocci in clusters, awaiting final report - Vancomycin started 02/24/2015 - If this turns out to be contamination we'll discontinue vancomycin      Coronary atherosclerosis, native artery without angina pectoris - No reports of chest pain - Continue aspirin and Plavix    Hypothyroidism - Continue Synthroid    Convulsions/seizures (HCC) - Continue Keppra - No reports of seizures    ILD (interstitial lung disease) ([place]) / acute respiratory failure with hypoxia - Stable respiratory status. Continue current nebulizer treatments with Xopenex and Atrovent 3 times daily scheduled and  then on as-needed basis for shortness of breath or wheezing - Add prednisone 50 mg daily - Chest x-ray with no cardiopulmonary findings    Hypokalemia - Secondary to Lasix. Supplemented    Acute diarrhea - Resolved    Dyslipidemia - Continue Crestor      Depression  - Continue Paxil  - Patient is stable, she does not appear to be depressed    Essential hypertension - Continue Cardizem, isosorbide, metoprolol   Chronic kidney disease, stage III - Baseline creatinine about one month ago was 1.5 and on this admission just about around the baseline - Creatinine further improving, 1.14 this morning. Likely improving while holding losartan and Lasix  DVT Prophylaxis  - Aspirin, Plavix and SCDs  Code Status: Full.  Family Communication:  plan of care discussed with the patient Disposition Plan: Home likely by 02/26/2015.   IV access:  Peripheral IV  Procedures and diagnostic studies:    Dg Chest Port 1 View February 26, 2015  No change from the prior study. Electronically Signed   By: Inez Catalina M.D.   On: 02/26/15 18:05   Medical Consultants:  None   Other Consultants:  None   IAnti-Infectives:   Vanco 02/24/2015 -->   Leisa Lenz, MD  [facility] Pager [phone]  Time spent in minutes: 25 minutes  If 7PM-7AM, please contact night-coverage [email or web address] Password [facility] 02/25/2015, 11:58 AM   LOS: 2 days    HPI/Subjective: No acute overnight events. Says she feels little better.  Objective: Filed Vitals:   02/24/15 1314 02/24/15 2036 02/25/15 0323 02/25/15 0629  BP:  139/90 148/81 166/85  Pulse:  73 77 84  Temp:  97.7 F (36.5 C) 97.6 F (36.4 C)  97.7 F (36.5 C)  TempSrc:  Oral Oral Oral  Resp:  18 18 18   Height:      Weight:      SpO2: 94% 97% 99% 100%    Intake/Output Summary (Last 24 hours) at 02/25/15 1158 Last data filed at 02/25/15 0700  Gross per 24 hour  Intake 1510.25 ml  Output      0 ml  Net 1510.25 ml    Exam:   General:   Pt is alert, not in acute distress  Cardiovascular: RRR, S1/S2 appreciated   Respiratory: No wheezing, no rhonchi  Abdomen: Appreciate bowel sounds, nontender  Extremities: No swelling, palpable pulses  Neuro: Nonfocal  Data Reviewed: Basic Metabolic Panel:  Recent Labs Lab 02/23/15 1747 02/24/15 0346 02/25/15 0524  NA 134* 142 142  K 2.9* 3.9 3.3*  CL 105 113* 112*  CO2 18* 19* 19*  GLUCOSE 121* 106* 148*  BUN 23* 19 11  CREATININE 1.89* 1.52* 1.14*  CALCIUM 7.1* 7.5* 8.0*  MG 0.7*  --  2.0   Liver Function Tests:  Recent Labs Lab 02/23/15 1747  AST 28  ALT 12*  ALKPHOS 79  BILITOT 0.6  PROT 6.5  ALBUMIN 3.2*   No results for input(s): LIPASE, AMYLASE in the last 168 hours. No results for input(s): AMMONIA in the last 168 hours. CBC:  Recent Labs Lab 02/23/15 1747 02/24/15 0346 02/25/15 0524  WBC 5.1 4.0 4.2  NEUTROABS 3.3  --   --   HGB 10.0* 8.7* 9.3*  HCT 30.0* 27.6* 29.2*  MCV 80.9 85.4 84.4  PLT 174 156 156   Cardiac Enzymes: No results for input(s): CKTOTAL, CKMB, CKMBINDEX, TROPONINI in the last 168 hours. BNP: Invalid input(s): POCBNP CBG: No results for input(s): GLUCAP in the last 168 hours.  Recent Results (from the past 240 hour(s))  MRSA PCR Screening     Status: None   Collection Time: 02/23/15  9:12 PM  Result Value Ref Range Status   MRSA by PCR NEGATIVE NEGATIVE Final     Scheduled Meds: . ALPRAZolam  0.25 mg Oral QHS  . aspirin EC  81 mg Oral Daily  . calcitRIOL  0.5 mcg Oral Daily  . calcium carbonate  1 tablet Oral Daily  . clopidogrel  75 mg Oral Q breakfast  . diltiazem  240 mg Oral Daily  . isosorbide mononitrate  90 mg Oral Daily  . levalbuterol  1.25 mg Nebulization TID  . levETIRAcetam  500 mg Oral BID  . levothyroxine  100 mcg Oral QAC breakfast  . metoprolol succinate  50 mg Oral Daily  . pantoprazole  40 mg Oral BID  . PARoxetine  30 mg Oral Daily  . potassium chloride  40 mEq Oral Once  . predniSONE   50 mg Oral Q breakfast  . rosuvastatin  20 mg Oral q1800  . vancomycin  750 mg Intravenous Q24H   Continuous Infusions: . sodium chloride 10 mL/hr at 02/24/15 [phone]

## 2015-02-25 NOTE — Progress Notes (Signed)
Spoke with pt concerning HHPT.  Pt states that she is not sure and would like to talk with her husband first. Will continue to follow pt.

## 2015-02-26 DIAGNOSIS — R569 Unspecified convulsions: Secondary | ICD-10-CM | POA: Diagnosis not present

## 2015-02-26 DIAGNOSIS — R197 Diarrhea, unspecified: Secondary | ICD-10-CM

## 2015-02-26 DIAGNOSIS — J849 Interstitial pulmonary disease, unspecified: Secondary | ICD-10-CM | POA: Diagnosis not present

## 2015-02-26 DIAGNOSIS — J9611 Chronic respiratory failure with hypoxia: Secondary | ICD-10-CM | POA: Diagnosis not present

## 2015-02-26 DIAGNOSIS — N179 Acute kidney failure, unspecified: Secondary | ICD-10-CM | POA: Diagnosis not present

## 2015-02-26 DIAGNOSIS — R531 Weakness: Secondary | ICD-10-CM | POA: Diagnosis not present

## 2015-02-26 DIAGNOSIS — R0600 Dyspnea, unspecified: Secondary | ICD-10-CM | POA: Diagnosis not present

## 2015-02-26 DIAGNOSIS — E876 Hypokalemia: Secondary | ICD-10-CM | POA: Diagnosis not present

## 2015-02-26 LAB — BASIC METABOLIC PANEL
ANION GAP: 6 (ref 5–15)
BUN: 12 mg/dL (ref 6–20)
CHLORIDE: 112 mmol/L — AB (ref 101–111)
CO2: 22 mmol/L (ref 22–32)
Calcium: 8.2 mg/dL — ABNORMAL LOW (ref 8.9–10.3)
Creatinine, Ser: 1.11 mg/dL — ABNORMAL HIGH (ref 0.44–1.00)
GFR calc Af Amer: 58 mL/min — ABNORMAL LOW (ref >60)
GFR calc non Af Amer: 50 mL/min — ABNORMAL LOW (ref >60)
Glucose, Bld: 126 mg/dL — ABNORMAL HIGH (ref 65–99)
POTASSIUM: 4.6 mmol/L (ref 3.5–5.1)
SODIUM: 140 mmol/L (ref 135–145)

## 2015-02-26 LAB — CULTURE, BLOOD (ROUTINE X 2)

## 2015-02-26 LAB — CBC
HEMATOCRIT: 28.7 % — AB (ref 36.0–46.0)
HEMOGLOBIN: 9.2 g/dL — AB (ref 12.0–15.0)
MCH: 27.4 pg (ref 26.0–34.0)
MCHC: 32.1 g/dL (ref 30.0–36.0)
MCV: 85.4 fL (ref 78.0–100.0)
Platelets: 188 10*3/uL (ref 150–400)
RBC: 3.36 MIL/uL — ABNORMAL LOW (ref 3.87–5.11)
RDW: 14.6 % (ref 11.5–15.5)
WBC: 8.6 10*3/uL (ref 4.0–10.5)

## 2015-02-26 MED ORDER — ALBUTEROL SULFATE HFA 108 (90 BASE) MCG/ACT IN AERS: 2.0000 | INHALATION_SPRAY | Freq: Four times a day (QID) | RESPIRATORY_TRACT | Status: DC | PRN

## 2015-02-26 MED ORDER — POTASSIUM CHLORIDE CRYS ER 20 MEQ PO TBCR: 20.0000 meq | EXTENDED_RELEASE_TABLET | ORAL | Status: DC

## 2015-02-26 NOTE — Progress Notes (Signed)
[facility]    [facility] is providing the following services: RW  If patient discharges after hours, please call [phone].   Linward Headland 02/26/2015, 3:36 PM

## 2015-02-26 NOTE — Discharge Summary (Signed)
Physician Discharge Summary  [name] [id number] DOB: 01-09-1946 DOA: 02/23/2015  PCP: Reginia Naas, MD  Admit date: 02/23/2015 Discharge date: 02/26/2015  Recommendations for Outpatient Follow-up:  1. Continue albuterol inhaler on discharge as needed   Discharge Diagnoses:  Principal Problem:   Generalized weakness Active Problems:   Coronary atherosclerosis   Hypothyroidism   Convulsions/seizures (HCC)   ILD (interstitial lung disease) (HCC)   Hypokalemia   AKI (acute kidney injury) (HCC)   Acute bronchitis   Acute diarrhea   Hypocalcemia   Hypotension   Weakness    Discharge Condition: stable   Diet recommendation: as tolerated   History of present illness:  70 y.o. female with past medical history significant for interstitial lung disease, coronary artery disease, hypertension, seizure disorder, depression, dyslipidemia. She presented to [facility] with reports of increasing generalized weakness for past couple of days prior to this admission. She also had upper respiratory tract symptoms such as cough, fevers chills and shortness of breath. She had a recent flu test which was negative and she was given azithromycin however she never to get because she presented to ER. She also had reports of diarrhea which now had subsided.  On admission, blood pressure was 90/54, respiratory rate 25. Blood work was notable for hemoglobin of 10, potassium 2.9 and creatinine 1.89. Lactic acid was within normal limits. Chest x-ray showed no significant changes from prior studies.  Transferred to telemetry 02/24/2015.  Hospital Course:   Assessment/Plan:    Principal Problem:  Generalized weakness - Feels better. PT recommended home health physical therapy, pt declined to have [facility] PT  Active Problems:  Gram positive bacteremia - Preliminary report given one of the blood cultures gram-positive cocci in clusters , it is coagulase-negative sepsis is most  likely contaminant - Vancomycin started 02/24/2015. Stop today    Coronary atherosclerosis, native artery without angina pectoris - Continue aspirin and Plavix   Hypothyroidism - Continue Synthroid   Convulsions/seizures (HCC) - Continue Keppra   ILD (interstitial lung disease) ([place]) / acute respiratory failure with hypoxia - Prescribed inhaler on as-needed basis on discharge. No rhonchi or wheezing on physical exam so will stop prednisone today.   Hypokalemia - Secondary to Lasix. Supplemented   Acute diarrhea - Resolved   Dyslipidemia - Continue Crestor    Depression  - Continue Paxil    Essential hypertension - Continue Cardizem, isosorbide, metoprolol  Chronic kidney disease, stage III - Baseline creatinine about one month ago was 1.5 and on this admission just about around the baseline - Creatinine even better this morning 1.11  DVT Prophylaxis  - Aspirin, Plavix on discharge   Code Status: Full.  Family Communication: plan of care discussed with the patient   IV access:  Peripheral IV  Procedures and diagnostic studies:   Dg Chest Port 1 View 02/23/2015 No change from the prior study. Electronically Signed By: Inez Catalina M.D. On: 02/23/2015 18:05   Medical Consultants:  None   Other Consultants:  None   IAnti-Infectives:   Vanco 02/24/2015 --> 02/26/2015  Signed:  Leisa Lenz, MD  [facility] 02/26/2015, 12:15 PM  Pager #: [phone]  Time spent in minutes: more than 30 minutes  Discharge Exam: Filed Vitals:   02/25/15 2137 02/26/15 0612  BP: 144/63 148/82  Pulse: 70 68  Temp: 97.8 F (36.6 C) 97.2 F (36.2 C)  Resp: 18 18   Filed Vitals:   02/25/15 1800 02/25/15 2050 02/25/15 2137 02/26/15 0612  BP: 142/88  144/63 148/82  Pulse: 66  70 68  Temp: 98.1 F (36.7 C)  97.8 F (36.6 C) 97.2 F (36.2 C)  TempSrc: Oral  Oral Oral  Resp: 17  18 18   Height:      Weight:      SpO2: 97% 93%  98% 100%    General: Pt is alert, follows commands appropriately, not in acute distress Cardiovascular: Regular rate and rhythm, S1/S2 +, no murmurs Respiratory: Clear to auscultation bilaterally, no wheezing, no crackles, no rhonchi Abdominal: Soft, non tender, non distended, bowel sounds +, no guarding Extremities: no edema, no cyanosis, pulses palpable bilaterally DP and PT Neuro: Grossly nonfocal  Discharge Instructions  Discharge Instructions    Call MD for:  difficulty breathing, headache or visual disturbances    Complete by:  As directed      Call MD for:  persistant dizziness or light-headedness    Complete by:  As directed      Call MD for:  persistant nausea and vomiting    Complete by:  As directed      Call MD for:  severe uncontrolled pain    Complete by:  As directed      Diet - low sodium heart healthy    Complete by:  As directed      Increase activity slowly    Complete by:  As directed             Medication List    STOP taking these medications        HYDROcodone-acetaminophen 10-325 MG tablet  Commonly known as:  NORCO      TAKE these medications        albuterol 108 (90 Base) MCG/ACT inhaler  Commonly known as:  PROVENTIL HFA;VENTOLIN HFA  Inhale 2 puffs into the lungs every 6 (six) hours as needed for wheezing or shortness of breath.     ALPRAZolam 0.25 MG tablet  Commonly known as:  XANAX  Take 0.25 mg by mouth at bedtime.     aspirin EC 81 MG tablet  Take 81 mg by mouth daily.     calcitRIOL 0.5 MCG capsule  Commonly known as:  ROCALTROL  Take 0.5 mcg by mouth daily.     calcium carbonate 500 MG chewable tablet  Commonly known as:  TUMS - dosed in mg elemental calcium  Chew 1 tablet by mouth daily.     clopidogrel 75 MG tablet  Commonly known as:  PLAVIX  Take 1 tablet (75 mg total) by mouth daily with breakfast.     doxazosin 2 MG tablet  Commonly known as:  CARDURA  TAKE 1 TABLET (2 MG TOTAL) BY MOUTH DAILY.     furosemide  20 MG tablet  Commonly known as:  LASIX  Take 1 tablet (20 mg total) by mouth every other day.     isosorbide mononitrate 60 MG 24 hr tablet  Commonly known as:  IMDUR  TAKE 1 AND 1/2 TABLETS BY MOUTH EVERY DAY     levETIRAcetam 500 MG tablet  Commonly known as:  KEPPRA  Take 1 tablet (500 mg total) by mouth 2 (two) times daily.     levothyroxine 100 MCG tablet  Commonly known as:  SYNTHROID, LEVOTHROID  Take 100 mcg by mouth daily before breakfast.     loperamide 1 MG/5ML solution  Commonly known as:  IMODIUM  Take 1 mg by mouth as needed for diarrhea or loose stools.     losartan 100 MG  tablet  Commonly known as:  COZAAR  Take 100 mg by mouth every morning.     MATZIM LA 240 MG 24 hr tablet  Generic drug:  diltiazem  Take 240 mg by mouth every morning.     metoprolol succinate 50 MG 24 hr tablet  Commonly known as:  TOPROL-XL  Take 50 mg by mouth daily. Take with or immediately following a meal.     MUCINEX FAST-MAX CONGEST COUGH 2.5-5-100 MG/5ML Liqd  Generic drug:  Phenylephrine-DM-GG  Take 20 mLs by mouth every 4 (four) hours as needed (for cough and congestion).     nitroGLYCERIN 0.4 MG SL tablet  Commonly known as:  NITROSTAT  Place 1 tablet (0.4 mg total) under the tongue every 5 (five) minutes as needed for chest pain.     ondansetron 8 MG tablet  Commonly known as:  ZOFRAN  Take 4-8 mg by mouth every 6 (six) hours as needed for nausea or vomiting.     pantoprazole 40 MG tablet  Commonly known as:  PROTONIX  TAKE 1 TABLET (40 MG TOTAL) BY MOUTH 2 (TWO) TIMES DAILY.     PARoxetine 30 MG tablet  Commonly known as:  PAXIL  Take 30 mg by mouth daily.     potassium chloride SA 20 MEQ tablet  Commonly known as:  K-DUR,KLOR-CON  Take 1 tablet (20 mEq total) by mouth every other day. Take with Lasix.     rosuvastatin 20 MG tablet  Commonly known as:  CRESTOR  Take 1 tablet (20 mg total) by mouth every morning.           Follow-up Information    Follow  up with Reginia Naas, MD. Schedule an appointment as soon as possible for a visit in 2 weeks.   Specialty:  Family Medicine   Why:  Follow up appt after recent hospitalization   Contact information:   [address] [place] [id number] [phone]        The results of significant diagnostics from this hospitalization (including imaging, microbiology, ancillary and laboratory) are listed below for reference.    Significant Diagnostic Studies: Dg Chest 2 View  02/19/2015  CLINICAL DATA:  Shortness of breath. Chest pain. Former smoker. Coronary artery disease. Interstitial lung disease. Lung biopsy in January 2017. EXAM: CHEST  2 VIEW COMPARISON:  01/27/2015 FINDINGS: Improved basilar it and lingular airspace opacities with currently only mild scarring or atelectasis medially in the right lung base, in the lingula, and in the right perihilar region. This represents a significant improvement. Prior median sternotomy. Mildly elevated right hemidiaphragm. Right seventh rib discontinuity laterally, possibly from osteotomy or prior fracture. No pleural effusion observed. Thoracic spondylosis. IMPRESSION: 1. Significantly improved opacities, with only mild residual scarring or subsegmental atelectasis as noted above. 2. Mildly elevated right hemidiaphragm. 3. Right lateral seventh rib discontinuity compatible with osteotomy or fracture, stable. Electronically Signed   By: Van Clines M.D.   On: 02/19/2015 11:19   Dg Chest Port 1 View  02/23/2015  CLINICAL DATA:  Shortness of breath for 4 days EXAM: PORTABLE CHEST 1 VIEW COMPARISON:  02/19/2015 FINDINGS: Cardiac shadow is stable. Elevation of the right hemidiaphragm is again seen. Minimal platelike atelectasis is noted bilaterally stable from the previous exam. No sizable effusion or infiltrate is noted. Postsurgical changes are noted. The bony structures are within normal limits. IMPRESSION: No change from the prior study.  Electronically Signed   By: Inez Catalina M.D.   On:  02/23/2015 18:05    Microbiology: Culture, blood (Routine X 2) w Reflex to ID Panel     Status: None   Collection Time: 02/23/15  5:49 PM  Result Value Ref Range Status   Specimen Description BLOOD BLOOD RIGHT HAND  Final   Special Requests BOTTLES DRAWN AEROBIC AND ANAEROBIC 5CC  Final   Culture  Setup Time   Final   Culture   Final    STAPHYLOCOCCUS SPECIES (COAGULASE NEGATIVE)   Report Status 02/26/2015 FINAL  Final  Culture, blood (Routine X 2) w Reflex to ID Panel     Status: None (Preliminary result)   Collection Time: 02/23/15  5:56 PM  Result Value Ref Range Status   Specimen Description BLOOD LEFT ANTECUBITAL  Final   Culture   Final    NO GROWTH 2 DAYS Performed at [facility]    Report Status PENDING  Incomplete  MRSA PCR Screening     Status: None   Collection Time: 02/23/15  9:12 PM  Result Value Ref Range Status   MRSA by PCR NEGATIVE NEGATIVE Final     Labs: Basic Metabolic Panel:  Recent Labs Lab 02/23/15 1747 02/24/15 0346 02/25/15 0524 02/26/15 0501  NA 134* 142 142 140  K 2.9* 3.9 3.3* 4.6  CL 105 113* 112* 112*  CO2 18* 19* 19* 22  GLUCOSE 121* 106* 148* 126*  BUN 23* 19 11 12   CREATININE 1.89* 1.52* 1.14* 1.11*  CALCIUM 7.1* 7.5* 8.0* 8.2*  MG 0.7*  --  2.0  --    Liver Function Tests:  Recent Labs Lab 02/23/15 1747  AST 28  ALT 12*  ALKPHOS 79  BILITOT 0.6  PROT 6.5  ALBUMIN 3.2*   No results for input(s): LIPASE, AMYLASE in the last 168 hours. No results for input(s): AMMONIA in the last 168 hours. CBC:  Recent Labs Lab 02/23/15 1747 02/24/15 0346 02/25/15 0524 02/26/15 0501  WBC 5.1 4.0 4.2 8.6  NEUTROABS 3.3  --   --   --   HGB 10.0* 8.7* 9.3* 9.2*  HCT 30.0* 27.6* 29.2* 28.7*  MCV 80.9 85.4 84.4 85.4  PLT 174 156 156 188   Cardiac Enzymes: No results for input(s): CKTOTAL, CKMB, CKMBINDEX, TROPONINI in the last 168 hours. BNP: BNP (last 3  results)  Recent Labs  10/15/14 0311 02/23/15 1747  BNP 193.2* 95.6    ProBNP (last 3 results) No results for input(s): PROBNP in the last 8760 hours.  CBG: No results for input(s): GLUCAP in the last 168 hours.

## 2015-02-26 NOTE — Care Management Important Message (Signed)
Important Message  Patient Details  Name: YOLANI SUDLER MRN: [id number] Date of Birth: [birth year]   Medicare Important Message Given:  Yes    Camillo Flaming 02/26/2015, 11:30 AMImportant Message  Patient Details  Name: BERNARDETTE TRUJEQUE MRN: [id number] Date of Birth: 1945-04-19   Medicare Important Message Given:  Yes    Camillo Flaming 02/26/2015, 11:30 AM

## 2015-02-26 NOTE — Discharge Instructions (Signed)
Albuterol inhalation aerosol What is this medicine? ALBUTEROL (al [place]) is a bronchodilator. It helps open up the airways in your lungs to make it easier to breathe. This medicine is used to treat and to prevent bronchospasm. This medicine may be used for other purposes; ask your health care provider or pharmacist if you have questions. What should I tell my health care provider before I take this medicine? They need to know if you have any of the following conditions: -diabetes -heart disease or irregular heartbeat -high blood pressure -pheochromocytoma -seizures -thyroid disease -an unusual or allergic reaction to albuterol, levalbuterol, sulfites, other medicines, foods, dyes, or preservatives -pregnant or trying to get pregnant -breast-feeding How should I use this medicine? This medicine is for inhalation through the mouth. Follow the directions on your prescription label. Take your medicine at regular intervals. Do not use more often than directed. Make sure that you are using your inhaler correctly. Ask you doctor or health care provider if you have any questions. Talk to your pediatrician regarding the use of this medicine in children. Special care may be needed. Overdosage: If you think you have taken too much of this medicine contact a poison control center or emergency room at once. NOTE: This medicine is only for you. Do not share this medicine with others. What if I miss a dose? If you miss a dose, use it as soon as you can. If it is almost time for your next dose, use only that dose. Do not use double or extra doses. What may interact with this medicine? -anti-infectives like chloroquine and pentamidine -caffeine -cisapride -diuretics -medicines for colds -medicines for depression or for emotional or psychotic conditions -medicines for weight loss including some herbal products -methadone -some antibiotics like clarithromycin, erythromycin, levofloxacin, and  linezolid -some heart medicines -steroid hormones like dexamethasone, cortisone, hydrocortisone -theophylline -thyroid hormones This list may not describe all possible interactions. Give your health care provider a list of all the medicines, herbs, non-prescription drugs, or dietary supplements you use. Also tell them if you smoke, drink alcohol, or use illegal drugs. Some items may interact with your medicine. What should I watch for while using this medicine? Tell your doctor or health care professional if your symptoms do not improve. Do not use extra albuterol. If your asthma or bronchitis gets worse while you are using this medicine, call your doctor right away. If your mouth gets dry try chewing sugarless gum or sucking hard candy. Drink water as directed. What side effects may I notice from receiving this medicine? Side effects that you should report to your doctor or health care professional as soon as possible: -allergic reactions like skin rash, itching or hives, swelling of the face, lips, or tongue -breathing problems -chest pain -feeling faint or lightheaded, falls -high blood pressure -irregular heartbeat -fever -muscle cramps or weakness -pain, tingling, numbness in the hands or feet -vomiting Side effects that usually do not require medical attention (report to your doctor or health care professional if they continue or are bothersome): -cough -difficulty sleeping -headache -nervousness or trembling -stomach upset -stuffy or runny nose -throat irritation -unusual taste This list may not describe all possible side effects. Call your doctor for medical advice about side effects. You may report side effects to FDA at 1-800-FDA-1088. Where should I keep my medicine? Keep out of the reach of children. Store at room temperature between 15 and 30 degrees C (59 and 86 degrees F). The contents are under pressure and  may burst when exposed to heat or flame. Do not freeze. This  medicine does not work as well if it is too cold. Throw away any unused medicine after the expiration date. Inhalers need to be thrown away after the labeled number of puffs have been used or by the expiration date; whichever comes first. Ventolin HFA should be thrown away 12 months after removing from foil pouch. Check the instructions that come with your medicine. NOTE: This sheet is a summary. It may not cover all possible information. If you have questions about this medicine, talk to your doctor, pharmacist, or health care provider.    2016, [employer]. (2012-06-15 10:57:17)

## 2015-02-27 ENCOUNTER — Telehealth (HOSPITAL_COMMUNITY): Payer: Self-pay

## 2015-02-28 LAB — CULTURE, BLOOD (ROUTINE X 2): Culture: NO GROWTH

## 2015-03-01 ENCOUNTER — Emergency Department (HOSPITAL_COMMUNITY): Payer: PPO

## 2015-03-01 ENCOUNTER — Observation Stay (HOSPITAL_COMMUNITY)
Admission: EM | Admit: 2015-03-01 | Discharge: 2015-03-03 | Disposition: A | Discharge status: Home / Self Care | Payer: PPO | Attending: Internal Medicine | Admitting: Internal Medicine

## 2015-03-01 ENCOUNTER — Encounter (HOSPITAL_COMMUNITY): Payer: Self-pay | Admitting: *Deleted

## 2015-03-01 DIAGNOSIS — R531 Weakness: Secondary | ICD-10-CM

## 2015-03-01 DIAGNOSIS — D649 Anemia, unspecified: Secondary | ICD-10-CM | POA: Diagnosis present

## 2015-03-01 DIAGNOSIS — J9610 Chronic respiratory failure, unspecified whether with hypoxia or hypercapnia: Secondary | ICD-10-CM | POA: Diagnosis present

## 2015-03-01 DIAGNOSIS — R42 Dizziness and giddiness: Secondary | ICD-10-CM | POA: Diagnosis not present

## 2015-03-01 DIAGNOSIS — A084 Viral intestinal infection, unspecified: Secondary | ICD-10-CM | POA: Insufficient documentation

## 2015-03-01 DIAGNOSIS — F419 Anxiety disorder, unspecified: Secondary | ICD-10-CM | POA: Insufficient documentation

## 2015-03-01 DIAGNOSIS — Z79899 Other long term (current) drug therapy: Secondary | ICD-10-CM | POA: Diagnosis not present

## 2015-03-01 DIAGNOSIS — R0602 Shortness of breath: Secondary | ICD-10-CM | POA: Diagnosis not present

## 2015-03-01 DIAGNOSIS — Z87891 Personal history of nicotine dependence: Secondary | ICD-10-CM | POA: Diagnosis not present

## 2015-03-01 DIAGNOSIS — Z7902 Long term (current) use of antithrombotics/antiplatelets: Secondary | ICD-10-CM | POA: Diagnosis not present

## 2015-03-01 DIAGNOSIS — R112 Nausea with vomiting, unspecified: Secondary | ICD-10-CM | POA: Diagnosis present

## 2015-03-01 DIAGNOSIS — E785 Hyperlipidemia, unspecified: Secondary | ICD-10-CM | POA: Insufficient documentation

## 2015-03-01 DIAGNOSIS — R011 Cardiac murmur, unspecified: Secondary | ICD-10-CM | POA: Diagnosis not present

## 2015-03-01 DIAGNOSIS — Z7982 Long term (current) use of aspirin: Secondary | ICD-10-CM | POA: Insufficient documentation

## 2015-03-01 DIAGNOSIS — F329 Major depressive disorder, single episode, unspecified: Secondary | ICD-10-CM | POA: Insufficient documentation

## 2015-03-01 DIAGNOSIS — R197 Diarrhea, unspecified: Secondary | ICD-10-CM | POA: Diagnosis present

## 2015-03-01 DIAGNOSIS — R55 Syncope and collapse: Secondary | ICD-10-CM

## 2015-03-01 DIAGNOSIS — Z955 Presence of coronary angioplasty implant and graft: Secondary | ICD-10-CM | POA: Insufficient documentation

## 2015-03-01 DIAGNOSIS — E860 Dehydration: Secondary | ICD-10-CM | POA: Insufficient documentation

## 2015-03-01 DIAGNOSIS — R110 Nausea: Secondary | ICD-10-CM

## 2015-03-01 DIAGNOSIS — J9611 Chronic respiratory failure with hypoxia: Secondary | ICD-10-CM | POA: Insufficient documentation

## 2015-03-01 DIAGNOSIS — G40909 Epilepsy, unspecified, not intractable, without status epilepticus: Secondary | ICD-10-CM | POA: Insufficient documentation

## 2015-03-01 DIAGNOSIS — M109 Gout, unspecified: Secondary | ICD-10-CM | POA: Insufficient documentation

## 2015-03-01 DIAGNOSIS — E039 Hypothyroidism, unspecified: Secondary | ICD-10-CM | POA: Diagnosis not present

## 2015-03-01 DIAGNOSIS — I951 Orthostatic hypotension: Secondary | ICD-10-CM | POA: Diagnosis not present

## 2015-03-01 DIAGNOSIS — R404 Transient alteration of awareness: Secondary | ICD-10-CM | POA: Diagnosis not present

## 2015-03-01 DIAGNOSIS — K219 Gastro-esophageal reflux disease without esophagitis: Secondary | ICD-10-CM | POA: Diagnosis not present

## 2015-03-01 DIAGNOSIS — J8410 Pulmonary fibrosis, unspecified: Secondary | ICD-10-CM | POA: Diagnosis not present

## 2015-03-01 DIAGNOSIS — I10 Essential (primary) hypertension: Secondary | ICD-10-CM | POA: Insufficient documentation

## 2015-03-01 DIAGNOSIS — E038 Other specified hypothyroidism: Secondary | ICD-10-CM

## 2015-03-01 DIAGNOSIS — I2510 Atherosclerotic heart disease of native coronary artery without angina pectoris: Secondary | ICD-10-CM | POA: Insufficient documentation

## 2015-03-01 DIAGNOSIS — I252 Old myocardial infarction: Secondary | ICD-10-CM | POA: Insufficient documentation

## 2015-03-01 DIAGNOSIS — Z8669 Personal history of other diseases of the nervous system and sense organs: Secondary | ICD-10-CM

## 2015-03-01 DIAGNOSIS — E876 Hypokalemia: Secondary | ICD-10-CM | POA: Diagnosis not present

## 2015-03-01 DIAGNOSIS — R1111 Vomiting without nausea: Secondary | ICD-10-CM | POA: Diagnosis not present

## 2015-03-01 LAB — CBC
HEMATOCRIT: 32.3 % — AB (ref 36.0–46.0)
HEMOGLOBIN: 10.6 g/dL — AB (ref 12.0–15.0)
MCH: 26.8 pg (ref 26.0–34.0)
MCHC: 32.8 g/dL (ref 30.0–36.0)
MCV: 81.6 fL (ref 78.0–100.0)
Platelets: 284 10*3/uL (ref 150–400)
RBC: 3.96 MIL/uL (ref 3.87–5.11)
RDW: 14 % (ref 11.5–15.5)
WBC: 9.7 10*3/uL (ref 4.0–10.5)

## 2015-03-01 LAB — DIFFERENTIAL
BASOS ABS: 0 10*3/uL (ref 0.0–0.1)
BASOS PCT: 0 %
EOS ABS: 0.1 10*3/uL (ref 0.0–0.7)
Eosinophils Relative: 1 %
Lymphocytes Relative: 16 %
Lymphs Abs: 1.6 10*3/uL (ref 0.7–4.0)
Monocytes Absolute: 1.2 10*3/uL — ABNORMAL HIGH (ref 0.1–1.0)
Monocytes Relative: 13 %
NEUTROS PCT: 70 %
Neutro Abs: 6.6 10*3/uL (ref 1.7–7.7)

## 2015-03-01 LAB — BASIC METABOLIC PANEL
ANION GAP: 12 (ref 5–15)
BUN: 16 mg/dL (ref 6–20)
CALCIUM: 7.8 mg/dL — AB (ref 8.9–10.3)
CO2: 22 mmol/L (ref 22–32)
Chloride: 104 mmol/L (ref 101–111)
Creatinine, Ser: 1.07 mg/dL — ABNORMAL HIGH (ref 0.44–1.00)
GFR, EST NON AFRICAN AMERICAN: 52 mL/min — AB (ref >60)
Glucose, Bld: 118 mg/dL — ABNORMAL HIGH (ref 65–99)
POTASSIUM: 3.3 mmol/L — AB (ref 3.5–5.1)
SODIUM: 138 mmol/L (ref 135–145)

## 2015-03-01 LAB — URINALYSIS, ROUTINE W REFLEX MICROSCOPIC
BILIRUBIN URINE: NEGATIVE
Glucose, UA: NEGATIVE mg/dL
Ketones, ur: NEGATIVE mg/dL
LEUKOCYTES UA: NEGATIVE
NITRITE: NEGATIVE
PH: 6 (ref 5.0–8.0)
Protein, ur: NEGATIVE mg/dL
SPECIFIC GRAVITY, URINE: 1.009 (ref 1.005–1.030)

## 2015-03-01 LAB — URINE MICROSCOPIC-ADD ON: Bacteria, UA: NONE SEEN

## 2015-03-01 LAB — BRAIN NATRIURETIC PEPTIDE: B Natriuretic Peptide: 128.8 pg/mL — ABNORMAL HIGH (ref 0.0–100.0)

## 2015-03-01 LAB — CBG MONITORING, ED: Glucose-Capillary: 107 mg/dL — ABNORMAL HIGH (ref 65–99)

## 2015-03-01 LAB — MAGNESIUM: MAGNESIUM: 1.1 mg/dL — AB (ref 1.7–2.4)

## 2015-03-01 LAB — I-STAT TROPONIN, ED: TROPONIN I, POC: 0 ng/mL (ref 0.00–0.08)

## 2015-03-01 MED ORDER — PANTOPRAZOLE SODIUM 40 MG PO TBEC
40.0000 mg | DELAYED_RELEASE_TABLET | Freq: Every day | ORAL | Status: DC
  Administered 2015-03-01 – 2015-03-03 (×3): 40 mg via ORAL
  Filled 2015-03-01 (×3): qty 1

## 2015-03-01 MED ORDER — ONDANSETRON HCL 4 MG PO TABS: 4.0000 mg | ORAL_TABLET | Freq: Four times a day (QID) | ORAL | Status: DC | PRN

## 2015-03-01 MED ORDER — CLOPIDOGREL BISULFATE 75 MG PO TABS
75.0000 mg | ORAL_TABLET | Freq: Every day | ORAL | Status: DC
  Administered 2015-03-02 – 2015-03-03 (×2): 75 mg via ORAL
  Filled 2015-03-01 (×2): qty 1

## 2015-03-01 MED ORDER — ACETAMINOPHEN 325 MG PO TABS: 650.0000 mg | ORAL_TABLET | Freq: Four times a day (QID) | ORAL | Status: DC | PRN

## 2015-03-01 MED ORDER — ALPRAZOLAM 0.25 MG PO TABS
0.2500 mg | ORAL_TABLET | Freq: Every day | ORAL | Status: DC
  Administered 2015-03-01 – 2015-03-02 (×2): 0.25 mg via ORAL
  Filled 2015-03-01 (×2): qty 1

## 2015-03-01 MED ORDER — LEVETIRACETAM 500 MG PO TABS
500.0000 mg | ORAL_TABLET | Freq: Two times a day (BID) | ORAL | Status: DC
  Administered 2015-03-01 – 2015-03-03 (×4): 500 mg via ORAL
  Filled 2015-03-01 (×4): qty 1

## 2015-03-01 MED ORDER — ONDANSETRON HCL 4 MG/2ML IJ SOLN
4.0000 mg | Freq: Once | INTRAMUSCULAR | Status: AC
  Administered 2015-03-01: 4 mg via INTRAVENOUS
  Filled 2015-03-01: qty 2

## 2015-03-01 MED ORDER — METOCLOPRAMIDE HCL 5 MG/ML IJ SOLN
10.0000 mg | Freq: Once | INTRAMUSCULAR | Status: AC
  Administered 2015-03-01: 10 mg via INTRAVENOUS
  Filled 2015-03-01: qty 2

## 2015-03-01 MED ORDER — SODIUM CHLORIDE 0.9 % IV BOLUS (SEPSIS)
1000.0000 mL | Freq: Once | INTRAVENOUS | Status: AC
  Administered 2015-03-01: 1000 mL via INTRAVENOUS

## 2015-03-01 MED ORDER — ENOXAPARIN SODIUM 40 MG/0.4ML ~~LOC~~ SOLN
40.0000 mg | Freq: Every day | SUBCUTANEOUS | Status: DC
  Administered 2015-03-01 – 2015-03-02 (×2): 40 mg via SUBCUTANEOUS
  Filled 2015-03-01 (×2): qty 0.4

## 2015-03-01 MED ORDER — HYDROCODONE-ACETAMINOPHEN 5-325 MG PO TABS: 1.0000 | ORAL_TABLET | ORAL | Status: DC | PRN

## 2015-03-01 MED ORDER — POTASSIUM CHLORIDE IN NACL 40-0.9 MEQ/L-% IV SOLN
INTRAVENOUS | Status: AC
  Administered 2015-03-01 – 2015-03-02 (×2): 125 mL/h via INTRAVENOUS
  Filled 2015-03-01 (×2): qty 1000

## 2015-03-01 MED ORDER — NITROGLYCERIN 0.4 MG SL SUBL: 0.4000 mg | SUBLINGUAL_TABLET | SUBLINGUAL | Status: DC | PRN

## 2015-03-01 MED ORDER — METOPROLOL TARTRATE 25 MG PO TABS
12.5000 mg | ORAL_TABLET | Freq: Two times a day (BID) | ORAL | Status: DC
  Administered 2015-03-02 (×2): 12.5 mg via ORAL
  Filled 2015-03-01 (×2): qty 1

## 2015-03-01 MED ORDER — POTASSIUM CHLORIDE CRYS ER 20 MEQ PO TBCR
20.0000 meq | EXTENDED_RELEASE_TABLET | Freq: Once | ORAL | Status: AC
  Administered 2015-03-01: 20 meq via ORAL
  Filled 2015-03-01: qty 1

## 2015-03-01 MED ORDER — ACETAMINOPHEN 650 MG RE SUPP: 650.0000 mg | Freq: Four times a day (QID) | RECTAL | Status: DC | PRN

## 2015-03-01 MED ORDER — ONDANSETRON HCL 4 MG/2ML IJ SOLN: 4.0000 mg | Freq: Once | INTRAMUSCULAR | Status: DC

## 2015-03-01 MED ORDER — LEVOTHYROXINE SODIUM 100 MCG PO TABS
100.0000 ug | ORAL_TABLET | Freq: Every day | ORAL | Status: DC
  Administered 2015-03-02 – 2015-03-03 (×2): 100 ug via ORAL
  Filled 2015-03-01 (×2): qty 1

## 2015-03-01 MED ORDER — MAGNESIUM SULFATE 2 GM/50ML IV SOLN
2.0000 g | Freq: Once | INTRAVENOUS | Status: AC
  Administered 2015-03-01: 2 g via INTRAVENOUS
  Filled 2015-03-01: qty 50

## 2015-03-01 MED ORDER — PAROXETINE HCL 30 MG PO TABS
30.0000 mg | ORAL_TABLET | Freq: Every day | ORAL | Status: DC
  Administered 2015-03-02 – 2015-03-03 (×2): 30 mg via ORAL
  Filled 2015-03-01 (×2): qty 1

## 2015-03-01 MED ORDER — ROSUVASTATIN CALCIUM 20 MG PO TABS
20.0000 mg | ORAL_TABLET | Freq: Every day | ORAL | Status: DC
  Administered 2015-03-02: 20 mg via ORAL
  Filled 2015-03-01: qty 1

## 2015-03-01 MED ORDER — CALCITRIOL 0.5 MCG PO CAPS
0.5000 ug | ORAL_CAPSULE | Freq: Every day | ORAL | Status: DC
  Administered 2015-03-02 – 2015-03-03 (×2): 0.5 ug via ORAL
  Filled 2015-03-01 (×2): qty 1

## 2015-03-01 MED ORDER — SODIUM CHLORIDE 0.9% FLUSH
3.0000 mL | Freq: Two times a day (BID) | INTRAVENOUS | Status: DC
  Administered 2015-03-01 – 2015-03-02 (×2): 3 mL via INTRAVENOUS

## 2015-03-01 MED ORDER — MORPHINE SULFATE (PF) 2 MG/ML IV SOLN: 2.0000 mg | INTRAVENOUS | Status: DC | PRN

## 2015-03-01 MED ORDER — ONDANSETRON HCL 4 MG/2ML IJ SOLN
4.0000 mg | Freq: Four times a day (QID) | INTRAMUSCULAR | Status: DC | PRN
  Administered 2015-03-02 – 2015-03-03 (×3): 4 mg via INTRAVENOUS
  Filled 2015-03-01 (×3): qty 2

## 2015-03-01 MED ORDER — POLYETHYLENE GLYCOL 3350 17 G PO PACK: 17.0000 g | PACK | Freq: Every day | ORAL | Status: DC | PRN

## 2015-03-01 MED ORDER — ISOSORBIDE MONONITRATE ER 60 MG PO TB24
90.0000 mg | ORAL_TABLET | Freq: Every day | ORAL | Status: DC
  Administered 2015-03-02 – 2015-03-03 (×2): 90 mg via ORAL
  Filled 2015-03-01 (×2): qty 1

## 2015-03-01 MED ORDER — ASPIRIN EC 81 MG PO TBEC
81.0000 mg | DELAYED_RELEASE_TABLET | Freq: Every day | ORAL | Status: DC
  Administered 2015-03-02 – 2015-03-03 (×2): 81 mg via ORAL
  Filled 2015-03-01 (×2): qty 1

## 2015-03-01 MED ORDER — BISACODYL 10 MG RE SUPP: 10.0000 mg | Freq: Every day | RECTAL | Status: DC | PRN

## 2015-03-01 NOTE — ED Notes (Addendum)
Per EMS, pt had syncopal episode and fall today at ~1230PM today. Pt states she had similar episode earlier this week. Pt states she has felt sick for a month, pt had a lung biopsy 1 month ago. Pt denies pain, pt is on blood thinners. Pt states she did not hit her head, pt was laying on couch upon EMS arrival. Pt given 4mg  zofran for nausea via EMS, pt states she has had vomiting and diarrhea for 2 days. CBG 136, O2 sat 92% on RA.

## 2015-03-01 NOTE — ED Notes (Signed)
Bed: [id number] Expected date: 03/01/15 Expected time: 1:16 PM Means of arrival: Ambulance Comments: fall

## 2015-03-01 NOTE — H&P (Signed)
[facility] History and Physical  [name] [id number] DOB: 1945/04/05 DOA: 03/01/2015  Referring physician: ED physician PCP: Reginia Naas, MD  Specialists: Dr. Darcey Nora (CTS), Dr. Chase Caller (pulm), Dr. Radford Pax (cards), Dr. Henrene Pastor (GI)  Chief Complaint:  Syncope   HPI: [name] is a 70 y.o. female with PMH of coronary artery disease with 3 DES placed in 2011, chronic respiratory failure with supplemental oxygen requirement at night, depression, anxiety, and hypothyroidism who presents to the hospital following a syncopal event at home. Patient was just admitted to this institution from 02/23/2015 - 02/26/2015 with generalized weakness and underwent right VATS with open lung biopsy, pathology without clear diagnosis, but no findings suggestive of interstitial fibrosis, but rather suggestive of chronic bronchitis with some granulomatous features. She was also treated for hypokalemia and acute diarrhea, both of which resolved by time of discharge. She reports that she had initially done quite well back at home until approximately 2 days ago when she experienced a subjective fever and nausea with vomiting and diarrhea. She denies abdominal pain, flank pain, hematemesis, melena, or hematochezia. There is no dysuria, hematuria, or increased urinary urgency or frequency. There is been no long distance travel or sick contacts. Over this interval, she has only been able to tolerate very little PO intake. Her symptoms progressed, were accompanied by a recurrence and generalized weakness, and culminated with a syncopal episode just prior to arrival. Patient was seated in her living room, and upon rising to walk to the kitchen, experienced a sensation of her visual fields closing in, lightheadedness, and then syncope. Her son was in the next room and was present immediately after her fall, noting that the episode lasted several seconds before she regained consciousness. There was no  convulsions, incontinence, or tongue bite. Patient denies hitting her head and denies any pain upon arrival. There was no postictal period. There was no preceding palpitations or chest pains.   In ED, patient was found to  be afebrile, saturating adequately on 2 L/m supplemental oxygen, and blood pressure in the 95/60 range. EKG was obtained and features of sinus rhythm with poor R-wave progression. Chest x-ray is notable for mild bronchitic changes. Orthostatic vital signs were obtained and positive. Basic blood work returned with an undetectable troponin, stable normocytic anemia, mild hypokalemia, and hypomagnesemia. Urinalysis is unremarkable. Patient was bolused with 2 L of normal saline in the emergency department, supplemented with 2 g of IV magnesium and 20 mEq of potassium, and orthostatic vital signs were repeated. Despite the fluid bolus, the patient continued to demonstrate orthostasis and the hospitalists were called on for admission. Patient will be admitted to the telemetry unit for ongoing evaluation and management of syncope suspected secondary to orthostatic hypotension.   Where does patient live?   At home    Can patient participate in ADLs?  Yes       Review of Systems:   General: no chills, sweats, or weight change. Subjective fever 2 days ago, fatigue, loss of appetite HEENT: no blurry vision, hearing changes or sore throat Pulm: no worsening in chronic dyspnea, cough, or wheeze CV: no chest pain or palpitations Abd: no abdominal pain or constipation. Nausea, vomiting, diarrhea GU: no dysuria, hematuria, increased urinary frequency, or urgency  Ext: no leg edema Neuro: no focal weakness, numbness, or tingling, no vision change or hearing loss Skin: no rash, no wounds MSK: No muscle spasm, no deformity, no red, hot, or swollen joint Heme: No easy bruising or bleeding Travel  history: No recent long distant travel    Allergy:  Allergies  Allergen Reactions  . Tape Rash     ? Clear tape    Past Medical History  Diagnosis Date  . Irritable bowel syndrome   . Hypertension   . Depression   . Hypothyroidism   . Internal hemorrhoids   . Anxiety   . Heart murmur   . Hyperlipidemia   . Vitamin D deficiency   . Chronic low back pain   . Carpal tunnel syndrome   . History of non-ST elevation myocardial infarction (NSTEMI)     2011--  S/P PCI WITH SENTING  . S/P drug eluting coronary stent placement     X3  in 2011, 2012, 2014  . GERD (gastroesophageal reflux disease)   . History of hiatal hernia   . Partial seizure disorder (HCC) NEUROLOGIST-- DR WILLIS    NOCTURNAL  . History of supraventricular tachycardia     2011-- resolved  . History of esophageal dilatation     FOR STRIUCTURE  . Gross hematuria   . Arthritis   . Cervical spondylosis without myelopathy   . S/P pericardial cyst excision     02-05-2013  benign  . Coronary artery disease CARDIOLOGIST-  DR Irish Lack    hx NSTEMI inferoposterior 2011 s/p PCI  total x3 DES's  . History of gout   . PONV (postoperative nausea and vomiting)     hard time getting iv site-had to do neck stick 2 yrs ago  . Anginal pain ([place])     over yr last time  . Shortness of breath dyspnea   . History of kidney stones   . Seizures ([place])     none in yrs  . Pulmonary fibrosis [facility])     Past Surgical History  Procedure Laterality Date  . Mediasternotomy N/A 02/05/2013    Procedure: MEDIAN STERNOTOMY;  Surgeon: Ivin Poot, MD;  Location: [facility] OR;  Service: Thoracic;  Laterality: N/A;  . Biopsy of mediastinal mass N/A 02/05/2013    Procedure: RESECTION OF MEDIASTINAL MASS;  Surgeon: Ivin Poot, MD;  Location: [facility] OR;  Service: Thoracic;  Laterality: N/A;  . Ectopic pregnancy surgery  YRS AGO    SALPINGECTOMY  . Coronary angiogram  03/10/2011    Procedure: CORONARY ANGIOGRAM;  Surgeon: Jettie Booze, MD;  Location: [facility] CATH LAB;  Service: Cardiovascular;;  . Left heart catheterization with coronary  angiogram N/A 03/14/2012    Procedure: LEFT HEART CATHETERIZATION WITH CORONARY ANGIOGRAM;  Surgeon: Sueanne Margarita, MD;  Location: [place] CATH LAB;  Service: Cardiovascular;  Laterality: N/A;  Normal LM,  50% pLAD,  70% mLAD,  D2 50-70%, very tortuous LAD,  70-82% ostial LCFX,  50% in-stent restenosis of  dRCA and mRCA  stent and 50-70% ostial PDA,  normal LVSF, ef 60%  . Percutaneous coronary stent intervention (pci-s) N/A 04/03/2012    Procedure: PERCUTANEOUS CORONARY STENT INTERVENTION (PCI-S);  Surgeon: Jettie Booze, MD;  Location: [facility] CATH LAB;  Service: Cardiovascular;  Laterality: N/A;   Successful PCI  mLAD with 2.75x12 Promus stent, postdilated to >0.23mm  . Coronary angioplasty with stent placement  11-22-2009  dr Irish Lack    Acute inferoposterior MI/  ef 60%,  PCI with DES x1 to dRCA,  25%  mLAD  . Coronary angioplasty with stent placement  06-26-2010  dr Daneen Schick    Cutting balloon angioplasty to dRCA with DES x1,  40% mLAD (non-obstructive CAD)  . Transthoracic echocardiogram  11-17-2011    grade I diastolic dysfunction/  ef 55-60%  . Cardiovascular stress test  11-22-2013  dr Irish Lack    normal lexiscan study/  no ischemia/  normal LVF and wall motion/ ef 81%  . Esophagogastroduodenoscopy (egd) with esophageal dilation  05-20-2011  . Colonoscopy  last one 2014  . Needle guided excision breast calcifications Right 08-09-2008  . Cystoscopy with retrograde pyelogram, ureteroscopy and stent placement Right 02/13/2014    Procedure: CYSTOSCOPY WITH RETROGRADE PYELOGRAM, URETEROSCOPY AND STENT PLACEMENT;  Surgeon: Bernestine Amass, MD;  Location: [facility];  Service: Urology;  Laterality: Right;  . Holmium laser application Right [id number]    Procedure: HOLMIUM LASER APPLICATION;  Surgeon: Bernestine Amass, MD;  Location: [facility];  Service: Urology;  Laterality: Right;  . Esophagogastroduodenoscopy N/A 02/15/2014    Procedure: ESOPHAGOGASTRODUODENOSCOPY (EGD);   Surgeon: Jerene Bears, MD;  Location: [facility];  Service: Endoscopy;  Laterality: N/A;  . Lung biopsy Right 01/24/2015    Procedure: RIGHT LUNG BIOPSY;  Surgeon: Ivin Poot, MD;  Location: [place];  Service: Thoracic;  Laterality: Right;  . Video bronchoscopy N/A 01/24/2015    Procedure: RIGHT VIDEO BRONCHOSCOPY;  Surgeon: Ivin Poot, MD;  Location: [facility] OR;  Service: Thoracic;  Laterality: N/A;  . Cardiac catheterization Right 01/24/2015    Procedure: right femoral ARTERIAL LINE INSERTION;  Surgeon: Ivin Poot, MD;  Location: [place];  Service: Thoracic;  Laterality: Right;  . Thoracotomy Right 01/24/2015    Procedure: RIGHT MINI/LIMITED THORACOTOMY;  Surgeon: Ivin Poot, MD;  Location: [facility] OR;  Service: Thoracic;  Laterality: Right;    Social History:  reports that she quit smoking about 47 years ago. Her smoking use included Cigarettes. She started smoking about 50 years ago. She has a .9 pack-year smoking history. She has never used smokeless tobacco. She reports that she does not drink alcohol or use illicit drugs.  Family History:  Family History  Problem Relation Age of Onset  . Breast cancer Sister   . Cancer Sister     breast  . Breast cancer      niece  . Heart disease Father   . Hypertension Father   . Emphysema Father   . Coronary artery disease Father   . Colon cancer Neg Hx   . Heart attack Mother   . CVA Mother      Prior to Admission medications   Medication Sig Start Date End Date Taking? Authorizing Provider  ALPRAZolam (XANAX) 0.25 MG tablet Take 0.25 mg by mouth at bedtime.   Yes Historical Provider, MD  aspirin EC 81 MG tablet Take 81 mg by mouth daily.   Yes Historical Provider, MD  azithromycin (ZITHROMAX) 250 MG tablet Take 250-500 mg by mouth daily. Take 500mg  on day 1, and then 250 daily for 4 days 02/23/15  Yes Historical Provider, MD  calcitRIOL (ROCALTROL) 0.5 MCG capsule Take 0.5 mcg by mouth daily.   Yes Historical Provider, MD   clopidogrel (PLAVIX) 75 MG tablet Take 1 tablet (75 mg total) by mouth daily with breakfast. 07/18/14  Yes Sueanne Margarita, MD  diltiazem (MATZIM LA) 240 MG 24 hr tablet Take 240 mg by mouth every morning.    Yes Historical Provider, MD  doxazosin (CARDURA) 2 MG tablet TAKE 1 TABLET (2 MG TOTAL) BY MOUTH DAILY. 01/29/15  Yes Sueanne Margarita, MD  furosemide (LASIX) 20 MG tablet Take 1 tablet (20 mg total) by mouth  every other day. 12/02/14  Yes Sueanne Margarita, MD  isosorbide mononitrate (IMDUR) 60 MG 24 hr tablet TAKE 1 AND 1/2 TABLETS BY MOUTH EVERY DAY Patient taking differently: TAKE 1 AND 1/2 TABLETS= 90 mg BY MOUTH EVERY DAY 12/24/14  Yes Sueanne Margarita, MD  levETIRAcetam (KEPPRA) 500 MG tablet Take 1 tablet (500 mg total) by mouth 2 (two) times daily. 07/29/14  Yes Ward Givens, NP  levothyroxine (SYNTHROID, LEVOTHROID) 100 MCG tablet Take 100 mcg by mouth daily before breakfast.   Yes Historical Provider, MD  loperamide (IMODIUM) 1 MG/5ML solution Take 1 mg by mouth as needed for diarrhea or loose stools.   Yes Historical Provider, MD  losartan (COZAAR) 100 MG tablet Take 100 mg by mouth every morning.    Yes Historical Provider, MD  metoprolol succinate (TOPROL-XL) 50 MG 24 hr tablet Take 50 mg by mouth daily. Take with or immediately following a meal.   Yes Historical Provider, MD  nitroGLYCERIN (NITROSTAT) 0.4 MG SL tablet Place 1 tablet (0.4 mg total) under the tongue every 5 (five) minutes as needed for chest pain. 06/27/14  Yes Sueanne Margarita, MD  ondansetron (ZOFRAN) 8 MG tablet Take 4-8 mg by mouth every 6 (six) hours as needed for nausea or vomiting.   Yes Historical Provider, MD  pantoprazole (PROTONIX) 40 MG tablet TAKE 1 TABLET (40 MG TOTAL) BY MOUTH 2 (TWO) TIMES DAILY. 10/30/14  Yes Irene Shipper, MD  PARoxetine (PAXIL) 30 MG tablet Take 30 mg by mouth daily. 11/17/13  Yes Historical Provider, MD  Phenylephrine-DM-GG (MUCINEX FAST-MAX CONGEST COUGH) 2.5-5-100 MG/5ML LIQD Take 20 mLs by  mouth every 4 (four) hours as needed (for cough and congestion).   Yes Historical Provider, MD  potassium chloride SA (K-DUR,KLOR-CON) 20 MEQ tablet Take 1 tablet (20 mEq total) by mouth every other day. Take with Lasix. 02/26/15  Yes [name], MD  rosuvastatin (CRESTOR) 20 MG tablet Take 1 tablet (20 mg total) by mouth every morning. 11/20/14  Yes Sueanne Margarita, MD  albuterol (PROVENTIL HFA;VENTOLIN HFA) 108 (90 Base) MCG/ACT inhaler Inhale 2 puffs into the lungs every 6 (six) hours as needed for wheezing or shortness of breath. Patient not taking: Reported on 03/01/2015 02/26/15   [name], MD    Physical Exam: Filed Vitals:   03/01/15 1338 03/01/15 1530 03/01/15 1636 03/01/15 1856  BP: 99/66 94/62 103/57 103/66  Pulse: 63 60 61 64  Temp: 97.4 F (36.3 C)     TempSrc: Oral     Resp: 20 19 15 20   SpO2: 90% 97% 96% 93%   General: Not in acute distress HEENT:       Eyes: PERRL, EOMI, no scleral icterus or conjunctival pallor.       ENT: No discharge from the ears or nose, no pharyngeal ulcers, petechiae or exudate, oral mucosa moist and pink.        Neck: No JVD, no bruit, no appreciable mass Heme: No cervical adenopathy, no pallor Cardiac: S1/S2, RRR, soft early systolic murmur at LSB, No gallops or rubs. Pulm: Good air movement bilaterally. No rales, wheezing, rhonchi or rubs. Nasal canula in place.  Abd: Soft, nondistended, nontender, no rebound pain or gaurding, no mass or organomegaly, BS present. Ext: No LE edema bilaterally. 2+DP/PT pulse bilaterally. Musculoskeletal: No gross deformity, no red, hot, swollen joints   Skin: No rashes or wounds on exposed surfaces  Neuro: Alert, oriented X3, cranial nerves II-XII grossly intact. No focal  findings Psych: Patient is not overtly psychotic, appropriate mood and affect.  Labs on Admission:  Basic Metabolic Panel:  Recent Labs Lab 02/23/15 1747 02/24/15 0346 02/25/15 0524 02/26/15 0501 03/01/15 1348 03/01/15 1400  NA  134* 142 142 140 138  --   K 2.9* 3.9 3.3* 4.6 3.3*  --   CL 105 113* 112* 112* 104  --   CO2 18* 19* 19* 22 22  --   GLUCOSE 121* 106* 148* 126* 118*  --   BUN 23* 19 11 12 16   --   CREATININE 1.89* 1.52* 1.14* 1.11* 1.07*  --   CALCIUM 7.1* 7.5* 8.0* 8.2* 7.8*  --   MG 0.7*  --  2.0  --   --  1.1*   Liver Function Tests:  Recent Labs Lab 02/23/15 1747  AST 28  ALT 12*  ALKPHOS 79  BILITOT 0.6  PROT 6.5  ALBUMIN 3.2*   No results for input(s): LIPASE, AMYLASE in the last 168 hours. No results for input(s): AMMONIA in the last 168 hours. CBC:  Recent Labs Lab 02/23/15 1747 02/24/15 0346 02/25/15 0524 02/26/15 0501 03/01/15 1348  WBC 5.1 4.0 4.2 8.6 9.7  NEUTROABS 3.3  --   --   --  6.6  HGB 10.0* 8.7* 9.3* 9.2* 10.6*  HCT 30.0* 27.6* 29.2* 28.7* 32.3*  MCV 80.9 85.4 84.4 85.4 81.6  PLT 174 156 156 188 284   Cardiac Enzymes: No results for input(s): CKTOTAL, CKMB, CKMBINDEX, TROPONINI in the last 168 hours.  BNP (last 3 results)  Recent Labs  10/15/14 0311 02/23/15 1747  BNP 193.2* 95.6    ProBNP (last 3 results) No results for input(s): PROBNP in the last 8760 hours.  CBG:  Recent Labs Lab 03/01/15 1359  GLUCAP 107*    Radiological Exams on Admission: Dg Chest 2 View  03/01/2015  CLINICAL DATA:  Shortness of breath and chest congestion today. EXAM: CHEST  2 VIEW COMPARISON:  02/23/2015. FINDINGS: Normal sized heart. Mildly displaced right lateral seventh rib fracture without pneumothorax. Minimal right pleural fluid. Mitral valve annulus calcifications. Post CABG changes. Right diaphragmatic eventration. Thoracic spine degenerative changes. Mild diffuse peribronchial thickening. IMPRESSION: 1. Mild bronchitic changes. 2. Mildly displaced right lateral seventh rib fracture with a minimal right pleural effusion. Electronically Signed   By: Claudie Revering M.D.   On: 03/01/2015 15:34   Ct Head Wo Contrast  03/01/2015  CLINICAL DATA:  Syncope and fall  today 6 hours prior. Patient on Plavix. EXAM: CT HEAD WITHOUT CONTRAST TECHNIQUE: Contiguous axial images were obtained from the base of the skull through the vertex without intravenous contrast. COMPARISON:  Brain MRI 12/05/2013, head CT 11/13/2011 FINDINGS: No intracranial hemorrhage, mass effect, or midline shift. 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 No hydrocephalus. The basilar cisterns are patent. No evidence of territorial infarct. No intracranial fluid collection. Calvarium is intact. Included paranasal sinuses are clear, minimal opacification of lower right mastoid air cells unchanged. IMPRESSION: No acute intracranial abnormality. Electronically Signed   By: Jeb Levering M.D.   On: 03/01/2015 19:03    EKG: Independently reviewed.  Abnormal findings:   Sinus rhythm, poor R-wave progression   Assessment/Plan  1. Syncope secondary to orthostatic hypotension  - Occurs in the setting of acute gastroenteritis with diarrhea, vomiting, and poor PO intake; she continues numerous antihypertensives and Lasix  - Orthostatic vitals positive in ED  with with ~30 mm Hg drop in SBP, and 30 bpm increase in HR; orthostasis persists after 2 L NS bolus in ED  - No preceding CP or palpitations to suggest this is cardiogenic; TTE completed just 4 mos ago with no significant structural heart disease  - Plan to monitor on telemetry overnight for arrhythmia; will hold off on repeating TTE at this time as no evidence of structural heart disease on recent echo, largely unremarkable EKG, and no arrhythmia on tele - Hold antihypertensives and Lasix, continue IVF hydration with NS + K  - Repeat orthostatic vitals qShift  - Consider resuming a low-dose metoprolol as tolerated in the am to avoid precipitating beta-blocker withdrawal and catecholamine surge  - Fall precautions   2.  Dehydration with hypokalemia, hypomagnesemia  - Secondary to GI losses in setting of acute GE with vomiting and diarrhea  - Fluid resuscitating, replete lytes  - Repeat chem panel in am, continue replacement prn - Holding Lasix and antihypertensives at this time    3. Acute viral gastroenteritis   - Pt is non-toxic, afebrile, without leukocytosis, and abd exam is benign, and diarrhea non-bloody   - This is day 3 of illness; expect improvement within 2-3 more days - If fails to improve/resolve as expected, may need to extend w/u  - Replacing fluids and lytes as above  - Was on abx during recent hospitalization, will screen for C diff, treat if positive  4. Chronic respiratory failure  - Requires 2 Lpm supplemental O2 at night  - Followed by Dr. Chase Caller and previously thought secondary to ILD  - She underwent Rt VATS with open lung biopsy 02/24/15 with Dr. Prescott Gum; path not consistent with ILD, but more suggestive of bronchitis with a granulomatous component  - Dr. Chase Caller has referred her to [facility] for second opinion; appt date not yet determined  - CXR without acute findings, pt in no apparent respiratory distress  - Continue supplemental O2 prn sats <92%   5. Seizure disorder  - Continue home-dose Keppra   6. Hypothyroidism  - Continue home-dose Synthroid   7. Normocytic anemia  - Hgb 10.6 on admission, consistent with her baseline  - Likely secondary to chronic disease  - No sign of active blood loss - Monitor    DVT ppx: SQ Lovenox      Code Status: Full code Family Communication: None at bed side.         Disposition Plan: Admit to inpatient   Date of Service 03/01/2015    Vianne Bulls, MD [facility] Pager [phone]  If 7PM-7AM, please contact night-coverage [email or web address] Password [facility] 03/01/2015, 9:08 PM

## 2015-03-01 NOTE — ED Notes (Signed)
Pt transported to CT ?

## 2015-03-01 NOTE — ED Provider Notes (Addendum)
CSN: [id number]     Arrival date & time 03/01/15  1317 History   First MD Initiated Contact with Patient 03/01/15 1456     Chief Complaint  Patient presents with  . Loss of Consciousness     (Consider location/radiation/quality/duration/timing/severity/associated sxs/prior Treatment) HPI  70 year old female who presents after syncopal episode. History of hypertension, IBS, CAD, seizure disorder, and interstitial lung disease. Was recently hospitalized in early February and discharged 3 days ago for supportive management due to likely viral illness. At that time she was having upper respiratory and GI symptoms and was getting treated for dehydration and electrolyte derangements. States that she felt slightly improved upon discharge, but since she has been home has not had appetite. Has not been eating or drinking fluids, and states that she has been increasingly feeling lightheaded. Today also started vomiting, and had recurrent diarrhea starting yesterday. Persistent cough and shortness of breath with fatigue with minimal activity. States that whenever she sits up or stands up she feels very lightheaded. On on her way from the living room to the kitchen today states that she felt very lightheaded and passed out. Her son who is in the next room over came and found her passed out on the ground. States that episode was brief, there was no seizure activity, and she had a quick return to baseline when she awoke. No fever, chest pain, abdominal pain, or dysuria.  Past Medical History  Diagnosis Date  . Irritable bowel syndrome   . Hypertension   . Depression   . Hypothyroidism   . Internal hemorrhoids   . Anxiety   . Heart murmur   . Hyperlipidemia   . Vitamin D deficiency   . Chronic low back pain   . Carpal tunnel syndrome   . History of non-ST elevation myocardial infarction (NSTEMI)     2011--  S/P PCI WITH SENTING  . S/P drug eluting coronary stent placement     X3  in 2011, 2012, 2014   . GERD (gastroesophageal reflux disease)   . History of hiatal hernia   . Partial seizure disorder (HCC) NEUROLOGIST-- DR WILLIS    NOCTURNAL  . History of supraventricular tachycardia     2011-- resolved  . History of esophageal dilatation     FOR STRIUCTURE  . Gross hematuria   . Arthritis   . Cervical spondylosis without myelopathy   . S/P pericardial cyst excision     02-05-2013  benign  . Coronary artery disease CARDIOLOGIST-  DR Irish Lack    hx NSTEMI inferoposterior 2011 s/p PCI  total x3 DES's  . History of gout   . PONV (postoperative nausea and vomiting)     hard time getting iv site-had to do neck stick 2 yrs ago  . Anginal pain ([place])     over yr last time  . Shortness of breath dyspnea   . History of kidney stones   . Seizures ([place])     none in yrs  . Pulmonary fibrosis [facility])    Past Surgical History  Procedure Laterality Date  . Mediasternotomy N/A 02/05/2013    Procedure: MEDIAN STERNOTOMY;  Surgeon: Ivin Poot, MD;  Location: [facility] OR;  Service: Thoracic;  Laterality: N/A;  . Biopsy of mediastinal mass N/A 02/05/2013    Procedure: RESECTION OF MEDIASTINAL MASS;  Surgeon: Ivin Poot, MD;  Location: [facility] OR;  Service: Thoracic;  Laterality: N/A;  . Ectopic pregnancy surgery  YRS AGO    SALPINGECTOMY  . Coronary  angiogram  03/10/2011    Procedure: CORONARY ANGIOGRAM;  Surgeon: Jettie Booze, MD;  Location: [facility] CATH LAB;  Service: Cardiovascular;;  . Left heart catheterization with coronary angiogram N/A 03/14/2012    Procedure: LEFT HEART CATHETERIZATION WITH CORONARY ANGIOGRAM;  Surgeon: Sueanne Margarita, MD;  Location: [place] CATH LAB;  Service: Cardiovascular;  Laterality: N/A;  Normal LM,  50% pLAD,  70% mLAD,  D2 50-70%, very tortuous LAD,  70-82% ostial LCFX,  50% in-stent restenosis of  dRCA and mRCA  stent and 50-70% ostial PDA,  normal LVSF, ef 60%  . Percutaneous coronary stent intervention (pci-s) N/A 04/03/2012    Procedure: PERCUTANEOUS CORONARY STENT  INTERVENTION (PCI-S);  Surgeon: Jettie Booze, MD;  Location: [facility] CATH LAB;  Service: Cardiovascular;  Laterality: N/A;   Successful PCI  mLAD with 2.75x12 Promus stent, postdilated to >0.89mm  . Coronary angioplasty with stent placement  11-22-2009  dr Irish Lack    Acute inferoposterior MI/  ef 60%,  PCI with DES x1 to dRCA,  25%  mLAD  . Coronary angioplasty with stent placement  06-26-2010  dr Daneen Schick    Cutting balloon angioplasty to dRCA with DES x1,  40% mLAD (non-obstructive CAD)  . Transthoracic echocardiogram  11-17-2011    grade I diastolic dysfunction/  ef 55-60%  . Cardiovascular stress test  11-22-2013  dr Irish Lack    normal lexiscan study/  no ischemia/  normal LVF and wall motion/ ef 81%  . Esophagogastroduodenoscopy (egd) with esophageal dilation  05-20-2011  . Colonoscopy  last one 2014  . Needle guided excision breast calcifications Right 08-09-2008  . Cystoscopy with retrograde pyelogram, ureteroscopy and stent placement Right 02/13/2014    Procedure: CYSTOSCOPY WITH RETROGRADE PYELOGRAM, URETEROSCOPY AND STENT PLACEMENT;  Surgeon: Bernestine Amass, MD;  Location: [facility];  Service: Urology;  Laterality: Right;  . Holmium laser application Right [id number]    Procedure: HOLMIUM LASER APPLICATION;  Surgeon: Bernestine Amass, MD;  Location: [facility];  Service: Urology;  Laterality: Right;  . Esophagogastroduodenoscopy N/A 02/15/2014    Procedure: ESOPHAGOGASTRODUODENOSCOPY (EGD);  Surgeon: Jerene Bears, MD;  Location: [facility];  Service: Endoscopy;  Laterality: N/A;  . Lung biopsy Right 01/24/2015    Procedure: RIGHT LUNG BIOPSY;  Surgeon: Ivin Poot, MD;  Location: [place];  Service: Thoracic;  Laterality: Right;  . Video bronchoscopy N/A 01/24/2015    Procedure: RIGHT VIDEO BRONCHOSCOPY;  Surgeon: Ivin Poot, MD;  Location: [facility] OR;  Service: Thoracic;  Laterality: N/A;  . Cardiac catheterization Right 01/24/2015    Procedure: right  femoral ARTERIAL LINE INSERTION;  Surgeon: Ivin Poot, MD;  Location: [place];  Service: Thoracic;  Laterality: Right;  . Thoracotomy Right 01/24/2015    Procedure: RIGHT MINI/LIMITED THORACOTOMY;  Surgeon: Ivin Poot, MD;  Location: [facility] OR;  Service: Thoracic;  Laterality: Right;   Family History  Problem Relation Age of Onset  . Breast cancer Sister   . Cancer Sister     breast  . Breast cancer      niece  . Heart disease Father   . Hypertension Father   . Emphysema Father   . Coronary artery disease Father   . Colon cancer Neg Hx   . Heart attack Mother   . CVA Mother    Social History  Substance Use Topics  . Smoking status: Former Smoker -- 0.30 packs/day for 3 years    Types: Cigarettes  Start date: 11/08/1964    Quit date: 11/09/1967  . Smokeless tobacco: Never Used  . Alcohol Use: No   OB History    No data available     Review of Systems 10/14 systems reviewed and are negative other than those stated in the HPI   Allergies  Tape  Home Medications   Prior to Admission medications   Medication Sig Start Date End Date Taking? Authorizing Provider  ALPRAZolam (XANAX) 0.25 MG tablet Take 0.25 mg by mouth at bedtime.   Yes Historical Provider, MD  aspirin EC 81 MG tablet Take 81 mg by mouth daily.   Yes Historical Provider, MD  azithromycin (ZITHROMAX) 250 MG tablet Take 250-500 mg by mouth daily. Take 500mg  on day 1, and then 250 daily for 4 days 02/23/15  Yes Historical Provider, MD  calcitRIOL (ROCALTROL) 0.5 MCG capsule Take 0.5 mcg by mouth daily.   Yes Historical Provider, MD  clopidogrel (PLAVIX) 75 MG tablet Take 1 tablet (75 mg total) by mouth daily with breakfast. 07/18/14  Yes Sueanne Margarita, MD  diltiazem (MATZIM LA) 240 MG 24 hr tablet Take 240 mg by mouth every morning.    Yes Historical Provider, MD  doxazosin (CARDURA) 2 MG tablet TAKE 1 TABLET (2 MG TOTAL) BY MOUTH DAILY. 01/29/15  Yes Sueanne Margarita, MD  furosemide (LASIX) 20 MG tablet Take 1  tablet (20 mg total) by mouth every other day. 12/02/14  Yes Sueanne Margarita, MD  isosorbide mononitrate (IMDUR) 60 MG 24 hr tablet TAKE 1 AND 1/2 TABLETS BY MOUTH EVERY DAY Patient taking differently: TAKE 1 AND 1/2 TABLETS= 90 mg BY MOUTH EVERY DAY 12/24/14  Yes Sueanne Margarita, MD  levETIRAcetam (KEPPRA) 500 MG tablet Take 1 tablet (500 mg total) by mouth 2 (two) times daily. 07/29/14  Yes Ward Givens, NP  levothyroxine (SYNTHROID, LEVOTHROID) 100 MCG tablet Take 100 mcg by mouth daily before breakfast.   Yes Historical Provider, MD  loperamide (IMODIUM) 1 MG/5ML solution Take 1 mg by mouth as needed for diarrhea or loose stools.   Yes Historical Provider, MD  losartan (COZAAR) 100 MG tablet Take 100 mg by mouth every morning.    Yes Historical Provider, MD  metoprolol succinate (TOPROL-XL) 50 MG 24 hr tablet Take 50 mg by mouth daily. Take with or immediately following a meal.   Yes Historical Provider, MD  nitroGLYCERIN (NITROSTAT) 0.4 MG SL tablet Place 1 tablet (0.4 mg total) under the tongue every 5 (five) minutes as needed for chest pain. 06/27/14  Yes Sueanne Margarita, MD  ondansetron (ZOFRAN) 8 MG tablet Take 4-8 mg by mouth every 6 (six) hours as needed for nausea or vomiting.   Yes Historical Provider, MD  pantoprazole (PROTONIX) 40 MG tablet TAKE 1 TABLET (40 MG TOTAL) BY MOUTH 2 (TWO) TIMES DAILY. 10/30/14  Yes Irene Shipper, MD  PARoxetine (PAXIL) 30 MG tablet Take 30 mg by mouth daily. 11/17/13  Yes Historical Provider, MD  Phenylephrine-DM-GG (MUCINEX FAST-MAX CONGEST COUGH) 2.5-5-100 MG/5ML LIQD Take 20 mLs by mouth every 4 (four) hours as needed (for cough and congestion).   Yes Historical Provider, MD  potassium chloride SA (K-DUR,KLOR-CON) 20 MEQ tablet Take 1 tablet (20 mEq total) by mouth every other day. Take with Lasix. 02/26/15  Yes [name], MD  rosuvastatin (CRESTOR) 20 MG tablet Take 1 tablet (20 mg total) by mouth every morning. 11/20/14  Yes Sueanne Margarita, MD  albuterol  (PROVENTIL HFA;VENTOLIN HFA)  108 (90 Base) MCG/ACT inhaler Inhale 2 puffs into the lungs every 6 (six) hours as needed for wheezing or shortness of breath. Patient not taking: Reported on 03/01/2015 02/26/15   [name], MD   BP 117/64 mmHg  Pulse 64  Temp(Src) 98 F (36.7 C) (Oral)  Resp 18  SpO2 96% Physical Exam Physical Exam  Nursing note and vitals reviewed. Constitutional: Low energy and appearing fatigued, non-toxic, and in no acute distress Head: Normocephalic and atraumatic.  Mouth/Throat: Oropharynx is clear. Dry mucous membranes Neck: Normal range of motion. Neck supple. No tenderness. Cardiovascular: Normal rate and regular rhythm.   Pulmonary/Chest: Effort normal. Diminished over left base with dry crackles near right base. No conversational dyspnea Abdominal: Soft. There is no tenderness. There is no rebound and no guarding.  Musculoskeletal: Normal range of motion.  Neurological: Alert, no facial droop, fluent speech, moves all extremities symmetrically Skin: Skin is warm and dry.  Psychiatric: Cooperative  ED Course  Procedures (including critical care time) Labs Review Labs Reviewed  BASIC METABOLIC PANEL - Abnormal; Notable for the following:    Potassium 3.3 (*)    Glucose, Bld 118 (*)    Creatinine, Ser 1.07 (*)    Calcium 7.8 (*)    GFR calc non Af Amer 52 (*)    All other components within normal limits  CBC - Abnormal; Notable for the following:    Hemoglobin 10.6 (*)    HCT 32.3 (*)    All other components within normal limits  URINALYSIS, ROUTINE W REFLEX MICROSCOPIC (NOT AT [facility]) - Abnormal; Notable for the following:    Hgb urine dipstick TRACE (*)    All other components within normal limits  MAGNESIUM - Abnormal; Notable for the following:    Magnesium 1.1 (*)    All other components within normal limits  DIFFERENTIAL - Abnormal; Notable for the following:    Monocytes Absolute 1.2 (*)    All other components within normal limits  URINE  MICROSCOPIC-ADD ON - Abnormal; Notable for the following:    Squamous Epithelial / LPF 0-5 (*)    All other components within normal limits  CBG MONITORING, ED - Abnormal; Notable for the following:    Glucose-Capillary 107 (*)    All other components within normal limits  C DIFFICILE QUICK SCREEN W PCR REFLEX  MAGNESIUM  COMPREHENSIVE METABOLIC PANEL  BRAIN NATRIURETIC PEPTIDE  I-STAT TROPOININ, ED    Imaging Review Dg Chest 2 View  03/01/2015  CLINICAL DATA:  Shortness of breath and chest congestion today. EXAM: CHEST  2 VIEW COMPARISON:  02/23/2015. FINDINGS: Normal sized heart. Mildly displaced right lateral seventh rib fracture without pneumothorax. Minimal right pleural fluid. Mitral valve annulus calcifications. Post CABG changes. Right diaphragmatic eventration. Thoracic spine degenerative changes. Mild diffuse peribronchial thickening. IMPRESSION: 1. Mild bronchitic changes. 2. Mildly displaced right lateral seventh rib fracture with a minimal right pleural effusion. Electronically Signed   By: Claudie Revering M.D.   On: 03/01/2015 15:34   Ct Head Wo Contrast  03/01/2015  CLINICAL DATA:  Syncope and fall today 6 hours prior. Patient on Plavix. EXAM: CT HEAD WITHOUT CONTRAST TECHNIQUE: Contiguous axial images were obtained from the base of the skull through the vertex without intravenous contrast. COMPARISON:  Brain MRI 12/05/2013, head CT 11/13/2011 FINDINGS: No intracranial hemorrhage, mass effect, or midline shift. 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 No  hydrocephalus. The basilar cisterns are patent. No evidence of territorial infarct. No intracranial fluid collection. Calvarium is intact. Included paranasal sinuses are clear, minimal opacification of lower right mastoid air cells unchanged. IMPRESSION: No acute intracranial abnormality. Electronically  Signed   By: Jeb Levering M.D.   On: 03/01/2015 19:03   I have personally reviewed and evaluated these images and lab results as part of my medical decision-making.   EKG Interpretation   Date/Time:  Saturday March 01 2015 13:32:57 EST Ventricular Rate:  65 PR Interval:  147 QRS Duration: 88 QT Interval:  454 QTC Calculation: 472 R Axis:   -41 Text Interpretation:  Sinus rhythm Abnormal R-wave progression, early  transition Inferior infarct, old No significant change since last tracing  Confirmed by Fawaz Borquez MD, Indigo Barbian [id number]) on 03/01/2015 2:59:59 PM      MDM   Final diagnoses:  Syncope, unspecified syncope type  Orthostasis  Dehydration    70 year old female who presents with syncopal episode in setting of recent GI and upper respiratory illness. With lower blood pressures in 90s initially, responding to fluids. Appears dry on exam, but otherwise unremarkable exam. Suspect hypovolemia and dehydration as etiology of syncopal episode today. Also taking lasix, cardizem and 2 additional antihypertensive medications during this time, exacerbating orthostasis.  Was orthostatic after 1 L fluids, and even after an additional liter of fluid continues to be orthostatic with complaints of lightheadedness with standing. Blood work overall does show improving renal function from recent admission. She has mild electrolyte derangements and is repleted with potassium and magnesium. Ekg without ischemia or stigmata of arrhythmia. I discussed with [facility] hospitalist who will admit to observation for ongoing fluid hydration.     Forde Dandy, MD 03/01/15 [id number]  Forde Dandy, MD 03/01/15 2152

## 2015-03-02 ENCOUNTER — Encounter (HOSPITAL_COMMUNITY): Payer: Self-pay | Admitting: *Deleted

## 2015-03-02 DIAGNOSIS — I951 Orthostatic hypotension: Secondary | ICD-10-CM | POA: Diagnosis not present

## 2015-03-02 DIAGNOSIS — E860 Dehydration: Secondary | ICD-10-CM | POA: Diagnosis not present

## 2015-03-02 DIAGNOSIS — E876 Hypokalemia: Secondary | ICD-10-CM

## 2015-03-02 DIAGNOSIS — R531 Weakness: Secondary | ICD-10-CM

## 2015-03-02 DIAGNOSIS — J9611 Chronic respiratory failure with hypoxia: Secondary | ICD-10-CM

## 2015-03-02 LAB — MAGNESIUM: Magnesium: 1.5 mg/dL — ABNORMAL LOW (ref 1.7–2.4)

## 2015-03-02 LAB — COMPREHENSIVE METABOLIC PANEL
ALK PHOS: 66 U/L (ref 38–126)
ALT: 8 U/L — AB (ref 14–54)
AST: 12 U/L — ABNORMAL LOW (ref 15–41)
Albumin: 2.5 g/dL — ABNORMAL LOW (ref 3.5–5.0)
Anion gap: 8 (ref 5–15)
BUN: 12 mg/dL (ref 6–20)
CALCIUM: 7 mg/dL — AB (ref 8.9–10.3)
CO2: 21 mmol/L — AB (ref 22–32)
CREATININE: 0.85 mg/dL (ref 0.44–1.00)
Chloride: 110 mmol/L (ref 101–111)
GFR calc non Af Amer: >60 mL/min (ref >60)
Glucose, Bld: 88 mg/dL (ref 65–99)
Potassium: 3.9 mmol/L (ref 3.5–5.1)
SODIUM: 139 mmol/L (ref 135–145)
TOTAL PROTEIN: 5.7 g/dL — AB (ref 6.5–8.1)
Total Bilirubin: 0.9 mg/dL (ref 0.3–1.2)

## 2015-03-02 LAB — GLUCOSE, CAPILLARY: Glucose-Capillary: 79 mg/dL (ref 65–99)

## 2015-03-02 MED ORDER — MAGNESIUM SULFATE 2 GM/50ML IV SOLN
2.0000 g | Freq: Once | INTRAVENOUS | Status: AC
  Administered 2015-03-02: 2 g via INTRAVENOUS
  Filled 2015-03-02: qty 50

## 2015-03-02 MED ORDER — SODIUM CHLORIDE 0.9 % IV SOLN
INTRAVENOUS | Status: DC
  Filled 2015-03-02: qty 1000

## 2015-03-02 MED ORDER — CETYLPYRIDINIUM CHLORIDE 0.05 % MT LIQD
7.0000 mL | Freq: Two times a day (BID) | OROMUCOSAL | Status: DC
  Administered 2015-03-02 – 2015-03-03 (×3): 7 mL via OROMUCOSAL

## 2015-03-02 MED ORDER — SODIUM CHLORIDE 0.9 % IV SOLN
INTRAVENOUS | Status: DC
  Administered 2015-03-02 – 2015-03-03 (×3): via INTRAVENOUS

## 2015-03-02 NOTE — Progress Notes (Addendum)
PROGRESS NOTE  [name] [id number] DOB: 1945-01-28 DOA: 03/01/2015 PCP: Reginia Naas, MD  Brief History 70 y.o. female with PMH of coronary artery disease with 3 DES placed in 2011, chronic respiratory failure with supplemental oxygen requirement at night, depression, anxiety, and hypothyroidism who presents to the hospital following a syncopal event at home. Patient was just admitted to this institution from 02/23/2015 - 02/26/2015 with generalized weakness and underwent right VATS with open lung biopsy, pathology without clear diagnosis, but no findings suggestive of interstitial fibrosis, but rather suggestive of chronic bronchitis with some granulomatous features. She was also treated for hypokalemia and acute diarrhea, both of which resolved by time of discharge. She was placed on a azithromycin by her primary care provider of which she took 3 days prior to this admission.2 days prior to this admission, the patient began developing nausea, vomiting, and diarrhea.  She had increasing generalized weakness and dyspnea. As result, she presented to the emergency department. Orthostatic vital signs were positive, and the patient was started on intravenous fluids. She was given 2 L NS in ED and continued on maintenance fluids.  Assessment/Plan: Syncope secondary to orthostatic hypotension  - Occurs in the setting of acute gastroenteritis with diarrhea, vomiting, and poor PO intake; she continues numerous antihypertensives and Lasix  - Orthostatic vitals positive in ED with with ~30 mm Hg drop in SBP, and 30 bpm increase in HR; orthostasis persists after 2 L NS bolus in ED  -10/15/14--TTE completed just 4 mos ago with no significant structural heart disease ;  EF 55-60% grade 1 DD - Hold antihypertensives and Lasix, continue IVF hydration with NS + K  - Repeat orthostatic vitals qShift  - resume metoprolol at lower dose but hold diltiazem, Cardura, furosemide, losartan -  Fall precautions   Dehydration with hypokalemia, hypomagnesemia  - Secondary to GI losses in setting of acute GE with vomiting and diarrhea  - Fluid resuscitating, replete lytes  - Repeat chem panel in am, continue replacement prn - Holding Lasix and antihypertensives at this time   Acute viral gastroenteritis  - Pt is non-toxic, afebrile, without leukocytosis, and abd exam is benign, and diarrhea non-bloody  - This is day 3-4 of illness - If fails to improve/resolve as expected, may need to extend w/u-->no vomiting or diarrhea since admission  - Replacing fluids and lytes as above  - Was on abx during recent hospitalization, will screen for C diff, treat if positive  Chronic respiratory failure with hypoxia - Requires 2 Lpm supplemental O2 at night--presently stable on 2L - Followed by Dr. Chase Caller and previously thought secondary to ILD  - She underwent Rt VATS with open lung biopsy 02/24/15 with Dr. Prescott Gum; path not consistent with ILD, but more suggestive of bronchitis with a granulomatous component  - Dr. Chase Caller has referred her to [facility] for second opinion; appt date not yet determined  - CXR without acute findings, pt in no apparent respiratory distress  - Continue supplemental O2 prn sats <92%  - Husband requests pulmonary consultation--I discussed with Dr. Ander Slade will see on 03/03/15  Seizure disorder  - Continue home-dose Keppra   Hypothyroidism  - Continue home-dose Synthroid   Normocytic anemia  - Hgb 10.6 on admission, consistent with her baseline  - Likely secondary to chronic disease  - No sign of active blood loss - Monitor   Hypokalemia/Hypomagnesemia -secondary to GI losses -Replete    Family Communication:  Husband updated at beside Disposition Plan:   Home 1-2 days       Procedures/Studies: Dg Chest 2 View  03/01/2015  CLINICAL DATA:  Shortness of breath and chest congestion today. EXAM: CHEST  2 VIEW  COMPARISON:  02/23/2015. FINDINGS: Normal sized heart. Mildly displaced right lateral seventh rib fracture without pneumothorax. Minimal right pleural fluid. Mitral valve annulus calcifications. Post CABG changes. Right diaphragmatic eventration. Thoracic spine degenerative changes. Mild diffuse peribronchial thickening. IMPRESSION: 1. Mild bronchitic changes. 2. Mildly displaced right lateral seventh rib fracture with a minimal right pleural effusion. Electronically Signed   By: Claudie Revering M.D.   On: 03/01/2015 15:34   Dg Chest 2 View  02/19/2015  CLINICAL DATA:  Shortness of breath. Chest pain. Former smoker. Coronary artery disease. Interstitial lung disease. Lung biopsy in January 2017. EXAM: CHEST  2 VIEW COMPARISON:  01/27/2015 FINDINGS: Improved basilar it and lingular airspace opacities with currently only mild scarring or atelectasis medially in the right lung base, in the lingula, and in the right perihilar region. This represents a significant improvement. Prior median sternotomy. Mildly elevated right hemidiaphragm. Right seventh rib discontinuity laterally, possibly from osteotomy or prior fracture. No pleural effusion observed. Thoracic spondylosis. IMPRESSION: 1. Significantly improved opacities, with only mild residual scarring or subsegmental atelectasis as noted above. 2. Mildly elevated right hemidiaphragm. 3. Right lateral seventh rib discontinuity compatible with osteotomy or fracture, stable. Electronically Signed   By: Van Clines M.D.   On: 02/19/2015 11:19   Ct Head Wo Contrast  03/01/2015  CLINICAL DATA:  Syncope and fall today 6 hours prior. Patient on Plavix. EXAM: CT HEAD WITHOUT CONTRAST TECHNIQUE: Contiguous axial images were obtained from the base of the skull through the vertex without intravenous contrast. COMPARISON:  Brain MRI 12/05/2013, head CT 11/13/2011 FINDINGS: No intracranial hemorrhage, mass effect, or midline shift. 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 No hydrocephalus. The basilar cisterns are patent. No evidence of territorial infarct. No intracranial fluid collection. Calvarium is intact. Included paranasal sinuses are clear, minimal opacification of lower right mastoid air cells unchanged. IMPRESSION: No acute intracranial abnormality. Electronically Signed   By: Jeb Levering M.D.   On: 03/01/2015 19:03   Dg Chest Port 1 View  02/23/2015  CLINICAL DATA:  Shortness of breath for 4 days EXAM: PORTABLE CHEST 1 VIEW COMPARISON:  02/19/2015 FINDINGS: Cardiac shadow is stable. Elevation of the right hemidiaphragm is again seen. Minimal platelike atelectasis is noted bilaterally stable from the previous exam. No sizable effusion or infiltrate is noted. Postsurgical changes are noted. The bony structures are within normal limits. IMPRESSION: No change from the prior study. Electronically Signed   By: Inez Catalina M.D.   On: 02/23/2015 18:05         Subjective: Patient states that vomiting and diarrhea have improved without any further bowel movements or vomiting since admission. She still feels a little nauseous. Denies any fevers, chills, chest pain, headache, neck pain, rashes.She has some dyspnea on exertion.  Objective: Filed Vitals:   03/01/15 2217 03/02/15 0212 03/02/15 0529 03/02/15 1019  BP: 118/70 115/64 128/61 145/68  Pulse: 63 62 73 70  Temp: 98.1 F (36.7 C) 98.2 F (36.8 C) 98.6 F (37 C) 98.4 F (36.9 C)  TempSrc: Oral Oral Oral Oral  Resp: 18 16 16 18   Height: 4\' 11"  (1.499 m)     Weight: 63.8 kg (  140 lb 10.5 oz)     SpO2: 100% 98% 100% 96%    Intake/Output Summary (Last 24 hours) at 03/02/15 1121 Last data filed at 03/02/15 0900  Gross per 24 hour  Intake 928.33 ml  Output    700 ml  Net 228.33 ml   Weight change:  Exam:   General:  Pt is alert, follows commands appropriately, not  in acute distress  HEENT: No icterus, No thrush, No neck mass, [place]/AT  Cardiovascular: RRR, S1/S2, no rubs, no gallops  Respiratory: bibasilar crackles without wheezing. Good air movement.  Abdomen: Soft/+BS, non tender, non distended, no guarding; no hepatosplenomegaly  Extremities: trace LE edema, No lymphangitis, No petechiae, No rashes, no synovitis; no cyanosis  Data Reviewed: Basic Metabolic Panel:  Recent Labs Lab 02/23/15 1747 02/24/15 0346 02/25/15 0524 02/26/15 0501 03/01/15 1348 03/01/15 1400 03/02/15 0556  NA 134* 142 142 140 138  --  139  K 2.9* 3.9 3.3* 4.6 3.3*  --  3.9  CL 105 113* 112* 112* 104  --  110  CO2 18* 19* 19* 22 22  --  21*  GLUCOSE 121* 106* 148* 126* 118*  --  88  BUN 23* 19 11 12 16   --  12  CREATININE 1.89* 1.52* 1.14* 1.11* 1.07*  --  0.85  CALCIUM 7.1* 7.5* 8.0* 8.2* 7.8*  --  7.0*  MG 0.7*  --  2.0  --   --  1.1* 1.5*   Liver Function Tests:  Recent Labs Lab 02/23/15 1747 03/02/15 0556  AST 28 12*  ALT 12* 8*  ALKPHOS 79 66  BILITOT 0.6 0.9  PROT 6.5 5.7*  ALBUMIN 3.2* 2.5*   No results for input(s): LIPASE, AMYLASE in the last 168 hours. No results for input(s): AMMONIA in the last 168 hours. CBC:  Recent Labs Lab 02/23/15 1747 02/24/15 0346 02/25/15 0524 02/26/15 0501 03/01/15 1348  WBC 5.1 4.0 4.2 8.6 9.7  NEUTROABS 3.3  --   --   --  6.6  HGB 10.0* 8.7* 9.3* 9.2* 10.6*  HCT 30.0* 27.6* 29.2* 28.7* 32.3*  MCV 80.9 85.4 84.4 85.4 81.6  PLT 174 156 156 188 284   Cardiac Enzymes: No results for input(s): CKTOTAL, CKMB, CKMBINDEX, TROPONINI in the last 168 hours. BNP: Invalid input(s): POCBNP CBG:  Recent Labs Lab 03/01/15 1359 03/02/15 0656  GLUCAP 107* 79    Recent Results (from the past 240 hour(s))  Culture, blood (Routine X 2) w Reflex to ID Panel     Status: None   Collection Time: 02/23/15  5:49 PM  Result Value Ref Range Status   Specimen Description BLOOD BLOOD RIGHT HAND  Final   Special  Requests BOTTLES DRAWN AEROBIC AND ANAEROBIC 5CC  Final   Culture  Setup Time   Final    GRAM POSITIVE COCCI IN CLUSTERS ANAEROBIC BOTTLE ONLY CRITICAL RESULT CALLED TO, READ BACK BY AND VERIFIED WITH: SMITH RN 17:55 02/24/15 ([email or web address])    Culture   Final    STAPHYLOCOCCUS SPECIES (COAGULASE NEGATIVE) THE SIGNIFICANCE OF ISOLATING THIS ORGANISM FROM A SINGLE SET OF BLOOD CULTURES WHEN MULTIPLE SETS ARE DRAWN IS UNCERTAIN. PLEASE NOTIFY THE MICROBIOLOGY DEPARTMENT WITHIN ONE WEEK IF SPECIATION AND SENSITIVITIES ARE REQUIRED. Performed at [facility]    Report Status 02/26/2015 FINAL  Final  Culture, blood (Routine X 2) w Reflex to ID Panel     Status: None   Collection Time: 02/23/15  5:56 PM  Result Value Ref Range Status  Specimen Description BLOOD LEFT ANTECUBITAL  Final   Special Requests BOTTLES DRAWN AEROBIC AND ANAEROBIC 5CC  Final   Culture   Final    NO GROWTH 5 DAYS Performed at [facility]    Report Status 02/28/2015 FINAL  Final  MRSA PCR Screening     Status: None   Collection Time: 02/23/15  9:12 PM  Result Value Ref Range Status   MRSA by PCR NEGATIVE NEGATIVE Final    Comment:        The GeneXpert MRSA Assay (FDA approved for NASAL specimens only), is one component of a comprehensive MRSA colonization surveillance program. It is not intended to diagnose MRSA infection nor to guide or monitor treatment for MRSA infections.      Scheduled Meds: . ALPRAZolam  0.25 mg Oral QHS  . antiseptic oral rinse  7 mL Mouth Rinse BID  . aspirin EC  81 mg Oral Daily  . calcitRIOL  0.5 mcg Oral Daily  . clopidogrel  75 mg Oral Q breakfast  . enoxaparin (LOVENOX) injection  40 mg Subcutaneous QHS  . isosorbide mononitrate  90 mg Oral Daily  . levETIRAcetam  500 mg Oral BID  . levothyroxine  100 mcg Oral QAC breakfast  . metoprolol tartrate  12.5 mg Oral BID  . pantoprazole  40 mg Oral Daily  . PARoxetine  30 mg Oral Daily  . rosuvastatin  20 mg Oral  q1800  . sodium chloride flush  3 mL Intravenous Q12H   Continuous Infusions:    Kathryn Cosby, DO  [facility] Pager [phone]  If 7PM-7AM, please contact night-coverage [email or web address] Password [facility] 03/02/2015, 11:21 AM

## 2015-03-03 ENCOUNTER — Telehealth: Payer: Self-pay | Admitting: Internal Medicine

## 2015-03-03 DIAGNOSIS — J9611 Chronic respiratory failure with hypoxia: Secondary | ICD-10-CM

## 2015-03-03 DIAGNOSIS — I951 Orthostatic hypotension: Secondary | ICD-10-CM | POA: Diagnosis not present

## 2015-03-03 DIAGNOSIS — R531 Weakness: Secondary | ICD-10-CM | POA: Diagnosis not present

## 2015-03-03 DIAGNOSIS — E860 Dehydration: Secondary | ICD-10-CM | POA: Diagnosis not present

## 2015-03-03 LAB — CBC
HCT: 27.4 % — ABNORMAL LOW (ref 36.0–46.0)
Hemoglobin: 8.7 g/dL — ABNORMAL LOW (ref 12.0–15.0)
MCH: 26.9 pg (ref 26.0–34.0)
MCHC: 31.8 g/dL (ref 30.0–36.0)
MCV: 84.8 fL (ref 78.0–100.0)
Platelets: 317 K/uL (ref 150–400)
RBC: 3.23 MIL/uL — ABNORMAL LOW (ref 3.87–5.11)
RDW: 14.4 % (ref 11.5–15.5)
WBC: 8.1 K/uL (ref 4.0–10.5)

## 2015-03-03 LAB — GLUCOSE, CAPILLARY: GLUCOSE-CAPILLARY: 102 mg/dL — AB (ref 65–99)

## 2015-03-03 LAB — BASIC METABOLIC PANEL WITH GFR
Anion gap: 9 (ref 5–15)
BUN: 6 mg/dL (ref 6–20)
CO2: 24 mmol/L (ref 22–32)
Calcium: 7.5 mg/dL — ABNORMAL LOW (ref 8.9–10.3)
Chloride: 109 mmol/L (ref 101–111)
Creatinine, Ser: 0.95 mg/dL (ref 0.44–1.00)
GFR calc Af Amer: >60 mL/min (ref >60)
GFR calc non Af Amer: >60 mL/min (ref >60)
Glucose, Bld: 103 mg/dL — ABNORMAL HIGH (ref 65–99)
Potassium: 3.3 mmol/L — ABNORMAL LOW (ref 3.5–5.1)
Sodium: 142 mmol/L (ref 135–145)

## 2015-03-03 LAB — MAGNESIUM: Magnesium: 1.7 mg/dL (ref 1.7–2.4)

## 2015-03-03 MED ORDER — METOPROLOL SUCCINATE ER 50 MG PO TB24
50.0000 mg | ORAL_TABLET | Freq: Every day | ORAL | Status: DC
  Administered 2015-03-03: 50 mg via ORAL
  Filled 2015-03-03: qty 1

## 2015-03-03 MED ORDER — POTASSIUM CHLORIDE CRYS ER 20 MEQ PO TBCR
20.0000 meq | EXTENDED_RELEASE_TABLET | Freq: Once | ORAL | Status: AC
  Administered 2015-03-03: 20 meq via ORAL
  Filled 2015-03-03: qty 1

## 2015-03-03 MED ORDER — MAGNESIUM SULFATE 2 GM/50ML IV SOLN
2.0000 g | Freq: Once | INTRAVENOUS | Status: AC
  Administered 2015-03-03: 2 g via INTRAVENOUS
  Filled 2015-03-03: qty 50

## 2015-03-03 MED ORDER — ONDANSETRON HCL 4 MG/2ML IJ SOLN
4.0000 mg | Freq: Once | INTRAMUSCULAR | Status: AC
  Administered 2015-03-03: 4 mg via INTRAVENOUS
  Filled 2015-03-03: qty 2

## 2015-03-03 NOTE — Discharge Summary (Signed)
Physician Discharge Summary  [name] [id number] DOB: 1945-08-23 DOA: 03/01/2015  PCP: Reginia Naas, MD  Admit date: 03/01/2015 Discharge date: 03/03/2015  Recommendations for Outpatient Follow-up:  1. Pt will need to follow up with PCP in 2 weeks post discharge 2. Please obtain BMP in one week Discharge Diagnoses:  Syncope secondary to orthostatic hypotension  - Occurs in the setting of acute gastroenteritis with diarrhea, vomiting, and poor PO intake; she continues numerous antihypertensives and Lasix  - Orthostatic vitals positive in ED with with ~30 mm Hg drop in SBP, and 30 bpm increase in HR; orthostasis persists after 2 L NS bolus in ED  -10/15/14--TTE completed just 4 mos ago with no significant structural heart disease ; EF 55-60% grade 1 DD - Hold antihypertensives and Lasix, continue IVF hydration with NS + K  - 03/03/15 Repeat orthostatic vitals--lying-165/100 HR 76; sitting 154/76 HR 78; standing 149/80 HR 80 - significant clinical improvement with fluids - no further N/V/D x >24 hours - will need to tolerate higher BP at baseline -restart metoprolol succinate, cardizem, losartan - will not restart cardura - Fall precautions   Dehydration with hypokalemia, hypomagnesemia  - Secondary to GI losses in setting of acute GE with vomiting and diarrhea  - Fluid resuscitating, replete lytes  - Holding Lasix and antihypertensives initially -improved  Acute viral gastroenteritis  - Pt is non-toxic, afebrile, without leukocytosis, and abd exam is benign, and diarrhea non-bloody  - This is day 3-4 of illness - If fails to improve/resolve as expected, may need to extend w/u-->no vomiting or diarrhea since admission  - Replacing fluids and lytes as above  - Was on abx during recent hospitalization, screen for C diff, treat if positive-->no loose BM to collect - no further N/V/D x >24 hours - tolerating diet  Chronic respiratory failure with  hypoxia - Requires 2 Lpm supplemental O2 at night--presently stable on RA - Followed by Dr. Chase Caller and previously thought secondary to ILD  - She underwent Rt VATS with open lung biopsy 02/24/15 with Dr. Prescott Gum; path not consistent with ILD, but more suggestive of bronchitis with a granulomatous component  - Dr. Chase Caller has referred her to [facility] for second opinion; appt date not yet determined  - CXR without acute findings, pt in no apparent respiratory distress  - Continue supplemental O2 prn sats <92%   Seizure disorder  - Continue home-dose Keppra   Hypothyroidism  - Continue home-dose Synthroid   Normocytic anemia  - Hgb 10.6 on admission, consistent with her baseline  - Likely secondary to chronic disease  - No sign of active blood loss - drop in Hgb secondary to dilution  Hypokalemia/Hypomagnesemia -secondary to GI losses -Repleted  Discharge Condition: stable  Disposition: home  Diet:heart healthy Wt Readings from Last 3 Encounters:  03/03/15 64.093 kg (141 lb 4.8 oz)  02/23/15 63.504 kg (140 lb)  02/19/15 64.864 kg (143 lb)    History of present illness:  70 y.o. female with PMH of coronary artery disease with 3 DES placed in 2011, chronic respiratory failure with supplemental oxygen requirement at night, depression, anxiety, and hypothyroidism who presents to the hospital following a syncopal event at home. Patient was just admitted to this institution from 02/23/2015 - 02/26/2015 with generalized weakness.  She underwent right VATS with lung biopsy 01/24/15;  pathology without clear diagnosis, but no findings suggestive of interstitial fibrosis, but rather suggestive of chronic bronchitis with some granulomatous features. She was also treated for hypokalemia and  acute diarrhea, both of which resolved by time of discharge. She was placed on a azithromycin by her primary care provider of which she took 3 days prior to this admission.2 days prior to this  admission, the patient began developing nausea, vomiting, and diarrhea. She had increasing generalized weakness and dyspnea. As result, she presented to the emergency department. Orthostatic vital signs were positive, and the patient was started on intravenous fluids. She was given 2 L NS in ED and continued on maintenance fluids.  She improved clinically.  She had no further n/v/d for more than 24 hours during the hospitalization.  Diet was advanced which she tolerated.  Her repeat orthostatics showed improvement.  Case discussed with pulmonary, and they felt pt was clinically stable to f/u with Dr. Chase Caller in the office.  Consultants: pulmonary  Discharge Exam: Filed Vitals:   03/03/15 0224 03/03/15 0500  BP: 154/71 179/72  Pulse: 75 73  Temp: 98.6 F (37 C) 98.3 F (36.8 C)  Resp: 16 16   Filed Vitals:   03/02/15 1504 03/02/15 2030 03/03/15 0224 03/03/15 0500  BP: 150/66 156/72 154/71 179/72  Pulse: 68 73 75 73  Temp: 98.2 F (36.8 C) 98.2 F (36.8 C) 98.6 F (37 C) 98.3 F (36.8 C)  TempSrc: Oral Oral Oral Oral  Resp: 20 18 16 16   Height:      Weight:    64.093 kg (141 lb 4.8 oz)  SpO2: 98% 100% 99% 100%   General: A&O x 3, NAD, pleasant, cooperative Cardiovascular: RRR, no rub, no gallop, no S3 Respiratory: bibasilar crackles without wheeze Abdomen:soft, nontender, nondistended, positive bowel sounds Extremities: No edema, No lymphangitis, no petechiae  Discharge Instructions      Discharge Instructions    Diet - low sodium heart healthy    Complete by:  As directed      Increase activity slowly    Complete by:  As directed             Medication List    STOP taking these medications        azithromycin 250 MG tablet  Commonly known as:  ZITHROMAX     doxazosin 2 MG tablet  Commonly known as:  CARDURA      TAKE these medications        albuterol 108 (90 Base) MCG/ACT inhaler  Commonly known as:  PROVENTIL HFA;VENTOLIN HFA  Inhale 2 puffs into  the lungs every 6 (six) hours as needed for wheezing or shortness of breath.     ALPRAZolam 0.25 MG tablet  Commonly known as:  XANAX  Take 0.25 mg by mouth at bedtime.     aspirin EC 81 MG tablet  Take 81 mg by mouth daily.     calcitRIOL 0.5 MCG capsule  Commonly known as:  ROCALTROL  Take 0.5 mcg by mouth daily.     clopidogrel 75 MG tablet  Commonly known as:  PLAVIX  Take 1 tablet (75 mg total) by mouth daily with breakfast.     furosemide 20 MG tablet  Commonly known as:  LASIX  Take 1 tablet (20 mg total) by mouth every other day.     isosorbide mononitrate 60 MG 24 hr tablet  Commonly known as:  IMDUR  TAKE 1 AND 1/2 TABLETS BY MOUTH EVERY DAY     levETIRAcetam 500 MG tablet  Commonly known as:  KEPPRA  Take 1 tablet (500 mg total) by mouth 2 (two) times daily.  levothyroxine 100 MCG tablet  Commonly known as:  SYNTHROID, LEVOTHROID  Take 100 mcg by mouth daily before breakfast.     loperamide 1 MG/5ML solution  Commonly known as:  IMODIUM  Take 1 mg by mouth as needed for diarrhea or loose stools.     losartan 100 MG tablet  Commonly known as:  COZAAR  Take 100 mg by mouth every morning.     MATZIM LA 240 MG 24 hr tablet  Generic drug:  diltiazem  Take 240 mg by mouth every morning.     metoprolol succinate 50 MG 24 hr tablet  Commonly known as:  TOPROL-XL  Take 50 mg by mouth daily. Take with or immediately following a meal.     MUCINEX FAST-MAX CONGEST COUGH 2.5-5-100 MG/5ML Liqd  Generic drug:  Phenylephrine-DM-GG  Take 20 mLs by mouth every 4 (four) hours as needed (for cough and congestion).     nitroGLYCERIN 0.4 MG SL tablet  Commonly known as:  NITROSTAT  Place 1 tablet (0.4 mg total) under the tongue every 5 (five) minutes as needed for chest pain.     ondansetron 8 MG tablet  Commonly known as:  ZOFRAN  Take 4-8 mg by mouth every 6 (six) hours as needed for nausea or vomiting.     pantoprazole 40 MG tablet  Commonly known as:   PROTONIX  TAKE 1 TABLET (40 MG TOTAL) BY MOUTH 2 (TWO) TIMES DAILY.     PARoxetine 30 MG tablet  Commonly known as:  PAXIL  Take 30 mg by mouth daily.     potassium chloride SA 20 MEQ tablet  Commonly known as:  K-DUR,KLOR-CON  Take 1 tablet (20 mEq total) by mouth every other day. Take with Lasix.     rosuvastatin 20 MG tablet  Commonly known as:  CRESTOR  Take 1 tablet (20 mg total) by mouth every morning.         The results of significant diagnostics from this hospitalization (including imaging, microbiology, ancillary and laboratory) are listed below for reference.    Significant Diagnostic Studies: Dg Chest 2 View  03/01/2015  CLINICAL DATA:  Shortness of breath and chest congestion today. EXAM: CHEST  2 VIEW COMPARISON:  02/23/2015. FINDINGS: Normal sized heart. Mildly displaced right lateral seventh rib fracture without pneumothorax. Minimal right pleural fluid. Mitral valve annulus calcifications. Post CABG changes. Right diaphragmatic eventration. Thoracic spine degenerative changes. Mild diffuse peribronchial thickening. IMPRESSION: 1. Mild bronchitic changes. 2. Mildly displaced right lateral seventh rib fracture with a minimal right pleural effusion. Electronically Signed   By: Claudie Revering M.D.   On: 03/01/2015 15:34   Dg Chest 2 View  02/19/2015  CLINICAL DATA:  Shortness of breath. Chest pain. Former smoker. Coronary artery disease. Interstitial lung disease. Lung biopsy in January 2017. EXAM: CHEST  2 VIEW COMPARISON:  01/27/2015 FINDINGS: Improved basilar it and lingular airspace opacities with currently only mild scarring or atelectasis medially in the right lung base, in the lingula, and in the right perihilar region. This represents a significant improvement. Prior median sternotomy. Mildly elevated right hemidiaphragm. Right seventh rib discontinuity laterally, possibly from osteotomy or prior fracture. No pleural effusion observed. Thoracic spondylosis. IMPRESSION: 1.  Significantly improved opacities, with only mild residual scarring or subsegmental atelectasis as noted above. 2. Mildly elevated right hemidiaphragm. 3. Right lateral seventh rib discontinuity compatible with osteotomy or fracture, stable. Electronically Signed   By: Van Clines M.D.   On: 02/19/2015 11:19   Ct Head  Wo Contrast  03/01/2015  CLINICAL DATA:  Syncope and fall today 6 hours prior. Patient on Plavix. EXAM: CT HEAD WITHOUT CONTRAST TECHNIQUE: Contiguous axial images were obtained from the base of the skull through the vertex without intravenous contrast. COMPARISON:  Brain MRI 12/05/2013, head CT 11/13/2011 FINDINGS: No intracranial hemorrhage, mass effect, or midline shift. 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 No hydrocephalus. The basilar cisterns are patent. No evidence of territorial infarct. No intracranial fluid collection. Calvarium is intact. Included paranasal sinuses are clear, minimal opacification of lower right mastoid air cells unchanged. IMPRESSION: No acute intracranial abnormality. Electronically Signed   By: Jeb Levering M.D.   On: 03/01/2015 19:03   Dg Chest Port 1 View  02/23/2015  CLINICAL DATA:  Shortness of breath for 4 days EXAM: PORTABLE CHEST 1 VIEW COMPARISON:  02/19/2015 FINDINGS: Cardiac shadow is stable. Elevation of the right hemidiaphragm is again seen. Minimal platelike atelectasis is noted bilaterally stable from the previous exam. No sizable effusion or infiltrate is noted. Postsurgical changes are noted. The bony structures are within normal limits. IMPRESSION: No change from the prior study. Electronically Signed   By: Inez Catalina M.D.   On: 02/23/2015 18:05     Microbiology: Recent Results (from the past 240 hour(s))  Culture, blood (Routine X 2) w Reflex to ID Panel     Status: None   Collection Time: 02/23/15   5:49 PM  Result Value Ref Range Status   Specimen Description BLOOD BLOOD RIGHT HAND  Final   Special Requests BOTTLES DRAWN AEROBIC AND ANAEROBIC 5CC  Final   Culture  Setup Time   Final    GRAM POSITIVE COCCI IN CLUSTERS ANAEROBIC BOTTLE ONLY CRITICAL RESULT CALLED TO, READ BACK BY AND VERIFIED WITH: SMITH RN 17:55 02/24/15 ([email or web address])    Culture   Final    STAPHYLOCOCCUS SPECIES (COAGULASE NEGATIVE) THE SIGNIFICANCE OF ISOLATING THIS ORGANISM FROM A SINGLE SET OF BLOOD CULTURES WHEN MULTIPLE SETS ARE DRAWN IS UNCERTAIN. PLEASE NOTIFY THE MICROBIOLOGY DEPARTMENT WITHIN ONE WEEK IF SPECIATION AND SENSITIVITIES ARE REQUIRED. Performed at [facility]    Report Status 02/26/2015 FINAL  Final  Culture, blood (Routine X 2) w Reflex to ID Panel     Status: None   Collection Time: 02/23/15  5:56 PM  Result Value Ref Range Status   Specimen Description BLOOD LEFT ANTECUBITAL  Final   Special Requests BOTTLES DRAWN AEROBIC AND ANAEROBIC 5CC  Final   Culture   Final    NO GROWTH 5 DAYS Performed at [facility]    Report Status 02/28/2015 FINAL  Final  MRSA PCR Screening     Status: None   Collection Time: 02/23/15  9:12 PM  Result Value Ref Range Status   MRSA by PCR NEGATIVE NEGATIVE Final    Comment:        The GeneXpert MRSA Assay (FDA approved for NASAL specimens only), is one component of a comprehensive MRSA colonization surveillance program. It is not intended to diagnose MRSA infection nor to guide or monitor treatment for MRSA infections.      Labs: Basic Metabolic Panel:  Recent Labs Lab 02/25/15 0524 02/26/15 0501 03/01/15 1348 03/01/15 1400 03/02/15 0556 03/03/15 0504  NA 142 140 138  --  139 142  K 3.3* 4.6 3.3*  --  3.9 3.3*  CL 112* 112* 104  --  110 109  CO2 19* 22 22  --  21* 24  GLUCOSE 148* 126* 118*  --  88 103*  BUN 11 12 16   --  12 6  CREATININE 1.14* 1.11* 1.07*  --  0.85 0.95  CALCIUM 8.0* 8.2* 7.8*  --  7.0* 7.5*  MG 2.0  --    --  1.1* 1.5* 1.7   Liver Function Tests:  Recent Labs Lab 03/02/15 0556  AST 12*  ALT 8*  ALKPHOS 66  BILITOT 0.9  PROT 5.7*  ALBUMIN 2.5*   No results for input(s): LIPASE, AMYLASE in the last 168 hours. No results for input(s): AMMONIA in the last 168 hours. CBC:  Recent Labs Lab 02/25/15 0524 02/26/15 0501 03/01/15 1348 03/03/15 0504  WBC 4.2 8.6 9.7 8.1  NEUTROABS  --   --  6.6  --   HGB 9.3* 9.2* 10.6* 8.7*  HCT 29.2* 28.7* 32.3* 27.4*  MCV 84.4 85.4 81.6 84.8  PLT 156 188 284 317   Cardiac Enzymes: No results for input(s): CKTOTAL, CKMB, CKMBINDEX, TROPONINI in the last 168 hours. BNP: Invalid input(s): POCBNP CBG:  Recent Labs Lab 03/01/15 1359 03/02/15 0656  GLUCAP 107* 79    Time coordinating discharge:  Greater than 30 minutes  Signed:  Gerritt Galentine, DO [facility] Pager: [phone] 03/03/2015, 8:24 AM

## 2015-03-03 NOTE — Evaluation (Signed)
Physical Therapy Evaluation Patient Details Name: [name] MRN: [id number] DOB: 1945-03-10 Today's Date: 03/03/2015   History of Present Illness  [name] is a 70 y.o. female with a history of ILD, CAD. HTN, Seizures and Hypothyroid who presents to the ED with complaints of increased weakness and diarrhea for the past 5 days  Clinical Impression  Patient evaluated by Physical Therapy with no further acute PT needs identified. All education has been completed and the patient has no further questions.  See below for any follow-up Physical Therapy or equipment needs. PT is signing off. Thank you for this referral. Discussed increasing activity at home with pt and husband; they do not feel she needs HHPT, she does have RW and may benefit from using this initially; no further needs at this time    Follow Up Recommendations No PT follow up (pt and husb decline HHPT)    Equipment Recommendations  None recommended by PT (has RW)    Recommendations for Other Services       Precautions / Restrictions Precautions Precautions: Fall Precaution Comments: 1 fall since last adm Restrictions Weight Bearing Restrictions: No      Mobility  Bed Mobility Overal bed mobility: Modified Independent             General bed mobility comments: HOB up 40*  Transfers Overall transfer level: Needs assistance Equipment used: None Transfers: Sit to/from Stand Sit to Stand: Supervision         General transfer comment: for safety and lines  Ambulation/Gait Ambulation/Gait assistance: Min guard Ambulation Distance (Feet): 70 Feet Assistive device: Rolling walker (2 wheeled) Gait Pattern/deviations: Step-through pattern;Decreased stride length   Gait velocity interpretation: Below normal speed for age/gender General Gait Details: pt denies SOB; occaisonal cues for safe use of RW; O2 sats 99-97% on RA  Stairs            Wheelchair Mobility    Modified Rankin (Stroke  Patients Only)       Balance Overall balance assessment: History of Falls   Sitting balance-Leahy Scale: Good       Standing balance-Leahy Scale: Fair                               Pertinent Vitals/Pain Pain Assessment: No/denies pain    Home Living Family/patient expects to be discharged to:: Private residence Living Arrangements: Spouse/significant other Available Help at Discharge: Family Type of Home: House   Entrance Stairs-Rails: Right Entrance Stairs-Number of Steps: 6 Home Layout: One level Home Equipment: Hand held shower head;Shower seat;Other (comment);Walker - 2 wheels Additional Comments: wears O2 at night    Prior Function Level of Independence: Independent               Hand Dominance        Extremity/Trunk Assessment   Upper Extremity Assessment: Overall WFL for tasks assessed           Lower Extremity Assessment: Overall WFL for tasks assessed      Cervical / Trunk Assessment: Normal  Communication   Communication: No difficulties  Cognition Arousal/Alertness: Awake/alert Behavior During Therapy: WFL for tasks assessed/performed Overall Cognitive Status: Within Functional Limits for tasks assessed                      General Comments      Exercises        Assessment/Plan    PT  Assessment Patent does not need any further PT services  PT Diagnosis Difficulty walking;Generalized weakness   PT Problem List    PT Treatment Interventions     PT Goals (Current goals can be found in the Care Plan section) Acute Rehab PT Goals Patient Stated Goal: to go home; pt and husband do not feel she needs HHPT PT Goal Formulation: With patient/family    Frequency     Barriers to discharge        Co-evaluation               End of Session Equipment Utilized During Treatment: Gait belt Activity Tolerance: Patient tolerated treatment well Patient left: in chair;with call bell/phone within reach;with  family/visitor present Nurse Communication: Mobility status    Functional Assessment Tool Used: clinical judgement Functional Limitation: Mobility: Walking and moving around Mobility: Walking and Moving Around Current Status [id number]): At least 1 percent but less than 20 percent impaired, limited or restricted Mobility: Walking and Moving Around Goal Status [phone]): At least 1 percent but less than 20 percent impaired, limited or restricted Mobility: Walking and Moving Around Discharge Status [phone]): At least 1 percent but less than 20 percent impaired, limited or restricted    Time: [id number] PT Time Calculation (min) (ACUTE ONLY): 16 min   Charges:   PT Evaluation $PT Eval Low Complexity: 1 Procedure     PT G Codes:   PT G-Codes **NOT FOR INPATIENT CLASS** Functional Assessment Tool Used: clinical judgement Functional Limitation: Mobility: Walking and moving around Mobility: Walking and Moving Around Current Status [id number]): At least 1 percent but less than 20 percent impaired, limited or restricted Mobility: Walking and Moving Around Goal Status [phone]): At least 1 percent but less than 20 percent impaired, limited or restricted Mobility: Walking and Moving Around Discharge Status [phone]): At least 1 percent but less than 20 percent impaired, limited or restricted    [facility] 03/03/2015, 9:56 AM

## 2015-03-03 NOTE — Telephone Encounter (Signed)
LVM for pt to return call

## 2015-03-04 NOTE — Telephone Encounter (Signed)
Spoke with pt, states that her DME company [employer] is out of network with her insurance, needs to switch to [facility] for insurance purposes.  Pt uses 2lpm 02 qhs only.   New order placed for pt.  Nothing further needed.

## 2015-03-05 DIAGNOSIS — R0602 Shortness of breath: Secondary | ICD-10-CM | POA: Diagnosis not present

## 2015-03-05 DIAGNOSIS — J9611 Chronic respiratory failure with hypoxia: Secondary | ICD-10-CM | POA: Diagnosis not present

## 2015-03-05 DIAGNOSIS — J8410 Pulmonary fibrosis, unspecified: Secondary | ICD-10-CM | POA: Diagnosis not present

## 2015-03-05 DIAGNOSIS — R531 Weakness: Secondary | ICD-10-CM | POA: Diagnosis not present

## 2015-03-13 ENCOUNTER — Other Ambulatory Visit: Payer: Self-pay

## 2015-03-13 DIAGNOSIS — I10 Essential (primary) hypertension: Secondary | ICD-10-CM

## 2015-03-13 NOTE — Patient Outreach (Signed)
[facility]) Care Management  03/13/2015  [name] 27-Aug-1945 [id number]  Referral Date:  02/28/15 Source:  HTA Tier 4 Issue:  ED and Hospital Admission utilization.  DM.   Providers: Primary MD: Dr. Carol Ada  -  last appt: 02/2015     next appt:  03/17/2015 Pulmonologist:  Brand Males, MD [facility]. Dr. Lauris Chroman -appt pending pulmonary (PFT) and walking test April 28, 2015.   OP Pulmonary Rehab:  Services to start next week 3/6/207 HH: None  Insurance:  [employer]  Social: Patient lives in the home with her husband.  Mobility: Ambulating independently but has a walker if needed.  Falls: 2  Saturday ED visit - hit head - CAT scan - neg.  Pain: none Transportation:  Husband Caregiver:  Husband, Yalexa Handelman DME: walker, home BP cuff, home oxygen (provider: [place] 2L/m via [place] at night).   THN conditions:  HTN Admissions:  4   03/01/2015 - 03/03/2015 Syncope secondary to orthostatic hypotension and illness  ER visits: 0 BP 119/88 sitting and 114/68 standing  States high utilization of services recently associated to illness / flu.    Medications:  Patient taking more than 10 medications  Co-pay cost issues: none  ($0 - 12 dollars) Flu Vaccine: 10/09/14 PCV13: 01/15/2014 PPSV23:  11/23/2009  Consent: Patient agreed to [facility] services.  Patient consents to discussing PHI with husband, Shaela Waguespack.   Plan:  [facility] Telephonic RN CM Referral  -follow care coordination of [facility] appt and Rehab services -follow-up on outcome and updates of Primary MD appt 3/6/23017   [place] Referral -taking more than 10 medications.     RN CM notified [place] Management Assistant: agreed to services/case opened. RN CM sent successful outreach letter and  [facility] Introductory package. RN CM scheduled next contact call within the next 30 days for initial assessment.  RN CM advised to please notify MD of any changes in  condition prior to scheduled appt's.   RN CM provided contact name and # [phone] or main office # [phone] and 24-hour nurse line # [phone].  RN CM confirmed patient is aware of 911 services for urgent emergency needs.  [name], RN, BSN, [facility], CCM  [facility] [employer] Management Coordinator [phone] Direct [phone] Cell [phone] Office [phone] Fax

## 2015-03-17 ENCOUNTER — Encounter (HOSPITAL_COMMUNITY): Payer: Self-pay

## 2015-03-17 ENCOUNTER — Encounter (HOSPITAL_COMMUNITY)
Admission: RE | Admit: 2015-03-17 | Discharge: 2015-03-17 | Disposition: A | Discharge status: Home / Self Care | Payer: PPO | Source: Ambulatory Visit | Attending: Internal Medicine | Admitting: Internal Medicine

## 2015-03-17 VITALS — BP 141/65 | HR 71 | Resp 18 | Ht 59.0 in | Wt 141.1 lb

## 2015-03-17 DIAGNOSIS — J849 Interstitial pulmonary disease, unspecified: Secondary | ICD-10-CM | POA: Diagnosis not present

## 2015-03-17 DIAGNOSIS — I951 Orthostatic hypotension: Secondary | ICD-10-CM | POA: Diagnosis not present

## 2015-03-17 DIAGNOSIS — E876 Hypokalemia: Secondary | ICD-10-CM | POA: Diagnosis not present

## 2015-03-17 HISTORY — DX: Chronic kidney disease, unspecified: N18.9

## 2015-03-17 NOTE — Progress Notes (Signed)
Pulmonary Individual Treatment Plan  Patient Details  Name: [name] MRN: [id number] Date of Birth: 1945/05/15 Referring Provider:  Carol Ada, MD  Initial Encounter Date:   Visit Diagnosis: ILD (interstitial lung disease) ([place])  Patient's Home Medications on Admission:   Current outpatient prescriptions:  .  ALPRAZolam (XANAX) 0.25 MG tablet, Take 0.25 mg by mouth at bedtime., Disp: , Rfl:  .  aspirin EC 81 MG tablet, Take 81 mg by mouth daily., Disp: , Rfl:  .  calcitRIOL (ROCALTROL) 0.5 MCG capsule, Take 0.5 mcg by mouth daily., Disp: , Rfl:  .  clopidogrel (PLAVIX) 75 MG tablet, Take 1 tablet (75 mg total) by mouth daily with breakfast., Disp: 30 tablet, Rfl: 10 .  diltiazem (MATZIM LA) 240 MG 24 hr tablet, Take 240 mg by mouth every morning. , Disp: , Rfl:  .  doxazosin (CARDURA) 2 MG tablet, Take 2 mg by mouth daily., Disp: , Rfl:  .  furosemide (LASIX) 20 MG tablet, Take 1 tablet (20 mg total) by mouth every other day., Disp: 15 tablet, Rfl: 6 .  isosorbide mononitrate (IMDUR) 60 MG 24 hr tablet, TAKE 1 AND 1/2 TABLETS BY MOUTH EVERY DAY (Patient taking differently: TAKE 1 AND 1/2 TABLETS= 90 mg BY MOUTH EVERY DAY), Disp: 45 tablet, Rfl: 10 .  levETIRAcetam (KEPPRA) 500 MG tablet, Take 1 tablet (500 mg total) by mouth 2 (two) times daily., Disp: 60 tablet, Rfl: 11 .  levothyroxine (SYNTHROID, LEVOTHROID) 100 MCG tablet, Take 100 mcg by mouth daily before breakfast., Disp: , Rfl:  .  loperamide (IMODIUM) 1 MG/5ML solution, Take 1 mg by mouth as needed for diarrhea or loose stools., Disp: , Rfl:  .  losartan (COZAAR) 100 MG tablet, Take 100 mg by mouth every morning. , Disp: , Rfl:  .  metoprolol succinate (TOPROL-XL) 50 MG 24 hr tablet, Take 50 mg by mouth daily. Take with or immediately following a meal., Disp: , Rfl:  .  nitroGLYCERIN (NITROSTAT) 0.4 MG SL tablet, Place 1 tablet (0.4 mg total) under the tongue every 5 (five) minutes as needed for chest pain., Disp: 25  tablet, Rfl: 5 .  ondansetron (ZOFRAN) 8 MG tablet, Take 4-8 mg by mouth every 6 (six) hours as needed for nausea or vomiting., Disp: , Rfl:  .  pantoprazole (PROTONIX) 40 MG tablet, TAKE 1 TABLET (40 MG TOTAL) BY MOUTH 2 (TWO) TIMES DAILY., Disp: 60 tablet, Rfl: 11 .  PARoxetine (PAXIL) 30 MG tablet, Take 30 mg by mouth daily., Disp: , Rfl:  .  Phenylephrine-DM-GG (MUCINEX FAST-MAX CONGEST COUGH) 2.5-5-100 MG/5ML LIQD, Take 20 mLs by mouth every 4 (four) hours as needed (for cough and congestion)., Disp: , Rfl:  .  potassium chloride SA (K-DUR,KLOR-CON) 20 MEQ tablet, Take 1 tablet (20 mEq total) by mouth every other day. Take with Lasix., Disp: 30 tablet, Rfl: 6 .  rosuvastatin (CRESTOR) 20 MG tablet, Take 1 tablet (20 mg total) by mouth every morning., Disp: 90 tablet, Rfl: 3 .  albuterol (PROVENTIL HFA;VENTOLIN HFA) 108 (90 Base) MCG/ACT inhaler, Inhale 2 puffs into the lungs every 6 (six) hours as needed for wheezing or shortness of breath. (Patient not taking: Reported on 03/01/2015), Disp: 1 Inhaler, Rfl: 2  Past Medical History: Past Medical History  Diagnosis Date  . Irritable bowel syndrome   . Hypertension   . Depression   . Hypothyroidism   . Internal hemorrhoids   . Anxiety   . Heart murmur   . Hyperlipidemia   .  Vitamin D deficiency   . Chronic low back pain   . Carpal tunnel syndrome   . History of non-ST elevation myocardial infarction (NSTEMI)     2011--  S/P PCI WITH SENTING  . S/P drug eluting coronary stent placement     X3  in 2011, 2012, 2014  . GERD (gastroesophageal reflux disease)   . History of hiatal hernia   . Partial seizure disorder ([facility]) NEUROLOGIST-- DR WILLIS    NOCTURNAL  . History of supraventricular tachycardia     2011-- resolved  . History of esophageal dilatation     FOR STRIUCTURE  . Gross hematuria   . Arthritis   . Cervical spondylosis without myelopathy   . S/P pericardial cyst excision     02-05-2013  benign  . Coronary artery disease  CARDIOLOGIST-  DR Irish Lack    hx NSTEMI inferoposterior 2011 s/p PCI  total x3 DES's  . History of gout   . PONV (postoperative nausea and vomiting)     hard time getting iv site-had to do neck stick 2 yrs ago  . Anginal pain ([employer])     over yr last time  . Shortness of breath dyspnea   . History of kidney stones   . Seizures ([place])     none in yrs  . Pulmonary fibrosis ([place])   . Chronic kidney disease     Tobacco Use: History  Smoking status  . Former Smoker -- 0.30 packs/day for 3 years  . Types: Cigarettes  . Start date: 11/08/1964  . Quit date: 11/09/1967  Smokeless tobacco  . Never Used    Labs:     Recent Review Flowsheet Data    Labs for ITP Cardiac and Pulmonary Rehab Latest Ref Rng 02/19/2014 10/15/2014 01/24/2015 01/24/2015 01/25/2015   Cholestrol 0 - 200 mg/dL 134 140 - - -   LDLCALC 0 - 99 mg/dL 55 59 - - -   HDL >40 mg/dL 59.60 67 - - -   Trlycerides <150 mg/dL 99.0 72 - - -   Hemoglobin A1c 4.8 - 5.6 % - 6.5(H) - - -   PHART 7.350 - 7.450 - - 7.358 7.276(L) 7.362   PCO2ART 35.0 - 45.0 mmHg - - 45.2(H) 56.7(H) 45.6(H)   HCO3 20.0 - 24.0 mEq/L - - 25.7(H) 26.6(H) 25.9(H)   TCO2 0 - 100 mmol/L - - 27 28 27    ACIDBASEDEF 0.0 - 2.0 mmol/L - - - 1.0 -   O2SAT - - - 100.0 93.0 99.0      Capillary Blood Glucose: Lab Results  Component Value Date   GLUCAP 102* 03/03/2015   GLUCAP 79 03/02/2015   GLUCAP 107* 03/01/2015   GLUCAP 110* 01/26/2015   GLUCAP 119* 01/26/2015     ADL UCSD:   Pulmonary Function Assessment:     Pulmonary Function Assessment - 03/17/15 1039    Breath   Bilateral Breath Sounds Clear   Shortness of Breath No      Exercise Target Goals:    Exercise Program Goal: Individual exercise prescription set with THRR, safety & activity barriers. Participant demonstrates ability to understand and report RPE using BORG scale, to self-measure pulse accurately, and to acknowledge the importance of the exercise prescription.  Exercise  Prescription Goal: Starting with aerobic activity 30 plus minutes a day, 3 days per week for initial exercise prescription. Provide home exercise prescription and guidelines that participant acknowledges understanding prior to discharge.  Activity Barriers & Risk Stratification:  Activity Barriers & Cardiac Risk Stratification - 03/17/15 1036    Activity Barriers & Cardiac Risk Stratification   Activity Barriers Deconditioning;Shortness of Breath;History of Falls;Balance Concerns;Back Problems  slipped and degenerative disc disease      6 Minute Walk:     6 Minute Walk      03/20/15 1607       6 Minute Walk   Phase Initial     Distance 708 feet     Walk Time 6 minutes     # of Rest Breaks 0     MPH 1.34     METS 1.82     RPE 15     Perceived Dyspnea  2     VO2 Peak 6.37     Symptoms No     Resting HR 82 bpm     Resting BP 128/64 mmHg     Max Ex. HR 96 bpm     Max Ex. BP 134/70 mmHg     Pre/Post BP   Baseline BP 128/64 mmHg     6 Minute BP 134/70 mmHg     2 Minute Post BP 132/70 mmHg     Pre/Post BP? Yes     Interval HR   Baseline HR 82     1 Minute HR 93     2 Minute HR 96     3 Minute HR 86     4 Minute HR 92     5 Minute HR 91     6 Minute HR 94     2 Minute Post HR 78     Interval Heart Rate? Yes     Interval Oxygen   Interval Oxygen? Yes     Baseline Oxygen Saturation % 96 %     Baseline Liters of Oxygen 0 L  Room Air     1 Minute Oxygen Saturation % 95 %     1 Minute Liters of Oxygen 0 L     2 Minute Oxygen Saturation % 96 %     2 Minute Liters of Oxygen 0 L     3 Minute Oxygen Saturation % 98 %     3 Minute Liters of Oxygen 0 L     4 Minute Oxygen Saturation % 98 %     4 Minute Liters of Oxygen 0 L     5 Minute Oxygen Saturation % 98 %     5 Minute Liters of Oxygen 0 L     6 Minute Oxygen Saturation % 99 %     6 Minute Liters of Oxygen 0 L     2 Minute Post Oxygen Saturation % 100 %     2 Minute Post Liters of Oxygen 0 L        Initial  Exercise Prescription:     Initial Exercise Prescription - 03/20/15 1600    Date of Initial Exercise Prescription   Date 03/20/15   Bike   Level 0.3   Minutes 15   METs 1.8   NuStep   Level 1   Minutes 15   METs 1.8   Track   Laps 6   Minutes 15   Prescription Details   Frequency (times per week) 2   Duration Progress to 45 minutes of aerobic exercise without signs/symptoms of physical distress   Intensity   THRR 40-80% of Max Heartrate 61-122   Ratings of Perceived Exertion 11-13   Perceived Dyspnea 0-4   Progression   Progression  Continue to progress workloads to maintain intensity without signs/symptoms of physical distress.   Resistance Training   Training Prescription Yes   Weight [place]   Reps 10-12      Perform Capillary Blood Glucose checks as needed.  Exercise Prescription Changes:   Discharge Exercise Prescription (Final Exercise Prescription Changes):    Nutrition:  Target Goals: Understanding of nutrition guidelines, daily intake of sodium 1500mg , cholesterol 200mg , calories 30% from fat and 7% or less from saturated fats, daily to have 5 or more servings of fruits and vegetables.  Biometrics:     Pre Biometrics - 03/17/15 1042    Pre Biometrics   Grip Strength 16 kg  left hand       Nutrition Therapy Plan and Nutrition Goals:   Nutrition Discharge: Rate Your Plate Scores:   Psychosocial: Target Goals: Acknowledge presence or absence of depression, maximize coping skills, provide positive support system. Participant is able to verbalize types and ability to use techniques and skills needed for reducing stress and depression.  Initial Review & Psychosocial Screening:     Initial Psych Review & Screening - 03/17/15 1058    Initial Review   Current issues with Current Depression;History of Depression;Current Sleep Concerns  She has trouble going to sleep    [place]? Yes   Barriers   Psychosocial  barriers to participate in program The patient should benefit from training in stress management and relaxation.   Screening Interventions   Interventions Encouraged to exercise      Quality of Life Scores:   PHQ-9:     Recent Review Flowsheet Data    Depression screen [facility] 2/9 03/17/2015 03/13/2015 08/09/2012 05/01/2012   Decreased Interest 1 0 0 3   Down, Depressed, Hopeless 1 0 0 0   PHQ - 2 Score 2 0 0 3   Altered sleeping 3 - 0 1   Tired, decreased energy 3 - 1 1   Change in appetite 0 - 0 0   Feeling bad or failure about yourself  1 - 0 0   Trouble concentrating 0 - 0 0   Moving slowly or fidgety/restless 0 - 0 0   Suicidal thoughts 0 - 0 0   PHQ-9 Score 9 - 1 5   Difficult doing work/chores Not difficult at all - - -      Psychosocial Evaluation and Intervention:     Psychosocial Evaluation - 03/17/15 1100    Psychosocial Evaluation & Interventions   Interventions Relaxation education;Encouraged to exercise with the program and follow exercise prescription      Psychosocial Re-Evaluation:  Education: Education Goals: Education classes will be provided on a weekly basis, covering required topics. Participant will state understanding/return demonstration of topics presented.  Learning Barriers/Preferences:     Learning Barriers/Preferences - 03/17/15 1038    Learning Barriers/Preferences   Learning Barriers None   Learning Preferences Audio;Verbal Instruction;Group Instruction;Individual Instruction      Education Topics: Risk Factor Reduction:  -Group instruction that is supported by a PowerPoint presentation. Instructor discusses the definition of a risk factor, different risk factors for pulmonary disease, and how the heart and lungs work together.     Nutrition for Pulmonary Patient:  -Group instruction provided by PowerPoint slides, verbal discussion, and written materials to support subject matter. The instructor gives an explanation and review of  healthy diet recommendations, which includes a discussion on weight management, recommendations for fruit and vegetable consumption, as well as protein,  fluid, caffeine, fiber, sodium, sugar, and alcohol. Tips for eating when patients are short of breath are discussed.   Pursed Lip Breathing:  -Group instruction that is supported by demonstration and informational handouts. Instructor discusses the benefits of pursed lip and diaphragmatic breathing and detailed demonstration on how to preform both.     Oxygen Safety:  -Group instruction provided by PowerPoint, verbal discussion, and written material to support subject matter. There is an overview of "What is Oxygen" and "Why do we need it".  Instructor also reviews how to create a safe environment for oxygen use, the importance of using oxygen as prescribed, and the risks of noncompliance. There is a brief discussion on traveling with oxygen and resources the patient may utilize.   Oxygen Equipment:  -Group instruction provided by [facility] Staff utilizing handouts, written materials, and equipment demonstrations.   Signs and Symptoms:  -Group instruction provided by written material and verbal discussion to support subject matter. Warning signs and symptoms of infection, stroke, and heart attack are reviewed and when to call the physician/911 reinforced. Tips for preventing the spread of infection discussed.   Advanced Directives:  -Group instruction provided by verbal instruction and written material to support subject matter. Instructor reviews Advanced Directive laws and proper instruction for filling out document.   Pulmonary Video:  -Group video education that reviews the importance of medication and oxygen compliance, exercise, good nutrition, pulmonary hygiene, and pursed lip and diaphragmatic breathing for the pulmonary patient.   Exercise for the Pulmonary Patient:  -Group instruction that is supported by a PowerPoint  presentation. Instructor discusses benefits of exercise, core components of exercise, frequency, duration, and intensity of an exercise routine, importance of utilizing pulse oximetry during exercise, safety while exercising, and options of places to exercise outside of rehab.     Pulmonary Medications:  -Verbally interactive group education provided by instructor with focus on inhaled medications and proper administration.   Anatomy and Physiology of the Respiratory System and Intimacy:  -Group instruction provided by PowerPoint, verbal discussion, and written material to support subject matter. Instructor reviews respiratory cycle and anatomical components of the respiratory system and their functions. Instructor also reviews differences in obstructive and restrictive respiratory diseases with examples of each. Intimacy, Sex, and Sexuality differences are reviewed with a discussion on how relationships can change when diagnosed with pulmonary disease. Common sexual concerns are reviewed.   Knowledge Questionnaire Score:   Personal Goals and Risk Factors at Admission:     Personal Goals and Risk Factors at Admission - 03/17/15 1043    Core Components/Risk Factors/Patient Goals on Admission    Weight Management Yes   Intervention Weight Management: Develop a combined nutrition and exercise program designed to reach desired caloric intake, while maintaining appropriate intake of nutrient and fiber, sodium and fats, and appropriate energy expenditure required for the weight goal.;Weight Management: Provide education and appropriate resources to help participant work on and attain dietary goals.   Admit Weight 141 lb 1.5 oz (64 kg)   Expected Outcomes Short Term: Continue to assess and modify interventions until short term weight is achieved.;Long Term: Adherence to nutrition and physical activity/exercise program aimed toward attainment of established weight goal.   Sedentary Yes   Intervention  Provide advice, education, support and counseling about physical activity/exercise needs.;Develop an individualized exercise prescription for aerobic and resistive training based on initial evaluation findings, risk stratification, comorbidities and participant's personal goals.   Expected Outcomes Achievement of increased cardiorespiratory fitness and enhanced flexibility, muscular  endurance and strength shown through measurements of functional capaciy and personal statement of participant.   Improve shortness of breath with ADL's Yes   Intervention Provide education, individualized exercise plan and daily activity instruction to help decrease symptoms of SOB with activities of daily living.   Expected Outcomes Short Term: Achieves a reduction of symptoms when performing activities of daily living.   Develop more efficient breathing techniques such as purse lipped breathing and diaphragmatic breathing; and practicing self-pacing with activity Yes   Intervention Provide education, demonstration and support about specific breathing techniuqes utilized for more efficient breathing. Include techniques such as pursed lipped breathing, diaphragmatic breathing and self-pacing activity.   Expected Outcomes Short Term: Participant will be able to demonstrate and use breathing techniques as needed throughout daily activities.   Increase knowledge of respiratory medications and ability to use respiratory devices properly  Yes   Intervention Provide education and demonstration as needed of appropriate use of medications, inhalers, and oxygen therapy.   Expected Outcomes Short Term: Achieves understanding of medications use. Understands that oxygen is a medication prescribed by physician. Demonstrates appropriate use of inhaler and oxygen therapy.   Personal Goal Other Yes   Personal Goal To learn more about ILD, and to increase her strength and stamina   Intervention Provide educational materials for ILD, increase  exercise workloads as tolerated.   Expected Outcomes She will have an increased knowledge of ILD and and increase in her strength and stamina.      Personal Goals and Risk Factors Review:    Personal Goals Discharge (Final Personal Goals and Risk Factors Review):    ITP Comments:   Comments: Jeanny EKKO BROOMHEAD 70 y.o. female Pulmonary Rehab Orientation Note [phone] Patient arrived today in Cardiac and Pulmonary Rehab for orientation to Pulmonary Rehab. She ambulated from [place] parking with little difficulty, accompanied by her husband. She has not been prescribed oxygen for home use. Color good, skin warm and dry. Patient is oriented to time and place. Patient's medical history, psychosocial health, and medications reviewed. Psychosocial assessment reveals pt lives with their spouse. They have no children. They enjoy their dog of 12 years, traveling, and attending gospel music sings. Ceria also enjoys working puzzles. Pt reports minimal stress in her life.Pt does exhibits signs of depression such as sleeplessness. She is currently on medication for depression and anxiety. She states her anxiety is displayed as "my mind will not shut off at night and I have a difficult time falling asleep even with xanax". Pt shows good  coping skills with positive outlook . She was  offered emotional support and reassurance. Physical assessment reveals heart rate is normal, breath sounds clear to auscultation, no wheezes, rales, or rhonchi. Grip strength equal, strong. Distal pulses palpable. Patient reports she does take medications as prescribed. Patient states she follows a Regular diet. The patient reports no specific efforts to gain or lose weight. She has lost approximately 10 lbs since her most recent hospitalization a month ago. Patient's weight will be monitored closely. Demonstration and practice of PLB using pulse oximeter. Patient able to return demonstration satisfactorily. Safety and hand hygiene in the  exercise area reviewed with patient. Patient voices understanding of the information reviewed. Department expectations discussed with patient and achievable goals were set. The patient shows enthusiasm about attending the program and we look forward to working with this nice lady. The patient is scheduled for a 6 min walk test on Thursday 03/20/15 and to begin exercise on Tuesday 03/25/15 in  the 1030 class.

## 2015-03-20 ENCOUNTER — Encounter (HOSPITAL_COMMUNITY)
Admission: RE | Admit: 2015-03-20 | Discharge: 2015-03-20 | Disposition: A | Discharge status: Home / Self Care | Payer: PPO | Source: Ambulatory Visit | Attending: Internal Medicine | Admitting: Internal Medicine

## 2015-03-20 DIAGNOSIS — J849 Interstitial pulmonary disease, unspecified: Secondary | ICD-10-CM | POA: Diagnosis not present

## 2015-03-25 ENCOUNTER — Encounter (HOSPITAL_COMMUNITY)
Admission: RE | Admit: 2015-03-25 | Discharge: 2015-03-25 | Disposition: A | Discharge status: Home / Self Care | Payer: PPO | Source: Ambulatory Visit | Attending: Internal Medicine | Admitting: Internal Medicine

## 2015-03-25 DIAGNOSIS — J849 Interstitial pulmonary disease, unspecified: Secondary | ICD-10-CM | POA: Diagnosis not present

## 2015-03-25 NOTE — Progress Notes (Signed)
Daily Session Note  Patient Details  Name: [name] MRN: [id number] Date of Birth: 1945/05/27 Referring Provider:  Carol Ada, MD  Encounter Date: 03/25/2015  Check In:     Session Check In - 03/25/15 1021    Check-In   Location [facility]   Staff Present Alberteen Sam, MA, ACSM RCEP, Exercise Physiologist;Chevonne Bostrom Rollene Rotunda, RN, Maxcine Ham, RN, BSN;Ramon Dredge, RN, MHA   Physician(s) Dr. Marily Memos   Medication changes reported     No   Fall or balance concerns reported    No   Warm-up and Cool-down Performed as group-led instruction   Resistance Training Performed Yes   VAD Patient? No   Pain Assessment   Currently in Pain? No/denies      Capillary Blood Glucose: No results found for this or any previous visit (from the past 24 hour(s)).      Exercise Prescription Changes - 03/25/15 1300    Exercise Review   Progression Yes   Response to Exercise   Blood Pressure (Admit) 92/50 mmHg  recheck post hydration 123/62   Blood Pressure (Exercise) 134/66 mmHg   Blood Pressure (Exit) 108/62 mmHg   Heart Rate (Admit) 87 bpm   Heart Rate (Exercise) 112 bpm   Heart Rate (Exit) 84 bpm   Oxygen Saturation (Admit) 95 %   Oxygen Saturation (Exercise) 98 %   Oxygen Saturation (Exit) 96 %   Rating of Perceived Exertion (Exercise) 13   Perceived Dyspnea (Exercise) 2   Symptoms none   Duration Progress to 45 minutes of aerobic exercise without signs/symptoms of physical distress   Intensity Other (comment)  THR 40-80% HRR   Progression   Progression Continue to progress workloads to maintain intensity without signs/symptoms of physical distress.   Resistance Training   Training Prescription Yes   Weight Orange bands   Reps 10-12   Bike   Level 1  scifit   Minutes 15   NuStep   Level 2   Minutes 15   METs 1.9   Track   Laps 10   Minutes 15     Goals Met:  Exercise tolerated well Queuing for purse lip breathing No report of  cardiac concerns or symptoms Strength training completed today  Goals Unmet:  Not Applicable  Comments: Service time is from 1030 to 1230   Dr. Rush Farmer is Medical Director for Pulmonary Rehab at [facility].

## 2015-03-27 ENCOUNTER — Ambulatory Visit: Payer: Self-pay

## 2015-03-27 ENCOUNTER — Encounter (HOSPITAL_COMMUNITY)
Admission: RE | Admit: 2015-03-27 | Discharge: 2015-03-27 | Disposition: A | Discharge status: Home / Self Care | Payer: PPO | Source: Ambulatory Visit | Attending: Internal Medicine | Admitting: Internal Medicine

## 2015-03-27 DIAGNOSIS — J849 Interstitial pulmonary disease, unspecified: Secondary | ICD-10-CM | POA: Diagnosis not present

## 2015-03-27 NOTE — Progress Notes (Signed)
Daily Session Note  Patient Details  Name: [name] MRN: [id number] Date of Birth: 1945-09-28 Referring Provider:  Carol Ada, MD  Encounter Date: 03/27/2015  Check In:     Session Check In - 03/27/15 1040    Check-In   Location [facility]   Staff Present Rosebud Poles, RN, BSN;Carlette Wilber Oliphant, RN, Levie Heritage, MA, ACSM RCEP, Exercise Physiologist;Andreah Goheen Ysidro Evert, Felipe Drone, RN, MHA;Portia Rollene Rotunda, RN, BSN   Supervising physician immediately available to respond to emergencies [facility] immediately available   Physician(s) Dr. Jerilee Hoh   Medication changes reported     No   Fall or balance concerns reported    No   Warm-up and Cool-down Performed as group-led instruction   Resistance Training Performed Yes   VAD Patient? No   Pain Assessment   Currently in Pain? No/denies   Multiple Pain Sites No      Capillary Blood Glucose: No results found for this or any previous visit (from the past 24 hour(s)).      Exercise Prescription Changes - 03/27/15 1200    Response to Exercise   Blood Pressure (Admit) 90/52 mmHg   Blood Pressure (Exercise) 106/60 mmHg   Blood Pressure (Exit) 104/73 mmHg   Heart Rate (Admit) 83 bpm   Heart Rate (Exercise) 90 bpm   Heart Rate (Exit) 82 bpm   Oxygen Saturation (Admit) 99 %   Oxygen Saturation (Exercise) 98 %   Oxygen Saturation (Exit) 97 %   Rating of Perceived Exertion (Exercise) 17   Perceived Dyspnea (Exercise) 2   Symptoms none   Duration Progress to 45 minutes of aerobic exercise without signs/symptoms of physical distress   Intensity THRR unchanged   Progression   Progression Continue to progress workloads to maintain intensity without signs/symptoms of physical distress.   Resistance Training   Training Prescription Yes   Weight orange bands   Reps 10-12   Bike   Level 1   Minutes 15   NuStep   Level 2   Minutes 15   METs 2     Goals Met:  Queuing for purse lip  breathing No report of cardiac concerns or symptoms Strength training completed today  Goals Unmet:  RPE, sicfit harder than normal today.  Comments: Service time is from 1030 to 1240    Dr. Rush Farmer is Medical Director for Pulmonary Rehab at [facility].

## 2015-03-28 ENCOUNTER — Other Ambulatory Visit: Payer: Self-pay

## 2015-03-28 NOTE — Patient Outreach (Signed)
[facility]) Care Management  03/28/2015  [name] 1945-04-28 [id number]  Outreach call #1 to patient for telephonic initial assessment.  Patient not reached.  RN CM left HIPAA compliant voice message with name and number for call back.  RN CM scheduled for next outreach call within one week.   [name], RN, BSN, [facility], CCM  [facility] [employer] Management Coordinator [phone] Direct [phone] Cell [phone] Office [phone] Fax

## 2015-04-01 ENCOUNTER — Ambulatory Visit: Payer: Self-pay

## 2015-04-01 ENCOUNTER — Encounter (HOSPITAL_COMMUNITY)
Admission: RE | Admit: 2015-04-01 | Discharge: 2015-04-01 | Disposition: A | Discharge status: Home / Self Care | Payer: PPO | Source: Ambulatory Visit | Attending: Internal Medicine | Admitting: Internal Medicine

## 2015-04-01 DIAGNOSIS — J849 Interstitial pulmonary disease, unspecified: Secondary | ICD-10-CM | POA: Diagnosis not present

## 2015-04-01 NOTE — Progress Notes (Signed)
Daily Session Note  Patient Details  Name: [name] MRN: [id number] Date of Birth: 27-Dec-1945 Referring Provider:  Carol Ada, MD  Encounter Date: 04/01/2015  Check In:     Session Check In - 04/01/15 1219    Check-In   Location [facility]   Staff Present Alberteen Sam, MA, ACSM RCEP, Exercise Physiologist;Portia Rollene Rotunda, RN, BSN;Ramon Dredge, RN, MHA;Joan Leonia Reeves, RN, BSN   Supervising physician immediately available to respond to emergencies [facility] immediately available   Physician(s) Dr. Marily Memos   Medication changes reported     No   Fall or balance concerns reported    No   Warm-up and Cool-down Performed as group-led instruction   Resistance Training Performed Yes   VAD Patient? No   Pain Assessment   Currently in Pain? No/denies   Multiple Pain Sites No      Capillary Blood Glucose: No results found for this or any previous visit (from the past 24 hour(s)).      Exercise Prescription Changes - 04/01/15 1100    Exercise Review   Progression Yes   Response to Exercise   Blood Pressure (Admit) 120/64 mmHg   Blood Pressure (Exercise) 120/62 mmHg   Blood Pressure (Exit) 102/56 mmHg   Heart Rate (Admit) 85 bpm   Heart Rate (Exercise) 107 bpm   Heart Rate (Exit) 82 bpm   Oxygen Saturation (Admit) 95 %   Oxygen Saturation (Exercise) 97 %   Oxygen Saturation (Exit) 98 %   Rating of Perceived Exertion (Exercise) 13   Perceived Dyspnea (Exercise) 2   Symptoms none   Comments Reviewed home exercise on 04/01/15   Frequency Add 1 additional day to program exercise sessions.   Duration Progress to 45 minutes of aerobic exercise without signs/symptoms of physical distress   Intensity THRR unchanged   Progression   Progression Continue to progress workloads to maintain intensity without signs/symptoms of physical distress.   Resistance Training   Training Prescription Yes   Weight orange bands   Reps 10-12   Bike   Level 1    Minutes 15   NuStep   Level 3   Minutes 15   METs 2   Track   Laps 9   Minutes 15   Home Exercise Plan   Plans to continue exercise at Home     Goals Met:  Exercise tolerated well Personal goals reviewed  Goals Unmet:  Not Applicable  Comments: I have reviewed a Home Exercise Prescription with [name] . [name] is not  currently exercising at home.  The patient was advised to walk 2-3 days a week for 30-45 minutes.  [name] and I discussed how to progress her exercise prescription.  The patient stated that her goals were to be able to do house work again.  The patient stated that they understand the exercise prescription.  We reviewed exercise guidelines, target heart rate during exercise, oxygen use, weather, home pulse oximeter, endpoints for exercise, and goals.  Patient is encouraged to come to me with any questions. I will continue to follow up with the patient to assist them with progression and safety.  [id number] Service time is from 1030 to 1215    Dr. Rush Farmer is Medical Director for Pulmonary Rehab at [facility].

## 2015-04-02 ENCOUNTER — Other Ambulatory Visit: Payer: Self-pay

## 2015-04-02 DIAGNOSIS — J8410 Pulmonary fibrosis, unspecified: Secondary | ICD-10-CM | POA: Diagnosis not present

## 2015-04-02 DIAGNOSIS — J9611 Chronic respiratory failure with hypoxia: Secondary | ICD-10-CM | POA: Diagnosis not present

## 2015-04-02 DIAGNOSIS — R0602 Shortness of breath: Secondary | ICD-10-CM | POA: Diagnosis not present

## 2015-04-02 DIAGNOSIS — R531 Weakness: Secondary | ICD-10-CM | POA: Diagnosis not present

## 2015-04-02 NOTE — Patient Outreach (Addendum)
[facility]) Care Management  [place]  04/02/2015   LOWANDA GILNER Jul 08, 1945 [id number]  Initial Assessment  Subjective:  Providers: Primary MD: Dr. Carol Ada - last appt:  03/17/2015  Next appt 09/2015 Pulmonologist: Dr. Brand Males [facility]. Dr. Lauris Chroman -appt pending pulmonary (PFT) and walking test April 28, 2015.  OP Pulmonary Rehab:initiated 3/6/207 [facility] [place]) HH: None  Insurance: [employer]  Social: Patient lives in the home with her husband.  Mobility: Ambulating independently but has a walker if needed.  Currently does not require use of walker.  Falls: 2 Last fall date 03/01/15 requiring ED visit due to hitting head - CAT scan - neg.  Pain: none Transportation: Husband Caregiver: Husband, Milisa Luu Consent:  Patient agreed to [facility] services. Patient consents to discussing PHI with husband, Stephina Bernsen.  DME: walker, home BP cuff, home oxygen (provider: [facility] 2 L/m via [place] at night).   THN conditions: HTN, Lung Disease ER visits: 0 / Admissions: 4 last admission 03/01/2015 - 03/03/2015 Syncope secondary to orthostatic hypotension and illness.  BP 120/72.   States BP is monitored by Pulmonary Rehab services on Tuesdays and Thursdays and has remained normal.    Medications:  Patient taking more than 10 medications  Co-pay cost issues: none ($0 - 12 dollars) Flu Vaccine: 10/09/14 PCV13: 01/15/2014 PPSV23: 11/23/2009  Objective:   Current Medications:  Current Outpatient Prescriptions  Medication Sig Dispense Refill  . albuterol (PROVENTIL HFA;VENTOLIN HFA) 108 (90 Base) MCG/ACT inhaler Inhale 2 puffs into the lungs every 6 (six) hours as needed for wheezing or shortness of breath. 1 Inhaler 2  . ALPRAZolam (XANAX) 0.25 MG tablet Take 0.25 mg by mouth at bedtime.    Marland Kitchen aspirin EC 81 MG tablet Take 81 mg by mouth daily.    . calcitRIOL (ROCALTROL) 0.5 MCG capsule  Take 0.5 mcg by mouth daily.    . clopidogrel (PLAVIX) 75 MG tablet Take 1 tablet (75 mg total) by mouth daily with breakfast. 30 tablet 10  . diltiazem (MATZIM LA) 240 MG 24 hr tablet Take 240 mg by mouth every morning.     Marland Kitchen doxazosin (CARDURA) 2 MG tablet Take 2 mg by mouth daily.    . furosemide (LASIX) 20 MG tablet Take 1 tablet (20 mg total) by mouth every other day. 15 tablet 6  . isosorbide mononitrate (IMDUR) 60 MG 24 hr tablet TAKE 1 AND 1/2 TABLETS BY MOUTH EVERY DAY (Patient taking differently: TAKE 1 AND 1/2 TABLETS= 90 mg BY MOUTH EVERY DAY) 45 tablet 10  . levETIRAcetam (KEPPRA) 500 MG tablet Take 1 tablet (500 mg total) by mouth 2 (two) times daily. 60 tablet 11  . levothyroxine (SYNTHROID, LEVOTHROID) 100 MCG tablet Take 100 mcg by mouth daily before breakfast.    . loperamide (IMODIUM) 1 MG/5ML solution Take 1 mg by mouth as needed for diarrhea or loose stools.    Marland Kitchen losartan (COZAAR) 100 MG tablet Take 100 mg by mouth every morning.     . metoprolol succinate (TOPROL-XL) 50 MG 24 hr tablet Take 50 mg by mouth daily. Take with or immediately following a meal.    . nitroGLYCERIN (NITROSTAT) 0.4 MG SL tablet Place 1 tablet (0.4 mg total) under the tongue every 5 (five) minutes as needed for chest pain. 25 tablet 5  . ondansetron (ZOFRAN) 8 MG tablet Take 4-8 mg by mouth every 6 (six) hours as needed for nausea or vomiting.    Marland Kitchen  pantoprazole (PROTONIX) 40 MG tablet TAKE 1 TABLET (40 MG TOTAL) BY MOUTH 2 (TWO) TIMES DAILY. 60 tablet 11  . PARoxetine (PAXIL) 30 MG tablet Take 30 mg by mouth daily.    Marland Kitchen Phenylephrine-DM-GG (MUCINEX FAST-MAX CONGEST COUGH) 2.5-5-100 MG/5ML LIQD Take 20 mLs by mouth every 4 (four) hours as needed (for cough and congestion).    . potassium chloride SA (K-DUR,KLOR-CON) 20 MEQ tablet Take 1 tablet (20 mEq total) by mouth every other day. Take with Lasix. 30 tablet 6  . rosuvastatin (CRESTOR) 20 MG tablet Take 1 tablet (20 mg total) by mouth every morning. 90  tablet 3   No current facility-administered medications for this visit.    Functional Status:  In your present state of health, do you have any difficulty performing the following activities: 03/13/2015 03/01/2015  Hearing? N N  Vision? N N  Difficulty concentrating or making decisions? N N  Walking or climbing stairs? N N  Dressing or bathing? N N  Doing errands, shopping? N N  Preparing Food and eating ? N -  Using the Toilet? N -  In the past six months, have you accidently leaked urine? N -  Do you have problems with loss of bowel control? N -  Managing your Medications? N -  Managing your Finances? N -  Housekeeping or managing your Housekeeping? N -    Fall/Depression Screening: PHQ 2/9 Scores 04/02/2015 03/17/2015 03/13/2015 08/09/2012 05/01/2012  PHQ - 2 Score 0 2 0 0 3  PHQ- 9 Score 2 9 - 1 5     Fall Risk  04/02/2015 03/17/2015 03/13/2015  Falls in the past year? Yes Yes Yes  Number falls in past yr: 2 or more 2 or more 2 or more  Injury with Fall? No No Yes  Risk Factor Category  High Fall Risk High Fall Risk High Fall Risk  Risk for fall due to : History of fall(s);Other (Comment) Other (Comment);Medication side effect History of fall(s);Impaired balance/gait  Risk for fall due to (comments): H/o orthostatic Hypotension orthostatic hypotension -  Follow up Falls evaluation completed;Education provided;Falls prevention discussed Education provided Falls evaluation completed;Education provided;Falls prevention discussed    Assessment: Patient completed follow-up with Primary MD 03/17/2015.  No changes in medications reported.  Patient has initiated Pulmonary Rehab since last visit and reports good progress.  Patient has improved spirits and mood since recovery progress made since last contact call on 03/13/2015.  No new falls.  No new admissions over the past 30 days.   Plan:  Referral Date:  02/28/15 Source: HTA Tier 4 Issue: ED and Hospital Admission utilization. DM.   Telephonic RN CM services:   03/13/15 Program: HTN 03/13/15  Pulmonary and HTN Disease Management:  Emmi Education mailed 04/02/2015 -Diffuse Interstitial Lung Disease -Pulmonary Hypertension  -Low-Salt Diet -Know Before You Go - Your guide for where to go when you need medical care.   RN CM will continue to follow adherence to referral to [facility]. RN CM will continue to follow adherence to OP Pulmonary Rehab.  RN CM sent Primary MD Barriers letter and Initial Assessment.  RN CM scheduled next contact call within the next 30 days for telephonic assessment.  RN CM advised to please notify MD of any changes in condition prior to scheduled appt's.  RN CM provided contact name and # [phone] or main office # [phone] and 24-hour nurse line # [phone].  RN CM confirmed patient is aware of 911 services for  urgent emergency needs.  THN CM Care Plan Problem One        Most Recent Value   Care Plan Problem One  High Admission utilization over the past 6 months.    Role Documenting the Problem One  Care Management Telephonic Coordinator   Care Plan for Problem One  Active   THN Long Term Goal (31-90 days)  Patient will not require any hospital admissions / ED visits to manage her condition over the next 31-90 days.    THN Long Term Goal Start Date  04/02/15   Interventions for Problem One Long Term Goal  RN CM will provide education on reporting any changes in her condition to MD and seek appropriate follow-up to avoid unnecessary hosptiatl / ED utilization over the next 31-90 days.    THN CM Short Term Goal #1 (0-30 days)  Patient will review educational mailer:  "[place]" to improve awareness of  seeking MD care to avoid use of ED/admission services over the next 30 days.    THN CM Short Term Goal #1 Start Date  04/02/15   Interventions for Short Term Goal #1  RN CM will mail educational mailer:  "KNOW BEFORE YOU GO" within the next 30 days.      [facility] CM Care Plan Problem Two        Most Recent Value   Care Plan Problem Two  Pulmonary rehab needed.    Role Documenting the Problem Two  Care Management Telephonic Coordinator   Care Plan for Problem Two  Active   THN CM Short Term Goal #1 (0-30 days)  Patient will remain adherent to pulmonary rehab services over the next 30 days.    THN CM Short Term Goal #1 Start Date  04/02/15   Interventions for Short Term Goal #2   RN CM will provide education on the importance of rehab and send Emmi educational materials.     [facility] CM Care Plan Problem Three        Most Recent Value   Care Plan Problem Three  Specialty Referral:  [facility]   Role Documenting the Problem Three  Care Management Telephonic Coordinator   Care Plan for Problem Three  Active   THN CM Short Term Goal #1 (0-30 days)  Patient will remain adherent to appt/referral for 04/28/2015.    THN CM Short Term Goal #1 Start Date  04/02/15   Interventions for Short Term Goal #1  RN CM will provide Emmi Education on Pulmonary / Lung disease and encourage appt compliance over the next 30 days.      [name], RN, BSN, [facility], CCM  [facility] [employer] Management Coordinator [phone] Direct [phone] Cell [phone] Office [phone] Fax

## 2015-04-03 ENCOUNTER — Encounter (HOSPITAL_COMMUNITY)
Admission: RE | Admit: 2015-04-03 | Discharge: 2015-04-03 | Disposition: A | Discharge status: Home / Self Care | Payer: PPO | Source: Ambulatory Visit | Attending: Internal Medicine | Admitting: Internal Medicine

## 2015-04-03 VITALS — Wt 144.4 lb

## 2015-04-03 DIAGNOSIS — J849 Interstitial pulmonary disease, unspecified: Secondary | ICD-10-CM

## 2015-04-03 NOTE — Progress Notes (Signed)
Daily Session Note  Patient Details  Name: [name] MRN: [id number] Date of Birth: [birth year] Referring Provider:  Carol Ada, MD  Encounter Date: 04/03/2015  Check In:     Session Check In - 04/03/15 1103    Check-In   Location [facility]   Staff Present Rosebud Poles, RN, Luisa Hart, RN, BSN;Ramon Dredge, RN, MHA;Jessica Luan Pulling, MA, ACSM RCEP, Exercise Physiologist;Maria Venetia Maxon, RN, BSN   Supervising physician immediately available to respond to emergencies [facility] immediately available   Physician(s) Dr. Waldron Labs   Medication changes reported     No   Fall or balance concerns reported    No   Warm-up and Cool-down Performed as group-led instruction   Resistance Training Performed Yes   VAD Patient? No   Pain Assessment   Currently in Pain? No/denies   Multiple Pain Sites No      Capillary Blood Glucose: No results found for this or any previous visit (from the past 24 hour(s)).      Exercise Prescription Changes - 04/03/15 1200    Exercise Review   Progression No   Response to Exercise   Blood Pressure (Admit) 100/60 mmHg   Blood Pressure (Exercise) 100/60 mmHg   Blood Pressure (Exit) 104/62 mmHg   Heart Rate (Admit) 88 bpm   Heart Rate (Exercise) 101 bpm   Heart Rate (Exit) 80 bpm   Oxygen Saturation (Admit) 97 %   Oxygen Saturation (Exercise) 98 %   Oxygen Saturation (Exit) 97 %   Rating of Perceived Exertion (Exercise) 13   Perceived Dyspnea (Exercise) 2   Duration Progress to 45 minutes of aerobic exercise without signs/symptoms of physical distress   Intensity THRR unchanged   Progression   Progression Continue to progress workloads to maintain intensity without signs/symptoms of physical distress.   Resistance Training   Training Prescription Yes   Weight orange bands   Reps 10-12   NuStep   Level 3   Minutes 15   METs 2.2   Track   Laps 10   Minutes 15     Goals Met:  Independence with  exercise equipment Using PLB without cueing & demonstrates good technique Exercise tolerated well No report of cardiac concerns or symptoms Strength training completed today  Goals Unmet:  Not Applicable  Comments: Service time is from 1030 to 1220. She attended Meditation and mindfulness class today with Briant Sites    Dr. Rush Farmer is Medical Director for Pulmonary Rehab at [facility]

## 2015-04-04 ENCOUNTER — Other Ambulatory Visit: Payer: Self-pay | Admitting: Pharmacist

## 2015-04-04 NOTE — Patient Outreach (Signed)
[facility]) Care Management  [place]   04/04/2015  [name] 1945-03-18 [id number]  Subjective: [name] is a 70yo who was referred to [facility] for medication review.    Objective:   Current Medications: Current Outpatient Prescriptions  Medication Sig Dispense Refill  . albuterol (PROVENTIL HFA;VENTOLIN HFA) 108 (90 Base) MCG/ACT inhaler Inhale 2 puffs into the lungs every 6 (six) hours as needed for wheezing or shortness of breath. 1 Inhaler 2  . ALPRAZolam (XANAX) 0.25 MG tablet Take 0.25 mg by mouth at bedtime.    Marland Kitchen aspirin EC 81 MG tablet Take 81 mg by mouth daily.    . calcitRIOL (ROCALTROL) 0.5 MCG capsule Take 0.5 mcg by mouth daily.    . clopidogrel (PLAVIX) 75 MG tablet Take 1 tablet (75 mg total) by mouth daily with breakfast. 30 tablet 10  . diltiazem (MATZIM LA) 240 MG 24 hr tablet Take 240 mg by mouth every morning.     Marland Kitchen doxazosin (CARDURA) 2 MG tablet Take 2 mg by mouth daily.    . furosemide (LASIX) 20 MG tablet Take 1 tablet (20 mg total) by mouth every other day. 15 tablet 6  . isosorbide mononitrate (IMDUR) 60 MG 24 hr tablet TAKE 1 AND 1/2 TABLETS BY MOUTH EVERY DAY (Patient taking differently: TAKE 1 AND 1/2 TABLETS= 90 mg BY MOUTH EVERY DAY) 45 tablet 10  . levETIRAcetam (KEPPRA) 500 MG tablet Take 1 tablet (500 mg total) by mouth 2 (two) times daily. 60 tablet 11  . levothyroxine (SYNTHROID, LEVOTHROID) 100 MCG tablet Take 100 mcg by mouth daily before breakfast.    . loperamide (IMODIUM) 1 MG/5ML solution Take 1 mg by mouth as needed for diarrhea or loose stools.    Marland Kitchen losartan (COZAAR) 100 MG tablet Take 100 mg by mouth every morning.     . metoprolol succinate (TOPROL-XL) 50 MG 24 hr tablet Take 50 mg by mouth daily. Take with or immediately following a meal.    . nitroGLYCERIN (NITROSTAT) 0.4 MG SL tablet Place 1 tablet (0.4 mg total) under the tongue every 5 (five) minutes as needed for chest pain. 25 tablet 5  .  ondansetron (ZOFRAN) 8 MG tablet Take 4-8 mg by mouth every 6 (six) hours as needed for nausea or vomiting.    . pantoprazole (PROTONIX) 40 MG tablet TAKE 1 TABLET (40 MG TOTAL) BY MOUTH 2 (TWO) TIMES DAILY. 60 tablet 11  . PARoxetine (PAXIL) 30 MG tablet Take 30 mg by mouth daily.    Marland Kitchen Phenylephrine-DM-GG (MUCINEX FAST-MAX CONGEST COUGH) 2.5-5-100 MG/5ML LIQD Take 20 mLs by mouth every 4 (four) hours as needed (for cough and congestion).    . potassium chloride SA (K-DUR,KLOR-CON) 20 MEQ tablet Take 1 tablet (20 mEq total) by mouth every other day. Take with Lasix. 30 tablet 6  . rosuvastatin (CRESTOR) 20 MG tablet Take 1 tablet (20 mg total) by mouth every morning. 90 tablet 3   No current facility-administered medications for this visit.    Functional Status: In your present state of health, do you have any difficulty performing the following activities: 03/13/2015 03/01/2015  Hearing? N N  Vision? N N  Difficulty concentrating or making decisions? N N  Walking or climbing stairs? N N  Dressing or bathing? N N  Doing errands, shopping? N N  Preparing Food and eating ? N -  Using the Toilet? N -  In the past six months, have you accidently leaked  urine? N -  Do you have problems with loss of bowel control? N -  Managing your Medications? N -  Managing your Finances? N -  Housekeeping or managing your Housekeeping? N -    Fall/Depression Screening: PHQ 2/9 Scores 04/02/2015 03/17/2015 03/13/2015 08/09/2012 05/01/2012  PHQ - 2 Score 0 2 0 0 3  PHQ- 9 Score 2 9 - 1 5    Assessment:  Drugs sorted by system:  Neurologic/Psychologic: alprazolam, levetiracetam, paroxetine  Cardiovascular: aspirin, clopidogrel, diltiazem, doxazosin, furosemide, isosorbide mononitrate, losartan, metoprolol succinate, nitroglycerin SL, potassium, rosuvastatin  Pulmonary/Allergy: albuterol HFA, phenylephrine-DM-GG  Gastrointestinal: loperamide, ondansetron, pantoprazole  Endocrine: levothyroxine    Miscellaneous: calcitriol   Duplications in therapy: dual antiplatelet therapy (aspirin and clopidogrel) Gaps in therapy: none noted Medications to avoid in the elderly:  - pantoprazole (risk of Clostridium difficile infection and bone loss and fractures) - alprazolam (risk of cognitive impairment, delirium, falls, fractures, and motor vehicle crashes in older adults) - doxazosin (high risk of orthostatic hypotension) - paroxetine (highly anticholinergic, sedating, and cause orthostatic hypotension)  Drug interactions: aspirin, clopidogrel, paroxetine - may increase risk of bleeding  Other issues noted: none, patient is on medication that can elevate potassium and on potassium supplement. Most recent potassium = 3.3 (03/03/15)   Plan: 1.  I will send a letter to patient's PCP with the findings of my medication review.  Please continue to weigh the benefit versus risks of medications on the beers list.  If patient needs continued GERD treatment and clinically appropriate, please consider a H2-blocker.   2.  Per review of patient's chart, patient has a history of CAD s/p multiple DES with most recent DES to mid LAD in March 2014.  Patient is currently on dual antiplatelet therapy.  I will send a letter to patient's cardiologist to determine if DAPT is still clinically indicated. Discontinuation of clopidogrel would decrease patient's risk of bleeding.   3.  No other issues noted.  Will close pharmacy program.  Will alert [place].     [name], Pharm.D. Pharmacy Resident [place] [phone]

## 2015-04-07 ENCOUNTER — Encounter: Payer: Self-pay | Admitting: Pharmacist

## 2015-04-08 ENCOUNTER — Encounter (HOSPITAL_COMMUNITY)
Admission: RE | Admit: 2015-04-08 | Discharge: 2015-04-08 | Disposition: A | Discharge status: Home / Self Care | Payer: PPO | Source: Ambulatory Visit | Attending: Internal Medicine | Admitting: Internal Medicine

## 2015-04-08 VITALS — Wt 144.2 lb

## 2015-04-08 DIAGNOSIS — J849 Interstitial pulmonary disease, unspecified: Secondary | ICD-10-CM | POA: Diagnosis not present

## 2015-04-08 NOTE — Progress Notes (Signed)
[name] 70 y.o. female Nutrition Note Spoke with pt and pt's husband. Pt is overweight. Pt wants to lose weight and hopes that adding in exercise Rx will help with weight loss. Pt has not started her exercise routine outside of Pulmonary Rehab at this time. Pt is in the contemplative state of change. There are some ways the pt can make her eating habits healthier.  Pt's Rate Your Plate results reviewed with pt. Pt reports trying to avoid most salty food; uses some canned food. Pt rinses canned food or buys low or no added salt canned foods. Pt adds salt to food only when cooking. The role of sodium in lung disease reviewed with pt. Pt expressed understanding of the information reviewed. Pt's last A1c noted. No DM dx noted at this time. Pt and husband deny pt being diabetic. Pt c/o difficulty swallowing due to "I have to have my esophagus stretched from time to time." Drinking warm water/liquids before meals and using the chin tuck technique for swallowing discussed. Pt expressed understanding of the information reviewed via feedback method.    Lab Results  Component Value Date   HGBA1C 6.5* 10/15/2014   Nutrition Diagnosis ? Food-and nutrition-related knowledge deficit related to lack of exposure to information as related to diagnosis of pulmonary disease ? Overweight related to excessive energy intake as evidenced by a BMI of 28.6 ?  Nutrition Rx/Est. Daily Nutrition Needs for: ? wt loss 1200 Kcal  55-65 gm protein   1500 mg or less sodium Nutrition Intervention ? Pt's individual nutrition plan and goals reviewed with pt. ? Benefits of adopting healthy eating habits discussed when pt's Rate Your Plate reviewed. ? Pt to attend the Nutrition and Lung Disease class ? Handout given for potassium content of food per pt request. ? Will check with pt's PCP re: ? Further DM testing needed. ? Reviewed chin tuck technique to help with swallowing difficulty. ? Continual client-centered nutrition  education by RD, as part of interdisciplinary care. Goal(s) 1. Identify food quantities necessary to achieve wt loss of  -2# per week to a goal wt loss of 2.7-10.9 kg (6-24 lb) at graduation from pulmonary rehab. 2. Pt to be able to name foods that effect blood glucose.  Monitor and Evaluate progress toward nutrition goal with team.   Derek Mound, M.Ed, RD, LDN, CDE 04/08/2015 12:34 PM

## 2015-04-08 NOTE — Progress Notes (Signed)
Pulmonary Individual Treatment Plan  Patient Details  Name: [name] MRN: [id number] Date of Birth: 12/24/1945 Referring Provider:  Carol Ada, MD  Initial Encounter Date:       Pulmonary Rehab Walk Test from 03/20/2015 in [place]   Date  03/20/15      Visit Diagnosis: ILD (interstitial lung disease) ([place])  Patient's Home Medications on Admission:   Current outpatient prescriptions:  .  albuterol (PROVENTIL HFA;VENTOLIN HFA) 108 (90 Base) MCG/ACT inhaler, Inhale 2 puffs into the lungs every 6 (six) hours as needed for wheezing or shortness of breath., Disp: 1 Inhaler, Rfl: 2 .  ALPRAZolam (XANAX) 0.25 MG tablet, Take 0.25 mg by mouth at bedtime., Disp: , Rfl:  .  aspirin EC 81 MG tablet, Take 81 mg by mouth daily., Disp: , Rfl:  .  calcitRIOL (ROCALTROL) 0.5 MCG capsule, Take 0.5 mcg by mouth daily., Disp: , Rfl:  .  clopidogrel (PLAVIX) 75 MG tablet, Take 1 tablet (75 mg total) by mouth daily with breakfast., Disp: 30 tablet, Rfl: 10 .  diltiazem (MATZIM LA) 240 MG 24 hr tablet, Take 240 mg by mouth every morning. , Disp: , Rfl:  .  doxazosin (CARDURA) 2 MG tablet, Take 2 mg by mouth daily., Disp: , Rfl:  .  furosemide (LASIX) 20 MG tablet, Take 1 tablet (20 mg total) by mouth every other day., Disp: 15 tablet, Rfl: 6 .  isosorbide mononitrate (IMDUR) 60 MG 24 hr tablet, TAKE 1 AND 1/2 TABLETS BY MOUTH EVERY DAY (Patient taking differently: TAKE 1 AND 1/2 TABLETS= 90 mg BY MOUTH EVERY DAY), Disp: 45 tablet, Rfl: 10 .  levETIRAcetam (KEPPRA) 500 MG tablet, Take 1 tablet (500 mg total) by mouth 2 (two) times daily., Disp: 60 tablet, Rfl: 11 .  levothyroxine (SYNTHROID, LEVOTHROID) 100 MCG tablet, Take 100 mcg by mouth daily before breakfast., Disp: , Rfl:  .  loperamide (IMODIUM) 1 MG/5ML solution, Take 1 mg by mouth as needed for diarrhea or loose stools., Disp: , Rfl:  .  losartan (COZAAR) 100 MG tablet, Take 100 mg by mouth every morning. ,  Disp: , Rfl:  .  metoprolol succinate (TOPROL-XL) 50 MG 24 hr tablet, Take 50 mg by mouth daily. Take with or immediately following a meal., Disp: , Rfl:  .  nitroGLYCERIN (NITROSTAT) 0.4 MG SL tablet, Place 1 tablet (0.4 mg total) under the tongue every 5 (five) minutes as needed for chest pain., Disp: 25 tablet, Rfl: 5 .  ondansetron (ZOFRAN) 8 MG tablet, Take 4-8 mg by mouth every 6 (six) hours as needed for nausea or vomiting., Disp: , Rfl:  .  pantoprazole (PROTONIX) 40 MG tablet, TAKE 1 TABLET (40 MG TOTAL) BY MOUTH 2 (TWO) TIMES DAILY., Disp: 60 tablet, Rfl: 11 .  PARoxetine (PAXIL) 30 MG tablet, Take 30 mg by mouth daily., Disp: , Rfl:  .  Phenylephrine-DM-GG (MUCINEX FAST-MAX CONGEST COUGH) 2.5-5-100 MG/5ML LIQD, Take 20 mLs by mouth every 4 (four) hours as needed (for cough and congestion)., Disp: , Rfl:  .  potassium chloride SA (K-DUR,KLOR-CON) 20 MEQ tablet, Take 1 tablet (20 mEq total) by mouth every other day. Take with Lasix., Disp: 30 tablet, Rfl: 6 .  rosuvastatin (CRESTOR) 20 MG tablet, Take 1 tablet (20 mg total) by mouth every morning., Disp: 90 tablet, Rfl: 3  Past Medical History: Past Medical History  Diagnosis Date  . Irritable bowel syndrome   . Hypertension   . Depression   .  Hypothyroidism   . Internal hemorrhoids   . Anxiety   . Heart murmur   . Hyperlipidemia   . Vitamin D deficiency   . Chronic low back pain   . Carpal tunnel syndrome   . History of non-ST elevation myocardial infarction (NSTEMI)     2011--  S/P PCI WITH SENTING  . S/P drug eluting coronary stent placement     X3  in 2011, 2012, 2014  . GERD (gastroesophageal reflux disease)   . History of hiatal hernia   . Partial seizure disorder (HCC) NEUROLOGIST-- DR WILLIS    NOCTURNAL  . History of supraventricular tachycardia     2011-- resolved  . History of esophageal dilatation     FOR STRIUCTURE  . Gross hematuria   . Arthritis   . Cervical spondylosis without myelopathy   . S/P  pericardial cyst excision     02-05-2013  benign  . Coronary artery disease CARDIOLOGIST-  DR Irish Lack    hx NSTEMI inferoposterior 2011 s/p PCI  total x3 DES's  . History of gout   . PONV (postoperative nausea and vomiting)     hard time getting iv site-had to do neck stick 2 yrs ago  . Anginal pain ([place])     over yr last time  . Shortness of breath dyspnea   . History of kidney stones   . Seizures ([place])     none in yrs  . Pulmonary fibrosis ([place])   . Chronic kidney disease     Tobacco Use: History  Smoking status  . Former Smoker -- 0.30 packs/day for 3 years  . Types: Cigarettes  . Start date: 11/08/1964  . Quit date: 11/09/1967  Smokeless tobacco  . Never Used    Labs: Recent Review Flowsheet Data    Labs for ITP Cardiac and Pulmonary Rehab Latest Ref Rng 02/19/2014 10/15/2014 01/24/2015 01/24/2015 01/25/2015   Cholestrol 0 - 200 mg/dL 134 140 - - -   LDLCALC 0 - 99 mg/dL 55 59 - - -   HDL >40 mg/dL 59.60 67 - - -   Trlycerides <150 mg/dL 99.0 72 - - -   Hemoglobin A1c 4.8 - 5.6 % - 6.5(H) - - -   PHART 7.350 - 7.450 - - 7.358 7.276(L) 7.362   PCO2ART 35.0 - 45.0 mmHg - - 45.2(H) 56.7(H) 45.6(H)   HCO3 20.0 - 24.0 mEq/L - - 25.7(H) 26.6(H) 25.9(H)   TCO2 0 - 100 mmol/L - - '27 28 27   ' ACIDBASEDEF 0.0 - 2.0 mmol/L - - - 1.0 -   O2SAT - - - 100.0 93.0 99.0      Capillary Blood Glucose: Lab Results  Component Value Date   GLUCAP 102* 03/03/2015   GLUCAP 79 03/02/2015   GLUCAP 107* 03/01/2015   GLUCAP 110* 01/26/2015   GLUCAP 119* 01/26/2015     ADL UCSD:     Pulmonary Assessment Scores      03/27/15 1245       ADL UCSD   SOB Score total 51        Pulmonary Function Assessment:     Pulmonary Function Assessment - 03/17/15 1039    Breath   Bilateral Breath Sounds Clear   Shortness of Breath No      Exercise Target Goals:    Exercise Program Goal: Individual exercise prescription set with THRR, safety & activity barriers. Participant  demonstrates ability to understand and report RPE using BORG scale, to self-measure pulse accurately, and to  acknowledge the importance of the exercise prescription.  Exercise Prescription Goal: Starting with aerobic activity 30 plus minutes a day, 3 days per week for initial exercise prescription. Provide home exercise prescription and guidelines that participant acknowledges understanding prior to discharge.  Activity Barriers & Risk Stratification:     Activity Barriers & Cardiac Risk Stratification - 03/17/15 1036    Activity Barriers & Cardiac Risk Stratification   Activity Barriers Deconditioning;Shortness of Breath;History of Falls;Balance Concerns;Back Problems  slipped and degenerative disc disease      6 Minute Walk:     6 Minute Walk      03/20/15 1607       6 Minute Walk   Phase Initial     Distance 708 feet     Walk Time 6 minutes     # of Rest Breaks 0     MPH 1.34     METS 1.82     RPE 15     Perceived Dyspnea  2     VO2 Peak 6.37     Symptoms No     Resting HR 82 bpm     Resting BP 128/64 mmHg     Max Ex. HR 96 bpm     Max Ex. BP 134/70 mmHg     2 Minute Post BP 132/70 mmHg     Interval HR   Baseline HR 82     1 Minute HR 93     2 Minute HR 96     3 Minute HR 86     4 Minute HR 92     5 Minute HR 91     6 Minute HR 94     2 Minute Post HR 78     Interval Heart Rate? Yes     Interval Oxygen   Interval Oxygen? Yes     Baseline Oxygen Saturation % 96 %     Baseline Liters of Oxygen 0 L  Room Air     1 Minute Oxygen Saturation % 95 %     1 Minute Liters of Oxygen 0 L     2 Minute Oxygen Saturation % 96 %     2 Minute Liters of Oxygen 0 L     3 Minute Oxygen Saturation % 98 %     3 Minute Liters of Oxygen 0 L     4 Minute Oxygen Saturation % 98 %     4 Minute Liters of Oxygen 0 L     5 Minute Oxygen Saturation % 98 %     5 Minute Liters of Oxygen 0 L     6 Minute Oxygen Saturation % 99 %     6 Minute Liters of Oxygen 0 L     2 Minute Post  Oxygen Saturation % 100 %     2 Minute Post Liters of Oxygen 0 L     Pre/Post BP   Baseline BP 128/64 mmHg     6 Minute BP 134/70 mmHg     Pre/Post BP? Yes        Initial Exercise Prescription:     Initial Exercise Prescription - 03/20/15 1600    Date of Initial Exercise Prescription   Date 03/20/15   Bike   Level 0.3   Minutes 15   METs 1.8   NuStep   Level 1   Minutes 15   METs 1.8   Track   Laps 6   Minutes 15   Prescription Details  Frequency (times per week) 2   Duration Progress to 45 minutes of aerobic exercise without signs/symptoms of physical distress   Intensity   THRR 40-80% of Max Heartrate 61-122   Ratings of Perceived Exertion 11-13   Perceived Dyspnea 0-4   Progression   Progression Continue to progress workloads to maintain intensity without signs/symptoms of physical distress.   Resistance Training   Training Prescription Yes   Weight Orange Band   Reps 10-12      Perform Capillary Blood Glucose checks as needed.  Exercise Prescription Changes:     Exercise Prescription Changes      03/25/15 1300 03/27/15 1200 04/01/15 1100 04/03/15 1200 04/08/15 1200   Exercise Review   Progression Yes  Yes No No   Response to Exercise   Blood Pressure (Admit) 92/50 mmHg  recheck post hydration 123/62 90/52 mmHg 120/64 mmHg 100/60 mmHg 116/64 mmHg   Blood Pressure (Exercise) 134/66 mmHg 106/60 mmHg 120/62 mmHg 100/60 mmHg 110/56 mmHg   Blood Pressure (Exit) 108/62 mmHg 104/73 mmHg 102/56 mmHg 104/62 mmHg 100/60 mmHg   Heart Rate (Admit) 87 bpm 83 bpm 85 bpm 88 bpm 78 bpm   Heart Rate (Exercise) 112 bpm 90 bpm 107 bpm 101 bpm 81 bpm   Heart Rate (Exit) 84 bpm 82 bpm 82 bpm 80 bpm 75 bpm   Oxygen Saturation (Admit) 95 % 99 % 95 % 97 % 99 %   Oxygen Saturation (Exercise) 98 % 98 % 97 % 98 % 96 %   Oxygen Saturation (Exit) 96 % 97 % 98 % 97 % 98 %   Rating of Perceived Exertion (Exercise) '13 17 13 13 13   ' Perceived Dyspnea (Exercise) '2 2 2 2 1   ' Symptoms  none none none     Comments   Reviewed home exercise on 04/01/15     Duration Progress to 45 minutes of aerobic exercise without signs/symptoms of physical distress Progress to 45 minutes of aerobic exercise without signs/symptoms of physical distress Progress to 45 minutes of aerobic exercise without signs/symptoms of physical distress Progress to 45 minutes of aerobic exercise without signs/symptoms of physical distress Progress to 45 minutes of aerobic exercise without signs/symptoms of physical distress   Intensity Other (comment)  THR 40-80% HRR THRR unchanged THRR unchanged THRR unchanged THRR unchanged   Progression   Progression Continue to progress workloads to maintain intensity without signs/symptoms of physical distress. Continue to progress workloads to maintain intensity without signs/symptoms of physical distress. Continue to progress workloads to maintain intensity without signs/symptoms of physical distress. Continue to progress workloads to maintain intensity without signs/symptoms of physical distress. Continue to progress workloads to maintain intensity without signs/symptoms of physical distress.   Resistance Training   Training Prescription Yes Yes Yes Yes Yes   Weight Orange bands orange bands orange bands orange bands orange bands   Reps 10-12 10-12 10-12 10-12 10-12   Bike   Level 1  scifit 1 1     Minutes '15 15 15     ' NuStep   Level '2 2 3 3 3   ' Minutes '15 15 15 15 15   ' METs 1.'9 2 2 ' 2.2 2.6   Track   Laps '10  9 10 10   ' Minutes '15  15 15 15   ' Home Exercise Plan   Plans to continue exercise at   Home     Frequency   Add 1 additional day to program exercise sessions.  Exercise Comments:     Exercise Comments      04/07/15 1417           Exercise Comments Doing well with exercise tolerance, will continue to follow exericse progression          Discharge Exercise Prescription (Final Exercise Prescription Changes):     Exercise Prescription Changes  - 04/08/15 1200    Exercise Review   Progression No   Response to Exercise   Blood Pressure (Admit) 116/64 mmHg   Blood Pressure (Exercise) 110/56 mmHg   Blood Pressure (Exit) 100/60 mmHg   Heart Rate (Admit) 78 bpm   Heart Rate (Exercise) 81 bpm   Heart Rate (Exit) 75 bpm   Oxygen Saturation (Admit) 99 %   Oxygen Saturation (Exercise) 96 %   Oxygen Saturation (Exit) 98 %   Rating of Perceived Exertion (Exercise) 13   Perceived Dyspnea (Exercise) 1   Duration Progress to 45 minutes of aerobic exercise without signs/symptoms of physical distress   Intensity THRR unchanged   Progression   Progression Continue to progress workloads to maintain intensity without signs/symptoms of physical distress.   Resistance Training   Training Prescription Yes   Weight orange bands   Reps 10-12   NuStep   Level 3   Minutes 15   METs 2.6   Track   Laps 10   Minutes 15       Nutrition:  Target Goals: Understanding of nutrition guidelines, daily intake of sodium <1551m, cholesterol <2033m calories 30% from fat and 7% or less from saturated fats, daily to have 5 or more servings of fruits and vegetables.  Biometrics:     Pre Biometrics - 03/17/15 1042    Pre Biometrics   Grip Strength 16 kg  left hand       Nutrition Therapy Plan and Nutrition Goals:     Nutrition Therapy & Goals - 04/08/15 1230    Nutrition Therapy   Diet Therapeutic Lifestyle Changes   Personal Nutrition Goals   Personal Goal #1 Increase fish consumption to 1-2 times/week   Personal Goal #2 Increase fruit and veggies consumed by adding a fruit and/or veggie serving to each meal.   Intervention Plan   Intervention Prescribe, educate and counsel regarding individualized specific dietary modifications aiming towards targeted core components such as weight, hypertension, lipid management, diabetes, heart failure and other comorbidities.;Nutrition handout(s) given to patient.  High Potassium handout given per  pt's request. Pt with dx of hypokalemia.   Expected Outcomes Short Term Goal: Understand basic principles of dietary content, such as calories, fat, sodium, cholesterol and nutrients.;Long Term Goal: Adherence to prescribed nutrition plan.      Nutrition Discharge: Rate Your Plate Scores:     Nutrition Assessments - 04/08/15 1225    Rate Your Plate Scores   Pre Score 50      Psychosocial: Target Goals: Acknowledge presence or absence of depression, maximize coping skills, provide positive support system. Participant is able to verbalize types and ability to use techniques and skills needed for reducing stress and depression.  Initial Review & Psychosocial Screening:     Initial Psych Review & Screening - 03/17/15 1058    Initial Review   Current issues with Current Depression;History of Depression;Current Sleep Concerns  She has trouble going to sleep    FaLowmanYes   Barriers   Psychosocial barriers to participate in program The patient should benefit from training in stress management and  relaxation.   Screening Interventions   Interventions Encouraged to exercise      Quality of Life Scores:     Quality of Life - 03/27/15 1229    Quality of Life Scores   Health/Function Pre 14.43 %   Socioeconomic Pre 22.63 %   Psych/Spiritual Pre 21.43 %   Family Pre 20 %   GLOBAL Pre 18.41 %      PHQ-9:     Recent Review Flowsheet Data    Depression screen [facility] 2/9 04/02/2015 03/17/2015 03/13/2015 08/09/2012 05/01/2012   Decreased Interest 0  1 0 0 3   Down, Depressed, Hopeless 0 1 0 0 0   PHQ - 2 Score 0 2 0 0 3   Altered sleeping 1 3 - 0 1   Tired, decreased energy 1 3 - 1 1   Change in appetite 0 0 - 0 0   Feeling bad or failure about yourself  0 1 - 0 0   Trouble concentrating 0 0 - 0 0   Moving slowly or fidgety/restless 0 0 - 0 0   Suicidal thoughts 0 0 - 0 0   PHQ-9 Score 2 9 - 1 5   Difficult doing work/chores Not difficult at all Not  difficult at all - - -      Psychosocial Evaluation and Intervention:     Psychosocial Evaluation - 03/17/15 1100    Psychosocial Evaluation & Interventions   Interventions Relaxation education;Encouraged to exercise with the program and follow exercise prescription      Psychosocial Re-Evaluation:     Psychosocial Re-Evaluation      04/08/15 1353           Psychosocial Re-Evaluation   Interventions Encouraged to attend Pulmonary Rehabilitation for the exercise;Stress management education;Relaxation education       Comments patient attended meditation and mindfulness class and verbalized importance of usage at home inorder to decrease stress and anxiety       Continued Psychosocial Services Needed Yes         Education: Education Goals: Education classes will be provided on a weekly basis, covering required topics. Participant will state understanding/return demonstration of topics presented.  Learning Barriers/Preferences:     Learning Barriers/Preferences - 03/17/15 1038    Learning Barriers/Preferences   Learning Barriers None   Learning Preferences Audio;Verbal Instruction;Group Instruction;Individual Instruction      Education Topics: Risk Factor Reduction:  -Group instruction that is supported by a PowerPoint presentation. Instructor discusses the definition of a risk factor, different risk factors for pulmonary disease, and how the heart and lungs work together.     Nutrition for Pulmonary Patient:  -Group instruction provided by PowerPoint slides, verbal discussion, and written materials to support subject matter. The instructor gives an explanation and review of healthy diet recommendations, which includes a discussion on weight management, recommendations for fruit and vegetable consumption, as well as protein, fluid, caffeine, fiber, sodium, sugar, and alcohol. Tips for eating when patients are short of breath are discussed.          PULMONARY REHAB OTHER  RESPIRATORY from 03/27/2015 in [place]   Date  03/27/15   Educator  RD   Instruction Review Code  2- meets goals/outcomes      Pursed Lip Breathing:  -Group instruction that is supported by demonstration and informational handouts. Instructor discusses the benefits of pursed lip and diaphragmatic breathing and detailed demonstration on how to preform both.  Oxygen Safety:  -Group instruction provided by PowerPoint, verbal discussion, and written material to support subject matter. There is an overview of "What is Oxygen" and "Why do we need it".  Instructor also reviews how to create a safe environment for oxygen use, the importance of using oxygen as prescribed, and the risks of noncompliance. There is a brief discussion on traveling with oxygen and resources the patient may utilize.   Oxygen Equipment:  -Group instruction provided by [facility] Staff utilizing handouts, written materials, and equipment demonstrations.   Signs and Symptoms:  -Group instruction provided by written material and verbal discussion to support subject matter. Warning signs and symptoms of infection, stroke, and heart attack are reviewed and when to call the physician/911 reinforced. Tips for preventing the spread of infection discussed.   Advanced Directives:  -Group instruction provided by verbal instruction and written material to support subject matter. Instructor reviews Advanced Directive laws and proper instruction for filling out document.   Pulmonary Video:  -Group video education that reviews the importance of medication and oxygen compliance, exercise, good nutrition, pulmonary hygiene, and pursed lip and diaphragmatic breathing for the pulmonary patient.   Exercise for the Pulmonary Patient:  -Group instruction that is supported by a PowerPoint presentation. Instructor discusses benefits of exercise, core components of exercise, frequency, duration, and  intensity of an exercise routine, importance of utilizing pulse oximetry during exercise, safety while exercising, and options of places to exercise outside of rehab.     Pulmonary Medications:  -Verbally interactive group education provided by instructor with focus on inhaled medications and proper administration.   Anatomy and Physiology of the Respiratory System and Intimacy:  -Group instruction provided by PowerPoint, verbal discussion, and written material to support subject matter. Instructor reviews respiratory cycle and anatomical components of the respiratory system and their functions. Instructor also reviews differences in obstructive and restrictive respiratory diseases with examples of each. Intimacy, Sex, and Sexuality differences are reviewed with a discussion on how relationships can change when diagnosed with pulmonary disease. Common sexual concerns are reviewed.   Knowledge Questionnaire Score:     Knowledge Questionnaire Score - 03/27/15 1228    Knowledge Questionnaire Score   Pre Score 11/13      Core Components/Risk Factors/Patient Goals at Admission:     Personal Goals and Risk Factors at Admission - 04/08/15 1225    Core Components/Risk Factors/Patient Goals on Admission    Weight Management Weight Loss   Intervention Weight Management: Develop a combined nutrition and exercise program designed to reach desired caloric intake, while maintaining appropriate intake of nutrient and fiber, sodium and fats, and appropriate energy expenditure required for the weight goal.;Weight Management: Provide education and appropriate resources to help participant work on and attain dietary goals.  Pt is in the contemplative state of change. Hoping adding exercise per EP exercise Rx will help her lose wt.   Admit Weight 141 lb 1.5 oz (64 kg)   Expected Outcomes Short Term: Continue to assess and modify interventions until short term weight is achieved;Long Term: Adherence to  nutrition and physical activity/exercise program aimed toward attainment of established weight goal   Diabetes --  Last A1c 6.5 10/2014. Pt needs to f/u with PCP re: further DM testing      Core Components/Risk Factors/Patient Goals Review:      Goals and Risk Factor Review      04/08/15 1358           Core Components/Risk Factors/Patient Goals Review  Personal Goals Review Weight Management/Obesity;Sedentary;Improve shortness of breath with ADL's;Develop more efficient breathing techniques such as purse lipped breathing and diaphragmatic breathing and practicing self-pacing with activity.;Increase knowledge of respiratory medications and ability to use respiratory devices properly.;Increase Strength and Stamina;Other       Review see "comments" section on ITP       Expected Outcomes See Admission outcomes          Core Components/Risk Factors/Patient Goals at Discharge (Final Review):      Goals and Risk Factor Review - 04/08/15 1358    Core Components/Risk Factors/Patient Goals Review   Personal Goals Review Weight Management/Obesity;Sedentary;Improve shortness of breath with ADL's;Develop more efficient breathing techniques such as purse lipped breathing and diaphragmatic breathing and practicing self-pacing with activity.;Increase knowledge of respiratory medications and ability to use respiratory devices properly.;Increase Strength and Stamina;Other   Review see "comments" section on ITP   Expected Outcomes See Admission outcomes      ITP Comments:   Comments: Consult with RD today to discuss weight loss goals. Patient has gained 2kg since admission to the program. She reports that her shortness of breath is slightly improved but her stamina and strength has increased such that her husband has noticed. She is not utilizing pursed lip breathing to ease her exertional SOB. She is able to complete more ADLs at home. She states she better understands her diagnosis of IDL. She is  tolerating increased workloads on the exercise equipment.

## 2015-04-08 NOTE — Progress Notes (Signed)
Daily Session Note  Patient Details  Name: [name] MRN: [id number] Date of Birth: 31-Jul-1945 Referring Provider:  Carol Ada, MD  Encounter Date: 04/08/2015  Check In:     Session Check In - 04/08/15 1329    Check-In   Location [facility]   Staff Present Rosebud Poles, RN, BSN;Ramon Dredge, RN, MHA;Shamieka Gullo Rollene Rotunda, RN, Levie Heritage, MA, ACSM RCEP, Exercise Physiologist   Supervising physician immediately available to respond to emergencies [facility] immediately available   Physician(s) Dr. Marily Memos   Medication changes reported     No   Fall or balance concerns reported    No   Warm-up and Cool-down Performed as group-led instruction   Resistance Training Performed Yes   VAD Patient? No   Pain Assessment   Currently in Pain? No/denies      Capillary Blood Glucose: No results found for this or any previous visit (from the past 24 hour(s)).      Exercise Prescription Changes - 04/08/15 1200    Exercise Review   Progression No   Response to Exercise   Blood Pressure (Admit) 116/64 mmHg   Blood Pressure (Exercise) 110/56 mmHg   Blood Pressure (Exit) 100/60 mmHg   Heart Rate (Admit) 78 bpm   Heart Rate (Exercise) 81 bpm   Heart Rate (Exit) 75 bpm   Oxygen Saturation (Admit) 99 %   Oxygen Saturation (Exercise) 96 %   Oxygen Saturation (Exit) 98 %   Rating of Perceived Exertion (Exercise) 13   Perceived Dyspnea (Exercise) 1   Duration Progress to 45 minutes of aerobic exercise without signs/symptoms of physical distress   Intensity THRR unchanged   Progression   Progression Continue to progress workloads to maintain intensity without signs/symptoms of physical distress.   Resistance Training   Training Prescription Yes   Weight orange bands   Reps 10-12   NuStep   Level 3   Minutes 15   METs 2.6   Track   Laps 10   Minutes 15     Goals Met:  Improved SOB with ADL's Exercise tolerated well Personal goals  reviewed Queuing for purse lip breathing No report of cardiac concerns or symptoms Strength training completed today  Goals Unmet:  Not Applicable  Comments: Service time is from 1030 to 1200   Dr. Rush Farmer is Medical Director for Pulmonary Rehab at [facility].

## 2015-04-09 ENCOUNTER — Encounter: Payer: Self-pay | Admitting: Cardiothoracic Surgery

## 2015-04-09 ENCOUNTER — Ambulatory Visit (INDEPENDENT_AMBULATORY_CARE_PROVIDER_SITE_OTHER): Payer: Self-pay | Admitting: Cardiothoracic Surgery

## 2015-04-09 DIAGNOSIS — Z09 Encounter for follow-up examination after completed treatment for conditions other than malignant neoplasm: Secondary | ICD-10-CM

## 2015-04-09 NOTE — Progress Notes (Signed)
PCP is Reginia Naas, MD Referring Provider is Parrett, Fonnie Mu, NP  Chief Complaint  Patient presents with  . Routine Post Op    6 wk f/u.Marland KitchenMarland KitchenNO CXR    HPI: The patient presents for final postop followup after right VATS for lung biopsy. Pathology ruled out pulmonary fibrosis . The patient is being followed by Dr. Chase Caller. She has recovered from the surgery well. She had a gastrointestinal virus with dehydration which required hospitalization late last month. She is now doing much better with attention to  Hydration. She has minimal post thoracotomy pain. Minimal shortness of breath except with exercise. Past Medical History  Diagnosis Date  . Irritable bowel syndrome   . Hypertension   . Depression   . Hypothyroidism   . Internal hemorrhoids   . Anxiety   . Heart murmur   . Hyperlipidemia   . Vitamin D deficiency   . Chronic low back pain   . Carpal tunnel syndrome   . History of non-ST elevation myocardial infarction (NSTEMI)     2011--  S/P PCI WITH SENTING  . S/P drug eluting coronary stent placement     X3  in 2011, 2012, 2014  . GERD (gastroesophageal reflux disease)   . History of hiatal hernia   . Partial seizure disorder (HCC) NEUROLOGIST-- DR WILLIS    NOCTURNAL  . History of supraventricular tachycardia     2011-- resolved  . History of esophageal dilatation     FOR STRIUCTURE  . Gross hematuria   . Arthritis   . Cervical spondylosis without myelopathy   . S/P pericardial cyst excision     02-05-2013  benign  . Coronary artery disease CARDIOLOGIST-  DR Irish Lack    hx NSTEMI inferoposterior 2011 s/p PCI  total x3 DES's  . History of gout   . PONV (postoperative nausea and vomiting)     hard time getting iv site-had to do neck stick 2 yrs ago  . Anginal pain ([place])     over yr last time  . Shortness of breath dyspnea   . History of kidney stones   . Seizures ([place])     none in yrs  . Pulmonary fibrosis ([place])   . Chronic kidney disease     Past  Surgical History  Procedure Laterality Date  . Mediasternotomy N/A 02/05/2013    Procedure: MEDIAN STERNOTOMY;  Surgeon: Ivin Poot, MD;  Location: [facility];  Service: Thoracic;  Laterality: N/A;  . Biopsy of mediastinal mass N/A 02/05/2013    Procedure: RESECTION OF MEDIASTINAL MASS;  Surgeon: Ivin Poot, MD;  Location: [facility] OR;  Service: Thoracic;  Laterality: N/A;  . Ectopic pregnancy surgery  YRS AGO    SALPINGECTOMY  . Coronary angiogram  03/10/2011    Procedure: CORONARY ANGIOGRAM;  Surgeon: Jettie Booze, MD;  Location: [facility] C[facility] LAB;  Service: Cardiovascular;;  . Left heart catheterization with coronary angiogram N/A 03/14/2012    Procedure: LEFT HEART CATHETERIZATION WITH CORONARY ANGIOGRAM;  Surgeon: Sueanne Margarita, MD;  Location: [place] CATH LAB;  Service: Cardiovascular;  Laterality: N/A;  Normal LM,  50% pLAD,  70% mLAD,  D2 50-70%, very tortuous LAD,  70-82% ostial LCFX,  50% in-stent restenosis of  dRCA and mRCA  stent and 50-70% ostial PDA,  normal LVSF, ef 60%  . Percutaneous coronary stent intervention (pci-s) N/A 04/03/2012    Procedure: PERCUTANEOUS CORONARY STENT INTERVENTION (PCI-S);  Surgeon: Jettie Booze, MD;  Location: [facility] CATH LAB;  Service:  Cardiovascular;  Laterality: N/A;   Successful PCI  mLAD with 2.75x12 Promus stent, postdilated to >0.39mm  . Coronary angioplasty with stent placement  11-22-2009  dr Irish Lack    Acute inferoposterior MI/  ef 60%,  PCI with DES x1 to dRCA,  25%  mLAD  . Coronary angioplasty with stent placement  06-26-2010  dr Daneen Schick    Cutting balloon angioplasty to dRCA with DES x1,  40% mLAD (non-obstructive CAD)  . Transthoracic echocardiogram  11-17-2011    grade I diastolic dysfunction/  ef 55-60%  . Cardiovascular stress test  11-22-2013  dr Irish Lack    normal lexiscan study/  no ischemia/  normal LVF and wall motion/ ef 81%  . Esophagogastroduodenoscopy (egd) with esophageal dilation  05-20-2011  . Colonoscopy  last one 2014  .  Needle guided excision breast calcifications Right 08-09-2008  . Cystoscopy with retrograde pyelogram, ureteroscopy and stent placement Right 02/13/2014    Procedure: CYSTOSCOPY WITH RETROGRADE PYELOGRAM, URETEROSCOPY AND STENT PLACEMENT;  Surgeon: Bernestine Amass, MD;  Location: [facility];  Service: Urology;  Laterality: Right;  . Holmium laser application Right [id number]    Procedure: HOLMIUM LASER APPLICATION;  Surgeon: Bernestine Amass, MD;  Location: [facility];  Service: Urology;  Laterality: Right;  . Esophagogastroduodenoscopy N/A 02/15/2014    Procedure: ESOPHAGOGASTRODUODENOSCOPY (EGD);  Surgeon: Jerene Bears, MD;  Location: [facility];  Service: Endoscopy;  Laterality: N/A;  . Lung biopsy Right 01/24/2015    Procedure: RIGHT LUNG BIOPSY;  Surgeon: Ivin Poot, MD;  Location: [place];  Service: Thoracic;  Laterality: Right;  . Video bronchoscopy N/A 01/24/2015    Procedure: RIGHT VIDEO BRONCHOSCOPY;  Surgeon: Ivin Poot, MD;  Location: [facility] OR;  Service: Thoracic;  Laterality: N/A;  . Cardiac catheterization Right 01/24/2015    Procedure: right femoral ARTERIAL LINE INSERTION;  Surgeon: Ivin Poot, MD;  Location: [place];  Service: Thoracic;  Laterality: Right;  . Thoracotomy Right 01/24/2015    Procedure: RIGHT MINI/LIMITED THORACOTOMY;  Surgeon: Ivin Poot, MD;  Location: [facility] OR;  Service: Thoracic;  Laterality: Right;    Family History  Problem Relation Age of Onset  . Breast cancer Sister   . Cancer Sister     breast  . Breast cancer      niece  . Heart disease Father   . Hypertension Father   . Emphysema Father   . Coronary artery disease Father   . Diabetes Mellitus I Father   . Colon cancer Neg Hx   . Heart attack Mother   . CVA Mother     Social History Social History  Substance Use Topics  . Smoking status: Former Smoker -- 0.30 packs/day for 3 years    Types: Cigarettes    Start date: 11/08/1964    Quit date: 11/09/1967   . Smokeless tobacco: Never Used  . Alcohol Use: No    Current Outpatient Prescriptions  Medication Sig Dispense Refill  . ALPRAZolam (XANAX) 0.25 MG tablet Take 0.25 mg by mouth at bedtime.    Marland Kitchen aspirin EC 81 MG tablet Take 81 mg by mouth daily.    . calcitRIOL (ROCALTROL) 0.5 MCG capsule Take 0.5 mcg by mouth daily.    . clopidogrel (PLAVIX) 75 MG tablet Take 1 tablet (75 mg total) by mouth daily with breakfast. 30 tablet 10  . diltiazem (MATZIM LA) 240 MG 24 hr tablet Take 240 mg by mouth every morning.     Marland Kitchen  doxazosin (CARDURA) 2 MG tablet Take 2 mg by mouth daily.    . furosemide (LASIX) 20 MG tablet Take 1 tablet (20 mg total) by mouth every other day. 15 tablet 6  . isosorbide mononitrate (IMDUR) 60 MG 24 hr tablet TAKE 1 AND 1/2 TABLETS BY MOUTH EVERY DAY (Patient taking differently: TAKE 1 AND 1/2 TABLETS= 90 mg BY MOUTH EVERY DAY) 45 tablet 10  . levETIRAcetam (KEPPRA) 500 MG tablet Take 1 tablet (500 mg total) by mouth 2 (two) times daily. 60 tablet 11  . levothyroxine (SYNTHROID, LEVOTHROID) 100 MCG tablet Take 100 mcg by mouth daily before breakfast.    . loperamide (IMODIUM) 1 MG/5ML solution Take 1 mg by mouth as needed for diarrhea or loose stools.    Marland Kitchen losartan (COZAAR) 100 MG tablet Take 100 mg by mouth every morning.     . metoprolol succinate (TOPROL-XL) 50 MG 24 hr tablet Take 50 mg by mouth daily. Take with or immediately following a meal.    . nitroGLYCERIN (NITROSTAT) 0.4 MG SL tablet Place 1 tablet (0.4 mg total) under the tongue every 5 (five) minutes as needed for chest pain. 25 tablet 5  . ondansetron (ZOFRAN) 8 MG tablet Take 4-8 mg by mouth every 6 (six) hours as needed for nausea or vomiting.    . pantoprazole (PROTONIX) 40 MG tablet TAKE 1 TABLET (40 MG TOTAL) BY MOUTH 2 (TWO) TIMES DAILY. 60 tablet 11  . PARoxetine (PAXIL) 30 MG tablet Take 30 mg by mouth daily.    Marland Kitchen Phenylephrine-DM-GG (MUCINEX FAST-MAX CONGEST COUGH) 2.5-5-100 MG/5ML LIQD Take 20 mLs by  mouth every 4 (four) hours as needed (for cough and congestion).    . potassium chloride SA (K-DUR,KLOR-CON) 20 MEQ tablet Take 1 tablet (20 mEq total) by mouth every other day. Take with Lasix. 30 tablet 6  . rosuvastatin (CRESTOR) 20 MG tablet Take 1 tablet (20 mg total) by mouth every morning. 90 tablet 3  . albuterol (PROVENTIL HFA;VENTOLIN HFA) 108 (90 Base) MCG/ACT inhaler Inhale 2 puffs into the lungs every 6 (six) hours as needed for wheezing or shortness of breath. (Patient not taking: Reported on 04/09/2015) 1 Inhaler 2   No current facility-administered medications for this visit.    Allergies  Allergen Reactions  . Tape Rash    ? Clear tape    Review of Systems  Making good progress at pulmonary rehabilitation. Improved appetite and strength.  BP 119/80 mmHg  Pulse 77  Resp 16  Ht 4\' 11"  (1.499 m)  Wt 142 lb (64.411 kg)  BMI 28.67 kg/m2  SpO2 98% Physical Exam Alert and comfortable Lungs clear Heart rate regular Right VATS incision well-healed No peripheral edema  Diagnostic Tests: Chest x-ray clear  Impression: Doing well 2 months status post right VATS for lung biopsy She has no limitations of activity at this point. Plan: Return as needed.  Len Childs, MD [facility] [phone]

## 2015-04-10 ENCOUNTER — Encounter (HOSPITAL_COMMUNITY)
Admission: RE | Admit: 2015-04-10 | Discharge: 2015-04-10 | Disposition: A | Discharge status: Home / Self Care | Payer: PPO | Source: Ambulatory Visit | Attending: Internal Medicine | Admitting: Internal Medicine

## 2015-04-10 VITALS — Wt 146.2 lb

## 2015-04-10 DIAGNOSIS — J849 Interstitial pulmonary disease, unspecified: Secondary | ICD-10-CM | POA: Diagnosis not present

## 2015-04-10 NOTE — Progress Notes (Signed)
Daily Session Note  Patient Details  Name: [name] MRN: [id number] Date of Birth: Mar 19, 1945 Referring Provider:  Carol Ada, MD  Encounter Date: 04/10/2015  Check In:     Session Check In - 04/10/15 1234    Check-In   Location [facility]   Staff Present Rosebud Poles, RN, BSN;Damaris Geers Ysidro Evert, RN;Portia Rollene Rotunda, RN, BSN;Molly diVincenzo, MS, ACSM RCEP, Exercise Physiologist   Supervising physician immediately available to respond to emergencies [facility] immediately available   Physician(s) Dr. Marily Memos   Medication changes reported     No   Fall or balance concerns reported    No   Warm-up and Cool-down Performed as group-led instruction   Resistance Training Performed Yes   VAD Patient? No   Pain Assessment   Currently in Pain? No/denies   Multiple Pain Sites No      Capillary Blood Glucose: No results found for this or any previous visit (from the past 24 hour(s)).      Exercise Prescription Changes - 04/10/15 1200    Exercise Review   Progression No   Response to Exercise   Blood Pressure (Admit) 108/58 mmHg   Blood Pressure (Exercise) 108/62 mmHg   Blood Pressure (Exit) 102/60 mmHg   Heart Rate (Admit) 77 bpm   Heart Rate (Exercise) 92 bpm   Heart Rate (Exit) 74 bpm   Oxygen Saturation (Admit) 99 %   Oxygen Saturation (Exercise) 94 %   Oxygen Saturation (Exit) 97 %   Rating of Perceived Exertion (Exercise) 13   Perceived Dyspnea (Exercise) 2   Duration Progress to 45 minutes of aerobic exercise without signs/symptoms of physical distress   Intensity THRR unchanged   Progression   Progression Continue to progress workloads to maintain intensity without signs/symptoms of physical distress.   Resistance Training   Training Prescription Yes   Weight orange bands   Reps 10-12   Bike   Level 2   Minutes 15   NuStep   Level 3   Minutes 15   METs 2.4     Goals Met:  Exercise tolerated well No report of cardiac concerns or  symptoms Strength training completed today  Goals Unmet:  Not Applicable  Comments: Service time is from 1030 to 1220    Dr. Rush Farmer is Medical Director for Pulmonary Rehab at [facility].

## 2015-04-15 ENCOUNTER — Encounter (HOSPITAL_COMMUNITY)
Admission: RE | Admit: 2015-04-15 | Discharge: 2015-04-15 | Disposition: A | Discharge status: Home / Self Care | Payer: PPO | Source: Ambulatory Visit | Attending: Internal Medicine | Admitting: Internal Medicine

## 2015-04-15 VITALS — Wt 146.8 lb

## 2015-04-15 DIAGNOSIS — J849 Interstitial pulmonary disease, unspecified: Secondary | ICD-10-CM | POA: Diagnosis not present

## 2015-04-15 NOTE — Progress Notes (Signed)
Daily Session Note  Patient Details  Name: [name] MRN: [id number] Date of Birth: 30-Dec-1945 Referring Provider:  Carol Ada, MD  Encounter Date: 04/15/2015  Check In:     Session Check In - 04/15/15 1025    Check-In   [facility]   Staff Present Ramon Dredge, RN, MHA;Leonda Cristo Ysidro Evert, RN;Portia Rollene Rotunda, RN, Levie Heritage, MA, ACSM RCEP, Exercise Physiologist   Supervising physician immediately available to respond to emergencies [facility] immediately available   Physician(s) Dr. Marily Memos   Medication changes reported     No   Fall or balance concerns reported    No   Warm-up and Cool-down Performed as group-led instruction   VAD Patient? No   Pain Assessment   Currently in Pain? No/denies   Multiple Pain Sites No      Capillary Blood Glucose: No results found for this or any previous visit (from the past 24 hour(s)).      Exercise Prescription Changes - 04/15/15 1200    Exercise Review   Progression Yes   Response to Exercise   Blood Pressure (Admit) 98/66 mmHg   Blood Pressure (Exercise) 132/64 mmHg   Blood Pressure (Exit) 100/60 mmHg   Heart Rate (Admit) 84 bpm   Heart Rate (Exercise) 102 bpm   Heart Rate (Exit) 85 bpm   Oxygen Saturation (Admit) 96 %   Oxygen Saturation (Exercise) 98 %   Oxygen Saturation (Exit) 96 %   Rating of Perceived Exertion (Exercise) 13   Perceived Dyspnea (Exercise) 2   Duration Progress to 45 minutes of aerobic exercise without signs/symptoms of physical distress   Intensity THRR unchanged   Progression   Progression Continue to progress workloads to maintain intensity without signs/symptoms of physical distress.   Resistance Training   Training Prescription Yes   Weight orange bands   Reps 10-12   Bike   Level 2   Minutes 15   NuStep   Level 3   Minutes 15   METs 2.5   Track   Laps 7  added steps to track   Minutes 15     Goals Met:  Exercise tolerated well No report  of cardiac concerns or symptoms Strength training completed today  Goals Unmet:  Not Applicable  Comments: Service time is from 1030 to 1210    Dr. Rush Farmer is Medical Director for Pulmonary Rehab at [facility].

## 2015-04-17 ENCOUNTER — Encounter (HOSPITAL_COMMUNITY)
Admission: RE | Admit: 2015-04-17 | Discharge: 2015-04-17 | Disposition: A | Discharge status: Home / Self Care | Payer: PPO | Source: Ambulatory Visit | Attending: Internal Medicine | Admitting: Internal Medicine

## 2015-04-17 VITALS — Wt 147.0 lb

## 2015-04-17 DIAGNOSIS — J849 Interstitial pulmonary disease, unspecified: Secondary | ICD-10-CM

## 2015-04-17 NOTE — Progress Notes (Signed)
Daily Session Note  Patient Details  Name: [name] MRN: [id number] Date of Birth: 11-12-1945 Referring Provider:  Brand Males, MD  Encounter Date: 04/17/2015  Check In:     Session Check In - 04/17/15 1207    Check-In   [facility]   Staff Present Rosebud Poles, RN, BSN;Molly diVincenzo, MS, ACSM RCEP, Exercise Physiologist;Constant Mandeville Rollene Rotunda, RN, Roque Cash, RN   Supervising physician immediately available to respond to emergencies [facility] immediately available   Physician(s) Dr, Loleta Books   Medication changes reported     No   Fall or balance concerns reported    No   Warm-up and Cool-down Performed as group-led instruction   Resistance Training Performed Yes   VAD Patient? No   Pain Assessment   Currently in Pain? No/denies   Multiple Pain Sites No      Capillary Blood Glucose: No results found for this or any previous visit (from the past 24 hour(s)).      Exercise Prescription Changes - 04/17/15 1200    Exercise Review   Progression No   Response to Exercise   Blood Pressure (Admit) 106/66 mmHg   Blood Pressure (Exercise) 110/60 mmHg   Blood Pressure (Exit) 104/60 mmHg   Heart Rate (Admit) 91 bpm   Heart Rate (Exercise) 101 bpm   Heart Rate (Exit) 75 bpm   Oxygen Saturation (Admit) 97 %   Oxygen Saturation (Exercise) 98 %   Oxygen Saturation (Exit) 97 %   Rating of Perceived Exertion (Exercise) 13   Perceived Dyspnea (Exercise) 2   Duration Progress to 45 minutes of aerobic exercise without signs/symptoms of physical distress   Intensity THRR unchanged   Progression   Progression Continue to progress workloads to maintain intensity without signs/symptoms of physical distress.   Resistance Training   Training Prescription Yes   Weight orange bands   Reps 10-12   Oxygen   Oxygen --  room air   Bike   Level 2   Minutes 15   NuStep   Level 3   Minutes 15   METs 2.5     Goals Met:  Using PLB without cueing  & demonstrates good technique Exercise tolerated well No report of cardiac concerns or symptoms Strength training completed today  Goals Unmet:  Not Applicable  Comments: Service time is from 1030 to 1230   Dr. Rush Farmer is Medical Director for Pulmonary Rehab at [facility].

## 2015-04-22 ENCOUNTER — Encounter (HOSPITAL_COMMUNITY)
Admission: RE | Admit: 2015-04-22 | Discharge: 2015-04-22 | Disposition: A | Discharge status: Home / Self Care | Payer: PPO | Source: Ambulatory Visit | Attending: Internal Medicine | Admitting: Internal Medicine

## 2015-04-22 VITALS — Wt 147.0 lb

## 2015-04-22 DIAGNOSIS — J849 Interstitial pulmonary disease, unspecified: Secondary | ICD-10-CM | POA: Diagnosis not present

## 2015-04-22 NOTE — Progress Notes (Signed)
Daily Session Note  Patient Details  Name: [name] MRN: [id number] Date of Birth: May 19, 1945 Referring Provider:  Carol Ada, MD  Encounter Date: 04/22/2015  Check In:     Session Check In - 04/22/15 1101    Check-In   [facility]   Staff Present Rodney Langton, RN;Maurissa Ambrose Rollene Rotunda, RN, Deland Pretty, MS, ACSM CEP, Exercise Physiologist;Molly diVincenzo, MS, ACSM RCEP, Exercise Physiologist   Supervising physician immediately available to respond to emergencies [facility] immediately available   Physician(s) Dr. Marily Memos   Medication changes reported     No   Fall or balance concerns reported    No   Warm-up and Cool-down Performed as group-led instruction   Resistance Training Performed Yes   VAD Patient? No   Pain Assessment   Currently in Pain? No/denies   Multiple Pain Sites No      Capillary Blood Glucose: No results found for this or any previous visit (from the past 24 hour(s)).      Exercise Prescription Changes - 04/22/15 1200    Exercise Review   Progression Yes   Response to Exercise   Blood Pressure (Admit) 110/70 mmHg   Blood Pressure (Exercise) 122/72 mmHg   Blood Pressure (Exit) 100/56 mmHg   Heart Rate (Admit) 84 bpm   Heart Rate (Exercise) 99 bpm   Heart Rate (Exit) 90 bpm   Oxygen Saturation (Admit) 98 %   Oxygen Saturation (Exercise) 92 %   Oxygen Saturation (Exit) 96 %   Rating of Perceived Exertion (Exercise) 13   Perceived Dyspnea (Exercise) 1   Duration Progress to 45 minutes of aerobic exercise without signs/symptoms of physical distress   Intensity THRR unchanged   Progression   Progression Continue to progress workloads to maintain intensity without signs/symptoms of physical distress.   Resistance Training   Training Prescription Yes   Weight orange bands   Reps 10-12   Oxygen   Oxygen --  room air   Bike   Level 2   Minutes 15   NuStep   Level 4   Minutes 15   METs 2.5   Track   Laps 12   Minutes 15     Goals Met:  Exercise tolerated well Queuing for purse lip breathing No report of cardiac concerns or symptoms Strength training completed today  Goals Unmet:  Not Applicable  Comments: Service time is from 1030 to 1200   Dr. Rush Farmer is Medical Director for Pulmonary Rehab at [facility].

## 2015-04-24 ENCOUNTER — Encounter (HOSPITAL_COMMUNITY)
Admission: RE | Admit: 2015-04-24 | Discharge: 2015-04-24 | Disposition: A | Discharge status: Home / Self Care | Payer: PPO | Source: Ambulatory Visit | Attending: Internal Medicine | Admitting: Internal Medicine

## 2015-04-24 DIAGNOSIS — J849 Interstitial pulmonary disease, unspecified: Secondary | ICD-10-CM | POA: Diagnosis not present

## 2015-04-24 NOTE — Progress Notes (Signed)
Daily Session Note  Patient Details  Name: [name] MRN: [id number] Date of Birth: 11/22/1945 Referring Provider:    Encounter Date: 04/24/2015  Check In:     Session Check In - 04/24/15 1217    Check-In   Staff Present Su Hilt, MS, ACSM RCEP, Exercise Physiologist;Portia Rollene Rotunda, RN, Marga Melnick, RN, BSN;Ramon Dredge, RN, [facility]   Supervising physician immediately available to respond to emergencies [facility] immediately available   Physician(s) Dr. Freda Munro   Medication changes reported     No   Fall or balance concerns reported    No   Warm-up and Cool-down Performed as group-led instruction   Resistance Training Performed Yes   VAD Patient? No   Pain Assessment   Currently in Pain? No/denies   Multiple Pain Sites No      Capillary Blood Glucose: No results found for this or any previous visit (from the past 24 hour(s)).      Exercise Prescription Changes - 04/24/15 1200    Exercise Review   Progression No   Response to Exercise   Blood Pressure (Admit) 92/52 mmHg   Blood Pressure (Exercise) 112/60 mmHg   Blood Pressure (Exit) 94/60 mmHg   Heart Rate (Admit) 85 bpm   Heart Rate (Exercise) 98 bpm   Heart Rate (Exit) 87 bpm   Oxygen Saturation (Admit) 89 %   Oxygen Saturation (Exercise) 98 %   Oxygen Saturation (Exit) 97 %   Rating of Perceived Exertion (Exercise) 11   Perceived Dyspnea (Exercise) 1   Duration Progress to 45 minutes of aerobic exercise without signs/symptoms of physical distress   Intensity THRR unchanged   Progression   Progression Continue to progress workloads to maintain intensity without signs/symptoms of physical distress.   Resistance Training   Training Prescription Yes   Weight orange bands   Reps 10-12   NuStep   Level 4   Minutes 15   METs 2.4   Rower   Level 1   Minutes 8     Goals Met:  Exercise tolerated well No report of cardiac concerns or symptoms Strength training completed today  Goals  Unmet:  Not Applicable  Comments: Service time is from 1030 to 1215    Dr. Rush Farmer is Medical Director for Pulmonary Rehab at [facility].

## 2015-04-25 DIAGNOSIS — N183 Chronic kidney disease, stage 3 (moderate): Secondary | ICD-10-CM | POA: Diagnosis not present

## 2015-04-28 DIAGNOSIS — J849 Interstitial pulmonary disease, unspecified: Secondary | ICD-10-CM | POA: Diagnosis not present

## 2015-04-28 DIAGNOSIS — R0602 Shortness of breath: Secondary | ICD-10-CM | POA: Diagnosis not present

## 2015-04-29 ENCOUNTER — Encounter (HOSPITAL_COMMUNITY)
Admission: RE | Admit: 2015-04-29 | Discharge: 2015-04-29 | Disposition: A | Discharge status: Home / Self Care | Payer: PPO | Source: Ambulatory Visit | Attending: Internal Medicine | Admitting: Internal Medicine

## 2015-04-29 VITALS — Wt 146.8 lb

## 2015-04-29 DIAGNOSIS — E559 Vitamin D deficiency, unspecified: Secondary | ICD-10-CM | POA: Diagnosis not present

## 2015-04-29 DIAGNOSIS — R3129 Other microscopic hematuria: Secondary | ICD-10-CM | POA: Insufficient documentation

## 2015-04-29 DIAGNOSIS — I129 Hypertensive chronic kidney disease with stage 1 through stage 4 chronic kidney disease, or unspecified chronic kidney disease: Secondary | ICD-10-CM | POA: Diagnosis not present

## 2015-04-29 DIAGNOSIS — M898X9 Other specified disorders of bone, unspecified site: Secondary | ICD-10-CM | POA: Insufficient documentation

## 2015-04-29 DIAGNOSIS — J849 Interstitial pulmonary disease, unspecified: Secondary | ICD-10-CM

## 2015-04-29 DIAGNOSIS — M9080 Osteopathy in diseases classified elsewhere, unspecified site: Secondary | ICD-10-CM | POA: Diagnosis not present

## 2015-04-29 DIAGNOSIS — E889 Metabolic disorder, unspecified: Secondary | ICD-10-CM | POA: Diagnosis not present

## 2015-04-29 DIAGNOSIS — N183 Chronic kidney disease, stage 3 (moderate): Secondary | ICD-10-CM | POA: Diagnosis not present

## 2015-04-29 DIAGNOSIS — D631 Anemia in chronic kidney disease: Secondary | ICD-10-CM | POA: Insufficient documentation

## 2015-04-29 NOTE — Progress Notes (Signed)
Daily Session Note  Patient Details  Name: [name] MRN: [id number] Date of Birth: 12/22/1945 Referring Provider:    Encounter Date: 04/29/2015  Check In:     Session Check In - 04/29/15 0955    Check-In   Location [facility]   Staff Present Molly diVincenzo, MS, ACSM RCEP, Exercise Physiologist;Joan Behrens, RN, BSN; , RN, BSN;Annedrea Stackhouse, RN, MHA   Supervising physician immediately available to respond to emergencies [facility] immediately available   Physician(s) Dr. Merrell   Medication changes reported     No   Fall or balance concerns reported    No   Warm-up and Cool-down Performed as group-led instruction   Resistance Training Performed Yes   VAD Patient? No   Pain Assessment   Currently in Pain? No/denies   Multiple Pain Sites No      Capillary Blood Glucose: No results found for this or any previous visit (from the past 24 hour(s)).      Exercise Prescription Changes - 04/29/15 1246    Exercise Review   Progression --   Response to Exercise   Blood Pressure (Admit) 120/66 mmHg   Blood Pressure (Exercise) 130/80 mmHg   Blood Pressure (Exit) 118/60 mmHg   Heart Rate (Admit) 86 bpm   Heart Rate (Exercise) 113 bpm   Heart Rate (Exit) 88 bpm   Oxygen Saturation (Admit) 95 %   Oxygen Saturation (Exercise) 95 %   Oxygen Saturation (Exit) 99 %   Rating of Perceived Exertion (Exercise) 13   Perceived Dyspnea (Exercise) 1   Duration Progress to 45 minutes of aerobic exercise without signs/symptoms of physical distress   Intensity THRR unchanged   Progression   Progression Continue to progress workloads to maintain intensity without signs/symptoms of physical distress.   Resistance Training   Training Prescription Yes   Weight orange bands   Reps 10-12   NuStep   Level 4   Minutes 15   METs 2.5   Rower   Level 1   Minutes 15   Track   Laps 13   Minutes 15     Goals Met:  Improved SOB with ADL's Using  PLB without cueing & demonstrates good technique Exercise tolerated well Personal goals reviewed No report of cardiac concerns or symptoms Strength training completed today  Goals Unmet:  Not Applicable  Comments: Service time is from 1030 to 1210   Dr. Wesam G. Yacoub is Medical Director for Pulmonary Rehab at [facility]. 

## 2015-04-30 ENCOUNTER — Ambulatory Visit: Payer: Self-pay

## 2015-05-01 ENCOUNTER — Other Ambulatory Visit: Payer: Self-pay

## 2015-05-01 ENCOUNTER — Encounter (HOSPITAL_COMMUNITY)
Admission: RE | Admit: 2015-05-01 | Discharge: 2015-05-01 | Disposition: A | Discharge status: Home / Self Care | Payer: PPO | Source: Ambulatory Visit | Attending: Internal Medicine | Admitting: Internal Medicine

## 2015-05-01 VITALS — Wt 146.2 lb

## 2015-05-01 DIAGNOSIS — J849 Interstitial pulmonary disease, unspecified: Secondary | ICD-10-CM | POA: Diagnosis not present

## 2015-05-01 NOTE — Progress Notes (Signed)
Pulmonary Individual Treatment Plan  Patient Details  Name: [name] MRN: [id number] Date of Birth: [birth year] Referring Provider:    Initial Encounter Date:       Pulmonary Rehab Walk Test from 03/20/2015 in [place]   Date  03/20/15      Visit Diagnosis: No diagnosis found.  Patient's Home Medications on Admission:   Current outpatient prescriptions:  .  albuterol (PROVENTIL HFA;VENTOLIN HFA) 108 (90 Base) MCG/ACT inhaler, Inhale 2 puffs into the lungs every 6 (six) hours as needed for wheezing or shortness of breath. (Patient not taking: Reported on 04/09/2015), Disp: 1 Inhaler, Rfl: 2 .  ALPRAZolam (XANAX) 0.25 MG tablet, Take 0.25 mg by mouth at bedtime., Disp: , Rfl:  .  aspirin EC 81 MG tablet, Take 81 mg by mouth daily., Disp: , Rfl:  .  calcitRIOL (ROCALTROL) 0.5 MCG capsule, Take 0.5 mcg by mouth daily., Disp: , Rfl:  .  clopidogrel (PLAVIX) 75 MG tablet, Take 1 tablet (75 mg total) by mouth daily with breakfast., Disp: 30 tablet, Rfl: 10 .  diltiazem (MATZIM LA) 240 MG 24 hr tablet, Take 240 mg by mouth every morning. , Disp: , Rfl:  .  doxazosin (CARDURA) 2 MG tablet, Take 2 mg by mouth daily., Disp: , Rfl:  .  furosemide (LASIX) 20 MG tablet, Take 1 tablet (20 mg total) by mouth every other day., Disp: 15 tablet, Rfl: 6 .  isosorbide mononitrate (IMDUR) 60 MG 24 hr tablet, TAKE 1 AND 1/2 TABLETS BY MOUTH EVERY DAY (Patient taking differently: TAKE 1 AND 1/2 TABLETS= 90 mg BY MOUTH EVERY DAY), Disp: 45 tablet, Rfl: 10 .  levETIRAcetam (KEPPRA) 500 MG tablet, Take 1 tablet (500 mg total) by mouth 2 (two) times daily., Disp: 60 tablet, Rfl: 11 .  levothyroxine (SYNTHROID, LEVOTHROID) 100 MCG tablet, Take 100 mcg by mouth daily before breakfast., Disp: , Rfl:  .  loperamide (IMODIUM) 1 MG/5ML solution, Take 1 mg by mouth as needed for diarrhea or loose stools., Disp: , Rfl:  .  losartan (COZAAR) 100 MG tablet, Take 100 mg by mouth every  morning. , Disp: , Rfl:  .  metoprolol succinate (TOPROL-XL) 50 MG 24 hr tablet, Take 50 mg by mouth daily. Take with or immediately following a meal., Disp: , Rfl:  .  nitroGLYCERIN (NITROSTAT) 0.4 MG SL tablet, Place 1 tablet (0.4 mg total) under the tongue every 5 (five) minutes as needed for chest pain., Disp: 25 tablet, Rfl: 5 .  ondansetron (ZOFRAN) 8 MG tablet, Take 4-8 mg by mouth every 6 (six) hours as needed for nausea or vomiting., Disp: , Rfl:  .  pantoprazole (PROTONIX) 40 MG tablet, TAKE 1 TABLET (40 MG TOTAL) BY MOUTH 2 (TWO) TIMES DAILY., Disp: 60 tablet, Rfl: 11 .  PARoxetine (PAXIL) 30 MG tablet, Take 30 mg by mouth daily., Disp: , Rfl:  .  Phenylephrine-DM-GG (MUCINEX FAST-MAX CONGEST COUGH) 2.5-5-100 MG/5ML LIQD, Take 20 mLs by mouth every 4 (four) hours as needed (for cough and congestion)., Disp: , Rfl:  .  potassium chloride SA (K-DUR,KLOR-CON) 20 MEQ tablet, Take 1 tablet (20 mEq total) by mouth every other day. Take with Lasix., Disp: 30 tablet, Rfl: 6 .  rosuvastatin (CRESTOR) 20 MG tablet, Take 1 tablet (20 mg total) by mouth every morning., Disp: 90 tablet, Rfl: 3  Past Medical History: Past Medical History  Diagnosis Date  . Irritable bowel syndrome   . Hypertension   .  Depression   . Hypothyroidism   . Internal hemorrhoids   . Anxiety   . Heart murmur   . Hyperlipidemia   . Vitamin D deficiency   . Chronic low back pain   . Carpal tunnel syndrome   . History of non-ST elevation myocardial infarction (NSTEMI)     2011--  S/P PCI WITH SENTING  . S/P drug eluting coronary stent placement     X3  in 2011, 2012, 2014  . GERD (gastroesophageal reflux disease)   . History of hiatal hernia   . Partial seizure disorder (HCC) NEUROLOGIST-- DR WILLIS    NOCTURNAL  . History of supraventricular tachycardia     2011-- resolved  . History of esophageal dilatation     FOR STRIUCTURE  . Gross hematuria   . Arthritis   . Cervical spondylosis without myelopathy   .  S/P pericardial cyst excision     02-05-2013  benign  . Coronary artery disease CARDIOLOGIST-  DR Irish Lack    hx NSTEMI inferoposterior 2011 s/p PCI  total x3 DES's  . History of gout   . PONV (postoperative nausea and vomiting)     hard time getting iv site-had to do neck stick 2 yrs ago  . Anginal pain ([place])     over yr last time  . Shortness of breath dyspnea   . History of kidney stones   . Seizures ([place])     none in yrs  . Pulmonary fibrosis ([place])   . Chronic kidney disease     Tobacco Use: History  Smoking status  . Former Smoker -- 0.30 packs/day for 3 years  . Types: Cigarettes  . Start date: 11/08/1964  . Quit date: 11/09/1967  Smokeless tobacco  . Never Used    Labs: Recent Review Flowsheet Data    Labs for ITP Cardiac and Pulmonary Rehab Latest Ref Rng 02/19/2014 10/15/2014 01/24/2015 01/24/2015 01/25/2015   Cholestrol 0 - 200 mg/dL 134 140 - - -   LDLCALC 0 - 99 mg/dL 55 59 - - -   HDL >40 mg/dL 59.60 67 - - -   Trlycerides <150 mg/dL 99.0 72 - - -   Hemoglobin A1c 4.8 - 5.6 % - 6.5(H) - - -   PHART 7.350 - 7.450 - - 7.358 7.276(L) 7.362   PCO2ART 35.0 - 45.0 mmHg - - 45.2(H) 56.7(H) 45.6(H)   HCO3 20.0 - 24.0 mEq/L - - 25.7(H) 26.6(H) 25.9(H)   TCO2 0 - 100 mmol/L - - '27 28 27   ' ACIDBASEDEF 0.0 - 2.0 mmol/L - - - 1.0 -   O2SAT - - - 100.0 93.0 99.0      Capillary Blood Glucose: Lab Results  Component Value Date   GLUCAP 102* 03/03/2015   GLUCAP 79 03/02/2015   GLUCAP 107* 03/01/2015   GLUCAP 110* 01/26/2015   GLUCAP 119* 01/26/2015     ADL UCSD:     Pulmonary Assessment Scores      03/27/15 1245       ADL UCSD   SOB Score total 51        Pulmonary Function Assessment:     Pulmonary Function Assessment - 03/17/15 1039    Breath   Bilateral Breath Sounds Clear   Shortness of Breath No      Exercise Target Goals:    Exercise Program Goal: Individual exercise prescription set with THRR, safety & activity barriers. Participant  demonstrates ability to understand and report RPE using BORG scale, to self-measure  pulse accurately, and to acknowledge the importance of the exercise prescription.  Exercise Prescription Goal: Starting with aerobic activity 30 plus minutes a day, 3 days per week for initial exercise prescription. Provide home exercise prescription and guidelines that participant acknowledges understanding prior to discharge.  Activity Barriers & Risk Stratification:     Activity Barriers & Cardiac Risk Stratification - 03/17/15 1036    Activity Barriers & Cardiac Risk Stratification   Activity Barriers Deconditioning;Shortness of Breath;History of Falls;Balance Concerns;Back Problems  slipped and degenerative disc disease      6 Minute Walk:     6 Minute Walk      03/20/15 1607       6 Minute Walk   Phase Initial     Distance 708 feet     Walk Time 6 minutes     # of Rest Breaks 0     MPH 1.34     METS 1.82     RPE 15     Perceived Dyspnea  2     VO2 Peak 6.37     Symptoms No     Resting HR 82 bpm     Resting BP 128/64 mmHg     Max Ex. HR 96 bpm     Max Ex. BP 134/70 mmHg     2 Minute Post BP 132/70 mmHg     Interval HR   Baseline HR 82     1 Minute HR 93     2 Minute HR 96     3 Minute HR 86     4 Minute HR 92     5 Minute HR 91     6 Minute HR 94     2 Minute Post HR 78     Interval Heart Rate? Yes     Interval Oxygen   Interval Oxygen? Yes     Baseline Oxygen Saturation % 96 %     Baseline Liters of Oxygen 0 L  Room Air     1 Minute Oxygen Saturation % 95 %     1 Minute Liters of Oxygen 0 L     2 Minute Oxygen Saturation % 96 %     2 Minute Liters of Oxygen 0 L     3 Minute Oxygen Saturation % 98 %     3 Minute Liters of Oxygen 0 L     4 Minute Oxygen Saturation % 98 %     4 Minute Liters of Oxygen 0 L     5 Minute Oxygen Saturation % 98 %     5 Minute Liters of Oxygen 0 L     6 Minute Oxygen Saturation % 99 %     6 Minute Liters of Oxygen 0 L     2 Minute Post  Oxygen Saturation % 100 %     2 Minute Post Liters of Oxygen 0 L     Pre/Post BP   Baseline BP 128/64 mmHg     6 Minute BP 134/70 mmHg     Pre/Post BP? Yes        Initial Exercise Prescription:     Initial Exercise Prescription - 03/20/15 1600    Date of Initial Exercise RX and Referring Provider   Date 03/20/15   Bike   Level 0.3   Minutes 15   METs 1.8   NuStep   Level 1   Minutes 15   METs 1.8   Track   Laps 6  Minutes 15   Prescription Details   Frequency (times per week) 2   Duration Progress to 45 minutes of aerobic exercise without signs/symptoms of physical distress   Intensity   THRR 40-80% of Max Heartrate 61-122   Ratings of Perceived Exertion 11-13   Perceived Dyspnea 0-4   Progression   Progression Continue to progress workloads to maintain intensity without signs/symptoms of physical distress.   Resistance Training   Training Prescription Yes   Weight Orange Band   Reps 10-12      Perform Capillary Blood Glucose checks as needed.  Exercise Prescription Changes:     Exercise Prescription Changes      03/25/15 1300 03/27/15 1200 04/01/15 1100 04/03/15 1200 04/08/15 1200   Exercise Review   Progression Yes  Yes No No   Response to Exercise   Blood Pressure (Admit) 92/50 mmHg  recheck post hydration 123/62 90/52 mmHg 120/64 mmHg 100/60 mmHg 116/64 mmHg   Blood Pressure (Exercise) 134/66 mmHg 106/60 mmHg 120/62 mmHg 100/60 mmHg 110/56 mmHg   Blood Pressure (Exit) 108/62 mmHg 104/73 mmHg 102/56 mmHg 104/62 mmHg 100/60 mmHg   Heart Rate (Admit) 87 bpm 83 bpm 85 bpm 88 bpm 78 bpm   Heart Rate (Exercise) 112 bpm 90 bpm 107 bpm 101 bpm 81 bpm   Heart Rate (Exit) 84 bpm 82 bpm 82 bpm 80 bpm 75 bpm   Oxygen Saturation (Admit) 95 % 99 % 95 % 97 % 99 %   Oxygen Saturation (Exercise) 98 % 98 % 97 % 98 % 96 %   Oxygen Saturation (Exit) 96 % 97 % 98 % 97 % 98 %   Rating of Perceived Exertion (Exercise) '13 17 13 13 13   ' Perceived Dyspnea (Exercise) '2 2 2 2  1   ' Symptoms none none none     Comments   Reviewed home exercise on 04/01/15     Duration Progress to 45 minutes of aerobic exercise without signs/symptoms of physical distress Progress to 45 minutes of aerobic exercise without signs/symptoms of physical distress Progress to 45 minutes of aerobic exercise without signs/symptoms of physical distress Progress to 45 minutes of aerobic exercise without signs/symptoms of physical distress Progress to 45 minutes of aerobic exercise without signs/symptoms of physical distress   Intensity Other (comment)  THR 40-80% HRR THRR unchanged THRR unchanged THRR unchanged THRR unchanged   Progression   Progression Continue to progress workloads to maintain intensity without signs/symptoms of physical distress. Continue to progress workloads to maintain intensity without signs/symptoms of physical distress. Continue to progress workloads to maintain intensity without signs/symptoms of physical distress. Continue to progress workloads to maintain intensity without signs/symptoms of physical distress. Continue to progress workloads to maintain intensity without signs/symptoms of physical distress.   Resistance Training   Training Prescription Yes Yes Yes Yes Yes   Weight Orange bands orange bands orange bands orange bands orange bands   Reps 10-12 10-12 10-12 10-12 10-12   Bike   Level 1  scifit 1 1     Minutes '15 15 15     ' NuStep   Level '2 2 3 3 3   ' Minutes '15 15 15 15 15   ' METs 1.'9 2 2 ' 2.2 2.6   Track   Laps '10  9 10 10   ' Minutes '15  15 15 15   ' Home Exercise Plan   Plans to continue exercise at   Home     Frequency   Add 1 additional day to program  exercise sessions.       04/10/15 1200 04/15/15 1200 04/17/15 1200 04/22/15 1200 04/24/15 1200   Exercise Review   Progression No Yes No Yes No   Response to Exercise   Blood Pressure (Admit) 108/58 mmHg 98/66 mmHg 106/66 mmHg 110/70 mmHg 92/52 mmHg   Blood Pressure (Exercise) 108/62 mmHg 132/64 mmHg 110/60  mmHg 122/72 mmHg 112/60 mmHg   Blood Pressure (Exit) 102/60 mmHg 100/60 mmHg 104/60 mmHg 100/56 mmHg 94/60 mmHg   Heart Rate (Admit) 77 bpm 84 bpm 91 bpm 84 bpm 85 bpm   Heart Rate (Exercise) 92 bpm 102 bpm 101 bpm 99 bpm 98 bpm   Heart Rate (Exit) 74 bpm 85 bpm 75 bpm 90 bpm 87 bpm   Oxygen Saturation (Admit) 99 % 96 % 97 % 98 % 89 %   Oxygen Saturation (Exercise) 94 % 98 % 98 % 92 % 98 %   Oxygen Saturation (Exit) 97 % 96 % 97 % 96 % 97 %   Rating of Perceived Exertion (Exercise) '13 13 13 13 11   ' Perceived Dyspnea (Exercise) '2 2 2 1 1   ' Duration Progress to 45 minutes of aerobic exercise without signs/symptoms of physical distress Progress to 45 minutes of aerobic exercise without signs/symptoms of physical distress Progress to 45 minutes of aerobic exercise without signs/symptoms of physical distress Progress to 45 minutes of aerobic exercise without signs/symptoms of physical distress Progress to 45 minutes of aerobic exercise without signs/symptoms of physical distress   Intensity THRR unchanged THRR unchanged THRR unchanged THRR unchanged THRR unchanged   Progression   Progression Continue to progress workloads to maintain intensity without signs/symptoms of physical distress. Continue to progress workloads to maintain intensity without signs/symptoms of physical distress. Continue to progress workloads to maintain intensity without signs/symptoms of physical distress. Continue to progress workloads to maintain intensity without signs/symptoms of physical distress. Continue to progress workloads to maintain intensity without signs/symptoms of physical distress.   Resistance Training   Training Prescription Yes Yes Yes Yes Yes   Weight orange bands orange bands orange bands orange bands orange bands   Reps 10-12 10-12 10-12 10-12 10-12   Oxygen   Oxygen   --  room air --  room air    Bike   Level '2 2 2 2    ' Minutes '15 15 15 15    ' NuStep   Level '3 3 3 4 4   ' Minutes '15 15 15 15 15    ' METs 2.4 2.5 2.5 2.5 2.4   Rower   Level     1   Minutes     8   Track   Laps  7  added steps to track  12    Minutes  15  15      04/29/15 1246           Exercise Review   Progression --       Response to Exercise   Blood Pressure (Admit) 120/66 mmHg       Blood Pressure (Exercise) 130/80 mmHg       Blood Pressure (Exit) 118/60 mmHg       Heart Rate (Admit) 86 bpm       Heart Rate (Exercise) 113 bpm       Heart Rate (Exit) 88 bpm       Oxygen Saturation (Admit) 95 %       Oxygen Saturation (Exercise) 95 %       Oxygen Saturation (Exit)  99 %       Rating of Perceived Exertion (Exercise) 13       Perceived Dyspnea (Exercise) 1       Duration Progress to 45 minutes of aerobic exercise without signs/symptoms of physical distress       Intensity THRR unchanged       Progression   Progression Continue to progress workloads to maintain intensity without signs/symptoms of physical distress.       Resistance Training   Training Prescription Yes       Weight orange bands       Reps 10-12       NuStep   Level 4       Minutes 15       METs 2.5       Rower   Level 1       Minutes 15       Track   Laps 13       Minutes 15          Exercise Comments:     Exercise Comments      04/07/15 1417 05/01/15 0752         Exercise Comments Doing well with exercise tolerance, will continue to follow exericse progression Patient is progressing well with exercise at home. Will continute to follow exercise prescription.         Discharge Exercise Prescription (Final Exercise Prescription Changes):     Exercise Prescription Changes - 04/29/15 1246    Exercise Review   Progression --   Response to Exercise   Blood Pressure (Admit) 120/66 mmHg   Blood Pressure (Exercise) 130/80 mmHg   Blood Pressure (Exit) 118/60 mmHg   Heart Rate (Admit) 86 bpm   Heart Rate (Exercise) 113 bpm   Heart Rate (Exit) 88 bpm   Oxygen Saturation (Admit) 95 %   Oxygen Saturation (Exercise) 95 %    Oxygen Saturation (Exit) 99 %   Rating of Perceived Exertion (Exercise) 13   Perceived Dyspnea (Exercise) 1   Duration Progress to 45 minutes of aerobic exercise without signs/symptoms of physical distress   Intensity THRR unchanged   Progression   Progression Continue to progress workloads to maintain intensity without signs/symptoms of physical distress.   Resistance Training   Training Prescription Yes   Weight orange bands   Reps 10-12   NuStep   Level 4   Minutes 15   METs 2.5   Rower   Level 1   Minutes 15   Track   Laps 13   Minutes 15       Nutrition:  Target Goals: Understanding of nutrition guidelines, daily intake of sodium <1517m, cholesterol <2042m calories 30% from fat and 7% or less from saturated fats, daily to have 5 or more servings of fruits and vegetables.  Biometrics:     Pre Biometrics - 03/17/15 1042    Pre Biometrics   Grip Strength 16 kg  left hand       Nutrition Therapy Plan and Nutrition Goals:     Nutrition Therapy & Goals - 04/08/15 1230    Nutrition Therapy   Diet Therapeutic Lifestyle Changes   Personal Nutrition Goals   Personal Goal #1 Increase fish consumption to 1-2 times/week   Personal Goal #2 Increase fruit and veggies consumed by adding a fruit and/or veggie serving to each meal.   Intervention Plan   Intervention Prescribe, educate and counsel regarding individualized specific dietary modifications aiming towards targeted core components such  as weight, hypertension, lipid management, diabetes, heart failure and other comorbidities.;Nutrition handout(s) given to patient.  High Potassium handout given per pt's request. Pt with dx of hypokalemia.   Expected Outcomes Short Term Goal: Understand basic principles of dietary content, such as calories, fat, sodium, cholesterol and nutrients.;Long Term Goal: Adherence to prescribed nutrition plan.      Nutrition Discharge: Rate Your Plate Scores:     Nutrition Assessments -  04/08/15 1225    Rate Your Plate Scores   Pre Score 50      Psychosocial: Target Goals: Acknowledge presence or absence of depression, maximize coping skills, provide positive support system. Participant is able to verbalize types and ability to use techniques and skills needed for reducing stress and depression.  Initial Review & Psychosocial Screening:     Initial Psych Review & Screening - 03/17/15 1058    Initial Review   Current issues with Current Depression;History of Depression;Current Sleep Concerns  She has trouble going to sleep    [place]? Yes   Barriers   Psychosocial barriers to participate in program The patient should benefit from training in stress management and relaxation.   Screening Interventions   Interventions Encouraged to exercise      Quality of Life Scores:     Quality of Life - 03/27/15 1229    Quality of Life Scores   Health/Function Pre 14.43 %   Socioeconomic Pre 22.63 %   Psych/Spiritual Pre 21.43 %   Family Pre 20 %   GLOBAL Pre 18.41 %      PHQ-9:     Recent Review Flowsheet Data    Depression screen [facility] 2/9 04/02/2015 03/17/2015 03/13/2015 08/09/2012 05/01/2012   Decreased Interest 0  1 0 0 3   Down, Depressed, Hopeless 0 1 0 0 0   PHQ - 2 Score 0 2 0 0 3   Altered sleeping 1 3 - 0 1   Tired, decreased energy 1 3 - 1 1   Change in appetite 0 0 - 0 0   Feeling bad or failure about yourself  0 1 - 0 0   Trouble concentrating 0 0 - 0 0   Moving slowly or fidgety/restless 0 0 - 0 0   Suicidal thoughts 0 0 - 0 0   PHQ-9 Score 2 9 - 1 5   Difficult doing work/chores Not difficult at all Not difficult at all - - -      Psychosocial Evaluation and Intervention:     Psychosocial Evaluation - 03/17/15 1100    Psychosocial Evaluation & Interventions   Interventions Relaxation education;Encouraged to exercise with the program and follow exercise prescription      Psychosocial Re-Evaluation:      Psychosocial Re-Evaluation      04/08/15 1353 05/01/15 0754         Psychosocial Re-Evaluation   Interventions Encouraged to attend Pulmonary Rehabilitation for the exercise;Stress management education;Relaxation education Encouraged to attend Pulmonary Rehabilitation for the exercise      Comments patient attended meditation and mindfulness class and verbalized importance of usage at home inorder to decrease stress and anxiety       Continued Psychosocial Services Needed Yes         Education: Education Goals: Education classes will be provided on a weekly basis, covering required topics. Participant will state understanding/return demonstration of topics presented.  Learning Barriers/Preferences:     Learning Barriers/Preferences - 03/17/15 1038    Learning  Barriers/Preferences   Learning Barriers None   Learning Preferences Audio;Verbal Instruction;Group Instruction;Individual Instruction      Education Topics: Risk Factor Reduction:  -Group instruction that is supported by a PowerPoint presentation. Instructor discusses the definition of a risk factor, different risk factors for pulmonary disease, and how the heart and lungs work together.     Nutrition for Pulmonary Patient:  -Group instruction provided by PowerPoint slides, verbal discussion, and written materials to support subject matter. The instructor gives an explanation and review of healthy diet recommendations, which includes a discussion on weight management, recommendations for fruit and vegetable consumption, as well as protein, fluid, caffeine, fiber, sodium, sugar, and alcohol. Tips for eating when patients are short of breath are discussed.          PULMONARY REHAB OTHER RESPIRATORY from 04/24/2015 in [place]   Date  03/27/15   Educator  RD   Instruction Review Code  2- meets goals/outcomes      Pursed Lip Breathing:  -Group instruction that is supported by demonstration  and informational handouts. Instructor discusses the benefits of pursed lip and diaphragmatic breathing and detailed demonstration on how to preform both.     Oxygen Safety:  -Group instruction provided by PowerPoint, verbal discussion, and written material to support subject matter. There is an overview of "What is Oxygen" and "Why do we need it".  Instructor also reviews how to create a safe environment for oxygen use, the importance of using oxygen as prescribed, and the risks of noncompliance. There is a brief discussion on traveling with oxygen and resources the patient may utilize.      PULMONARY REHAB OTHER RESPIRATORY from 04/24/2015 in [place]   Date  04/16/15   Educator  RN   Instruction Review Code  2- meets goals/outcomes      Oxygen Equipment:  -Group instruction provided by [facility] Staff utilizing handouts, written materials, and equipment demonstrations.   Signs and Symptoms:  -Group instruction provided by written material and verbal discussion to support subject matter. Warning signs and symptoms of infection, stroke, and heart attack are reviewed and when to call the physician/911 reinforced. Tips for preventing the spread of infection discussed.   Advanced Directives:  -Group instruction provided by verbal instruction and written material to support subject matter. Instructor reviews Advanced Directive laws and proper instruction for filling out document.   Pulmonary Video:  -Group video education that reviews the importance of medication and oxygen compliance, exercise, good nutrition, pulmonary hygiene, and pursed lip and diaphragmatic breathing for the pulmonary patient.   Exercise for the Pulmonary Patient:  -Group instruction that is supported by a PowerPoint presentation. Instructor discusses benefits of exercise, core components of exercise, frequency, duration, and intensity of an exercise routine, importance of utilizing  pulse oximetry during exercise, safety while exercising, and options of places to exercise outside of rehab.     Pulmonary Medications:  -Verbally interactive group education provided by instructor with focus on inhaled medications and proper administration.      PULMONARY REHAB OTHER RESPIRATORY from 04/24/2015 in [place]   Date  04/10/15   Educator  Pharm D      Anatomy and Physiology of the Respiratory System and Intimacy:  -Group instruction provided by PowerPoint, verbal discussion, and written material to support subject matter. Instructor reviews respiratory cycle and anatomical components of the respiratory system and their functions. Instructor also reviews differences in  obstructive and restrictive respiratory diseases with examples of each. Intimacy, Sex, and Sexuality differences are reviewed with a discussion on how relationships can change when diagnosed with pulmonary disease. Common sexual concerns are reviewed.   Knowledge Questionnaire Score:     Knowledge Questionnaire Score - 03/27/15 1228    Knowledge Questionnaire Score   Pre Score 11/13      Core Components/Risk Factors/Patient Goals at Admission:     Personal Goals and Risk Factors at Admission - 04/08/15 1225    Core Components/Risk Factors/Patient Goals on Admission    Weight Management Weight Loss   Intervention Weight Management: Develop a combined nutrition and exercise program designed to reach desired caloric intake, while maintaining appropriate intake of nutrient and fiber, sodium and fats, and appropriate energy expenditure required for the weight goal.;Weight Management: Provide education and appropriate resources to help participant work on and attain dietary goals.  Pt is in the contemplative state of change. Hoping adding exercise per EP exercise Rx will help her lose wt.   Admit Weight 141 lb 1.5 oz (64 kg)   Expected Outcomes Short Term: Continue to assess and  modify interventions until short term weight is achieved;Long Term: Adherence to nutrition and physical activity/exercise program aimed toward attainment of established weight goal   Diabetes --  Last A1c 6.5 10/2014. Pt needs to f/u with PCP re: further DM testing      Core Components/Risk Factors/Patient Goals Review:      Goals and Risk Factor Review      04/08/15 1358 05/01/15 0753         Core Components/Risk Factors/Patient Goals Review   Personal Goals Review Weight Management/Obesity;Sedentary;Improve shortness of breath with ADL's;Develop more efficient breathing techniques such as purse lipped breathing and diaphragmatic breathing and practicing self-pacing with activity.;Increase knowledge of respiratory medications and ability to use respiratory devices properly.;Increase Strength and Stamina;Other Increase Strength and Stamina;Improve shortness of breath with ADL's;Sedentary;Develop more efficient breathing techniques such as purse lipped breathing and diaphragmatic breathing and practicing self-pacing with activity.      Review see "comments" section on ITP see "comments" section on ITP      Expected Outcomes See Admission outcomes See Admission outcomes         Core Components/Risk Factors/Patient Goals at Discharge (Final Review):      Goals and Risk Factor Review - 05/01/15 0753    Core Components/Risk Factors/Patient Goals Review   Personal Goals Review Increase Strength and Stamina;Improve shortness of breath with ADL's;Sedentary;Develop more efficient breathing techniques such as purse lipped breathing and diaphragmatic breathing and practicing self-pacing with activity.   Review see "comments" section on ITP   Expected Outcomes See Admission outcomes      ITP Comments:   Comments: ITP REVIEW Pt is making expected progress toward personal goals after completing 11 sessions. Recommend continued exercise, life style modification, education, and utilization of  breathing techniques to increase stamina and strength and decrease shortness of breath with exertion.

## 2015-05-01 NOTE — Patient Outreach (Signed)
[facility]) Care Management  05/01/2015  [name] Sep 03, 1945 [id number]   Telephonic Monthly Assessment   Outreach call #1 to patient.  Patient not reached.  RN CM left HIPAA compliant voice message with name and call back #.  RN CM scheduled for next contact call within one week  [name], RN, BSN, [facility], [place] Management Care Management Coordinator [phone] Direct [phone] Cell [phone] Office [phone] Fax

## 2015-05-01 NOTE — Progress Notes (Signed)
Daily Session Note  Patient Details  Name: [name] MRN: [id number] Date of Birth: [birth year] Referring Provider:    Encounter Date: 05/01/2015  Check In:     Session Check In - 05/01/15 1059    Check-In   Location [facility]   Staff Present Rosebud Poles, RN, BSN;Molly diVincenzo, MS, ACSM RCEP, Exercise Physiologist;Anayely Constantine Ysidro Evert, RN;Portia Rollene Rotunda, RN, BSN   Supervising physician immediately available to respond to emergencies [facility] immediately available   Physician(s) Dr. Marily Memos   Medication changes reported     No   Fall or balance concerns reported    No   Warm-up and Cool-down Performed as group-led instruction   Resistance Training Performed Yes   VAD Patient? No   Pain Assessment   Currently in Pain? No/denies   Multiple Pain Sites No      Capillary Blood Glucose: No results found for this or any previous visit (from the past 24 hour(s)).      Exercise Prescription Changes - 05/01/15 1300    Response to Exercise   Blood Pressure (Admit) 104/60 mmHg   Blood Pressure (Exercise) 114/64 mmHg   Blood Pressure (Exit) 100/60 mmHg   Heart Rate (Admit) 95 bpm   Heart Rate (Exercise) 105 bpm   Heart Rate (Exit) 90 bpm   Oxygen Saturation (Admit) 93 %   Oxygen Saturation (Exercise) 99 %   Oxygen Saturation (Exit) 97 %   Rating of Perceived Exertion (Exercise) 13   Perceived Dyspnea (Exercise) 1   Duration Progress to 45 minutes of aerobic exercise without signs/symptoms of physical distress   Intensity THRR unchanged   Progression   Progression Continue to progress workloads to maintain intensity without signs/symptoms of physical distress.   Resistance Training   Training Prescription Yes   Weight orange bands   Reps 10-12   NuStep   Level 4   Minutes 15   METs 2.9   Track   Laps 10   Minutes 15     Goals Met:  Exercise tolerated well No report of cardiac concerns or symptoms Strength training completed today  Goals  Unmet:  Not Applicable  Comments: Service time is from 1030. to 1230    Dr. Rush Farmer is Medical Director for Pulmonary Rehab at [facility].

## 2015-05-03 DIAGNOSIS — R531 Weakness: Secondary | ICD-10-CM | POA: Diagnosis not present

## 2015-05-03 DIAGNOSIS — R0602 Shortness of breath: Secondary | ICD-10-CM | POA: Diagnosis not present

## 2015-05-03 DIAGNOSIS — J9611 Chronic respiratory failure with hypoxia: Secondary | ICD-10-CM | POA: Diagnosis not present

## 2015-05-03 DIAGNOSIS — J8410 Pulmonary fibrosis, unspecified: Secondary | ICD-10-CM | POA: Diagnosis not present

## 2015-05-04 DIAGNOSIS — R9389 Abnormal findings on diagnostic imaging of other specified body structures: Secondary | ICD-10-CM | POA: Insufficient documentation

## 2015-05-04 DIAGNOSIS — R0681 Apnea, not elsewhere classified: Secondary | ICD-10-CM | POA: Insufficient documentation

## 2015-05-05 ENCOUNTER — Other Ambulatory Visit: Payer: Self-pay

## 2015-05-05 NOTE — Patient Outreach (Signed)
[facility]) Care Management  05/05/2015  CASHAE [name] 1945/06/25 [id number]   Telephonic Monthly Assessment   Subjective:  Patient states she continues with pulmonary rehab services twice a week; has seen her Nephrologist and [place].  States she feels she has improved over the past month with pulmonary rehab services.   Providers: Primary MD: Dr. Carol Ada - last appt: 03/17/2015- Next appt: 05/15/2015 Pulmonologist: Dr. Brand Males [facility]. Dr. Lauris Chroman -appt pending pulmonary (PFT) and walking test completed April 28, 2015  - Results pending.  OP Pulmonary Rehab:initiated 3/6/207 [facility]) twice a week HH: None  Insurance: [employer]  Social: Patient lives in the home with her husband.  Mobility: Ambulating independently but has a walker if needed. Currently does not require use of walker.  Falls: 2 Last fall date 03/01/15 requiring ED visit due to hitting head - CAT scan - neg.  Pain: none Transportation: Husband Caregiver: Husband, Marylan Glore Consent: Patient agreed to [facility] services. Patient consents to discussing PHI with husband, Janelly Switalski.  DME: walker, home BP cuff, home oxygen (provider: [facility] 2 L/m via [place] at night).   HTN, Lung Disease, Chronic kidney disease, stage III ER visits: 0 / Admissions: 3  Last admission 03/01/2015 - 03/03/2015 Syncope secondary to orthostatic hypotension and illness. BP 120/72.  BP is monitored by Pulmonary Rehab services on Tuesdays and Thursdays and has remained normal.  Follow-up with Nephrologist last week 4/17; states normal results.   Medications:  Patient taking more than 10 medications  Co-pay cost issues: none ($0 - 12 dollars) Flu Vaccine: 10/09/14 PCV13: 01/15/2014 PPSV23: 11/23/2009 No medication concerns.   Objective:   Encounter Medications:  Outpatient Encounter Prescriptions as of 05/05/2015  Medication Sig  Note  . ALPRAZolam (XANAX) 0.25 MG tablet Take 0.25 mg by mouth at bedtime.   Marland Kitchen aspirin EC 81 MG tablet Take 81 mg by mouth daily.   . calcitRIOL (ROCALTROL) 0.5 MCG capsule Take 0.5 mcg by mouth daily.   . clopidogrel (PLAVIX) 75 MG tablet Take 1 tablet (75 mg total) by mouth daily with breakfast.   . diltiazem (MATZIM LA) 240 MG 24 hr tablet Take 240 mg by mouth every morning.    Marland Kitchen doxazosin (CARDURA) 2 MG tablet Take 2 mg by mouth daily. 03/17/2015: Received from: External Pharmacy Received Sig:   . furosemide (LASIX) 20 MG tablet Take 1 tablet (20 mg total) by mouth every other day.   . isosorbide mononitrate (IMDUR) 60 MG 24 hr tablet TAKE 1 AND 1/2 TABLETS BY MOUTH EVERY DAY (Patient taking differently: TAKE 1 AND 1/2 TABLETS= 90 mg BY MOUTH EVERY DAY)   . levETIRAcetam (KEPPRA) 500 MG tablet Take 1 tablet (500 mg total) by mouth 2 (two) times daily.   Marland Kitchen levothyroxine (SYNTHROID, LEVOTHROID) 100 MCG tablet Take 100 mcg by mouth daily before breakfast.   . loperamide (IMODIUM) 1 MG/5ML solution Take 1 mg by mouth as needed for diarrhea or loose stools.   Marland Kitchen losartan (COZAAR) 100 MG tablet Take 100 mg by mouth every morning.    . metoprolol succinate (TOPROL-XL) 50 MG 24 hr tablet Take 50 mg by mouth daily. Take with or immediately following a meal. 02/23/2015: .  . nitroGLYCERIN (NITROSTAT) 0.4 MG SL tablet Place 1 tablet (0.4 mg total) under the tongue every 5 (five) minutes as needed for chest pain.   Marland Kitchen ondansetron (ZOFRAN) 8 MG tablet Take 4-8 mg by mouth every 6 (six)  hours as needed for nausea or vomiting.   . pantoprazole (PROTONIX) 40 MG tablet TAKE 1 TABLET (40 MG TOTAL) BY MOUTH 2 (TWO) TIMES DAILY.   Marland Kitchen PARoxetine (PAXIL) 30 MG tablet Take 30 mg by mouth daily.   Marland Kitchen Phenylephrine-DM-GG (MUCINEX FAST-MAX CONGEST COUGH) 2.5-5-100 MG/5ML LIQD Take 20 mLs by mouth every 4 (four) hours as needed (for cough and congestion).   . potassium chloride SA (K-DUR,KLOR-CON) 20 MEQ tablet Take 1 tablet  (20 mEq total) by mouth every other day. Take with Lasix.   Marland Kitchen rosuvastatin (CRESTOR) 20 MG tablet Take 1 tablet (20 mg total) by mouth every morning.   Marland Kitchen albuterol (PROVENTIL HFA;VENTOLIN HFA) 108 (90 Base) MCG/ACT inhaler Inhale 2 puffs into the lungs every 6 (six) hours as needed for wheezing or shortness of breath. (Patient not taking: Reported on 04/09/2015) 03/01/2015: Not yet started, need to pick up from pharmacy   No facility-administered encounter medications on file as of 05/05/2015.     Fall/Depression Screening: PHQ 2/9 Scores 05/05/2015 04/02/2015 03/17/2015 03/13/2015 08/09/2012 05/01/2012  PHQ - 2 Score 0 0 2 0 0 3  PHQ- 9 Score 0 2 9 - 1 5    Fall Risk  05/05/2015 04/02/2015 03/17/2015 03/13/2015  Falls in the past year? Yes Yes Yes Yes  Number falls in past yr: 2 or more 2 or more 2 or more 2 or more  Injury with Fall? No No No Yes  Risk Factor Category  High Fall Risk High Fall Risk High Fall Risk High Fall Risk  Risk for fall due to : History of fall(s);Other (Comment) History of fall(s);Other (Comment) Other (Comment);Medication side effect History of fall(s);Impaired balance/gait  Risk for fall due to (comments): H/o orthostatic hypotension H/o orthostatic Hypotension orthostatic hypotension -  Follow up Falls evaluation completed;Education provided;Falls prevention discussed Falls evaluation completed;Education provided;Falls prevention discussed Education provided Falls evaluation completed;Education provided;Falls prevention discussed    Assessment: No new admissions over the past 60 days.  No new falls.  Improved pulmonary symptom management with pulmonary rehab.  [facility] testing results:  Pending.   Plan:  Referral Date: 02/28/15 Source: HTA Tier 4 Issue: ED and Hospital Admission utilization. DM.  (Update:  Patient does not have diabetes diagnosis). Screening 03/13/15 Telephonic RN CM services: 03/13/15 Initial Assessment:  04/02/15 Program: HTN 03/13/15  Pulmonary and  HTN Disease Management:  [facility] Education mailed 04/02/2015 (Reviewed 05/05/2015) -Diffuse Interstitial Lung Disease -Pulmonary Hypertension  -Low-Salt Diet -Know Before You Go - Your guide for where to go when you need medical care.  Patient verbalized understanding of materials and states was helpful information.   RN CM advised in next contact call within the next 30 days for telephonic monthly assessment.  RN CM advised to please notify MD of any changes in condition prior to scheduled appt's.  -shortness of breath, fever, falls, other illnesses.  RN CM provided contact name and # [phone] or main office # [phone] and 24-hour nurse line # [phone].  RN CM confirmed patient is aware of 911 services for urgent emergency needs.  THN CM Care Plan Problem One        Most Recent Value   Care Plan Problem One  High Admission utilization over the past 6 months.    Role Documenting the Problem One  Care Management Telephonic [place] for Problem One  Active   THN Long Term Goal (31-90 days)  Patient will not require any hospital admissions / ED visits to  manage her condition over the next 31-90 days.    THN Long Term Goal Start Date  04/02/15   THN Long Term Goal Met Date  -- [no admissions over the past 60 days. Goal continued. ]   Interventions for Problem One Long Term Goal  RN CM will provide education on reporting any changes in her condition to MD and seek appropriate follow-up to avoid unnecessary hosptiatl / ED utilization over the next 31-90 days.    THN CM Short Term Goal #1 (0-30 days)  Patient will require no ED or admissions for condition managment over the next 30 days.    THN CM Short Term Goal #1 Start Date  05/05/15   [facility] CM Short Term Goal #1 Met Date  05/05/15 [Patient able to state appropriate reasons for utilization. ]   Interventions for Short Term Goal #1  RN CM will teach reporting symptoms to MD as needed over the next 30 days.     THN  CM Care Plan Problem Two        Most Recent Value   Care Plan Problem Two  Lung Disease knowledge deficit.    Role Documenting the Problem Two  Care Management Telephonic Coordinator   Care Plan for Problem Two  Active   THN CM Short Term Goal #1 (0-30 days)  Patient will report via teach back improved knowledge on [facility] Condition documents over the next 30 days.    THN CM Short Term Goal #1 Start Date  05/05/15   THN CM Short Term Goal #1 Met Date   -- [Compliant and reports improvement in symptom management ]   Interventions for Short Term Goal #2   RN CM will review condition documents in further detail via teach back with patient over the next 30 days.     [facility] CM Care Plan Problem Three        Most Recent Value   Care Plan Problem Three  Specialty Referral:  [facility]   Role Documenting the Problem Three  Care Management Telephonic Coordinator   Care Plan for Problem Three  Active   THN CM Short Term Goal #1 (0-30 days)  Patient will acquire results of testing over the next 30 days.    THN CM Short Term Goal #1 Start Date  05/05/15   [facility] CM Short Term Goal #1 Met Date  05/05/15 [Appt completed. ]   Interventions for Short Term Goal #1  RN CM will encourage follow-up with [facility] to acquire results over the next month and assist as needed or as indicated.        [name], RN, BSN, [facility], CCM  [facility] [employer] Management Coordinator [phone] Direct [phone] Cell [phone] Office [phone] Fax

## 2015-05-06 ENCOUNTER — Encounter (HOSPITAL_COMMUNITY)
Admission: RE | Admit: 2015-05-06 | Discharge: 2015-05-06 | Disposition: A | Discharge status: Home / Self Care | Payer: PPO | Source: Ambulatory Visit | Attending: Internal Medicine | Admitting: Internal Medicine

## 2015-05-06 VITALS — Wt 146.6 lb

## 2015-05-06 DIAGNOSIS — J849 Interstitial pulmonary disease, unspecified: Secondary | ICD-10-CM | POA: Diagnosis not present

## 2015-05-06 NOTE — Progress Notes (Signed)
Daily Session Note  Patient Details  Name: [name] MRN: [id number] Date of Birth: 09-17-1945 Referring Provider:    Encounter Date: 05/06/2015  Check In:     Session Check In - 05/06/15 1030    Check-In   Location [facility]   Staff Present Rosebud Poles, RN, BSN;Molly diVincenzo, MS, ACSM RCEP, Exercise Physiologist;Lisa Ysidro Evert, RN;Burnetta Kohls Rollene Rotunda, RN, BSN;Joann Rion, RN, BSN   Supervising physician immediately available to respond to emergencies [facility] immediately available   Physician(s) Dr. Marily Memos   Medication changes reported     No   Fall or balance concerns reported    No   Warm-up and Cool-down Performed as group-led instruction   Resistance Training Performed Yes   VAD Patient? No   Pain Assessment   Currently in Pain? No/denies   Multiple Pain Sites No      Capillary Blood Glucose: No results found for this or any previous visit (from the past 24 hour(s)).      Exercise Prescription Changes - 05/06/15 1230    Response to Exercise   Blood Pressure (Admit) 110/62 mmHg   Blood Pressure (Exercise) 138/70 mmHg   Blood Pressure (Exit) 114/60 mmHg   Heart Rate (Admit) 78 bpm   Heart Rate (Exercise) 93 bpm   Heart Rate (Exit) 78 bpm   Oxygen Saturation (Admit) 93 %   Oxygen Saturation (Exercise) 96 %   Oxygen Saturation (Exit) 98 %   Rating of Perceived Exertion (Exercise) 13   Perceived Dyspnea (Exercise) 2   Duration Progress to 45 minutes of aerobic exercise without signs/symptoms of physical distress   Intensity THRR unchanged   Progression   Progression Continue to progress workloads to maintain intensity without signs/symptoms of physical distress.   Resistance Training   Training Prescription Yes   Weight orange bands   Reps 10-12   NuStep   Level 4   Minutes 15   Rower   Level 1   Watts 17   Minutes 15   Track   Laps 10   Minutes 15     Goals Met:  Improved SOB with ADL's Using PLB without cueing &  demonstrates good technique Exercise tolerated well No report of cardiac concerns or symptoms Strength training completed today  Goals Unmet:  Not Applicable  Comments: Service time is from 1030 to 1205   Dr. Rush Farmer is Medical Director for Pulmonary Rehab at [facility].

## 2015-05-08 ENCOUNTER — Encounter (HOSPITAL_COMMUNITY)
Admission: RE | Admit: 2015-05-08 | Discharge: 2015-05-08 | Disposition: A | Discharge status: Home / Self Care | Payer: PPO | Source: Ambulatory Visit | Attending: Internal Medicine | Admitting: Internal Medicine

## 2015-05-08 VITALS — Wt 145.5 lb

## 2015-05-08 DIAGNOSIS — J849 Interstitial pulmonary disease, unspecified: Secondary | ICD-10-CM

## 2015-05-08 NOTE — Progress Notes (Signed)
Daily Session Note  Patient Details  Name: [name] MRN: [id number] Date of Birth: [birth year] Referring Provider:    Encounter Date: 05/08/2015  Check In:     Session Check In - 05/08/15 1030    Check-In   Location [facility]   Staff Present Rosebud Poles, RN, BSN;Molly diVincenzo, MS, ACSM RCEP, Exercise Physiologist;Lisa Ysidro Evert, Felipe Drone, RN, MHA;Saif Peter Rollene Rotunda, RN, BSN   Supervising physician immediately available to respond to emergencies [facility] immediately available   Physician(s) Dr. Eliseo Squires   Medication changes reported     No   Fall or balance concerns reported    No   Warm-up and Cool-down Performed as group-led instruction   Resistance Training Performed Yes   VAD Patient? No   Pain Assessment   Currently in Pain? No/denies   Multiple Pain Sites No      Capillary Blood Glucose: No results found for this or any previous visit (from the past 24 hour(s)).      Exercise Prescription Changes - 05/08/15 1230    Response to Exercise   Blood Pressure (Admit) 114/62 mmHg   Blood Pressure (Exercise) 120/60 mmHg   Blood Pressure (Exit) 114/76 mmHg   Heart Rate (Admit) 82 bpm   Heart Rate (Exercise) 108 bpm   Heart Rate (Exit) 82 bpm   Oxygen Saturation (Admit) 95 %   Oxygen Saturation (Exercise) 96 %   Oxygen Saturation (Exit) 95 %   Rating of Perceived Exertion (Exercise) 13   Perceived Dyspnea (Exercise) 1   Duration Progress to 45 minutes of aerobic exercise without signs/symptoms of physical distress   Intensity THRR unchanged   Progression   Progression Continue to progress workloads to maintain intensity without signs/symptoms of physical distress.   Resistance Training   Training Prescription Yes   Weight orange bands   Reps 10-12   NuStep   Level 4   Minutes 15   METs 2.7   Rower   Level 1   Minutes 15   Track   Laps 15   Minutes 15     Goals Met:  Improved SOB with ADL's Using PLB without cueing &  demonstrates good technique Exercise tolerated well No report of cardiac concerns or symptoms Strength training completed today  Goals Unmet:  Not Applicable  Comments: Service time is from 1030 to 1205   Dr. Rush Farmer is Medical Director for Pulmonary Rehab at [facility].

## 2015-05-13 ENCOUNTER — Encounter (HOSPITAL_COMMUNITY)
Admission: RE | Admit: 2015-05-13 | Discharge: 2015-05-13 | Disposition: A | Discharge status: Home / Self Care | Payer: PPO | Source: Ambulatory Visit | Attending: Internal Medicine | Admitting: Internal Medicine

## 2015-05-13 VITALS — Wt 144.2 lb

## 2015-05-13 DIAGNOSIS — J849 Interstitial pulmonary disease, unspecified: Secondary | ICD-10-CM | POA: Diagnosis not present

## 2015-05-13 NOTE — Progress Notes (Signed)
Daily Session Note  Patient Details  Name: [name] MRN: [id number] Date of Birth: 12/03/1945 Referring Provider:    Encounter Date: 05/13/2015  Check In:     Session Check In - 05/13/15 1030    Check-In   Location [facility]   Staff Present Joan Behrens, RN, BSN;Molly diVincenzo, MS, ACSM RCEP, Exercise Physiologist; , RN, BSN;Olinty Richards, MS, ACSM CEP, Exercise Physiologist   Supervising physician immediately available to respond to emergencies [facility] immediately available   Physician(s) Dr. Merrell   Medication changes reported     No   Fall or balance concerns reported    No   Warm-up and Cool-down Performed as group-led instruction   Resistance Training Performed Yes   VAD Patient? No   Pain Assessment   Currently in Pain? No/denies   Multiple Pain Sites No      Capillary Blood Glucose: No results found for this or any previous visit (from the past 24 hour(s)).      Exercise Prescription Changes - 05/13/15 1236    Response to Exercise   Blood Pressure (Admit) 110/64 mmHg   Blood Pressure (Exercise) 132/70 mmHg   Blood Pressure (Exit) 104/62 mmHg   Heart Rate (Admit) 88 bpm   Heart Rate (Exercise) 110 bpm   Heart Rate (Exit) 78 bpm   Oxygen Saturation (Admit) 98 %   Oxygen Saturation (Exercise) 96 %   Oxygen Saturation (Exit) 98 %   Rating of Perceived Exertion (Exercise) 11   Perceived Dyspnea (Exercise) 1   Duration Progress to 45 minutes of aerobic exercise without signs/symptoms of physical distress   Intensity THRR unchanged   Progression   Progression Continue to progress workloads to maintain intensity without signs/symptoms of physical distress.   Resistance Training   Training Prescription Yes   Weight orange bands   Reps 10-12   NuStep   Level 4   Minutes 15   METs 2.9   Rower   Level 1   Minutes 15   Track   Laps 12   Minutes 15     Goals Met:  Improved SOB with ADL's Using PLB without  cueing & demonstrates good technique Exercise tolerated well No report of cardiac concerns or symptoms Strength training completed today  Goals Unmet:  Not Applicable  Comments: Service time is from 1030 to 1210   Dr. Wesam G. Yacoub is Medical Director for Pulmonary Rehab at [facility]. 

## 2015-05-15 ENCOUNTER — Encounter (HOSPITAL_COMMUNITY): Payer: PPO

## 2015-05-15 ENCOUNTER — Encounter (HOSPITAL_COMMUNITY)
Admission: RE | Admit: 2015-05-15 | Discharge: 2015-05-15 | Disposition: A | Discharge status: Home / Self Care | Payer: PPO | Source: Ambulatory Visit | Attending: Internal Medicine | Admitting: Internal Medicine

## 2015-05-15 VITALS — Wt 143.7 lb

## 2015-05-15 DIAGNOSIS — J849 Interstitial pulmonary disease, unspecified: Secondary | ICD-10-CM | POA: Diagnosis not present

## 2015-05-15 DIAGNOSIS — I10 Essential (primary) hypertension: Secondary | ICD-10-CM | POA: Diagnosis not present

## 2015-05-15 DIAGNOSIS — F324 Major depressive disorder, single episode, in partial remission: Secondary | ICD-10-CM | POA: Diagnosis not present

## 2015-05-15 NOTE — Progress Notes (Signed)
Daily Session Note  Patient Details  Name: DONYAE KOHN MRN: [id number] Date of Birth: 1945-03-24 Referring Provider:    Encounter Date: 05/15/2015  Check In:     Session Check In - 05/15/15 1549    Check-In   Location [facility]   Staff Present Rosebud Poles, RN, BSN;Molly diVincenzo, MS, ACSM RCEP, Exercise Physiologist;Lisa Ysidro Evert, RN;Rontavious Albright Rollene Rotunda, RN, BSN   Supervising physician immediately available to respond to emergencies [facility] immediately available   Physician(s) Dr. Waldron Labs   Medication changes reported     No   Fall or balance concerns reported    No   Warm-up and Cool-down Performed as group-led instruction   Resistance Training Performed Yes   VAD Patient? No   Pain Assessment   Currently in Pain? No/denies   Multiple Pain Sites No      Capillary Blood Glucose: No results found for this or any previous visit (from the past 24 hour(s)).      Exercise Prescription Changes - 05/15/15 1552    Exercise Review   Progression Yes   Response to Exercise   Blood Pressure (Admit) 96/50 mmHg   Blood Pressure (Exercise) 100/50 mmHg   Blood Pressure (Exit) 104/64 mmHg   Heart Rate (Admit) 78 bpm   Heart Rate (Exercise) 90 bpm   Heart Rate (Exit) 73 bpm   Oxygen Saturation (Admit) 96 %   Oxygen Saturation (Exercise) 97 %   Oxygen Saturation (Exit) 97 %   Rating of Perceived Exertion (Exercise) 11   Perceived Dyspnea (Exercise) 1   Duration Progress to 45 minutes of aerobic exercise without signs/symptoms of physical distress   Intensity THRR unchanged   Progression   Progression Continue to progress workloads to maintain intensity without signs/symptoms of physical distress.   Resistance Training   Training Prescription Yes   Weight orange bands   Reps 10-12   NuStep   Level 5   Minutes 15   METs 2.8   Rower   Level --   Minutes --   Track   Laps 12   Minutes 15     Goals Met:   Improved SOB with ADL's Exercise  tolerated well Queuing for purse lip breathing No report of cardiac concerns or symptoms Strength training completed today  Goals Unmet:  Not Applicable  Comments: Service time is from 1330 to 1530   Dr. Rush Farmer is Medical Director for Pulmonary Rehab at [facility].

## 2015-05-20 ENCOUNTER — Ambulatory Visit (INDEPENDENT_AMBULATORY_CARE_PROVIDER_SITE_OTHER): Payer: PPO | Admitting: Internal Medicine

## 2015-05-20 ENCOUNTER — Encounter: Payer: Self-pay | Admitting: Internal Medicine

## 2015-05-20 ENCOUNTER — Encounter (HOSPITAL_COMMUNITY)
Admission: RE | Admit: 2015-05-20 | Discharge: 2015-05-20 | Disposition: A | Discharge status: Home / Self Care | Payer: PPO | Source: Ambulatory Visit | Attending: Internal Medicine | Admitting: Internal Medicine

## 2015-05-20 VITALS — BP 112/68 | HR 62 | Ht 59.0 in | Wt 144.8 lb

## 2015-05-20 VITALS — Wt 145.7 lb

## 2015-05-20 DIAGNOSIS — J849 Interstitial pulmonary disease, unspecified: Secondary | ICD-10-CM

## 2015-05-20 DIAGNOSIS — R0689 Other abnormalities of breathing: Secondary | ICD-10-CM

## 2015-05-20 DIAGNOSIS — R0600 Dyspnea, unspecified: Secondary | ICD-10-CM

## 2015-05-20 MED ORDER — FLUTICASONE FUROATE-VILANTEROL 100-25 MCG/INH IN AEPB: 1.0000 | INHALATION_SPRAY | Freq: Every day | RESPIRATORY_TRACT | Status: DC

## 2015-05-20 NOTE — Patient Instructions (Signed)
ICD-9-CM ICD-10-CM   1. ILD (interstitial lung disease) ([place]) 515 J84.9   2. Dyspnea and respiratory abnormality 786.09 R06.00     R06.89    Albuterol was given to you by hospitalist Dr Devine feb 2017 - I did not prescribe this  - we do no have samples for this  - Try Breo inhaler 1 puff dialy for 2 weeks - empiric trial Overall glad you are better with rehab and [facility] visit [facility]D clinic was reassuring  I will review Dr Lauris Chroman ([place] [facility]) followup notes   Followup Call in 2 weeks with response to breo HRCT 3 months REturn to see me in 3 months   - if still winded, then consider CPST bike test

## 2015-05-20 NOTE — Progress Notes (Signed)
Daily Session Note  Patient Details  Name: MAIGEN MOZINGO MRN: [id number] Date of Birth: [birth year] Referring Provider:    Encounter Date: 05/20/2015  Check In:     Session Check In - 05/20/15 1330    Check-In   Location [facility]   Staff Present Trish Fountain, RN, BSN;Molly diVincenzo, MS, ACSM RCEP, Exercise Physiologist;Joan Leonia Reeves, RN, BSN   Supervising physician immediately available to respond to emergencies [facility] immediately available   Physician(s) Dr. Marily Memos   Medication changes reported     No   Fall or balance concerns reported    No   Warm-up and Cool-down Performed as group-led instruction   Resistance Training Performed Yes   VAD Patient? No   Pain Assessment   Currently in Pain? No/denies   Multiple Pain Sites No      Capillary Blood Glucose: No results found for this or any previous visit (from the past 24 hour(s)).      Exercise Prescription Changes - 05/20/15 1552    Response to Exercise   Blood Pressure (Admit) 112/70 mmHg   Blood Pressure (Exercise) 138/62 mmHg   Blood Pressure (Exit) 108/64 mmHg   Heart Rate (Admit) 59 bpm   Heart Rate (Exercise) 98 bpm   Heart Rate (Exit) 79 bpm   Oxygen Saturation (Admit) 96 %   Oxygen Saturation (Exercise) 94 %   Oxygen Saturation (Exit) 98 %   Rating of Perceived Exertion (Exercise) 13   Perceived Dyspnea (Exercise) 1   Duration Progress to 45 minutes of aerobic exercise without signs/symptoms of physical distress   Intensity THRR unchanged   Progression   Progression Continue to progress workloads to maintain intensity without signs/symptoms of physical distress.   Resistance Training   Training Prescription Yes   Weight orange bands   Reps 10-12   NuStep   Level 4   Minutes 15   METs 3.1   Rower   Level 1   Minutes 15   Track   Laps 15   Minutes 15     Goals Met:  Independence with exercise equipment Improved SOB with ADL's Using PLB without cueing &  demonstrates good technique Exercise tolerated well No report of cardiac concerns or symptoms Strength training completed today  Goals Unmet:  Not Applicable  Comments: Service time is from 1330 to 1515   Dr. Rush Farmer is Medical Director for Pulmonary Rehab at [facility].

## 2015-05-20 NOTE — Progress Notes (Signed)
Subjective:     Patient ID: [name], female   DOB: Oct 04, 1945, 70 y.o.   MRN: [id number]  HPI    OV 02/17/2015  Chief Complaint  Patient presents with  . Follow-up    Pt here after lung biospy. Pt c/o right lateral chest pain from the surgery, c/o chest congestion with non prod cough. Pt states her SOB is unchanged since last OV.    70 year old obese woman referred for pulm fibrosis. Hx from patient (poor hx) and chart and husband.  Office visit 11/22/2014: Dyspnea - insidious onset x several years. Progressive steadily x few years. Severe. Brought on with ltd exertion like ADLs. Relieved by rest. No associated cough or wheeze. ECHO 10/15/14 - gr1 diast dysfn and per husband recently started on lasix without improvement. Had CTA 11/9/176 - PE ruled out but per report CT shows  ILD.  Non specific scar changes on most recent CT and new RUL nodule in 2015 and 2016.   PFT 11/18/14 shows restriction with low DLCO - suggestive of ILD - FVC 70%, TLC 69%, DLCO 52% (BMI 29.67 kg/(m^2)).   Walking desat test 185 feet x 3 laps - stopped due to dyspnea prematurely at 2 laps. Also fatigue - did not desat at 2 laps.  02/17/15: Underwent right VATS, mini thoracotomy for open lung biopsy 01/24/2015 with Dr. Prescott Gum. Path with focal mild nonspecific chronic bronchiolitis and pleuritis with granulomatous features. Dyspnea improved since surgery but not back to baseline. Cough with chest congestion but unable to bring up. Pain improved from surgery. No fevers or chills. Occasional chronic chest pain. Some wheezing. She uses 2L O2 at night.   OV 05/20/2015  Chief Complaint  Patient presents with  . Follow-up    Pt states she feels her breathing has worsened since last OV. Pt c/o slight increase in SOB with activity, mild dry cough and has occassional chest discomfort - this resolves with rest.     Follow-up dyspnea in the setting of restrictive PFTs and ILD pattern and CT chest. Surgical lung biopsy  without specific ILD diagnosis  After last visit I referred her to pulmonary habitation which she says has helped her dyspnea. She did see [facility] Dr. Lauris Chroman in April 2017 and apparently has been reassured. I reviewed his notes and he didn't want to review her biopsy of the multidisciplinary thoracic ILD clinic. I do not have the conclusion of this but it appears from the patient and her husband who both are poor historians that they were reassured. They were even told that she might have some emphysema. In February 2017 she had admission for nonspecific weakness. At discharge she was given some albuterol which apparently helped. She is wanting to try the same  There are no other new issues.      has a past medical history of Irritable bowel syndrome; Hypertension; Depression; Hypothyroidism; Internal hemorrhoids; Anxiety; Heart murmur; Hyperlipidemia; Vitamin D deficiency; Chronic low back pain; Carpal tunnel syndrome; History of non-ST elevation myocardial infarction (NSTEMI); S/P drug eluting coronary stent placement; GERD (gastroesophageal reflux disease); History of hiatal hernia; Partial seizure disorder ([place]) (NEUROLOGIST-- DR Jannifer Franklin); History of supraventricular tachycardia; History of esophageal dilatation; Gross hematuria; Arthritis; Cervical spondylosis without myelopathy; S/P pericardial cyst excision; Coronary artery disease (CARDIOLOGIST-  DR Irish Lack); History of gout; PONV (postoperative nausea and vomiting); Anginal pain ([place]); Shortness of breath dyspnea; History of kidney stones; Seizures ([place]); Pulmonary fibrosis ([place]); and Chronic kidney disease.   reports that  she quit smoking about 47 years ago. Her smoking use included Cigarettes. She started smoking about 50 years ago. She has a .9 pack-year smoking history. She has never used smokeless tobacco.  Past Surgical History  Procedure Laterality Date  . Mediasternotomy N/A 02/05/2013    Procedure: MEDIAN  STERNOTOMY;  Surgeon: Ivin Poot, MD;  Location: [facility] OR;  Service: Thoracic;  Laterality: N/A;  . Biopsy of mediastinal mass N/A 02/05/2013    Procedure: RESECTION OF MEDIASTINAL MASS;  Surgeon: Ivin Poot, MD;  Location[facility] OR;  Service: Thoracic;  Laterality: N/A;  . Ectopic pregnancy surgery  YRS AGO    SALPINGECTOMY  . Coronary angiogram  03/10/2011    Procedure: CORONARY ANGIOGRAM;  Surgeon: Jettie Booze, MD;  Location: [facility] C[facility] LAB;  Service: Cardiovascular;;  . Left heart catheterization with coronary angiogram N/A 03/14/2012    Procedure: LEFT HEART CATHETERIZATION WITH CORONARY ANGIOGRAM;  Surgeon: Sueanne Margarita, MD;  Location: [facility] CATH LAB;  Service: Cardiovascular;  Laterality: N/A;  Normal LM,  50% pLAD,  70% mLAD,  D2 50-70%, very tortuous LAD,  70-82% ostial LCFX,  50% in-stent restenosis of  dRCA and mRCA  stent and 50-70% ostial PDA,  normal LVSF, ef 60%  . Percutaneous coronary stent intervention (pci-s) N/A 04/03/2012    Procedure: PERCUTANEOUS CORONARY STENT INTERVENTION (PCI-S);  Surgeon: Jettie Booze, MD;  Location: [facility] CATH LAB;  Service: Cardiovascular;  Laterality: N/A;   Successful PCI  mLAD with 2.75x12 Promus stent, postdilated to >0.27mm  . Coronary angioplasty with stent placement  11-22-2009  dr Irish Lack    Acute inferoposterior MI/  ef 60%,  PCI with DES x1 to dRCA,  25%  mLAD  . Coronary angioplasty with stent placement  06-26-2010  dr Daneen Schick    Cutting balloon angioplasty to dRCA with DES x1,  40% mLAD (non-obstructive CAD)  . Transthoracic echocardiogram  11-17-2011    grade I diastolic dysfunction/  ef 55-60%  . Cardiovascular stress test  11-22-2013  dr Irish Lack    normal lexiscan study/  no ischemia/  normal LVF and wall motion/ ef 81%  . Esophagogastroduodenoscopy (egd) with esophageal dilation  05-20-2011  . Colonoscopy  last one 2014  . Needle guided excision breast calcifications Right 08-09-2008  . Cystoscopy with retrograde  pyelogram, ureteroscopy and stent placement Right 02/13/2014    Procedure: CYSTOSCOPY WITH RETROGRADE PYELOGRAM, URETEROSCOPY AND STENT PLACEMENT;  Surgeon: Bernestine Amass, MD;  Location[facility];  Service: Urology;  Laterality: Right;  . Holmium laser application Right [id number]    Procedure: HOLMIUM LASER APPLICATION;  Surgeon: Bernestine Amass, MD;  [facility];  Service: Urology;  Laterality: Right;  . Esophagogastroduodenoscopy N/A 02/15/2014    Procedure: ESOPHAGOGASTRODUODENOSCOPY (EGD);  Surgeon: Jerene Bears, MD;  Location: [facility];  Service: Endoscopy;  Laterality: N/A;  . Lung biopsy Right 01/24/2015    Procedure: RIGHT LUNG BIOPSY;  Surgeon: Ivin Poot, MD;  Location: [place];  Service: Thoracic;  Laterality: Right;  . Video bronchoscopy N/A 01/24/2015    Procedure: RIGHT VIDEO BRONCHOSCOPY;  Surgeon: Ivin Poot, MD;  Location: [facility] OR;  Service: Thoracic;  Laterality: N/A;  . Cardiac catheterization Right 01/24/2015    Procedure: right femoral ARTERIAL LINE INSERTION;  Surgeon: Ivin Poot, MD;  Location: [place];  Service: Thoracic;  Laterality: Right;  . Thoracotomy Right 01/24/2015    Procedure: RIGHT MINI/LIMITED THORACOTOMY;  Surgeon: Ivin Poot, MD;  [facility] OR;  Service: Thoracic;  Laterality: Right;    Allergies  Allergen Reactions  . Tape Rash    ? Clear tape    Immunization History  Administered Date(s) Administered  . Influenza,inj,Quad PF,36+ Mos 09/23/2014  . Influenza-Unspecified 10/11/2012  . Pneumococcal-Unspecified 11/15/2009    Family History  Problem Relation Age of Onset  . Breast cancer Sister   . Cancer Sister     breast  . Breast cancer      niece  . Heart disease Father   . Hypertension Father   . Emphysema Father   . Coronary artery disease Father   . Diabetes Mellitus I Father   . Colon cancer Neg Hx   . Heart attack Mother   . CVA Mother      Current outpatient prescriptions:   .  ALPRAZolam (XANAX) 0.25 MG tablet, Take 0.25 mg by mouth at bedtime., Disp: , Rfl:  .  aspirin EC 81 MG tablet, Take 81 mg by mouth daily., Disp: , Rfl:  .  calcitRIOL (ROCALTROL) 0.5 MCG capsule, Take 0.5 mcg by mouth daily., Disp: , Rfl:  .  clopidogrel (PLAVIX) 75 MG tablet, Take 1 tablet (75 mg total) by mouth daily with breakfast., Disp: 30 tablet, Rfl: 10 .  diltiazem (MATZIM LA) 240 MG 24 hr tablet, Take 240 mg by mouth every morning. , Disp: , Rfl:  .  doxazosin (CARDURA) 2 MG tablet, Take 2 mg by mouth daily., Disp: , Rfl:  .  furosemide (LASIX) 20 MG tablet, Take 1 tablet (20 mg total) by mouth every other day., Disp: 15 tablet, Rfl: 6 .  isosorbide mononitrate (IMDUR) 60 MG 24 hr tablet, TAKE 1 AND 1/2 TABLETS BY MOUTH EVERY DAY (Patient taking differently: TAKE 1 AND 1/2 TABLETS= 90 mg BY MOUTH EVERY DAY), Disp: 45 tablet, Rfl: 10 .  levETIRAcetam (KEPPRA) 500 MG tablet, Take 1 tablet (500 mg total) by mouth 2 (two) times daily., Disp: 60 tablet, Rfl: 11 .  levothyroxine (SYNTHROID, LEVOTHROID) 100 MCG tablet, Take 100 mcg by mouth daily before breakfast., Disp: , Rfl:  .  losartan (COZAAR) 100 MG tablet, Take 100 mg by mouth every morning. , Disp: , Rfl:  .  metoprolol succinate (TOPROL-XL) 50 MG 24 hr tablet, Take 50 mg by mouth daily. Take with or immediately following a meal., Disp: , Rfl:  .  nitroGLYCERIN (NITROSTAT) 0.4 MG SL tablet, Place 1 tablet (0.4 mg total) under the tongue every 5 (five) minutes as needed for chest pain., Disp: 25 tablet, Rfl: 5 .  pantoprazole (PROTONIX) 40 MG tablet, TAKE 1 TABLET (40 MG TOTAL) BY MOUTH 2 (TWO) TIMES DAILY., Disp: 60 tablet, Rfl: 11 .  PARoxetine (PAXIL) 30 MG tablet, Take 30 mg by mouth daily., Disp: , Rfl:  .  potassium chloride SA (K-DUR,KLOR-CON) 20 MEQ tablet, Take 1 tablet (20 mEq total) by mouth every other day. Take with Lasix., Disp: 30 tablet, Rfl: 6 .  rosuvastatin (CRESTOR) 20 MG tablet, Take 1 tablet (20 mg total) by  mouth every morning., Disp: 90 tablet, Rfl: 3     Review of Systems     Objective:   Physical Exam   Filed Vitals:   05/20/15 1207  BP: 112/68  Pulse: 62  Height: 4\' 11"  (1.499 m)  Weight: 144 lb 12.8 oz (65.681 kg)  SpO2: 98%        Assessment:       ICD-9-CM ICD-10-CM   1. ILD (interstitial lung disease) (  [place]) 515 J84.9 CT Chest High Resolution  2. Dyspnea and respiratory abnormality 786.09 R06.00 CT Chest High Resolution    R06.89        Plan:      Albuterol was given to you by hospitalist Dr Devine feb 2017 - I did not prescribe this  - we do no have samples for this  - Try Breo inhaler 1 puff dialy for 2 weeks - empiric trial because albuterol helped and you think Dr Lauris Chroman said there is emphysema on biopsy  Overall glad you are better with rehab and [facility] was reassuring  I will review Dr Lauris Chroman ([place] [facility]) followup notes   Followup Call in 2 weeks with response to breo HRCT 3 months REturn to see me in 3 months   - if still winded, then consider CPST bike test  (> 50% of this 15 min visit spent in face to face counseling or/and coordination of care)  Dr. Brand Males, M.D., [facility].C.P Pulmonary and Critical Care Medicine Staff Physician [place] [facility] Pager: [phone], If no answer or between  15:00h - 7:00h: call [phone]  05/20/2015 12:35 PM

## 2015-05-22 ENCOUNTER — Encounter (HOSPITAL_COMMUNITY)
Admission: RE | Admit: 2015-05-22 | Discharge: 2015-05-22 | Disposition: A | Discharge status: Home / Self Care | Payer: PPO | Source: Ambulatory Visit | Attending: Internal Medicine | Admitting: Internal Medicine

## 2015-05-22 DIAGNOSIS — J849 Interstitial pulmonary disease, unspecified: Secondary | ICD-10-CM | POA: Diagnosis not present

## 2015-05-22 NOTE — Progress Notes (Signed)
Daily Session Note  Patient Details  Name: [name] MRN: [id number] Date of Birth: [birth year] Referring Provider:    Encounter Date: 05/22/2015  Check In:     Session Check In - 05/22/15 1102    Check-In   [facility]   Staff Present Rosebud Poles, RN, Levie Heritage, MA, ACSM RCEP, Exercise Physiologist;Janisse Ghan, MS, ACSM RCEP, Exercise Physiologist;Annedrea Rosezella [place], RN, [facility]   Supervising physician immediately available to respond to emergencies [facility] immediately available   Physician(s) Dr. Marily Memos   Medication changes reported     No   Fall or balance concerns reported    No   Warm-up and Cool-down Performed as group-led instruction   Resistance Training Performed Yes   VAD Patient? No   Pain Assessment   Currently in Pain? No/denies      Capillary Blood Glucose: No results found for this or any previous visit (from the past 24 hour(s)).      Exercise Prescription Changes - 05/22/15 1200    Response to Exercise   Blood Pressure (Admit) 104/60 mmHg   Blood Pressure (Exercise) 106/60 mmHg   Blood Pressure (Exit) 98/64 mmHg   Heart Rate (Admit) 64 bpm   Heart Rate (Exercise) 92 bpm   Heart Rate (Exit) 78 bpm   Oxygen Saturation (Admit) 99 %   Oxygen Saturation (Exercise) 96 %   Oxygen Saturation (Exit) 97 %   Rating of Perceived Exertion (Exercise) 13   Perceived Dyspnea (Exercise) 1   Duration Progress to 45 minutes of aerobic exercise without signs/symptoms of physical distress   Intensity THRR unchanged   Progression   Progression Continue to progress workloads to maintain intensity without signs/symptoms of physical distress.   Resistance Training   Training Prescription Yes   Weight orange bands   Reps 10-12   NuStep   Level 4   Minutes 15   METs 3   Rower   Level 1   Minutes 15     Goals Met:  Personal goals reviewed No report of cardiac concerns or symptoms Strength training completed  today  Goals Unmet:  Not Applicable  Comments: Service time is from 10:30am to 12:35pm    Dr. Rush Farmer is Medical Director for Pulmonary Rehab at [facility].

## 2015-05-27 ENCOUNTER — Encounter (HOSPITAL_COMMUNITY): Payer: PPO

## 2015-05-29 ENCOUNTER — Encounter (HOSPITAL_COMMUNITY): Payer: Self-pay

## 2015-05-29 ENCOUNTER — Encounter (HOSPITAL_COMMUNITY): Payer: PPO

## 2015-05-29 NOTE — Progress Notes (Signed)
Pulmonary Individual Treatment Plan  Patient Details  Name: [name] MRN: [id number] Date of Birth: [birth year] Referring Provider:    Initial Encounter Date:       Pulmonary Rehab Walk Test from 03/20/2015 in [place]   Date  03/20/15      Visit Diagnosis: No diagnosis found.  Patient's Home Medications on Admission:   Current outpatient prescriptions:  .  ALPRAZolam (XANAX) 0.25 MG tablet, Take 0.25 mg by mouth at bedtime., Disp: , Rfl:  .  aspirin EC 81 MG tablet, Take 81 mg by mouth daily., Disp: , Rfl:  .  calcitRIOL (ROCALTROL) 0.5 MCG capsule, Take 0.5 mcg by mouth daily., Disp: , Rfl:  .  clopidogrel (PLAVIX) 75 MG tablet, Take 1 tablet (75 mg total) by mouth daily with breakfast., Disp: 30 tablet, Rfl: 10 .  diltiazem (MATZIM LA) 240 MG 24 hr tablet, Take 240 mg by mouth every morning. , Disp: , Rfl:  .  doxazosin (CARDURA) 2 MG tablet, Take 2 mg by mouth daily., Disp: , Rfl:  .  fluticasone furoate-vilanterol (BREO ELLIPTA) 100-25 MCG/INH AEPB, Inhale 1 puff into the lungs daily., Disp: 1 each, Rfl: 0 .  furosemide (LASIX) 20 MG tablet, Take 1 tablet (20 mg total) by mouth every other day., Disp: 15 tablet, Rfl: 6 .  isosorbide mononitrate (IMDUR) 60 MG 24 hr tablet, TAKE 1 AND 1/2 TABLETS BY MOUTH EVERY DAY (Patient taking differently: TAKE 1 AND 1/2 TABLETS= 90 mg BY MOUTH EVERY DAY), Disp: 45 tablet, Rfl: 10 .  levETIRAcetam (KEPPRA) 500 MG tablet, Take 1 tablet (500 mg total) by mouth 2 (two) times daily., Disp: 60 tablet, Rfl: 11 .  levothyroxine (SYNTHROID, LEVOTHROID) 100 MCG tablet, Take 100 mcg by mouth daily before breakfast., Disp: , Rfl:  .  losartan (COZAAR) 100 MG tablet, Take 100 mg by mouth every morning. , Disp: , Rfl:  .  metoprolol succinate (TOPROL-XL) 50 MG 24 hr tablet, Take 50 mg by mouth daily. Take with or immediately following a meal., Disp: , Rfl:  .  nitroGLYCERIN (NITROSTAT) 0.4 MG SL tablet, Place 1 tablet (0.4  mg total) under the tongue every 5 (five) minutes as needed for chest pain., Disp: 25 tablet, Rfl: 5 .  pantoprazole (PROTONIX) 40 MG tablet, TAKE 1 TABLET (40 MG TOTAL) BY MOUTH 2 (TWO) TIMES DAILY., Disp: 60 tablet, Rfl: 11 .  PARoxetine (PAXIL) 30 MG tablet, Take 30 mg by mouth daily., Disp: , Rfl:  .  potassium chloride SA (K-DUR,KLOR-CON) 20 MEQ tablet, Take 1 tablet (20 mEq total) by mouth every other day. Take with Lasix., Disp: 30 tablet, Rfl: 6 .  rosuvastatin (CRESTOR) 20 MG tablet, Take 1 tablet (20 mg total) by mouth every morning., Disp: 90 tablet, Rfl: 3  Past Medical History: Past Medical History  Diagnosis Date  . Irritable bowel syndrome   . Hypertension   . Depression   . Hypothyroidism   . Internal hemorrhoids   . Anxiety   . Heart murmur   . Hyperlipidemia   . Vitamin D deficiency   . Chronic low back pain   . Carpal tunnel syndrome   . History of non-ST elevation myocardial infarction (NSTEMI)     2011--  S/P PCI WITH SENTING  . S/P drug eluting coronary stent placement     X3  in 2011, 2012, 2014  . GERD (gastroesophageal reflux disease)   . History of hiatal hernia   . Partial  seizure disorder ([place]) NEUROLOGIST-- DR WILLIS    NOCTURNAL  . History of supraventricular tachycardia     2011-- resolved  . History of esophageal dilatation     FOR STRIUCTURE  . Gross hematuria   . Arthritis   . Cervical spondylosis without myelopathy   . S/P pericardial cyst excision     02-05-2013  benign  . Coronary artery disease CARDIOLOGIST-  DR Irish Lack    hx NSTEMI inferoposterior 2011 s/p PCI  total x3 DES's  . History of gout   . PONV (postoperative nausea and vomiting)     hard time getting iv site-had to do neck stick 2 yrs ago  . Anginal pain ([place])     over yr last time  . Shortness of breath dyspnea   . History of kidney stones   . Seizures ([place])     none in yrs  . Pulmonary fibrosis ([place])   . Chronic kidney disease     Tobacco Use: History  Smoking  status  . Former Smoker -- 0.30 packs/day for 3 years  . Types: Cigarettes  . Start date: 11/08/1964  . Quit date: 11/09/1967  Smokeless tobacco  . Never Used    Labs: Recent Review Flowsheet Data    Labs for ITP Cardiac and Pulmonary Rehab Latest Ref Rng 02/19/2014 10/15/2014 01/24/2015 01/24/2015 01/25/2015   Cholestrol 0 - 200 mg/dL 134 140 - - -   LDLCALC 0 - 99 mg/dL 55 59 - - -   HDL >40 mg/dL 59.60 67 - - -   Trlycerides <150 mg/dL 99.0 72 - - -   Hemoglobin A1c 4.8 - 5.6 % - 6.5(H) - - -   PHART 7.350 - 7.450 - - 7.358 7.276(L) 7.362   PCO2ART 35.0 - 45.0 mmHg - - 45.2(H) 56.7(H) 45.6(H)   HCO3 20.0 - 24.0 mEq/L - - 25.7(H) 26.6(H) 25.9(H)   TCO2 0 - 100 mmol/L - - 27 28 27    ACIDBASEDEF 0.0 - 2.0 mmol/L - - - 1.0 -   O2SAT - - - 100.0 93.0 99.0      Capillary Blood Glucose: Lab Results  Component Value Date   GLUCAP 102* 03/03/2015   GLUCAP 79 03/02/2015   GLUCAP 107* 03/01/2015   GLUCAP 110* 01/26/2015   GLUCAP 119* 01/26/2015     ADL UCSD:   Pulmonary Function Assessment:   Exercise Target Goals:    Exercise Program Goal: Individual exercise prescription set with THRR, safety & activity barriers. Participant demonstrates ability to understand and report RPE using BORG scale, to self-measure pulse accurately, and to acknowledge the importance of the exercise prescription.  Exercise Prescription Goal: Starting with aerobic activity 30 plus minutes a day, 3 days per week for initial exercise prescription. Provide home exercise prescription and guidelines that participant acknowledges understanding prior to discharge.  Activity Barriers & Risk Stratification:   6 Minute Walk:   Initial Exercise Prescription:   Perform Capillary Blood Glucose checks as needed.  Exercise Prescription Changes:     Exercise Prescription Changes      04/01/15 1100 04/03/15 1200 04/08/15 1200 04/10/15 1200 04/15/15 1200   Exercise Review   Progression Yes No No No Yes    Response to Exercise   Blood Pressure (Admit) 120/64 mmHg 100/60 mmHg 116/64 mmHg 108/58 mmHg 98/66 mmHg   Blood Pressure (Exercise) 120/62 mmHg 100/60 mmHg 110/56 mmHg 108/62 mmHg 132/64 mmHg   Blood Pressure (Exit) 102/56 mmHg 104/62 mmHg 100/60 mmHg 102/60 mmHg 100/60 mmHg  Heart Rate (Admit) 85 bpm 88 bpm 78 bpm 77 bpm 84 bpm   Heart Rate (Exercise) 107 bpm 101 bpm 81 bpm 92 bpm 102 bpm   Heart Rate (Exit) 82 bpm 80 bpm 75 bpm 74 bpm 85 bpm   Oxygen Saturation (Admit) 95 % 97 % 99 % 99 % 96 %   Oxygen Saturation (Exercise) 97 % 98 % 96 % 94 % 98 %   Oxygen Saturation (Exit) 98 % 97 % 98 % 97 % 96 %   Rating of Perceived Exertion (Exercise) 13 13 13 13 13    Perceived Dyspnea (Exercise) 2 2 1 2 2    Symptoms none       Comments Reviewed home exercise on 04/01/15       Duration Progress to 45 minutes of aerobic exercise without signs/symptoms of physical distress Progress to 45 minutes of aerobic exercise without signs/symptoms of physical distress Progress to 45 minutes of aerobic exercise without signs/symptoms of physical distress Progress to 45 minutes of aerobic exercise without signs/symptoms of physical distress Progress to 45 minutes of aerobic exercise without signs/symptoms of physical distress   Intensity THRR unchanged THRR unchanged THRR unchanged THRR unchanged THRR unchanged   Progression   Progression Continue to progress workloads to maintain intensity without signs/symptoms of physical distress. Continue to progress workloads to maintain intensity without signs/symptoms of physical distress. Continue to progress workloads to maintain intensity without signs/symptoms of physical distress. Continue to progress workloads to maintain intensity without signs/symptoms of physical distress. Continue to progress workloads to maintain intensity without signs/symptoms of physical distress.   Resistance Training   Training Prescription Yes Yes Yes Yes Yes   Weight orange bands orange  bands orange bands orange bands orange bands   Reps 10-12 10-12 10-12 10-12 10-12   Bike   Level 1   2 2    Minutes 15   15 15    NuStep   Level 3 3 3 3 3    Minutes 15 15 15 15 15    METs 2 2.2 2.6 2.4 2.5   Track   Laps 9 10 10  7   added steps to track   Minutes 15 15 15  15    Home Exercise Plan   Plans to continue exercise at Home       Frequency Add 1 additional day to program exercise sessions.         04/17/15 1200 04/22/15 1200 04/24/15 1200 04/29/15 1246 05/01/15 1300   Exercise Review   Progression No Yes No --    Response to Exercise   Blood Pressure (Admit) 106/66 mmHg 110/70 mmHg 92/52 mmHg 120/66 mmHg 104/60 mmHg   Blood Pressure (Exercise) 110/60 mmHg 122/72 mmHg 112/60 mmHg 130/80 mmHg 114/64 mmHg   Blood Pressure (Exit) 104/60 mmHg 100/56 mmHg 94/60 mmHg 118/60 mmHg 100/60 mmHg   Heart Rate (Admit) 91 bpm 84 bpm 85 bpm 86 bpm 95 bpm   Heart Rate (Exercise) 101 bpm 99 bpm 98 bpm 113 bpm 105 bpm   Heart Rate (Exit) 75 bpm 90 bpm 87 bpm 88 bpm 90 bpm   Oxygen Saturation (Admit) 97 % 98 % 89 % 95 % 93 %   Oxygen Saturation (Exercise) 98 % 92 % 98 % 95 % 99 %   Oxygen Saturation (Exit) 97 % 96 % 97 % 99 % 97 %   Rating of Perceived Exertion (Exercise) 13 13 11 13 13    Perceived Dyspnea (Exercise) 2 1 1 1 1    Duration  Progress to 45 minutes of aerobic exercise without signs/symptoms of physical distress Progress to 45 minutes of aerobic exercise without signs/symptoms of physical distress Progress to 45 minutes of aerobic exercise without signs/symptoms of physical distress Progress to 45 minutes of aerobic exercise without signs/symptoms of physical distress Progress to 45 minutes of aerobic exercise without signs/symptoms of physical distress   Intensity THRR unchanged THRR unchanged THRR unchanged THRR unchanged THRR unchanged   Progression   Progression Continue to progress workloads to maintain intensity without signs/symptoms of physical distress. Continue to progress  workloads to maintain intensity without signs/symptoms of physical distress. Continue to progress workloads to maintain intensity without signs/symptoms of physical distress. Continue to progress workloads to maintain intensity without signs/symptoms of physical distress. Continue to progress workloads to maintain intensity without signs/symptoms of physical distress.   Resistance Training   Training Prescription Yes Yes Yes Yes Yes   Weight orange bands orange bands orange bands orange bands orange bands   Reps 10-12 10-12 10-12 10-12 10-12   Oxygen   Oxygen --  room air --  room air      Bike   Level 2 2      Minutes 15 15      NuStep   Level 3 4 4 4 4    Minutes 15 15 15 15 15    METs 2.5 2.5 2.4 2.5 2.9   Rower   Level   1 1    Minutes   8 15    Track   Laps  12  13 10    Minutes  15  15 15      05/06/15 1230 05/08/15 1230 05/13/15 1236 05/15/15 1552 05/20/15 1552   Exercise Review   Progression    Yes    Response to Exercise   Blood Pressure (Admit) 110/62 mmHg 114/62 mmHg 110/64 mmHg 96/50 mmHg 112/70 mmHg   Blood Pressure (Exercise) 138/70 mmHg 120/60 mmHg 132/70 mmHg 100/50 mmHg 138/62 mmHg   Blood Pressure (Exit) 114/60 mmHg 114/76 mmHg 104/62 mmHg 104/64 mmHg 108/64 mmHg   Heart Rate (Admit) 78 bpm 82 bpm 88 bpm 78 bpm 59 bpm   Heart Rate (Exercise) 93 bpm 108 bpm 110 bpm 90 bpm 98 bpm   Heart Rate (Exit) 78 bpm 82 bpm 78 bpm 73 bpm 79 bpm   Oxygen Saturation (Admit) 93 % 95 % 98 % 96 % 96 %   Oxygen Saturation (Exercise) 96 % 96 % 96 % 97 % 94 %   Oxygen Saturation (Exit) 98 % 95 % 98 % 97 % 98 %   Rating of Perceived Exertion (Exercise) 13 13 11 11 13    Perceived Dyspnea (Exercise) 2 1 1 1 1    Duration Progress to 45 minutes of aerobic exercise without signs/symptoms of physical distress Progress to 45 minutes of aerobic exercise without signs/symptoms of physical distress Progress to 45 minutes of aerobic exercise without signs/symptoms of physical distress Progress to  45 minutes of aerobic exercise without signs/symptoms of physical distress Progress to 45 minutes of aerobic exercise without signs/symptoms of physical distress   Intensity THRR unchanged THRR unchanged THRR unchanged THRR unchanged THRR unchanged   Progression   Progression Continue to progress workloads to maintain intensity without signs/symptoms of physical distress. Continue to progress workloads to maintain intensity without signs/symptoms of physical distress. Continue to progress workloads to maintain intensity without signs/symptoms of physical distress. Continue to progress workloads to maintain intensity without signs/symptoms of physical distress. Continue to progress workloads to maintain  intensity without signs/symptoms of physical distress.   Resistance Training   Training Prescription Yes Yes Yes Yes Yes   Weight orange bands orange bands orange bands orange bands orange bands   Reps 10-12 10-12 10-12 10-12 10-12   NuStep   Level 4 4 4 5 4    Minutes 15 15 15 15 15    METs  2.7 2.9 2.8 3.1   Rower   Level 1 1 1  -- 1   [place] 17       Minutes 15 15 15  -- 15   Track   Laps 10 15 12 12 15    Minutes 15 15 15 15 15      05/22/15 1200           Response to Exercise   Blood Pressure (Admit) 104/60 mmHg       Blood Pressure (Exercise) 106/60 mmHg       Blood Pressure (Exit) 98/64 mmHg       Heart Rate (Admit) 64 bpm       Heart Rate (Exercise) 92 bpm       Heart Rate (Exit) 78 bpm       Oxygen Saturation (Admit) 99 %       Oxygen Saturation (Exercise) 96 %       Oxygen Saturation (Exit) 97 %       Rating of Perceived Exertion (Exercise) 13       Perceived Dyspnea (Exercise) 1       Duration Progress to 45 minutes of aerobic exercise without signs/symptoms of physical distress       Intensity THRR unchanged       Progression   Progression Continue to progress workloads to maintain intensity without signs/symptoms of physical distress.       Resistance Training   Training  Prescription Yes       Weight orange bands       Reps 10-12       NuStep   Level 4       Minutes 15       METs 3       Rower   Level 1       Minutes 15          Exercise Comments:     Exercise Comments      04/07/15 1417 05/01/15 0752 05/29/15 0901       Exercise Comments Doing well with exercise tolerance, will continue to follow exericse progression Patient is progressing well with exercise at home. Will continute to follow exercise prescription. Patient is progressing well with exercise at rehab and at home. Patient is increasing intensity on the Nustep and the track. Patient feels that she is improving. Will continue to monitor and progress as appropriate.        Discharge Exercise Prescription (Final Exercise Prescription Changes):     Exercise Prescription Changes - 05/22/15 1200    Response to Exercise   Blood Pressure (Admit) 104/60 mmHg   Blood Pressure (Exercise) 106/60 mmHg   Blood Pressure (Exit) 98/64 mmHg   Heart Rate (Admit) 64 bpm   Heart Rate (Exercise) 92 bpm   Heart Rate (Exit) 78 bpm   Oxygen Saturation (Admit) 99 %   Oxygen Saturation (Exercise) 96 %   Oxygen Saturation (Exit) 97 %   Rating of Perceived Exertion (Exercise) 13   Perceived Dyspnea (Exercise) 1   Duration Progress to 45 minutes of aerobic exercise without signs/symptoms of physical distress   Intensity THRR unchanged  Progression   Progression Continue to progress workloads to maintain intensity without signs/symptoms of physical distress.   Resistance Training   Training Prescription Yes   Weight orange bands   Reps 10-12   NuStep   Level 4   Minutes 15   METs 3   Rower   Level 1   Minutes 15       Nutrition:  Target Goals: Understanding of nutrition guidelines, daily intake of sodium 1500mg , cholesterol 200mg , calories 30% from fat and 7% or less from saturated fats, daily to have 5 or more servings of fruits and vegetables.  Biometrics:    Nutrition Therapy  Plan and Nutrition Goals:     Nutrition Therapy & Goals - 04/08/15 1230    Nutrition Therapy   Diet Therapeutic Lifestyle Changes   Personal Nutrition Goals   Personal Goal #1 Increase fish consumption to 1-2 times/week   Personal Goal #2 Increase fruit and veggies consumed by adding a fruit and/or veggie serving to each meal.   Intervention Plan   Intervention Prescribe, educate and counsel regarding individualized specific dietary modifications aiming towards targeted core components such as weight, hypertension, lipid management, diabetes, heart failure and other comorbidities.;Nutrition handout(s) given to patient.  High Potassium handout given per pt's request. Pt with dx of hypokalemia.   Expected Outcomes Short Term Goal: Understand basic principles of dietary content, such as calories, fat, sodium, cholesterol and nutrients.;Long Term Goal: Adherence to prescribed nutrition plan.      Nutrition Discharge: Rate Your Plate Scores:     Nutrition Assessments - 04/08/15 1225    Rate Your Plate Scores   Pre Score 50      Psychosocial: Target Goals: Acknowledge presence or absence of depression, maximize coping skills, provide positive support system. Participant is able to verbalize types and ability to use techniques and skills needed for reducing stress and depression.  Initial Review & Psychosocial Screening:   Quality of Life Scores:   PHQ-9:     Recent Review Flowsheet Data    Depression screen [facility] 2/9 05/05/2015 04/02/2015 03/17/2015 03/13/2015 08/09/2012   Decreased Interest 0 0  1 0 0   Down, Depressed, Hopeless 0 0 1 0 0   PHQ - 2 Score 0 0 2 0 0   Altered sleeping 0 1 3 - 0   Tired, decreased energy 0 1 3 - 1   Change in appetite 0 0 0 - 0   Feeling bad or failure about yourself  0 0 1 - 0   Trouble concentrating 0 0 0 - 0   Moving slowly or fidgety/restless 0 0 0 - 0   Suicidal thoughts 0 0 0 - 0   PHQ-9 Score 0 2 9 - 1   Difficult doing work/chores Not  difficult at all Not difficult at all Not difficult at all - -      Psychosocial Evaluation and Intervention:   Psychosocial Re-Evaluation:     Psychosocial Re-Evaluation      04/08/15 1353 05/01/15 0754 05/29/15 0957       Psychosocial Re-Evaluation   Interventions Encouraged to attend Pulmonary Rehabilitation for the exercise;Stress management education;Relaxation education Encouraged to attend Pulmonary Rehabilitation for the exercise Encouraged to attend Pulmonary Rehabilitation for the exercise     Comments patient attended meditation and mindfulness class and verbalized importance of usage at home inorder to decrease stress and anxiety  no psycosocial barriers identified     Continued Psychosocial Services Needed Yes  Education: Education Goals: Education classes will be provided on a weekly basis, covering required topics. Participant will state understanding/return demonstration of topics presented.  Learning Barriers/Preferences:   Education Topics: Risk Factor Reduction:  -Group instruction that is supported by a PowerPoint presentation. Instructor discusses the definition of a risk factor, different risk factors for pulmonary disease, and how the heart and lungs work together.     Nutrition for Pulmonary Patient:  -Group instruction provided by PowerPoint slides, verbal discussion, and written materials to support subject matter. The instructor gives an explanation and review of healthy diet recommendations, which includes a discussion on weight management, recommendations for fruit and vegetable consumption, as well as protein, fluid, caffeine, fiber, sodium, sugar, and alcohol. Tips for eating when patients are short of breath are discussed.          PULMONARY REHAB OTHER RESPIRATORY from 05/22/2015 in [place]   Date  03/27/15   Educator  RD   Instruction Review Code  2- meets goals/outcomes      Pursed Lip Breathing:   -Group instruction that is supported by demonstration and informational handouts. Instructor discusses the benefits of pursed lip and diaphragmatic breathing and detailed demonstration on how to preform both.     Oxygen Safety:  -Group instruction provided by PowerPoint, verbal discussion, and written material to support subject matter. There is an overview of "What is Oxygen" and "Why do we need it".  Instructor also reviews how to create a safe environment for oxygen use, the importance of using oxygen as prescribed, and the risks of noncompliance. There is a brief discussion on traveling with oxygen and resources the patient may utilize.      PULMONARY REHAB OTHER RESPIRATORY from 05/22/2015 in [place]   Date  04/16/15   Educator  RN   Instruction Review Code  2- meets goals/outcomes      Oxygen Equipment:  -Group instruction provided by [facility] Staff utilizing handouts, written materials, and equipment demonstrations.      PULMONARY REHAB OTHER RESPIRATORY from 05/22/2015 in [place]   Date  05/01/15   Educator  [employer] rep   Instruction Review Code  2- meets goals/outcomes      Signs and Symptoms:  -Group instruction provided by written material and verbal discussion to support subject matter. Warning signs and symptoms of infection, stroke, and heart attack are reviewed and when to call the physician/911 reinforced. Tips for preventing the spread of infection discussed.   Advanced Directives:  -Group instruction provided by verbal instruction and written material to support subject matter. Instructor reviews Advanced Directive laws and proper instruction for filling out document.   Pulmonary Video:  -Group video education that reviews the importance of medication and oxygen compliance, exercise, good nutrition, pulmonary hygiene, and pursed lip and diaphragmatic breathing for the pulmonary  patient.   Exercise for the Pulmonary Patient:  -Group instruction that is supported by a PowerPoint presentation. Instructor discusses benefits of exercise, core components of exercise, frequency, duration, and intensity of an exercise routine, importance of utilizing pulse oximetry during exercise, safety while exercising, and options of places to exercise outside of rehab.        PULMONARY REHAB OTHER RESPIRATORY from 05/22/2015 in [place]   Date  05/15/15   Educator  EP   Instruction Review Code  2- meets goals/outcomes      Pulmonary Medications:  -Verbally interactive  group education provided by instructor with focus on inhaled medications and proper administration.      PULMONARY REHAB OTHER RESPIRATORY from 05/22/2015 in [place]   Date  04/10/15   Educator  Pharm D      Anatomy and Physiology of the Respiratory System and Intimacy:  -Group instruction provided by PowerPoint, verbal discussion, and written material to support subject matter. Instructor reviews respiratory cycle and anatomical components of the respiratory system and their functions. Instructor also reviews differences in obstructive and restrictive respiratory diseases with examples of each. Intimacy, Sex, and Sexuality differences are reviewed with a discussion on how relationships can change when diagnosed with pulmonary disease. Common sexual concerns are reviewed.      PULMONARY REHAB OTHER RESPIRATORY from 05/22/2015 in [place]   Date  05/22/15   Educator  Trish Fountain   Instruction Review Code  2- meets goals/outcomes      Knowledge Questionnaire Score:   Core Components/Risk Factors/Patient Goals at Admission:     Personal Goals and Risk Factors at Admission - 04/08/15 1225    Core Components/Risk Factors/Patient Goals on Admission    Weight Management Weight Loss   Intervention Weight Management:  Develop a combined nutrition and exercise program designed to reach desired caloric intake, while maintaining appropriate intake of nutrient and fiber, sodium and fats, and appropriate energy expenditure required for the weight goal.;Weight Management: Provide education and appropriate resources to help participant work on and attain dietary goals.  Pt is in the contemplative state of change. Hoping adding exercise per EP exercise Rx will help her lose wt.   Admit Weight 141 lb 1.5 oz (64 kg)   Expected Outcomes Short Term: Continue to assess and modify interventions until short term weight is achieved;Long Term: Adherence to nutrition and physical activity/exercise program aimed toward attainment of established weight goal   Diabetes --  Last A1c 6.5 10/2014. Pt needs to f/u with PCP re: further DM testing      Core Components/Risk Factors/Patient Goals Review:      Goals and Risk Factor Review      04/08/15 1358 05/01/15 0753 05/29/15 0957       Core Components/Risk Factors/Patient Goals Review   Personal Goals Review Weight Management/Obesity;Sedentary;Improve shortness of breath with ADL's;Develop more efficient breathing techniques such as purse lipped breathing and diaphragmatic breathing and practicing self-pacing with activity.;Increase knowledge of respiratory medications and ability to use respiratory devices properly.;Increase Strength and Stamina;Other Increase Strength and Stamina;Improve shortness of breath with ADL's;Sedentary;Develop more efficient breathing techniques such as purse lipped breathing and diaphragmatic breathing and practicing self-pacing with activity. Increase Strength and Stamina;Improve shortness of breath with ADL's;Sedentary;Develop more efficient breathing techniques such as purse lipped breathing and diaphragmatic breathing and practicing self-pacing with activity.     Review see "comments" section on ITP see "comments" section on ITP see "comments" section on  ITP     Expected Outcomes See Admission outcomes See Admission outcomes See Admission outcomes        Core Components/Risk Factors/Patient Goals at Discharge (Final Review):      Goals and Risk Factor Review - 05/29/15 0957    Core Components/Risk Factors/Patient Goals Review   Personal Goals Review Increase Strength and Stamina;Improve shortness of breath with ADL's;Sedentary;Develop more efficient breathing techniques such as purse lipped breathing and diaphragmatic breathing and practicing self-pacing with activity.   Review see "comments" section on ITP   Expected Outcomes See Admission outcomes  ITP Comments:   Comments: ITP REVIEW Pt is making expected progress toward personal goals after completing 18 sessions. She continues to tolerate increases in workloads on the exercise equipment. Recommend continued exercise, life style modification, education, and utilization of breathing techniques to increase stamina and strength and decrease shortness of breath with exertion.

## 2015-06-02 ENCOUNTER — Ambulatory Visit: Payer: Self-pay

## 2015-06-02 DIAGNOSIS — J9611 Chronic respiratory failure with hypoxia: Secondary | ICD-10-CM | POA: Diagnosis not present

## 2015-06-02 DIAGNOSIS — R531 Weakness: Secondary | ICD-10-CM | POA: Diagnosis not present

## 2015-06-02 DIAGNOSIS — J8410 Pulmonary fibrosis, unspecified: Secondary | ICD-10-CM | POA: Diagnosis not present

## 2015-06-02 DIAGNOSIS — R0602 Shortness of breath: Secondary | ICD-10-CM | POA: Diagnosis not present

## 2015-06-03 ENCOUNTER — Encounter (HOSPITAL_COMMUNITY)
Admission: RE | Admit: 2015-06-03 | Discharge: 2015-06-03 | Disposition: A | Discharge status: Home / Self Care | Payer: PPO | Source: Ambulatory Visit | Attending: Internal Medicine | Admitting: Internal Medicine

## 2015-06-03 ENCOUNTER — Ambulatory Visit: Payer: Self-pay

## 2015-06-03 VITALS — Wt 149.5 lb

## 2015-06-03 DIAGNOSIS — J849 Interstitial pulmonary disease, unspecified: Secondary | ICD-10-CM

## 2015-06-03 NOTE — Progress Notes (Signed)
Daily Session Note  Patient Details  Name: [name] MRN: [id number] Date of Birth: 1945-08-16 Referring Provider:    Encounter Date: 06/03/2015  Check In:     Session Check In - 06/03/15 1235    Check-In   [facility]   Staff Present Rodney Langton, RN;Portia Rollene Rotunda, RN, BSN;Molly diVincenzo, MS, ACSM RCEP, Exercise Physiologist   Supervising physician immediately available to respond to emergencies [facility] immediately available   Physician(s) Dr. Marily Memos   Medication changes reported     No   Fall or balance concerns reported    No   Warm-up and Cool-down Performed as group-led instruction   Resistance Training Performed Yes   VAD Patient? No   Pain Assessment   Currently in Pain? No/denies   Multiple Pain Sites No      Capillary Blood Glucose: No results found for this or any previous visit (from the past 24 hour(s)).      Exercise Prescription Changes - 06/03/15 1200    Response to Exercise   Blood Pressure (Admit) 102/62 mmHg   Blood Pressure (Exercise) 126/84 mmHg   Blood Pressure (Exit) 104/60 mmHg   Heart Rate (Admit) 84 bpm   Heart Rate (Exercise) 93 bpm   Heart Rate (Exit) 75 bpm   Oxygen Saturation (Admit) 95 %   Oxygen Saturation (Exercise) 96 %   Oxygen Saturation (Exit) 95 %   Rating of Perceived Exertion (Exercise) 11   Perceived Dyspnea (Exercise) 1   Duration Progress to 45 minutes of aerobic exercise without signs/symptoms of physical distress   Intensity THRR unchanged   Progression   Progression Continue to progress workloads to maintain intensity without signs/symptoms of physical distress.   Resistance Training   Training Prescription Yes   Weight orange bands   Reps 10-12   NuStep   Level 4   Minutes 15   METs 2.4   Rower   Level 1   Minutes 15   Track   Laps 10   Minutes 15     Goals Met:  Exercise tolerated well No report of cardiac concerns or symptoms Strength training completed  today  Goals Unmet:  Not Applicable  Comments: Service time is from 1030 to 1210    Dr. Rush Farmer is Medical Director for Pulmonary Rehab at [facility].

## 2015-06-04 ENCOUNTER — Ambulatory Visit: Payer: Self-pay

## 2015-06-05 ENCOUNTER — Ambulatory Visit (INDEPENDENT_AMBULATORY_CARE_PROVIDER_SITE_OTHER): Payer: PPO | Admitting: Cardiology

## 2015-06-05 ENCOUNTER — Encounter (HOSPITAL_COMMUNITY)
Admission: RE | Admit: 2015-06-05 | Discharge: 2015-06-05 | Disposition: A | Discharge status: Home / Self Care | Payer: PPO | Source: Ambulatory Visit | Attending: Internal Medicine | Admitting: Internal Medicine

## 2015-06-05 ENCOUNTER — Encounter: Payer: Self-pay | Admitting: Cardiology

## 2015-06-05 ENCOUNTER — Ambulatory Visit: Payer: Self-pay

## 2015-06-05 VITALS — Wt 146.2 lb

## 2015-06-05 VITALS — BP 118/74 | HR 80 | Ht 59.0 in | Wt 147.0 lb

## 2015-06-05 DIAGNOSIS — E785 Hyperlipidemia, unspecified: Secondary | ICD-10-CM

## 2015-06-05 DIAGNOSIS — I2510 Atherosclerotic heart disease of native coronary artery without angina pectoris: Secondary | ICD-10-CM

## 2015-06-05 DIAGNOSIS — I471 Supraventricular tachycardia: Secondary | ICD-10-CM | POA: Diagnosis not present

## 2015-06-05 DIAGNOSIS — Q248 Other specified congenital malformations of heart: Secondary | ICD-10-CM

## 2015-06-05 DIAGNOSIS — J849 Interstitial pulmonary disease, unspecified: Secondary | ICD-10-CM | POA: Diagnosis not present

## 2015-06-05 DIAGNOSIS — Q249 Congenital malformation of heart, unspecified: Secondary | ICD-10-CM

## 2015-06-05 DIAGNOSIS — I10 Essential (primary) hypertension: Secondary | ICD-10-CM | POA: Diagnosis not present

## 2015-06-05 NOTE — Progress Notes (Signed)
Cardiology Office Note    Date:  06/05/2015   ID:  [name], DOB Oct 26, 1945, MRN [id number]  PCP:  Reginia Naas, MD  Cardiologist:  Fransico Him, MD   Chief Complaint  Patient presents with  . Coronary Artery Disease  . Hypertension  . Hyperlipidemia    History of Present Illness:  [name] is a 70 y.o. female with a history of CAD/HTN, pericardial cyst s/p resection and dyslipididemia who presents today for followup.  She has a history of chronic CP with esophageal strictures and has had esophageal dilatation.Since I saw her last she has been seeing Dr. Chase Caller and underwent VATS procedure via mini thoracotomy syndrome with open lung bx and was diagnosed with interstitial lung disease.  She has chronic CP that has been felt to be noncardiac in the past and is stable.  She has chronic DOE and thinks she is better after going through pulmonary rehab.   She denies any LE edema palpitations. She has had some dizziness due to orthostatic hypotension but that has improved.     Past Medical History  Diagnosis Date  . Irritable bowel syndrome   . Hypertension   . Depression   . Hypothyroidism   . Internal hemorrhoids   . Anxiety   . Heart murmur   . Hyperlipidemia   . Vitamin D deficiency   . Chronic low back pain   . Carpal tunnel syndrome   . History of non-ST elevation myocardial infarction (NSTEMI)     2011--  S/P PCI WITH SENTING  . S/P drug eluting coronary stent placement     X3  in 2011, 2012, 2014  . GERD (gastroesophageal reflux disease)   . History of hiatal hernia   . Partial seizure disorder (HCC) NEUROLOGIST-- DR WILLIS    NOCTURNAL  . History of supraventricular tachycardia     2011-- resolved  . History of esophageal dilatation     FOR STRIUCTURE  . Gross hematuria   . Arthritis   . Cervical spondylosis without myelopathy   . S/P pericardial cyst excision     02-05-2013  benign  . Coronary artery disease CARDIOLOGIST-  DR Irish Lack      hx NSTEMI inferoposterior 2011 s/p PCI  total x3 DES's  . History of gout   . PONV (postoperative nausea and vomiting)     hard time getting iv site-had to do neck stick 2 yrs ago  . Anginal pain ([place])     over yr last time  . Shortness of breath dyspnea   . History of kidney stones   . Seizures ([place])     none in yrs  . Pulmonary fibrosis ([place])   . Chronic kidney disease     Past Surgical History  Procedure Laterality Date  . Mediasternotomy N/A 02/05/2013    Procedure: MEDIAN STERNOTOMY;  Surgeon: Ivin Poot, MD;  Location: [facility] OR;  Service: Thoracic;  Laterality: N/A;  . Biopsy of mediastinal mass N/A 02/05/2013    Procedure: RESECTION OF MEDIASTINAL MASS;  Surgeon: Ivin Poot, MD;  Location: [facility] OR;  Service: Thoracic;  Laterality: N/A;  . Ectopic pregnancy surgery  YRS AGO    SALPINGECTOMY  . Coronary angiogram  03/10/2011    Procedure: CORONARY ANGIOGRAM;  Surgeon: Jettie Booze, MD;  Location: [facility] CATH LAB;  Service: Cardiovascular;;  . Left heart catheterization with coronary angiogram N/A 03/14/2012    Procedure: LEFT HEART CATHETERIZATION WITH CORONARY ANGIOGRAM;  Surgeon: Sueanne Margarita,  MD;  Location: [facility] CATH LAB;  Service: Cardiovascular;  Laterality: N/A;  Normal LM,  50% pLAD,  70% mLAD,  D2 50-70%, very tortuous LAD,  70-82% ostial LCFX,  50% in-stent restenosis of  dRCA and mRCA  stent and 50-70% ostial PDA,  normal LVSF, ef 60%  . Percutaneous coronary stent intervention (pci-s) N/A 04/03/2012    Procedure: PERCUTANEOUS CORONARY STENT INTERVENTION (PCI-S);  Surgeon: Jettie Booze, MD;  Location: [facility] CATH LAB;  Service: Cardiovascular;  Laterality: N/A;   Successful PCI  mLAD with 2.75x12 Promus stent, postdilated to >0.82mm  . Coronary angioplasty with stent placement  11-22-2009  dr Irish Lack    Acute inferoposterior MI/  ef 60%,  PCI with DES x1 to dRCA,  25%  mLAD  . Coronary angioplasty with stent placement  06-26-2010  dr Daneen Schick    Cutting  balloon angioplasty to dRCA with DES x1,  40% mLAD (non-obstructive CAD)  . Transthoracic echocardiogram  11-17-2011    grade I diastolic dysfunction/  ef 55-60%  . Cardiovascular stress test  11-22-2013  dr Irish Lack    normal lexiscan study/  no ischemia/  normal LVF and wall motion/ ef 81%  . Esophagogastroduodenoscopy (egd) with esophageal dilation  05-20-2011  . Colonoscopy  last one 2014  . Needle guided excision breast calcifications Right 08-09-2008  . Cystoscopy with retrograde pyelogram, ureteroscopy and stent placement Right 02/13/2014    Procedure: CYSTOSCOPY WITH RETROGRADE PYELOGRAM, URETEROSCOPY AND STENT PLACEMENT;  Surgeon: Bernestine Amass, MD;  Location: [facility];  Service: Urology;  Laterality: Right;  . Holmium laser application Right [id number]    Procedure: HOLMIUM LASER APPLICATION;  Surgeon: Bernestine Amass, MD;  Location: [facility];  Service: Urology;  Laterality: Right;  . Esophagogastroduodenoscopy N/A 02/15/2014    Procedure: ESOPHAGOGASTRODUODENOSCOPY (EGD);  Surgeon: Jerene Bears, MD;  Location: [facility];  Service: Endoscopy;  Laterality: N/A;  . Lung biopsy Right 01/24/2015    Procedure: RIGHT LUNG BIOPSY;  Surgeon: Ivin Poot, MD;  Location: [place];  Service: Thoracic;  Laterality: Right;  . Video bronchoscopy N/A 01/24/2015    Procedure: RIGHT VIDEO BRONCHOSCOPY;  Surgeon: Ivin Poot, MD;  Location: [facility] OR;  Service: Thoracic;  Laterality: N/A;  . Cardiac catheterization Right 01/24/2015    Procedure: right femoral ARTERIAL LINE INSERTION;  Surgeon: Ivin Poot, MD;  Location: [place];  Service: Thoracic;  Laterality: Right;  . Thoracotomy Right 01/24/2015    Procedure: RIGHT MINI/LIMITED THORACOTOMY;  Surgeon: Ivin Poot, MD;  Location: [facility] OR;  Service: Thoracic;  Laterality: Right;    Current Medications: Outpatient Prescriptions Prior to Visit  Medication Sig Dispense Refill  . ALPRAZolam (XANAX) 0.25 MG tablet  Take 0.25 mg by mouth at bedtime.    Marland Kitchen aspirin EC 81 MG tablet Take 81 mg by mouth daily.    . calcitRIOL (ROCALTROL) 0.5 MCG capsule Take 0.5 mcg by mouth daily.    . clopidogrel (PLAVIX) 75 MG tablet Take 1 tablet (75 mg total) by mouth daily with breakfast. 30 tablet 10  . diltiazem (MATZIM LA) 240 MG 24 hr tablet Take 240 mg by mouth every morning.     Marland Kitchen doxazosin (CARDURA) 2 MG tablet Take 2 mg by mouth daily.    . fluticasone furoate-vilanterol (BREO ELLIPTA) 100-25 MCG/INH AEPB Inhale 1 puff into the lungs daily. 1 each 0  . furosemide (LASIX) 20 MG tablet Take 1 tablet (20 mg total) by mouth every  other day. 15 tablet 6  . isosorbide mononitrate (IMDUR) 60 MG 24 hr tablet TAKE 1 AND 1/2 TABLETS BY MOUTH EVERY DAY (Patient taking differently: TAKE 1 AND 1/2 TABLETS= 90 mg BY MOUTH EVERY DAY) 45 tablet 10  . levETIRAcetam (KEPPRA) 500 MG tablet Take 1 tablet (500 mg total) by mouth 2 (two) times daily. 60 tablet 11  . levothyroxine (SYNTHROID, LEVOTHROID) 100 MCG tablet Take 100 mcg by mouth daily before breakfast.    . losartan (COZAAR) 100 MG tablet Take 100 mg by mouth every morning.     . metoprolol succinate (TOPROL-XL) 50 MG 24 hr tablet Take 50 mg by mouth daily. Take with or immediately following a meal.    . nitroGLYCERIN (NITROSTAT) 0.4 MG SL tablet Place 1 tablet (0.4 mg total) under the tongue every 5 (five) minutes as needed for chest pain. 25 tablet 5  . pantoprazole (PROTONIX) 40 MG tablet TAKE 1 TABLET (40 MG TOTAL) BY MOUTH 2 (TWO) TIMES DAILY. 60 tablet 11  . PARoxetine (PAXIL) 30 MG tablet Take 30 mg by mouth daily.    . potassium chloride SA (K-DUR,KLOR-CON) 20 MEQ tablet Take 1 tablet (20 mEq total) by mouth every other day. Take with Lasix. 30 tablet 6  . rosuvastatin (CRESTOR) 20 MG tablet Take 1 tablet (20 mg total) by mouth every morning. 90 tablet 3   No facility-administered medications prior to visit.     Allergies:   Tape   Social History   Social History    . Marital Status: Married    Spouse Name: N/A  . Number of Children: 0  . Years of Education: hs   Occupational History  . Retired    Social History Main Topics  . Smoking status: Former Smoker -- 0.30 packs/day for 3 years    Types: Cigarettes    Start date: 11/08/1964    Quit date: 11/09/1967  . Smokeless tobacco: Never Used  . Alcohol Use: No  . Drug Use: No  . Sexual Activity: Not Asked   Other Topics Concern  . None   Social History Narrative   Daily caffeine use: coffee.     Family History:  The patient's family history includes Breast cancer in her sister; CVA in her mother; Cancer in her sister; Coronary artery disease in her father; Diabetes Mellitus I in her father; Emphysema in her father; Heart attack in her mother; Heart disease in her father; Hypertension in her father. There is no history of Colon cancer.   ROS:   Please see the history of present illness.    ROS All other systems reviewed and are negative.   PHYSICAL EXAM:   VS:  BP 118/74 mmHg  Pulse 80  Ht 4\' 11"  (1.499 m)  Wt 147 lb (66.679 kg)  BMI 29.67 kg/m2   GEN: Well nourished, well developed, in no acute distress HEENT: normal Neck: no JVD, carotid bruits, or masses Cardiac: RRR; no murmurs, rubs, or gallops,no edema.  Intact distal pulses bilaterally.  Respiratory:  clear to auscultation bilaterally, normal work of breathing GI: soft, nontender, nondistended, + BS MS: no deformity or atrophy Skin: warm and dry, no rash Neuro:  Alert and Oriented x 3, Strength and sensation are intact Psych: euthymic mood, full affect  Wt Readings from Last 3 Encounters:  06/05/15 147 lb (66.679 kg)  06/05/15 146 lb 2.6 oz (66.3 kg)  06/03/15 149 lb 7.6 oz (67.8 kg)      Studies/Labs Reviewed:   EKG:  EKG is not ordered today.    Recent Labs: 10/15/2014: TSH 0.221* 03/01/2015: B Natriuretic Peptide 128.8* 03/02/2015: ALT 8* 03/03/2015: BUN 6; Creatinine, Ser 0.95; Hemoglobin 8.7*; Magnesium 1.7;  Platelets 317; Potassium 3.3*; Sodium 142   Lipid Panel    Component Value Date/Time   CHOL 140 10/15/2014 0311   TRIG 72 10/15/2014 0311   HDL 67 10/15/2014 0311   CHOLHDL 2.1 10/15/2014 0311   VLDL 14 10/15/2014 0311   LDLCALC 59 10/15/2014 0311    Additional studies/ records that were reviewed today include:  none    ASSESSMENT:    1. Atherosclerosis of native coronary artery of native heart without angina pectoris   2. Essential hypertension   3. Paroxysmal SVT (supraventricular tachycardia) ([place])   4. Pericardial cyst   5. Hyperlipidemia      PLAN:  In order of problems listed above:  1. ASCAD with no angina - continue ASA/Plavix/statin/BB/imdur 2. HTN - BP well controlled - continue CCB/cardura/ARB 3. SVT - no reoccurence 4. Pericardial cyst s/p resection 5. Hyperlipidemia - LDL goal < 70.  I will get an FLP and ALT.  Continue statin.      Medication Adjustments/Labs and Tests Ordered: Current medicines are reviewed at length with the patient today.  Concerns regarding medicines are outlined above.  Medication changes, Labs and Tests ordered today are listed in the Patient Instructions below.  There are no Patient Instructions on file for this visit.   Signed, Fransico Him, MD  06/05/2015 3:34 PM    [address], [place], [place]  [address] Phone: [phone]; Fax: [phone]

## 2015-06-05 NOTE — Patient Instructions (Signed)

## 2015-06-06 ENCOUNTER — Other Ambulatory Visit (INDEPENDENT_AMBULATORY_CARE_PROVIDER_SITE_OTHER): Payer: PPO | Admitting: *Deleted

## 2015-06-06 ENCOUNTER — Telehealth: Payer: Self-pay | Admitting: Internal Medicine

## 2015-06-06 ENCOUNTER — Ambulatory Visit: Payer: Self-pay

## 2015-06-06 DIAGNOSIS — I471 Supraventricular tachycardia: Secondary | ICD-10-CM | POA: Diagnosis not present

## 2015-06-06 DIAGNOSIS — E785 Hyperlipidemia, unspecified: Secondary | ICD-10-CM

## 2015-06-06 DIAGNOSIS — I2510 Atherosclerotic heart disease of native coronary artery without angina pectoris: Secondary | ICD-10-CM | POA: Diagnosis not present

## 2015-06-06 DIAGNOSIS — I10 Essential (primary) hypertension: Secondary | ICD-10-CM

## 2015-06-06 LAB — HEPATIC FUNCTION PANEL
ALBUMIN: 3.7 g/dL (ref 3.6–5.1)
ALT: 6 U/L (ref 6–29)
AST: 13 U/L (ref 10–35)
Alkaline Phosphatase: 117 U/L (ref 33–130)
Bilirubin, Direct: 0.1 mg/dL (ref <=0.2)
Indirect Bilirubin: 0.3 mg/dL (ref 0.2–1.2)
TOTAL PROTEIN: 6.7 g/dL (ref 6.1–8.1)
Total Bilirubin: 0.4 mg/dL (ref 0.2–1.2)

## 2015-06-06 LAB — LIPID PANEL
Cholesterol: 147 mg/dL (ref 125–200)
HDL: 82 mg/dL (ref >=46)
LDL CALC: 54 mg/dL (ref <130)
TRIGLYCERIDES: 55 mg/dL (ref <150)
Total CHOL/HDL Ratio: 1.8 Ratio (ref <=5.0)
VLDL: 11 mg/dL (ref <30)

## 2015-06-06 LAB — BASIC METABOLIC PANEL
BUN: 14 mg/dL (ref 7–25)
CHLORIDE: 103 mmol/L (ref 98–110)
CO2: 25 mmol/L (ref 20–31)
Calcium: 7.5 mg/dL — ABNORMAL LOW (ref 8.6–10.4)
Creat: 1.12 mg/dL — ABNORMAL HIGH (ref 0.50–0.99)
Glucose, Bld: 121 mg/dL — ABNORMAL HIGH (ref 65–99)
POTASSIUM: 3.3 mmol/L — AB (ref 3.5–5.3)
Sodium: 142 mmol/L (ref 135–146)

## 2015-06-06 NOTE — Telephone Encounter (Signed)
Ok to [employer]

## 2015-06-06 NOTE — Addendum Note (Signed)
Addended by: Eulis Foster on: 06/06/2015 09:28 AM   Modules accepted: Orders

## 2015-06-06 NOTE — Telephone Encounter (Signed)
Spoke with pt and gave MR's approval to d/c Breo. Nothing further needed.

## 2015-06-06 NOTE — Telephone Encounter (Signed)
Patient Instructions       ICD-9-CM ICD-10-CM   1. ILD (interstitial lung disease) ([place]) 515 J84.9   2. Dyspnea and respiratory abnormality 786.09 R06.00     R06.89    Albuterol was given to you by hospitalist Dr Devine feb 2017 - I did not prescribe this - we do no have samples for this - Try Breo inhaler 1 puff dialy for 2 weeks - empiric trial Overall glad you are better with rehab and [facility] ILD clinic was reassuring  I will review Dr Lauris Chroman ([place] [facility]) followup notes   Followup Call in 2 weeks with response to breo HRCT 3 months REturn to see me in 3 months  - if still winded, then consider CPST bike test   Spoke with pt. States that she can't tell a difference in her breathing since starting [place]. MR told her to call us and let us know how this medication was working. Advised her that I would get her message to MR to address. She is aware that he is on vacation and is okay with waiting until his return.  MR - please advise. Thanks.

## 2015-06-10 ENCOUNTER — Encounter (HOSPITAL_COMMUNITY)
Admission: RE | Admit: 2015-06-10 | Discharge: 2015-06-10 | Disposition: A | Discharge status: Home / Self Care | Payer: PPO | Source: Ambulatory Visit | Attending: Internal Medicine | Admitting: Internal Medicine

## 2015-06-10 VITALS — Wt 146.4 lb

## 2015-06-10 DIAGNOSIS — J849 Interstitial pulmonary disease, unspecified: Secondary | ICD-10-CM

## 2015-06-10 NOTE — Progress Notes (Signed)
Daily Session Note  Patient Details  Name: [name] MRN: [id number] Date of Birth: 12/17/1945 Referring Provider:    Encounter Date: 06/10/2015  Check In:     Session Check In - 06/10/15 1023    Check-In   [facility]   Staff Present Joan Behrens, RN, BSN;Molly diVincenzo, MS, ACSM RCEP, Exercise Physiologist;Annedrea Stackhouse, RN, MHA; , RN, BSN   Supervising physician immediately available to respond to emergencies [facility] immediately available   Physician(s) Dr. Merrell   Medication changes reported     No   Fall or balance concerns reported    No   Warm-up and Cool-down Performed as group-led instruction   Resistance Training Performed Yes   VAD Patient? No   Pain Assessment   Currently in Pain? No/denies   Multiple Pain Sites No      Capillary Blood Glucose: No results found for this or any previous visit (from the past 24 hour(s)).      Exercise Prescription Changes - 06/10/15 1216    Response to Exercise   Blood Pressure (Admit) 116/54 mmHg   Blood Pressure (Exercise) 150/62 mmHg   Blood Pressure (Exit) 118/74 mmHg   Heart Rate (Admit) 72 bpm   Heart Rate (Exercise) 108 bpm   Heart Rate (Exit) 72 bpm   Oxygen Saturation (Admit) 92 %   Oxygen Saturation (Exercise) 96 %   Oxygen Saturation (Exit) 97 %   Rating of Perceived Exertion (Exercise) 11   Perceived Dyspnea (Exercise) 1   Duration Progress to 45 minutes of aerobic exercise without signs/symptoms of physical distress   Intensity THRR unchanged   Progression   Progression Continue to progress workloads to maintain intensity without signs/symptoms of physical distress.   Resistance Training   Training Prescription Yes   Weight orange bands   Reps 10-12   NuStep   Level 4   Minutes 15   METs 2.6   Rower   Level 1   Minutes 15   Track   Laps 18   Minutes 15     Goals Met:  Independence with exercise equipment Improved SOB with  ADL's Using PLB without cueing & demonstrates good technique Exercise tolerated well No report of cardiac concerns or symptoms Strength training completed today  Goals Unmet:  Not Applicable  Comments: Service time is from 1030 to 1205   Dr. Wesam G. Yacoub is Medical Director for Pulmonary Rehab at [facility]. 

## 2015-06-11 DIAGNOSIS — R21 Rash and other nonspecific skin eruption: Secondary | ICD-10-CM | POA: Diagnosis not present

## 2015-06-12 ENCOUNTER — Encounter (HOSPITAL_COMMUNITY)
Admission: RE | Admit: 2015-06-12 | Discharge: 2015-06-12 | Disposition: A | Discharge status: Home / Self Care | Payer: PPO | Source: Ambulatory Visit | Attending: Internal Medicine | Admitting: Internal Medicine

## 2015-06-12 VITALS — Wt 147.9 lb

## 2015-06-12 DIAGNOSIS — J849 Interstitial pulmonary disease, unspecified: Secondary | ICD-10-CM | POA: Diagnosis not present

## 2015-06-12 DIAGNOSIS — G894 Chronic pain syndrome: Secondary | ICD-10-CM | POA: Diagnosis not present

## 2015-06-12 DIAGNOSIS — M5136 Other intervertebral disc degeneration, lumbar region: Secondary | ICD-10-CM | POA: Diagnosis not present

## 2015-06-12 DIAGNOSIS — M5030 Other cervical disc degeneration, unspecified cervical region: Secondary | ICD-10-CM | POA: Diagnosis not present

## 2015-06-12 DIAGNOSIS — Z79891 Long term (current) use of opiate analgesic: Secondary | ICD-10-CM | POA: Diagnosis not present

## 2015-06-12 DIAGNOSIS — E200 Idiopathic hypoparathyroidism: Secondary | ICD-10-CM | POA: Diagnosis not present

## 2015-06-12 NOTE — Progress Notes (Signed)
Daily Session Note  Patient Details  Name: [name] MRN: [id number] Date of Birth: 10-Jun-1945 Referring Provider:    Encounter Date: 06/12/2015  Check In:     Session Check In - 06/12/15 1040    Check-In   [facility]   Staff Present Rosebud Poles, RN, BSN;Lisa Ysidro Evert, RN;Portia Rollene Rotunda, RN, BSN;Ramon Dredge, RN, MHA;Kailie Polus, MS, ACSM RCEP, Exercise Physiologist   Supervising physician immediately available to respond to emergencies [facility] immediately available   Physician(s) Dr. Renaee Munda   Medication changes reported     No   Fall or balance concerns reported    No   Warm-up and Cool-down Performed as group-led instruction   Resistance Training Performed Yes   VAD Patient? No   Pain Assessment   Currently in Pain? No/denies   Multiple Pain Sites No      Capillary Blood Glucose: No results found for this or any previous visit (from the past 24 hour(s)).      Exercise Prescription Changes - 06/12/15 1300    Response to Exercise   Blood Pressure (Admit) 102/64 mmHg   Blood Pressure (Exercise) 132/70 mmHg   Blood Pressure (Exit) 120/64 mmHg   Heart Rate (Admit) 76 bpm   Heart Rate (Exercise) 92 bpm   Heart Rate (Exit) 68 bpm   Oxygen Saturation (Admit) 99 %   Oxygen Saturation (Exercise) 97 %   Oxygen Saturation (Exit) 97 %   Rating of Perceived Exertion (Exercise) 11   Perceived Dyspnea (Exercise) 1   Duration Progress to 45 minutes of aerobic exercise without signs/symptoms of physical distress   Intensity THRR unchanged   Progression   Progression Continue to progress workloads to maintain intensity without signs/symptoms of physical distress.   Resistance Training   Training Prescription Yes   Weight orange bands   Reps 10-12   Track   Laps 7   Minutes 15     Goals Met:  Exercise tolerated well No report of cardiac concerns or symptoms Strength training completed today  Goals Unmet:  Not  Applicable  Comments: Service time is from 10:30am to 12:00pm    Dr. Rush Farmer is Medical Director for Pulmonary Rehab at [facility].

## 2015-06-17 ENCOUNTER — Encounter (HOSPITAL_COMMUNITY): Payer: PPO

## 2015-06-17 ENCOUNTER — Encounter: Payer: Self-pay | Admitting: Cardiology

## 2015-06-17 ENCOUNTER — Other Ambulatory Visit: Payer: Self-pay

## 2015-06-17 DIAGNOSIS — R21 Rash and other nonspecific skin eruption: Secondary | ICD-10-CM | POA: Diagnosis not present

## 2015-06-17 NOTE — Patient Outreach (Signed)
[facility]) Care Management  06/17/2015  [name] Dec 07, 1945 [id number]  Telephonic Monthly Assessment   Outreach call to patient.  Patient not reached.  RN CM left HIPAA compliant voice message with name and number.  RN CM scheduled for next contact call within one week.   [name], RN, BSN, [facility], CCM  [facility] [employer] Management Coordinator [phone] Direct [phone] Cell [phone] Office [phone] Fax

## 2015-06-19 ENCOUNTER — Encounter (HOSPITAL_COMMUNITY)
Admission: RE | Admit: 2015-06-19 | Discharge: 2015-06-19 | Disposition: A | Discharge status: Home / Self Care | Payer: PPO | Source: Ambulatory Visit | Attending: Internal Medicine | Admitting: Internal Medicine

## 2015-06-19 ENCOUNTER — Ambulatory Visit: Payer: Self-pay

## 2015-06-19 VITALS — Wt 149.0 lb

## 2015-06-19 DIAGNOSIS — J849 Interstitial pulmonary disease, unspecified: Secondary | ICD-10-CM

## 2015-06-19 NOTE — Progress Notes (Signed)
Daily Session Note  Patient Details  Name: [name] MRN: [id number] Date of Birth: Mar 13, 1945 Referring Provider:    Encounter Date: 06/19/2015  Check In:     Session Check In - 06/19/15 1046    Check-In   [facility]   Staff Present Rosebud Poles, RN, BSN;Jazariah Teall, MS, ACSM RCEP, Exercise Physiologist;Lisa Ysidro Evert, Felipe Drone, RN, MHA;Portia Rollene Rotunda, RN, BSN   Supervising physician immediately available to respond to emergencies [facility] immediately available   Physician(s) Dr. Marily Memos   Medication changes reported     No   Fall or balance concerns reported    No   Warm-up and Cool-down Performed as group-led instruction   Resistance Training Performed Yes   VAD Patient? No   Pain Assessment   Currently in Pain? No/denies   Multiple Pain Sites No      Capillary Blood Glucose: No results found for this or any previous visit (from the past 24 hour(s)).      Exercise Prescription Changes - 06/19/15 1100    Response to Exercise   Blood Pressure (Admit) 120/50 mmHg   Blood Pressure (Exercise) 128/80 mmHg   Blood Pressure (Exit) 120/70 mmHg   Heart Rate (Admit) 85 bpm   Heart Rate (Exercise) 102 bpm   Heart Rate (Exit) 81 bpm   Oxygen Saturation (Admit) 98 %   Oxygen Saturation (Exercise) 98 %   Oxygen Saturation (Exit) 98 %   Rating of Perceived Exertion (Exercise) 11   Perceived Dyspnea (Exercise) 1   Duration Progress to 45 minutes of aerobic exercise without signs/symptoms of physical distress   Intensity THRR unchanged   Progression   Progression Continue to progress workloads to maintain intensity without signs/symptoms of physical distress.   Resistance Training   Training Prescription Yes   Weight orange bands   Reps 10-12   Rower   Level 1   Minutes 15   Track   Laps 15   Minutes 15     Goals Met:  Exercise tolerated well No report of cardiac concerns or symptoms Strength training completed  today  Goals Unmet:  Not Applicable  Comments: Service time is from 10:30AM to 11:50PM    Dr. Rush Farmer is Medical Director for Pulmonary Rehab at [facility].

## 2015-06-20 ENCOUNTER — Other Ambulatory Visit: Payer: Self-pay

## 2015-06-20 NOTE — Patient Outreach (Signed)
[facility]) Care Management  06/20/2015  CLOVIE DAIUTO 01-31-1945 [id number]   Care Coordination Services  Issue:  Medication prescription pending Dr. Carol Ada  Contact states Dr. Tamala Julian called in new prescription on 06/16/15 for symptom management for urticaria associated to hives but pharmacy has not received the order yet.   RN CM contacted Dr. Tamala Julian:  Office Contact verifies [facility] [place] at 11:55 am today (06/20/15).   Hydralazine #30 tablets -  25 mg tablets  1 tablet every  8 hours as needed.    Plan: RN CM will contact patient with the above update and discuss mediation side effects, instructions and sedative effects which increase risk of falls.   [name], RN, BSN, [facility], CCM  [employer] Management Coordinator [phone] Direct [phone] Cell [phone] Office [phone] Fax

## 2015-06-20 NOTE — Patient Outreach (Signed)
[name]  06/20/2015  [name] [birth year] [id number]    Telephonic Monthly Assessment Program: HTN 03/13/15   Subjective:  Patient C/O hives with itching.  States she followed up with MD and no cause determined thus far.  States MD was going to call in a prescription on Monday 06/16/15 but pharmacy has not received order yet.   Patient states she continues with pulmonary rehab services twice a week and will complete last appt next week.  States improvement of symptoms with rehab services.  Providers: Primary MD: Dr. Carol Ada - last appt:06/16/15 Pulmonologist: Dr. Brand Males - 05/20/15 [facility]. Dr. Lauris Chroman 05/2015 Nephrologist - 04/28/15 OP Pulmonary Rehab:initiated 3/6/207 - 6 13/17 HH: None   Psycho/Social: Patient lives in the home with her husband.  Mobility: Ambulating independently but has a walker if needed. Currently does not require use of walker.  Falls: 2 over past year; Last fall date 03/01/15 requiring ED visit due to hitting head - CAT scan - neg.  Pain: none Depression:  None Transportation: Husband Caregiver: Husband, [name] Consent: Patient agreed to [facility] services. Patient gave verbal consent to discuss PHI with husband, [name] as needed.  DME: walker, home BP cuff, home oxygen (provider: [facility] 2 L/m via [place] at night).   HTN, Lung Disease, Chronic kidney disease, stage III ER visits: 0 / Admissions: 3  Last admission 03/01/2015 - 03/03/2015 Syncope secondary to orthostatic hypotension and illness.  BP 118/74.BP is monitored by Pulmonary Rehab services on Tuesdays and Thursdays and has remained normal. Patient states she continues with pulmonary rehab services twice a week and will complete last appt next week.  States improvement of symptoms with rehab services H/o  [facility] Education mailed 04/02/2015 (Reviewed 05/05/2015, 06/20/15) -Diffuse Interstitial  Lung Disease -Pulmonary Hypertension  -Low-Salt Diet -Know Before You Go - Your guide for where to go when you need medical care.  Patient verbalized understanding of materials and states was helpful information.     Hives: Patient C/O hives with itching.  States she followed up with Dr. Inda Merlin and no cause determined thus far.  States MD was going to call in a prescription on Monday 06/16/15 but pharmacy has not received order yet (06/20/15).    Medications:  Patient taking more than 10 medications  Co-pay cost issues: none ($0 - 12 dollars) Flu Vaccine: 10/09/14 PCV13: 01/15/2014 PPSV23: 11/23/2009 Medication reconciliation completed with patient on 06/20/15 New Medication for Hives/itching _pending  Encounter Medications:  Outpatient Encounter Prescriptions as of 06/20/2015  Medication Sig Note  . ALPRAZolam (XANAX) 0.25 MG tablet Take 0.25 mg by mouth at bedtime.   Marland Kitchen aspirin EC 81 MG tablet Take 81 mg by mouth daily.   . calcitRIOL (ROCALTROL) 0.5 MCG capsule Take 0.5 mcg by mouth daily.   . clopidogrel (PLAVIX) 75 MG tablet Take 1 tablet (75 mg total) by mouth daily with breakfast.   . diltiazem (MATZIM LA) 240 MG 24 hr tablet Take 240 mg by mouth every morning.    Marland Kitchen doxazosin (CARDURA) 2 MG tablet Take 2 mg by mouth daily. 03/17/2015: Received from: External Pharmacy Received Sig:   . fluticasone furoate-vilanterol (BREO ELLIPTA) 100-25 MCG/INH AEPB Inhale 1 puff into the lungs daily.   . furosemide (LASIX) 20 MG tablet Take 1 tablet (20 mg total) by mouth every other day.   . hydrOXYzine (ATARAX/VISTARIL) 25 MG tablet Take 25 mg by mouth every 8 (eight) hours as needed (  Hives).   . isosorbide mononitrate (IMDUR) 60 MG 24 hr tablet TAKE 1 AND 1/2 TABLETS BY MOUTH EVERY DAY (Patient taking differently: TAKE 1 AND 1/2 TABLETS= 90 mg BY MOUTH EVERY DAY)   . levETIRAcetam (KEPPRA) 500 MG tablet Take 1 tablet (500 mg total) by mouth 2 (two) times daily.   Marland Kitchen levothyroxine (SYNTHROID,  LEVOTHROID) 100 MCG tablet Take 100 mcg by mouth daily before breakfast.   . losartan (COZAAR) 100 MG tablet Take 100 mg by mouth every morning.    . metoprolol succinate (TOPROL-XL) 50 MG 24 hr tablet Take 50 mg by mouth daily. Take with or immediately following a meal. 02/23/2015: .  . nitroGLYCERIN (NITROSTAT) 0.4 MG SL tablet Place 1 tablet (0.4 mg total) under the tongue every 5 (five) minutes as needed for chest pain.   . pantoprazole (PROTONIX) 40 MG tablet TAKE 1 TABLET (40 MG TOTAL) BY MOUTH 2 (TWO) TIMES DAILY.   Marland Kitchen PARoxetine (PAXIL) 30 MG tablet Take 30 mg by mouth daily.   . potassium chloride SA (K-DUR,KLOR-CON) 20 MEQ tablet Take 1 tablet (20 mEq total) by mouth every other day. Take with Lasix.   Marland Kitchen rosuvastatin (CRESTOR) 20 MG tablet Take 1 tablet (20 mg total) by mouth every morning.    No facility-administered encounter medications on file as of 06/20/2015.    Functional Status:  In your present state of health, do you have any difficulty performing the following activities: 03/13/2015 03/01/2015  Hearing? N N  Vision? N N  Difficulty concentrating or making decisions? N N  Walking or climbing stairs? N N  Dressing or bathing? N N  Doing errands, shopping? N N  Preparing Food and eating ? N -  Using the Toilet? N -  In the past six months, have you accidently leaked urine? N -  Do you have problems with loss of bowel control? N -  Managing your Medications? N -  Managing your Finances? N -  Housekeeping or managing your Housekeeping? N -    Fall/Depression Screening: PHQ 2/9 Scores 05/05/2015 04/02/2015 03/17/2015 03/13/2015 08/09/2012 05/01/2012  PHQ - 2 Score 0 0 2 0 0 3  PHQ- 9 Score 0 2 9 - 1 5   Fall Risk  06/20/2015 05/05/2015 04/02/2015 03/17/2015 03/13/2015  Falls in the past year? Yes Yes Yes Yes Yes  Number falls in past yr: 2 or more 2 or more 2 or more 2 or more 2 or more  Injury with Fall? No No No No Yes  Risk Factor Category  High Fall Risk High Fall Risk High Fall  Risk High Fall Risk High Fall Risk  Risk for fall due to : History of fall(s);Other (Comment) History of fall(s);Other (Comment) History of fall(s);Other (Comment) Other (Comment);Medication side effect History of fall(s);Impaired balance/gait  Risk for fall due to (comments): H/o orthostatic Hypotension H/o orthostatic hypotension H/o orthostatic Hypotension orthostatic hypotension -  Follow up Falls evaluation completed;Education provided;Falls prevention discussed Falls evaluation completed;Education provided;Falls prevention discussed Falls evaluation completed;Education provided;Falls prevention discussed Education provided Falls evaluation completed;Education provided;Falls prevention discussed   Assessment: No new admissions over the past 90 plus days.  No new falls.  Improved pulmonary symptom [name] with pulmonary rehab.  Hives  Plan:  Referral Date: 02/28/15 Source: HTA Tier 4 Issue: ED and Hospital Admission utilization. DM. (Update:Patient does not have diabetes diagnosis). Screening 03/13/15 Telephonic RN CM services: 03/13/15 Initial Assessment: 04/02/15 Program: HTN 03/13/15  HTN, Lung Disease, Chronic kidney disease, stage III RN  CM reviewed [employer] [facility] again this call.  RN CM stressed importance of medication, appt and self [name] interventions.  RN CM encouraged to seek MD appt should patient have change in condition to avoid unnecessary admissions or ED visits.   RN CM stressed healthy / preventive interventions: avoid exposure to illness in others, hand washing and continuing self rehab interventions post OP Rehab discharge plan.   Hives, urticaria,  RN CM reviewed possible exposure to irritants, new soap, clothes detergent, pets (treated with flea,tick medications), new foods or new medicine.   Patient denied any knowledge of any known exposures.  RN CM advised in stress [name] as stress can also contribute to these symptoms.  RN CM  advised RN CM will follow-up with MD regarding new prescription which pharmacy does not have order for yet.  [facility].   RN CM advised in next contact call within the next 30 days for telephonic monthly assessment. RN CM advised to please notify MD of any changes in condition prior to scheduled appt's.  -shortness of breath, fever, falls, other illnesses.  RN CM provided contact name and # [phone] or main office # [phone] and 24-hour nurse line # [phone].  RN CM confirmed patient is aware of 911 services for urgent emergency needs.  [name], RN, BSN, [facility], CCM  [employer] [name] Coordinator [phone] Direct [phone] Cell [phone] Office [phone] Fax

## 2015-06-21 ENCOUNTER — Other Ambulatory Visit: Payer: Self-pay

## 2015-06-21 NOTE — Patient Outreach (Addendum)
[facility]) Care Management  06/20/2015  ADDIELYNN KLEINBERG 1945/03/02 [id number]    Care Coordination Services  Issue:New Medication prescription:  Hydralazine #30 tablets - 25 mg tablets 1 tablet every 8 hours as needed. Ordering provider:  Dr. Carol Ada  RN CM contacted patient to advised Dr. Tamala Julian: Office Contact verifies [facility] [place] at 11:55 am today (06/20/15). RN CM advised patient to pick up prescription today.   RN CM advised patient in mediation side effects, instructions and sedative effects which increase risk of falls.  RN CM advised to keep walker by bedside at night to use if patient needs to get up during night.  RN CM advised not to ambulate immediately after taking medication until patient is able to determine how she will be affected by medication to avoid falls Patient verbalized understanding.   Plan: RN CM will continue to assess for symptom relief, medication side effects, fall risk and possible etiology of hives.   [place]  not responding at this time: See addition below: Medication Management Problem #2: Risk of fall associated to new prescription:  Hydralazine.  Patient will have adhere to use of walker and fall prevention tips; and have no falls over the next 30 days.  RN CM will provide tips on fall prevention, medication side effects and reporting if no improvement in symptom management to MD  over the next 30 days.   [name], RN, BSN, [facility], CCM  [employer] Management Coordinator [phone] Direct [phone] Cell [phone] Office [phone] Fax

## 2015-06-24 ENCOUNTER — Encounter (HOSPITAL_COMMUNITY)
Admission: RE | Admit: 2015-06-24 | Discharge: 2015-06-24 | Disposition: A | Discharge status: Home / Self Care | Payer: PPO | Source: Ambulatory Visit | Attending: Internal Medicine | Admitting: Internal Medicine

## 2015-06-24 DIAGNOSIS — J849 Interstitial pulmonary disease, unspecified: Secondary | ICD-10-CM

## 2015-06-25 ENCOUNTER — Encounter: Payer: Self-pay | Admitting: Adult Health

## 2015-06-25 ENCOUNTER — Ambulatory Visit (INDEPENDENT_AMBULATORY_CARE_PROVIDER_SITE_OTHER): Payer: PPO | Admitting: Adult Health

## 2015-06-25 VITALS — BP 126/62 | HR 78 | Resp 16 | Ht 59.0 in | Wt 147.8 lb

## 2015-06-25 DIAGNOSIS — R202 Paresthesia of skin: Secondary | ICD-10-CM

## 2015-06-25 DIAGNOSIS — R569 Unspecified convulsions: Secondary | ICD-10-CM

## 2015-06-25 DIAGNOSIS — R519 Headache, unspecified: Secondary | ICD-10-CM

## 2015-06-25 DIAGNOSIS — R51 Headache: Secondary | ICD-10-CM

## 2015-06-25 DIAGNOSIS — R200 Anesthesia of skin: Secondary | ICD-10-CM

## 2015-06-25 MED ORDER — LEVETIRACETAM 500 MG PO TABS: 500.0000 mg | ORAL_TABLET | Freq: Two times a day (BID) | ORAL | Status: DC

## 2015-06-25 NOTE — Patient Instructions (Signed)
Continue Keppra 500 mg twice a day Nerve conduction studies for left hand numbness

## 2015-06-25 NOTE — Progress Notes (Signed)
I agree with the assessment and plan as directed by NP .The patient is known to me .   Stalin Gruenberg, MD  

## 2015-06-25 NOTE — Progress Notes (Signed)
PATIENT: [name] DOB: [birth year]  REASON FOR VISIT: follow up- seizures HISTORY FROM: patient  HISTORY OF PRESENT ILLNESS: [name] is a 70 year old female with a history of seizures. She returns today for follow-up. She continues on Keppra 500 mg twice a day. She denies any seizure events. She states that she has not had a seizure in over 30 years. She does operate a motor vehicle but infrequently. She is able to complete all ADLs independently. She does report new onset of numbness and tingling in the left hand. She states this started about a week ago. She states that it is not constant but it is daily. She states that it feels as if "she is hit her funny bone." She denies any weakness in the upper or lower extremities. Denies a facial droop or trouble speaking. The numbness and tingling does not start at the top of her arm and descend to the fingertips. She states usually it is in the hand and occasionally will be in the forearm. She denies any changes with her gait or balance. Denies any changes with the bowels or bladder. Patient states that she has had ongoing headaches usually located in the back of the neck and head. She states that over the last month her headaches have gotten slightly worse. In the past that have been pretty well controlled. She states that she has had some issues with her breathing and has been in pulmonary rehabilitation. She returns today for an evaluation.   HISTORY 07/29/14: [name] is a 70 year old female with a history of seizures. She returns today for follow-up. She is currently taking Keppra 500 mg twice a day. She reports that she is tolerating this medication well. She denies any seizure events. She is able to complete all ADLs independently. She operates a Teacher, music without difficulty. Denies any changes with her gait or balance. She reports that her headaches have been controlled. He states that her headaches are very rare now. She does state  that today she accidentally put on 2 different shoes. Denies any trouble with her memory. Denies any new medical issues. She continues to see Dr. Nelva Bush for her neck and back. She states that she just recently got epidural steroid injections. She returns today for an evaluation.   HISTORY 07/26/13: Jasmine. [name] is a 69 year old left-handed white female with a history of seizures. The patient has done quite well with her seizures, and these events tend to be nocturnal in nature. She indicates that it has been several years since her last seizure. She does operate a Teacher, music. She has been followed by Dr. Nelva Bush for neck and low back pain, and she has received some injections. She is also had an injection in the right knee. She is on Keppra, and she is tolerating the medication well. She indicates that her headaches are doing fairly well, she is having about one headache a month. She returns for an evaluation. She recently had a mediastinal cyst resection. The patient has chronic right arm weakness.   REVIEW OF SYSTEMS: Out of a complete 14 system review of symptoms, the patient complains only of the following symptoms, and all other reviewed systems are negative.  See history of present illness  ALLERGIES: Allergies  Allergen Reactions  . Tape Rash    ? Clear tape    HOME MEDICATIONS: Outpatient Prescriptions Prior to Visit  Medication Sig Dispense Refill  . ALPRAZolam (XANAX) 0.25 MG tablet Take 0.25 mg by mouth at  bedtime.    Marland Kitchen aspirin EC 81 MG tablet Take 81 mg by mouth daily.    . calcitRIOL (ROCALTROL) 0.5 MCG capsule Take 0.5 mcg by mouth daily.    . clopidogrel (PLAVIX) 75 MG tablet Take 1 tablet (75 mg total) by mouth daily with breakfast. 30 tablet 10  . diltiazem (MATZIM LA) 240 MG 24 hr tablet Take 240 mg by mouth every morning.     Marland Kitchen doxazosin (CARDURA) 2 MG tablet Take 2 mg by mouth daily.    . furosemide (LASIX) 20 MG tablet Take 1 tablet (20 mg total) by mouth every other day.  15 tablet 6  . isosorbide mononitrate (IMDUR) 60 MG 24 hr tablet TAKE 1 AND 1/2 TABLETS BY MOUTH EVERY DAY (Patient taking differently: TAKE 1 AND 1/2 TABLETS= 90 mg BY MOUTH EVERY DAY) 45 tablet 10  . levETIRAcetam (KEPPRA) 500 MG tablet Take 1 tablet (500 mg total) by mouth 2 (two) times daily. 60 tablet 11  . levothyroxine (SYNTHROID, LEVOTHROID) 100 MCG tablet Take 100 mcg by mouth daily before breakfast.    . losartan (COZAAR) 100 MG tablet Take 100 mg by mouth every morning.     . metoprolol succinate (TOPROL-XL) 50 MG 24 hr tablet Take 50 mg by mouth daily. Take with or immediately following a meal.    . nitroGLYCERIN (NITROSTAT) 0.4 MG SL tablet Place 1 tablet (0.4 mg total) under the tongue every 5 (five) minutes as needed for chest pain. 25 tablet 5  . pantoprazole (PROTONIX) 40 MG tablet TAKE 1 TABLET (40 MG TOTAL) BY MOUTH 2 (TWO) TIMES DAILY. 60 tablet 11  . PARoxetine (PAXIL) 30 MG tablet Take 30 mg by mouth daily.    . potassium chloride SA (K-DUR,KLOR-CON) 20 MEQ tablet Take 1 tablet (20 mEq total) by mouth every other day. Take with Lasix. 30 tablet 6  . rosuvastatin (CRESTOR) 20 MG tablet Take 1 tablet (20 mg total) by mouth every morning. 90 tablet 3  . fluticasone furoate-vilanterol (BREO ELLIPTA) 100-25 MCG/INH AEPB Inhale 1 puff into the lungs daily. 1 each 0  . hydrOXYzine (ATARAX/VISTARIL) 25 MG tablet Take 25 mg by mouth every 8 (eight) hours as needed (Hives).     No facility-administered medications prior to visit.    PAST MEDICAL HISTORY: Past Medical History  Diagnosis Date  . Irritable bowel syndrome   . Hypertension   . Depression   . Hypothyroidism   . Internal hemorrhoids   . Anxiety   . Heart murmur   . Hyperlipidemia   . Vitamin D deficiency   . Chronic low back pain   . Carpal tunnel syndrome   . History of non-ST elevation myocardial infarction (NSTEMI)     2011--  S/P PCI WITH SENTING  . S/P drug eluting coronary stent placement     X3  in 2011,  2012, 2014  . GERD (gastroesophageal reflux disease)   . History of hiatal hernia   . Partial seizure disorder (HCC) NEUROLOGIST-- DR WILLIS    NOCTURNAL  . History of supraventricular tachycardia     2011-- resolved  . History of esophageal dilatation     FOR STRIUCTURE  . Gross hematuria   . Arthritis   . Cervical spondylosis without myelopathy   . S/P pericardial cyst excision     02-05-2013  benign  . Coronary artery disease CARDIOLOGIST-  DR Irish Lack    hx NSTEMI inferoposterior 2011 s/p PCI  total x3 DES's  . History of  gout   . PONV (postoperative nausea and vomiting)     hard time getting iv site-had to do neck stick 2 yrs ago  . Anginal pain ([place])     over yr last time  . Shortness of breath dyspnea   . History of kidney stones   . Seizures ([place])     none in yrs  . Pulmonary fibrosis ([place])   . Chronic kidney disease     PAST SURGICAL HISTORY: Past Surgical History  Procedure Laterality Date  . Mediasternotomy N/A 02/05/2013    Procedure: MEDIAN STERNOTOMY;  Surgeon: Ivin Poot, MD;  [facility] OR;  Service: Thoracic;  Laterality: N/A;  . Biopsy of mediastinal mass N/A 02/05/2013    Procedure: RESECTION OF MEDIASTINAL MASS;  Surgeon: Ivin Poot, MD;  Location: [facility] OR;  Service: Thoracic;  Laterality: N/A;  . Ectopic pregnancy surgery  YRS AGO    SALPINGECTOMY  . Coronary angiogram  03/10/2011    Procedure: CORONARY ANGIOGRAM;  Surgeon: Jettie Booze, MD;  Location: [facility] CATH LAB;  Service: Cardiovascular;;  . Left heart catheterization with coronary angiogram N/A 03/14/2012    Procedure: LEFT HEART CATHETERIZATION WITH CORONARY ANGIOGRAM;  Surgeon: Sueanne Margarita, MD;  Location: [place] CATH LAB;  Service: Cardiovascular;  Laterality: N/A;  Normal LM,  50% pLAD,  70% mLAD,  D2 50-70%, very tortuous LAD,  70-82% ostial LCFX,  50% in-stent restenosis of  dRCA and mRCA  stent and 50-70% ostial PDA,  normal LVSF, ef 60%  . Percutaneous coronary stent intervention  (pci-s) N/A 04/03/2012    Procedure: PERCUTANEOUS CORONARY STENT INTERVENTION (PCI-S);  Surgeon: Jettie Booze, MD;  Location: [facility] CATH LAB;  Service: Cardiovascular;  Laterality: N/A;   Successful PCI  mLAD with 2.75x12 Promus stent, postdilated to >0.68mm  . Coronary angioplasty with stent placement  11-22-2009  dr Irish Lack    Acute inferoposterior MI/  ef 60%,  PCI with DES x1 to dRCA,  25%  mLAD  . Coronary angioplasty with stent placement  06-26-2010  dr Daneen Schick    Cutting balloon angioplasty to dRCA with DES x1,  40% mLAD (non-obstructive CAD)  . Transthoracic echocardiogram  11-17-2011    grade I diastolic dysfunction/  ef 55-60%  . Cardiovascular stress test  11-22-2013  dr Irish Lack    normal lexiscan study/  no ischemia/  normal LVF and wall motion/ ef 81%  . Esophagogastroduodenoscopy (egd) with esophageal dilation  05-20-2011  . Colonoscopy  last one 2014  . Needle guided excision breast calcifications Right 08-09-2008  . Cystoscopy with retrograde pyelogram, ureteroscopy and stent placement Right 02/13/2014    Procedure: CYSTOSCOPY WITH RETROGRADE PYELOGRAM, URETEROSCOPY AND STENT PLACEMENT;  Surgeon: Bernestine Amass, MD;  Location: [facility];  Service: Urology;  Laterality: Right;  . Holmium laser application Right [id number]    Procedure: HOLMIUM LASER APPLICATION;  Surgeon: Bernestine Amass, MD;  Location: [facility];  Service: Urology;  Laterality: Right;  . Esophagogastroduodenoscopy N/A 02/15/2014    Procedure: ESOPHAGOGASTRODUODENOSCOPY (EGD);  Surgeon: Jerene Bears, MD;  Location: [facility];  Service: Endoscopy;  Laterality: N/A;  . Lung biopsy Right 01/24/2015    Procedure: RIGHT LUNG BIOPSY;  Surgeon: Ivin Poot, MD;  Location: [place];  Service: Thoracic;  Laterality: Right;  . Video bronchoscopy N/A 01/24/2015    Procedure: RIGHT VIDEO BRONCHOSCOPY;  Surgeon: Ivin Poot, MD;  Location: [place];  Service: Thoracic;  Laterality: N/A;    .  Cardiac catheterization Right 01/24/2015    Procedure: right femoral ARTERIAL LINE INSERTION;  Surgeon: Ivin Poot, MD;  Location: [place];  Service: Thoracic;  Laterality: Right;  . Thoracotomy Right 01/24/2015    Procedure: RIGHT MINI/LIMITED THORACOTOMY;  Surgeon: Ivin Poot, MD;  Location: [place];  Service: Thoracic;  Laterality: Right;    FAMILY HISTORY: Family History  Problem Relation Age of Onset  . Breast cancer Sister   . Cancer Sister     breast  . Breast cancer      niece  . Heart disease Father   . Hypertension Father   . Emphysema Father   . Coronary artery disease Father   . Diabetes Mellitus I Father   . Colon cancer Neg Hx   . Heart attack Mother   . CVA Mother     SOCIAL HISTORY: Social History   Social History  . Marital Status: Married    Spouse Name: N/A  . Number of Children: 0  . Years of Education: hs   Occupational History  . Retired    Social History Main Topics  . Smoking status: Former Smoker -- 0.30 packs/day for 3 years    Types: Cigarettes    Start date: 11/08/1964    Quit date: 11/09/1967  . Smokeless tobacco: Never Used  . Alcohol Use: No  . Drug Use: No  . Sexual Activity: Not on file   Other Topics Concern  . Not on file   Social History Narrative   Daily caffeine use: coffee.      PHYSICAL EXAM  Filed Vitals:   06/25/15 0858  BP: 126/62  Pulse: 78  Resp: 16  Height: 4\' 11"  (1.499 m)  Weight: 147 lb 12.8 oz (67.042 kg)   Body mass index is 29.84 kg/(m^2).  Generalized: Well developed, in no acute distress   Neurological examination  Mentation: Alert oriented to time, place, history taking. Follows all commands speech and language fluent Cranial nerve II-XII: Pupils were equal round reactive to light. Extraocular movements were full, visual field were full on confrontational test. Facial sensation and strength were normal. Uvula tongue midline. Head turning and shoulder shrug  were normal and  symmetric. Motor: The motor testing reveals 5 over 5 strength of all 4 extremities. Good symmetric motor tone is noted throughout.  Sensory: Sensory testing is intact to soft touch on all 4 extremities. No evidence of extinction is noted.  Coordination: Cerebellar testing reveals good finger-nose-finger and heel-to-shin bilaterally.  Gait and station: Gait is normal. Tandem gait is normal. Romberg is negative. No drift is seen.  Reflexes: Deep tendon reflexes are symmetric and normal bilaterally.   DIAGNOSTIC DATA (LABS, IMAGING, TESTING) - I reviewed patient records, labs, notes, testing and imaging myself where available.  Lab Results  Component Value Date   WBC 8.1 03/03/2015   HGB 8.7* 03/03/2015   HCT 27.4* 03/03/2015   MCV 84.8 03/03/2015   PLT 317 03/03/2015      Component Value Date/Time   NA 142 06/06/2015 0928   K 3.3* 06/06/2015 0928   CL 103 06/06/2015 0928   CO2 25 06/06/2015 0928   GLUCOSE 121* 06/06/2015 0928   BUN 14 06/06/2015 0928   CREATININE 1.12* 06/06/2015 0928   CREATININE 0.95 03/03/2015 0504   CALCIUM 7.5* 06/06/2015 0928   PROT 6.7 06/06/2015 0928   ALBUMIN 3.7 06/06/2015 0928   AST 13 06/06/2015 0928   ALT 6 06/06/2015 0928   ALKPHOS 117 06/06/2015 [id number]  BILITOT 0.4 06/06/2015 0928   GFRNONAA >60 03/03/2015 0504   GFRAA >60 03/03/2015 0504   Lab Results  Component Value Date   CHOL 147 06/06/2015   HDL 82 06/06/2015   LDLCALC 54 06/06/2015   TRIG 55 06/06/2015   CHOLHDL 1.8 06/06/2015   Lab Results  Component Value Date   HGBA1C 6.5* 10/15/2014    Lab Results  Component Value Date   TSH 0.221* 10/15/2014      ASSESSMENT AND PLAN 70 y.o. year old female  has a past medical history of Irritable bowel syndrome; Hypertension; Depression; Hypothyroidism; Internal hemorrhoids; Anxiety; Heart murmur; Hyperlipidemia; Vitamin D deficiency; Chronic low back pain; Carpal tunnel syndrome; History of non-ST elevation myocardial infarction  (NSTEMI); S/P drug eluting coronary stent placement; GERD (gastroesophageal reflux disease); History of hiatal hernia; Partial seizure disorder ([place]) (NEUROLOGIST-- DR Jannifer Franklin); History of supraventricular tachycardia; History of esophageal dilatation; Gross hematuria; Arthritis; Cervical spondylosis without myelopathy; S/P pericardial cyst excision; Coronary artery disease (CARDIOLOGIST-  DR Irish Lack); History of gout; PONV (postoperative nausea and vomiting); Anginal pain ([place]); Shortness of breath dyspnea; History of kidney stones; Seizures ([place]); Pulmonary fibrosis ([place]); and Chronic kidney disease. here with:  1. Seizures 3. Headaches 2. Numbness and tingling in the left hand  The patient will continue on Keppra 500 mg twice a day. She is not had any seizure events in over 30 years. The patient is reporting new symptom of numbness and tingling in the left hand that started 1 week ago. I will complete a nerve conduction study with EMG. Her headaches has also gotten slightly worse in the last month. We will continue to monitor. Patient advised that if her symptoms worsen or she develops any new symptoms she should let us know. She will follow-up in 3 months or sooner if needed.     Ward Givens, MSN, NP-C 06/25/2015, 9:07 AM [facility] [address] [place], [place] [address] [phone]

## 2015-06-26 ENCOUNTER — Encounter (HOSPITAL_COMMUNITY): Payer: PPO

## 2015-07-01 ENCOUNTER — Encounter (HOSPITAL_COMMUNITY): Payer: PPO

## 2015-07-02 DIAGNOSIS — L821 Other seborrheic keratosis: Secondary | ICD-10-CM | POA: Diagnosis not present

## 2015-07-02 DIAGNOSIS — D225 Melanocytic nevi of trunk: Secondary | ICD-10-CM | POA: Diagnosis not present

## 2015-07-02 DIAGNOSIS — D224 Melanocytic nevi of scalp and neck: Secondary | ICD-10-CM | POA: Diagnosis not present

## 2015-07-02 DIAGNOSIS — D2262 Melanocytic nevi of left upper limb, including shoulder: Secondary | ICD-10-CM | POA: Diagnosis not present

## 2015-07-02 DIAGNOSIS — D485 Neoplasm of uncertain behavior of skin: Secondary | ICD-10-CM | POA: Diagnosis not present

## 2015-07-02 DIAGNOSIS — D2272 Melanocytic nevi of left lower limb, including hip: Secondary | ICD-10-CM | POA: Diagnosis not present

## 2015-07-03 ENCOUNTER — Encounter (HOSPITAL_COMMUNITY): Payer: PPO

## 2015-07-03 DIAGNOSIS — R531 Weakness: Secondary | ICD-10-CM | POA: Diagnosis not present

## 2015-07-03 DIAGNOSIS — J8410 Pulmonary fibrosis, unspecified: Secondary | ICD-10-CM | POA: Diagnosis not present

## 2015-07-03 DIAGNOSIS — J9611 Chronic respiratory failure with hypoxia: Secondary | ICD-10-CM | POA: Diagnosis not present

## 2015-07-03 DIAGNOSIS — R0602 Shortness of breath: Secondary | ICD-10-CM | POA: Diagnosis not present

## 2015-07-08 ENCOUNTER — Encounter (HOSPITAL_COMMUNITY): Payer: PPO

## 2015-07-09 ENCOUNTER — Other Ambulatory Visit: Payer: Self-pay | Admitting: Cardiology

## 2015-07-10 ENCOUNTER — Encounter (HOSPITAL_COMMUNITY): Payer: PPO

## 2015-07-15 ENCOUNTER — Encounter (HOSPITAL_COMMUNITY): Payer: PPO

## 2015-07-17 ENCOUNTER — Encounter (HOSPITAL_COMMUNITY): Payer: PPO

## 2015-07-18 ENCOUNTER — Other Ambulatory Visit: Payer: Self-pay

## 2015-07-18 NOTE — Patient Outreach (Signed)
[facility]) Care Management  07/18/2015  [name] 06/25/1945 [id number]  Telephonic Monthly Assessment Program: HTN 03/13/15   Outreach attempt to patient.  Contact states patient is not available but took a message.   MR Review:  H/o Neurology follow-up appt 06/25/15 with Ward Givens, MSN, NP-C, under Dr. Larey Seat, [facility] [phone].  H/o Seizures (none in 30 years) - continue Keppra.  Headaches - MD will continue to monitor, Numbness and tingling in the left hand.  MD planning I will complete a nerve conduction study with EMG.   Plan: RN CM left message with contact with name and number for call back.  RN CM will schedule for next outreach within one week.   [name], BSN, RN, [place] Management Care Management Coordinator [phone] Direct [phone] Cell [phone] Office [phone] Fax [name]

## 2015-07-21 ENCOUNTER — Other Ambulatory Visit: Payer: Self-pay

## 2015-07-21 ENCOUNTER — Encounter (HOSPITAL_COMMUNITY): Payer: Self-pay

## 2015-07-21 NOTE — Patient Outreach (Signed)
[facility]) Care Management  07/21/2015  [name] 10-Sep-1945 [id number]  Telephonic Monthly Assessment Program: HTN   Subjective:  Patient states improvement in her condition management.  States rehab services completed and was helpful.   Providers: Primary MD: Dr. Carol Ada - last appt:06/16/15 - next appt 08/2015 Pulmonologist: Dr. Brand Males - 05/20/15 [facility]. Dr. Lauris Chroman 05/2015 Nephrologist - 04/28/15 OP Pulmonary Rehab:completed HH: None   Psycho/Social: Patient lives in the home with her husband.  Mobility: Ambulating independently but has a walker if needed. Currently does not require use of walker and has started going to [employer] 3 times a week.    Falls: 2 over past year; Last fall date 03/01/15 requiring ED visit due to hitting head - CAT scan - neg.  Pain: none Depression: None Transportation: Husband Caregiver: Husband, [name] DME: walker, home BP cuff, home oxygen (provider: [facility] 2 L/m via [place] at night).   HTN, Lung Disease, Chronic kidney disease, stage III ER visits: 0 / Admissions: 3  Last admission 03/01/2015 - 03/03/2015 Syncope secondary to orthostatic hypotension and illness.  BP 126/62  Weight 148 lb Height 59 inches OP Rehab completed.  Patient pleased with results and reports improvement in her breathing, muscle strength and the way she feels.  States no issues with BP over the past month.  Patient states she has no further questions or concerns regarding her condition management at this time.  H/o [facility] Education mailed 04/02/2015 (Reviewed 05/05/2015, 06/20/15) -Diffuse Interstitial Lung Disease -Pulmonary Hypertension  -Low-Salt Diet -Know Before You Go - Your guide for where to go when you need medical care.    Medications:  Patient taking more than 10 medications  Co-pay cost issues: none ($0 - 12 dollars) Flu Vaccine: 10/09/14 PCV13:  01/15/2014 PPSV23: 11/23/2009 Medication reconciliation completed with patient on 07/21/2015 States stopped taking hydroxyzine due to side effect of sedation.  States hives resolved and no longer needs any medication management for itching.   Objective:   Encounter Medications:  Outpatient Encounter Prescriptions as of 07/21/2015  Medication Sig Note  . ALPRAZolam (XANAX) 0.25 MG tablet Take 0.25 mg by mouth at bedtime.   Marland Kitchen aspirin EC 81 MG tablet Take 81 mg by mouth daily.   . calcitRIOL (ROCALTROL) 0.5 MCG capsule Take 0.5 mcg by mouth daily.   . clopidogrel (PLAVIX) 75 MG tablet TAKE 1 TABLET (75 MG TOTAL) BY MOUTH DAILY WITH BREAKFAST.   Marland Kitchen diltiazem (MATZIM LA) 240 MG 24 hr tablet Take 240 mg by mouth every morning.    Marland Kitchen doxazosin (CARDURA) 2 MG tablet Take 2 mg by mouth daily. 03/17/2015: Received from: External Pharmacy Received Sig:   . furosemide (LASIX) 20 MG tablet TAKE 1 TABLET (20 MG TOTAL) BY MOUTH EVERY OTHER DAY.   . isosorbide mononitrate (IMDUR) 60 MG 24 hr tablet TAKE 1 AND 1/2 TABLETS BY MOUTH EVERY DAY (Patient taking differently: TAKE 1 AND 1/2 TABLETS= 90 mg BY MOUTH EVERY DAY)   . levETIRAcetam (KEPPRA) 500 MG tablet Take 1 tablet (500 mg total) by mouth 2 (two) times daily.   Marland Kitchen levothyroxine (SYNTHROID, LEVOTHROID) 100 MCG tablet Take 100 mcg by mouth daily before breakfast.   . losartan (COZAAR) 100 MG tablet Take 100 mg by mouth every morning.    . metoprolol succinate (TOPROL-XL) 50 MG 24 hr tablet Take 50 mg by mouth daily. Take with or immediately following a meal. 02/23/2015: .  . nitroGLYCERIN (  NITROSTAT) 0.4 MG SL tablet Place 1 tablet (0.4 mg total) under the tongue every 5 (five) minutes as needed for chest pain.   . pantoprazole (PROTONIX) 40 MG tablet TAKE 1 TABLET (40 MG TOTAL) BY MOUTH 2 (TWO) TIMES DAILY.   Marland Kitchen PARoxetine (PAXIL) 30 MG tablet Take 30 mg by mouth daily.   . potassium chloride SA (K-DUR,KLOR-CON) 20 MEQ tablet Take 1 tablet (20 mEq total) by  mouth every other day. Take with Lasix.   Marland Kitchen rosuvastatin (CRESTOR) 20 MG tablet Take 1 tablet (20 mg total) by mouth every morning.    No facility-administered encounter medications on file as of 07/21/2015.    Functional Status:  In your present state of health, do you have any difficulty performing the following activities: 03/13/2015 03/01/2015  Hearing? N N  Vision? N N  Difficulty concentrating or making decisions? N N  Walking or climbing stairs? N N  Dressing or bathing? N N  Doing errands, shopping? N N  Preparing Food and eating ? N -  Using the Toilet? N -  In the past six months, have you accidently leaked urine? N -  Do you have problems with loss of bowel control? N -  Managing your Medications? N -  Managing your Finances? N -  Housekeeping or managing your Housekeeping? N -    Fall/Depression Screening: PHQ 2/9 Scores 07/21/2015 05/05/2015 04/02/2015 03/17/2015 03/13/2015 08/09/2012 05/01/2012  PHQ - 2 Score 0 0 0 2 0 0 3  PHQ- 9 Score - 0 2 9 - 1 5   Fall Risk  07/21/2015 06/20/2015 05/05/2015 04/02/2015 03/17/2015  Falls in the past year? Yes Yes Yes Yes Yes  Number falls in past yr: 2 or more 2 or more 2 or more 2 or more 2 or more  Injury with Fall? No No No No No  Risk Factor Category  High Fall Risk High Fall Risk High Fall Risk High Fall Risk High Fall Risk  Risk for fall due to : History of fall(s) History of fall(s);Other (Comment) History of fall(s);Other (Comment) History of fall(s);Other (Comment) Other (Comment);Medication side effect  Risk for fall due to (comments): - H/o orthostatic Hypotension H/o orthostatic hypotension H/o orthostatic Hypotension orthostatic hypotension  Follow up Falls prevention discussed;Falls evaluation completed Falls evaluation completed;Education provided;Falls prevention discussed Falls evaluation completed;Education provided;Falls prevention discussed Falls evaluation completed;Education provided;Falls prevention discussed Education provided     Assessment: Condition stable with appropriate self management interventions.  Patient has progressed with learning new interventions and initiated regular exercise class.   Plan:  Referral Date: 02/28/15 Source: HTA Tier 4 Issue: ED and Hospital Admission utilization. DM. (Update:Patient does not have diabetes diagnosis). Screening 03/13/15 Initial Assessment: 04/02/15 Telephonic RN CM services: 03/13/15 - 07/21/15 Program: HTN 03/13/15 - 07/21/15  RN CM advised patient in case closure:  Goals of care met.  RN CM advised to please notify MD of any changes in condition prior to scheduled appt's.  -shortness of breath, fever, falls, other illnesses.  RN CM provided contact name and # [phone] or main office # [phone] and 24-hour nurse line # [phone].  RN CM confirmed patient is aware of 911 services for urgent emergency needs.  RN mailed patient Case Closure letter.  RN CM notified Dr. Tamala Julian via case closure letter.  RN CM notified [facility]:  Case closed.   Nathaneil Canary, BSN, RN, [place] Management Care Management Coordinator [phone] Direct [phone] Cell [phone] Office [phone] Fax [email or web address]

## 2015-07-21 NOTE — Progress Notes (Signed)
Discharge Summary  Patient Details  Name: [name] MRN: [id number] Date of Birth: Sep 23, 1945 Referring Provider:     Number of Visits: 23   Reason for Discharge:  Patient reached a stable level of exercise. Patient independent in their exercise.  Smoking History:  History  Smoking status  . Former Smoker -- 0.30 packs/day for 3 years  . Types: Cigarettes  . Start date: 11/08/1964  . Quit date: 11/09/1967  Smokeless tobacco  . Never Used    Diagnosis:  ILS  ADL UCSD:     Pulmonary Assessment Scores      06/19/15 1446       ADL UCSD   ADL Phase Exit     SOB Score total 59        Initial Exercise Prescription:   Discharge Exercise Prescription (Final Exercise Prescription Changes):     Exercise Prescription Changes - 06/19/15 1100    Response to Exercise   Blood Pressure (Admit) 120/50 mmHg   Blood Pressure (Exercise) 128/80 mmHg   Blood Pressure (Exit) 120/70 mmHg   Heart Rate (Admit) 85 bpm   Heart Rate (Exercise) 102 bpm   Heart Rate (Exit) 81 bpm   Oxygen Saturation (Admit) 98 %   Oxygen Saturation (Exercise) 98 %   Oxygen Saturation (Exit) 98 %   Rating of Perceived Exertion (Exercise) 11   Perceived Dyspnea (Exercise) 1   Duration Progress to 45 minutes of aerobic exercise without signs/symptoms of physical distress   Intensity THRR unchanged   Progression   Progression Continue to progress workloads to maintain intensity without signs/symptoms of physical distress.   Resistance Training   Training Prescription Yes   Weight orange bands   Reps 10-12   Rower   Level 1   Minutes 15   Track   Laps 15   Minutes 15      Functional Capacity:     6 Minute Walk      06/26/15 0712       6 Minute Walk   Phase Discharge     Distance 1300 feet     Walk Time 6 minutes     # of Rest Breaks 0     MPH 2.4     METS 2.7     RPE 11     Perceived Dyspnea  1     Symptoms No     Resting HR 87 bpm     Resting BP 100/56 mmHg     Max Ex.  HR 114 bpm     Max Ex. BP 108/60 mmHg     Interval HR   Baseline HR 87     1 Minute HR 97     2 Minute HR 112     3 Minute HR 114     4 Minute HR 112     5 Minute HR 109     6 Minute HR 110     2 Minute Post HR 90     Interval Heart Rate? Yes     Interval Oxygen   Interval Oxygen? Yes     Baseline Oxygen Saturation % 97 %     Baseline Liters of Oxygen 0 L     1 Minute Oxygen Saturation % 97 %     1 Minute Liters of Oxygen 0 L     2 Minute Oxygen Saturation % 97 %     2 Minute Liters of Oxygen 0 L     3 Minute Oxygen  Saturation % 98 %     3 Minute Liters of Oxygen 0 L     4 Minute Oxygen Saturation % 98 %     4 Minute Liters of Oxygen 0 L     5 Minute Oxygen Saturation % 97 %     5 Minute Liters of Oxygen 0 L     6 Minute Oxygen Saturation % 97 %     6 Minute Liters of Oxygen 0 L     2 Minute Post Oxygen Saturation % 98 %     2 Minute Post Liters of Oxygen 0 L        Psychological, QOL, Others - Outcomes: PHQ 2/9: Depression screen [facility] 2/9 05/05/2015 04/02/2015 03/17/2015 03/13/2015 08/09/2012  Decreased Interest 0 0 1 0 0  Down, Depressed, Hopeless 0 0 1 0 0  PHQ - 2 Score 0 0 2 0 0  Altered sleeping 0 1 3 - 0  Tired, decreased energy 0 1 3 - 1  Change in appetite 0 0 0 - 0  Feeling bad or failure about yourself  0 0 1 - 0  Trouble concentrating 0 0 0 - 0  Moving slowly or fidgety/restless 0 0 0 - 0  Suicidal thoughts 0 0 0 - 0  PHQ-9 Score 0 2 9 - 1  Difficult doing work/chores Not difficult at all Not difficult at all Not difficult at all - -    Quality of Life:     Quality of Life - 06/19/15 1445    Quality of Life Scores   Health/Function Post 19.87 %   Socioeconomic Post 23.25 %   Psych/Spiritual Post 23.14 %   Family Post 22 %   GLOBAL Post 21.58 %      Personal Goals: Goals established at orientation with interventions provided to work toward goal.    Personal Goals Discharge:     Goals and Risk Factor Review      05/29/15 0957 07/21/15 1042          Core Components/Risk Factors/Patient Goals Review   Personal Goals Review Increase Strength and Stamina;Improve shortness of breath with ADL's;Sedentary;Develop more efficient breathing techniques such as purse lipped breathing and diaphragmatic breathing and practicing self-pacing with activity.       Review see "comments" section on ITP Patient met all of her personal goals      Expected Outcomes See Admission outcomes          Nutrition & Weight - Outcomes:    Nutrition:   Nutrition Discharge:     Nutrition Assessments - 06/27/15 1203    Rate Your Plate Scores   Pre Score 50   Post Score 52      Education Questionnaire Score:     Knowledge Questionnaire Score - 07/21/15 1051    Knowledge Questionnaire Score   Pre Score 11/13      Goals reviewed with patient at discharge. Patient plans to continue her home exercise at a local gym.

## 2015-07-29 ENCOUNTER — Ambulatory Visit: Payer: PPO | Admitting: Adult Health

## 2015-07-31 ENCOUNTER — Encounter: Payer: Self-pay | Admitting: Neurology

## 2015-07-31 ENCOUNTER — Ambulatory Visit (INDEPENDENT_AMBULATORY_CARE_PROVIDER_SITE_OTHER): Payer: PPO | Admitting: Neurology

## 2015-07-31 ENCOUNTER — Ambulatory Visit (INDEPENDENT_AMBULATORY_CARE_PROVIDER_SITE_OTHER): Payer: Self-pay | Admitting: Neurology

## 2015-07-31 DIAGNOSIS — G5603 Carpal tunnel syndrome, bilateral upper limbs: Secondary | ICD-10-CM

## 2015-07-31 DIAGNOSIS — R200 Anesthesia of skin: Secondary | ICD-10-CM

## 2015-07-31 DIAGNOSIS — R202 Paresthesia of skin: Secondary | ICD-10-CM

## 2015-07-31 DIAGNOSIS — G5602 Carpal tunnel syndrome, left upper limb: Secondary | ICD-10-CM

## 2015-07-31 DIAGNOSIS — G5601 Carpal tunnel syndrome, right upper limb: Secondary | ICD-10-CM | POA: Diagnosis not present

## 2015-07-31 HISTORY — DX: Carpal tunnel syndrome, bilateral upper limbs: G56.03

## 2015-07-31 NOTE — Progress Notes (Signed)
Please refer to EMG nerve conduction study procedure note. The patient was given bilateral wrist splints to wear for mild bilateral carpal tunnel syndrome.

## 2015-07-31 NOTE — Procedures (Signed)
     HISTORY:  [name] is a 70 year old patient with a history of seizures who also reports a one-year history of numbness in both hands, left greater than right. The patient has a history of chronic neck pain, and some pain radiating down the left arm. The patient is being evaluated for a possible neuropathy or a cervical radiculopathy.  NERVE CONDUCTION STUDIES:  Nerve conduction studies were performed on both upper extremities. The distal motor latencies for the median nerves were prolonged bilaterally, with a borderline normal amplitude on the left, normal on the right. The distal motor latencies and motor amplitudes for the ulnar nerves were normal bilaterally. The F wave latencies and nerve conduction velocities for the median and ulnar nerves were within normal limits bilaterally. The sensory latencies for the median nerves were prolonged bilaterally, normal for the ulnar nerves bilaterally.  EMG STUDIES:  EMG study was performed on the left upper extremity:  The first dorsal interosseous muscle reveals 2 to 4 K units with full recruitment. No fibrillations or positive waves were noted. The abductor pollicis brevis muscle reveals 2 to 4 K units with slightly decreased recruitment. No fibrillations or positive waves were noted. The extensor indicis proprius muscle reveals 1 to 3 K units with full recruitment. No fibrillations or positive waves were noted. The pronator teres muscle reveals 2 to 3 K units with full recruitment. No fibrillations or positive waves were noted. The biceps muscle reveals 1 to 2 K units with full recruitment. No fibrillations or positive waves were noted. The triceps muscle reveals 2 to 4 K units with full recruitment. No fibrillations or positive waves were noted. The anterior deltoid muscle reveals 2 to 3 K units with full recruitment. No fibrillations or positive waves were noted. The cervical paraspinal muscles were tested at 2 levels. No abnormalities  of insertional activity were seen at either level tested. There was fair relaxation.   IMPRESSION:  Nerve conduction studies done on both upper extremities shows evidence of mild bilateral carpal tunnel syndrome. EMG evaluation of the left upper extremity shows no evidence of an overlying cervical radiculopathy.  Jill Alexanders MD 07/31/2015 4:19 PM  [facility] [address] [place], [place] [address]  Phone [phone] Fax [phone]

## 2015-08-02 DIAGNOSIS — J8410 Pulmonary fibrosis, unspecified: Secondary | ICD-10-CM | POA: Diagnosis not present

## 2015-08-02 DIAGNOSIS — J9611 Chronic respiratory failure with hypoxia: Secondary | ICD-10-CM | POA: Diagnosis not present

## 2015-08-02 DIAGNOSIS — R0602 Shortness of breath: Secondary | ICD-10-CM | POA: Diagnosis not present

## 2015-08-02 DIAGNOSIS — R531 Weakness: Secondary | ICD-10-CM | POA: Diagnosis not present

## 2015-08-20 ENCOUNTER — Ambulatory Visit (INDEPENDENT_AMBULATORY_CARE_PROVIDER_SITE_OTHER)
Admission: RE | Admit: 2015-08-20 | Discharge: 2015-08-20 | Disposition: A | Discharge status: Home / Self Care | Payer: PPO | Source: Ambulatory Visit | Attending: Internal Medicine | Admitting: Internal Medicine

## 2015-08-20 DIAGNOSIS — R0689 Other abnormalities of breathing: Secondary | ICD-10-CM

## 2015-08-20 DIAGNOSIS — J849 Interstitial pulmonary disease, unspecified: Secondary | ICD-10-CM

## 2015-08-20 DIAGNOSIS — R0602 Shortness of breath: Secondary | ICD-10-CM | POA: Diagnosis not present

## 2015-08-20 DIAGNOSIS — R0600 Dyspnea, unspecified: Secondary | ICD-10-CM

## 2015-08-21 DIAGNOSIS — F324 Major depressive disorder, single episode, in partial remission: Secondary | ICD-10-CM | POA: Diagnosis not present

## 2015-08-21 DIAGNOSIS — Z1211 Encounter for screening for malignant neoplasm of colon: Secondary | ICD-10-CM | POA: Diagnosis not present

## 2015-08-21 DIAGNOSIS — E039 Hypothyroidism, unspecified: Secondary | ICD-10-CM | POA: Diagnosis not present

## 2015-08-21 DIAGNOSIS — E7800 Pure hypercholesterolemia, unspecified: Secondary | ICD-10-CM | POA: Diagnosis not present

## 2015-08-21 DIAGNOSIS — Z0000 Encounter for general adult medical examination without abnormal findings: Secondary | ICD-10-CM | POA: Diagnosis not present

## 2015-08-21 DIAGNOSIS — I2510 Atherosclerotic heart disease of native coronary artery without angina pectoris: Secondary | ICD-10-CM | POA: Diagnosis not present

## 2015-08-21 DIAGNOSIS — N183 Chronic kidney disease, stage 3 (moderate): Secondary | ICD-10-CM | POA: Diagnosis not present

## 2015-08-21 DIAGNOSIS — Z1389 Encounter for screening for other disorder: Secondary | ICD-10-CM | POA: Diagnosis not present

## 2015-08-21 DIAGNOSIS — I10 Essential (primary) hypertension: Secondary | ICD-10-CM | POA: Diagnosis not present

## 2015-08-25 ENCOUNTER — Encounter: Payer: Self-pay | Admitting: Internal Medicine

## 2015-08-25 ENCOUNTER — Ambulatory Visit (INDEPENDENT_AMBULATORY_CARE_PROVIDER_SITE_OTHER): Payer: PPO | Admitting: Internal Medicine

## 2015-08-25 VITALS — BP 118/68 | HR 77 | Ht 59.0 in | Wt 152.0 lb

## 2015-08-25 DIAGNOSIS — R0600 Dyspnea, unspecified: Secondary | ICD-10-CM | POA: Diagnosis not present

## 2015-08-25 DIAGNOSIS — R0689 Other abnormalities of breathing: Secondary | ICD-10-CM | POA: Diagnosis not present

## 2015-08-25 NOTE — Patient Instructions (Signed)
ICD-9-CM ICD-10-CM   1. Dyspnea and respiratory abnormality 786.09 R06.00     R06.89     August 2017 CT does not show evidence of ILD; very reassuring Retest ONO on RA to see if you still need oxyugen Too bad stil short of breath  - too bad that breo did not help amd rehab /exercise did not help Please have PCP Reginia Naas, MD address your chronic anemia; this can make your short of breath  followup  4-6 months or sooner if needed  continue exercises in interim

## 2015-08-25 NOTE — Progress Notes (Signed)
Subjective:     Patient ID: [name], female   DOB: [birth year], 70 y.o.   MRN: [id number]  HPI  OV 02/17/2015  Chief Complaint  Patient presents with  . Follow-up    Pt here after lung biospy. Pt c/o right lateral chest pain from the surgery, c/o chest congestion with non prod cough. Pt states her SOB is unchanged since last OV.    70 year old obese woman referred for pulm fibrosis. Hx from patient (poor hx) and chart and husband.  Office visit 11/22/2014: Dyspnea - insidious onset x several years. Progressive steadily x few years. Severe. Brought on with ltd exertion like ADLs. Relieved by rest. No associated cough or wheeze. ECHO 10/15/14 - gr1 diast dysfn and per husband recently started on lasix without improvement. Had CTA 11/9/176 - PE ruled out but per report CT shows  ILD.  Non specific scar changes on most recent CT and new RUL nodule in 2015 and 2016.   PFT 11/18/14 shows restriction with low DLCO - suggestive of ILD - FVC 70%, TLC 69%, DLCO 52% (BMI 29.67 kg/(m^2)).   Walking desat test 185 feet x 3 laps - stopped due to dyspnea prematurely at 2 laps. Also fatigue - did not desat at 2 laps.  02/17/15: Underwent right VATS, mini thoracotomy for open lung biopsy 01/24/2015 with Dr. Prescott Gum. Path with focal mild nonspecific chronic bronchiolitis and pleuritis with granulomatous features. Dyspnea improved since surgery but not back to baseline. Cough with chest congestion but unable to bring up. Pain improved from surgery. No fevers or chills. Occasional chronic chest pain. Some wheezing. She uses 2L O2 at night.   OV 05/20/2015  Chief Complaint  Patient presents with  . Follow-up    Pt states she feels her breathing has worsened since last OV. Pt c/o slight increase in SOB with activity, mild dry cough and has occassional chest discomfort - this resolves with rest.     Follow-up dyspnea in the setting of restrictive PFTs and ILD pattern and CT chest. Surgical lung biopsy without  specific ILD diagnosis  After last visit I referred her to pulmonary habitation which she says has helped her dyspnea. She did see [facility] Dr. Lauris Chroman in April 2017 and apparently has been reassured. I reviewed his notes and he did want to review her biopsy of the multidisciplinary thoracic ILD clinic. I do not have the conclusion of this but it appears from the patient and her husband who both are poor historians that they were reassured. They were even told that she might have some emphysema. In February 2017 she had admission for nonspecific weakness. At discharge she was given some albuterol which apparently helped. She is wanting to try the same  There are no other new issues.    OV 08/25/2015  Chief Complaint  Patient presents with  . Follow-up    Pt states that she does not feel she is improving. Pt has been exercising 3 days a week. Pt c/o an increase in SOB and wheeze with exertion. Pt denies cough/cp/tightness.     Follow-up dyspnea in the setting of restrictive PFTs and nocturnal desaturation with 2016 CT suggestive of ILD surgical lung biopsy without specific diagnosis.  She returns with her husband. In April 2017 basal ILD clinic at [facility] and reassured. Since then she is attended pulmonary rehabilitation he did she tells me this does not help. She is currently working out a plan at fitness and this is  not helping her. She still dyspneic with exertion relieved by rest. She did try Brio inhaler that I gave her and this did not help her either. There are no other new symptoms. Review of the chart shows she has chronic anemia hemoglobin 9 g percent at least since January 2017. A year ago she was a little bit higher than that. Most recently apparently primary care physician checked her blood hemoglobin and it was low at 9 g percent. She does not know the cause. She never seen hematology. She was blood transfusions a few years ago. Let us high-resolution CT chest  August 2017 read by thoracic radiologist suggested is no ILD anymore.   Review of Systems     Objective:   Physical Exam  Constitutional: She is oriented to person, place, and time. She appears well-developed and well-nourished. No distress.  HENT:  Head: Normocephalic and atraumatic.  Right Ear: External ear normal.  Left Ear: External ear normal.  Mouth/Throat: Oropharynx is clear and moist. No oropharyngeal exudate.  Eyes: Conjunctivae and EOM are normal. Pupils are equal, round, and reactive to light. Right eye exhibits no discharge. Left eye exhibits no discharge. No scleral icterus.  Neck: Normal range of motion. Neck supple. No JVD present. No tracheal deviation present. No thyromegaly present.  Cardiovascular: Normal rate, regular rhythm, normal heart sounds and intact distal pulses.  Exam reveals no gallop and no friction rub.   No murmur heard. Pulmonary/Chest: Effort normal and breath sounds normal. No respiratory distress. She has no wheezes. She has no rales. She exhibits no tenderness.  Abdominal: Soft. Bowel sounds are normal. She exhibits no distension and no mass. There is no tenderness. There is no rebound and no guarding.  Musculoskeletal: Normal range of motion. She exhibits no edema or tenderness.  Lymphadenopathy:    She has no cervical adenopathy.  Neurological: She is alert and oriented to person, place, and time. She has normal reflexes. No cranial nerve deficit. She exhibits normal muscle tone. Coordination normal.  Skin: Skin is warm and dry. No rash noted. She is not diaphoretic. No erythema. No pallor.  Psychiatric: She has a normal mood and affect. Her behavior is normal. Judgment and thought content normal.  Vitals reviewed.   Vitals:   08/25/15 1030  BP: 118/68  Pulse: 77  SpO2: 94%  Weight: 152 lb (68.9 kg)  Height: 4\' 11"  (1.499 m)        Assessment:       ICD-9-CM ICD-10-CM   1. Dyspnea and respiratory abnormality 786.09 R06.00 Pulse  oximetry, overnight    R06.89        Plan:     Initially presumed due to ILD but current CT and surgical lung biopsy ruled out. Brio inhaler has failed to help her. Rehabilitation was failed to help her. She is anemic. Therefore this needs to be addressed. I was planning to pulmonary stress test but given her anemia told her and her husband that would need anemia addressed. I will send this note to primary care physician Reginia Naas, MD   (> 50% of this 15 min visit spent in face to face counseling or/and coordination of care)   Dr. Brand Males, M.D., [facility].C.P Pulmonary and Critical Care Medicine Staff Physician [place] [facility] Pager: [phone], If no answer or between  15:00h - 7:00h: call [phone]  08/25/2015 10:53 AM

## 2015-08-27 DIAGNOSIS — D631 Anemia in chronic kidney disease: Secondary | ICD-10-CM | POA: Diagnosis not present

## 2015-08-27 DIAGNOSIS — E889 Metabolic disorder, unspecified: Secondary | ICD-10-CM | POA: Diagnosis not present

## 2015-08-27 DIAGNOSIS — I129 Hypertensive chronic kidney disease with stage 1 through stage 4 chronic kidney disease, or unspecified chronic kidney disease: Secondary | ICD-10-CM | POA: Diagnosis not present

## 2015-08-27 DIAGNOSIS — M9080 Osteopathy in diseases classified elsewhere, unspecified site: Secondary | ICD-10-CM | POA: Diagnosis not present

## 2015-08-27 DIAGNOSIS — N183 Chronic kidney disease, stage 3 (moderate): Secondary | ICD-10-CM | POA: Diagnosis not present

## 2015-08-27 DIAGNOSIS — E559 Vitamin D deficiency, unspecified: Secondary | ICD-10-CM | POA: Diagnosis not present

## 2015-09-01 DIAGNOSIS — D631 Anemia in chronic kidney disease: Secondary | ICD-10-CM | POA: Diagnosis not present

## 2015-09-01 DIAGNOSIS — N200 Calculus of kidney: Secondary | ICD-10-CM | POA: Diagnosis not present

## 2015-09-01 DIAGNOSIS — R3129 Other microscopic hematuria: Secondary | ICD-10-CM | POA: Diagnosis not present

## 2015-09-01 DIAGNOSIS — M9080 Osteopathy in diseases classified elsewhere, unspecified site: Secondary | ICD-10-CM | POA: Diagnosis not present

## 2015-09-01 DIAGNOSIS — I129 Hypertensive chronic kidney disease with stage 1 through stage 4 chronic kidney disease, or unspecified chronic kidney disease: Secondary | ICD-10-CM | POA: Diagnosis not present

## 2015-09-01 DIAGNOSIS — E889 Metabolic disorder, unspecified: Secondary | ICD-10-CM | POA: Diagnosis not present

## 2015-09-01 DIAGNOSIS — N183 Chronic kidney disease, stage 3 (moderate): Secondary | ICD-10-CM | POA: Diagnosis not present

## 2015-09-01 DIAGNOSIS — E559 Vitamin D deficiency, unspecified: Secondary | ICD-10-CM | POA: Diagnosis not present

## 2015-09-01 DIAGNOSIS — D649 Anemia, unspecified: Secondary | ICD-10-CM | POA: Diagnosis not present

## 2015-09-02 ENCOUNTER — Encounter: Payer: Self-pay | Admitting: Internal Medicine

## 2015-09-02 ENCOUNTER — Other Ambulatory Visit: Payer: Self-pay | Admitting: Cardiology

## 2015-09-02 DIAGNOSIS — J9611 Chronic respiratory failure with hypoxia: Secondary | ICD-10-CM | POA: Diagnosis not present

## 2015-09-02 DIAGNOSIS — J8410 Pulmonary fibrosis, unspecified: Secondary | ICD-10-CM | POA: Diagnosis not present

## 2015-09-02 DIAGNOSIS — R0602 Shortness of breath: Secondary | ICD-10-CM | POA: Diagnosis not present

## 2015-09-02 DIAGNOSIS — R531 Weakness: Secondary | ICD-10-CM | POA: Diagnosis not present

## 2015-09-03 DIAGNOSIS — Z1211 Encounter for screening for malignant neoplasm of colon: Secondary | ICD-10-CM | POA: Diagnosis not present

## 2015-09-05 ENCOUNTER — Telehealth: Payer: Self-pay | Admitting: Internal Medicine

## 2015-09-05 NOTE — Telephone Encounter (Signed)
  Not sure who ordered ONO and why. Done 09/02/15 - pulse ox </= 88% at 34 min - can do 1L O2 at night if she wants   [place] [id number]

## 2015-09-05 NOTE — Telephone Encounter (Signed)
lmtcb for pt.  

## 2015-09-08 NOTE — Telephone Encounter (Signed)
Called and spoke to pt. Informed her of the recs per MR. Pt verbalized understanding and denied any further questions or concerns at this time.   

## 2015-09-08 NOTE — Telephone Encounter (Signed)
Continue but reduc eto  1L [place] [facility]  Dr. Brand Males, M.D., [facility].C.P Pulmonary and Critical Care Medicine Staff Physician [place] [facility] Pager: [phone], If no answer or between  15:00h - 7:00h: call [phone]  09/08/2015 11:52 AM

## 2015-09-08 NOTE — Telephone Encounter (Signed)
Spoke with pt.  She states that she did have ONO off oxygen recently.  PT states that she is already on oxygen 2L at night.  MR, do you want her to continue this or any changes?

## 2015-09-08 NOTE — Telephone Encounter (Signed)
Patient returned call [phone].  States she will be out for a while.

## 2015-09-30 ENCOUNTER — Ambulatory Visit (INDEPENDENT_AMBULATORY_CARE_PROVIDER_SITE_OTHER): Payer: PPO | Admitting: Neurology

## 2015-09-30 ENCOUNTER — Encounter: Payer: Self-pay | Admitting: Neurology

## 2015-09-30 VITALS — BP 133/87 | HR 59 | Ht <= 58 in | Wt 151.0 lb

## 2015-09-30 DIAGNOSIS — R569 Unspecified convulsions: Secondary | ICD-10-CM

## 2015-09-30 DIAGNOSIS — M6530 Trigger finger, unspecified finger: Secondary | ICD-10-CM

## 2015-09-30 DIAGNOSIS — G5603 Carpal tunnel syndrome, bilateral upper limbs: Secondary | ICD-10-CM

## 2015-09-30 HISTORY — DX: Trigger finger, unspecified finger: M65.30

## 2015-09-30 NOTE — Progress Notes (Signed)
Reason for visit: Seizures  [name] is an 70 y.o. female  History of present illness:  [name] is a 70 year old left-handed white female with a history of seizures. The patient has not had any seizures in many years, she is on Keppra taking 500 mg twice daily, tolerating the medication well. The patient was recently seen for numbness in both hands, she was found to have mild bilateral carpal tunnel syndrome. The patient also has a "trigger finger" on the right middle finger that has been present for greater than one year. This is also causing her some discomfort in the hand. She has been followed for an iron deficiency anemia, on iron supplementation. She returns for an evaluation.  Past Medical History:  Diagnosis Date  . Anginal pain ([place])    over yr last time  . Anxiety   . Arthritis   . Carpal tunnel syndrome   . Carpal tunnel syndrome, bilateral 07/31/2015  . Cervical spondylosis without myelopathy   . Chronic kidney disease   . Chronic low back pain   . Coronary artery disease CARDIOLOGIST-  DR Irish Lack   hx NSTEMI inferoposterior 2011 s/p PCI  total x3 DES's  . Depression   . GERD (gastroesophageal reflux disease)   . Gross hematuria   . Heart murmur   . History of esophageal dilatation    FOR STRIUCTURE  . History of gout   . History of hiatal hernia   . History of kidney stones   . History of non-ST elevation myocardial infarction (NSTEMI)    2011--  S/P PCI WITH SENTING  . History of supraventricular tachycardia    2011-- resolved  . Hyperlipidemia   . Hypertension   . Hypothyroidism   . Internal hemorrhoids   . Irritable bowel syndrome   . Partial seizure disorder ([facility]) NEUROLOGIST-- DR Retaj Hilbun   NOCTURNAL  . PONV (postoperative nausea and vomiting)    hard time getting iv site-had to do neck stick 2 yrs ago  . Pulmonary fibrosis ([place])   . S/P drug eluting coronary stent placement    X3  in 2011, 2012, 2014  . S/P pericardial cyst excision    02-05-2013  benign  . Seizures ([place])    none in yrs  . Shortness of breath dyspnea   . Vitamin D deficiency     Past Surgical History:  Procedure Laterality Date  . BIOPSY OF MEDIASTINAL MASS N/A 02/05/2013   Procedure: RESECTION OF MEDIASTINAL MASS;  Surgeon: Jasmine Poot, MD;  Location: [place];  Service: Thoracic;  Laterality: N/A;  . CARDIAC CATHETERIZATION Right 01/24/2015   Procedure: right femoral ARTERIAL LINE INSERTION;  Surgeon: Jasmine Poot, MD;  Location: [place];  Service: Thoracic;  Laterality: Right;  . CARDIOVASCULAR STRESS TEST  11-22-2013  dr Irish Lack   normal lexiscan study/  no ischemia/  normal LVF and wall motion/ ef 81%  . COLONOSCOPY  last one 2014  . CORONARY ANGIOGRAM  03/10/2011   Procedure: CORONARY ANGIOGRAM;  Surgeon: Jettie Booze, MD;  Location[facility];  Service: Cardiovascular;;  . CORONARY ANGIOPLASTY WITH STENT PLACEMENT  11-22-2009  dr Irish Lack   Acute inferoposterior MI/  ef 60%,  PCI with DES x1 to dRCA,  25%  mLAD  . CORONARY ANGIOPLASTY WITH STENT PLACEMENT  06-26-2010  dr Daneen Schick   Cutting balloon angioplasty to dRCA with DES x1,  40% mLAD (non-obstructive CAD)  . CYSTOSCOPY WITH RETROGRADE PYELOGRAM, URETEROSCOPY AND STENT  PLACEMENT Right 02/13/2014   Procedure: CYSTOSCOPY WITH RETROGRADE PYELOGRAM, URETEROSCOPY AND STENT PLACEMENT;  Surgeon: Jasmine Amass, MD;  Location: [facility];  Service: Urology;  Laterality: Right;  . ECTOPIC PREGNANCY SURGERY  YRS AGO   SALPINGECTOMY  . ESOPHAGOGASTRODUODENOSCOPY N/A 02/15/2014   Procedure: ESOPHAGOGASTRODUODENOSCOPY (EGD);  Surgeon: Jasmine Bears, MD;  Location: [facility];  Service: Endoscopy;  Laterality: N/A;  . ESOPHAGOGASTRODUODENOSCOPY (EGD) WITH ESOPHAGEAL DILATION  05-20-2011  . HOLMIUM LASER APPLICATION Right [id number]   Procedure: HOLMIUM LASER APPLICATION;  Surgeon: Jasmine Amass, MD;  Location: [facility];  Service: Urology;  Laterality: Right;  .  LEFT HEART CATHETERIZATION WITH CORONARY ANGIOGRAM N/A 03/14/2012   Procedure: LEFT HEART CATHETERIZATION WITH CORONARY ANGIOGRAM;  Surgeon: Jasmine Margarita, MD;  Location: [place] CATH LAB;  Service: Cardiovascular;  Laterality: N/A;  Normal LM,  50% pLAD,  70% mLAD,  D2 50-70%, very tortuous LAD,  70-82% ostial LCFX,  50% in-stent restenosis of  dRCA and mRCA  stent and 50-70% ostial PDA,  normal LVSF, ef 60%  . LUNG BIOPSY Right 01/24/2015   Procedure: RIGHT LUNG BIOPSY;  Surgeon: Jasmine Poot, MD;  Location: [place];  Service: Thoracic;  Laterality: Right;  . MEDIASTERNOTOMY N/A 02/05/2013   Procedure: MEDIAN STERNOTOMY;  Surgeon: Jasmine Poot, MD;  Location: [place];  Service: Thoracic;  Laterality: N/A;  . NEEDLE GUIDED EXCISION BREAST CALCIFICATIONS Right 08-09-2008  . PERCUTANEOUS CORONARY STENT INTERVENTION (PCI-S) N/A 04/03/2012   Procedure: PERCUTANEOUS CORONARY STENT INTERVENTION (PCI-S);  Surgeon: Jettie Booze, MD;  Location: [facility] CATH LAB;  Service: Cardiovascular;  Laterality: N/A;   Successful PCI  mLAD with 2.75x12 Promus stent, postdilated to >0.81mm  . THORACOTOMY Right 01/24/2015   Procedure: RIGHT MINI/LIMITED THORACOTOMY;  Surgeon: Jasmine Poot, MD;  Location: [place];  Service: Thoracic;  Laterality: Right;  . TRANSTHORACIC ECHOCARDIOGRAM  11-17-2011   grade I diastolic dysfunction/  ef 55-60%  . VIDEO BRONCHOSCOPY N/A 01/24/2015   Procedure: RIGHT VIDEO BRONCHOSCOPY;  Surgeon: Jasmine Poot, MD;  Location: [facility] OR;  Service: Thoracic;  Laterality: N/A;    Family History  Problem Relation Age of Onset  . Breast cancer Sister   . Cancer Sister     breast  . Heart disease Father   . Hypertension Father   . Emphysema Father   . Coronary artery disease Father   . Diabetes Mellitus I Father   . Heart attack Mother   . CVA Mother   . Breast cancer      niece  . Colon cancer Neg Hx     Social history:  reports that she quit smoking about 47 years ago. Her smoking use  included Cigarettes. She started smoking about 50 years ago. She has a 0.90 pack-year smoking history. She has never used smokeless tobacco. She reports that she does not drink alcohol or use drugs.    Allergies  Allergen Reactions  . Tape Rash    ? Clear tape    Medications:  Prior to Admission medications   Medication Sig Start Date End Date Taking? Authorizing Provider  ALPRAZolam (XANAX) 0.25 MG tablet Take 0.25 mg by mouth at bedtime.   Yes Historical Provider, MD  aspirin EC 81 MG tablet Take 81 mg by mouth daily.   Yes Historical Provider, MD  calcitRIOL (ROCALTROL) 0.5 MCG capsule Take 0.5 mcg by mouth daily.   Yes Historical Provider, MD  clopidogrel (PLAVIX) 75 MG tablet TAKE  1 TABLET (75 MG TOTAL) BY MOUTH DAILY WITH BREAKFAST. 07/09/15  Yes Jasmine Margarita, MD  diltiazem (MATZIM LA) 240 MG 24 hr tablet Take 240 mg by mouth every morning.    Yes Historical Provider, MD  doxazosin (CARDURA) 2 MG tablet Take 2 mg by mouth daily. 02/27/15  Yes Historical Provider, MD  doxazosin (CARDURA) 2 MG tablet TAKE 1 TABLET (2 MG TOTAL) BY MOUTH DAILY. 09/04/15  Yes Jasmine Margarita, MD  ferrous sulfate 325 (65 FE) MG tablet Take 325 mg by mouth.   Yes Historical Provider, MD  furosemide (LASIX) 20 MG tablet TAKE 1 TABLET (20 MG TOTAL) BY MOUTH EVERY OTHER DAY. 07/09/15  Yes Jasmine Margarita, MD  HYDROcodone-acetaminophen (NORCO) 10-325 MG tablet Take 1 tablet by mouth every 6 (six) hours as needed.   Yes Historical Provider, MD  isosorbide mononitrate (IMDUR) 60 MG 24 hr tablet TAKE 1 AND 1/2 TABLETS BY MOUTH EVERY DAY Patient taking differently: TAKE 1 AND 1/2 TABLETS= 90 mg BY MOUTH EVERY DAY 12/24/14  Yes Jasmine Margarita, MD  levETIRAcetam (KEPPRA) 500 MG tablet Take 1 tablet (500 mg total) by mouth 2 (two) times daily. 06/25/15  Yes Ward Givens, NP  levothyroxine (SYNTHROID, LEVOTHROID) 112 MCG tablet Take 112 mcg by mouth daily. 06/25/15  Yes Historical Provider, MD  losartan (COZAAR) 100 MG  tablet Take 100 mg by mouth every morning.    Yes Historical Provider, MD  metoprolol succinate (TOPROL-XL) 50 MG 24 hr tablet Take 50 mg by mouth daily. Take with or immediately following a meal.   Yes Historical Provider, MD  nitroGLYCERIN (NITROSTAT) 0.4 MG SL tablet Place 1 tablet (0.4 mg total) under the tongue every 5 (five) minutes as needed for chest pain. 06/27/14  Yes Jasmine Margarita, MD  pantoprazole (PROTONIX) 40 MG tablet TAKE 1 TABLET (40 MG TOTAL) BY MOUTH 2 (TWO) TIMES DAILY. 10/30/14  Yes Irene Shipper, MD  PARoxetine (PAXIL) 30 MG tablet Take 30 mg by mouth daily. 11/17/13  Yes Historical Provider, MD  potassium chloride SA (K-DUR,KLOR-CON) 20 MEQ tablet Take 1 tablet (20 mEq total) by mouth every other day. Take with Lasix. 02/26/15  Yes [name], MD  rosuvastatin (CRESTOR) 20 MG tablet Take 1 tablet (20 mg total) by mouth every morning. 11/20/14  Yes Jasmine Margarita, MD    ROS:  Out of a complete 14 system review of symptoms, the patient complains only of the following symptoms, and all other reviewed systems are negative.  Hand numbness  Blood pressure 133/87, pulse (!) 59, height 4\' 10"  (1.473 m), weight 151 lb (68.5 kg).  Physical Exam  General: The patient is alert and cooperative at the time of the examination. The patient is moderately obese.  Skin: No significant peripheral edema is noted. A trigger finger is noted on the right middle finger.   Neurologic Exam  Mental status: The patient is alert and oriented x 3 at the time of the examination. The patient has apparent normal recent and remote memory, with an apparently normal attention span and concentration ability.   Cranial nerves: Facial symmetry is present. Speech is normal, no aphasia or dysarthria is noted. Extraocular movements are full. Visual fields are full.  Motor: The patient has good strength in all 4 extremities.  Sensory examination: Soft touch sensation is symmetric on the face, arms, and  legs.  Coordination: The patient has good finger-nose-finger and heel-to-shin bilaterally.  Gait and station: The patient has  a normal gait. Tandem gait is unsteady. Romberg is negative. No drift is seen.  Reflexes: Deep tendon reflexes are symmetric.   Assessment/Plan:  1. History of seizures, well controlled  2. Bilateral carpal tunnel syndrome, mild  3. Right middle finger trigger finger  The patient does not wish to have a referral to a hand surgeon at this time. She will contact me if she changes her mind. Otherwise, she will follow-up in one year. She will continue the [place].  Jill Alexanders MD 09/30/2015 10:38 AM  [facility] [address] [place], [place] [address]  Phone [phone] Fax [phone]

## 2015-10-01 DIAGNOSIS — M5030 Other cervical disc degeneration, unspecified cervical region: Secondary | ICD-10-CM | POA: Diagnosis not present

## 2015-10-01 DIAGNOSIS — Z79891 Long term (current) use of opiate analgesic: Secondary | ICD-10-CM | POA: Diagnosis not present

## 2015-10-01 DIAGNOSIS — G894 Chronic pain syndrome: Secondary | ICD-10-CM | POA: Diagnosis not present

## 2015-10-01 DIAGNOSIS — M5136 Other intervertebral disc degeneration, lumbar region: Secondary | ICD-10-CM | POA: Diagnosis not present

## 2015-10-02 DIAGNOSIS — Z23 Encounter for immunization: Secondary | ICD-10-CM | POA: Diagnosis not present

## 2015-10-03 DIAGNOSIS — J9611 Chronic respiratory failure with hypoxia: Secondary | ICD-10-CM | POA: Diagnosis not present

## 2015-10-03 DIAGNOSIS — R0602 Shortness of breath: Secondary | ICD-10-CM | POA: Diagnosis not present

## 2015-10-03 DIAGNOSIS — R531 Weakness: Secondary | ICD-10-CM | POA: Diagnosis not present

## 2015-10-03 DIAGNOSIS — J8410 Pulmonary fibrosis, unspecified: Secondary | ICD-10-CM | POA: Diagnosis not present

## 2015-10-05 ENCOUNTER — Other Ambulatory Visit: Payer: Self-pay | Admitting: Cardiology

## 2015-11-02 DIAGNOSIS — R0602 Shortness of breath: Secondary | ICD-10-CM | POA: Diagnosis not present

## 2015-11-02 DIAGNOSIS — J9611 Chronic respiratory failure with hypoxia: Secondary | ICD-10-CM | POA: Diagnosis not present

## 2015-11-02 DIAGNOSIS — R531 Weakness: Secondary | ICD-10-CM | POA: Diagnosis not present

## 2015-11-02 DIAGNOSIS — J8410 Pulmonary fibrosis, unspecified: Secondary | ICD-10-CM | POA: Diagnosis not present

## 2015-11-18 DIAGNOSIS — Z1159 Encounter for screening for other viral diseases: Secondary | ICD-10-CM | POA: Diagnosis not present

## 2015-11-18 DIAGNOSIS — D508 Other iron deficiency anemias: Secondary | ICD-10-CM | POA: Diagnosis not present

## 2015-11-18 DIAGNOSIS — E7800 Pure hypercholesterolemia, unspecified: Secondary | ICD-10-CM | POA: Diagnosis not present

## 2015-11-18 DIAGNOSIS — N183 Chronic kidney disease, stage 3 (moderate): Secondary | ICD-10-CM | POA: Diagnosis not present

## 2015-11-18 DIAGNOSIS — E039 Hypothyroidism, unspecified: Secondary | ICD-10-CM | POA: Diagnosis not present

## 2015-11-18 DIAGNOSIS — I2510 Atherosclerotic heart disease of native coronary artery without angina pectoris: Secondary | ICD-10-CM | POA: Diagnosis not present

## 2015-11-18 DIAGNOSIS — K219 Gastro-esophageal reflux disease without esophagitis: Secondary | ICD-10-CM | POA: Diagnosis not present

## 2015-11-18 DIAGNOSIS — F329 Major depressive disorder, single episode, unspecified: Secondary | ICD-10-CM | POA: Diagnosis not present

## 2015-11-18 DIAGNOSIS — I10 Essential (primary) hypertension: Secondary | ICD-10-CM | POA: Diagnosis not present

## 2015-11-25 ENCOUNTER — Telehealth: Payer: Self-pay | Admitting: Internal Medicine

## 2015-11-25 DIAGNOSIS — J040 Acute laryngitis: Secondary | ICD-10-CM | POA: Diagnosis not present

## 2015-11-25 DIAGNOSIS — J320 Chronic maxillary sinusitis: Secondary | ICD-10-CM | POA: Diagnosis not present

## 2015-11-25 DIAGNOSIS — J322 Chronic ethmoidal sinusitis: Secondary | ICD-10-CM | POA: Diagnosis not present

## 2015-11-26 ENCOUNTER — Other Ambulatory Visit: Payer: Self-pay | Admitting: Internal Medicine

## 2015-11-26 NOTE — Telephone Encounter (Signed)
Refilled Pantoprazole.  Patient will call back to schedule office visit for her and her husband

## 2015-11-27 DIAGNOSIS — D509 Iron deficiency anemia, unspecified: Secondary | ICD-10-CM | POA: Diagnosis not present

## 2015-11-27 DIAGNOSIS — I129 Hypertensive chronic kidney disease with stage 1 through stage 4 chronic kidney disease, or unspecified chronic kidney disease: Secondary | ICD-10-CM | POA: Diagnosis not present

## 2015-11-27 DIAGNOSIS — N183 Chronic kidney disease, stage 3 (moderate): Secondary | ICD-10-CM | POA: Diagnosis not present

## 2015-11-27 DIAGNOSIS — M65331 Trigger finger, right middle finger: Secondary | ICD-10-CM | POA: Diagnosis not present

## 2015-12-02 ENCOUNTER — Other Ambulatory Visit: Payer: Self-pay | Admitting: Cardiology

## 2015-12-02 DIAGNOSIS — M9080 Osteopathy in diseases classified elsewhere, unspecified site: Secondary | ICD-10-CM | POA: Diagnosis not present

## 2015-12-02 DIAGNOSIS — E889 Metabolic disorder, unspecified: Secondary | ICD-10-CM | POA: Diagnosis not present

## 2015-12-02 DIAGNOSIS — I129 Hypertensive chronic kidney disease with stage 1 through stage 4 chronic kidney disease, or unspecified chronic kidney disease: Secondary | ICD-10-CM | POA: Diagnosis not present

## 2015-12-02 DIAGNOSIS — N183 Chronic kidney disease, stage 3 (moderate): Secondary | ICD-10-CM | POA: Diagnosis not present

## 2015-12-02 DIAGNOSIS — N200 Calculus of kidney: Secondary | ICD-10-CM | POA: Diagnosis not present

## 2015-12-02 DIAGNOSIS — R3129 Other microscopic hematuria: Secondary | ICD-10-CM | POA: Diagnosis not present

## 2015-12-02 DIAGNOSIS — D631 Anemia in chronic kidney disease: Secondary | ICD-10-CM | POA: Diagnosis not present

## 2015-12-02 DIAGNOSIS — E559 Vitamin D deficiency, unspecified: Secondary | ICD-10-CM | POA: Diagnosis not present

## 2015-12-03 DIAGNOSIS — J8410 Pulmonary fibrosis, unspecified: Secondary | ICD-10-CM | POA: Diagnosis not present

## 2015-12-03 DIAGNOSIS — R531 Weakness: Secondary | ICD-10-CM | POA: Diagnosis not present

## 2015-12-03 DIAGNOSIS — J9611 Chronic respiratory failure with hypoxia: Secondary | ICD-10-CM | POA: Diagnosis not present

## 2015-12-03 DIAGNOSIS — R0602 Shortness of breath: Secondary | ICD-10-CM | POA: Diagnosis not present

## 2015-12-08 ENCOUNTER — Ambulatory Visit: Payer: PPO | Admitting: Cardiology

## 2015-12-08 DIAGNOSIS — E039 Hypothyroidism, unspecified: Secondary | ICD-10-CM | POA: Diagnosis not present

## 2015-12-08 DIAGNOSIS — E1165 Type 2 diabetes mellitus with hyperglycemia: Secondary | ICD-10-CM | POA: Diagnosis not present

## 2015-12-09 DIAGNOSIS — Z01419 Encounter for gynecological examination (general) (routine) without abnormal findings: Secondary | ICD-10-CM | POA: Diagnosis not present

## 2015-12-09 DIAGNOSIS — Z6831 Body mass index (BMI) 31.0-31.9, adult: Secondary | ICD-10-CM | POA: Diagnosis not present

## 2015-12-09 DIAGNOSIS — Z1231 Encounter for screening mammogram for malignant neoplasm of breast: Secondary | ICD-10-CM | POA: Diagnosis not present

## 2015-12-09 DIAGNOSIS — Z124 Encounter for screening for malignant neoplasm of cervix: Secondary | ICD-10-CM | POA: Diagnosis not present

## 2015-12-15 DIAGNOSIS — E039 Hypothyroidism, unspecified: Secondary | ICD-10-CM | POA: Diagnosis not present

## 2015-12-15 DIAGNOSIS — E1165 Type 2 diabetes mellitus with hyperglycemia: Secondary | ICD-10-CM | POA: Diagnosis not present

## 2015-12-15 DIAGNOSIS — E200 Idiopathic hypoparathyroidism: Secondary | ICD-10-CM | POA: Diagnosis not present

## 2015-12-15 DIAGNOSIS — I10 Essential (primary) hypertension: Secondary | ICD-10-CM | POA: Diagnosis not present

## 2015-12-25 DIAGNOSIS — M65331 Trigger finger, right middle finger: Secondary | ICD-10-CM | POA: Diagnosis not present

## 2015-12-31 ENCOUNTER — Ambulatory Visit (INDEPENDENT_AMBULATORY_CARE_PROVIDER_SITE_OTHER): Payer: PPO | Admitting: Internal Medicine

## 2015-12-31 ENCOUNTER — Encounter: Payer: Self-pay | Admitting: Internal Medicine

## 2015-12-31 ENCOUNTER — Ambulatory Visit: Payer: PPO | Admitting: Internal Medicine

## 2015-12-31 VITALS — BP 112/76 | HR 66 | Ht <= 58 in | Wt 157.0 lb

## 2015-12-31 DIAGNOSIS — R0600 Dyspnea, unspecified: Secondary | ICD-10-CM | POA: Diagnosis not present

## 2015-12-31 DIAGNOSIS — R0689 Other abnormalities of breathing: Secondary | ICD-10-CM

## 2015-12-31 LAB — NITRIC OXIDE: Nitric Oxide: 6

## 2015-12-31 NOTE — Patient Instructions (Addendum)
ICD-9-CM ICD-10-CM   1. Dyspnea and respiratory abnormality 786.09 R06.00     R06.89    Glad anemia is better but too bad you are not feeling better  No evidence of asthma on Nitric Oxide test  Unclear why short of breath and why wheezing: but typpically weight, physical deconditioning and stiff heart muscle (called diastolic dysfunction) are playing a role   PLAN - nurse to call PCP Reginia Naas, MD and get latest hgb result - pulmonary stress test on bike with EIB challenge - in 2018  - ROV with me after that

## 2015-12-31 NOTE — Addendum Note (Signed)
Addended by: Collier Salina on: 12/31/2015 09:34 AM   Modules accepted: Orders

## 2015-12-31 NOTE — Progress Notes (Signed)
Subjective:     Patient ID: [name], female   DOB: 1945/04/30, 70 y.o.   MRN: [id number]  HPI  OV 12/31/2015  Chief Complaint  Patient presents with  . Follow-up    Pt states her breathing is unchanged since last OV. Pt states infrequent dry cough and wheezing. Pt denies CP/tightness, f/c/s.     OV 02/17/2015  Chief Complaint  Patient presents with  . Follow-up    Pt here after lung biospy. Pt c/o right lateral chest pain from the surgery, c/o chest congestion with non prod cough. Pt states her SOB is unchanged since last OV.    70 year old obese woman referred for pulm fibrosis. Hx from patient (poor hx) and chart and husband.  Office visit 11/22/2014: Dyspnea - insidious onset x several years. Progressive steadily x few years. Severe. Brought on with ltd exertion like ADLs. Relieved by rest. No associated cough or wheeze. ECHO 10/15/14 - gr1 diast dysfn and per husband recently started on lasix without improvement. Had CTA 11/9/176 - PE ruled out but per report CT shows  ILD.  Non specific scar changes on most recent CT and new RUL nodule in 2015 and 2016.   PFT 11/18/14 shows restriction with low DLCO - suggestive of ILD - FVC 70%, TLC 69%, DLCO 52% (BMI 29.67 kg/(m^2)).   Walking desat test 185 feet x 3 laps - stopped due to dyspnea prematurely at 2 laps. Also fatigue - did not desat at 2 laps.  02/17/15: Underwent right VATS, mini thoracotomy for open lung biopsy 01/24/2015 with Dr. Prescott Gum. Path with focal mild nonspecific chronic bronchiolitis and pleuritis with granulomatous features. Dyspnea improved since surgery but not back to baseline. Cough with chest congestion but unable to bring up. Pain improved from surgery. No fevers or chills. Occasional chronic chest pain. Some wheezing. She uses 2L O2 at night.   OV 05/20/2015  Chief Complaint  Patient presents with  . Follow-up    Pt states she feels her breathing has worsened since last OV. Pt c/o slight increase in SOB  with activity, mild dry cough and has occassional chest discomfort - this resolves with rest.     Follow-up dyspnea in the setting of restrictive PFTs and ILD pattern and CT chest. Surgical lung biopsy without specific ILD diagnosis  After last visit I referred her to pulmonary habitation which she says has helped her dyspnea. She did see [facility] Dr. Lauris Chroman in April 2017 and apparently has been reassured. I reviewed his notes and he did want to review her biopsy of the multidisciplinary thoracic ILD clinic. I do not have the conclusion of this but it appears from the patient and her husband who both are poor historians that they were reassured. They were even told that she might have some emphysema. In February 2017 she had admission for nonspecific weakness. At discharge she was given some albuterol which apparently helped. She is wanting to try the same  There are no other new issues.    OV 08/25/2015  Chief Complaint  Patient presents with  . Follow-up    Pt states that she does not feel she is improving. Pt has been exercising 3 days a week. Pt c/o an increase in SOB and wheeze with exertion. Pt denies cough/cp/tightness.     Follow-up dyspnea in the setting of restrictive PFTs and nocturnal desaturation with 2016 CT suggestive of ILD surgical lung biopsy without specific diagnosis.  She returns with her husband.  In April 2017 basal ILD clinic at [facility] and reassured. Since then she is attended pulmonary rehabilitation he did she tells me this does not help. She is currently working out a [employer] and this is not helping her. She still dyspneic with exertion relieved by rest. She did try Brio inhaler that I gave her and this did not help her either. There are no other new symptoms. Review of the chart shows she has chronic anemia hemoglobin 9 g percent at least since January 2017. A year ago she was a little bit higher than that. Most recently apparently  primary care physician checked her blood hemoglobin and it was low at 9 g percent. She does not know the cause. She never seen hematology. She was blood transfusions a few years ago. Let us high-resolution CT chest August 2017 read by thoracic radiologist suggested is no ILD anymore.  ONO  - positive for desats on RA - and started on 1 L [place] after this visit  OV 12/31/2015  Chief Complaint  Patient presents with  . Follow-up    Pt states her breathing is unchanged since last OV. Pt states infrequent dry cough and wheezing. Pt denies CP/tightness, f/c/s.      Follow-up dyspnea in the setting of restrictive PFTs and nocturnal desaturation with 2016 CT suggestive of ILD surgical lung biopsy - unclassifabile mil;d ILD without specific diagnosis Jan 2017 and fu CT clear august 2017  At last visit in August 2017 she still had dyspnea with the CT chest showed no interstitial lung disease. Overnight desaturation test then showed mild hypoxemia at night. We started on 1 L oxygen which she says seems to help but she is noncompliant with this. In addition I found her to be anemic with a hemoglobin less than 9 g percent in February 2017. She then followed up with a primary care physician and renal physician who has started on iron therapy. She tells me that August 2017 hemoglobin was 9 g percent. Since then after being on iron tablets hemoglobin check with her primary care physician a few weeks ago is greater than 10 g percent according to her history. I do not have those results with him. She does me despite improvement in hemoglobin her dyspnea persists. She is unable to take a shower without resting because of dyspnea. She is unable to climb a flight of stairs but she can change her clothes. Associated with the dyspnea is exertional wheeze. This no resting wheeze is no nocturnal awakening. This no edema this no chest tightness is no fever or chills. She continues to be obese and physically  deconditioned.  Exhaled nitric oxide done today for the first time 12/31/2015 -> 6 ppff and normal [noted in the past that inhaler therapy did not help her]    in past exercise therapy did not help her.    has a past medical history of Anginal pain ([place]); Anxiety; Arthritis; Carpal tunnel syndrome; Carpal tunnel syndrome, bilateral (07/31/2015); Cervical spondylosis without myelopathy; Chronic kidney disease; Chronic low back pain; Coronary artery disease (CARDIOLOGIST-  DR Irish Lack); Depression; GERD (gastroesophageal reflux disease); Gross hematuria; Heart murmur; History of esophageal dilatation; History of gout; History of hiatal hernia; History of kidney stones; History of non-ST elevation myocardial infarction (NSTEMI); History of supraventricular tachycardia; Hyperlipidemia; Hypertension; Hypothyroidism; Internal hemorrhoids; Irritable bowel syndrome; Partial seizure disorder (HCC) (NEUROLOGIST-- DR Jannifer Franklin); PONV (postoperative nausea and vomiting); Pulmonary fibrosis ([place]); S/P drug eluting coronary stent placement; S/P pericardial cyst excision; Seizures ([place]);  Shortness of breath dyspnea; Trigger finger, acquired (09/30/2015); and Vitamin D deficiency.   reports that she quit smoking about 48 years ago. Her smoking use included Cigarettes. She started smoking about 51 years ago. She has a 0.90 pack-year smoking history. She has never used smokeless tobacco.  Past Surgical History:  Procedure Laterality Date  . BIOPSY OF MEDIASTINAL MASS N/A 02/05/2013   Procedure: RESECTION OF MEDIASTINAL MASS;  Surgeon: Ivin Poot, MD;  Location: [place];  Service: Thoracic;  Laterality: N/A;  . CARDIAC CATHETERIZATION Right 01/24/2015   Procedure: right femoral ARTERIAL LINE INSERTION;  Surgeon: Ivin Poot, MD;  Location: [place];  Service: Thoracic;  Laterality: Right;  . CARDIOVASCULAR STRESS TEST  11-22-2013  dr Irish Lack   normal lexiscan study/  no ischemia/  normal LVF and wall motion/ ef 81%  .  COLONOSCOPY  last one 2014  . CORONARY ANGIOGRAM  03/10/2011   Procedure: CORONARY ANGIOGRAM;  Surgeon: Jettie Booze, MD;  Location: [facility] CATH LAB;  Service: Cardiovascular;;  . CORONARY ANGIOPLASTY WITH STENT PLACEMENT  11-22-2009  dr Irish Lack   Acute inferoposterior MI/  ef 60%,  PCI with DES x1 to dRCA,  25%  mLAD  . CORONARY ANGIOPLASTY WITH STENT PLACEMENT  06-26-2010  dr Daneen Schick   Cutting balloon angioplasty to dRCA with DES x1,  40% mLAD (non-obstructive CAD)  . CYSTOSCOPY WITH RETROGRADE PYELOGRAM, URETEROSCOPY AND STENT PLACEMENT Right 02/13/2014   Procedure: CYSTOSCOPY WITH RETROGRADE PYELOGRAM, URETEROSCOPY AND STENT PLACEMENT;  Surgeon: Bernestine Amass, MD;  Location: [facility];  Service: Urology;  Laterality: Right;  . ECTOPIC PREGNANCY SURGERY  YRS AGO   SALPINGECTOMY  . ESOPHAGOGASTRODUODENOSCOPY N/A 02/15/2014   Procedure: ESOPHAGOGASTRODUODENOSCOPY (EGD);  Surgeon: Jerene Bears, MD;  Location: [facility];  Service: Endoscopy;  Laterality: N/A;  . ESOPHAGOGASTRODUODENOSCOPY (EGD) WITH ESOPHAGEAL DILATION  05-20-2011  . HOLMIUM LASER APPLICATION Right [id number]   Procedure: HOLMIUM LASER APPLICATION;  Surgeon: Bernestine Amass, MD;  Location[facility];  Service: Urology;  Laterality: Right;  . LEFT HEART CATHETERIZATION WITH CORONARY ANGIOGRAM N/A 03/14/2012   Procedure: LEFT HEART CATHETERIZATION WITH CORONARY ANGIOGRAM;  Surgeon: Sueanne Margarita, MD;  Location: [place] CATH LAB;  Service: Cardiovascular;  Laterality: N/A;  Normal LM,  50% pLAD,  70% mLAD,  D2 50-70%, very tortuous LAD,  70-82% ostial LCFX,  50% in-stent restenosis of  dRCA and mRCA  stent and 50-70% ostial PDA,  normal LVSF, ef 60%  . LUNG BIOPSY Right 01/24/2015   Procedure: RIGHT LUNG BIOPSY;  Surgeon: Ivin Poot, MD;  Location: [place];  Service: Thoracic;  Laterality: Right;  . MEDIASTERNOTOMY N/A 02/05/2013   Procedure: MEDIAN STERNOTOMY;  Surgeon: Ivin Poot, MD;   Location: [place];  Service: Thoracic;  Laterality: N/A;  . NEEDLE GUIDED EXCISION BREAST CALCIFICATIONS Right 08-09-2008  . PERCUTANEOUS CORONARY STENT INTERVENTION (PCI-S) N/A 04/03/2012   Procedure: PERCUTANEOUS CORONARY STENT INTERVENTION (PCI-S);  Surgeon: Jettie Booze, MD;  Location: [facility] CATH LAB;  Service: Cardiovascular;  Laterality: N/A;   Successful PCI  mLAD with 2.75x12 Promus stent, postdilated to >0.48mm  . THORACOTOMY Right 01/24/2015   Procedure: RIGHT MINI/LIMITED THORACOTOMY;  Surgeon: Ivin Poot, MD;  Location: [place];  Service: Thoracic;  Laterality: Right;  . TRANSTHORACIC ECHOCARDIOGRAM  11-17-2011   grade I diastolic dysfunction/  ef 55-60%  . VIDEO BRONCHOSCOPY N/A 01/24/2015   Procedure: RIGHT VIDEO BRONCHOSCOPY;  Surgeon: Ivin Poot, MD;  Location: [facility] OR;  Service: Thoracic;  Laterality: N/A;    Allergies  Allergen Reactions  . Tape Rash    ? Clear tape    Immunization History  Administered Date(s) Administered  . Influenza, High Dose Seasonal PF 10/12/2015  . Influenza,inj,Quad PF,36+ Mos 09/23/2014  . Influenza-Unspecified 10/11/2012  . Pneumococcal-Unspecified 11/15/2009  . Zoster 02/07/2012    Family History  Problem Relation Age of Onset  . Breast cancer Sister   . Cancer Sister     breast  . Heart disease Father   . Hypertension Father   . Emphysema Father   . Coronary artery disease Father   . Diabetes Mellitus I Father   . Heart attack Mother   . CVA Mother   . Breast cancer      niece  . Colon cancer Neg Hx      Current Outpatient Prescriptions:  .  ALPRAZolam (XANAX) 0.25 MG tablet, Take 0.25 mg by mouth at bedtime., Disp: , Rfl:  .  aspirin EC 81 MG tablet, Take 81 mg by mouth daily., Disp: , Rfl:  .  calcitRIOL (ROCALTROL) 0.5 MCG capsule, Take 0.5 mcg by mouth daily., Disp: , Rfl:  .  clopidogrel (PLAVIX) 75 MG tablet, TAKE 1 TABLET (75 MG TOTAL) BY MOUTH DAILY WITH BREAKFAST., Disp: 30 tablet, Rfl: 10 .  diltiazem  (MATZIM LA) 240 MG 24 hr tablet, Take 240 mg by mouth every morning. , Disp: , Rfl:  .  doxazosin (CARDURA) 2 MG tablet, TAKE 1 TABLET (2 MG TOTAL) BY MOUTH DAILY., Disp: 30 tablet, Rfl: 8 .  ferrous sulfate 325 (65 FE) MG tablet, Take 325 mg by mouth., Disp: , Rfl:  .  furosemide (LASIX) 20 MG tablet, TAKE 1 TABLET (20 MG TOTAL) BY MOUTH EVERY OTHER DAY., Disp: 15 tablet, Rfl: 10 .  HYDROcodone-acetaminophen (NORCO) 10-325 MG tablet, Take 1 tablet by mouth every 6 (six) hours as needed., Disp: , Rfl:  .  isosorbide mononitrate (IMDUR) 60 MG 24 hr tablet, TAKE 1 AND 1/2 TABLETS BY MOUTH EVERY DAY, Disp: 45 tablet, Rfl: 5 .  levETIRAcetam (KEPPRA) 500 MG tablet, Take 1 tablet (500 mg total) by mouth 2 (two) times daily., Disp: 60 tablet, Rfl: 11 .  levothyroxine (SYNTHROID, LEVOTHROID) 112 MCG tablet, Take 112 mcg by mouth daily., Disp: , Rfl: 5 .  losartan (COZAAR) 100 MG tablet, Take 100 mg by mouth every morning. , Disp: , Rfl:  .  metoprolol succinate (TOPROL-XL) 50 MG 24 hr tablet, Take 50 mg by mouth daily. Take with or immediately following a meal., Disp: , Rfl:  .  NITROSTAT 0.4 MG SL tablet, PLACE 1 TABLET (0.4 MG TOTAL) UNDER THE TONGUE EVERY 5 (FIVE) MINUTES AS NEEDED FOR CHEST PAIN., Disp: 25 tablet, Rfl: 3 .  pantoprazole (PROTONIX) 40 MG tablet, TAKE 1 TABLET BY MOUTH TWICE A DAY, Disp: 60 tablet, Rfl: 2 .  PARoxetine (PAXIL) 30 MG tablet, Take 30 mg by mouth daily., Disp: , Rfl:  .  potassium chloride SA (K-DUR,KLOR-CON) 20 MEQ tablet, Take 1 tablet (20 mEq total) by mouth every other day. Take with Lasix., Disp: 30 tablet, Rfl: 6 .  rosuvastatin (CRESTOR) 20 MG tablet, Take 1 tablet (20 mg total) by mouth every morning., Disp: 90 tablet, Rfl: 3    Review of Systems     Objective:   Physical Exam  Constitutional: She is oriented to person, place, and time. She appears well-developed and well-nourished. No distress.  Body mass index  is 32.81 kg/m.   HENT:  Head: Normocephalic  and atraumatic.  Right Ear: External ear normal.  Left Ear: External ear normal.  Mouth/Throat: Oropharynx is clear and moist. No oropharyngeal exudate.  mallampatti class 3  Eyes: Conjunctivae and EOM are normal. Pupils are equal, round, and reactive to light. Right eye exhibits no discharge. Left eye exhibits no discharge. No scleral icterus.  Neck: Normal range of motion. Neck supple. No JVD present. No tracheal deviation present. No thyromegaly present.  Cardiovascular: Normal rate, regular rhythm, normal heart sounds and intact distal pulses.  Exam reveals no gallop and no friction rub.   No murmur heard. Pulmonary/Chest: Effort normal and breath sounds normal. No respiratory distress. She has no wheezes. She has no rales. She exhibits no tenderness.  Abdominal: Soft. Bowel sounds are normal. She exhibits no distension and no mass. There is no tenderness. There is no rebound and no guarding.  Visceral obesity +  Musculoskeletal: Normal range of motion. She exhibits no edema or tenderness.  OA in finger joints  Lymphadenopathy:    She has no cervical adenopathy.  Neurological: She is alert and oriented to person, place, and time. She has normal reflexes. No cranial nerve deficit. She exhibits normal muscle tone. Coordination normal.  Skin: Skin is warm and dry. No rash noted. She is not diaphoretic. No erythema. No pallor.  Mild pallor  Psychiatric: She has a normal mood and affect. Her behavior is normal. Judgment and thought content normal.  Vitals reviewed.    Vitals:   12/31/15 0909  BP: 112/76  Pulse: 66  SpO2: 93%  Weight: 157 lb (71.2 kg)  Height: 4\' 10"  (1.473 m)   Estimated body mass index is 32.81 kg/m as calculated from the following:   Height as of this encounter: 4\' 10"  (1.473 m).   Weight as of this encounter: 157 lb (71.2 kg).      Assessment:       ICD-9-CM ICD-10-CM   1. Dyspnea and respiratory abnormality 786.09 R06.00 Nitric oxide    R06.89         Plan:      Glad anemia is better but you are not feeling better  No evidence of asthma on Nitric Oxide test  Unclear why short of breath and why wheezing: but typpically weight, physical deconditioning and stiff heart muscle (called diastolic dysfunction) are playing a role   PLAN - nurse to call PCP Reginia Naas, MD and get latest hgb result - pulmonary stress test on bike with EIB challenge - in 2018  - ROV with me after that   Dr. Brand Males, M.D., [facility].C.P Pulmonary and Critical Care Medicine Staff Physician [place] [facility] Pager: [phone], If no answer or between  15:00h - 7:00h: call [phone]  12/31/2015 9:31 AM

## 2016-01-02 DIAGNOSIS — J8410 Pulmonary fibrosis, unspecified: Secondary | ICD-10-CM | POA: Diagnosis not present

## 2016-01-02 DIAGNOSIS — R531 Weakness: Secondary | ICD-10-CM | POA: Diagnosis not present

## 2016-01-02 DIAGNOSIS — R0602 Shortness of breath: Secondary | ICD-10-CM | POA: Diagnosis not present

## 2016-01-02 DIAGNOSIS — J9611 Chronic respiratory failure with hypoxia: Secondary | ICD-10-CM | POA: Diagnosis not present

## 2016-01-22 ENCOUNTER — Encounter: Payer: Self-pay | Admitting: Podiatry

## 2016-01-22 ENCOUNTER — Ambulatory Visit (INDEPENDENT_AMBULATORY_CARE_PROVIDER_SITE_OTHER): Payer: PPO | Admitting: Podiatry

## 2016-01-22 ENCOUNTER — Ambulatory Visit (INDEPENDENT_AMBULATORY_CARE_PROVIDER_SITE_OTHER): Payer: PPO

## 2016-01-22 VITALS — BP 134/71 | HR 75 | Resp 16 | Ht 59.0 in | Wt 155.0 lb

## 2016-01-22 DIAGNOSIS — M722 Plantar fascial fibromatosis: Secondary | ICD-10-CM | POA: Diagnosis not present

## 2016-01-22 DIAGNOSIS — M79671 Pain in right foot: Secondary | ICD-10-CM

## 2016-01-22 DIAGNOSIS — L600 Ingrowing nail: Secondary | ICD-10-CM | POA: Diagnosis not present

## 2016-01-22 MED ORDER — TRIAMCINOLONE ACETONIDE 10 MG/ML IJ SUSP
10.0000 mg | Freq: Once | INTRAMUSCULAR | Status: AC
  Administered 2016-01-22: 10 mg

## 2016-01-22 NOTE — Progress Notes (Signed)
   Subjective:    Patient ID: [name], female    DOB: Apr 07, 1945, 71 y.o.   MRN: [id number]  HPI Chief Complaint  Patient presents with  . Foot Pain    Right foot; heel; pt stated, "Has  had on and off pain for the past year"  . Nail Problem    Right foot; 2nd, 4th & 5th toes; nail discoloration and thickened nails; pt stated,"Wants to discuss removing nails"      Review of Systems  Constitutional: Positive for appetite change, fatigue and unexpected weight change.  HENT: Positive for hearing loss and trouble swallowing.   Respiratory: Positive for shortness of breath.   Musculoskeletal: Positive for back pain.  All other systems reviewed and are negative.      Objective:   Physical Exam        Assessment & Plan:

## 2016-01-22 NOTE — Patient Instructions (Signed)

## 2016-01-23 ENCOUNTER — Ambulatory Visit (INDEPENDENT_AMBULATORY_CARE_PROVIDER_SITE_OTHER): Payer: PPO | Admitting: Internal Medicine

## 2016-01-23 ENCOUNTER — Encounter: Payer: Self-pay | Admitting: Internal Medicine

## 2016-01-23 VITALS — BP 124/72 | HR 74 | Ht 59.0 in | Wt 158.0 lb

## 2016-01-23 DIAGNOSIS — R1310 Dysphagia, unspecified: Secondary | ICD-10-CM

## 2016-01-23 DIAGNOSIS — K219 Gastro-esophageal reflux disease without esophagitis: Secondary | ICD-10-CM

## 2016-01-23 DIAGNOSIS — R1319 Other dysphagia: Secondary | ICD-10-CM

## 2016-01-23 MED ORDER — PANTOPRAZOLE SODIUM 40 MG PO TBEC: 40.0000 mg | DELAYED_RELEASE_TABLET | Freq: Two times a day (BID) | ORAL | 11 refills | Status: DC

## 2016-01-23 NOTE — Progress Notes (Signed)
HISTORY OF PRESENT ILLNESS:  [name] is a 71 y.o. female  with MULTIPLE SIGNIFICANT medical problems as listed below. She has been followed in this office for GERD, chronic dysphagia, and colon cancer screening. She was last evaluated in the office 10/30/2014 with chest pain felt secondary to sternotomy, GERD requiring twice daily PPI, chronic stable dysphagia. See that dictation. She presents today for routine follow-up. She continues on twice daily pantoprazole for control of GERD symptoms. Occasional problems with liquid and solid dysphagia without change. Tolerable. She does have significant regurgitation. Not following reflux precautions necessarily. Was advised by one of her other providers that she may want to consider an alternative PPI because she has some level of renal impairment. She wishes to discuss.  REVIEW OF SYSTEMS:  All non-GI ROS negative except for back pain, depression, hearing problems, muscle cramps, insomnia, increased thirst, foot pain, shortness of breath  Past Medical History:  Diagnosis Date  . Anginal pain ([place])    over yr last time  . Anxiety   . Arthritis   . Carpal tunnel syndrome   . Carpal tunnel syndrome, bilateral 07/31/2015  . Cervical spondylosis without myelopathy   . Chronic kidney disease   . Chronic low back pain   . Coronary artery disease CARDIOLOGIST-  DR Irish Lack   hx NSTEMI inferoposterior 2011 s/p PCI  total x3 DES's  . Depression   . GERD (gastroesophageal reflux disease)   . Gross hematuria   . Heart murmur   . History of esophageal dilatation    FOR STRIUCTURE  . History of gout   . History of hiatal hernia   . History of kidney stones   . History of non-ST elevation myocardial infarction (NSTEMI)    2011--  S/P PCI WITH SENTING  . History of supraventricular tachycardia    2011-- resolved  . Hyperlipidemia   . Hypertension   . Hypothyroidism   . Internal hemorrhoids   . Irritable bowel syndrome   . Partial seizure  disorder (HCC) NEUROLOGIST-- DR WILLIS   NOCTURNAL  . PONV (postoperative nausea and vomiting)    hard time getting iv site-had to do neck stick 2 yrs ago  . Pulmonary fibrosis ([place])   . S/P drug eluting coronary stent placement    X3  in 2011, 2012, 2014  . S/P pericardial cyst excision    02-05-2013  benign  . Seizures ([place])    none in yrs  . Shortness of breath dyspnea   . Trigger finger, acquired 09/30/2015   Right middle finger  . Vitamin D deficiency     Past Surgical History:  Procedure Laterality Date  . BIOPSY OF MEDIASTINAL MASS N/A 02/05/2013   Procedure: RESECTION OF MEDIASTINAL MASS;  Surgeon: Ivin Poot, MD;  Location: [place];  Service: Thoracic;  Laterality: N/A;  . CARDIAC CATHETERIZATION Right 01/24/2015   Procedure: right femoral ARTERIAL LINE INSERTION;  Surgeon: Ivin Poot, MD;  Location: [place];  Service: Thoracic;  Laterality: Right;  . CARDIOVASCULAR STRESS TEST  11-22-2013  dr Irish Lack   normal lexiscan study/  no ischemia/  normal LVF and wall motion/ ef 81%  . COLONOSCOPY  last one 2014  . CORONARY ANGIOGRAM  03/10/2011   Procedure: CORONARY ANGIOGRAM;  Surgeon: Jettie Booze, MD;  Location[facility] C[facility] LAB;  Service: Cardiovascular;;  . CORONARY ANGIOPLASTY WITH STENT PLACEMENT  11-22-2009  dr Irish Lack   Acute inferoposterior MI/  ef 60%,  PCI with DES x1 to dRCA,  25%  mLAD  . CORONARY ANGIOPLASTY WITH STENT PLACEMENT  06-26-2010  dr Daneen Schick   Cutting balloon angioplasty to dRCA with DES x1,  40% mLAD (non-obstructive CAD)  . CYSTOSCOPY WITH RETROGRADE PYELOGRAM, URETEROSCOPY AND STENT PLACEMENT Right 02/13/2014   Procedure: CYSTOSCOPY WITH RETROGRADE PYELOGRAM, URETEROSCOPY AND STENT PLACEMENT;  Surgeon: Bernestine Amass, MD;  [facility];  Service: Urology;  Laterality: Right;  . ECTOPIC PREGNANCY SURGERY  YRS AGO   SALPINGECTOMY  . ESOPHAGOGASTRODUODENOSCOPY N/A 02/15/2014   Procedure: ESOPHAGOGASTRODUODENOSCOPY (EGD);   Surgeon: Jerene Bears, MD;  Location: [facility];  Service: Endoscopy;  Laterality: N/A;  . ESOPHAGOGASTRODUODENOSCOPY (EGD) WITH ESOPHAGEAL DILATION  05-20-2011  . HOLMIUM LASER APPLICATION Right [id number]   Procedure: HOLMIUM LASER APPLICATION;  Surgeon: Bernestine Amass, MD;  Location: [facility];  Service: Urology;  Laterality: Right;  . LEFT HEART CATHETERIZATION WITH CORONARY ANGIOGRAM N/A 03/14/2012   Procedure: LEFT HEART CATHETERIZATION WITH CORONARY ANGIOGRAM;  Surgeon: Sueanne Margarita, MD;  Location: [place] CATH LAB;  Service: Cardiovascular;  Laterality: N/A;  Normal LM,  50% pLAD,  70% mLAD,  D2 50-70%, very tortuous LAD,  70-82% ostial LCFX,  50% in-stent restenosis of  dRCA and mRCA  stent and 50-70% ostial PDA,  normal LVSF, ef 60%  . LUNG BIOPSY Right 01/24/2015   Procedure: RIGHT LUNG BIOPSY;  Surgeon: Ivin Poot, MD;  Location: [place];  Service: Thoracic;  Laterality: Right;  . MEDIASTERNOTOMY N/A 02/05/2013   Procedure: MEDIAN STERNOTOMY;  Surgeon: Ivin Poot, MD;  Location: [place];  Service: Thoracic;  Laterality: N/A;  . NEEDLE GUIDED EXCISION BREAST CALCIFICATIONS Right 08-09-2008  . PERCUTANEOUS CORONARY STENT INTERVENTION (PCI-S) N/A 04/03/2012   Procedure: PERCUTANEOUS CORONARY STENT INTERVENTION (PCI-S);  Surgeon: Jettie Booze, MD;  Location: [facility] CATH LAB;  Service: Cardiovascular;  Laterality: N/A;   Successful PCI  mLAD with 2.75x12 Promus stent, postdilated to >0.1mm  . THORACOTOMY Right 01/24/2015   Procedure: RIGHT MINI/LIMITED THORACOTOMY;  Surgeon: Ivin Poot, MD;  Location: [place];  Service: Thoracic;  Laterality: Right;  . TRANSTHORACIC ECHOCARDIOGRAM  11-17-2011   grade I diastolic dysfunction/  ef 55-60%  . VIDEO BRONCHOSCOPY N/A 01/24/2015   Procedure: RIGHT VIDEO BRONCHOSCOPY;  Surgeon: Ivin Poot, MD;  Location: [facility] OR;  Service: Thoracic;  Laterality: N/A;    Social History [name]  reports that she quit smoking about 48  years ago. Her smoking use included Cigarettes. She started smoking about 51 years ago. She has a 0.90 pack-year smoking history. She has never used smokeless tobacco. She reports that she does not drink alcohol or use drugs.  family history includes Breast cancer in her sister; CVA in her mother; Cancer in her sister; Coronary artery disease in her father; Diabetes Mellitus I in her father; Emphysema in her father; Heart attack in her mother; Heart disease in her father; Hypertension in her father.  Allergies  Allergen Reactions  . Tape Rash    ? Clear tape       PHYSICAL EXAMINATION: Vital signs: BP 124/72   Pulse 74   Ht 4\' 11"  (1.499 m)   Wt 158 lb (71.7 kg)   BMI 31.91 kg/m   Constitutional: Obese, generally well-appearing, no acute distress Psychiatric: alert and oriented x3, cooperative Eyes: extraocular movements intact, anicteric, conjunctiva pink Mouth: oral pharynx moist, no lesions Neck: supple no lymphadenopathy Cardiovascular: heart regular rate and rhythm, no murmur Lungs: clear to  auscultation bilaterally Abdomen: soft, obese, nontender, nondistended, no obvious ascites, no peritoneal signs, normal bowel sounds, no organomegaly Rectal: Omitted Extremities: no clubbing cyanosis or lower extremity edema bilaterally. Brace on right ankle Skin: no lesions on visible extremities Neuro: No focal deficits. Cranial nerves intact  ASSESSMENT:  #1. Chronic GERD. She does require twice a day PPI for symptom control. She is having significant regurgitation which I think may respond to more aggressive reflux precautions #2. Chronic dysphagia. Combination of dysmotility and large caliber ring previously dilated. Stable #3. Colon cancer screening/surveillance. Up-to-date   PLAN:  #1. Reflux precautions. We discussed elevation of head of bed, smaller meals, and avoiding meals within 4 hours of bedtime #2. Refill PPI per her request. We did discuss the available knowledge  regarding chronic PPI use and the potential negative effects on the kidneys. She was offered the option to switch to H2 receptor antagonist therapy. However, she feels that she would be miserable with symptoms based on previous experience. Either way, she is well informed #3. Routine office follow-up one year  15 minutes spent face-to-face with the patient. Greater than 50% a time use for counseling regarding chronic GERD, dysphagia, reflux precautions, and PPI issues

## 2016-01-23 NOTE — Patient Instructions (Signed)
We have sent the following medications to your pharmacy for you to pick up at your convenience:  Pantoprazole  Please follow up in one year  

## 2016-01-25 NOTE — Progress Notes (Signed)
Subjective:     Patient ID: [name], female   DOB: 15-Jul-1945, 71 y.o.   MRN: [id number]  HPI patient presents stating that she's been having a lot of pain in the bottom of her right heel and that also she has several nails on her right foot that are becoming increasingly thick and she's having trouble cutting and may need to have removed. States the heel pains been present for several months   Review of Systems  All other systems reviewed and are negative.      Objective:   Physical Exam  Constitutional: She is oriented to person, place, and time.  Cardiovascular: Intact distal pulses.   Musculoskeletal: Normal range of motion.  Neurological: She is oriented to person, place, and time.  Skin: Skin is warm.  Nursing note and vitals reviewed.  neurovascular status intact muscle strength adequate range of motion within normal limits with patient noted to have exquisite discomfort plantar aspect right heel at the insertional point of the tendon into the calcaneus with inflammation and fluid around the medial band. Patient is noted to have moderate depression of the arch and has thickened nails hallux second and fifth that upon dorsal pressure painful especially second nail. Patient's found have good digital perfusion and is well oriented 3     Assessment:     Acute plantar fasciitis right with nail disease right which is painful when pressed    Plan:     H&P x-rays reviewed conditions discussed. At this point were to focus on the heel and I did inject the plantar fascia 3 mg Kenalog 5 mill grams Xylocaine and applied fascial brace instructed on physical therapy and reappoint in the next several weeks. May need to work on the nails which will be decided in future we will also continue to monitor circulatory status  \X-ray report indicates that there is spur formation with no indications of stress fracture arthritis

## 2016-01-26 ENCOUNTER — Other Ambulatory Visit (HOSPITAL_COMMUNITY): Payer: Self-pay | Admitting: *Deleted

## 2016-01-26 ENCOUNTER — Encounter (HOSPITAL_COMMUNITY): Payer: Self-pay | Admitting: *Deleted

## 2016-01-26 ENCOUNTER — Ambulatory Visit (HOSPITAL_COMMUNITY): Payer: PPO | Attending: Internal Medicine

## 2016-01-26 DIAGNOSIS — R0689 Other abnormalities of breathing: Principal | ICD-10-CM

## 2016-01-26 DIAGNOSIS — R0600 Dyspnea, unspecified: Secondary | ICD-10-CM | POA: Insufficient documentation

## 2016-01-27 DIAGNOSIS — R0600 Dyspnea, unspecified: Secondary | ICD-10-CM | POA: Diagnosis not present

## 2016-01-30 NOTE — Progress Notes (Signed)
lmtcb for pt.  

## 2016-01-31 ENCOUNTER — Encounter: Payer: Self-pay | Admitting: Cardiology

## 2016-01-31 ENCOUNTER — Other Ambulatory Visit: Payer: Self-pay | Admitting: Cardiology

## 2016-01-31 NOTE — Progress Notes (Signed)
Cardiology Office Note    Date:  02/02/2016   ID:  [name], DOB Jul 03, 1945, MRN [id number]  PCP:  Reginia Naas, MD  Cardiologist:  Fransico Him, MD   Chief Complaint  Patient presents with  . Coronary Artery Disease  . Hypertension  . Hyperlipidemia    History of Present Illness:  [name] is a 71 y.o. female with a history of CAD/HTN, pericardial cyst s/p resection and dyslipidemia who presents today for followup.  She has a history of chronic CP with esophageal strictures and has had esophageal dilatation.She underwent VATS procedure via mini thoracotomy syndrome with open lung bx and was diagnosed with interstitial lung disease.  She has chronic CP that has been felt to be noncardiac in the past and has started to reoccur.  It is nonexertional and is midsternal with no radiation. She describes it as a knife and her left arm goes numb.  She was diagnosed with Carpal tunnel    She has had some problems with sharp chest pain since November.  She has chronic DOE and thinks she is worse and has gained some weight.  She has not been able to exercise due to plantar fasciitis.  She recently underwent a cardiopulmonary stress test by Pulmonary to evaluate her SOB and it showed deconditioning and poor HR response to exercise.   She denies any LE edema, palpitations or syncope. She has had some dizziness due to orthostatic hypotension but that has improved and only occurs if she gets out of breath and hyperventilates.      Past Medical History:  Diagnosis Date  . Anxiety   . Arthritis   . Carpal tunnel syndrome, bilateral 07/31/2015  . Cervical spondylosis without myelopathy   . Chronic kidney disease   . Chronic low back pain   . Coronary artery disease CARDIOLOGIST-  DR Irish Lack   hx NSTEMI inferoposterior 2011 s/p PCI  total x3 DES's  . Depression   . GERD (gastroesophageal reflux disease)   . Gross hematuria   . Heart murmur   . History of esophageal dilatation     FOR STRIUCTURE  . History of gout   . History of hiatal hernia   . History of kidney stones   . History of supraventricular tachycardia    2011-- resolved  . Hyperlipidemia   . Hypertension   . Hypothyroidism   . Internal hemorrhoids   . Irritable bowel syndrome   . Partial seizure disorder (HCC) NEUROLOGIST-- DR WILLIS   NOCTURNAL  . PONV (postoperative nausea and vomiting)    hard time getting iv site-had to do neck stick 2 yrs ago  . Pulmonary fibrosis ([place])   . S/P pericardial cyst excision    02-05-2013  benign  . Seizures ([place])    none in yrs  . Shortness of breath dyspnea   . Trigger finger, acquired 09/30/2015   Right middle finger  . Vitamin D deficiency     Past Surgical History:  Procedure Laterality Date  . BIOPSY OF MEDIASTINAL MASS N/A 02/05/2013   Procedure: RESECTION OF MEDIASTINAL MASS;  Surgeon: Ivin Poot, MD;  Location: [place];  Service: Thoracic;  Laterality: N/A;  . CARDIAC CATHETERIZATION Right 01/24/2015   Procedure: right femoral ARTERIAL LINE INSERTION;  Surgeon: Ivin Poot, MD;  Location: [place];  Service: Thoracic;  Laterality: Right;  . CARDIOVASCULAR STRESS TEST  11-22-2013  dr Irish Lack   normal lexiscan study/  no ischemia/  normal LVF and wall  motion/ ef 81%  . COLONOSCOPY  last one 2014  . CORONARY ANGIOGRAM  03/10/2011   Procedure: CORONARY ANGIOGRAM;  Surgeon: Jettie Booze, MD;  Location: [facility] CATH LAB;  Service: Cardiovascular;;  . CORONARY ANGIOPLASTY WITH STENT PLACEMENT  11-22-2009  dr Irish Lack   Acute inferoposterior MI/  ef 60%,  PCI with DES x1 to dRCA,  25%  mLAD  . CORONARY ANGIOPLASTY WITH STENT PLACEMENT  06-26-2010  dr Daneen Schick   Cutting balloon angioplasty to dRCA with DES x1,  40% mLAD (non-obstructive CAD)  . CYSTOSCOPY WITH RETROGRADE PYELOGRAM, URETEROSCOPY AND STENT PLACEMENT Right 02/13/2014   Procedure: CYSTOSCOPY WITH RETROGRADE PYELOGRAM, URETEROSCOPY AND STENT PLACEMENT;  Surgeon: Bernestine Amass, MD;   Location: [facility];  Service: Urology;  Laterality: Right;  . ECTOPIC PREGNANCY SURGERY  YRS AGO   SALPINGECTOMY  . ESOPHAGOGASTRODUODENOSCOPY N/A 02/15/2014   Procedure: ESOPHAGOGASTRODUODENOSCOPY (EGD);  Surgeon: Jerene Bears, MD;  Location: [facility];  Service: Endoscopy;  Laterality: N/A;  . ESOPHAGOGASTRODUODENOSCOPY (EGD) WITH ESOPHAGEAL DILATION  05-20-2011  . HOLMIUM LASER APPLICATION Right [id number]   Procedure: HOLMIUM LASER APPLICATION;  Surgeon: Bernestine Amass, MD;  Location: [facility];  Service: Urology;  Laterality: Right;  . LEFT HEART CATHETERIZATION WITH CORONARY ANGIOGRAM N/A 03/14/2012   Procedure: LEFT HEART CATHETERIZATION WITH CORONARY ANGIOGRAM;  Surgeon: Sueanne Margarita, MD;  Location: [place] CATH LAB;  Service: Cardiovascular;  Laterality: N/A;  Normal LM,  50% pLAD,  70% mLAD,  D2 50-70%, very tortuous LAD,  70-82% ostial LCFX,  50% in-stent restenosis of  dRCA and mRCA  stent and 50-70% ostial PDA,  normal LVSF, ef 60%  . LUNG BIOPSY Right 01/24/2015   Procedure: RIGHT LUNG BIOPSY;  Surgeon: Ivin Poot, MD;  Location: [place];  Service: Thoracic;  Laterality: Right;  . MEDIASTERNOTOMY N/A 02/05/2013   Procedure: MEDIAN STERNOTOMY;  Surgeon: Ivin Poot, MD;  Location: [place];  Service: Thoracic;  Laterality: N/A;  . NEEDLE GUIDED EXCISION BREAST CALCIFICATIONS Right 08-09-2008  . PERCUTANEOUS CORONARY STENT INTERVENTION (PCI-S) N/A 04/03/2012   Procedure: PERCUTANEOUS CORONARY STENT INTERVENTION (PCI-S);  Surgeon: Jettie Booze, MD;  Location: [facility] CATH LAB;  Service: Cardiovascular;  Laterality: N/A;   Successful PCI  mLAD with 2.75x12 Promus stent, postdilated to >0.80mm  . THORACOTOMY Right 01/24/2015   Procedure: RIGHT MINI/LIMITED THORACOTOMY;  Surgeon: Ivin Poot, MD;  Location: [place];  Service: Thoracic;  Laterality: Right;  . TRANSTHORACIC ECHOCARDIOGRAM  11-17-2011   grade I diastolic dysfunction/  ef 55-60%  . VIDEO  BRONCHOSCOPY N/A 01/24/2015   Procedure: RIGHT VIDEO BRONCHOSCOPY;  Surgeon: Ivin Poot, MD;  Location: [facility] OR;  Service: Thoracic;  Laterality: N/A;    Current Medications: Outpatient Medications Prior to Visit  Medication Sig Dispense Refill  . ALPRAZolam (XANAX) 0.25 MG tablet Take 0.25 mg by mouth at bedtime.    Marland Kitchen aspirin EC 81 MG tablet Take 81 mg by mouth daily.    . calcitRIOL (ROCALTROL) 0.5 MCG capsule Take 0.5 mcg by mouth daily.    . clopidogrel (PLAVIX) 75 MG tablet TAKE 1 TABLET (75 MG TOTAL) BY MOUTH DAILY WITH BREAKFAST. 30 tablet 10  . diltiazem (MATZIM LA) 240 MG 24 hr tablet Take 240 mg by mouth every morning.     Marland Kitchen doxazosin (CARDURA) 2 MG tablet TAKE 1 TABLET (2 MG TOTAL) BY MOUTH DAILY. 30 tablet 8  . ferrous sulfate 325 (65 FE) MG tablet Take  325 mg by mouth.    . furosemide (LASIX) 20 MG tablet TAKE 1 TABLET (20 MG TOTAL) BY MOUTH EVERY OTHER DAY. 15 tablet 10  . HYDROcodone-acetaminophen (NORCO) 10-325 MG tablet Take 1 tablet by mouth every 6 (six) hours as needed.    . isosorbide mononitrate (IMDUR) 60 MG 24 hr tablet TAKE 1 AND 1/2 TABLETS BY MOUTH EVERY DAY 45 tablet 5  . levETIRAcetam (KEPPRA) 500 MG tablet Take 1 tablet (500 mg total) by mouth 2 (two) times daily. 60 tablet 11  . levothyroxine (SYNTHROID, LEVOTHROID) 112 MCG tablet Take 112 mcg by mouth daily.  5  . losartan (COZAAR) 100 MG tablet Take 100 mg by mouth every morning.     . metoprolol succinate (TOPROL-XL) 50 MG 24 hr tablet Take 50 mg by mouth daily. Take with or immediately following a meal.    . NITROSTAT 0.4 MG SL tablet PLACE 1 TABLET (0.4 MG TOTAL) UNDER THE TONGUE EVERY 5 (FIVE) MINUTES AS NEEDED FOR CHEST PAIN. 25 tablet 3  . pantoprazole (PROTONIX) 40 MG tablet Take 1 tablet (40 mg total) by mouth 2 (two) times daily. 60 tablet 11  . PARoxetine (PAXIL) 30 MG tablet Take 30 mg by mouth daily.    . potassium chloride SA (K-DUR,KLOR-CON) 20 MEQ tablet Take 1 tablet (20 mEq total) by mouth  every other day. Take with Lasix. 30 tablet 6  . rosuvastatin (CRESTOR) 20 MG tablet Take 1 tablet (20 mg total) by mouth every morning. 90 tablet 3   No facility-administered medications prior to visit.      Allergies:   Tape   Social History   Social History  . Marital status: Married    Spouse name: Fritz Pickerel  . Number of children: 0  . Years of education: hs   Occupational History  . Retired Retired   Social History Main Topics  . Smoking status: Former Smoker    Packs/day: 0.30    Years: 3.00    Types: Cigarettes    Start date: 11/08/1964    Quit date: 11/09/1967  . Smokeless tobacco: Never Used  . Alcohol use No  . Drug use: No  . Sexual activity: Not Asked   Other Topics Concern  . None   Social History Narrative   Lives at home, married   Left-handed   Daily caffeine use: coffee.     Family History:  The patient's family history includes Breast cancer in her sister; CVA in her mother; Cancer in her sister; Coronary artery disease in her father; Diabetes Mellitus I in her father; Emphysema in her father; Heart attack in her mother; Heart disease in her father; Hypertension in her father.   ROS:   Please see the history of present illness.    ROS All other systems reviewed and are negative.  No flowsheet data found.     PHYSICAL EXAM:   VS:  BP 122/68   Pulse 76   Ht 4\' 11"  (1.499 m)   Wt 156 lb 12.8 oz (71.1 kg)   SpO2 98%   BMI 31.67 kg/m    GEN: Well nourished, well developed, in no acute distress  HEENT: normal  Neck: no JVD, carotid bruits, or masses Cardiac: RRR; no murmurs, rubs, or gallops,no edema.  Intact distal pulses bilaterally.  Respiratory:  clear to auscultation bilaterally, normal work of breathing GI: soft, nontender, nondistended, + BS MS: no deformity or atrophy  Skin: warm and dry, no rash Neuro:  Alert and Oriented  x 3, Strength and sensation are intact Psych: euthymic mood, full affect  Wt Readings from Last 3 Encounters:    02/02/16 156 lb 12.8 oz (71.1 kg)  01/23/16 158 lb (71.7 kg)  01/22/16 155 lb (70.3 kg)      Studies/Labs Reviewed:   EKG:  EKG is ordered today.  The ekg ordered today demonstrates   Recent Labs: 03/01/2015: B Natriuretic Peptide 128.8 03/03/2015: Hemoglobin 8.7; Magnesium 1.7; Platelets 317 06/06/2015: ALT 6; BUN 14; Creat 1.12; Potassium 3.3; Sodium 142   Lipid Panel    Component Value Date/Time   CHOL 147 06/06/2015 0928   TRIG 55 06/06/2015 0928   HDL 82 06/06/2015 0928   CHOLHDL 1.8 06/06/2015 0928   VLDL 11 06/06/2015 0928   LDLCALC 54 06/06/2015 0928    Additional studies/ records that were reviewed today include:  none    ASSESSMENT:    1. Atherosclerosis of native coronary artery of native heart without angina pectoris   2. Essential hypertension   3. Paroxysmal SVT (supraventricular tachycardia) ([place])   4. Pericardial cyst   5. Pure hypercholesterolemia   6. Chest pain, unspecified type      PLAN:  In order of problems listed above:  1. ASCAD s/p remote NSTEMI 2011 inferoposterior s/p stenting x 3 to.  She will continue on ASA/Plavix/long acting nitrate/BB and statin. She is going to have surgery on her hand in the near future and is ok to hold her plavix and ASA for the procedure. 2. HTN - BP controlled on current meds. Continue BB/ARB.  Check BMET. Due to chronotropic incompetence  on stress test I am going to change her Cardizem to Amlodipine 5mg  daily.  I will have her check her BP daily for a week and call with the results.  3. SVT - she has not had any reoccurrence.  Continue BB.  I am going to stop the CCB due to chronotropic incompetence which is limiting her exercise. 4. Pericardial cyst s/p resection 5. Hyperlipidemia - LDL goal < 70.  Continue statin.  Check FLP and ALT.  6. Chronic CP secondary to esophageal strictures.  She is having more problems with sharp chest pain that does not sound cardiac and is like a knife in her chest.  She just had  a cardiopulmonary stress test but was submaximal and limited by severe deconditioning and submax HR.  I will get a Lexiscan myoview to rule out ischemia but suspect this is related to her underlying esophageal problems.      Medication Adjustments/Labs and Tests Ordered: Current medicines are reviewed at length with the patient today.  Concerns regarding medicines are outlined above.  Medication changes, Labs and Tests ordered today are listed in the Patient Instructions below.  There are no Patient Instructions on file for this visit.   Signed, Fransico Him, MD  02/02/2016 10:33 AM    [facility] [address], [place], [place]  [address] Phone: [phone]; Fax: [phone]

## 2016-02-02 ENCOUNTER — Telehealth (HOSPITAL_COMMUNITY): Payer: Self-pay | Admitting: *Deleted

## 2016-02-02 ENCOUNTER — Encounter (INDEPENDENT_AMBULATORY_CARE_PROVIDER_SITE_OTHER): Payer: Self-pay

## 2016-02-02 ENCOUNTER — Ambulatory Visit (INDEPENDENT_AMBULATORY_CARE_PROVIDER_SITE_OTHER): Payer: PPO | Admitting: Cardiology

## 2016-02-02 ENCOUNTER — Encounter: Payer: Self-pay | Admitting: Cardiology

## 2016-02-02 ENCOUNTER — Other Ambulatory Visit: Payer: Self-pay | Admitting: Internal Medicine

## 2016-02-02 ENCOUNTER — Telehealth: Payer: Self-pay | Admitting: Cardiology

## 2016-02-02 VITALS — BP 122/68 | HR 76 | Ht 59.0 in | Wt 156.8 lb

## 2016-02-02 DIAGNOSIS — I471 Supraventricular tachycardia: Secondary | ICD-10-CM

## 2016-02-02 DIAGNOSIS — Q248 Other specified congenital malformations of heart: Secondary | ICD-10-CM | POA: Diagnosis not present

## 2016-02-02 DIAGNOSIS — J8410 Pulmonary fibrosis, unspecified: Secondary | ICD-10-CM | POA: Diagnosis not present

## 2016-02-02 DIAGNOSIS — R531 Weakness: Secondary | ICD-10-CM | POA: Diagnosis not present

## 2016-02-02 DIAGNOSIS — I2510 Atherosclerotic heart disease of native coronary artery without angina pectoris: Secondary | ICD-10-CM | POA: Diagnosis not present

## 2016-02-02 DIAGNOSIS — E7800 Pure hypercholesterolemia, unspecified: Secondary | ICD-10-CM

## 2016-02-02 DIAGNOSIS — I10 Essential (primary) hypertension: Secondary | ICD-10-CM

## 2016-02-02 DIAGNOSIS — J9611 Chronic respiratory failure with hypoxia: Secondary | ICD-10-CM | POA: Diagnosis not present

## 2016-02-02 DIAGNOSIS — R079 Chest pain, unspecified: Secondary | ICD-10-CM

## 2016-02-02 DIAGNOSIS — R0602 Shortness of breath: Secondary | ICD-10-CM | POA: Diagnosis not present

## 2016-02-02 MED ORDER — AMLODIPINE BESYLATE 5 MG PO TABS: 5.0000 mg | ORAL_TABLET | Freq: Every day | ORAL | 11 refills | Status: DC

## 2016-02-02 NOTE — Telephone Encounter (Signed)
New Message      Please call she missed your call why she was talking to pharmacy

## 2016-02-02 NOTE — Telephone Encounter (Signed)
Evlyn Kanner, RN   Conversation  (Newest Message First)  Ranae Palms Hasspacher, RN    02/02/16 2:40 PM  Note    Patient given detailed instructions per Myocardial Perfusion Study Information Sheet for the test on 02/04/16 at 1000. Patient notified to arrive 15 minutes early and that it is imperative to arrive on time for appointment to keep from having the test rescheduled.  If you need to cancel or reschedule your appointment, please call the office within 24 hours of your appointment. Failure to do so may result in a cancellation of your appointment, and a $50 no show fee. Patient verbalized understanding.Hasspacher, Ranae Palms

## 2016-02-02 NOTE — Patient Instructions (Signed)
Medication Instructions:  1) STOP CARDIZEM 2) START AMLODIPINE 5 mg daily  Labwork: FASTING LABS the same day as your nuclear stress test: BMET, LFTs, Lipids  Testing/Procedures: Your physician has requested that you have a lexiscan myoview. For further information please visit [email or web address]. Please follow instruction sheet, as given.  Follow-Up: Your physician wants you to follow-up in: 6 months with Dr. Theodosia Blender assistant. You will receive a reminder letter in the mail two months in advance. If you don't receive a letter, please call our office to schedule the follow-up appointment.   Your physician wants you to follow-up in: 1 year with Dr. Radford Pax. You will receive a reminder letter in the mail two months in advance. If you don't receive a letter, please call our office to schedule the follow-up appointment.   Any Other Special Instructions Will Be Listed Below (If Applicable).     If you need a refill on your cardiac medications before your next appointment, please call your pharmacy.

## 2016-02-02 NOTE — Telephone Encounter (Signed)
Patient given detailed instructions per Myocardial Perfusion Study Information Sheet for the test on 02/04/16 at 1000. Patient notified to arrive 15 minutes early and that it is imperative to arrive on time for appointment to keep from having the test rescheduled.  If you need to cancel or reschedule your appointment, please call the office within 24 hours of your appointment. Failure to do so may result in a cancellation of your appointment, and a $50 no show fee. Patient verbalized understanding.Keziah Avis, Ranae Palms

## 2016-02-04 ENCOUNTER — Other Ambulatory Visit: Payer: PPO | Admitting: *Deleted

## 2016-02-04 ENCOUNTER — Ambulatory Visit (HOSPITAL_COMMUNITY): Payer: PPO | Attending: Cardiology

## 2016-02-04 DIAGNOSIS — E7800 Pure hypercholesterolemia, unspecified: Secondary | ICD-10-CM

## 2016-02-04 DIAGNOSIS — I471 Supraventricular tachycardia: Secondary | ICD-10-CM | POA: Diagnosis not present

## 2016-02-04 DIAGNOSIS — I2510 Atherosclerotic heart disease of native coronary artery without angina pectoris: Secondary | ICD-10-CM | POA: Diagnosis not present

## 2016-02-04 DIAGNOSIS — R079 Chest pain, unspecified: Secondary | ICD-10-CM

## 2016-02-04 DIAGNOSIS — I10 Essential (primary) hypertension: Secondary | ICD-10-CM | POA: Diagnosis not present

## 2016-02-04 LAB — MYOCARDIAL PERFUSION IMAGING
CHL CUP NUCLEAR SDS: 5
CHL CUP NUCLEAR SRS: 2
CHL CUP RESTING HR STRESS: 68 {beats}/min
CSEPPHR: 96 {beats}/min
LHR: 0.33
LV dias vol: 51 mL (ref 46–106)
LVSYSVOL: 10 mL
SSS: 7
TID: 1.06

## 2016-02-04 MED ORDER — TECHNETIUM TC 99M TETROFOSMIN IV KIT
10.7000 | PACK | Freq: Once | INTRAVENOUS | Status: AC | PRN
  Administered 2016-02-04: 10.7 via INTRAVENOUS
  Filled 2016-02-04: qty 11

## 2016-02-04 MED ORDER — TECHNETIUM TC 99M TETROFOSMIN IV KIT
32.9000 | PACK | Freq: Once | INTRAVENOUS | Status: AC | PRN
  Administered 2016-02-04: 32.9 via INTRAVENOUS
  Filled 2016-02-04: qty 33

## 2016-02-04 MED ORDER — REGADENOSON 0.4 MG/5ML IV SOLN
0.4000 mg | Freq: Once | INTRAVENOUS | Status: AC
  Administered 2016-02-04: 0.4 mg via INTRAVENOUS

## 2016-02-04 MED ORDER — AMINOPHYLLINE 25 MG/ML IV SOLN
75.0000 mg | Freq: Once | INTRAVENOUS | Status: AC
Start: 2016-02-04 — End: 2016-02-04
  Administered 2016-02-04: 75 mg via INTRAVENOUS

## 2016-02-05 ENCOUNTER — Other Ambulatory Visit: Payer: PPO

## 2016-02-05 ENCOUNTER — Encounter: Payer: Self-pay | Admitting: Podiatry

## 2016-02-05 ENCOUNTER — Ambulatory Visit (INDEPENDENT_AMBULATORY_CARE_PROVIDER_SITE_OTHER): Payer: PPO | Admitting: Podiatry

## 2016-02-05 DIAGNOSIS — M722 Plantar fascial fibromatosis: Secondary | ICD-10-CM | POA: Diagnosis not present

## 2016-02-05 LAB — HEPATIC FUNCTION PANEL
ALT: 12 IU/L (ref 0–32)
AST: 14 IU/L (ref 0–40)
Albumin: 4.1 g/dL (ref 3.6–4.8)
Alkaline Phosphatase: 114 IU/L (ref 39–117)
BILIRUBIN, DIRECT: 0.13 mg/dL (ref 0.00–0.40)
Bilirubin Total: 0.4 mg/dL (ref 0.0–1.2)
TOTAL PROTEIN: 6.8 g/dL (ref 6.0–8.5)

## 2016-02-05 LAB — BASIC METABOLIC PANEL
BUN/Creatinine Ratio: 15 (ref 12–28)
BUN: 19 mg/dL (ref 8–27)
CALCIUM: 8.1 mg/dL — AB (ref 8.7–10.3)
CO2: 19 mmol/L (ref 18–29)
CREATININE: 1.24 mg/dL — AB (ref 0.57–1.00)
Chloride: 105 mmol/L (ref 96–106)
GFR, EST AFRICAN AMERICAN: 51 mL/min/{1.73_m2} — AB (ref >59)
GFR, EST NON AFRICAN AMERICAN: 44 mL/min/{1.73_m2} — AB (ref >59)
Glucose: 100 mg/dL — ABNORMAL HIGH (ref 65–99)
Potassium: 4 mmol/L (ref 3.5–5.2)
Sodium: 144 mmol/L (ref 134–144)

## 2016-02-05 LAB — LIPID PANEL
CHOL/HDL RATIO: 2.1 ratio (ref 0.0–4.4)
Cholesterol, Total: 162 mg/dL (ref 100–199)
HDL: 78 mg/dL (ref >39)
LDL CALC: 70 mg/dL (ref 0–99)
TRIGLYCERIDES: 68 mg/dL (ref 0–149)
VLDL CHOLESTEROL CAL: 14 mg/dL (ref 5–40)

## 2016-02-05 MED ORDER — TRIAMCINOLONE ACETONIDE 10 MG/ML IJ SUSP
10.0000 mg | Freq: Once | INTRAMUSCULAR | Status: AC
  Administered 2016-02-05: 10 mg

## 2016-02-05 NOTE — Progress Notes (Signed)
Subjective:     Patient ID: [name], female   DOB: 07-Jul-1945, 71 y.o.   MRN: [id number]  HPI patient states she still having some pain in her right heel at the insertional point   Review of Systems     Objective:   Physical Exam Neurovascular status intact with discomfort in the plantar right at the insertional point tendon the calcaneus with inflammation fluid around the medial band    Assessment:     Plantar fasciitis right still present but moderately improved    Plan:     Discussed physical therapy anti-inflammatories and dispensed Ace wrap for heat and ice therapy. Injected the right fascia 3 Milligan Kenalog 5 mill grams Xylocaine and reappoint to recheck

## 2016-02-06 DIAGNOSIS — L814 Other melanin hyperpigmentation: Secondary | ICD-10-CM | POA: Diagnosis not present

## 2016-02-06 DIAGNOSIS — D2262 Melanocytic nevi of left upper limb, including shoulder: Secondary | ICD-10-CM | POA: Diagnosis not present

## 2016-02-06 DIAGNOSIS — D2272 Melanocytic nevi of left lower limb, including hip: Secondary | ICD-10-CM | POA: Diagnosis not present

## 2016-02-06 DIAGNOSIS — L821 Other seborrheic keratosis: Secondary | ICD-10-CM | POA: Diagnosis not present

## 2016-02-06 DIAGNOSIS — D2239 Melanocytic nevi of other parts of face: Secondary | ICD-10-CM | POA: Diagnosis not present

## 2016-02-06 DIAGNOSIS — D224 Melanocytic nevi of scalp and neck: Secondary | ICD-10-CM | POA: Diagnosis not present

## 2016-02-06 DIAGNOSIS — D2261 Melanocytic nevi of right upper limb, including shoulder: Secondary | ICD-10-CM | POA: Diagnosis not present

## 2016-02-06 DIAGNOSIS — D225 Melanocytic nevi of trunk: Secondary | ICD-10-CM | POA: Diagnosis not present

## 2016-02-06 DIAGNOSIS — D2271 Melanocytic nevi of right lower limb, including hip: Secondary | ICD-10-CM | POA: Diagnosis not present

## 2016-02-06 DIAGNOSIS — D2222 Melanocytic nevi of left ear and external auricular canal: Secondary | ICD-10-CM | POA: Diagnosis not present

## 2016-02-06 DIAGNOSIS — L720 Epidermal cyst: Secondary | ICD-10-CM | POA: Diagnosis not present

## 2016-02-10 ENCOUNTER — Telehealth: Payer: Self-pay | Admitting: Cardiology

## 2016-02-10 NOTE — Telephone Encounter (Signed)
-----   Message from Sueanne Margarita, MD sent at 02/05/2016  9:31 AM EST ----- Stable labs - continue current meds

## 2016-02-10 NOTE — Telephone Encounter (Signed)
New message      Pt c/o BP issue: STAT if pt c/o blurred vision, one-sided weakness or slurred speech  1. What are your last 5 BP readings?  162/84, 139/83, 155/84, 142/81, 120/77, 130/75 2. Are you having any other symptoms (ex. Dizziness, headache, blurred vision, passed out)? lightheadedness  3. What is your BP issue? Calling to give bp readings Also, pt want stress test results

## 2016-02-10 NOTE — Telephone Encounter (Signed)
Informed patient of results and verbal understanding expressed.  Patient saw GI a few weeks ago - results forwarded to Dr. Henrene Pastor for review.  Patient states that while her blood pressure has improved, she has had mild lightheadedness since stopping Cardizem and starting amlodipine.  She reports she took her BP 2 hours after taking medications. She states she moves slow, especially when lying to standing.  She requests medication recommendations from Dr. Radford Pax.  BP readings below.

## 2016-02-10 NOTE — Telephone Encounter (Signed)
-----   Message from Sueanne Margarita, MD sent at 02/05/2016  9:22 AM EST ----- Please let patient know that stress test was fine.  Please refer patient back to GI

## 2016-02-11 DIAGNOSIS — M65331 Trigger finger, right middle finger: Secondary | ICD-10-CM | POA: Diagnosis not present

## 2016-02-11 DIAGNOSIS — M65841 Other synovitis and tenosynovitis, right hand: Secondary | ICD-10-CM | POA: Diagnosis not present

## 2016-02-11 NOTE — Telephone Encounter (Signed)
BP readings are fine.  Have her check her BP when she gets dizzy

## 2016-02-12 ENCOUNTER — Ambulatory Visit (INDEPENDENT_AMBULATORY_CARE_PROVIDER_SITE_OTHER): Payer: PPO | Admitting: Internal Medicine

## 2016-02-12 ENCOUNTER — Telehealth: Payer: Self-pay | Admitting: Internal Medicine

## 2016-02-12 ENCOUNTER — Encounter: Payer: Self-pay | Admitting: Internal Medicine

## 2016-02-12 VITALS — BP 126/80 | HR 86 | Ht 59.0 in | Wt 157.0 lb

## 2016-02-12 DIAGNOSIS — R0689 Other abnormalities of breathing: Secondary | ICD-10-CM

## 2016-02-12 DIAGNOSIS — R5381 Other malaise: Secondary | ICD-10-CM

## 2016-02-12 DIAGNOSIS — R0600 Dyspnea, unspecified: Secondary | ICD-10-CM | POA: Diagnosis not present

## 2016-02-12 DIAGNOSIS — I4589 Other specified conduction disorders: Secondary | ICD-10-CM | POA: Diagnosis not present

## 2016-02-12 DIAGNOSIS — R062 Wheezing: Secondary | ICD-10-CM | POA: Diagnosis not present

## 2016-02-12 MED ORDER — AMLODIPINE BESYLATE 10 MG PO TABS: 10.0000 mg | ORAL_TABLET | Freq: Every day | ORAL | 11 refills | Status: DC

## 2016-02-12 MED ORDER — MOMETASONE FUROATE 100 MCG/ACT IN AERO: 2.0000 | INHALATION_SPRAY | Freq: Two times a day (BID) | RESPIRATORY_TRACT | 0 refills | Status: DC

## 2016-02-12 NOTE — Progress Notes (Signed)
Subjective:     Patient ID: [name], female   DOB: 09/30/1945, 71 y.o.   MRN: [id number]  HPI   OV 12/31/2015  Chief Complaint  Patient presents with  . Follow-up    Pt states her breathing is unchanged since last OV. Pt states infrequent dry cough and wheezing. Pt denies CP/tightness, f/c/s.     OV 02/17/2015  Chief Complaint  Patient presents with  . Follow-up    Pt here after lung biospy. Pt c/o right lateral chest pain from the surgery, c/o chest congestion with non prod cough. Pt states her SOB is unchanged since last OV.    71 year old obese woman referred for pulm fibrosis. Hx from patient (poor hx) and chart and husband.  Office visit 11/22/2014: Dyspnea - insidious onset x several years. Progressive steadily x few years. Severe. Brought on with ltd exertion like ADLs. Relieved by rest. No associated cough or wheeze. ECHO 10/15/14 - gr1 diast dysfn and per husband recently started on lasix without improvement. Had CTA 11/9/176 - PE ruled out but per report CT shows  ILD.  Non specific scar changes on most recent CT and new RUL nodule in 2015 and 2016.   PFT 11/18/14 shows restriction with low DLCO - suggestive of ILD - FVC 70%, TLC 69%, DLCO 52% (BMI 29.67 kg/(m^2)).   Walking desat test 185 feet x 3 laps - stopped due to dyspnea prematurely at 2 laps. Also fatigue - did not desat at 2 laps.  02/17/15: Underwent right VATS, mini thoracotomy for open lung biopsy 01/24/2015 with Dr. Prescott Gum. Path with focal mild nonspecific chronic bronchiolitis and pleuritis with granulomatous features. Dyspnea improved since surgery but not back to baseline. Cough with chest congestion but unable to bring up. Pain improved from surgery. No fevers or chills. Occasional chronic chest pain. Some wheezing. She uses 2L O2 at night.   OV 05/20/2015  Chief Complaint  Patient presents with  . Follow-up    Pt states she feels her breathing has worsened since last OV. Pt c/o slight increase in SOB  with activity, mild dry cough and has occassional chest discomfort - this resolves with rest.     Follow-up dyspnea in the setting of restrictive PFTs and ILD pattern and CT chest. Surgical lung biopsy without specific ILD diagnosis  After last visit I referred her to pulmonary habitation which she says has helped her dyspnea. She did see [facility] Dr. Lauris Chroman in April 2017 and apparently has been reassured. I reviewed his notes and he did want to review her biopsy of the multidisciplinary thoracic ILD clinic. I do not have the conclusion of this but it appears from the patient and her husband who both are poor historians that they were reassured. They were even told that she might have some emphysema. In February 2017 she had admission for nonspecific weakness. At discharge she was given some albuterol which apparently helped. She is wanting to try the same  There are no other new issues.    OV 08/25/2015  Chief Complaint  Patient presents with  . Follow-up    Pt states that she does not feel she is improving. Pt has been exercising 3 days a week. Pt c/o an increase in SOB and wheeze with exertion. Pt denies cough/cp/tightness.     Follow-up dyspnea in the setting of restrictive PFTs and nocturnal desaturation with 2016 CT suggestive of ILD surgical lung biopsy without specific diagnosis.  She returns with her  husband. In April 2017 basal ILD clinic at [facility] and reassured. Since then she is attended pulmonary rehabilitation he did she tells me this does not help. She is currently working out a [employer] and this is not helping her. She still dyspneic with exertion relieved by rest. She did try Brio inhaler that I gave her and this did not help her either. There are no other new symptoms. Review of the chart shows she has chronic anemia hemoglobin 9 g percent at least since January 2017. A year ago she was a little bit higher than that. Most recently apparently  primary care physician checked her blood hemoglobin and it was low at 9 g percent. She does not know the cause. She never seen hematology. She was blood transfusions a few years ago. Let us high-resolution CT chest August 2017 read by thoracic radiologist suggested is no ILD anymore.  ONO  - positive for desats on RA - and started on 1 L [place] after this visit  OV 12/31/2015  Chief Complaint  Patient presents with  . Follow-up    Pt states her breathing is unchanged since last OV. Pt states infrequent dry cough and wheezing. Pt denies CP/tightness, f/c/s.      Follow-up dyspnea in the setting of restrictive PFTs and nocturnal desaturation with 2016 CT suggestive of ILD surgical lung biopsy - unclassifabile mil;d ILD without specific diagnosis Jan 2017 and fu CT clear august 2017  At last visit in August 2017 she still had dyspnea with the CT chest showed no interstitial lung disease. Overnight desaturation test then showed mild hypoxemia at night. We started on 1 L oxygen which she says seems to help but she is noncompliant with this. In addition I found her to be anemic with a hemoglobin less than 9 g percent in February 2017. She then followed up with a primary care physician and renal physician who has started on iron therapy. She tells me that August 2017 hemoglobin was 9 g percent. Since then after being on iron tablets hemoglobin check with her primary care physician a few weeks ago is greater than 10 g percent according to her history. I do not have those results with him. She does me despite improvement in hemoglobin her dyspnea persists. She is unable to take a shower without resting because of dyspnea. She is unable to climb a flight of stairs but she can change her clothes. Associated with the dyspnea is exertional wheeze. This no resting wheeze is no nocturnal awakening. This no edema this no chest tightness is no fever or chills. She continues to be obese and physically  deconditioned.  Exhaled nitric oxide done today for the first time 12/31/2015 -> 6 ppff and normal [noted in the past that inhaler therapy did not help her]    in past exercise therapy did not help her.   OV 02/12/2016  Chief Complaint  Patient presents with  . Follow-up    Pt here CPST. Pt denies change in SOB and wheezing since last OV and also c/o midsternal CP, pt had a stress test with cards, per pt this was normal. Pt denies cough.   Follow-up dyspnea  Here for a pulmonary stress test done 01/26/2016 off note she did have a normal nuclear medicine cardiac stress test 02/04/2016  Pulmonary stress test shows good respiratory exchange ratio of 1.129 maximal effort. Heart aerobic capacity is significantly reduced. The reason for this appears to be chronotropic incompetence along with physical deconditioning. Her  Cardizem was changed to amlodipine but this has not helped her dyspnea. She is not telling me that in the past exercise therapy help and she wants referral to pulmonary rehabilitation. Medication review shows that she is on both metoprolol and amlodipine. Husband says this is because of previous tachycardia. She also tells me that she does wheeze after shower but in the past exhaled nitric oxide was normal     has a past medical history of Anxiety; Arthritis; Carpal tunnel syndrome, bilateral (07/31/2015); Cervical spondylosis without myelopathy; Chronic kidney disease; Chronic low back pain; Coronary artery disease (CARDIOLOGIST-  DR Irish Lack); Depression; GERD (gastroesophageal reflux disease); Gross hematuria; Heart murmur; History of esophageal dilatation; History of gout; History of hiatal hernia; History of kidney stones; History of supraventricular tachycardia; Hyperlipidemia; Hypertension; Hypothyroidism; Internal hemorrhoids; Irritable bowel syndrome; Partial seizure disorder (HCC) (NEUROLOGIST-- DR Jannifer Franklin); PONV (postoperative nausea and vomiting); Pulmonary fibrosis ([place]); S/P  pericardial cyst excision; Seizures ([place]); Shortness of breath dyspnea; Trigger finger, acquired (09/30/2015); and Vitamin D deficiency.   reports that she quit smoking about 48 years ago. Her smoking use included Cigarettes. She started smoking about 51 years ago. She has a 0.90 pack-year smoking history. She has never used smokeless tobacco.  Past Surgical History:  Procedure Laterality Date  . BIOPSY OF MEDIASTINAL MASS N/A 02/05/2013   Procedure: RESECTION OF MEDIASTINAL MASS;  Surgeon: Ivin Poot, MD;  Location: [place];  Service: Thoracic;  Laterality: N/A;  . CARDIAC CATHETERIZATION Right 01/24/2015   Procedure: right femoral ARTERIAL LINE INSERTION;  Surgeon: Ivin Poot, MD;  Location: [place];  Service: Thoracic;  Laterality: Right;  . CARDIOVASCULAR STRESS TEST  11-22-2013  dr Irish Lack   normal lexiscan study/  no ischemia/  normal LVF and wall motion/ ef 81%  . COLONOSCOPY  last one 2014  . CORONARY ANGIOGRAM  03/10/2011   Procedure: CORONARY ANGIOGRAM;  Surgeon: Jettie Booze, MD;  Location: [facility] CATH LAB;  Service: Cardiovascular;;  . CORONARY ANGIOPLASTY WITH STENT PLACEMENT  11-22-2009  dr Irish Lack   Acute inferoposterior MI/  ef 60%,  PCI with DES x1 to dRCA,  25%  mLAD  . CORONARY ANGIOPLASTY WITH STENT PLACEMENT  06-26-2010  dr Daneen Schick   Cutting balloon angioplasty to dRCA with DES x1,  40% mLAD (non-obstructive CAD)  . CYSTOSCOPY WITH RETROGRADE PYELOGRAM, URETEROSCOPY AND STENT PLACEMENT Right 02/13/2014   Procedure: CYSTOSCOPY WITH RETROGRADE PYELOGRAM, URETEROSCOPY AND STENT PLACEMENT;  Surgeon: Bernestine Amass, MD;  Location: [facility];  Service: Urology;  Laterality: Right;  . ECTOPIC PREGNANCY SURGERY  YRS AGO   SALPINGECTOMY  . ESOPHAGOGASTRODUODENOSCOPY N/A 02/15/2014   Procedure: ESOPHAGOGASTRODUODENOSCOPY (EGD);  Surgeon: Jerene Bears, MD;  Location: [facility];  Service: Endoscopy;  Laterality: N/A;  . ESOPHAGOGASTRODUODENOSCOPY (EGD)  WITH ESOPHAGEAL DILATION  05-20-2011  . HOLMIUM LASER APPLICATION Right [id number]   Procedure: HOLMIUM LASER APPLICATION;  Surgeon: Bernestine Amass, MD;  Location[facility];  Service: Urology;  Laterality: Right;  . LEFT HEART CATHETERIZATION WITH CORONARY ANGIOGRAM N/A 03/14/2012   Procedure: LEFT HEART CATHETERIZATION WITH CORONARY ANGIOGRAM;  Surgeon: Sueanne Margarita, MD;  Location: [place] CATH LAB;  Service: Cardiovascular;  Laterality: N/A;  Normal LM,  50% pLAD,  70% mLAD,  D2 50-70%, very tortuous LAD,  70-82% ostial LCFX,  50% in-stent restenosis of  dRCA and mRCA  stent and 50-70% ostial PDA,  normal LVSF, ef 60%  . LUNG BIOPSY Right 01/24/2015   Procedure: RIGHT  LUNG BIOPSY;  Surgeon: Ivin Poot, MD;  Location: [place];  Service: Thoracic;  Laterality: Right;  . MEDIASTERNOTOMY N/A 02/05/2013   Procedure: MEDIAN STERNOTOMY;  Surgeon: Ivin Poot, MD;  Location: [place];  Service: Thoracic;  Laterality: N/A;  . NEEDLE GUIDED EXCISION BREAST CALCIFICATIONS Right 08-09-2008  . PERCUTANEOUS CORONARY STENT INTERVENTION (PCI-S) N/A 04/03/2012   Procedure: PERCUTANEOUS CORONARY STENT INTERVENTION (PCI-S);  Surgeon: Jettie Booze, MD;  Location: [facility] CATH LAB;  Service: Cardiovascular;  Laterality: N/A;   Successful PCI  mLAD with 2.75x12 Promus stent, postdilated to >0.21mm  . THORACOTOMY Right 01/24/2015   Procedure: RIGHT MINI/LIMITED THORACOTOMY;  Surgeon: Ivin Poot, MD;  Location: [place];  Service: Thoracic;  Laterality: Right;  . TRANSTHORACIC ECHOCARDIOGRAM  11-17-2011   grade I diastolic dysfunction/  ef 55-60%  . VIDEO BRONCHOSCOPY N/A 01/24/2015   Procedure: RIGHT VIDEO BRONCHOSCOPY;  Surgeon: Ivin Poot, MD;  [facility];  Service: Thoracic;  Laterality: N/A;    Allergies  Allergen Reactions  . Tape Rash    ? Clear tape    Immunization History  Administered Date(s) Administered  . Influenza, High Dose Seasonal PF 10/12/2015  . Influenza,inj,Quad  PF,36+ Mos 09/23/2014  . Influenza-Unspecified 10/11/2012  . Pneumococcal-Unspecified 11/15/2009  . Zoster 02/07/2012    Family History  Problem Relation Age of Onset  . Breast cancer Sister   . Cancer Sister     breast  . Heart disease Father   . Hypertension Father   . Emphysema Father   . Coronary artery disease Father   . Diabetes Mellitus I Father   . Heart attack Mother   . CVA Mother   . Breast cancer      niece  . Colon cancer Neg Hx      Current Outpatient Prescriptions:  .  ALPRAZolam (XANAX) 0.25 MG tablet, Take 0.25 mg by mouth at bedtime., Disp: , Rfl:  .  amLODipine (NORVASC) 5 MG tablet, Take 1 tablet (5 mg total) by mouth daily., Disp: 30 tablet, Rfl: 11 .  aspirin EC 81 MG tablet, Take 81 mg by mouth daily., Disp: , Rfl:  .  calcitRIOL (ROCALTROL) 0.5 MCG capsule, Take 0.5 mcg by mouth daily., Disp: , Rfl:  .  clopidogrel (PLAVIX) 75 MG tablet, TAKE 1 TABLET (75 MG TOTAL) BY MOUTH DAILY WITH BREAKFAST., Disp: 30 tablet, Rfl: 10 .  doxazosin (CARDURA) 2 MG tablet, TAKE 1 TABLET (2 MG TOTAL) BY MOUTH DAILY., Disp: 30 tablet, Rfl: 8 .  ferrous sulfate 325 (65 FE) MG tablet, Take 325 mg by mouth., Disp: , Rfl:  .  furosemide (LASIX) 20 MG tablet, TAKE 1 TABLET (20 MG TOTAL) BY MOUTH EVERY OTHER DAY., Disp: 15 tablet, Rfl: 10 .  HYDROcodone-acetaminophen (NORCO) 10-325 MG tablet, Take 1 tablet by mouth every 6 (six) hours as needed., Disp: , Rfl:  .  isosorbide mononitrate (IMDUR) 60 MG 24 hr tablet, TAKE 1 AND 1/2 TABLETS BY MOUTH EVERY DAY, Disp: 45 tablet, Rfl: 5 .  levETIRAcetam (KEPPRA) 500 MG tablet, Take 1 tablet (500 mg total) by mouth 2 (two) times daily., Disp: 60 tablet, Rfl: 11 .  levothyroxine (SYNTHROID, LEVOTHROID) 112 MCG tablet, Take 112 mcg by mouth daily., Disp: , Rfl: 5 .  losartan (COZAAR) 100 MG tablet, Take 100 mg by mouth every morning. , Disp: , Rfl:  .  metoprolol succinate (TOPROL-XL) 50 MG 24 hr tablet, Take 50 mg by mouth daily. Take with  or  immediately following a meal., Disp: , Rfl:  .  NITROSTAT 0.4 MG SL tablet, PLACE 1 TABLET (0.4 MG TOTAL) UNDER THE TONGUE EVERY 5 (FIVE) MINUTES AS NEEDED FOR CHEST PAIN., Disp: 25 tablet, Rfl: 3 .  pantoprazole (PROTONIX) 40 MG tablet, Take 1 tablet (40 mg total) by mouth 2 (two) times daily., Disp: 60 tablet, Rfl: 11 .  PARoxetine (PAXIL) 30 MG tablet, Take 30 mg by mouth daily., Disp: , Rfl:  .  potassium chloride SA (K-DUR,KLOR-CON) 20 MEQ tablet, Take 1 tablet (20 mEq total) by mouth every other day. Take with Lasix., Disp: 30 tablet, Rfl: 6 .  rosuvastatin (CRESTOR) 20 MG tablet, TAKE 1 TABLET (20 MG TOTAL) BY MOUTH EVERY MORNING., Disp: 90 tablet, Rfl: 3   Review of Systems     Objective:   Physical Exam Vitals:   02/12/16 1055  BP: 126/80  Pulse: 86  SpO2: 95%  Weight: 157 lb (71.2 kg)  Height: 4\' 11"  (1.499 m)   Estimated body mass index is 31.71 kg/m as calculated from the following:   Height as of this encounter: 4\' 11"  (1.499 m).   Weight as of this encounter: 157 lb (71.2 kg).  Discussion only visit  Focused lung exam shows no wheeze    Assessment:       ICD-9-CM ICD-10-CM   1. Dyspnea and respiratory abnormality 786.09 R06.00     R06.89   2. Chronotropic incompetence 426.89 I45.89   3. Physical deconditioning 799.3 R53.81   4. Wheezing 786.07 R06.2        Plan:      Will ask Dr Radford Pax to reduce toprol or stop it completely - explained dizzines when she stopped cardizem unlikely due to dc cardizem Refer pulmonary rehab Try inhaled steroid sample  followup  3 months or sooner     (> 50% of this 15 min visit spent in face to face counseling or/and coordination of care)    Dr. Brand Males, M.D., [facility].C.P Pulmonary and Critical Care Medicine Staff Physician [place] [facility] Pager: [phone], If no answer or between  15:00h - 7:00h: call [phone]  02/12/2016 11:17 AM

## 2016-02-12 NOTE — Telephone Encounter (Signed)
Instructed patient to decrease Toprol to 25 mg daily tomorrow and Saturday, then take 25 mg Monday and Wednesday then STOP. On Thursday, she will increase Amlodipine to 10 mg daily. OV scheduled with Melina Copa 2/14.  Patient understands to call if she has any further symptoms prior to appointment and to check BP if symptoms occur. Patient agrees with treatment plan.

## 2016-02-12 NOTE — Patient Instructions (Addendum)
ICD-9-CM ICD-10-CM   1. Dyspnea and respiratory abnormality 786.09 R06.00     R06.89   2. Chronotropic incompetence 426.89 I45.89   3. Physical deconditioning 799.3 R53.81   4. Wheezing 786.07 R06.2    Will ask Dr Radford Pax to reduce toprol or stop it completely Refer pulmonary rehab Try inhaled steroid sample  followup  3 months or sooner

## 2016-02-12 NOTE — Telephone Encounter (Signed)
Hi Traci  Do you think you can stop her toprol completely and see if dyspnea better. I explained that dizziness after dc cardizem unlikely because of that  Thanks  Dr. Brand Males, M.D., [facility].C.P Pulmonary and Critical Care Medicine Staff Physician [place] [facility] Pager: [phone], If no answer or between  15:00h - 7:00h: call [phone]  02/12/2016 11:19 AM

## 2016-02-12 NOTE — Telephone Encounter (Signed)
Katy,  Please have patient decrease toprol to 25mg  daily for 2 days then every other day for 2 days then stop and on the day she stops increase amlodipine to 10mg  daily and followup with PA in 1 week after stopping toprol to see if SOB has improved and make sure BP is controlled

## 2016-02-12 NOTE — Telephone Encounter (Signed)
See 2/1 phone note  

## 2016-02-17 ENCOUNTER — Telehealth (HOSPITAL_COMMUNITY): Payer: Self-pay | Admitting: Family Medicine

## 2016-02-19 ENCOUNTER — Emergency Department (HOSPITAL_COMMUNITY): Payer: PPO

## 2016-02-19 ENCOUNTER — Encounter: Payer: Self-pay | Admitting: Physician Assistant

## 2016-02-19 DIAGNOSIS — Z87891 Personal history of nicotine dependence: Secondary | ICD-10-CM | POA: Diagnosis not present

## 2016-02-19 DIAGNOSIS — N189 Chronic kidney disease, unspecified: Secondary | ICD-10-CM | POA: Diagnosis not present

## 2016-02-19 DIAGNOSIS — E039 Hypothyroidism, unspecified: Secondary | ICD-10-CM | POA: Diagnosis not present

## 2016-02-19 DIAGNOSIS — Z7982 Long term (current) use of aspirin: Secondary | ICD-10-CM | POA: Diagnosis not present

## 2016-02-19 DIAGNOSIS — Z79899 Other long term (current) drug therapy: Secondary | ICD-10-CM | POA: Diagnosis not present

## 2016-02-19 DIAGNOSIS — I129 Hypertensive chronic kidney disease with stage 1 through stage 4 chronic kidney disease, or unspecified chronic kidney disease: Secondary | ICD-10-CM | POA: Diagnosis not present

## 2016-02-19 DIAGNOSIS — R079 Chest pain, unspecified: Secondary | ICD-10-CM | POA: Insufficient documentation

## 2016-02-19 DIAGNOSIS — R0602 Shortness of breath: Secondary | ICD-10-CM | POA: Diagnosis not present

## 2016-02-19 DIAGNOSIS — Z5321 Procedure and treatment not carried out due to patient leaving prior to being seen by health care provider: Secondary | ICD-10-CM | POA: Diagnosis not present

## 2016-02-19 LAB — BASIC METABOLIC PANEL
ANION GAP: 15 (ref 5–15)
BUN: 10 mg/dL (ref 6–20)
CALCIUM: 8.3 mg/dL — AB (ref 8.9–10.3)
CO2: 22 mmol/L (ref 22–32)
CREATININE: 1.3 mg/dL — AB (ref 0.44–1.00)
Chloride: 105 mmol/L (ref 101–111)
GFR, EST AFRICAN AMERICAN: 47 mL/min — AB (ref >60)
GFR, EST NON AFRICAN AMERICAN: 41 mL/min — AB (ref >60)
Glucose, Bld: 133 mg/dL — ABNORMAL HIGH (ref 65–99)
Potassium: 3.3 mmol/L — ABNORMAL LOW (ref 3.5–5.1)
SODIUM: 142 mmol/L (ref 135–145)

## 2016-02-19 LAB — CBC
HCT: 38.9 % (ref 36.0–46.0)
HEMOGLOBIN: 13 g/dL (ref 12.0–15.0)
MCH: 28.9 pg (ref 26.0–34.0)
MCHC: 33.4 g/dL (ref 30.0–36.0)
MCV: 86.4 fL (ref 78.0–100.0)
PLATELETS: 310 10*3/uL (ref 150–400)
RBC: 4.5 MIL/uL (ref 3.87–5.11)
RDW: 13.4 % (ref 11.5–15.5)
WBC: 6.9 10*3/uL (ref 4.0–10.5)

## 2016-02-19 LAB — I-STAT TROPONIN, ED: Troponin i, poc: 0.01 ng/mL (ref 0.00–0.08)

## 2016-02-19 NOTE — ED Notes (Addendum)
Patient and husband up to desk.  Handed me his stickers and stated they were headed home.  This RN asked them to wait while she had a moment to review their chart.  Patient's husband kept walking with wheelchair and stated "no thank you, we're going to take off".  This RN unable to update patient/family or attempt to ask them to stay.  Will d/c

## 2016-02-19 NOTE — ED Notes (Signed)
Warm blankets handed out and delay explanation provided to all patients in lobby.   

## 2016-02-19 NOTE — ED Triage Notes (Signed)
Pt complaining of fast heart beat. Pt also complaining of SOB. Per family, pt recently removed from Labetalol and cardizem. Pt states started taking amlodipine. Pt states has spells like this in evening.

## 2016-02-20 ENCOUNTER — Emergency Department (HOSPITAL_COMMUNITY)
Admission: EM | Admit: 2016-02-20 | Discharge: 2016-02-20 | Disposition: A | Discharge status: Home / Self Care | Payer: PPO | Attending: Emergency Medicine | Admitting: Emergency Medicine

## 2016-02-23 ENCOUNTER — Telehealth: Payer: Self-pay

## 2016-02-23 DIAGNOSIS — R000 Tachycardia, unspecified: Secondary | ICD-10-CM | POA: Diagnosis not present

## 2016-02-23 DIAGNOSIS — I2510 Atherosclerotic heart disease of native coronary artery without angina pectoris: Secondary | ICD-10-CM | POA: Diagnosis not present

## 2016-02-23 DIAGNOSIS — N183 Chronic kidney disease, stage 3 (moderate): Secondary | ICD-10-CM | POA: Diagnosis not present

## 2016-02-23 DIAGNOSIS — I10 Essential (primary) hypertension: Secondary | ICD-10-CM | POA: Diagnosis not present

## 2016-02-23 DIAGNOSIS — K219 Gastro-esophageal reflux disease without esophagitis: Secondary | ICD-10-CM | POA: Diagnosis not present

## 2016-02-23 DIAGNOSIS — F329 Major depressive disorder, single episode, unspecified: Secondary | ICD-10-CM | POA: Diagnosis not present

## 2016-02-23 DIAGNOSIS — E7800 Pure hypercholesterolemia, unspecified: Secondary | ICD-10-CM | POA: Diagnosis not present

## 2016-02-23 DIAGNOSIS — E039 Hypothyroidism, unspecified: Secondary | ICD-10-CM | POA: Diagnosis not present

## 2016-02-23 NOTE — Telephone Encounter (Signed)
-----   Message from Sueanne Margarita, MD sent at 02/19/2016 10:15 PM EST ----- Please find out If patient is still having chest pain

## 2016-02-23 NOTE — Telephone Encounter (Signed)
Patient denies CP but states she went to the ED with racing HR, dizziness and lightheadedness 2/9.  Before going to the ED, she couldn't walk and everything looked blurry. Her husband checked her HR and it was 190's. They called PCP and were instructed to go to ED. They went to ED, got a chest xray and labs drawn. Patient states they sat for 4 hours, she felt better, and they left without any results. She saw her PCP today for evaluation. Her HR was in the 90s. PCP gave her Bystolic 5 mg to take PRN if HR >120. Labs in ED showed K of 3.3. Patient states she is currently taking KCL 40 meq qod. Instructed her to take an extra 40 meq tomorrow (her "off day").  She is currently asymptomatic and her HR is in the 90s.  Instructed her to take it easy over the next couple days. Confirmed OV with Dayna Dunn 2/14. Patient agrees with treatment plan.

## 2016-02-24 ENCOUNTER — Encounter: Payer: Self-pay | Admitting: Physician Assistant

## 2016-02-24 NOTE — Progress Notes (Signed)
Cardiology Office Note    Date:  02/25/2016  ID:  [name], DOB 1945/05/19, MRN [id number] PCP:  Reginia Naas, MD  Cardiologist:  Dr. Radford Pax Pulmonologist: Dr. Chase Caller Nephrologist: Dr. Olivia Mackie   Chief Complaint: fast heart beat  History of Present Illness:  [name] is a 71 y.o. female with complex history of CAD (inf-post MI 2011 s/p DES to RCA, DES to RCA 06/2010, DES to LAD 2014), HTN, obesity, CKD III, pericardial cyst s/p resection, dyslipidemia, ILD s/p VATS, chronic chest pain, esophageal strictures s/p dilitation, dizziness (previously due to orthostatic hypotension), remote PSVT, seizure disorder who presents for evaluation of elevated HR.   In addition to prior angina, she has a history of chronic CP with esophageal strictures and has had esophageal dilatation. She underwent VATS procedure via mini thoracotomy syndrome with open lung bx and was diagnosed with interstitial lung disease and has been followed by Dr. Chase Caller. Last cath 03/2012 showed 50% prox and 70% mLD, 50-70% distal LAD, 70-80% ostial LCx, 50% ISR of mid-distal RC, 50-70% ostial PD, LVEDP 16. Imdur was increased but she ultimately went on to have DES to LAD a few weeks later. 2D Echo 10/2014: EF 55-60%, grade 1 DD, ca++ AV, mild MR, trivial pericardial effusion. She has had additional atypical chronic CP that has been felt to be noncardiac which recently started to reoccur. She underwent CPX 01/2016 showing dyspnea due to poor heart rate response (chronotropic incompetence) and deconditioning. She saw Dr. Radford Pax who changed her Cardizem (240mg ) to amlodipine due to the chronotropic incompetence. The patient was still having atypical chest pain (sharp, stabbing) prompting nuclear stress test on 02/04/16 which was normal, EF 80%. Per subsequent phone notes, metoprolol was stopped at the request of pulmonary and amlodipine was titrated.   Since these above med changes, she hasn't seen much improvement in  her DOE. She has, however, begun to notice episodes of intermittent dizziness associated with nausea and atypical chest discomfort like her heart is going to beat out of her chest. The first of these happened on 02/19/16. Her husband (who takes very diligent notes of each detail) checked her HR which was in the 180s. He took her to the ER but by the time she got checked in she had started to feel better with HR resolving into the 100-120 range. Upon resolution of her elevated HR, her symptoms abated. EKG showed sinus tach 122bpm. They left without being seen due to the wait. However, screening labs showed Hgb 13 (prev 8-10), K 3.3, Cr 1.3 (baseline 1.1-1.2), troponin neg. She has since had 2 more episodes lasting a few minutes with HR 180-190. Her PCP gave her a sample bottle of Bystolic 5mg  to take daily PRN for elevated HR. This did help after several minutes with HR eventually coming down to normal. She feels well today without acute complaint.   Past Medical History:  Diagnosis Date  . Anxiety   . Arthritis   . Carpal tunnel syndrome, bilateral 07/31/2015  . Cervical spondylosis without myelopathy   . Chronic chest pain   . Chronic kidney disease   . Chronic low back pain   . Chronotropic incompetence    a. by CPX 01/2016, cardizem discontinued.  . CKD (chronic kidney disease), stage III   . Coronary artery disease    a. inf-post MI 2011 s/p DES to RCA. b. DES to RCA 06/2010. c. DES to LAD 2014, residual dz treated medically.  . Depression   . Esophageal  stricture   . GERD (gastroesophageal reflux disease)   . Gross hematuria   . Heart murmur   . History of esophageal dilatation    FOR STRIUCTURE  . History of gout   . History of hiatal hernia   . History of kidney stones   . History of supraventricular tachycardia    2011-- resolved  . Hyperlipidemia   . Hypertension   . Hypothyroidism   . Internal hemorrhoids   . Irritable bowel syndrome   . Obesity   . Orthostatic hypotension     . Partial seizure disorder (HCC) NEUROLOGIST-- DR WILLIS   NOCTURNAL  . PONV (postoperative nausea and vomiting)    hard time getting iv site-had to do neck stick 2 yrs ago  . Pulmonary fibrosis ([place])   . S/P pericardial cyst excision    02-05-2013  benign  . Seizures ([place])    none in yrs  . Trigger finger, acquired 09/30/2015   Right middle finger  . Vitamin D deficiency     Past Surgical History:  Procedure Laterality Date  . BIOPSY OF MEDIASTINAL MASS N/A 02/05/2013   Procedure: RESECTION OF MEDIASTINAL MASS;  Surgeon: Ivin Poot, MD;  Location: [place];  Service: Thoracic;  Laterality: N/A;  . CARDIAC CATHETERIZATION Right 01/24/2015   Procedure: right femoral ARTERIAL LINE INSERTION;  Surgeon: Ivin Poot, MD;  Location: [place];  Service: Thoracic;  Laterality: Right;  . CARDIOVASCULAR STRESS TEST  11-22-2013  dr Irish Lack   normal lexiscan study/  no ischemia/  normal LVF and wall motion/ ef 81%  . COLONOSCOPY  last one 2014  . CORONARY ANGIOGRAM  03/10/2011   Procedure: CORONARY ANGIOGRAM;  Surgeon: Jettie Booze, MD;  Location: [facility] CATH LAB;  Service: Cardiovascular;;  . CORONARY ANGIOPLASTY WITH STENT PLACEMENT  11-22-2009  dr Irish Lack   Acute inferoposterior MI/  ef 60%,  PCI with DES x1 to dRCA,  25%  mLAD  . CORONARY ANGIOPLASTY WITH STENT PLACEMENT  06-26-2010  dr Daneen Schick   Cutting balloon angioplasty to dRCA with DES x1,  40% mLAD (non-obstructive CAD)  . CYSTOSCOPY WITH RETROGRADE PYELOGRAM, URETEROSCOPY AND STENT PLACEMENT Right 02/13/2014   Procedure: CYSTOSCOPY WITH RETROGRADE PYELOGRAM, URETEROSCOPY AND STENT PLACEMENT;  Surgeon: Bernestine Amass, MD;  Location[facility];  Service: Urology;  Laterality: Right;  . ECTOPIC PREGNANCY SURGERY  YRS AGO   SALPINGECTOMY  . ESOPHAGOGASTRODUODENOSCOPY N/A 02/15/2014   Procedure: ESOPHAGOGASTRODUODENOSCOPY (EGD);  Surgeon: Jerene Bears, MD;  Location: [facility];  Service: Endoscopy;  Laterality:  N/A;  . ESOPHAGOGASTRODUODENOSCOPY (EGD) WITH ESOPHAGEAL DILATION  05-20-2011  . HOLMIUM LASER APPLICATION Right [id number]   Procedure: HOLMIUM LASER APPLICATION;  Surgeon: Bernestine Amass, MD;  Location[facility];  Service: Urology;  Laterality: Right;  . LEFT HEART CATHETERIZATION WITH CORONARY ANGIOGRAM N/A 03/14/2012   Procedure: LEFT HEART CATHETERIZATION WITH CORONARY ANGIOGRAM;  Surgeon: Sueanne Margarita, MD;  Location: [place] CATH LAB;  Service: Cardiovascular;  Laterality: N/A;  Normal LM,  50% pLAD,  70% mLAD,  D2 50-70%, very tortuous LAD,  70-82% ostial LCFX,  50% in-stent restenosis of  dRCA and mRCA  stent and 50-70% ostial PDA,  normal LVSF, ef 60%  . LUNG BIOPSY Right 01/24/2015   Procedure: RIGHT LUNG BIOPSY;  Surgeon: Ivin Poot, MD;  Location: [place];  Service: Thoracic;  Laterality: Right;  . MEDIASTERNOTOMY N/A 02/05/2013   Procedure: MEDIAN STERNOTOMY;  Surgeon: Ivin Poot, MD;  Location: [facility];  Service: Thoracic;  Laterality: N/A;  . NEEDLE GUIDED EXCISION BREAST CALCIFICATIONS Right 08-09-2008  . PERCUTANEOUS CORONARY STENT INTERVENTION (PCI-S) N/A 04/03/2012   Procedure: PERCUTANEOUS CORONARY STENT INTERVENTION (PCI-S);  Surgeon: Jettie Booze, MD;  [facility];  Service: Cardiovascular;  Laterality: N/A;   Successful PCI  mLAD with 2.75x12 Promus stent, postdilated to >0.77mm  . THORACOTOMY Right 01/24/2015   Procedure: RIGHT MINI/LIMITED THORACOTOMY;  Surgeon: Ivin Poot, MD;  Location: [place];  Service: Thoracic;  Laterality: Right;  . TRANSTHORACIC ECHOCARDIOGRAM  11-17-2011   grade I diastolic dysfunction/  ef 55-60%  . VIDEO BRONCHOSCOPY N/A 01/24/2015   Procedure: RIGHT VIDEO BRONCHOSCOPY;  Surgeon: Ivin Poot, MD;  Location: [place] OR;  Service: Thoracic;  Laterality: N/A;    Current Medications: Current Outpatient Prescriptions  Medication Sig Dispense Refill  . ALPRAZolam (XANAX) 0.25 MG tablet Take 0.25 mg by mouth at  bedtime.    Marland Kitchen amLODipine (NORVASC) 10 MG tablet Take 1 tablet (10 mg total) by mouth daily. 30 tablet 11  . aspirin EC 81 MG tablet Take 81 mg by mouth daily.    . calcitRIOL (ROCALTROL) 0.5 MCG capsule Take 0.5 mcg by mouth daily.    . clopidogrel (PLAVIX) 75 MG tablet TAKE 1 TABLET (75 MG TOTAL) BY MOUTH DAILY WITH BREAKFAST. 30 tablet 10  . doxazosin (CARDURA) 2 MG tablet TAKE 1 TABLET (2 MG TOTAL) BY MOUTH DAILY. 30 tablet 8  . ferrous sulfate 325 (65 FE) MG tablet Take 325 mg by mouth.    . furosemide (LASIX) 40 MG tablet Take 40 mg by mouth every other day.    Marland Kitchen HYDROcodone-acetaminophen (NORCO) 10-325 MG tablet Take 1 tablet by mouth every 6 (six) hours as needed.    . isosorbide mononitrate (IMDUR) 60 MG 24 hr tablet TAKE 1 AND 1/2 TABLETS BY MOUTH EVERY DAY 45 tablet 5  . levETIRAcetam (KEPPRA) 500 MG tablet Take 1 tablet (500 mg total) by mouth 2 (two) times daily. 60 tablet 11  . levothyroxine (SYNTHROID, LEVOTHROID) 112 MCG tablet Take 112 mcg by mouth daily.  5  . losartan (COZAAR) 100 MG tablet Take 100 mg by mouth every morning.     . Mometasone Furoate (ASMANEX HFA) 100 MCG/ACT AERO Inhale 2 puffs into the lungs 2 (two) times daily. 1 Inhaler 0  . NITROSTAT 0.4 MG SL tablet PLACE 1 TABLET (0.4 MG TOTAL) UNDER THE TONGUE EVERY 5 (FIVE) MINUTES AS NEEDED FOR CHEST PAIN. 25 tablet 3  . pantoprazole (PROTONIX) 40 MG tablet Take 1 tablet (40 mg total) by mouth 2 (two) times daily. 60 tablet 11  . PARoxetine (PAXIL) 30 MG tablet Take 30 mg by mouth daily.    . potassium chloride SA (K-DUR,KLOR-CON) 20 MEQ tablet Take 40 mEq by mouth every other day.    . rosuvastatin (CRESTOR) 20 MG tablet TAKE 1 TABLET (20 MG TOTAL) BY MOUTH EVERY MORNING. 90 tablet 3   No current facility-administered medications for this visit.      Allergies:   Tape   Social History   Social History  . Marital status: Married    Spouse name: Fritz Pickerel  . Number of children: 0  . Years of education: hs    Occupational History  . Retired Retired   Social History Main Topics  . Smoking status: Former Smoker    Packs/day: 0.30    Years: 3.00    Types: Cigarettes    Start date:  11/08/1964    Quit date: 11/09/1967  . Smokeless tobacco: Never Used  . Alcohol use No  . Drug use: No  . Sexual activity: Not Asked   Other Topics Concern  . None   Social History Narrative   Lives at home, married   Left-handed   Daily caffeine use: coffee.     Family History:  The patient's family history includes Breast cancer in her sister; CVA in her mother; Cancer in her sister; Coronary artery disease in her father; Diabetes Mellitus I in her father; Emphysema in her father; Heart attack in her mother; Heart disease in her father; Hypertension in her father.   ROS:   Please see the history of present illness.  All other systems are reviewed and otherwise negative.    PHYSICAL EXAM:   VS:  BP 120/80 (BP Location: Right Arm, Patient Position: Sitting, Cuff Size: Large)   Pulse 67   Ht 4\' 11"  (1.499 m)   Wt 154 lb 12.8 oz (70.2 kg)   SpO2 99%   BMI 31.27 kg/m   BMI: Body mass index is 31.27 kg/m. GEN: Well nourished, well developed, in no acute distress  HEENT: normocephalic, atraumatic Neck: no JVD, carotid bruits, or masses Cardiac: RRR; no murmurs, rubs, or gallops, no edema  Respiratory:  clear to auscultation bilaterally, normal work of breathing GI: soft, nontender, nondistended, + BS MS: no deformity or atrophy  Skin: warm and dry, no rash Neuro:  Alert and Oriented x 3, Strength and sensation are intact, follows commands, essential tremor Psych: somewhat flattened affect  Wt Readings from Last 3 Encounters:  02/25/16 154 lb 12.8 oz (70.2 kg)  02/12/16 157 lb (71.2 kg)  02/04/16 156 lb (70.8 kg)      Studies/Labs Reviewed:   EKG:  EKG was ordered today and personally reviewed by me and demonstrates NSR 67bpm with occasional PAC, left axis deviation, prior anterior infarct,  poor R wave progression, no sig change from prior.  Recent Labs: 03/01/2015: B Natriuretic Peptide 128.8 03/03/2015: Magnesium 1.7 02/04/2016: ALT 12 02/19/2016: BUN 10; Creatinine, Ser 1.30; Hemoglobin 13.0; Platelets 310; Potassium 3.3; Sodium 142   Lipid Panel    Component Value Date/Time   CHOL 162 02/04/2016 1004   TRIG 68 02/04/2016 1004   HDL 78 02/04/2016 1004   CHOLHDL 2.1 02/04/2016 1004   CHOLHDL 1.8 06/06/2015 0928   VLDL 11 06/06/2015 0928   LDLCALC 70 02/04/2016 1004    Additional studies/ records that were reviewed today include: Summarized above.    ASSESSMENT & PLAN:   1. Heart palpitations - episodes sound suspicious for abrupt rhythm change. I wonder if she is experiencing a recurrence of SVT now that her AVN blocking medications have been removed from her regimen. However, we need to also exclude atrial fib and VT as well. Would recommend 30 day event monitor to see what we are dealing with. Ultimately she may need evaluation by EP because her situation is further complicated by evidence of chronotropic incompetence which is what prompted discontinuation of the above medications in the first place. Will recheck lytes & TSH and update echocardiogram. ER precautions reviewed. 2. Dyspnea on exertion - it remains unclear what this is fully attributed to; may be multifactorial. Recent nuc was normal as above. Check pBNP. Update 2D echo to assess not only valvular function but also pulmonary pressures. The patient is being referred for pulm rehab per husband report.  Ultimately if no other cause for her DOE is  determined, may need definitive R/LHC to reassess coronary anatomy given diffuse disease in 2014. 3. CAD - continue current regimen. See above. 4. Essential HTN - controlled. Follow. 5. Hypokalemia - recheck today. She states per recent phone note she has taken KCl 27meq BID the last 3 days instead of every other day. Will also check Mg. 6. CKD III - followed by  nephrology.  Disposition: F/u with me or Dr. Radford Pax after above studies are complete, sooner if needed.    Medication Adjustments/Labs and Tests Ordered: Current medicines are reviewed at length with the patient today.  Concerns regarding medicines are outlined above. Medication changes, Labs and Tests ordered today are summarized above and listed in the Patient Instructions accessible in Encounters.   Raechel Ache PA-C  02/25/2016 1:44 PM    [facility] [place], [place],   [address] Phone: [phone]; Fax: [phone]

## 2016-02-25 ENCOUNTER — Encounter: Payer: Self-pay | Admitting: Physician Assistant

## 2016-02-25 ENCOUNTER — Ambulatory Visit (INDEPENDENT_AMBULATORY_CARE_PROVIDER_SITE_OTHER): Payer: PPO | Admitting: Physician Assistant

## 2016-02-25 ENCOUNTER — Encounter (INDEPENDENT_AMBULATORY_CARE_PROVIDER_SITE_OTHER): Payer: Self-pay

## 2016-02-25 VITALS — BP 120/80 | HR 67 | Ht 59.0 in | Wt 154.8 lb

## 2016-02-25 DIAGNOSIS — E876 Hypokalemia: Secondary | ICD-10-CM

## 2016-02-25 DIAGNOSIS — N183 Chronic kidney disease, stage 3 unspecified: Secondary | ICD-10-CM

## 2016-02-25 DIAGNOSIS — R0602 Shortness of breath: Secondary | ICD-10-CM | POA: Diagnosis not present

## 2016-02-25 DIAGNOSIS — R002 Palpitations: Secondary | ICD-10-CM | POA: Diagnosis not present

## 2016-02-25 DIAGNOSIS — I2510 Atherosclerotic heart disease of native coronary artery without angina pectoris: Secondary | ICD-10-CM | POA: Diagnosis not present

## 2016-02-25 DIAGNOSIS — I10 Essential (primary) hypertension: Secondary | ICD-10-CM | POA: Diagnosis not present

## 2016-02-25 MED ORDER — NEBIVOLOL HCL 5 MG PO TABS: ORAL_TABLET | ORAL | 0 refills | Status: DC

## 2016-02-25 NOTE — Patient Instructions (Addendum)
Medication Instructions:  Your physician recommends that you continue on your current medications as directed. Please refer to the Current Medication list given to you today. It is ok to continue to use the Bystolic as needed.   Labwork: TODAY:  PRO BNP, BMET, MAG, & TSH  Testing/Procedures: Your physician has requested that you have an echocardiogram. Echocardiography is a painless test that uses sound waves to create images of your heart. It provides your doctor with information about the size and shape of your heart and how well your heart's chambers and valves are working. This procedure takes approximately one hour. There are no restrictions for this procedure.  Your physician has recommended that you wear an event monitor. Event monitors are medical devices that record the heart's electrical activity. Doctors most often [place] these monitors to diagnose arrhythmias. Arrhythmias are problems with the speed or rhythm of the heartbeat. The monitor is a small, portable device. You can wear one while you do your normal daily activities. This is usually used to diagnose what is causing palpitations/syncope (passing out).    Follow-Up: Your physician recommends that you schedule a follow-up appointment in: WITH DR. Radford Pax OR DAYNA DUNN, PA-C AFTER ALL TESTS ARE COMPLETED   Any Other Special Instructions Will Be Listed Below (If Applicable).   Cardiac Event Monitoring A cardiac event monitor is a small recording device used to help detect abnormal heart rhythms (arrhythmias). The monitor is used to record heart rhythm when noticeable symptoms such as the following occur:  Fast heartbeats (palpitations), such as heart racing or fluttering.  Dizziness.  Fainting or light-headedness.  Unexplained weakness. The monitor is wired to two electrodes placed on your chest. Electrodes are flat, sticky disks that attach to your skin. The monitor can be worn for up to 30 days. You will wear the monitor  at all times, except when bathing. How to use your cardiac event monitor A technician will prepare your chest for the electrode placement. The technician will show you how to place the electrodes, how to work the monitor, and how to replace the batteries. Take time to practice using the monitor before you leave the office. Make sure you understand how to send the information from the monitor to your health care provider. This requires a telephone with a landline, not a cell phone. You need to:  Wear your monitor at all times, except when you are in water:  Do not get the monitor wet.  Take the monitor off when bathing. Do not swim or use a hot tub with it on.  Keep your skin clean. Do not put body lotion or moisturizer on your chest.  Change the electrodes daily or any time they stop sticking to your skin. You might need to use tape to keep them on.  It is possible that your skin under the electrodes could become irritated. To keep this from happening, try to put the electrodes in slightly different places on your chest. However, they must remain in the area under your left breast and in the upper right section of your chest.  Make sure the monitor is safely clipped to your clothing or in a location close to your body that your health care provider recommends.  Press the button to record when you feel symptoms of heart trouble, such as dizziness, weakness, light-headedness, palpitations, thumping, shortness of breath, unexplained weakness, or a fluttering or racing heart. The monitor is always on and records what happened slightly before you pressed the button,  so do not worry about being too late to get good information.  Keep a diary of your activities, such as walking, doing chores, and taking medicine. It is especially important to note what you were doing when you pushed the button to record your symptoms. This will help your health care provider determine what might be contributing to your  symptoms. The information stored in your monitor will be reviewed by your health care provider alongside your diary entries.  Send the recorded information as recommended by your health care provider. It is important to understand that it will take some time for your health care provider to process the results.  Change the batteries as recommended by your health care provider. Get help right away if:  You have chest pain.  You have extreme difficulty breathing or shortness of breath.  You develop a very fast heartbeat that persists.  You develop dizziness that does not go away.  You faint or constantly feel you are about to faint. This information is not intended to replace advice given to you by your health care provider. Make sure you discuss any questions you have with your health care provider. Document Released: 10/07/2007 Document Revised: 06/05/2015 Document Reviewed: 06/26/2012 Elsevier Interactive Patient Education  2017 [employer].  Echocardiogram An echocardiogram, or echocardiography, uses sound waves (ultrasound) to produce an image of your heart. The echocardiogram is simple, painless, obtained within a short period of time, and offers valuable information to your health care provider. The images from an echocardiogram can provide information such as:  Evidence of coronary artery disease (CAD).  Heart size.  Heart muscle function.  Heart valve function.  Aneurysm detection.  Evidence of a past heart attack.  Fluid buildup around the heart.  Heart muscle thickening.  Assess heart valve function. Tell a health care provider about:  Any allergies you have.  All medicines you are taking, including vitamins, herbs, eye drops, creams, and over-the-counter medicines.  Any problems you or family members have had with anesthetic medicines.  Any blood disorders you have.  Any surgeries you have had.  Any medical conditions you have.  Whether you are  pregnant or may be pregnant. What happens before the procedure? No special preparation is needed. Eat and drink normally. What happens during the procedure?  In order to produce an image of your heart, gel will be applied to your chest and a wand-like tool (transducer) will be moved over your chest. The gel will help transmit the sound waves from the transducer. The sound waves will harmlessly bounce off your heart to allow the heart images to be captured in real-time motion. These images will then be recorded.  You may need an IV to receive a medicine that improves the quality of the pictures. What happens after the procedure? You may return to your normal schedule including diet, activities, and medicines, unless your health care provider tells you otherwise. This information is not intended to replace advice given to you by your health care provider. Make sure you discuss any questions you have with your health care provider. Document Released: 12/26/1999 Document Revised: 08/16/2015 Document Reviewed: 09/04/2012 Elsevier Interactive Patient Education  2017 [employer].     If you need a refill on your cardiac medications before your next appointment, please call your pharmacy.

## 2016-02-26 ENCOUNTER — Ambulatory Visit: Payer: PPO | Admitting: Podiatry

## 2016-02-26 ENCOUNTER — Telehealth: Payer: Self-pay | Admitting: *Deleted

## 2016-02-26 DIAGNOSIS — R790 Abnormal level of blood mineral: Secondary | ICD-10-CM

## 2016-02-26 LAB — BASIC METABOLIC PANEL
BUN / CREAT RATIO: 12 (ref 12–28)
BUN: 13 mg/dL (ref 8–27)
CALCIUM: 8 mg/dL — AB (ref 8.7–10.3)
CHLORIDE: 100 mmol/L (ref 96–106)
CO2: 23 mmol/L (ref 18–29)
CREATININE: 1.05 mg/dL — AB (ref 0.57–1.00)
GFR calc Af Amer: 63 mL/min/{1.73_m2} (ref >59)
GFR calc non Af Amer: 54 mL/min/{1.73_m2} — ABNORMAL LOW (ref >59)
GLUCOSE: 95 mg/dL (ref 65–99)
Potassium: 4.3 mmol/L (ref 3.5–5.2)
Sodium: 144 mmol/L (ref 134–144)

## 2016-02-26 LAB — PRO B NATRIURETIC PEPTIDE: NT-PRO BNP: 836 pg/mL — AB (ref 0–301)

## 2016-02-26 LAB — TSH: TSH: 0.841 u[IU]/mL (ref 0.450–4.500)

## 2016-02-26 LAB — MAGNESIUM: Magnesium: 1.1 mg/dL — ABNORMAL LOW (ref 1.6–2.3)

## 2016-02-26 MED ORDER — MAGNESIUM OXIDE 400 MG PO TABS: ORAL_TABLET | ORAL | 11 refills | Status: DC

## 2016-02-26 NOTE — Telephone Encounter (Signed)
-----   Message from Charlie Pitter, [place] sent at 02/26/2016  8:05 AM EST ----- Please call patient. Labs show stable kidney function & normal potassium level on current dose of 78meq BID so I would continue this for now. Magnesium level is extremely low. Please rx MagOx 400mg  BID x 3 days then 400mg  daily. May end up needing further adjustment but want to be cautious given her chronic kidney issues.  Recheck BMET/Mg in 5-7 days.  Please increase dietary intake of healthy sources of magnesium including leafy greens, nuts, seeds, fish, beans, whole grains, avocados, yogurt, and bananas. Lisbeth Renshaw Dunn PA-C

## 2016-02-26 NOTE — Telephone Encounter (Signed)
Pt and husband has been advised of pts lab results. They have been advised to continue K+ 20 meq bid and will start Mag Ox 400 1 bid X's 3 days then 1 daily. They will come in for lab check 03/04/16. Orders in EPIC. They verbalized appreciation and understanding.

## 2016-02-27 ENCOUNTER — Ambulatory Visit (INDEPENDENT_AMBULATORY_CARE_PROVIDER_SITE_OTHER): Payer: PPO

## 2016-02-27 DIAGNOSIS — R002 Palpitations: Secondary | ICD-10-CM

## 2016-03-04 ENCOUNTER — Other Ambulatory Visit: Payer: PPO | Admitting: *Deleted

## 2016-03-04 ENCOUNTER — Telehealth: Payer: Self-pay | Admitting: *Deleted

## 2016-03-04 DIAGNOSIS — J9611 Chronic respiratory failure with hypoxia: Secondary | ICD-10-CM | POA: Diagnosis not present

## 2016-03-04 DIAGNOSIS — R531 Weakness: Secondary | ICD-10-CM | POA: Diagnosis not present

## 2016-03-04 DIAGNOSIS — R790 Abnormal level of blood mineral: Secondary | ICD-10-CM | POA: Diagnosis not present

## 2016-03-04 DIAGNOSIS — J8410 Pulmonary fibrosis, unspecified: Secondary | ICD-10-CM | POA: Diagnosis not present

## 2016-03-04 DIAGNOSIS — R0602 Shortness of breath: Secondary | ICD-10-CM | POA: Diagnosis not present

## 2016-03-04 LAB — BASIC METABOLIC PANEL
BUN / CREAT RATIO: 17 (ref 12–28)
BUN: 18 mg/dL (ref 8–27)
CO2: 21 mmol/L (ref 18–29)
CREATININE: 1.04 mg/dL — AB (ref 0.57–1.00)
Calcium: 8.1 mg/dL — ABNORMAL LOW (ref 8.7–10.3)
Chloride: 103 mmol/L (ref 96–106)
GFR calc non Af Amer: 55 — ABNORMAL LOW (ref >59)
GFR, EST AFRICAN AMERICAN: 63 (ref >59)
GLUCOSE: 122 mg/dL — AB (ref 65–99)
Potassium: 3.8 mmol/L (ref 3.5–5.2)
SODIUM: 143 mmol/L (ref 134–144)

## 2016-03-04 LAB — MAGNESIUM: MAGNESIUM: 1.2 mg/dL — AB (ref 1.6–2.3)

## 2016-03-04 MED ORDER — MAGNESIUM OXIDE 400 MG PO TABS: 400.0000 mg | ORAL_TABLET | Freq: Two times a day (BID) | ORAL | 11 refills | Status: DC

## 2016-03-04 NOTE — Telephone Encounter (Signed)
Pt has been made aware of her lab results.  She will increase her Magnesium to bid and will recheck at her o/v. She verbalized understanding.

## 2016-03-05 DIAGNOSIS — I10 Essential (primary) hypertension: Secondary | ICD-10-CM | POA: Diagnosis not present

## 2016-03-05 DIAGNOSIS — H1132 Conjunctival hemorrhage, left eye: Secondary | ICD-10-CM | POA: Diagnosis not present

## 2016-03-15 ENCOUNTER — Ambulatory Visit (HOSPITAL_COMMUNITY): Payer: PPO | Attending: Cardiovascular Disease

## 2016-03-15 ENCOUNTER — Other Ambulatory Visit: Payer: Self-pay

## 2016-03-15 DIAGNOSIS — R002 Palpitations: Secondary | ICD-10-CM | POA: Diagnosis not present

## 2016-03-15 DIAGNOSIS — I350 Nonrheumatic aortic (valve) stenosis: Secondary | ICD-10-CM | POA: Diagnosis not present

## 2016-03-15 DIAGNOSIS — I501 Left ventricular failure: Secondary | ICD-10-CM | POA: Insufficient documentation

## 2016-03-15 DIAGNOSIS — I313 Pericardial effusion (noninflammatory): Secondary | ICD-10-CM | POA: Diagnosis not present

## 2016-03-15 DIAGNOSIS — I340 Nonrheumatic mitral (valve) insufficiency: Secondary | ICD-10-CM | POA: Diagnosis not present

## 2016-03-22 ENCOUNTER — Telehealth: Payer: Self-pay | Admitting: *Deleted

## 2016-03-22 NOTE — Telephone Encounter (Signed)
I received monitor tracing 03/20/16 03:49pm (EST)  Narrow complex tachycardia,HR 200. Symptoms dizziness, walking around.   I spoke with pt's husband --pt did note dizziness at the time of this tracing, pt took a bystolic 5mg  and symptoms resolved within 30 minutes.  [name] states pt had episode of fast heart rate yesterday around 7:45PM.  I was able to obtain tracing from 03/21/16 07:51 PM (EDT)  Narrow complex tachycardia, sitting,lightheadedness. Heart rate 180.  [name] states he recorded HR 175, BP 134/98, O2 98. [name] states pt did take Bystolic 5mg  and symptoms resolved within 30 minutes.  I reviewed tracings with Dr Radford Pax; Per Dr Janae Bridgeman 5mg  daily regularly, follow up with EP this week.   Pt is aware to take Bystolic 5mg  daily regularly, follow up with Dr [name] 03/24/16                                                                                                                                  I spoke with pt-pt is aware she should take Bystolic 5mg  daily, see Dr [name] 03/24/16.

## 2016-03-24 ENCOUNTER — Encounter: Payer: Self-pay | Admitting: Cardiology

## 2016-03-24 ENCOUNTER — Ambulatory Visit (INDEPENDENT_AMBULATORY_CARE_PROVIDER_SITE_OTHER): Payer: PPO | Admitting: Internal Medicine

## 2016-03-24 VITALS — BP 132/72 | HR 80 | Ht 59.0 in | Wt 156.4 lb

## 2016-03-24 DIAGNOSIS — I4589 Other specified conduction disorders: Secondary | ICD-10-CM

## 2016-03-24 DIAGNOSIS — R0602 Shortness of breath: Secondary | ICD-10-CM

## 2016-03-24 DIAGNOSIS — I10 Essential (primary) hypertension: Secondary | ICD-10-CM | POA: Diagnosis not present

## 2016-03-24 DIAGNOSIS — I471 Supraventricular tachycardia: Secondary | ICD-10-CM | POA: Diagnosis not present

## 2016-03-24 MED ORDER — AMLODIPINE BESYLATE 10 MG PO TABS: 5.0000 mg | ORAL_TABLET | Freq: Every day | ORAL | 11 refills | Status: DC

## 2016-03-24 MED ORDER — DILTIAZEM HCL ER COATED BEADS 240 MG PO CP24: 240.0000 mg | ORAL_CAPSULE | Freq: Every day | ORAL | 3 refills | Status: DC

## 2016-03-24 NOTE — Progress Notes (Signed)
Electrophysiology Office Note   Date:  03/24/2016   ID:  [name], DOB 07/05/1945, MRN [id number]  PCP:  Reginia Naas, MD  Cardiologist:  Dr Radford Pax Primary Electrophysiologist: Thompson Grayer, MD    Chief Complaint  Patient presents with  . Follow-up    SVT     History of Present Illness: [name] is a 71 y.o. female who presents today for electrophysiology evaluation.   The patient has a h/o multiple chronic medical issues including chronic respiratory failure.  She has severe LA enlargement.  She has chronic stable SOB.  She underwent CPX earlier this year.  Based in poor heart rate response to low level exercise, she was taken off of diltiazem and subsequently metoprolol was discontinued.  She has since noticed increasing abrupt onset/offset tachypalpitations.  She has worn a heart monitor which has documented narrow complex SVT at 180 bpm.  Though she has severe LA enlargement, I do not yet see afib.  I cannot exclude atrial flutter however as the cause for her SVT.  Today, she denies symptoms of  chest pain, shortness of breath, orthopnea, PND, lower extremity edema, claudication, dizziness, presyncope, syncope, bleeding, or neurologic sequela. The patient is tolerating medications without difficulties and is otherwise without complaint today.    Past Medical History:  Diagnosis Date  . Anxiety   . Arthritis   . Carpal tunnel syndrome, bilateral 07/31/2015  . Cervical spondylosis without myelopathy   . Chronic chest pain   . Chronic kidney disease   . Chronic low back pain   . Chronotropic incompetence    a. by CPX 01/2016, cardizem discontinued.  . CKD (chronic kidney disease), stage III   . Coronary artery disease    a. inf-post MI 2011 s/p DES to RCA. b. DES to RCA 06/2010. c. DES to LAD 2014, residual dz treated medically.  . Depression   . Esophageal stricture   . GERD (gastroesophageal reflux disease)   . Gross hematuria   . Heart murmur   .  History of esophageal dilatation    FOR STRIUCTURE  . History of gout   . History of hiatal hernia   . History of kidney stones   . History of supraventricular tachycardia    2011-- resolved  . Hyperlipidemia   . Hypertension   . Hypothyroidism   . Internal hemorrhoids   . Irritable bowel syndrome   . Obesity   . Orthostatic hypotension   . Partial seizure disorder (HCC) NEUROLOGIST-- DR WILLIS   NOCTURNAL  . PONV (postoperative nausea and vomiting)    hard time getting iv site-had to do neck stick 2 yrs ago  . Pulmonary fibrosis ([place])   . S/P pericardial cyst excision    02-05-2013  benign  . Seizures ([place])    none in yrs  . Trigger finger, acquired 09/30/2015   Right middle finger  . Vitamin D deficiency    Past Surgical History:  Procedure Laterality Date  . BIOPSY OF MEDIASTINAL MASS N/A 02/05/2013   Procedure: RESECTION OF MEDIASTINAL MASS;  Surgeon: Ivin Poot, MD;  Location: [facility];  Service: Thoracic;  Laterality: N/A;  . CARDIAC CATHETERIZATION Right 01/24/2015   Procedure: right femoral ARTERIAL LINE INSERTION;  Surgeon: Ivin Poot, MD;  Location: [facility];  Service: Thoracic;  Laterality: Right;  . CARDIOVASCULAR STRESS TEST  11-22-2013  dr Irish Lack   normal lexiscan study/  no ischemia/  normal LVF and wall motion/ ef 81%  . COLONOSCOPY  last one 2014  . CORONARY ANGIOGRAM  03/10/2011   Procedure: CORONARY ANGIOGRAM;  Surgeon: Jettie Booze, MD;  Location: [facility] CATH LAB;  Service: Cardiovascular;;  . CORONARY ANGIOPLASTY WITH STENT PLACEMENT  11-22-2009  dr Irish Lack   Acute inferoposterior MI/  ef 60%,  PCI with DES x1 to dRCA,  25%  mLAD  . CORONARY ANGIOPLASTY WITH STENT PLACEMENT  06-26-2010  dr Daneen Schick   Cutting balloon angioplasty to dRCA with DES x1,  40% mLAD (non-obstructive CAD)  . CYSTOSCOPY WITH RETROGRADE PYELOGRAM, URETEROSCOPY AND STENT PLACEMENT Right 02/13/2014   Procedure: CYSTOSCOPY WITH RETROGRADE PYELOGRAM, URETEROSCOPY AND STENT  PLACEMENT;  Surgeon: Bernestine Amass, MD;  Location: [facility];  Service: Urology;  Laterality: Right;  . ECTOPIC PREGNANCY SURGERY  YRS AGO   SALPINGECTOMY  . ESOPHAGOGASTRODUODENOSCOPY N/A 02/15/2014   Procedure: ESOPHAGOGASTRODUODENOSCOPY (EGD);  Surgeon: Jerene Bears, MD;  Location: [facility];  Service: Endoscopy;  Laterality: N/A;  . ESOPHAGOGASTRODUODENOSCOPY (EGD) WITH ESOPHAGEAL DILATION  05-20-2011  . HOLMIUM LASER APPLICATION Right 01/11/9145   Procedure: HOLMIUM LASER APPLICATION;  Surgeon: Bernestine Amass, MD;  Location: [facility];  Service: Urology;  Laterality: Right;  . LEFT HEART CATHETERIZATION WITH CORONARY ANGIOGRAM N/A 03/14/2012   Procedure: LEFT HEART CATHETERIZATION WITH CORONARY ANGIOGRAM;  Surgeon: Sueanne Margarita, MD;  Location: [facility] CATH LAB;  Service: Cardiovascular;  Laterality: N/A;  Normal LM,  50% pLAD,  70% mLAD,  D2 50-70%, very tortuous LAD,  70-82% ostial LCFX,  50% in-stent restenosis of  dRCA and mRCA  stent and 50-70% ostial PDA,  normal LVSF, ef 60%  . LUNG BIOPSY Right 01/24/2015   Procedure: RIGHT LUNG BIOPSY;  Surgeon: Ivin Poot, MD;  Location: [facility];  Service: Thoracic;  Laterality: Right;  . MEDIASTERNOTOMY N/A 02/05/2013   Procedure: MEDIAN STERNOTOMY;  Surgeon: Ivin Poot, MD;  Location: [facility];  Service: Thoracic;  Laterality: N/A;  . NEEDLE GUIDED EXCISION BREAST CALCIFICATIONS Right 08-09-2008  . PERCUTANEOUS CORONARY STENT INTERVENTION (PCI-S) N/A 04/03/2012   Procedure: PERCUTANEOUS CORONARY STENT INTERVENTION (PCI-S);  Surgeon: Jettie Booze, MD;  Location[facility] C[facility] LAB;  Service: Cardiovascular;  Laterality: N/A;   Successful PCI  mLAD with 2.75x12 Promus stent, postdilated to >0.60mm  . THORACOTOMY Right 01/24/2015   Procedure: RIGHT MINI/LIMITED THORACOTOMY;  Surgeon: Ivin Poot, MD;  Location: [facility];  Service: Thoracic;  Laterality: Right;  . TRANSTHORACIC ECHOCARDIOGRAM  11-17-2011   grade I  diastolic dysfunction/  ef 55-60%  . VIDEO BRONCHOSCOPY N/A 01/24/2015   Procedure: RIGHT VIDEO BRONCHOSCOPY;  Surgeon: Ivin Poot, MD;  Location: [facility] OR;  Service: Thoracic;  Laterality: N/A;     Current Outpatient Prescriptions  Medication Sig Dispense Refill  . ALPRAZolam (XANAX) 0.25 MG tablet Take 0.25 mg by mouth at bedtime.    Marland Kitchen amLODipine (NORVASC) 10 MG tablet Take 0.5 tablets (5 mg total) by mouth daily. 30 tablet 11  . aspirin EC 81 MG tablet Take 81 mg by mouth daily.    . calcitRIOL (ROCALTROL) 0.5 MCG capsule Take 0.5 mcg by mouth daily.    . clopidogrel (PLAVIX) 75 MG tablet TAKE 1 TABLET (75 MG TOTAL) BY MOUTH DAILY WITH BREAKFAST. 30 tablet 10  . doxazosin (CARDURA) 2 MG tablet TAKE 1 TABLET (2 MG TOTAL) BY MOUTH DAILY. 30 tablet 8  . ferrous sulfate 325 (65 FE) MG tablet Take 325 mg by mouth.    . furosemide (LASIX) 40  MG tablet Take 40 mg by mouth every other day.    Marland Kitchen HYDROcodone-acetaminophen (NORCO) 10-325 MG tablet Take 1 tablet by mouth every 6 (six) hours as needed.    . isosorbide mononitrate (IMDUR) 60 MG 24 hr tablet TAKE 1 AND 1/2 TABLETS BY MOUTH EVERY DAY 45 tablet 5  . levETIRAcetam (KEPPRA) 500 MG tablet Take 1 tablet (500 mg total) by mouth 2 (two) times daily. 60 tablet 11  . levothyroxine (SYNTHROID, LEVOTHROID) 112 MCG tablet Take 112 mcg by mouth daily.  5  . losartan (COZAAR) 100 MG tablet Take 100 mg by mouth every morning.     . magnesium oxide (MAG-OX) 400 MG tablet Take 1 tablet (400 mg total) by mouth 2 (two) times daily. 60 tablet 11  . Mometasone Furoate (ASMANEX HFA) 100 MCG/ACT AERO Inhale 2 puffs into the lungs 2 (two) times daily. 1 Inhaler 0  . nebivolol (BYSTOLIC) 5 MG tablet Take 1 tablet (5 mg total) by mouth daily.  0  . NITROSTAT 0.4 MG SL tablet PLACE 1 TABLET (0.4 MG TOTAL) UNDER THE TONGUE EVERY 5 (FIVE) MINUTES AS NEEDED FOR CHEST PAIN. 25 tablet 3  . pantoprazole (PROTONIX) 40 MG tablet Take 1 tablet (40 mg total) by mouth 2  (two) times daily. 60 tablet 11  . PARoxetine (PAXIL) 30 MG tablet Take 30 mg by mouth daily.    . potassium chloride SA (K-DUR,KLOR-CON) 20 MEQ tablet Take 40 mEq by mouth every other day.    . rosuvastatin (CRESTOR) 20 MG tablet TAKE 1 TABLET (20 MG TOTAL) BY MOUTH EVERY MORNING. 90 tablet 3  . diltiazem (CARDIZEM CD) 240 MG 24 hr capsule Take 1 capsule (240 mg total) by mouth daily. 90 capsule 3   No current facility-administered medications for this visit.     Allergies:   Tape   Social History:  The patient  reports that she quit smoking about 48 years ago. Her smoking use included Cigarettes. She started smoking about 51 years ago. She has a 0.90 pack-year smoking history. She has never used smokeless tobacco. She reports that she does not drink alcohol or use drugs.   Family History:  The patient's  family history includes Breast cancer in her sister; CVA in her mother; Cancer in her sister; Coronary artery disease in her father; Diabetes Mellitus I in her father; Emphysema in her father; Heart attack in her mother; Heart disease in her father; Hypertension in her father.    ROS:  Please see the history of present illness.   All other systems are personally reviewed and negative.    PHYSICAL EXAM: VS:  BP 132/72   Pulse 80   Ht 4\' 11"  (1.499 m)   Wt 156 lb 6 oz (70.9 kg)   SpO2 97%   BMI 31.58 kg/m  , BMI Body mass index is 31.58 kg/m. GEN: chronically ill, in no acute distress  HEENT: normal  Neck: no JVD, carotid bruits, or masses Cardiac: RRR; no murmurs, rubs, or gallops,no edema  Respiratory:  Decreased BS at bases, normal work of breathing GI: soft, nontender, nondistended, + BS MS: no deformity or atrophy  Skin: warm and dry  Neuro:  Strength and sensation are intact Psych: euthymic mood, full affect  EKG:  EKG is ordered today. The ekg ordered today is personally reviewed and shows sinus rhythm 80 bpm, PR 134 msec, QRS 72 msec, QTc 439 msec, poor R wave  progression, LAD   Recent Labs: 02/04/2016: ALT 12  02/19/2016: Hemoglobin 13.0; Platelets 310 02/25/2016: NT-Pro BNP 836; TSH 0.841 03/04/2016: BUN 18; Creatinine, Ser 1.04; Magnesium 1.2; Potassium 3.8; Sodium 143  personally reviewed   Lipid Panel     Component Value Date/Time   CHOL 162 02/04/2016 1004   TRIG 68 02/04/2016 1004   HDL 78 02/04/2016 1004   CHOLHDL 2.1 02/04/2016 1004   CHOLHDL 1.8 06/06/2015 0928   VLDL 11 06/06/2015 0928   LDLCALC 70 02/04/2016 1004   personally reviewed   Wt Readings from Last 3 Encounters:  03/24/16 156 lb 6 oz (70.9 kg)  02/25/16 154 lb 12.8 oz (70.2 kg)  02/12/16 157 lb (71.2 kg)      Other studies personally reviewed: Additional studies/ records that were reviewed today include: Dayna Dunn's notes, event monitor records, prior echo, Dr Landis Gandy notes, Dr Jani Gravel notes  Review of the above records today demonstrates: as above   ASSESSMENT AND PLAN:  1.  SVT The patient has recurrent symptomatic SVT.  I cannot exclude atrial flutter.  She has severe LA enlargement by volume and is at risk for additional atrial arrhythmias going forward.  She reports that onset of symptoms corresponds to stopping diltiazem and metoprolol.  Therapeutic strategies for supraventricular tachycardia including medicine and ablation were discussed in detail with the patient today. Risk, benefits, and alternatives to EP study and radiofrequency ablation were also discussed in detail today. AT this time, she is clear that she would like to avoid ablation.  I do feel that she is at increased risks from procedures given her comorbidities. She would prefer to restart diltiazem CD 240mg  daily today.  If her symptoms persist, then I would advise switching bisoprolol back to metoprolol when she see's Dr Radford Pax next week in follow-up.  If she continues to have episodes despite medical therapy changes above then she may be more willing to consider ablation at that time.  2.  ? Chronotropic incompetence Heart rates appear very stable.  I suspect that poor heart rate response on treadmill may have been due to low level of exercise due to a number of reasons.  As she does not feel better off of diltiazem and metoprolol, I would favor returning to her former regimen.  No indication for pacing in this patient.   3. HTN Reduce amlodipine to 5mg  daily today as we restart diltiazem Would consider stopping amlodipine upon follow-up with Dr Radford Pax if BP is stable.  Follow-up with Dr Radford Pax as scheduled next week.  I will see when needed going forward  Current medicines are reviewed at length with the patient today.   The patient does not have concerns regarding her medicines.  The following changes were made today:  none  Labs/ tests ordered today include:  Orders Placed This Encounter  Procedures  . EKG 12-Lead     Signed, Thompson Grayer, MD  03/24/2016 4:35 PM     [address] [place] [address] [phone] (office) [phone] (fax)

## 2016-03-24 NOTE — Patient Instructions (Signed)
Medication Instructions:  Your physician has recommended you make the following change in your medication:  1) Decrease Amlodipine to 5 mg daily 2) Resume Diltiazem 240 mg daily   Labwork: None ordered   Testing/Procedures: None ordered   Follow-Up: Your physician recommends that you schedule a follow-up appointment as needed    Any Other Special Instructions Will Be Listed Below (If Applicable).     If you need a refill on your cardiac medications before your next appointment, please call your pharmacy.

## 2016-03-29 ENCOUNTER — Institutional Professional Consult (permissible substitution): Payer: PPO | Admitting: Internal Medicine

## 2016-03-29 ENCOUNTER — Telehealth (HOSPITAL_COMMUNITY): Payer: Self-pay | Admitting: *Deleted

## 2016-04-01 ENCOUNTER — Ambulatory Visit (INDEPENDENT_AMBULATORY_CARE_PROVIDER_SITE_OTHER): Payer: PPO | Admitting: Cardiology

## 2016-04-01 ENCOUNTER — Encounter: Payer: Self-pay | Admitting: Cardiology

## 2016-04-01 VITALS — BP 114/64 | HR 72 | Ht 59.0 in | Wt 158.0 lb

## 2016-04-01 DIAGNOSIS — I10 Essential (primary) hypertension: Secondary | ICD-10-CM | POA: Diagnosis not present

## 2016-04-01 DIAGNOSIS — J9611 Chronic respiratory failure with hypoxia: Secondary | ICD-10-CM | POA: Diagnosis not present

## 2016-04-01 DIAGNOSIS — R531 Weakness: Secondary | ICD-10-CM | POA: Diagnosis not present

## 2016-04-01 DIAGNOSIS — J8410 Pulmonary fibrosis, unspecified: Secondary | ICD-10-CM | POA: Diagnosis not present

## 2016-04-01 DIAGNOSIS — Q248 Other specified congenital malformations of heart: Secondary | ICD-10-CM | POA: Diagnosis not present

## 2016-04-01 DIAGNOSIS — I471 Supraventricular tachycardia: Secondary | ICD-10-CM

## 2016-04-01 DIAGNOSIS — E7800 Pure hypercholesterolemia, unspecified: Secondary | ICD-10-CM | POA: Diagnosis not present

## 2016-04-01 DIAGNOSIS — I25118 Atherosclerotic heart disease of native coronary artery with other forms of angina pectoris: Secondary | ICD-10-CM | POA: Diagnosis not present

## 2016-04-01 DIAGNOSIS — R0602 Shortness of breath: Secondary | ICD-10-CM | POA: Diagnosis not present

## 2016-04-01 MED ORDER — NEBIVOLOL HCL 5 MG PO TABS: 5.0000 mg | ORAL_TABLET | Freq: Every day | ORAL | 3 refills | Status: DC

## 2016-04-01 NOTE — Patient Instructions (Signed)

## 2016-04-01 NOTE — Progress Notes (Signed)
Cardiology Office Note    Date:  04/04/2016   ID:  [name], DOB 1945/01/22, MRN [id number]  PCP:  Reginia Naas, MD  Cardiologist:  Fransico Him, MD   Chief Complaint  Patient presents with  . Coronary Artery Disease  . Hyperlipidemia  . Palpitations    History of Present Illness:  [name] is a 71 y.o. female with a history of CAD/HTN, pericardial cyst s/p resection and dyslipidemia who presents today for followup. She has a history of chronic CP with esophageal strictures and has had esophageal dilatation.She underwent VATS procedure via mini thoracotomy syndrome with open lung bx and was diagnosed with interstitial lung disease. She has chronic CP that has been felt to be noncardiac in the past and has started to reoccur.  She has chronic DOE.  She has not been able to exercise due to plantar fasciitis.  She underwent a cardiopulmonary stress test by Pulmonary to evaluate her SOB and it showed deconditioning and poor HR response to exercise.  At the request of the pulmonologist her CCB and BB were stopped as they though her SOB was due to chronotropic incompetence.    She then started having palpitations and was seen by her PCP, Dr. Tamala Julian, and was told to take bystolic PRN for palpitations.  She started having more palpitations and was told to take bystolic daily instead of PRN.  She then saw Dr. [name] who put her back on Cardizem and dropped her amlodipine to 5mg  daily.    Since her OV with Dr. [name], she has not had any further palpitations.  She says that she has had a lot of chest pain last week after her dog died and thinks it was related to stress.  She had a nuclear stress test 01/2016 that showed no ischemia for similar chest discomfort.  She denies any LE edema or syncope. She has chronic  dizziness due to orthostatic hypotension but that has improved and only occurs if she gets out of breath and hyperventilates.      Past Medical History:    Diagnosis Date  . Anxiety   . Arthritis   . Carpal tunnel syndrome, bilateral 07/31/2015  . Cervical spondylosis without myelopathy   . Chronic chest pain   . Chronic kidney disease   . Chronic low back pain   . Chronotropic incompetence   . CKD (chronic kidney disease), stage III   . Coronary artery disease    a. inf-post MI 2011 s/p DES to RCA. b. DES to RCA 06/2010. c. DES to LAD 2014, residual dz treated medically.  . Depression   . Esophageal stricture   . GERD (gastroesophageal reflux disease)   . Gross hematuria   . History of esophageal dilatation    FOR STRIUCTURE  . History of gout   . History of hiatal hernia   . History of kidney stones   . Hyperlipidemia   . Hypertension   . Hypothyroidism   . Internal hemorrhoids   . Irritable bowel syndrome   . Obesity   . Orthostatic hypotension   . Partial seizure disorder (HCC) NEUROLOGIST-- DR WILLIS   NOCTURNAL  . PONV (postoperative nausea and vomiting)    hard time getting iv site-had to do neck stick 2 yrs ago  . Pulmonary fibrosis ([place])   . S/P pericardial cyst excision    02-05-2013  benign  . Seizures ([place])    none in yrs  . SVT (supraventricular tachycardia) ([place])   .  Trigger finger, acquired 09/30/2015   Right middle finger  . Vitamin D deficiency     Past Surgical History:  Procedure Laterality Date  . BIOPSY OF MEDIASTINAL MASS N/A 02/05/2013   Procedure: RESECTION OF MEDIASTINAL MASS;  Surgeon: Ivin Poot, MD;  Location: [facility];  Service: Thoracic;  Laterality: N/A;  . CARDIAC CATHETERIZATION Right 01/24/2015   Procedure: right femoral ARTERIAL LINE INSERTION;  Surgeon: Ivin Poot, MD;  Location: [place];  Service: Thoracic;  Laterality: Right;  . CARDIOVASCULAR STRESS TEST  11-22-2013  dr Irish Lack   normal lexiscan study/  no ischemia/  normal LVF and wall motion/ ef 81%  . COLONOSCOPY  last one 2014  . CORONARY ANGIOGRAM  03/10/2011   Procedure: CORONARY ANGIOGRAM;  Surgeon: Jettie Booze,  MD;  Location: [facility] CATH LAB;  Service: Cardiovascular;;  . CORONARY ANGIOPLASTY WITH STENT PLACEMENT  11-22-2009  dr Irish Lack   Acute inferoposterior MI/  ef 60%,  PCI with DES x1 to dRCA,  25%  mLAD  . CORONARY ANGIOPLASTY WITH STENT PLACEMENT  06-26-2010  dr Daneen Schick   Cutting balloon angioplasty to dRCA with DES x1,  40% mLAD (non-obstructive CAD)  . CYSTOSCOPY WITH RETROGRADE PYELOGRAM, URETEROSCOPY AND STENT PLACEMENT Right 02/13/2014   Procedure: CYSTOSCOPY WITH RETROGRADE PYELOGRAM, URETEROSCOPY AND STENT PLACEMENT;  Surgeon: Bernestine Amass, MD;  Location[facility];  Service: Urology;  Laterality: Right;  . ECTOPIC PREGNANCY SURGERY  YRS AGO   SALPINGECTOMY  . ESOPHAGOGASTRODUODENOSCOPY N/A 02/15/2014   Procedure: ESOPHAGOGASTRODUODENOSCOPY (EGD);  Surgeon: Jerene Bears, MD;  Location: [facility];  Service: Endoscopy;  Laterality: N/A;  . ESOPHAGOGASTRODUODENOSCOPY (EGD) WITH ESOPHAGEAL DILATION  05-20-2011  . HOLMIUM LASER APPLICATION Right 02/16/3333   Procedure: HOLMIUM LASER APPLICATION;  Surgeon: Bernestine Amass, MD;  Location[facility];  Service: Urology;  Laterality: Right;  . LEFT HEART CATHETERIZATION WITH CORONARY ANGIOGRAM N/A 03/14/2012   Procedure: LEFT HEART CATHETERIZATION WITH CORONARY ANGIOGRAM;  Surgeon: Sueanne Margarita, MD;  Location: [facility] CATH LAB;  Service: Cardiovascular;  Laterality: N/A;  Normal LM,  50% pLAD,  70% mLAD,  D2 50-70%, very tortuous LAD,  70-82% ostial LCFX,  50% in-stent restenosis of  dRCA and mRCA  stent and 50-70% ostial PDA,  normal LVSF, ef 60%  . LUNG BIOPSY Right 01/24/2015   Procedure: RIGHT LUNG BIOPSY;  Surgeon: Ivin Poot, MD;  Location: [place];  Service: Thoracic;  Laterality: Right;  . MEDIASTERNOTOMY N/A 02/05/2013   Procedure: MEDIAN STERNOTOMY;  Surgeon: Ivin Poot, MD;  Location: [place];  Service: Thoracic;  Laterality: N/A;  . NEEDLE GUIDED EXCISION BREAST CALCIFICATIONS Right 08-09-2008  .  PERCUTANEOUS CORONARY STENT INTERVENTION (PCI-S) N/A 04/03/2012   Procedure: PERCUTANEOUS CORONARY STENT INTERVENTION (PCI-S);  Surgeon: Jettie Booze, MD;  Location: [facility] CATH LAB;  Service: Cardiovascular;  Laterality: N/A;   Successful PCI  mLAD with 2.75x12 Promus stent, postdilated to >0.18mm  . THORACOTOMY Right 01/24/2015   Procedure: RIGHT MINI/LIMITED THORACOTOMY;  Surgeon: Ivin Poot, MD;  Location: [place];  Service: Thoracic;  Laterality: Right;  . TRANSTHORACIC ECHOCARDIOGRAM  11-17-2011   grade I diastolic dysfunction/  ef 55-60%  . VIDEO BRONCHOSCOPY N/A 01/24/2015   Procedure: RIGHT VIDEO BRONCHOSCOPY;  Surgeon: Ivin Poot, MD;  Location: [facility] OR;  Service: Thoracic;  Laterality: N/A;    Current Medications: Current Meds  Medication Sig  . ALPRAZolam (XANAX) 0.25 MG tablet Take 0.25 mg by mouth  at bedtime.  Marland Kitchen amLODipine (NORVASC) 10 MG tablet Take 0.5 tablets (5 mg total) by mouth daily.  Marland Kitchen aspirin EC 81 MG tablet Take 81 mg by mouth daily.  . calcitRIOL (ROCALTROL) 0.5 MCG capsule Take 0.5 mcg by mouth daily.  . clopidogrel (PLAVIX) 75 MG tablet TAKE 1 TABLET (75 MG TOTAL) BY MOUTH DAILY WITH BREAKFAST.  Marland Kitchen diltiazem (CARDIZEM CD) 240 MG 24 hr capsule Take 1 capsule (240 mg total) by mouth daily.  Marland Kitchen doxazosin (CARDURA) 2 MG tablet TAKE 1 TABLET (2 MG TOTAL) BY MOUTH DAILY.  . ferrous sulfate 325 (65 FE) MG tablet Take 325 mg by mouth.  . furosemide (LASIX) 40 MG tablet Take 40 mg by mouth every other day.  Marland Kitchen HYDROcodone-acetaminophen (NORCO) 10-325 MG tablet Take 1 tablet by mouth every 6 (six) hours as needed.  . isosorbide mononitrate (IMDUR) 60 MG 24 hr tablet TAKE 1 AND 1/2 TABLETS BY MOUTH EVERY DAY  . levETIRAcetam (KEPPRA) 500 MG tablet Take 1 tablet (500 mg total) by mouth 2 (two) times daily.  Marland Kitchen levothyroxine (SYNTHROID, LEVOTHROID) 112 MCG tablet Take 112 mcg by mouth daily.  Marland Kitchen losartan (COZAAR) 100 MG tablet Take 100 mg by mouth every morning.   . magnesium  oxide (MAG-OX) 400 MG tablet Take 1 tablet (400 mg total) by mouth 2 (two) times daily.  . Mometasone Furoate (ASMANEX HFA) 100 MCG/ACT AERO Inhale 2 puffs into the lungs 2 (two) times daily.  . nebivolol (BYSTOLIC) 5 MG tablet Take 1 tablet (5 mg total) by mouth daily.  Marland Kitchen NITROSTAT 0.4 MG SL tablet PLACE 1 TABLET (0.4 MG TOTAL) UNDER THE TONGUE EVERY 5 (FIVE) MINUTES AS NEEDED FOR CHEST PAIN.  Marland Kitchen pantoprazole (PROTONIX) 40 MG tablet Take 1 tablet (40 mg total) by mouth 2 (two) times daily.  Marland Kitchen PARoxetine (PAXIL) 30 MG tablet Take 30 mg by mouth daily.  . potassium chloride SA (K-DUR,KLOR-CON) 20 MEQ tablet Take 40 mEq by mouth every other day.  . rosuvastatin (CRESTOR) 20 MG tablet TAKE 1 TABLET (20 MG TOTAL) BY MOUTH EVERY MORNING.  . [DISCONTINUED] nebivolol (BYSTOLIC) 5 MG tablet Take 1 tablet (5 mg total) by mouth daily.    Allergies:   Tape   Social History   Social History  . Marital status: Married    Spouse name: Fritz Pickerel  . Number of children: 0  . Years of education: hs   Occupational History  . Retired Retired   Social History Main Topics  . Smoking status: Former Smoker    Packs/day: 0.30    Years: 3.00    Types: Cigarettes    Start date: 11/08/1964    Quit date: 11/09/1967  . Smokeless tobacco: Never Used  . Alcohol use No  . Drug use: No  . Sexual activity: Not Asked   Other Topics Concern  . None   Social History Narrative   Lives at home, married   Left-handed   Daily caffeine use: coffee.     Family History:  The patient's family history includes Breast cancer in her sister; CVA in her mother; Cancer in her sister; Coronary artery disease in her father; Diabetes Mellitus I in her father; Emphysema in her father; Heart attack in her mother; Heart disease in her father; Hypertension in her father.   ROS:   Please see the history of present illness.    ROS All other systems reviewed and are negative.  No flowsheet data found.     PHYSICAL EXAM:  VS:   BP 114/64   Pulse 72   Ht 4\' 11"  (1.499 m)   Wt 158 lb (71.7 kg)   SpO2 98%   BMI 31.91 kg/m    GEN: Well nourished, well developed, in no acute distress  HEENT: normal  Neck: no JVD, carotid bruits, or masses Cardiac: RRR; no murmurs, rubs, or gallops,no edema.  Intact distal pulses bilaterally.  Respiratory:  clear to auscultation bilaterally, normal work of breathing GI: soft, nontender, nondistended, + BS MS: no deformity or atrophy  Skin: warm and dry, no rash Neuro:  Alert and Oriented x 3, Strength and sensation are intact Psych: euthymic mood, full affect  Wt Readings from Last 3 Encounters:  04/01/16 158 lb (71.7 kg)  03/24/16 156 lb 6 oz (70.9 kg)  02/25/16 154 lb 12.8 oz (70.2 kg)      Studies/Labs Reviewed:   EKG:  EKG is not ordered today.    Recent Labs: 02/04/2016: ALT 12 02/19/2016: Hemoglobin 13.0; Platelets 310 02/25/2016: NT-Pro BNP 836; TSH 0.841 03/04/2016: BUN 18; Creatinine, Ser 1.04; Magnesium 1.2; Potassium 3.8; Sodium 143   Lipid Panel    Component Value Date/Time   CHOL 162 02/04/2016 1004   TRIG 68 02/04/2016 1004   HDL 78 02/04/2016 1004   CHOLHDL 2.1 02/04/2016 1004   CHOLHDL 1.8 06/06/2015 0928   VLDL 11 06/06/2015 0928   LDLCALC 70 02/04/2016 1004    Additional studies/ records that were reviewed today include:  none    ASSESSMENT:    1. Paroxysmal SVT (supraventricular tachycardia) ([place])   2. Essential hypertension   3. Pericardial cyst   4. Pure hypercholesterolemia   5. Coronary artery disease of native artery of native heart with stable angina pectoris ([place])      PLAN:  In order of problems listed above:  1.   SVT - she has not had any reoccurence of her SVT since she was placed back on Cardizem. She will continue on Bystolic as well.   2.   HTN - BP is controlled on current medical regimen. She will continue on amlodipine, cardura, BB and ARB.    3.  Pericardial cysts s/p resection.  4.  Hyperlipidemia - LDL goal  < 70.  LDL was 70 on 02/04/2016.  She will continue on Crestor    5.  ASCAD with remote inf-post MI 2011 s/p DES to RCA and subsequent DES to RCA 06/2010 and DES to LAD 2014 with residual dz treated medically.  She has chronic stable angina controlled on long acting nitrates and BB.  She has chronic noncardiac CP that has been felt to be related to anxiety as well as esophageal spasm in the past.  She had not had much angina recently until last week when her dog died and she had a reoccurrence but this has settled down.  A nuclear stress test a few months ago showed no ischemia.  She will continue on statin, BB, Imdur, ASA and Plavix.     Medication Adjustments/Labs and Tests Ordered: Current medicines are reviewed at length with the patient today.  Concerns regarding medicines are outlined above.  Medication changes, Labs and Tests ordered today are listed in the Patient Instructions below.  Patient Instructions  Medication Instructions:  Your physician recommends that you continue on your current medications as directed. Please refer to the Current Medication list given to you today.   Labwork: None  Testing/Procedures: None  Follow-Up: Your physician wants you to follow-up in: 6 months  with Dr. Radford Pax. You will receive a reminder letter in the mail two months in advance. If you don't receive a letter, please call our office to schedule the follow-up appointment.   Any Other Special Instructions Will Be Listed Below (If Applicable).     If you need a refill on your cardiac medications before your next appointment, please call your pharmacy.      Signed, Fransico Him, MD  04/04/2016 4:20 PM    [facility] [address], [place], [place]  [address] Phone: [phone]; Fax: [phone]

## 2016-04-05 ENCOUNTER — Encounter (HOSPITAL_COMMUNITY)
Admission: RE | Admit: 2016-04-05 | Discharge: 2016-04-05 | Disposition: A | Discharge status: Home / Self Care | Payer: PPO | Source: Ambulatory Visit | Attending: Internal Medicine | Admitting: Internal Medicine

## 2016-04-05 ENCOUNTER — Encounter (HOSPITAL_COMMUNITY): Payer: Self-pay

## 2016-04-05 VITALS — BP 115/72 | HR 78 | Ht 59.0 in | Wt 158.3 lb

## 2016-04-05 DIAGNOSIS — R062 Wheezing: Secondary | ICD-10-CM | POA: Insufficient documentation

## 2016-04-05 DIAGNOSIS — R0689 Other abnormalities of breathing: Secondary | ICD-10-CM | POA: Insufficient documentation

## 2016-04-05 DIAGNOSIS — R0600 Dyspnea, unspecified: Secondary | ICD-10-CM

## 2016-04-05 DIAGNOSIS — R5381 Other malaise: Secondary | ICD-10-CM | POA: Diagnosis not present

## 2016-04-05 DIAGNOSIS — R600 Localized edema: Secondary | ICD-10-CM | POA: Insufficient documentation

## 2016-04-05 NOTE — Progress Notes (Signed)
[name] 71 y.o. female Pulmonary Rehab Orientation Note Patient arrived today in Cardiac and Pulmonary Rehab for orientation to Pulmonary Rehab. She was transported from [employer] via wheel chair. She does not carry portable oxygen. Per pt, she uses 1 liter of oxygen when sleeping at night. Color good, skin warm and dry. Patient is oriented to time and place. Patient's medical history, psychosocial health, and medications reviewed. Psychosocial assessment reveals pt lives with their spouse. 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  Pt hobbies include crossword puzzles and shopping. Pt reports her stress level is moderate. Areas of stress/anxiety include recent health issues and the recent passing of her loved dog.  Pt does exhibits signs of depression. Signs of depression include trouble falling asleep, poor appetite, low energy, and no desire to do anything.  PHQ2/9 score 6/18. Pt shows poor coping skills with negative outlook .  Offered emotional support and reassurance and recommended see see her PCP, Dr. Carol Ada, who is presently treating her depression.  She is taking Xanax and Paxil, Dr. Tamala Julian has already discussed with Magin that they could adjust her medications if needed. Will continue to monitor and evaluate progress toward psychosocial goal(s) of improved sleeping, appetite, and desire to do things. Physical assessment reveals heart rate is normal, breath sounds clear to auscultation, no wheezes, rales, or rhonchi. Grip strength equal, strong. Distal pulses 2+ bilateral posterior tibial pulses present without peripheral edema. Patient reports she does take medications as prescribed. Patient states she follows a Low fat, Low Sodium diet. The patient reports no specific efforts to gain or lose weight.. Patient's weight will be monitored closely. Demonstration and practice of PLB using pulse oximeter. Patient able to return  demonstration satisfactorily. Safety and hand hygiene in the exercise area reviewed with patient. Patient voices understanding of the information reviewed. Department expectations discussed with patient and achievable goals were set. The patient shows enthusiasm about attending the program and we look forward to working with this nice lady. The patient is scheduled for a 6 min walk test on Thursday, April 08, 2016 @ 3:30 pm and to begin exercise on Thursday, April 15, 2016 in the 1030 am class.   [phone]

## 2016-04-06 ENCOUNTER — Other Ambulatory Visit: Payer: Self-pay | Admitting: Cardiology

## 2016-04-07 DIAGNOSIS — F331 Major depressive disorder, recurrent, moderate: Secondary | ICD-10-CM | POA: Diagnosis not present

## 2016-04-08 ENCOUNTER — Ambulatory Visit (HOSPITAL_COMMUNITY): Payer: PPO

## 2016-04-08 ENCOUNTER — Other Ambulatory Visit: Payer: Self-pay | Admitting: *Deleted

## 2016-04-08 ENCOUNTER — Encounter (HOSPITAL_COMMUNITY): Payer: Self-pay | Admitting: *Deleted

## 2016-04-08 MED ORDER — FUROSEMIDE 40 MG PO TABS: 40.0000 mg | ORAL_TABLET | ORAL | 11 refills | Status: DC

## 2016-04-08 NOTE — Telephone Encounter (Signed)
New Message   Per pt husband pharmacy still has not received prescription refill  *STAT* If patient is at the pharmacy, call can be transferred to refill team.   1. Which medications need to be refilled? (please list name of each medication and dose if known) furosemide (LASIX) 40 MG tablet  2. Which pharmacy/location (including street and city if local pharmacy) is medication to be sent to? [id number] - [place], [place] - [address]  3. Do they need a 30 day or 90 day supply? 30 day supply

## 2016-04-13 ENCOUNTER — Ambulatory Visit (HOSPITAL_COMMUNITY): Payer: PPO

## 2016-04-13 ENCOUNTER — Encounter (HOSPITAL_COMMUNITY)
Admission: RE | Admit: 2016-04-13 | Discharge: 2016-04-13 | Disposition: A | Discharge status: Home / Self Care | Payer: PPO | Source: Ambulatory Visit | Attending: Internal Medicine | Admitting: Internal Medicine

## 2016-04-13 DIAGNOSIS — R0689 Other abnormalities of breathing: Secondary | ICD-10-CM | POA: Diagnosis not present

## 2016-04-13 DIAGNOSIS — R5381 Other malaise: Secondary | ICD-10-CM | POA: Diagnosis not present

## 2016-04-13 DIAGNOSIS — R600 Localized edema: Secondary | ICD-10-CM | POA: Diagnosis not present

## 2016-04-13 DIAGNOSIS — R0600 Dyspnea, unspecified: Secondary | ICD-10-CM

## 2016-04-13 DIAGNOSIS — R062 Wheezing: Secondary | ICD-10-CM | POA: Diagnosis not present

## 2016-04-13 DIAGNOSIS — J849 Interstitial pulmonary disease, unspecified: Secondary | ICD-10-CM

## 2016-04-13 NOTE — Progress Notes (Signed)
Pulmonary Individual Treatment Plan  Patient Details  Name: [name] MRN: [id number] Date of Birth: 1945/12/31 Referring Provider:     Pulmonary Rehab Walk Test from 04/13/2016 in [place]  Referring Provider  Dr. Chase Caller      Initial Encounter Date:    Pulmonary Rehab Walk Test from 04/13/2016 in [place]  Date  04/13/16  Referring Provider  Dr. Chase Caller      Visit Diagnosis: Dyspnea, unspecified type  ILD (interstitial lung disease) ([place])  Patient's Home Medications on Admission:   Current Outpatient Prescriptions:  .  ALPRAZolam (XANAX) 0.25 MG tablet, Take 0.25 mg by mouth at bedtime., Disp: , Rfl:  .  amLODipine (NORVASC) 10 MG tablet, Take 0.5 tablets (5 mg total) by mouth daily., Disp: 30 tablet, Rfl: 11 .  aspirin EC 81 MG tablet, Take 81 mg by mouth daily., Disp: , Rfl:  .  calcitRIOL (ROCALTROL) 0.5 MCG capsule, Take 0.5 mcg by mouth daily., Disp: , Rfl:  .  clopidogrel (PLAVIX) 75 MG tablet, TAKE 1 TABLET (75 MG TOTAL) BY MOUTH DAILY WITH BREAKFAST., Disp: 30 tablet, Rfl: 10 .  diltiazem (CARDIZEM CD) 240 MG 24 hr capsule, Take 1 capsule (240 mg total) by mouth daily., Disp: 90 capsule, Rfl: 3 .  doxazosin (CARDURA) 2 MG tablet, TAKE 1 TABLET (2 MG TOTAL) BY MOUTH DAILY., Disp: 30 tablet, Rfl: 8 .  ferrous sulfate 325 (65 FE) MG tablet, Take 325 mg by mouth., Disp: , Rfl:  .  furosemide (LASIX) 40 MG tablet, Take 1 tablet (40 mg total) by mouth every other day., Disp: 15 tablet, Rfl: 11 .  HYDROcodone-acetaminophen (NORCO) 10-325 MG tablet, Take 1 tablet by mouth every 6 (six) hours as needed., Disp: , Rfl:  .  isosorbide mononitrate (IMDUR) 60 MG 24 hr tablet, TAKE 1 AND 1/2 TABLETS BY MOUTH EVERY DAY, Disp: 45 tablet, Rfl: 5 .  levETIRAcetam (KEPPRA) 500 MG tablet, Take 1 tablet (500 mg total) by mouth 2 (two) times daily., Disp: 60 tablet, Rfl: 11 .  levothyroxine (SYNTHROID, LEVOTHROID) 112 MCG  tablet, Take 112 mcg by mouth daily., Disp: , Rfl: 5 .  losartan (COZAAR) 100 MG tablet, Take 100 mg by mouth every morning. , Disp: , Rfl:  .  magnesium oxide (MAG-OX) 400 MG tablet, Take 1 tablet (400 mg total) by mouth 2 (two) times daily., Disp: 60 tablet, Rfl: 11 .  Mometasone Furoate (ASMANEX HFA) 100 MCG/ACT AERO, Inhale 2 puffs into the lungs 2 (two) times daily., Disp: 1 Inhaler, Rfl: 0 .  nebivolol (BYSTOLIC) 5 MG tablet, Take 1 tablet (5 mg total) by mouth daily., Disp: 90 tablet, Rfl: 3 .  NITROSTAT 0.4 MG SL tablet, PLACE 1 TABLET (0.4 MG TOTAL) UNDER THE TONGUE EVERY 5 (FIVE) MINUTES AS NEEDED FOR CHEST PAIN., Disp: 25 tablet, Rfl: 3 .  pantoprazole (PROTONIX) 40 MG tablet, Take 1 tablet (40 mg total) by mouth 2 (two) times daily., Disp: 60 tablet, Rfl: 11 .  PARoxetine (PAXIL) 30 MG tablet, Take 30 mg by mouth daily., Disp: , Rfl:  .  potassium chloride SA (K-DUR,KLOR-CON) 20 MEQ tablet, Take 40 mEq by mouth every other day., Disp: , Rfl:  .  rosuvastatin (CRESTOR) 20 MG tablet, TAKE 1 TABLET (20 MG TOTAL) BY MOUTH EVERY MORNING., Disp: 90 tablet, Rfl: 3  Past Medical History: Past Medical History:  Diagnosis Date  . Anxiety   . Arthritis   . Carpal  tunnel syndrome, bilateral 07/31/2015  . Cervical spondylosis without myelopathy   . Chronic chest pain   . Chronic kidney disease   . Chronic low back pain   . Chronotropic incompetence   . CKD (chronic kidney disease), stage III   . Coronary artery disease    a. inf-post MI 2011 s/p DES to RCA. b. DES to RCA 06/2010. c. DES to LAD 2014, residual dz treated medically.  . Depression   . Esophageal stricture   . GERD (gastroesophageal reflux disease)   . Gross hematuria   . History of esophageal dilatation    FOR STRIUCTURE  . History of gout   . History of hiatal hernia   . History of kidney stones   . Hyperlipidemia   . Hypertension   . Hypothyroidism   . Internal hemorrhoids   . Irritable bowel syndrome   . Obesity    . Orthostatic hypotension   . Partial seizure disorder (HCC) NEUROLOGIST-- DR WILLIS   NOCTURNAL  . PONV (postoperative nausea and vomiting)    hard time getting iv site-had to do neck stick 2 yrs ago  . Pulmonary fibrosis ([place])   . S/P pericardial cyst excision    02-05-2013  benign  . Seizures ([place])    none in yrs  . SVT (supraventricular tachycardia) ([place])   . Trigger finger, acquired 09/30/2015   Right middle finger  . Vitamin D deficiency     Tobacco Use: History  Smoking Status  . Former Smoker  . Packs/day: 0.30  . Years: 3.00  . Types: Cigarettes  . Start date: 11/08/1964  . Quit date: 11/09/1967  Smokeless Tobacco  . Never Used    Labs: Recent Review Flowsheet Data    Labs for ITP Cardiac and Pulmonary Rehab Latest Ref Rng & Units 01/24/2015 01/24/2015 01/25/2015 06/06/2015 02/04/2016   Cholestrol 100 - 199 mg/dL - - - 147 162   LDLCALC 0 - 99 mg/dL - - - 54 70   HDL >39 mg/dL - - - 82 78   Trlycerides 0 - 149 mg/dL - - - 55 68   Hemoglobin A1c 4.8 - 5.6 % - - - - -   PHART 7.350 - 7.450 7.358 7.276(L) 7.362 - -   PCO2ART 35.0 - 45.0 mmHg 45.2(H) 56.7(H) 45.6(H) - -   HCO3 20.0 - 24.0 mEq/L 25.7(H) 26.6(H) 25.9(H) - -   TCO2 0 - 100 mmol/L 27 28 27  - -   ACIDBASEDEF 0.0 - 2.0 mmol/L - 1.0 - - -   O2SAT % 100.0 93.0 99.0 - -      Capillary Blood Glucose: Lab Results  Component Value Date   GLUCAP 102 (H) 03/03/2015   GLUCAP 79 03/02/2015   GLUCAP 107 (H) 03/01/2015   GLUCAP 110 (H) 01/26/2015   GLUCAP 119 (H) 01/26/2015     ADL UCSD:     Pulmonary Assessment Scores    Row Name 04/08/16 1503         ADL UCSD   ADL Phase Entry     SOB Score total 58        Pulmonary Function Assessment:     Pulmonary Function Assessment - 04/05/16 1027      Breath   Bilateral Breath Sounds Clear   Shortness of Breath Yes;Limiting activity;Panic with Shortness of Breath      Exercise Target Goals: Date: 04/13/16  Exercise Program Goal: Individual  exercise prescription set with THRR, safety & activity barriers. Participant demonstrates ability to  understand and report RPE using BORG scale, to self-measure pulse accurately, and to acknowledge the importance of the exercise prescription.  Exercise Prescription Goal: Starting with aerobic activity 30 plus minutes a day, 3 days per week for initial exercise prescription. Provide home exercise prescription and guidelines that participant acknowledges understanding prior to discharge.  Activity Barriers & Risk Stratification:   6 Minute Walk:     6 Minute Walk    Row Name 04/13/16 1633         6 Minute Walk   Phase Initial     Distance 1250 feet     Walk Time 6 minutes     # of Rest Breaks 0     MPH 2.36     METS 2.84     RPE 13     Perceived Dyspnea  1     Symptoms Yes (comment)     Comments shortness of breath and fatigue     Resting HR 90 bpm     Resting BP 129/84     Max Ex. HR 110 bpm     Max Ex. BP 147/84     2 Minute Post BP 147/84       Interval HR   Baseline HR 90     1 Minute HR 100     2 Minute HR 105     3 Minute HR 110     4 Minute HR 110     5 Minute HR 109     6 Minute HR 109     2 Minute Post HR 104     Interval Heart Rate? Yes       Interval Oxygen   Interval Oxygen? Yes     Baseline Oxygen Saturation % 95 %     Baseline Liters of Oxygen 0 L     1 Minute Oxygen Saturation % 97 %     1 Minute Liters of Oxygen 0 L     2 Minute Oxygen Saturation % 96 %     2 Minute Liters of Oxygen 0 L     3 Minute Oxygen Saturation % 97 %     3 Minute Liters of Oxygen 0 L     4 Minute Oxygen Saturation % 96 %     4 Minute Liters of Oxygen 0 L     5 Minute Oxygen Saturation % 96 %     5 Minute Liters of Oxygen 0 L     6 Minute Oxygen Saturation % 96 %     6 Minute Liters of Oxygen 0 L     2 Minute Post Oxygen Saturation % 97 %     2 Minute Post Liters of Oxygen 0 L        Oxygen Initial Assessment:     Oxygen Initial Assessment - 04/05/16 1029       Home Oxygen   Home Oxygen Device Home Concentrator   Sleep Oxygen Prescription Continuous   Liters per minute 1   Home Exercise Oxygen Prescription None   Home at Rest Exercise Oxygen Prescription None   Compliance with Home Oxygen Use Yes     Intervention   Short Term Goals To learn and exhibit compliance with exercise, home and travel O2 prescription   Long  Term Goals Exhibits compliance with exercise, home and travel O2 prescription      Oxygen Re-Evaluation:   Oxygen Discharge (Final Oxygen Re-Evaluation):   Initial Exercise Prescription:  Initial Exercise Prescription - 04/13/16 1600      Date of Initial Exercise RX and Referring Provider   Date 04/13/16   Referring Provider Dr. Chase Caller     NuStep   Level 1   Minutes 17   METs 1.5     Arm Ergometer   Level 1   Minutes 17     Track   Laps 6   Minutes 17     Prescription Details   Frequency (times per week) 2   Duration Progress to 45 minutes of aerobic exercise without signs/symptoms of physical distress     Intensity   THRR 40-80% of Max Heartrate 60-121   Ratings of Perceived Exertion 11-13   Perceived Dyspnea 0-4     Progression   Progression Continue progressive overload as per policy without signs/symptoms or physical distress.     Resistance Training   Training Prescription Yes   Weight orange bands   Reps 10-15      Perform Capillary Blood Glucose checks as needed.  Exercise Prescription Changes:   Exercise Comments:   Exercise Goals and Review:   Exercise Goals Re-Evaluation :   Discharge Exercise Prescription (Final Exercise Prescription Changes):   Nutrition:  Target Goals: Understanding of nutrition guidelines, daily intake of sodium 1500mg , cholesterol 200mg , calories 30% from fat and 7% or less from saturated fats, daily to have 5 or more servings of fruits and vegetables.  Biometrics:     Pre Biometrics - 04/05/16 1031      Pre Biometrics   Grip Strength  20 kg       Nutrition Therapy Plan and Nutrition Goals:   Nutrition Discharge: Rate Your Plate Scores:   Nutrition Goals Re-Evaluation:   Nutrition Goals Discharge (Final Nutrition Goals Re-Evaluation):   Psychosocial: Target Goals: Acknowledge presence or absence of significant depression and/or stress, maximize coping skills, provide positive support system. Participant is able to verbalize types and ability to use techniques and skills needed for reducing stress and depression.  Initial Review & Psychosocial Screening:   Quality of Life Scores:     Quality of Life - 04/08/16 1504      Quality of Life Scores   Health/Function Pre 16.37 %   Socioeconomic Pre 21.29 %   Psych/Spiritual Pre 18.86 %   Family Pre 22 %   GLOBAL Pre 18.52 %      PHQ-9: Recent Review Flowsheet Data    Depression screen [facility] 2/9 04/05/2016 07/21/2015 05/05/2015 04/02/2015 03/17/2015   Decreased Interest 3 0 0 0  1   Down, Depressed, Hopeless 3 0 0 0 1   PHQ - 2 Score 6 0 0 0 2   Altered sleeping 3 - 0 1 3   Tired, decreased energy 3 - 0 1 3   Change in appetite 3 - 0 0 0   Feeling bad or failure about yourself  0 - 0 0 1   Trouble concentrating 0 - 0 0 0   Moving slowly or fidgety/restless 3 - 0 0 0   Suicidal thoughts 0 - 0 0 0   PHQ-9 Score 18 - 0 2 9   Difficult doing work/chores Somewhat difficult - Not difficult at all Not difficult at all Not difficult at all     Interpretation of Total Score  Total Score Depression Severity:  1-4 = Minimal depression, 5-9 = Mild depression, 10-14 = Moderate depression, 15-19 = Moderately severe depression, 20-27 = Severe depression   Psychosocial Evaluation and Intervention:  Psychosocial Re-Evaluation:   Psychosocial Discharge (Final Psychosocial Re-Evaluation):   Education: Education Goals: Education classes will be provided on a weekly basis, covering required topics. Participant will state understanding/return demonstration of topics  presented.  Learning Barriers/Preferences:     Learning Barriers/Preferences - 04/05/16 1026      Learning Barriers/Preferences   Learning Barriers None   Learning Preferences Group Instruction;Individual Instruction;Pictoral;Skilled Demonstration;Verbal Instruction;Written Material;Video      Education Topics: Risk Factor Reduction:  -Group instruction that is supported by a PowerPoint presentation. Instructor discusses the definition of a risk factor, different risk factors for pulmonary disease, and how the heart and lungs work together.     Nutrition for Pulmonary Patient:  -Group instruction provided by PowerPoint slides, verbal discussion, and written materials to support subject matter. The instructor gives an explanation and review of healthy diet recommendations, which includes a discussion on weight management, recommendations for fruit and vegetable consumption, as well as protein, fluid, caffeine, fiber, sodium, sugar, and alcohol. Tips for eating when patients are short of breath are discussed.   PULMONARY REHAB OTHER RESPIRATORY from 06/19/2015 in [place]  Date  06/12/15  Educator  RD  Instruction Review Code  2- meets goals/outcomes      Pursed Lip Breathing:  -Group instruction that is supported by demonstration and informational handouts. Instructor discusses the benefits of pursed lip and diaphragmatic breathing and detailed demonstration on how to preform both.     PULMONARY REHAB OTHER RESPIRATORY from 06/19/2015 in [place]  Date  06/05/15  Educator  EP  Instruction Review Code  2- meets goals/outcomes      Oxygen Safety:  -Group instruction provided by PowerPoint, verbal discussion, and written material to support subject matter. There is an overview of "What is Oxygen" and "Why do we need it".  Instructor also reviews how to create a safe environment for oxygen use, the importance of using  oxygen as prescribed, and the risks of noncompliance. There is a brief discussion on traveling with oxygen and resources the patient may utilize.   PULMONARY REHAB OTHER RESPIRATORY from 06/19/2015 in [place]  Date  04/16/15  Educator  RN  Instruction Review Code  2- meets goals/outcomes      Oxygen Equipment:  -Group instruction provided by [facility] Staff utilizing handouts, written materials, and equipment demonstrations.   PULMONARY REHAB OTHER RESPIRATORY from 06/19/2015 in [place]  Date  05/01/15  Educator  [employer] rep  Instruction Review Code  2- meets goals/outcomes      Signs and Symptoms:  -Group instruction provided by written material and verbal discussion to support subject matter. Warning signs and symptoms of infection, stroke, and heart attack are reviewed and when to call the physician/911 reinforced. Tips for preventing the spread of infection discussed.   Advanced Directives:  -Group instruction provided by verbal instruction and written material to support subject matter. Instructor reviews Advanced Directive laws and proper instruction for filling out document.   Pulmonary Video:  -Group video education that reviews the importance of medication and oxygen compliance, exercise, good nutrition, pulmonary hygiene, and pursed lip and diaphragmatic breathing for the pulmonary patient.   PULMONARY REHAB OTHER RESPIRATORY from 06/19/2015 in [place]  Date  06/19/15  Instruction Review Code  2- meets goals/outcomes      Exercise for the Pulmonary Patient:  -Group instruction that is supported by a  PowerPoint presentation. Instructor discusses benefits of exercise, core components of exercise, frequency, duration, and intensity of an exercise routine, importance of utilizing pulse oximetry during exercise, safety while exercising, and options of places to exercise outside  of rehab.     PULMONARY REHAB OTHER RESPIRATORY from 06/19/2015 in [place]  Date  05/15/15  Educator  EP  Instruction Review Code  2- meets goals/outcomes      Pulmonary Medications:  -Verbally interactive group education provided by instructor with focus on inhaled medications and proper administration.   PULMONARY REHAB OTHER RESPIRATORY from 06/19/2015 in [place]  Date  04/10/15  Educator  Pharm D      Anatomy and Physiology of the Respiratory System and Intimacy:  -Group instruction provided by PowerPoint, verbal discussion, and written material to support subject matter. Instructor reviews respiratory cycle and anatomical components of the respiratory system and their functions. Instructor also reviews differences in obstructive and restrictive respiratory diseases with examples of each. Intimacy, Sex, and Sexuality differences are reviewed with a discussion on how relationships can change when diagnosed with pulmonary disease. Common sexual concerns are reviewed.   PULMONARY REHAB OTHER RESPIRATORY from 06/19/2015 in [place]  Date  05/22/15  Educator  Trish Fountain  Instruction Review Code  2- meets goals/outcomes      Knowledge Questionnaire Score:     Knowledge Questionnaire Score - 04/08/16 1503      Knowledge Questionnaire Score   Pre Score 12/13      Core Components/Risk Factors/Patient Goals at Admission:     Personal Goals and Risk Factors at Admission - 04/05/16 1031      Core Components/Risk Factors/Patient Goals on Admission   Improve shortness of breath with ADL's Yes   Intervention Provide education, individualized exercise plan and daily activity instruction to help decrease symptoms of SOB with activities of daily living.   Expected Outcomes Short Term: Achieves a reduction of symptoms when performing activities of daily living.   Develop more efficient  breathing techniques such as purse lipped breathing and diaphragmatic breathing; and practicing self-pacing with activity Yes   Increase knowledge of respiratory medications and ability to use respiratory devices properly  Yes      Core Components/Risk Factors/Patient Goals Review:    Core Components/Risk Factors/Patient Goals at Discharge (Final Review):    ITP Comments:   Comments:

## 2016-04-15 ENCOUNTER — Encounter (HOSPITAL_COMMUNITY): Payer: PPO

## 2016-04-20 ENCOUNTER — Encounter (HOSPITAL_COMMUNITY): Admission: RE | Admit: 2016-04-20 | Payer: PPO | Source: Ambulatory Visit

## 2016-04-22 ENCOUNTER — Encounter (HOSPITAL_COMMUNITY): Payer: PPO

## 2016-04-27 ENCOUNTER — Encounter (HOSPITAL_COMMUNITY)
Admission: RE | Admit: 2016-04-27 | Discharge: 2016-04-27 | Disposition: A | Discharge status: Home / Self Care | Payer: PPO | Source: Ambulatory Visit | Attending: Internal Medicine | Admitting: Internal Medicine

## 2016-04-27 VITALS — Wt 158.3 lb

## 2016-04-27 DIAGNOSIS — R0600 Dyspnea, unspecified: Secondary | ICD-10-CM

## 2016-04-27 DIAGNOSIS — R600 Localized edema: Secondary | ICD-10-CM | POA: Diagnosis not present

## 2016-04-27 DIAGNOSIS — J849 Interstitial pulmonary disease, unspecified: Secondary | ICD-10-CM

## 2016-04-27 NOTE — Progress Notes (Signed)
Daily Session Note  Patient Details  Name: [name] MRN: [id number] Date of Birth: 1945-05-28 Referring Provider:     Pulmonary Rehab Walk Test from 04/13/2016 in [place]  Referring Provider  Dr. Chase Caller      Encounter Date: 04/27/2016  Check In:     Session Check In - 04/27/16 1219      Check-In   Location [facility]   Staff Present Trish Fountain, RN, BSN;Molly diVincenzo, MS, ACSM RCEP, Exercise Physiologist;Jamol Ginyard Ysidro Evert, RN;Joan Leonia Reeves, RN, BSN   Supervising physician immediately available to respond to emergencies [facility] immediately available   Physician(s) Dr. Cathlean Sauer   Medication changes reported     No   Tobacco Cessation No Change   Warm-up and Cool-down Performed as group-led instruction   Resistance Training Performed Yes   VAD Patient? No     Pain Assessment   Currently in Pain? No/denies   Multiple Pain Sites No      Capillary Blood Glucose: No results found for this or any previous visit (from the past 24 hour(s)).      Exercise Prescription Changes - 04/27/16 1200      Response to Exercise   Blood Pressure (Admit) 104/60   Blood Pressure (Exercise) 110/60   Blood Pressure (Exit) 110/72   Heart Rate (Admit) 94 bpm   Heart Rate (Exercise) 106 bpm   Heart Rate (Exit) 83 bpm   Oxygen Saturation (Admit) 97 %   Oxygen Saturation (Exercise) 91 %   Oxygen Saturation (Exit) 92 %   Rating of Perceived Exertion (Exercise) 17   Perceived Dyspnea (Exercise) 3   Duration Progress to 45 minutes of aerobic exercise without signs/symptoms of physical distress   Intensity Other (comment)  40-80% of HRR     Progression   Progression Continue to progress workloads to maintain intensity without signs/symptoms of physical distress.     Resistance Training   Training Prescription Yes   Weight orange bands   Reps 10-15   Time 10 Minutes     NuStep   Level 3   Minutes 17   METs 2.3     Arm  Ergometer   Level 1   Minutes 17     Track   Laps 15   Minutes 17      History  Smoking Status  . Former Smoker  . Packs/day: 0.30  . Years: 3.00  . Types: Cigarettes  . Start date: 11/08/1964  . Quit date: 11/09/1967  Smokeless Tobacco  . Never Used    Goals Met:  Exercise tolerated well No report of cardiac concerns or symptoms Strength training completed today  Goals Unmet:  Not Applicable  Comments: Service time is from 1030 to 1210    Dr. Rush Farmer is Medical Director for Pulmonary Rehab at [facility].

## 2016-04-29 ENCOUNTER — Encounter (HOSPITAL_COMMUNITY)
Admission: RE | Admit: 2016-04-29 | Discharge: 2016-04-29 | Disposition: A | Discharge status: Home / Self Care | Payer: PPO | Source: Ambulatory Visit | Attending: Internal Medicine | Admitting: Internal Medicine

## 2016-04-29 VITALS — Wt 157.2 lb

## 2016-04-29 DIAGNOSIS — R600 Localized edema: Secondary | ICD-10-CM | POA: Diagnosis not present

## 2016-04-29 DIAGNOSIS — R0600 Dyspnea, unspecified: Secondary | ICD-10-CM

## 2016-04-29 DIAGNOSIS — J849 Interstitial pulmonary disease, unspecified: Secondary | ICD-10-CM

## 2016-04-29 NOTE — Progress Notes (Signed)
Daily Session Note  Patient Details  Name: [name] MRN: [id number] Date of Birth: 1945-10-31 Referring Provider:     Pulmonary Rehab Walk Test from 04/13/2016 in [place]  Referring Provider  Dr. Chase Caller      Encounter Date: 04/29/2016  Check In:     Session Check In - 04/29/16 1030      Check-In   Location [facility]   Staff Present Trish Fountain, RN, BSN;Abagael Kramm, MS, ACSM RCEP, Exercise Physiologist;Lisa Ysidro Evert, RN;Joan Leonia Reeves, RN, BSN   Supervising physician immediately available to respond to emergencies [facility] immediately available   Physician(s) Dr. Candiss Norse   Medication changes reported     No   Fall or balance concerns reported    No   Tobacco Cessation No Change   Warm-up and Cool-down Performed as group-led instruction   Resistance Training Performed Yes   VAD Patient? No     Pain Assessment   Currently in Pain? No/denies   Multiple Pain Sites No      Capillary Blood Glucose: No results found for this or any previous visit (from the past 24 hour(s)).      Exercise Prescription Changes - 04/29/16 1200      Response to Exercise   Blood Pressure (Admit) 90/52   Blood Pressure (Exercise) 120/70   Blood Pressure (Exit) 94/58   Heart Rate (Admit) 88 bpm   Heart Rate (Exercise) 89 bpm   Heart Rate (Exit) 84 bpm   Oxygen Saturation (Admit) 95 %   Oxygen Saturation (Exercise) 89 %   Oxygen Saturation (Exit) 94 %   Rating of Perceived Exertion (Exercise) 13   Perceived Dyspnea (Exercise) 2   Duration Progress to 45 minutes of aerobic exercise without signs/symptoms of physical distress   Intensity Other (comment)  40-80% of HRR     Progression   Progression Continue to progress workloads to maintain intensity without signs/symptoms of physical distress.     Resistance Training   Training Prescription Yes   Weight orange bands   Reps 10-15   Time 10 Minutes     Arm Ergometer   Level 1   Minutes 17     Track   Laps 12   Minutes 17      History  Smoking Status  . Former Smoker  . Packs/day: 0.30  . Years: 3.00  . Types: Cigarettes  . Start date: 11/08/1964  . Quit date: 11/09/1967  Smokeless Tobacco  . Never Used    Goals Met:  Exercise tolerated well No report of cardiac concerns or symptoms Strength training completed today  Goals Unmet:  Not Applicable  Comments: Service time is from 10:30a to 12:30p    Dr. Rush Farmer is Medical Director for Pulmonary Rehab at [facility].

## 2016-05-02 DIAGNOSIS — J9611 Chronic respiratory failure with hypoxia: Secondary | ICD-10-CM | POA: Diagnosis not present

## 2016-05-02 DIAGNOSIS — R531 Weakness: Secondary | ICD-10-CM | POA: Diagnosis not present

## 2016-05-02 DIAGNOSIS — J8410 Pulmonary fibrosis, unspecified: Secondary | ICD-10-CM | POA: Diagnosis not present

## 2016-05-02 DIAGNOSIS — R0602 Shortness of breath: Secondary | ICD-10-CM | POA: Diagnosis not present

## 2016-05-03 DIAGNOSIS — F324 Major depressive disorder, single episode, in partial remission: Secondary | ICD-10-CM | POA: Diagnosis not present

## 2016-05-04 ENCOUNTER — Encounter (HOSPITAL_COMMUNITY)
Admission: RE | Admit: 2016-05-04 | Discharge: 2016-05-04 | Disposition: A | Discharge status: Home / Self Care | Payer: PPO | Source: Ambulatory Visit | Attending: Internal Medicine | Admitting: Internal Medicine

## 2016-05-04 VITALS — Wt 160.3 lb

## 2016-05-04 DIAGNOSIS — R600 Localized edema: Secondary | ICD-10-CM | POA: Diagnosis not present

## 2016-05-04 DIAGNOSIS — R0600 Dyspnea, unspecified: Secondary | ICD-10-CM

## 2016-05-04 DIAGNOSIS — J849 Interstitial pulmonary disease, unspecified: Secondary | ICD-10-CM

## 2016-05-04 NOTE — Progress Notes (Signed)
Daily Session Note  Patient Details  Name: [name] MRN: [id number] Date of Birth: 04/15/1945 Referring Provider:     Pulmonary Rehab Walk Test from 04/13/2016 in [facility]  Referring Provider  Dr. Ramaswamy      Encounter Date: 05/04/2016  Check In:     Session Check In - 05/04/16 1024      Check-In   Location [facility]   Staff Present Portia Payne, RN, BSN;Molly diVincenzo, MS, ACSM RCEP, Exercise Physiologist; , RN;Joan Behrens, RN, BSN   Supervising physician immediately available to respond to emergencies [facility] immediately available   Physician(s) Dr. Singh   Medication changes reported     No   Fall or balance concerns reported    No   Tobacco Cessation No Change   Warm-up and Cool-down Performed as group-led instruction   Resistance Training Performed Yes   VAD Patient? No     Pain Assessment   Currently in Pain? No/denies   Multiple Pain Sites No      Capillary Blood Glucose: No results found for this or any previous visit (from the past 24 hour(s)).      Exercise Prescription Changes - 05/04/16 1200      Response to Exercise   Blood Pressure (Admit) 110/66   Blood Pressure (Exercise) 106/62   Blood Pressure (Exit) 104/60   Heart Rate (Admit) 84 bpm   Heart Rate (Exercise) 94 bpm   Heart Rate (Exit) 79 bpm   Oxygen Saturation (Admit) 94 %   Oxygen Saturation (Exercise) 95 %   Oxygen Saturation (Exit) 97 %   Rating of Perceived Exertion (Exercise) 13   Perceived Dyspnea (Exercise) 1   Duration Progress to 45 minutes of aerobic exercise without signs/symptoms of physical distress   Intensity THRR unchanged     Progression   Progression Continue to progress workloads to maintain intensity without signs/symptoms of physical distress.     Resistance Training   Training Prescription Yes   Weight orange bands   Reps 10-15   Time 10 Minutes     NuStep   Level 3   Minutes 17    METs 2.1     Arm Ergometer   Level 2   Minutes 17     Track   Laps 15   Minutes 17      History  Smoking Status  . Former Smoker  . Packs/day: 0.30  . Years: 3.00  . Types: Cigarettes  . Start date: 11/08/1964  . Quit date: 11/09/1967  Smokeless Tobacco  . Never Used    Goals Met:  Exercise tolerated well No report of cardiac concerns or symptoms Strength training completed today  Goals Unmet:  Not Applicable  Comments: Service time is from 1030 to 1200    Dr. Wesam G. Yacoub is Medical Director for Pulmonary Rehab at [facility]. 

## 2016-05-06 ENCOUNTER — Encounter (HOSPITAL_COMMUNITY)
Admission: RE | Admit: 2016-05-06 | Discharge: 2016-05-06 | Disposition: A | Discharge status: Home / Self Care | Payer: PPO | Source: Ambulatory Visit | Attending: Internal Medicine | Admitting: Internal Medicine

## 2016-05-06 VITALS — Wt 157.8 lb

## 2016-05-06 DIAGNOSIS — J849 Interstitial pulmonary disease, unspecified: Secondary | ICD-10-CM

## 2016-05-06 DIAGNOSIS — R600 Localized edema: Secondary | ICD-10-CM | POA: Diagnosis not present

## 2016-05-06 DIAGNOSIS — R0600 Dyspnea, unspecified: Secondary | ICD-10-CM

## 2016-05-06 NOTE — Progress Notes (Signed)
Daily Session Note  Patient Details  Name: [name] MRN: [id number] Date of Birth: 12-24-1945 Referring Provider:     Pulmonary Rehab Walk Test from 04/13/2016 in [place]  Referring Provider  Dr. Chase Caller      Encounter Date: 05/06/2016  Check In:     Session Check In - 05/06/16 1103      Check-In   Location [facility]   Staff Present Trish Fountain, RN, Maxcine Ham, RN, BSN;Emmamae Mcnamara, RN;Molly diVincenzo, MS, ACSM RCEP, Exercise Physiologist   Supervising physician immediately available to respond to emergencies [facility] immediately available   Physician(s) Dr. Posey Pronto   Medication changes reported     No   Fall or balance concerns reported    No   Tobacco Cessation No Change   Warm-up and Cool-down Performed as group-led instruction   Resistance Training Performed Yes   VAD Patient? No     Pain Assessment   Currently in Pain? No/denies   Multiple Pain Sites No      Capillary Blood Glucose: No results found for this or any previous visit (from the past 24 hour(s)).      Exercise Prescription Changes - 05/06/16 1200      Response to Exercise   Blood Pressure (Admit) 100/60   Blood Pressure (Exercise) 130/70   Blood Pressure (Exit) 104/60   Heart Rate (Admit) 76 bpm   Heart Rate (Exercise) 107 bpm   Heart Rate (Exit) 82 bpm   Oxygen Saturation (Admit) 95 %   Oxygen Saturation (Exercise) 94 %   Oxygen Saturation (Exit) 96 %   Rating of Perceived Exertion (Exercise) 13   Perceived Dyspnea (Exercise) 2   Duration Progress to 45 minutes of aerobic exercise without signs/symptoms of physical distress   Intensity THRR unchanged     Progression   Progression Continue to progress workloads to maintain intensity without signs/symptoms of physical distress.     Resistance Training   Training Prescription Yes   Weight orange bands   Reps 10-15   Time 10 Minutes     NuStep   Level 3   Minutes  17   METs 2     Track   Laps 14   Minutes 17      History  Smoking Status  . Former Smoker  . Packs/day: 0.30  . Years: 3.00  . Types: Cigarettes  . Start date: 11/08/1964  . Quit date: 11/09/1967  Smokeless Tobacco  . Never Used    Goals Met:  Exercise tolerated well No report of cardiac concerns or symptoms Strength training completed today  Goals Unmet:  Not Applicable  Comments: Service time is from 1030 to 1225. Pt attended Doctor Day today.    Dr. Rush Farmer is Medical Director for Pulmonary Rehab at [facility].

## 2016-05-10 ENCOUNTER — Other Ambulatory Visit: Payer: Self-pay | Admitting: *Deleted

## 2016-05-10 MED ORDER — AMLODIPINE BESYLATE 5 MG PO TABS: 5.0000 mg | ORAL_TABLET | Freq: Every day | ORAL | 11 refills | Status: DC

## 2016-05-10 NOTE — Telephone Encounter (Signed)
Author: Dionicio Stall, RN Author Type: Registered Nurse Filed: 03/24/2016 2:54 PM  Note Status: Signed Cosign: Cosign Not Required Encounter Date: 03/24/2016  Editor: Dionicio Stall, RN (Registered Nurse)    Medication Instructions:  Your physician has recommended you make the following change in your medication:  1) Decrease Amlodipine to 5 mg daily     Patient called and requested that an rx for the 5 mg tab be sent to the pharmacy as the 10 mg tabs are difficult to half.

## 2016-05-11 ENCOUNTER — Encounter (HOSPITAL_COMMUNITY): Payer: PPO

## 2016-05-11 ENCOUNTER — Ambulatory Visit (INDEPENDENT_AMBULATORY_CARE_PROVIDER_SITE_OTHER): Payer: PPO | Admitting: Internal Medicine

## 2016-05-11 ENCOUNTER — Encounter: Payer: Self-pay | Admitting: Internal Medicine

## 2016-05-11 VITALS — BP 106/70 | HR 77 | Ht 59.0 in | Wt 156.6 lb

## 2016-05-11 DIAGNOSIS — R0689 Other abnormalities of breathing: Secondary | ICD-10-CM

## 2016-05-11 DIAGNOSIS — R0600 Dyspnea, unspecified: Secondary | ICD-10-CM

## 2016-05-11 NOTE — Progress Notes (Signed)
Subjective:     Patient ID: [name], female   DOB: [birth year], 71 y.o.   MRN: [id number]  HPI    OV 12/31/2015  Chief Complaint  Patient presents with  . Follow-up    Pt states her breathing is unchanged since last OV. Pt states infrequent dry cough and wheezing. Pt denies CP/tightness, f/c/s.     OV 02/17/2015  Chief Complaint  Patient presents with  . Follow-up    Pt here after lung biospy. Pt c/o right lateral chest pain from the surgery, c/o chest congestion with non prod cough. Pt states her SOB is unchanged since last OV.    71 year old obese woman referred for pulm fibrosis. Hx from patient (poor hx) and chart and husband.  Office visit 11/22/2014: Dyspnea - insidious onset x several years. Progressive steadily x few years. Severe. Brought on with ltd exertion like ADLs. Relieved by rest. No associated cough or wheeze. ECHO 10/15/14 - gr1 diast dysfn and per husband recently started on lasix without improvement. Had CTA 11/9/176 - PE ruled out but per report CT shows  ILD.  Non specific scar changes on most recent CT and new RUL nodule in 2015 and 2016.   PFT 11/18/14 shows restriction with low DLCO - suggestive of ILD - FVC 70%, TLC 69%, DLCO 52% (BMI 29.67 kg/(m^2)).   Walking desat test 185 feet x 3 laps - stopped due to dyspnea prematurely at 2 laps. Also fatigue - did not desat at 2 laps.  02/17/15: Underwent right VATS, mini thoracotomy for open lung biopsy 01/24/2015 with Dr. Prescott Gum. Path with focal mild nonspecific chronic bronchiolitis and pleuritis with granulomatous features. Dyspnea improved since surgery but not back to baseline. Cough with chest congestion but unable to bring up. Pain improved from surgery. No fevers or chills. Occasional chronic chest pain. Some wheezing. She uses 2L O2 at night.   OV 05/20/2015  Chief Complaint  Patient presents with  . Follow-up    Pt states she feels her breathing has worsened since last OV. Pt c/o slight increase in SOB  with activity, mild dry cough and has occassional chest discomfort - this resolves with rest.     Follow-up dyspnea in the setting of restrictive PFTs and ILD pattern and CT chest. Surgical lung biopsy without specific ILD diagnosis  After last visit I referred her to pulmonary habitation which she says has helped her dyspnea. She did see [facility] Dr. Lauris Chroman in April 2017 and apparently has been reassured. I reviewed his notes and he did want to review her biopsy of the multidisciplinary thoracic ILD clinic. I do not have the conclusion of this but it appears from the patient and her husband who both are poor historians that they were reassured. They were even told that she might have some emphysema. In February 2017 she had admission for nonspecific weakness. At discharge she was given some albuterol which apparently helped. She is wanting to try the same  There are no other new issues.    OV 08/25/2015  Chief Complaint  Patient presents with  . Follow-up    Pt states that she does not feel she is improving. Pt has been exercising 3 days a week. Pt c/o an increase in SOB and wheeze with exertion. Pt denies cough/cp/tightness.     Follow-up dyspnea in the setting of restrictive PFTs and nocturnal desaturation with 2016 CT suggestive of ILD surgical lung biopsy without specific diagnosis.  She returns with  her husband. In April 2017 basal ILD clinic at [facility] and reassured. Since then she is attended pulmonary rehabilitation he did she tells me this does not help. She is currently working out a [employer] and this is not helping her. She still dyspneic with exertion relieved by rest. She did try Brio inhaler that I gave her and this did not help her either. There are no other new symptoms. Review of the chart shows she has chronic anemia hemoglobin 9 g percent at least since January 2017. A year ago she was a little bit higher than that. Most recently apparently  primary care physician checked her blood hemoglobin and it was low at 9 g percent. She does not know the cause. She never seen hematology. She was blood transfusions a few years ago. Let us high-resolution CT chest August 2017 read by thoracic radiologist suggested is no ILD anymore.  ONO  - positive for desats on RA - and started on 1 L [place] after this visit  OV 12/31/2015  Chief Complaint  Patient presents with  . Follow-up    Pt states her breathing is unchanged since last OV. Pt states infrequent dry cough and wheezing. Pt denies CP/tightness, f/c/s.      Follow-up dyspnea in the setting of restrictive PFTs and nocturnal desaturation with 2016 CT suggestive of ILD surgical lung biopsy - unclassifabile mil;d ILD without specific diagnosis Jan 2017 and fu CT clear august 2017  At last visit in August 2017 she still had dyspnea with the CT chest showed no interstitial lung disease. Overnight desaturation test then showed mild hypoxemia at night. We started on 1 L oxygen which she says seems to help but she is noncompliant with this. In addition I found her to be anemic with a hemoglobin less than 9 g percent in February 2017. She then followed up with a primary care physician and renal physician who has started on iron therapy. She tells me that August 2017 hemoglobin was 9 g percent. Since then after being on iron tablets hemoglobin check with her primary care physician a few weeks ago is greater than 10 g percent according to her history. I do not have those results with him. She does me despite improvement in hemoglobin her dyspnea persists. She is unable to take a shower without resting because of dyspnea. She is unable to climb a flight of stairs but she can change her clothes. Associated with the dyspnea is exertional wheeze. This no resting wheeze is no nocturnal awakening. This no edema this no chest tightness is no fever or chills. She continues to be obese and physically  deconditioned.  Exhaled nitric oxide done today for the first time 12/31/2015 -> 6 ppff and normal [noted in the past that inhaler therapy did not help her]    in past exercise therapy did not help her.   OV 02/12/2016  Chief Complaint  Patient presents with  . Follow-up    Pt here CPST. Pt denies change in SOB and wheezing since last OV and also c/o midsternal CP, pt had a stress test with cards, per pt this was normal. Pt denies cough.   Follow-up dyspnea  Here for a pulmonary stress test done 01/26/2016 off note she did have a normal nuclear medicine cardiac stress test 02/04/2016  Pulmonary stress test shows good respiratory exchange ratio of 1.129 maximal effort. Heart aerobic capacity is significantly reduced. The reason for this appears to be chronotropic incompetence along with physical deconditioning.  Her Cardizem was changed to amlodipine but this has not helped her dyspnea. She is now telling me that in the past exercise therapy helped and she wants referral to pulmonary rehabilitation. Medication review shows that she is on both metoprolol and amlodipine. Husband says this is because of previous tachycardia. She also tells me that she does wheeze after shower but in the past exhaled nitric oxide was normal  OV 05/11/2016  Chief Complaint  Patient presents with  . Follow-up    Pt currently going to pulmonary rehab. Pt states her breathing is unchanged since last OV. Pt denies cough and f/c/s. Pt states she has occassional CP when active - resolves with rest.    Walked in to see [name] and she got call about her borther in law Althia Forts who was my ICU patient last week. Now in floor. FOundwith hemateemis and had cpr and passed away/ D/w resident Dr Jari Favre. Office visit cancelled   has a past medical history of Anxiety; Arthritis; Carpal tunnel syndrome, bilateral (07/31/2015); Cervical spondylosis without myelopathy; Chronic chest pain; Chronic kidney disease; Chronic  low back pain; Chronotropic incompetence; CKD (chronic kidney disease), stage III; Coronary artery disease; Depression; Esophageal stricture; GERD (gastroesophageal reflux disease); Gross hematuria; History of esophageal dilatation; History of gout; History of hiatal hernia; History of kidney stones; Hyperlipidemia; Hypertension; Hypothyroidism; Internal hemorrhoids; Irritable bowel syndrome; Obesity; Orthostatic hypotension; Partial seizure disorder (HCC) (NEUROLOGIST-- DR Jannifer Franklin); PONV (postoperative nausea and vomiting); Pulmonary fibrosis ([place]); S/P pericardial cyst excision; Seizures ([place]); SVT (supraventricular tachycardia) ([place]); Trigger finger, acquired (09/30/2015); and Vitamin D deficiency.   reports that she quit smoking about 48 years ago. Her smoking use included Cigarettes. She started smoking about 51 years ago. She has a 0.90 pack-year smoking history. She has never used smokeless tobacco.  Past Surgical History:  Procedure Laterality Date  . BIOPSY OF MEDIASTINAL MASS N/A 02/05/2013   Procedure: RESECTION OF MEDIASTINAL MASS;  Surgeon: Ivin Poot, MD;  Location: [place];  Service: Thoracic;  Laterality: N/A;  . CARDIAC CATHETERIZATION Right 01/24/2015   Procedure: right femoral ARTERIAL LINE INSERTION;  Surgeon: Ivin Poot, MD;  Location: [place];  Service: Thoracic;  Laterality: Right;  . CARDIOVASCULAR STRESS TEST  11-22-2013  dr Irish Lack   normal lexiscan study/  no ischemia/  normal LVF and wall motion/ ef 81%  . COLONOSCOPY  last one 2014  . CORONARY ANGIOGRAM  03/10/2011   Procedure: CORONARY ANGIOGRAM;  Surgeon: Jettie Booze, MD;  Location: [facility] CATH LAB;  Service: Cardiovascular;;  . CORONARY ANGIOPLASTY WITH STENT PLACEMENT  11-22-2009  dr Irish Lack   Acute inferoposterior MI/  ef 60%,  PCI with DES x1 to dRCA,  25%  mLAD  . CORONARY ANGIOPLASTY WITH STENT PLACEMENT  06-26-2010  dr Daneen Schick   Cutting balloon angioplasty to dRCA with DES x1,  40% mLAD  (non-obstructive CAD)  . CYSTOSCOPY WITH RETROGRADE PYELOGRAM, URETEROSCOPY AND STENT PLACEMENT Right 02/13/2014   Procedure: CYSTOSCOPY WITH RETROGRADE PYELOGRAM, URETEROSCOPY AND STENT PLACEMENT;  Surgeon: Bernestine Amass, MD;  Location: [place] [facility];  Service: Urology;  Laterality: Right;  . ECTOPIC PREGNANCY SURGERY  YRS AGO   SALPINGECTOMY  . ESOPHAGOGASTRODUODENOSCOPY N/A 02/15/2014   Procedure: ESOPHAGOGASTRODUODENOSCOPY (EGD);  Surgeon: Jerene Bears, MD;  Location: [facility];  Service: Endoscopy;  Laterality: N/A;  . ESOPHAGOGASTRODUODENOSCOPY (EGD) WITH ESOPHAGEAL DILATION  05-20-2011  . HOLMIUM LASER APPLICATION Right 09/19/8336   Procedure: HOLMIUM LASER APPLICATION;  Surgeon: Camelia Eng  Risa Grill, MD;  Location: [facility];  Service: Urology;  Laterality: Right;  . LEFT HEART CATHETERIZATION WITH CORONARY ANGIOGRAM N/A 03/14/2012   Procedure: LEFT HEART CATHETERIZATION WITH CORONARY ANGIOGRAM;  Surgeon: Sueanne Margarita, MD;  Location: [place] CATH LAB;  Service: Cardiovascular;  Laterality: N/A;  Normal LM,  50% pLAD,  70% mLAD,  D2 50-70%, very tortuous LAD,  70-82% ostial LCFX,  50% in-stent restenosis of  dRCA and mRCA  stent and 50-70% ostial PDA,  normal LVSF, ef 60%  . LUNG BIOPSY Right 01/24/2015   Procedure: RIGHT LUNG BIOPSY;  Surgeon: Ivin Poot, MD;  Location: [place];  Service: Thoracic;  Laterality: Right;  . MEDIASTERNOTOMY N/A 02/05/2013   Procedure: MEDIAN STERNOTOMY;  Surgeon: Ivin Poot, MD;  Location: [place];  Service: Thoracic;  Laterality: N/A;  . NEEDLE GUIDED EXCISION BREAST CALCIFICATIONS Right 08-09-2008  . PERCUTANEOUS CORONARY STENT INTERVENTION (PCI-S) N/A 04/03/2012   Procedure: PERCUTANEOUS CORONARY STENT INTERVENTION (PCI-S);  Surgeon: Jettie Booze, MD;  Location: [facility] CATH LAB;  Service: Cardiovascular;  Laterality: N/A;   Successful PCI  mLAD with 2.75x12 Promus stent, postdilated to >0.52mm  . THORACOTOMY Right 01/24/2015    Procedure: RIGHT MINI/LIMITED THORACOTOMY;  Surgeon: Ivin Poot, MD;  Location: [place];  Service: Thoracic;  Laterality: Right;  . TRANSTHORACIC ECHOCARDIOGRAM  11-17-2011   grade I diastolic dysfunction/  ef 55-60%  . VIDEO BRONCHOSCOPY N/A 01/24/2015   Procedure: RIGHT VIDEO BRONCHOSCOPY;  Surgeon: Ivin Poot, MD;  [facility];  Service: Thoracic;  Laterality: N/A;    No Active Allergies  Immunization History  Administered Date(s) Administered  . Influenza, High Dose Seasonal PF 10/12/2015  . Influenza,inj,Quad PF,36+ Mos 09/23/2014  . Influenza-Unspecified 10/11/2012  . Pneumococcal-Unspecified 11/15/2009  . Zoster 02/07/2012    Family History  Problem Relation Age of Onset  . Breast cancer Sister   . Cancer Sister     breast  . Heart disease Father   . Hypertension Father   . Emphysema Father   . Coronary artery disease Father   . Diabetes Mellitus I Father   . Heart attack Mother   . CVA Mother   . Breast cancer      niece  . Colon cancer Neg Hx      Current Outpatient Prescriptions:  .  ALPRAZolam (XANAX) 0.25 MG tablet, Take 0.25 mg by mouth at bedtime., Disp: , Rfl:  .  amLODipine (NORVASC) 5 MG tablet, Take 1 tablet (5 mg total) by mouth daily., Disp: 30 tablet, Rfl: 11 .  aspirin EC 81 MG tablet, Take 81 mg by mouth daily., Disp: , Rfl:  .  calcitRIOL (ROCALTROL) 0.5 MCG capsule, Take 0.5 mcg by mouth daily., Disp: , Rfl:  .  clopidogrel (PLAVIX) 75 MG tablet, TAKE 1 TABLET (75 MG TOTAL) BY MOUTH DAILY WITH BREAKFAST., Disp: 30 tablet, Rfl: 10 .  diltiazem (CARDIZEM CD) 240 MG 24 hr capsule, Take 1 capsule (240 mg total) by mouth daily., Disp: 90 capsule, Rfl: 3 .  doxazosin (CARDURA) 2 MG tablet, TAKE 1 TABLET (2 MG TOTAL) BY MOUTH DAILY., Disp: 30 tablet, Rfl: 8 .  DULoxetine (CYMBALTA) 20 MG capsule, Take 20 mg by mouth daily., Disp: , Rfl:  .  ferrous sulfate 325 (65 FE) MG tablet, Take 325 mg by mouth., Disp: , Rfl:  .  furosemide (LASIX) 40 MG  tablet, Take 1 tablet (40 mg total) by mouth every other day., Disp: 15 tablet, Rfl:  11 .  HYDROcodone-acetaminophen (NORCO) 10-325 MG tablet, Take 1 tablet by mouth every 6 (six) hours as needed., Disp: , Rfl:  .  isosorbide mononitrate (IMDUR) 60 MG 24 hr tablet, TAKE 1 AND 1/2 TABLETS BY MOUTH EVERY DAY, Disp: 45 tablet, Rfl: 5 .  levETIRAcetam (KEPPRA) 500 MG tablet, Take 1 tablet (500 mg total) by mouth 2 (two) times daily., Disp: 60 tablet, Rfl: 11 .  levothyroxine (SYNTHROID, LEVOTHROID) 112 MCG tablet, Take 112 mcg by mouth daily., Disp: , Rfl: 5 .  losartan (COZAAR) 100 MG tablet, Take 100 mg by mouth every morning. , Disp: , Rfl:  .  magnesium oxide (MAG-OX) 400 MG tablet, Take 1 tablet (400 mg total) by mouth 2 (two) times daily., Disp: 60 tablet, Rfl: 11 .  nebivolol (BYSTOLIC) 5 MG tablet, Take 1 tablet (5 mg total) by mouth daily., Disp: 90 tablet, Rfl: 3 .  NITROSTAT 0.4 MG SL tablet, PLACE 1 TABLET (0.4 MG TOTAL) UNDER THE TONGUE EVERY 5 (FIVE) MINUTES AS NEEDED FOR CHEST PAIN., Disp: 25 tablet, Rfl: 3 .  pantoprazole (PROTONIX) 40 MG tablet, Take 1 tablet (40 mg total) by mouth 2 (two) times daily., Disp: 60 tablet, Rfl: 11 .  potassium chloride SA (K-DUR,KLOR-CON) 20 MEQ tablet, Take 40 mEq by mouth every other day., Disp: , Rfl:  .  rosuvastatin (CRESTOR) 20 MG tablet, TAKE 1 TABLET (20 MG TOTAL) BY MOUTH EVERY MORNING., Disp: 90 tablet, Rfl: 3 .  Mometasone Furoate (ASMANEX HFA) 100 MCG/ACT AERO, Inhale 2 puffs into the lungs 2 (two) times daily. (Patient not taking: Reported on 05/11/2016), Disp: 1 Inhaler, Rfl: 0    Review of Systems     Objective:   Physical Exam Vitals:   05/11/16 1025  BP: 106/70  Pulse: 77  SpO2: 95%  Weight: 156 lb 9.6 oz (71 kg)  Height: 4\' 11"  (1.499 m)    Estimated body mass index is 31.63 kg/m as calculated from the following:   Height as of this encounter: 4\' 11"  (1.499 m).   Weight as of this encounter: 156 lb 9.6 oz (71 kg).      Assessment:     dyspnea    Plan:     [place] office visit due to death in family

## 2016-05-11 NOTE — Patient Instructions (Signed)
cance ov due to sudden death in family middle of interview

## 2016-05-13 ENCOUNTER — Encounter (HOSPITAL_COMMUNITY): Payer: PPO

## 2016-05-18 ENCOUNTER — Encounter (HOSPITAL_COMMUNITY)
Admission: RE | Admit: 2016-05-18 | Discharge: 2016-05-18 | Disposition: A | Discharge status: Home / Self Care | Payer: PPO | Source: Ambulatory Visit | Attending: Internal Medicine | Admitting: Internal Medicine

## 2016-05-18 VITALS — Wt 157.4 lb

## 2016-05-18 DIAGNOSIS — R600 Localized edema: Secondary | ICD-10-CM | POA: Insufficient documentation

## 2016-05-18 DIAGNOSIS — R0600 Dyspnea, unspecified: Secondary | ICD-10-CM

## 2016-05-18 DIAGNOSIS — J849 Interstitial pulmonary disease, unspecified: Secondary | ICD-10-CM

## 2016-05-18 DIAGNOSIS — R062 Wheezing: Secondary | ICD-10-CM | POA: Diagnosis not present

## 2016-05-18 DIAGNOSIS — R0689 Other abnormalities of breathing: Secondary | ICD-10-CM | POA: Diagnosis not present

## 2016-05-18 DIAGNOSIS — R5381 Other malaise: Secondary | ICD-10-CM | POA: Diagnosis not present

## 2016-05-18 NOTE — Progress Notes (Signed)
Pulmonary Individual Treatment Plan  Patient Details  Name: [name] MRN: [id number] Date of Birth: January 09, 1946 Referring Provider:     Pulmonary Rehab Walk Test from 04/13/2016 in [place]  Referring Provider  Dr. Chase Caller      Initial Encounter Date:    Pulmonary Rehab Walk Test from 04/13/2016 in [place]  Date  04/13/16  Referring Provider  Dr. Chase Caller      Visit Diagnosis: Dyspnea, unspecified type  ILD (interstitial lung disease) ([place])  Patient's Home Medications on Admission:   Current Outpatient Prescriptions:  .  ALPRAZolam (XANAX) 0.25 MG tablet, Take 0.25 mg by mouth at bedtime., Disp: , Rfl:  .  amLODipine (NORVASC) 5 MG tablet, Take 1 tablet (5 mg total) by mouth daily., Disp: 30 tablet, Rfl: 11 .  aspirin EC 81 MG tablet, Take 81 mg by mouth daily., Disp: , Rfl:  .  calcitRIOL (ROCALTROL) 0.5 MCG capsule, Take 0.5 mcg by mouth daily., Disp: , Rfl:  .  clopidogrel (PLAVIX) 75 MG tablet, TAKE 1 TABLET (75 MG TOTAL) BY MOUTH DAILY WITH BREAKFAST., Disp: 30 tablet, Rfl: 10 .  diltiazem (CARDIZEM CD) 240 MG 24 hr capsule, Take 1 capsule (240 mg total) by mouth daily., Disp: 90 capsule, Rfl: 3 .  doxazosin (CARDURA) 2 MG tablet, TAKE 1 TABLET (2 MG TOTAL) BY MOUTH DAILY., Disp: 30 tablet, Rfl: 8 .  DULoxetine (CYMBALTA) 20 MG capsule, Take 20 mg by mouth daily., Disp: , Rfl:  .  ferrous sulfate 325 (65 FE) MG tablet, Take 325 mg by mouth., Disp: , Rfl:  .  furosemide (LASIX) 40 MG tablet, Take 1 tablet (40 mg total) by mouth every other day., Disp: 15 tablet, Rfl: 11 .  HYDROcodone-acetaminophen (NORCO) 10-325 MG tablet, Take 1 tablet by mouth every 6 (six) hours as needed., Disp: , Rfl:  .  isosorbide mononitrate (IMDUR) 60 MG 24 hr tablet, TAKE 1 AND 1/2 TABLETS BY MOUTH EVERY DAY, Disp: 45 tablet, Rfl: 5 .  levETIRAcetam (KEPPRA) 500 MG tablet, Take 1 tablet (500 mg total) by mouth 2 (two) times  daily., Disp: 60 tablet, Rfl: 11 .  levothyroxine (SYNTHROID, LEVOTHROID) 112 MCG tablet, Take 112 mcg by mouth daily., Disp: , Rfl: 5 .  losartan (COZAAR) 100 MG tablet, Take 100 mg by mouth every morning. , Disp: , Rfl:  .  magnesium oxide (MAG-OX) 400 MG tablet, Take 1 tablet (400 mg total) by mouth 2 (two) times daily., Disp: 60 tablet, Rfl: 11 .  Mometasone Furoate (ASMANEX HFA) 100 MCG/ACT AERO, Inhale 2 puffs into the lungs 2 (two) times daily. (Patient not taking: Reported on 05/11/2016), Disp: 1 Inhaler, Rfl: 0 .  nebivolol (BYSTOLIC) 5 MG tablet, Take 1 tablet (5 mg total) by mouth daily., Disp: 90 tablet, Rfl: 3 .  NITROSTAT 0.4 MG SL tablet, PLACE 1 TABLET (0.4 MG TOTAL) UNDER THE TONGUE EVERY 5 (FIVE) MINUTES AS NEEDED FOR CHEST PAIN., Disp: 25 tablet, Rfl: 3 .  pantoprazole (PROTONIX) 40 MG tablet, Take 1 tablet (40 mg total) by mouth 2 (two) times daily., Disp: 60 tablet, Rfl: 11 .  potassium chloride SA (K-DUR,KLOR-CON) 20 MEQ tablet, Take 40 mEq by mouth every other day., Disp: , Rfl:  .  rosuvastatin (CRESTOR) 20 MG tablet, TAKE 1 TABLET (20 MG TOTAL) BY MOUTH EVERY MORNING., Disp: 90 tablet, Rfl: 3  Past Medical History: Past Medical History:  Diagnosis Date  . Anxiety   .  Arthritis   . Carpal tunnel syndrome, bilateral 07/31/2015  . Cervical spondylosis without myelopathy   . Chronic chest pain   . Chronic kidney disease   . Chronic low back pain   . Chronotropic incompetence   . CKD (chronic kidney disease), stage III   . Coronary artery disease    a. inf-post MI 2011 s/p DES to RCA. b. DES to RCA 06/2010. c. DES to LAD 2014, residual dz treated medically.  . Depression   . Esophageal stricture   . GERD (gastroesophageal reflux disease)   . Gross hematuria   . History of esophageal dilatation    FOR STRIUCTURE  . History of gout   . History of hiatal hernia   . History of kidney stones   . Hyperlipidemia   . Hypertension   . Hypothyroidism   . Internal  hemorrhoids   . Irritable bowel syndrome   . Obesity   . Orthostatic hypotension   . Partial seizure disorder ([facility]) NEUROLOGIST-- DR WILLIS   NOCTURNAL  . PONV (postoperative nausea and vomiting)    hard time getting iv site-had to do neck stick 2 yrs ago  . Pulmonary fibrosis ([place])   . S/P pericardial cyst excision    02-05-2013  benign  . Seizures ([place])    none in yrs  . SVT (supraventricular tachycardia) ([place])   . Trigger finger, acquired 09/30/2015   Right middle finger  . Vitamin D deficiency     Tobacco Use: History  Smoking Status  . Former Smoker  . Packs/day: 0.30  . Years: 3.00  . Types: Cigarettes  . Start date: 11/08/1964  . Quit date: 11/09/1967  Smokeless Tobacco  . Never Used    Labs: Recent Review Flowsheet Data    Labs for ITP Cardiac and Pulmonary Rehab Latest Ref Rng & Units 01/24/2015 01/24/2015 01/25/2015 06/06/2015 02/04/2016   Cholestrol 100 - 199 mg/dL - - - 147 162   LDLCALC 0 - 99 mg/dL - - - 54 70   HDL >39 mg/dL - - - 82 78   Trlycerides 0 - 149 mg/dL - - - 55 68   Hemoglobin A1c 4.8 - 5.6 % - - - - -   PHART 7.350 - 7.450 7.358 7.276(L) 7.362 - -   PCO2ART 35.0 - 45.0 mmHg 45.2(H) 56.7(H) 45.6(H) - -   HCO3 20.0 - 24.0 mEq/L 25.7(H) 26.6(H) 25.9(H) - -   TCO2 0 - 100 mmol/L _0 - -   ACIDBASEDEF 0.0 - 2.0 mmol/L - 1.0 - - -   O2SAT % 100.0 93.0 99.0 - -      Capillary Blood Glucose: Lab Results  Component Value Date   GLUCAP 102 (H) 03/03/2015   GLUCAP 79 03/02/2015   GLUCAP 107 (H) 03/01/2015   GLUCAP 110 (H) 01/26/2015   GLUCAP 119 (H) 01/26/2015     ADL UCSD:     Pulmonary Assessment Scores    Row Name 04/08/16 1503         ADL UCSD   ADL Phase Entry     SOB Score total 58        Pulmonary Function Assessment:     Pulmonary Function Assessment - 04/05/16 1027      Breath   Bilateral Breath Sounds Clear   Shortness of Breath Yes;Limiting activity;Panic with Shortness of Breath      Exercise Target  Goals:    Exercise Program Goal: Individual exercise prescription set with THRR, safety & activity  barriers. Participant demonstrates ability to understand and report RPE using BORG scale, to self-measure pulse accurately, and to acknowledge the importance of the exercise prescription.  Exercise Prescription Goal: Starting with aerobic activity 30 plus minutes a day, 3 days per week for initial exercise prescription. Provide home exercise prescription and guidelines that participant acknowledges understanding prior to discharge.  Activity Barriers & Risk Stratification:   6 Minute Walk:     6 Minute Walk    Row Name 04/13/16 1633         6 Minute Walk   Phase Initial     Distance 1250 feet     Walk Time 6 minutes     # of Rest Breaks 0     MPH 2.36     METS 2.84     RPE 13     Perceived Dyspnea  1     Symptoms Yes (comment)     Comments shortness of breath and fatigue     Resting HR 90 bpm     Resting BP 129/84     Max Ex. HR 110 bpm     Max Ex. BP 147/84     2 Minute Post BP 147/84       Interval HR   Baseline HR 90     1 Minute HR 100     2 Minute HR 105     3 Minute HR 110     4 Minute HR 110     5 Minute HR 109     6 Minute HR 109     2 Minute Post HR 104     Interval Heart Rate? Yes       Interval Oxygen   Interval Oxygen? Yes     Baseline Oxygen Saturation % 95 %     Baseline Liters of Oxygen 0 L     1 Minute Oxygen Saturation % 97 %     1 Minute Liters of Oxygen 0 L     2 Minute Oxygen Saturation % 96 %     2 Minute Liters of Oxygen 0 L     3 Minute Oxygen Saturation % 97 %     3 Minute Liters of Oxygen 0 L     4 Minute Oxygen Saturation % 96 %     4 Minute Liters of Oxygen 0 L     5 Minute Oxygen Saturation % 96 %     5 Minute Liters of Oxygen 0 L     6 Minute Oxygen Saturation % 96 %     6 Minute Liters of Oxygen 0 L     2 Minute Post Oxygen Saturation % 97 %     2 Minute Post Liters of Oxygen 0 L        Oxygen Initial Assessment:      Oxygen Initial Assessment - 04/13/16 1647      Home Oxygen   Home Oxygen Device Home Concentrator   Sleep Oxygen Prescription Continuous   Liters per minute 1   Home Exercise Oxygen Prescription None   Home at Rest Exercise Oxygen Prescription None   Compliance with Home Oxygen Use Yes     Initial 6 min Walk   Oxygen Used None   Resting Oxygen Saturation  during 6 min walk 95 %   Exercise Oxygen Saturation  during 6 min walk 95 %     Program Oxygen Prescription   Program Oxygen Prescription None  Oxygen Re-Evaluation:     Oxygen Re-Evaluation    Row Name 05/18/16 1422             Program Oxygen Prescription   Program Oxygen Prescription None         Home Oxygen   Home Oxygen Device Home Concentrator       Sleep Oxygen Prescription Continuous       Liters per minute 1       Home Exercise Oxygen Prescription None       Home at Rest Exercise Oxygen Prescription None       Compliance with Home Oxygen Use Yes         Goals/Expected Outcomes   Short Term Goals To learn and exhibit compliance with exercise, home and travel O2 prescription;To learn and understand importance of monitoring SPO2 with pulse oximeter and demonstrate accurate use of the pulse oximeter.;To Learn and understand importance of maintaining oxygen saturations>88%;To learn and demonstrate proper purse lipped breathing techniques or other breathing techniques.;To learn and demonstrate proper use of respiratory medications       Long  Term Goals Exhibits compliance with exercise, home and travel O2 prescription;Verbalizes importance of monitoring SPO2 with pulse oximeter and return demonstration;Maintenance of O2 saturations>88%;Exhibits proper breathing techniques, such as purse lipped breathing or other method taught during program session;Compliance with respiratory medication          Oxygen Discharge (Final Oxygen Re-Evaluation):     Oxygen Re-Evaluation - 05/18/16 1422      Program Oxygen  Prescription   Program Oxygen Prescription None     Home Oxygen   Home Oxygen Device Home Concentrator   Sleep Oxygen Prescription Continuous   Liters per minute 1   Home Exercise Oxygen Prescription None   Home at Rest Exercise Oxygen Prescription None   Compliance with Home Oxygen Use Yes     Goals/Expected Outcomes   Short Term Goals To learn and exhibit compliance with exercise, home and travel O2 prescription;To learn and understand importance of monitoring SPO2 with pulse oximeter and demonstrate accurate use of the pulse oximeter.;To Learn and understand importance of maintaining oxygen saturations>88%;To learn and demonstrate proper purse lipped breathing techniques or other breathing techniques.;To learn and demonstrate proper use of respiratory medications   Long  Term Goals Exhibits compliance with exercise, home and travel O2 prescription;Verbalizes importance of monitoring SPO2 with pulse oximeter and return demonstration;Maintenance of O2 saturations>88%;Exhibits proper breathing techniques, such as purse lipped breathing or other method taught during program session;Compliance with respiratory medication      Initial Exercise Prescription:     Initial Exercise Prescription - 04/13/16 1600      Date of Initial Exercise RX and Referring Provider   Date 04/13/16   Referring Provider Dr. Chase Caller     NuStep   Level 1   Minutes 17   METs 1.5     Arm Ergometer   Level 1   Minutes 17     Track   Laps 6   Minutes 17     Prescription Details   Frequency (times per week) 2   Duration Progress to 45 minutes of aerobic exercise without signs/symptoms of physical distress     Intensity   THRR 40-80% of Max Heartrate 60-121   Ratings of Perceived Exertion 11-13   Perceived Dyspnea 0-4     Progression   Progression Continue progressive overload as per policy without signs/symptoms or physical distress.     Horticulturist, commercial  Prescription Yes    Weight orange bands   Reps 10-15      Perform Capillary Blood Glucose checks as needed.  Exercise Prescription Changes:     Exercise Prescription Changes    Row Name 04/27/16 1200 04/29/16 1200 05/04/16 1200 05/06/16 1200 05/18/16 1200     Response to Exercise   Blood Pressure (Admit) 104/60 90/52 110/66 100/60 104/60   Blood Pressure (Exercise) 110/60 120/70 106/62 130/70 130/80   Blood Pressure (Exit) 110/72 94/58 104/60 104/60 108/60   Heart Rate (Admit) 94 bpm 88 bpm 84 bpm 76 bpm 90 bpm   Heart Rate (Exercise) 106 bpm 89 bpm 94 bpm 107 bpm 95 bpm   Heart Rate (Exit) 83 bpm 84 bpm 79 bpm 82 bpm 81 bpm   Oxygen Saturation (Admit) 97 % 95 % 94 % 95 % 92 %   Oxygen Saturation (Exercise) 91 % 89 % 95 % 94 % 95 %   Oxygen Saturation (Exit) 92 % 94 % 97 % 96 % 94 %   Rating of Perceived Exertion (Exercise) _0 Perceived Dyspnea (Exercise) _1 Duration Progress to 45 minutes of aerobic exercise without signs/symptoms of physical distress Progress to 45 minutes of aerobic exercise without signs/symptoms of physical distress Progress to 45 minutes of aerobic exercise without signs/symptoms of physical distress Progress to 45 minutes of aerobic exercise without signs/symptoms of physical distress Progress to 45 minutes of aerobic exercise without signs/symptoms of physical distress   Intensity Other (comment)  40-80% of HRR Other (comment)  40-80% of HRR THRR unchanged THRR unchanged THRR unchanged     Progression   Progression Continue to progress workloads to maintain intensity without signs/symptoms of physical distress. Continue to progress workloads to maintain intensity without signs/symptoms of physical distress. Continue to progress workloads to maintain intensity without signs/symptoms of physical distress. Continue to progress workloads to maintain intensity without signs/symptoms of physical distress. Continue to progress workloads to maintain intensity  without signs/symptoms of physical distress.     Resistance Training   Training Prescription _2    Weight _3    Reps 10-15 10-15 10-15 10-15 10-15   Time 10 Minutes 10 Minutes 10 Minutes 10 Minutes 10 Minutes     NuStep   Level 3  - _4 Minutes 17  - _5 METs 2.3  - 2.1 2 2.2     Arm Ergometer   Level _6 -  -   Minutes _7 -  -     Track   Laps _8 Minutes _9 Exercise Comments:   Exercise Goals and Review:   Exercise Goals Re-Evaluation :     Exercise Goals Re-Evaluation    Row Name 05/17/16 0725             Exercise Goal Re-Evaluation   Exercise Goals Review Increase Physical Activity;Increase Strenth and Stamina       Comments Patient has only attended four exercise sessions. Will cont. to monitor and progress as appropriate.        Expected Outcomes Through exercise at rehab and home patient will increase physical capacity, stength, and stamina.          Discharge Exercise Prescription (Final Exercise Prescription Changes):  Exercise Prescription Changes - 05/18/16 1200      Response to Exercise   Blood Pressure (Admit) 104/60   Blood Pressure (Exercise) 130/80   Blood Pressure (Exit) 108/60   Heart Rate (Admit) 90 bpm   Heart Rate (Exercise) 95 bpm   Heart Rate (Exit) 81 bpm   Oxygen Saturation (Admit) 92 %   Oxygen Saturation (Exercise) 95 %   Oxygen Saturation (Exit) 94 %   Rating of Perceived Exertion (Exercise) 13   Perceived Dyspnea (Exercise) 2   Duration Progress to 45 minutes of aerobic exercise without signs/symptoms of physical distress   Intensity THRR unchanged     Progression   Progression Continue to progress workloads to maintain intensity without signs/symptoms of physical distress.     Resistance Training   Training Prescription Yes   Weight orange bands   Reps 10-15   Time 10 Minutes      NuStep   Level 3   Minutes 17   METs 2.2     Track   Laps 10   Minutes 17      Nutrition:  Target Goals: Understanding of nutrition guidelines, daily intake of sodium <1542m, cholesterol <208m calories 30% from fat and 7% or less from saturated fats, daily to have 5 or more servings of fruits and vegetables.  Biometrics:     Pre Biometrics - 04/05/16 1031      Pre Biometrics   Grip Strength 20 kg       Nutrition Therapy Plan and Nutrition Goals:     Nutrition Therapy & Goals - 04/29/16 1025      Nutrition Therapy   Diet Therapeutic Lifestyle Changes     Personal Nutrition Goals   Nutrition Goal Wt loss of 1-2 lb/week to a wt loss goal of 6-12 lb at graduation from rehab.      Intervention Plan   Intervention Prescribe, educate and counsel regarding individualized specific dietary modifications aiming towards targeted core components such as weight, hypertension, lipid management, diabetes, heart failure and other comorbidities.   Expected Outcomes Short Term Goal: Understand basic principles of dietary content, such as calories, fat, sodium, cholesterol and nutrients.;Long Term Goal: Adherence to prescribed nutrition plan.      Nutrition Discharge: Rate Your Plate Scores:     Nutrition Assessments - 04/29/16 1026      Rate Your Plate Scores   Pre Score 49      Nutrition Goals Re-Evaluation:   Nutrition Goals Discharge (Final Nutrition Goals Re-Evaluation):   Psychosocial: Target Goals: Acknowledge presence or absence of significant depression and/or stress, maximize coping skills, provide positive support system. Participant is able to verbalize types and ability to use techniques and skills needed for reducing stress and depression.  Initial Review & Psychosocial Screening:   Quality of Life Scores:     Quality of Life - 04/08/16 1504      Quality of Life Scores   Health/Function Pre 16.37 %   Socioeconomic Pre 21.29 %   Psych/Spiritual Pre  18.86 %   Family Pre 22 %   GLOBAL Pre 18.52 %      PHQ-9: Recent Review Flowsheet Data    [facility] 04/05[facility]2016 07/21/2015 05/05/2015 04/02/2015 03/17/2015   Decreased Interest 3 0 0 0  1   Down, Depressed, Hopeless 3 0 0 0 1   PHQ - 2 Score 6 0 0 0 2   Altered sleeping 3 - 0 1 3   Tired, decreased energy 3 -  0 1 3   Change in appetite 3 - 0 0 0   Feeling bad or failure about yourself  0 - 0 0 1   Trouble concentrating 0 - 0 0 0   Moving slowly or fidgety/restless 3 - 0 0 0   Suicidal thoughts 0 - 0 0 0   PHQ-9 Score 18 - 0 2 9   Difficult doing work/chores Somewhat difficult - Not difficult at all Not difficult at all Not difficult at all     Interpretation of Total Score  Total Score Depression Severity:  1-4 = Minimal depression, 5-9 = Mild depression, 10-14 = Moderate depression, 15-19 = Moderately severe depression, 20-27 = Severe depression   Psychosocial Evaluation and Intervention:   Psychosocial Re-Evaluation:     Psychosocial Re-Evaluation    Row Name 05/18/16 1424             Psychosocial Re-Evaluation   Current issues with Current Stress Concerns       Comments recent loss of dog and brother in law       Interventions Encouraged to attend Pulmonary Rehabilitation for the exercise       Continue Psychosocial Services  Follow up required by staff       Comments see comment section on ITP         Initial Review   Source of Stress Concerns Poor Coping Skills;Family;Unable to participate in former interests or hobbies          Psychosocial Discharge (Final Psychosocial Re-Evaluation):     Psychosocial Re-Evaluation - 05/18/16 1424      Psychosocial Re-Evaluation   Current issues with Current Stress Concerns   Comments recent loss of dog and brother in law   Interventions Encouraged to attend Pulmonary Rehabilitation for the exercise   Continue Psychosocial Services  Follow up required by staff   Comments see comment section on ITP      Initial Review   Source of Stress Concerns Poor Coping Skills;Family;Unable to participate in former interests or hobbies      Education: Education Goals: Education classes will be provided on a weekly basis, covering required topics. Participant will state understanding/return demonstration of topics presented.  Learning Barriers/Preferences:     Learning Barriers/Preferences - 04/05/16 1026      Learning Barriers/Preferences   Learning Barriers None   Learning Preferences Group Instruction;Individual Instruction;Pictoral;Skilled Demonstration;Verbal Instruction;Written Material;Video      Education Topics: Risk Factor Reduction:  -Group instruction that is supported by a PowerPoint presentation. Instructor discusses the definition of a risk factor, different risk factors for pulmonary disease, and how the heart and lungs work together.     Nutrition for Pulmonary Patient:  -Group instruction provided by PowerPoint slides, verbal discussion, and written materials to support subject matter. The instructor gives an explanation and review of healthy diet recommendations, which includes a discussion on weight management, recommendations for fruit and vegetable consumption, as well as protein, fluid, caffeine, fiber, sodium, sugar, and alcohol. Tips for eating when patients are short of breath are discussed.   PULMONARY REHAB OTHER RESPIRATORY from 06/19/2015 in [place]  Date  06/12/15  Educator  RD  Instruction Review Code  2- meets goals/outcomes      Pursed Lip Breathing:  -Group instruction that is supported by demonstration and informational handouts. Instructor discusses the benefits of pursed lip and diaphragmatic breathing and detailed demonstration on how to preform both.     PULMONARY REHAB OTHER RESPIRATORY  from 06/19/2015 in [place]  Date  06/05/15  Educator  EP  Instruction Review Code  2- meets  goals/outcomes      Oxygen Safety:  -Group instruction provided by PowerPoint, verbal discussion, and written material to support subject matter. There is an overview of "What is Oxygen" and "Why do we need it".  Instructor also reviews how to create a safe environment for oxygen use, the importance of using oxygen as prescribed, and the risks of noncompliance. There is a brief discussion on traveling with oxygen and resources the patient may utilize.   PULMONARY REHAB OTHER RESPIRATORY from 06/19/2015 in [place]  Date  04/16/15  Educator  RN  Instruction Review Code  2- meets goals/outcomes      Oxygen Equipment:  -Group instruction provided by [facility] Staff utilizing handouts, written materials, and equipment demonstrations.   PULMONARY REHAB OTHER RESPIRATORY from 06/19/2015 in [place]  Date  05/01/15  Educator  [employer] rep  Instruction Review Code  2- meets goals/outcomes      Signs and Symptoms:  -Group instruction provided by written material and verbal discussion to support subject matter. Warning signs and symptoms of infection, stroke, and heart attack are reviewed and when to call the physician/911 reinforced. Tips for preventing the spread of infection discussed.   Advanced Directives:  -Group instruction provided by verbal instruction and written material to support subject matter. Instructor reviews Advanced Directive laws and proper instruction for filling out document.   Pulmonary Video:  -Group video education that reviews the importance of medication and oxygen compliance, exercise, good nutrition, pulmonary hygiene, and pursed lip and diaphragmatic breathing for the pulmonary patient.   PULMONARY REHAB OTHER RESPIRATORY from 06/19/2015 in [place]  Date  06/19/15  Instruction Review Code  2- meets goals/outcomes      Exercise for the Pulmonary Patient:  -Group  instruction that is supported by a PowerPoint presentation. Instructor discusses benefits of exercise, core components of exercise, frequency, duration, and intensity of an exercise routine, importance of utilizing pulse oximetry during exercise, safety while exercising, and options of places to exercise outside of rehab.     PULMONARY REHAB OTHER RESPIRATORY from 04/29/2016 in [place]  Date  04/29/16  Educator  EP  Instruction Review Code  R- Review/reinforce      Pulmonary Medications:  -Verbally interactive group education provided by instructor with focus on inhaled medications and proper administration.   PULMONARY REHAB OTHER RESPIRATORY from 06/19/2015 in [place]  Date  04/10/15  Educator  Pharm D      Anatomy and Physiology of the Respiratory System and Intimacy:  -Group instruction provided by PowerPoint, verbal discussion, and written material to support subject matter. Instructor reviews respiratory cycle and anatomical components of the respiratory system and their functions. Instructor also reviews differences in obstructive and restrictive respiratory diseases with examples of each. Intimacy, Sex, and Sexuality differences are reviewed with a discussion on how relationships can change when diagnosed with pulmonary disease. Common sexual concerns are reviewed.   PULMONARY REHAB OTHER RESPIRATORY from 06/19/2015 in [place]  Date  05/22/15  Educator  Trish Fountain  Instruction Review Code  2- meets goals/outcomes      Knowledge Questionnaire Score:     Knowledge Questionnaire Score - 04/08/16 1503      Knowledge Questionnaire  Score   Pre Score 12/13      Core Components/Risk Factors/Patient Goals at Admission:     Personal Goals and Risk Factors at Admission - 04/05/16 1031      Core Components/Risk Factors/Patient Goals on Admission   Improve shortness of breath with  ADL's Yes   Intervention Provide education, individualized exercise plan and daily activity instruction to help decrease symptoms of SOB with activities of daily living.   Expected Outcomes Short Term: Achieves a reduction of symptoms when performing activities of daily living.   Develop more efficient breathing techniques such as purse lipped breathing and diaphragmatic breathing; and practicing self-pacing with activity Yes   Increase knowledge of respiratory medications and ability to use respiratory devices properly  Yes      Core Components/Risk Factors/Patient Goals Review:      Goals and Risk Factor Review    Row Name 05/18/16 1424             Core Components/Risk Factors/Patient Goals Review   Personal Goals Review Weight Management/Obesity;Improve shortness of breath with ADL's;Increase knowledge of respiratory medications and ability to use respiratory devices properly.;Develop more efficient breathing techniques such as purse lipped breathing and diaphragmatic breathing and practicing self-pacing with activity.       Review see comment section in ITP       Expected Outcomes see admission expected outcomes          Core Components/Risk Factors/Patient Goals at Discharge (Final Review):      Goals and Risk Factor Review - 05/18/16 1424      Core Components/Risk Factors/Patient Goals Review   Personal Goals Review Weight Management/Obesity;Improve shortness of breath with ADL's;Increase knowledge of respiratory medications and ability to use respiratory devices properly.;Develop more efficient breathing techniques such as purse lipped breathing and diaphragmatic breathing and practicing self-pacing with activity.   Review see comment section in ITP   Expected Outcomes see admission expected outcomes      ITP Comments:   Comments: ITP REVIEW Pt has only attended 5 sessions since her admission to pulmonary rehab. She has completed the program a year ago, but returned  for severe deconditioning. She had an adjustment in her cardiac medications that had an adverse affect on her heart failure. She feels an emotional loss since her dog died and she lost her brother in law last week. She attempted to attend pulmonary rehab today but left early related to being preoccupied in her thoughts. She tolerates workload increases and works hard when she is here. She is learning to pace herself. Recommend continued exercise, life style modification, education, and utilization of breathing techniques to increase stamina and strength and decrease shortness of breath with exertion.

## 2016-05-18 NOTE — Progress Notes (Signed)
Daily Session Note  Patient Details  Name: [name] MRN: [id number] Date of Birth: 1945/05/29 Referring Provider:     Pulmonary Rehab Walk Test from 04/13/2016 in [place]  Referring Provider  Dr. Chase Caller      Encounter Date: 05/18/2016  Check In:     Session Check In - 05/18/16 1030      Check-In   Location [facility]   Staff Present Rosebud Poles, RN, BSN;Molly diVincenzo, MS, ACSM RCEP, Exercise Physiologist;Lisa Ysidro Evert, RN;Portia Rollene Rotunda, RN, BSN   Supervising physician immediately available to respond to emergencies [facility] immediately available   Physician(s) Dr. Sloan Leiter   Medication changes reported     No   Fall or balance concerns reported    No   Tobacco Cessation No Change   Warm-up and Cool-down Performed as group-led instruction   Resistance Training Performed Yes   VAD Patient? No     Pain Assessment   Currently in Pain? No/denies   Multiple Pain Sites No      Capillary Blood Glucose: No results found for this or any previous visit (from the past 24 hour(s)).      Exercise Prescription Changes - 05/18/16 1200      Response to Exercise   Blood Pressure (Admit) 104/60   Blood Pressure (Exercise) 130/80   Blood Pressure (Exit) 108/60   Heart Rate (Admit) 90 bpm   Heart Rate (Exercise) 95 bpm   Heart Rate (Exit) 81 bpm   Oxygen Saturation (Admit) 92 %   Oxygen Saturation (Exercise) 95 %   Oxygen Saturation (Exit) 94 %   Rating of Perceived Exertion (Exercise) 13   Perceived Dyspnea (Exercise) 2   Duration Progress to 45 minutes of aerobic exercise without signs/symptoms of physical distress   Intensity THRR unchanged     Progression   Progression Continue to progress workloads to maintain intensity without signs/symptoms of physical distress.     Resistance Training   Training Prescription Yes   Weight orange bands   Reps 10-15   Time 10 Minutes     NuStep   Level 3   Minutes  17   METs 2.2     Track   Laps 10   Minutes 17      History  Smoking Status  . Former Smoker  . Packs/day: 0.30  . Years: 3.00  . Types: Cigarettes  . Start date: 11/08/1964  . Quit date: 11/09/1967  Smokeless Tobacco  . Never Used    Goals Met:  Exercise tolerated well Strength training completed today  Goals Unmet:  Not Applicable  Comments: Service time is from 1030 to 1150    Dr. Rush Farmer is Medical Director for Pulmonary Rehab at [facility].

## 2016-05-19 ENCOUNTER — Encounter: Payer: Self-pay | Admitting: Internal Medicine

## 2016-05-19 ENCOUNTER — Ambulatory Visit (INDEPENDENT_AMBULATORY_CARE_PROVIDER_SITE_OTHER): Payer: PPO | Admitting: Internal Medicine

## 2016-05-19 VITALS — BP 122/80 | HR 77 | Ht 59.0 in | Wt 156.0 lb

## 2016-05-19 DIAGNOSIS — R0600 Dyspnea, unspecified: Secondary | ICD-10-CM

## 2016-05-19 DIAGNOSIS — R0689 Other abnormalities of breathing: Secondary | ICD-10-CM | POA: Diagnosis not present

## 2016-05-19 NOTE — Patient Instructions (Signed)
ICD-9-CM ICD-10-CM   1. Dyspnea and respiratory abnormality 786.09 R06.00     R06.89     No relieft with rehab Stopping cardiac meds made your heart rate worse AT this point, we are resigned to continued shortness of breath You can start exercising by self at [employer] -exercise always help  Return In 1 year or sooner if needed especially if you get worse

## 2016-05-19 NOTE — Progress Notes (Signed)
Subjective:     Patient ID: [name], female   DOB: 09/30/1945, 71 y.o.   MRN: [id number]  HPI   OV 12/31/2015  Chief Complaint  Patient presents with  . Follow-up    Pt states her breathing is unchanged since last OV. Pt states infrequent dry cough and wheezing. Pt denies CP/tightness, f/c/s.     OV 02/17/2015  Chief Complaint  Patient presents with  . Follow-up    Pt here after lung biospy. Pt c/o right lateral chest pain from the surgery, c/o chest congestion with non prod cough. Pt states her SOB is unchanged since last OV.    71 year old obese woman referred for pulm fibrosis. Hx from patient (poor hx) and chart and husband.  Office visit 11/22/2014: Dyspnea - insidious onset x several years. Progressive steadily x few years. Severe. Brought on with ltd exertion like ADLs. Relieved by rest. No associated cough or wheeze. ECHO 10/15/14 - gr1 diast dysfn and per husband recently started on lasix without improvement. Had CTA 11/9/176 - PE ruled out but per report CT shows  ILD.  Non specific scar changes on most recent CT and new RUL nodule in 2015 and 2016.   PFT 11/18/14 shows restriction with low DLCO - suggestive of ILD - FVC 70%, TLC 69%, DLCO 52% (BMI 29.67 kg/(m^2)).   Walking desat test 185 feet x 3 laps - stopped due to dyspnea prematurely at 2 laps. Also fatigue - did not desat at 2 laps.  02/17/15: Underwent right VATS, mini thoracotomy for open lung biopsy 01/24/2015 with Dr. Prescott Gum. Path with focal mild nonspecific chronic bronchiolitis and pleuritis with granulomatous features. Dyspnea improved since surgery but not back to baseline. Cough with chest congestion but unable to bring up. Pain improved from surgery. No fevers or chills. Occasional chronic chest pain. Some wheezing. She uses 2L O2 at night.   OV 05/20/2015  Chief Complaint  Patient presents with  . Follow-up    Pt states she feels her breathing has worsened since last OV. Pt c/o slight increase in SOB  with activity, mild dry cough and has occassional chest discomfort - this resolves with rest.     Follow-up dyspnea in the setting of restrictive PFTs and ILD pattern and CT chest. Surgical lung biopsy without specific ILD diagnosis  After last visit I referred her to pulmonary habitation which she says has helped her dyspnea. She did see [facility] Dr. Lauris Chroman in April 2017 and apparently has been reassured. I reviewed his notes and he did want to review her biopsy of the multidisciplinary thoracic ILD clinic. I do not have the conclusion of this but it appears from the patient and her husband who both are poor historians that they were reassured. They were even told that she might have some emphysema. In February 2017 she had admission for nonspecific weakness. At discharge she was given some albuterol which apparently helped. She is wanting to try the same  There are no other new issues.    OV 08/25/2015  Chief Complaint  Patient presents with  . Follow-up    Pt states that she does not feel she is improving. Pt has been exercising 3 days a week. Pt c/o an increase in SOB and wheeze with exertion. Pt denies cough/cp/tightness.     Follow-up dyspnea in the setting of restrictive PFTs and nocturnal desaturation with 2016 CT suggestive of ILD surgical lung biopsy without specific diagnosis.  She returns with her  husband. In April 2017 basal ILD clinic at [facility] and reassured. Since then she is attended pulmonary rehabilitation he did she tells me this does not help. She is currently working out a [employer] and this is not helping her. She still dyspneic with exertion relieved by rest. She did try Brio inhaler that I gave her and this did not help her either. There are no other new symptoms. Review of the chart shows she has chronic anemia hemoglobin 9 g percent at least since January 2017. A year ago she was a little bit higher than that. Most recently apparently  primary care physician checked her blood hemoglobin and it was low at 9 g percent. She does not know the cause. She never seen hematology. She was blood transfusions a few years ago. Let us high-resolution CT chest August 2017 read by thoracic radiologist suggested is no ILD anymore.  ONO  - positive for desats on RA - and started on 1 L [place] after this visit  OV 12/31/2015  Chief Complaint  Patient presents with  . Follow-up    Pt states her breathing is unchanged since last OV. Pt states infrequent dry cough and wheezing. Pt denies CP/tightness, f/c/s.      Follow-up dyspnea in the setting of restrictive PFTs and nocturnal desaturation with 2016 CT suggestive of ILD surgical lung biopsy - unclassifabile mil;d ILD without specific diagnosis Jan 2017 and fu CT clear august 2017  At last visit in August 2017 she still had dyspnea with the CT chest showed no interstitial lung disease. Overnight desaturation test then showed mild hypoxemia at night. We started on 1 L oxygen which she says seems to help but she is noncompliant with this. In addition I found her to be anemic with a hemoglobin less than 9 g percent in February 2017. She then followed up with a primary care physician and renal physician who has started on iron therapy. She tells me that August 2017 hemoglobin was 9 g percent. Since then after being on iron tablets hemoglobin check with her primary care physician a few weeks ago is greater than 10 g percent according to her history. I do not have those results with him. She does me despite improvement in hemoglobin her dyspnea persists. She is unable to take a shower without resting because of dyspnea. She is unable to climb a flight of stairs but she can change her clothes. Associated with the dyspnea is exertional wheeze. This no resting wheeze is no nocturnal awakening. This no edema this no chest tightness is no fever or chills. She continues to be obese and physically  deconditioned.  Exhaled nitric oxide done today for the first time 12/31/2015 -> 6 ppff and normal [noted in the past that inhaler therapy did not help her]    in past exercise therapy did not help her.   OV 02/12/2016  Chief Complaint  Patient presents with  . Follow-up    Pt here CPST. Pt denies change in SOB and wheezing since last OV and also c/o midsternal CP, pt had a stress test with cards, per pt this was normal. Pt denies cough.   Follow-up dyspnea  Here for a pulmonary stress test done 01/26/2016 off note she did have a normal nuclear medicine cardiac stress test 02/04/2016  Pulmonary stress test shows good respiratory exchange ratio of 1.129 maximal effort. Heart aerobic capacity is significantly reduced. The reason for this appears to be chronotropic incompetence along with physical deconditioning. Her  Cardizem was changed to amlodipine but this has not helped her dyspnea. She is now telling me that in the past exercise therapy helped and she wants referral to pulmonary rehabilitation. Medication review shows that she is on both metoprolol and amlodipine. Husband says this is because of previous tachycardia. She also tells me that she does wheeze after shower but in the past exhaled nitric oxide was normal  OV 05/11/2016  Chief Complaint  Patient presents with  . Follow-up    Pt currently going to pulmonary rehab. Pt states her breathing is unchanged since last OV. Pt denies cough and f/c/s. Pt states she has occassional CP when active - resolves with rest.    Walked in to see [name] and she got call about her borther in law Althia Forts who was my ICU patient last week. Now in floor. FOundwith hemateemis and had cpr and passed away/ D/w resident Dr Jari Favre. Office visit   OV 05/19/2016  Chief Complaint  Patient presents with  . Follow-up    Pt here after pt had to leave suddenly during 05/11/2016 OV d/t a loss of family memeber. Pt is now here to cotinue the visit from  05/11/2016.     Follow-up dyspnea due to chronotropic incompetence and obesity  Last visit I referred her to pulmonary rehabilitation which she is attending but she tells me this does not help. I coordinated with cardiology and had to stop one of her cardiac medications but this made him more tachycardic. She is therefore back on beta blocker. Med review shows she is on bisoprolol Ancef Toprol. At this point in time she is resigned to having dyspnea it is stable. She prefers to get out of pulmonary rehabilitation work out on her own at fitness. There are no new issues.    has a past medical history of Anxiety; Arthritis; Carpal tunnel syndrome, bilateral (07/31/2015); Cervical spondylosis without myelopathy; Chronic chest pain; Chronic kidney disease; Chronic low back pain; Chronotropic incompetence; CKD (chronic kidney disease), stage III; Coronary artery disease; Depression; Esophageal stricture; GERD (gastroesophageal reflux disease); Gross hematuria; History of esophageal dilatation; History of gout; History of hiatal hernia; History of kidney stones; Hyperlipidemia; Hypertension; Hypothyroidism; Internal hemorrhoids; Irritable bowel syndrome; Obesity; Orthostatic hypotension; Partial seizure disorder (HCC) (NEUROLOGIST-- DR Jannifer Franklin); PONV (postoperative nausea and vomiting); Pulmonary fibrosis ([place]); S/P pericardial cyst excision; Seizures ([place]); SVT (supraventricular tachycardia) ([place]); Trigger finger, acquired (09/30/2015); and Vitamin D deficiency.   reports that she quit smoking about 48 years ago. Her smoking use included Cigarettes. She started smoking about 51 years ago. She has a 0.90 pack-year smoking history. She has never used smokeless tobacco.  Past Surgical History:  Procedure Laterality Date  . BIOPSY OF MEDIASTINAL MASS N/A 02/05/2013   Procedure: RESECTION OF MEDIASTINAL MASS;  Surgeon: Ivin Poot, MD;  Location: [facility];  Service: Thoracic;  Laterality: N/A;  . CARDIAC  CATHETERIZATION Right 01/24/2015   Procedure: right femoral ARTERIAL LINE INSERTION;  Surgeon: Ivin Poot, MD;  Location: [facility];  Service: Thoracic;  Laterality: Right;  . CARDIOVASCULAR STRESS TEST  11-22-2013  dr Irish Lack   normal lexiscan study/  no ischemia/  normal LVF and wall motion/ ef 81%  . COLONOSCOPY  last one 2014  . CORONARY ANGIOGRAM  03/10/2011   Procedure: CORONARY ANGIOGRAM;  Surgeon: Jettie Booze, MD;  Location: [facility] CATH LAB;  Service: Cardiovascular;;  . CORONARY ANGIOPLASTY WITH STENT PLACEMENT  11-22-2009  dr Irish Lack   Acute inferoposterior MI/  ef 60%,  PCI with DES x1 to dRCA,  25%  mLAD  . CORONARY ANGIOPLASTY WITH STENT PLACEMENT  06-26-2010  dr Daneen Schick   Cutting balloon angioplasty to dRCA with DES x1,  40% mLAD (non-obstructive CAD)  . CYSTOSCOPY WITH RETROGRADE PYELOGRAM, URETEROSCOPY AND STENT PLACEMENT Right 02/13/2014   Procedure: CYSTOSCOPY WITH RETROGRADE PYELOGRAM, URETEROSCOPY AND STENT PLACEMENT;  Surgeon: Bernestine Amass, MD;  Location: [facility];  Service: Urology;  Laterality: Right;  . ECTOPIC PREGNANCY SURGERY  YRS AGO   SALPINGECTOMY  . ESOPHAGOGASTRODUODENOSCOPY N/A 02/15/2014   Procedure: ESOPHAGOGASTRODUODENOSCOPY (EGD);  Surgeon: Jerene Bears, MD;  Location: [facility];  Service: Endoscopy;  Laterality: N/A;  . ESOPHAGOGASTRODUODENOSCOPY (EGD) WITH ESOPHAGEAL DILATION  05-20-2011  . HOLMIUM LASER APPLICATION Right 0/05/3974   Procedure: HOLMIUM LASER APPLICATION;  Surgeon: Bernestine Amass, MD;  Location: [facility];  Service: Urology;  Laterality: Right;  . LEFT HEART CATHETERIZATION WITH CORONARY ANGIOGRAM N/A 03/14/2012   Procedure: LEFT HEART CATHETERIZATION WITH CORONARY ANGIOGRAM;  Surgeon: Sueanne Margarita, MD;  Location: [place] CATH LAB;  Service: Cardiovascular;  Laterality: N/A;  Normal LM,  50% pLAD,  70% mLAD,  D2 50-70%, very tortuous LAD,  70-82% ostial LCFX,  50% in-stent restenosis of  dRCA and mRCA   stent and 50-70% ostial PDA,  normal LVSF, ef 60%  . LUNG BIOPSY Right 01/24/2015   Procedure: RIGHT LUNG BIOPSY;  Surgeon: Ivin Poot, MD;  Location: [place];  Service: Thoracic;  Laterality: Right;  . MEDIASTERNOTOMY N/A 02/05/2013   Procedure: MEDIAN STERNOTOMY;  Surgeon: Ivin Poot, MD;  Location: [facility];  Service: Thoracic;  Laterality: N/A;  . NEEDLE GUIDED EXCISION BREAST CALCIFICATIONS Right 08-09-2008  . PERCUTANEOUS CORONARY STENT INTERVENTION (PCI-S) N/A 04/03/2012   Procedure: PERCUTANEOUS CORONARY STENT INTERVENTION (PCI-S);  Surgeon: Jettie Booze, MD;  Location[facility] C[facility] LAB;  Service: Cardiovascular;  Laterality: N/A;   Successful PCI  mLAD with 2.75x12 Promus stent, postdilated to >0.61mm  . THORACOTOMY Right 01/24/2015   Procedure: RIGHT MINI/LIMITED THORACOTOMY;  Surgeon: Ivin Poot, MD;  Location: [place];  Service: Thoracic;  Laterality: Right;  . TRANSTHORACIC ECHOCARDIOGRAM  11-17-2011   grade I diastolic dysfunction/  ef 55-60%  . VIDEO BRONCHOSCOPY N/A 01/24/2015   Procedure: RIGHT VIDEO BRONCHOSCOPY;  Surgeon: Ivin Poot, MD;  Location: [facility] OR;  Service: Thoracic;  Laterality: N/A;    No Active Allergies  Immunization History  Administered Date(s) Administered  . Influenza, High Dose Seasonal PF 10/12/2015  . Influenza,inj,Quad PF,36+ Mos 09/23/2014  . Influenza-Unspecified 10/11/2012  . Pneumococcal-Unspecified 11/15/2009  . Zoster 02/07/2012    Family History  Problem Relation Age of Onset  . Breast cancer Sister   . Cancer Sister     breast  . Heart disease Father   . Hypertension Father   . Emphysema Father   . Coronary artery disease Father   . Diabetes Mellitus I Father   . Heart attack Mother   . CVA Mother   . Breast cancer      niece  . Colon cancer Neg Hx      Current Outpatient Prescriptions:  .  ALPRAZolam (XANAX) 0.25 MG tablet, Take 0.25 mg by mouth at bedtime., Disp: , Rfl:  .  amLODipine (NORVASC) 5 MG tablet, Take  1 tablet (5 mg total) by mouth daily., Disp: 30 tablet, Rfl: 11 .  aspirin EC 81 MG tablet, Take 81 mg by mouth daily., Disp: ,  Rfl:  .  calcitRIOL (ROCALTROL) 0.5 MCG capsule, Take 0.5 mcg by mouth daily., Disp: , Rfl:  .  clopidogrel (PLAVIX) 75 MG tablet, TAKE 1 TABLET (75 MG TOTAL) BY MOUTH DAILY WITH BREAKFAST., Disp: 30 tablet, Rfl: 10 .  diltiazem (CARDIZEM CD) 240 MG 24 hr capsule, Take 1 capsule (240 mg total) by mouth daily., Disp: 90 capsule, Rfl: 3 .  doxazosin (CARDURA) 2 MG tablet, TAKE 1 TABLET (2 MG TOTAL) BY MOUTH DAILY., Disp: 30 tablet, Rfl: 8 .  DULoxetine (CYMBALTA) 20 MG capsule, Take 20 mg by mouth daily., Disp: , Rfl:  .  ferrous sulfate 325 (65 FE) MG tablet, Take 325 mg by mouth., Disp: , Rfl:  .  furosemide (LASIX) 40 MG tablet, Take 1 tablet (40 mg total) by mouth every other day., Disp: 15 tablet, Rfl: 11 .  HYDROcodone-acetaminophen (NORCO) 10-325 MG tablet, Take 1 tablet by mouth every 6 (six) hours as needed., Disp: , Rfl:  .  isosorbide mononitrate (IMDUR) 60 MG 24 hr tablet, TAKE 1 AND 1/2 TABLETS BY MOUTH EVERY DAY, Disp: 45 tablet, Rfl: 5 .  levETIRAcetam (KEPPRA) 500 MG tablet, Take 1 tablet (500 mg total) by mouth 2 (two) times daily., Disp: 60 tablet, Rfl: 11 .  levothyroxine (SYNTHROID, LEVOTHROID) 112 MCG tablet, Take 112 mcg by mouth daily., Disp: , Rfl: 5 .  losartan (COZAAR) 100 MG tablet, Take 100 mg by mouth every morning. , Disp: , Rfl:  .  magnesium oxide (MAG-OX) 400 MG tablet, Take 1 tablet (400 mg total) by mouth 2 (two) times daily., Disp: 60 tablet, Rfl: 11 .  Mometasone Furoate (ASMANEX HFA) 100 MCG/ACT AERO, Inhale 2 puffs into the lungs 2 (two) times daily., Disp: 1 Inhaler, Rfl: 0 .  nebivolol (BYSTOLIC) 5 MG tablet, Take 1 tablet (5 mg total) by mouth daily., Disp: 90 tablet, Rfl: 3 .  NITROSTAT 0.4 MG SL tablet, PLACE 1 TABLET (0.4 MG TOTAL) UNDER THE TONGUE EVERY 5 (FIVE) MINUTES AS NEEDED FOR CHEST PAIN., Disp: 25 tablet, Rfl: 3 .   pantoprazole (PROTONIX) 40 MG tablet, Take 1 tablet (40 mg total) by mouth 2 (two) times daily., Disp: 60 tablet, Rfl: 11 .  potassium chloride SA (K-DUR,KLOR-CON) 20 MEQ tablet, Take 40 mEq by mouth every other day., Disp: , Rfl:  .  rosuvastatin (CRESTOR) 20 MG tablet, TAKE 1 TABLET (20 MG TOTAL) BY MOUTH EVERY MORNING., Disp: 90 tablet, Rfl: 3   Review of Systems     Objective:   Physical Exam Vitals:   05/19/16 1129  BP: 122/80  Pulse: 77  SpO2: 97%  Weight: 156 lb (70.8 kg)  Height: 4\' 11"  (1.499 m)    Discussion visit. Brief exam shows clear lung fields. Obese female looks deconditioned. Soft ejection systolic heart murmur present.    Assessment:       ICD-9-CM ICD-10-CM   1. Dyspnea and respiratory abnormality 786.09 R06.00     R06.89        Plan:      No relieft with rehab Stopping cardiac meds made your heart rate worse AT this point, we are resigned to continued shortness of breath You can start exercising by self at [employer] -exercise always help  Return In 1 year or sooner if needed especially if you get worse  (> 50% of this 15 min visit spent in face to face counseling or/and coordination of care)   Dr. Brand Males, M.D., [facility].C.P Pulmonary and Critical Care Medicine Staff  Physician [facility] Pager: [phone], If no answer or between  15:00h - 7:00h: call [phone]  05/19/2016 11:58 AM

## 2016-05-20 ENCOUNTER — Encounter (HOSPITAL_COMMUNITY): Payer: PPO

## 2016-05-20 ENCOUNTER — Telehealth: Payer: Self-pay | Admitting: Internal Medicine

## 2016-05-20 DIAGNOSIS — R0600 Dyspnea, unspecified: Secondary | ICD-10-CM

## 2016-05-20 DIAGNOSIS — J9611 Chronic respiratory failure with hypoxia: Secondary | ICD-10-CM

## 2016-05-20 DIAGNOSIS — R0689 Other abnormalities of breathing: Principal | ICD-10-CM

## 2016-05-20 NOTE — Telephone Encounter (Signed)
Spoke with the pt's spouse  He states that MR discussed stopping CPAP and o2 at yesterday's visit  He states [facility] needing order to d/c both  I do not see this on the AVS  Please advise, thanks!

## 2016-05-21 NOTE — Telephone Encounter (Signed)
Called and spoke with pt and she is aware of order that has been placed to [facility] to have them pick up her equipment.  Nothing further is needed.

## 2016-05-21 NOTE — Telephone Encounter (Signed)
Yes that is true. Plesae dc both. She did not find it beneficial  Dr. Brand Males, M.D., [facility].C.P Pulmonary and Critical Care Medicine Staff Physician [facility] Pager: [phone], If no answer or between  15:00h - 7:00h: call [phone]  05/21/2016 10:23 AM

## 2016-05-25 ENCOUNTER — Encounter (HOSPITAL_COMMUNITY): Payer: PPO

## 2016-05-27 ENCOUNTER — Encounter (HOSPITAL_COMMUNITY): Payer: PPO

## 2016-05-27 DIAGNOSIS — N183 Chronic kidney disease, stage 3 (moderate): Secondary | ICD-10-CM | POA: Diagnosis not present

## 2016-06-01 ENCOUNTER — Other Ambulatory Visit: Payer: Self-pay | Admitting: Cardiology

## 2016-06-01 ENCOUNTER — Telehealth: Payer: Self-pay | Admitting: Internal Medicine

## 2016-06-01 ENCOUNTER — Encounter (HOSPITAL_COMMUNITY): Payer: PPO

## 2016-06-01 DIAGNOSIS — E889 Metabolic disorder, unspecified: Secondary | ICD-10-CM | POA: Diagnosis not present

## 2016-06-01 DIAGNOSIS — I129 Hypertensive chronic kidney disease with stage 1 through stage 4 chronic kidney disease, or unspecified chronic kidney disease: Secondary | ICD-10-CM | POA: Diagnosis not present

## 2016-06-01 DIAGNOSIS — E559 Vitamin D deficiency, unspecified: Secondary | ICD-10-CM | POA: Diagnosis not present

## 2016-06-01 DIAGNOSIS — D631 Anemia in chronic kidney disease: Secondary | ICD-10-CM | POA: Diagnosis not present

## 2016-06-01 DIAGNOSIS — N183 Chronic kidney disease, stage 3 (moderate): Secondary | ICD-10-CM | POA: Diagnosis not present

## 2016-06-01 DIAGNOSIS — M9080 Osteopathy in diseases classified elsewhere, unspecified site: Secondary | ICD-10-CM | POA: Diagnosis not present

## 2016-06-01 DIAGNOSIS — N200 Calculus of kidney: Secondary | ICD-10-CM | POA: Diagnosis not present

## 2016-06-01 NOTE — Telephone Encounter (Signed)
Spoke with Pt and her husband, [name] (on [place]) who is concerned about her being on several BP medications. She was seen today in Nephrology by Dr. Olivia Mackie at [facility], [facility] with [facility].who stated in his note that patient BP is borderline low. [name] said Dr. Olivia Mackie was concerned about her Amlodipine and patient's husband was wondering if it would be okay with Dr. [name] if she could come off of the Amlodipine. Pt checked BP with home machine after appointment and BP was 113/86, HR 69. Advised patient and her husband that I would forward to Dr. [name] for further review and recommendation and someone would get back to pt with his recommendations. [name] thanked me for my time and call. Advised patient to call back with any other concerns.

## 2016-06-01 NOTE — Telephone Encounter (Signed)
New message    Fritz Pickerel is calling to ask about pt medication. He states that they saw another doctor today and they are concerned because the doctor today is concerned about it her meds.

## 2016-06-03 ENCOUNTER — Encounter (HOSPITAL_COMMUNITY): Payer: PPO

## 2016-06-08 ENCOUNTER — Other Ambulatory Visit: Payer: Self-pay | Admitting: Cardiology

## 2016-06-08 ENCOUNTER — Encounter (HOSPITAL_COMMUNITY): Payer: PPO

## 2016-06-09 NOTE — Telephone Encounter (Signed)
Patient calling states that the pharmacy has not received an authorization for the isorsobide medication. Thanks.

## 2016-06-10 ENCOUNTER — Encounter (HOSPITAL_COMMUNITY): Payer: PPO

## 2016-06-14 ENCOUNTER — Other Ambulatory Visit: Payer: Self-pay | Admitting: Cardiology

## 2016-06-14 ENCOUNTER — Other Ambulatory Visit: Payer: Self-pay | Admitting: *Deleted

## 2016-06-14 DIAGNOSIS — E200 Idiopathic hypoparathyroidism: Secondary | ICD-10-CM | POA: Diagnosis not present

## 2016-06-14 MED ORDER — LEVETIRACETAM 500 MG PO TABS: 500.0000 mg | ORAL_TABLET | Freq: Two times a day (BID) | ORAL | 4 refills | Status: DC

## 2016-06-15 ENCOUNTER — Encounter (HOSPITAL_COMMUNITY): Payer: PPO

## 2016-06-17 ENCOUNTER — Encounter (HOSPITAL_COMMUNITY): Payer: PPO

## 2016-06-21 ENCOUNTER — Encounter (HOSPITAL_COMMUNITY): Payer: Self-pay

## 2016-06-21 NOTE — Progress Notes (Signed)
Discharge Summary  Patient Details  Name: [name] MRN: [id number] Date of Birth: 03-13-1945 Referring Provider:     Pulmonary Rehab Walk Test from 04/13/2016 in [facility]  Referring Provider  Dr. Chase Caller       Number of Visits: 5  Reason for Discharge:  Early Exit:  Personal  Smoking History:  History  Smoking Status  . Former Smoker  . Packs/day: 0.30  . Years: 3.00  . Types: Cigarettes  . Start date: 11/08/1964  . Quit date: 11/09/1967  Smokeless Tobacco  . Never Used    Diagnosis:  No diagnosis found.  ADL UCSD:   Initial Exercise Prescription:   Discharge Exercise Prescription (Final Exercise Prescription Changes):     Exercise Prescription Changes - 05/18/16 1200      Response to Exercise   Blood Pressure (Admit) 104/60   Blood Pressure (Exercise) 130/80   Blood Pressure (Exit) 108/60   Heart Rate (Admit) 90 bpm   Heart Rate (Exercise) 95 bpm   Heart Rate (Exit) 81 bpm   Oxygen Saturation (Admit) 92 %   Oxygen Saturation (Exercise) 95 %   Oxygen Saturation (Exit) 94 %   Rating of Perceived Exertion (Exercise) 13   Perceived Dyspnea (Exercise) 2   Duration Progress to 45 minutes of aerobic exercise without signs/symptoms of physical distress   Intensity THRR unchanged     Progression   Progression Continue to progress workloads to maintain intensity without signs/symptoms of physical distress.     Resistance Training   Training Prescription Yes   Weight orange bands   Reps 10-15   Time 10 Minutes     NuStep   Level 3   Minutes 17   METs 2.2     Track   Laps 10   Minutes 17      Functional Capacity:   Psychological, QOL, Others - Outcomes: PHQ 2/9: Depression screen [facility] 2/9 04/05/2016 07/21/2015 05/05/2015 04/02/2015 03/17/2015  Decreased Interest 3 0 0 0 1  Down, Depressed, Hopeless 3 0 0 0 1  PHQ - 2 Score 6 0 0 0 2  Altered sleeping 3 - 0 1 3  Tired, decreased energy 3 - 0 1 3  Change in  appetite 3 - 0 0 0  Feeling bad or failure about yourself  0 - 0 0 1  Trouble concentrating 0 - 0 0 0  Moving slowly or fidgety/restless 3 - 0 0 0  Suicidal thoughts 0 - 0 0 0  PHQ-9 Score 18 - 0 2 9  Difficult doing work/chores Somewhat difficult - Not difficult at all Not difficult at all Not difficult at all  Some recent data might be hidden    Quality of Life:   Personal Goals: Goals established at orientation with interventions provided to work toward goal.    Personal Goals Discharge:     Goals and Risk Factor Review    Row Name 05/18/16 1424             Core Components/Risk Factors/Patient Goals Review   Personal Goals Review Weight Management/Obesity;Improve shortness of breath with ADL's;Increase knowledge of respiratory medications and ability to use respiratory devices properly.;Develop more efficient breathing techniques such as purse lipped breathing and diaphragmatic breathing and practicing self-pacing with activity.       Review see comment section in ITP       Expected Outcomes see admission expected outcomes          Nutrition &  Weight - Outcomes:    Nutrition:     Nutrition Therapy & Goals - 04/29/16 1025      Nutrition Therapy   Diet Therapeutic Lifestyle Changes     Personal Nutrition Goals   Nutrition Goal Wt loss of 1-2 lb/week to a wt loss goal of 6-12 lb at graduation from rehab.      Intervention Plan   Intervention Prescribe, educate and counsel regarding individualized specific dietary modifications aiming towards targeted core components such as weight, hypertension, lipid management, diabetes, heart failure and other comorbidities.   Expected Outcomes Short Term Goal: Understand basic principles of dietary content, such as calories, fat, sodium, cholesterol and nutrients.;Long Term Goal: Adherence to prescribed nutrition plan.      Nutrition Discharge:     Nutrition Assessments - 04/29/16 1026      Rate Your Plate Scores   Pre  Score 49      Education Questionnaire Score:  [name] was discharged from pulmonary rehab at her request. She has been suffering from depression symptoms since her dog died and since she has deconditioning from her last exacerbation/medication change. Her brother in law recently passed away and her husband states "[name] is not in a good place right now". She will be readmitted at her request.

## 2016-06-22 ENCOUNTER — Encounter (HOSPITAL_COMMUNITY): Payer: PPO

## 2016-06-23 DIAGNOSIS — R1111 Vomiting without nausea: Secondary | ICD-10-CM | POA: Diagnosis not present

## 2016-06-23 DIAGNOSIS — K219 Gastro-esophageal reflux disease without esophagitis: Secondary | ICD-10-CM | POA: Diagnosis not present

## 2016-06-24 ENCOUNTER — Encounter (HOSPITAL_COMMUNITY): Payer: PPO

## 2016-06-29 ENCOUNTER — Encounter (HOSPITAL_COMMUNITY): Payer: PPO

## 2016-07-01 ENCOUNTER — Encounter (HOSPITAL_COMMUNITY): Payer: PPO

## 2016-07-01 DIAGNOSIS — E1165 Type 2 diabetes mellitus with hyperglycemia: Secondary | ICD-10-CM | POA: Diagnosis not present

## 2016-07-01 DIAGNOSIS — E200 Idiopathic hypoparathyroidism: Secondary | ICD-10-CM | POA: Diagnosis not present

## 2016-07-06 ENCOUNTER — Encounter (HOSPITAL_COMMUNITY): Payer: PPO

## 2016-07-08 ENCOUNTER — Encounter (HOSPITAL_COMMUNITY): Payer: PPO

## 2016-07-13 ENCOUNTER — Encounter (HOSPITAL_COMMUNITY): Payer: PPO

## 2016-07-15 ENCOUNTER — Encounter (HOSPITAL_COMMUNITY): Payer: PPO

## 2016-07-15 DIAGNOSIS — M5136 Other intervertebral disc degeneration, lumbar region: Secondary | ICD-10-CM | POA: Diagnosis not present

## 2016-07-15 DIAGNOSIS — M5030 Other cervical disc degeneration, unspecified cervical region: Secondary | ICD-10-CM | POA: Diagnosis not present

## 2016-07-15 DIAGNOSIS — G894 Chronic pain syndrome: Secondary | ICD-10-CM | POA: Diagnosis not present

## 2016-07-20 ENCOUNTER — Encounter (HOSPITAL_COMMUNITY): Payer: PPO

## 2016-07-22 ENCOUNTER — Encounter (HOSPITAL_COMMUNITY): Payer: PPO

## 2016-07-27 ENCOUNTER — Encounter (HOSPITAL_COMMUNITY): Payer: PPO

## 2016-08-06 DIAGNOSIS — D2221 Melanocytic nevi of right ear and external auricular canal: Secondary | ICD-10-CM | POA: Diagnosis not present

## 2016-08-06 DIAGNOSIS — D2261 Melanocytic nevi of right upper limb, including shoulder: Secondary | ICD-10-CM | POA: Diagnosis not present

## 2016-08-06 DIAGNOSIS — D2239 Melanocytic nevi of other parts of face: Secondary | ICD-10-CM | POA: Diagnosis not present

## 2016-08-06 DIAGNOSIS — D2271 Melanocytic nevi of right lower limb, including hip: Secondary | ICD-10-CM | POA: Diagnosis not present

## 2016-08-06 DIAGNOSIS — L720 Epidermal cyst: Secondary | ICD-10-CM | POA: Diagnosis not present

## 2016-08-06 DIAGNOSIS — D225 Melanocytic nevi of trunk: Secondary | ICD-10-CM | POA: Diagnosis not present

## 2016-08-06 DIAGNOSIS — L853 Xerosis cutis: Secondary | ICD-10-CM | POA: Diagnosis not present

## 2016-08-06 DIAGNOSIS — D2272 Melanocytic nevi of left lower limb, including hip: Secondary | ICD-10-CM | POA: Diagnosis not present

## 2016-08-06 DIAGNOSIS — D2262 Melanocytic nevi of left upper limb, including shoulder: Secondary | ICD-10-CM | POA: Diagnosis not present

## 2016-08-06 DIAGNOSIS — L821 Other seborrheic keratosis: Secondary | ICD-10-CM | POA: Diagnosis not present

## 2016-08-06 DIAGNOSIS — L814 Other melanin hyperpigmentation: Secondary | ICD-10-CM | POA: Diagnosis not present

## 2016-08-26 DIAGNOSIS — E889 Metabolic disorder, unspecified: Secondary | ICD-10-CM | POA: Diagnosis not present

## 2016-08-26 DIAGNOSIS — I129 Hypertensive chronic kidney disease with stage 1 through stage 4 chronic kidney disease, or unspecified chronic kidney disease: Secondary | ICD-10-CM | POA: Diagnosis not present

## 2016-08-26 DIAGNOSIS — M9080 Osteopathy in diseases classified elsewhere, unspecified site: Secondary | ICD-10-CM | POA: Diagnosis not present

## 2016-08-26 DIAGNOSIS — N183 Chronic kidney disease, stage 3 (moderate): Secondary | ICD-10-CM | POA: Diagnosis not present

## 2016-08-26 DIAGNOSIS — D631 Anemia in chronic kidney disease: Secondary | ICD-10-CM | POA: Diagnosis not present

## 2016-08-26 DIAGNOSIS — E559 Vitamin D deficiency, unspecified: Secondary | ICD-10-CM | POA: Diagnosis not present

## 2016-08-30 DIAGNOSIS — K219 Gastro-esophageal reflux disease without esophagitis: Secondary | ICD-10-CM | POA: Diagnosis not present

## 2016-08-30 DIAGNOSIS — Z0000 Encounter for general adult medical examination without abnormal findings: Secondary | ICD-10-CM | POA: Diagnosis not present

## 2016-08-30 DIAGNOSIS — Z1389 Encounter for screening for other disorder: Secondary | ICD-10-CM | POA: Diagnosis not present

## 2016-08-30 DIAGNOSIS — I2510 Atherosclerotic heart disease of native coronary artery without angina pectoris: Secondary | ICD-10-CM | POA: Diagnosis not present

## 2016-08-30 DIAGNOSIS — F324 Major depressive disorder, single episode, in partial remission: Secondary | ICD-10-CM | POA: Diagnosis not present

## 2016-08-30 DIAGNOSIS — N183 Chronic kidney disease, stage 3 (moderate): Secondary | ICD-10-CM | POA: Diagnosis not present

## 2016-08-30 DIAGNOSIS — E7800 Pure hypercholesterolemia, unspecified: Secondary | ICD-10-CM | POA: Diagnosis not present

## 2016-08-30 DIAGNOSIS — E039 Hypothyroidism, unspecified: Secondary | ICD-10-CM | POA: Diagnosis not present

## 2016-08-30 DIAGNOSIS — I10 Essential (primary) hypertension: Secondary | ICD-10-CM | POA: Diagnosis not present

## 2016-09-01 DIAGNOSIS — N390 Urinary tract infection, site not specified: Secondary | ICD-10-CM | POA: Diagnosis not present

## 2016-09-01 DIAGNOSIS — N183 Chronic kidney disease, stage 3 (moderate): Secondary | ICD-10-CM | POA: Diagnosis not present

## 2016-09-01 DIAGNOSIS — R801 Persistent proteinuria, unspecified: Secondary | ICD-10-CM | POA: Diagnosis not present

## 2016-09-01 DIAGNOSIS — N179 Acute kidney failure, unspecified: Secondary | ICD-10-CM | POA: Diagnosis not present

## 2016-09-02 DIAGNOSIS — N261 Atrophy of kidney (terminal): Secondary | ICD-10-CM | POA: Diagnosis not present

## 2016-09-02 DIAGNOSIS — N183 Chronic kidney disease, stage 3 (moderate): Secondary | ICD-10-CM | POA: Diagnosis not present

## 2016-09-04 DIAGNOSIS — N390 Urinary tract infection, site not specified: Secondary | ICD-10-CM | POA: Diagnosis not present

## 2016-09-04 DIAGNOSIS — N179 Acute kidney failure, unspecified: Secondary | ICD-10-CM | POA: Diagnosis not present

## 2016-09-15 DIAGNOSIS — N410 Acute prostatitis: Secondary | ICD-10-CM | POA: Diagnosis not present

## 2016-09-29 ENCOUNTER — Ambulatory Visit (INDEPENDENT_AMBULATORY_CARE_PROVIDER_SITE_OTHER): Payer: PPO | Admitting: Neurology

## 2016-09-29 ENCOUNTER — Encounter: Payer: Self-pay | Admitting: Neurology

## 2016-09-29 VITALS — BP 107/76 | HR 65 | Ht 59.0 in | Wt 151.5 lb

## 2016-09-29 DIAGNOSIS — R569 Unspecified convulsions: Secondary | ICD-10-CM | POA: Diagnosis not present

## 2016-09-29 DIAGNOSIS — Z23 Encounter for immunization: Secondary | ICD-10-CM | POA: Diagnosis not present

## 2016-09-29 NOTE — Progress Notes (Signed)
Reason for visit: Seizures  [name] is an 71 y.o. female  History of present illness:  [name] is a 71 year old left-handed white female with a history of seizures that have been under good control. The patient has not had any recurrence of the seizure since last seen. She has mild bilateral carpal tunnel syndrome but she has not wished to pursue any surgical therapy for this. The patient has recently had a transient slight worsening in renal function, she has chronic renal insufficiency. Her most recent estimated GFR was 41. The patient returns to this office for an evaluation. She is tolerating the Keppra well.  Past Medical History:  Diagnosis Date  . Anxiety   . Arthritis   . Carpal tunnel syndrome, bilateral 07/31/2015  . Cervical spondylosis without myelopathy   . Chronic chest pain   . Chronic kidney disease   . Chronic low back pain   . Chronotropic incompetence   . CKD (chronic kidney disease), stage III   . Coronary artery disease    a. inf-post MI 2011 s/p DES to RCA. b. DES to RCA 06/2010. c. DES to LAD 2014, residual dz treated medically.  . Depression   . Esophageal stricture   . GERD (gastroesophageal reflux disease)   . Gross hematuria   . History of esophageal dilatation    FOR STRIUCTURE  . History of gout   . History of hiatal hernia   . History of kidney stones   . Hyperlipidemia   . Hypertension   . Hypothyroidism   . Internal hemorrhoids   . Irritable bowel syndrome   . Obesity   . Orthostatic hypotension   . Partial seizure disorder (HCC) NEUROLOGIST-- DR [name]   NOCTURNAL  . PONV (postoperative nausea and vomiting)    hard time getting iv site-had to do neck stick 2 yrs ago  . Pulmonary fibrosis ([place])   . S/P pericardial cyst excision    02-05-2013  benign  . Seizures ([place])    none in yrs  . SVT (supraventricular tachycardia) ([place])   . Trigger finger, acquired 09/30/2015   Right middle finger  . Vitamin D deficiency     Past  Surgical History:  Procedure Laterality Date  . BIOPSY OF MEDIASTINAL MASS N/A 02/05/2013   Procedure: RESECTION OF MEDIASTINAL MASS;  Surgeon: Ivin Poot, MD;  Location: [facility];  Service: Thoracic;  Laterality: N/A;  . CARDIAC CATHETERIZATION Right 01/24/2015   Procedure: right femoral ARTERIAL LINE INSERTION;  Surgeon: Ivin Poot, MD;  Location: [facility];  Service: Thoracic;  Laterality: Right;  . CARDIOVASCULAR STRESS TEST  11-22-2013  dr Irish Lack   normal lexiscan study/  no ischemia/  normal LVF and wall motion/ ef 81%  . COLONOSCOPY  last one 2014  . CORONARY ANGIOGRAM  03/10/2011   Procedure: CORONARY ANGIOGRAM;  Surgeon: Jettie Booze, MD;  Location: [facility] CATH LAB;  Service: Cardiovascular;;  . CORONARY ANGIOPLASTY WITH STENT PLACEMENT  11-22-2009  dr Irish Lack   Acute inferoposterior MI/  ef 60%,  PCI with DES x1 to dRCA,  25%  mLAD  . CORONARY ANGIOPLASTY WITH STENT PLACEMENT  06-26-2010  dr Daneen Schick   Cutting balloon angioplasty to dRCA with DES x1,  40% mLAD (non-obstructive CAD)  . CYSTOSCOPY WITH RETROGRADE PYELOGRAM, URETEROSCOPY AND STENT PLACEMENT Right 02/13/2014   Procedure: CYSTOSCOPY WITH RETROGRADE PYELOGRAM, URETEROSCOPY AND STENT PLACEMENT;  Surgeon: Bernestine Amass, MD;  Location: [facility];  Service: Urology;  Laterality: Right;  . ECTOPIC PREGNANCY SURGERY  YRS AGO   SALPINGECTOMY  . ESOPHAGOGASTRODUODENOSCOPY N/A 02/15/2014   Procedure: ESOPHAGOGASTRODUODENOSCOPY (EGD);  Surgeon: Jerene Bears, MD;  Location: [facility];  Service: Endoscopy;  Laterality: N/A;  . ESOPHAGOGASTRODUODENOSCOPY (EGD) WITH ESOPHAGEAL DILATION  05-20-2011  . HOLMIUM LASER APPLICATION Right 0/0/9233   Procedure: HOLMIUM LASER APPLICATION;  Surgeon: Bernestine Amass, MD;  Location[facility];  Service: Urology;  Laterality: Right;  . LEFT HEART CATHETERIZATION WITH CORONARY ANGIOGRAM N/A 03/14/2012   Procedure: LEFT HEART CATHETERIZATION WITH CORONARY  ANGIOGRAM;  Surgeon: Sueanne Margarita, MD;  Location: [facility] CATH LAB;  Service: Cardiovascular;  Laterality: N/A;  Normal LM,  50% pLAD,  70% mLAD,  D2 50-70%, very tortuous LAD,  70-82% ostial LCFX,  50% in-stent restenosis of  dRCA and mRCA  stent and 50-70% ostial PDA,  normal LVSF, ef 60%  . LUNG BIOPSY Right 01/24/2015   Procedure: RIGHT LUNG BIOPSY;  Surgeon: Ivin Poot, MD;  Location: [facility];  Service: Thoracic;  Laterality: Right;  . MEDIASTERNOTOMY N/A 02/05/2013   Procedure: MEDIAN STERNOTOMY;  Surgeon: Ivin Poot, MD;  Location: [facility];  Service: Thoracic;  Laterality: N/A;  . NEEDLE GUIDED EXCISION BREAST CALCIFICATIONS Right 08-09-2008  . PERCUTANEOUS CORONARY STENT INTERVENTION (PCI-S) N/A 04/03/2012   Procedure: PERCUTANEOUS CORONARY STENT INTERVENTION (PCI-S);  Surgeon: Jettie Booze, MD;  Location: [facility] CATH LAB;  Service: Cardiovascular;  Laterality: N/A;   Successful PCI  mLAD with 2.75x12 Promus stent, postdilated to >0.11mm  . THORACOTOMY Right 01/24/2015   Procedure: RIGHT MINI/LIMITED THORACOTOMY;  Surgeon: Ivin Poot, MD;  Location: [facility];  Service: Thoracic;  Laterality: Right;  . TRANSTHORACIC ECHOCARDIOGRAM  11-17-2011   grade I diastolic dysfunction/  ef 55-60%  . VIDEO BRONCHOSCOPY N/A 01/24/2015   Procedure: RIGHT VIDEO BRONCHOSCOPY;  Surgeon: Ivin Poot, MD;  Location[facility];  Service: Thoracic;  Laterality: N/A;    Family History  Problem Relation Age of Onset  . Breast cancer Sister   . Cancer Sister        breast  . Heart disease Father   . Hypertension Father   . Emphysema Father   . Coronary artery disease Father   . Diabetes Mellitus I Father   . Heart attack Mother   . CVA Mother   . Breast cancer Unknown        niece  . Colon cancer Neg Hx     Social history:  reports that she quit smoking about 48 years ago. Her smoking use included Cigarettes. She started smoking about 51 years ago. She has a 0.90 pack-year smoking history. She has  never used smokeless tobacco. She reports that she does not drink alcohol or use drugs.   No Active Allergies  Medications:  Prior to Admission medications   Medication Sig Start Date End Date Taking? Authorizing Provider  ALPRAZolam (XANAX) 0.25 MG tablet Take 0.25 mg by mouth at bedtime.   Yes [provider]  aspirin EC 81 MG tablet Take 81 mg by mouth daily.   Yes [provider]  calcitRIOL (ROCALTROL) 0.5 MCG capsule Take 0.5 mcg by mouth daily.   Yes [provider]  clopidogrel (PLAVIX) 75 MG tablet TAKE 1 TABLET (75 MG TOTAL) BY MOUTH DAILY WITH BREAKFAST. 06/15/16  Yes Turner, Traci R, MD  doxazosin (CARDURA) 2 MG tablet TAKE 1 TABLET (2 MG TOTAL) BY MOUTH DAILY. 06/01/16  Yes Turner, Eber Hong,  MD  DULoxetine (CYMBALTA) 20 MG capsule Take 20 mg by mouth daily.   Yes [provider]  ferrous sulfate 325 (65 FE) MG tablet Take 325 mg by mouth.   Yes [provider]  furosemide (LASIX) 40 MG tablet Take 1 tablet (40 mg total) by mouth every other day. 04/08/16  Yes Turner, Eber Hong, MD  HYDROcodone-acetaminophen (NORCO) 10-325 MG tablet Take 1 tablet by mouth every 6 (six) hours as needed.   Yes [provider]  isosorbide mononitrate (IMDUR) 60 MG 24 hr tablet TAKE 1 AND 1/2 TABLETS BY MOUTH EVERY DAY 06/09/16  Yes Turner, Eber Hong, MD  levETIRAcetam (KEPPRA) 500 MG tablet Take 1 tablet (500 mg total) by mouth 2 (two) times daily. 06/14/16  Yes Ward Givens, NP  levothyroxine (SYNTHROID, LEVOTHROID) 112 MCG tablet Take 112 mcg by mouth daily. 06/25/15  Yes [provider]  losartan (COZAAR) 100 MG tablet Take 100 mg by mouth every morning.    Yes [provider]  magnesium oxide (MAG-OX) 400 MG tablet Take 1 tablet (400 mg total) by mouth 2 (two) times daily. 03/04/16  Yes Dunn, Dayna N, PA-C  nebivolol (BYSTOLIC) 5 MG tablet Take 1 tablet (5 mg total) by mouth daily. 04/01/16  Yes Turner, Traci R, MD  NITROSTAT 0.4 MG SL  tablet PLACE 1 TABLET (0.4 MG TOTAL) UNDER THE TONGUE EVERY 5 (FIVE) MINUTES AS NEEDED FOR CHEST PAIN. 10/06/15  Yes Turner, Eber Hong, MD  pantoprazole (PROTONIX) 40 MG tablet Take 1 tablet (40 mg total) by mouth 2 (two) times daily. 01/23/16  Yes Irene Shipper, MD  potassium chloride SA (K-DUR,KLOR-CON) 20 MEQ tablet Take 40 mEq by mouth every other day.   Yes [provider]  rosuvastatin (CRESTOR) 20 MG tablet TAKE 1 TABLET (20 MG TOTAL) BY MOUTH EVERY MORNING. 02/03/16  Yes Turner, Traci R, MD  amLODipine (NORVASC) 5 MG tablet Take 1 tablet (5 mg total) by mouth daily. 05/10/16 08/08/16  Allred, Jeneen Rinks, MD  diltiazem (CARDIZEM CD) 240 MG 24 hr capsule Take 1 capsule (240 mg total) by mouth daily. 03/24/16 06/22/16  Allred, Jeneen Rinks, MD    ROS:  Out of a complete 14 system review of symptoms, the patient complains only of the following symptoms, and all other reviewed systems are negative.  Seizures  Blood pressure 107/76, pulse 65, height 4\' 11"  (1.499 m), weight 151 lb 8 oz (68.7 kg).  Physical Exam  General: The patient is alert and cooperative at the time of the examination. The patient is moderately obese.  Skin: No significant peripheral edema is noted.   Neurologic Exam  Mental status: The patient is alert and oriented x 3 at the time of the examination. The patient has apparent normal recent and remote memory, with an apparently normal attention span and concentration ability.   Cranial nerves: Facial symmetry is present. Speech is normal, no aphasia or dysarthria is noted. Extraocular movements are full. Visual fields are full.  Motor: The patient has good strength in all 4 extremities.  Sensory examination: Soft touch sensation is symmetric on the face, arms, and legs.  Coordination: The patient has good finger-nose-finger and heel-to-shin bilaterally.  Gait and station: The patient has a normal gait. Tandem gait is normal. Romberg is negative. No drift is  seen.  Reflexes: Deep tendon reflexes are symmetric.   Assessment/Plan:  1. Seizure disorder, well controlled  2. Bilateral carpal tunnel syndrome  The patient will continue the [place], she will follow-up  in one year. She does operate a motor vehicle without difficulty.  Jill Alexanders MD 09/29/2016 10:35 AM  [facility] [address] [place], [place] [address]  Phone [phone] Fax [phone]

## 2016-11-04 DIAGNOSIS — N179 Acute kidney failure, unspecified: Secondary | ICD-10-CM | POA: Diagnosis not present

## 2016-11-08 DIAGNOSIS — N183 Chronic kidney disease, stage 3 (moderate): Secondary | ICD-10-CM | POA: Diagnosis not present

## 2016-11-08 DIAGNOSIS — E889 Metabolic disorder, unspecified: Secondary | ICD-10-CM | POA: Diagnosis not present

## 2016-11-08 DIAGNOSIS — I129 Hypertensive chronic kidney disease with stage 1 through stage 4 chronic kidney disease, or unspecified chronic kidney disease: Secondary | ICD-10-CM | POA: Diagnosis not present

## 2016-11-08 DIAGNOSIS — R3129 Other microscopic hematuria: Secondary | ICD-10-CM | POA: Diagnosis not present

## 2016-11-16 ENCOUNTER — Ambulatory Visit (INDEPENDENT_AMBULATORY_CARE_PROVIDER_SITE_OTHER): Payer: PPO | Admitting: Cardiology

## 2016-11-16 ENCOUNTER — Encounter: Payer: Self-pay | Admitting: Cardiology

## 2016-11-16 VITALS — BP 114/64 | HR 70 | Ht 59.0 in | Wt 152.4 lb

## 2016-11-16 DIAGNOSIS — I2510 Atherosclerotic heart disease of native coronary artery without angina pectoris: Secondary | ICD-10-CM

## 2016-11-16 DIAGNOSIS — I951 Orthostatic hypotension: Secondary | ICD-10-CM | POA: Diagnosis not present

## 2016-11-16 DIAGNOSIS — E7800 Pure hypercholesterolemia, unspecified: Secondary | ICD-10-CM

## 2016-11-16 DIAGNOSIS — I10 Essential (primary) hypertension: Secondary | ICD-10-CM | POA: Diagnosis not present

## 2016-11-16 DIAGNOSIS — I471 Supraventricular tachycardia: Secondary | ICD-10-CM | POA: Diagnosis not present

## 2016-11-16 NOTE — Patient Instructions (Signed)
Medication Instructions:   Your physician recommends that you continue on your current medications as directed. Please refer to the Current Medication list given to you today.     Labwork:  IN ONE WEEK TO CHECK LIPIDS AND LIVER FUNCTION TEST---PLEASE COME FASTING TO THIS LAB APPOINTMENT       Follow-Up:  Your physician wants you to follow-up in: [place] will receive a reminder letter in the mail two months in advance. If you don't receive a letter, please call our office to schedule the follow-up appointment.   .     If you need a refill on your cardiac medications before your next appointment, please call your pharmacy.

## 2016-11-16 NOTE — Progress Notes (Signed)
Cardiology Office Note:    Date:  11/16/2016   ID:  [name], DOB 03/20/1945, MRN [id number]  PCP:  Carol Ada, MD  Cardiologist:  Fransico Him, MD   Referring MD: Carol Ada, MD   Chief Complaint  Patient presents with  . Coronary Artery Disease  . Hypertension  . Hyperlipidemia    History of Present Illness:    [name] is a 71 y.o. female with a hx of CAD/HTN, pericardial cyst s/p resection and dyslipidemia who presents today for followup. She has a history of chronic CP with esophageal strictures and has had esophageal dilatation.She underwent VATS procedure via mini thoracotomy syndrome with open lung bx and was diagnosed with interstitial lung disease. She has chronic CP that has been felt to be noncardiac in the past and has started to reoccur. She has chronic DOE.   She underwent a cardiopulmonary stress test by Pulmonary to evaluate her SOB and it showed deconditioning and poor HR response to exercise. At the request of the pulmonologist her CCB and BB were stopped as they though her SOB was due to chronotropic incompetence.  She then started having palpitations and was seen by her PCP, Dr. Tamala Julian, and was told to take bystolic PRN for palpitations.  She started having more palpitations and was told to take bystolic daily instead of PRN.  She then saw Dr. [name] who put her back on Cardizem and dropped her amlodipine to 5mg  daily.    A nuclear stress test was done in January 2018 which showed no ischemia.  This was done for similar chest pain that she had in the past.  This chest pain occurred around the time she lost her dog.  She is here today for followup and is doing well.  She has chronic chest pain that is felt noncardiac in etiology.  She has chronic problems with GERD and it seems to have flared and she is going to see her GI MD.  She has chronic DOE from chronic deconditioning and chronotropic incompetence. She says that her SOB has actually  improved.  She has noticed sometimes that she wakes up SOB but thinks it is related to her GERD.   She denies any PND, orthopnea.  Her palpitations have improved back on CCB and Bystolic.  She is compliant with her meds and is tolerating meds with no SE.     Past Medical History:  Diagnosis Date  . Anxiety   . Arthritis   . Carpal tunnel syndrome, bilateral 07/31/2015  . Cervical spondylosis without myelopathy   . Chronic chest pain   . Chronic kidney disease   . Chronic low back pain   . Chronotropic incompetence   . CKD (chronic kidney disease), stage III ([place])   . Coronary artery disease    a. inf-post MI 2011 s/p DES to RCA. b. DES to RCA 06/2010. c. DES to LAD 2014, residual dz treated medically.  . Depression   . Esophageal stricture   . GERD (gastroesophageal reflux disease)   . Gross hematuria   . History of esophageal dilatation    FOR STRIUCTURE  . History of gout   . History of hiatal hernia   . History of kidney stones   . Hyperlipidemia   . Hypertension   . Hypothyroidism   . Internal hemorrhoids   . Irritable bowel syndrome   . Obesity   . Orthostatic hypotension   . Partial seizure disorder (HCC) NEUROLOGIST-- DR WILLIS   NOCTURNAL  .  PONV (postoperative nausea and vomiting)    hard time getting iv site-had to do neck stick 2 yrs ago  . Pulmonary fibrosis ([place])   . S/P pericardial cyst excision    02-05-2013  benign  . Seizures ([place])    none in yrs  . SVT (supraventricular tachycardia) ([place])   . Trigger finger, acquired 09/30/2015   Right middle finger  . Vitamin D deficiency     Past Surgical History:  Procedure Laterality Date  . CARDIOVASCULAR STRESS TEST  11-22-2013  dr Irish Lack   normal lexiscan study/  no ischemia/  normal LVF and wall motion/ ef 81%  . COLONOSCOPY  last one 2014  . CORONARY ANGIOPLASTY WITH STENT PLACEMENT  11-22-2009  dr Irish Lack   Acute inferoposterior MI/  ef 60%,  PCI with DES x1 to dRCA,  25%  mLAD  . CORONARY ANGIOPLASTY  WITH STENT PLACEMENT  06-26-2010  dr Daneen Schick   Cutting balloon angioplasty to dRCA with DES x1,  40% mLAD (non-obstructive CAD)  . ECTOPIC PREGNANCY SURGERY  YRS AGO   SALPINGECTOMY  . ESOPHAGOGASTRODUODENOSCOPY (EGD) WITH ESOPHAGEAL DILATION  05-20-2011  . NEEDLE GUIDED EXCISION BREAST CALCIFICATIONS Right 08-09-2008  . TRANSTHORACIC ECHOCARDIOGRAM  11-17-2011   grade I diastolic dysfunction/  ef 55-60%    Current Medications: Current Meds  Medication Sig  . ALPRAZolam (XANAX) 0.25 MG tablet Take 0.25 mg by mouth at bedtime.  Marland Kitchen amLODipine (NORVASC) 5 MG tablet Take 1 tablet (5 mg total) by mouth daily.  Marland Kitchen aspirin EC 81 MG tablet Take 81 mg by mouth daily.  . calcitRIOL (ROCALTROL) 0.5 MCG capsule Take 0.5 mcg by mouth daily.  . clopidogrel (PLAVIX) 75 MG tablet TAKE 1 TABLET (75 MG TOTAL) BY MOUTH DAILY WITH BREAKFAST.  Marland Kitchen doxazosin (CARDURA) 2 MG tablet TAKE 1 TABLET (2 MG TOTAL) BY MOUTH DAILY.  . DULoxetine (CYMBALTA) 20 MG capsule Take 20 mg by mouth daily.  . ferrous sulfate 325 (65 FE) MG tablet Take 325 mg by mouth.  . furosemide (LASIX) 40 MG tablet Take 1 tablet (40 mg total) by mouth every other day.  Marland Kitchen HYDROcodone-acetaminophen (NORCO) 10-325 MG tablet Take 1 tablet by mouth every 6 (six) hours as needed.  . isosorbide mononitrate (IMDUR) 60 MG 24 hr tablet TAKE 1 AND 1/2 TABLETS BY MOUTH EVERY DAY  . levETIRAcetam (KEPPRA) 500 MG tablet Take 1 tablet (500 mg total) by mouth 2 (two) times daily.  Marland Kitchen levothyroxine (SYNTHROID, LEVOTHROID) 112 MCG tablet Take 112 mcg by mouth daily.  Marland Kitchen losartan (COZAAR) 50 MG tablet Take 50 mg daily by mouth.  . magnesium oxide (MAG-OX) 400 MG tablet Take 1 tablet (400 mg total) by mouth 2 (two) times daily.  . nebivolol (BYSTOLIC) 5 MG tablet Take 1 tablet (5 mg total) by mouth daily.  Marland Kitchen NITROSTAT 0.4 MG SL tablet PLACE 1 TABLET (0.4 MG TOTAL) UNDER THE TONGUE EVERY 5 (FIVE) MINUTES AS NEEDED FOR CHEST PAIN.  Marland Kitchen pantoprazole (PROTONIX) 40 MG  tablet Take 1 tablet (40 mg total) by mouth 2 (two) times daily.  . potassium chloride SA (K-DUR,KLOR-CON) 20 MEQ tablet Take 40 mEq by mouth every other day.  . rosuvastatin (CRESTOR) 20 MG tablet TAKE 1 TABLET (20 MG TOTAL) BY MOUTH EVERY MORNING.     Allergies:   Patient has no active allergies.   Social History   Socioeconomic History  . Marital status: Married    Spouse name: Fritz Pickerel  . Number of children: 0  .  Years of education: hs  . Highest education level: None  Social Needs  . Financial resource strain: None  . Food insecurity - worry: None  . Food insecurity - inability: None  . Transportation needs - medical: None  . Transportation needs - non-medical: None  Occupational History  . Occupation: Retired    Fish farm manager: RETIRED  Tobacco Use  . Smoking status: Former Smoker    Packs/day: 0.30    Years: 3.00    Pack years: 0.90    Types: Cigarettes    Start date: 11/08/1964    Last attempt to quit: 11/09/1967    Years since quitting: 49.0  . Smokeless tobacco: Never Used  Substance and Sexual Activity  . Alcohol use: No    Alcohol/week: 0.0 oz  . Drug use: No  . Sexual activity: None  Other Topics Concern  . None  Social History Narrative   Lives at home, married   Left-handed   Daily caffeine use: coffee.     Family History: The patient's family history includes Breast cancer in her sister and unknown relative; CVA in her mother; Cancer in her sister; Coronary artery disease in her father; Diabetes Mellitus I in her father; Emphysema in her father; Heart attack in her mother; Heart disease in her father; Hypertension in her father. There is no history of Colon cancer.  ROS:   Please see the history of present illness.    ROS  All other systems reviewed and negative.   EKGs/Labs/Other Studies Reviewed:    The following studies were reviewed today: none  EKG:  EKG is not ordered today.    Recent Labs: 02/04/2016: ALT 12 02/19/2016: Hemoglobin 13.0;  Platelets 310 02/25/2016: NT-Pro BNP 836; TSH 0.841 03/04/2016: BUN 18; Creatinine, Ser 1.04; Magnesium 1.2; Potassium 3.8; Sodium 143   Recent Lipid Panel    Component Value Date/Time   CHOL 162 02/04/2016 1004   TRIG 68 02/04/2016 1004   HDL 78 02/04/2016 1004   CHOLHDL 2.1 02/04/2016 1004   CHOLHDL 1.8 06/06/2015 0928   VLDL 11 06/06/2015 0928   LDLCALC 70 02/04/2016 1004    Physical Exam:    VS:  BP 114/64   Pulse 70   Ht 4\' 11"  (1.499 m)   Wt 152 lb 6.4 oz (69.1 kg)   SpO2 92%   BMI 30.78 kg/m     Wt Readings from Last 3 Encounters:  11/16/16 152 lb 6.4 oz (69.1 kg)  09/29/16 151 lb 8 oz (68.7 kg)  05/19/16 156 lb (70.8 kg)     GEN:  Well nourished, well developed in no acute distress HEENT: Normal NECK: No JVD; No carotid bruits LYMPHATICS: No lymphadenopathy CARDIAC: RRR, no rubs, gallops.  2/6 SM at RUSB to LLSB RESPIRATORY:  Clear to auscultation without rales, wheezing or rhonchi  ABDOMEN: Soft, non-tender, non-distended MUSCULOSKELETAL:  No edema; No deformity  SKIN: Warm and dry NEUROLOGIC:  Alert and oriented x 3 PSYCHIATRIC:  Normal affect   ASSESSMENT:    1. Atherosclerosis of native coronary artery of native heart without angina pectoris   2. Essential hypertension   3. Paroxysmal SVT (supraventricular tachycardia) ([place])   4. Syncope due to orthostatic hypotension   5. Pure hypercholesterolemia    PLAN:    In order of problems listed above:  1.  ASCAD -she has chronic noncardiac chest pain which is unchanged.  She will continue on aspirin 81 mg daily, Plavix 75 mg daily, Imdur 60 mg daily, beta-blocker, statin.  2.  Hypertension-blood pressure well controlled on exam today.  She will continue on Cardizem CD 009 mg daily, Bystolic 5 mg daily, losartan 50 mg daily. Her last BMET showed creatinine of 1.41 last month.    3.  SVT with no recurrence of palpitations.  She will continue on Cardizem CD 240 mg daily and Bystolic 5 mg daily.  4.   Orthostatic hypotension-she has had no further problems with  Syncope but occasionally gets dizzy when getting up too fast.  5.  Hyperlipidemia with LDL goal less than 70.  Her LDL last January was at goal.  She will continue on rosuvastatin 20 mg daily.  I will get a repeat FLP and ALT.     Medication Adjustments/Labs and Tests Ordered: Current medicines are reviewed at length with the patient today.  Concerns regarding medicines are outlined above.  No orders of the defined types were placed in this encounter.  No orders of the defined types were placed in this encounter.   Signed, Fransico Him, MD  11/16/2016 3:18 PM    [place]

## 2016-11-23 ENCOUNTER — Other Ambulatory Visit: Payer: PPO

## 2016-11-23 DIAGNOSIS — I471 Supraventricular tachycardia: Secondary | ICD-10-CM

## 2016-11-23 DIAGNOSIS — E7800 Pure hypercholesterolemia, unspecified: Secondary | ICD-10-CM

## 2016-11-23 DIAGNOSIS — I2510 Atherosclerotic heart disease of native coronary artery without angina pectoris: Secondary | ICD-10-CM | POA: Diagnosis not present

## 2016-11-23 DIAGNOSIS — I951 Orthostatic hypotension: Secondary | ICD-10-CM

## 2016-11-23 DIAGNOSIS — I10 Essential (primary) hypertension: Secondary | ICD-10-CM | POA: Diagnosis not present

## 2016-11-23 LAB — HEPATIC FUNCTION PANEL
ALT: 6 IU/L (ref 0–32)
AST: 18 IU/L (ref 0–40)
Albumin: 4.4 g/dL (ref 3.5–4.8)
Alkaline Phosphatase: 85 IU/L (ref 39–117)
Bilirubin Total: 0.3 mg/dL (ref 0.0–1.2)
Bilirubin, Direct: 0.09 mg/dL (ref 0.00–0.40)
Total Protein: 6.8 g/dL (ref 6.0–8.5)

## 2016-11-23 LAB — LIPID PANEL
Chol/HDL Ratio: 1.9 ratio (ref 0.0–4.4)
Cholesterol, Total: 132 mg/dL (ref 100–199)
HDL: 70 mg/dL (ref >39)
LDL Calculated: 51 mg/dL (ref 0–99)
Triglycerides: 56 mg/dL (ref 0–149)
VLDL Cholesterol Cal: 11 mg/dL (ref 5–40)

## 2016-11-24 ENCOUNTER — Telehealth: Payer: Self-pay | Admitting: *Deleted

## 2016-11-24 NOTE — Telephone Encounter (Signed)
Spoke to pt about results and pt is aware and verbalized understanding

## 2016-12-09 ENCOUNTER — Other Ambulatory Visit: Payer: Self-pay | Admitting: Podiatry

## 2016-12-09 ENCOUNTER — Ambulatory Visit: Payer: PPO | Admitting: Podiatry

## 2016-12-09 ENCOUNTER — Encounter: Payer: Self-pay | Admitting: Podiatry

## 2016-12-09 ENCOUNTER — Ambulatory Visit (INDEPENDENT_AMBULATORY_CARE_PROVIDER_SITE_OTHER): Payer: PPO

## 2016-12-09 DIAGNOSIS — M79671 Pain in right foot: Secondary | ICD-10-CM | POA: Diagnosis not present

## 2016-12-09 DIAGNOSIS — M722 Plantar fascial fibromatosis: Secondary | ICD-10-CM

## 2016-12-09 MED ORDER — TRIAMCINOLONE ACETONIDE 10 MG/ML IJ SUSP
10.0000 mg | Freq: Once | INTRAMUSCULAR | Status: AC
  Administered 2016-12-09: 10 mg

## 2016-12-09 NOTE — Progress Notes (Signed)
Subjective:   Patient ID: [name], female   DOB: 71 y.o.   MRN: [id number]   HPI Patient presents with pain of the right heel of one week duration.  Patient states she did very well for around 9 months and pain has returned and she went to a festival walking on her foot.   ROS      Objective:  Physical Exam  X-rays were taken of patient and there is noted to be inflammation pain of the sub-heel right with mild discomfort of the heel bone itself.  Neurovascular status was found to be intact.     Assessment:  Acute plantar fasciitis right with inflammation and fluid around the medial band.     Plan:  At this time reviewed x-rays and recommended injection treatment and injected with 3 mg of dexamethasone Kenalog 5 mg Xylocaine and instructed on supportive shoe gear.  Patient will be seen back on an as-needed basis.  X-ray report there is small spur formation with no indications of stress fracture of the calcaneus itself.

## 2016-12-09 NOTE — Patient Instructions (Signed)

## 2016-12-13 DIAGNOSIS — E200 Idiopathic hypoparathyroidism: Secondary | ICD-10-CM | POA: Diagnosis not present

## 2016-12-13 DIAGNOSIS — E1165 Type 2 diabetes mellitus with hyperglycemia: Secondary | ICD-10-CM | POA: Diagnosis not present

## 2016-12-13 DIAGNOSIS — E039 Hypothyroidism, unspecified: Secondary | ICD-10-CM | POA: Diagnosis not present

## 2017-01-06 ENCOUNTER — Other Ambulatory Visit: Payer: Self-pay | Admitting: Cardiology

## 2017-01-06 MED ORDER — CLOPIDOGREL BISULFATE 75 MG PO TABS: ORAL_TABLET | ORAL | 3 refills | Status: DC

## 2017-01-19 DIAGNOSIS — E039 Hypothyroidism, unspecified: Secondary | ICD-10-CM | POA: Diagnosis not present

## 2017-01-19 DIAGNOSIS — I10 Essential (primary) hypertension: Secondary | ICD-10-CM | POA: Diagnosis not present

## 2017-01-19 DIAGNOSIS — E200 Idiopathic hypoparathyroidism: Secondary | ICD-10-CM | POA: Diagnosis not present

## 2017-01-19 DIAGNOSIS — E1165 Type 2 diabetes mellitus with hyperglycemia: Secondary | ICD-10-CM | POA: Diagnosis not present

## 2017-01-19 DIAGNOSIS — N189 Chronic kidney disease, unspecified: Secondary | ICD-10-CM | POA: Diagnosis not present

## 2017-01-24 ENCOUNTER — Ambulatory Visit: Payer: PPO | Admitting: Internal Medicine

## 2017-01-24 ENCOUNTER — Encounter: Payer: Self-pay | Admitting: Internal Medicine

## 2017-01-24 VITALS — BP 92/60 | HR 80 | Ht 59.0 in | Wt 149.8 lb

## 2017-01-24 DIAGNOSIS — R1013 Epigastric pain: Secondary | ICD-10-CM | POA: Diagnosis not present

## 2017-01-24 DIAGNOSIS — K219 Gastro-esophageal reflux disease without esophagitis: Secondary | ICD-10-CM

## 2017-01-24 DIAGNOSIS — R079 Chest pain, unspecified: Secondary | ICD-10-CM | POA: Diagnosis not present

## 2017-01-24 NOTE — Patient Instructions (Signed)

## 2017-01-24 NOTE — Progress Notes (Signed)
HISTORY OF PRESENT ILLNESS:  [name] is a 72 y.o. female with multiple significant medical problems as listed below. She presents today with a chief complaint of low chest/epigastric pain. This is a chronic problem. She does have a history of GERD for which she is on twice a day pantoprazole. No classic symptoms. Less problems with regurgitation recently. She does have chronic stable dysphagia which is unchanged. This is secondary to dysmotility primarily as well as the presence of a large caliber distal esophageal ring. Last fall she was having problems with lower chest discomfort which was evaluated by her cardiologist. This was felt to be noncardiac. GI etiology suggested. I have seen this patient on numerous occasions for chest pain. The overwhelming majority of been nongastrointestinal in origin including identifying a pericardial cyst for which she underwent surgery. In any event, she says her problems have been less severe recently. GI review of systems is otherwise negative except for intermittent problems with diarrhea which is not new. She is up-to-date on her screening colonoscopy. Her last upper endoscopy was performed in the hospital by Dr. Hilarie Fredrickson 3 years ago. Mild esophagitis at that time. Incidental benign gastric polyps as well. Blood work from November 2018 unremarkable including liver tests. Abdominal ultrasound with gallbladder polyps but no stones.  REVIEW OF SYSTEMS:  All non-GI ROS negative except for arthritis, back pain, hearing problems, heart murmur, depression, muscle cramps, sleeping problems, excessive thirst, urinary leakage  Past Medical History:  Diagnosis Date  . Anxiety   . Arthritis   . Carpal tunnel syndrome, bilateral 07/31/2015  . Cervical spondylosis without myelopathy   . Chronic chest pain   . Chronic kidney disease   . Chronic low back pain   . Chronotropic incompetence   . CKD (chronic kidney disease), stage III ([place])   . Coronary artery disease    a. inf-post MI 2011 s/p DES to RCA. b. DES to RCA 06/2010. c. DES to LAD 2014, residual dz treated medically.  . Depression   . Esophageal stricture   . GERD (gastroesophageal reflux disease)   . Gross hematuria   . History of esophageal dilatation    FOR STRIUCTURE  . History of gout   . History of hiatal hernia   . History of kidney stones   . Hyperlipidemia   . Hypertension   . Hypothyroidism   . Internal hemorrhoids   . Irritable bowel syndrome   . Obesity   . Orthostatic hypotension   . Partial seizure disorder (HCC) NEUROLOGIST-- DR WILLIS   NOCTURNAL  . PONV (postoperative nausea and vomiting)    hard time getting iv site-had to do neck stick 2 yrs ago  . Pulmonary fibrosis ([place])   . S/P pericardial cyst excision    02-05-2013  benign  . Seizures ([place])    none in yrs  . SVT (supraventricular tachycardia) ([place])   . Trigger finger, acquired 09/30/2015   Right middle finger  . Vitamin D deficiency     Past Surgical History:  Procedure Laterality Date  . BIOPSY OF MEDIASTINAL MASS N/A 02/05/2013   Procedure: RESECTION OF MEDIASTINAL MASS;  Surgeon: Ivin Poot, MD;  Location: [place];  Service: Thoracic;  Laterality: N/A;  . CARDIAC CATHETERIZATION Right 01/24/2015   Procedure: right femoral ARTERIAL LINE INSERTION;  Surgeon: Ivin Poot, MD;  Location: [place];  Service: Thoracic;  Laterality: Right;  . CARDIOVASCULAR STRESS TEST  11-22-2013  dr Irish Lack   normal lexiscan study/  no ischemia/  normal LVF and wall motion/ ef 81%  . COLONOSCOPY  last one 2014  . CORONARY ANGIOGRAM  03/10/2011   Procedure: CORONARY ANGIOGRAM;  Surgeon: Jettie Booze, MD;  Location: [facility] CATH LAB;  Service: Cardiovascular;;  . CORONARY ANGIOPLASTY WITH STENT PLACEMENT  11-22-2009  dr Irish Lack   Acute inferoposterior MI/  ef 60%,  PCI with DES x1 to dRCA,  25%  mLAD  . CORONARY ANGIOPLASTY WITH STENT PLACEMENT  06-26-2010  dr Daneen Schick   Cutting balloon angioplasty to dRCA with DES  x1,  40% mLAD (non-obstructive CAD)  . CYSTOSCOPY WITH RETROGRADE PYELOGRAM, URETEROSCOPY AND STENT PLACEMENT Right 02/13/2014   Procedure: CYSTOSCOPY WITH RETROGRADE PYELOGRAM, URETEROSCOPY AND STENT PLACEMENT;  Surgeon: Bernestine Amass, MD;  Location[facility];  Service: Urology;  Laterality: Right;  . ECTOPIC PREGNANCY SURGERY  YRS AGO   SALPINGECTOMY  . ESOPHAGOGASTRODUODENOSCOPY N/A 02/15/2014   Procedure: ESOPHAGOGASTRODUODENOSCOPY (EGD);  Surgeon: Jerene Bears, MD;  Location: [facility];  Service: Endoscopy;  Laterality: N/A;  . ESOPHAGOGASTRODUODENOSCOPY (EGD) WITH ESOPHAGEAL DILATION  05-20-2011  . HOLMIUM LASER APPLICATION Right 09/15/7094   Procedure: HOLMIUM LASER APPLICATION;  Surgeon: Bernestine Amass, MD;  Location: [facility];  Service: Urology;  Laterality: Right;  . LEFT HEART CATHETERIZATION WITH CORONARY ANGIOGRAM N/A 03/14/2012   Procedure: LEFT HEART CATHETERIZATION WITH CORONARY ANGIOGRAM;  Surgeon: Sueanne Margarita, MD;  Location: [place] CATH LAB;  Service: Cardiovascular;  Laterality: N/A;  Normal LM,  50% pLAD,  70% mLAD,  D2 50-70%, very tortuous LAD,  70-82% ostial LCFX,  50% in-stent restenosis of  dRCA and mRCA  stent and 50-70% ostial PDA,  normal LVSF, ef 60%  . LUNG BIOPSY Right 01/24/2015   Procedure: RIGHT LUNG BIOPSY;  Surgeon: Ivin Poot, MD;  Location: [place];  Service: Thoracic;  Laterality: Right;  . MEDIASTERNOTOMY N/A 02/05/2013   Procedure: MEDIAN STERNOTOMY;  Surgeon: Ivin Poot, MD;  Location: [place];  Service: Thoracic;  Laterality: N/A;  . NEEDLE GUIDED EXCISION BREAST CALCIFICATIONS Right 08-09-2008  . PERCUTANEOUS CORONARY STENT INTERVENTION (PCI-S) N/A 04/03/2012   Procedure: PERCUTANEOUS CORONARY STENT INTERVENTION (PCI-S);  Surgeon: Jettie Booze, MD;  Location: [facility] CATH LAB;  Service: Cardiovascular;  Laterality: N/A;   Successful PCI  mLAD with 2.75x12 Promus stent, postdilated to >0.23mm  . THORACOTOMY Right  01/24/2015   Procedure: RIGHT MINI/LIMITED THORACOTOMY;  Surgeon: Ivin Poot, MD;  Location: [place];  Service: Thoracic;  Laterality: Right;  . TRANSTHORACIC ECHOCARDIOGRAM  11-17-2011   grade I diastolic dysfunction/  ef 55-60%  . VIDEO BRONCHOSCOPY N/A 01/24/2015   Procedure: RIGHT VIDEO BRONCHOSCOPY;  Surgeon: Ivin Poot, MD;  Location: [facility] OR;  Service: Thoracic;  Laterality: N/A;    Social History Shefali JLYNN LY  reports that she quit smoking about 49 years ago. Her smoking use included cigarettes. She started smoking about 52 years ago. She has a 0.90 pack-year smoking history. she has never used smokeless tobacco. She reports that she does not drink alcohol or use drugs.  family history includes Breast cancer in her sister and unknown relative; CVA in her mother; Coronary artery disease in her father; Diabetes Mellitus I in her father; Emphysema in her father; Heart attack in her mother; Heart disease in her father; Hypertension in her father.  No Active Allergies     PHYSICAL EXAMINATION: Vital signs: BP 92/60   Pulse 80   Ht 4\' 11"  (1.499 m)  Wt 149 lb 12.8 oz (67.9 kg)   BMI 30.26 kg/m   Constitutional: Pleasant, obese, generally well-appearing, no acute distress Psychiatric: alert and oriented x3, cooperative Eyes: extraocular movements intact, anicteric, conjunctiva pink Mouth: oral pharynx moist, no lesions Neck: supple no lymphadenopathy Cardiovascular: heart regular rate and rhythm, no murmur Lungs: clear to auscultation bilaterally Abdomen: soft, obese, nontender, nondistended, no obvious ascites, no peritoneal signs, normal bowel sounds, no organomegaly Rectal: Omitted Extremities: no clubbing, cyanosis, or lower extremity edema bilaterally Skin: no lesions on visible extremities Neuro: No focal deficits. Cranial nerves intact  ASSESSMENT:  #1. Atypical chest and epigastric discomfort. Worsened in recent months. Somewhat better in recent weeks. Etiology  clear. Suspect musculoskeletal or functional #2. GERD. Classic symptoms controlled with PPI #3. Dysphagia. A combination of dysmotility and esophageal ring. Not particularly problematic at present #4. Colonoscopy with ileal intubation December 2011. Normal   PLAN:  #1. Reflux precautions #2. Continue PPI #3. Diagnostic upper endoscopy. Patient is high-risk given her comorbidities and her antiplatelet therapy. I have recommended that she stay on Plavix for her procedure as well as aspirin.The nature of the procedure, as well as the risks, benefits, and alternatives were carefully and thoroughly reviewed with the patient. Ample time for discussion and questions allowed. The patient understood, was satisfied, and agreed to proceed. #4. Repeat screening colonoscopy due around December 2021

## 2017-01-25 ENCOUNTER — Encounter (HOSPITAL_COMMUNITY): Payer: Self-pay | Admitting: *Deleted

## 2017-01-27 ENCOUNTER — Other Ambulatory Visit: Payer: Self-pay

## 2017-01-27 ENCOUNTER — Encounter: Payer: Self-pay | Admitting: Internal Medicine

## 2017-01-27 ENCOUNTER — Ambulatory Visit (AMBULATORY_SURGERY_CENTER): Payer: PPO | Admitting: Internal Medicine

## 2017-01-27 VITALS — BP 111/57 | HR 63 | Temp 97.5°F | Resp 12 | Ht 59.0 in | Wt 149.0 lb

## 2017-01-27 DIAGNOSIS — R569 Unspecified convulsions: Secondary | ICD-10-CM | POA: Diagnosis not present

## 2017-01-27 DIAGNOSIS — R1013 Epigastric pain: Secondary | ICD-10-CM

## 2017-01-27 DIAGNOSIS — K219 Gastro-esophageal reflux disease without esophagitis: Secondary | ICD-10-CM

## 2017-01-27 DIAGNOSIS — R079 Chest pain, unspecified: Secondary | ICD-10-CM | POA: Diagnosis not present

## 2017-01-27 DIAGNOSIS — R55 Syncope and collapse: Secondary | ICD-10-CM | POA: Diagnosis not present

## 2017-01-27 DIAGNOSIS — I10 Essential (primary) hypertension: Secondary | ICD-10-CM | POA: Diagnosis not present

## 2017-01-27 DIAGNOSIS — I2510 Atherosclerotic heart disease of native coronary artery without angina pectoris: Secondary | ICD-10-CM | POA: Diagnosis not present

## 2017-01-27 MED ORDER — SODIUM CHLORIDE 0.9 % IV SOLN: 500.0000 mL | INTRAVENOUS | Status: DC

## 2017-01-27 NOTE — Progress Notes (Signed)
Report to PACU, RN, vss, BBS= Clear.  

## 2017-01-27 NOTE — Patient Instructions (Signed)
YOU HAD AN ENDOSCOPIC PROCEDURE TODAY AT THE [facility]:   Refer to the procedure report that was given to you for any specific questions about what was found during the examination.  If the procedure report does not answer your questions, please call your gastroenterologist to clarify.  If you requested that your care partner not be given the details of your procedure findings, then the procedure report has been included in a sealed envelope for you to review at your convenience later.  YOU SHOULD EXPECT: Some feelings of bloating in the abdomen. Passage of more gas than usual.  Walking can help get rid of the air that was put into your GI tract during the procedure and reduce the bloating. If you had a lower endoscopy (such as a colonoscopy or flexible sigmoidoscopy) you may notice spotting of blood in your stool or on the toilet paper. If you underwent a bowel prep for your procedure, you may not have a normal bowel movement for a few days.  Please Note:  You might notice some irritation and congestion in your nose or some drainage.  This is from the oxygen used during your procedure.  There is no need for concern and it should clear up in a day or so.  SYMPTOMS TO REPORT IMMEDIATELY:   Following upper endoscopy (EGD)  Vomiting of blood or coffee ground material  New chest pain or pain under the shoulder blades  Painful or persistently difficult swallowing  New shortness of breath  Fever of 100F or higher  Black, tarry-looking stools  For urgent or emergent issues, a gastroenterologist can be reached at any hour by calling [phone].   DIET:  We do recommend a small meal at first, but then you may proceed to your regular diet.  Drink plenty of fluids but you should avoid alcoholic beverages for 24 hours.  ACTIVITY:  You should plan to take it easy for the rest of today and you should NOT DRIVE or use heavy machinery until tomorrow (because of the sedation medicines used  during the test).    FOLLOW UP: Our staff will call the number listed on your records the next business day following your procedure to check on you and address any questions or concerns that you may have regarding the information given to you following your procedure. If we do not reach you, we will leave a message.  However, if you are feeling well and you are not experiencing any problems, there is no need to return our call.  We will assume that you have returned to your regular daily activities without incident.  If any biopsies were taken you will be contacted by phone or by letter within the next 1-3 weeks.  Please call us at [phone] if you have not heard about the biopsies in 3 weeks.    SIGNATURES/CONFIDENTIALITY: You and/or your care partner have signed paperwork which will be entered into your electronic medical record.  These signatures attest to the fact that that the information above on your After Visit Summary has been reviewed and is understood.  Full responsibility of the confidentiality of this discharge information lies with you and/or your care-partner. 

## 2017-01-27 NOTE — Op Note (Signed)
[place] Patient Name: [name] Procedure Date: 01/27/2017 2:42 PM MRN: [id number] Endoscopist: Docia Chuck. Henrene Pastor , MD Age: 72 Referring MD:  Date of Birth: [birth year] Gender: Female Account #: [id number] Procedure:                Upper GI endoscopy Indications:              Epigastric abdominal pain, Chest pain (non cardiac) Medicines:                Monitored Anesthesia Care Procedure:                Pre-Anesthesia Assessment:                           - Prior to the procedure, a History and Physical                            was performed, and patient medications and                            allergies were reviewed. The patient's tolerance of                            previous anesthesia was also reviewed. The risks                            and benefits of the procedure and the sedation                            options and risks were discussed with the patient.                            All questions were answered, and informed consent                            was obtained. Prior Anticoagulants: The patient has                            taken Plavix (clopidogrel), last dose was day of                            procedure. ASA Grade Assessment: III - A patient                            with severe systemic disease. After reviewing the                            risks and benefits, the patient was deemed in                            satisfactory condition to undergo the procedure.                           After obtaining informed consent, the endoscope was  passed under direct vision. Throughout the                            procedure, the patient's blood pressure, pulse, and                            oxygen saturations were monitored continuously. The                            Endoscope was introduced through the mouth, and                            advanced to the second part of duodenum. The upper   GI endoscopy was accomplished without difficulty.                            The patient tolerated the procedure well. Scope In: Scope Out: Findings:                 The esophagus was normal.                           The stomach revealed multiple incidental                            benign-appearing fundic gland polyps. There was a                            small sliding hiatal hernia. The stomach otherwise                            was normal.                           The examined duodenum was normal.                           The cardia and gastric fundus were normal on                            retroflexion. Complications:            No immediate complications. Estimated Blood Loss:     Estimated blood loss: none. Impression:               - Normal esophagus.                           - Normal stomach, save incidental polyps.                           - Normal examined duodenum.                           - No specimens collected. Recommendation:           - Patient has a contact number available for  emergencies. The signs and symptoms of potential                            delayed complications were discussed with the                            patient. Return to normal activities tomorrow.                            Written discharge instructions were provided to the                            patient.                           - Resume previous diet.                           - Continue present medications.                           - GI follow-up as needed Zacharius Funari N. Henrene Pastor, MD 01/27/2017 2:53:58 PM This report has been signed electronically.

## 2017-01-28 ENCOUNTER — Telehealth: Payer: Self-pay

## 2017-01-28 NOTE — Telephone Encounter (Signed)
  Follow up Call-  Call back number 01/27/2017  Post procedure Call Back phone  # [phone]  Permission to leave phone message Yes  Some recent data might be hidden     Patient questions:  Do you have a fever, pain , or abdominal swelling? No. Pain Score  0 *  Have you tolerated food without any problems? Yes.    Have you been able to return to your normal activities? Yes.    Do you have any questions about your discharge instructions: Diet   No. Medications  No. Follow up visit  No.  Do you have questions or concerns about your Care? No.  Actions: * If pain score is 4 or above: No action needed, pain <4.

## 2017-01-30 ENCOUNTER — Other Ambulatory Visit: Payer: Self-pay | Admitting: Cardiology

## 2017-02-07 ENCOUNTER — Other Ambulatory Visit: Payer: Self-pay | Admitting: Cardiology

## 2017-02-10 ENCOUNTER — Other Ambulatory Visit: Payer: Self-pay | Admitting: Cardiology

## 2017-02-14 ENCOUNTER — Other Ambulatory Visit: Payer: Self-pay | Admitting: Cardiology

## 2017-02-14 MED ORDER — NITROGLYCERIN 0.4 MG SL SUBL: SUBLINGUAL_TABLET | SUBLINGUAL | 1 refills | Status: DC

## 2017-02-20 ENCOUNTER — Other Ambulatory Visit: Payer: Self-pay | Admitting: Cardiology

## 2017-02-21 NOTE — Telephone Encounter (Signed)
New Message    Going on vacation and need new prescription to cover til she gets back    *STAT* If patient is at the pharmacy, call can be transferred to refill team.   1. Which medications need to be refilled? (please list name of each medication and dose if known)  furosemide (LASIX) 40 MG tablet Take 1 tablet (40 mg total) by mouth every other day.     2. Which pharmacy/location (including street and city if local pharmacy) is [facility] [address]   3. Do they need a 30 day or 90 day supply?[place]

## 2017-02-23 ENCOUNTER — Telehealth: Payer: Self-pay | Admitting: Cardiology

## 2017-02-23 DIAGNOSIS — M109 Gout, unspecified: Secondary | ICD-10-CM | POA: Diagnosis not present

## 2017-02-23 DIAGNOSIS — N183 Chronic kidney disease, stage 3 (moderate): Secondary | ICD-10-CM | POA: Diagnosis not present

## 2017-02-23 DIAGNOSIS — F331 Major depressive disorder, recurrent, moderate: Secondary | ICD-10-CM | POA: Diagnosis not present

## 2017-02-23 DIAGNOSIS — I10 Essential (primary) hypertension: Secondary | ICD-10-CM | POA: Diagnosis not present

## 2017-02-23 DIAGNOSIS — I499 Cardiac arrhythmia, unspecified: Secondary | ICD-10-CM | POA: Diagnosis not present

## 2017-02-23 DIAGNOSIS — E7800 Pure hypercholesterolemia, unspecified: Secondary | ICD-10-CM | POA: Diagnosis not present

## 2017-02-23 DIAGNOSIS — K219 Gastro-esophageal reflux disease without esophagitis: Secondary | ICD-10-CM | POA: Diagnosis not present

## 2017-02-23 DIAGNOSIS — G4700 Insomnia, unspecified: Secondary | ICD-10-CM | POA: Diagnosis not present

## 2017-02-23 DIAGNOSIS — I25119 Atherosclerotic heart disease of native coronary artery with unspecified angina pectoris: Secondary | ICD-10-CM | POA: Diagnosis not present

## 2017-02-23 NOTE — Telephone Encounter (Signed)
EKG requested From [facility]. Once received will forward to [facility].

## 2017-02-23 NOTE — Telephone Encounter (Signed)
New Message  Patient c/o Palpitations:  High priority if patient c/o lightheadedness, shortness of breath, or chest pain  1) How long have you had palpitations/irregular HR/ Afib? Are you having the symptoms now? none  2) Are you currently experiencing lightheadedness, SOB or CP? none  3) Do you have a history of afib (atrial fibrillation) or irregular heart rhythm? yes  4) Have you checked your BP or HR? (document readings if available): no   5) Are you experiencing any other symptoms? No    Patient says she went to her PCP today that did a EKG and discovered irregular heart rhythm. She was advised to make an appoint to see Dr. Radford Pax which did. She has not noticed any irregular heartbeats but would like to discuss.

## 2017-02-23 NOTE — Telephone Encounter (Signed)
PCP heard irregular rhythm, but did ekg and said that everything is ok but recommended she make follow up appointment with Dr. Moishe Spice per patient when I called her back.  She did not request to speak with triage nurse and is feeling fine, does not wish to move the appointment up with Dr. Radford Pax from 04/18/17.  Advised I would request copy of EKG from [facility] for Dr. Radford Pax to review and if any concerns at that time we will call pt back to make sooner appointment.  Pt is in agreement with this plan.  Message sent to medical records to obtain copy of today's EKG.  Will route to Dr. Theodosia Blender nurse to follow.

## 2017-03-15 DIAGNOSIS — L814 Other melanin hyperpigmentation: Secondary | ICD-10-CM | POA: Diagnosis not present

## 2017-03-15 DIAGNOSIS — D2262 Melanocytic nevi of left upper limb, including shoulder: Secondary | ICD-10-CM | POA: Diagnosis not present

## 2017-03-15 DIAGNOSIS — L918 Other hypertrophic disorders of the skin: Secondary | ICD-10-CM | POA: Diagnosis not present

## 2017-03-15 DIAGNOSIS — D2261 Melanocytic nevi of right upper limb, including shoulder: Secondary | ICD-10-CM | POA: Diagnosis not present

## 2017-03-15 DIAGNOSIS — D2271 Melanocytic nevi of right lower limb, including hip: Secondary | ICD-10-CM | POA: Diagnosis not present

## 2017-03-15 DIAGNOSIS — D224 Melanocytic nevi of scalp and neck: Secondary | ICD-10-CM | POA: Diagnosis not present

## 2017-03-15 DIAGNOSIS — D485 Neoplasm of uncertain behavior of skin: Secondary | ICD-10-CM | POA: Diagnosis not present

## 2017-03-15 DIAGNOSIS — D2221 Melanocytic nevi of right ear and external auricular canal: Secondary | ICD-10-CM | POA: Diagnosis not present

## 2017-03-15 DIAGNOSIS — L720 Epidermal cyst: Secondary | ICD-10-CM | POA: Diagnosis not present

## 2017-03-15 DIAGNOSIS — D2272 Melanocytic nevi of left lower limb, including hip: Secondary | ICD-10-CM | POA: Diagnosis not present

## 2017-03-15 DIAGNOSIS — D225 Melanocytic nevi of trunk: Secondary | ICD-10-CM | POA: Diagnosis not present

## 2017-03-15 DIAGNOSIS — L821 Other seborrheic keratosis: Secondary | ICD-10-CM | POA: Diagnosis not present

## 2017-03-22 DIAGNOSIS — L988 Other specified disorders of the skin and subcutaneous tissue: Secondary | ICD-10-CM | POA: Diagnosis not present

## 2017-03-22 DIAGNOSIS — D485 Neoplasm of uncertain behavior of skin: Secondary | ICD-10-CM | POA: Diagnosis not present

## 2017-04-01 DIAGNOSIS — Z4802 Encounter for removal of sutures: Secondary | ICD-10-CM | POA: Diagnosis not present

## 2017-04-04 ENCOUNTER — Other Ambulatory Visit: Payer: Self-pay | Admitting: Internal Medicine

## 2017-04-04 ENCOUNTER — Other Ambulatory Visit: Payer: Self-pay | Admitting: Cardiology

## 2017-04-05 NOTE — Telephone Encounter (Signed)
°*  STAT* If patient is at the pharmacy, call can be transferred to refill team.   1. Which medications need to be refilled? (please list name of each medication and dose if known)pt says she see Dr Radford Pax now-so she think she will need a prescription for her Diltiazem  2. Which pharmacy/location (including street and city if local pharmacy) is medication to be sent to?[facility]  3. Do they need a 30 day or 90 day supply? 90 and refills

## 2017-04-06 DIAGNOSIS — G894 Chronic pain syndrome: Secondary | ICD-10-CM | POA: Insufficient documentation

## 2017-04-06 DIAGNOSIS — M545 Low back pain: Secondary | ICD-10-CM

## 2017-04-06 DIAGNOSIS — G8929 Other chronic pain: Secondary | ICD-10-CM | POA: Insufficient documentation

## 2017-04-06 DIAGNOSIS — Z79891 Long term (current) use of opiate analgesic: Secondary | ICD-10-CM | POA: Diagnosis not present

## 2017-04-06 DIAGNOSIS — M542 Cervicalgia: Secondary | ICD-10-CM

## 2017-04-11 ENCOUNTER — Other Ambulatory Visit: Payer: Self-pay | Admitting: Physician Assistant

## 2017-04-11 ENCOUNTER — Other Ambulatory Visit: Payer: Self-pay | Admitting: Internal Medicine

## 2017-04-17 ENCOUNTER — Other Ambulatory Visit: Payer: Self-pay | Admitting: Cardiology

## 2017-04-18 ENCOUNTER — Ambulatory Visit: Payer: PPO | Admitting: Cardiology

## 2017-04-18 ENCOUNTER — Encounter: Payer: Self-pay | Admitting: Cardiology

## 2017-04-18 VITALS — BP 116/72 | HR 61 | Ht 59.0 in | Wt 150.8 lb

## 2017-04-18 DIAGNOSIS — I2510 Atherosclerotic heart disease of native coronary artery without angina pectoris: Secondary | ICD-10-CM | POA: Diagnosis not present

## 2017-04-18 DIAGNOSIS — I350 Nonrheumatic aortic (valve) stenosis: Secondary | ICD-10-CM | POA: Diagnosis not present

## 2017-04-18 DIAGNOSIS — I10 Essential (primary) hypertension: Secondary | ICD-10-CM | POA: Diagnosis not present

## 2017-04-18 DIAGNOSIS — I951 Orthostatic hypotension: Secondary | ICD-10-CM

## 2017-04-18 DIAGNOSIS — Q248 Other specified congenital malformations of heart: Secondary | ICD-10-CM

## 2017-04-18 DIAGNOSIS — I471 Supraventricular tachycardia: Secondary | ICD-10-CM | POA: Diagnosis not present

## 2017-04-18 DIAGNOSIS — E7800 Pure hypercholesterolemia, unspecified: Secondary | ICD-10-CM

## 2017-04-18 NOTE — Progress Notes (Addendum)
Cardiology Office Note:    Date:  04/18/2017   ID:  [name], DOB [birth year], MRN [id number]  PCP:  Jasmine Ada, MD  Cardiologist:  No primary care provider on file.    Referring MD: Jasmine Ada, MD   Chief Complaint  Patient presents with  . Coronary Artery Disease  . Hypertension  . Hyperlipidemia    History of Present Illness:    [name] is a 72 y.o. female with a hx of CAD/HTN, pericardial cyst s/p resection and dyslipidemia who presents today for followup. She has a history of chronic CP with esophageal strictures and has had esophageal dilatation.She underwent VATS procedure via mini thoracotomy syndrome with open lung bx and was diagnosed with interstitial lung disease. She has chronic CP that has been felt to be noncardiac in the past and has started to reoccur. She has chronic DOE.   She underwent a cardiopulmonary stress test by Pulmonary to evaluate her SOB and it showed deconditioning and poor HR response to exercise. At the request of the pulmonologist her CCB and BB were stoppedas they though her SOB was due to chronotropic incompetence.She then started having palpitations and was seen byher PCP,Jasmine. [name] was told to take bystolic PRN for palpitations. She started having more palpitations and was told to take bystolic daily instead of PRN. She then saw Jasmine. [name] who put her back on Cardizem and dropped her amlodipine to 5mg  daily.   A nuclear stress test was done in January 2018 which showed no ischemia.  This was done for similar chest pain that she had in the past.  This chest pain occurred around the time she lost her dog.  She is here today for followup and is doing well.  She denies any anginal chest pain or pressure,  PND, orthopnea, LE edema, palpitations or syncope. She has chronic DOE but this has significantly improved in the past year.  She occasionally has some dizziness if she stands up too fast.  She says that she has not had  any of her chronic CP recently.  She is compliant with her meds and is tolerating meds with no SE.    Past Medical History:  Diagnosis Date  . Anxiety   . Arthritis   . Carpal tunnel syndrome, bilateral 07/31/2015  . Cataract   . Cervical spondylosis without myelopathy   . Chronic chest pain   . Chronic kidney disease   . Chronic low back pain   . Chronotropic incompetence   . CKD (chronic kidney disease), stage III ([place])   . Coronary artery disease    a. inf-post MI 2011 s/p DES to RCA. b. DES to RCA 06/2010. c. DES to LAD 2014, residual dz treated medically.  . Depression   . Esophageal stricture   . GERD (gastroesophageal reflux disease)   . Gross hematuria   . History of esophageal dilatation    FOR STRIUCTURE  . History of gout   . History of hiatal hernia   . History of kidney stones   . Hyperlipidemia   . Hypertension   . Hypothyroidism   . Internal hemorrhoids   . Irritable bowel syndrome   . Mild aortic stenosis    by echo 2018  . Obesity   . Orthostatic hypotension   . Partial seizure disorder (HCC) NEUROLOGIST-- [name]   NOCTURNAL  . PONV (postoperative nausea and vomiting)    hard time getting iv site-had to do neck stick 2 yrs ago  .  Pulmonary fibrosis ([place])   . S/P pericardial cyst excision    02-05-2013  benign  . Seizures ([place])    none in yrs  . SVT (supraventricular tachycardia) ([place])   . Trigger finger, acquired 09/30/2015   Right middle finger  . Vitamin D deficiency     Past Surgical History:  Procedure Laterality Date  . BIOPSY OF MEDIASTINAL MASS N/A 02/05/2013   Procedure: RESECTION OF MEDIASTINAL MASS;  Surgeon: Ivin Poot, MD;  Location: [facility];  Service: Thoracic;  Laterality: N/A;  . CARDIAC CATHETERIZATION Right 01/24/2015   Procedure: right femoral ARTERIAL LINE INSERTION;  Surgeon: Ivin Poot, MD;  Location: [facility];  Service: Thoracic;  Laterality: Right;  . CARDIOVASCULAR STRESS TEST  11-22-2013  Jasmine Irish Lack   normal  lexiscan study/  no ischemia/  normal LVF and wall motion/ ef 81%  . COLONOSCOPY  last one 2014  . CORONARY ANGIOGRAM  03/10/2011   Procedure: CORONARY ANGIOGRAM;  Surgeon: Jettie Booze, MD;  Location: [facility] CATH LAB;  Service: Cardiovascular;;  . CORONARY ANGIOPLASTY WITH STENT PLACEMENT  11-22-2009  Jasmine Irish Lack   Acute inferoposterior MI/  ef 60%,  PCI with DES x1 to dRCA,  25%  mLAD  . CORONARY ANGIOPLASTY WITH STENT PLACEMENT  06-26-2010  Jasmine Daneen Schick   Cutting balloon angioplasty to dRCA with DES x1,  40% mLAD (non-obstructive CAD)  . CYSTOSCOPY WITH RETROGRADE PYELOGRAM, URETEROSCOPY AND STENT PLACEMENT Right 02/13/2014   Procedure: CYSTOSCOPY WITH RETROGRADE PYELOGRAM, URETEROSCOPY AND STENT PLACEMENT;  Surgeon: Bernestine Amass, MD;  Location: [facility];  Service: Urology;  Laterality: Right;  . ECTOPIC PREGNANCY SURGERY  YRS AGO   SALPINGECTOMY  . ESOPHAGOGASTRODUODENOSCOPY N/A 02/15/2014   Procedure: ESOPHAGOGASTRODUODENOSCOPY (EGD);  Surgeon: Jerene Bears, MD;  Location: [facility];  Service: Endoscopy;  Laterality: N/A;  . ESOPHAGOGASTRODUODENOSCOPY (EGD) WITH ESOPHAGEAL DILATION  05-20-2011  . HOLMIUM LASER APPLICATION Right 06/13/7856   Procedure: HOLMIUM LASER APPLICATION;  Surgeon: Bernestine Amass, MD;  Location: [facility];  Service: Urology;  Laterality: Right;  . LEFT HEART CATHETERIZATION WITH CORONARY ANGIOGRAM N/A 03/14/2012   Procedure: LEFT HEART CATHETERIZATION WITH CORONARY ANGIOGRAM;  Surgeon: Sueanne Margarita, MD;  Location: [facility] CATH LAB;  Service: Cardiovascular;  Laterality: N/A;  Normal LM,  50% pLAD,  70% mLAD,  D2 50-70%, very tortuous LAD,  70-82% ostial LCFX,  50% in-stent restenosis of  dRCA and mRCA  stent and 50-70% ostial PDA,  normal LVSF, ef 60%  . LUNG BIOPSY Right 01/24/2015   Procedure: RIGHT LUNG BIOPSY;  Surgeon: Ivin Poot, MD;  Location: [facility];  Service: Thoracic;  Laterality: Right;  . MEDIASTERNOTOMY N/A 02/05/2013    Procedure: MEDIAN STERNOTOMY;  Surgeon: Ivin Poot, MD;  Location: [facility];  Service: Thoracic;  Laterality: N/A;  . MEDIASTERNOTOMY Left 02/05/2013  . NEEDLE GUIDED EXCISION BREAST CALCIFICATIONS Right 08-09-2008  . PERCUTANEOUS CORONARY STENT INTERVENTION (PCI-S) N/A 04/03/2012   Procedure: PERCUTANEOUS CORONARY STENT INTERVENTION (PCI-S);  Surgeon: Jettie Booze, MD;  Location: [facility] CATH LAB;  Service: Cardiovascular;  Laterality: N/A;   Successful PCI  mLAD with 2.75x12 Promus stent, postdilated to >0.29mm  . THORACOTOMY Right 01/24/2015   Procedure: RIGHT MINI/LIMITED THORACOTOMY;  Surgeon: Ivin Poot, MD;  Location: [facility];  Service: Thoracic;  Laterality: Right;  . TRANSTHORACIC ECHOCARDIOGRAM  11-17-2011   grade I diastolic dysfunction/  ef 55-60%  . VIDEO BRONCHOSCOPY N/A 01/24/2015   Procedure: RIGHT  VIDEO BRONCHOSCOPY;  Surgeon: Ivin Poot, MD;  Location: [facility] OR;  Service: Thoracic;  Laterality: N/A;    Current Medications: Current Meds  Medication Sig  . ALPRAZolam (XANAX) 0.25 MG tablet Take 0.25 mg by mouth at bedtime.  Marland Kitchen amLODipine (NORVASC) 5 MG tablet Take 1 tablet (5 mg total) by mouth daily.  Marland Kitchen aspirin EC 81 MG tablet Take 81 mg by mouth daily.  . calcitRIOL (ROCALTROL) 0.5 MCG capsule Take 0.5 mcg by mouth daily.  . clopidogrel (PLAVIX) 75 MG tablet TAKE 1 TABLET (75 MG TOTAL) BY MOUTH DAILY WITH BREAKFAST.  Marland Kitchen diltiazem (CARDIZEM CD) 240 MG 24 hr capsule Take 1 capsule (240 mg total) by mouth daily.  Marland Kitchen doxazosin (CARDURA) 2 MG tablet TAKE 1 TABLET (2 MG TOTAL) BY MOUTH DAILY.  . DULoxetine (CYMBALTA) 20 MG capsule Take 20 mg by mouth daily.  . ferrous sulfate 325 (65 FE) MG tablet Take 325 mg by mouth daily with breakfast.   . furosemide (LASIX) 40 MG tablet TAKE 1 TABLET BY MOUTH EVERY OTHER DAY  . HYDROcodone-acetaminophen (NORCO) 10-325 MG tablet Take 1 tablet by mouth every 6 (six) hours as needed.  . isosorbide mononitrate (IMDUR) 60 MG 24 hr tablet TAKE  1 AND 1/2 TABLETS BY MOUTH EVERY DAY  . levETIRAcetam (KEPPRA) 500 MG tablet Take 1 tablet (500 mg total) by mouth 2 (two) times daily.  Marland Kitchen levothyroxine (SYNTHROID, LEVOTHROID) 112 MCG tablet Take 112 mcg by mouth daily.  Marland Kitchen losartan (COZAAR) 50 MG tablet Take 50 mg daily by mouth.  . magnesium oxide (MAG-OX) 400 MG tablet Take 1 tablet (400 mg total) by mouth 2 (two) times daily.  . nebivolol (BYSTOLIC) 5 MG tablet Take 1 tablet (5 mg total) by mouth daily.  . nitroGLYCERIN (NITROSTAT) 0.4 MG SL tablet PLACE 1 TABLET (0.4 MG TOTAL) UNDER THE TONGUE EVERY 5 (FIVE) MINUTES AS NEEDED FOR CHEST PAIN.  Marland Kitchen pantoprazole (PROTONIX) 40 MG tablet TAKE 1 TABLET (40 MG TOTAL) BY MOUTH 2 (TWO) TIMES DAILY.  Marland Kitchen potassium chloride SA (K-DUR,KLOR-CON) 20 MEQ tablet Take 40 mEq by mouth every other day.  . rosuvastatin (CRESTOR) 20 MG tablet TAKE 1 TABLET (20 MG TOTAL) BY MOUTH EVERY MORNING.     Allergies:   Patient has no active allergies.   Social History   Socioeconomic History  . Marital status: Married    Spouse name: [name]  . Number of children: 0  . Years of education: hs  . Highest education level: Not on file  Occupational History  . Occupation: Retired    Fish farm manager: RETIRED  Social Needs  . Financial resource strain: Not on file  . Food insecurity:    Worry: Not on file    Inability: Not on file  . Transportation needs:    Medical: Not on file    Non-medical: Not on file  Tobacco Use  . Smoking status: Former Smoker    Packs/day: 0.30    Years: 3.00    Pack years: 0.90    Types: Cigarettes    Start date: 11/08/1964    Last attempt to quit: 11/09/1967    Years since quitting: 49.4  . Smokeless tobacco: Never Used  Substance and Sexual Activity  . Alcohol use: No    Alcohol/week: 0.0 oz  . Drug use: No  . Sexual activity: Not on file  Lifestyle  . Physical activity:    Days per week: Not on file    Minutes per session: Not on  file  . Stress: Not on file  Relationships  .  Social connections:    Talks on phone: Not on file    Gets together: Not on file    Attends religious service: Not on file    Active member of club or organization: Not on file    Attends meetings of clubs or organizations: Not on file    Relationship status: Not on file  Other Topics Concern  . Not on file  Social History Narrative   Lives at home, married   Left-handed   Daily caffeine use: coffee.     Family History: The patient's family history includes Breast cancer in her sister and unknown relative; CVA in her mother; Coronary artery disease in her father; Diabetes Mellitus I in her father; Emphysema in her father; Heart attack in her mother; Heart disease in her father; Hypertension in her father. There is no history of Colon cancer, Stomach cancer, Esophageal cancer, or Rectal cancer.  ROS:   Please see the history of present illness.    Review of Systems  Musculoskeletal: Positive for back pain.  Psychiatric/Behavioral: The patient is nervous/anxious.     All other systems reviewed and negative.   EKGs/Labs/Other Studies Reviewed:    The following studies were reviewed today: none  EKG:  EKG is  ordered today.  The ekg ordered today demonstrates NSR with anterior infarct  Recent Labs: 11/23/2016: ALT 6   Recent Lipid Panel    Component Value Date/Time   CHOL 132 11/23/2016 1109   TRIG 56 11/23/2016 1109   HDL 70 11/23/2016 1109   CHOLHDL 1.9 11/23/2016 1109   CHOLHDL 1.8 06/06/2015 0928   VLDL 11 06/06/2015 0928   LDLCALC 51 11/23/2016 1109    Physical Exam:    VS:  BP 116/72   Pulse 61   Ht 4\' 11"  (1.499 m)   Wt 150 lb 12.8 oz (68.4 kg)   BMI 30.46 kg/m     Wt Readings from Last 3 Encounters:  04/18/17 150 lb 12.8 oz (68.4 kg)  01/27/17 149 lb (67.6 kg)  01/24/17 149 lb 12.8 oz (67.9 kg)     GEN:  Well nourished, well developed in no acute distress HEENT: Normal NECK: No JVD; No carotid bruits LYMPHATICS: No lymphadenopathy CARDIAC: RRR,  no rubs, gallops.  2/6 SM at RUSB RESPIRATORY:  Clear to auscultation without rales, wheezing or rhonchi  ABDOMEN: Soft, non-tender, non-distended MUSCULOSKELETAL:  No edema; No deformity  SKIN: Warm and dry NEUROLOGIC:  Alert and oriented x 3 PSYCHIATRIC:  Normal affect   ASSESSMENT:    1. Atherosclerosis of native coronary artery of native heart without angina pectoris   2. Essential hypertension   3. Paroxysmal SVT (supraventricular tachycardia) ([place])   4. Pericardial cyst   5. Syncope due to orthostatic hypotension   6. Pure hypercholesterolemia   7. Mild aortic stenosis    PLAN:    In order of problems listed above:  1.  ASACAD - s/p inf-post MI 2011 s/p DES to RCA, s/p DES to RCA 06/2010 and s/p DES to LAD 2014, residual dz treated medically.  She has not had any anginal sx.  She will continue on ASA 81mg  daily, Plavix 75mg  daily, Imdur 90mg  daily, Bystolic and statin.   2.  HTN - BP is well controlled on exam today. She will continue on Losartan 50mg  daily, Bystolic 5mg  daily, Doxazosin 2mg  daily, amlodipine 5mg  daily and Cardizem CD 240mg  daily.  Creatinine was stable at  1.26 on 02/23/2017.  3.  SVT - she has not had any reoccurrence of palpitations. She will continue on Bystolic and Cardizem for suppression.    4.  Pericardial cyst s/p resection  5.  Syncope due to orthostatic hypotension - this is stable and only has positional dizziness if she gets up too fast.   6.  Hyperlipidemia with LDL goal < 70.  She will continue on Crestor 20mg  daily.  Her LDL was 81 on 02/23/2017.  I encouraged her to be more compliant with low fat diet.   7.  Mild aortic stenosis - echo 1 year ago was mild.  Will repeat echo in 1-2 years.    Medication Adjustments/Labs and Tests Ordered: Current medicines are reviewed at length with the patient today.  Concerns regarding medicines are outlined above.  Orders Placed This Encounter  Procedures  . EKG 12-Lead   No orders of the defined types  were placed in this encounter.   Signed, Fransico Him, MD  04/18/2017 10:50 AM    [place]

## 2017-04-18 NOTE — Patient Instructions (Signed)
Medication Instructions:  Your physician recommends that you continue on your current medications as directed. Please refer to the Current Medication list given to you today.  If you need a refill on your cardiac medications, please contact your pharmacy first.  Labwork: None ordered   Testing/Procedures: None ordered   Follow-Up: Your physician wants you to follow-up in: 6 months with Dr. Turner. You will receive a reminder letter in the mail two months in advance. If you don't receive a letter, please call our office to schedule the follow-up appointment.  Any Other Special Instructions Will Be Listed Below (If Applicable).   Thank you for choosing [facility]    Rena Kristoph Sattler, RN  [phone]  If you need a refill on your cardiac medications before your next appointment, please call your pharmacy.   

## 2017-05-06 DIAGNOSIS — E559 Vitamin D deficiency, unspecified: Secondary | ICD-10-CM | POA: Diagnosis not present

## 2017-05-06 DIAGNOSIS — I129 Hypertensive chronic kidney disease with stage 1 through stage 4 chronic kidney disease, or unspecified chronic kidney disease: Secondary | ICD-10-CM | POA: Diagnosis not present

## 2017-05-06 DIAGNOSIS — E889 Metabolic disorder, unspecified: Secondary | ICD-10-CM | POA: Diagnosis not present

## 2017-05-06 DIAGNOSIS — N183 Chronic kidney disease, stage 3 (moderate): Secondary | ICD-10-CM | POA: Diagnosis not present

## 2017-05-06 DIAGNOSIS — M9080 Osteopathy in diseases classified elsewhere, unspecified site: Secondary | ICD-10-CM | POA: Diagnosis not present

## 2017-05-10 DIAGNOSIS — I129 Hypertensive chronic kidney disease with stage 1 through stage 4 chronic kidney disease, or unspecified chronic kidney disease: Secondary | ICD-10-CM | POA: Diagnosis not present

## 2017-05-10 DIAGNOSIS — E889 Metabolic disorder, unspecified: Secondary | ICD-10-CM | POA: Diagnosis not present

## 2017-05-10 DIAGNOSIS — N200 Calculus of kidney: Secondary | ICD-10-CM | POA: Diagnosis not present

## 2017-05-10 DIAGNOSIS — N183 Chronic kidney disease, stage 3 (moderate): Secondary | ICD-10-CM | POA: Diagnosis not present

## 2017-05-19 ENCOUNTER — Ambulatory Visit: Payer: PPO | Admitting: Internal Medicine

## 2017-05-19 ENCOUNTER — Encounter: Payer: Self-pay | Admitting: Internal Medicine

## 2017-05-19 VITALS — BP 112/62 | HR 81 | Ht 59.0 in | Wt 150.8 lb

## 2017-05-19 DIAGNOSIS — R0600 Dyspnea, unspecified: Secondary | ICD-10-CM | POA: Diagnosis not present

## 2017-05-19 DIAGNOSIS — R0689 Other abnormalities of breathing: Secondary | ICD-10-CM

## 2017-05-19 NOTE — Progress Notes (Signed)
Subjective:     Patient ID: [name], female   DOB: 11-07-1945, 72 y.o.   MRN: [id number]  HPI    OV 12/31/2015  Chief Complaint  Patient presents with  . Follow-up    Pt states her breathing is unchanged since last OV. Pt states infrequent dry cough and wheezing. Pt denies CP/tightness, f/c/s.     OV 02/17/2015  Chief Complaint  Patient presents with  . Follow-up    Pt here after lung biospy. Pt c/o right lateral chest pain from the surgery, c/o chest congestion with non prod cough. Pt states her SOB is unchanged since last OV.    72 year old obese woman referred for pulm fibrosis. Hx from patient (poor hx) and chart and husband.  Office visit 11/22/2014: Dyspnea - insidious onset x several years. Progressive steadily x few years. Severe. Brought on with ltd exertion like ADLs. Relieved by rest. No associated cough or wheeze. ECHO 10/15/14 - gr1 diast dysfn and per husband recently started on lasix without improvement. Had CTA 11/9/176 - PE ruled out but per report CT shows  ILD.  Non specific scar changes on most recent CT and new RUL nodule in 2015 and 2016.   PFT 11/18/14 shows restriction with low DLCO - suggestive of ILD - FVC 70%, TLC 69%, DLCO 52% (BMI 29.67 kg/(m^2)).   Walking desat test 185 feet x 3 laps - stopped due to dyspnea prematurely at 2 laps. Also fatigue - did not desat at 2 laps.  02/17/15: Underwent right VATS, mini thoracotomy for open lung biopsy 01/24/2015 with Dr. Prescott Gum. Path with focal mild nonspecific chronic bronchiolitis and pleuritis with granulomatous features. Dyspnea improved since surgery but not back to baseline. Cough with chest congestion but unable to bring up. Pain improved from surgery. No fevers or chills. Occasional chronic chest pain. Some wheezing. She uses 2L O2 at night.   OV 05/20/2015  Chief Complaint  Patient presents with  . Follow-up    Pt states she feels her breathing has worsened since last OV. Pt c/o slight increase in  SOB with activity, mild dry cough and has occassional chest discomfort - this resolves with rest.     Follow-up dyspnea in the setting of restrictive PFTs and ILD pattern and CT chest. Surgical lung biopsy without specific ILD diagnosis  After last visit I referred her to pulmonary habitation which she says has helped her dyspnea. She did see [facility] Dr. Lauris Chroman in April 2017 and apparently has been reassured. I reviewed his notes and he did want to review her biopsy of the multidisciplinary thoracic ILD clinic. I do not have the conclusion of this but it appears from the patient and her husband who both are poor historians that they were reassured. They were even told that she might have some emphysema. In February 2017 she had admission for nonspecific weakness. At discharge she was given some albuterol which apparently helped. She is wanting to try the same  There are no other new issues.    OV 08/25/2015  Chief Complaint  Patient presents with  . Follow-up    Pt states that she does not feel she is improving. Pt has been exercising 3 days a week. Pt c/o an increase in SOB and wheeze with exertion. Pt denies cough/cp/tightness.     Follow-up dyspnea in the setting of restrictive PFTs and nocturnal desaturation with 2016 CT suggestive of ILD surgical lung biopsy without specific diagnosis.  She returns with  her husband. In April 2017 basal ILD clinic at [facility] and reassured. Since then she is attended pulmonary rehabilitation he did she tells me this does not help. She is currently working out a [employer] and this is not helping her. She still dyspneic with exertion relieved by rest. She did try Brio inhaler that I gave her and this did not help her either. There are no other new symptoms. Review of the chart shows she has chronic anemia hemoglobin 9 g percent at least since January 2017. A year ago she was a little bit higher than that. Most recently  apparently primary care physician checked her blood hemoglobin and it was low at 9 g percent. She does not know the cause. She never seen hematology. She was blood transfusions a few years ago. Let us high-resolution CT chest August 2017 read by thoracic radiologist suggested is no ILD anymore.  ONO  - positive for desats on RA - and started on 1 L [place] after this visit  OV 12/31/2015  Chief Complaint  Patient presents with  . Follow-up    Pt states her breathing is unchanged since last OV. Pt states infrequent dry cough and wheezing. Pt denies CP/tightness, f/c/s.      Follow-up dyspnea in the setting of restrictive PFTs and nocturnal desaturation with 2016 CT suggestive of ILD surgical lung biopsy - unclassifabile mil;d ILD without specific diagnosis Jan 2017 and fu CT clear august 2017  At last visit in August 2017 she still had dyspnea with the CT chest showed no interstitial lung disease. Overnight desaturation test then showed mild hypoxemia at night. We started on 1 L oxygen which she says seems to help but she is noncompliant with this. In addition I found her to be anemic with a hemoglobin less than 9 g percent in February 2017. She then followed up with a primary care physician and renal physician who has started on iron therapy. She tells me that August 2017 hemoglobin was 9 g percent. Since then after being on iron tablets hemoglobin check with her primary care physician a few weeks ago is greater than 10 g percent according to her history. I do not have those results with him. She does me despite improvement in hemoglobin her dyspnea persists. She is unable to take a shower without resting because of dyspnea. She is unable to climb a flight of stairs but she can change her clothes. Associated with the dyspnea is exertional wheeze. This no resting wheeze is no nocturnal awakening. This no edema this no chest tightness is no fever or chills. She continues to be obese and physically  deconditioned.  Exhaled nitric oxide done today for the first time 12/31/2015 -> 6 ppff and normal [noted in the past that inhaler therapy did not help her]    in past exercise therapy did not help her.   OV 02/12/2016  Chief Complaint  Patient presents with  . Follow-up    Pt here CPST. Pt denies change in SOB and wheezing since last OV and also c/o midsternal CP, pt had a stress test with cards, per pt this was normal. Pt denies cough.   Follow-up dyspnea  Here for a pulmonary stress test done 01/26/2016 off note she did have a normal nuclear medicine cardiac stress test 02/04/2016  Pulmonary stress test shows good respiratory exchange ratio of 1.129 maximal effort. Heart aerobic capacity is significantly reduced. The reason for this appears to be chronotropic incompetence along with physical deconditioning.  Her Cardizem was changed to amlodipine but this has not helped her dyspnea. She is now telling me that in the past exercise therapy helped and she wants referral to pulmonary rehabilitation. Medication review shows that she is on both metoprolol and amlodipine. Husband says this is because of previous tachycardia. She also tells me that she does wheeze after shower but in the past exhaled nitric oxide was normal  OV 05/11/2016  Chief Complaint  Patient presents with  . Follow-up    Pt currently going to pulmonary rehab. Pt states her breathing is unchanged since last OV. Pt denies cough and f/c/s. Pt states she has occassional CP when active - resolves with rest.    Walked in to see [name] and she got call about her borther in law Althia Forts who was my ICU patient last week. Now in floor. FOundwith hemateemis and had cpr and passed away/ D/w resident Dr Jari Favre. Office visit   OV 05/19/2016  Chief Complaint  Patient presents with  . Follow-up    Pt here after pt had to leave suddenly during 05/11/2016 OV d/t a loss of family memeber. Pt is now here to cotinue the visit from  05/11/2016.     Follow-up dyspnea due to chronotropic incompetence and obesity  Last visit I referred her to pulmonary rehabilitation which she is attending but she tells me this does not help. I coordinated with cardiology and had to stop one of her cardiac medications but this made him more tachycardic. She is therefore back on beta blocker. Med review shows she is on bisoprolol and Toprol. At this point in time she is resigned to having dyspnea it is stable. She prefers to get out of pulmonary rehabilitation work out on her own at fitness. There are no new issues.  OV 05/19/2017  Chief Complaint  Patient presents with  . Follow-up    Pt states she has been doing good since last visit and denies any complaints or concerns.   [name] returns for follow-up of dyspnea due to chronotropic incompetence and obesity and physical deconditioning  It has been 1 year since I last saw her.  In the last 1 year there is been no interim change.  Her health is stable.  She says no changes in medication.  No visits to urgent care or emergency department or changes to health status.  Dyspnea persist.  She is not exercising regularly on her own.  Walking desaturation test today  Simple office walk 185 feet x  3 laps goal with forehead probe 05/19/2017   O2 used Room air  Number laps completed 3  Comments about pace nomral per CMA  Resting Pulse Ox/HR 97% and 72/min  Final Pulse Ox/HR 96% and 106/min  Desaturated </= 88% no  Desaturated <= 3% points no  Got Tachycardic >/= 90/min yes  Symptoms at end of test none  Miscellaneous comments none        has a past medical history of Anxiety, Arthritis, Carpal tunnel syndrome, bilateral (07/31/2015), Cataract, Cervical spondylosis without myelopathy, Chronic chest pain, Chronic kidney disease, Chronic low back pain, Chronotropic incompetence, CKD (chronic kidney disease), stage III ([place]), Coronary artery disease, Depression, Esophageal stricture, GERD  (gastroesophageal reflux disease), Gross hematuria, History of esophageal dilatation, History of gout, History of hiatal hernia, History of kidney stones, Hyperlipidemia, Hypertension, Hypothyroidism, Internal hemorrhoids, Irritable bowel syndrome, Mild aortic stenosis, Obesity, Orthostatic hypotension, Partial seizure disorder ([place]) (NEUROLOGIST-- DR Jannifer Franklin), PONV (postoperative nausea and vomiting), Pulmonary  fibrosis ([place]), S/P pericardial cyst excision, Seizures ([place]), SVT (supraventricular tachycardia) ([place]), Trigger finger, acquired (09/30/2015), and Vitamin D deficiency.   reports that she quit smoking about 49 years ago. Her smoking use included cigarettes. She started smoking about 52 years ago. She has a 0.90 pack-year smoking history. She has never used smokeless tobacco.  Past Surgical History:  Procedure Laterality Date  . BIOPSY OF MEDIASTINAL MASS N/A 02/05/2013   Procedure: RESECTION OF MEDIASTINAL MASS;  Surgeon: Ivin Poot, MD;  Location: [place];  Service: Thoracic;  Laterality: N/A;  . CARDIAC CATHETERIZATION Right 01/24/2015   Procedure: right femoral ARTERIAL LINE INSERTION;  Surgeon: Ivin Poot, MD;  Location: [place];  Service: Thoracic;  Laterality: Right;  . CARDIOVASCULAR STRESS TEST  11-22-2013  dr Irish Lack   normal lexiscan study/  no ischemia/  normal LVF and wall motion/ ef 81%  . COLONOSCOPY  last one 2014  . CORONARY ANGIOGRAM  03/10/2011   Procedure: CORONARY ANGIOGRAM;  Surgeon: Jettie Booze, MD;  Location: [facility] CATH LAB;  Service: Cardiovascular;;  . CORONARY ANGIOPLASTY WITH STENT PLACEMENT  11-22-2009  dr Irish Lack   Acute inferoposterior MI/  ef 60%,  PCI with DES x1 to dRCA,  25%  mLAD  . CORONARY ANGIOPLASTY WITH STENT PLACEMENT  06-26-2010  dr Daneen Schick   Cutting balloon angioplasty to dRCA with DES x1,  40% mLAD (non-obstructive CAD)  . CYSTOSCOPY WITH RETROGRADE PYELOGRAM, URETEROSCOPY AND STENT PLACEMENT Right 02/13/2014   Procedure: CYSTOSCOPY  WITH RETROGRADE PYELOGRAM, URETEROSCOPY AND STENT PLACEMENT;  Surgeon: Bernestine Amass, MD;  Location: [facility];  Service: Urology;  Laterality: Right;  . ECTOPIC PREGNANCY SURGERY  YRS AGO   SALPINGECTOMY  . ESOPHAGOGASTRODUODENOSCOPY N/A 02/15/2014   Procedure: ESOPHAGOGASTRODUODENOSCOPY (EGD);  Surgeon: Jerene Bears, MD;  Location: [facility];  Service: Endoscopy;  Laterality: N/A;  . ESOPHAGOGASTRODUODENOSCOPY (EGD) WITH ESOPHAGEAL DILATION  05-20-2011  . HOLMIUM LASER APPLICATION Right 03/12/5174   Procedure: HOLMIUM LASER APPLICATION;  Surgeon: Bernestine Amass, MD;  Location: [facility];  Service: Urology;  Laterality: Right;  . LEFT HEART CATHETERIZATION WITH CORONARY ANGIOGRAM N/A 03/14/2012   Procedure: LEFT HEART CATHETERIZATION WITH CORONARY ANGIOGRAM;  Surgeon: Sueanne Margarita, MD;  Location: [place] CATH LAB;  Service: Cardiovascular;  Laterality: N/A;  Normal LM,  50% pLAD,  70% mLAD,  D2 50-70%, very tortuous LAD,  70-82% ostial LCFX,  50% in-stent restenosis of  dRCA and mRCA  stent and 50-70% ostial PDA,  normal LVSF, ef 60%  . LUNG BIOPSY Right 01/24/2015   Procedure: RIGHT LUNG BIOPSY;  Surgeon: Ivin Poot, MD;  Location: [place];  Service: Thoracic;  Laterality: Right;  . MEDIASTERNOTOMY N/A 02/05/2013   Procedure: MEDIAN STERNOTOMY;  Surgeon: Ivin Poot, MD;  Location: [place];  Service: Thoracic;  Laterality: N/A;  . MEDIASTERNOTOMY Left 02/05/2013  . NEEDLE GUIDED EXCISION BREAST CALCIFICATIONS Right 08-09-2008  . PERCUTANEOUS CORONARY STENT INTERVENTION (PCI-S) N/A 04/03/2012   Procedure: PERCUTANEOUS CORONARY STENT INTERVENTION (PCI-S);  Surgeon: Jettie Booze, MD;  Location: [facility] CATH LAB;  Service: Cardiovascular;  Laterality: N/A;   Successful PCI  mLAD with 2.75x12 Promus stent, postdilated to >0.74mm  . THORACOTOMY Right 01/24/2015   Procedure: RIGHT MINI/LIMITED THORACOTOMY;  Surgeon: Ivin Poot, MD;  Location: [place];  Service:  Thoracic;  Laterality: Right;  . TRANSTHORACIC ECHOCARDIOGRAM  11-17-2011   grade I diastolic dysfunction/  ef 55-60%  . VIDEO BRONCHOSCOPY N/A 01/24/2015  Procedure: RIGHT VIDEO BRONCHOSCOPY;  Surgeon: Ivin Poot, MD;  Location: [facility] OR;  Service: Thoracic;  Laterality: N/A;    No Active Allergies  Immunization History  Administered Date(s) Administered  . Influenza, High Dose Seasonal PF 10/12/2015  . Influenza,inj,Quad PF,6+ Mos 09/23/2014  . Influenza,inj,quad, With Preservative 10/11/2016  . Influenza-Unspecified 10/11/2012  . Pneumococcal-Unspecified 11/15/2009  . Zoster 02/07/2012    Family History  Problem Relation Age of Onset  . Breast cancer Sister   . Heart disease Father   . Hypertension Father   . Emphysema Father   . Coronary artery disease Father   . Diabetes Mellitus I Father   . Heart attack Mother   . CVA Mother   . Breast cancer Unknown        niece  . Colon cancer Neg Hx   . Stomach cancer Neg Hx   . Esophageal cancer Neg Hx   . Rectal cancer Neg Hx      Current Outpatient Medications:  .  ALPRAZolam (XANAX) 0.25 MG tablet, Take 0.25 mg by mouth at bedtime., Disp: , Rfl:  .  amLODipine (NORVASC) 5 MG tablet, Take 1 tablet (5 mg total) by mouth daily., Disp: 90 tablet, Rfl: 1 .  aspirin EC 81 MG tablet, Take 81 mg by mouth daily., Disp: , Rfl:  .  BYSTOLIC 5 MG tablet, TAKE 1 TABLET (5 MG TOTAL) BY MOUTH DAILY., Disp: 90 tablet, Rfl: 3 .  calcitRIOL (ROCALTROL) 0.5 MCG capsule, Take 0.5 mcg by mouth daily., Disp: , Rfl:  .  clopidogrel (PLAVIX) 75 MG tablet, TAKE 1 TABLET (75 MG TOTAL) BY MOUTH DAILY WITH BREAKFAST., Disp: 90 tablet, Rfl: 3 .  diltiazem (CARDIZEM CD) 240 MG 24 hr capsule, Take 1 capsule (240 mg total) by mouth daily., Disp: 90 capsule, Rfl: 2 .  doxazosin (CARDURA) 2 MG tablet, TAKE 1 TABLET (2 MG TOTAL) BY MOUTH DAILY., Disp: 30 tablet, Rfl: 6 .  DULoxetine (CYMBALTA) 20 MG capsule, Take 20 mg by mouth daily., Disp: , Rfl:  .   ferrous sulfate 325 (65 FE) MG tablet, Take 325 mg by mouth daily with breakfast. , Disp: , Rfl:  .  furosemide (LASIX) 40 MG tablet, TAKE 1 TABLET BY MOUTH EVERY OTHER DAY, Disp: 15 tablet, Rfl: 10 .  HYDROcodone-acetaminophen (NORCO) 10-325 MG tablet, Take 1 tablet by mouth every 6 (six) hours as needed., Disp: , Rfl:  .  isosorbide mononitrate (IMDUR) 60 MG 24 hr tablet, TAKE 1 AND 1/2 TABLETS BY MOUTH EVERY DAY, Disp: 45 tablet, Rfl: 10 .  levETIRAcetam (KEPPRA) 500 MG tablet, Take 1 tablet (500 mg total) by mouth 2 (two) times daily., Disp: 180 tablet, Rfl: 4 .  levothyroxine (SYNTHROID, LEVOTHROID) 112 MCG tablet, Take 112 mcg by mouth daily., Disp: , Rfl: 5 .  losartan (COZAAR) 50 MG tablet, Take 50 mg daily by mouth., Disp: , Rfl:  .  magnesium oxide (MAG-OX) 400 MG tablet, Take 1 tablet (400 mg total) by mouth 2 (two) times daily., Disp: 60 tablet, Rfl: 7 .  pantoprazole (PROTONIX) 40 MG tablet, TAKE 1 TABLET (40 MG TOTAL) BY MOUTH 2 (TWO) TIMES DAILY., Disp: 60 tablet, Rfl: 10 .  potassium chloride SA (K-DUR,KLOR-CON) 20 MEQ tablet, Take 40 mEq by mouth every other day., Disp: , Rfl:  .  rosuvastatin (CRESTOR) 20 MG tablet, TAKE 1 TABLET (20 MG TOTAL) BY MOUTH EVERY MORNING., Disp: 90 tablet, Rfl: 2 .  nitroGLYCERIN (NITROSTAT) 0.4 MG SL tablet, PLACE 1  TABLET (0.4 MG TOTAL) UNDER THE TONGUE EVERY 5 (FIVE) MINUTES AS NEEDED FOR CHEST PAIN. (Patient not taking: Reported on 05/19/2017), Disp: 75 tablet, Rfl: 1    Review of Systems     Objective:   Physical Exam Vitals:   05/19/17 0911  BP: 112/62  Pulse: 81  SpO2: 96%  Weight: 150 lb 12.8 oz (68.4 kg)  Height: 4\' 11"  (1.499 m)    Estimated body mass index is 30.46 kg/m as calculated from the following:   Height as of this encounter: 4\' 11"  (1.499 m).   Weight as of this encounter: 150 lb 12.8 oz (68.4 kg).    General Appearance:     OBESE - yes  Head:    Normocephalic, without obvious abnormality, atraumatic  Eyes:     PERRL - yes, conjunctiva/corneas - clear      Ears:    Normal external ear canals, both ears  Nose:   NG tube - no  Throat:  ETT TUBE - no , OG tube - no  Neck:   Supple,  No enlargement/tenderness/nodules     Lungs:     Clear to auscultation bilaterally,  Chest wall:    No deformity  Heart:    S1 and S2 normal, no murmur, .   Abdomen:     Soft, no masses, no organomegaly  Genitalia:    Not done  Rectal:   not done  Extremities:   Extremities- intact     Skin:   Intact in exposed areas .      Neurologic:   Sedation - none -> RASS - na . Moves all 4s - yes. CAM-ICU - neg . Orientation - x3+        Assessment:       ICD-10-CM   1. Dyspnea and respiratory abnormality R06.00    R06.89        Plan:      Shortness of breath is due to weight and heart muscle stiffness and physical deconditioning Glad things are stable in the last 1 year  Plan -We will continue to keep an eye on you.  Return in 1 year for follow-up or sooner if needed    Dr. Brand Males, M.D., [facility].C.P Pulmonary and Critical Care Medicine Staff Physician, [facility] Director - Interstitial Lung Disease  Program  Pulmonary [place] at [place], [place], [address]  Pager: [phone], If no answer or between  15:00h - 7:00h: call [phone] Telephone: [phone]

## 2017-05-19 NOTE — Patient Instructions (Signed)
ICD-10-CM   1. Dyspnea and respiratory abnormality R06.00    R06.89    Shortness of breath is due to weight and heart muscle stiffness and physical deconditioning Glad things are stable in the last 1 year  Plan -We will continue to keep an eye on you.  Return in 1 year for follow-up or sooner if needed

## 2017-06-09 DIAGNOSIS — R197 Diarrhea, unspecified: Secondary | ICD-10-CM | POA: Diagnosis not present

## 2017-07-03 ENCOUNTER — Other Ambulatory Visit: Payer: Self-pay | Admitting: Adult Health

## 2017-07-28 DIAGNOSIS — G894 Chronic pain syndrome: Secondary | ICD-10-CM | POA: Diagnosis not present

## 2017-07-28 DIAGNOSIS — Z79891 Long term (current) use of opiate analgesic: Secondary | ICD-10-CM | POA: Diagnosis not present

## 2017-08-08 ENCOUNTER — Telehealth: Payer: Self-pay | Admitting: Cardiology

## 2017-08-08 ENCOUNTER — Other Ambulatory Visit: Payer: Self-pay

## 2017-08-08 MED ORDER — ISOSORBIDE MONONITRATE ER 120 MG PO TB24: 120.0000 mg | ORAL_TABLET | Freq: Every day | ORAL | 3 refills | Status: DC

## 2017-08-08 NOTE — Telephone Encounter (Signed)
Spoke to patient who stated that she has been experiencing some stress due to her husband's care.  She has been experiencing CP and nausea.  No SOB or other symptoms.    We discussed the NTG protocol and she will try that if the CP recurs.  I instructed her to go to the ED if the NTG does not relieve her symptoms.  She has an appt here in the office 8/1 with Dr Radford Pax.  Again, she was instructed to go to the ED if symptoms worsen.

## 2017-08-08 NOTE — Telephone Encounter (Signed)
Increase Imdur to 120mg  daily

## 2017-08-08 NOTE — Telephone Encounter (Signed)
Spoke with patient and informed her of recommendations.  She verbalized understanding.

## 2017-08-08 NOTE — Telephone Encounter (Signed)
New message   Patient calling, states she is stressed out caring for her husband.  Patient states she passed out on last Friday.      1. Are you having CP right now?  No  2. Are you experiencing any other symptoms (ex. SOB, nausea, vomiting, sweating)? nausea  3. How long have you been experiencing CP? 1 week  4. Is your CP continuous or coming and going? Coming and going 5. Have you taken Nitroglycerin? Yes   1. Did you pass out today? NO  2. When is the last time you passed out? 7/26  3. Has this occurred multiple times? 1  4. Did you have any symptoms prior to passing out? lightheaded  ?

## 2017-08-11 ENCOUNTER — Encounter: Payer: Self-pay | Admitting: Cardiology

## 2017-08-11 ENCOUNTER — Encounter: Payer: Self-pay | Admitting: *Deleted

## 2017-08-11 ENCOUNTER — Encounter (INDEPENDENT_AMBULATORY_CARE_PROVIDER_SITE_OTHER): Payer: Self-pay

## 2017-08-11 ENCOUNTER — Ambulatory Visit: Payer: PPO | Admitting: Cardiology

## 2017-08-11 VITALS — BP 90/54 | HR 82 | Ht 59.0 in | Wt 144.3 lb

## 2017-08-11 DIAGNOSIS — R079 Chest pain, unspecified: Secondary | ICD-10-CM

## 2017-08-11 DIAGNOSIS — I471 Supraventricular tachycardia: Secondary | ICD-10-CM

## 2017-08-11 DIAGNOSIS — I10 Essential (primary) hypertension: Secondary | ICD-10-CM | POA: Diagnosis not present

## 2017-08-11 DIAGNOSIS — Q248 Other specified congenital malformations of heart: Secondary | ICD-10-CM | POA: Diagnosis not present

## 2017-08-11 DIAGNOSIS — I350 Nonrheumatic aortic (valve) stenosis: Secondary | ICD-10-CM | POA: Diagnosis not present

## 2017-08-11 DIAGNOSIS — I2510 Atherosclerotic heart disease of native coronary artery without angina pectoris: Secondary | ICD-10-CM | POA: Diagnosis not present

## 2017-08-11 DIAGNOSIS — E7800 Pure hypercholesterolemia, unspecified: Secondary | ICD-10-CM | POA: Diagnosis not present

## 2017-08-11 DIAGNOSIS — I2583 Coronary atherosclerosis due to lipid rich plaque: Secondary | ICD-10-CM

## 2017-08-11 DIAGNOSIS — I951 Orthostatic hypotension: Secondary | ICD-10-CM | POA: Diagnosis not present

## 2017-08-11 NOTE — Patient Instructions (Addendum)
Your physician has recommended you make the following change in your medication:  1.) stop doxazosin 2.) stop amlodipine  Your physician has requested that you have a lexiscan myoview. For further information please visit [email or web address]. Please follow instruction sheet, as given.  Per Dr. Radford Pax it is ok to wait to schedule this exam until after you see your PCP.  Your physician recommends that you schedule a follow-up appointment in: 1-2 weeks with physician extender  Addendum: A message was left at your primary care office for Dr. Tamala Julian, requesting an appointment for you to be seen per Dr. Radford Pax, for anxiety.  Dr. Tamala Julian or her assistant will call you.

## 2017-08-11 NOTE — Progress Notes (Signed)
Cardiology Office Note:    Date:  08/11/2017   ID:  [name], DOB 10/13/1945, MRN [id number]  PCP:  Carol Ada, MD  Cardiologist:  No primary care provider on file.    Referring MD: Carol Ada, MD   Chief Complaint  Patient presents with  . Coronary Artery Disease  . Hypertension  . Hyperlipidemia  . Aortic Stenosis    History of Present Illness:    [name] is a 72 y.o. female with a hx of CAD/HTN, pericardial cyst s/p resection and dyslipidemia. She has a history of chronic CP with esophageal strictures and has had esophageal dilatation.She underwent VATS procedure via mini thoracotomy syndrome with open lung bx and was diagnosed with interstitial lung disease. She has chronic CP that has been felt to be noncardiac in the past and has started to reoccur. She has chronic DOE.   She underwent a cardiopulmonary stress test by Pulmonary to evaluate her SOB and it showed deconditioning and poor HR response to exercise. At the request of the pulmonologist her CCB and BB were stoppedas they though her SOB was due to chronotropic incompetence.She then started having palpitations and was seen byher PCP,Dr. [name] was told to take bystolic PRN for palpitations.  She then saw Dr. [name] who put her back on Cardizem and dropped her amlodipine to 5mg  daily.A nuclear stress test was done in January 2018 which showed no ischemia. This was done for similar chest pain that she had in the past. This chest pain occurred around the time she lost her dog.  Patient is now here with complaints of recurrent chest pain.  Apparently she is under a lot of stress caring for her husband who is sick.  When she called in a few days ago I recommend increasing Imdur to 120 mg daily and she is now here for follow-up.  She denies any further chest pain or pressure since going up on the long acting nitrate.  She has chronic SOB which is unchanged and felt secondary to deconditioning  and interstitial lung disease..  She denies any PND, orthopnea, LE edema, dizziness, palpitations or syncope. She is compliant with her meds and is tolerating meds with no SE.     Past Medical History:  Diagnosis Date  . Anxiety   . Arthritis   . Carpal tunnel syndrome, bilateral 07/31/2015  . Cataract   . Cervical spondylosis without myelopathy   . Chronic chest pain   . Chronic kidney disease   . Chronic low back pain   . Chronotropic incompetence   . CKD (chronic kidney disease), stage III ([place])   . Coronary artery disease    a. inf-post MI 2011 s/p DES to RCA. b. DES to RCA 06/2010. c. DES to LAD 2014, residual dz treated medically.  . Depression   . Esophageal stricture   . GERD (gastroesophageal reflux disease)   . Gross hematuria   . History of esophageal dilatation    FOR STRIUCTURE  . History of gout   . History of hiatal hernia   . History of kidney stones   . Hyperlipidemia   . Hypertension   . Hypothyroidism   . Internal hemorrhoids   . Irritable bowel syndrome   . Mild aortic stenosis    by echo 2018  . Obesity   . Orthostatic hypotension   . Partial seizure disorder (HCC) NEUROLOGIST-- DR WILLIS   NOCTURNAL  . PONV (postoperative nausea and vomiting)    hard time getting  iv site-had to do neck stick 2 yrs ago  . Pulmonary fibrosis ([place])   . S/P pericardial cyst excision    02-05-2013  benign  . Seizures ([place])    none in yrs  . SVT (supraventricular tachycardia) ([place])   . Trigger finger, acquired 09/30/2015   Right middle finger  . Vitamin D deficiency     Past Surgical History:  Procedure Laterality Date  . BIOPSY OF MEDIASTINAL MASS N/A 02/05/2013   Procedure: RESECTION OF MEDIASTINAL MASS;  Surgeon: Ivin Poot, MD;  Location: [place];  Service: Thoracic;  Laterality: N/A;  . CARDIAC CATHETERIZATION Right 01/24/2015   Procedure: right femoral ARTERIAL LINE INSERTION;  Surgeon: Ivin Poot, MD;  Location: [place];  Service: Thoracic;  Laterality:  Right;  . CARDIOVASCULAR STRESS TEST  11-22-2013  dr Irish Lack   normal lexiscan study/  no ischemia/  normal LVF and wall motion/ ef 81%  . COLONOSCOPY  last one 2014  . CORONARY ANGIOGRAM  03/10/2011   Procedure: CORONARY ANGIOGRAM;  Surgeon: Jettie Booze, MD;  Location: [facility] CATH LAB;  Service: Cardiovascular;;  . CORONARY ANGIOPLASTY WITH STENT PLACEMENT  11-22-2009  dr Irish Lack   Acute inferoposterior MI/  ef 60%,  PCI with DES x1 to dRCA,  25%  mLAD  . CORONARY ANGIOPLASTY WITH STENT PLACEMENT  06-26-2010  dr Daneen Schick   Cutting balloon angioplasty to dRCA with DES x1,  40% mLAD (non-obstructive CAD)  . CYSTOSCOPY WITH RETROGRADE PYELOGRAM, URETEROSCOPY AND STENT PLACEMENT Right 02/13/2014   Procedure: CYSTOSCOPY WITH RETROGRADE PYELOGRAM, URETEROSCOPY AND STENT PLACEMENT;  Surgeon: Bernestine Amass, MD;  Location: [facility];  Service: Urology;  Laterality: Right;  . ECTOPIC PREGNANCY SURGERY  YRS AGO   SALPINGECTOMY  . ESOPHAGOGASTRODUODENOSCOPY N/A 02/15/2014   Procedure: ESOPHAGOGASTRODUODENOSCOPY (EGD);  Surgeon: Jerene Bears, MD;  Location: [facility];  Service: Endoscopy;  Laterality: N/A;  . ESOPHAGOGASTRODUODENOSCOPY (EGD) WITH ESOPHAGEAL DILATION  05-20-2011  . HOLMIUM LASER APPLICATION Right 02/18/348   Procedure: HOLMIUM LASER APPLICATION;  Surgeon: Bernestine Amass, MD;  Location[facility];  Service: Urology;  Laterality: Right;  . LEFT HEART CATHETERIZATION WITH CORONARY ANGIOGRAM N/A 03/14/2012   Procedure: LEFT HEART CATHETERIZATION WITH CORONARY ANGIOGRAM;  Surgeon: Sueanne Margarita, MD;  Location: [place] CATH LAB;  Service: Cardiovascular;  Laterality: N/A;  Normal LM,  50% pLAD,  70% mLAD,  D2 50-70%, very tortuous LAD,  70-82% ostial LCFX,  50% in-stent restenosis of  dRCA and mRCA  stent and 50-70% ostial PDA,  normal LVSF, ef 60%  . LUNG BIOPSY Right 01/24/2015   Procedure: RIGHT LUNG BIOPSY;  Surgeon: Ivin Poot, MD;  Location: [place];   Service: Thoracic;  Laterality: Right;  . MEDIASTERNOTOMY N/A 02/05/2013   Procedure: MEDIAN STERNOTOMY;  Surgeon: Ivin Poot, MD;  Location: [place];  Service: Thoracic;  Laterality: N/A;  . MEDIASTERNOTOMY Left 02/05/2013  . NEEDLE GUIDED EXCISION BREAST CALCIFICATIONS Right 08-09-2008  . PERCUTANEOUS CORONARY STENT INTERVENTION (PCI-S) N/A 04/03/2012   Procedure: PERCUTANEOUS CORONARY STENT INTERVENTION (PCI-S);  Surgeon: Jettie Booze, MD;  Location[facility];  Service: Cardiovascular;  Laterality: N/A;   Successful PCI  mLAD with 2.75x12 Promus stent, postdilated to >0.75mm  . THORACOTOMY Right 01/24/2015   Procedure: RIGHT MINI/LIMITED THORACOTOMY;  Surgeon: Ivin Poot, MD;  Location: [place];  Service: Thoracic;  Laterality: Right;  . TRANSTHORACIC ECHOCARDIOGRAM  11-17-2011   grade I diastolic dysfunction/  ef  55-60%  . VIDEO BRONCHOSCOPY N/A 01/24/2015   Procedure: RIGHT VIDEO BRONCHOSCOPY;  Surgeon: Ivin Poot, MD;  Location: [facility] OR;  Service: Thoracic;  Laterality: N/A;    Current Medications: Current Meds  Medication Sig  . ALPRAZolam (XANAX) 0.25 MG tablet Take 0.25 mg by mouth at bedtime.  Marland Kitchen aspirin EC 81 MG tablet Take 81 mg by mouth daily.  Marland Kitchen BYSTOLIC 5 MG tablet TAKE 1 TABLET (5 MG TOTAL) BY MOUTH DAILY.  . calcitRIOL (ROCALTROL) 0.5 MCG capsule Take 0.5 mcg by mouth daily.  . clopidogrel (PLAVIX) 75 MG tablet TAKE 1 TABLET (75 MG TOTAL) BY MOUTH DAILY WITH BREAKFAST.  Marland Kitchen diltiazem (CARDIZEM CD) 240 MG 24 hr capsule Take 1 capsule (240 mg total) by mouth daily.  . DULoxetine (CYMBALTA) 20 MG capsule Take 20 mg by mouth daily.  . ferrous sulfate 325 (65 FE) MG tablet Take 325 mg by mouth daily with breakfast.   . furosemide (LASIX) 40 MG tablet TAKE 1 TABLET BY MOUTH EVERY OTHER DAY  . HYDROcodone-acetaminophen (NORCO) 10-325 MG tablet Take 1 tablet by mouth every 6 (six) hours as needed.  . isosorbide mononitrate (IMDUR) 120 MG 24 hr tablet Take 1 tablet (120  mg total) by mouth daily.  Marland Kitchen levETIRAcetam (KEPPRA) 500 MG tablet TAKE 1 TABLET BY MOUTH TWICE A DAY  . levothyroxine (SYNTHROID, LEVOTHROID) 112 MCG tablet Take 112 mcg by mouth daily.  Marland Kitchen losartan (COZAAR) 50 MG tablet Take 50 mg daily by mouth.  . Magnesium Oxide 200 MG TABS Take 200 mg by mouth daily.  . nitroGLYCERIN (NITROSTAT) 0.4 MG SL tablet PLACE 1 TABLET (0.4 MG TOTAL) UNDER THE TONGUE EVERY 5 (FIVE) MINUTES AS NEEDED FOR CHEST PAIN.  Marland Kitchen pantoprazole (PROTONIX) 40 MG tablet TAKE 1 TABLET (40 MG TOTAL) BY MOUTH 2 (TWO) TIMES DAILY.  Marland Kitchen potassium chloride SA (K-DUR,KLOR-CON) 20 MEQ tablet Take 40 mEq by mouth every other day.  . rosuvastatin (CRESTOR) 20 MG tablet TAKE 1 TABLET (20 MG TOTAL) BY MOUTH EVERY MORNING.  . [DISCONTINUED] amLODipine (NORVASC) 5 MG tablet Take 1 tablet (5 mg total) by mouth daily.  . [DISCONTINUED] doxazosin (CARDURA) 2 MG tablet TAKE 1 TABLET (2 MG TOTAL) BY MOUTH DAILY.     Allergies:   Patient has no active allergies.   Social History   Socioeconomic History  . Marital status: Married    Spouse name: Fritz Pickerel  . Number of children: 0  . Years of education: hs  . Highest education level: Not on file  Occupational History  . Occupation: Retired    Fish farm manager: RETIRED  Social Needs  . Financial resource strain: Not on file  . Food insecurity:    Worry: Not on file    Inability: Not on file  . Transportation needs:    Medical: Not on file    Non-medical: Not on file  Tobacco Use  . Smoking status: Former Smoker    Packs/day: 0.30    Years: 3.00    Pack years: 0.90    Types: Cigarettes    Start date: 11/08/1964    Last attempt to quit: 11/09/1967    Years since quitting: 49.7  . Smokeless tobacco: Never Used  Substance and Sexual Activity  . Alcohol use: No    Alcohol/week: 0.0 oz  . Drug use: No  . Sexual activity: Not on file  Lifestyle  . Physical activity:    Days per week: Not on file    Minutes per session: Not  on file  . Stress:  Not on file  Relationships  . Social connections:    Talks on phone: Not on file    Gets together: Not on file    Attends religious service: Not on file    Active member of club or organization: Not on file    Attends meetings of clubs or organizations: Not on file    Relationship status: Not on file  Other Topics Concern  . Not on file  Social History Narrative   Lives at home, married   Left-handed   Daily caffeine use: coffee.     Family History: The patient's family history includes Breast cancer in her sister and unknown relative; CVA in her mother; Coronary artery disease in her father; Diabetes Mellitus I in her father; Emphysema in her father; Heart attack in her mother; Heart disease in her father; Hypertension in her father. There is no history of Colon cancer, Stomach cancer, Esophageal cancer, or Rectal cancer.  ROS:   Please see the history of present illness.    ROS  All other systems reviewed and negative.   EKGs/Labs/Other Studies Reviewed:    The following studies were reviewed today: none  EKG:  EKG is  ordered today.  The ekg ordered today demonstrates normal sinus rhythm at 60 bpm with no ST-T wave at normalities.  Prior anterior infarct noted.    Recent Labs: 11/23/2016: ALT 6   Recent Lipid Panel    Component Value Date/Time   CHOL 132 11/23/2016 1109   TRIG 56 11/23/2016 1109   HDL 70 11/23/2016 1109   CHOLHDL 1.9 11/23/2016 1109   CHOLHDL 1.8 06/06/2015 0928   VLDL 11 06/06/2015 0928   LDLCALC 51 11/23/2016 1109    Physical Exam:    VS:  BP (!) 90/54 (BP Location: Right Arm, Patient Position: Sitting, Cuff Size: Normal)   Pulse 82   Ht 4\' 11"  (1.499 m)   Wt 144 lb 4.8 oz (65.5 kg)   SpO2 97%   BMI 29.15 kg/m     Wt Readings from Last 3 Encounters:  08/11/17 144 lb 4.8 oz (65.5 kg)  05/19/17 150 lb 12.8 oz (68.4 kg)  04/18/17 150 lb 12.8 oz (68.4 kg)     GEN:  Well nourished, well developed in no acute distress HEENT:  Normal NECK: No JVD; No carotid bruits LYMPHATICS: No lymphadenopathy CARDIAC: RRR, no murmurs, rubs, gallops RESPIRATORY:  Clear to auscultation without rales, wheezing or rhonchi  ABDOMEN: Soft, non-tender, non-distended MUSCULOSKELETAL:  No edema; No deformity  SKIN: Warm and dry NEUROLOGIC:  Alert and oriented x 3 PSYCHIATRIC:  Normal affect   ASSESSMENT:    1. Coronary atherosclerosis due to lipid rich plaque   2. Essential hypertension   3. Paroxysmal SVT (supraventricular tachycardia) ([place])   4. Pericardial cyst   5. Mild aortic stenosis   6. Pure hypercholesterolemia   7. Chest pain, unspecified type   8. Syncope due to orthostatic hypotension    PLAN:    In order of problems listed above:  1.  ASCAD - s/p inf-post MI 2011 s/p DES to RCA. Recurrent CP s/p DES to RCA 06/2010 and then DES to LAD 2014 with residual dz treated medically.  She has chronic noncardiac chest pain related to esophageal spasm and esophageal stricture.  She also has noncardiac chest pain that is related to significant stressors in her life.  Her stress test January 2018 showed no inducible ischemia.  She is now having recurrent  chest pain that she describes as a pressure that does improve with nitroglycerin.  This started after her husband started developing depression to the point that he did not want to get out of bed.  She admits to decreased p.o. intake and has had chronic nausea for over a week.  She does have chronic shortness of breath but that has not changed any.  I reassured her that I think her chest pain is related to stress and recommended that she make an appointment to follow-up with her PCP to get help with her anxiety. EKG was nonischemic in the office.  She does not want to pursue stress testing at this time as she feels her chest pain is related to anxiety.  She will continue on aspirin 81 mg daily, beta-blocker, Plavix 75 mg daily, Imdur 120 mg daily and Crestor 20 mg daily  2.  HTN - BP  is well controlled on exam today and actually on the soft side.  She did have an episode of syncope a week ago which I think was likely secondary to decreased p.o. intake as her systolic blood pressure is 90 today.  I have encouraged her to try to increase her p.o. intake and salt intake.  She will continue on losartan 50 mg daily as well as Cardizem CD 240 mg daily and  Bystolic 5 mg daily which she takes for her PSVT .  I have instructed her to stop her amlodipine as well as doxazosin for now since her blood pressure is low.  I will have her follow-up with the extender in 1 week.  3.  PSVT - she has had no further recurrence on Cardizem CD 240 mg daily as well as Bystolic 5 mg daily.  4.  Pericardial cyst -status post resection  5.  Mild AS -echo 03/2016 showed normal LV function with EF 60 to 65% with very mild aortic stenosis.  6.  Hyperlipidemia - LDL goal is less than 70.  Her LDL was 51 on 11/23/2016 with a normal ALT.  She will continue on Crestor 20 mg daily.  7.  Syncope -this occurred after standing up for period of time.  This is likely related to orthostatic hypotension from dehydration secondary to reduced p.o. intake over the past few weeks worrying about her husband's health.  She is very orthostatic on exam.  Blood pressure lying was 105 or 70 mmHg, sitting 76/59 mmHg and standing 76/57 mmHg.  Heart rate went from 69 bpm lying to 83 bpm standing.  I have told her to stop her doxazosin as well as amlodipine.  I am going to have her hold her Lasix as well.  She will follow-up with an extender earlynext week for follow-up evaluation.   Medication Adjustments/Labs and Tests Ordered: Current medicines are reviewed at length with the patient today.  Concerns regarding medicines are outlined above.  Orders Placed This Encounter  Procedures  . MYOCARDIAL PERFUSION IMAGING   No orders of the defined types were placed in this encounter.   Signed, Fransico Him, MD  08/11/2017 3:48 PM     [place]

## 2017-08-15 DIAGNOSIS — R634 Abnormal weight loss: Secondary | ICD-10-CM | POA: Diagnosis not present

## 2017-08-15 DIAGNOSIS — F411 Generalized anxiety disorder: Secondary | ICD-10-CM | POA: Diagnosis not present

## 2017-08-15 DIAGNOSIS — F331 Major depressive disorder, recurrent, moderate: Secondary | ICD-10-CM | POA: Diagnosis not present

## 2017-08-16 NOTE — Addendum Note (Signed)
Addended by: Patterson Hammersmith A on: 08/16/2017 11:07 AM   Modules accepted: Orders

## 2017-08-18 ENCOUNTER — Emergency Department (HOSPITAL_COMMUNITY): Payer: PPO

## 2017-08-18 ENCOUNTER — Ambulatory Visit: Payer: PPO | Admitting: Cardiology

## 2017-08-18 ENCOUNTER — Encounter (HOSPITAL_COMMUNITY): Payer: Self-pay | Admitting: Emergency Medicine

## 2017-08-18 ENCOUNTER — Encounter: Payer: Self-pay | Admitting: Cardiology

## 2017-08-18 ENCOUNTER — Inpatient Hospital Stay (HOSPITAL_COMMUNITY)
Admission: EM | Admit: 2017-08-18 | Discharge: 2017-08-20 | DRG: 312 | Disposition: A | Discharge status: Home Health | Payer: PPO | Source: Ambulatory Visit | Attending: Internal Medicine | Admitting: Internal Medicine

## 2017-08-18 ENCOUNTER — Emergency Department (HOSPITAL_BASED_OUTPATIENT_CLINIC_OR_DEPARTMENT_OTHER): Payer: PPO

## 2017-08-18 ENCOUNTER — Other Ambulatory Visit: Payer: Self-pay

## 2017-08-18 VITALS — BP 102/72 | HR 96 | Ht 59.0 in | Wt 142.0 lb

## 2017-08-18 DIAGNOSIS — I252 Old myocardial infarction: Secondary | ICD-10-CM | POA: Diagnosis not present

## 2017-08-18 DIAGNOSIS — E785 Hyperlipidemia, unspecified: Secondary | ICD-10-CM | POA: Diagnosis not present

## 2017-08-18 DIAGNOSIS — R197 Diarrhea, unspecified: Secondary | ICD-10-CM

## 2017-08-18 DIAGNOSIS — Z825 Family history of asthma and other chronic lower respiratory diseases: Secondary | ICD-10-CM

## 2017-08-18 DIAGNOSIS — Z7902 Long term (current) use of antithrombotics/antiplatelets: Secondary | ICD-10-CM | POA: Diagnosis not present

## 2017-08-18 DIAGNOSIS — F419 Anxiety disorder, unspecified: Secondary | ICD-10-CM | POA: Diagnosis not present

## 2017-08-18 DIAGNOSIS — Z79890 Hormone replacement therapy (postmenopausal): Secondary | ICD-10-CM

## 2017-08-18 DIAGNOSIS — I471 Supraventricular tachycardia: Secondary | ICD-10-CM

## 2017-08-18 DIAGNOSIS — I951 Orthostatic hypotension: Secondary | ICD-10-CM | POA: Insufficient documentation

## 2017-08-18 DIAGNOSIS — I2510 Atherosclerotic heart disease of native coronary artery without angina pectoris: Secondary | ICD-10-CM | POA: Diagnosis not present

## 2017-08-18 DIAGNOSIS — R5383 Other fatigue: Secondary | ICD-10-CM | POA: Diagnosis not present

## 2017-08-18 DIAGNOSIS — I129 Hypertensive chronic kidney disease with stage 1 through stage 4 chronic kidney disease, or unspecified chronic kidney disease: Secondary | ICD-10-CM | POA: Diagnosis present

## 2017-08-18 DIAGNOSIS — R531 Weakness: Secondary | ICD-10-CM | POA: Diagnosis not present

## 2017-08-18 DIAGNOSIS — Z8669 Personal history of other diseases of the nervous system and sense organs: Secondary | ICD-10-CM

## 2017-08-18 DIAGNOSIS — Z7982 Long term (current) use of aspirin: Secondary | ICD-10-CM | POA: Diagnosis not present

## 2017-08-18 DIAGNOSIS — R42 Dizziness and giddiness: Secondary | ICD-10-CM | POA: Diagnosis not present

## 2017-08-18 DIAGNOSIS — Z833 Family history of diabetes mellitus: Secondary | ICD-10-CM

## 2017-08-18 DIAGNOSIS — M79609 Pain in unspecified limb: Secondary | ICD-10-CM | POA: Diagnosis not present

## 2017-08-18 DIAGNOSIS — Z803 Family history of malignant neoplasm of breast: Secondary | ICD-10-CM | POA: Diagnosis not present

## 2017-08-18 DIAGNOSIS — Z823 Family history of stroke: Secondary | ICD-10-CM

## 2017-08-18 DIAGNOSIS — J8410 Pulmonary fibrosis, unspecified: Secondary | ICD-10-CM | POA: Diagnosis present

## 2017-08-18 DIAGNOSIS — R195 Other fecal abnormalities: Secondary | ICD-10-CM | POA: Diagnosis not present

## 2017-08-18 DIAGNOSIS — F329 Major depressive disorder, single episode, unspecified: Secondary | ICD-10-CM | POA: Diagnosis present

## 2017-08-18 DIAGNOSIS — M7989 Other specified soft tissue disorders: Secondary | ICD-10-CM

## 2017-08-18 DIAGNOSIS — E039 Hypothyroidism, unspecified: Secondary | ICD-10-CM | POA: Diagnosis present

## 2017-08-18 DIAGNOSIS — Z8249 Family history of ischemic heart disease and other diseases of the circulatory system: Secondary | ICD-10-CM

## 2017-08-18 DIAGNOSIS — K219 Gastro-esophageal reflux disease without esophagitis: Secondary | ICD-10-CM | POA: Diagnosis present

## 2017-08-18 DIAGNOSIS — K921 Melena: Secondary | ICD-10-CM | POA: Diagnosis not present

## 2017-08-18 DIAGNOSIS — I82409 Acute embolism and thrombosis of unspecified deep veins of unspecified lower extremity: Secondary | ICD-10-CM

## 2017-08-18 DIAGNOSIS — G40909 Epilepsy, unspecified, not intractable, without status epilepticus: Secondary | ICD-10-CM | POA: Diagnosis not present

## 2017-08-18 DIAGNOSIS — F32A Depression, unspecified: Secondary | ICD-10-CM | POA: Diagnosis present

## 2017-08-18 DIAGNOSIS — R1310 Dysphagia, unspecified: Secondary | ICD-10-CM | POA: Diagnosis not present

## 2017-08-18 DIAGNOSIS — E876 Hypokalemia: Secondary | ICD-10-CM | POA: Diagnosis present

## 2017-08-18 DIAGNOSIS — N179 Acute kidney failure, unspecified: Secondary | ICD-10-CM | POA: Diagnosis not present

## 2017-08-18 DIAGNOSIS — I10 Essential (primary) hypertension: Secondary | ICD-10-CM | POA: Diagnosis present

## 2017-08-18 DIAGNOSIS — R0602 Shortness of breath: Secondary | ICD-10-CM | POA: Diagnosis not present

## 2017-08-18 DIAGNOSIS — Z955 Presence of coronary angioplasty implant and graft: Secondary | ICD-10-CM

## 2017-08-18 DIAGNOSIS — N182 Chronic kidney disease, stage 2 (mild): Secondary | ICD-10-CM | POA: Diagnosis not present

## 2017-08-18 DIAGNOSIS — I82492 Acute embolism and thrombosis of other specified deep vein of left lower extremity: Secondary | ICD-10-CM | POA: Diagnosis present

## 2017-08-18 LAB — TSH: TSH: 1.063 u[IU]/mL (ref 0.350–4.500)

## 2017-08-18 LAB — CBC
HCT: 38 % (ref 36.0–46.0)
Hemoglobin: 12.3 g/dL (ref 12.0–15.0)
MCH: 29.1 pg (ref 26.0–34.0)
MCHC: 32.4 g/dL (ref 30.0–36.0)
MCV: 90 fL (ref 78.0–100.0)
PLATELETS: 327 10*3/uL (ref 150–400)
RBC: 4.22 MIL/uL (ref 3.87–5.11)
RDW: 12.5 % (ref 11.5–15.5)
WBC: 8.3 10*3/uL (ref 4.0–10.5)

## 2017-08-18 LAB — HEPATIC FUNCTION PANEL
ALBUMIN: 3.2 g/dL — AB (ref 3.5–5.0)
ALT: 9 U/L (ref 0–44)
AST: 14 U/L — ABNORMAL LOW (ref 15–41)
Alkaline Phosphatase: 76 U/L (ref 38–126)
BILIRUBIN DIRECT: 0.2 mg/dL (ref 0.0–0.2)
BILIRUBIN INDIRECT: 0.6 mg/dL (ref 0.3–0.9)
BILIRUBIN TOTAL: 0.8 mg/dL (ref 0.3–1.2)
Total Protein: 7.2 g/dL (ref 6.5–8.1)

## 2017-08-18 LAB — URINALYSIS, ROUTINE W REFLEX MICROSCOPIC
BILIRUBIN URINE: NEGATIVE
Glucose, UA: NEGATIVE mg/dL
Ketones, ur: NEGATIVE mg/dL
Leukocytes, UA: NEGATIVE
Nitrite: NEGATIVE
PROTEIN: NEGATIVE mg/dL
SPECIFIC GRAVITY, URINE: 1.005 (ref 1.005–1.030)
pH: 5 (ref 5.0–8.0)

## 2017-08-18 LAB — BASIC METABOLIC PANEL
Anion gap: 12 (ref 5–15)
BUN: 15 mg/dL (ref 8–23)
CO2: 23 mmol/L (ref 22–32)
CREATININE: 1.42 mg/dL — AB (ref 0.44–1.00)
Calcium: 7 mg/dL — ABNORMAL LOW (ref 8.9–10.3)
Chloride: 106 mmol/L (ref 98–111)
GFR calc Af Amer: 42 mL/min — ABNORMAL LOW (ref >60)
GFR, EST NON AFRICAN AMERICAN: 36 mL/min — AB (ref >60)
Glucose, Bld: 162 mg/dL — ABNORMAL HIGH (ref 70–99)
Potassium: 3.7 mmol/L (ref 3.5–5.1)
SODIUM: 141 mmol/L (ref 135–145)

## 2017-08-18 LAB — C DIFFICILE QUICK SCREEN W PCR REFLEX
C DIFFICILE (CDIFF) INTERP: NOT DETECTED
C Diff antigen: NEGATIVE
C Diff toxin: NEGATIVE

## 2017-08-18 LAB — LIPASE, BLOOD: Lipase: 30 U/L (ref 11–51)

## 2017-08-18 LAB — TYPE AND SCREEN
ABO/RH(D): O POS
ANTIBODY SCREEN: NEGATIVE

## 2017-08-18 LAB — D-DIMER, QUANTITATIVE: D-Dimer, Quant: 1.08 ug/mL-FEU — ABNORMAL HIGH (ref 0.00–0.50)

## 2017-08-18 LAB — MAGNESIUM: Magnesium: 1 mg/dL — ABNORMAL LOW (ref 1.7–2.4)

## 2017-08-18 LAB — I-STAT TROPONIN, ED
TROPONIN I, POC: 0 ng/mL (ref 0.00–0.08)
Troponin i, poc: 0 ng/mL (ref 0.00–0.08)

## 2017-08-18 MED ORDER — ALPRAZOLAM 0.25 MG PO TABS
0.2500 mg | ORAL_TABLET | Freq: Three times a day (TID) | ORAL | Status: DC
  Administered 2017-08-18 – 2017-08-19 (×4): 0.25 mg via ORAL
  Filled 2017-08-18 (×4): qty 1

## 2017-08-18 MED ORDER — ASPIRIN EC 81 MG PO TBEC
81.0000 mg | DELAYED_RELEASE_TABLET | Freq: Every day | ORAL | Status: DC
  Administered 2017-08-19 – 2017-08-20 (×2): 81 mg via ORAL
  Filled 2017-08-18 (×2): qty 1

## 2017-08-18 MED ORDER — LACTATED RINGERS IV BOLUS
500.0000 mL | Freq: Once | INTRAVENOUS | Status: AC
  Administered 2017-08-18: 500 mL via INTRAVENOUS

## 2017-08-18 MED ORDER — DULOXETINE HCL 30 MG PO CPEP
30.0000 mg | ORAL_CAPSULE | Freq: Every day | ORAL | Status: DC
  Administered 2017-08-19 – 2017-08-20 (×2): 30 mg via ORAL
  Filled 2017-08-18 (×2): qty 1

## 2017-08-18 MED ORDER — POTASSIUM CHLORIDE CRYS ER 20 MEQ PO TBCR
20.0000 meq | EXTENDED_RELEASE_TABLET | Freq: Two times a day (BID) | ORAL | Status: DC
  Administered 2017-08-18 – 2017-08-20 (×4): 20 meq via ORAL
  Filled 2017-08-18 (×4): qty 1

## 2017-08-18 MED ORDER — IOPAMIDOL (ISOVUE-370) INJECTION 76%
INTRAVENOUS | Status: AC
  Filled 2017-08-18: qty 100

## 2017-08-18 MED ORDER — SODIUM CHLORIDE 0.9 % IV SOLN
INTRAVENOUS | Status: DC
  Administered 2017-08-18 – 2017-08-20 (×5): via INTRAVENOUS

## 2017-08-18 MED ORDER — LEVOTHYROXINE SODIUM 112 MCG PO TABS
112.0000 ug | ORAL_TABLET | Freq: Every day | ORAL | Status: DC
  Administered 2017-08-19 – 2017-08-20 (×2): 112 ug via ORAL
  Filled 2017-08-18 (×2): qty 1

## 2017-08-18 MED ORDER — CALCITRIOL 0.25 MCG PO CAPS
0.2500 ug | ORAL_CAPSULE | Freq: Every day | ORAL | Status: DC
  Administered 2017-08-19 – 2017-08-20 (×2): 0.25 ug via ORAL
  Filled 2017-08-18 (×2): qty 1

## 2017-08-18 MED ORDER — LACTATED RINGERS IV BOLUS
250.0000 mL | Freq: Once | INTRAVENOUS | Status: AC
  Administered 2017-08-18: 250 mL via INTRAVENOUS

## 2017-08-18 MED ORDER — FERROUS SULFATE 325 (65 FE) MG PO TABS
325.0000 mg | ORAL_TABLET | Freq: Every day | ORAL | Status: DC
  Administered 2017-08-19 – 2017-08-20 (×2): 325 mg via ORAL
  Filled 2017-08-18 (×2): qty 1

## 2017-08-18 MED ORDER — APIXABAN 5 MG PO TABS
10.0000 mg | ORAL_TABLET | Freq: Two times a day (BID) | ORAL | Status: DC
  Administered 2017-08-18 – 2017-08-20 (×4): 10 mg via ORAL
  Filled 2017-08-18 (×4): qty 2

## 2017-08-18 MED ORDER — SODIUM CHLORIDE 0.9 % IV SOLN
1.0000 g | Freq: Once | INTRAVENOUS | Status: AC
  Administered 2017-08-18: 1 g via INTRAVENOUS
  Filled 2017-08-18: qty 10

## 2017-08-18 MED ORDER — MAGNESIUM OXIDE 400 (241.3 MG) MG PO TABS
400.0000 mg | ORAL_TABLET | Freq: Every day | ORAL | Status: DC
  Administered 2017-08-19: 400 mg via ORAL
  Filled 2017-08-18: qty 1

## 2017-08-18 MED ORDER — ISOSORBIDE MONONITRATE ER 30 MG PO TB24
90.0000 mg | ORAL_TABLET | Freq: Every day | ORAL | Status: DC
  Administered 2017-08-19: 90 mg via ORAL
  Filled 2017-08-18: qty 1

## 2017-08-18 MED ORDER — HEPARIN SODIUM (PORCINE) 5000 UNIT/ML IJ SOLN: 5000.0000 [IU] | Freq: Three times a day (TID) | INTRAMUSCULAR | Status: DC

## 2017-08-18 MED ORDER — IOPAMIDOL (ISOVUE-370) INJECTION 76%
100.0000 mL | Freq: Once | INTRAVENOUS | Status: AC | PRN
  Administered 2017-08-18: 100 mL via INTRAVENOUS

## 2017-08-18 MED ORDER — CLOPIDOGREL BISULFATE 75 MG PO TABS
75.0000 mg | ORAL_TABLET | Freq: Every day | ORAL | Status: DC
  Administered 2017-08-19 – 2017-08-20 (×2): 75 mg via ORAL
  Filled 2017-08-18 (×2): qty 1

## 2017-08-18 MED ORDER — LEVETIRACETAM 500 MG PO TABS
500.0000 mg | ORAL_TABLET | Freq: Two times a day (BID) | ORAL | Status: DC
  Administered 2017-08-18 – 2017-08-20 (×4): 500 mg via ORAL
  Filled 2017-08-18 (×4): qty 1

## 2017-08-18 MED ORDER — ONDANSETRON HCL 4 MG/2ML IJ SOLN
4.0000 mg | Freq: Four times a day (QID) | INTRAMUSCULAR | Status: DC | PRN
  Administered 2017-08-18: 4 mg via INTRAVENOUS
  Filled 2017-08-18: qty 2

## 2017-08-18 MED ORDER — PANTOPRAZOLE SODIUM 40 MG PO TBEC
40.0000 mg | DELAYED_RELEASE_TABLET | Freq: Two times a day (BID) | ORAL | Status: DC
  Administered 2017-08-18 – 2017-08-19 (×2): 40 mg via ORAL
  Filled 2017-08-18 (×2): qty 1

## 2017-08-18 MED ORDER — ROSUVASTATIN CALCIUM 20 MG PO TABS
20.0000 mg | ORAL_TABLET | Freq: Every morning | ORAL | Status: DC
  Administered 2017-08-19 – 2017-08-20 (×2): 20 mg via ORAL
  Filled 2017-08-18 (×2): qty 1

## 2017-08-18 NOTE — ED Provider Notes (Signed)
[facility] EMERGENCY DEPARTMENT Provider Note   CSN: [id number] Arrival date & time: 08/18/17  1132   History   Chief Complaint Chief Complaint  Patient presents with  . Weakness    HPI [name] is a 72 y.o. female.  HPI Patient is a 72 year old female with complicated past medical history including CAD, chronic chest pain, chronic shortness of breath, pulm fibrosis, aortic stenosis, hypertension, hyperlipidemia, CKD.  She presents to the emergency department today from cardiology clinic for evaluation of fatigue, anorexia, generalized weakness, and was orthostatic at cardiology clinic.  Patient reports for the past 2 or 3 weeks she has been feeling worsening fatigue.  States that food "runs right through me".  States that she has been having diarrhea.  Stools often black though does take iron supplementation.  Occasionally has abdominal pain though not right now.  States she has not had any appetite and has not wanted to eat or drink.  Had approximately 30 point systolic blood pressure drop from lying to sitting in cardiology clinic. No fevers but has had chills. Chronic chest pain and SOA. No vomiting. No dysuria.   Past Medical History:  Diagnosis Date  . Anxiety   . Arthritis   . Carpal tunnel syndrome, bilateral 07/31/2015  . Cataract   . Cervical spondylosis without myelopathy   . Chronic chest pain   . Chronic kidney disease   . Chronic low back pain   . Chronotropic incompetence   . CKD (chronic kidney disease), stage III ([place])   . Coronary artery disease    a. inf-post MI 2011 s/p DES to RCA. b. DES to RCA 06/2010. c. DES to LAD 2014, residual dz treated medically.  . Depression   . Esophageal stricture   . GERD (gastroesophageal reflux disease)   . Gross hematuria   . History of esophageal dilatation    FOR STRIUCTURE  . History of gout   . History of hiatal hernia   . History of kidney stones   . Hyperlipidemia   . Hypertension   .  Hypothyroidism   . Internal hemorrhoids   . Irritable bowel syndrome   . Mild aortic stenosis    by echo 2018  . Obesity   . Orthostatic hypotension   . Partial seizure disorder (HCC) NEUROLOGIST-- DR WILLIS   NOCTURNAL  . PONV (postoperative nausea and vomiting)    hard time getting iv site-had to do neck stick 2 yrs ago  . Pulmonary fibrosis ([place])   . S/P pericardial cyst excision    02-05-2013  benign  . Seizures ([place])    none in yrs  . SVT (supraventricular tachycardia) ([place])   . Trigger finger, acquired 09/30/2015   Right middle finger  . Vitamin D deficiency     Patient Active Problem List   Diagnosis Date Noted  . Orthostatic hypotension 08/18/2017  . Mild aortic stenosis   . Trigger finger, acquired 09/30/2015  . Carpal tunnel syndrome, bilateral 07/31/2015  . Abnormal CAT scan 05/04/2015  . Breathlessness on exertion 05/04/2015  . Anemia in chronic kidney disease (CODE) 04/29/2015  . Bone disease, metabolic 83/15/1761  . Microscopic hematuria 04/29/2015  . Avitaminosis D 04/29/2015  . Normocytic anemia 03/01/2015  . Syncope due to orthostatic hypotension 03/01/2015  . Dehydration 03/01/2015  . Hypomagnesemia 03/01/2015  . Generalized weakness 02/23/2015  . Hypokalemia 02/23/2015  . AKI (acute kidney injury) ([place]) 02/23/2015  . Acute bronchitis 02/23/2015  . Hypocalcemia 02/23/2015  . ILD (interstitial  lung disease) ([place]) 01/24/2015  . Chronic respiratory failure ([place]) 12/26/2014  . Pulmonary fibrosis ([place]) 12/26/2014  . Dyspnea and respiratory abnormality 11/22/2014  . Abdominal pain 10/15/2014  . Pain in the chest   . RUQ abdominal pain   . Shortness of breath 10/14/2014  . Odynophagia   . History of esophageal stricture   . Esophageal dysmotility   . Renal insufficiency 02/14/2014  . Acute renal failure ([place]) 02/14/2014  . Nephrolithiasis 02/13/2014  . Gross hematuria 02/13/2014  . Convulsions/seizures ([place]) 07/26/2013  . Pericardial cyst   .  Mediastinal mass 02/05/2013  . Cervical spondylosis 11/17/2011  . Chest pain 11/16/2011  . Hypertension   . Hyperlipidemia   . Hypothyroidism   . History of  seizures   . Depression   . Carpal tunnel syndrome   . Paroxysmal SVT (supraventricular tachycardia) ([place]) 01/02/2011  . Coronary atherosclerosis   . ANEMIA-UNSPECIFIED 12/12/2009  . History of drug coated stent 12/01/2009  . GERD 01/19/2008  . IBS 01/19/2008    Past Surgical History:  Procedure Laterality Date  . BIOPSY OF MEDIASTINAL MASS N/A 02/05/2013   Procedure: RESECTION OF MEDIASTINAL MASS;  Surgeon: Ivin Poot, MD;  Location: [facility];  Service: Thoracic;  Laterality: N/A;  . CARDIAC CATHETERIZATION Right 01/24/2015   Procedure: right femoral ARTERIAL LINE INSERTION;  Surgeon: Ivin Poot, MD;  Location: [facility];  Service: Thoracic;  Laterality: Right;  . CARDIOVASCULAR STRESS TEST  11-22-2013  dr Irish Lack   normal lexiscan study/  no ischemia/  normal LVF and wall motion/ ef 81%  . COLONOSCOPY  last one 2014  . CORONARY ANGIOGRAM  03/10/2011   Procedure: CORONARY ANGIOGRAM;  Surgeon: Jettie Booze, MD;  Location: [facility] CATH LAB;  Service: Cardiovascular;;  . CORONARY ANGIOPLASTY WITH STENT PLACEMENT  11-22-2009  dr Irish Lack   Acute inferoposterior MI/  ef 60%,  PCI with DES x1 to dRCA,  25%  mLAD  . CORONARY ANGIOPLASTY WITH STENT PLACEMENT  06-26-2010  dr Daneen Schick   Cutting balloon angioplasty to dRCA with DES x1,  40% mLAD (non-obstructive CAD)  . CYSTOSCOPY WITH RETROGRADE PYELOGRAM, URETEROSCOPY AND STENT PLACEMENT Right 02/13/2014   Procedure: CYSTOSCOPY WITH RETROGRADE PYELOGRAM, URETEROSCOPY AND STENT PLACEMENT;  Surgeon: Bernestine Amass, MD;  Location: [facility];  Service: Urology;  Laterality: Right;  . ECTOPIC PREGNANCY SURGERY  YRS AGO   SALPINGECTOMY  . ESOPHAGOGASTRODUODENOSCOPY N/A 02/15/2014   Procedure: ESOPHAGOGASTRODUODENOSCOPY (EGD);  Surgeon: Jerene Bears, MD;  Location: [facility];  Service: Endoscopy;  Laterality: N/A;  . ESOPHAGOGASTRODUODENOSCOPY (EGD) WITH ESOPHAGEAL DILATION  05-20-2011  . HOLMIUM LASER APPLICATION Right 06/18/5914   Procedure: HOLMIUM LASER APPLICATION;  Surgeon: Bernestine Amass, MD;  Location: [facility];  Service: Urology;  Laterality: Right;  . LEFT HEART CATHETERIZATION WITH CORONARY ANGIOGRAM N/A 03/14/2012   Procedure: LEFT HEART CATHETERIZATION WITH CORONARY ANGIOGRAM;  Surgeon: Sueanne Margarita, MD;  Location: [facility] CATH LAB;  Service: Cardiovascular;  Laterality: N/A;  Normal LM,  50% pLAD,  70% mLAD,  D2 50-70%, very tortuous LAD,  70-82% ostial LCFX,  50% in-stent restenosis of  dRCA and mRCA  stent and 50-70% ostial PDA,  normal LVSF, ef 60%  . LUNG BIOPSY Right 01/24/2015   Procedure: RIGHT LUNG BIOPSY;  Surgeon: Ivin Poot, MD;  Location: [facility];  Service: Thoracic;  Laterality: Right;  . MEDIASTERNOTOMY N/A 02/05/2013   Procedure: MEDIAN STERNOTOMY;  Surgeon: Ivin Poot, MD;  Location: [facility] OR;  Service: Thoracic;  Laterality: N/A;  . MEDIASTERNOTOMY Left 02/05/2013  . NEEDLE GUIDED EXCISION BREAST CALCIFICATIONS Right 08-09-2008  . PERCUTANEOUS CORONARY STENT INTERVENTION (PCI-S) N/A 04/03/2012   Procedure: PERCUTANEOUS CORONARY STENT INTERVENTION (PCI-S);  Surgeon: Jettie Booze, MD;  Location: [facility] CATH LAB;  Service: Cardiovascular;  Laterality: N/A;   Successful PCI  mLAD with 2.75x12 Promus stent, postdilated to >0.37mm  . THORACOTOMY Right 01/24/2015   Procedure: RIGHT MINI/LIMITED THORACOTOMY;  Surgeon: Ivin Poot, MD;  Location: [facility];  Service: Thoracic;  Laterality: Right;  . TRANSTHORACIC ECHOCARDIOGRAM  11-17-2011   grade I diastolic dysfunction/  ef 55-60%  . VIDEO BRONCHOSCOPY N/A 01/24/2015   Procedure: RIGHT VIDEO BRONCHOSCOPY;  Surgeon: Ivin Poot, MD;  [facility] OR;  Service: Thoracic;  Laterality: N/A;     OB History   None      Home Medications    Prior to Admission  medications   Medication Sig Start Date End Date Taking? Authorizing Provider  ALPRAZolam (XANAX) 0.25 MG tablet Take 0.25 mg by mouth 3 (three) times daily.    Yes [provider]  aspirin EC 81 MG tablet Take 81 mg by mouth daily.   Yes [provider]  BYSTOLIC 5 MG tablet TAKE 1 TABLET (5 MG TOTAL) BY MOUTH DAILY. 04/18/17  Yes Turner, Eber Hong, MD  calcitRIOL (ROCALTROL) 0.25 MCG capsule Take 0.25 mcg by mouth daily.    Yes [provider]  clopidogrel (PLAVIX) 75 MG tablet TAKE 1 TABLET (75 MG TOTAL) BY MOUTH DAILY WITH BREAKFAST. 01/06/17  Yes Turner, Eber Hong, MD  diltiazem (CARDIZEM CD) 240 MG 24 hr capsule Take 1 capsule (240 mg total) by mouth daily. 04/05/17  Yes Allred, Jeneen Rinks, MD  DULoxetine (CYMBALTA) 30 MG capsule Take 30 mg by mouth daily.    Yes [provider]  ferrous sulfate 325 (65 FE) MG tablet Take 325 mg by mouth daily with breakfast.    Yes [provider]  furosemide (LASIX) 40 MG tablet TAKE 1 TABLET BY MOUTH EVERY OTHER DAY 02/21/17  Yes Turner, Eber Hong, MD  HYDROcodone-acetaminophen (NORCO) 10-325 MG tablet Take 1 tablet by mouth every 6 (six) hours as needed.   Yes [provider]  isosorbide mononitrate (IMDUR) 60 MG 24 hr tablet Take 90 mg by mouth daily.   Yes [provider]  levETIRAcetam (KEPPRA) 500 MG tablet TAKE 1 TABLET BY MOUTH TWICE A DAY 07/04/17  Yes Millikan, Megan, NP  levothyroxine (SYNTHROID, LEVOTHROID) 112 MCG tablet Take 112 mcg by mouth daily. 06/25/15  Yes [provider]  losartan (COZAAR) 50 MG tablet Take 50 mg daily by mouth.   Yes [provider]  Magnesium Oxide 400 (240 Mg) MG TABS Take 400 mg by mouth daily.    Yes [provider]  nitroGLYCERIN (NITROSTAT) 0.4 MG SL tablet PLACE 1 TABLET (0.4 MG TOTAL) UNDER THE TONGUE EVERY 5 (FIVE) MINUTES AS NEEDED FOR CHEST PAIN. 02/14/17  Yes Turner, Traci R, MD  pantoprazole (PROTONIX) 40 MG tablet TAKE 1 TABLET (40 MG  TOTAL) BY MOUTH 2 (TWO) TIMES DAILY. 04/04/17  Yes Irene Shipper, MD  potassium chloride SA (K-DUR,KLOR-CON) 20 MEQ tablet Take 20 mEq by mouth 2 (two) times daily.    Yes [provider]  rosuvastatin (CRESTOR) 20 MG tablet TAKE 1 TABLET (20 MG TOTAL) BY MOUTH EVERY MORNING. Patient taking differently: Take 20 mg by mouth every evening.  01/31/17  Yes  Sueanne Margarita, MD  isosorbide mononitrate (IMDUR) 120 MG 24 hr tablet Take 1 tablet (120 mg total) by mouth daily. Patient not taking: Reported on 08/18/2017 08/08/17   Sueanne Margarita, MD    Family History Family History  Problem Relation Age of Onset  . Breast cancer Sister   . Heart disease Father   . Hypertension Father   . Emphysema Father   . Coronary artery disease Father   . Diabetes Mellitus I Father   . Heart attack Mother   . CVA Mother   . Breast cancer Unknown        niece  . Colon cancer Neg Hx   . Stomach cancer Neg Hx   . Esophageal cancer Neg Hx   . Rectal cancer Neg Hx     Social History Social History   Tobacco Use  . Smoking status: Former Smoker    Packs/day: 0.30    Years: 3.00    Pack years: 0.90    Types: Cigarettes    Start date: 11/08/1964    Last attempt to quit: 11/09/1967    Years since quitting: 49.8  . Smokeless tobacco: Never Used  Substance Use Topics  . Alcohol use: No    Alcohol/week: 0.0 standard drinks  . Drug use: No     Allergies   Patient has no known allergies.   Review of Systems Review of Systems  Constitutional: Positive for chills and fatigue. Negative for fever.  HENT: Negative for congestion and sore throat.   Eyes: Negative for visual disturbance.  Respiratory: Positive for shortness of breath. Negative for cough.   Cardiovascular: Positive for chest pain and leg swelling.  Gastrointestinal: Positive for diarrhea. Negative for abdominal pain, nausea and vomiting.       Black stools  Genitourinary: Negative for dysuria and hematuria.  Musculoskeletal:  Negative for back pain and neck pain.  Skin: Negative for color change and rash.  Neurological: Positive for weakness ("all over") and light-headedness. Negative for headaches.  All other systems reviewed and are negative.  Physical Exam Updated Vital Signs BP 120/71   Pulse 84   Temp 98 F (36.7 C) (Oral)   Resp (!) 28   SpO2 96%   Physical Exam  Constitutional: She appears well-developed and well-nourished. No distress.  HENT:  Head: Normocephalic and atraumatic.  Dry mucous membranes  Eyes: Conjunctivae are normal.  Neck: Neck supple.  Cardiovascular: Regular rhythm and intact distal pulses.  Murmur (3/6 SEM RUSB) heard. Pulmonary/Chest: Effort normal and breath sounds normal. No respiratory distress.  Abdominal: Soft. She exhibits no distension. There is no tenderness. There is no rebound and no guarding.  Musculoskeletal: She exhibits edema (1+).  Neurological: She is alert.  Skin: Skin is warm and dry. Capillary refill takes 2 to 3 seconds.  Psychiatric: She has a normal mood and affect.  Nursing note and vitals reviewed.    ED Treatments / Results  Labs (all labs ordered are listed, but only abnormal results are displayed) Labs Reviewed  BASIC METABOLIC PANEL - Abnormal; Notable for the following components:      Result Value   Glucose, Bld 162 (*)    Creatinine, Ser 1.42 (*)    Calcium 7.0 (*)    GFR calc non Af Amer 36 (*)    GFR calc Af Amer 42 (*)    All other components within normal limits  URINALYSIS, ROUTINE W REFLEX MICROSCOPIC - Abnormal; Notable for the following components:   Color, Urine  STRAW (*)    Hgb urine dipstick MODERATE (*)    Bacteria, UA RARE (*)    All other components within normal limits  HEPATIC FUNCTION PANEL - Abnormal; Notable for the following components:   Albumin 3.2 (*)    AST 14 (*)    All other components within normal limits  D-DIMER, QUANTITATIVE (NOT AT [facility]) - Abnormal; Notable for the following components:    D-Dimer, Quant 1.08 (*)    All other components within normal limits  CBC  LIPASE, BLOOD  TSH  I-STAT TROPONIN, ED  I-STAT TROPONIN, ED  TYPE AND SCREEN    EKG EKG Interpretation  Date/Time:  Thursday August 18 2017 11:40:48 EDT Ventricular Rate:  89 PR Interval:  152 QRS Duration: 72 QT Interval:  378 QTC Calculation: 459 R Axis:   -61 Text Interpretation:  Normal sinus rhythm Left axis deviation Low voltage QRS Possible Anterolateral infarct , age undetermined Abnormal ECG Confirmed by Quintella Reichert [phone]) on 08/18/2017 12:38:51 PM   Radiology Dg Chest Portable 1 View  Result Date: 08/18/2017 CLINICAL DATA:  Lightheadedness today, syncope last week. EXAM: PORTABLE CHEST 1 VIEW COMPARISON:  Chest x-ray dated 02/19/2016. FINDINGS: Heart size and mediastinal contours are stable. Median sternotomy wires appear intact and stable in alignment. Stable chronic elevation of the RIGHT hemidiaphragm. Lungs are clear. No pleural effusion or pneumothorax seen. No acute or suspicious osseous finding. IMPRESSION: No active disease.  No evidence of pneumonia or pulmonary edema. Electronically Signed   By: Franki Cabot M.D.   On: 08/18/2017 13:08    Procedures Procedures (including critical care time)  Medications Ordered in ED Medications  lactated ringers bolus 500 mL (500 mLs Intravenous New Bag/Given 08/18/17 1529)  lactated ringers bolus 250 mL (has no administration in time range)  lactated ringers bolus 500 mL (0 mLs Intravenous Stopped 08/18/17 1439)     Initial Impression / Assessment and Plan / ED Course  I have reviewed the triage vital signs and the nursing notes.  Pertinent labs & imaging results that were available during my care of the patient were reviewed by me and considered in my medical decision making (see chart for details).    Patient is a 72 year old female with comp gated past medical history including CAD, chronic chest pain, chronic shortness of breath,  interstitial lung disease, aortic stenosis, hypertension, hyperlipidemia, CKD.  Presents for fatigue, diarrhea, orthostasis, and anorexia.   Patient afebrile and hemodynamically stable presentation.  Reassuring abdominal exam.  No chest pain or shortness of breath.  Stable on room air.  Reports feeling miserable. Symptomatic orthostasis in ED with HOB position changes. Appears dry. IVFs given. Most recent echo with no evidence CHF. ECG rate 90s, NSR, LAFB, no ST elevation/depression. High risk for ACS given CAD. Will obtain delta trops. Leg swelling, CP, SOA. Dimer obtained and elevated. Will obtain CTA. History and exam not consistent with aortic dissection. Although pale, no significant anemia. Lower suspicion for GIB at this time. Dark stool likely related to iron supplementation. Urine with no evidence UTI. Metabolic panel with AKI and hypocalcemia. Will replenish calcium. Mag level ordered. Trop undetectable x2. Continues to have symptomatic orthostasis in ED. Anticipate discharge pending CTA chest. Care handed off to Dr. Drue Flirt at 1620 pending CTA PE study.   Case and plan of care discussed with Dr. Ralene Bathe.   Final Clinical Impressions(s) / ED Diagnoses   Final diagnoses:  AKI (acute kidney injury) ([place])  Diarrhea in adult patient  Fatigue, unspecified  type  Orthostatic hypotension    ED Discharge Orders    None       Corrie Dandy, MD 08/18/17 1622    Quintella Reichert, MD 08/24/17 [phone]

## 2017-08-18 NOTE — Patient Instructions (Signed)
GO THE EMERGENCY DEPARTMENT FOR ORTHOSTATIC BP SITTING BLOOD PRESSURE IN THE 80S; PATIENT IS PALE, POSSIBLE GI BLEEDING, SUSPECTED ANEMIC.

## 2017-08-18 NOTE — Progress Notes (Signed)
[facility] MD Willia Craze via amion with venous duplex results: Left leg DVT

## 2017-08-18 NOTE — ED Triage Notes (Signed)
Pt to ER sent from doctors office for weakness and fatigue, reports shortness of breath. States symptoms x2 weeks. Reports dark stools, however also reports takes daily iron. Pt is a/o x4. Pale on arrival, conjunctivae pale, patient reports at office she was +orthostatic, at this time sitting, 110/83.

## 2017-08-18 NOTE — Progress Notes (Addendum)
08/18/2017 [name]   1945-09-14  [id number]  Primary Physician Carol Ada, MD Primary Cardiologist: Dr Radford Pax  HPI:  72 y/o with multiple medical problems as outlined, seen in the office today by me for f/u from her OV 8/19 where Dr Radford Pax adjusted her medications for hypotension. The patient is miserable. She is pale, weak, anorexic. She has had black diarrhea for the past few days. She is on Iron supplements. She has no history of GI bleeding. She denies chest pain or SOB. In the office she is significantly orthostatic with a laying B/P of 120/ 72 and sitting B/P of 80 systolic, with dizziness.    Current Outpatient Medications  Medication Sig Dispense Refill  . ALPRAZolam (XANAX) 0.25 MG tablet Take 0.25 mg by mouth at bedtime.    Marland Kitchen aspirin EC 81 MG tablet Take 81 mg by mouth daily.    Marland Kitchen BYSTOLIC 5 MG tablet TAKE 1 TABLET (5 MG TOTAL) BY MOUTH DAILY. 90 tablet 3  . calcitRIOL (ROCALTROL) 0.5 MCG capsule Take 0.5 mcg by mouth daily.    . clopidogrel (PLAVIX) 75 MG tablet TAKE 1 TABLET (75 MG TOTAL) BY MOUTH DAILY WITH BREAKFAST. 90 tablet 3  . diltiazem (CARDIZEM CD) 240 MG 24 hr capsule Take 1 capsule (240 mg total) by mouth daily. 90 capsule 2  . DULoxetine (CYMBALTA) 20 MG capsule Take 20 mg by mouth daily.    . ferrous sulfate 325 (65 FE) MG tablet Take 325 mg by mouth daily with breakfast.     . furosemide (LASIX) 40 MG tablet TAKE 1 TABLET BY MOUTH EVERY OTHER DAY 15 tablet 10  . HYDROcodone-acetaminophen (NORCO) 10-325 MG tablet Take 1 tablet by mouth every 6 (six) hours as needed.    . isosorbide mononitrate (IMDUR) 120 MG 24 hr tablet Take 1 tablet (120 mg total) by mouth daily. 90 tablet 3  . levETIRAcetam (KEPPRA) 500 MG tablet TAKE 1 TABLET BY MOUTH TWICE A DAY 180 tablet 4  . levothyroxine (SYNTHROID, LEVOTHROID) 112 MCG tablet Take 112 mcg by mouth daily.  5  . losartan (COZAAR) 50 MG tablet Take 50 mg daily by mouth.    . Magnesium Oxide 200 MG TABS Take 200  mg by mouth daily.    . nitroGLYCERIN (NITROSTAT) 0.4 MG SL tablet PLACE 1 TABLET (0.4 MG TOTAL) UNDER THE TONGUE EVERY 5 (FIVE) MINUTES AS NEEDED FOR CHEST PAIN. 75 tablet 1  . pantoprazole (PROTONIX) 40 MG tablet TAKE 1 TABLET (40 MG TOTAL) BY MOUTH 2 (TWO) TIMES DAILY. 60 tablet 10  . potassium chloride SA (K-DUR,KLOR-CON) 20 MEQ tablet Take 40 mEq by mouth every other day.    . rosuvastatin (CRESTOR) 20 MG tablet TAKE 1 TABLET (20 MG TOTAL) BY MOUTH EVERY MORNING. 90 tablet 2   No current facility-administered medications for this visit.     No Known Allergies  Past Medical History:  Diagnosis Date  . Anxiety   . Arthritis   . Carpal tunnel syndrome, bilateral 07/31/2015  . Cataract   . Cervical spondylosis without myelopathy   . Chronic chest pain   . Chronic kidney disease   . Chronic low back pain   . Chronotropic incompetence   . CKD (chronic kidney disease), stage III ([place])   . Coronary artery disease    a. inf-post MI 2011 s/p DES to RCA. b. DES to RCA 06/2010. c. DES to LAD 2014, residual dz treated medically.  . Depression   . Esophageal  stricture   . GERD (gastroesophageal reflux disease)   . Gross hematuria   . History of esophageal dilatation    FOR STRIUCTURE  . History of gout   . History of hiatal hernia   . History of kidney stones   . Hyperlipidemia   . Hypertension   . Hypothyroidism   . Internal hemorrhoids   . Irritable bowel syndrome   . Mild aortic stenosis    by echo 2018  . Obesity   . Orthostatic hypotension   . Partial seizure disorder (HCC) NEUROLOGIST-- DR WILLIS   NOCTURNAL  . PONV (postoperative nausea and vomiting)    hard time getting iv site-had to do neck stick 2 yrs ago  . Pulmonary fibrosis ([place])   . S/P pericardial cyst excision    02-05-2013  benign  . Seizures ([place])    none in yrs  . SVT (supraventricular tachycardia) ([place])   . Trigger finger, acquired 09/30/2015   Right middle finger  . Vitamin D deficiency     Social  History   Socioeconomic History  . Marital status: Married    Spouse name: Fritz Pickerel  . Number of children: 0  . Years of education: hs  . Highest education level: Not on file  Occupational History  . Occupation: Retired    Fish farm manager: RETIRED  Social Needs  . Financial resource strain: Not on file  . Food insecurity:    Worry: Not on file    Inability: Not on file  . Transportation needs:    Medical: Not on file    Non-medical: Not on file  Tobacco Use  . Smoking status: Former Smoker    Packs/day: 0.30    Years: 3.00    Pack years: 0.90    Types: Cigarettes    Start date: 11/08/1964    Last attempt to quit: 11/09/1967    Years since quitting: 49.8  . Smokeless tobacco: Never Used  Substance and Sexual Activity  . Alcohol use: No    Alcohol/week: 0.0 standard drinks  . Drug use: No  . Sexual activity: Not on file  Lifestyle  . Physical activity:    Days per week: Not on file    Minutes per session: Not on file  . Stress: Not on file  Relationships  . Social connections:    Talks on phone: Not on file    Gets together: Not on file    Attends religious service: Not on file    Active member of club or organization: Not on file    Attends meetings of clubs or organizations: Not on file    Relationship status: Not on file  . Intimate partner violence:    Fear of current or ex partner: Not on file    Emotionally abused: Not on file    Physically abused: Not on file    Forced sexual activity: Not on file  Other Topics Concern  . Not on file  Social History Narrative   Lives at home, married   Left-handed   Daily caffeine use: coffee.     Family History  Problem Relation Age of Onset  . Breast cancer Sister   . Heart disease Father   . Hypertension Father   . Emphysema Father   . Coronary artery disease Father   . Diabetes Mellitus I Father   . Heart attack Mother   . CVA Mother   . Breast cancer Unknown        niece  . Colon cancer  Neg Hx   . Stomach  cancer Neg Hx   . Esophageal cancer Neg Hx   . Rectal cancer Neg Hx      Review of Systems: General: negative fever, night sweats or weight changes.  Cardiovascular: negative for chest pain, edema, orthopnea, palpitations, paroxysmal nocturnal dyspnea Dermatological: negative for rash Respiratory: negative for cough or wheezing Urologic: negative for hematuria Abdominal: negative for vomiting, positive for diarhhea Neurologic: negative for visual changes, positive for near syncope All other systems reviewed and are otherwise negative except as noted above.    Blood pressure 102/72, pulse 96, height 4\' 11"  (1.499 m), weight 142 lb (64.4 kg), SpO2 95 %.  General appearance: alert, cooperative, no distress, mildly obese and pale Lungs: clear to auscultation bilaterally Heart: regular rate and rhythm Abdomen: obese, not distended, BS present Extremities: no edema Skin: pale, cool, dry Neurologic: Grossly normal   ASSESSMENT AND PLAN:   orthostatic hypotension I'm concerned Ms Kotowski is significantly anemic or dehydrated and I suggested she go to the ED via EMS. They refused, they want to take her by car. B/P 960/45 flat, 80 systolic sitting up with dizziness.   ASCAD - s/p inf-post MI 2011 s/p DES to RCA. Recurrent CP s/p DES to RCA 06/2010 and then DES to LAD 2014 with residual dz treated medically.  On ASA and Plavix.   HTN- Amlodipine and Doxazosin stopped LOV 08/11/17- but she is still on Lasix, Losartan, Bystolic, and Diltiazem  H/O PSVT she has had no further recurrence on Cardizem CD 240 mg daily as well as Bystolic 5 mg daily.  Mild AS -echo 03/2016 showed normal LV function with EF 60 to 65% with very mild aortic stenosis.   PLAN  As noted I suggested she go to the ED via EMS but they declined and we escorted the patient and her husband to their car in a wheel chair. They will go directly to the ED. We have attempted to contact the Triage RN but so far have been  unsuccessful (line busy or she has been unable to take a call).   [name] PA-C 08/18/2017 11:08 AM

## 2017-08-18 NOTE — Progress Notes (Signed)
Preliminary notes--Bilateral lower extremities venous duplex exam completed. Acute positive deep vein thrombosis involving left Peroneal veins mid to distal segment.  Result notified RN Gerraldine.  Hongying Landry Mellow (RDMS RVT) 08/18/17 6:32 PM

## 2017-08-18 NOTE — ED Notes (Signed)
Portable xray at bedside.

## 2017-08-18 NOTE — H&P (Signed)
History and Physical    [name] TGY:[id number] DOB: December 22, 1945 DOA: 08/18/2017  PCP: Carol Ada, MD   Patient coming from: Home    Chief Complaint: weakness, diarrhea, dizziness  HPI: [name] is a 72 y.o. female with medical history significant of coronary artery disease, pulmonary fibrosis, CKD stage II-III, hypothyroidism, hypertension, hyperlipidemia who presented to the emergency department after she was sent from her cardiologist's office for the evaluation of orthostatic hypotension.  Apparently her systolic blood pressure dropped from 120 mmHg to 80 mmHg while she was moved from supine to sitting position.  Patient was complaining of dizziness and lightheadedness since last 2 weeks.  Patient reports of having diarrhea for the same time.  Every time she goes to the bathroom for urination she has a bowel movement which is watery.  And says she has severe loss of appetite and does not want to eat anything.  She denies any abdominal pain.  Complains of nausea but no vomiting.  Denies any fever or chills.  Denies any shortness of breath or cough.  ED Course: Patient was found to be orthostatic on presentation.  D-dimer was mildly elevated.  She is undergoing CTA chest and venous duplex.  [facility] hospitalist was called for admission for further hypotension.  Review of Systems: As per HPI otherwise 10 point review of systems negative.    Past Medical History:  Diagnosis Date  . Anxiety   . Arthritis   . Carpal tunnel syndrome, bilateral 07/31/2015  . Cataract   . Cervical spondylosis without myelopathy   . Chronic chest pain   . Chronic kidney disease   . Chronic low back pain   . Chronotropic incompetence   . CKD (chronic kidney disease), stage III ([place])   . Coronary artery disease    a. inf-post MI 2011 s/p DES to RCA. b. DES to RCA 06/2010. c. DES to LAD 2014, residual dz treated medically.  . Depression   . Esophageal stricture   . GERD (gastroesophageal reflux  disease)   . Gross hematuria   . History of esophageal dilatation    FOR STRIUCTURE  . History of gout   . History of hiatal hernia   . History of kidney stones   . Hyperlipidemia   . Hypertension   . Hypothyroidism   . Internal hemorrhoids   . Irritable bowel syndrome   . Mild aortic stenosis    by echo 2018  . Obesity   . Orthostatic hypotension   . Partial seizure disorder (HCC) NEUROLOGIST-- DR WILLIS   NOCTURNAL  . PONV (postoperative nausea and vomiting)    hard time getting iv site-had to do neck stick 2 yrs ago  . Pulmonary fibrosis ([place])   . S/P pericardial cyst excision    02-05-2013  benign  . Seizures ([place])    none in yrs  . SVT (supraventricular tachycardia) ([place])   . Trigger finger, acquired 09/30/2015   Right middle finger  . Vitamin D deficiency     Past Surgical History:  Procedure Laterality Date  . BIOPSY OF MEDIASTINAL MASS N/A 02/05/2013   Procedure: RESECTION OF MEDIASTINAL MASS;  Surgeon: Ivin Poot, MD;  Location: [place] [facility];  Service: Thoracic;  Laterality: N/A;  . CARDIAC CATHETERIZATION Right 01/24/2015   Procedure: right femoral ARTERIAL LINE INSERTION;  Surgeon: Ivin Poot, MD;  Location: [place];  Service: Thoracic;  Laterality: Right;  . CARDIOVASCULAR STRESS TEST  11-22-2013  dr Irish Lack   normal lexiscan study/  no ischemia/  normal LVF and wall motion/ ef 81%  . COLONOSCOPY  last one 2014  . CORONARY ANGIOGRAM  03/10/2011   Procedure: CORONARY ANGIOGRAM;  Surgeon: Jettie Booze, MD;  Location: [facility] CATH LAB;  Service: Cardiovascular;;  . CORONARY ANGIOPLASTY WITH STENT PLACEMENT  11-22-2009  dr Irish Lack   Acute inferoposterior MI/  ef 60%,  PCI with DES x1 to dRCA,  25%  mLAD  . CORONARY ANGIOPLASTY WITH STENT PLACEMENT  06-26-2010  dr Daneen Schick   Cutting balloon angioplasty to dRCA with DES x1,  40% mLAD (non-obstructive CAD)  . CYSTOSCOPY WITH RETROGRADE PYELOGRAM, URETEROSCOPY AND STENT PLACEMENT Right 02/13/2014   Procedure:  CYSTOSCOPY WITH RETROGRADE PYELOGRAM, URETEROSCOPY AND STENT PLACEMENT;  Surgeon: Bernestine Amass, MD;  Location: [facility];  Service: Urology;  Laterality: Right;  . ECTOPIC PREGNANCY SURGERY  YRS AGO   SALPINGECTOMY  . ESOPHAGOGASTRODUODENOSCOPY N/A 02/15/2014   Procedure: ESOPHAGOGASTRODUODENOSCOPY (EGD);  Surgeon: Jerene Bears, MD;  Location: [facility];  Service: Endoscopy;  Laterality: N/A;  . ESOPHAGOGASTRODUODENOSCOPY (EGD) WITH ESOPHAGEAL DILATION  05-20-2011  . HOLMIUM LASER APPLICATION Right 09/14/8544   Procedure: HOLMIUM LASER APPLICATION;  Surgeon: Bernestine Amass, MD;  Location: [facility];  Service: Urology;  Laterality: Right;  . LEFT HEART CATHETERIZATION WITH CORONARY ANGIOGRAM N/A 03/14/2012   Procedure: LEFT HEART CATHETERIZATION WITH CORONARY ANGIOGRAM;  Surgeon: Sueanne Margarita, MD;  Location: [place] CATH LAB;  Service: Cardiovascular;  Laterality: N/A;  Normal LM,  50% pLAD,  70% mLAD,  D2 50-70%, very tortuous LAD,  70-82% ostial LCFX,  50% in-stent restenosis of  dRCA and mRCA  stent and 50-70% ostial PDA,  normal LVSF, ef 60%  . LUNG BIOPSY Right 01/24/2015   Procedure: RIGHT LUNG BIOPSY;  Surgeon: Ivin Poot, MD;  Location: [place];  Service: Thoracic;  Laterality: Right;  . MEDIASTERNOTOMY N/A 02/05/2013   Procedure: MEDIAN STERNOTOMY;  Surgeon: Ivin Poot, MD;  Location: [place];  Service: Thoracic;  Laterality: N/A;  . MEDIASTERNOTOMY Left 02/05/2013  . NEEDLE GUIDED EXCISION BREAST CALCIFICATIONS Right 08-09-2008  . PERCUTANEOUS CORONARY STENT INTERVENTION (PCI-S) N/A 04/03/2012   Procedure: PERCUTANEOUS CORONARY STENT INTERVENTION (PCI-S);  Surgeon: Jettie Booze, MD;  Location[facility] C[facility] LAB;  Service: Cardiovascular;  Laterality: N/A;   Successful PCI  mLAD with 2.75x12 Promus stent, postdilated to >0.45mm  . THORACOTOMY Right 01/24/2015   Procedure: RIGHT MINI/LIMITED THORACOTOMY;  Surgeon: Ivin Poot, MD;  Location: [place];   Service: Thoracic;  Laterality: Right;  . TRANSTHORACIC ECHOCARDIOGRAM  11-17-2011   grade I diastolic dysfunction/  ef 55-60%  . VIDEO BRONCHOSCOPY N/A 01/24/2015   Procedure: RIGHT VIDEO BRONCHOSCOPY;  Surgeon: Ivin Poot, MD;  Location: [place];  Service: Thoracic;  Laterality: N/A;     reports that she quit smoking about 49 years ago. Her smoking use included cigarettes. She started smoking about 52 years ago. She has a 0.90 pack-year smoking history. She has never used smokeless tobacco. She reports that she does not drink alcohol or use drugs.  No Known Allergies  Family History  Problem Relation Age of Onset  . Breast cancer Sister   . Heart disease Father   . Hypertension Father   . Emphysema Father   . Coronary artery disease Father   . Diabetes Mellitus I Father   . Heart attack Mother   . CVA Mother   . Breast cancer Unknown  niece  . Colon cancer Neg Hx   . Stomach cancer Neg Hx   . Esophageal cancer Neg Hx   . Rectal cancer Neg Hx      Prior to Admission medications   Medication Sig Start Date End Date Taking? Authorizing Provider  ALPRAZolam (XANAX) 0.25 MG tablet Take 0.25 mg by mouth 3 (three) times daily.    Yes [provider]  aspirin EC 81 MG tablet Take 81 mg by mouth daily.   Yes [provider]  BYSTOLIC 5 MG tablet TAKE 1 TABLET (5 MG TOTAL) BY MOUTH DAILY. 04/18/17  Yes Turner, Eber Hong, MD  calcitRIOL (ROCALTROL) 0.25 MCG capsule Take 0.25 mcg by mouth daily.    Yes [provider]  clopidogrel (PLAVIX) 75 MG tablet TAKE 1 TABLET (75 MG TOTAL) BY MOUTH DAILY WITH BREAKFAST. 01/06/17  Yes Turner, Eber Hong, MD  diltiazem (CARDIZEM CD) 240 MG 24 hr capsule Take 1 capsule (240 mg total) by mouth daily. 04/05/17  Yes Allred, Jeneen Rinks, MD  DULoxetine (CYMBALTA) 30 MG capsule Take 30 mg by mouth daily.    Yes [provider]  ferrous sulfate 325 (65 FE) MG tablet Take 325 mg by mouth daily with breakfast.    Yes [provider]  furosemide (LASIX) 40 MG tablet TAKE 1 TABLET BY MOUTH EVERY OTHER DAY 02/21/17  Yes Turner, Eber Hong, MD  HYDROcodone-acetaminophen (NORCO) 10-325 MG tablet Take 1 tablet by mouth every 6 (six) hours as needed.   Yes [provider]  isosorbide mononitrate (IMDUR) 60 MG 24 hr tablet Take 90 mg by mouth daily.   Yes [provider]  levETIRAcetam (KEPPRA) 500 MG tablet TAKE 1 TABLET BY MOUTH TWICE A DAY 07/04/17  Yes Millikan, Megan, NP  levothyroxine (SYNTHROID, LEVOTHROID) 112 MCG tablet Take 112 mcg by mouth daily. 06/25/15  Yes [provider]  losartan (COZAAR) 50 MG tablet Take 50 mg daily by mouth.   Yes [provider]  Magnesium Oxide 400 (240 Mg) MG TABS Take 400 mg by mouth daily.    Yes [provider]  nitroGLYCERIN (NITROSTAT) 0.4 MG SL tablet PLACE 1 TABLET (0.4 MG TOTAL) UNDER THE TONGUE EVERY 5 (FIVE) MINUTES AS NEEDED FOR CHEST PAIN. 02/14/17  Yes Turner, Traci R, MD  pantoprazole (PROTONIX) 40 MG tablet TAKE 1 TABLET (40 MG TOTAL) BY MOUTH 2 (TWO) TIMES DAILY. 04/04/17  Yes Irene Shipper, MD  potassium chloride SA (K-DUR,KLOR-CON) 20 MEQ tablet Take 20 mEq by mouth 2 (two) times daily.    Yes [provider]  rosuvastatin (CRESTOR) 20 MG tablet TAKE 1 TABLET (20 MG TOTAL) BY MOUTH EVERY MORNING. Patient taking differently: Take 20 mg by mouth every evening.  01/31/17  Yes Sueanne Margarita, MD    Physical Exam: Vitals:   08/18/17 1430 08/18/17 1500 08/18/17 1600 08/18/17 1630  BP: 115/70 120/71 111/74 111/69  Pulse: 78 84  80  Resp: 15 (!) 28  20  Temp:      TempSrc:      SpO2: 97% 96%  96%    Constitutional: Not in obvious distress, generalized weakness Vitals:   08/18/17 1430 08/18/17 1500 08/18/17 1600 08/18/17 1630  BP: 115/70 120/71 111/74 111/69  Pulse: 78 84  80  Resp: 15 (!) 28  20  Temp:      TempSrc:      SpO2: 97% 96%  96%   Eyes: PERRL, lids and conjunctivae normal ENMT: Mucous membranes  are dry, Posterior pharynx clear of any exudate or lesions.Normal dentition.  Neck: normal, supple, no masses, no thyromegaly Respiratory: clear to auscultation bilaterally, no wheezing, no crackles. Normal respiratory effort. No accessory muscle use.  Cardiovascular: Regular rate and rhythm, no murmurs / rubs / gallops. No extremity edema. 2+ pedal pulses. No carotid bruits.  Abdomen: no tenderness, no masses palpated. No hepatosplenomegaly. Bowel sounds positive.  Musculoskeletal: no clubbing / cyanosis. No joint deformity upper and lower extremities. Good ROM, no contractures. Normal muscle tone.  Skin: no rashes, lesions, ulcers. No induration Neurologic: CN 2-12 grossly intact. Sensation intact, DTR normal. Strength 5/5 in all 4.  Psychiatric: Normal judgment and insight. Alert and oriented x 3. Normal mood.   Foley Catheter:None  Labs on Admission: I have personally reviewed following labs and imaging studies  CBC: Recent Labs  Lab 08/18/17 1154  WBC 8.3  HGB 12.3  HCT 38.0  MCV 90.0  PLT 350   Basic Metabolic Panel: Recent Labs  Lab 08/18/17 1154  NA 141  K 3.7  CL 106  CO2 23  GLUCOSE 162*  BUN 15  CREATININE 1.42*  CALCIUM 7.0*   GFR: Estimated Creatinine Clearance: 29.7 mL/min (A) (by C-G formula based on SCr of 1.42 mg/dL (H)). Liver Function Tests: Recent Labs  Lab 08/18/17 1300  AST 14*  ALT 9  ALKPHOS 76  BILITOT 0.8  PROT 7.2  ALBUMIN 3.2*   Recent Labs  Lab 08/18/17 1300  LIPASE 30   No results for input(s): AMMONIA in the last 168 hours. Coagulation Profile: No results for input(s): INR, PROTIME in the last 168 hours. Cardiac Enzymes: No results for input(s): CKTOTAL, CKMB, CKMBINDEX, TROPONINI in the last 168 hours. BNP (last 3 results) No results for input(s): PROBNP in the last 8760 hours. HbA1C: No results for input(s): HGBA1C in the last 72 hours. CBG: No results for input(s): GLUCAP in the last 168 hours. Lipid Profile: No  results for input(s): CHOL, HDL, LDLCALC, TRIG, CHOLHDL, LDLDIRECT in the last 72 hours. Thyroid Function Tests: Recent Labs    08/18/17 1300  TSH 1.063   Anemia Panel: No results for input(s): VITAMINB12, FOLATE, FERRITIN, TIBC, IRON, RETICCTPCT in the last 72 hours. Urine analysis:    Component Value Date/Time   COLORURINE STRAW (A) 08/18/2017 [place] 08/18/2017 1528   LABSPEC 1.005 08/18/2017 1528   PHURINE 5.0 08/18/2017 1528   GLUCOSEU NEGATIVE 08/18/2017 1528   HGBUR MODERATE (A) 08/18/2017 1528   BILIRUBINUR NEGATIVE 08/18/2017 [place] 08/18/2017 1528   PROTEINUR NEGATIVE 08/18/2017 1528   UROBILINOGEN 0.2 02/14/2014 1643   NITRITE NEGATIVE 08/18/2017 1528   LEUKOCYTESUR NEGATIVE 08/18/2017 1528    Radiological Exams on Admission: Dg Chest Portable 1 View  Result Date: 08/18/2017 CLINICAL DATA:  Lightheadedness today, syncope last week. EXAM: PORTABLE CHEST 1 VIEW COMPARISON:  Chest x-ray dated 02/19/2016. FINDINGS: Heart size and mediastinal contours are stable. Median sternotomy wires appear intact and stable in alignment. Stable chronic elevation of the RIGHT hemidiaphragm. Lungs are clear. No pleural effusion or pneumothorax seen. No acute or suspicious osseous finding. IMPRESSION: No active disease.  No evidence of pneumonia or pulmonary edema. Electronically Signed   By: Franki Cabot M.D.   On: 08/18/2017 13:08     Assessment/Plan Principal Problem:   Orthostatic hypotension Active Problems:   Hypertension   Hyperlipidemia   History of drug coated stent   Hypothyroidism   History of  seizures  Depression   Pulmonary fibrosis (HCC)   Diarrhea  Orthostatic hypotension: Patient was complaining of dizziness and lightheadedness since last 2 weeks.  Patient was found to be profoundly orthostatic.  Will start on IV fluids.  We will continue to monitor orthostatic vitals.  We will hold Cardizem, Bystolic and Lasix which she was  taking at home.  Diarrhea: Has been going on for a while.  Will check C. difficile and GI pathogen panel.  She might need GI evaluation as an inpatient/outpatient if above  are negative.  Apparently patient became volume depleted and orthostatic due to chronic diarrhea.  Mild AKI on CKD stage II-III: Kidney function near baseline.  Continue IV fluids.  Follows with nephrology Dr. Olivia Mackie at [facility].  History of coronary disease: Status post inferior posterior MI in 2011.  She is status post stents x3.  Continue aspirin and Plavix.  History of PSVT: On Cardizem and Bystolic at home.  We will hold this medications for now due to orthostatic hypotension.  Hypothyroidism: Continue Synthyroid  Depression/anxiety: Continue her home meds  Elevated d-dimer: We will follow-up CT angiogram and venous  Duplex.  History of pulmonary fibrosis: Not on oxygen at home.  Currently stable  Seizure disorder: On Keppra at home which we will continue.  Deconditioning/debility/generalized weakness: We will request for PT evaluation.    Severity of Illness: The appropriate patient status for this patient is OBSERVATION.   DVT prophylaxis: Heparin [place] Code Status: Full Family Communication: Husband  Consults called: None     Shelly Coss MD [facility] Pager [phone]  If 7PM-7AM, please contact night-coverage [email or web address] Password [facility]  08/18/2017, 5:37 PM

## 2017-08-19 ENCOUNTER — Telehealth: Payer: Self-pay | Admitting: Cardiology

## 2017-08-19 ENCOUNTER — Encounter (HOSPITAL_COMMUNITY): Payer: PPO

## 2017-08-19 DIAGNOSIS — I82409 Acute embolism and thrombosis of unspecified deep veins of unspecified lower extremity: Secondary | ICD-10-CM

## 2017-08-19 DIAGNOSIS — F329 Major depressive disorder, single episode, unspecified: Secondary | ICD-10-CM | POA: Diagnosis present

## 2017-08-19 DIAGNOSIS — E876 Hypokalemia: Secondary | ICD-10-CM | POA: Diagnosis present

## 2017-08-19 DIAGNOSIS — R1310 Dysphagia, unspecified: Secondary | ICD-10-CM | POA: Diagnosis present

## 2017-08-19 DIAGNOSIS — I951 Orthostatic hypotension: Secondary | ICD-10-CM | POA: Diagnosis present

## 2017-08-19 DIAGNOSIS — I129 Hypertensive chronic kidney disease with stage 1 through stage 4 chronic kidney disease, or unspecified chronic kidney disease: Secondary | ICD-10-CM | POA: Diagnosis present

## 2017-08-19 DIAGNOSIS — I252 Old myocardial infarction: Secondary | ICD-10-CM | POA: Diagnosis not present

## 2017-08-19 DIAGNOSIS — N182 Chronic kidney disease, stage 2 (mild): Secondary | ICD-10-CM | POA: Diagnosis present

## 2017-08-19 DIAGNOSIS — Z7902 Long term (current) use of antithrombotics/antiplatelets: Secondary | ICD-10-CM | POA: Diagnosis not present

## 2017-08-19 DIAGNOSIS — Z823 Family history of stroke: Secondary | ICD-10-CM | POA: Diagnosis not present

## 2017-08-19 DIAGNOSIS — F419 Anxiety disorder, unspecified: Secondary | ICD-10-CM | POA: Diagnosis present

## 2017-08-19 DIAGNOSIS — R195 Other fecal abnormalities: Secondary | ICD-10-CM

## 2017-08-19 DIAGNOSIS — Z803 Family history of malignant neoplasm of breast: Secondary | ICD-10-CM | POA: Diagnosis not present

## 2017-08-19 DIAGNOSIS — N179 Acute kidney failure, unspecified: Secondary | ICD-10-CM | POA: Diagnosis present

## 2017-08-19 DIAGNOSIS — K921 Melena: Secondary | ICD-10-CM

## 2017-08-19 DIAGNOSIS — Z833 Family history of diabetes mellitus: Secondary | ICD-10-CM | POA: Diagnosis not present

## 2017-08-19 DIAGNOSIS — E039 Hypothyroidism, unspecified: Secondary | ICD-10-CM | POA: Diagnosis present

## 2017-08-19 DIAGNOSIS — Z7982 Long term (current) use of aspirin: Secondary | ICD-10-CM | POA: Diagnosis not present

## 2017-08-19 DIAGNOSIS — Z79890 Hormone replacement therapy (postmenopausal): Secondary | ICD-10-CM | POA: Diagnosis not present

## 2017-08-19 DIAGNOSIS — Z825 Family history of asthma and other chronic lower respiratory diseases: Secondary | ICD-10-CM | POA: Diagnosis not present

## 2017-08-19 DIAGNOSIS — K219 Gastro-esophageal reflux disease without esophagitis: Secondary | ICD-10-CM | POA: Diagnosis present

## 2017-08-19 DIAGNOSIS — I82492 Acute embolism and thrombosis of other specified deep vein of left lower extremity: Secondary | ICD-10-CM | POA: Diagnosis present

## 2017-08-19 DIAGNOSIS — E785 Hyperlipidemia, unspecified: Secondary | ICD-10-CM | POA: Diagnosis present

## 2017-08-19 DIAGNOSIS — I2510 Atherosclerotic heart disease of native coronary artery without angina pectoris: Secondary | ICD-10-CM | POA: Diagnosis present

## 2017-08-19 DIAGNOSIS — G40909 Epilepsy, unspecified, not intractable, without status epilepticus: Secondary | ICD-10-CM | POA: Diagnosis present

## 2017-08-19 DIAGNOSIS — Z8249 Family history of ischemic heart disease and other diseases of the circulatory system: Secondary | ICD-10-CM | POA: Diagnosis not present

## 2017-08-19 LAB — BASIC METABOLIC PANEL
ANION GAP: 15 (ref 5–15)
BUN: 9 mg/dL (ref 8–23)
CHLORIDE: 106 mmol/L (ref 98–111)
CO2: 21 mmol/L — AB (ref 22–32)
Calcium: 6.8 mg/dL — ABNORMAL LOW (ref 8.9–10.3)
Creatinine, Ser: 1.22 mg/dL — ABNORMAL HIGH (ref 0.44–1.00)
GFR calc non Af Amer: 43 mL/min — ABNORMAL LOW (ref >60)
GFR, EST AFRICAN AMERICAN: 50 mL/min — AB (ref >60)
Glucose, Bld: 97 mg/dL (ref 70–99)
Potassium: 3.3 mmol/L — ABNORMAL LOW (ref 3.5–5.1)
Sodium: 142 mmol/L (ref 135–145)

## 2017-08-19 LAB — GASTROINTESTINAL PANEL BY PCR, STOOL (REPLACES STOOL CULTURE)
ADENOVIRUS F40/41: NOT DETECTED
Astrovirus: NOT DETECTED
CAMPYLOBACTER SPECIES: NOT DETECTED
Cryptosporidium: NOT DETECTED
Cyclospora cayetanensis: NOT DETECTED
ENTEROPATHOGENIC E COLI (EPEC): NOT DETECTED
Entamoeba histolytica: NOT DETECTED
Enteroaggregative E coli (EAEC): NOT DETECTED
Enterotoxigenic E coli (ETEC): NOT DETECTED
Giardia lamblia: NOT DETECTED
Norovirus GI/GII: NOT DETECTED
PLESIMONAS SHIGELLOIDES: NOT DETECTED
ROTAVIRUS A: NOT DETECTED
SAPOVIRUS (I, II, IV, AND V): NOT DETECTED
SHIGA LIKE TOXIN PRODUCING E COLI (STEC): NOT DETECTED
SHIGELLA/ENTEROINVASIVE E COLI (EIEC): NOT DETECTED
Salmonella species: NOT DETECTED
Vibrio cholerae: NOT DETECTED
Vibrio species: NOT DETECTED
Yersinia enterocolitica: NOT DETECTED

## 2017-08-19 LAB — MAGNESIUM: MAGNESIUM: 0.8 mg/dL — AB (ref 1.7–2.4)

## 2017-08-19 LAB — CBC
HCT: 32.8 % — ABNORMAL LOW (ref 36.0–46.0)
HEMOGLOBIN: 10.5 g/dL — AB (ref 12.0–15.0)
MCH: 28.7 pg (ref 26.0–34.0)
MCHC: 32 g/dL (ref 30.0–36.0)
MCV: 89.6 fL (ref 78.0–100.0)
Platelets: 269 10*3/uL (ref 150–400)
RBC: 3.66 MIL/uL — AB (ref 3.87–5.11)
RDW: 12.4 % (ref 11.5–15.5)
WBC: 7.5 10*3/uL (ref 4.0–10.5)

## 2017-08-19 MED ORDER — ACETAMINOPHEN 325 MG PO TABS
650.0000 mg | ORAL_TABLET | Freq: Four times a day (QID) | ORAL | Status: DC | PRN
  Administered 2017-08-19: 650 mg via ORAL
  Filled 2017-08-19: qty 2

## 2017-08-19 MED ORDER — POTASSIUM CHLORIDE CRYS ER 20 MEQ PO TBCR
40.0000 meq | EXTENDED_RELEASE_TABLET | Freq: Once | ORAL | Status: AC
  Administered 2017-08-19: 40 meq via ORAL
  Filled 2017-08-19: qty 2

## 2017-08-19 MED ORDER — MAGNESIUM SULFATE 4 GM/100ML IV SOLN
4.0000 g | Freq: Once | INTRAVENOUS | Status: AC
  Administered 2017-08-19: 4 g via INTRAVENOUS
  Filled 2017-08-19 (×2): qty 100

## 2017-08-19 MED ORDER — PANTOPRAZOLE SODIUM 40 MG PO TBEC
40.0000 mg | DELAYED_RELEASE_TABLET | Freq: Every day | ORAL | Status: DC
  Administered 2017-08-20: 40 mg via ORAL
  Filled 2017-08-19: qty 1

## 2017-08-19 MED ORDER — ADULT MULTIVITAMIN W/MINERALS CH
1.0000 | ORAL_TABLET | Freq: Every day | ORAL | Status: DC
  Administered 2017-08-19 – 2017-08-20 (×2): 1 via ORAL
  Filled 2017-08-19 (×2): qty 1

## 2017-08-19 MED ORDER — LOPERAMIDE HCL 2 MG PO CAPS
2.0000 mg | ORAL_CAPSULE | Freq: Four times a day (QID) | ORAL | Status: DC | PRN
Start: 2017-08-19 — End: 2017-08-20
  Administered 2017-08-19: 2 mg via ORAL
  Filled 2017-08-19: qty 1

## 2017-08-19 MED ORDER — APIXABAN 5 MG PO TABS: 5.0000 mg | ORAL_TABLET | Freq: Two times a day (BID) | ORAL | Status: DC

## 2017-08-19 NOTE — Progress Notes (Signed)
Benefit check in progress for Eliquis; B Yazleemar Strassner RN,MHA,BSN [phone] 

## 2017-08-19 NOTE — Evaluation (Signed)
Physical Therapy Evaluation Patient Details Name: [name] MRN: [id number] DOB: October 21, 1945 Today's Date: 08/19/2017   History of Present Illness  72 y.o. female admitted from cardiologist office after becoming orthostatic. Pt has had 2 week bouts of diarrhea, negative for c diff. GI consulted. PMH includes but not limited to:  IBS, Seizures, Pulmonary fibrosis, HTN, HLD, Gout, Depression, CAD, CKD III,     Clinical Impression  Pt admitted with above diagnosis. Pt currently with functional limitations due to the deficits listed below (see PT Problem List). PTA pt independent driving living with husband. Pt reports 2 weeks of diarrhea and increasing weakness. Upon eval pt unsteady on her feet, weaker than baseline. Ambulating hallway with need for support. HHPT appropriate for reconditioning once she is medically ready for d/c. Did it become dizzy with me but had slight drop in BP (see vitals below). Next PT visit to focus on stairs for safe home entry.  Pt will benefit from skilled PT to increase their independence and safety with mobility to allow discharge to the venue listed below.    BP: Supine: 112/78 Seated: 101/75 Standing: 105/67 3 minute standing: 120/75     Follow Up Recommendations Home health PT;Supervision/Assistance - 24 hour    Equipment Recommendations  Other (comment)(Husband confirming tonighthey have RW- if not-they will need)    Recommendations for Other Services       Precautions / Restrictions Restrictions Weight Bearing Restrictions: No      Mobility  Bed Mobility Overal bed mobility: Needs Assistance Bed Mobility: Sit to Supine       Sit to supine: Supervision   General bed mobility comments: increased time and effort, pt noticibily weakened right now from her baseline per her report  Transfers Overall transfer level: Needs assistance Equipment used: Rolling walker (2 wheeled) Transfers: Sit to/from Stand Sit to Stand: Min guard          General transfer comment: min guard for safety  Ambulation/Gait Ambulation/Gait assistance: Min guard Gait Distance (Feet): 100 Feet Assistive device: IV Pole Gait Pattern/deviations: Step-to pattern;Step-through pattern Gait velocity: decreased   General Gait Details: pt ambulating with shortened step length, very unsteady, holding onto IV pole, requires guarding without UE support. pt reports this is not her baseline.   Stairs            Wheelchair Mobility    Modified Rankin (Stroke Patients Only)       Balance                                             Pertinent Vitals/Pain      Home Living Family/patient expects to be discharged to:: Private residence Living Arrangements: Spouse/significant other Available Help at Discharge: Family;Available 24 hours/day Type of Home: House Home Access: Stairs to enter   [employer] of Steps: 3 Home Layout: One level Home Equipment: Walker - 2 wheels      Prior Function Level of Independence: Independent               Hand Dominance        Extremity/Trunk Assessment   Upper Extremity Assessment Upper Extremity Assessment: Generalized weakness    Lower Extremity Assessment Lower Extremity Assessment: Generalized weakness       Communication      Cognition Arousal/Alertness: Lethargic Behavior During Therapy: Flat affect  General Comments      Exercises     Assessment/Plan    PT Assessment Patient needs continued PT services  PT Problem List Decreased strength;Decreased range of motion;Decreased balance;Decreased mobility;Decreased activity tolerance       PT Treatment Interventions DME instruction;Gait training;Functional mobility training;Stair training;Therapeutic activities;Therapeutic exercise;Balance training    PT Goals (Current goals can be found in the Care Plan section)  Acute Rehab PT  Goals Patient Stated Goal: feel better PT Goal Formulation: With patient Time For Goal Achievement: 08/26/17 Potential to Achieve Goals: Good    Frequency Min 3X/week   Barriers to discharge        Co-evaluation               AM-PAC PT "6 Clicks" Daily Activity  Outcome Measure Difficulty turning over in bed (including adjusting bedclothes, sheets and blankets)?: A Little Difficulty moving from lying on back to sitting on the side of the bed? : A Little Difficulty sitting down on and standing up from a chair with arms (e.g., wheelchair, bedside commode, etc,.)?: A Little Help needed moving to and from a bed to chair (including a wheelchair)?: A Little Help needed walking in hospital room?: A Little Help needed climbing 3-5 steps with a railing? : A Little 6 Click Score: 18    End of Session Equipment Utilized During Treatment: Gait belt Activity Tolerance: Patient limited by fatigue Patient left: in bed;with call bell/phone within reach;with family/visitor present   PT Visit Diagnosis: Unsteadiness on feet (R26.81);Other abnormalities of gait and mobility (R26.89);Muscle weakness (generalized) (M62.81)    Time: 9169-4503 PT Time Calculation (min) (ACUTE ONLY): 26 min   Charges:   PT Evaluation $PT Eval Low Complexity: 1 Low PT Treatments $Gait Training: 8-22 mins        Reinaldo Berber, PT, DPT Acute Rehab Services Pager: [phone]    Reinaldo Berber 08/19/2017, 4:47 PM

## 2017-08-19 NOTE — Discharge Instructions (Signed)
Information on my medicine - ELIQUIS (apixaban)  This medication education was reviewed with me or my healthcare representative as part of my discharge preparation.  The pharmacist that spoke with me during my hospital stay was:  Ladavia Lindenbaum Kay, RPH  Why was Eliquis prescribed for you? Eliquis was prescribed to treat blood clots that may have been found in the veins of your legs (deep vein thrombosis) or in your lungs (pulmonary embolism) and to reduce the risk of them occurring again.  What do You need to know about Eliquis ? The starting dose is 10 mg (two 5 mg tablets) taken TWICE daily for the FIRST SEVEN (7) DAYS, then  the dose is reduced to ONE 5 mg tablet taken TWICE daily.  Eliquis may be taken with or without food.   Try to take the dose about the same time in the morning and in the evening. If you have difficulty swallowing the tablet whole please discuss with your pharmacist how to take the medication safely.  Take Eliquis exactly as prescribed and DO NOT stop taking Eliquis without talking to the doctor who prescribed the medication.  Stopping may increase your risk of developing a new blood clot.  Refill your prescription before you run out.  After discharge, you should have regular check-up appointments with your healthcare provider that is prescribing your Eliquis.    What do you do if you miss a dose? If a dose of ELIQUIS is not taken at the scheduled time, take it as soon as possible on the same day and twice-daily administration should be resumed. The dose should not be doubled to make up for a missed dose.  Important Safety Information A possible side effect of Eliquis is bleeding. You should call your healthcare provider right away if you experience any of the following: Bleeding from an injury or your nose that does not stop. Unusual colored urine (red or dark brown) or unusual colored stools (red or black). Unusual bruising for unknown reasons. A serious  fall or if you hit your head (even if there is no bleeding).  Some medicines may interact with Eliquis and might increase your risk of bleeding or clotting while on Eliquis. To help avoid this, consult your healthcare provider or pharmacist prior to using any new prescription or non-prescription medications, including herbals, vitamins, non-steroidal anti-inflammatory drugs (NSAIDs) and supplements.  This website has more information on Eliquis (apixaban): http://www.eliquis.com/eliquis/home  

## 2017-08-19 NOTE — Progress Notes (Signed)
Initial Nutrition Assessment  DOCUMENTATION CODES:   Not applicable  INTERVENTION:   -MVI with minerals daily  NUTRITION DIAGNOSIS:   Inadequate oral intake related to altered GI function, decreased appetite as evidenced by meal completion < 25%, per patient/family report.  GOAL:   Patient will meet greater than or equal to 90% of their needs  MONITOR:   PO intake, Supplement acceptance, Diet advancement, Labs, Weight trends, Skin, I & O's  REASON FOR ASSESSMENT:   Malnutrition Screening Tool    ASSESSMENT:   [name] is a 72 y.o. female with medical history significant of coronary artery disease, pulmonary fibrosis, CKD stage II-III, hypothyroidism, hypertension, hyperlipidemia who presented to the emergency department after she was sent from her cardiologist's office for the evaluation of orthostatic hypotension.  Pt admitted with orthostatic hypotension.   Spoke with pt at bedside, who reports poor appetite over the past 2.5-3 weeks. Pt's largest complaint is "it all runs right through me". She describes herself as a light eater in general. She usually consumes one large meal per day (Breakfast: sausage, egg, and cheese biscuit) and snacks on cookies and goldfish crackers. Pt mainly consumes water, tea, and crystal light.   Pt reports she has lost about 10-15# over the past 2 weeks, however, this is not consistent with wt hx, which reveals wt stability over the past 6 months.   Discussed with pt importance of good meal and supplement intake to promote healing. Pt did not eat much of clear liquid diet due to food preferences, however, diet will be advanced to heart healthy for lunch. Pt looking forward to solid foods, but apprehensive regarding tolerate. Pt refused offer of nutritional supplements, due to fear that she will not tolerate them well.   Labs reviewed: K: 3.3 (on PO supplementation).   NUTRITION - FOCUSED PHYSICAL EXAM:    Diet Order:   Diet Order            Diet Heart Room service appropriate? Yes; Fluid consistency: Thin  Diet effective now              EDUCATION NEEDS:   Education needs have been addressed  Skin:  Skin Assessment: Reviewed RN Assessment  Last BM:  08/18/17  Height:   Ht Readings from Last 1 Encounters:  08/18/17 4\' 11"  (1.499 m)    Weight:   Wt Readings from Last 1 Encounters:  08/19/17 66.8 kg    Ideal Body Weight:  44.5 kg  BMI:  Body mass index is 29.74 kg/m.  Estimated Nutritional Needs:   Kcal:  1600-1800  Protein:  80-95 grams  Fluid:  1.6-1.8 L    Jayquon Theiler A. Jimmye Norman, RD, LDN, CDE Pager: [phone] After hours Pager: [phone]

## 2017-08-19 NOTE — Consult Note (Addendum)
Referring Provider: [facility]    Primary Care Physician:  Carol Ada, MD Primary Gastroenterologist:  Scarlette Shorts, MD  Reason for Consultation:  diarrhea    ASSESSMENT AND PLAN:    72. 72 yo female with multiple medical problems admitted withpostprandial diarrhea for two weeks. C-diff and stool pathogen panel negative. She describes loose black stools. No bismuth. Does take iron but doesn't usually turn stools dark. Hgb 13 on admission, likely so much higher than her baseline ~ 9 because she was volume depleted. Hgb now ~ 10.5.  -no diarrhea yesterday but ate only a roll.  -Colonoscopy 2011 for heme positive stools. Exam normal. Ten year follow up recommended but may proceed earlier if diarrhea doesn't resolve.   -She can take Imodium as needed.I will have office nurse contact her on Monday to get a condition update and arrange for follow up   Attending physician's note   I have taken an interval history, reviewed the chart and examined the patient. I agree with the Advanced Practitioner's note, impression and recommendations.   72 year old with MMP admitted with postprandial diarrhea, neg C. Diff and GI pathogen panel.  Hemoglobin at baseline.  Negative colonoscopy in 2011 (26yr FU recommended). Plan: Agree with Imodium, no GI intervention planned.  Follow-up as an outpatient.  Carmell Austria, MD   HPI: [name] is a 72 y.o. female with multiple medical problems. She is followed by [place] for GERD / chronic dysphagia. Patient admitted two days ago with orthostatic hypotension, dizziness. She has been having postprandial, non-bloody diarrhea for ~ two weeks. She describes stool as loose and black. No bismuth. She does take iron but it doesn't typically turn stools dark. No associated N/V or abdominal pain. No recent antibiotics and C-diff is negative. No recent medication changes. She was seen in Cardiology office, found to be orthostatic and sent to ED. Patient doesn't have  chronic diarrhea. Sometimes she has diarrhea with certain foods but for the most part her BM are normal.     Past Medical History:  Diagnosis Date  . Anxiety   . Arthritis   . Carpal tunnel syndrome, bilateral 07/31/2015  . Cataract   . Cervical spondylosis without myelopathy   . Chronic chest pain   . Chronic kidney disease   . Chronic low back pain   . Chronotropic incompetence   . CKD (chronic kidney disease), stage III ([place])   . Coronary artery disease    a. inf-post MI 2011 s/p DES to RCA. b. DES to RCA 06/2010. c. DES to LAD 2014, residual dz treated medically.  . Depression   . Esophageal stricture   . GERD (gastroesophageal reflux disease)   . Gross hematuria   . History of esophageal dilatation    FOR STRIUCTURE  . History of gout   . History of hiatal hernia   . History of kidney stones   . Hyperlipidemia   . Hypertension   . Hypothyroidism   . Internal hemorrhoids   . Irritable bowel syndrome   . Mild aortic stenosis    by echo 2018  . Obesity   . Orthostatic hypotension   . Partial seizure disorder (HCC) NEUROLOGIST-- DR WILLIS   NOCTURNAL  . PONV (postoperative nausea and vomiting)    hard time getting iv site-had to do neck stick 2 yrs ago  . Pulmonary fibrosis ([place])   . S/P pericardial cyst excision    02-05-2013  benign  . Seizures ([place])    none in  yrs  . SVT (supraventricular tachycardia) ([place])   . Trigger finger, acquired 09/30/2015   Right middle finger  . Vitamin D deficiency     Past Surgical History:  Procedure Laterality Date  . BIOPSY OF MEDIASTINAL MASS N/A 02/05/2013   Procedure: RESECTION OF MEDIASTINAL MASS;  Surgeon: Ivin Poot, MD;  Location: [place];  Service: Thoracic;  Laterality: N/A;  . CARDIAC CATHETERIZATION Right 01/24/2015   Procedure: right femoral ARTERIAL LINE INSERTION;  Surgeon: Ivin Poot, MD;  Location: [place];  Service: Thoracic;  Laterality: Right;  . CARDIOVASCULAR STRESS TEST  11-22-2013  dr Irish Lack   normal  lexiscan study/  no ischemia/  normal LVF and wall motion/ ef 81%  . COLONOSCOPY  last one 2014  . CORONARY ANGIOGRAM  03/10/2011   Procedure: CORONARY ANGIOGRAM;  Surgeon: Jettie Booze, MD;  Location: [facility] CATH LAB;  Service: Cardiovascular;;  . CORONARY ANGIOPLASTY WITH STENT PLACEMENT  11-22-2009  dr Irish Lack   Acute inferoposterior MI/  ef 60%,  PCI with DES x1 to dRCA,  25%  mLAD  . CORONARY ANGIOPLASTY WITH STENT PLACEMENT  06-26-2010  dr Daneen Schick   Cutting balloon angioplasty to dRCA with DES x1,  40% mLAD (non-obstructive CAD)  . CYSTOSCOPY WITH RETROGRADE PYELOGRAM, URETEROSCOPY AND STENT PLACEMENT Right 02/13/2014   Procedure: CYSTOSCOPY WITH RETROGRADE PYELOGRAM, URETEROSCOPY AND STENT PLACEMENT;  Surgeon: Bernestine Amass, MD;  Location: [facility];  Service: Urology;  Laterality: Right;  . ECTOPIC PREGNANCY SURGERY  YRS AGO   SALPINGECTOMY  . ESOPHAGOGASTRODUODENOSCOPY N/A 02/15/2014   Procedure: ESOPHAGOGASTRODUODENOSCOPY (EGD);  Surgeon: Jerene Bears, MD;  Location: [facility];  Service: Endoscopy;  Laterality: N/A;  . ESOPHAGOGASTRODUODENOSCOPY (EGD) WITH ESOPHAGEAL DILATION  05-20-2011  . HOLMIUM LASER APPLICATION Right 09/17/7891   Procedure: HOLMIUM LASER APPLICATION;  Surgeon: Bernestine Amass, MD;  Location: [facility];  Service: Urology;  Laterality: Right;  . LEFT HEART CATHETERIZATION WITH CORONARY ANGIOGRAM N/A 03/14/2012   Procedure: LEFT HEART CATHETERIZATION WITH CORONARY ANGIOGRAM;  Surgeon: Sueanne Margarita, MD;  Location: [place] CATH LAB;  Service: Cardiovascular;  Laterality: N/A;  Normal LM,  50% pLAD,  70% mLAD,  D2 50-70%, very tortuous LAD,  70-82% ostial LCFX,  50% in-stent restenosis of  dRCA and mRCA  stent and 50-70% ostial PDA,  normal LVSF, ef 60%  . LUNG BIOPSY Right 01/24/2015   Procedure: RIGHT LUNG BIOPSY;  Surgeon: Ivin Poot, MD;  Location: [place];  Service: Thoracic;  Laterality: Right;  . MEDIASTERNOTOMY N/A 02/05/2013    Procedure: MEDIAN STERNOTOMY;  Surgeon: Ivin Poot, MD;  Location: [place];  Service: Thoracic;  Laterality: N/A;  . MEDIASTERNOTOMY Left 02/05/2013  . NEEDLE GUIDED EXCISION BREAST CALCIFICATIONS Right 08-09-2008  . PERCUTANEOUS CORONARY STENT INTERVENTION (PCI-S) N/A 04/03/2012   Procedure: PERCUTANEOUS CORONARY STENT INTERVENTION (PCI-S);  Surgeon: Jettie Booze, MD;  Location: [facility] CATH LAB;  Service: Cardiovascular;  Laterality: N/A;   Successful PCI  mLAD with 2.75x12 Promus stent, postdilated to >0.83mm  . THORACOTOMY Right 01/24/2015   Procedure: RIGHT MINI/LIMITED THORACOTOMY;  Surgeon: Ivin Poot, MD;  Location: [place];  Service: Thoracic;  Laterality: Right;  . TRANSTHORACIC ECHOCARDIOGRAM  11-17-2011   grade I diastolic dysfunction/  ef 55-60%  . VIDEO BRONCHOSCOPY N/A 01/24/2015   Procedure: RIGHT VIDEO BRONCHOSCOPY;  Surgeon: Ivin Poot, MD;  Location: [facility] OR;  Service: Thoracic;  Laterality: N/A;    Prior to Admission medications  Medication Sig Start Date End Date Taking? Authorizing Provider  ALPRAZolam (XANAX) 0.25 MG tablet Take 0.25 mg by mouth 3 (three) times daily.    Yes [provider]  aspirin EC 81 MG tablet Take 81 mg by mouth daily.   Yes [provider]  BYSTOLIC 5 MG tablet TAKE 1 TABLET (5 MG TOTAL) BY MOUTH DAILY. 04/18/17  Yes Turner, Eber Hong, MD  calcitRIOL (ROCALTROL) 0.25 MCG capsule Take 0.25 mcg by mouth daily.    Yes [provider]  clopidogrel (PLAVIX) 75 MG tablet TAKE 1 TABLET (75 MG TOTAL) BY MOUTH DAILY WITH BREAKFAST. 01/06/17  Yes Turner, Eber Hong, MD  diltiazem (CARDIZEM CD) 240 MG 24 hr capsule Take 1 capsule (240 mg total) by mouth daily. 04/05/17  Yes Allred, Jeneen Rinks, MD  DULoxetine (CYMBALTA) 30 MG capsule Take 30 mg by mouth daily.    Yes [provider]  ferrous sulfate 325 (65 FE) MG tablet Take 325 mg by mouth daily with breakfast.    Yes [provider]  furosemide (LASIX) 40 MG tablet  TAKE 1 TABLET BY MOUTH EVERY OTHER DAY 02/21/17  Yes Turner, Eber Hong, MD  HYDROcodone-acetaminophen (NORCO) 10-325 MG tablet Take 1 tablet by mouth every 6 (six) hours as needed.   Yes [provider]  isosorbide mononitrate (IMDUR) 60 MG 24 hr tablet Take 90 mg by mouth daily.   Yes [provider]  levETIRAcetam (KEPPRA) 500 MG tablet TAKE 1 TABLET BY MOUTH TWICE A DAY 07/04/17  Yes Millikan, Megan, NP  levothyroxine (SYNTHROID, LEVOTHROID) 112 MCG tablet Take 112 mcg by mouth daily. 06/25/15  Yes [provider]  losartan (COZAAR) 50 MG tablet Take 50 mg daily by mouth.   Yes [provider]  Magnesium Oxide 400 (240 Mg) MG TABS Take 400 mg by mouth daily.    Yes [provider]  nitroGLYCERIN (NITROSTAT) 0.4 MG SL tablet PLACE 1 TABLET (0.4 MG TOTAL) UNDER THE TONGUE EVERY 5 (FIVE) MINUTES AS NEEDED FOR CHEST PAIN. 02/14/17  Yes Turner, Traci R, MD  pantoprazole (PROTONIX) 40 MG tablet TAKE 1 TABLET (40 MG TOTAL) BY MOUTH 2 (TWO) TIMES DAILY. 04/04/17  Yes Irene Shipper, MD  potassium chloride SA (K-DUR,KLOR-CON) 20 MEQ tablet Take 20 mEq by mouth 2 (two) times daily.    Yes [provider]  rosuvastatin (CRESTOR) 20 MG tablet TAKE 1 TABLET (20 MG TOTAL) BY MOUTH EVERY MORNING. Patient taking differently: Take 20 mg by mouth every evening.  01/31/17  Yes Turner, Eber Hong, MD    Current Facility-Administered Medications  Medication Dose Route Frequency Provider Last Rate Last Dose  . 0.9 %  sodium chloride infusion   Intravenous Continuous Shelly Coss, MD 125 mL/hr at 08/19/17 0600    . ALPRAZolam Duanne Moron) tablet 0.25 mg  0.25 mg Oral TID Shelly Coss, MD   0.25 mg at 08/19/17 1518  . apixaban (ELIQUIS) tablet 10 mg  10 mg Oral BID Shelly Coss, MD   10 mg at 08/19/17 1000  . [START ON 08/25/2017] apixaban (ELIQUIS) tablet 5 mg  5 mg Oral BID Shelly Coss, MD      . aspirin EC tablet 81 mg  81 mg Oral Daily Adhikari, Amrit, MD   81 mg at  08/19/17 1001  . calcitRIOL (ROCALTROL) capsule 0.25 mcg  0.25 mcg Oral Daily Shelly Coss, MD   0.25 mcg at 08/19/17 1000  . clopidogrel (PLAVIX) tablet 75 mg  75 mg Oral Daily Adhikari,  Amrit, MD   75 mg at 08/19/17 1000  . DULoxetine (CYMBALTA) DR capsule 30 mg  30 mg Oral Daily Shelly Coss, MD   30 mg at 08/19/17 1000  . ferrous sulfate tablet 325 mg  325 mg Oral Q breakfast Shelly Coss, MD   325 mg at 08/19/17 0836  . isosorbide mononitrate (IMDUR) 24 hr tablet 90 mg  90 mg Oral Daily Shelly Coss, MD   90 mg at 08/19/17 1000  . levETIRAcetam (KEPPRA) tablet 500 mg  500 mg Oral BID Shelly Coss, MD   500 mg at 08/19/17 1001  . levothyroxine (SYNTHROID, LEVOTHROID) tablet 112 mcg  112 mcg Oral QAC breakfast Shelly Coss, MD   112 mcg at 08/19/17 0534  . loperamide (IMODIUM) capsule 2 mg  2 mg Oral Q6H PRN Shelly Coss, MD   2 mg at 08/19/17 1405  . magnesium oxide (MAG-OX) tablet 400 mg  400 mg Oral Daily Shelly Coss, MD   400 mg at 08/19/17 1001  . magnesium sulfate IVPB 4 g 100 mL  4 g Intravenous Once Shelly Coss, MD 50 mL/hr at 08/19/17 1459 4 g at 08/19/17 1459  . multivitamin with minerals tablet 1 tablet  1 tablet Oral Daily Shelly Coss, MD   1 tablet at 08/19/17 1518  . ondansetron (ZOFRAN) injection 4 mg  4 mg Intravenous Q6H PRN Schorr, Rhetta Mura, NP   4 mg at 08/18/17 2023  . [START ON 08/20/2017] pantoprazole (PROTONIX) EC tablet 40 mg  40 mg Oral Daily Adhikari, Amrit, MD      . potassium chloride SA (K-DUR,KLOR-CON) CR tablet 20 mEq  20 mEq Oral BID Shelly Coss, MD   20 mEq at 08/19/17 1001  . rosuvastatin (CRESTOR) tablet 20 mg  20 mg Oral q morning - 10a Shelly Coss, MD   20 mg at 08/19/17 1000    Allergies as of 08/18/2017  . (No Known Allergies)    Family History  Problem Relation Age of Onset  . Breast cancer Sister   . Heart disease Father   . Hypertension Father   . Emphysema Father   . Coronary artery disease Father     . Diabetes Mellitus I Father   . Heart attack Mother   . CVA Mother   . Breast cancer Unknown        niece  . Colon cancer Neg Hx   . Stomach cancer Neg Hx   . Esophageal cancer Neg Hx   . Rectal cancer Neg Hx     Social History   Socioeconomic History  . Marital status: Married    Spouse name: Fritz Pickerel  . Number of children: 0  . Years of education: hs  . Highest education level: Not on file  Occupational History  . Occupation: Retired    Fish farm manager: RETIRED  Social Needs  . Financial resource strain: Not on file  . Food insecurity:    Worry: Not on file    Inability: Not on file  . Transportation needs:    Medical: Not on file    Non-medical: Not on file  Tobacco Use  . Smoking status: Former Smoker    Packs/day: 0.30    Years: 3.00    Pack years: 0.90    Types: Cigarettes    Start date: 11/08/1964    Last attempt to quit: 11/09/1967    Years since quitting: 49.8  . Smokeless tobacco: Never Used  Substance and Sexual Activity  . Alcohol use: No  Alcohol/week: 0.0 standard drinks  . Drug use: No  . Sexual activity: Not on file  Lifestyle  . Physical activity:    Days per week: Not on file    Minutes per session: Not on file  . Stress: Not on file  Relationships  . Social connections:    Talks on phone: Not on file    Gets together: Not on file    Attends religious service: Not on file    Active member of club or organization: Not on file    Attends meetings of clubs or organizations: Not on file    Relationship status: Not on file  . Intimate partner violence:    Fear of current or ex partner: Not on file    Emotionally abused: Not on file    Physically abused: Not on file    Forced sexual activity: Not on file  Other Topics Concern  . Not on file  Social History Narrative   Lives at home, married   Left-handed   Daily caffeine use: coffee.    Review of Systems: All systems reviewed and negative except where noted in HPI.  Physical  Exam: Vital signs in last 24 hours: Temp:  [98 F (36.7 C)-98.9 F (37.2 C)] 98.9 F (37.2 C) (08/09 0759) Pulse Rate:  [76-87] 80 (08/09 0759) Resp:  [16-20] 19 (08/09 0759) BP: (105-126)/(61-75) 126/66 (08/09 0759) SpO2:  [93 %-98 %] 98 % (08/09 0759) Weight:  [64.5 kg-66.8 kg] 66.8 kg (08/09 0520) Last BM Date: 08/18/17 General:   Alert, female in NAD Psych:  Pleasant, cooperative. Normal mood and affect. Eyes:  Pupils equal, sclera clear, no icterus.   Conjunctiva pink. Ears:  Normal auditory acuity. Nose:  No deformity, discharge,  or lesions. Neck:  Supple; no masses Lungs:  Clear throughout to auscultation.   No wheezes, crackles, or rhonchi.  Heart:  Regular rate and rhythm; +murmur, no edema Abdomen:  Soft, non-distended, nontender, BS active, no palp mass    Rectal:  Deferred  Msk:  Symmetrical without gross deformities. . Neurologic:  Alert and  oriented x4;  grossly normal neurologically. Skin:  Intact without significant lesions or rashes..   Intake/Output from previous day: 08/08 0701 - 08/09 0700 In: 2247.8 [P.O.:360; I.V.:887.8; IV Piggyback:1000] Out: 100 [Urine:100] Intake/Output this shift: Total I/O In: 860.8 [P.O.:399; I.V.:461.8] Out: -   Lab Results: Recent Labs    08/18/17 1154 08/19/17 0550  WBC 8.3 7.5  HGB 12.3 10.5*  HCT 38.0 32.8*  PLT 327 269   BMET Recent Labs    08/18/17 1154 08/19/17 0550  NA 141 142  K 3.7 3.3*  CL 106 106  CO2 23 21*  GLUCOSE 162* 97  BUN 15 9  CREATININE 1.42* 1.22*  CALCIUM 7.0* 6.8*   LFT Recent Labs    08/18/17 1300  PROT 7.2  ALBUMIN 3.2*  AST 14*  ALT 9  ALKPHOS 76  BILITOT 0.8  BILIDIR 0.2  IBILI 0.6    Studies/Results: Ct Angio Chest Pe W And/or Wo Contrast  Result Date: 08/18/2017 CLINICAL DATA:  Weakness and fatigue, reports shortness of breath. States symptoms x2 weeks. Reports dark stools, however also reports takes daily iron. Pt is a/o x4. Pale on arrival, conjunctivae pale.  EXAM: CT ANGIOGRAPHY CHEST WITH CONTRAST TECHNIQUE: Multidetector CT imaging of the chest was performed using the standard protocol during bolus administration of intravenous contrast. Multiplanar CT image reconstructions and MIPs were obtained to evaluate the vascular anatomy. CONTRAST:  119mL  ISOVUE-370 IOPAMIDOL (ISOVUE-370) INJECTION 76% COMPARISON:  Chest x-ray on 08/18/2017, chest CT on 08/20/2015 FINDINGS: Cardiovascular: Pulmonary arteries are well opacified. There is no acute pulmonary embolus. There is mitral annulus calcification. Dense atherosclerotic calcification of the coronary arteries. Trace pericardial effusion. The heart is mildly enlarged. There are focal calcifications of the thoracic aorta not associated with aneurysm. Mediastinum/Nodes: Median sternotomy. The visualized portion of the thyroid gland has a normal appearance. Esophagus is normal in appearance. No mediastinal, hilar, or axillary adenopathy. Lungs/Pleura: There are dependent patchy areas of ground-glass density and smooth septal thickening. No focal consolidations or pleural effusions. No suspicious pulmonary nodules. There are focal areas of pleural calcification. Upper Abdomen: Stable elevation of the RIGHT hemidiaphragm. Gallbladder is present. No acute abnormality. Musculoskeletal: Median sternotomy. Remote rib fractures. Midthoracic degenerative changes. Review of the MIP images confirms the above findings. IMPRESSION: 1. Normal opacification of pulmonary arteries. No acute pulmonary embolus. 2. Coronary artery and mitral calcification. 3. Mild cardiomegaly and mild pulmonary edema. 4.  Aortic Atherosclerosis (ICD10-I70.0). Electronically Signed   By: Nolon Nations M.D.   On: 08/18/2017 17:47   Dg Chest Portable 1 View  Result Date: 08/18/2017 CLINICAL DATA:  Lightheadedness today, syncope last week. EXAM: PORTABLE CHEST 1 VIEW COMPARISON:  Chest x-ray dated 02/19/2016. FINDINGS: Heart size and mediastinal contours are  stable. Median sternotomy wires appear intact and stable in alignment. Stable chronic elevation of the RIGHT hemidiaphragm. Lungs are clear. No pleural effusion or pneumothorax seen. No acute or suspicious osseous finding. IMPRESSION: No active disease.  No evidence of pneumonia or pulmonary edema. Electronically Signed   By: Franki Cabot M.D.   On: 08/18/2017 13:08     Tye Savoy, NP-C @  08/19/2017, 3:44 PM

## 2017-08-19 NOTE — Telephone Encounter (Signed)
New Message    Patients husband is calling to advise that his wife has been admitted to the hospital. He stays that he BP was exteremly low and they found a blood clot in her leg. She just wanted to inform the provider.

## 2017-08-19 NOTE — Care Management (Signed)
Per rep at [facility]/ [facility]:  Phone# [phone]  Eliquis: 5mg  4 times daily (10mg  BID not available on formulary) / Covered/ No auth requried  30 day retail: $100.44 Co-pay is estimated at [facility]  90 day mail order: $80.00

## 2017-08-19 NOTE — Telephone Encounter (Signed)
Routing to Dr. Radford Pax for her awareness

## 2017-08-19 NOTE — Progress Notes (Addendum)
PROGRESS NOTE    [name]  IOX:[id number] DOB: [birth year] DOA: 08/18/2017 PCP: Carol Ada, MD   Brief Narrative: [name] is a 72 y.o. female with medical history significant of coronary artery disease, pulmonary fibrosis, CKD stage II-III, hypothyroidism, hypertension, hyperlipidemia who presented to the emergency department after she was sent from her cardiologist's office for the evaluation of orthostatic hypotension.  Apparently her systolic blood pressure dropped from 120 mmHg to 80 mmHg while she was moved from supine to sitting position.  Patient was complaining of dizziness and lightheadedness since last 2 weeks.  Patient reports of having diarrhea for the same time.Patient was found to be orthostatic on presentation here in the ED.  D-dimer was mildly elevated.  Venous duplex came out to be positive for  acute deep vein thrombosis involving left Peroneal veins mid to distal segment. Started on Eliquis.  Assessment & Plan:   Principal Problem:   Orthostatic hypotension Active Problems:   Hypertension   Hyperlipidemia   History of drug coated stent   Hypothyroidism   History of  seizures   Depression   Pulmonary fibrosis (HCC)   Diarrhea   DVT (deep venous thrombosis) (HCC)  Orthostatic hypotension: Patient was complaining of dizziness and lightheadedness since last 2 weeks.  Patient was found to be profoundly orthostatic. Started on IV fluids.  We will continue to monitor orthostatic vitals.  We will hold Cardizem, Bystolic and Lasix which she was taking at home. This morning she was still orthostatic.  Her systolic blood pressure dropped from 120 mmHg to 90 mmHg when she was moved from supine to a standing position.  Continue IV fluids for today.  Recheck orthostatic vitals tomorrow morning.  Diarrhea: Has been going on for a while. She also has history of irritable bowel syndrome. Negative C. difficile and GI pathogen panel.  She needs GI evaluation as an  inpatient/outpatient.  Will request for GI evaluation here . Apparently patient became volume depleted and orthostatic due to chronic diarrhea.  Started on Imodium.  Lower extremity QAS:TMHDQQ duplex came out to be positive for  acute deep vein thrombosis involving left Peroneal veins mid to distal segment. Started on Eliquis.  Case manager consulted for coupons  Mild AKI on CKD stage II-III: Kidney function near baseline.  Improving. Continue IV fluids.  Follows with nephrology Dr. Olivia Mackie at [facility].  Severe hypomagnesemia/hypokalemia: Most likely secondary to diarrhea.  We will continue supplementation.  Will check levels tomorrow.  History of coronary disease: Status post inferior posterior MI in 2011.  She is status post stents x3.  Continue aspirin and Plavix.  History of PSVT: On Cardizem and Bystolic at home.  We will hold this medications for now due to orthostatic hypotension.  Hypothyroidism: Continue Synthyroid  Depression/anxiety: Continue her home meds  Elevated d-dimer: Venous  Duplex showed DVT.  History of pulmonary fibrosis: Not on oxygen at home.  Currently stable  Seizure disorder: On Keppra at home which we will continue.  Deconditioning/debility/generalized weakness: We will request for PT evaluation.   DVT prophylaxis: Eliquis Code Status: Full Family Communication: None present at the bedside today Disposition Plan: Depends upon PT evaluation.  She is stable for discharge as soon as her orthostatic vitals are negative.   Consultants: None  Procedures:None  Antimicrobials: None  Subjective: Patient seen and examined at the bedside this morning.  Feels much better today.  Generalized weakness has improved.  No dizziness or lightheadedness.  But she was still orthostatic on  examination today.  Objective: Vitals:   08/19/17 0059 08/19/17 0520 08/19/17 0758 08/19/17 0759  BP: 105/71 123/63  126/66  Pulse: 77 81  80  Resp: 16 18  19     Temp: 98.3 F (36.8 C) 98.2 F (36.8 C) 98.9 F (37.2 C) 98.9 F (37.2 C)  TempSrc: Oral Oral Oral   SpO2: 94% 93%  98%  Weight:  66.8 kg    Height:        Intake/Output Summary (Last 24 hours) at 08/19/2017 1328 Last data filed at 08/19/2017 1015 Gross per 24 hour  Intake 2931.63 ml  Output 100 ml  Net 2831.63 ml   Filed Weights   08/18/17 1810 08/19/17 0520  Weight: 64.5 kg 66.8 kg    Examination:  General exam: Appears calm and comfortable ,Not in distress,average built HEENT:PERRL,Oral mucosa moist, Ear/Nose normal on gross exam Respiratory system: Bilateral equal air entry, normal vesicular breath sounds, no wheezes or crackles  Cardiovascular system: S1 & S2 heard, RRR. No JVD, murmurs, rubs, gallops or clicks. No pedal edema. Gastrointestinal system: Abdomen is nondistended, soft and nontender. No organomegaly or masses felt. Normal bowel sounds heard. Central nervous system: Alert and oriented. No focal neurological deficits. Extremities: No edema, no clubbing ,no cyanosis, distal peripheral pulses palpable. Skin: No rashes, lesions or ulcers,no icterus ,no pallor MSK: Normal muscle bulk,tone ,power Psychiatry: Judgement and insight appear normal. Mood & affect appropriate.     Data Reviewed: I have personally reviewed following labs and imaging studies  CBC: Recent Labs  Lab 08/18/17 1154 08/19/17 0550  WBC 8.3 7.5  HGB 12.3 10.5*  HCT 38.0 32.8*  MCV 90.0 89.6  PLT 327 956   Basic Metabolic Panel: Recent Labs  Lab 08/18/17 1154 08/18/17 1300 08/19/17 0550  NA 141  --  142  K 3.7  --  3.3*  CL 106  --  106  CO2 23  --  21*  GLUCOSE 162*  --  97  BUN 15  --  9  CREATININE 1.42*  --  1.22*  CALCIUM 7.0*  --  6.8*  MG  --  1.0* 0.8*   GFR: Estimated Creatinine Clearance: 35.1 mL/min (A) (by C-G formula based on SCr of 1.22 mg/dL (H)). Liver Function Tests: Recent Labs  Lab 08/18/17 1300  AST 14*  ALT 9  ALKPHOS 76  BILITOT 0.8  PROT 7.2   ALBUMIN 3.2*   Recent Labs  Lab 08/18/17 1300  LIPASE 30   No results for input(s): AMMONIA in the last 168 hours. Coagulation Profile: No results for input(s): INR, PROTIME in the last 168 hours. Cardiac Enzymes: No results for input(s): CKTOTAL, CKMB, CKMBINDEX, TROPONINI in the last 168 hours. BNP (last 3 results) No results for input(s): PROBNP in the last 8760 hours. HbA1C: No results for input(s): HGBA1C in the last 72 hours. CBG: No results for input(s): GLUCAP in the last 168 hours. Lipid Profile: No results for input(s): CHOL, HDL, LDLCALC, TRIG, CHOLHDL, LDLDIRECT in the last 72 hours. Thyroid Function Tests: Recent Labs    08/18/17 1300  TSH 1.063   Anemia Panel: No results for input(s): VITAMINB12, FOLATE, FERRITIN, TIBC, IRON, RETICCTPCT in the last 72 hours. Sepsis Labs: No results for input(s): PROCALCITON, LATICACIDVEN in the last 168 hours.  Recent Results (from the past 240 hour(s))  Gastrointestinal Panel by PCR , Stool     Status: None   Collection Time: 08/18/17  9:00 PM  Result Value Ref Range Status  Campylobacter species NOT DETECTED NOT DETECTED Final   Plesimonas shigelloides NOT DETECTED NOT DETECTED Final   Salmonella species NOT DETECTED NOT DETECTED Final   Yersinia enterocolitica NOT DETECTED NOT DETECTED Final   Vibrio species NOT DETECTED NOT DETECTED Final   Vibrio cholerae NOT DETECTED NOT DETECTED Final   Enteroaggregative E coli (EAEC) NOT DETECTED NOT DETECTED Final   Enteropathogenic E coli (EPEC) NOT DETECTED NOT DETECTED Final   Enterotoxigenic E coli (ETEC) NOT DETECTED NOT DETECTED Final   Shiga like toxin producing E coli (STEC) NOT DETECTED NOT DETECTED Final   Shigella/Enteroinvasive E coli (EIEC) NOT DETECTED NOT DETECTED Final   Cryptosporidium NOT DETECTED NOT DETECTED Final   Cyclospora cayetanensis NOT DETECTED NOT DETECTED Final   Entamoeba histolytica NOT DETECTED NOT DETECTED Final   Giardia lamblia NOT DETECTED  NOT DETECTED Final   Adenovirus F40/41 NOT DETECTED NOT DETECTED Final   Astrovirus NOT DETECTED NOT DETECTED Final   Norovirus GI/GII NOT DETECTED NOT DETECTED Final   Rotavirus A NOT DETECTED NOT DETECTED Final   Sapovirus (I, II, IV, and V) NOT DETECTED NOT DETECTED Final    Comment: Performed at [facility], [address] [place], [place] [address]  C difficile quick scan w PCR reflex     Status: None   Collection Time: 08/18/17  9:00 PM  Result Value Ref Range Status   C Diff antigen NEGATIVE NEGATIVE Final   C Diff toxin NEGATIVE NEGATIVE Final   C Diff interpretation No C. difficile detected.  Final         Radiology Studies: Ct Angio Chest Pe W And/or Wo Contrast  Result Date: 08/18/2017 CLINICAL DATA:  Weakness and fatigue, reports shortness of breath. States symptoms x2 weeks. Reports dark stools, however also reports takes daily iron. Pt is a/o x4. Pale on arrival, conjunctivae pale. EXAM: CT ANGIOGRAPHY CHEST WITH CONTRAST TECHNIQUE: Multidetector CT imaging of the chest was performed using the standard protocol during bolus administration of intravenous contrast. Multiplanar CT image reconstructions and MIPs were obtained to evaluate the vascular anatomy. CONTRAST:  179mL ISOVUE-370 IOPAMIDOL (ISOVUE-370) INJECTION 76% COMPARISON:  Chest x-ray on 08/18/2017, chest CT on 08/20/2015 FINDINGS: Cardiovascular: Pulmonary arteries are well opacified. There is no acute pulmonary embolus. There is mitral annulus calcification. Dense atherosclerotic calcification of the coronary arteries. Trace pericardial effusion. The heart is mildly enlarged. There are focal calcifications of the thoracic aorta not associated with aneurysm. Mediastinum/Nodes: Median sternotomy. The visualized portion of the thyroid gland has a normal appearance. Esophagus is normal in appearance. No mediastinal, hilar, or axillary adenopathy. Lungs/Pleura: There are dependent patchy areas of ground-glass  density and smooth septal thickening. No focal consolidations or pleural effusions. No suspicious pulmonary nodules. There are focal areas of pleural calcification. Upper Abdomen: Stable elevation of the RIGHT hemidiaphragm. Gallbladder is present. No acute abnormality. Musculoskeletal: Median sternotomy. Remote rib fractures. Midthoracic degenerative changes. Review of the MIP images confirms the above findings. IMPRESSION: 1. Normal opacification of pulmonary arteries. No acute pulmonary embolus. 2. Coronary artery and mitral calcification. 3. Mild cardiomegaly and mild pulmonary edema. 4.  Aortic Atherosclerosis (ICD10-I70.0). Electronically Signed   By: Nolon Nations M.D.   On: 08/18/2017 17:47   Dg Chest Portable 1 View  Result Date: 08/18/2017 CLINICAL DATA:  Lightheadedness today, syncope last week. EXAM: PORTABLE CHEST 1 VIEW COMPARISON:  Chest x-ray dated 02/19/2016. FINDINGS: Heart size and mediastinal contours are stable. Median sternotomy wires appear intact and stable in alignment. Stable chronic  elevation of the RIGHT hemidiaphragm. Lungs are clear. No pleural effusion or pneumothorax seen. No acute or suspicious osseous finding. IMPRESSION: No active disease.  No evidence of pneumonia or pulmonary edema. Electronically Signed   By: Franki Cabot M.D.   On: 08/18/2017 13:08        Scheduled Meds: . ALPRAZolam  0.25 mg Oral TID  . apixaban  10 mg Oral BID  . aspirin EC  81 mg Oral Daily  . calcitRIOL  0.25 mcg Oral Daily  . clopidogrel  75 mg Oral Daily  . DULoxetine  30 mg Oral Daily  . ferrous sulfate  325 mg Oral Q breakfast  . isosorbide mononitrate  90 mg Oral Daily  . levETIRAcetam  500 mg Oral BID  . levothyroxine  112 mcg Oral QAC breakfast  . magnesium oxide  400 mg Oral Daily  . multivitamin with minerals  1 tablet Oral Daily  . pantoprazole  40 mg Oral BID  . potassium chloride SA  20 mEq Oral BID  . rosuvastatin  20 mg Oral q morning - 10a   Continuous  Infusions: . sodium chloride 125 mL/hr at 08/19/17 0600     LOS: 0 days    Time spent: More than 50% of that time was spent in counseling and/or coordination of care.      Shelly Coss, MD [facility] Pager [phone]  If 7PM-7AM, please contact night-coverage [email or web address] Password [email or web address] 08/19/2017, 1:28 PM

## 2017-08-20 ENCOUNTER — Other Ambulatory Visit: Payer: Self-pay

## 2017-08-20 DIAGNOSIS — I951 Orthostatic hypotension: Principal | ICD-10-CM

## 2017-08-20 LAB — CBC
HEMATOCRIT: 35 % — AB (ref 36.0–46.0)
HEMOGLOBIN: 11.2 g/dL — AB (ref 12.0–15.0)
MCH: 28.6 pg (ref 26.0–34.0)
MCHC: 32 g/dL (ref 30.0–36.0)
MCV: 89.5 fL (ref 78.0–100.0)
PLATELETS: 293 10*3/uL (ref 150–400)
RBC: 3.91 MIL/uL (ref 3.87–5.11)
RDW: 12.5 % (ref 11.5–15.5)
WBC: 6 10*3/uL (ref 4.0–10.5)

## 2017-08-20 LAB — BASIC METABOLIC PANEL
Anion gap: 9 (ref 5–15)
BUN: <5 mg/dL — ABNORMAL LOW (ref 8–23)
CHLORIDE: 111 mmol/L (ref 98–111)
CO2: 23 mmol/L (ref 22–32)
CREATININE: 0.99 mg/dL (ref 0.44–1.00)
Calcium: 6.9 mg/dL — ABNORMAL LOW (ref 8.9–10.3)
GFR calc Af Amer: >60 mL/min (ref >60)
GFR calc non Af Amer: 56 mL/min — ABNORMAL LOW (ref >60)
Glucose, Bld: 105 mg/dL — ABNORMAL HIGH (ref 70–99)
POTASSIUM: 3.9 mmol/L (ref 3.5–5.1)
Sodium: 143 mmol/L (ref 135–145)

## 2017-08-20 LAB — MAGNESIUM: Magnesium: 2.3 mg/dL (ref 1.7–2.4)

## 2017-08-20 MED ORDER — LOPERAMIDE HCL 2 MG PO CAPS: 2.0000 mg | ORAL_CAPSULE | Freq: Four times a day (QID) | ORAL | 0 refills | Status: DC | PRN

## 2017-08-20 MED ORDER — ISOSORBIDE MONONITRATE ER 30 MG PO TB24
30.0000 mg | ORAL_TABLET | Freq: Every day | ORAL | Status: DC
  Administered 2017-08-20: 30 mg via ORAL
  Filled 2017-08-20: qty 1

## 2017-08-20 MED ORDER — CHOLESTYRAMINE 4 G PO PACK
4.0000 g | PACK | ORAL | Status: DC
  Administered 2017-08-20: 4 g via ORAL
  Filled 2017-08-20 (×2): qty 1

## 2017-08-20 MED ORDER — DILTIAZEM HCL 30 MG PO TABS: 30.0000 mg | ORAL_TABLET | Freq: Three times a day (TID) | ORAL | 11 refills | Status: DC

## 2017-08-20 MED ORDER — CHOLESTYRAMINE 4 G PO PACK: 4.0000 g | PACK | Freq: Three times a day (TID) | ORAL | 12 refills | Status: DC

## 2017-08-20 MED ORDER — ELIQUIS 5 MG VTE STARTER PACK: ORAL_TABLET | ORAL | 0 refills | Status: DC

## 2017-08-20 MED ORDER — SACCHAROMYCES BOULARDII 250 MG PO CAPS
250.0000 mg | ORAL_CAPSULE | Freq: Two times a day (BID) | ORAL | Status: DC
  Administered 2017-08-20: 250 mg via ORAL
  Filled 2017-08-20: qty 1

## 2017-08-20 MED ORDER — ALPRAZOLAM 0.25 MG PO TABS
0.2500 mg | ORAL_TABLET | Freq: Three times a day (TID) | ORAL | Status: DC | PRN
Start: 2017-08-20 — End: 2017-08-20

## 2017-08-20 MED ORDER — CHOLESTYRAMINE 4 G PO PACK: 4.0000 g | PACK | Freq: Two times a day (BID) | ORAL | 12 refills | Status: DC

## 2017-08-20 MED ORDER — SACCHAROMYCES BOULARDII 250 MG PO CAPS: 250.0000 mg | ORAL_CAPSULE | Freq: Two times a day (BID) | ORAL | 0 refills | Status: DC

## 2017-08-20 NOTE — Progress Notes (Signed)
Call placed to [facility] to notify of telemetry monitoring d/c.   

## 2017-08-20 NOTE — Progress Notes (Signed)
Physical Therapy Treatment Patient Details Name: [name] MRN: [id number] DOB: November 28, 1945 Today's Date: 08/20/2017    History of Present Illness 72 y.o. female admitted from cardiologist office after becoming orthostatic. Pt has had 2 week bouts of diarrhea, negative for c diff. GI consulted. PMH includes but not limited to:  IBS, Seizures, Pulmonary fibrosis, HTN, HLD, Gout, Depression, CAD, CKD III,     PT Comments    Patient doing well with therapy today, walking with improved balance and velocity, progressing to stairs this visit. No dizziness noted, RN reports she will be doing orthostatics shortly. Pt min guard level for stairs and reports her husband will be helping her into home after d/c. Pt eager to return home, denies diarrhea today. HHPT still appropriate.       Follow Up Recommendations  Home health PT;Supervision/Assistance - 24 hour     Equipment Recommendations  Other (comment)    Recommendations for Other Services       Precautions / Restrictions Restrictions Weight Bearing Restrictions: No    Mobility  Bed Mobility Overal bed mobility: Needs Assistance Bed Mobility: Sit to Supine       Sit to supine: Supervision      Transfers Overall transfer level: Needs assistance Equipment used: Rolling walker (2 wheeled) Transfers: Sit to/from Stand Sit to Stand: Supervision         General transfer comment: Supervision this visit  Ambulation/Gait Ambulation/Gait assistance: Supervision Gait Distance (Feet): 100 Feet Assistive device: IV Pole;None Gait Pattern/deviations: Step-to pattern;Step-through pattern Gait velocity: decreased   General Gait Details: improved velocity, still mild unsteadiness noted.    Stairs Stairs: Yes Stairs assistance: Min guard Stair Management: One rail Left;One rail Right Number of Stairs: 5 General stair comments: alternating step up, step to step on way down. pt no overt balance, min guard for safety, advised  patient to have husband with her she assured me she would   Wheelchair Mobility    Modified Rankin (Stroke Patients Only)       Balance Overall balance assessment: Needs assistance   Sitting balance-Leahy Scale: Good       Standing balance-Leahy Scale: Fair                              Cognition Arousal/Alertness: Lethargic Behavior During Therapy: Flat affect                                          Exercises      General Comments        Pertinent Vitals/Pain      Home Living                      Prior Function            PT Goals (current goals can now be found in the care plan section) Acute Rehab PT Goals Patient Stated Goal: feel better PT Goal Formulation: With patient Time For Goal Achievement: 08/26/17 Potential to Achieve Goals: Good Progress towards PT goals: Progressing toward goals    Frequency    Min 3X/week      PT Plan Current plan remains appropriate    Co-evaluation              AM-PAC PT "6 Clicks" Daily Activity  Outcome Measure  Difficulty turning over  in bed (including adjusting bedclothes, sheets and blankets)?: A Little Difficulty moving from lying on back to sitting on the side of the bed? : A Little Difficulty sitting down on and standing up from a chair with arms (e.g., wheelchair, bedside commode, etc,.)?: A Little Help needed moving to and from a bed to chair (including a wheelchair)?: A Little Help needed walking in hospital room?: A Little Help needed climbing 3-5 steps with a railing? : A Little 6 Click Score: 18    End of Session Equipment Utilized During Treatment: Gait belt Activity Tolerance: Patient limited by fatigue Patient left: in bed;with call bell/phone within reach;with family/visitor present   PT Visit Diagnosis: Unsteadiness on feet (R26.81);Other abnormalities of gait and mobility (R26.89);Muscle weakness (generalized) (M62.81)     Time:  3888-7579 PT Time Calculation (min) (ACUTE ONLY): 20 min  Charges:  $Gait Training: 8-22 mins                    Reinaldo Berber, PT, DPT Acute Rehab Services Pager: [phone]     Reinaldo Berber 08/20/2017, 3:20 PM

## 2017-08-20 NOTE — Progress Notes (Signed)
Per Dr Rodena Piety, pt will be discharged today; as her orthostatics were much improved.   Pharmacy states that they educated pt on eliquis yesterday, they will come again if pt has specific questions.

## 2017-08-20 NOTE — Progress Notes (Signed)
Patient sleeping during shift report.      

## 2017-08-20 NOTE — Discharge Summary (Signed)
Physician Discharge Summary  [name] LPF:[id number] DOB: 07-Jun-1945 DOA: 08/18/2017  PCP: Carol Ada, MD  Admit date: 08/18/2017 Discharge date: 08/20/2017  Admitted From: home  Disposition:  home  Recommendations for Outpatient Follow-up:  1. Follow up with PCP in 1-2 weeks 2. Please obtain BMP/CBC in one week   Home Health  pt Equipment/Devices:none Discharge Condition: stable CODE STATUSfull Diet recommendation:  cardiac Brief/Interim Summary:72 y.o.femalewith medical history significant ofcoronary artery disease, pulmonary fibrosis, CKD stage II-III,hypothyroidism, hypertension, hyperlipidemia who presented to the emergency department after she was sent from her cardiologist's office for the evaluation of orthostatic hypotension. Apparently her systolic blood pressure dropped from 120 mmHg to 80 mmHg while she was moved from supine to sitting position.Patient was complaining of dizziness and lightheadedness since last 2 weeks. Patient reports of having diarrhea for the same time.Patient was found to be orthostatic on presentation here in the ED. D-dimer was mildly elevated.  Venous duplex came out to be positive for  acute deep vein thrombosis involving left Peroneal veins mid to distal segment. Started on Eliquis. Discharge Diagnoses:  Principal Problem:   Orthostatic hypotension Active Problems:   Hypertension   Hyperlipidemia   History of drug coated stent   Hypothyroidism   History of  seizures   Depression   Pulmonary fibrosis (HCC)   Diarrhea   DVT (deep venous thrombosis) (HCC)  Orthostatic hypotension:Patient was complaining of dizziness and lightheadedness since last 2 weeks. Patient was found to be profoundly orthostatic.  She was Started on IV fluids.   On the day of discharge patient walked with physical therapy and was no more orthostatic.  And they recommended home health with PT.  Her Bystolic, Lasix were on hold and Cardizem was restarted at a  lower dose.  Patient to follow-up with Dr. Radford Pax as an outpatient.  Diarrhea: Has been going on for a while. She also has history of irritable bowel syndrome.Negative C. difficile and GI pathogen panel. She needs GI evaluation as an inpatient/outpatient.  Will request for GI evaluation here .Apparently patient became volume depleted and orthostatic due to chronic diarrhea.  Started on Imodium.  Follow-up with GI as an outpatient for chronic diarrhea.  Lower extremity ZHG:DJMEQA duplex came out to be positive for  acute deep vein thrombosis involving left Peroneal veins mid to distal segment. Started on Eliquis.  Discharged on Eliquis.  Mild AKI on CKD stage II-III:Kidney function near baseline. Follows with nephrology Dr. Olivia Mackie at [facility].  Improved with hydration.  Severe hypomagnesemia/hypokalemia: Most likely secondary to diarrhea.  Resolved.  History of coronary disease:Status post inferior posterior MI in 2011. She is status post stents x3. Continue aspirin and Plavix. History of PSVT: will restart Cardizem .  Hypothyroidism: Continue Synthyroid  Depression/anxiety: Continue her home meds  Elevated d-dimer: VenousDuplex showed DVT.  History of pulmonary fibrosis: Not on oxygen at home. Currently stable  Seizure disorder:On Keppra at home which we will continue.  Deconditioning/debility/generalized weakness:PT recommends home health PT.  Discharge Instructions  Discharge Instructions    Call MD for:  difficulty breathing, headache or visual disturbances   Complete by:  As directed    Call MD for:  persistant dizziness or light-headedness   Complete by:  As directed    Call MD for:  persistant nausea and vomiting   Complete by:  As directed    Call MD for:  severe uncontrolled pain   Complete by:  As directed    Call MD for:  temperature >100.4  Complete by:  As directed    Diet - low sodium heart healthy   Complete by:  As directed     Increase activity slowly   Complete by:  As directed      Allergies as of 08/20/2017   No Known Allergies     Medication List    STOP taking these medications   BYSTOLIC 5 MG tablet Generic drug:  nebivolol   diltiazem 240 MG 24 hr capsule Commonly known as:  CARDIZEM CD   furosemide 40 MG tablet Commonly known as:  LASIX   HYDROcodone-acetaminophen 10-325 MG tablet Commonly known as:  NORCO   isosorbide mononitrate 60 MG 24 hr tablet Commonly known as:  IMDUR   losartan 50 MG tablet Commonly known as:  COZAAR   Magnesium Oxide 400 (240 Mg) MG Tabs   potassium chloride SA 20 MEQ tablet Commonly known as:  K-DUR,KLOR-CON     TAKE these medications   ALPRAZolam 0.25 MG tablet Commonly known as:  XANAX Take 0.25 mg by mouth 3 (three) times daily.   aspirin EC 81 MG tablet Take 81 mg by mouth daily.   calcitRIOL 0.25 MCG capsule Commonly known as:  ROCALTROL Take 0.25 mcg by mouth daily.   cholestyramine 4 g packet Commonly known as:  QUESTRAN Take 1 packet (4 g total) by mouth 2 (two) times daily.   clopidogrel 75 MG tablet Commonly known as:  PLAVIX TAKE 1 TABLET (75 MG TOTAL) BY MOUTH DAILY WITH BREAKFAST.   diltiazem 30 MG tablet Commonly known as:  CARDIZEM Take 1 tablet (30 mg total) by mouth 3 (three) times daily.   DULoxetine 30 MG capsule Commonly known as:  CYMBALTA Take 30 mg by mouth daily.   ELIQUIS STARTER PACK 5 MG Tabs .Take two 5 mg tabs twice daily for 6 days then on day 7 switch to one 5 mg tablet twice daily.   ferrous sulfate 325 (65 FE) MG tablet Take 325 mg by mouth daily with breakfast.   levETIRAcetam 500 MG tablet Commonly known as:  KEPPRA TAKE 1 TABLET BY MOUTH TWICE A DAY   levothyroxine 112 MCG tablet Commonly known as:  SYNTHROID, LEVOTHROID Take 112 mcg by mouth daily.   loperamide 2 MG capsule Commonly known as:  IMODIUM Take 1 capsule (2 mg total) by mouth every 6 (six) hours as needed for diarrhea or loose  stools.   nitroGLYCERIN 0.4 MG SL tablet Commonly known as:  NITROSTAT PLACE 1 TABLET (0.4 MG TOTAL) UNDER THE TONGUE EVERY 5 (FIVE) MINUTES AS NEEDED FOR CHEST PAIN.   pantoprazole 40 MG tablet Commonly known as:  PROTONIX TAKE 1 TABLET (40 MG TOTAL) BY MOUTH 2 (TWO) TIMES DAILY.   rosuvastatin 20 MG tablet Commonly known as:  CRESTOR TAKE 1 TABLET (20 MG TOTAL) BY MOUTH EVERY MORNING. What changed:  when to take this   saccharomyces boulardii 250 MG capsule Commonly known as:  FLORASTOR Take 1 capsule (250 mg total) by mouth 2 (two) times daily.      Follow-up Information    Carol Ada, MD Follow up.   Specialty:  Family Medicine Contact information: [address] [place] [address] [phone]        Sueanne Margarita, MD Follow up.   Specialty:  Cardiology Contact information: [address] [place] [address] [phone]        Irene Shipper, MD Follow up.   Specialty:  Gastroenterology Contact information:  [address] [address] [phone]          No Known Allergies  Consultations:  NONE   Procedures/Studies: Ct Angio Chest Pe W And/or Wo Contrast  Result Date: 08/18/2017 CLINICAL DATA:  Weakness and fatigue, reports shortness of breath. States symptoms x2 weeks. Reports dark stools, however also reports takes daily iron. Pt is a/o x4. Pale on arrival, conjunctivae pale. EXAM: CT ANGIOGRAPHY CHEST WITH CONTRAST TECHNIQUE: Multidetector CT imaging of the chest was performed using the standard protocol during bolus administration of intravenous contrast. Multiplanar CT image reconstructions and MIPs were obtained to evaluate the vascular anatomy. CONTRAST:  160mL ISOVUE-370 IOPAMIDOL (ISOVUE-370) INJECTION 76% COMPARISON:  Chest x-ray on 08/18/2017, chest CT on 08/20/2015 FINDINGS: Cardiovascular: Pulmonary arteries are well opacified. There is no acute pulmonary embolus. There is mitral annulus  calcification. Dense atherosclerotic calcification of the coronary arteries. Trace pericardial effusion. The heart is mildly enlarged. There are focal calcifications of the thoracic aorta not associated with aneurysm. Mediastinum/Nodes: Median sternotomy. The visualized portion of the thyroid gland has a normal appearance. Esophagus is normal in appearance. No mediastinal, hilar, or axillary adenopathy. Lungs/Pleura: There are dependent patchy areas of ground-glass density and smooth septal thickening. No focal consolidations or pleural effusions. No suspicious pulmonary nodules. There are focal areas of pleural calcification. Upper Abdomen: Stable elevation of the RIGHT hemidiaphragm. Gallbladder is present. No acute abnormality. Musculoskeletal: Median sternotomy. Remote rib fractures. Midthoracic degenerative changes. Review of the MIP images confirms the above findings. IMPRESSION: 1. Normal opacification of pulmonary arteries. No acute pulmonary embolus. 2. Coronary artery and mitral calcification. 3. Mild cardiomegaly and mild pulmonary edema. 4.  Aortic Atherosclerosis (ICD10-I70.0). Electronically Signed   By: Nolon Nations M.D.   On: 08/18/2017 17:47   Dg Chest Portable 1 View  Result Date: 08/18/2017 CLINICAL DATA:  Lightheadedness today, syncope last week. EXAM: PORTABLE CHEST 1 VIEW COMPARISON:  Chest x-ray dated 02/19/2016. FINDINGS: Heart size and mediastinal contours are stable. Median sternotomy wires appear intact and stable in alignment. Stable chronic elevation of the RIGHT hemidiaphragm. Lungs are clear. No pleural effusion or pneumothorax seen. No acute or suspicious osseous finding. IMPRESSION: No active disease.  No evidence of pneumonia or pulmonary edema. Electronically Signed   By: Franki Cabot M.D.   On: 08/18/2017 13:08    (Echo, Carotid, EGD, Colonoscopy, ERCP)    Subjective:   Discharge Exam: Vitals:   08/19/17 2031 08/20/17 0432  BP: 122/74 (!) 113/96  Pulse: 100 90   Resp: 16 18  Temp: (!) 97.5 F (36.4 C) 97.7 F (36.5 C)  SpO2: 96% 95%   Vitals:   08/19/17 0759 08/19/17 1628 08/19/17 2031 08/20/17 0432  BP: 126/66 108/70 122/74 (!) 113/96  Pulse: 80 85 100 90  Resp: 19 18 16 18   Temp: 98.9 F (37.2 C) 98.8 F (37.1 C) (!) 97.5 F (36.4 C) 97.7 F (36.5 C)  TempSrc:  Oral Oral Oral  SpO2: 98% 96% 96% 95%  Weight:    67.6 kg  Height:        General: Pt is alert, awake, not in acute distress Cardiovascular: RRR, S1/S2 +, no rubs, no gallops Respiratory: CTA bilaterally, no wheezing, no rhonchi Abdominal: Soft, NT, ND, bowel sounds + Extremities: no edema, no cyanosis    The results of significant diagnostics from this hospitalization (including imaging, microbiology, ancillary and laboratory) are listed below for reference.     Microbiology: Recent Results (from the past 240 hour(s))  Gastrointestinal Panel by PCR , Stool     Status: None   Collection Time: 08/18/17  9:00 PM  Result Value Ref Range Status   Campylobacter species NOT DETECTED NOT DETECTED Final   Plesimonas shigelloides NOT DETECTED NOT DETECTED Final   Salmonella species NOT DETECTED NOT DETECTED Final   Yersinia enterocolitica NOT DETECTED NOT DETECTED Final   Vibrio species NOT DETECTED NOT DETECTED Final   Vibrio cholerae NOT DETECTED NOT DETECTED Final   Enteroaggregative E coli (EAEC) NOT DETECTED NOT DETECTED Final   Enteropathogenic E coli (EPEC) NOT DETECTED NOT DETECTED Final   Enterotoxigenic E coli (ETEC) NOT DETECTED NOT DETECTED Final   Shiga like toxin producing E coli (STEC) NOT DETECTED NOT DETECTED Final   Shigella/Enteroinvasive E coli (EIEC) NOT DETECTED NOT DETECTED Final   Cryptosporidium NOT DETECTED NOT DETECTED Final   Cyclospora cayetanensis NOT DETECTED NOT DETECTED Final   Entamoeba histolytica NOT DETECTED NOT DETECTED Final   Giardia lamblia NOT DETECTED NOT DETECTED Final   Adenovirus F40/41 NOT DETECTED NOT DETECTED Final    Astrovirus NOT DETECTED NOT DETECTED Final   Norovirus GI/GII NOT DETECTED NOT DETECTED Final   Rotavirus A NOT DETECTED NOT DETECTED Final   Sapovirus (I, II, IV, and V) NOT DETECTED NOT DETECTED Final    Comment: Performed at [facility], [address] [place], [place] [address]  C difficile quick scan w PCR reflex     Status: None   Collection Time: 08/18/17  9:00 PM  Result Value Ref Range Status   C Diff antigen NEGATIVE NEGATIVE Final   C Diff toxin NEGATIVE NEGATIVE Final   C Diff interpretation No C. difficile detected.  Final     Labs: BNP (last 3 results) No results for input(s): BNP in the last 8760 hours. Basic Metabolic Panel: Recent Labs  Lab 08/18/17 1154 08/18/17 1300 08/19/17 0550 08/20/17 0417  NA 141  --  142 143  K 3.7  --  3.3* 3.9  CL 106  --  106 111  CO2 23  --  21* 23  GLUCOSE 162*  --  97 105*  BUN 15  --  9 <5*  CREATININE 1.42*  --  1.22* 0.99  CALCIUM 7.0*  --  6.8* 6.9*  MG  --  1.0* 0.8* 2.3   Liver Function Tests: Recent Labs  Lab 08/18/17 1300  AST 14*  ALT 9  ALKPHOS 76  BILITOT 0.8  PROT 7.2  ALBUMIN 3.2*   Recent Labs  Lab 08/18/17 1300  LIPASE 30   No results for input(s): AMMONIA in the last 168 hours. CBC: Recent Labs  Lab 08/18/17 1154 08/19/17 0550 08/20/17 1245  WBC 8.3 7.5 6.0  HGB 12.3 10.5* 11.2*  HCT 38.0 32.8* 35.0*  MCV 90.0 89.6 89.5  PLT 327 269 293   Cardiac Enzymes: No results for input(s): CKTOTAL, CKMB, CKMBINDEX, TROPONINI in the last 168 hours. BNP: Invalid input(s): POCBNP CBG: No results for input(s): GLUCAP in the last 168 hours. D-Dimer Recent Labs    08/18/17 1528  DDIMER 1.08*   Hgb A1c No results for input(s): HGBA1C in the last 72 hours. Lipid Profile No results for input(s): CHOL, HDL, LDLCALC, TRIG, CHOLHDL, LDLDIRECT in the last 72 hours. Thyroid function studies Recent Labs    08/18/17 1300  TSH 1.063   Anemia work up No results for input(s):  VITAMINB12, FOLATE, FERRITIN, TIBC, IRON, RETICCTPCT in the last 72 hours. Urinalysis    Component  Value Date/Time   COLORURINE STRAW (A) 08/18/2017 1528   APPEARANCEUR CLEAR 08/18/2017 1528   LABSPEC 1.005 08/18/2017 1528   PHURINE 5.0 08/18/2017 1528   GLUCOSEU NEGATIVE 08/18/2017 1528   HGBUR MODERATE (A) 08/18/2017 1528   BILIRUBINUR NEGATIVE 08/18/2017 1528   KETONESUR NEGATIVE 08/18/2017 1528   PROTEINUR NEGATIVE 08/18/2017 1528   UROBILINOGEN 0.2 02/14/2014 1643   NITRITE NEGATIVE 08/18/2017 1528   LEUKOCYTESUR NEGATIVE 08/18/2017 1528   Sepsis Labs Invalid input(s): PROCALCITONIN,  WBC,  LACTICIDVEN Microbiology Recent Results (from the past 240 hour(s))  Gastrointestinal Panel by PCR , Stool     Status: None   Collection Time: 08/18/17  9:00 PM  Result Value Ref Range Status   Campylobacter species NOT DETECTED NOT DETECTED Final   Plesimonas shigelloides NOT DETECTED NOT DETECTED Final   Salmonella species NOT DETECTED NOT DETECTED Final   Yersinia enterocolitica NOT DETECTED NOT DETECTED Final   Vibrio species NOT DETECTED NOT DETECTED Final   Vibrio cholerae NOT DETECTED NOT DETECTED Final   Enteroaggregative E coli (EAEC) NOT DETECTED NOT DETECTED Final   Enteropathogenic E coli (EPEC) NOT DETECTED NOT DETECTED Final   Enterotoxigenic E coli (ETEC) NOT DETECTED NOT DETECTED Final   Shiga like toxin producing E coli (STEC) NOT DETECTED NOT DETECTED Final   Shigella/Enteroinvasive E coli (EIEC) NOT DETECTED NOT DETECTED Final   Cryptosporidium NOT DETECTED NOT DETECTED Final   Cyclospora cayetanensis NOT DETECTED NOT DETECTED Final   Entamoeba histolytica NOT DETECTED NOT DETECTED Final   Giardia lamblia NOT DETECTED NOT DETECTED Final   Adenovirus F40/41 NOT DETECTED NOT DETECTED Final   Astrovirus NOT DETECTED NOT DETECTED Final   Norovirus GI/GII NOT DETECTED NOT DETECTED Final   Rotavirus A NOT DETECTED NOT DETECTED Final   Sapovirus (I, II, IV, and V) NOT  DETECTED NOT DETECTED Final    Comment: Performed at [facility], [address] [place],  [address]  C difficile quick scan w PCR reflex     Status: None   Collection Time: 08/18/17  9:00 PM  Result Value Ref Range Status   C Diff antigen NEGATIVE NEGATIVE Final   C Diff toxin NEGATIVE NEGATIVE Final   C Diff interpretation No C. difficile detected.  Final     Time coordinating discharge: 37 minutes  SIGNED:   Georgette Shell, MD  [facility] 08/20/2017, 3:42 PM Pager   If 7PM-7AM, please contact night-coverage [email or web address] Password [email or web address]

## 2017-08-20 NOTE — Care Management Note (Signed)
Case Management Note CM weekend coverage for 3E [phone]  Patient Details  Name: [name] MRN: [id number] Date of Birth: 06-16-1945  Subjective/Objective:                    Action/Plan: Noted orders for HHPT and new Eliquis. Pt for d/c home today with family. Spoke with pt at bedside, choice offered for [facility] agency- per pt she would like agency that is in-network with insurance- fine with [facility]- 30 day free card provided for Eliquis. Referral called to [facility] with [facility] for HHPT needs- referral accepted.   Expected Discharge Date:  08/20/17               Expected Discharge Plan:  [place]  In-House Referral:     Discharge planning Services  CM Consult, Medication Assistance  Post Acute Care Choice:  Home Health Choice offered to:  Patient  DME Arranged:    DME Agency:     HH Arranged:  PT [place]:  [place]  Status of Service:  Completed, signed off  If discussed at [place] of Stay Meetings, dates discussed:    Discharge Disposition: home/home health   Additional Comments:  Dawayne Patricia, RN 08/20/2017, 5:14 PM

## 2017-08-20 NOTE — Progress Notes (Signed)
Patient states she did not hear more than the name of the medication from pharmacy when they 'educated' on eliquis yesterday, requested pt have repeat visit from pharmacy today prior to discharge.

## 2017-08-22 ENCOUNTER — Telehealth: Payer: Self-pay | Admitting: Cardiology

## 2017-08-22 ENCOUNTER — Telehealth: Payer: Self-pay

## 2017-08-22 NOTE — Telephone Encounter (Signed)
Call to the patient. No answer. Left a message to return my call. Checking to see how she is doing since being discharged.

## 2017-08-22 NOTE — Telephone Encounter (Signed)
-----   Message from Willia Craze, NP sent at 08/20/2017  9:49 AM EDT ----- [name] patient probably leaving hospital over wkend. Can you call on Monday and see how she is. If imodium not helping then try lomotil bid prn. Also, please get her follow up with me.  Thanks

## 2017-08-22 NOTE — Telephone Encounter (Signed)
Pt recently hospitalized for AKI, Hypotension with Syncope, and Fatigue.  Pt asked that I speak with her husband in regards to medications.  Husband concerned that Bystolic, Furosemide, K+, and Imdur were stopped and Diltiazem was changed.  Advised husband these were likely changed d/t kidney fx and Hypotension.  Crea on 8/8 was 1.42, 8/9 was 1.22, and 8/10 was 0.99.  Husband states BPs have been good since coming home but did not have the values.  Advised husband to monitor BP's BID and contact us on Thurs with those readings so we can send message to Dr Radford Pax to see if pt needed any medication changes.  Husband verbalized understanding and was in agreement with this plan.

## 2017-08-22 NOTE — Telephone Encounter (Signed)
New problem    Pt was in the hospital for past 3 days and was taken off the medication that Dr Radford Pax put her on. Pt wish to speak to nurse because she want to know what to do about medication.

## 2017-08-23 DIAGNOSIS — I10 Essential (primary) hypertension: Secondary | ICD-10-CM | POA: Diagnosis not present

## 2017-08-23 DIAGNOSIS — I82492 Acute embolism and thrombosis of other specified deep vein of left lower extremity: Secondary | ICD-10-CM | POA: Diagnosis not present

## 2017-08-23 DIAGNOSIS — E559 Vitamin D deficiency, unspecified: Secondary | ICD-10-CM | POA: Diagnosis not present

## 2017-08-23 DIAGNOSIS — R197 Diarrhea, unspecified: Secondary | ICD-10-CM | POA: Diagnosis not present

## 2017-08-23 DIAGNOSIS — I471 Supraventricular tachycardia: Secondary | ICD-10-CM | POA: Diagnosis not present

## 2017-08-24 ENCOUNTER — Other Ambulatory Visit: Payer: Self-pay | Admitting: Podiatry

## 2017-08-24 ENCOUNTER — Ambulatory Visit: Payer: PPO | Admitting: Podiatry

## 2017-08-24 ENCOUNTER — Ambulatory Visit (INDEPENDENT_AMBULATORY_CARE_PROVIDER_SITE_OTHER): Payer: PPO

## 2017-08-24 ENCOUNTER — Encounter: Payer: Self-pay | Admitting: Podiatry

## 2017-08-24 ENCOUNTER — Ambulatory Visit: Payer: PPO

## 2017-08-24 DIAGNOSIS — M7752 Other enthesopathy of left foot: Secondary | ICD-10-CM | POA: Diagnosis not present

## 2017-08-24 DIAGNOSIS — M25572 Pain in left ankle and joints of left foot: Secondary | ICD-10-CM

## 2017-08-24 DIAGNOSIS — R600 Localized edema: Secondary | ICD-10-CM

## 2017-08-24 DIAGNOSIS — M79672 Pain in left foot: Secondary | ICD-10-CM | POA: Diagnosis not present

## 2017-08-24 DIAGNOSIS — M779 Enthesopathy, unspecified: Secondary | ICD-10-CM

## 2017-08-24 MED ORDER — TRIAMCINOLONE ACETONIDE 10 MG/ML IJ SUSP
10.0000 mg | Freq: Once | INTRAMUSCULAR | Status: AC
  Administered 2017-08-24: 10 mg

## 2017-08-24 NOTE — Progress Notes (Signed)
Subjective:   Patient ID: [name], female   DOB: 72 y.o.   MRN: [id number]   HPI Patient presents stating she has developed a lot of pain in the outside of her left ankle and she has been in the hospital with blood clots and dehydration   ROS      Objective:  Physical Exam  Neurovascular status intact with patient found to have +1 pitting edema around the left ankle with irritation associated with the swelling and no compression and is noted to have inflammation pain of the left sinus tarsi     Assessment:  Inflammatory capsulitis left sinus tarsi along with edema with history of clotting part of the problem     Plan:  Reviewed condition today and at this point I went ahead and injected the sinus tarsi left 3 mg Kenalog 5 mg Xylocaine and I applied an Unna boot to take all stress off the foot and to reduce edema instructed to leave it on 3 days and take it off earlier if any issues should occur.  Dispensed surgical shoe reappoint to recheck again in the next several weeks  X-ray indicates no indications of arthritis or stress fracture

## 2017-08-24 NOTE — Telephone Encounter (Signed)
Called the patient again. She answers. States the diarrhea has resolved. Normal bowel movements. No nausea or abdominal pain. Feels she is doing well from a GI standpoint.  She mentions she is having bleeding of her gums when she brushes her teeth. She sees blood on her pillow from drooling during her sleep. Uses an electric toothbrush. States she is on Eliquis and Plavix. Agrees to call her cardiology office to discuss.

## 2017-08-25 ENCOUNTER — Telehealth: Payer: Self-pay | Admitting: Cardiology

## 2017-08-25 NOTE — Telephone Encounter (Signed)
I spoke with pt's husband who is calling to report recent BP/heart rate readings. Pt was recently hospitalized and medication changes were made.  See previous phone note.  Husband reports the following readings 8/12- AM- 140/91,101 PM-143/101,90 8/13- AM- 133/90,101 PM-134/84,91 8/14- AM-130/89,107  PM-137/84,92 8/15- AM-142/97,95  AM readings were taken 1-2 hours after morning medications.  I told husband I would send readings to Dr. Radford Pax for review.  Husband also reports pt has some bleeding from gums during the night while she is sleeping.  Is not excessive and stops on it's own.  No bleeding during the day.  I told husband this is due to blood thinners and is OK.  I told him to let us know if it worsens.

## 2017-08-25 NOTE — Telephone Encounter (Signed)
New Message:      Pt's wife is calling to give some readings of the pt's BP per request of Dr. Radford Pax.

## 2017-08-26 NOTE — Telephone Encounter (Signed)
I spoke with pt's husband and told him Dr. Radford Pax was out of the office until Monday. I told him we would call back once readings reviewed by Dr. Radford Pax.

## 2017-08-26 NOTE — Telephone Encounter (Signed)
F/u    [name] is calling and stated he is waiting for nurse to call back with feed back on his wife's BP reading that was given to Dr Radford Pax. Please call to advise

## 2017-08-29 NOTE — Telephone Encounter (Signed)
Restart Bystolic at 2.5mg  daily and change Cardizem to CD 120mg  daily.  Please have her followup with extender later this week

## 2017-08-30 MED ORDER — DILTIAZEM HCL ER COATED BEADS 120 MG PO CP24: 120.0000 mg | ORAL_CAPSULE | Freq: Every day | ORAL | 3 refills | Status: DC

## 2017-08-30 MED ORDER — NEBIVOLOL HCL 2.5 MG PO TABS: 2.5000 mg | ORAL_TABLET | Freq: Every day | ORAL | 3 refills | Status: DC

## 2017-08-30 NOTE — Telephone Encounter (Signed)
Spoke with patient regarding medication changes. She accepted starting Bystolic at 2.5 mg and Cardizem CD 120 mg daily. She is scheduled for a follow up with Leanor Kail, PA on 8/22 at 11 am. She had no further questions.

## 2017-08-31 DIAGNOSIS — F411 Generalized anxiety disorder: Secondary | ICD-10-CM | POA: Diagnosis not present

## 2017-08-31 DIAGNOSIS — R790 Abnormal level of blood mineral: Secondary | ICD-10-CM | POA: Diagnosis not present

## 2017-08-31 DIAGNOSIS — I10 Essential (primary) hypertension: Secondary | ICD-10-CM | POA: Diagnosis not present

## 2017-09-01 ENCOUNTER — Ambulatory Visit: Payer: PPO | Admitting: Physician Assistant

## 2017-09-01 ENCOUNTER — Encounter: Payer: Self-pay | Admitting: Physician Assistant

## 2017-09-01 VITALS — BP 164/90 | HR 106 | Ht 59.0 in | Wt 140.4 lb

## 2017-09-01 DIAGNOSIS — E7800 Pure hypercholesterolemia, unspecified: Secondary | ICD-10-CM | POA: Diagnosis not present

## 2017-09-01 DIAGNOSIS — I2510 Atherosclerotic heart disease of native coronary artery without angina pectoris: Secondary | ICD-10-CM

## 2017-09-01 DIAGNOSIS — K068 Other specified disorders of gingiva and edentulous alveolar ridge: Secondary | ICD-10-CM

## 2017-09-01 DIAGNOSIS — I10 Essential (primary) hypertension: Secondary | ICD-10-CM | POA: Diagnosis not present

## 2017-09-01 DIAGNOSIS — I951 Orthostatic hypotension: Secondary | ICD-10-CM | POA: Diagnosis not present

## 2017-09-01 NOTE — Patient Instructions (Addendum)
Medication Instructions:  Your physician has recommended you make the following change in your medication:  1.  STOP the Plavix  Labwork: None ordered  Testing/Procedures: None ordered  Follow-Up: Your physician recommends that you schedule a follow-up appointment in: WITH DR. Radford Pax AS PLANNED   Any Other Special Instructions Will Be Listed Below (If Applicable).     If you need a refill on your cardiac medications before your next appointment, please call your pharmacy.

## 2017-09-01 NOTE — Progress Notes (Signed)
Cardiology Office Note    Date:  09/01/2017   ID:  DESMA WILKOWSKI, DOB 01-26-1945, MRN [id number]  PCP:  Carol Ada, MD  Cardiologist:  Dr. Radford Pax  Chief Complaint: medication changes  History of Present Illness:   [name] is a 72 y.o. female with a hx of CAD (s/p inf-post MI 2011 s/p DES to RCA. Recurrent CP s/p DES to RCA 06/2010 and then DES to LAD 2014 with residual dz treated medically) , HTN, pericardial cyst s/p resection, dyslipidemia, chronic CP with esophageal strictures and dilatation, CKD stage III who recently admitted for DVT presents for follow up.   She underwent VATS procedure via mini thoracotomy syndrome with open lung bx and was diagnosed with interstitial lung disease. She has chronic CP that has been felt to be noncardiac in the past and has started to reoccur. She has chronic DOE.   She underwent a cardiopulmonary stress test by Pulmonary to evaluate her SOB and it showed deconditioning and poor HR response to exercise. At the request of the pulmonologist her CCB and BB were stoppedas they though her SOB was due to chronotropic incompetence.She then started having palpitations and was seen byher PCP,Dr. [name] was told to take bystolic PRN for palpitations.  She then saw Dr. [name] who put her back on Cardizem and dropped her amlodipine to 30m daily.A nuclear stress test was done in January 2018 which showed no ischemia. This was done for similar chest pain that she had in the past. This chest pain occurred around the time she lost her dog.  Recently noted orthostatic hypotension requiring medication adjustment and admission to hospital. Further work up showed L sided DVT. Started on Elquis.   Post hospital telephone encounter noted for elevated HR and blood pressure. Dr. TRadford Paxrestarted Bystolic at 25.0VWdaily and changed Cardizem to CD [id number].   Here today for follow up. She hasn't started her bystolic or cardizem yet. Her BP and  HR was normal at PCP office yesterday. BP of 164/90 today. She is on ASA 8146mnd Plavix 57m34m CAD. She is taking Eliquis 5mg 29m for DVT. Noted bleeding gums with brushing teeth. Scattered bruise. No melena or blood in stool. Has has intermittent LE edema requiring ACE warp. Her lasix was discontinued during visit with Dr. TurneRadford PaxPast Medical History:  Diagnosis Date  . Anxiety   . Arthritis   . Carpal tunnel syndrome, bilateral 07/31/2015  . Cataract   . Cervical spondylosis without myelopathy   . Chronic chest pain   . Chronic kidney disease   . Chronic low back pain   . Chronotropic incompetence   . CKD (chronic kidney disease), stage III (HCC) [place] Coronary artery disease    a. inf-post MI 2011 s/p DES to RCA. b. DES to RCA 06/2010. c. DES to LAD 2014, residual dz treated medically.  . Depression   . Esophageal stricture   . GERD (gastroesophageal reflux disease)   . Gross hematuria   . History of esophageal dilatation    FOR STRIUCTURE  . History of gout   . History of hiatal hernia   . History of kidney stones   . Hyperlipidemia   . Hypertension   . Hypothyroidism   . Internal hemorrhoids   . Irritable bowel syndrome   . Mild aortic stenosis    by echo 2018  . Obesity   . Orthostatic hypotension   . Partial seizure disorder (HCC) WamicROLOGIST--  DR WILLIS   NOCTURNAL  . PONV (postoperative nausea and vomiting)    hard time getting iv site-had to do neck stick 2 yrs ago  . Pulmonary fibrosis ([place])   . S/P pericardial cyst excision    02-05-2013  benign  . Seizures ([place])    none in yrs  . SVT (supraventricular tachycardia) ([place])   . Trigger finger, acquired 09/30/2015   Right middle finger  . Vitamin D deficiency     Past Surgical History:  Procedure Laterality Date  . BIOPSY OF MEDIASTINAL MASS N/A 02/05/2013   Procedure: RESECTION OF MEDIASTINAL MASS;  Surgeon: Ivin Poot, MD;  Location: [facility];  Service: Thoracic;  Laterality: N/A;  . CARDIAC  CATHETERIZATION Right 01/24/2015   Procedure: right femoral ARTERIAL LINE INSERTION;  Surgeon: Ivin Poot, MD;  Location: [facility];  Service: Thoracic;  Laterality: Right;  . CARDIOVASCULAR STRESS TEST  11-22-2013  dr Irish Lack   normal lexiscan study/  no ischemia/  normal LVF and wall motion/ ef 81%  . COLONOSCOPY  last one 2014  . CORONARY ANGIOGRAM  03/10/2011   Procedure: CORONARY ANGIOGRAM;  Surgeon: Jettie Booze, MD;  Location: [facility] CATH LAB;  Service: Cardiovascular;;  . CORONARY ANGIOPLASTY WITH STENT PLACEMENT  11-22-2009  dr Irish Lack   Acute inferoposterior MI/  ef 60%,  PCI with DES x1 to dRCA,  25%  mLAD  . CORONARY ANGIOPLASTY WITH STENT PLACEMENT  06-26-2010  dr Daneen Schick   Cutting balloon angioplasty to dRCA with DES x1,  40% mLAD (non-obstructive CAD)  . CYSTOSCOPY WITH RETROGRADE PYELOGRAM, URETEROSCOPY AND STENT PLACEMENT Right 02/13/2014   Procedure: CYSTOSCOPY WITH RETROGRADE PYELOGRAM, URETEROSCOPY AND STENT PLACEMENT;  Surgeon: Bernestine Amass, MD;  Location: [facility];  Service: Urology;  Laterality: Right;  . ECTOPIC PREGNANCY SURGERY  YRS AGO   SALPINGECTOMY  . ESOPHAGOGASTRODUODENOSCOPY N/A 02/15/2014   Procedure: ESOPHAGOGASTRODUODENOSCOPY (EGD);  Surgeon: Jerene Bears, MD;  Location: [facility];  Service: Endoscopy;  Laterality: N/A;  . ESOPHAGOGASTRODUODENOSCOPY (EGD) WITH ESOPHAGEAL DILATION  05-20-2011  . HOLMIUM LASER APPLICATION Right 02/15/2351   Procedure: HOLMIUM LASER APPLICATION;  Surgeon: Bernestine Amass, MD;  Location: [facility];  Service: Urology;  Laterality: Right;  . LEFT HEART CATHETERIZATION WITH CORONARY ANGIOGRAM N/A 03/14/2012   Procedure: LEFT HEART CATHETERIZATION WITH CORONARY ANGIOGRAM;  Surgeon: Sueanne Margarita, MD;  Location: [facility] CATH LAB;  Service: Cardiovascular;  Laterality: N/A;  Normal LM,  50% pLAD,  70% mLAD,  D2 50-70%, very tortuous LAD,  70-82% ostial LCFX,  50% in-stent restenosis of  dRCA and mRCA   stent and 50-70% ostial PDA,  normal LVSF, ef 60%  . LUNG BIOPSY Right 01/24/2015   Procedure: RIGHT LUNG BIOPSY;  Surgeon: Ivin Poot, MD;  Location: [facility];  Service: Thoracic;  Laterality: Right;  . MEDIASTERNOTOMY N/A 02/05/2013   Procedure: MEDIAN STERNOTOMY;  Surgeon: Ivin Poot, MD;  Location: [facility];  Service: Thoracic;  Laterality: N/A;  . MEDIASTERNOTOMY Left 02/05/2013  . NEEDLE GUIDED EXCISION BREAST CALCIFICATIONS Right 08-09-2008  . PERCUTANEOUS CORONARY STENT INTERVENTION (PCI-S) N/A 04/03/2012   Procedure: PERCUTANEOUS CORONARY STENT INTERVENTION (PCI-S);  Surgeon: Jettie Booze, MD;  Location: [facility] CATH LAB;  Service: Cardiovascular;  Laterality: N/A;   Successful PCI  mLAD with 2.75x12 Promus stent, postdilated to >0.27m  . THORACOTOMY Right 01/24/2015   Procedure: RIGHT MINI/LIMITED THORACOTOMY;  Surgeon: PIvin Poot MD;  [place]  Service:  Thoracic;  Laterality: Right;  . TRANSTHORACIC ECHOCARDIOGRAM  11-17-2011   grade I diastolic dysfunction/  ef 55-60%  . VIDEO BRONCHOSCOPY N/A 01/24/2015   Procedure: RIGHT VIDEO BRONCHOSCOPY;  Surgeon: Ivin Poot, MD;  Location: [facility] OR;  Service: Thoracic;  Laterality: N/A;    Current Medications:  Prior to Admission medications   Medication Sig Start Date End Date Taking? Authorizing Provider  ALPRAZolam (XANAX) 0.25 MG tablet Take 0.25 mg by mouth 3 (three) times daily.    Yes [provider]  aspirin EC 81 MG tablet Take 81 mg by mouth daily.   Yes [provider]  calcitRIOL (ROCALTROL) 0.25 MCG capsule Take 0.25 mcg by mouth daily.    Yes [provider]  cholestyramine (QUESTRAN) 4 g packet Take 1 packet (4 g total) by mouth 2 (two) times daily. 08/20/17  Yes Georgette Shell, MD  clopidogrel (PLAVIX) 75 MG tablet TAKE 1 TABLET (75 MG TOTAL) BY MOUTH DAILY WITH BREAKFAST. 01/06/17  Yes Turner, Eber Hong, MD  diltiazem (CARDIZEM CD) 120 MG 24 hr capsule Take 1 capsule (120 mg  total) by mouth daily. 08/30/17  Yes Turner, Eber Hong, MD  DULoxetine (CYMBALTA) 30 MG capsule Take 30 mg by mouth daily.    Yes [provider]  ELIQUIS STARTER PACK (ELIQUIS STARTER PACK) 5 MG TABS .Take two 5 mg tabs twice daily for 6 days then on day 7 switch to one 5 mg tablet twice daily. 08/20/17  Yes Georgette Shell, MD  ferrous sulfate 325 (65 FE) MG tablet Take 325 mg by mouth daily with breakfast.    Yes [provider]  levETIRAcetam (KEPPRA) 500 MG tablet TAKE 1 TABLET BY MOUTH TWICE A DAY 07/04/17  Yes Ward Givens, NP  levothyroxine (SYNTHROID, LEVOTHROID) 112 MCG tablet Take 112 mcg by mouth daily. 06/25/15  Yes [provider]  loperamide (IMODIUM) 2 MG capsule Take 1 capsule (2 mg total) by mouth every 6 (six) hours as needed for diarrhea or loose stools. 08/20/17  Yes Georgette Shell, MD  nebivolol (BYSTOLIC) 5 MG tablet Take half (1/2) tablet (2.5 mg) by mouth daily.   Yes [provider]  nitroGLYCERIN (NITROSTAT) 0.4 MG SL tablet PLACE 1 TABLET (0.4 MG TOTAL) UNDER THE TONGUE EVERY 5 (FIVE) MINUTES AS NEEDED FOR CHEST PAIN. 02/14/17  Yes Turner, Traci R, MD  pantoprazole (PROTONIX) 40 MG tablet TAKE 1 TABLET (40 MG TOTAL) BY MOUTH 2 (TWO) TIMES DAILY. 04/04/17  Yes Irene Shipper, MD  rosuvastatin (CRESTOR) 20 MG tablet TAKE 1 TABLET (20 MG TOTAL) BY MOUTH EVERY MORNING. Patient taking differently: Take 20 mg by mouth every evening.  01/31/17  Yes Turner, Eber Hong, MD  saccharomyces boulardii (FLORASTOR) 250 MG capsule Take 1 capsule (250 mg total) by mouth 2 (two) times daily. 08/20/17  Yes Georgette Shell, MD     Allergies:   Patient has no known allergies.   Social History   Socioeconomic History  . Marital status: Married    Spouse name: Fritz Pickerel  . Number of children: 0  . Years of education: hs  . Highest education level: Not on file  Occupational History  . Occupation: Retired    Fish farm manager: RETIRED  Social Needs  .  Financial resource strain: Not on file  . Food insecurity:    Worry: Not on file    Inability: Not on file  . Transportation needs:    Medical: Not on file    Non-medical:  Not on file  Tobacco Use  . Smoking status: Former Smoker    Packs/day: 0.30    Years: 3.00    Pack years: 0.90    Types: Cigarettes    Start date: 11/08/1964    Last attempt to quit: 11/09/1967    Years since quitting: 49.8  . Smokeless tobacco: Never Used  Substance and Sexual Activity  . Alcohol use: No    Alcohol/week: 0.0 standard drinks  . Drug use: No  . Sexual activity: Not on file  Lifestyle  . Physical activity:    Days per week: Not on file    Minutes per session: Not on file  . Stress: Not on file  Relationships  . Social connections:    Talks on phone: Not on file    Gets together: Not on file    Attends religious service: Not on file    Active member of club or organization: Not on file    Attends meetings of clubs or organizations: Not on file    Relationship status: Not on file  Other Topics Concern  . Not on file  Social History Narrative   Lives at home, married   Left-handed   Daily caffeine use: coffee.     Family History:  The patient's family history includes Breast cancer in her sister and unknown relative; CVA in her mother; Coronary artery disease in her father; Diabetes Mellitus I in her father; Emphysema in her father; Heart attack in her mother; Heart disease in her father; Hypertension in her father.   ROS:   Please see the history of present illness.    ROS All other systems reviewed and are negative.   PHYSICAL EXAM:   VS:  BP (!) 164/90   Pulse (!) 106   Ht _0  (1.499 m)   Wt 140 lb 6.4 oz (63.7 kg)   BMI 28.36 kg/m    GEN: Well nourished, well developed, in no acute distress  HEENT: normal  Neck: no JVD, carotid bruits, or masses Cardiac:RRR; no murmurs, rubs, or gallops,no edema  Respiratory:  clear to auscultation bilaterally, normal work of  breathing GI: soft, nontender, nondistended, + BS MS: no deformity or atrophy  Skin: warm and dry, no rash Neuro:  Alert and Oriented x 3, Strength and sensation are intact Psych: euthymic mood, full affect  Wt Readings from Last 3 Encounters:  09/01/17 140 lb 6.4 oz (63.7 kg)  08/20/17 149 lb 0.5 oz (67.6 kg)  08/18/17 142 lb (64.4 kg)      Studies/Labs Reviewed:   EKG:  EKG is not ordered today.   Recent Labs: 08/18/2017: ALT 9; TSH 1.063 08/20/2017: BUN <5; Creatinine, Ser 0.99; Hemoglobin 11.2; Magnesium 2.3; Platelets 293; Potassium 3.9; Sodium 143   Lipid Panel    Component Value Date/Time   CHOL 132 11/23/2016 1109   TRIG 56 11/23/2016 1109   HDL 70 11/23/2016 1109   CHOLHDL 1.9 11/23/2016 1109   CHOLHDL 1.8 06/06/2015 0928   VLDL 11 06/06/2015 0928   LDLCALC 51 11/23/2016 1109    Additional studies/ records that were reviewed today include:   Echocardiogram: 03/15/13 Study Conclusions  - Left ventricle: The cavity size was normal. There was mild focal   basal hypertrophy of the septum. Systolic function was normal.   The estimated ejection fraction was in the range of 60% to 65%.   Wall motion was normal; there were no regional wall motion   abnormalities. Doppler parameters are consistent with abnormal  left ventricular relaxation (grade 1 diastolic dysfunction).   Doppler parameters are consistent with indeterminate ventricular   filling pressure. - Aortic valve: Valve mobility was restricted. There was very mild   stenosis. There was no regurgitation. - Mitral valve: Moderately calcified annulus. Transvalvular   velocity was within the normal range. There was no evidence for   stenosis. There was trivial regurgitation. - Left atrium: The atrium was severely dilated. - Right ventricle: The cavity size was normal. Wall thickness was   normal. Systolic function was normal. - Atrial septum: No defect or patent foramen ovale was identified   by color flow  Doppler. - Pulmonary arteries: Systolic pressure was within the normal   range. PA peak pressure: 25 mm Hg (S). - Pericardium, extracardiac: A trivial pericardial effusion was   identified.  Cardiac Catheterization: 04/03/12 PCI NARRATIVE: The case was discussed with Dr. Radford Pax and Dr. Tamala Julian.  Initially, elective intervention was planned for 04/06/12, but the patient returned with unstable angina symptoms.  It was decided to proceed with angioplasty of the mid LAD given her symptoms and recent apical ischemia noted on stress test.  The patient was enrolled in the Regulate-PCI trial.  She was anticoagulated and an ACT was used to check that the Angiomax was therapeutic.  A CLS 3.0 Guide catheter was used to engage the left main.  A prowater wire was placed in the LAD across the area of disease in the mid LAD which was 75% in severity in some views.  A 2.5 x 12 Sprinter balloon was used to predilated.  There was significant wire bias in the proximal vessel due to tortuosity.  Several doses of IC NTG were used.  A 3.0 x 12 Promus stent was deployed at [employer].  The stent was posdilated with a 3.0 x 9 [place] balloon to 10 Atm.  No residual stenosis.  Pseudolesions resolved when the wire was removed.  TIMI 3 flow was maintained throughout.  IMPRESSIONS:  1. Successful PCI of the mid left anterior descending artery with a 2.75 x 12 Promus stent, postdilated to > 3.0 mm in diameter.   RECOMMENDATION:  Dual antiplatelet therapy for at least a year.  Aggressive secondary prevention.  The patient will be watched overnight.  Followup with Dr. Radford Pax.   Cath 03/14/12 PROCEDURE:  Left heart catheterization with selective coronary angiography, left ventriculogram.  INDICATIONS:  CAD with recurrent chest pain and abnormal stress test 00. The risks, benefits, and details of the procedure were explained to the patient.  The patient verbalized understanding and wanted to proceed.  Informed written consent was  obtained.  PROCEDURE TECHNIQUE:  After Xylocaine anesthesia a 85F sheath was placed in the right femoral artery with a single anterior needle wall stick.   Left coronary angiography was done using a Judkins L4 guide catheter.  Right coronary angiography was done using a Judkins R4 guide catheter.  Left ventriculography was done using a pigtail catheter.    CONTRAST:  Total of 70cc.  COMPLICATIONS:  None.    HEMODYNAMICS:  Aortic pressure was 164/44mHg; LV pressure was 159/142mg; LVEDP 1671m.  There was no gradient between the left ventricle and aorta.    ANGIOGRAPHIC DATA:   The left main coronary artery is widely patent and trifurcates into an LAD, ramus  and left circumflex arteries.  The left anterior descending artery is widely patent in the proximal portion with 50% stenosis.  It then gives rise to a moderate sized first diagonal which is patent.  At the takeoff of the first diagonal there is a 70% stenosis and then at the takeoff of the second diagonal there is a 50-70% stenosis of the LAD.  The LAD is very tortuous.   The left circumflex artery has an ostial 70-80% stenosis.  The ongoing left circumflex is small in size and gives rise to a small OM1 which is patent and then a second small OM2 which is patent.  The ramus is large and widely patent and bifurcates into 2 daughter vessels which are patent.    The right coronary artery is calcified and widely patent in its proximal and mid portions with luminal irregularities.  There is a long stent in the distal RCA with 50% instent restenosis in the mid to distal portion of the stent.  The RCA then gives off a PDA which has a 50-70% ostial stenosis.  LEFT VENTRICULOGRAM:  Left ventricular angiogram was done in the 30 RAO projection and revealed normal left ventricular wall motion and systolic function with an estimated ejection fraction of 65%.  LVEDP was 16 mmHg.  IMPRESSIONS:  1. Normal left main coronary artery. 2. 50%  proximal and 70% mid stenosis of the LAD and distally at takeoff of second diagonal there is a 50-70% stenosis in a very tortuous LAD 3.  70-80% ostial stenosis of the left circumflex in a small vessel. 4.  50% instent restenosis of the mid to distal portion of the RCA stent and 50-70% stenosis of the ostial PDA 3. Normal left ventricular systolic function.  LVEDP 16 mmHg.  Ejection fraction 60%.  RECOMMENDATION:   1.  Films reviewed with Dr. Tamala Julian.  She has significant disease in the ostial left circ but the vessel is small.  There has been progression of disease in the LAD system since last cath but the LAD is a tortuous vessel with disease throughout.  There is borderline disease in the PAD as well.  At this time recommend aggressive antianginal therapy and then if she continues to have angina, Dr. Tamala Julian will consider PCI of the LAD at the takeoff of the first diagonal. 2.  Increase Imdur to 30m daily 3.  Continue ASA/Plavix/beta blocker and ARB 4.  Followup with me in the office in 2 weeks  ASSESSMENT & PLAN:    1. CAD s/p inf-post MI 2011 s/p DES to RCA. Recurrent CP s/p DES to RCA 06/2010 and then DES to LAD 2014 with residual dz treated medically.  - Hx of chronic noncardiac chest pain related to esophageal spasm and esophageal stricture. Low risk stress test 01/2016. - denies any recurrent chest pain. On DAPT with ASA and plavix. Given bleeding gums will discontinue plavix (Last PCI in 2014 - reviewed with DOD Dr. NJohnsie Cancel. Continue Crestor and Imdur.   2. Hypertension with orthostatic hypotension - BP of 164/90 with HR of 106 (regular on exam). She will start Bystolic 21.9FXand Cardizem CD [id number] as recommended by Dr. TuRadford PaxShe will call usKoreaith how she is feeling. Continue to hold amlodipine and doxazosin. She may take lasix 205mRN of LE edema.   3. Bleeding gums - Stop Plavix as above. Continue ASA 82m71m and Eliquis 5mg 68m.   4. DVT - Continue Eliquis. Per PCP  5.  HLD - 11/23/2016: Cholesterol, Total 132; HDL 70; LDL Calculated 51; Triglycerides 56  - Continue Crestor 20mg 20m   Medication Adjustments/Labs and Tests Ordered: Current medicines are reviewed at length with the patient today.  Concerns  regarding medicines are outlined above.  Medication changes, Labs and Tests ordered today are listed in the Patient Instructions below. Patient Instructions  Medication Instructions:  Your physician has recommended you make the following change in your medication:  1.  STOP the Plavix  Labwork: None ordered  Testing/Procedures: None ordered  Follow-Up: Your physician recommends that you schedule a follow-up appointment in: WITH DR. Radford Pax AS PLANNED   Any Other Special Instructions Will Be Listed Below (If Applicable).     If you need a refill on your cardiac medications before your next appointment, please call your pharmacy.      Jarrett Soho, [place]  09/01/2017 1:00 PM    [address], [place], [place]  [id number] Phone: [phone]; Fax: [phone]

## 2017-09-02 DIAGNOSIS — D631 Anemia in chronic kidney disease: Secondary | ICD-10-CM | POA: Diagnosis not present

## 2017-09-02 DIAGNOSIS — E876 Hypokalemia: Secondary | ICD-10-CM | POA: Diagnosis not present

## 2017-09-02 DIAGNOSIS — N183 Chronic kidney disease, stage 3 (moderate): Secondary | ICD-10-CM | POA: Diagnosis not present

## 2017-09-02 DIAGNOSIS — E889 Metabolic disorder, unspecified: Secondary | ICD-10-CM | POA: Diagnosis not present

## 2017-09-02 DIAGNOSIS — I129 Hypertensive chronic kidney disease with stage 1 through stage 4 chronic kidney disease, or unspecified chronic kidney disease: Secondary | ICD-10-CM | POA: Diagnosis not present

## 2017-09-02 DIAGNOSIS — N200 Calculus of kidney: Secondary | ICD-10-CM | POA: Diagnosis not present

## 2017-09-02 DIAGNOSIS — M9080 Osteopathy in diseases classified elsewhere, unspecified site: Secondary | ICD-10-CM | POA: Diagnosis not present

## 2017-09-02 DIAGNOSIS — E559 Vitamin D deficiency, unspecified: Secondary | ICD-10-CM | POA: Diagnosis not present

## 2017-09-13 ENCOUNTER — Encounter: Payer: Self-pay | Admitting: Internal Medicine

## 2017-09-13 ENCOUNTER — Ambulatory Visit: Payer: PPO | Admitting: Internal Medicine

## 2017-09-13 VITALS — BP 140/84 | HR 80 | Ht 59.0 in | Wt 141.1 lb

## 2017-09-13 DIAGNOSIS — K219 Gastro-esophageal reflux disease without esophagitis: Secondary | ICD-10-CM | POA: Diagnosis not present

## 2017-09-13 DIAGNOSIS — K589 Irritable bowel syndrome without diarrhea: Secondary | ICD-10-CM

## 2017-09-13 DIAGNOSIS — R197 Diarrhea, unspecified: Secondary | ICD-10-CM

## 2017-09-13 NOTE — Progress Notes (Signed)
HISTORY OF PRESENT ILLNESS:  [name] is a 72 y.o. female with multiple significant medical problems who presents today after being hospitalized August 8 through 08/20/2017 for dizziness,, weakness, and diarrhea. He'll to be dehydrated. Hospitalization reviewed. Testing for Clostridium difficile negative. Placed on Questran twice daily but currently taking once daily. Not taken with regard to other medications. A number of antihypertensives discontinued. Currently reports resolution of diarrhea. She may go occasional day without a bowel movement. Stools are more ball-like. Other GI complaints are stable. Review of blood work from August 2019 shows hemoglobin 11.2. She is on Eliquis. She is accompanied today by her husband  REVIEW OF SYSTEMS:  All non-GI ROS negative except for anxiety, back pain, depression, hearing problems, heart rhythm change, sleeping problems, echo swelling, shortness of breath  Past Medical History:  Diagnosis Date  . Anxiety   . Arthritis   . Carpal tunnel syndrome, bilateral 07/31/2015  . Cataract   . Cervical spondylosis without myelopathy   . Chronic chest pain   . Chronic kidney disease   . Chronic low back pain   . Chronotropic incompetence   . CKD (chronic kidney disease), stage III ([place])   . Coronary artery disease    a. inf-post MI 2011 s/p DES to RCA. b. DES to RCA 06/2010. c. DES to LAD 2014, residual dz treated medically.  . Depression   . Esophageal stricture   . GERD (gastroesophageal reflux disease)   . Gross hematuria   . History of esophageal dilatation    FOR STRIUCTURE  . History of gout   . History of hiatal hernia   . History of kidney stones   . Hyperlipidemia   . Hypertension   . Hypothyroidism   . Internal hemorrhoids   . Irritable bowel syndrome   . Mild aortic stenosis    by echo 2018  . Obesity   . Orthostatic hypotension   . Partial seizure disorder (HCC) NEUROLOGIST-- DR WILLIS   NOCTURNAL  . PONV (postoperative nausea  and vomiting)    hard time getting iv site-had to do neck stick 2 yrs ago  . Pulmonary fibrosis ([place])   . S/P pericardial cyst excision    02-05-2013  benign  . Seizures ([place])    none in yrs  . SVT (supraventricular tachycardia) ([place])   . Trigger finger, acquired 09/30/2015   Right middle finger  . Vitamin D deficiency     Past Surgical History:  Procedure Laterality Date  . BIOPSY OF MEDIASTINAL MASS N/A 02/05/2013   Procedure: RESECTION OF MEDIASTINAL MASS;  Surgeon: Ivin Poot, MD;  Location: [place];  Service: Thoracic;  Laterality: N/A;  . CARDIAC CATHETERIZATION Right 01/24/2015   Procedure: right femoral ARTERIAL LINE INSERTION;  Surgeon: Ivin Poot, MD;  Location: [place];  Service: Thoracic;  Laterality: Right;  . CARDIOVASCULAR STRESS TEST  11-22-2013  dr Irish Lack   normal lexiscan study/  no ischemia/  normal LVF and wall motion/ ef 81%  . COLONOSCOPY  last one 2014  . CORONARY ANGIOGRAM  03/10/2011   Procedure: CORONARY ANGIOGRAM;  Surgeon: Jettie Booze, MD;  Location: [facility] CATH LAB;  Service: Cardiovascular;;  . CORONARY ANGIOPLASTY WITH STENT PLACEMENT  11-22-2009  dr Irish Lack   Acute inferoposterior MI/  ef 60%,  PCI with DES x1 to dRCA,  25%  mLAD  . CORONARY ANGIOPLASTY WITH STENT PLACEMENT  06-26-2010  dr Daneen Schick   Cutting balloon angioplasty to [facility] with DES x1,  40% mLAD (non-obstructive CAD)  . CYSTOSCOPY WITH RETROGRADE PYELOGRAM, URETEROSCOPY AND STENT PLACEMENT Right 02/13/2014   Procedure: CYSTOSCOPY WITH RETROGRADE PYELOGRAM, URETEROSCOPY AND STENT PLACEMENT;  Surgeon: Bernestine Amass, MD;  Location: [facility];  Service: Urology;  Laterality: Right;  . ECTOPIC PREGNANCY SURGERY  YRS AGO   SALPINGECTOMY  . ESOPHAGOGASTRODUODENOSCOPY N/A 02/15/2014   Procedure: ESOPHAGOGASTRODUODENOSCOPY (EGD);  Surgeon: Jerene Bears, MD;  Location: [facility];  Service: Endoscopy;  Laterality: N/A;  . ESOPHAGOGASTRODUODENOSCOPY (EGD) WITH ESOPHAGEAL  DILATION  05-20-2011  . HOLMIUM LASER APPLICATION Right 05/14/84   Procedure: HOLMIUM LASER APPLICATION;  Surgeon: Bernestine Amass, MD;  Location: [facility];  Service: Urology;  Laterality: Right;  . LEFT HEART CATHETERIZATION WITH CORONARY ANGIOGRAM N/A 03/14/2012   Procedure: LEFT HEART CATHETERIZATION WITH CORONARY ANGIOGRAM;  Surgeon: Sueanne Margarita, MD;  Location: [place] CATH LAB;  Service: Cardiovascular;  Laterality: N/A;  Normal LM,  50% pLAD,  70% mLAD,  D2 50-70%, very tortuous LAD,  70-82% ostial LCFX,  50% in-stent restenosis of  dRCA and mRCA  stent and 50-70% ostial PDA,  normal LVSF, ef 60%  . LUNG BIOPSY Right 01/24/2015   Procedure: RIGHT LUNG BIOPSY;  Surgeon: Ivin Poot, MD;  Location: [place];  Service: Thoracic;  Laterality: Right;  . MEDIASTERNOTOMY N/A 02/05/2013   Procedure: MEDIAN STERNOTOMY;  Surgeon: Ivin Poot, MD;  Location: [place];  Service: Thoracic;  Laterality: N/A;  . MEDIASTERNOTOMY Left 02/05/2013  . NEEDLE GUIDED EXCISION BREAST CALCIFICATIONS Right 08-09-2008  . PERCUTANEOUS CORONARY STENT INTERVENTION (PCI-S) N/A 04/03/2012   Procedure: PERCUTANEOUS CORONARY STENT INTERVENTION (PCI-S);  Surgeon: Jettie Booze, MD;  Location: [facility] CATH LAB;  Service: Cardiovascular;  Laterality: N/A;   Successful PCI  mLAD with 2.75x12 Promus stent, postdilated to >0.54mm  . THORACOTOMY Right 01/24/2015   Procedure: RIGHT MINI/LIMITED THORACOTOMY;  Surgeon: Ivin Poot, MD;  Location: [place];  Service: Thoracic;  Laterality: Right;  . TRANSTHORACIC ECHOCARDIOGRAM  11-17-2011   grade I diastolic dysfunction/  ef 55-60%  . VIDEO BRONCHOSCOPY N/A 01/24/2015   Procedure: RIGHT VIDEO BRONCHOSCOPY;  Surgeon: Ivin Poot, MD;  Location: [facility] OR;  Service: Thoracic;  Laterality: N/A;    Social History Debroh AZADEH HYDER  reports that she quit smoking about 49 years ago. Her smoking use included cigarettes. She started smoking about 52 years ago. She has a 0.90  pack-year smoking history. She has never used smokeless tobacco. She reports that she does not drink alcohol or use drugs.  family history includes Breast cancer in her sister and unknown relative; CVA in her mother; Coronary artery disease in her father; Diabetes Mellitus I in her father; Emphysema in her father; Heart attack in her mother; Heart disease in her father; Hypertension in her father.  No Known Allergies     PHYSICAL EXAMINATION: Vital signs: BP 140/84 (BP Location: Left Arm, Patient Position: Sitting, Cuff Size: Normal)   Pulse 80   Ht 4\' 11"  (1.499 m)   Wt 141 lb 2 oz (64 kg)   BMI 28.50 kg/m   Constitutional: generally well-appearing, no acute distress Psychiatric: alert and oriented x3, cooperative Eyes: extraocular movements intact, anicteric, conjunctiva pink Mouth: oral pharynx moist, no lesions Neck: supple no lymphadenopathy Cardiovascular: heart regular rate and rhythm, 2/6 systolic murmur right upper sternal border Lungs: clear to auscultation bilaterally Abdomen: soft, nontender, nondistended, no obvious ascites, no peritoneal signs, normal bowel sounds, no organomegaly Rectal:mitted Extremities: no  clubbing, cyanosis, or lower extremity edema bilaterally Skin: no lesions on visible extremities Neuro: No focal deficits. Cranial nerves intact  ASSESSMENT:  #1. Recent problems with diarrhea and dehydration likely infectious #2. History of IBS #3. History of GERD and dysphagia secondary to dysmotility #4. Screening colonoscopy up-to-date   PLAN:  #1. Advised to adjust Questran to achieve desired result. May try every other day. Also told not to take within 2 hours of other medications #2. Reflux precautions #3. Continue PPI #4. Routine GI follow-up one year #5. He screening colonoscopy around December 2021  15 minutes spent face-to-face with the patient. Greater than 50% a time use for counseling regarding recent problems with diarrhea and current  management recommendations

## 2017-09-14 DIAGNOSIS — Z23 Encounter for immunization: Secondary | ICD-10-CM | POA: Diagnosis not present

## 2017-09-14 DIAGNOSIS — F324 Major depressive disorder, single episode, in partial remission: Secondary | ICD-10-CM | POA: Diagnosis not present

## 2017-09-14 DIAGNOSIS — R197 Diarrhea, unspecified: Secondary | ICD-10-CM | POA: Diagnosis not present

## 2017-09-14 DIAGNOSIS — K219 Gastro-esophageal reflux disease without esophagitis: Secondary | ICD-10-CM | POA: Diagnosis not present

## 2017-09-14 DIAGNOSIS — I82492 Acute embolism and thrombosis of other specified deep vein of left lower extremity: Secondary | ICD-10-CM | POA: Diagnosis not present

## 2017-09-14 DIAGNOSIS — I10 Essential (primary) hypertension: Secondary | ICD-10-CM | POA: Diagnosis not present

## 2017-09-14 DIAGNOSIS — I2510 Atherosclerotic heart disease of native coronary artery without angina pectoris: Secondary | ICD-10-CM | POA: Diagnosis not present

## 2017-09-14 DIAGNOSIS — E039 Hypothyroidism, unspecified: Secondary | ICD-10-CM | POA: Diagnosis not present

## 2017-09-14 DIAGNOSIS — Z0000 Encounter for general adult medical examination without abnormal findings: Secondary | ICD-10-CM | POA: Diagnosis not present

## 2017-09-14 DIAGNOSIS — N183 Chronic kidney disease, stage 3 (moderate): Secondary | ICD-10-CM | POA: Diagnosis not present

## 2017-09-14 DIAGNOSIS — E7800 Pure hypercholesterolemia, unspecified: Secondary | ICD-10-CM | POA: Diagnosis not present

## 2017-09-15 ENCOUNTER — Telehealth: Payer: Self-pay | Admitting: Cardiology

## 2017-09-15 NOTE — Telephone Encounter (Signed)
New Message:      [name] is calling concerning her Eliquis. Pt is applying for assistance with Eliquis. His question is , what can she do until she gets the Eliquis. She can not afford it, Does she need to go back on Plavix until she can get the Eliquis or can she get samples until  She gets the Eliquis

## 2017-09-15 NOTE — Telephone Encounter (Signed)
Spoke to patient's husband in regards to the patient's Eliquis prescription.  He was requesting samples until they get financial assistance (awaiting paperwork). Unfortunately, we do not have samples today.  I told him that we should have some available early next week and that he should call prior to pick up.

## 2017-09-19 ENCOUNTER — Telehealth: Payer: Self-pay | Admitting: Cardiology

## 2017-09-19 NOTE — Telephone Encounter (Signed)
Samples had already been placed on 9/5, a 4 week supply, returned the other samples back.

## 2017-09-19 NOTE — Telephone Encounter (Signed)
Follow Up:   Patient husband calling back concerning his wide samples.

## 2017-09-19 NOTE — Telephone Encounter (Signed)
Spoke with Fritz Pickerel, the patient's husband, per dpr. The patient is running out of Eliqius 5mg , BID, tomorrow. They are filling out paperwork to reduce the cost of the medication. 3 weeks worth of samples of Eliqius are provided to get the patient through the next few weeks while the authorization is complete. The patient will come to the office to drop off their paperwork and pick up the samples tomorrow. The patient's husband expressed understanding and had no further questions.

## 2017-09-20 ENCOUNTER — Telehealth: Payer: Self-pay

## 2017-09-20 ENCOUNTER — Other Ambulatory Visit: Payer: Self-pay | Admitting: Family Medicine

## 2017-09-20 ENCOUNTER — Ambulatory Visit
Admission: RE | Admit: 2017-09-20 | Discharge: 2017-09-20 | Disposition: A | Discharge status: Home / Self Care | Payer: PPO | Source: Ambulatory Visit | Attending: Family Medicine | Admitting: Family Medicine

## 2017-09-20 DIAGNOSIS — S0990XA Unspecified injury of head, initial encounter: Secondary | ICD-10-CM | POA: Diagnosis not present

## 2017-09-20 DIAGNOSIS — S0003XA Contusion of scalp, initial encounter: Secondary | ICD-10-CM

## 2017-09-20 DIAGNOSIS — W19XXXA Unspecified fall, initial encounter: Secondary | ICD-10-CM | POA: Diagnosis not present

## 2017-09-20 DIAGNOSIS — Y92009 Unspecified place in unspecified non-institutional (private) residence as the place of occurrence of the external cause: Secondary | ICD-10-CM | POA: Diagnosis not present

## 2017-09-20 DIAGNOSIS — R51 Headache: Secondary | ICD-10-CM | POA: Diagnosis not present

## 2017-09-20 DIAGNOSIS — S7001XA Contusion of right hip, initial encounter: Secondary | ICD-10-CM | POA: Diagnosis not present

## 2017-09-20 NOTE — Telephone Encounter (Signed)
**Note De-Identified  Obfuscation** The pt brought the provider part of a [employer] pt assistance application for Eliquis to the office today. I have completed the application and Dr Radford Pax has signed it. I have advised the pt that it is ready to be picked up. She states that they will pick it up tomorrow from the front office.

## 2017-09-27 ENCOUNTER — Telehealth: Payer: Self-pay | Admitting: Cardiology

## 2017-09-27 IMAGING — CR DG CHEST 2V
2 series · 2 of 2 positions shown · non-contrast
Comparison: 12/04/2014

CLINICAL DATA: Procedure Code VIDEO ASSISTED THORACOSCOPY (Right
Chest) LUNG BIOPSY (N/A ) VIDEO BRONCHOSCOPY (N/A ) Diagnosis Codes
PULMONARY FIBROSIS Hypertension; Heart Murmur; GERD; Angina; non
smoker

EXAM:
CHEST  2 VIEW

[w chest pa]
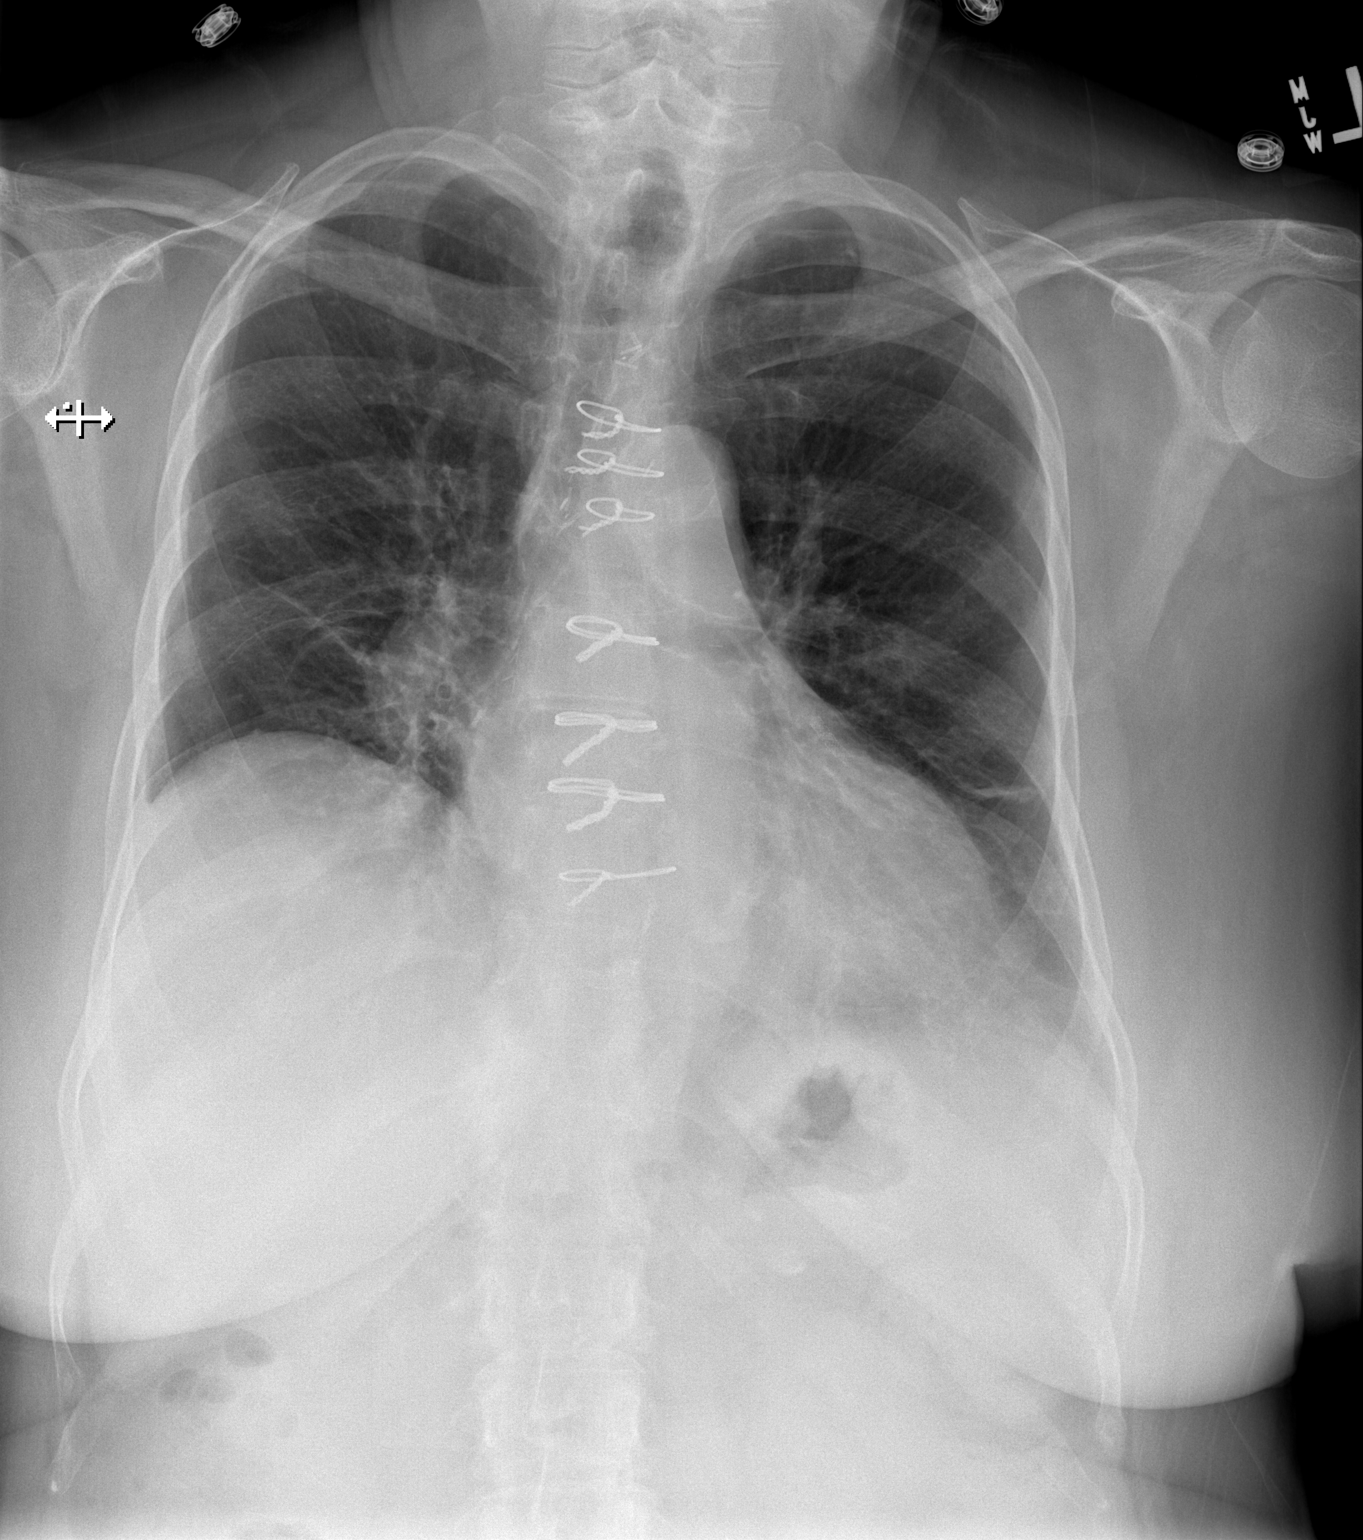

[w chest lat]
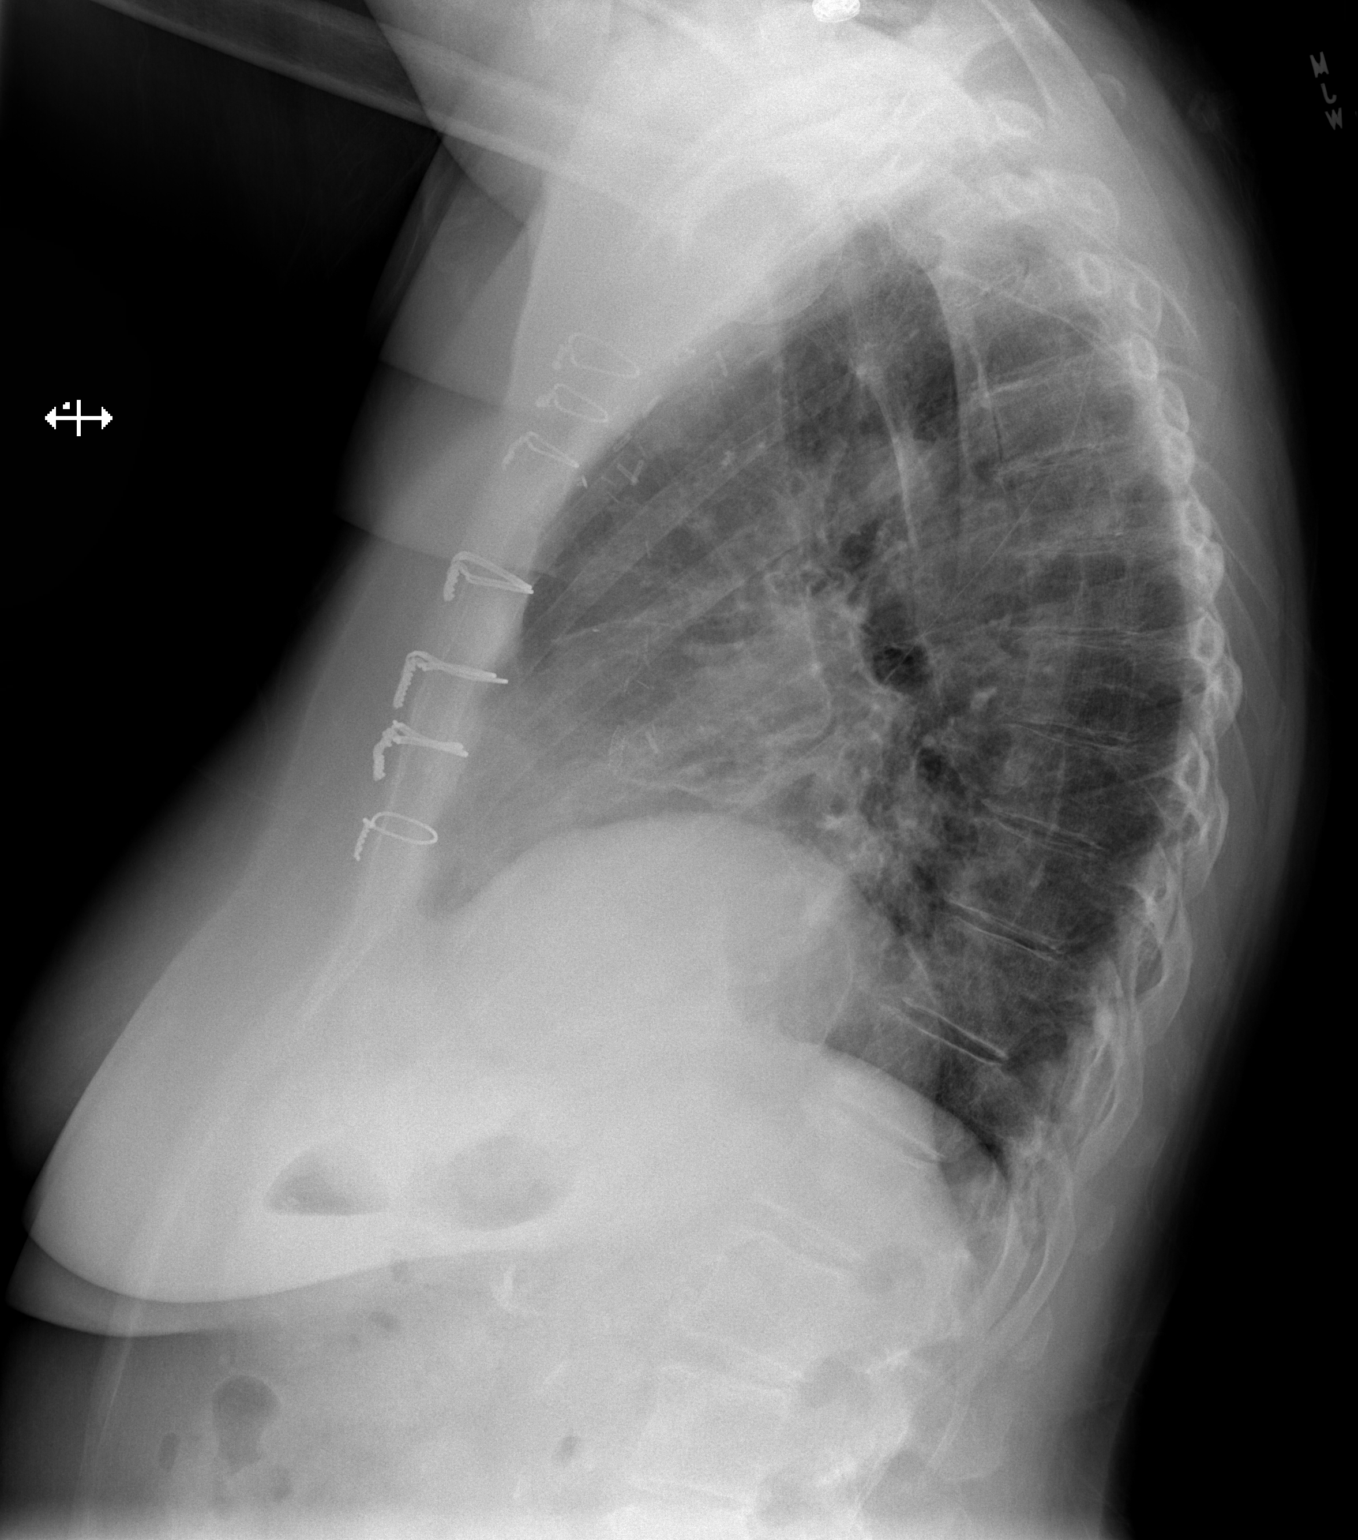

[2 of 2 positions shown; findings below may reference images not displayed]

FINDINGS: Status post median sternotomy. Heart is enlarged. Coronary stent or
stents visible. Perihilar peribronchial thickening noted. Elevated
right hemidiaphragm is stable. Subsegmental atelectasis or scarring
identified at both lung bases, stable in appearance. There are no
new consolidations or pleural effusions. No pulmonary edema.
IMPRESSION: 1. Stable bronchitic changes.
2.  No evidence for acute cardiopulmonary abnormality.

## 2017-09-27 NOTE — Telephone Encounter (Signed)
Walk in pt Form-[employer] paperwork dropped off. Gave to [employer]

## 2017-09-27 NOTE — Telephone Encounter (Addendum)
**Note De-Identified  Obfuscation** The pt dropped another provider part of a [employer] pt asst application off at the office today with a note requesting that we fill out the new provider part as the last one was lost in the mail.  I have completed the form and placed it in Dr Landis Gandy mail bin awaiting her signature.  The pt request that we call her when the form is ready to be picked up.

## 2017-09-28 IMAGING — CR DG CHEST 1V PORT
1 series · 1 of 1 positions shown · non-contrast
Comparison: 01/24/2015

CLINICAL DATA: Status post thoracotomy.  Follow-up study.

EXAM:
PORTABLE CHEST 1 VIEW

[AP]
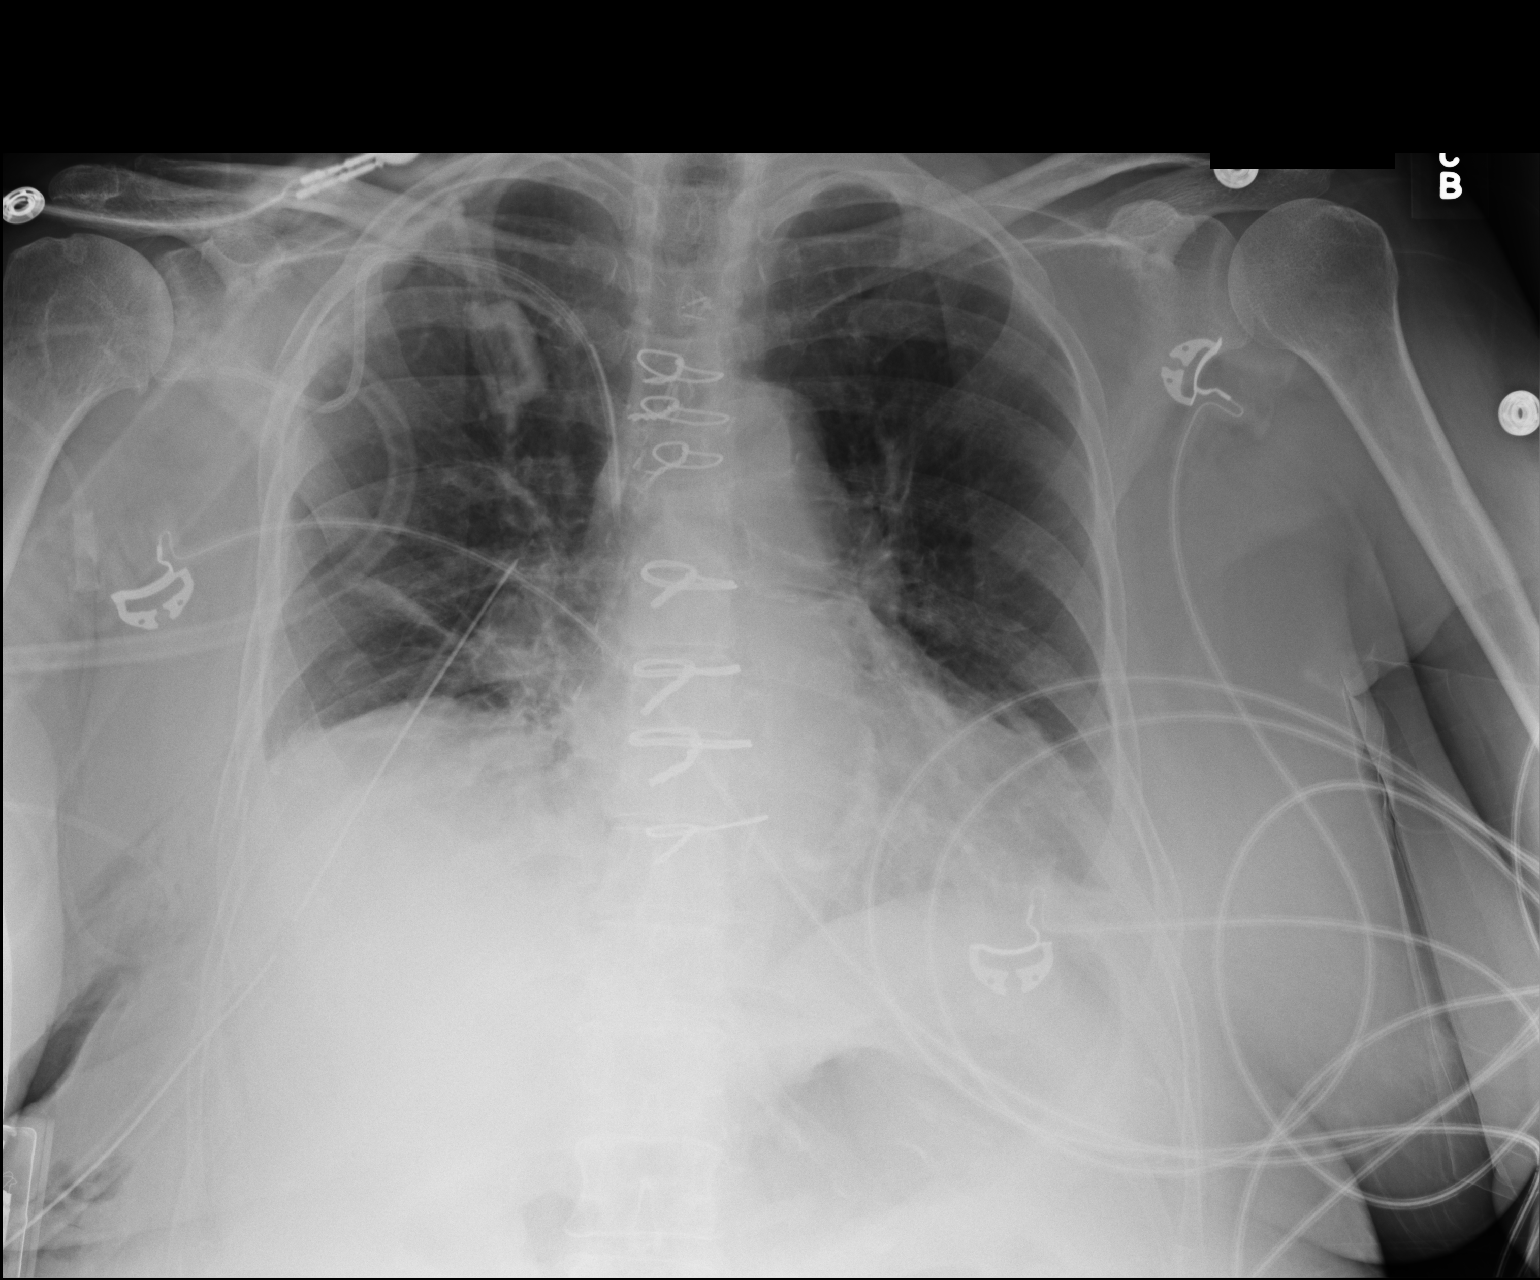

[1 of 1 positions shown; findings below may reference images not displayed]

FINDINGS: Right chest tube and right subclavian central venous line are
stable.

Lung base atelectasis and atelectasis extending the right upper lobe
from the right hilum has improved. There are no new lung opacities.

No pneumothorax.  No convincing pleural effusion.

Cardiac silhouette is normal in size.
IMPRESSION: 1. Status post right thoracotomy.
2. Lung aeration has improved since the prior study consistent with
improved atelectasis. No new lung abnormalities.
3. No pneumothorax.

## 2017-09-29 ENCOUNTER — Encounter: Payer: Self-pay | Admitting: Adult Health

## 2017-09-29 ENCOUNTER — Ambulatory Visit: Payer: PPO | Admitting: Adult Health

## 2017-09-29 VITALS — BP 135/81 | HR 73 | Ht 59.0 in | Wt 141.0 lb

## 2017-09-29 DIAGNOSIS — R569 Unspecified convulsions: Secondary | ICD-10-CM

## 2017-09-29 NOTE — Patient Instructions (Addendum)
Your Plan:  Continue Keppra  If your symptoms worsen or you develop new symptoms please let us know.    Thank you for coming to see us at [facility]. I hope we have been able to provide you high quality care today.  You may receive a patient satisfaction survey over the next few weeks. We would appreciate your feedback and comments so that we may continue to improve ourselves and the health of our patients.  

## 2017-09-29 NOTE — Progress Notes (Signed)
PATIENT: [name] DOB: 1945/03/22  REASON FOR VISIT: follow up HISTORY FROM: patient  HISTORY OF PRESENT ILLNESS: Today 09/29/17: [name] is a 72 year old female with a history of seizures.  She returns today for follow-up.  She remains on Keppra 500 mg twice a day.  She denies any seizure events.  Denies any changes in her mood or behavior.  No change in her gait or balance.  She reports that she recently had a fall.  She states that she tripped when going to the bathroom and hit the bathroom tub.  She has bruising on her right thigh and below the right eye.  She returns today for evaluation.  HISTORY [name] is a 72 year old left-handed white female with a history of seizures that have been under good control. The patient has not had any recurrence of the seizure since last seen. She has mild bilateral carpal tunnel syndrome but she has not wished to pursue any surgical therapy for this. The patient has recently had a transient slight worsening in renal function, she has chronic renal insufficiency. Her most recent estimated GFR was 41. The patient returns to this office for an evaluation. She is tolerating the Keppra well  REVIEW OF SYSTEMS: Out of a complete 14 system review of symptoms, the patient complains only of the following symptoms, and all other reviewed systems are negative.  See HPI  ALLERGIES: No Known Allergies  HOME MEDICATIONS: Outpatient Medications Prior to Visit  Medication Sig Dispense Refill  . ALPRAZolam (XANAX) 0.25 MG tablet Take 0.25 mg by mouth 3 (three) times daily.     Marland Kitchen aspirin EC 81 MG tablet Take 81 mg by mouth daily.    . calcitRIOL (ROCALTROL) 0.25 MCG capsule Take 0.25 mcg by mouth daily.     . cholestyramine (QUESTRAN) 4 g packet Take 1 packet (4 g total) by mouth 2 (two) times daily. 60 each 12  . diltiazem (CARDIZEM CD) 120 MG 24 hr capsule Take 1 capsule (120 mg total) by mouth daily. 90 capsule 3  . DULoxetine (CYMBALTA) 30 MG  capsule Take 30 mg by mouth daily.     Marland Kitchen ELIQUIS STARTER PACK (ELIQUIS STARTER PACK) 5 MG TABS .Take two 5 mg tabs twice daily for 6 days then on day 7 switch to one 5 mg tablet twice daily. 1 each 0  . ferrous sulfate 325 (65 FE) MG tablet Take 325 mg by mouth daily with breakfast.     . levETIRAcetam (KEPPRA) 500 MG tablet TAKE 1 TABLET BY MOUTH TWICE A DAY 180 tablet 4  . levothyroxine (SYNTHROID, LEVOTHROID) 112 MCG tablet Take 112 mcg by mouth daily.  5  . loperamide (IMODIUM) 2 MG capsule Take 1 capsule (2 mg total) by mouth every 6 (six) hours as needed for diarrhea or loose stools. 30 capsule 0  . magnesium oxide (MAG-OX) 400 MG tablet Take 1 tablet by mouth 2 (two) times daily.    . nebivolol (BYSTOLIC) 5 MG tablet Take half (1/2) tablet (2.5 mg) by mouth daily.    . nitroGLYCERIN (NITROSTAT) 0.4 MG SL tablet PLACE 1 TABLET (0.4 MG TOTAL) UNDER THE TONGUE EVERY 5 (FIVE) MINUTES AS NEEDED FOR CHEST PAIN. 75 tablet 1  . pantoprazole (PROTONIX) 40 MG tablet TAKE 1 TABLET (40 MG TOTAL) BY MOUTH 2 (TWO) TIMES DAILY. 60 tablet 10  . potassium chloride SA (K-DUR,KLOR-CON) 20 MEQ tablet Take 1 tablet by mouth daily.    . rosuvastatin (CRESTOR) 20 MG  tablet TAKE 1 TABLET (20 MG TOTAL) BY MOUTH EVERY MORNING. (Patient taking differently: Take 20 mg by mouth every evening. ) 90 tablet 2  . saccharomyces boulardii (FLORASTOR) 250 MG capsule Take 1 capsule (250 mg total) by mouth 2 (two) times daily. 60 capsule 0   No facility-administered medications prior to visit.     PAST MEDICAL HISTORY: Past Medical History:  Diagnosis Date  . Anxiety   . Arthritis   . Carpal tunnel syndrome, bilateral 07/31/2015  . Cataract   . Cervical spondylosis without myelopathy   . Chronic chest pain   . Chronic kidney disease   . Chronic low back pain   . Chronotropic incompetence   . CKD (chronic kidney disease), stage III ([place])   . Coronary artery disease    a. inf-post MI 2011 s/p DES to RCA. b. DES to RCA  06/2010. c. DES to LAD 2014, residual dz treated medically.  . Depression   . Esophageal stricture   . GERD (gastroesophageal reflux disease)   . Gross hematuria   . History of esophageal dilatation    FOR STRIUCTURE  . History of gout   . History of hiatal hernia   . History of kidney stones   . Hyperlipidemia   . Hypertension   . Hypothyroidism   . Internal hemorrhoids   . Irritable bowel syndrome   . Mild aortic stenosis    by echo 2018  . Obesity   . Orthostatic hypotension   . Partial seizure disorder (HCC) NEUROLOGIST-- DR WILLIS   NOCTURNAL  . PONV (postoperative nausea and vomiting)    hard time getting iv site-had to do neck stick 2 yrs ago  . Pulmonary fibrosis ([place])   . S/P pericardial cyst excision    02-05-2013  benign  . Seizures ([place])    none in yrs  . SVT (supraventricular tachycardia) ([place])   . Trigger finger, acquired 09/30/2015   Right middle finger  . Vitamin D deficiency     PAST SURGICAL HISTORY: Past Surgical History:  Procedure Laterality Date  . BIOPSY OF MEDIASTINAL MASS N/A 02/05/2013   Procedure: RESECTION OF MEDIASTINAL MASS;  Surgeon: Ivin Poot, MD;  Location: [place];  Service: Thoracic;  Laterality: N/A;  . CARDIAC CATHETERIZATION Right 01/24/2015   Procedure: right femoral ARTERIAL LINE INSERTION;  Surgeon: Ivin Poot, MD;  Location: [place];  Service: Thoracic;  Laterality: Right;  . CARDIOVASCULAR STRESS TEST  11-22-2013  dr Irish Lack   normal lexiscan study/  no ischemia/  normal LVF and wall motion/ ef 81%  . COLONOSCOPY  last one 2014  . CORONARY ANGIOGRAM  03/10/2011   Procedure: CORONARY ANGIOGRAM;  Surgeon: Jettie Booze, MD;  Location: [facility] CATH LAB;  Service: Cardiovascular;;  . CORONARY ANGIOPLASTY WITH STENT PLACEMENT  11-22-2009  dr Irish Lack   Acute inferoposterior MI/  ef 60%,  PCI with DES x1 to dRCA,  25%  mLAD  . CORONARY ANGIOPLASTY WITH STENT PLACEMENT  06-26-2010  dr Daneen Schick   Cutting balloon angioplasty to  dRCA with DES x1,  40% mLAD (non-obstructive CAD)  . CYSTOSCOPY WITH RETROGRADE PYELOGRAM, URETEROSCOPY AND STENT PLACEMENT Right 02/13/2014   Procedure: CYSTOSCOPY WITH RETROGRADE PYELOGRAM, URETEROSCOPY AND STENT PLACEMENT;  Surgeon: Bernestine Amass, MD;  Location: [facility];  Service: Urology;  Laterality: Right;  . ECTOPIC PREGNANCY SURGERY  YRS AGO   SALPINGECTOMY  . ESOPHAGOGASTRODUODENOSCOPY N/A 02/15/2014   Procedure: ESOPHAGOGASTRODUODENOSCOPY (EGD);  Surgeon: Jerene Bears,  MD;  Location: [facility] ENDOSCOPY;  Service: Endoscopy;  Laterality: N/A;  . ESOPHAGOGASTRODUODENOSCOPY (EGD) WITH ESOPHAGEAL DILATION  05-20-2011  . HOLMIUM LASER APPLICATION Right 08/14/6627   Procedure: HOLMIUM LASER APPLICATION;  Surgeon: Bernestine Amass, MD;  Location: [facility];  Service: Urology;  Laterality: Right;  . LEFT HEART CATHETERIZATION WITH CORONARY ANGIOGRAM N/A 03/14/2012   Procedure: LEFT HEART CATHETERIZATION WITH CORONARY ANGIOGRAM;  Surgeon: Sueanne Margarita, MD;  Location: [place] CATH LAB;  Service: Cardiovascular;  Laterality: N/A;  Normal LM,  50% pLAD,  70% mLAD,  D2 50-70%, very tortuous LAD,  70-82% ostial LCFX,  50% in-stent restenosis of  dRCA and mRCA  stent and 50-70% ostial PDA,  normal LVSF, ef 60%  . LUNG BIOPSY Right 01/24/2015   Procedure: RIGHT LUNG BIOPSY;  Surgeon: Ivin Poot, MD;  Location: [place];  Service: Thoracic;  Laterality: Right;  . MEDIASTERNOTOMY N/A 02/05/2013   Procedure: MEDIAN STERNOTOMY;  Surgeon: Ivin Poot, MD;  Location: [place];  Service: Thoracic;  Laterality: N/A;  . MEDIASTERNOTOMY Left 02/05/2013  . NEEDLE GUIDED EXCISION BREAST CALCIFICATIONS Right 08-09-2008  . PERCUTANEOUS CORONARY STENT INTERVENTION (PCI-S) N/A 04/03/2012   Procedure: PERCUTANEOUS CORONARY STENT INTERVENTION (PCI-S);  Surgeon: Jettie Booze, MD;  Location: [facility] CATH LAB;  Service: Cardiovascular;  Laterality: N/A;   Successful PCI  mLAD with 2.75x12 Promus stent,  postdilated to >0.90mm  . THORACOTOMY Right 01/24/2015   Procedure: RIGHT MINI/LIMITED THORACOTOMY;  Surgeon: Ivin Poot, MD;  Location: [place];  Service: Thoracic;  Laterality: Right;  . TRANSTHORACIC ECHOCARDIOGRAM  11-17-2011   grade I diastolic dysfunction/  ef 55-60%  . VIDEO BRONCHOSCOPY N/A 01/24/2015   Procedure: RIGHT VIDEO BRONCHOSCOPY;  Surgeon: Ivin Poot, MD;  Location: [facility] OR;  Service: Thoracic;  Laterality: N/A;    FAMILY HISTORY: Family History  Problem Relation Age of Onset  . Breast cancer Sister   . Heart disease Father   . Hypertension Father   . Emphysema Father   . Coronary artery disease Father   . Diabetes Mellitus I Father   . Heart attack Mother   . CVA Mother   . Breast cancer Unknown        niece  . Colon cancer Neg Hx   . Stomach cancer Neg Hx   . Esophageal cancer Neg Hx   . Rectal cancer Neg Hx     SOCIAL HISTORY: Social History   Socioeconomic History  . Marital status: Married    Spouse name: Fritz Pickerel  . Number of children: 0  . Years of education: hs  . Highest education level: Not on file  Occupational History  . Occupation: Retired    Fish farm manager: RETIRED  Social Needs  . Financial resource strain: Not on file  . Food insecurity:    Worry: Not on file    Inability: Not on file  . Transportation needs:    Medical: Not on file    Non-medical: Not on file  Tobacco Use  . Smoking status: Former Smoker    Packs/day: 0.30    Years: 3.00    Pack years: 0.90    Types: Cigarettes    Start date: 11/08/1964    Last attempt to quit: 11/09/1967    Years since quitting: 49.9  . Smokeless tobacco: Never Used  Substance and Sexual Activity  . Alcohol use: No    Alcohol/week: 0.0 standard drinks  . Drug use: No  . Sexual activity: Not on file  Lifestyle  . Physical activity:    Days per week: Not on file    Minutes per session: Not on file  . Stress: Not on file  Relationships  . Social connections:    Talks on phone: Not on  file    Gets together: Not on file    Attends religious service: Not on file    Active member of club or organization: Not on file    Attends meetings of clubs or organizations: Not on file    Relationship status: Not on file  . Intimate partner violence:    Fear of current or ex partner: Not on file    Emotionally abused: Not on file    Physically abused: Not on file    Forced sexual activity: Not on file  Other Topics Concern  . Not on file  Social History Narrative   Lives at home, married   Left-handed   Daily caffeine use: coffee.      PHYSICAL EXAM  Vitals:   09/29/17 1003  Height: 4\' 11"  (1.499 m)   Body mass index is 28.5 kg/m.  Generalized: Well developed, in no acute distress  HEENT: Ecchymosis below the right eye.  Neurological examination  Mentation: Alert oriented to time, place, history taking. Follows all commands speech and language fluent Cranial nerve II-XII: Pupils were equal round reactive to light. Extraocular movements were full, visual field were full on confrontational test. Facial sensation and strength were normal. Uvula tongue midline. Head turning and shoulder shrug  were normal and symmetric. Motor: The motor testing reveals 5 over 5 strength of all 4 extremities. Good symmetric motor tone is noted throughout.  Sensory: Sensory testing is intact to soft touch on all 4 extremities. No evidence of extinction is noted.  Coordination: Cerebellar testing reveals good finger-nose-finger and heel-to-shin bilaterally.  Gait and station: Gait is normal.  Reflexes: Deep tendon reflexes are symmetric and normal bilaterally.   DIAGNOSTIC DATA (LABS, IMAGING, TESTING) - I reviewed patient records, labs, notes, testing and imaging myself where available.  Lab Results  Component Value Date   WBC 6.0 08/20/2017   HGB 11.2 (L) 08/20/2017   HCT 35.0 (L) 08/20/2017   MCV 89.5 08/20/2017   PLT 293 08/20/2017      Component Value Date/Time   NA 143  08/20/2017 0417   NA 143 03/04/2016 1135   K 3.9 08/20/2017 0417   CL 111 08/20/2017 0417   CO2 23 08/20/2017 0417   GLUCOSE 105 (H) 08/20/2017 0417   BUN <5 (L) 08/20/2017 0417   BUN 18 03/04/2016 1135   CREATININE 0.99 08/20/2017 0417   CREATININE 1.12 (H) 06/06/2015 0928   CALCIUM 6.9 (L) 08/20/2017 0417   PROT 7.2 08/18/2017 1300   PROT 6.8 11/23/2016 1109   ALBUMIN 3.2 (L) 08/18/2017 1300   ALBUMIN 4.4 11/23/2016 1109   AST 14 (L) 08/18/2017 1300   ALT 9 08/18/2017 1300   ALKPHOS 76 08/18/2017 1300   BILITOT 0.8 08/18/2017 1300   BILITOT 0.3 11/23/2016 1109   GFRNONAA 56 (L) 08/20/2017 0417   GFRAA >60 08/20/2017 0417   Lab Results  Component Value Date   CHOL 132 11/23/2016   HDL 70 11/23/2016   LDLCALC 51 11/23/2016   TRIG 56 11/23/2016   CHOLHDL 1.9 11/23/2016   Lab Results  Component Value Date   HGBA1C 6.5 (H) 10/15/2014   Lab Results  Component Value Date   VITAMINB12 281 01/30/2010   Lab Results  Component Value  Date   TSH 1.063 08/18/2017      ASSESSMENT AND PLAN 72 y.o. year old female  has a past medical history of Anxiety, Arthritis, Carpal tunnel syndrome, bilateral (07/31/2015), Cataract, Cervical spondylosis without myelopathy, Chronic chest pain, Chronic kidney disease, Chronic low back pain, Chronotropic incompetence, CKD (chronic kidney disease), stage III ([place]), Coronary artery disease, Depression, Esophageal stricture, GERD (gastroesophageal reflux disease), Gross hematuria, History of esophageal dilatation, History of gout, History of hiatal hernia, History of kidney stones, Hyperlipidemia, Hypertension, Hypothyroidism, Internal hemorrhoids, Irritable bowel syndrome, Mild aortic stenosis, Obesity, Orthostatic hypotension, Partial seizure disorder (HCC) (NEUROLOGIST-- DR Jannifer Franklin), PONV (postoperative nausea and vomiting), Pulmonary fibrosis ([place]), S/P pericardial cyst excision, Seizures ([place]), SVT (supraventricular tachycardia) ([place]), Trigger  finger, acquired (09/30/2015), and Vitamin D deficiency. here with:  1.  Seizures  Overall patient is doing well.  She will continue on Keppra 500 mg twice a day.  She is advised that she has any seizure events she should let us know.  She will follow-up in 6 months or sooner if needed.  I spent 15 minutes with the patient. 50% of this time was spent reviewing plan of care   Ward Givens, MSN, NP-C 09/29/2017, 10:06 AM [facility] [address], [place], [place] [address] [phone]

## 2017-09-30 IMAGING — DX DG CHEST 2V
2 series · 2 of 2 positions shown · non-contrast
Comparison: January 26, 2015.

CLINICAL DATA: History of lung biopsy.

EXAM:
CHEST  2 VIEW

[chest pa]
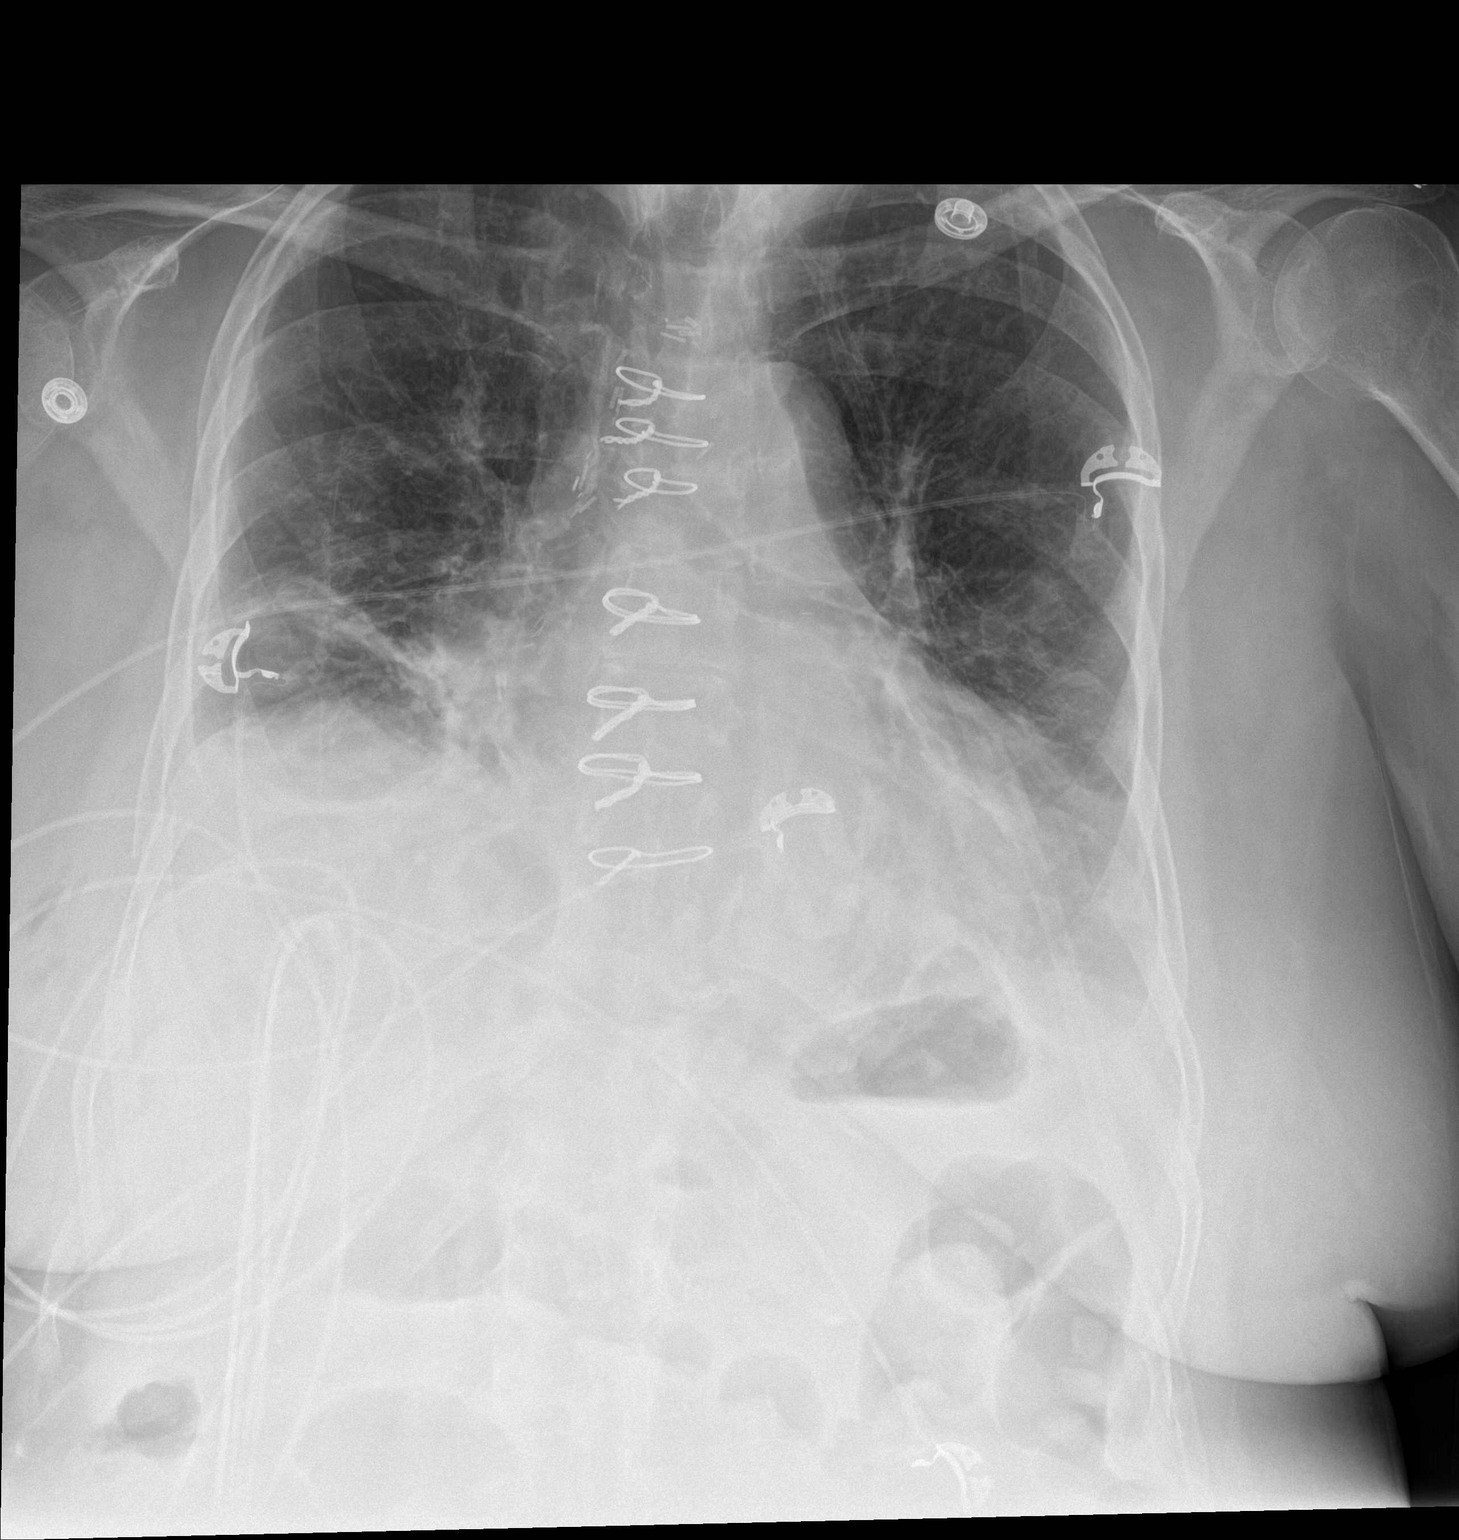

[chest lat]
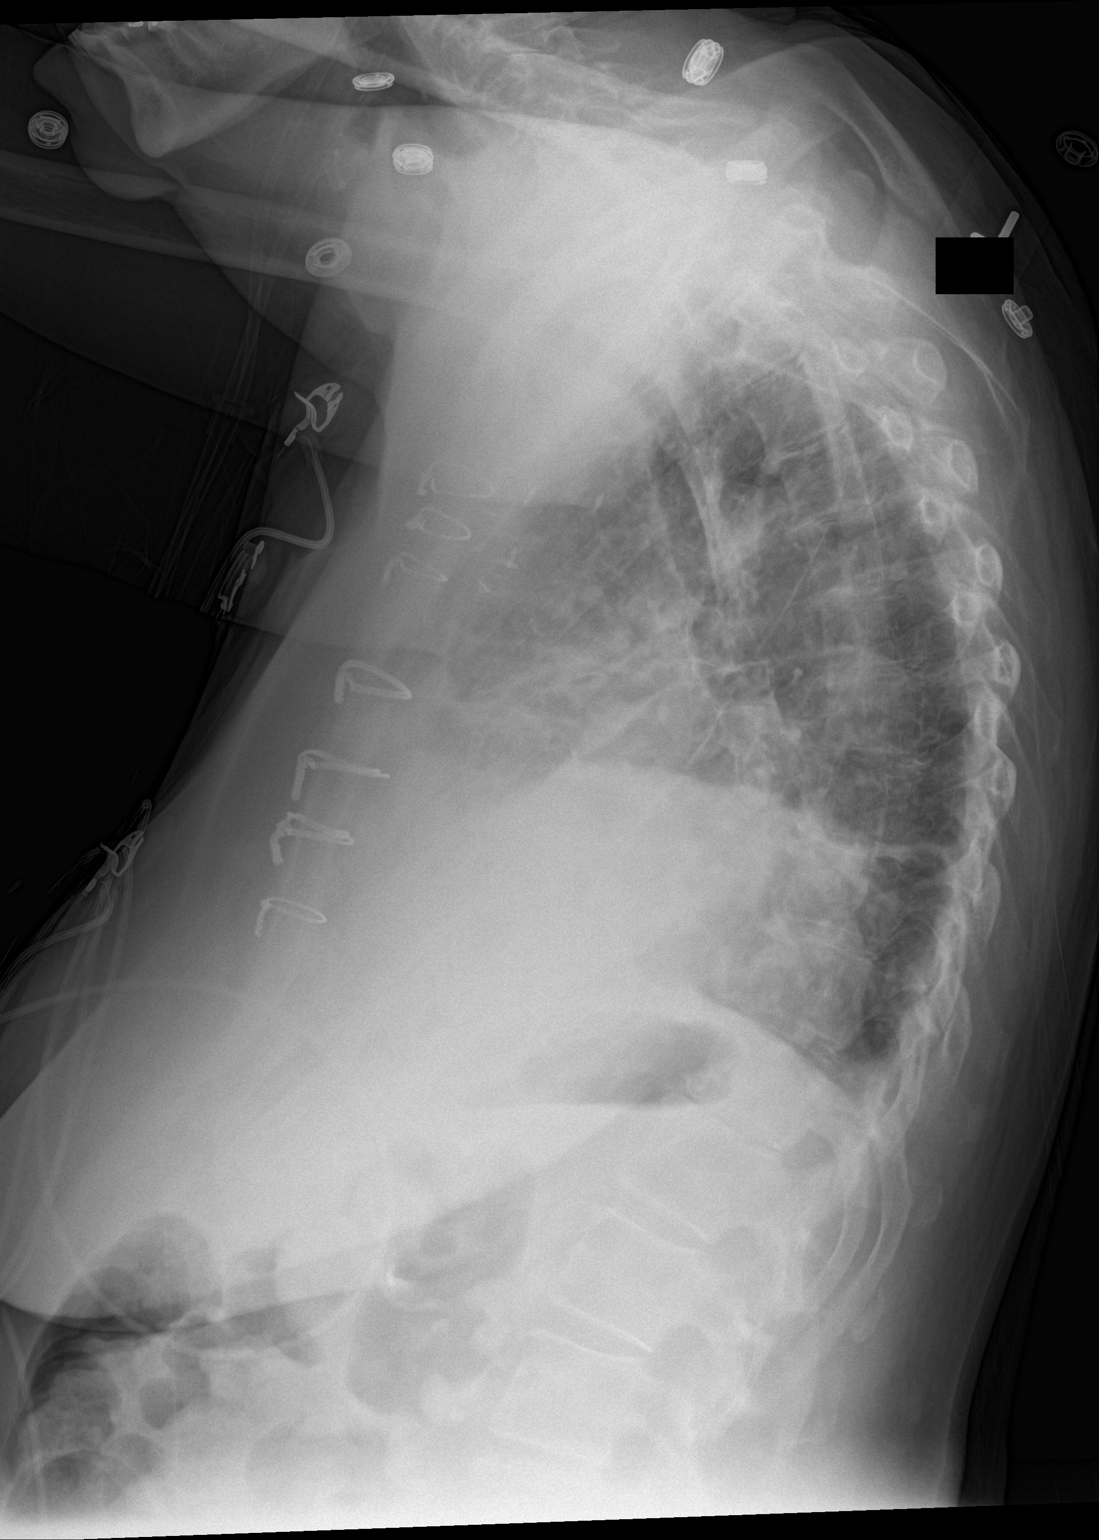

[2 of 2 positions shown; findings below may reference images not displayed]

FINDINGS: Stable cardiomegaly. Sternotomy wires are noted. Right-sided chest
tube has been removed without pneumothorax. Right subclavian
catheter has also been removed. Stable mild left pleural effusion is
noted. Stable bibasilar opacities are noted concerning for
subsegmental atelectasis. Stable right rib fracture is noted.
IMPRESSION: Stable bibasilar subsegmental atelectasis is noted with mild left
pleural effusion. No pneumothorax is noted status post right-sided
chest tube removal.

## 2017-10-03 NOTE — Telephone Encounter (Signed)
The pt's husband is aware that Dr Radford Pax has signed the provider part of the pts [employer] pt assistance application and that I have left it in the front office to be picked up.

## 2017-10-05 DIAGNOSIS — I129 Hypertensive chronic kidney disease with stage 1 through stage 4 chronic kidney disease, or unspecified chronic kidney disease: Secondary | ICD-10-CM | POA: Diagnosis not present

## 2017-10-05 DIAGNOSIS — N183 Chronic kidney disease, stage 3 (moderate): Secondary | ICD-10-CM | POA: Diagnosis not present

## 2017-10-10 ENCOUNTER — Telehealth: Payer: Self-pay | Admitting: Cardiology

## 2017-10-10 NOTE — Telephone Encounter (Signed)
Called pt to inform her that we are leaving 2 weeks of samples of eliquis 5 mg tablets at that front desk for pt to pick up, while waiting to hear from [facility] patient assistant program and if she has any other problems, questions or concerns to call our office back. Pt verbalized understanding.

## 2017-10-10 NOTE — Telephone Encounter (Signed)
° °  Patient calling the office for samples of medication:   1.  What medication and dosage are you requesting samples for? Eliquis 2.  Are you currently out of this medication? NO

## 2017-10-12 DIAGNOSIS — E559 Vitamin D deficiency, unspecified: Secondary | ICD-10-CM | POA: Diagnosis not present

## 2017-10-12 DIAGNOSIS — I951 Orthostatic hypotension: Secondary | ICD-10-CM | POA: Diagnosis not present

## 2017-10-12 DIAGNOSIS — N200 Calculus of kidney: Secondary | ICD-10-CM | POA: Diagnosis not present

## 2017-10-12 DIAGNOSIS — E889 Metabolic disorder, unspecified: Secondary | ICD-10-CM | POA: Diagnosis not present

## 2017-10-12 DIAGNOSIS — R801 Persistent proteinuria, unspecified: Secondary | ICD-10-CM | POA: Diagnosis not present

## 2017-10-12 DIAGNOSIS — D631 Anemia in chronic kidney disease: Secondary | ICD-10-CM | POA: Diagnosis not present

## 2017-10-12 DIAGNOSIS — I129 Hypertensive chronic kidney disease with stage 1 through stage 4 chronic kidney disease, or unspecified chronic kidney disease: Secondary | ICD-10-CM | POA: Diagnosis not present

## 2017-10-12 DIAGNOSIS — M9080 Osteopathy in diseases classified elsewhere, unspecified site: Secondary | ICD-10-CM | POA: Diagnosis not present

## 2017-10-12 DIAGNOSIS — N183 Chronic kidney disease, stage 3 (moderate): Secondary | ICD-10-CM | POA: Diagnosis not present

## 2017-10-26 NOTE — Telephone Encounter (Signed)
**Note De-Identified [name] Obfuscation** Letter received [name] fax from [facility] stating that they have approved the pt for pt asst with Eliquis. Approval good from 10/25/2017 until 01/10/2018.  Application case# [id number]

## 2017-11-03 ENCOUNTER — Encounter: Payer: Self-pay | Admitting: Cardiology

## 2017-11-03 ENCOUNTER — Ambulatory Visit: Payer: PPO | Admitting: Cardiology

## 2017-11-03 VITALS — BP 118/66 | HR 76 | Ht 59.0 in | Wt 141.1 lb

## 2017-11-03 DIAGNOSIS — I951 Orthostatic hypotension: Secondary | ICD-10-CM

## 2017-11-03 DIAGNOSIS — E7800 Pure hypercholesterolemia, unspecified: Secondary | ICD-10-CM | POA: Diagnosis not present

## 2017-11-03 DIAGNOSIS — I471 Supraventricular tachycardia: Secondary | ICD-10-CM | POA: Diagnosis not present

## 2017-11-03 DIAGNOSIS — I10 Essential (primary) hypertension: Secondary | ICD-10-CM | POA: Diagnosis not present

## 2017-11-03 DIAGNOSIS — I350 Nonrheumatic aortic (valve) stenosis: Secondary | ICD-10-CM

## 2017-11-03 DIAGNOSIS — I82409 Acute embolism and thrombosis of unspecified deep veins of unspecified lower extremity: Secondary | ICD-10-CM | POA: Diagnosis not present

## 2017-11-03 DIAGNOSIS — I2510 Atherosclerotic heart disease of native coronary artery without angina pectoris: Secondary | ICD-10-CM | POA: Diagnosis not present

## 2017-11-03 LAB — LIPID PANEL
CHOL/HDL RATIO: 2.1 ratio (ref 0.0–4.4)
CHOLESTEROL TOTAL: 147 mg/dL (ref 100–199)
HDL: 71 mg/dL (ref >39)
LDL Calculated: 55 mg/dL (ref 0–99)
TRIGLYCERIDES: 106 mg/dL (ref 0–149)
VLDL Cholesterol Cal: 21 mg/dL (ref 5–40)

## 2017-11-03 LAB — HEPATIC FUNCTION PANEL
ALK PHOS: 91 IU/L (ref 39–117)
ALT: 9 IU/L (ref 0–32)
AST: 15 IU/L (ref 0–40)
Albumin: 4 g/dL (ref 3.5–4.8)
BILIRUBIN TOTAL: 0.4 mg/dL (ref 0.0–1.2)
BILIRUBIN, DIRECT: 0.12 mg/dL (ref 0.00–0.40)
Total Protein: 6.8 g/dL (ref 6.0–8.5)

## 2017-11-03 NOTE — Progress Notes (Signed)
Cardiology Office Note:    Date:  11/03/2017   ID:  [name], DOB 1945/05/03, MRN [id number]  PCP:  Jasmine Ada, MD  Cardiologist:  No primary care provider on file.    Referring MD: Jasmine Ada, MD   Chief Complaint  Patient presents with  . Hypertension  . Coronary Artery Disease  . Hyperlipidemia    History of Present Illness:    [name] is a 72 y.o. female with a hx of CAD/HTN, pericardial cyst s/p resection and dyslipidemia. She has a history of chronic CP with esophageal strictures and has had esophageal dilatation.She underwent VATS procedure via mini thoracotomy syndrome with open lung bx and was diagnosed with interstitial lung disease. She has chronic CP that has been felt to be noncardiac in the past and has started to reoccur. She has chronic DOE.   She underwent a cardiopulmonary stress test by Pulmonary to evaluate her SOB and it showed deconditioning and poor HR response to exercise. At the request of the pulmonologist her CCB and BB were stoppedas they though her SOB was due to chronotropic incompetence.She then started having palpitations and was seen byher PCP,Dr. [name] was told to take bystolic PRN for palpitations.  She then saw Dr. [name] who put her back on Cardizem and dropped her amlodipine to 5mg  daily.A nuclear stress test was done in January 2018 which showed no ischemia. This was done for similar chest pain that she had in the past. This chest pain occurred around the time she lost her dog.    I saw her in early August and she was complaining of CP which improved after increasing her nitrate to 120mg  daily.  She was under a lot of stress taking care of her husband who has depression.  I recommended she see her PCP to discuss treatment of her anxiety and likely depression but she did not pursue this.  At that time I reassured her that CP she was having intermittently was likely related to stress but ordered a repeat  stress test which she later called and cancelled. She also had had an episode of syncope related to decreased PO intake and stopper her amlodipine, Lasix and doxazosin but continued her on Losartan 50mg  daily, Cardizem CD 240mg  daily and Bystolic 5mg  daily.    She was seen by Jasmine Ransom, PA a week later and was still having decreased PO intake with black diarrhea and was significantly orthostatic on exam. She was weak and dizzy.  She was found to be anemic and was sent to [facility] for evaluation.  She was given IVF and her Bystolic was stopped and Cardizem decreased.  She was seen by GI for chronic diarrhea.  She was also dx with acute left peroneal vein DVT and started on Eliquis.   Her husband called in on 8/15 with complaints that her dBP was elevated and she was instructed to restart her Bystolic 2.5mg  daily and change to Cardizem CD 120mg  daily.    She saw Jasmine Lis, PA on 09/01/2017 and had not started her Bystolic or Cardizem back and BP was elevated at 164/41mmHg.  She was also complaining of bleeding gums and her Plavix was stopped and she was continued on ASA (also on DOAC for DVT).  She was instructed to restart Bystolic and Cardizem and only use Lasix PRN for edema.    She is here today for followup and is quite upset.  She was very mad that she had to see an extender  and says that she will not see an extender again.  She accused me of not being there for her when she was sick and told me that I should be working and not taking vacation.  She accused me of not addressing her CP despite her cancelling the stress test I ordered at the last OV.  She says that she continue to have CP that is intermittent and has not changed and occurs more with stress.  She also has chronic DOE which has not changed.  She has intermittent LE edema, and dizziness but no further syncope.  Past Medical History:  Diagnosis Date  . Anxiety   . Arthritis   . Carpal tunnel syndrome, bilateral 07/31/2015  . Cataract   .  Cervical spondylosis without myelopathy   . Chronic chest pain   . Chronic kidney disease   . Chronic low back pain   . Chronotropic incompetence   . CKD (chronic kidney disease), stage III ([place])   . Coronary artery disease    a. inf-post MI 2011 s/p DES to RCA. b. DES to RCA 06/2010. c. DES to LAD 2014, residual dz treated medically.  . Depression   . Esophageal stricture   . GERD (gastroesophageal reflux disease)   . Gross hematuria   . History of esophageal dilatation    FOR STRIUCTURE  . History of gout   . History of hiatal hernia   . History of kidney stones   . Hyperlipidemia   . Hypertension   . Hypothyroidism   . Internal hemorrhoids   . Irritable bowel syndrome   . Mild aortic stenosis    by echo 2018  . Obesity   . Orthostatic hypotension   . Partial seizure disorder (HCC) NEUROLOGIST-- DR WILLIS   NOCTURNAL  . PONV (postoperative nausea and vomiting)    hard time getting iv site-had to do neck stick 2 yrs ago  . Pulmonary fibrosis ([place])   . S/P pericardial cyst excision    02-05-2013  benign  . Seizures ([place])    none in yrs  . SVT (supraventricular tachycardia) ([place])   . Trigger finger, acquired 09/30/2015   Right middle finger  . Vitamin D deficiency     Past Surgical History:  Procedure Laterality Date  . BIOPSY OF MEDIASTINAL MASS N/A 02/05/2013   Procedure: RESECTION OF MEDIASTINAL MASS;  Surgeon: Ivin Poot, MD;  Location: [place];  Service: Thoracic;  Laterality: N/A;  . CARDIAC CATHETERIZATION Right 01/24/2015   Procedure: right femoral ARTERIAL LINE INSERTION;  Surgeon: Ivin Poot, MD;  Location: [place];  Service: Thoracic;  Laterality: Right;  . CARDIOVASCULAR STRESS TEST  11-22-2013  dr Irish Lack   normal lexiscan study/  no ischemia/  normal LVF and wall motion/ ef 81%  . COLONOSCOPY  last one 2014  . CORONARY ANGIOGRAM  03/10/2011   Procedure: CORONARY ANGIOGRAM;  Surgeon: Jettie Booze, MD;  Location[facility];  Service:  Cardiovascular;;  . CORONARY ANGIOPLASTY WITH STENT PLACEMENT  11-22-2009  dr Irish Lack   Acute inferoposterior MI/  ef 60%,  PCI with DES x1 to dRCA,  25%  mLAD  . CORONARY ANGIOPLASTY WITH STENT PLACEMENT  06-26-2010  dr Daneen Schick   Cutting balloon angioplasty to dRCA with DES x1,  40% mLAD (non-obstructive CAD)  . CYSTOSCOPY WITH RETROGRADE PYELOGRAM, URETEROSCOPY AND STENT PLACEMENT Right 02/13/2014   Procedure: CYSTOSCOPY WITH RETROGRADE PYELOGRAM, URETEROSCOPY AND STENT PLACEMENT;  Surgeon: Bernestine Amass, MD;  Location: [place];  Service: Urology;  Laterality: Right;  . ECTOPIC PREGNANCY SURGERY  YRS AGO   SALPINGECTOMY  . ESOPHAGOGASTRODUODENOSCOPY N/A 02/15/2014   Procedure: ESOPHAGOGASTRODUODENOSCOPY (EGD);  Surgeon: Jerene Bears, MD;  Location: [facility];  Service: Endoscopy;  Laterality: N/A;  . ESOPHAGOGASTRODUODENOSCOPY (EGD) WITH ESOPHAGEAL DILATION  05-20-2011  . HOLMIUM LASER APPLICATION Right 02/20/4763   Procedure: HOLMIUM LASER APPLICATION;  Surgeon: Bernestine Amass, MD;  Location: [facility];  Service: Urology;  Laterality: Right;  . LEFT HEART CATHETERIZATION WITH CORONARY ANGIOGRAM N/A 03/14/2012   Procedure: LEFT HEART CATHETERIZATION WITH CORONARY ANGIOGRAM;  Surgeon: Sueanne Margarita, MD;  Location: [facility] CATH LAB;  Service: Cardiovascular;  Laterality: N/A;  Normal LM,  50% pLAD,  70% mLAD,  D2 50-70%, very tortuous LAD,  70-82% ostial LCFX,  50% in-stent restenosis of  dRCA and mRCA  stent and 50-70% ostial PDA,  normal LVSF, ef 60%  . LUNG BIOPSY Right 01/24/2015   Procedure: RIGHT LUNG BIOPSY;  Surgeon: Ivin Poot, MD;  Location: [place];  Service: Thoracic;  Laterality: Right;  . MEDIASTERNOTOMY N/A 02/05/2013   Procedure: MEDIAN STERNOTOMY;  Surgeon: Ivin Poot, MD;  Location: [place];  Service: Thoracic;  Laterality: N/A;  . MEDIASTERNOTOMY Left 02/05/2013  . NEEDLE GUIDED EXCISION BREAST CALCIFICATIONS Right 08-09-2008  .  PERCUTANEOUS CORONARY STENT INTERVENTION (PCI-S) N/A 04/03/2012   Procedure: PERCUTANEOUS CORONARY STENT INTERVENTION (PCI-S);  Surgeon: Jettie Booze, MD;  Location: [facility] CATH LAB;  Service: Cardiovascular;  Laterality: N/A;   Successful PCI  mLAD with 2.75x12 Promus stent, postdilated to >0.19mm  . THORACOTOMY Right 01/24/2015   Procedure: RIGHT MINI/LIMITED THORACOTOMY;  Surgeon: Ivin Poot, MD;  Location: [place];  Service: Thoracic;  Laterality: Right;  . TRANSTHORACIC ECHOCARDIOGRAM  11-17-2011   grade I diastolic dysfunction/  ef 55-60%  . VIDEO BRONCHOSCOPY N/A 01/24/2015   Procedure: RIGHT VIDEO BRONCHOSCOPY;  Surgeon: Ivin Poot, MD;  Location: [facility] OR;  Service: Thoracic;  Laterality: N/A;    Current Medications: Current Meds  Medication Sig  . ALPRAZolam (XANAX) 0.25 MG tablet Take 0.25 mg by mouth 3 (three) times daily.   Marland Kitchen aspirin EC 81 MG tablet Take 81 mg by mouth daily.  . calcitRIOL (ROCALTROL) 0.25 MCG capsule Take 0.25 mcg by mouth daily.   . cholestyramine (QUESTRAN) 4 g packet Take 1 packet (4 g total) by mouth 2 (two) times daily. (Patient taking differently: Take 4 g by mouth daily. )  . diltiazem (CARDIZEM CD) 120 MG 24 hr capsule Take 1 capsule (120 mg total) by mouth daily.  . DULoxetine (CYMBALTA) 30 MG capsule Take 30 mg by mouth daily.   Marland Kitchen ELIQUIS STARTER PACK (ELIQUIS STARTER PACK) 5 MG TABS .Take two 5 mg tabs twice daily for 6 days then on day 7 switch to one 5 mg tablet twice daily.  . ferrous sulfate 325 (65 FE) MG tablet Take 325 mg by mouth daily with breakfast.   . levETIRAcetam (KEPPRA) 500 MG tablet TAKE 1 TABLET BY MOUTH TWICE A DAY  . levothyroxine (SYNTHROID, LEVOTHROID) 112 MCG tablet Take 112 mcg by mouth daily.  . magnesium oxide (MAG-OX) 400 MG tablet Take 1 tablet by mouth 2 (two) times daily.  . nebivolol (BYSTOLIC) 5 MG tablet Take half (1/2) tablet (2.5 mg) by mouth daily.  . nitroGLYCERIN (NITROSTAT) 0.4 MG SL tablet PLACE 1 TABLET  (0.4 MG TOTAL) UNDER THE TONGUE EVERY 5 (FIVE) MINUTES AS NEEDED  FOR CHEST PAIN.  Marland Kitchen pantoprazole (PROTONIX) 40 MG tablet TAKE 1 TABLET (40 MG TOTAL) BY MOUTH 2 (TWO) TIMES DAILY.  Marland Kitchen potassium chloride SA (K-DUR,KLOR-CON) 20 MEQ tablet Take 1 tablet by mouth daily.  . rosuvastatin (CRESTOR) 20 MG tablet TAKE 1 TABLET (20 MG TOTAL) BY MOUTH EVERY MORNING. (Patient taking differently: Take 20 mg by mouth every evening. )  . saccharomyces boulardii (FLORASTOR) 250 MG capsule Take 1 capsule (250 mg total) by mouth 2 (two) times daily.     Allergies:   Patient has no known allergies.   Social History   Socioeconomic History  . Marital status: Married    Spouse name: [name]  . Number of children: 0  . Years of education: hs  . Highest education level: Not on file  Occupational History  . Occupation: Retired    Fish farm manager: RETIRED  Social Needs  . Financial resource strain: Not on file  . Food insecurity:    Worry: Not on file    Inability: Not on file  . Transportation needs:    Medical: Not on file    Non-medical: Not on file  Tobacco Use  . Smoking status: Former Smoker    Packs/day: 0.30    Years: 3.00    Pack years: 0.90    Types: Cigarettes    Start date: 11/08/1964    Last attempt to quit: 11/09/1967    Years since quitting: 50.0  . Smokeless tobacco: Never Used  Substance and Sexual Activity  . Alcohol use: No    Alcohol/week: 0.0 standard drinks  . Drug use: No  . Sexual activity: Not on file  Lifestyle  . Physical activity:    Days per week: Not on file    Minutes per session: Not on file  . Stress: Not on file  Relationships  . Social connections:    Talks on phone: Not on file    Gets together: Not on file    Attends religious service: Not on file    Active member of club or organization: Not on file    Attends meetings of clubs or organizations: Not on file    Relationship status: Not on file  Other Topics Concern  . Not on file  Social History Narrative    Lives at home, married   Left-handed   Daily caffeine use: coffee.     Family History: The patient's family history includes Breast cancer in her sister and unknown relative; CVA in her mother; Coronary artery disease in her father; Diabetes Mellitus I in her father; Emphysema in her father; Heart attack in her mother; Heart disease in her father; Hypertension in her father. There is no history of Colon cancer, Stomach cancer, Esophageal cancer, or Rectal cancer.  ROS:   Please see the history of present illness.    ROS  All other systems reviewed and negative.   EKGs/Labs/Other Studies Reviewed:    The following studies were reviewed today: none  EKG:  EKG is not ordered today.    Recent Labs: 08/18/2017: ALT 9; TSH 1.063 08/20/2017: BUN <5; Creatinine, Ser 0.99; Hemoglobin 11.2; Magnesium 2.3; Platelets 293; Potassium 3.9; Sodium 143   Recent Lipid Panel    Component Value Date/Time   CHOL 132 11/23/2016 1109   TRIG 56 11/23/2016 1109   HDL 70 11/23/2016 1109   CHOLHDL 1.9 11/23/2016 1109   CHOLHDL 1.8 06/06/2015 0928   VLDL 11 06/06/2015 0928   LDLCALC 51 11/23/2016 1109    Physical Exam:  VS:  BP 118/66   Pulse 76   Ht 4\' 11"  (1.499 m)   Wt 141 lb 1.9 oz (64 kg)   SpO2 97%   BMI 28.50 kg/m     Wt Readings from Last 3 Encounters:  11/03/17 141 lb 1.9 oz (64 kg)  09/29/17 141 lb (64 kg)  09/13/17 141 lb 2 oz (64 kg)     GEN:  Well nourished, well developed in no acute distress HEENT: Normal NECK: No JVD; No carotid bruits LYMPHATICS: No lymphadenopathy CARDIAC: RRR, no murmurs, rubs, gallops RESPIRATORY:  Clear to auscultation without rales, wheezing or rhonchi  ABDOMEN: Soft, non-tender, non-distended MUSCULOSKELETAL:  No edema; No deformity  SKIN: Warm and dry NEUROLOGIC:  Alert and oriented x 3 PSYCHIATRIC:  Normal affect   ASSESSMENT:    1. Atherosclerosis of native coronary artery of native heart without angina pectoris   2. Essential  hypertension   3. Mild aortic stenosis   4. Orthostatic hypotension   5. Paroxysmal SVT (supraventricular tachycardia) (HCC)   6. Acute deep vein thrombosis (DVT) of lower extremity, unspecified laterality, unspecified vein (HCC)   7. Pure hypercholesterolemia    PLAN:    In order of problems listed above:  1.  ASCAD - s/p inf-post MI 2011 s/p DES to RCA.Recurrent CP s/pDES to RCA 6/2012and thenDES to LAD 2014withresidual dz treated medically.  She has a long hx of chronic noncardiac CP related to esophageal spasm and stricture.  Her last stress test was showed no ishemia on 02/04/2016.  She will continue on aspirin 81 mg daily, beta-blocker and statin.  Her Plavix was stopped due to problems with bleeding gums.  We discussed repeat stress testing due to CP but she does not want to pursue it at this time as her CP has not changed any since her last stress test.  I encouraged her to make an appt with her PCP to discuss her anxiety and depression which I think are playing a key role in her health problems  2.  HTN - BP is controlled on exam today.  She will continue on Cardizem CD 120 mg daily and Bystolic 2.5 mg daily.  3.  Mild AS -very mild by echo a year ago with mean gradient 11 mmHg.  4.  Orthostatic hypotension -this is been fairly stable although she still has problems with dizziness . She has not had any further syncope after her episode of dehydration.   5.  SVT -she has had no recurrence of palpitations.  She will continue on Cardizem and Bystolic.  6.  DVT -continue Eliquis per PCP  7.  Hyperlipidemia with LDL goal less than 70.  She will continue Crestor 20 mg daily.  Her LDL was 81 on 02/23/2017.  I will repeat an FLP.    Medication Adjustments/Labs and Tests Ordered: Current medicines are reviewed at length with the patient today.  Concerns regarding medicines are outlined above.  No orders of the defined types were placed in this encounter.  No orders of the defined  types were placed in this encounter.   Signed, Fransico Him, MD  11/03/2017 8:36 AM    [place]

## 2017-11-03 NOTE — Patient Instructions (Signed)
Medication Instructions:  Your physician recommends that you continue on your current medications as directed. Please refer to the Current Medication list given to you today.  If you need a refill on your cardiac medications before your next appointment, please call your pharmacy.   Lab work: Today: LFT and Lipids  If you have labs (blood work) drawn today and your tests are completely normal, you will receive your results only by: Marland Kitchen MyChart Message (if you have MyChart) OR . A paper copy in the mail If you have any lab test that is abnormal or we need to change your treatment, we will call you to review the results.  Follow-Up: At [facility], you and your health needs are our priority.  As part of our continuing mission to provide you with exceptional heart care, we have created designated Provider Care Teams.  These Care Teams include your primary Cardiologist (physician) and Advanced Practice Providers (APPs -  Physician Assistants and Nurse Practitioners) who all work together to provide you with the care you need, when you need it. You will need a follow up appointment in 1 years.  Please call our office 2 months in advance to schedule this appointment.  You may see Dr. Radford Pax  or one of the following Advanced Practice Providers on your designated Care Team:   [place], PA-C Melina Copa, PA-C . Ermalinda Barrios, PA-C

## 2017-11-14 ENCOUNTER — Other Ambulatory Visit: Payer: Self-pay | Admitting: Family Medicine

## 2017-11-14 ENCOUNTER — Other Ambulatory Visit: Payer: Self-pay

## 2017-11-14 ENCOUNTER — Telehealth: Payer: Self-pay

## 2017-11-14 DIAGNOSIS — Z1231 Encounter for screening mammogram for malignant neoplasm of breast: Secondary | ICD-10-CM

## 2017-11-14 MED ORDER — ROSUVASTATIN CALCIUM 20 MG PO TABS: 20.0000 mg | ORAL_TABLET | Freq: Every morning | ORAL | 3 refills | Status: DC

## 2017-11-14 NOTE — Telephone Encounter (Signed)
Patient called requesting a copy of her recent blood work. She would like to pick this up before Thursday. She can be reach at [phone]

## 2017-11-16 NOTE — Telephone Encounter (Signed)
Spoke with pt this am. I have a copy of her lab work ready she will pick up today.

## 2017-12-15 DIAGNOSIS — E039 Hypothyroidism, unspecified: Secondary | ICD-10-CM | POA: Diagnosis not present

## 2017-12-15 DIAGNOSIS — K219 Gastro-esophageal reflux disease without esophagitis: Secondary | ICD-10-CM | POA: Diagnosis not present

## 2017-12-15 DIAGNOSIS — I10 Essential (primary) hypertension: Secondary | ICD-10-CM | POA: Diagnosis not present

## 2017-12-15 DIAGNOSIS — E7800 Pure hypercholesterolemia, unspecified: Secondary | ICD-10-CM | POA: Diagnosis not present

## 2017-12-15 DIAGNOSIS — F411 Generalized anxiety disorder: Secondary | ICD-10-CM | POA: Diagnosis not present

## 2017-12-27 ENCOUNTER — Ambulatory Visit: Payer: PPO

## 2018-01-12 DIAGNOSIS — E039 Hypothyroidism, unspecified: Secondary | ICD-10-CM | POA: Diagnosis not present

## 2018-01-12 DIAGNOSIS — E200 Idiopathic hypoparathyroidism: Secondary | ICD-10-CM | POA: Diagnosis not present

## 2018-01-12 DIAGNOSIS — E1165 Type 2 diabetes mellitus with hyperglycemia: Secondary | ICD-10-CM | POA: Diagnosis not present

## 2018-01-12 DIAGNOSIS — R801 Persistent proteinuria, unspecified: Secondary | ICD-10-CM | POA: Diagnosis not present

## 2018-01-12 DIAGNOSIS — N183 Chronic kidney disease, stage 3 (moderate): Secondary | ICD-10-CM | POA: Diagnosis not present

## 2018-01-12 DIAGNOSIS — I129 Hypertensive chronic kidney disease with stage 1 through stage 4 chronic kidney disease, or unspecified chronic kidney disease: Secondary | ICD-10-CM | POA: Diagnosis not present

## 2018-01-13 ENCOUNTER — Telehealth: Payer: Self-pay | Admitting: Cardiology

## 2018-01-13 NOTE — Telephone Encounter (Signed)
New Message:    Patient husband calling concerning some of his wife medication. Please call patient husband.

## 2018-01-13 NOTE — Telephone Encounter (Signed)
Spoke with the patient's husband, he wanted to start the prior auth for Eliquis again. His wife has probably 3 weeks left.

## 2018-01-16 ENCOUNTER — Other Ambulatory Visit (HOSPITAL_COMMUNITY): Payer: Self-pay | Admitting: Cardiology

## 2018-01-16 NOTE — Telephone Encounter (Signed)
Eliquis 5mg  refill request received; pt is 73 yrs old, wt-64kg, Crea-0.99 on 08/20/17, last seen by Dr. Radford Pax on 11/03/17; will send in refill to requested pharmacy.

## 2018-01-16 NOTE — Telephone Encounter (Signed)
I attempted a PA on the pts Eliquis but was unable as I received the following message: Information regarding your request  Available without authorization.   I called the pts husband and made him aware. He stated that he is interested in pt asst with [facility] for his wife's Eliquis not a PA.  He states that his wife does have Part D insurance which covers her medications but that they cannot afford Eliquis.  I have advised him that in order to apply for pt asst with [employer] (when the pt has Part D) the pt will have to have her proof of income from 2019 and her 2020 out of pocket expense report from her pharmacy.  He states that they already know they cannot afford Eliquis but is going to call their pharmacy to find out what the cost will be. I have discussed with the pts husband the pt changing to Coumadin if Eliquis is too expensive.  He states that he will call back if they cannot afford Eliquis at its current cost.  Also, The pt has Medicare and under medicare part D rules once a medication has been lowered to a tier 3 as Eliquis has been it cannot be lowered any further as a brand name medication.  Will forward note to Dr Radford Pax and her nurse as Juluis Rainier.

## 2018-01-17 ENCOUNTER — Telehealth: Payer: Self-pay | Admitting: Cardiology

## 2018-01-17 DIAGNOSIS — I129 Hypertensive chronic kidney disease with stage 1 through stage 4 chronic kidney disease, or unspecified chronic kidney disease: Secondary | ICD-10-CM | POA: Diagnosis not present

## 2018-01-17 DIAGNOSIS — D631 Anemia in chronic kidney disease: Secondary | ICD-10-CM | POA: Diagnosis not present

## 2018-01-17 DIAGNOSIS — E889 Metabolic disorder, unspecified: Secondary | ICD-10-CM | POA: Diagnosis not present

## 2018-01-17 DIAGNOSIS — N183 Chronic kidney disease, stage 3 (moderate): Secondary | ICD-10-CM | POA: Diagnosis not present

## 2018-01-17 DIAGNOSIS — N200 Calculus of kidney: Secondary | ICD-10-CM | POA: Diagnosis not present

## 2018-01-17 DIAGNOSIS — M9080 Osteopathy in diseases classified elsewhere, unspecified site: Secondary | ICD-10-CM | POA: Diagnosis not present

## 2018-01-17 DIAGNOSIS — E559 Vitamin D deficiency, unspecified: Secondary | ICD-10-CM | POA: Diagnosis not present

## 2018-01-17 MED ORDER — APIXABAN 5 MG PO TABS: 5.0000 mg | ORAL_TABLET | Freq: Two times a day (BID) | ORAL | 1 refills | Status: DC

## 2018-01-17 NOTE — Telephone Encounter (Signed)
New Message    *STAT* If patient is at the pharmacy, call can be transferred to refill team.   1. Which medications need to be refilled? (please list name of each medication and dose if known) Eliquis 5mg   2. Which pharmacy/location (including street and city if local pharmacy) is medication to be sent to? [facility] [address]  3. Do they need a 30 day or 90 day supply? 90 day supply

## 2018-01-17 NOTE — Telephone Encounter (Signed)
Pt last saw Dr Radford Pax 11/03/17, last labs 08/20/17 Creat 0.99, age 73, weight 64kg, based on specified criteria pt is on appropriate dosage of Eliquis 5mg  BID.  Will refill rx.

## 2018-01-18 ENCOUNTER — Telehealth: Payer: Self-pay | Admitting: Internal Medicine

## 2018-01-18 NOTE — Telephone Encounter (Signed)
[name] was confused about what the cholestyramine was for. Discussed with him that if pt is not having loose stools or diarrhea then she didn't have to take it. If she had issues with constipation then she can take an otc stool softener. Husband verbalized understanding.

## 2018-01-19 DIAGNOSIS — I10 Essential (primary) hypertension: Secondary | ICD-10-CM | POA: Diagnosis not present

## 2018-01-19 DIAGNOSIS — E1165 Type 2 diabetes mellitus with hyperglycemia: Secondary | ICD-10-CM | POA: Diagnosis not present

## 2018-01-19 DIAGNOSIS — E039 Hypothyroidism, unspecified: Secondary | ICD-10-CM | POA: Diagnosis not present

## 2018-01-19 DIAGNOSIS — N189 Chronic kidney disease, unspecified: Secondary | ICD-10-CM | POA: Diagnosis not present

## 2018-01-19 DIAGNOSIS — E200 Idiopathic hypoparathyroidism: Secondary | ICD-10-CM | POA: Diagnosis not present

## 2018-01-20 DIAGNOSIS — Z79899 Other long term (current) drug therapy: Secondary | ICD-10-CM | POA: Diagnosis not present

## 2018-01-30 ENCOUNTER — Ambulatory Visit
Admission: RE | Admit: 2018-01-30 | Discharge: 2018-01-30 | Disposition: A | Discharge status: Home / Self Care | Payer: PPO | Source: Ambulatory Visit | Attending: Family Medicine | Admitting: Family Medicine

## 2018-01-30 DIAGNOSIS — Z1231 Encounter for screening mammogram for malignant neoplasm of breast: Secondary | ICD-10-CM

## 2018-02-09 DIAGNOSIS — K529 Noninfective gastroenteritis and colitis, unspecified: Secondary | ICD-10-CM | POA: Diagnosis not present

## 2018-02-09 DIAGNOSIS — R05 Cough: Secondary | ICD-10-CM | POA: Diagnosis not present

## 2018-02-17 ENCOUNTER — Ambulatory Visit: Payer: PPO | Admitting: Internal Medicine

## 2018-02-17 DIAGNOSIS — M25571 Pain in right ankle and joints of right foot: Secondary | ICD-10-CM | POA: Diagnosis not present

## 2018-02-18 ENCOUNTER — Other Ambulatory Visit: Payer: Self-pay | Admitting: Cardiology

## 2018-02-20 NOTE — Telephone Encounter (Signed)
PLAN:    In order of problems listed above:  1.  ASCAD - s/p inf-post MI 2011 s/p DES to RCA.Recurrent CP s/pDES to RCA 6/2012and thenDES to LAD 2014withresidual dz treated medically.  She has a long hx of chronic noncardiac CP related to esophageal spasm and stricture.  Her last stress test was showed no ishemia on 02/04/2016.  She will continue on aspirin 81 mg daily, beta-blocker and statin.  Her Plavix was stopped due to problems with bleeding gums.  We discussed repeat stress testing due to CP but she does not want to pursue it at this time as her CP has not changed any since her last stress test.  I encouraged her to make an appt with her PCP to discuss her anxiety and depression which I think are playing a key role in her health problems

## 2018-02-28 DIAGNOSIS — M25571 Pain in right ankle and joints of right foot: Secondary | ICD-10-CM | POA: Diagnosis not present

## 2018-03-06 DIAGNOSIS — M25571 Pain in right ankle and joints of right foot: Secondary | ICD-10-CM | POA: Diagnosis not present

## 2018-03-07 ENCOUNTER — Telehealth: Payer: Self-pay | Admitting: Internal Medicine

## 2018-03-07 MED ORDER — PANTOPRAZOLE SODIUM 40 MG PO TBEC: 40.0000 mg | DELAYED_RELEASE_TABLET | Freq: Two times a day (BID) | ORAL | 1 refills | Status: DC

## 2018-03-07 NOTE — Telephone Encounter (Signed)
Pt called in wanting to get refills for medication pt is not wanting to sched appt. With pa and dr. Has no aval. appt until April and April sched is not up. Can this medication be refill for pt without the appt. She is wanting a call. thanks

## 2018-03-07 NOTE — Telephone Encounter (Signed)
Pantoprazole refilled. 

## 2018-03-07 NOTE — Telephone Encounter (Signed)
Pt is scheduled 3.5.20 with Amy.  Pt requested [facility].

## 2018-03-08 ENCOUNTER — Emergency Department (HOSPITAL_COMMUNITY)
Admission: EM | Admit: 2018-03-08 | Discharge: 2018-03-08 | Disposition: A | Discharge status: Home / Self Care | Payer: PPO | Attending: Emergency Medicine | Admitting: Emergency Medicine

## 2018-03-08 ENCOUNTER — Encounter (HOSPITAL_COMMUNITY): Payer: Self-pay | Admitting: Emergency Medicine

## 2018-03-08 ENCOUNTER — Other Ambulatory Visit: Payer: Self-pay

## 2018-03-08 DIAGNOSIS — Z79899 Other long term (current) drug therapy: Secondary | ICD-10-CM | POA: Diagnosis not present

## 2018-03-08 DIAGNOSIS — N183 Chronic kidney disease, stage 3 (moderate): Secondary | ICD-10-CM | POA: Diagnosis not present

## 2018-03-08 DIAGNOSIS — Z87891 Personal history of nicotine dependence: Secondary | ICD-10-CM | POA: Diagnosis not present

## 2018-03-08 DIAGNOSIS — E860 Dehydration: Secondary | ICD-10-CM | POA: Diagnosis not present

## 2018-03-08 DIAGNOSIS — Z7901 Long term (current) use of anticoagulants: Secondary | ICD-10-CM | POA: Diagnosis not present

## 2018-03-08 DIAGNOSIS — I129 Hypertensive chronic kidney disease with stage 1 through stage 4 chronic kidney disease, or unspecified chronic kidney disease: Secondary | ICD-10-CM | POA: Diagnosis not present

## 2018-03-08 DIAGNOSIS — K529 Noninfective gastroenteritis and colitis, unspecified: Secondary | ICD-10-CM | POA: Insufficient documentation

## 2018-03-08 DIAGNOSIS — E039 Hypothyroidism, unspecified: Secondary | ICD-10-CM | POA: Insufficient documentation

## 2018-03-08 DIAGNOSIS — I2510 Atherosclerotic heart disease of native coronary artery without angina pectoris: Secondary | ICD-10-CM | POA: Insufficient documentation

## 2018-03-08 DIAGNOSIS — Z7982 Long term (current) use of aspirin: Secondary | ICD-10-CM | POA: Diagnosis not present

## 2018-03-08 DIAGNOSIS — R1110 Vomiting, unspecified: Secondary | ICD-10-CM | POA: Diagnosis present

## 2018-03-08 LAB — COMPREHENSIVE METABOLIC PANEL
ALK PHOS: 92 U/L (ref 38–126)
ALT: 8 U/L (ref 0–44)
AST: 14 U/L — ABNORMAL LOW (ref 15–41)
Albumin: 3.8 g/dL (ref 3.5–5.0)
Anion gap: 11 (ref 5–15)
BUN: 16 mg/dL (ref 8–23)
CALCIUM: 8.9 mg/dL (ref 8.9–10.3)
CO2: 24 mmol/L (ref 22–32)
CREATININE: 1.25 mg/dL — AB (ref 0.44–1.00)
Chloride: 104 mmol/L (ref 98–111)
GFR calc non Af Amer: 43 mL/min — ABNORMAL LOW (ref >60)
GFR, EST AFRICAN AMERICAN: 50 mL/min — AB (ref >60)
GLUCOSE: 122 mg/dL — AB (ref 70–99)
Potassium: 3.5 mmol/L (ref 3.5–5.1)
SODIUM: 139 mmol/L (ref 135–145)
Total Bilirubin: 0.8 mg/dL (ref 0.3–1.2)
Total Protein: 8.3 g/dL — ABNORMAL HIGH (ref 6.5–8.1)

## 2018-03-08 LAB — DIFFERENTIAL
Abs Immature Granulocytes: 0.01 10*3/uL (ref 0.00–0.07)
BASOS ABS: 0 10*3/uL (ref 0.0–0.1)
Basophils Relative: 0 %
EOS ABS: 0.1 10*3/uL (ref 0.0–0.5)
Eosinophils Relative: 1 %
Immature Granulocytes: 0 %
Lymphocytes Relative: 9 %
Lymphs Abs: 0.7 10*3/uL (ref 0.7–4.0)
MONO ABS: 0.5 10*3/uL (ref 0.1–1.0)
Monocytes Relative: 6 %
Neutro Abs: 6.6 10*3/uL (ref 1.7–7.7)
Neutrophils Relative %: 84 %

## 2018-03-08 LAB — CBC
HCT: 44 % (ref 36.0–46.0)
Hemoglobin: 13.5 g/dL (ref 12.0–15.0)
MCH: 28.1 pg (ref 26.0–34.0)
MCHC: 30.7 g/dL (ref 30.0–36.0)
MCV: 91.7 fL (ref 80.0–100.0)
NRBC: 0 % (ref 0.0–0.2)
Platelets: 417 10*3/uL — ABNORMAL HIGH (ref 150–400)
RBC: 4.8 MIL/uL (ref 3.87–5.11)
RDW: 12.6 % (ref 11.5–15.5)
WBC: 8 10*3/uL (ref 4.0–10.5)

## 2018-03-08 LAB — LIPASE, BLOOD: Lipase: 29 U/L (ref 11–51)

## 2018-03-08 MED ORDER — KETOROLAC TROMETHAMINE 30 MG/ML IJ SOLN
15.0000 mg | Freq: Once | INTRAMUSCULAR | Status: AC
  Administered 2018-03-08: 15 mg via INTRAVENOUS
  Filled 2018-03-08: qty 1

## 2018-03-08 MED ORDER — SODIUM CHLORIDE 0.9 % IV BOLUS
1000.0000 mL | Freq: Once | INTRAVENOUS | Status: AC
  Administered 2018-03-08: 1000 mL via INTRAVENOUS

## 2018-03-08 MED ORDER — ONDANSETRON HCL 4 MG/2ML IJ SOLN
4.0000 mg | Freq: Once | INTRAMUSCULAR | Status: AC
  Administered 2018-03-08: 4 mg via INTRAVENOUS
  Filled 2018-03-08: qty 2

## 2018-03-08 MED ORDER — SODIUM CHLORIDE 0.9% FLUSH: 3.0000 mL | Freq: Once | INTRAVENOUS | Status: DC

## 2018-03-08 MED ORDER — ONDANSETRON 4 MG PO TBDP: ORAL_TABLET | ORAL | 0 refills | Status: DC

## 2018-03-08 NOTE — ED Triage Notes (Signed)
Pt complaint of n/v/d throughout the night; denies fever or other symptoms.

## 2018-03-08 NOTE — ED Provider Notes (Signed)
[facility] DEPT Provider Note   CSN: [id number] Arrival date & time: 03/08/18  0759    History   Chief Complaint Chief Complaint  Patient presents with  . Emesis  . Diarrhea    HPI [name] is a 73 y.o. female.     Pt complains of vomiting and diarrhea since last night.  Mild abdominal cramps.   No blood in vomit or diarrhea  The history is provided by the patient. No language interpreter was used.  Emesis  Severity:  Moderate Timing:  Constant Number of daily episodes:  4 Quality:  Bilious material Able to tolerate:  Liquids Progression:  Worsening Chronicity:  New Recent urination:  Normal Context: not post-tussive   Relieved by:  Nothing Worsened by:  Nothing Ineffective treatments:  None tried Associated symptoms: diarrhea   Associated symptoms: no abdominal pain, no cough and no headaches   Diarrhea  Associated symptoms: vomiting   Associated symptoms: no abdominal pain and no headaches     Past Medical History:  Diagnosis Date  . Anxiety   . Arthritis   . Carpal tunnel syndrome, bilateral 07/31/2015  . Cataract   . Cervical spondylosis without myelopathy   . Chronic chest pain   . Chronic kidney disease   . Chronic low back pain   . Chronotropic incompetence   . CKD (chronic kidney disease), stage III ([place])   . Coronary artery disease    a. inf-post MI 2011 s/p DES to RCA. b. DES to RCA 06/2010. c. DES to LAD 2014, residual dz treated medically.  . Depression   . Esophageal stricture   . GERD (gastroesophageal reflux disease)   . Gross hematuria   . History of esophageal dilatation    FOR STRIUCTURE  . History of gout   . History of hiatal hernia   . History of kidney stones   . Hyperlipidemia   . Hypertension   . Hypothyroidism   . Internal hemorrhoids   . Irritable bowel syndrome   . Mild aortic stenosis    by echo 2018  . Obesity   . Orthostatic hypotension   . Partial seizure disorder (HCC)  NEUROLOGIST-- DR WILLIS   NOCTURNAL  . PONV (postoperative nausea and vomiting)    hard time getting iv site-had to do neck stick 2 yrs ago  . Pulmonary fibrosis ([place])   . S/P pericardial cyst excision    02-05-2013  benign  . Seizures ([place])    none in yrs  . SVT (supraventricular tachycardia) ([place])   . Trigger finger, acquired 09/30/2015   Right middle finger  . Vitamin D deficiency     Patient Active Problem List   Diagnosis Date Noted  . DVT (deep venous thrombosis) ([place]) 08/19/2017  . Orthostatic hypotension 08/18/2017  . Diarrhea 08/18/2017  . Mild aortic stenosis   . Long-term current use of opiate analgesic 04/06/2017  . Chronic low back pain 04/06/2017  . Chronic neck pain 04/06/2017  . Chronic pain syndrome 04/06/2017  . Hyperphosphatemia 11/08/2016  . Trigger finger, acquired 09/30/2015  . Carpal tunnel syndrome, bilateral 07/31/2015  . Abnormal CAT scan 05/04/2015  . Breathlessness on exertion 05/04/2015  . Abnormal chest CT 05/04/2015  . Anemia in chronic kidney disease (CODE) 04/29/2015  . Bone disease, metabolic 41/93/7902  . Microscopic hematuria 04/29/2015  . Avitaminosis D 04/29/2015  . Normocytic anemia 03/01/2015  . Syncope due to orthostatic hypotension 03/01/2015  . Dehydration 03/01/2015  . Hypomagnesemia 03/01/2015  .  Generalized weakness 02/23/2015  . Hypokalemia 02/23/2015  . AKI (acute kidney injury) ([place]) 02/23/2015  . Acute bronchitis 02/23/2015  . Hypocalcemia 02/23/2015  . ILD (interstitial lung disease) ([place]) 01/24/2015  . Chronic respiratory failure ([place]) 12/26/2014  . Pulmonary fibrosis ([place]) 12/26/2014  . Dyspnea and respiratory abnormality 11/22/2014  . Abdominal pain 10/15/2014  . Pain in the chest   . RUQ abdominal pain   . Shortness of breath 10/14/2014  . Odynophagia   . History of esophageal stricture   . Esophageal dysmotility   . Renal insufficiency 02/14/2014  . Acute renal failure ([place]) 02/14/2014  . Nephrolithiasis  02/13/2014  . Gross hematuria 02/13/2014  . Convulsions/seizures ([place]) 07/26/2013  . Pericardial cyst   . Mediastinal mass 02/05/2013  . Cervical spondylosis 11/17/2011  . Chest pain 11/16/2011  . Hypertension   . Hyperlipidemia   . Hypothyroidism   . History of  seizures   . Depression   . Carpal tunnel syndrome   . Paroxysmal SVT (supraventricular tachycardia) ([place]) 01/02/2011  . Coronary atherosclerosis   . ANEMIA-UNSPECIFIED 12/12/2009  . History of drug coated stent 12/01/2009  . GERD 01/19/2008  . IBS 01/19/2008    Past Surgical History:  Procedure Laterality Date  . BIOPSY OF MEDIASTINAL MASS N/A 02/05/2013   Procedure: RESECTION OF MEDIASTINAL MASS;  Surgeon: Ivin Poot, MD;  Location: [facility];  Service: Thoracic;  Laterality: N/A;  . CARDIAC CATHETERIZATION Right 01/24/2015   Procedure: right femoral ARTERIAL LINE INSERTION;  Surgeon: Ivin Poot, MD;  Location: [facility];  Service: Thoracic;  Laterality: Right;  . CARDIOVASCULAR STRESS TEST  11-22-2013  dr Irish Lack   normal lexiscan study/  no ischemia/  normal LVF and wall motion/ ef 81%  . COLONOSCOPY  last one 2014  . CORONARY ANGIOGRAM  03/10/2011   Procedure: CORONARY ANGIOGRAM;  Surgeon: Jettie Booze, MD;  Location: [facility] CATH LAB;  Service: Cardiovascular;;  . CORONARY ANGIOPLASTY WITH STENT PLACEMENT  11-22-2009  dr Irish Lack   Acute inferoposterior MI/  ef 60%,  PCI with DES x1 to dRCA,  25%  mLAD  . CORONARY ANGIOPLASTY WITH STENT PLACEMENT  06-26-2010  dr Daneen Schick   Cutting balloon angioplasty to dRCA with DES x1,  40% mLAD (non-obstructive CAD)  . CYSTOSCOPY WITH RETROGRADE PYELOGRAM, URETEROSCOPY AND STENT PLACEMENT Right 02/13/2014   Procedure: CYSTOSCOPY WITH RETROGRADE PYELOGRAM, URETEROSCOPY AND STENT PLACEMENT;  Surgeon: Bernestine Amass, MD;  Location: [facility];  Service: Urology;  Laterality: Right;  . ECTOPIC PREGNANCY SURGERY  YRS AGO   SALPINGECTOMY  .  ESOPHAGOGASTRODUODENOSCOPY N/A 02/15/2014   Procedure: ESOPHAGOGASTRODUODENOSCOPY (EGD);  Surgeon: Jerene Bears, MD;  Location: [facility];  Service: Endoscopy;  Laterality: N/A;  . ESOPHAGOGASTRODUODENOSCOPY (EGD) WITH ESOPHAGEAL DILATION  05-20-2011  . HOLMIUM LASER APPLICATION Right 0/05/3974   Procedure: HOLMIUM LASER APPLICATION;  Surgeon: Bernestine Amass, MD;  Location: [facility];  Service: Urology;  Laterality: Right;  . LEFT HEART CATHETERIZATION WITH CORONARY ANGIOGRAM N/A 03/14/2012   Procedure: LEFT HEART CATHETERIZATION WITH CORONARY ANGIOGRAM;  Surgeon: Sueanne Margarita, MD;  Location: [facility] CATH LAB;  Service: Cardiovascular;  Laterality: N/A;  Normal LM,  50% pLAD,  70% mLAD,  D2 50-70%, very tortuous LAD,  70-82% ostial LCFX,  50% in-stent restenosis of  dRCA and mRCA  stent and 50-70% ostial PDA,  normal LVSF, ef 60%  . LUNG BIOPSY Right 01/24/2015   Procedure: RIGHT LUNG BIOPSY;  Surgeon: Tharon Aquas Trigt,  MD;  Location: [facility];  Service: Thoracic;  Laterality: Right;  . MEDIASTERNOTOMY N/A 02/05/2013   Procedure: MEDIAN STERNOTOMY;  Surgeon: Ivin Poot, MD;  Location: [facility];  Service: Thoracic;  Laterality: N/A;  . MEDIASTERNOTOMY Left 02/05/2013  . NEEDLE GUIDED EXCISION BREAST CALCIFICATIONS Right 08-09-2008  . PERCUTANEOUS CORONARY STENT INTERVENTION (PCI-S) N/A 04/03/2012   Procedure: PERCUTANEOUS CORONARY STENT INTERVENTION (PCI-S);  Surgeon: Jettie Booze, MD;  Location: [facility] CATH LAB;  Service: Cardiovascular;  Laterality: N/A;   Successful PCI  mLAD with 2.75x12 Promus stent, postdilated to >0.49mm  . THORACOTOMY Right 01/24/2015   Procedure: RIGHT MINI/LIMITED THORACOTOMY;  Surgeon: Ivin Poot, MD;  Location: [facility];  Service: Thoracic;  Laterality: Right;  . TRANSTHORACIC ECHOCARDIOGRAM  11-17-2011   grade I diastolic dysfunction/  ef 55-60%  . VIDEO BRONCHOSCOPY N/A 01/24/2015   Procedure: RIGHT VIDEO BRONCHOSCOPY;  Surgeon: Ivin Poot, MD;   Location: [facility] OR;  Service: Thoracic;  Laterality: N/A;     OB History   No obstetric history on file.      Home Medications    Prior to Admission medications   Medication Sig Start Date End Date Taking? Authorizing Provider  ALPRAZolam (XANAX) 0.25 MG tablet Take 0.25 mg by mouth 3 (three) times daily.    Yes [provider]  apixaban (ELIQUIS) 5 MG TABS tablet Take 1 tablet (5 mg total) by mouth 2 (two) times daily. 01/17/18  Yes Sueanne Margarita, MD  aspirin EC 81 MG tablet Take 81 mg by mouth daily.   Yes [provider]  calcitRIOL (ROCALTROL) 0.25 MCG capsule Take 0.25 mcg by mouth daily.    Yes [provider]  [employer] D3 50 MCG (2000 UT) CAPS Take 2,000 Units by mouth daily. 02/11/18  Yes [provider]  diltiazem (CARDIZEM CD) 120 MG 24 hr capsule Take 1 capsule (120 mg total) by mouth daily. 08/30/17  Yes Turner, Eber Hong, MD  DULoxetine (CYMBALTA) 60 MG capsule Take 60 mg by mouth daily. 01/28/18  Yes [provider]  ferrous sulfate 325 (65 FE) MG tablet Take 325 mg by mouth daily with breakfast.    Yes [provider]  levETIRAcetam (KEPPRA) 500 MG tablet TAKE 1 TABLET BY MOUTH TWICE A DAY Patient taking differently: Take 500 mg by mouth 2 (two) times daily.  07/04/17  Yes Ward Givens, NP  levothyroxine (SYNTHROID, LEVOTHROID) 112 MCG tablet Take 112 mcg by mouth daily. 06/25/15  Yes [provider]  magnesium oxide (MAG-OX) 400 MG tablet Take 400 mg by mouth 2 (two) times daily.  09/02/17  Yes [provider]  nebivolol (BYSTOLIC) 5 MG tablet Take half (1/2) tablet (2.5 mg) by mouth daily.   Yes [provider]  pantoprazole (PROTONIX) 40 MG tablet Take 1 tablet (40 mg total) by mouth 2 (two) times daily. Must keep upcoming office visit for further refills 03/07/18  Yes Irene Shipper, MD  potassium chloride SA (K-DUR,KLOR-CON) 20 MEQ tablet Take 1 tablet by mouth daily. 09/02/17  Yes [provider]    rosuvastatin (CRESTOR) 20 MG tablet Take 1 tablet (20 mg total) by mouth every morning. 11/14/17  Yes Turner, Eber Hong, MD  cholestyramine (QUESTRAN) 4 g packet Take 1 packet (4 g total) by mouth 2 (two) times daily. Patient not taking: Reported on 03/08/2018 08/20/17   Georgette Shell, MD  nitroGLYCERIN (NITROSTAT) 0.4 MG SL tablet PLACE 1 TABLET (0.4 MG TOTAL) UNDER THE TONGUE EVERY  5 (FIVE) MINUTES AS NEEDED FOR CHEST PAIN. Patient taking differently: Place 0.4 mg under the tongue every 5 (five) minutes as needed for chest pain. PLACE 1 TABLET (0.4 MG TOTAL) UNDER THE TONGUE EVERY 5 (FIVE) MINUTES AS NEEDED FOR CHEST PAIN. 02/14/17   Sueanne Margarita, MD  ondansetron (ZOFRAN ODT) 4 MG disintegrating tablet 4mg  ODT q4 hours prn nausea/vomit 03/08/18   Milton Ferguson, MD  saccharomyces boulardii (FLORASTOR) 250 MG capsule Take 1 capsule (250 mg total) by mouth 2 (two) times daily. Patient not taking: Reported on 03/08/2018 08/20/17   Georgette Shell, MD    Family History Family History  Problem Relation Age of Onset  . Breast cancer Sister   . Heart disease Father   . Hypertension Father   . Emphysema Father   . Coronary artery disease Father   . Diabetes Mellitus I Father   . Heart attack Mother   . CVA Mother   . Breast cancer Other        niece  . Colon cancer Neg Hx   . Stomach cancer Neg Hx   . Esophageal cancer Neg Hx   . Rectal cancer Neg Hx     Social History Social History   Tobacco Use  . Smoking status: Former Smoker    Packs/day: 0.30    Years: 3.00    Pack years: 0.90    Types: Cigarettes    Start date: 11/08/1964    Last attempt to quit: 11/09/1967    Years since quitting: 50.3  . Smokeless tobacco: Never Used  Substance Use Topics  . Alcohol use: No    Alcohol/week: 0.0 standard drinks  . Drug use: No     Allergies   Patient has no known allergies.   Review of Systems Review of Systems  Constitutional: Negative for appetite change and fatigue.   HENT: Negative for congestion, ear discharge and sinus pressure.   Eyes: Negative for discharge.  Respiratory: Negative for cough.   Cardiovascular: Negative for chest pain.  Gastrointestinal: Positive for diarrhea and vomiting. Negative for abdominal pain.  Genitourinary: Negative for frequency and hematuria.  Musculoskeletal: Negative for back pain.  Skin: Negative for rash.  Neurological: Negative for seizures and headaches.  Psychiatric/Behavioral: Negative for hallucinations.     Physical Exam Updated Vital Signs BP 132/81 (BP Location: Left Arm)   Pulse 80   Temp 97.7 F (36.5 C) (Oral)   Resp 16   Ht 4\' 11"  (1.499 m)   Wt 63.5 kg   SpO2 99%   BMI 28.28 kg/m   Physical Exam Vitals signs and nursing note reviewed.  Constitutional:      Appearance: She is well-developed.  HENT:     Head: Normocephalic.     Nose: Nose normal.  Eyes:     General: No scleral icterus.    Conjunctiva/sclera: Conjunctivae normal.  Neck:     Musculoskeletal: Neck supple.     Thyroid: No thyromegaly.  Cardiovascular:     Rate and Rhythm: Normal rate and regular rhythm.     Heart sounds: No murmur. No friction rub. No gallop.   Pulmonary:     Breath sounds: No stridor. No wheezing or rales.  Chest:     Chest wall: No tenderness.  Abdominal:     General: There is no distension.     Tenderness: There is abdominal tenderness. There is no rebound.  Musculoskeletal: Normal range of motion.  Lymphadenopathy:     Cervical: No cervical adenopathy.  Skin:    Findings: No erythema or rash.  Neurological:     Mental Status: She is oriented to person, place, and time.     Motor: No abnormal muscle tone.     Coordination: Coordination normal.  Psychiatric:        Behavior: Behavior normal.      ED Treatments / Results  Labs (all labs ordered are listed, but only abnormal results are displayed) Labs Reviewed  COMPREHENSIVE METABOLIC PANEL - Abnormal; Notable for the following  components:      Result Value   Glucose, Bld 122 (*)    Creatinine, Ser 1.25 (*)    Total Protein 8.3 (*)    AST 14 (*)    GFR calc non Af Amer 43 (*)    GFR calc Af Amer 50 (*)    All other components within normal limits  CBC - Abnormal; Notable for the following components:   Platelets 417 (*)    All other components within normal limits  LIPASE, BLOOD  DIFFERENTIAL  URINALYSIS, ROUTINE W REFLEX MICROSCOPIC    EKG None  Radiology No results found.  Procedures Procedures (including critical care time)  Medications Ordered in ED Medications  sodium chloride flush (NS) 0.9 % injection 3 mL (3 mLs Intravenous Not Given 03/08/18 0935)  sodium chloride 0.9 % bolus 1,000 mL (1,000 mLs Intravenous New Bag/Given 03/08/18 0915)  ondansetron (ZOFRAN) injection 4 mg (4 mg Intravenous Given 03/08/18 0916)  ketorolac (TORADOL) 30 MG/ML injection 15 mg (15 mg Intravenous Given 03/08/18 0915)  sodium chloride 0.9 % bolus 1,000 mL (1,000 mLs Intravenous New Bag/Given 03/08/18 1327)     Initial Impression / Assessment and Plan / ED Course  I have reviewed the triage vital signs and the nursing notes.  Pertinent labs & imaging results that were available during my care of the patient were reviewed by me and considered in my medical decision making (see chart for details).        Gastroenteritis.  Pt improved with tx .  Pt to follow up with her doctor if not improving.  She is given some Zofran  Final Clinical Impressions(s) / ED Diagnoses   Final diagnoses:  Dehydration    ED Discharge Orders         Ordered    ondansetron (ZOFRAN ODT) 4 MG disintegrating tablet     03/08/18 1513           Milton Ferguson, MD 03/08/18 [phone]

## 2018-03-08 NOTE — Discharge Instructions (Addendum)
Drink plenty of fluids take Tylenol for any aches or fever.  Follow-up with your doctor if not improving

## 2018-03-10 DIAGNOSIS — M25571 Pain in right ankle and joints of right foot: Secondary | ICD-10-CM | POA: Diagnosis not present

## 2018-03-16 ENCOUNTER — Ambulatory Visit: Payer: PPO | Admitting: Physician Assistant

## 2018-03-23 DIAGNOSIS — M25571 Pain in right ankle and joints of right foot: Secondary | ICD-10-CM | POA: Diagnosis not present

## 2018-03-29 DIAGNOSIS — M25562 Pain in left knee: Secondary | ICD-10-CM | POA: Diagnosis not present

## 2018-04-08 ENCOUNTER — Other Ambulatory Visit: Payer: Self-pay | Admitting: Internal Medicine

## 2018-04-13 ENCOUNTER — Other Ambulatory Visit: Payer: Self-pay | Admitting: *Deleted

## 2018-04-13 MED ORDER — PANTOPRAZOLE SODIUM 40 MG PO TBEC: 40.0000 mg | DELAYED_RELEASE_TABLET | Freq: Two times a day (BID) | ORAL | 0 refills | Status: DC

## 2018-04-17 DIAGNOSIS — S8261XD Displaced fracture of lateral malleolus of right fibula, subsequent encounter for closed fracture with routine healing: Secondary | ICD-10-CM | POA: Diagnosis not present

## 2018-04-17 DIAGNOSIS — S8261XA Displaced fracture of lateral malleolus of right fibula, initial encounter for closed fracture: Secondary | ICD-10-CM | POA: Diagnosis not present

## 2018-04-18 ENCOUNTER — Ambulatory Visit: Payer: PPO | Admitting: Internal Medicine

## 2018-04-20 DIAGNOSIS — M1712 Unilateral primary osteoarthritis, left knee: Secondary | ICD-10-CM | POA: Diagnosis not present

## 2018-04-20 DIAGNOSIS — R0689 Other abnormalities of breathing: Secondary | ICD-10-CM | POA: Diagnosis not present

## 2018-04-20 DIAGNOSIS — M25562 Pain in left knee: Secondary | ICD-10-CM | POA: Diagnosis not present

## 2018-04-20 DIAGNOSIS — J309 Allergic rhinitis, unspecified: Secondary | ICD-10-CM | POA: Diagnosis not present

## 2018-05-11 DIAGNOSIS — M1712 Unilateral primary osteoarthritis, left knee: Secondary | ICD-10-CM | POA: Diagnosis not present

## 2018-05-17 DIAGNOSIS — N183 Chronic kidney disease, stage 3 (moderate): Secondary | ICD-10-CM | POA: Diagnosis not present

## 2018-05-17 DIAGNOSIS — I129 Hypertensive chronic kidney disease with stage 1 through stage 4 chronic kidney disease, or unspecified chronic kidney disease: Secondary | ICD-10-CM | POA: Diagnosis not present

## 2018-05-20 ENCOUNTER — Other Ambulatory Visit: Payer: Self-pay | Admitting: Physician Assistant

## 2018-05-22 DIAGNOSIS — I2510 Atherosclerotic heart disease of native coronary artery without angina pectoris: Secondary | ICD-10-CM | POA: Diagnosis not present

## 2018-05-22 DIAGNOSIS — I10 Essential (primary) hypertension: Secondary | ICD-10-CM | POA: Diagnosis not present

## 2018-05-22 DIAGNOSIS — F331 Major depressive disorder, recurrent, moderate: Secondary | ICD-10-CM | POA: Diagnosis not present

## 2018-05-22 DIAGNOSIS — M25562 Pain in left knee: Secondary | ICD-10-CM | POA: Diagnosis not present

## 2018-05-22 DIAGNOSIS — N183 Chronic kidney disease, stage 3 (moderate): Secondary | ICD-10-CM | POA: Diagnosis not present

## 2018-05-22 DIAGNOSIS — E039 Hypothyroidism, unspecified: Secondary | ICD-10-CM | POA: Diagnosis not present

## 2018-05-22 NOTE — Telephone Encounter (Signed)
Pt calling requesting a refill on magnesium. Would Dr. Radford Pax like to refill this medication? Please address

## 2018-05-23 DIAGNOSIS — E559 Vitamin D deficiency, unspecified: Secondary | ICD-10-CM | POA: Diagnosis not present

## 2018-05-23 DIAGNOSIS — M9080 Osteopathy in diseases classified elsewhere, unspecified site: Secondary | ICD-10-CM | POA: Diagnosis not present

## 2018-05-23 DIAGNOSIS — N183 Chronic kidney disease, stage 3 (moderate): Secondary | ICD-10-CM | POA: Diagnosis not present

## 2018-05-23 DIAGNOSIS — E889 Metabolic disorder, unspecified: Secondary | ICD-10-CM | POA: Diagnosis not present

## 2018-05-23 DIAGNOSIS — I129 Hypertensive chronic kidney disease with stage 1 through stage 4 chronic kidney disease, or unspecified chronic kidney disease: Secondary | ICD-10-CM | POA: Diagnosis not present

## 2018-05-23 DIAGNOSIS — E876 Hypokalemia: Secondary | ICD-10-CM | POA: Diagnosis not present

## 2018-05-23 DIAGNOSIS — D631 Anemia in chronic kidney disease: Secondary | ICD-10-CM | POA: Diagnosis not present

## 2018-05-23 NOTE — Telephone Encounter (Signed)
PCP needs to order that

## 2018-05-24 NOTE — Telephone Encounter (Signed)
Spoke with the pt and her PMD already refilled her Mag Supplements.. Pt was still in bed and asks if someone can call her later today to make a 1 year OV with DR. Turner. She agrees to [employer] or [place] if late enough this summer. Will forward to the [facility] to call and set her up.          TELEPHONE CALL NOTE  This patient has been deemed a candidate for follow-up tele-health visit to limit community exposure during the Covid-19 pandemic. Pt will schedule an appointment with Dr. Radford Pax but agrees to Virtual if still in stages of no in person visits. The patient will receive a phone call 2-3 days prior to their E-Visit at which time consent will be verbally confirmed.   Stephani Police, RN 05/24/2018 8:56 AM

## 2018-05-29 DIAGNOSIS — M1712 Unilateral primary osteoarthritis, left knee: Secondary | ICD-10-CM | POA: Diagnosis not present

## 2018-05-31 ENCOUNTER — Telehealth: Payer: Self-pay

## 2018-05-31 NOTE — Telephone Encounter (Signed)
Patient screened over phone for televisit tomorrow

## 2018-06-01 ENCOUNTER — Other Ambulatory Visit: Payer: Self-pay

## 2018-06-01 ENCOUNTER — Ambulatory Visit (INDEPENDENT_AMBULATORY_CARE_PROVIDER_SITE_OTHER): Payer: PPO | Admitting: Internal Medicine

## 2018-06-01 ENCOUNTER — Encounter: Payer: Self-pay | Admitting: Internal Medicine

## 2018-06-01 VITALS — Ht 59.0 in | Wt 134.0 lb

## 2018-06-01 DIAGNOSIS — Z79891 Long term (current) use of opiate analgesic: Secondary | ICD-10-CM | POA: Diagnosis not present

## 2018-06-01 DIAGNOSIS — G894 Chronic pain syndrome: Secondary | ICD-10-CM | POA: Diagnosis not present

## 2018-06-01 DIAGNOSIS — R197 Diarrhea, unspecified: Secondary | ICD-10-CM | POA: Diagnosis not present

## 2018-06-01 DIAGNOSIS — M545 Low back pain: Secondary | ICD-10-CM | POA: Diagnosis not present

## 2018-06-01 DIAGNOSIS — K589 Irritable bowel syndrome without diarrhea: Secondary | ICD-10-CM | POA: Diagnosis not present

## 2018-06-01 DIAGNOSIS — K219 Gastro-esophageal reflux disease without esophagitis: Secondary | ICD-10-CM | POA: Diagnosis not present

## 2018-06-01 DIAGNOSIS — M542 Cervicalgia: Secondary | ICD-10-CM | POA: Diagnosis not present

## 2018-06-01 MED ORDER — PANTOPRAZOLE SODIUM 40 MG PO TBEC: 40.0000 mg | DELAYED_RELEASE_TABLET | Freq: Two times a day (BID) | ORAL | 3 refills | Status: DC

## 2018-06-01 NOTE — Patient Instructions (Addendum)
1.  Reflux precautions 2.  Continue pantoprazole.  REFILL FOR 1 YEAR.   3.  Discussed antispasmodic for transient pain.  She did not feel it was necessary given the infrequent stable short-lived nature.  I agree 4.  If diarrhea persists advised her to resume Questran once daily.  Titrate to need 5.  If problems persist or worsen features develop, then contact the office.   6.  Routine GI follow-up 1 year 7.  Repeat screening colonoscopy 2021

## 2018-06-01 NOTE — Addendum Note (Signed)
Addended by: Audrea Muscat on: 06/01/2018 01:56 PM   Modules accepted: Orders

## 2018-06-01 NOTE — Progress Notes (Signed)
HISTORY OF PRESENT ILLNESS:  [name] is a 73 y.o. female with multiple medical problems who presents today for routine GI follow-up regarding GERD and intermittent issues with diarrhea/IBS.  She schedules this visit the telemedicine during the coronavirus pandemic.  I last saw the patient in the office September 13, 2017.  At that time she was doing well after an acute diarrheal illness.  Was using Questran which she subsequently stopped.  She tells me that she was doing well until yesterday when she developed significant diarrhea.  No other symptoms such as nausea, vomiting, abdominal pain, fever, or bleeding.  There was some associated urgency.  No incontinence that she has had rare episodes in the past.  She also describes chronic stable intermittent right lower quadrant pain that occurs approximately 1 time per week generally lasts for a few minutes.  This has not worsened in any manner.  This is not affected by meals or bowel habits/defecation.  She did undergo abdominal ultrasound October 2016 which revealed gallbladder polyps.  Follow-up HIDA scan at that time was negative.  Last colonoscopy 2011 was normal including intubation of the terminal ileum on that right side.  Last upper endoscopy was January 2019 to evaluate epigastric and chest pain.  This was normal except for incidental benign gastric polyps.  Patient states that she continues on pantoprazole with no active reflux symptoms at this time.  REVIEW OF SYSTEMS:  All non-GI ROS negative unless otherwise stated in the HPI except for arthritis and anxiety  Past Medical History:  Diagnosis Date  . Anxiety   . Arthritis   . Carpal tunnel syndrome, bilateral 07/31/2015  . Cataract   . Cervical spondylosis without myelopathy   . Chronic chest pain   . Chronic kidney disease   . Chronic low back pain   . Chronotropic incompetence   . CKD (chronic kidney disease), stage III ([place])   . Coronary artery disease    a. inf-post MI 2011 s/p  DES to RCA. b. DES to RCA 06/2010. c. DES to LAD 2014, residual dz treated medically.  . Depression   . Esophageal stricture   . GERD (gastroesophageal reflux disease)   . Gross hematuria   . History of esophageal dilatation    FOR STRIUCTURE  . History of gout   . History of hiatal hernia   . History of kidney stones   . Hyperlipidemia   . Hypertension   . Hypothyroidism   . Internal hemorrhoids   . Irritable bowel syndrome   . Mild aortic stenosis    by echo 2018  . Obesity   . Orthostatic hypotension   . Partial seizure disorder (HCC) NEUROLOGIST-- DR WILLIS   NOCTURNAL  . PONV (postoperative nausea and vomiting)    hard time getting iv site-had to do neck stick 2 yrs ago  . Pulmonary fibrosis ([place])   . S/P pericardial cyst excision    02-05-2013  benign  . Seizures ([place])    none in yrs  . SVT (supraventricular tachycardia) ([place])   . Trigger finger, acquired 09/30/2015   Right middle finger  . Vitamin D deficiency     Past Surgical History:  Procedure Laterality Date  . BIOPSY OF MEDIASTINAL MASS N/A 02/05/2013   Procedure: RESECTION OF MEDIASTINAL MASS;  Surgeon: Ivin Poot, MD;  Location: [facility];  Service: Thoracic;  Laterality: N/A;  . CARDIAC CATHETERIZATION Right 01/24/2015   Procedure: right femoral ARTERIAL LINE INSERTION;  Surgeon: Ivin Poot, MD;  Location: [facility] OR;  Service: Thoracic;  Laterality: Right;  . CARDIOVASCULAR STRESS TEST  11-22-2013  dr Irish Lack   normal lexiscan study/  no ischemia/  normal LVF and wall motion/ ef 81%  . COLONOSCOPY  last one 2014  . CORONARY ANGIOGRAM  03/10/2011   Procedure: CORONARY ANGIOGRAM;  Surgeon: Jettie Booze, MD;  Location: [facility] CATH LAB;  Service: Cardiovascular;;  . CORONARY ANGIOPLASTY WITH STENT PLACEMENT  11-22-2009  dr Irish Lack   Acute inferoposterior MI/  ef 60%,  PCI with DES x1 to dRCA,  25%  mLAD  . CORONARY ANGIOPLASTY WITH STENT PLACEMENT  06-26-2010  dr Daneen Schick   Cutting balloon angioplasty  to dRCA with DES x1,  40% mLAD (non-obstructive CAD)  . CYSTOSCOPY WITH RETROGRADE PYELOGRAM, URETEROSCOPY AND STENT PLACEMENT Right 02/13/2014   Procedure: CYSTOSCOPY WITH RETROGRADE PYELOGRAM, URETEROSCOPY AND STENT PLACEMENT;  Surgeon: Bernestine Amass, MD;  Location: [facility];  Service: Urology;  Laterality: Right;  . ECTOPIC PREGNANCY SURGERY  YRS AGO   SALPINGECTOMY  . ESOPHAGOGASTRODUODENOSCOPY N/A 02/15/2014   Procedure: ESOPHAGOGASTRODUODENOSCOPY (EGD);  Surgeon: Jerene Bears, MD;  Location: [facility];  Service: Endoscopy;  Laterality: N/A;  . ESOPHAGOGASTRODUODENOSCOPY (EGD) WITH ESOPHAGEAL DILATION  05-20-2011  . HOLMIUM LASER APPLICATION Right 03/14/1960   Procedure: HOLMIUM LASER APPLICATION;  Surgeon: Bernestine Amass, MD;  Location[facility];  Service: Urology;  Laterality: Right;  . LEFT HEART CATHETERIZATION WITH CORONARY ANGIOGRAM N/A 03/14/2012   Procedure: LEFT HEART CATHETERIZATION WITH CORONARY ANGIOGRAM;  Surgeon: Sueanne Margarita, MD;  Location: [facility] CATH LAB;  Service: Cardiovascular;  Laterality: N/A;  Normal LM,  50% pLAD,  70% mLAD,  D2 50-70%, very tortuous LAD,  70-82% ostial LCFX,  50% in-stent restenosis of  dRCA and mRCA  stent and 50-70% ostial PDA,  normal LVSF, ef 60%  . LUNG BIOPSY Right 01/24/2015   Procedure: RIGHT LUNG BIOPSY;  Surgeon: Ivin Poot, MD;  Location: [facility];  Service: Thoracic;  Laterality: Right;  . MEDIASTERNOTOMY N/A 02/05/2013   Procedure: MEDIAN STERNOTOMY;  Surgeon: Ivin Poot, MD;  Location: [facility];  Service: Thoracic;  Laterality: N/A;  . MEDIASTERNOTOMY Left 02/05/2013  . NEEDLE GUIDED EXCISION BREAST CALCIFICATIONS Right 08-09-2008  . PERCUTANEOUS CORONARY STENT INTERVENTION (PCI-S) N/A 04/03/2012   Procedure: PERCUTANEOUS CORONARY STENT INTERVENTION (PCI-S);  Surgeon: Jettie Booze, MD;  Location: [facility] CATH LAB;  Service: Cardiovascular;  Laterality: N/A;   Successful PCI  mLAD with 2.75x12 Promus  stent, postdilated to >0.56mm  . THORACOTOMY Right 01/24/2015   Procedure: RIGHT MINI/LIMITED THORACOTOMY;  Surgeon: Ivin Poot, MD;  Location: [facility];  Service: Thoracic;  Laterality: Right;  . TRANSTHORACIC ECHOCARDIOGRAM  11-17-2011   grade I diastolic dysfunction/  ef 55-60%  . VIDEO BRONCHOSCOPY N/A 01/24/2015   Procedure: RIGHT VIDEO BRONCHOSCOPY;  Surgeon: Ivin Poot, MD;  Location: [facility] OR;  Service: Thoracic;  Laterality: N/A;    Social History Jenafer LYNETTE TOPETE  reports that she quit smoking about 50 years ago. Her smoking use included cigarettes. She started smoking about 53 years ago. She has a 0.90 pack-year smoking history. She has never used smokeless tobacco. She reports that she does not drink alcohol or use drugs.  family history includes Breast cancer in her sister and another family member; CVA in her mother; Coronary artery disease in her father; Diabetes Mellitus I in her father; Emphysema in her father; Heart attack in her mother; Heart disease  in her father; Hypertension in her father.  No Known Allergies     PHYSICAL EXAMINATION: No physical examination with telehealth visit   ASSESSMENT:  1.  Recurrence of diarrhea for 1 day.  Likely related to her irritable bowel.  No worrisome features by history. 2.  Irritable bowel syndrome 3.  GERD 4.  Colon cancer screening.  Up-to-date 5.  Multiple significant medical problems 6.  Fleeting right-sided pain.  Chronic.  Possibly spasm or musculoskeletal  PLAN:  1.  Reflux precautions 2.  Continue pantoprazole.  REFILL FOR 1 YEAR.  Medication risks reviewed 3.  Discussed antispasmodic for transient pain.  She did not feel it was necessary given the infrequent stable short-lived nature.  I agree 4.  If diarrhea persists advised her to resume Questran once daily.  Titrate to need 5.  If problems persist or worsen features develop, then contact the office.  She agrees 6.  Routine GI follow-up 1 year 7.  Repeat  screening colonoscopy 2021  This telehealth medicine visit was initiated by the patient and consented for by the patient who who was in her home while I was in my office during the encounter.  She understands her may be an associated professional charge for this service

## 2018-06-07 DIAGNOSIS — D649 Anemia, unspecified: Secondary | ICD-10-CM | POA: Diagnosis not present

## 2018-06-07 DIAGNOSIS — F329 Major depressive disorder, single episode, unspecified: Secondary | ICD-10-CM | POA: Diagnosis not present

## 2018-06-07 DIAGNOSIS — I10 Essential (primary) hypertension: Secondary | ICD-10-CM | POA: Diagnosis not present

## 2018-06-07 DIAGNOSIS — D508 Other iron deficiency anemias: Secondary | ICD-10-CM | POA: Diagnosis not present

## 2018-06-07 DIAGNOSIS — I2510 Atherosclerotic heart disease of native coronary artery without angina pectoris: Secondary | ICD-10-CM | POA: Diagnosis not present

## 2018-06-07 DIAGNOSIS — N183 Chronic kidney disease, stage 3 (moderate): Secondary | ICD-10-CM | POA: Diagnosis not present

## 2018-06-07 DIAGNOSIS — E039 Hypothyroidism, unspecified: Secondary | ICD-10-CM | POA: Diagnosis not present

## 2018-06-07 DIAGNOSIS — F324 Major depressive disorder, single episode, in partial remission: Secondary | ICD-10-CM | POA: Diagnosis not present

## 2018-06-07 DIAGNOSIS — F331 Major depressive disorder, recurrent, moderate: Secondary | ICD-10-CM | POA: Diagnosis not present

## 2018-06-07 DIAGNOSIS — I25119 Atherosclerotic heart disease of native coronary artery with unspecified angina pectoris: Secondary | ICD-10-CM | POA: Diagnosis not present

## 2018-06-22 ENCOUNTER — Telehealth: Payer: Self-pay | Admitting: Cardiology

## 2018-06-22 NOTE — Telephone Encounter (Signed)
New message     Called both numbers to schedule July visit with Dr Radford Pax; no answer and no vm.

## 2018-07-08 ENCOUNTER — Other Ambulatory Visit: Payer: Self-pay | Admitting: Cardiology

## 2018-07-16 ENCOUNTER — Other Ambulatory Visit: Payer: Self-pay | Admitting: Adult Health

## 2018-08-19 ENCOUNTER — Other Ambulatory Visit: Payer: Self-pay | Admitting: Cardiology

## 2018-08-21 ENCOUNTER — Other Ambulatory Visit: Payer: Self-pay

## 2018-08-21 MED ORDER — DILTIAZEM HCL ER COATED BEADS 120 MG PO CP24: 120.0000 mg | ORAL_CAPSULE | Freq: Every day | ORAL | 0 refills | Status: DC

## 2018-09-04 ENCOUNTER — Other Ambulatory Visit: Payer: Self-pay | Admitting: *Deleted

## 2018-09-04 ENCOUNTER — Telehealth: Payer: Self-pay | Admitting: *Deleted

## 2018-09-04 NOTE — Telephone Encounter (Signed)

## 2018-09-06 ENCOUNTER — Other Ambulatory Visit: Payer: Self-pay

## 2018-09-06 ENCOUNTER — Telehealth (INDEPENDENT_AMBULATORY_CARE_PROVIDER_SITE_OTHER): Payer: PPO | Admitting: Cardiology

## 2018-09-06 ENCOUNTER — Encounter: Payer: Self-pay | Admitting: Cardiology

## 2018-09-06 DIAGNOSIS — R079 Chest pain, unspecified: Secondary | ICD-10-CM

## 2018-09-06 DIAGNOSIS — I350 Nonrheumatic aortic (valve) stenosis: Secondary | ICD-10-CM | POA: Diagnosis not present

## 2018-09-06 DIAGNOSIS — Q248 Other specified congenital malformations of heart: Secondary | ICD-10-CM

## 2018-09-06 DIAGNOSIS — I2510 Atherosclerotic heart disease of native coronary artery without angina pectoris: Secondary | ICD-10-CM | POA: Diagnosis not present

## 2018-09-06 DIAGNOSIS — E7800 Pure hypercholesterolemia, unspecified: Secondary | ICD-10-CM

## 2018-09-06 DIAGNOSIS — I951 Orthostatic hypotension: Secondary | ICD-10-CM | POA: Diagnosis not present

## 2018-09-06 DIAGNOSIS — I10 Essential (primary) hypertension: Secondary | ICD-10-CM

## 2018-09-06 NOTE — Progress Notes (Signed)
Virtual Visit via Telephone Note   This visit type was conducted due to national recommendations for restrictions regarding the COVID-19 Pandemic (e.g. social distancing) in an effort to limit this patient's exposure and mitigate transmission in our community.  Due to her co-morbid illnesses, this patient is at least at moderate risk for complications without adequate follow up.  This format is felt to be most appropriate for this patient at this time.  All issues noted in this document were discussed and addressed.  A limited physical exam was performed with this format.  Please refer to the patient's chart for her consent to telehealth for [facility].  Evaluation Performed:  Follow-up visit  This visit type was conducted due to national recommendations for restrictions regarding the COVID-19 Pandemic (e.g. social distancing).  This format is felt to be most appropriate for this patient at this time.  All issues noted in this document were discussed and addressed.  No physical exam was performed (except for noted visual exam findings with Video Visits).  Please refer to the patient's chart (MyChart message for video visits and phone note for telephone visits) for the patient's consent to telehealth for [facility].  Date:  09/06/2018   ID:  [name], DOB 1945-09-25, MRN [id number]  Patient Location:  Home  Provider location:   [place]  PCP:  Carol Ada, MD  Cardiologist:  Fransico Him, MD Electrophysiologist:  None   Chief Complaint:  CAD, HTN, HLD  History of Present Illness:    [name] is a 73 y.o. female who presents via audio/video conferencing for a telehealth visit today.    [name] is a 73 y.o. female with a hx of CAD/HTN, pericardial cyst s/p resection and dyslipidemia. She has a history of chronic CP with esophageal strictures and has had esophageal dilatation.She underwent VATS procedure via mini thoracotomy syndrome with open lung bx and  was diagnosed with interstitial lung disease. She has chronic CP that has been felt to be noncardiac in the past and has started to reoccur. She has chronic DOE.   She underwent a cardiopulmonary stress test by Pulmonary to evaluate her SOB and it showed deconditioning and poor HR response to exercise. At the request of the pulmonologist her CCB and BB were stoppedas they though her SOB was due to chronotropic incompetence.She then started having palpitations and was seen byher PCP,Dr. [name] was told to take bystolic PRN for palpitations. She then saw Dr. [name] who put her back on Cardizem and dropped her amlodipine to 56m daily.A nuclear stress test was done in January 2018 which showed no ischemia. This was done for similar chest pain that she had in the past. This chest pain occurred around the time she lost her dog.    I saw her in early August last year and she was complaining of CP which improved after increasing her nitrate to 1245mdaily.  She was under a lot of stress taking care of her husband who has depression.  I recommended she see her PCP to discuss treatment of her anxiety and likely depression but she did not pursue this.  At that time I reassured her that CP she was having intermittently was likely related to stress but ordered a repeat stress test which she later called and cancelled. She also had had an episode of syncope related to decreased PO intake and stopper her amlodipine, Lasix and doxazosin but continued her on Losartan 5036maily, Cardizem CD 240m59mily  and Bystolic 46m daily.    She was seen by LKerin Ransom PA a week later and was still having decreased PO intake with black diarrhea and was significantly orthostatic on exam. She was weak and dizzy.  [facility] be [facility]  She was given IVF and her Bystolic was stopped and Cardizem decreased.  She was seen by GI for chronic diarrhea.  She was also dx with acute  left peroneal vein DVT and started on Eliquis.   Her husband called in on 8/15 with complaints that her dBP was elevated and she was instructed to restart her Bystolic 24.4WNdaily and change to Cardizem CD 1244mdaily.    She saw ViRobbie LisPA on 09/01/2017 and had not started her Bystolic or Cardizem back and BP was elevated at 164/9071m.  She was also complaining of bleeding gums and her Plavix was stopped and she was continued on ASA (also on DOAC for DVT).  She was instructed to restart Bystolic and Cardizem and only use Lasix PRN for edema.    She is here today for followup and is doing well.  She says that once her diarrhea stopped she has felt better.  She has been having more CP that she describes as a pressure in the mid sternal part of her chest.  She also had been breaking out in sweats with minimal exertion.  She denies any  SOB, DOE, PND, orthopnea, LE edema, dizziness, palpitations or syncope. She is compliant with her meds and is tolerating meds with no SE.    The patient does not have symptoms concerning for COVID-19 infection (fever, chills, cough, or new shortness of breath).   Prior CV studies:   The following studies were reviewed today:  none  Past Medical History:  Diagnosis Date  . Anxiety   . Arthritis   . Carpal tunnel syndrome, bilateral 07/31/2015  . Cataract   . Cervical spondylosis without myelopathy   . Chronic chest pain   . Chronic kidney disease   . Chronic low back pain   . Chronotropic incompetence   . CKD (chronic kidney disease), stage III ([place] . Coronary artery disease    a. inf-post MI 2011 s/p DES to RCA. b. DES to RCA 06/2010. c. DES to LAD 2014, residual dz treated medically.  . Depression   . Esophageal stricture   . GERD (gastroesophageal reflux disease)   . Gross hematuria   . History of esophageal dilatation    FOR STRIUCTURE  . History of gout   . History of hiatal hernia   . History of kidney stones   . Hyperlipidemia   .  Hypertension   . Hypothyroidism   . Internal hemorrhoids   . Irritable bowel syndrome   . Mild aortic stenosis    by echo 2018  . Obesity   . Orthostatic hypotension   . Partial seizure disorder (HCC) NEUROLOGIST-- DR WILLIS   NOCTURNAL  . PONV (postoperative nausea and vomiting)    hard time getting iv site-had to do neck stick 2 yrs ago  . Pulmonary fibrosis ([place] . S/P pericardial cyst excision    02-05-2013  benign  . Seiz[place] ([place]  none in yrs  . SVT (supraventricular tachycardia) ([place] . Trigger finger, acquired 09/30/2015   Right middle finger  . Vitamin D deficiency    Past Surgical History:  Procedure Laterality Date  . BIOPSY  OF MEDIASTINAL MASS N/A 02/05/2013   Procedure: RESECTION OF MEDIASTINAL MASS;  Surgeon: Ivin Poot, MD;  Location: [place];  Service: Thoracic;  Laterality: N/A;  . CARDIAC CATHETERIZATION Right 01/24/2015   Procedure: right femoral ARTERIAL LINE INSERTION;  Surgeon: Ivin Poot, MD;  Location: [place];  Service: Thoracic;  Laterality: Right;  . CARDIOVASCULAR STRESS TEST  11-22-2013  dr Irish Lack   normal lexiscan study/  no ischemia/  normal LVF and wall motion/ ef 81%  . COLONOSCOPY  last one 2014  . CORONARY ANGIOGRAM  03/10/2011   Procedure: CORONARY ANGIOGRAM;  Surgeon: Jettie Booze, MD;  Location: [facility] CATH LAB;  Service: Cardiovascular;;  . CORONARY ANGIOPLASTY WITH STENT PLACEMENT  11-22-2009  dr Irish Lack   Acute inferoposterior MI/  ef 60%,  PCI with DES x1 to dRCA,  25%  mLAD  . CORONARY ANGIOPLASTY WITH STENT PLACEMENT  06-26-2010  dr Daneen Schick   Cutting balloon angioplasty to dRCA with DES x1,  40% mLAD (non-obstructive CAD)  . CYSTOSCOPY WITH RETROGRADE PYELOGRAM, URETEROSCOPY AND STENT PLACEMENT Right 02/13/2014   Procedure: CYSTOSCOPY WITH RETROGRADE PYELOGRAM, URETEROSCOPY AND STENT PLACEMENT;  Surgeon: Bernestine Amass, MD;  Location: [facility];  Service: Urology;  Laterality: Right;  . ECTOPIC  PREGNANCY SURGERY  YRS AGO   SALPINGECTOMY  . ESOPHAGOGASTRODUODENOSCOPY N/A 02/15/2014   Procedure: ESOPHAGOGASTRODUODENOSCOPY (EGD);  Surgeon: Jerene Bears, MD;  Location: [facility];  Service: Endoscopy;  Laterality: N/A;  . ESOPHAGOGASTRODUODENOSCOPY (EGD) WITH ESOPHAGEAL DILATION  05-20-2011  . HOLMIUM LASER APPLICATION Right 01/16/1094   Procedure: HOLMIUM LASER APPLICATION;  Surgeon: Bernestine Amass, MD;  Location[facility]  Service: Urology;  Laterality: Right;  . LEFT HEART CATHETERIZATION WITH CORONARY ANGIOGRAM N/A 03/14/2012   Procedure: LEFT HEART CATHETERIZATION WITH CORONARY ANGIOGRAM;  Surgeon: Sueanne Margarita, MD;  Location: [place] CATH LAB;  Service: Cardiovascular;  Laterality: N/A;  Normal LM,  50% pLAD,  70% mLAD,  D2 50-70%, very tortuous LAD,  70-82% ostial LCFX,  50% in-stent restenosis of  dRCA and mRCA  stent and 50-70% ostial PDA,  normal LVSF, ef 60%  . LUNG BIOPSY Right 01/24/2015   Procedure: RIGHT LUNG BIOPSY;  Surgeon: Ivin Poot, MD;  Location: [place];  Service: Thoracic;  Laterality: Right;  . MEDIASTERNOTOMY N/A 02/05/2013   Procedure: MEDIAN STERNOTOMY;  Surgeon: Ivin Poot, MD;  Location: [place];  Service: Thoracic;  Laterality: N/A;  . MEDIASTERNOTOMY Left 02/05/2013  . NEEDLE GUIDED EXCISION BREAST CALCIFICATIONS Right 08-09-2008  . PERCUTANEOUS CORONARY STENT INTERVENTION (PCI-S) N/A 04/03/2012   Procedure: PERCUTANEOUS CORONARY STENT INTERVENTION (PCI-S);  Surgeon: Jettie Booze, MD;  Location: [facility] CATH LAB;  Service: Cardiovascular;  Laterality: N/A;   Successful PCI  mLAD with 2.75x12 Promus stent, postdilated to >0.46m  . THORACOTOMY Right 01/24/2015   Procedure: RIGHT MINI/LIMITED THORACOTOMY;  Surgeon: PIvin Poot MD;  [place]  Service: Thoracic;  Laterality: Right;  . TRANSTHORACIC ECHOCARDIOGRAM  11-17-2011   grade I diastolic dysfunction/  ef 55-60%  . VIDEO BRONCHOSCOPY N/A 01/24/2015   Procedure: RIGHT VIDEO  BRONCHOSCOPY;  Surgeon: PIvin Poot MD;  [facility] [facility]  Service: Thoracic;  Laterality: N/A;     Current Meds  Medication Sig  . ALPRAZolam (XANAX) 0.25 MG tablet Take 0.25 mg by mouth 3 (three) times daily.   .Marland Kitchenapixaban (ELIQUIS) 5 MG TABS tablet Take 1 tablet (5 mg total) by mouth 2 (two) times  daily.  . aspirin EC 81 MG tablet Take 81 mg by mouth daily.  . calcitRIOL (ROCALTROL) 0.25 MCG capsule Take 0.25 mcg by mouth daily.   . CVS D3 50 MCG (2000 UT) CAPS Take 2,000 Units by mouth daily.  Marland Kitchen diltiazem (CARDIZEM CD) 120 MG 24 hr capsule Take 1 capsule (120 mg total) by mouth daily.  . DULoxetine (CYMBALTA) 60 MG capsule Take 60 mg by mouth daily.  . ferrous sulfate 325 (65 FE) MG tablet Take 325 mg by mouth daily with breakfast.   . HYDROcodone-acetaminophen (NORCO) 10-325 MG tablet Take by mouth.  . levETIRAcetam (KEPPRA) 500 MG tablet TAKE 1 TABLET BY MOUTH TWICE A DAY  . levothyroxine (SYNTHROID, LEVOTHROID) 112 MCG tablet Take 112 mcg by mouth daily.  . magnesium oxide (MAG-OX) 400 MG tablet TAKE 1 TABLET BY MOUTH TWICE A DAY  . naloxone (NARCAN) 4 MG/0.1ML LIQD nasal spray kit Narcan 4 mg/actuation nasal spray  . nebivolol (BYSTOLIC) 5 MG tablet Take 2.5 mg by mouth daily.  . nitroGLYCERIN (NITROSTAT) 0.4 MG SL tablet PLACE 1 TABLET (0.4 MG TOTAL) UNDER THE TONGUE EVERY 5 (FIVE) MINUTES AS NEEDED FOR CHEST PAIN. (Patient taking differently: Place 0.4 mg under the tongue every 5 (five) minutes as needed for chest pain. PLACE 1 TABLET (0.4 MG TOTAL) UNDER THE TONGUE EVERY 5 (FIVE) MINUTES AS NEEDED FOR CHEST PAIN.)  . pantoprazole (PROTONIX) 40 MG tablet Take 1 tablet (40 mg total) by mouth 2 (two) times daily.  . potassium chloride SA (K-DUR,KLOR-CON) 20 MEQ tablet Take 1 tablet by mouth daily.  . rosuvastatin (CRESTOR) 20 MG tablet Take 1 tablet (20 mg total) by mouth every morning.     Allergies:   Patient has no known allergies.   Social History   Tobacco Use  .  Smoking status: Former Smoker    Packs/day: 0.30    Years: 3.00    Pack years: 0.90    Types: Cigarettes    Start date: 11/08/1964    Quit date: 11/09/1967    Years since quitting: 50.8  . Smokeless tobacco: Never Used  Substance Use Topics  . Alcohol use: No    Alcohol/week: 0.0 standard drinks  . Drug use: No     Family Hx: The patient's family history includes Breast cancer in her sister and another family member; CVA in her mother; Coronary artery disease in her father; Diabetes Mellitus I in her father; Emphysema in her father; Heart attack in her mother; Heart disease in her father; Hypertension in her father. There is no history of Colon cancer, Stomach cancer, Esophageal cancer, or Rectal cancer.  ROS:   Please see the history of present illness.     All other systems reviewed and are negative.   Labs/Other Tests and Data Reviewed:    Recent Labs: 03/08/2018: ALT 8; BUN 16; Creatinine, Ser 1.25; Hemoglobin 13.5; Platelets 417; Potassium 3.5; Sodium 139   Recent Lipid Panel Lab Results  Component Value Date/Time   CHOL 147 11/03/2017 08:59 AM   TRIG 106 11/03/2017 08:59 AM   HDL 71 11/03/2017 08:59 AM   CHOLHDL 2.1 11/03/2017 08:59 AM   CHOLHDL 1.8 06/06/2015 09:28 AM   LDLCALC 55 11/03/2017 08:59 AM    Wt Readings from Last 3 Encounters:  09/06/18 145 lb (65.8 kg)  06/01/18 134 lb (60.8 kg)  03/08/18 140 lb (63.5 kg)     Objective:    Vital Signs:  BP (!) 148/102   Pulse 74  Ht '4\' 11"'  (1.499 m)   Wt 145 lb (65.8 kg)   BMI 29.29 kg/m      ASSESSMENT & PLAN:    1.  ASCAD - s/p inf-post MI 2011 s/p DES to RCA.Recurrent CP s/pDES to RCA 6/2012and thenDES to LAD 2014withresidual dz treated medically.  She has a long hx of chronic noncardiac CP related to esophageal spasm and stricture.  Her last stress test was showed no ishemia on 02/04/2016.  She now tells me that she is having a different type of CP that she describes as a pressure mid sternal and  sometimes will break out in a profuse sweat when exerting herself. She says that these sx are new and not like her chronic noncardiac CP.   I will get a [place] myoveiw to rule out ischemia. She will continue on aspirin 81 mg daily, beta-blocker and statin.  Her Plavix was stopped due to problems with bleeding gums.   2.  HTN - BP is elevated today.  Most of the time her BP is in the 120/70's.  Occasionally it will jump up higher like today.  She has not taken her BP meds this am.  Repeat after initial BP today was 149/71mHg. Her husband says that she has been eating a lot of potato chips.  I have instructed her to not eat more than 2gm Na daily.    She will continue on Cardizem CD 120 mg daily and Bystolic 2.5 mg daily.  3.  Mild AS -very mild by echo 2018 with mean gradient 11 mmHg.  I will repeat a 2D echo to make sure this is stable.  4.  Orthostatic hypotension -this is been fairly stable although she still has problems with dizziness . She has not had any further syncope after her episode of dehydration.   5.  SVT -she has had no recurrence of palpitations.  She will continue on Cardizem and Bystolic.  6.  DVT -continue Eliquis per PCP.  Creatinine is stable at 1.25.   7.  Hyperlipidemia with LDL goal less than 70.  Her LDL was 55 10/2017.  She will continue Crestor 20 mg daily.  I will get an FLP and ALT at time of stress test.  COVID-19 Education: The signs and symptoms of COVID-19 were discussed with the patient and how to seek care for testing (follow up with PCP or arrange E-visit).  The importance of social distancing was discussed today.  Patient Risk:   After full review of this patient's clinical status, I feel that they are at least moderate risk at this time.  Time:   Today, I have spent 20 minutes directly with the patient on telemedicine discussing medical problems including CAD, HTN, AS, SVT, DVT, HLD.  We also reviewed the symptoms of COVID 19 and the ways to protect  against contracting the virus with telehealth technology.  I spent an additional 5 minutes reviewing patient's chart including labs.  Medication Adjustments/Labs and Tests Ordered: Current medicines are reviewed at length with the patient today.  Concerns regarding medicines are outlined above.  Tests Ordered: No orders of the defined types were placed in this encounter.  Medication Changes: No orders of the defined types were placed in this encounter.   Disposition:  Follow up in 6 month(s) with PA and 1 year with me  Signed, TFransico Him MD  09/06/2018 8:57 AM    [facility]

## 2018-09-06 NOTE — Patient Instructions (Signed)
Medication Instructions:  Your physician recommends that you continue on your current medications as directed. Please refer to the Current Medication list given to you today.  If you need a refill on your cardiac medications before your next appointment, please call your pharmacy.   Lab work: Your physician recommends that you return for a FASTING lipid profile and ALT the same day as your stress test   If you have labs (blood work) drawn today and your tests are completely normal, you will receive your results only by: Marland Kitchen MyChart Message (if you have MyChart) OR . A paper copy in the mail If you have any lab test that is abnormal or we need to change your treatment, we will call you to review the results.  Testing/Procedures: Your physician has requested that you have an echocardiogram. Echocardiography is a painless test that uses sound waves to create images of your heart. It provides your doctor with information about the size and shape of your heart and how well your heart's chambers and valves are working. This procedure takes approximately one hour. There are no restrictions for this procedure.  Your physician has requested that you have a lexiscan myoview. For further information please visit [email or web address]. Please follow instruction sheet, as given.    Follow-Up: Follow up with APP in 6 months  Follow up with Dr. Radford Pax in 12 months   Any Other Special Instructions Will Be Listed Below (If Applicable).

## 2018-09-11 ENCOUNTER — Telehealth (HOSPITAL_COMMUNITY): Payer: Self-pay | Admitting: *Deleted

## 2018-09-11 NOTE — Telephone Encounter (Signed)
Patient given detailed instructions per Myocardial Perfusion Study Information Sheet for the test on 09/13/18. Patient notified to arrive 15 minutes early and that it is imperative to arrive on time for appointment to keep from having the test rescheduled.  If you need to cancel or reschedule your appointment, please call the office within 24 hours of your appointment. . Patient verbalized understanding. Kirstie Peri

## 2018-09-13 ENCOUNTER — Other Ambulatory Visit: Payer: Self-pay

## 2018-09-13 ENCOUNTER — Ambulatory Visit (HOSPITAL_COMMUNITY): Payer: PPO | Attending: Cardiovascular Disease

## 2018-09-13 ENCOUNTER — Telehealth: Payer: Self-pay

## 2018-09-13 ENCOUNTER — Ambulatory Visit (HOSPITAL_BASED_OUTPATIENT_CLINIC_OR_DEPARTMENT_OTHER): Payer: PPO

## 2018-09-13 ENCOUNTER — Other Ambulatory Visit: Payer: PPO | Admitting: *Deleted

## 2018-09-13 DIAGNOSIS — I350 Nonrheumatic aortic (valve) stenosis: Secondary | ICD-10-CM | POA: Insufficient documentation

## 2018-09-13 DIAGNOSIS — R079 Chest pain, unspecified: Secondary | ICD-10-CM | POA: Diagnosis not present

## 2018-09-13 DIAGNOSIS — E7800 Pure hypercholesterolemia, unspecified: Secondary | ICD-10-CM | POA: Diagnosis not present

## 2018-09-13 LAB — LIPID PANEL
Chol/HDL Ratio: 2.3 ratio (ref 0.0–4.4)
Cholesterol, Total: 160 mg/dL (ref 100–199)
HDL: 70 mg/dL (ref >39)
LDL Chol Calc (NIH): 73 mg/dL (ref 0–99)
Triglycerides: 94 mg/dL (ref 0–149)
VLDL Cholesterol Cal: 17 mg/dL (ref 5–40)

## 2018-09-13 LAB — MYOCARDIAL PERFUSION IMAGING
LV dias vol: 45 mL (ref 46–106)
LV sys vol: 14 mL
Peak HR: 98 {beats}/min
Rest HR: 63 {beats}/min
SDS: 1
SRS: 1
SSS: 2
TID: 1.15

## 2018-09-13 LAB — ECHOCARDIOGRAM COMPLETE
Height: 59 in
Weight: 2320 oz

## 2018-09-13 LAB — ALT: ALT: 10 IU/L (ref 0–32)

## 2018-09-13 MED ORDER — REGADENOSON 0.4 MG/5ML IV SOLN
0.4000 mg | Freq: Once | INTRAVENOUS | Status: AC
  Administered 2018-09-13: 0.4 mg via INTRAVENOUS

## 2018-09-13 MED ORDER — TECHNETIUM TC 99M TETROFOSMIN IV KIT
31.6000 | PACK | Freq: Once | INTRAVENOUS | Status: AC | PRN
  Administered 2018-09-13: 31.6 via INTRAVENOUS
  Filled 2018-09-13: qty 32

## 2018-09-13 MED ORDER — TECHNETIUM TC 99M TETROFOSMIN IV KIT
10.4000 | PACK | Freq: Once | INTRAVENOUS | Status: AC | PRN
  Administered 2018-09-13: 10.4 via INTRAVENOUS
  Filled 2018-09-13: qty 11

## 2018-09-13 NOTE — Telephone Encounter (Signed)
-----   Message from Sueanne Margarita, MD sent at 09/13/2018  4:15 PM EDT ----- Echo showed normal LVF , trivial pericardial effusion, mildly thickened and calcified MV, mild AS - stable echo from prior

## 2018-09-13 NOTE — Telephone Encounter (Signed)
Notes recorded by Frederik Schmidt, RN on 09/13/2018 at 4:53 PM EDT  The patient has been notified of the result and verbalized understanding. All questions (if any) were answered.  Frederik Schmidt, RN 09/13/2018 4:53 PM

## 2018-09-14 ENCOUNTER — Telehealth: Payer: Self-pay

## 2018-09-14 NOTE — Telephone Encounter (Signed)
The patient has been notified of the result and verbalized understanding.  All questions (if any) were answered. Wilma Flavin, RN 09/14/2018 9:13 AM

## 2018-09-19 DIAGNOSIS — Z23 Encounter for immunization: Secondary | ICD-10-CM | POA: Diagnosis not present

## 2018-09-19 DIAGNOSIS — I10 Essential (primary) hypertension: Secondary | ICD-10-CM | POA: Diagnosis not present

## 2018-09-19 DIAGNOSIS — F324 Major depressive disorder, single episode, in partial remission: Secondary | ICD-10-CM | POA: Diagnosis not present

## 2018-09-19 DIAGNOSIS — Z0000 Encounter for general adult medical examination without abnormal findings: Secondary | ICD-10-CM | POA: Diagnosis not present

## 2018-09-19 DIAGNOSIS — M109 Gout, unspecified: Secondary | ICD-10-CM | POA: Diagnosis not present

## 2018-09-19 DIAGNOSIS — I82492 Acute embolism and thrombosis of other specified deep vein of left lower extremity: Secondary | ICD-10-CM | POA: Diagnosis not present

## 2018-09-19 DIAGNOSIS — E7800 Pure hypercholesterolemia, unspecified: Secondary | ICD-10-CM | POA: Diagnosis not present

## 2018-09-19 DIAGNOSIS — N183 Chronic kidney disease, stage 3 (moderate): Secondary | ICD-10-CM | POA: Diagnosis not present

## 2018-09-19 DIAGNOSIS — Z1389 Encounter for screening for other disorder: Secondary | ICD-10-CM | POA: Diagnosis not present

## 2018-09-19 DIAGNOSIS — E039 Hypothyroidism, unspecified: Secondary | ICD-10-CM | POA: Diagnosis not present

## 2018-09-19 DIAGNOSIS — K219 Gastro-esophageal reflux disease without esophagitis: Secondary | ICD-10-CM | POA: Diagnosis not present

## 2018-09-19 DIAGNOSIS — I2510 Atherosclerotic heart disease of native coronary artery without angina pectoris: Secondary | ICD-10-CM | POA: Diagnosis not present

## 2018-09-20 DIAGNOSIS — N183 Chronic kidney disease, stage 3 (moderate): Secondary | ICD-10-CM | POA: Diagnosis not present

## 2018-09-25 DIAGNOSIS — R8281 Pyuria: Secondary | ICD-10-CM | POA: Diagnosis not present

## 2018-09-25 DIAGNOSIS — E559 Vitamin D deficiency, unspecified: Secondary | ICD-10-CM | POA: Diagnosis not present

## 2018-09-25 DIAGNOSIS — I129 Hypertensive chronic kidney disease with stage 1 through stage 4 chronic kidney disease, or unspecified chronic kidney disease: Secondary | ICD-10-CM | POA: Diagnosis not present

## 2018-09-25 DIAGNOSIS — D631 Anemia in chronic kidney disease: Secondary | ICD-10-CM | POA: Diagnosis not present

## 2018-09-25 DIAGNOSIS — R3129 Other microscopic hematuria: Secondary | ICD-10-CM | POA: Diagnosis not present

## 2018-09-25 DIAGNOSIS — N183 Chronic kidney disease, stage 3 (moderate): Secondary | ICD-10-CM | POA: Diagnosis not present

## 2018-09-25 DIAGNOSIS — N200 Calculus of kidney: Secondary | ICD-10-CM | POA: Diagnosis not present

## 2018-09-25 DIAGNOSIS — E889 Metabolic disorder, unspecified: Secondary | ICD-10-CM | POA: Diagnosis not present

## 2018-09-25 DIAGNOSIS — M9080 Osteopathy in diseases classified elsewhere, unspecified site: Secondary | ICD-10-CM | POA: Diagnosis not present

## 2018-10-03 ENCOUNTER — Ambulatory Visit: Payer: PPO | Admitting: Adult Health

## 2018-10-12 ENCOUNTER — Other Ambulatory Visit: Payer: Self-pay | Admitting: Cardiology

## 2018-10-12 NOTE — Telephone Encounter (Signed)
Prescription refill request for Eliquis received.  Last office visit: [place] (11-03-2017) Scr: 1.25 (03-08-2018) Age: 73 y.o. Weight: 60.8 kg  Prescription refill sent.

## 2018-10-26 ENCOUNTER — Ambulatory Visit: Payer: PPO | Admitting: Cardiology

## 2018-10-30 ENCOUNTER — Ambulatory Visit: Payer: PPO | Admitting: Neurology

## 2018-10-30 NOTE — Progress Notes (Signed)
PATIENT: [name] DOB: 1945-05-19  REASON FOR VISIT: follow up HISTORY FROM: patient  HISTORY OF PRESENT ILLNESS: Today 10/31/18  [name] is a 73 year old female with history of seizures.  She remains on Keppra 500 mg twice a day.  She reports it has been years since her last seizure.  She does question if she may have had 1 seizure since last seen, but she is not sure if it was an actual seizure or just a dream.  Her seizures occur nocturnally.  She describes the episode a couple months ago that she thinks the right side of her face "drew up" and she was unable to move her right arm to touch her face.  She says this has been her typical seizure in the past.  However, this particular time, she is not able to discern if it was a dream or not.  She does not have any urinary incontinence or oral injury with her seizures.  She reports chronic tremor to her jaw.  She maintains routine follow-up with her primary care doctor.  She is followed by a kidney specialist.  She does not drive much, she lives with her husband.  She is able to ambulate with assistance, she reports she has not had any falls.  She presents today for follow-up unaccompanied.  She was under the impression she was going to see Dr. Jannifer Franklin today.  HISTORY 09/29/2017 MM: [name] is a 74 year old female with a history of seizures.  She returns today for follow-up.  She remains on Keppra 500 mg twice a day.  She denies any seizure events.  Denies any changes in her mood or behavior.  No change in her gait or balance.  She reports that she recently had a fall.  She states that she tripped when going to the bathroom and hit the bathroom tub.  She has bruising on her right thigh and below the right eye.  She returns today for evaluation.  REVIEW OF SYSTEMS: Out of a complete 14 system review of symptoms, the patient complains only of the following symptoms, and all other reviewed systems are negative.  Excessive sweating,  hearing loss, loss of vision, shortness of breath, chest pain, murmur, insomnia, snoring, joint pain, back pain, moles, headache, weakness, facial drooping, depression  ALLERGIES: No Known Allergies  HOME MEDICATIONS: Outpatient Medications Prior to Visit  Medication Sig Dispense Refill   ALPRAZolam (XANAX) 0.25 MG tablet Take 0.25 mg by mouth 3 (three) times daily.      aspirin EC 81 MG tablet Take 81 mg by mouth daily.     calcitRIOL (ROCALTROL) 0.25 MCG capsule Take 0.25 mcg by mouth daily.      [employer] D3 50 MCG (2000 UT) CAPS Take 2,000 Units by mouth daily.     diltiazem (CARDIZEM CD) 120 MG 24 hr capsule Take 1 capsule (120 mg total) by mouth daily. 90 capsule 0   DULoxetine (CYMBALTA) 60 MG capsule Take 60 mg by mouth daily.     ferrous sulfate 325 (65 FE) MG tablet Take 325 mg by mouth daily with breakfast.      HYDROcodone-acetaminophen (NORCO) 10-325 MG tablet Take by mouth.     levETIRAcetam (KEPPRA) 500 MG tablet TAKE 1 TABLET BY MOUTH TWICE A DAY 180 tablet 4   levothyroxine (SYNTHROID, LEVOTHROID) 112 MCG tablet Take 112 mcg by mouth daily.  5   magnesium oxide (MAG-OX) 400 MG tablet TAKE 1 TABLET BY MOUTH TWICE A DAY 60 tablet  6   naloxone (NARCAN) 4 MG/0.1ML LIQD nasal spray kit Narcan 4 mg/actuation nasal spray     nebivolol (BYSTOLIC) 5 MG tablet Take 1.5 mg by mouth daily.      nitroGLYCERIN (NITROSTAT) 0.4 MG SL tablet PLACE 1 TABLET (0.4 MG TOTAL) UNDER THE TONGUE EVERY 5 (FIVE) MINUTES AS NEEDED FOR CHEST PAIN. (Patient taking differently: Place 0.4 mg under the tongue every 5 (five) minutes as needed for chest pain. PLACE 1 TABLET (0.4 MG TOTAL) UNDER THE TONGUE EVERY 5 (FIVE) MINUTES AS NEEDED FOR CHEST PAIN.) 75 tablet 1   pantoprazole (PROTONIX) 40 MG tablet Take 1 tablet (40 mg total) by mouth 2 (two) times daily. 90 tablet 3   potassium chloride SA (K-DUR,KLOR-CON) 20 MEQ tablet Take 1 tablet by mouth daily.     rosuvastatin (CRESTOR) 20 MG tablet  Take 1 tablet (20 mg total) by mouth every morning. 90 tablet 3   ELIQUIS 5 MG TABS tablet TAKE 1 TABLET BY MOUTH TWICE A DAY (Patient not taking: Reported on 10/31/2018) 180 tablet 1   No facility-administered medications prior to visit.     PAST MEDICAL HISTORY: Past Medical History:  Diagnosis Date   Anxiety    Arthritis    Carpal tunnel syndrome, bilateral 07/31/2015   Cataract    Cervical spondylosis without myelopathy    Chronic chest pain    Chronic kidney disease    Chronic low back pain    Chronotropic incompetence    CKD (chronic kidney disease), stage III    Coronary artery disease    a. inf-post MI 2011 s/p DES to RCA. b. DES to RCA 06/2010. c. DES to LAD 2014, residual dz treated medically.   Depression    Esophageal stricture    GERD (gastroesophageal reflux disease)    Gross hematuria    History of esophageal dilatation    FOR STRIUCTURE   History of gout    History of hiatal hernia    History of kidney stones    Hyperlipidemia    Hypertension    Hypothyroidism    Internal hemorrhoids    Irritable bowel syndrome    Mild aortic stenosis    by echo 2018   Obesity    Orthostatic hypotension    Partial seizure disorder (HCC) NEUROLOGIST-- DR Jannifer Franklin   NOCTURNAL   PONV (postoperative nausea and vomiting)    hard time getting iv site-had to do neck stick 2 yrs ago   Pulmonary fibrosis ([place])    S/P pericardial cyst excision    02-05-2013  benign   Seizures ([place])    none in yrs   SVT (supraventricular tachycardia) (HCC)    Trigger finger, acquired 09/30/2015   Right middle finger   Vitamin D deficiency     PAST SURGICAL HISTORY: Past Surgical History:  Procedure Laterality Date   BIOPSY OF MEDIASTINAL MASS N/A 02/05/2013   Procedure: RESECTION OF MEDIASTINAL MASS;  Surgeon: Ivin Poot, MD;  Location: [facility];  Service: Thoracic;  Laterality: N/A;   CARDIAC CATHETERIZATION Right 01/24/2015   Procedure: right femoral  ARTERIAL LINE INSERTION;  Surgeon: Ivin Poot, MD;  Location: [facility];  Service: Thoracic;  Laterality: Right;   CARDIOVASCULAR STRESS TEST  11-22-2013  dr Irish Lack   normal lexiscan study/  no ischemia/  normal LVF and wall motion/ ef 81%   COLONOSCOPY  last one 2014   CORONARY ANGIOGRAM  03/10/2011   Procedure: CORONARY ANGIOGRAM;  Surgeon: Jettie Booze, MD;  Location: [facility];  Service: Cardiovascular;;   CORONARY ANGIOPLASTY WITH STENT PLACEMENT  11-22-2009  dr Irish Lack   Acute inferoposterior MI/  ef 60%,  PCI with DES x1 to dRCA,  25%  mLAD   CORONARY ANGIOPLASTY WITH STENT PLACEMENT  06-26-2010  dr Daneen Schick   Cutting balloon angioplasty to dRCA with DES x1,  40% mLAD (non-obstructive CAD)   CYSTOSCOPY WITH RETROGRADE PYELOGRAM, URETEROSCOPY AND STENT PLACEMENT Right 02/13/2014   Procedure: CYSTOSCOPY WITH RETROGRADE PYELOGRAM, URETEROSCOPY AND STENT PLACEMENT;  Surgeon: Bernestine Amass, MD;  Location: [facility];  Service: Urology;  Laterality: Right;   ECTOPIC PREGNANCY SURGERY  YRS AGO   SALPINGECTOMY   ESOPHAGOGASTRODUODENOSCOPY N/A 02/15/2014   Procedure: ESOPHAGOGASTRODUODENOSCOPY (EGD);  Surgeon: Jerene Bears, MD;  Location: [facility];  Service: Endoscopy;  Laterality: N/A;   ESOPHAGOGASTRODUODENOSCOPY (EGD) WITH ESOPHAGEAL DILATION  05-20-2011   HOLMIUM LASER APPLICATION Right 03/16/5730   Procedure: HOLMIUM LASER APPLICATION;  Surgeon: Bernestine Amass, MD;  Location: [facility];  Service: Urology;  Laterality: Right;   LEFT HEART CATHETERIZATION WITH CORONARY ANGIOGRAM N/A 03/14/2012   Procedure: LEFT HEART CATHETERIZATION WITH CORONARY ANGIOGRAM;  Surgeon: Sueanne Margarita, MD;  Location: [facility] CATH [facility];  Service: Cardiovascular;  Laterality: N/A;  Normal LM,  50% pLAD,  70% mLAD,  D2 50-70%, very tortuous LAD,  70-82% ostial LCFX,  50% in-stent restenosis of  dRCA and mRCA  stent and 50-70% ostial PDA,  normal LVSF, ef 60%   LUNG  BIOPSY Right 01/24/2015   Procedure: RIGHT LUNG BIOPSY;  Surgeon: Ivin Poot, MD;  Location: [facility];  Service: Thoracic;  Laterality: Right;   MEDIASTERNOTOMY N/A 02/05/2013   Procedure: MEDIAN STERNOTOMY;  Surgeon: Ivin Poot, MD;  Location: [facility];  Service: Thoracic;  Laterality: N/A;   MEDIASTERNOTOMY Left 02/05/2013   NEEDLE GUIDED EXCISION BREAST CALCIFICATIONS Right 08-09-2008   PERCUTANEOUS CORONARY STENT INTERVENTION (PCI-S) N/A 04/03/2012   Procedure: PERCUTANEOUS CORONARY STENT INTERVENTION (PCI-S);  Surgeon: Jettie Booze, MD;  Location: [facility];  Service: Cardiovascular;  Laterality: N/A;   Successful PCI  mLAD with 2.75x12 Promus stent, postdilated to >0.62m   THORACOTOMY Right 01/24/2015   Procedure: RIGHT MINI/LIMITED THORACOTOMY;  Surgeon: PIvin Poot MD;  [facility];  Service: Thoracic;  Laterality: Right;   TRANSTHORACIC ECHOCARDIOGRAM  11-17-2011   grade I diastolic dysfunction/  ef 55-60%   VIDEO BRONCHOSCOPY N/A 01/24/2015   Procedure: RIGHT VIDEO BRONCHOSCOPY;  Surgeon: PIvin Poot MD;  [facility];  Service: Thoracic;  Laterality: N/A;    FAMILY HISTORY: Family History  Problem Relation Age of Onset   Breast cancer Sister    Heart disease Father    Hypertension Father    Emphysema Father    Coronary artery disease Father    Diabetes Mellitus I Father    Heart attack Mother    CVA Mother    Breast cancer Other        niece   Colon cancer Neg Hx    Stomach cancer Neg Hx    Esophageal cancer Neg Hx    Rectal cancer Neg Hx     SOCIAL HISTORY: Social History   Socioeconomic History   Marital status: Married    Spouse name: LFritz Pickerel  Number of children: 0   Years of education: hs   Highest education level: Not on file  Occupational History   Occupation: Retired    [employer] farm manager RETIRED  Social Designer, fashion/clothing strain: Not on file   Food insecurity    Worry: Not on file    Inability: Not  on file   Transportation needs    Medical: Not on file    Non-medical: Not on file  Tobacco Use   Smoking status: Former Smoker    Packs/day: 0.30    Years: 3.00    Pack years: 0.90    Types: Cigarettes    Start date: 11/08/1964    Quit date: 11/09/1967    Years since quitting: 51.0   Smokeless tobacco: Never Used  Substance and Sexual Activity   Alcohol use: No    Alcohol/week: 0.0 standard drinks   Drug use: No   Sexual activity: Not on file  Lifestyle   Physical activity    Days per week: Not on file    Minutes per session: Not on file   Stress: Not on file  Relationships   Social connections    Talks on phone: Not on file    Gets together: Not on file    Attends religious service: Not on file    Active member of club or organization: Not on file    Attends meetings of clubs or organizations: Not on file    Relationship status: Not on file   Intimate partner violence    Fear of current or ex partner: Not on file    Emotionally abused: Not on file    Physically abused: Not on file    Forced sexual activity: Not on file  Other Topics Concern   Not on file  Social History Narrative   Lives at home, married   Left-handed   Daily caffeine use: coffee.    PHYSICAL EXAM  Vitals:   10/31/18 0939  BP: 124/76  Pulse: 66  Temp: 98.1 F (36.7 C)  TempSrc: Oral  SpO2: 99%  Weight: 141 lb 6.4 oz (64.1 kg)  Height: '4\' 11"'  (1.499 m)   Body mass index is 28.56 kg/m.  Generalized: Well developed, in no acute distress   Neurological examination  Mentation: Alert oriented to time, place, history taking. Follows all commands speech and language fluent Cranial nerve II-XII: Pupils were equal round reactive to light. Extraocular movements were full, visual field were full on confrontational test. Facial sensation and strength were normal. Head turning and shoulder shrug  were normal and symmetric. Motor: The motor testing reveals 5 over 5 strength of all 4  extremities. Good symmetric motor tone is noted throughout. Mild tremor to jaw noted. Sensory: Sensory testing is intact to soft touch on all 4 extremities. No evidence of extinction is noted.  Coordination: Cerebellar testing reveals good finger-nose-finger and heel-to-shin bilaterally.  Gait and station: Gait is normal. Tandem gait is mildly unsteady.  Romberg is negative. No drift is seen.  Reflexes: Deep tendon reflexes are symmetric and normal bilaterally.   DIAGNOSTIC DATA (LABS, IMAGING, TESTING) - I reviewed patient records, labs, notes, testing and imaging myself where available.  Lab Results  Component Value Date   WBC 8.0 03/08/2018   HGB 13.5 03/08/2018   HCT 44.0 03/08/2018   MCV 91.7 03/08/2018   PLT 417 (H) 03/08/2018      Component Value Date/Time   NA 139 03/08/2018 0858   NA 143 03/04/2016 1135   K 3.5 03/08/2018 0858   CL 104 03/08/2018 0858   CO2 24 03/08/2018 0858   GLUCOSE 122 (H) 03/08/2018 0858   BUN 16 03/08/2018 0858  BUN 18 03/04/2016 1135   CREATININE 1.25 (H) 03/08/2018 0858   CREATININE 1.12 (H) 06/06/2015 0928   CALCIUM 8.9 03/08/2018 0858   PROT 8.3 (H) 03/08/2018 0858   PROT 6.8 11/03/2017 0859   ALBUMIN 3.8 03/08/2018 0858   ALBUMIN 4.0 11/03/2017 0859   AST 14 (L) 03/08/2018 0858   ALT 10 09/13/2018 0956   ALKPHOS 92 03/08/2018 0858   BILITOT 0.8 03/08/2018 0858   BILITOT 0.4 11/03/2017 0859   GFRNONAA 43 (L) 03/08/2018 0858   GFRAA 50 (L) 03/08/2018 0858   Lab Results  Component Value Date   CHOL 160 09/13/2018   HDL 70 09/13/2018   LDLCALC 73 09/13/2018   TRIG 94 09/13/2018   CHOLHDL 2.3 09/13/2018   Lab Results  Component Value Date   HGBA1C 6.5 (H) 10/15/2014   Lab Results  Component Value Date   VITAMINB12 281 01/30/2010   Lab Results  Component Value Date   TSH 1.063 08/18/2017      ASSESSMENT AND PLAN 73 y.o. year old female  has a past medical history of Anxiety, Arthritis, Carpal tunnel syndrome, bilateral  (07/31/2015), Cataract, Cervical spondylosis without myelopathy, Chronic chest pain, Chronic kidney disease, Chronic low back pain, Chronotropic incompetence, CKD (chronic kidney disease), stage III, Coronary artery disease, Depression, Esophageal stricture, GERD (gastroesophageal reflux disease), Gross hematuria, History of esophageal dilatation, History of gout, History of hiatal hernia, History of kidney stones, Hyperlipidemia, Hypertension, Hypothyroidism, Internal hemorrhoids, Irritable bowel syndrome, Mild aortic stenosis, Obesity, Orthostatic hypotension, Partial seizure disorder ([place]) (NEUROLOGIST-- DR Jannifer Franklin), PONV (postoperative nausea and vomiting), Pulmonary fibrosis ([place]), S/P pericardial cyst excision, Seizures ([place]), SVT (supraventricular tachycardia) ([place]), Trigger finger, acquired (09/30/2015), and Vitamin D deficiency. here with:  1.  Seizures  She remains on Keppra 500 mg twice a day for seizures.  She is unsure if she may have had 1 seizure event since last seen.  She describes the event as her typical seizure, but she is unsure if it was a true seizure or just a dream.  Her seizures occur nocturnally.  It has been several years since her last true seizure event.  We discussed increasing the Keppra dosing, she is unwilling to adjust her dosing at this time.  She will closely monitor her symptoms, if she has recurrent seizures she will let me know.  In the future, she wishes to see Dr. Jannifer Franklin instead of Nurse practitioners if at all possible.  Her Keppra was refilled for 1 year in July 2020.  She will follow-up in 1 year or sooner if needed.  I spent 15 minutes with the patient. 50% of this time was spent discussing her plan of care.  Butler Denmark, AGNP-C, DNP 10/31/2018, 10:20 AM [facility] [address] [place], [place] [address] [phone]

## 2018-10-31 ENCOUNTER — Encounter: Payer: Self-pay | Admitting: Neurology

## 2018-10-31 ENCOUNTER — Ambulatory Visit: Payer: PPO | Admitting: Neurology

## 2018-10-31 ENCOUNTER — Other Ambulatory Visit: Payer: Self-pay

## 2018-10-31 VITALS — BP 124/76 | HR 66 | Temp 98.1°F | Ht 59.0 in | Wt 141.4 lb

## 2018-10-31 DIAGNOSIS — R569 Unspecified convulsions: Secondary | ICD-10-CM | POA: Diagnosis not present

## 2018-10-31 NOTE — Progress Notes (Signed)
I have read the note, and I agree with the clinical assessment and plan.  Charles K Willis   

## 2018-10-31 NOTE — Patient Instructions (Signed)
1. Continue Keppra

## 2018-11-05 ENCOUNTER — Other Ambulatory Visit: Payer: Self-pay | Admitting: Cardiology

## 2018-11-13 ENCOUNTER — Other Ambulatory Visit: Payer: Self-pay | Admitting: Cardiology

## 2018-12-13 ENCOUNTER — Other Ambulatory Visit: Payer: Self-pay | Admitting: Cardiology

## 2019-01-15 DIAGNOSIS — E1165 Type 2 diabetes mellitus with hyperglycemia: Secondary | ICD-10-CM | POA: Diagnosis not present

## 2019-01-15 DIAGNOSIS — E039 Hypothyroidism, unspecified: Secondary | ICD-10-CM | POA: Diagnosis not present

## 2019-01-19 ENCOUNTER — Telehealth: Payer: Self-pay | Admitting: Internal Medicine

## 2019-01-19 NOTE — Telephone Encounter (Signed)
Pt states her husband saw Dr. Henrene Pastor a few weeks ago and told her to call the office and he would see her. Pt states she has been having abdominal pain and trouble swallowing. Pt scheduled to see Dr. Henrene Pastor 02/01/19@8 :20am. Pt aware of appt.

## 2019-01-22 DIAGNOSIS — E1165 Type 2 diabetes mellitus with hyperglycemia: Secondary | ICD-10-CM | POA: Diagnosis not present

## 2019-01-22 DIAGNOSIS — E039 Hypothyroidism, unspecified: Secondary | ICD-10-CM | POA: Diagnosis not present

## 2019-01-22 DIAGNOSIS — N189 Chronic kidney disease, unspecified: Secondary | ICD-10-CM | POA: Diagnosis not present

## 2019-01-22 DIAGNOSIS — I10 Essential (primary) hypertension: Secondary | ICD-10-CM | POA: Diagnosis not present

## 2019-01-22 DIAGNOSIS — E200 Idiopathic hypoparathyroidism: Secondary | ICD-10-CM | POA: Diagnosis not present

## 2019-01-24 DIAGNOSIS — L720 Epidermal cyst: Secondary | ICD-10-CM | POA: Diagnosis not present

## 2019-01-25 ENCOUNTER — Encounter (INDEPENDENT_AMBULATORY_CARE_PROVIDER_SITE_OTHER): Payer: Self-pay

## 2019-01-25 ENCOUNTER — Other Ambulatory Visit: Payer: Self-pay | Admitting: Internal Medicine

## 2019-01-25 ENCOUNTER — Other Ambulatory Visit: Payer: Self-pay

## 2019-01-25 ENCOUNTER — Encounter: Payer: Self-pay | Admitting: Cardiology

## 2019-01-25 ENCOUNTER — Ambulatory Visit: Payer: PPO | Admitting: Cardiology

## 2019-01-25 VITALS — BP 136/78 | HR 69 | Ht 59.0 in | Wt 144.2 lb

## 2019-01-25 DIAGNOSIS — I350 Nonrheumatic aortic (valve) stenosis: Secondary | ICD-10-CM | POA: Diagnosis not present

## 2019-01-25 DIAGNOSIS — I2510 Atherosclerotic heart disease of native coronary artery without angina pectoris: Secondary | ICD-10-CM | POA: Diagnosis not present

## 2019-01-25 DIAGNOSIS — I10 Essential (primary) hypertension: Secondary | ICD-10-CM

## 2019-01-25 DIAGNOSIS — I129 Hypertensive chronic kidney disease with stage 1 through stage 4 chronic kidney disease, or unspecified chronic kidney disease: Secondary | ICD-10-CM | POA: Diagnosis not present

## 2019-01-25 DIAGNOSIS — Q248 Other specified congenital malformations of heart: Secondary | ICD-10-CM | POA: Diagnosis not present

## 2019-01-25 DIAGNOSIS — I951 Orthostatic hypotension: Secondary | ICD-10-CM

## 2019-01-25 DIAGNOSIS — I82409 Acute embolism and thrombosis of unspecified deep veins of unspecified lower extremity: Secondary | ICD-10-CM | POA: Diagnosis not present

## 2019-01-25 DIAGNOSIS — E7800 Pure hypercholesterolemia, unspecified: Secondary | ICD-10-CM

## 2019-01-25 DIAGNOSIS — I471 Supraventricular tachycardia: Secondary | ICD-10-CM

## 2019-01-25 DIAGNOSIS — N183 Chronic kidney disease, stage 3 unspecified: Secondary | ICD-10-CM | POA: Diagnosis not present

## 2019-01-25 NOTE — Progress Notes (Signed)
Cardiology Office Note:    Date:  01/25/2019   ID:  [name], DOB [birth year], MRN [id number]  PCP:  Carol Ada, MD  Cardiologist:  Fransico Him, MD    Referring MD: Carol Ada, MD   Chief Complaint  Patient presents with  . Coronary Artery Disease  . Hypertension  . Aortic Stenosis  . Hyperlipidemia    History of Present Illness:    [name] is a 74 y.o. female with a hx of CAD/HTN, pericardial cyst s/p resection and dyslipidemia. She has a history of chronic CP with esophageal strictures and has had esophageal dilatation.She underwent VATS procedure via mini thoracotomy syndrome with open lung bx and was diagnosed with interstitial lung disease. She has chronic CP that has been felt to be noncardiac in the past and has started to reoccur. She has chronic DOE.   She underwent a cardiopulmonary stress test by Pulmonary to evaluate her SOB and it showed deconditioning and poor HR response to exercise. At the request of the pulmonologist her CCB and BB were stoppedas they though her SOB was due to chronotropic incompetence.She then started having palpitations and was seen byher PCP,Dr. [name] was told to take bystolic PRN for palpitations. She then saw Dr. [name] who put her back on Cardizem and dropped her amlodipine to 5mg  daily.A nuclear stress test was done in January 2018 which showed no ischemia. This was done for similar chest pain that she had in the past. This chest pain occurred around the time she lost her dog.  I saw her in early August last year and she was complaining of CP which improved after increasing her nitrate to 120mg  daily. She was under a lot of stress taking care of her husband who has depression. I recommended she see her PCP to discuss treatment of her anxiety and likely depression but she did not pursue this. At that time I reassured her that CP she was having intermittently was likely related to stress but ordered a  repeat stress test which she later called and cancelled. She also had had an episode of syncope related to decreased PO intake and stopped her amlodipine, Lasix and doxazosin but continued her on Losartan 50mg  daily, Cardizem CD 240mg  daily and Bystolic 5mg  daily.   She was seen by [name], PA a week later and was still having decreased PO intake with black diarrhea and was significantly orthostatic on exam. She was weak and dizzy. She was found to be anemic and was sent to [facility] for evaluation. She was given IVF and her Bystolic was stopped and Cardizem decreased. She was seen by GI for chronic diarrhea. She was also dx with acute left peroneal vein DVT and started on Eliquis. Her husband called in on 8/15 with complaints that her dBP was elevated and she was instructed to restart her Bystolic 2.5mg  daily and change to Cardizem CD 120mg  daily.   She saw [name], PA on 09/01/2017 and had not started her Bystolic or Cardizem back and BP was elevated at 164/78mmHg. She was also complaining of bleeding gums and her Plavix was stopped and she was continued on ASA (also on DOAC for DVT). She was instructed to restart Bystolic and Cardizem and only use Lasix PRN for edema.   I saw her for televisit in August and she was still complaining of chest pain.  lexiscan myoview was done and was normal.    She is here today for followup and is doing well.  She denies any PND, orthopnea,  dizziness, palpitations or syncope. She continues to have chronic non cardiac CP and is now spitting up her pills and has an appt to see GI next week to see if she has a recurrent esophageal stricture.  She has chronic DOE which is stable and chronic intermittent LE edema that is stable. She is compliant with her meds and is tolerating meds with no SE.     Past Medical History:  Diagnosis Date  . Anxiety   . Arthritis   . Carpal tunnel syndrome, bilateral 07/31/2015  . Cataract   . Cervical spondylosis without  myelopathy   . Chronic chest pain   . Chronic kidney disease   . Chronic low back pain   . Chronotropic incompetence   . CKD (chronic kidney disease), stage III   . Coronary artery disease    a. inf-post MI 2011 s/p DES to RCA. b. DES to RCA 06/2010. c. DES to LAD 2014, residual dz treated medically.  . Depression   . Esophageal stricture   . GERD (gastroesophageal reflux disease)   . Gross hematuria   . History of esophageal dilatation    FOR STRIUCTURE  . History of gout   . History of hiatal hernia   . History of kidney stones   . Hyperlipidemia   . Hypertension   . Hypothyroidism   . Internal hemorrhoids   . Irritable bowel syndrome   . Mild aortic stenosis    by echo 2018  . Obesity   . Orthostatic hypotension   . Partial seizure disorder (HCC) NEUROLOGIST-- DR WILLIS   NOCTURNAL  . PONV (postoperative nausea and vomiting)    hard time getting iv site-had to do neck stick 2 yrs ago  . Pulmonary fibrosis ([place])   . S/P pericardial cyst excision    02-05-2013  benign  . Seizures ([place])    none in yrs  . SVT (supraventricular tachycardia) ([place])   . Trigger finger, acquired 09/30/2015   Right middle finger  . Vitamin D deficiency     Past Surgical History:  Procedure Laterality Date  . BIOPSY OF MEDIASTINAL MASS N/A 02/05/2013   Procedure: RESECTION OF MEDIASTINAL MASS;  Surgeon: Ivin Poot, MD;  Location: [place];  Service: Thoracic;  Laterality: N/A;  . CARDIAC CATHETERIZATION Right 01/24/2015   Procedure: right femoral ARTERIAL LINE INSERTION;  Surgeon: Ivin Poot, MD;  Location: [place];  Service: Thoracic;  Laterality: Right;  . CARDIOVASCULAR STRESS TEST  11-22-2013  dr Irish Lack   normal lexiscan study/  no ischemia/  normal LVF and wall motion/ ef 81%  . COLONOSCOPY  last one 2014  . CORONARY ANGIOGRAM  03/10/2011   Procedure: CORONARY ANGIOGRAM;  Surgeon: Jettie Booze, MD;  Location: [facility] CATH LAB;  Service: Cardiovascular;;  . CORONARY ANGIOPLASTY  WITH STENT PLACEMENT  11-22-2009  dr Irish Lack   Acute inferoposterior MI/  ef 60%,  PCI with DES x1 to dRCA,  25%  mLAD  . CORONARY ANGIOPLASTY WITH STENT PLACEMENT  06-26-2010  dr Daneen Schick   Cutting balloon angioplasty to dRCA with DES x1,  40% mLAD (non-obstructive CAD)  . CYSTOSCOPY WITH RETROGRADE PYELOGRAM, URETEROSCOPY AND STENT PLACEMENT Right 02/13/2014   Procedure: CYSTOSCOPY WITH RETROGRADE PYELOGRAM, URETEROSCOPY AND STENT PLACEMENT;  Surgeon: Bernestine Amass, MD;  Location: [facility];  Service: Urology;  Laterality: Right;  . ECTOPIC PREGNANCY SURGERY  YRS AGO   SALPINGECTOMY  . ESOPHAGOGASTRODUODENOSCOPY N/A 02/15/2014  Procedure: ESOPHAGOGASTRODUODENOSCOPY (EGD);  Surgeon: Jerene Bears, MD;  Location: [facility];  Service: Endoscopy;  Laterality: N/A;  . ESOPHAGOGASTRODUODENOSCOPY (EGD) WITH ESOPHAGEAL DILATION  05-20-2011  . HOLMIUM LASER APPLICATION Right [id number]   Procedure: HOLMIUM LASER APPLICATION;  Surgeon: Bernestine Amass, MD;  Location: [facility];  Service: Urology;  Laterality: Right;  . LEFT HEART CATHETERIZATION WITH CORONARY ANGIOGRAM N/A 03/14/2012   Procedure: LEFT HEART CATHETERIZATION WITH CORONARY ANGIOGRAM;  Surgeon: Sueanne Margarita, MD;  Location: [place] CATH LAB;  Service: Cardiovascular;  Laterality: N/A;  Normal LM,  50% pLAD,  70% mLAD,  D2 50-70%, very tortuous LAD,  70-82% ostial LCFX,  50% in-stent restenosis of  dRCA and mRCA  stent and 50-70% ostial PDA,  normal LVSF, ef 60%  . LUNG BIOPSY Right 01/24/2015   Procedure: RIGHT LUNG BIOPSY;  Surgeon: Ivin Poot, MD;  Location: [place];  Service: Thoracic;  Laterality: Right;  . MEDIASTERNOTOMY N/A 02/05/2013   Procedure: MEDIAN STERNOTOMY;  Surgeon: Ivin Poot, MD;  Location: [place];  Service: Thoracic;  Laterality: N/A;  . MEDIASTERNOTOMY Left 02/05/2013  . NEEDLE GUIDED EXCISION BREAST CALCIFICATIONS Right 08-09-2008  . PERCUTANEOUS CORONARY STENT INTERVENTION (PCI-S) N/A  04/03/2012   Procedure: PERCUTANEOUS CORONARY STENT INTERVENTION (PCI-S);  Surgeon: Jettie Booze, MD;  Location: [facility] CATH LAB;  Service: Cardiovascular;  Laterality: N/A;   Successful PCI  mLAD with 2.75x12 Promus stent, postdilated to >0.52mm  . THORACOTOMY Right 01/24/2015   Procedure: RIGHT MINI/LIMITED THORACOTOMY;  Surgeon: Ivin Poot, MD;  Location: [place];  Service: Thoracic;  Laterality: Right;  . TRANSTHORACIC ECHOCARDIOGRAM  11-17-2011   grade I diastolic dysfunction/  ef 55-60%  . VIDEO BRONCHOSCOPY N/A 01/24/2015   Procedure: RIGHT VIDEO BRONCHOSCOPY;  Surgeon: Ivin Poot, MD;  Location: [facility] OR;  Service: Thoracic;  Laterality: N/A;    Current Medications: Current Meds  Medication Sig  . ALPRAZolam (XANAX) 0.25 MG tablet Take 0.25 mg by mouth 3 (three) times daily.   Marland Kitchen aspirin EC 81 MG tablet Take 81 mg by mouth daily.  . calcitRIOL (ROCALTROL) 0.25 MCG capsule Take 0.25 mcg by mouth daily.   . CVS D3 50 MCG (2000 UT) CAPS Take 2,000 Units by mouth daily.  Marland Kitchen diltiazem (CARDIZEM CD) 120 MG 24 hr capsule TAKE 1 CAPSULE BY MOUTH EVERY DAY  . DULoxetine (CYMBALTA) 60 MG capsule Take 60 mg by mouth daily.  . ferrous sulfate 325 (65 FE) MG tablet Take 325 mg by mouth daily with breakfast.   . HYDROcodone-acetaminophen (NORCO) 10-325 MG tablet Take by mouth.  . levETIRAcetam (KEPPRA) 500 MG tablet TAKE 1 TABLET BY MOUTH TWICE A DAY  . levothyroxine (SYNTHROID, LEVOTHROID) 112 MCG tablet Take 112 mcg by mouth daily.  . magnesium oxide (MAG-OX) 400 MG tablet TAKE 1 TABLET BY MOUTH TWICE A DAY  . nebivolol (BYSTOLIC) 5 MG tablet Take 1.5 mg by mouth daily.   . nitroGLYCERIN (NITROSTAT) 0.4 MG SL tablet PLACE 1 TABLET (0.4 MG TOTAL) UNDER THE TONGUE EVERY 5 (FIVE) MINUTES AS NEEDED FOR CHEST PAIN.  Marland Kitchen pantoprazole (PROTONIX) 40 MG tablet Take 1 tablet (40 mg total) by mouth 2 (two) times daily.  . potassium chloride SA (K-DUR,KLOR-CON) 20 MEQ tablet Take 1 tablet by mouth daily.    . rosuvastatin (CRESTOR) 20 MG tablet TAKE 1 TABLET BY MOUTH EVERY DAY IN THE MORNING     Allergies:   Patient has no known allergies.   Social  History   Socioeconomic History  . Marital status: Married    Spouse name: Fritz Pickerel  . Number of children: 0  . Years of education: hs  . Highest education level: Not on file  Occupational History  . Occupation: Retired    Fish farm manager: RETIRED  Tobacco Use  . Smoking status: Former Smoker    Packs/day: 0.30    Years: 3.00    Pack years: 0.90    Types: Cigarettes    Start date: 11/08/1964    Quit date: 11/09/1967    Years since quitting: 51.2  . Smokeless tobacco: Never Used  Substance and Sexual Activity  . Alcohol use: No    Alcohol/week: 0.0 standard drinks  . Drug use: No  . Sexual activity: Not on file  Other Topics Concern  . Not on file  Social History Narrative   Lives at home, married   Left-handed   Daily caffeine use: coffee.   Social Determinants of Health   Financial Resource Strain:   . Difficulty of Paying Living Expenses: Not on file  Food Insecurity:   . Worried About Charity fundraiser in the Last Year: Not on file  . Ran Out of Food in the Last Year: Not on file  Transportation Needs:   . Lack of Transportation (Medical): Not on file  . Lack of Transportation (Non-Medical): Not on file  Physical Activity:   . Days of Exercise per Week: Not on file  . Minutes of Exercise per Session: Not on file  Stress:   . Feeling of Stress : Not on file  Social Connections:   . Frequency of Communication with Friends and Family: Not on file  . Frequency of Social Gatherings with Friends and Family: Not on file  . Attends Religious Services: Not on file  . Active Member of Clubs or Organizations: Not on file  . Attends Archivist Meetings: Not on file  . Marital Status: Not on file     Family History: The patient's family history includes Breast cancer in her sister and another family member; CVA in her  mother; Coronary artery disease in her father; Diabetes Mellitus I in her father; Emphysema in her father; Heart attack in her mother; Heart disease in her father; Hypertension in her father. There is no history of Colon cancer, Stomach cancer, Esophageal cancer, or Rectal cancer.  ROS:   Please see the history of present illness.    ROS  All other systems reviewed and negative.   EKGs/Labs/Other Studies Reviewed:    The following studies were reviewed today: Lexiscan myoview  EKG:  EKG is ordered today and showed NSR with anterior infarct unchanged from 2019  Recent Labs: 03/08/2018: BUN 16; Creatinine, Ser 1.25; Hemoglobin 13.5; Platelets 417; Potassium 3.5; Sodium 139 09/13/2018: ALT 10   Recent Lipid Panel    Component Value Date/Time   CHOL 160 09/13/2018 0956   TRIG 94 09/13/2018 0956   HDL 70 09/13/2018 0956   CHOLHDL 2.3 09/13/2018 0956   CHOLHDL 1.8 06/06/2015 0928   VLDL 11 06/06/2015 0928   LDLCALC 73 09/13/2018 0956    Physical Exam:    VS:  BP 136/78   Pulse 69   Ht 4\' 11"  (1.499 m)   Wt 144 lb 3.2 oz (65.4 kg)   BMI 29.12 kg/m     Wt Readings from Last 3 Encounters:  01/25/19 144 lb 3.2 oz (65.4 kg)  10/31/18 141 lb 6.4 oz (64.1 kg)  09/13/18 145 lb (  65.8 kg)     GEN:  Well nourished, well developed in no acute distress HEENT: Normal NECK: No JVD; No carotid bruits LYMPHATICS: No lymphadenopathy CARDIAC: RRR, no murmurs, rubs, gallops RESPIRATORY:  Clear to auscultation without rales, wheezing or rhonchi  ABDOMEN: Soft, non-tender, non-distended MUSCULOSKELETAL:  No edema; No deformity  SKIN: Warm and dry NEUROLOGIC:  Alert and oriented x 3 PSYCHIATRIC:  Normal affect   ASSESSMENT:    1. Atherosclerosis of native coronary artery of native heart without angina pectoris   2. Essential hypertension   3. Mild aortic stenosis   4. Orthostatic hypotension   5. Paroxysmal SVT (supraventricular tachycardia) (HCC)   6. Pure hypercholesterolemia   7.  Pericardial cyst    PLAN:    In order of problems listed above:  1. ASCAD -s/p inf-post MI 2011 s/p DES to RCA. -recurrent CP s/pDES to RCA 06/2010 -recurrent CP s/p DES to LAD 2014withresidual dz treated medically. -she has a long hx of chronic noncardiac CP related to esophageal spasm and stricture.  -she continues to have her chronic CP and is now spitting up her pills and is seeing GI next week for possible recurrent stricture -recurrent CP 08/2018 with no ischemia on nuclear stress test 09/2018 -continue ASA, BB and statin -Plavix stopped due to bleeding gums  2. HTN -BP controlled -continue Cardizem CD 120mg  daily and Bystolic 2.5mg  daily   3. Mild AS -very mild by echo 2018 with mean gradient 11 mmHg.   -repeat echo 09/2018 with minimal AS mean AVG 46mmHg  4. Orthostatic hypotension -this is been fairly stablealthough she still has problems with dizziness .  -She has not had any further syncope after her episode of dehydration.  5. SVT -she has had no recurrence of palpitations.  -continue Cardizem and Bystolic  6. Pericardial Cyst -s/p resection  7.Hyperlipidemia -LDL goal less than 70.  -LDL 73 in Sept 2020 -continue Crestor 20mg  daily  8.  Leg pain -she is having pain in her calves at random times and not with walking -the pain is similar to what she had last year when she had her DVT -I will get venous dopplers to rule out recurrent DVT   Medication Adjustments/Labs and Tests Ordered: Current medicines are reviewed at length with the patient today.  Concerns regarding medicines are outlined above.  Orders Placed This Encounter  Procedures  . EKG 12/Charge capture   No orders of the defined types were placed in this encounter.   Signed, Fransico Him, MD  01/25/2019 11:07 AM    [place]

## 2019-01-25 NOTE — Patient Instructions (Signed)
Medication Instructions:  Your physician recommends that you continue on your current medications as directed. Please refer to the Current Medication list given to you today.  *If you need a refill on your cardiac medications before your next appointment, please call your pharmacy*  Testing/Procedures: Your physician has requested that you have a lower extremity venous duplex. This test is an ultrasound of the veins in the legs. It looks at venous blood flow that carries blood from the heart to the legs. Allow one hour for a Lower Venous exam. There are no restrictions or special instructions.   Follow-Up: At [facility], you and your health needs are our priority.  As part of our continuing mission to provide you with exceptional heart care, we have created designated Provider Care Teams.  These Care Teams include your primary Cardiologist (physician) and Advanced Practice Providers (APPs -  Physician Assistants and Nurse Practitioners) who all work together to provide you with the care you need, when you need it.  Your next appointment:   1 year(s)  The format for your next appointment:   In Person  Provider:   You may see Fransico Him, MD or one of the following Advanced Practice Providers on your designated Care Team:    Melina Copa, PA-C  Ermalinda Barrios, PA-C   Other Instructions COVID-19 Vaccine Information can be found at: [email or web address].uk For questions related to vaccine distribution or appointments, please email [email or web address] or call [phone].

## 2019-01-29 ENCOUNTER — Telehealth: Payer: Self-pay

## 2019-01-29 ENCOUNTER — Ambulatory Visit (HOSPITAL_COMMUNITY)
Admission: RE | Admit: 2019-01-29 | Discharge: 2019-01-29 | Disposition: A | Discharge status: Home / Self Care | Payer: PPO | Source: Ambulatory Visit | Attending: Cardiovascular Disease | Admitting: Cardiovascular Disease

## 2019-01-29 ENCOUNTER — Other Ambulatory Visit: Payer: Self-pay

## 2019-01-29 DIAGNOSIS — I82409 Acute embolism and thrombosis of unspecified deep veins of unspecified lower extremity: Secondary | ICD-10-CM

## 2019-01-29 NOTE — Telephone Encounter (Signed)
Error

## 2019-01-29 NOTE — Telephone Encounter (Signed)
Alexis returned my call and states that Dr. Tamala Julian recommends that the patient start on Eliquis or Xarelto and be referred to vascular surgery.

## 2019-01-29 NOTE — Telephone Encounter (Signed)
Called [facility] to make patient's PCP aware of DVT and to find out what recommendations she has. Operator took down my message and stated should would let Dr. Tamala Julian know and someone would call back.

## 2019-01-29 NOTE — Telephone Encounter (Signed)
Please make sure that Dr. Tamala Julian is going to start her on the Eliquis or  Xarelto since we do not follow DVTs and also if they will be making referral for vascular surgyer

## 2019-01-29 NOTE — Telephone Encounter (Signed)
-----   Message from Frederik Schmidt, RN sent at 01/29/2019  1:18 PM EST -----  ----- Message ----- From: Sueanne Margarita, MD Sent: 01/29/2019   1:17 PM EST To: Carol Ada, MD, [facility] [place] Triage  Please call PCP today and notify them that patient was having leg pain when I saw her and got repeat LE venous dopplers due to recent hx of DVT.  She had recently been taken off her anticoagulation.  She has a chronic DVT in LLE - please notify PCP about need for restarting anticoagulation and have them notify me of what they want to do

## 2019-01-30 DIAGNOSIS — N1831 Chronic kidney disease, stage 3a: Secondary | ICD-10-CM | POA: Diagnosis not present

## 2019-01-30 DIAGNOSIS — I129 Hypertensive chronic kidney disease with stage 1 through stage 4 chronic kidney disease, or unspecified chronic kidney disease: Secondary | ICD-10-CM | POA: Diagnosis not present

## 2019-01-30 DIAGNOSIS — E559 Vitamin D deficiency, unspecified: Secondary | ICD-10-CM | POA: Diagnosis not present

## 2019-01-30 DIAGNOSIS — E889 Metabolic disorder, unspecified: Secondary | ICD-10-CM | POA: Diagnosis not present

## 2019-01-30 DIAGNOSIS — M9080 Osteopathy in diseases classified elsewhere, unspecified site: Secondary | ICD-10-CM | POA: Diagnosis not present

## 2019-01-30 DIAGNOSIS — D631 Anemia in chronic kidney disease: Secondary | ICD-10-CM | POA: Diagnosis not present

## 2019-01-30 DIAGNOSIS — N183 Chronic kidney disease, stage 3 unspecified: Secondary | ICD-10-CM | POA: Diagnosis not present

## 2019-01-31 NOTE — Telephone Encounter (Signed)
Called PCP office to ask if Dr. Tamala Julian would be starting the patient on anticoagulation medications and referring her to vascular surgery. Operator states that she will forward the message to Dr. Tamala Julian and someone will return my call.

## 2019-01-31 NOTE — Telephone Encounter (Signed)
Spoke with Dr. Thompson Caul nurse, Ubaldo Glassing, and Dr. Tamala Julian states she does not want to prescribe an anticoagulant until she can see the patient in person, however if Dr. Radford Pax would like to prescribe it she will follow up with the patient in a couple weeks and continue to follow her DVT and prescribe the medication. She will make referral to vascular surgery after visit.

## 2019-01-31 NOTE — Telephone Encounter (Signed)
Please forward this to [facility] to see if she can find a way for her to afford Eliquis or Xarelto

## 2019-01-31 NOTE — Telephone Encounter (Signed)
Patient returning call. She states she has a doctors appointment 8:30 tomorrow in the morning 1/21, but will be back home around lunch time.

## 2019-01-31 NOTE — Telephone Encounter (Signed)
Patient states that her gums still bleed every time she brushes her teeth. She also states that she has tried Eliquis in the past and is unable to afford it.

## 2019-01-31 NOTE — Telephone Encounter (Signed)
Please find out if patient has had any further problems with bleeding gums.  If no then start Eliquis 5mg  BID for chronic DVT and have her seen by her PCP next week to follow this.

## 2019-01-31 NOTE — Telephone Encounter (Signed)
Left message to call back  

## 2019-02-01 ENCOUNTER — Encounter: Payer: Self-pay | Admitting: Internal Medicine

## 2019-02-01 ENCOUNTER — Other Ambulatory Visit: Payer: Self-pay

## 2019-02-01 ENCOUNTER — Ambulatory Visit: Payer: PPO | Admitting: Internal Medicine

## 2019-02-01 VITALS — BP 140/90 | HR 80 | Temp 98.3°F | Ht 58.5 in | Wt 144.1 lb

## 2019-02-01 DIAGNOSIS — R1310 Dysphagia, unspecified: Secondary | ICD-10-CM | POA: Diagnosis not present

## 2019-02-01 DIAGNOSIS — K219 Gastro-esophageal reflux disease without esophagitis: Secondary | ICD-10-CM

## 2019-02-01 DIAGNOSIS — Z01818 Encounter for other preprocedural examination: Secondary | ICD-10-CM | POA: Diagnosis not present

## 2019-02-01 MED ORDER — APIXABAN 5 MG PO TABS: 5.0000 mg | ORAL_TABLET | Freq: Two times a day (BID) | ORAL | 3 refills | Status: DC

## 2019-02-01 NOTE — Progress Notes (Signed)
HISTORY OF PRESENT ILLNESS:  [name] is a 74 y.o. female with multiple medical problems who presents today with her husband with chief complaints of chest pain and dysphagia.  These are chronic problems.  Her chronic chest pain has undergone GI evaluation previously without GI cause found or suspected.  In terms of her dysphagia, she has GERD, history of esophageal stricture, and esophageal dysmotility.  She tells me that her trouble with swallowing to both solids and liquids as well as pills has been worse in the past 6 months.  She feels that repeat upper endoscopy with esophageal dilation would be beneficial.  Her last upper endoscopy was performed January 2019.  At that time to evaluate epigastric pain and noncardiac chest pain.  Examination was unremarkable.  Patient tells me that she is no longer on chronic anticoagulation therapy though she may need to resume such as she thinks she was told that she has a blood clot in her left lower extremity after recent Doppler study to evaluate leg cramps.  She continues on pantoprazole 40 mg twice daily for her GERD.  Her last colonoscopy was December 2011.  This was normal.  REVIEW OF SYSTEMS:  All non-GI ROS negative unless otherwise stated in the HPI except for back pain, depression, muscle cramps, hearing problems  Past Medical History:  Diagnosis Date  . Anxiety   . Arthritis   . Carpal tunnel syndrome, bilateral 07/31/2015  . Cataract   . Cervical spondylosis without myelopathy   . Chronic chest pain   . Chronic kidney disease   . Chronic low back pain   . Chronotropic incompetence   . CKD (chronic kidney disease), stage III   . Coronary artery disease    a. inf-post MI 2011 s/p DES to RCA. b. DES to RCA 06/2010. c. DES to LAD 2014, residual dz treated medically.  . Depression   . DVT (deep venous thrombosis) (HCC)    left  . Esophageal stricture   . GERD (gastroesophageal reflux disease)   . Gross hematuria   . History of  esophageal dilatation    FOR STRIUCTURE  . History of gout   . History of hiatal hernia   . History of kidney stones   . Hyperlipidemia   . Hypertension   . Hypothyroidism   . Internal hemorrhoids   . Irritable bowel syndrome   . Mild aortic stenosis    by echo 2018  . Obesity   . Orthostatic hypotension   . Partial seizure disorder (HCC) NEUROLOGIST-- DR WILLIS   NOCTURNAL  . PONV (postoperative nausea and vomiting)    hard time getting iv site-had to do neck stick 2 yrs ago  . Pulmonary fibrosis ([place])   . S/P pericardial cyst excision    02-05-2013  benign  . Seizures ([place])    none in yrs  . SVT (supraventricular tachycardia) ([place])   . Trigger finger, acquired 09/30/2015   Right middle finger  . Vitamin D deficiency     Past Surgical History:  Procedure Laterality Date  . BIOPSY OF MEDIASTINAL MASS N/A 02/05/2013   Procedure: RESECTION OF MEDIASTINAL MASS;  Surgeon: Ivin Poot, MD;  Location: [place];  Service: Thoracic;  Laterality: N/A;  . CARDIAC CATHETERIZATION Right 01/24/2015   Procedure: right femoral ARTERIAL LINE INSERTION;  Surgeon: Ivin Poot, MD;  Location: [place];  Service: Thoracic;  Laterality: Right;  . CARDIOVASCULAR STRESS TEST  11-22-2013  dr Irish Lack   normal lexiscan study/  no ischemia/  normal LVF and wall motion/ ef 81%  . COLONOSCOPY  last one 2014  . CORONARY ANGIOGRAM  03/10/2011   Procedure: CORONARY ANGIOGRAM;  Surgeon: Jettie Booze, MD;  Location: [facility] C[facility] LAB;  Service: Cardiovascular;;  . CORONARY ANGIOPLASTY WITH STENT PLACEMENT  11-22-2009  dr Irish Lack   Acute inferoposterior MI/  ef 60%,  PCI with DES x1 to dRCA,  25%  mLAD  . CORONARY ANGIOPLASTY WITH STENT PLACEMENT  06-26-2010  dr Daneen Schick   Cutting balloon angioplasty to dRCA with DES x1,  40% mLAD (non-obstructive CAD)  . CYSTOSCOPY WITH RETROGRADE PYELOGRAM, URETEROSCOPY AND STENT PLACEMENT Right 02/13/2014   Procedure: CYSTOSCOPY WITH RETROGRADE PYELOGRAM, URETEROSCOPY  AND STENT PLACEMENT;  Surgeon: Bernestine Amass, MD;  Location: [facility];  Service: Urology;  Laterality: Right;  . ECTOPIC PREGNANCY SURGERY  YRS AGO   SALPINGECTOMY  . ESOPHAGOGASTRODUODENOSCOPY N/A 02/15/2014   Procedure: ESOPHAGOGASTRODUODENOSCOPY (EGD);  Surgeon: Jerene Bears, MD;  Location: [facility];  Service: Endoscopy;  Laterality: N/A;  . ESOPHAGOGASTRODUODENOSCOPY (EGD) WITH ESOPHAGEAL DILATION  05-20-2011  . HOLMIUM LASER APPLICATION Right [id number]   Procedure: HOLMIUM LASER APPLICATION;  Surgeon: Bernestine Amass, MD;  Location: [facility];  Service: Urology;  Laterality: Right;  . LEFT HEART CATHETERIZATION WITH CORONARY ANGIOGRAM N/A 03/14/2012   Procedure: LEFT HEART CATHETERIZATION WITH CORONARY ANGIOGRAM;  Surgeon: Sueanne Margarita, MD;  Location: [place] CATH LAB;  Service: Cardiovascular;  Laterality: N/A;  Normal LM,  50% pLAD,  70% mLAD,  D2 50-70%, very tortuous LAD,  70-82% ostial LCFX,  50% in-stent restenosis of  dRCA and mRCA  stent and 50-70% ostial PDA,  normal LVSF, ef 60%  . LUNG BIOPSY Right 01/24/2015   Procedure: RIGHT LUNG BIOPSY;  Surgeon: Ivin Poot, MD;  Location: [place];  Service: Thoracic;  Laterality: Right;  . MEDIASTERNOTOMY N/A 02/05/2013   Procedure: MEDIAN STERNOTOMY;  Surgeon: Ivin Poot, MD;  Location: [place];  Service: Thoracic;  Laterality: N/A;  . MEDIASTERNOTOMY Left 02/05/2013  . NEEDLE GUIDED EXCISION BREAST CALCIFICATIONS Right 08-09-2008  . PERCUTANEOUS CORONARY STENT INTERVENTION (PCI-S) N/A 04/03/2012   Procedure: PERCUTANEOUS CORONARY STENT INTERVENTION (PCI-S);  Surgeon: Jettie Booze, MD;  Location: [facility] CATH LAB;  Service: Cardiovascular;  Laterality: N/A;   Successful PCI  mLAD with 2.75x12 Promus stent, postdilated to >0.45mm  . THORACOTOMY Right 01/24/2015   Procedure: RIGHT MINI/LIMITED THORACOTOMY;  Surgeon: Ivin Poot, MD;  Location[place];  Service: Thoracic;  Laterality: Right;  . TRANSTHORACIC  ECHOCARDIOGRAM  11-17-2011   grade I diastolic dysfunction/  ef 55-60%  . VIDEO BRONCHOSCOPY N/A 01/24/2015   Procedure: RIGHT VIDEO BRONCHOSCOPY;  Surgeon: Ivin Poot, MD;  Location[facility] OR;  Service: Thoracic;  Laterality: N/A;    Social History Lilliauna VERLYN DETAR  reports that she quit smoking about 51 years ago. Her smoking use included cigarettes. She started smoking about 54 years ago. She has a 0.90 pack-year smoking history. She has never used smokeless tobacco. She reports that she does not drink alcohol or use drugs.  family history includes Breast cancer in her sister and another family member; CVA in her mother; Coronary artery disease in her father; Diabetes Mellitus I in her father; Emphysema in her father; Heart attack in her mother; Heart disease in her father; Hypertension in her father.  No Known Allergies     PHYSICAL EXAMINATION: Vital signs: BP 140/90 (BP Location: Left Arm,  Patient Position: Sitting, Cuff Size: Normal)   Pulse 80   Temp 98.3 F (36.8 C)   Ht 4' 10.5" (1.486 m) Comment: height measured without shoes  Wt 144 lb 2 oz (65.4 kg)   BMI 29.61 kg/m   Constitutional: generally well-appearing, no acute distress Psychiatric: alert and oriented x3, cooperative Eyes: extraocular movements intact, anicteric, conjunctiva pink Mouth: oral pharynx moist, no lesions Neck: supple no lymphadenopathy Cardiovascular: heart regular rate and rhythm, no murmur Lungs: clear to auscultation bilaterally Abdomen: soft, nontender, nondistended, no obvious ascites, no peritoneal signs, normal bowel sounds, no organomegaly Rectal: Omitted Extremities: no clubbing, cyanosis, or lower extremity edema bilaterally Skin: no lesions on visible extremities Neuro: No focal deficits.  Cranial nerves intact  ASSESSMENT:  1.  Chronic dysphagia.  Worsening over the past 6 months.  Rule out recurrent stricture.  Rule out dysmotility 2.  Chronic GERD.  Classic symptoms controlled with  PPI 3.  Chronic chest pain.  Negative recent cardiac evaluation per patient.  Non-GI 4.  Multiple significant medical problems 5.  May need to go back on anticoagulation therapy as noted above   PLAN:  1.  Reflux precautions 2.  Continue pantoprazole 3.  Schedule upper endoscopy with esophageal dilation.  The patient is HIGH RISK given her comorbidities and the potential need to work around her anticoagulation therapy.  She will talk to her prescribing physician regarding this at her upcoming procedure.  She will also be in touch with this office for an update regarding her possible initiation of anticoagulation therapy preprocedure.The nature of the procedure, as well as the risks, benefits, and alternatives were carefully and thoroughly reviewed with the patient. Ample time for discussion and questions allowed. The patient understood, was satisfied, and agreed to proceed. A total of 40 minutes was spent preparing to see the patient, obtaining history, performing medically appropriate examination, counseling and educating the patient and her husband regarding her condition, setting up upper endoscopy for next Wednesday, and documenting the clinical information in the health record.

## 2019-02-01 NOTE — Telephone Encounter (Signed)
I spoke with the patient and she is willing to pay $90 for a 3 month supply of Elliquis. She informed me that she saw her GI doctor this morning and she is to have an upper endoscopy with esophageal dilation on Wednesday 01/27 and is wondering if she should wait to start Elliquis until after the procedure.  She is also seeing her PCP, Dr. Tamala Julian, on Tuesday 01/26 to follow-up on DVT.

## 2019-02-01 NOTE — Telephone Encounter (Signed)
I would like her to start the Eliquis today and let her GI MD know that she has a DVT that has not resolved and was placed back on Eliquis.  Please get her in to see her PCP tomorrow so they can manage her anticoagulation with her GI procedure.

## 2019-02-01 NOTE — Telephone Encounter (Signed)
Under her current insurance, she saves money if she picks up Eliquis as a 3 month supply ($90) compared to a 1 month supply ($45). She does not have a deductible on her plan. This breaks down to $30/month if she orders the Eliquis as a 3 month supply which is the cheapest copay compared to other Medicare plans. Would see if she is ok paying this. Otherwise she could try calling their patient assistance program at [phone] but qualifying for copay assistance is difficult for patients who have medication insurance.

## 2019-02-01 NOTE — Patient Instructions (Signed)
You have been scheduled for an endoscopy and colonoscopy. Please follow the written instructions given to you at your visit today. Please pick up your prep supplies at the pharmacy within the next 1-3 days. If you use inhalers (even only as needed), please bring them with you on the day of your procedure.  

## 2019-02-01 NOTE — Telephone Encounter (Signed)
Spoke with patient and her husband Fritz Pickerel and informed them that she needs to start Eliquis 5 mg BID today. They are going to call GI doctor to make them aware of DVT and starting anticoagulation. Patient states that Dr. Tamala Julian does not work on Fridays bu they will call her office and update them on procedure and start of Eliquis to see how the patient should proceed.

## 2019-02-02 ENCOUNTER — Other Ambulatory Visit: Payer: Self-pay | Admitting: Cardiology

## 2019-02-05 ENCOUNTER — Ambulatory Visit (INDEPENDENT_AMBULATORY_CARE_PROVIDER_SITE_OTHER): Payer: PPO

## 2019-02-05 ENCOUNTER — Telehealth: Payer: Self-pay | Admitting: *Deleted

## 2019-02-05 DIAGNOSIS — Z1159 Encounter for screening for other viral diseases: Secondary | ICD-10-CM | POA: Diagnosis not present

## 2019-02-05 NOTE — Telephone Encounter (Signed)
Spoke with pt and she states she has not been taking the Eliquis and has been instructed to resume it on Thursday after her procedure. She knows to proceed as scheduled.States she has spoken with Dr. Landis Gandy nurse regarding this. Dr. Henrene Pastor aware.

## 2019-02-05 NOTE — Telephone Encounter (Signed)
Patient stated she has not started the Eliquis and will start it after EGD 02/07/19.

## 2019-02-05 NOTE — Telephone Encounter (Signed)
Patient re=started on Eliquis  BID  after she had office with you. Verifying you want her re-scheduled.  Thanks!

## 2019-02-05 NOTE — Telephone Encounter (Signed)
Per Dr. Blanch Media notes, plan was for EGD with dilation. However, since she just restarted Eliquis BID, would recommend rescheduling EGD until we can confirm that she is able to hold the Eliquis x2 days pre-op. If she already has clearance to hold for procedure (since she was only just restarted on the medication) and hasn't taken today, then would be ok to proceed (again, if clearly holding x2 days). Otherwise, reschedule.

## 2019-02-05 NOTE — Telephone Encounter (Signed)
Noted.  Thanks for clarifying. 

## 2019-02-05 NOTE — Telephone Encounter (Signed)
Vaughan Basta, Please call her prescribing physician to see if we can hold Eliquis today (would be second dose, likely) and until her scheduled procedure this Wednesday. If not, she will need to schedule down the road, when they feel it is reasonable to do so. Thanks  Jp

## 2019-02-05 NOTE — Telephone Encounter (Signed)
In that case, should be ok to proceed as scheduled.

## 2019-02-06 DIAGNOSIS — I82552 Chronic embolism and thrombosis of left peroneal vein: Secondary | ICD-10-CM | POA: Diagnosis not present

## 2019-02-06 LAB — SARS CORONAVIRUS 2 (TAT 6-24 HRS): SARS Coronavirus 2: NEGATIVE

## 2019-02-07 ENCOUNTER — Telehealth: Payer: Self-pay | Admitting: *Deleted

## 2019-02-07 ENCOUNTER — Other Ambulatory Visit: Payer: Self-pay

## 2019-02-07 ENCOUNTER — Encounter: Payer: Self-pay | Admitting: Internal Medicine

## 2019-02-07 ENCOUNTER — Ambulatory Visit (AMBULATORY_SURGERY_CENTER): Payer: PPO | Admitting: Internal Medicine

## 2019-02-07 VITALS — BP 139/75 | HR 70 | Temp 96.9°F | Resp 12 | Ht <= 58 in | Wt 144.0 lb

## 2019-02-07 DIAGNOSIS — K317 Polyp of stomach and duodenum: Secondary | ICD-10-CM

## 2019-02-07 DIAGNOSIS — R1310 Dysphagia, unspecified: Secondary | ICD-10-CM

## 2019-02-07 DIAGNOSIS — K222 Esophageal obstruction: Secondary | ICD-10-CM | POA: Diagnosis not present

## 2019-02-07 DIAGNOSIS — K219 Gastro-esophageal reflux disease without esophagitis: Secondary | ICD-10-CM | POA: Diagnosis not present

## 2019-02-07 DIAGNOSIS — K449 Diaphragmatic hernia without obstruction or gangrene: Secondary | ICD-10-CM | POA: Diagnosis not present

## 2019-02-07 MED ORDER — SODIUM CHLORIDE 0.9 % IV SOLN: 500.0000 mL | Freq: Once | INTRAVENOUS | Status: DC

## 2019-02-07 NOTE — Progress Notes (Signed)
Called to room to assist during endoscopic procedure.  Patient ID and intended procedure confirmed with present staff. Received instructions for my participation in the procedure from the performing physician.  

## 2019-02-07 NOTE — Patient Instructions (Addendum)
YOU HAD AN ENDOSCOPIC PROCEDURE TODAY AT [facility]:   Refer to the procedure report that was given to you for any specific questions about what was found during the examination.  If the procedure report does not answer your questions, please call your gastroenterologist to clarify.  If you requested that your care partner not be given the details of your procedure findings, then the procedure report has been included in a sealed envelope for you to review at your convenience later.  YOU SHOULD EXPECT: Some feelings of bloating in the abdomen. Passage of more gas than usual.  Walking can help get rid of the air that was put into your GI tract during the procedure and reduce the bloating. If you had a lower endoscopy (such as a colonoscopy or flexible sigmoidoscopy) you may notice spotting of blood in your stool or on the toilet paper. If you underwent a bowel prep for your procedure, you may not have a normal bowel movement for a few days.  Please Note:  You might notice some irritation and congestion in your nose or some drainage.  This is from the oxygen used during your procedure.  There is no need for concern and it should clear up in a day or so.  SYMPTOMS TO REPORT IMMEDIATELY:    Following upper endoscopy (EGD)  Vomiting of blood or coffee ground material  New chest pain or pain under the shoulder blades  Painful or persistently difficult swallowing  New shortness of breath  Fever of 100F or higher  Black, tarry-looking stools  For urgent or emergent issues, a gastroenterologist can be reached at any hour by calling [phone].    ACTIVITY:  You should plan to take it easy for the rest of today and you should NOT DRIVE or use heavy machinery until tomorrow (because of the sedation medicines used during the test).    FOLLOW UP: Our staff will call the number listed on your records 48-72 hours following your procedure to check on you and address any questions or  concerns that you may have regarding the information given to you following your procedure. If we do not reach you, we will leave a message.  We will attempt to reach you two times.  During this call, we will ask if you have developed any symptoms of COVID 19. If you develop any symptoms (ie: fever, flu-like symptoms, shortness of breath, cough etc.) before then, please call [phone].  If you test positive for Covid 19 in the 2 weeks post procedure, please call and report this information to [place].    If any biopsies were taken you will be contacted by phone or by letter within the next 1-3 weeks.  Please call us at [phone] if you have not heard about the biopsies in 3 weeks.    SIGNATURES/CONFIDENTIALITY: You and/or your care partner have signed paperwork which will be entered into your electronic medical record.  These signatures attest to the fact that that the information above on your After Visit Summary has been reviewed and is understood.  Full responsibility of the confidentiality of this discharge information lies with you and/or your care-partner.

## 2019-02-07 NOTE — Progress Notes (Signed)
A and O x3. Report to RN. Tolerated MAC anesthesia well.Teeth unchanged after procedure.

## 2019-02-07 NOTE — Telephone Encounter (Signed)
Noted on other encounter

## 2019-02-07 NOTE — Op Note (Signed)
[facility] Patient Name: [name] Procedure Date: 02/07/2019 8:15 AM MRN: [id number] Endoscopist: Docia Chuck. Henrene Pastor , MD Age: 74 Referring MD:  Date of Birth: 20-Feb-1945 Gender: Female Account #: [id number] Procedure:                Upper GI endoscopy with snare polypectomy; Maloney                            dilation. 56 [place] Indications:              Therapeutic procedure, Dysphagia Medicines:                Monitored Anesthesia Care Procedure:                Pre-Anesthesia Assessment:                           - Prior to the procedure, a History and Physical                            was performed, and patient medications and                            allergies were reviewed. The patient's tolerance of                            previous anesthesia was also reviewed. The risks                            and benefits of the procedure and the sedation                            options and risks were discussed with the patient.                            All questions were answered, and informed consent                            was obtained. Prior Anticoagulants: The patient has                            taken Eliquis (apixaban), last dose was 3 days                            prior to procedure. ASA Grade Assessment: III - A                            patient with severe systemic disease. After                            reviewing the risks and benefits, the patient was                            deemed in satisfactory condition to undergo the  procedure.                           After obtaining informed consent, the endoscope was                            passed under direct vision. Throughout the                            procedure, the patient's blood pressure, pulse, and                            oxygen saturations were monitored continuously. The                            Endoscope was introduced through the mouth, and                    advanced to the second part of duodenum. The upper                            GI endoscopy was accomplished without difficulty.                            The patient tolerated the procedure well. Scope In: Scope Out: Findings:                 A large caliber Schatzki ring was found at the                            gastroesophageal junction. The esophagus was                            otherwise normal. After completing the endoscopic                            survey 54 [place] Maloney dilator was passed blindly                            into the esophagus without resistance or heme.                            Tolerated well.                           Multiple pedunculated nonbleeding benign fundic                            gland type polyps were noted in the gastric body.                            115 mm pedunculated polyp in the body of the                            stomach was spontaneously oozing. The polyp was  removed with a hot snare. Resection and retrieval                            were complete. The polypectomy site was clean.                           The stomach was normal except for a small hiatal                            hernia.                           The examined duodenum was normal.                           The cardia and gastric fundus were normal on                            retroflexion. Complications:            No immediate complications. Estimated Blood Loss:     Estimated blood loss: none. Impression:               - Mild Schatzki ring. Dilated.                           - Multiple gastric polyps. 1 bleeding polyp was                            resected and retrieved.                           - Normal stomach otherwise.                           - Normal examined duodenum. Recommendation:           1. Post dilation diet                           2. Resume previous medications                           3.  Resume Eliquis today                           4. Follow-up pathology                           5. GI follow-up as needed. Docia Chuck. Henrene Pastor, MD 02/07/2019 8:58:42 AM This report has been signed electronically.

## 2019-02-09 ENCOUNTER — Telehealth: Payer: Self-pay

## 2019-02-09 NOTE — Telephone Encounter (Signed)
  Follow up Call-  Call back number 02/07/2019 01/27/2017  Post procedure Call Back phone  # [phone]  Permission to leave phone message Yes Yes  Some recent data might be hidden     Patient questions:  Do you have a fever, pain , or abdominal swelling? No. Pain Score  0 *  Have you tolerated food without any problems? Yes.    Have you been able to return to your normal activities? Yes.    Do you have any questions about your discharge instructions: Diet   No. Medications  No. Follow up visit  No.  Do you have questions or concerns about your Care? No.  Actions: * If pain score is 4 or above: No action needed, pain <4.  1. Have you developed a fever since your procedure? no  2.   Have you had an respiratory symptoms (SOB or cough) since your procedure? no  3.   Have you tested positive for COVID 19 since your procedure no  4.   Have you had any family members/close contacts diagnosed with the COVID 19 since your procedure?  no   If yes to any of these questions please route to Joylene John, RN and Alphonsa Gin, Therapist, sports.

## 2019-02-12 ENCOUNTER — Encounter: Payer: Self-pay | Admitting: Internal Medicine

## 2019-02-12 ENCOUNTER — Other Ambulatory Visit: Payer: Self-pay

## 2019-02-12 ENCOUNTER — Telehealth: Payer: Self-pay | Admitting: Internal Medicine

## 2019-02-12 ENCOUNTER — Ambulatory Visit: Payer: PPO | Admitting: Internal Medicine

## 2019-02-12 VITALS — BP 128/74 | HR 77 | Ht <= 58 in | Wt 144.2 lb

## 2019-02-12 DIAGNOSIS — R0600 Dyspnea, unspecified: Secondary | ICD-10-CM

## 2019-02-12 DIAGNOSIS — R0689 Other abnormalities of breathing: Secondary | ICD-10-CM | POA: Diagnosis not present

## 2019-02-12 NOTE — Patient Instructions (Addendum)
  Shortness of breath is due to weight and heart muscle stiffness and physical deconditioning Glad things are stable in the last 1 year subjectively but you stopped sooner with the wal  Plan  -  Do HRCT chest when available and poosible next few weeks  - to be on safe side bcuase you stopped walking much earlier than 2019 - will call with results     Follwup  - Return in 1 year for follow-up or sooner if needed

## 2019-02-12 NOTE — Progress Notes (Signed)
OV 12/31/2015  Chief Complaint  Patient presents with  . Follow-up    Pt states her breathing is unchanged since last OV. Pt states infrequent dry cough and wheezing. Pt denies CP/tightness, f/c/s.     OV 02/17/2015  Chief Complaint  Patient presents with  . Follow-up    Pt here after lung biospy. Pt c/o right lateral chest pain from the surgery, c/o chest congestion with non prod cough. Pt states her SOB is unchanged since last OV.    74 year old obese woman referred for pulm fibrosis. Hx from patient (poor hx) and chart and husband.  Office visit 11/22/2014: Dyspnea - insidious onset x several years. Progressive steadily x few years. Severe. Brought on with ltd exertion like ADLs. Relieved by rest. No associated cough or wheeze. ECHO 10/15/14 - gr1 diast dysfn and per husband recently started on lasix without improvement. Had CTA 11/9/176 - PE ruled out but per report CT shows  ILD.  Non specific scar changes on most recent CT and new RUL nodule in 2015 and 2016.   PFT 11/18/14 shows restriction with low DLCO - suggestive of ILD - FVC 70%, TLC 69%, DLCO 52% (BMI 29.67 kg/(m^2)).   Walking desat test 185 feet x 3 laps - stopped due to dyspnea prematurely at 2 laps. Also fatigue - did not desat at 2 laps.  02/17/15: Underwent right VATS, mini thoracotomy for open lung biopsy 01/24/2015 with Dr. Prescott Gum. Path with focal mild nonspecific chronic bronchiolitis and pleuritis with granulomatous features. Dyspnea improved since surgery but not back to baseline. Cough with chest congestion but unable to bring up. Pain improved from surgery. No fevers or chills. Occasional chronic chest pain. Some wheezing. She uses 2L O2 at night.   OV 05/20/2015  Chief Complaint  Patient presents with  . Follow-up    Pt states she feels her breathing has worsened since last OV. Pt c/o slight increase in SOB with activity, mild dry cough and has occassional chest discomfort - this resolves with rest.      Follow-up dyspnea in the setting of restrictive PFTs and ILD pattern and CT chest. Surgical lung biopsy without specific ILD diagnosis  After last visit I referred her to pulmonary habitation which she says has helped her dyspnea. She did see [facility] Dr. Lauris Chroman in April 2017 and apparently has been reassured. I reviewed his notes and he did want to review her biopsy of the multidisciplinary thoracic ILD clinic. I do not have the conclusion of this but it appears from the patient and her husband who both are poor historians that they were reassured. They were even told that she might have some emphysema. In February 2017 she had admission for nonspecific weakness. At discharge she was given some albuterol which apparently helped. She is wanting to try the same  There are no other new issues.    OV 08/25/2015  Chief Complaint  Patient presents with  . Follow-up    Pt states that she does not feel she is improving. Pt has been exercising 3 days a week. Pt c/o an increase in SOB and wheeze with exertion. Pt denies cough/cp/tightness.     Follow-up dyspnea in the setting of restrictive PFTs and nocturnal desaturation with 2016 CT suggestive of ILD surgical lung biopsy without specific diagnosis.  She returns with her husband. In April 2017 basal ILD clinic at [facility] and reassured. Since then she is attended pulmonary rehabilitation he did  she tells me this does not help. She is currently working out a [employer] and this is not helping her. She still dyspneic with exertion relieved by rest. She did try Brio inhaler that I gave her and this did not help her either. There are no other new symptoms. Review of the chart shows she has chronic anemia hemoglobin 9 g percent at least since January 2017. A year ago she was a little bit higher than that. Most recently apparently primary care physician checked her blood hemoglobin and it was low at 9 g percent. She does not  know the cause. She never seen hematology. She was blood transfusions a few years ago. Let us high-resolution CT chest August 2017 read by thoracic radiologist suggested is no ILD anymore.  ONO  - positive for desats on RA - and started on 1 L [place] after this visit  OV 12/31/2015  Chief Complaint  Patient presents with  . Follow-up    Pt states her breathing is unchanged since last OV. Pt states infrequent dry cough and wheezing. Pt denies CP/tightness, f/c/s.      Follow-up dyspnea in the setting of restrictive PFTs and nocturnal desaturation with 2016 CT suggestive of ILD surgical lung biopsy - unclassifabile mil;d ILD without specific diagnosis Jan 2017 and fu CT clear august 2017  At last visit in August 2017 she still had dyspnea with the CT chest showed no interstitial lung disease. Overnight desaturation test then showed mild hypoxemia at night. We started on 1 L oxygen which she says seems to help but she is noncompliant with this. In addition I found her to be anemic with a hemoglobin less than 9 g percent in February 2017. She then followed up with a primary care physician and renal physician who has started on iron therapy. She tells me that August 2017 hemoglobin was 9 g percent. Since then after being on iron tablets hemoglobin check with her primary care physician a few weeks ago is greater than 10 g percent according to her history. I do not have those results with him. She does me despite improvement in hemoglobin her dyspnea persists. She is unable to take a shower without resting because of dyspnea. She is unable to climb a flight of stairs but she can change her clothes. Associated with the dyspnea is exertional wheeze. This no resting wheeze is no nocturnal awakening. This no edema this no chest tightness is no fever or chills. She continues to be obese and physically deconditioned.  Exhaled nitric oxide done today for the first time 12/31/2015 -> 6 ppff and normal [noted in the  past that inhaler therapy did not help her]    in past exercise therapy did not help her.   OV 02/12/2016  Chief Complaint  Patient presents with  . Follow-up    Pt here CPST. Pt denies change in SOB and wheezing since last OV and also c/o midsternal CP, pt had a stress test with cards, per pt this was normal. Pt denies cough.   Follow-up dyspnea  Here for a pulmonary stress test done 01/26/2016 off note she did have a normal nuclear medicine cardiac stress test 02/04/2016  Pulmonary stress test shows good respiratory exchange ratio of 1.129 maximal effort. Heart aerobic capacity is significantly reduced. The reason for this appears to be chronotropic incompetence along with physical deconditioning. Her Cardizem was changed to amlodipine but this has not helped her dyspnea. She is now telling me that in the past  exercise therapy helped and she wants referral to pulmonary rehabilitation. Medication review shows that she is on both metoprolol and amlodipine. Husband says this is because of previous tachycardia. She also tells me that she does wheeze after shower but in the past exhaled nitric oxide was normal  OV 05/11/2016  Chief Complaint  Patient presents with  . Follow-up    Pt currently going to pulmonary rehab. Pt states her breathing is unchanged since last OV. Pt denies cough and f/c/s. Pt states she has occassional CP when active - resolves with rest.    Walked in to see [name] and she got call about her borther in law Althia Forts who was my ICU patient last week. Now in floor. FOundwith hemateemis and had cpr and passed away/ D/w resident Dr Jari Favre. Office visit   OV 05/19/2016  Chief Complaint  Patient presents with  . Follow-up    Pt here after pt had to leave suddenly during 05/11/2016 OV d/t a loss of family memeber. Pt is now here to cotinue the visit from 05/11/2016.     Follow-up dyspnea due to chronotropic incompetence and obesity  Last visit I referred her to  pulmonary rehabilitation which she is attending but she tells me this does not help. I coordinated with cardiology and had to stop one of her cardiac medications but this made him more tachycardic. She is therefore back on beta blocker. Med review shows she is on bisoprolol and Toprol. At this point in time she is resigned to having dyspnea it is stable. She prefers to get out of pulmonary rehabilitation work out on her own at fitness. There are no new issues.  OV 05/19/2017  Chief Complaint  Patient presents with  . Follow-up    Pt states she has been doing good since last visit and denies any complaints or concerns.   [name] returns for follow-up of dyspnea due to chronotropic incompetence and obesity and physical deconditioning  It has been 1 year since I last saw her.  In the last 1 year there is been no interim change.  Her health is stable.  She says no changes in medication.  No visits to urgent care or emergency department or changes to health status.  Dyspnea persist.  She is not exercising regularly on her own.  Walking desaturation test today was fine        OV 02/12/2019  Subjective:  Patient ID: [name], female , DOB: 1945-08-29 , age 18 y.o. , MRN: [id number] , ADDRESS: [place] [id number]   02/12/2019 -   Chief Complaint  Patient presents with  . Follow-up    Last seen 05/2017.  Pt states she has been doing okay since last visit. states she will still become SOB.   Follow-up of dyspnea due to chronotropic incompetence and obesity and physical deconditioning.  Previously thought to have ILD but she did not.    HPI [name] 75 y.o. -it is almost been 2 years since I last saw [name].  In the intervening time the COVID-19 pandemic prevented her from having a visit.  She says overall she is stable no new issues.  Her symptom and walk test is listed below.  Her last CT scan was in 2019.  In a walk test today she stopped much for more  prematurely finishing only 1 out of 3 laps and got quite tachycardic.    SYMPTOM SCALE  02/12/2019   O2  use ra  Shortness of Breath 0 -> 5 scale with 5 being worst (score 6 If unable to do)  At rest 0  Simple tasks - showers, clothes change, eating, shaving 3  Household (dishes, doing bed, laundry) 3  Shopping 3  Walking level at own pace 3  Walking up Stairs 2  Total (30-36) Dyspnea Score 14  How bad is your cough? 1  How bad is your fatigue 3  How bad is nausea 0  How bad is vomiting?  0  How bad is diarrhea? 1  How bad is anxiety? 3  How bad is depression 3          Simple office walk 185 feet x  3 laps goal with forehead probe 05/19/2017  02/12/2019   O2 used Room air Room air  Number laps completed 3 3 aimed but stopped at 1  Comments about pace nomral per CMA Slow pace  Resting Pulse Ox/HR 97% and 72/min 99% and 73/minb  Final Pulse Ox/HR 96% and 106/min 98% and 98/min  Desaturated </= 88% no no  Desaturated <= 3% points no no  Got Tachycardic >/= 90/min yes yes  Symptoms at end of test none Moderate dyspnea = stopped early  Miscellaneous comments none     ROS - per HPI     has a past medical history of Anxiety, Arthritis, Blood transfusion without reported diagnosis, Carpal tunnel syndrome, bilateral (07/31/2015), Cataract, Cervical spondylosis without myelopathy, Chronic chest pain, Chronic kidney disease, Chronic low back pain, Chronotropic incompetence, CKD (chronic kidney disease), stage III, Coronary artery disease, Depression, DVT (deep venous thrombosis) ([place]), Esophageal stricture, GERD (gastroesophageal reflux disease), Gross hematuria, Heart murmur, History of esophageal dilatation, History of gout, History of hiatal hernia, History of kidney stones, Hyperlipidemia, Hypertension, Hypothyroidism, Internal hemorrhoids, Irritable bowel syndrome, Mild aortic stenosis, Myocardial infarction ([place]), Obesity, Orthostatic hypotension, Partial seizure disorder ([place])  (NEUROLOGIST-- DR Jannifer Franklin), PONV (postoperative nausea and vomiting), Pulmonary fibrosis ([place]), S/P pericardial cyst excision, Seizures ([place]), SVT (supraventricular tachycardia) ([place]), Trigger finger, acquired (09/30/2015), and Vitamin D deficiency.   reports that she quit smoking about 51 years ago. Her smoking use included cigarettes. She started smoking about 54 years ago. She has a 0.90 pack-year smoking history. She has never used smokeless tobacco.  Past Surgical History:  Procedure Laterality Date  . BIOPSY OF MEDIASTINAL MASS N/A 02/05/2013   Procedure: RESECTION OF MEDIASTINAL MASS;  Surgeon: Ivin Poot, MD;  Location: [facility];  Service: Thoracic;  Laterality: N/A;  . CARDIAC CATHETERIZATION Right 01/24/2015   Procedure: right femoral ARTERIAL LINE INSERTION;  Surgeon: Ivin Poot, MD;  Location: [facility];  Service: Thoracic;  Laterality: Right;  . CARDIOVASCULAR STRESS TEST  11-22-2013  dr Irish Lack   normal lexiscan study/  no ischemia/  normal LVF and wall motion/ ef 81%  . COLONOSCOPY  last one 2014  . CORONARY ANGIOGRAM  03/10/2011   Procedure: CORONARY ANGIOGRAM;  Surgeon: Jettie Booze, MD;  Location: [facility] CATH LAB;  Service: Cardiovascular;;  . CORONARY ANGIOPLASTY WITH STENT PLACEMENT  11-22-2009  dr Irish Lack   Acute inferoposterior MI/  ef 60%,  PCI with DES x1 to dRCA,  25%  mLAD  . CORONARY ANGIOPLASTY WITH STENT PLACEMENT  06-26-2010  dr Daneen Schick   Cutting balloon angioplasty to dRCA with DES x1,  40% mLAD (non-obstructive CAD)  . CYSTOSCOPY WITH RETROGRADE PYELOGRAM, URETEROSCOPY AND STENT PLACEMENT Right 02/13/2014   Procedure: CYSTOSCOPY WITH RETROGRADE PYELOGRAM, URETEROSCOPY AND STENT PLACEMENT;  Surgeon: Bernestine Amass, MD;  Location: [facility];  Service: Urology;  Laterality: Right;  . ECTOPIC PREGNANCY SURGERY  YRS AGO   SALPINGECTOMY  . ESOPHAGOGASTRODUODENOSCOPY N/A 02/15/2014   Procedure: ESOPHAGOGASTRODUODENOSCOPY (EGD);  Surgeon: Jerene Bears, MD;  Location: [facility];  Service: Endoscopy;  Laterality: N/A;  . ESOPHAGOGASTRODUODENOSCOPY (EGD) WITH ESOPHAGEAL DILATION  05-20-2011  . HOLMIUM LASER APPLICATION Right [id number]   Procedure: HOLMIUM LASER APPLICATION;  Surgeon: Bernestine Amass, MD;  Location[facility];  Service: Urology;  Laterality: Right;  . LEFT HEART CATHETERIZATION WITH CORONARY ANGIOGRAM N/A 03/14/2012   Procedure: LEFT HEART CATHETERIZATION WITH CORONARY ANGIOGRAM;  Surgeon: Sueanne Margarita, MD;  Location: [facility] CATH LAB;  Service: Cardiovascular;  Laterality: N/A;  Normal LM,  50% pLAD,  70% mLAD,  D2 50-70%, very tortuous LAD,  70-82% ostial LCFX,  50% in-stent restenosis of  dRCA and mRCA  stent and 50-70% ostial PDA,  normal LVSF, ef 60%  . LUNG BIOPSY Right 01/24/2015   Procedure: RIGHT LUNG BIOPSY;  Surgeon: Ivin Poot, MD;  Location: [facility];  Service: Thoracic;  Laterality: Right;  . MEDIASTERNOTOMY N/A 02/05/2013   Procedure: MEDIAN STERNOTOMY;  Surgeon: Ivin Poot, MD;  Location: [facility];  Service: Thoracic;  Laterality: N/A;  . MEDIASTERNOTOMY Left 02/05/2013  . NEEDLE GUIDED EXCISION BREAST CALCIFICATIONS Right 08-09-2008  . PERCUTANEOUS CORONARY STENT INTERVENTION (PCI-S) N/A 04/03/2012   Procedure: PERCUTANEOUS CORONARY STENT INTERVENTION (PCI-S);  Surgeon: Jettie Booze, MD;  Location: [facility] CATH LAB;  Service: Cardiovascular;  Laterality: N/A;   Successful PCI  mLAD with 2.75x12 Promus stent, postdilated to >0.43mm  . THORACOTOMY Right 01/24/2015   Procedure: RIGHT MINI/LIMITED THORACOTOMY;  Surgeon: Ivin Poot, MD;  Location: [facility];  Service: Thoracic;  Laterality: Right;  . TRANSTHORACIC ECHOCARDIOGRAM  11-17-2011   grade I diastolic dysfunction/  ef 55-60%  . VIDEO BRONCHOSCOPY N/A 01/24/2015   Procedure: RIGHT VIDEO BRONCHOSCOPY;  Surgeon: Ivin Poot, MD;  Location: [facility] OR;  Service: Thoracic;  Laterality: N/A;    No Known Allergies  Immunization History    Administered Date(s) Administered  . Influenza, High Dose Seasonal PF 11/15/2009, 10/11/2012, 09/23/2014, 10/12/2014, 10/12/2015, 10/11/2016, 09/14/2017, 09/12/2018  . Influenza,inj,Quad PF,6+ Mos 09/23/2014  . Influenza,inj,quad, With Preservative 10/11/2016  . Influenza-Unspecified 10/11/2012  . Pneumococcal-Unspecified 11/15/2009  . Zoster 02/07/2012  . Zoster Recombinat (Shingrix) 10/20/2018    Family History  Problem Relation Age of Onset  . Breast cancer Sister   . Heart disease Father   . Hypertension Father   . Emphysema Father   . Coronary artery disease Father   . Diabetes Mellitus I Father   . Heart attack Mother   . CVA Mother   . Breast cancer Other        niece  . Colon cancer Neg Hx   . Stomach cancer Neg Hx   . Esophageal cancer Neg Hx   . Rectal cancer Neg Hx      Current Outpatient Medications:  .  ALPRAZolam (XANAX) 0.25 MG tablet, Take 0.25 mg by mouth 3 (three) times daily. , Disp: , Rfl:  .  aspirin EC 81 MG tablet, Take 81 mg by mouth daily., Disp: , Rfl:  .  calcitRIOL (ROCALTROL) 0.25 MCG capsule, Take 0.25 mcg by mouth daily. , Disp: , Rfl:  .  [employer] D3 50 MCG (2000 UT) CAPS, Take 2,000 Units by mouth daily., Disp: , Rfl:  .  diltiazem (  CARDIZEM CD) 120 MG 24 hr capsule, TAKE 1 CAPSULE BY MOUTH EVERY DAY, Disp: 90 capsule, Rfl: 2 .  DULoxetine (CYMBALTA) 60 MG capsule, Take 60 mg by mouth daily., Disp: , Rfl:  .  ferrous sulfate 325 (65 FE) MG tablet, Take 325 mg by mouth daily with breakfast. , Disp: , Rfl:  .  HYDROcodone-acetaminophen (NORCO) 10-325 MG tablet, Take by mouth., Disp: , Rfl:  .  levETIRAcetam (KEPPRA) 500 MG tablet, TAKE 1 TABLET BY MOUTH TWICE A DAY, Disp: 180 tablet, Rfl: 4 .  levothyroxine (SYNTHROID) 100 MCG tablet, Take 100 mcg by mouth daily before breakfast., Disp: , Rfl:  .  Magnesium Chloride 64 MG TBEC, Take 1 tablet by mouth 2 (two) times daily., Disp: , Rfl:  .  nebivolol (BYSTOLIC) 2.5 MG tablet, Take 2.5 mg by mouth  daily. , Disp: , Rfl:  .  nitroGLYCERIN (NITROSTAT) 0.4 MG SL tablet, PLACE 1 TABLET (0.4 MG TOTAL) UNDER THE TONGUE EVERY 5 (FIVE) MINUTES AS NEEDED FOR CHEST PAIN., Disp: 25 tablet, Rfl: 3 .  pantoprazole (PROTONIX) 40 MG tablet, TAKE 1 TABLET BY MOUTH TWICE A DAY, Disp: 180 tablet, Rfl: 2 .  potassium chloride SA (K-DUR,KLOR-CON) 20 MEQ tablet, Take 1 tablet by mouth daily., Disp: , Rfl:  .  rosuvastatin (CRESTOR) 20 MG tablet, TAKE 1 TABLET BY MOUTH EVERY DAY IN THE MORNING, Disp: 90 tablet, Rfl: 3      Objective:   Vitals:   02/12/19 1015  BP: 128/74  Pulse: 77  SpO2: 97%  Weight: 144 lb 3.2 oz (65.4 kg)  Height: 4\' 10"  (1.473 m)    Estimated body mass index is 30.14 kg/m as calculated from the following:   Height as of this encounter: 4\' 10"  (1.473 m).   Weight as of this encounter: 144 lb 3.2 oz (65.4 kg).  @WEIGHTCHANGE @  [employer]   02/12/19 1015  Weight: 144 lb 3.2 oz (65.4 kg)     Physical Exam  Deconditioned obese female.  Slow on her feet.  Looking stable without any distress.  Good historian.  Clear to auscultation bilaterally normal heart sounds abdomen soft no edema alert and oriented x3       Assessment:       ICD-10-CM   1. Dyspnea and respiratory abnormality  R06.00    R06.89        Plan:     Patient Instructions   Shortness of breath is due to weight and heart muscle stiffness and physical deconditioning Glad things are stable in the last 1 year  Plan -Consider COVID-19 vaccine when available especially if you do not have history of any allergies particularly to injectables or other vaccines. - do symptom score sheet 02/12/2019 (also one for your husband)  - do simple walk test in office 02/12/2019  -We will continue to keep an eye on you.     Follwup  - Return in 1 year for follow-up or sooner if needed - can consider CT chest at followup if needed    ( Level 03: Esbt 20-29 min visit spent in visit type: on-site physical face to  visit in total care time and counseling or/and coordination of care by this undersigned MD - Dr Brand Males. This includes one or more of the following all delivered on this same day 02/12/2019: pre-charting, chart review, note writing, documentation discussion of test results, diagnostic or treatment recommendations, prognosis, risks and benefits of management options, instructions, education, compliance or risk-factor reduction. It  excludes time spent by the [place] or office staff in the care of the patient. Actual time was 83)   SIGNATURE    Dr. Brand Males, M.D., F.C.C.P,  Pulmonary and Critical Care Medicine Staff Physician, [place] Director - Interstitial Lung Disease  Program  Pulmonary [place] at [place], [place], [address]  Pager: [phone], If no answer or between  15:00h - 7:00h: call [phone] Telephone: [phone]  10:48 AM 02/12/2019

## 2019-02-17 ENCOUNTER — Ambulatory Visit: Payer: PPO | Attending: Internal Medicine

## 2019-02-17 DIAGNOSIS — Z23 Encounter for immunization: Secondary | ICD-10-CM

## 2019-02-17 NOTE — Progress Notes (Signed)
   Covid-19 Vaccination Clinic  Name:  [name]    MRN: [id number] DOB: 07-Feb-1945  02/17/2019  [name] was observed post Covid-19 immunization for 15 minutes without incidence. She was provided with Vaccine Information Sheet and instruction to access the V-Safe system.   [name] was instructed to call 911 with any severe reactions post vaccine: Marland Kitchen Difficulty breathing  . Swelling of your face and throat  . A fast heartbeat  . A bad rash all over your body  . Dizziness and weakness    Immunizations Administered    Name Date Dose VIS Date Route   Pfizer COVID-19 Vaccine 02/17/2019  1:09 PM 0.3 mL 12/22/2018 Intramuscular   Manufacturer: [place]   Lot: [id number]   [place]: [id number]

## 2019-02-19 ENCOUNTER — Ambulatory Visit (INDEPENDENT_AMBULATORY_CARE_PROVIDER_SITE_OTHER)
Admission: RE | Admit: 2019-02-19 | Discharge: 2019-02-19 | Disposition: A | Discharge status: Home / Self Care | Payer: PPO | Source: Ambulatory Visit | Attending: Internal Medicine | Admitting: Internal Medicine

## 2019-02-19 ENCOUNTER — Other Ambulatory Visit: Payer: Self-pay

## 2019-02-19 DIAGNOSIS — R0600 Dyspnea, unspecified: Secondary | ICD-10-CM

## 2019-02-19 DIAGNOSIS — R0689 Other abnormalities of breathing: Secondary | ICD-10-CM

## 2019-02-19 DIAGNOSIS — R0602 Shortness of breath: Secondary | ICD-10-CM | POA: Diagnosis not present

## 2019-03-05 ENCOUNTER — Ambulatory Visit: Payer: PPO

## 2019-03-08 NOTE — Progress Notes (Signed)
1. No ILD 2. Mild air trapping 3. Does she have any known liver disease? CT suggests cirrhosis (early). She should talk to PCP Carol Ada, MD

## 2019-03-12 ENCOUNTER — Telehealth: Payer: Self-pay | Admitting: Internal Medicine

## 2019-03-12 NOTE — Telephone Encounter (Signed)
Pt's ct results has been faxed to pcp for pt. Called and spoke with pt letting her know this had been done and she verbalized understanding. nothing further needed.

## 2019-03-14 ENCOUNTER — Ambulatory Visit: Payer: PPO | Attending: Internal Medicine

## 2019-03-14 DIAGNOSIS — Z23 Encounter for immunization: Secondary | ICD-10-CM | POA: Insufficient documentation

## 2019-03-14 NOTE — Progress Notes (Signed)
   Covid-19 Vaccination Clinic  Name:  SHAELAN ODOWD    MRN: [id number] DOB: 23-Jan-1945  03/14/2019  Ms. Light was observed post Covid-19 immunization for 15 minutes without incident. She was provided with Vaccine Information Sheet and instruction to access the V-Safe system.   Ms. Teschner was instructed to call 911 with any severe reactions post vaccine: Marland Kitchen Difficulty breathing  . Swelling of face and throat  . A fast heartbeat  . A bad rash all over body  . Dizziness and weakness   Immunizations Administered    Name Date Dose VIS Date Route   Pfizer COVID-19 Vaccine 03/14/2019  9:22 AM 0.3 mL 12/22/2018 Intramuscular   Manufacturer: [place]   Lot: [id number]   [place]: [id number]

## 2019-03-19 ENCOUNTER — Telehealth: Payer: Self-pay | Admitting: Internal Medicine

## 2019-03-19 DIAGNOSIS — E7800 Pure hypercholesterolemia, unspecified: Secondary | ICD-10-CM | POA: Diagnosis not present

## 2019-03-19 DIAGNOSIS — K745 Biliary cirrhosis, unspecified: Secondary | ICD-10-CM | POA: Diagnosis not present

## 2019-03-19 DIAGNOSIS — N183 Chronic kidney disease, stage 3 unspecified: Secondary | ICD-10-CM | POA: Diagnosis not present

## 2019-03-19 DIAGNOSIS — K219 Gastro-esophageal reflux disease without esophagitis: Secondary | ICD-10-CM | POA: Diagnosis not present

## 2019-03-19 DIAGNOSIS — I10 Essential (primary) hypertension: Secondary | ICD-10-CM | POA: Diagnosis not present

## 2019-03-19 DIAGNOSIS — I2510 Atherosclerotic heart disease of native coronary artery without angina pectoris: Secondary | ICD-10-CM | POA: Diagnosis not present

## 2019-03-19 DIAGNOSIS — E039 Hypothyroidism, unspecified: Secondary | ICD-10-CM | POA: Diagnosis not present

## 2019-03-19 DIAGNOSIS — F331 Major depressive disorder, recurrent, moderate: Secondary | ICD-10-CM | POA: Diagnosis not present

## 2019-03-19 NOTE — Telephone Encounter (Signed)
Ct chest from 02/19/19 and ov from 02/12/19 faxed to Dr Tamala Julian at the number given   I called and spoke with the pt and notified that this was done  Nothing further needed

## 2019-03-20 ENCOUNTER — Other Ambulatory Visit: Payer: Self-pay | Admitting: Family Medicine

## 2019-03-20 DIAGNOSIS — K745 Biliary cirrhosis, unspecified: Secondary | ICD-10-CM

## 2019-03-26 ENCOUNTER — Ambulatory Visit
Admission: RE | Admit: 2019-03-26 | Discharge: 2019-03-26 | Disposition: A | Discharge status: Home / Self Care | Payer: PPO | Source: Ambulatory Visit | Attending: Family Medicine | Admitting: Family Medicine

## 2019-03-26 DIAGNOSIS — K745 Biliary cirrhosis, unspecified: Secondary | ICD-10-CM

## 2019-03-26 DIAGNOSIS — K7460 Unspecified cirrhosis of liver: Secondary | ICD-10-CM | POA: Diagnosis not present

## 2019-04-10 ENCOUNTER — Other Ambulatory Visit: Payer: Self-pay | Admitting: Family Medicine

## 2019-04-10 DIAGNOSIS — E2839 Other primary ovarian failure: Secondary | ICD-10-CM

## 2019-04-11 ENCOUNTER — Other Ambulatory Visit: Payer: Self-pay | Admitting: Family Medicine

## 2019-04-11 DIAGNOSIS — Z1231 Encounter for screening mammogram for malignant neoplasm of breast: Secondary | ICD-10-CM

## 2019-04-17 ENCOUNTER — Other Ambulatory Visit: Payer: Self-pay | Admitting: Cardiology

## 2019-04-17 MED ORDER — NEBIVOLOL HCL 2.5 MG PO TABS: 2.5000 mg | ORAL_TABLET | Freq: Every day | ORAL | 2 refills | Status: DC

## 2019-05-30 DIAGNOSIS — I129 Hypertensive chronic kidney disease with stage 1 through stage 4 chronic kidney disease, or unspecified chronic kidney disease: Secondary | ICD-10-CM | POA: Diagnosis not present

## 2019-05-30 DIAGNOSIS — N183 Chronic kidney disease, stage 3 unspecified: Secondary | ICD-10-CM | POA: Diagnosis not present

## 2019-06-04 DIAGNOSIS — M9080 Osteopathy in diseases classified elsewhere, unspecified site: Secondary | ICD-10-CM | POA: Diagnosis not present

## 2019-06-04 DIAGNOSIS — N183 Chronic kidney disease, stage 3 unspecified: Secondary | ICD-10-CM | POA: Diagnosis not present

## 2019-06-04 DIAGNOSIS — E889 Metabolic disorder, unspecified: Secondary | ICD-10-CM | POA: Diagnosis not present

## 2019-06-04 DIAGNOSIS — I129 Hypertensive chronic kidney disease with stage 1 through stage 4 chronic kidney disease, or unspecified chronic kidney disease: Secondary | ICD-10-CM | POA: Diagnosis not present

## 2019-06-04 DIAGNOSIS — E559 Vitamin D deficiency, unspecified: Secondary | ICD-10-CM | POA: Diagnosis not present

## 2019-06-13 DIAGNOSIS — R790 Abnormal level of blood mineral: Secondary | ICD-10-CM | POA: Diagnosis not present

## 2019-06-13 DIAGNOSIS — R531 Weakness: Secondary | ICD-10-CM | POA: Diagnosis not present

## 2019-06-29 ENCOUNTER — Ambulatory Visit
Admission: RE | Admit: 2019-06-29 | Discharge: 2019-06-29 | Disposition: A | Discharge status: Home / Self Care | Payer: PPO | Source: Ambulatory Visit | Attending: Family Medicine | Admitting: Family Medicine

## 2019-06-29 ENCOUNTER — Other Ambulatory Visit: Payer: Self-pay

## 2019-06-29 DIAGNOSIS — Z1231 Encounter for screening mammogram for malignant neoplasm of breast: Secondary | ICD-10-CM | POA: Diagnosis not present

## 2019-06-29 DIAGNOSIS — Z780 Asymptomatic menopausal state: Secondary | ICD-10-CM | POA: Diagnosis not present

## 2019-06-29 DIAGNOSIS — E2839 Other primary ovarian failure: Secondary | ICD-10-CM

## 2019-06-29 DIAGNOSIS — M85852 Other specified disorders of bone density and structure, left thigh: Secondary | ICD-10-CM | POA: Diagnosis not present

## 2019-07-06 DIAGNOSIS — D649 Anemia, unspecified: Secondary | ICD-10-CM | POA: Diagnosis not present

## 2019-07-06 DIAGNOSIS — D508 Other iron deficiency anemias: Secondary | ICD-10-CM | POA: Diagnosis not present

## 2019-07-06 DIAGNOSIS — E039 Hypothyroidism, unspecified: Secondary | ICD-10-CM | POA: Diagnosis not present

## 2019-07-06 DIAGNOSIS — G4700 Insomnia, unspecified: Secondary | ICD-10-CM | POA: Diagnosis not present

## 2019-07-06 DIAGNOSIS — F329 Major depressive disorder, single episode, unspecified: Secondary | ICD-10-CM | POA: Diagnosis not present

## 2019-07-06 DIAGNOSIS — N183 Chronic kidney disease, stage 3 unspecified: Secondary | ICD-10-CM | POA: Diagnosis not present

## 2019-07-06 DIAGNOSIS — I10 Essential (primary) hypertension: Secondary | ICD-10-CM | POA: Diagnosis not present

## 2019-07-06 DIAGNOSIS — F324 Major depressive disorder, single episode, in partial remission: Secondary | ICD-10-CM | POA: Diagnosis not present

## 2019-07-06 DIAGNOSIS — I25119 Atherosclerotic heart disease of native coronary artery with unspecified angina pectoris: Secondary | ICD-10-CM | POA: Diagnosis not present

## 2019-07-06 DIAGNOSIS — E7800 Pure hypercholesterolemia, unspecified: Secondary | ICD-10-CM | POA: Diagnosis not present

## 2019-07-06 DIAGNOSIS — I2510 Atherosclerotic heart disease of native coronary artery without angina pectoris: Secondary | ICD-10-CM | POA: Diagnosis not present

## 2019-07-06 DIAGNOSIS — F331 Major depressive disorder, recurrent, moderate: Secondary | ICD-10-CM | POA: Diagnosis not present

## 2019-08-02 DIAGNOSIS — F324 Major depressive disorder, single episode, in partial remission: Secondary | ICD-10-CM | POA: Diagnosis not present

## 2019-08-02 DIAGNOSIS — I10 Essential (primary) hypertension: Secondary | ICD-10-CM | POA: Diagnosis not present

## 2019-08-02 DIAGNOSIS — E039 Hypothyroidism, unspecified: Secondary | ICD-10-CM | POA: Diagnosis not present

## 2019-08-02 DIAGNOSIS — N183 Chronic kidney disease, stage 3 unspecified: Secondary | ICD-10-CM | POA: Diagnosis not present

## 2019-08-02 DIAGNOSIS — F331 Major depressive disorder, recurrent, moderate: Secondary | ICD-10-CM | POA: Diagnosis not present

## 2019-08-02 DIAGNOSIS — G4700 Insomnia, unspecified: Secondary | ICD-10-CM | POA: Diagnosis not present

## 2019-08-02 DIAGNOSIS — D649 Anemia, unspecified: Secondary | ICD-10-CM | POA: Diagnosis not present

## 2019-08-02 DIAGNOSIS — I2510 Atherosclerotic heart disease of native coronary artery without angina pectoris: Secondary | ICD-10-CM | POA: Diagnosis not present

## 2019-08-02 DIAGNOSIS — F329 Major depressive disorder, single episode, unspecified: Secondary | ICD-10-CM | POA: Diagnosis not present

## 2019-08-02 DIAGNOSIS — D508 Other iron deficiency anemias: Secondary | ICD-10-CM | POA: Diagnosis not present

## 2019-08-02 DIAGNOSIS — I25119 Atherosclerotic heart disease of native coronary artery with unspecified angina pectoris: Secondary | ICD-10-CM | POA: Diagnosis not present

## 2019-08-02 DIAGNOSIS — E7800 Pure hypercholesterolemia, unspecified: Secondary | ICD-10-CM | POA: Diagnosis not present

## 2019-08-03 DIAGNOSIS — L821 Other seborrheic keratosis: Secondary | ICD-10-CM | POA: Diagnosis not present

## 2019-08-03 DIAGNOSIS — D2262 Melanocytic nevi of left upper limb, including shoulder: Secondary | ICD-10-CM | POA: Diagnosis not present

## 2019-08-03 DIAGNOSIS — D2271 Melanocytic nevi of right lower limb, including hip: Secondary | ICD-10-CM | POA: Diagnosis not present

## 2019-08-03 DIAGNOSIS — L720 Epidermal cyst: Secondary | ICD-10-CM | POA: Diagnosis not present

## 2019-08-03 DIAGNOSIS — D224 Melanocytic nevi of scalp and neck: Secondary | ICD-10-CM | POA: Diagnosis not present

## 2019-08-03 DIAGNOSIS — D225 Melanocytic nevi of trunk: Secondary | ICD-10-CM | POA: Diagnosis not present

## 2019-08-03 DIAGNOSIS — D2261 Melanocytic nevi of right upper limb, including shoulder: Secondary | ICD-10-CM | POA: Diagnosis not present

## 2019-08-03 DIAGNOSIS — D2221 Melanocytic nevi of right ear and external auricular canal: Secondary | ICD-10-CM | POA: Diagnosis not present

## 2019-08-03 DIAGNOSIS — D485 Neoplasm of uncertain behavior of skin: Secondary | ICD-10-CM | POA: Diagnosis not present

## 2019-08-11 ENCOUNTER — Other Ambulatory Visit: Payer: Self-pay | Admitting: Cardiology

## 2019-08-27 DIAGNOSIS — I10 Essential (primary) hypertension: Secondary | ICD-10-CM | POA: Diagnosis not present

## 2019-08-27 DIAGNOSIS — N183 Chronic kidney disease, stage 3 unspecified: Secondary | ICD-10-CM | POA: Diagnosis not present

## 2019-08-27 DIAGNOSIS — G4700 Insomnia, unspecified: Secondary | ICD-10-CM | POA: Diagnosis not present

## 2019-08-27 DIAGNOSIS — F329 Major depressive disorder, single episode, unspecified: Secondary | ICD-10-CM | POA: Diagnosis not present

## 2019-08-27 DIAGNOSIS — F331 Major depressive disorder, recurrent, moderate: Secondary | ICD-10-CM | POA: Diagnosis not present

## 2019-08-27 DIAGNOSIS — D508 Other iron deficiency anemias: Secondary | ICD-10-CM | POA: Diagnosis not present

## 2019-08-27 DIAGNOSIS — D649 Anemia, unspecified: Secondary | ICD-10-CM | POA: Diagnosis not present

## 2019-08-27 DIAGNOSIS — I25119 Atherosclerotic heart disease of native coronary artery with unspecified angina pectoris: Secondary | ICD-10-CM | POA: Diagnosis not present

## 2019-08-27 DIAGNOSIS — E039 Hypothyroidism, unspecified: Secondary | ICD-10-CM | POA: Diagnosis not present

## 2019-08-27 DIAGNOSIS — E7800 Pure hypercholesterolemia, unspecified: Secondary | ICD-10-CM | POA: Diagnosis not present

## 2019-08-27 DIAGNOSIS — F324 Major depressive disorder, single episode, in partial remission: Secondary | ICD-10-CM | POA: Diagnosis not present

## 2019-08-27 DIAGNOSIS — I2510 Atherosclerotic heart disease of native coronary artery without angina pectoris: Secondary | ICD-10-CM | POA: Diagnosis not present

## 2019-08-29 DIAGNOSIS — I10 Essential (primary) hypertension: Secondary | ICD-10-CM | POA: Diagnosis not present

## 2019-08-30 DIAGNOSIS — I10 Essential (primary) hypertension: Secondary | ICD-10-CM | POA: Diagnosis not present

## 2019-09-04 ENCOUNTER — Other Ambulatory Visit: Payer: Self-pay

## 2019-09-04 ENCOUNTER — Other Ambulatory Visit: Payer: Self-pay | Admitting: Cardiology

## 2019-09-04 ENCOUNTER — Encounter: Payer: Self-pay | Admitting: Cardiology

## 2019-09-04 ENCOUNTER — Ambulatory Visit: Payer: PPO | Admitting: Cardiology

## 2019-09-04 VITALS — BP 141/93 | HR 68 | Ht <= 58 in | Wt 150.0 lb

## 2019-09-04 DIAGNOSIS — I471 Supraventricular tachycardia: Secondary | ICD-10-CM | POA: Diagnosis not present

## 2019-09-04 DIAGNOSIS — I951 Orthostatic hypotension: Secondary | ICD-10-CM | POA: Diagnosis not present

## 2019-09-04 DIAGNOSIS — I10 Essential (primary) hypertension: Secondary | ICD-10-CM | POA: Diagnosis not present

## 2019-09-04 DIAGNOSIS — I2510 Atherosclerotic heart disease of native coronary artery without angina pectoris: Secondary | ICD-10-CM | POA: Diagnosis not present

## 2019-09-04 DIAGNOSIS — E7800 Pure hypercholesterolemia, unspecified: Secondary | ICD-10-CM | POA: Diagnosis not present

## 2019-09-04 DIAGNOSIS — Q248 Other specified congenital malformations of heart: Secondary | ICD-10-CM | POA: Diagnosis not present

## 2019-09-04 DIAGNOSIS — I350 Nonrheumatic aortic (valve) stenosis: Secondary | ICD-10-CM | POA: Diagnosis not present

## 2019-09-04 DIAGNOSIS — R079 Chest pain, unspecified: Secondary | ICD-10-CM

## 2019-09-04 NOTE — Progress Notes (Signed)
Cardiology Office Note:    Date:  09/04/2019   ID:  JORDANN GRIME, DOB May 12, 1945, MRN [id number]  PCP:  Carol Ada, MD  Cardiologist:  Fransico Him, MD    Referring MD: Carol Ada, MD   Chief Complaint  Patient presents with  . Coronary Artery Disease  . Hypertension  . Aortic Stenosis  . Hyperlipidemia    History of Present Illness:    JATAVIA KELTNER is a 74 y.o. female with a hx of CAD/HTN, pericardial cyst s/p resection and dyslipidemia. She has a history of chronic CP with esophageal strictures and has had esophageal dilatation.She underwent VATS procedure via mini thoracotomy syndrome with open lung bx and was diagnosed with interstitial lung disease. She has chronic CP that has been felt to be noncardiac in the past and has started to reoccur. She has chronic DOE.   She underwent a cardiopulmonary stress test by Pulmonary to evaluate her SOB and it showed deconditioning and poor HR response to exercise. At the request of the pulmonologist her CCB and BB were stoppedas they though her SOB was due to chronotropic incompetence.She then started having palpitations and was seen byher PCP,Dr. [name] was told to take bystolic PRN for palpitations. She then saw Dr. [name] who put her back on Cardizem and dropped her amlodipine to 5mg  daily.A nuclear stress test was done in January 2018 which showed no ischemia. This was done for similar chest pain that she had in the past. This chest pain occurred around the time she lost her dog.  I saw her in early August last year and she was complaining of CP which improved after increasing her nitrate to 120mg  daily. She was under a lot of stress taking care of her husband who has depression. I recommended she see her PCP to discuss treatment of her anxiety and likely depression but she did not pursue this. At that time I reassured her that CP she was having intermittently was likely related to stress but ordered a  repeat stress test which she later called and cancelled. She also had had an episode of syncope related to decreased PO intake and stopped her amlodipine, Lasix and doxazosin but continued her on Losartan 50mg  daily, Cardizem CD 240mg  daily and Bystolic 5mg  daily.   She was seen by [name], PA a week later and was still having decreased PO intake with black diarrhea and was significantly orthostatic on exam. She was weak and dizzy. She was found to be anemic and was sent to [facility] for evaluation. She was given IVF and her Bystolic was stopped and Cardizem decreased. She was seen by GI for chronic diarrhea. She was also dx with acute left peroneal vein DVT and started on Eliquis. Her husband called in on 8/15 with complaints that her dBP was elevated and she was instructed to restart her Bystolic 2.5mg  daily and change to Cardizem CD 120mg  daily.   She saw [name], PA on 09/01/2017 and had not started her Bystolic or Cardizem back and BP was elevated at 164/71mmHg. She was also complaining of bleeding gums and her Plavix was stopped and she was continued on ASA (also on DOAC for DVT). She was instructed to restart Bystolic and Cardizem and only use Lasix PRN for edema.   I saw her for televisit in August and she was still complaining of chest pain.  lexiscan myoview was done and was normal.    She is here today for followup and is doing well.  She continues to have chest pain that she has had for a long time.  She still has problems with waking up at night with burning chest discomfort and vomiting.  Her last upper EGD showed multiple gastric polyps and Schatzkis ring which was dilated.  She says that she has had 2 types of CP.  One pain is typical of what she has had with prior stenting of her heart that she thinks has gotten worse.  The other pain is related to GERD and has persisted despite recent dilation of esophagus.  She has had multiple nuclear stress tests showing no ischemia but has  known CAD.  She also complains that she is having a lot of sweating for no reason that occurs with any exertion and gets nauseated.  She denies any  SOB, DOE, PND, orthopnea, LE edema, dizziness, palpitations or syncope. She is compliant with her meds and is tolerating meds with no SE.    Past Medical History:  Diagnosis Date  . Anxiety   . Arthritis   . Blood transfusion without reported diagnosis    childhood  . Carpal tunnel syndrome, bilateral 07/31/2015  . Cataract   . Cervical spondylosis without myelopathy   . Chronic chest pain   . Chronic kidney disease   . Chronic low back pain   . Chronotropic incompetence   . CKD (chronic kidney disease), stage III   . Coronary artery disease    a. inf-post MI 2011 s/p DES to RCA. b. DES to RCA 06/2010. c. DES to LAD 2014, residual dz treated medically.  . Depression   . DVT (deep venous thrombosis) (HCC)    left  . Esophageal stricture   . GERD (gastroesophageal reflux disease)   . Gross hematuria   . Heart murmur   . History of esophageal dilatation    FOR STRIUCTURE  . History of gout   . History of hiatal hernia   . History of kidney stones   . Hyperlipidemia   . Hypertension   . Hypothyroidism   . Internal hemorrhoids   . Irritable bowel syndrome   . Mild aortic stenosis    by echo 2018  . Myocardial infarction ([place])   . Obesity   . Orthostatic hypotension   . Partial seizure disorder (HCC) NEUROLOGIST-- DR WILLIS   NOCTURNAL  . PONV (postoperative nausea and vomiting)    hard time getting iv site-had to do neck stick 2 yrs ago  . Pulmonary fibrosis ([place])   . S/P pericardial cyst excision    02-05-2013  benign  . Seizures ([place])    none in yrs  . SVT (supraventricular tachycardia) ([place])   . Trigger finger, acquired 09/30/2015   Right middle finger  . Vitamin D deficiency     Past Surgical History:  Procedure Laterality Date  . BIOPSY OF MEDIASTINAL MASS N/A 02/05/2013   Procedure: RESECTION OF MEDIASTINAL MASS;   Surgeon: Ivin Poot, MD;  Location: [facility];  Service: Thoracic;  Laterality: N/A;  . CARDIAC CATHETERIZATION Right 01/24/2015   Procedure: right femoral ARTERIAL LINE INSERTION;  Surgeon: Ivin Poot, MD;  Location: [facility];  Service: Thoracic;  Laterality: Right;  . CARDIOVASCULAR STRESS TEST  11-22-2013  dr Irish Lack   normal lexiscan study/  no ischemia/  normal LVF and wall motion/ ef 81%  . COLONOSCOPY  last one 2014  . CORONARY ANGIOGRAM  03/10/2011   Procedure: CORONARY ANGIOGRAM;  Surgeon: Jettie Booze, MD;  Location: [facility] CATH LAB;  Service: Cardiovascular;;  . CORONARY ANGIOPLASTY WITH STENT PLACEMENT  11-22-2009  dr Irish Lack   Acute inferoposterior MI/  ef 60%,  PCI with DES x1 to dRCA,  25%  mLAD  . CORONARY ANGIOPLASTY WITH STENT PLACEMENT  06-26-2010  dr Daneen Schick   Cutting balloon angioplasty to dRCA with DES x1,  40% mLAD (non-obstructive CAD)  . CYSTOSCOPY WITH RETROGRADE PYELOGRAM, URETEROSCOPY AND STENT PLACEMENT Right 02/13/2014   Procedure: CYSTOSCOPY WITH RETROGRADE PYELOGRAM, URETEROSCOPY AND STENT PLACEMENT;  Surgeon: Bernestine Amass, MD;  Location: [facility];  Service: Urology;  Laterality: Right;  . ECTOPIC PREGNANCY SURGERY  YRS AGO   SALPINGECTOMY  . ESOPHAGOGASTRODUODENOSCOPY N/A 02/15/2014   Procedure: ESOPHAGOGASTRODUODENOSCOPY (EGD);  Surgeon: Jerene Bears, MD;  Location: [facility];  Service: Endoscopy;  Laterality: N/A;  . ESOPHAGOGASTRODUODENOSCOPY (EGD) WITH ESOPHAGEAL DILATION  05-20-2011  . HOLMIUM LASER APPLICATION Right 6/0/7371   Procedure: HOLMIUM LASER APPLICATION;  Surgeon: Bernestine Amass, MD;  Location: [facility];  Service: Urology;  Laterality: Right;  . LEFT HEART CATHETERIZATION WITH CORONARY ANGIOGRAM N/A 03/14/2012   Procedure: LEFT HEART CATHETERIZATION WITH CORONARY ANGIOGRAM;  Surgeon: Sueanne Margarita, MD;  Location: [facility] CATH LAB;  Service: Cardiovascular;  Laterality: N/A;  Normal LM,  50% pLAD,  70%  mLAD,  D2 50-70%, very tortuous LAD,  70-82% ostial LCFX,  50% in-stent restenosis of  dRCA and mRCA  stent and 50-70% ostial PDA,  normal LVSF, ef 60%  . LUNG BIOPSY Right 01/24/2015   Procedure: RIGHT LUNG BIOPSY;  Surgeon: Ivin Poot, MD;  Location: [facility];  Service: Thoracic;  Laterality: Right;  . MEDIASTERNOTOMY N/A 02/05/2013   Procedure: MEDIAN STERNOTOMY;  Surgeon: Ivin Poot, MD;  Location: [facility];  Service: Thoracic;  Laterality: N/A;  . MEDIASTERNOTOMY Left 02/05/2013  . NEEDLE GUIDED EXCISION BREAST CALCIFICATIONS Right 08-09-2008  . PERCUTANEOUS CORONARY STENT INTERVENTION (PCI-S) N/A 04/03/2012   Procedure: PERCUTANEOUS CORONARY STENT INTERVENTION (PCI-S);  Surgeon: Jettie Booze, MD;  Location: [facility] CATH LAB;  Service: Cardiovascular;  Laterality: N/A;   Successful PCI  mLAD with 2.75x12 Promus stent, postdilated to >0.6mm  . THORACOTOMY Right 01/24/2015   Procedure: RIGHT MINI/LIMITED THORACOTOMY;  Surgeon: Ivin Poot, MD;  Location: [facility];  Service: Thoracic;  Laterality: Right;  . TRANSTHORACIC ECHOCARDIOGRAM  11-17-2011   grade I diastolic dysfunction/  ef 55-60%  . VIDEO BRONCHOSCOPY N/A 01/24/2015   Procedure: RIGHT VIDEO BRONCHOSCOPY;  Surgeon: Ivin Poot, MD;  Location: [facility] OR;  Service: Thoracic;  Laterality: N/A;    Current Medications: Current Meds  Medication Sig  . ALPRAZolam (XANAX) 0.25 MG tablet Take 0.25 mg by mouth 3 (three) times daily.   Marland Kitchen aspirin EC 81 MG tablet Take 81 mg by mouth daily.  . calcitRIOL (ROCALTROL) 0.25 MCG capsule Take 0.25 mcg by mouth daily.   . CVS D3 50 MCG (2000 UT) CAPS Take 2,000 Units by mouth daily.  Marland Kitchen diltiazem (CARDIZEM CD) 120 MG 24 hr capsule TAKE 1 CAPSULE BY MOUTH EVERY DAY  . DULoxetine (CYMBALTA) 60 MG capsule Take 60 mg by mouth daily.  . ferrous sulfate 325 (65 FE) MG tablet Take 325 mg by mouth daily with breakfast.   . HYDROcodone-acetaminophen (NORCO) 10-325 MG tablet Take by mouth.  .  levETIRAcetam (KEPPRA) 500 MG tablet TAKE 1 TABLET BY MOUTH TWICE A DAY  . levothyroxine (SYNTHROID) 100 MCG tablet Take 100 mcg by mouth daily before breakfast.  .  Magnesium Chloride 64 MG TBEC Take 1 tablet by mouth 2 (two) times daily.  . nebivolol (BYSTOLIC) 2.5 MG tablet Take 1 tablet (2.5 mg total) by mouth daily.  . nitroGLYCERIN (NITROSTAT) 0.4 MG SL tablet PLACE 1 TABLET (0.4 MG TOTAL) UNDER THE TONGUE EVERY 5 (FIVE) MINUTES AS NEEDED FOR CHEST PAIN.  Marland Kitchen pantoprazole (PROTONIX) 40 MG tablet TAKE 1 TABLET BY MOUTH TWICE A DAY  . potassium chloride SA (K-DUR,KLOR-CON) 20 MEQ tablet Take 1 tablet by mouth daily.  . rosuvastatin (CRESTOR) 20 MG tablet TAKE 1 TABLET BY MOUTH EVERY DAY IN THE MORNING     Allergies:   Patient has no known allergies.   Social History   Socioeconomic History  . Marital status: Married    Spouse name: Fritz Pickerel  . Number of children: 0  . Years of education: hs  . Highest education level: Not on file  Occupational History  . Occupation: Retired    Fish farm manager: RETIRED  Tobacco Use  . Smoking status: Former Smoker    Packs/day: 0.30    Years: 3.00    Pack years: 0.90    Types: Cigarettes    Start date: 11/08/1964    Quit date: 11/09/1967    Years since quitting: 51.8  . Smokeless tobacco: Never Used  Vaping Use  . Vaping Use: Never used  Substance and Sexual Activity  . Alcohol use: No    Alcohol/week: 0.0 standard drinks  . Drug use: No  . Sexual activity: Not on file  Other Topics Concern  . Not on file  Social History Narrative   Lives at home, married   Left-handed   Daily caffeine use: coffee.   Social Determinants of Health   Financial Resource Strain:   . Difficulty of Paying Living Expenses: Not on file  Food Insecurity:   . Worried About Charity fundraiser in the Last Year: Not on file  . Ran Out of Food in the Last Year: Not on file  Transportation Needs:   . Lack of Transportation (Medical): Not on file  . Lack of  Transportation (Non-Medical): Not on file  Physical Activity:   . Days of Exercise per Week: Not on file  . Minutes of Exercise per Session: Not on file  Stress:   . Feeling of Stress : Not on file  Social Connections:   . Frequency of Communication with Friends and Family: Not on file  . Frequency of Social Gatherings with Friends and Family: Not on file  . Attends Religious Services: Not on file  . Active Member of Clubs or Organizations: Not on file  . Attends Archivist Meetings: Not on file  . Marital Status: Not on file     Family History: The patient's family history includes Breast cancer in her sister and another family member; CVA in her mother; Coronary artery disease in her father; Diabetes Mellitus I in her father; Emphysema in her father; Heart attack in her mother; Heart disease in her father; Hypertension in her father. There is no history of Colon cancer, Stomach cancer, Esophageal cancer, or Rectal cancer.  ROS:   Please see the history of present illness.    ROS  All other systems reviewed and negative.   EKGs/Labs/Other Studies Reviewed:    The following studies were reviewed today: Lexiscan myoview  EKG:  EKG is not ordered today  Recent Labs: 09/13/2018: ALT 10   Recent Lipid Panel    Component Value Date/Time   CHOL 160  09/13/2018 0956   TRIG 94 09/13/2018 0956   HDL 70 09/13/2018 0956   CHOLHDL 2.3 09/13/2018 0956   CHOLHDL 1.8 06/06/2015 0928   VLDL 11 06/06/2015 0928   LDLCALC 73 09/13/2018 0956    Physical Exam:    VS:  BP (!) 141/93   Pulse 68   Ht 4\' 10"  (1.473 m)   Wt 150 lb (68 kg)   SpO2 98%   BMI 31.35 kg/m     Wt Readings from Last 3 Encounters:  09/04/19 150 lb (68 kg)  02/12/19 144 lb 3.2 oz (65.4 kg)  02/07/19 144 lb (65.3 kg)     GEN:  Well nourished, well developed in no acute distress HEENT: Normal NECK: No JVD; No carotid bruits LYMPHATICS: No lymphadenopathy CARDIAC: RRR, no murmurs, rubs,  gallops RESPIRATORY:  Clear to auscultation without rales, wheezing or rhonchi  ABDOMEN: Soft, non-tender, non-distended MUSCULOSKELETAL:  No edema; No deformity  SKIN: Warm and dry NEUROLOGIC:  Alert and oriented x 3 PSYCHIATRIC:  Normal affect   ASSESSMENT:    1. Atherosclerosis of native coronary artery of native heart without angina pectoris   2. Essential hypertension   3. Mild aortic stenosis   4. Orthostatic hypotension   5. Paroxysmal SVT (supraventricular tachycardia) (HCC)   6. Pericardial cyst   7. Pure hypercholesterolemia    PLAN:    In order of problems listed above:  1. ASCAD -s/p inf-post MI 2011 s/p DES to RCA. -recurrent CP s/pDES to RCA 06/2010 -recurrent CP s/p DES to LAD 2014withresidual dz treated medically. -she has a long hx of chronic noncardiac CP related to esophageal spasm and stricture.  -recurrent CP 08/2018 with no ischemia on nuclear stress test 09/2018 -now having recurrent chest pain - unfortunately her CP is very difficult to sort out. She has some symptoms that are definitely related to GERD and occur at night with sour taste in her mouth and some vomiting but she also has CP that occurs with exertion and gets diaphoretic  -I have expressed my concerns with the patient and her husband in regards to the amount of radiation she has had through the years with multiple caths and myoviews.  At this time I think she needs a repeat cath to redefine coronary anatomy so that in the future we can be reassured that if cath is stable that her CP is not cardiac. -Cardiac catheterization was discussed with the patient fully. The patient understands that risks include but are not limited to stroke (1 in 1000), death (1 in 54), kidney failure [usually temporary] (1 in 500), bleeding (1 in 200), allergic reaction [possibly serious] (1 in 200).  The patient understands and is willing to proceed.   -continue ASA, BB and statin -Plavix stopped due to bleeding  gums  2. HTN -Bp is elevated on exam today and her BP has been elevated as high as 517OHYW systolic -continue Cardizem CD 120mg  daily and Bystolic 2.5mg  daily -her nephrologist and PCP are managing her BP meds and she sees her nephrologist next week -check BP daily for a week and call with results  3. Mild AS -very mild by echo 09/2018 with minimal AS mean AVG 66mmHg  4. Orthostatic hypotension -appears to be stable with no reoccurrence of significant dizziness or syncope  5. SVT -denies any palpitations -continue Cardizem and Bystolic  6. Pericardial Cyst -s/p resection  7.Hyperlipidemia -LDL goal less than 70.  -LDL 76 in [place] 2021 -repeat FLp and ALT -continue Crestor  20mg  daily  8.  Chronic LLE DVT -per PCP   Medication Adjustments/Labs and Tests Ordered: Current medicines are reviewed at length with the patient today.  Concerns regarding medicines are outlined above.  Orders Placed This Encounter  Procedures  . Basic metabolic panel  . CBC   No orders of the defined types were placed in this encounter.   Signed, Fransico Him, MD  09/04/2019 7:06 PM    [place]

## 2019-09-04 NOTE — Patient Instructions (Addendum)
Medication Instructions:  Your physician recommends that you continue on your current medications as directed. Please refer to the Current Medication list given to you today.  *If you need a refill on your cardiac medications before your next appointment, please call your pharmacy*   Lab Work: TODAY: CBC and BMET If you have labs (blood work) drawn today and your tests are completely normal, you will receive your results only by:  [place] (if you have MyChart) OR  A paper copy in the mail If you have any lab test that is abnormal or we need to change your treatment, we will call you to review the results.   Testing/Procedures: Your physician has requested that you have a cardiac catheterization. Cardiac catheterization is used to diagnose and/or treat various heart conditions. Doctors may recommend this procedure for a number of different reasons. The most common reason is to evaluate chest pain. Chest pain can be a symptom of coronary artery disease (CAD), and cardiac catheterization can show whether plaque is narrowing or blocking your hearts arteries. This procedure is also used to evaluate the valves, as well as measure the blood flow and oxygen levels in different parts of your heart. For further information please visit HugeFiesta.[email or web address]. Please follow instruction sheet, as given.  Follow-Up: At [facility], you and your health needs are our priority.  As part of our continuing mission to provide you with exceptional heart care, we have created designated Provider Care Teams.  These Care Teams include your primary Cardiologist (physician) and Advanced Practice Providers (APPs -  Physician Assistants and Nurse Practitioners) who all work together to provide you with the care you need, when you need it.  We recommend signing up for the patient portal called "MyChart".  Sign up information is provided on this After Visit Summary.  MyChart is used to connect with patients for  Virtual Visits (Telemedicine).  Patients are able to view lab/test results, encounter notes, upcoming appointments, etc.  Non-urgent messages can be sent to your provider as well.   To learn more about what you can do with MyChart, go to [email or web address].    Your next appointment:   1 year(s)  The format for your next appointment:   In Person  Provider:   You may see Fransico Him, MD or one of the following Advanced Practice Providers on your designated Care Team:    Melina Copa, PA-C  Ermalinda Barrios, PA-C    Other Instructions    [facility] OFFICE [place], [place] [address] Dept: [phone] Loc: [place]  09/04/2019  You are scheduled for a Cardiac Catheterization on Wednesday, September 1 with Dr. Larae Grooms.  1. Please arrive at the [facility] (Main Entrance A) at [facility]: [address] [place], [place] [address] at 6:30 AM (This time is two hours before your procedure to ensure your preparation). Free valet parking service is available.   Special note: Every effort is made to have your procedure done on time. Please understand that emergencies sometimes delay scheduled procedures.  2. Diet: Do not eat solid foods after midnight.  The patient may have clear liquids until 5am upon the day of the procedure.  3. Labs: You will need to have blood drawn today at [facility] at [facility]. [address]  Open: 7:30am - 5pm    Phone: [phone]. You do not  need to be fasting.  4. COVID SCREENING: You will need to have a COVID Screening on 09/10/2019 at 2:20pm.   5. Medication instructions in preparation for your procedure:  On the morning of your procedure, take your Aspirin and any morning medicines NOT listed above.  You may use sips of water.  5. Plan for one night stay--bring personal  belongings. 6. Bring a current list of your medications and current insurance cards. 7. You MUST have a responsible person to drive you home. 8. Someone MUST be with you the first 24 hours after you arrive home or your discharge will be delayed. 9. Please wear clothes that are easy to get on and off and wear slip-on shoes.  Thank you for allowing [place] to care for you!   --  Invasive Cardiovascular services

## 2019-09-04 NOTE — H&P (View-Only) (Signed)
Cardiology Office Note:    Date:  09/04/2019   ID:  [name], DOB Oct 16, 1945, MRN [id number]  PCP:  Carol Ada, MD  Cardiologist:  Fransico Him, MD    Referring MD: Carol Ada, MD   Chief Complaint  Patient presents with  . Coronary Artery Disease  . Hypertension  . Aortic Stenosis  . Hyperlipidemia    History of Present Illness:    [name] is a 74 y.o. female with a hx of CAD/HTN, pericardial cyst s/p resection and dyslipidemia. She has a history of chronic CP with esophageal strictures and has had esophageal dilatation.She underwent VATS procedure via mini thoracotomy syndrome with open lung bx and was diagnosed with interstitial lung disease. She has chronic CP that has been felt to be noncardiac in the past and has started to reoccur. She has chronic DOE.   She underwent a cardiopulmonary stress test by Pulmonary to evaluate her SOB and it showed deconditioning and poor HR response to exercise. At the request of the pulmonologist her CCB and BB were stoppedas they though her SOB was due to chronotropic incompetence.She then started having palpitations and was seen byher PCP,Dr. [name] was told to take bystolic PRN for palpitations. She then saw Dr. [name] who put her back on Cardizem and dropped her amlodipine to 5mg  daily.A nuclear stress test was done in January 2018 which showed no ischemia. This was done for similar chest pain that she had in the past. This chest pain occurred around the time she lost her dog.  I saw her in early August last year and she was complaining of CP which improved after increasing her nitrate to 120mg  daily. She was under a lot of stress taking care of her husband who has depression. I recommended she see her PCP to discuss treatment of her anxiety and likely depression but she did not pursue this. At that time I reassured her that CP she was having intermittently was likely related to stress but ordered a  repeat stress test which she later called and cancelled. She also had had an episode of syncope related to decreased PO intake and stopped her amlodipine, Lasix and doxazosin but continued her on Losartan 50mg  daily, Cardizem CD 240mg  daily and Bystolic 5mg  daily.   She was seen by [name], PA a week later and was still having decreased PO intake with black diarrhea and was significantly orthostatic on exam. She was weak and dizzy. She was found to be anemic and was sent to [facility] for evaluation. She was given IVF and her Bystolic was stopped and Cardizem decreased. She was seen by GI for chronic diarrhea. She was also dx with acute left peroneal vein DVT and started on Eliquis. Her husband called in on 8/15 with complaints that her dBP was elevated and she was instructed to restart her Bystolic 2.5mg  daily and change to Cardizem CD 120mg  daily.   She saw [name], PA on 09/01/2017 and had not started her Bystolic or Cardizem back and BP was elevated at 164/10mmHg. She was also complaining of bleeding gums and her Plavix was stopped and she was continued on ASA (also on DOAC for DVT). She was instructed to restart Bystolic and Cardizem and only use Lasix PRN for edema.   I saw her for televisit in August and she was still complaining of chest pain.  lexiscan myoview was done and was normal.    She is here today for followup and is doing well.  She continues to have chest pain that she has had for a long time.  She still has problems with waking up at night with burning chest discomfort and vomiting.  Her last upper EGD showed multiple gastric polyps and Schatzkis ring which was dilated.  She says that she has had 2 types of CP.  One pain is typical of what she has had with prior stenting of her heart that she thinks has gotten worse.  The other pain is related to GERD and has persisted despite recent dilation of esophagus.  She has had multiple nuclear stress tests showing no ischemia but has  known CAD.  She also complains that she is having a lot of sweating for no reason that occurs with any exertion and gets nauseated.  She denies any  SOB, DOE, PND, orthopnea, LE edema, dizziness, palpitations or syncope. She is compliant with her meds and is tolerating meds with no SE.    Past Medical History:  Diagnosis Date  . Anxiety   . Arthritis   . Blood transfusion without reported diagnosis    childhood  . Carpal tunnel syndrome, bilateral 07/31/2015  . Cataract   . Cervical spondylosis without myelopathy   . Chronic chest pain   . Chronic kidney disease   . Chronic low back pain   . Chronotropic incompetence   . CKD (chronic kidney disease), stage III   . Coronary artery disease    a. inf-post MI 2011 s/p DES to RCA. b. DES to RCA 06/2010. c. DES to LAD 2014, residual dz treated medically.  . Depression   . DVT (deep venous thrombosis) (HCC)    left  . Esophageal stricture   . GERD (gastroesophageal reflux disease)   . Gross hematuria   . Heart murmur   . History of esophageal dilatation    FOR STRIUCTURE  . History of gout   . History of hiatal hernia   . History of kidney stones   . Hyperlipidemia   . Hypertension   . Hypothyroidism   . Internal hemorrhoids   . Irritable bowel syndrome   . Mild aortic stenosis    by echo 2018  . Myocardial infarction ([place])   . Obesity   . Orthostatic hypotension   . Partial seizure disorder (HCC) NEUROLOGIST-- DR WILLIS   NOCTURNAL  . PONV (postoperative nausea and vomiting)    hard time getting iv site-had to do neck stick 2 yrs ago  . Pulmonary fibrosis ([place])   . S/P pericardial cyst excision    02-05-2013  benign  . Seizures ([place])    none in yrs  . SVT (supraventricular tachycardia) ([place])   . Trigger finger, acquired 09/30/2015   Right middle finger  . Vitamin D deficiency     Past Surgical History:  Procedure Laterality Date  . BIOPSY OF MEDIASTINAL MASS N/A 02/05/2013   Procedure: RESECTION OF MEDIASTINAL MASS;   Surgeon: Ivin Poot, MD;  Location: [place];  Service: Thoracic;  Laterality: N/A;  . CARDIAC CATHETERIZATION Right 01/24/2015   Procedure: right femoral ARTERIAL LINE INSERTION;  Surgeon: Ivin Poot, MD;  Location: [place];  Service: Thoracic;  Laterality: Right;  . CARDIOVASCULAR STRESS TEST  11-22-2013  dr Irish Lack   normal lexiscan study/  no ischemia/  normal LVF and wall motion/ ef 81%  . COLONOSCOPY  last one 2014  . CORONARY ANGIOGRAM  03/10/2011   Procedure: CORONARY ANGIOGRAM;  Surgeon: Jettie Booze, MD;  Location: [facility] CATH LAB;  Service: Cardiovascular;;  . CORONARY ANGIOPLASTY WITH STENT PLACEMENT  11-22-2009  dr Irish Lack   Acute inferoposterior MI/  ef 60%,  PCI with DES x1 to dRCA,  25%  mLAD  . CORONARY ANGIOPLASTY WITH STENT PLACEMENT  06-26-2010  dr Daneen Schick   Cutting balloon angioplasty to dRCA with DES x1,  40% mLAD (non-obstructive CAD)  . CYSTOSCOPY WITH RETROGRADE PYELOGRAM, URETEROSCOPY AND STENT PLACEMENT Right 02/13/2014   Procedure: CYSTOSCOPY WITH RETROGRADE PYELOGRAM, URETEROSCOPY AND STENT PLACEMENT;  Surgeon: Bernestine Amass, MD;  Location: [facility];  Service: Urology;  Laterality: Right;  . ECTOPIC PREGNANCY SURGERY  YRS AGO   SALPINGECTOMY  . ESOPHAGOGASTRODUODENOSCOPY N/A 02/15/2014   Procedure: ESOPHAGOGASTRODUODENOSCOPY (EGD);  Surgeon: Jerene Bears, MD;  Location: [facility];  Service: Endoscopy;  Laterality: N/A;  . ESOPHAGOGASTRODUODENOSCOPY (EGD) WITH ESOPHAGEAL DILATION  05-20-2011  . HOLMIUM LASER APPLICATION Right 03/20/7671   Procedure: HOLMIUM LASER APPLICATION;  Surgeon: Bernestine Amass, MD;  Location[facility];  Service: Urology;  Laterality: Right;  . LEFT HEART CATHETERIZATION WITH CORONARY ANGIOGRAM N/A 03/14/2012   Procedure: LEFT HEART CATHETERIZATION WITH CORONARY ANGIOGRAM;  Surgeon: Sueanne Margarita, MD;  Location: [place] CATH LAB;  Service: Cardiovascular;  Laterality: N/A;  Normal LM,  50% pLAD,  70%  mLAD,  D2 50-70%, very tortuous LAD,  70-82% ostial LCFX,  50% in-stent restenosis of  dRCA and mRCA  stent and 50-70% ostial PDA,  normal LVSF, ef 60%  . LUNG BIOPSY Right 01/24/2015   Procedure: RIGHT LUNG BIOPSY;  Surgeon: Ivin Poot, MD;  Location[place];  Service: Thoracic;  Laterality: Right;  . MEDIASTERNOTOMY N/A 02/05/2013   Procedure: MEDIAN STERNOTOMY;  Surgeon: Ivin Poot, MD;  Location: [place];  Service: Thoracic;  Laterality: N/A;  . MEDIASTERNOTOMY Left 02/05/2013  . NEEDLE GUIDED EXCISION BREAST CALCIFICATIONS Right 08-09-2008  . PERCUTANEOUS CORONARY STENT INTERVENTION (PCI-S) N/A 04/03/2012   Procedure: PERCUTANEOUS CORONARY STENT INTERVENTION (PCI-S);  Surgeon: Jettie Booze, MD;  [facility] LAB;  Service: Cardiovascular;  Laterality: N/A;   Successful PCI  mLAD with 2.75x12 Promus stent, postdilated to >0.28mm  . THORACOTOMY Right 01/24/2015   Procedure: RIGHT MINI/LIMITED THORACOTOMY;  Surgeon: Ivin Poot, MD;  Location: [place];  Service: Thoracic;  Laterality: Right;  . TRANSTHORACIC ECHOCARDIOGRAM  11-17-2011   grade I diastolic dysfunction/  ef 55-60%  . VIDEO BRONCHOSCOPY N/A 01/24/2015   Procedure: RIGHT VIDEO BRONCHOSCOPY;  Surgeon: Ivin Poot, MD;  Location: [facility] OR;  Service: Thoracic;  Laterality: N/A;    Current Medications: Current Meds  Medication Sig  . ALPRAZolam (XANAX) 0.25 MG tablet Take 0.25 mg by mouth 3 (three) times daily.   Marland Kitchen aspirin EC 81 MG tablet Take 81 mg by mouth daily.  . calcitRIOL (ROCALTROL) 0.25 MCG capsule Take 0.25 mcg by mouth daily.   . CVS D3 50 MCG (2000 UT) CAPS Take 2,000 Units by mouth daily.  Marland Kitchen diltiazem (CARDIZEM CD) 120 MG 24 hr capsule TAKE 1 CAPSULE BY MOUTH EVERY DAY  . DULoxetine (CYMBALTA) 60 MG capsule Take 60 mg by mouth daily.  . ferrous sulfate 325 (65 FE) MG tablet Take 325 mg by mouth daily with breakfast.   . HYDROcodone-acetaminophen (NORCO) 10-325 MG tablet Take by mouth.  .  levETIRAcetam (KEPPRA) 500 MG tablet TAKE 1 TABLET BY MOUTH TWICE A DAY  . levothyroxine (SYNTHROID) 100 MCG tablet Take 100 mcg by mouth daily before breakfast.  .  Magnesium Chloride 64 MG TBEC Take 1 tablet by mouth 2 (two) times daily.  . nebivolol (BYSTOLIC) 2.5 MG tablet Take 1 tablet (2.5 mg total) by mouth daily.  . nitroGLYCERIN (NITROSTAT) 0.4 MG SL tablet PLACE 1 TABLET (0.4 MG TOTAL) UNDER THE TONGUE EVERY 5 (FIVE) MINUTES AS NEEDED FOR CHEST PAIN.  Marland Kitchen pantoprazole (PROTONIX) 40 MG tablet TAKE 1 TABLET BY MOUTH TWICE A DAY  . potassium chloride SA (K-DUR,KLOR-CON) 20 MEQ tablet Take 1 tablet by mouth daily.  . rosuvastatin (CRESTOR) 20 MG tablet TAKE 1 TABLET BY MOUTH EVERY DAY IN THE MORNING     Allergies:   Patient has no known allergies.   Social History   Socioeconomic History  . Marital status: Married    Spouse name: Fritz Pickerel  . Number of children: 0  . Years of education: hs  . Highest education level: Not on file  Occupational History  . Occupation: Retired    Fish farm manager: RETIRED  Tobacco Use  . Smoking status: Former Smoker    Packs/day: 0.30    Years: 3.00    Pack years: 0.90    Types: Cigarettes    Start date: 11/08/1964    Quit date: 11/09/1967    Years since quitting: 51.8  . Smokeless tobacco: Never Used  Vaping Use  . Vaping Use: Never used  Substance and Sexual Activity  . Alcohol use: No    Alcohol/week: 0.0 standard drinks  . Drug use: No  . Sexual activity: Not on file  Other Topics Concern  . Not on file  Social History Narrative   Lives at home, married   Left-handed   Daily caffeine use: coffee.   Social Determinants of Health   Financial Resource Strain:   . Difficulty of Paying Living Expenses: Not on file  Food Insecurity:   . Worried About Charity fundraiser in the Last Year: Not on file  . Ran Out of Food in the Last Year: Not on file  Transportation Needs:   . Lack of Transportation (Medical): Not on file  . Lack of  Transportation (Non-Medical): Not on file  Physical Activity:   . Days of Exercise per Week: Not on file  . Minutes of Exercise per Session: Not on file  Stress:   . Feeling of Stress : Not on file  Social Connections:   . Frequency of Communication with Friends and Family: Not on file  . Frequency of Social Gatherings with Friends and Family: Not on file  . Attends Religious Services: Not on file  . Active Member of Clubs or Organizations: Not on file  . Attends Archivist Meetings: Not on file  . Marital Status: Not on file     Family History: The patient's family history includes Breast cancer in her sister and another family member; CVA in her mother; Coronary artery disease in her father; Diabetes Mellitus I in her father; Emphysema in her father; Heart attack in her mother; Heart disease in her father; Hypertension in her father. There is no history of Colon cancer, Stomach cancer, Esophageal cancer, or Rectal cancer.  ROS:   Please see the history of present illness.    ROS  All other systems reviewed and negative.   EKGs/Labs/Other Studies Reviewed:    The following studies were reviewed today: Lexiscan myoview  EKG:  EKG is not ordered today  Recent Labs: 09/13/2018: ALT 10   Recent Lipid Panel    Component Value Date/Time   CHOL 160  09/13/2018 0956   TRIG 94 09/13/2018 0956   HDL 70 09/13/2018 0956   CHOLHDL 2.3 09/13/2018 0956   CHOLHDL 1.8 06/06/2015 0928   VLDL 11 06/06/2015 0928   LDLCALC 73 09/13/2018 0956    Physical Exam:    VS:  BP (!) 141/93   Pulse 68   Ht 4\' 10"  (1.473 m)   Wt 150 lb (68 kg)   SpO2 98%   BMI 31.35 kg/m     Wt Readings from Last 3 Encounters:  09/04/19 150 lb (68 kg)  02/12/19 144 lb 3.2 oz (65.4 kg)  02/07/19 144 lb (65.3 kg)     GEN:  Well nourished, well developed in no acute distress HEENT: Normal NECK: No JVD; No carotid bruits LYMPHATICS: No lymphadenopathy CARDIAC: RRR, no murmurs, rubs,  gallops RESPIRATORY:  Clear to auscultation without rales, wheezing or rhonchi  ABDOMEN: Soft, non-tender, non-distended MUSCULOSKELETAL:  No edema; No deformity  SKIN: Warm and dry NEUROLOGIC:  Alert and oriented x 3 PSYCHIATRIC:  Normal affect   ASSESSMENT:    1. Atherosclerosis of native coronary artery of native heart without angina pectoris   2. Essential hypertension   3. Mild aortic stenosis   4. Orthostatic hypotension   5. Paroxysmal SVT (supraventricular tachycardia) (HCC)   6. Pericardial cyst   7. Pure hypercholesterolemia    PLAN:    In order of problems listed above:  1. ASCAD -s/p inf-post MI 2011 s/p DES to RCA. -recurrent CP s/pDES to RCA 06/2010 -recurrent CP s/p DES to LAD 2014withresidual dz treated medically. -she has a long hx of chronic noncardiac CP related to esophageal spasm and stricture.  -recurrent CP 08/2018 with no ischemia on nuclear stress test 09/2018 -now having recurrent chest pain - unfortunately her CP is very difficult to sort out. She has some symptoms that are definitely related to GERD and occur at night with sour taste in her mouth and some vomiting but she also has CP that occurs with exertion and gets diaphoretic  -I have expressed my concerns with the patient and her husband in regards to the amount of radiation she has had through the years with multiple caths and myoviews.  At this time I think she needs a repeat cath to redefine coronary anatomy so that in the future we can be reassured that if cath is stable that her CP is not cardiac. -Cardiac catheterization was discussed with the patient fully. The patient understands that risks include but are not limited to stroke (1 in 1000), death (1 in 36), kidney failure [usually temporary] (1 in 500), bleeding (1 in 200), allergic reaction [possibly serious] (1 in 200).  The patient understands and is willing to proceed.   -continue ASA, BB and statin -Plavix stopped due to bleeding  gums  2. HTN -Bp is elevated on exam today and her BP has been elevated as high as [id number] systolic -continue Cardizem CD 120mg  daily and Bystolic 2.5mg  daily -her nephrologist and PCP are managing her BP meds and she sees her nephrologist next week -check BP daily for a week and call with results  3. Mild AS -very mild by echo 09/2018 with minimal AS mean AVG 79mmHg  4. Orthostatic hypotension -appears to be stable with no reoccurrence of significant dizziness or syncope  5. SVT -denies any palpitations -continue Cardizem and Bystolic  6. Pericardial Cyst -s/p resection  7.Hyperlipidemia -LDL goal less than 70.  -LDL 76 in [place] 2021 -repeat FLp and ALT -continue Crestor  20mg  daily  8.  Chronic LLE DVT -per PCP   Medication Adjustments/Labs and Tests Ordered: Current medicines are reviewed at length with the patient today.  Concerns regarding medicines are outlined above.  Orders Placed This Encounter  Procedures  . Basic metabolic panel  . CBC   No orders of the defined types were placed in this encounter.   Signed, Fransico Him, MD  09/04/2019 7:06 PM    [place]

## 2019-09-05 DIAGNOSIS — I129 Hypertensive chronic kidney disease with stage 1 through stage 4 chronic kidney disease, or unspecified chronic kidney disease: Secondary | ICD-10-CM | POA: Diagnosis not present

## 2019-09-05 DIAGNOSIS — N183 Chronic kidney disease, stage 3 unspecified: Secondary | ICD-10-CM | POA: Diagnosis not present

## 2019-09-05 LAB — CBC
Hematocrit: 40.7 % (ref 34.0–46.6)
Hemoglobin: 13.4 g/dL (ref 11.1–15.9)
MCH: 28.8 pg (ref 26.6–33.0)
MCHC: 32.9 g/dL (ref 31.5–35.7)
MCV: 88 fL (ref 79–97)
Platelets: 314 10*3/uL (ref 150–450)
RBC: 4.65 x10E6/uL (ref 3.77–5.28)
RDW: 12.5 % (ref 11.7–15.4)
WBC: 9.3 10*3/uL (ref 3.4–10.8)

## 2019-09-05 LAB — BASIC METABOLIC PANEL
BUN/Creatinine Ratio: 11 — ABNORMAL LOW (ref 12–28)
BUN: 13 mg/dL (ref 8–27)
CO2: 24 mmol/L (ref 20–29)
Calcium: 9.4 mg/dL (ref 8.7–10.3)
Chloride: 101 mmol/L (ref 96–106)
Creatinine, Ser: 1.21 mg/dL — ABNORMAL HIGH (ref 0.57–1.00)
GFR calc Af Amer: 51 mL/min/{1.73_m2} — ABNORMAL LOW (ref >59)
GFR calc non Af Amer: 44 mL/min/{1.73_m2} — ABNORMAL LOW (ref >59)
Glucose: 89 mg/dL (ref 65–99)
Potassium: 4.5 mmol/L (ref 3.5–5.2)
Sodium: 139 mmol/L (ref 134–144)

## 2019-09-10 ENCOUNTER — Telehealth: Payer: Self-pay | Admitting: *Deleted

## 2019-09-10 ENCOUNTER — Other Ambulatory Visit (HOSPITAL_COMMUNITY)
Admission: RE | Admit: 2019-09-10 | Discharge: 2019-09-10 | Disposition: A | Discharge status: Home / Self Care | Payer: PPO | Source: Ambulatory Visit | Attending: Interventional Cardiology | Admitting: Interventional Cardiology

## 2019-09-10 DIAGNOSIS — N1832 Chronic kidney disease, stage 3b: Secondary | ICD-10-CM | POA: Diagnosis not present

## 2019-09-10 DIAGNOSIS — I129 Hypertensive chronic kidney disease with stage 1 through stage 4 chronic kidney disease, or unspecified chronic kidney disease: Secondary | ICD-10-CM | POA: Diagnosis not present

## 2019-09-10 DIAGNOSIS — Z20822 Contact with and (suspected) exposure to covid-19: Secondary | ICD-10-CM | POA: Diagnosis not present

## 2019-09-10 DIAGNOSIS — Z01812 Encounter for preprocedural laboratory examination: Secondary | ICD-10-CM | POA: Diagnosis not present

## 2019-09-10 DIAGNOSIS — E889 Metabolic disorder, unspecified: Secondary | ICD-10-CM | POA: Diagnosis not present

## 2019-09-10 DIAGNOSIS — N183 Chronic kidney disease, stage 3 unspecified: Secondary | ICD-10-CM | POA: Diagnosis not present

## 2019-09-10 DIAGNOSIS — M9080 Osteopathy in diseases classified elsewhere, unspecified site: Secondary | ICD-10-CM | POA: Diagnosis not present

## 2019-09-10 DIAGNOSIS — E559 Vitamin D deficiency, unspecified: Secondary | ICD-10-CM | POA: Diagnosis not present

## 2019-09-10 DIAGNOSIS — D631 Anemia in chronic kidney disease: Secondary | ICD-10-CM | POA: Diagnosis not present

## 2019-09-10 LAB — SARS CORONAVIRUS 2 (TAT 6-24 HRS): SARS Coronavirus 2: NEGATIVE

## 2019-09-10 NOTE — Telephone Encounter (Addendum)
Pt contacted pre-catheterization scheduled at [facility] for: Wednesday September 12, 2019 11:30 AM Verified arrival time and place: [place] [facility]) at: 6:30 AM-pre-procedure hydration  No solid food after midnight prior to cath, clear liquids until 5 AM day of procedure.   AM meds can be  taken pre-cath with sips of water including: ASA 81 mg   Confirmed patient has responsible adult to drive home post procedure and observe 24 hours after arriving home: yes  You are allowed ONE visitor in the waiting room during the time you are at the hospital for your procedure. Both you and your visitor must wear a mask once you enter the hospital.       COVID-19 Pre-Screening Questions:  . In the past 10 days have you had a new cough, shortness of breath, headache, congestion, fever (100 or greater) unexplained body aches, new sore throat, or sudden loss of taste or sense of smell? no . In the past 10 days have you been around anyone with known Covid 19? no . Have you been vaccinated for COVID-19? Yes, see immunization history   Reviewed procedure/mask/visitor instructions, COVID-19 questions with patient's husband (DPR), Fritz Pickerel.

## 2019-09-12 ENCOUNTER — Encounter (HOSPITAL_COMMUNITY): Payer: Self-pay | Admitting: Interventional Cardiology

## 2019-09-12 ENCOUNTER — Other Ambulatory Visit: Payer: Self-pay

## 2019-09-12 ENCOUNTER — Ambulatory Visit (HOSPITAL_COMMUNITY)
Admission: RE | Admit: 2019-09-12 | Discharge: 2019-09-12 | Disposition: A | Discharge status: Home / Self Care | Payer: PPO | Attending: Interventional Cardiology | Admitting: Interventional Cardiology

## 2019-09-12 ENCOUNTER — Encounter (HOSPITAL_COMMUNITY)
Admission: RE | Disposition: A | Discharge status: Home / Self Care | Payer: Self-pay | Source: Home / Self Care | Attending: Interventional Cardiology

## 2019-09-12 DIAGNOSIS — E559 Vitamin D deficiency, unspecified: Secondary | ICD-10-CM | POA: Diagnosis not present

## 2019-09-12 DIAGNOSIS — I25118 Atherosclerotic heart disease of native coronary artery with other forms of angina pectoris: Secondary | ICD-10-CM

## 2019-09-12 DIAGNOSIS — Z79899 Other long term (current) drug therapy: Secondary | ICD-10-CM | POA: Insufficient documentation

## 2019-09-12 DIAGNOSIS — Z87891 Personal history of nicotine dependence: Secondary | ICD-10-CM | POA: Diagnosis not present

## 2019-09-12 DIAGNOSIS — G40909 Epilepsy, unspecified, not intractable, without status epilepticus: Secondary | ICD-10-CM | POA: Diagnosis not present

## 2019-09-12 DIAGNOSIS — K219 Gastro-esophageal reflux disease without esophagitis: Secondary | ICD-10-CM | POA: Diagnosis not present

## 2019-09-12 DIAGNOSIS — I471 Supraventricular tachycardia: Secondary | ICD-10-CM | POA: Insufficient documentation

## 2019-09-12 DIAGNOSIS — M109 Gout, unspecified: Secondary | ICD-10-CM | POA: Diagnosis not present

## 2019-09-12 DIAGNOSIS — I82502 Chronic embolism and thrombosis of unspecified deep veins of left lower extremity: Secondary | ICD-10-CM | POA: Diagnosis not present

## 2019-09-12 DIAGNOSIS — I951 Orthostatic hypotension: Secondary | ICD-10-CM | POA: Insufficient documentation

## 2019-09-12 DIAGNOSIS — I25119 Atherosclerotic heart disease of native coronary artery with unspecified angina pectoris: Secondary | ICD-10-CM | POA: Insufficient documentation

## 2019-09-12 DIAGNOSIS — R079 Chest pain, unspecified: Secondary | ICD-10-CM

## 2019-09-12 DIAGNOSIS — Z7982 Long term (current) use of aspirin: Secondary | ICD-10-CM | POA: Diagnosis not present

## 2019-09-12 DIAGNOSIS — F329 Major depressive disorder, single episode, unspecified: Secondary | ICD-10-CM | POA: Insufficient documentation

## 2019-09-12 DIAGNOSIS — F419 Anxiety disorder, unspecified: Secondary | ICD-10-CM | POA: Diagnosis not present

## 2019-09-12 DIAGNOSIS — N183 Chronic kidney disease, stage 3 unspecified: Secondary | ICD-10-CM | POA: Insufficient documentation

## 2019-09-12 DIAGNOSIS — E785 Hyperlipidemia, unspecified: Secondary | ICD-10-CM | POA: Insufficient documentation

## 2019-09-12 DIAGNOSIS — I129 Hypertensive chronic kidney disease with stage 1 through stage 4 chronic kidney disease, or unspecified chronic kidney disease: Secondary | ICD-10-CM | POA: Insufficient documentation

## 2019-09-12 DIAGNOSIS — Z955 Presence of coronary angioplasty implant and graft: Secondary | ICD-10-CM | POA: Insufficient documentation

## 2019-09-12 DIAGNOSIS — Q248 Other specified congenital malformations of heart: Secondary | ICD-10-CM | POA: Diagnosis not present

## 2019-09-12 DIAGNOSIS — I350 Nonrheumatic aortic (valve) stenosis: Secondary | ICD-10-CM | POA: Insufficient documentation

## 2019-09-12 DIAGNOSIS — Z79890 Hormone replacement therapy (postmenopausal): Secondary | ICD-10-CM | POA: Insufficient documentation

## 2019-09-12 DIAGNOSIS — E039 Hypothyroidism, unspecified: Secondary | ICD-10-CM | POA: Insufficient documentation

## 2019-09-12 DIAGNOSIS — E7800 Pure hypercholesterolemia, unspecified: Secondary | ICD-10-CM | POA: Diagnosis not present

## 2019-09-12 DIAGNOSIS — I252 Old myocardial infarction: Secondary | ICD-10-CM | POA: Insufficient documentation

## 2019-09-12 DIAGNOSIS — J8410 Pulmonary fibrosis, unspecified: Secondary | ICD-10-CM | POA: Diagnosis not present

## 2019-09-12 HISTORY — PX: LEFT HEART CATH AND CORONARY ANGIOGRAPHY: CATH118249

## 2019-09-12 SURGERY — LEFT HEART CATH AND CORONARY ANGIOGRAPHY
Anesthesia: LOCAL

## 2019-09-12 MED ORDER — SODIUM CHLORIDE 0.9% FLUSH: 3.0000 mL | Freq: Two times a day (BID) | INTRAVENOUS | Status: DC

## 2019-09-12 MED ORDER — SODIUM CHLORIDE 0.9 % IV SOLN: INTRAVENOUS | Status: AC

## 2019-09-12 MED ORDER — ONDANSETRON HCL 4 MG/2ML IJ SOLN: 4.0000 mg | Freq: Four times a day (QID) | INTRAMUSCULAR | Status: DC | PRN

## 2019-09-12 MED ORDER — VERAPAMIL HCL 2.5 MG/ML IV SOLN
INTRAVENOUS | Status: DC | PRN
  Administered 2019-09-12: 10 mL via INTRA_ARTERIAL

## 2019-09-12 MED ORDER — LIDOCAINE HCL (PF) 1 % IJ SOLN
INTRAMUSCULAR | Status: DC | PRN
  Administered 2019-09-12: 2 mL

## 2019-09-12 MED ORDER — HYDRALAZINE HCL 20 MG/ML IJ SOLN: 10.0000 mg | INTRAMUSCULAR | Status: DC | PRN

## 2019-09-12 MED ORDER — HEPARIN SODIUM (PORCINE) 1000 UNIT/ML IJ SOLN
INTRAMUSCULAR | Status: AC
  Filled 2019-09-12: qty 1

## 2019-09-12 MED ORDER — SODIUM CHLORIDE 0.9 % WEIGHT BASED INFUSION
3.0000 mL/kg/h | INTRAVENOUS | Status: AC
  Administered 2019-09-12: 3 mL/kg/h via INTRAVENOUS

## 2019-09-12 MED ORDER — VERAPAMIL HCL 2.5 MG/ML IV SOLN
INTRAVENOUS | Status: AC
  Filled 2019-09-12: qty 2

## 2019-09-12 MED ORDER — LIDOCAINE HCL (PF) 1 % IJ SOLN
INTRAMUSCULAR | Status: AC
  Filled 2019-09-12: qty 30

## 2019-09-12 MED ORDER — SODIUM CHLORIDE 0.9 % WEIGHT BASED INFUSION: 1.0000 mL/kg/h | INTRAVENOUS | Status: DC

## 2019-09-12 MED ORDER — HEPARIN SODIUM (PORCINE) 1000 UNIT/ML IJ SOLN
INTRAMUSCULAR | Status: DC | PRN
  Administered 2019-09-12: 3000 [IU] via INTRAVENOUS

## 2019-09-12 MED ORDER — ASPIRIN 81 MG PO CHEW: 81.0000 mg | CHEWABLE_TABLET | ORAL | Status: DC

## 2019-09-12 MED ORDER — MIDAZOLAM HCL 2 MG/2ML IJ SOLN
INTRAMUSCULAR | Status: AC
  Filled 2019-09-12: qty 2

## 2019-09-12 MED ORDER — SODIUM CHLORIDE 0.9 % IV SOLN: 250.0000 mL | INTRAVENOUS | Status: DC | PRN

## 2019-09-12 MED ORDER — ACETAMINOPHEN 325 MG PO TABS: 650.0000 mg | ORAL_TABLET | ORAL | Status: DC | PRN

## 2019-09-12 MED ORDER — SODIUM CHLORIDE 0.9% FLUSH: 3.0000 mL | INTRAVENOUS | Status: DC | PRN

## 2019-09-12 MED ORDER — IOHEXOL 350 MG/ML SOLN
INTRAVENOUS | Status: DC | PRN
  Administered 2019-09-12: 10 mL

## 2019-09-12 MED ORDER — MIDAZOLAM HCL 2 MG/2ML IJ SOLN
INTRAMUSCULAR | Status: DC | PRN
  Administered 2019-09-12: 2 mg via INTRAVENOUS

## 2019-09-12 MED ORDER — HEPARIN (PORCINE) IN NACL 1000-0.9 UT/500ML-% IV SOLN
INTRAVENOUS | Status: AC
  Filled 2019-09-12: qty 1000

## 2019-09-12 MED ORDER — FENTANYL CITRATE (PF) 100 MCG/2ML IJ SOLN
INTRAMUSCULAR | Status: AC
  Filled 2019-09-12: qty 2

## 2019-09-12 MED ORDER — LABETALOL HCL 5 MG/ML IV SOLN: 10.0000 mg | INTRAVENOUS | Status: DC | PRN

## 2019-09-12 MED ORDER — HEPARIN (PORCINE) IN NACL 1000-0.9 UT/500ML-% IV SOLN
INTRAVENOUS | Status: DC | PRN
  Administered 2019-09-12 (×2): 500 mL

## 2019-09-12 MED ORDER — FENTANYL CITRATE (PF) 100 MCG/2ML IJ SOLN
INTRAMUSCULAR | Status: DC | PRN
Start: 2019-09-12 — End: 2019-09-12
  Administered 2019-09-12: 25 ug via INTRAVENOUS

## 2019-09-12 SURGICAL SUPPLY — 11 items
CATH 5FR JL3.5 JR4 ANG PIG MP (CATHETERS) ×1 IMPLANT
DEVICE RAD COMP TR BAND LRG (VASCULAR PRODUCTS) ×1 IMPLANT
GLIDESHEATH SLEND SS 6F .021 (SHEATH) ×1 IMPLANT
GUIDEWIRE INQWIRE 1.5J.035X260 (WIRE) IMPLANT
INQWIRE 1.5J .035X260CM (WIRE) ×2
KIT HEART LEFT (KITS) ×2 IMPLANT
PACK CARDIAC CATHETERIZATION (CUSTOM PROCEDURE TRAY) ×2 IMPLANT
SHEATH PROBE COVER 6X72 (BAG) ×1 IMPLANT
TRANSDUCER W/STOPCOCK (MISCELLANEOUS) ×2 IMPLANT
TUBING CIL FLEX 10 FLL-RA (TUBING) ×2 IMPLANT
WIRE HI TORQ VERSACORE-J 145CM (WIRE) ×1 IMPLANT

## 2019-09-12 NOTE — Interval H&P Note (Signed)
Cath Lab Visit (complete for each Cath Lab visit)  Clinical Evaluation Leading to the Procedure:   ACS: No.  Non-ACS:    Anginal Classification: CCS III  Anti-ischemic medical therapy: Minimal Therapy (1 class of medications)  Non-Invasive Test Results: No non-invasive testing performed  Prior CABG: No previous CABG      History and Physical Interval Note:  09/12/2019 12:06 PM  [name]  has presented today for surgery, with the diagnosis of chest pain.  The various methods of treatment have been discussed with the patient and family. After consideration of risks, benefits and other options for treatment, the patient has consented to  Procedure(s): LEFT HEART CATH AND CORONARY ANGIOGRAPHY (N/A) as a surgical intervention.  The patient's history has been reviewed, patient examined, no change in status, stable for surgery.  I have reviewed the patient's chart and labs.  Questions were answered to the patient's satisfaction.     Larae Grooms

## 2019-09-12 NOTE — Discharge Instructions (Signed)
Radial Site Care  This sheet gives you information about how to care for yourself after your procedure. Your health care provider may also give you more specific instructions. If you have problems or questions, contact your health care provider. What can I expect after the procedure? After the procedure, it is common to have:  Bruising and tenderness at the catheter insertion area. Follow these instructions at home: Medicines  Take over-the-counter and prescription medicines only as told by your health care provider. Insertion site care  Follow instructions from your health care provider about how to take care of your insertion site. Make sure you: ? Wash your hands with soap and water before you change your bandage (dressing). If soap and water are not available, use hand sanitizer. ? Change your dressing as told by your health care provider. ? Leave stitches (sutures), skin glue, or adhesive strips in place. These skin closures may need to stay in place for 2 weeks or longer. If adhesive strip edges start to loosen and curl up, you may trim the loose edges. Do not remove adhesive strips completely unless your health care provider tells you to do that.  Check your insertion site every day for signs of infection. Check for: ? Redness, swelling, or pain. ? Fluid or blood. ? Pus or a bad smell. ? Warmth.  Do not take baths, swim, or use a hot tub until your health care provider approves.  You may shower 24-48 hours after the procedure, or as directed by your health care provider. ? Remove the dressing and gently wash the site with plain soap and water. ? Pat the area dry with a clean towel. ? Do not rub the site. That could cause bleeding.  Do not apply powder or lotion to the site. Activity   For 24 hours after the procedure, or as directed by your health care provider: ? Do not flex or bend the affected arm. ? Do not push or pull heavy objects with the affected arm. ? Do not  drive yourself home from the hospital or clinic. You may drive 24 hours after the procedure unless your health care provider tells you not to. ? Do not operate machinery or power tools.  Do not lift anything that is heavier than 10 lb (4.5 kg), or the limit that you are told, until your health care provider says that it is safe.  Ask your health care provider when it is okay to: ? Return to work or school. ? Resume usual physical activities or sports. ? Resume sexual activity. General instructions  If the catheter site starts to bleed, raise your arm and put firm pressure on the site. If the bleeding does not stop, get help right away. This is a medical emergency.  If you went home on the same day as your procedure, a responsible adult should be with you for the first 24 hours after you arrive home.  Keep all follow-up visits as told by your health care provider. This is important. Contact a health care provider if:  You have a fever.  You have redness, swelling, or yellow drainage around your insertion site. Get help right away if:  You have unusual pain at the radial site.  The catheter insertion area swells very fast.  The insertion area is bleeding, and the bleeding does not stop when you hold steady pressure on the area.  Your arm or hand becomes pale, cool, tingly, or numb. These symptoms may represent a serious problem   that is an emergency. Do not wait to see if the symptoms will go away. Get medical help right away. Call your local emergency services (911 in the [place] Do not drive yourself to the hospital. Summary  After the procedure, it is common to have bruising and tenderness at the site.  Follow instructions from your health care provider about how to take care of your radial site wound. Check the wound every day for signs of infection.  Do not lift anything that is heavier than 10 lb (4.5 kg), or the limit that you are told, until your health care provider says  that it is safe. This information is not intended to replace advice given to you by your health care provider. Make sure you discuss any questions you have with your health care provider. Document Revised: 02/02/2017 Document Reviewed: 02/02/2017 Elsevier Patient Education  2020 [employer]  

## 2019-09-13 ENCOUNTER — Encounter (HOSPITAL_COMMUNITY): Payer: Self-pay | Admitting: Interventional Cardiology

## 2019-09-24 DIAGNOSIS — Z23 Encounter for immunization: Secondary | ICD-10-CM | POA: Diagnosis not present

## 2019-09-24 DIAGNOSIS — E559 Vitamin D deficiency, unspecified: Secondary | ICD-10-CM | POA: Diagnosis not present

## 2019-09-24 DIAGNOSIS — E039 Hypothyroidism, unspecified: Secondary | ICD-10-CM | POA: Diagnosis not present

## 2019-09-24 DIAGNOSIS — Z0000 Encounter for general adult medical examination without abnormal findings: Secondary | ICD-10-CM | POA: Diagnosis not present

## 2019-09-24 DIAGNOSIS — E7800 Pure hypercholesterolemia, unspecified: Secondary | ICD-10-CM | POA: Diagnosis not present

## 2019-09-24 DIAGNOSIS — Z1389 Encounter for screening for other disorder: Secondary | ICD-10-CM | POA: Diagnosis not present

## 2019-09-24 DIAGNOSIS — I10 Essential (primary) hypertension: Secondary | ICD-10-CM | POA: Diagnosis not present

## 2019-09-24 DIAGNOSIS — F324 Major depressive disorder, single episode, in partial remission: Secondary | ICD-10-CM | POA: Diagnosis not present

## 2019-09-24 DIAGNOSIS — D508 Other iron deficiency anemias: Secondary | ICD-10-CM | POA: Diagnosis not present

## 2019-09-24 DIAGNOSIS — K219 Gastro-esophageal reflux disease without esophagitis: Secondary | ICD-10-CM | POA: Diagnosis not present

## 2019-09-24 DIAGNOSIS — N183 Chronic kidney disease, stage 3 unspecified: Secondary | ICD-10-CM | POA: Diagnosis not present

## 2019-09-24 DIAGNOSIS — I2510 Atherosclerotic heart disease of native coronary artery without angina pectoris: Secondary | ICD-10-CM | POA: Diagnosis not present

## 2019-10-14 ENCOUNTER — Other Ambulatory Visit: Payer: Self-pay | Admitting: Adult Health

## 2019-10-15 NOTE — Progress Notes (Signed)
PATIENT: [name] DOB: February 03, 1945  REASON FOR VISIT: follow up HISTORY FROM: patient  HISTORY OF PRESENT ILLNESS: Today 10/16/19 [name] is a 74 year old female with history of seizures.  She is on Keppra 500 mg twice a day.  Her seizures are nocturnally. Knows a seizure has happened because she will wake up and the right side of her face will be drawn up and numb.  No recurrent seizure in the last year.  Tolerating medicine well.  She has a chronic tremor to her jaw.  She had a recent heart catheterization, reportedly no blockages.  She lives with her husband, does not drive much.  No recent falls, ambulates with assistance.  Presents today for evaluation unaccompanied.  HISTORY 10/31/2018 SS: [name] is a 74 year old female with history of seizures.  She remains on Keppra 500 mg twice a day.  She reports it has been years since her last seizure.  She does question if she may have had 1 seizure since last seen, but she is not sure if it was an actual seizure or just a dream.  Her seizures occur nocturnally.  She describes the episode a couple months ago that she thinks the right side of her face "drew up" and she was unable to move her right arm to touch her face.  She says this has been her typical seizure in the past.  However, this particular time, she is not able to discern if it was a dream or not.  She does not have any urinary incontinence or oral injury with her seizures.  She reports chronic tremor to her jaw.  She maintains routine follow-up with her primary care doctor.  She is followed by a kidney specialist.  She does not drive much, she lives with her husband.  She is able to ambulate with assistance, she reports she has not had any falls.  She presents today for follow-up unaccompanied.  She was under the impression she was going to see Dr. Jannifer [name] today.  REVIEW OF SYSTEMS: Out of a complete 14 system review of symptoms, the patient complains only of the following  symptoms, and all other reviewed systems are negative.  n/a  ALLERGIES: No Known Allergies  HOME MEDICATIONS: Outpatient Medications Prior to Visit  Medication Sig Dispense Refill  . acetaminophen (TYLENOL) 500 MG tablet Take 1,000 mg by mouth every 8 (eight) hours as needed for moderate pain.    Marland Kitchen ALPRAZolam (XANAX) 0.25 MG tablet Take 0.25 mg by mouth 2 (two) times daily.     Marland Kitchen aspirin EC 81 MG tablet Take 81 mg by mouth daily.    . calcitRIOL (ROCALTROL) 0.25 MCG capsule Take 0.25 mcg by mouth daily.     . CVS D3 50 MCG (2000 UT) CAPS Take 2,000 Units by mouth daily.    Marland Kitchen diltiazem (CARDIZEM CD) 120 MG 24 hr capsule TAKE 1 CAPSULE BY MOUTH EVERY DAY (Patient taking differently: Take 120 mg by mouth daily. ) 90 capsule 1  . DULoxetine (CYMBALTA) 60 MG capsule Take 60 mg by mouth daily.    . ferrous sulfate 325 (65 FE) MG tablet Take 325 mg by mouth daily with breakfast.     . HYDROcodone-acetaminophen (NORCO) 10-325 MG tablet Take 1 tablet by mouth daily as needed for moderate pain.     Marland Kitchen levothyroxine (SYNTHROID) 100 MCG tablet Take 100 mcg by mouth daily before breakfast.    . Magnesium Chloride 64 MG TBEC Take 128 mg by mouth  2 (two) times daily.     . nebivolol (BYSTOLIC) 2.5 MG tablet Take 1 tablet (2.5 mg total) by mouth daily. 90 tablet 2  . nitroGLYCERIN (NITROSTAT) 0.4 MG SL tablet PLACE 1 TABLET (0.4 MG TOTAL) UNDER THE TONGUE EVERY 5 (FIVE) MINUTES AS NEEDED FOR CHEST PAIN. (Patient taking differently: Place 0.4 mg under the tongue every 5 (five) minutes as needed for chest pain. ) 25 tablet 3  . pantoprazole (PROTONIX) 40 MG tablet TAKE 1 TABLET BY MOUTH TWICE A DAY (Patient taking differently: Take 40 mg by mouth 2 (two) times daily. ) 180 tablet 2  . potassium chloride SA (K-DUR,KLOR-CON) 20 MEQ tablet Take 20 mEq by mouth 2 (two) times daily.     . rosuvastatin (CRESTOR) 20 MG tablet TAKE 1 TABLET BY MOUTH EVERY DAY IN THE MORNING (Patient taking differently: Take 20 mg by  mouth daily. ) 90 tablet 3  . levETIRAcetam (KEPPRA) 500 MG tablet TAKE 1 TABLET BY MOUTH TWICE A DAY 180 tablet 0  . calcium citrate (CALCITRATE - DOSED IN MG ELEMENTAL CALCIUM) 950 (200 Ca) MG tablet Take 200 mg of elemental calcium by mouth 2 (two) times daily.     No facility-administered medications prior to visit.    PAST MEDICAL HISTORY: Past Medical History:  Diagnosis Date  . Anxiety   . Arthritis   . Blood transfusion without reported diagnosis    childhood  . Carpal tunnel syndrome, bilateral 07/31/2015  . Cataract   . Cervical spondylosis without myelopathy   . Chronic chest pain   . Chronic kidney disease   . Chronic low back pain   . Chronotropic incompetence   . CKD (chronic kidney disease), stage III ([place])   . Coronary artery disease    a. inf-post MI 2011 s/p DES to RCA. b. DES to RCA 06/2010. c. DES to LAD 2014, residual dz treated medically.  . Depression   . DVT (deep venous thrombosis) (HCC)    left  . Esophageal stricture   . GERD (gastroesophageal reflux disease)   . Gross hematuria   . Heart murmur   . History of esophageal dilatation    FOR STRIUCTURE  . History of gout   . History of hiatal hernia   . History of kidney stones   . Hyperlipidemia   . Hypertension   . Hypothyroidism   . Internal hemorrhoids   . Irritable bowel syndrome   . Mild aortic stenosis    by echo 2018  . Myocardial infarction ([place])   . Obesity   . Orthostatic hypotension   . Partial seizure disorder (HCC) NEUROLOGIST-- DR WILLIS   NOCTURNAL  . PONV (postoperative nausea and vomiting)    hard time getting iv site-had to do neck stick 2 yrs ago  . Pulmonary fibrosis ([place])   . S/P pericardial cyst excision    02-05-2013  benign  . Seizures ([place])    none in yrs  . SVT (supraventricular tachycardia) ([place])   . Trigger finger, acquired 09/30/2015   Right middle finger  . Vitamin D deficiency     PAST SURGICAL HISTORY: Past Surgical History:  Procedure Laterality Date   . BIOPSY OF MEDIASTINAL MASS N/A 02/05/2013   Procedure: RESECTION OF MEDIASTINAL MASS;  Surgeon: Ivin Poot, MD;  Location: [place];  Service: Thoracic;  Laterality: N/A;  . CARDIAC CATHETERIZATION Right 01/24/2015   Procedure: right femoral ARTERIAL LINE INSERTION;  Surgeon: Ivin Poot, MD;  Location: [place];  Service:  Thoracic;  Laterality: Right;  . CARDIOVASCULAR STRESS TEST  11-22-2013  dr Irish Lack   normal lexiscan study/  no ischemia/  normal LVF and wall motion/ ef 81%  . COLONOSCOPY  last one 2014  . CORONARY ANGIOGRAM  03/10/2011   Procedure: CORONARY ANGIOGRAM;  Surgeon: Jettie Booze, MD;  Location: [facility] CATH LAB;  Service: Cardiovascular;;  . CORONARY ANGIOPLASTY WITH STENT PLACEMENT  11-22-2009  dr Irish Lack   Acute inferoposterior MI/  ef 60%,  PCI with DES x1 to dRCA,  25%  mLAD  . CORONARY ANGIOPLASTY WITH STENT PLACEMENT  06-26-2010  dr Daneen Schick   Cutting balloon angioplasty to dRCA with DES x1,  40% mLAD (non-obstructive CAD)  . CYSTOSCOPY WITH RETROGRADE PYELOGRAM, URETEROSCOPY AND STENT PLACEMENT Right 02/13/2014   Procedure: CYSTOSCOPY WITH RETROGRADE PYELOGRAM, URETEROSCOPY AND STENT PLACEMENT;  Surgeon: Bernestine Amass, MD;  Location[facility];  Service: Urology;  Laterality: Right;  . ECTOPIC PREGNANCY SURGERY  YRS AGO   SALPINGECTOMY  . ESOPHAGOGASTRODUODENOSCOPY N/A 02/15/2014   Procedure: ESOPHAGOGASTRODUODENOSCOPY (EGD);  Surgeon: Jerene Bears, MD;  Location: [facility];  Service: Endoscopy;  Laterality: N/A;  . ESOPHAGOGASTRODUODENOSCOPY (EGD) WITH ESOPHAGEAL DILATION  05-20-2011  . HOLMIUM LASER APPLICATION Right 08/17/7670   Procedure: HOLMIUM LASER APPLICATION;  Surgeon: Bernestine Amass, MD;  Location[facility];  Service: Urology;  Laterality: Right;  . LEFT HEART CATH AND CORONARY ANGIOGRAPHY N/A 09/12/2019   Procedure: LEFT HEART CATH AND CORONARY ANGIOGRAPHY;  Surgeon: Jettie Booze, MD;  Location: [place]  CV LAB;  Service: Cardiovascular;  Laterality: N/A;  . LEFT HEART CATHETERIZATION WITH CORONARY ANGIOGRAM N/A 03/14/2012   Procedure: LEFT HEART CATHETERIZATION WITH CORONARY ANGIOGRAM;  Surgeon: Sueanne Margarita, MD;  Location: [place] CATH LAB;  Service: Cardiovascular;  Laterality: N/A;  Normal LM,  50% pLAD,  70% mLAD,  D2 50-70%, very tortuous LAD,  70-82% ostial LCFX,  50% in-stent restenosis of  dRCA and mRCA  stent and 50-70% ostial PDA,  normal LVSF, ef 60%  . LUNG BIOPSY Right 01/24/2015   Procedure: RIGHT LUNG BIOPSY;  Surgeon: Ivin Poot, MD;  Location: [place];  Service: Thoracic;  Laterality: Right;  . MEDIASTERNOTOMY N/A 02/05/2013   Procedure: MEDIAN STERNOTOMY;  Surgeon: Ivin Poot, MD;  Location: [place];  Service: Thoracic;  Laterality: N/A;  . MEDIASTERNOTOMY Left 02/05/2013  . NEEDLE GUIDED EXCISION BREAST CALCIFICATIONS Right 08-09-2008  . PERCUTANEOUS CORONARY STENT INTERVENTION (PCI-S) N/A 04/03/2012   Procedure: PERCUTANEOUS CORONARY STENT INTERVENTION (PCI-S);  Surgeon: Jettie Booze, MD;  Location: [facility] CATH LAB;  Service: Cardiovascular;  Laterality: N/A;   Successful PCI  mLAD with 2.75x12 Promus stent, postdilated to >0.83mm  . THORACOTOMY Right 01/24/2015   Procedure: RIGHT MINI/LIMITED THORACOTOMY;  Surgeon: Ivin Poot, MD;  Location: [place];  Service: Thoracic;  Laterality: Right;  . TRANSTHORACIC ECHOCARDIOGRAM  11-17-2011   grade I diastolic dysfunction/  ef 55-60%  . VIDEO BRONCHOSCOPY N/A 01/24/2015   Procedure: RIGHT VIDEO BRONCHOSCOPY;  Surgeon: Ivin Poot, MD;  Location: [facility];  Service: Thoracic;  Laterality: N/A;    FAMILY HISTORY: Family History  Problem Relation Age of Onset  . Breast cancer Sister   . Heart disease Father   . Hypertension Father   . Emphysema Father   . Coronary artery disease Father   . Diabetes Mellitus I Father   . Heart attack Mother   . CVA Mother   . Breast  cancer Other        niece  . Colon cancer Neg Hx   .  Stomach cancer Neg Hx   . Esophageal cancer Neg Hx   . Rectal cancer Neg Hx     SOCIAL HISTORY: Social History   Socioeconomic History  . Marital status: Married    Spouse name: Fritz Pickerel  . Number of children: 0  . Years of education: hs  . Highest education level: Not on file  Occupational History  . Occupation: Retired    Fish farm manager: RETIRED  Tobacco Use  . Smoking status: Former Smoker    Packs/day: 0.30    Years: 3.00    Pack years: 0.90    Types: Cigarettes    Start date: 11/08/1964    Quit date: 11/09/1967    Years since quitting: 51.9  . Smokeless tobacco: Never Used  Vaping Use  . Vaping Use: Never used  Substance and Sexual Activity  . Alcohol use: No    Alcohol/week: 0.0 standard drinks  . Drug use: No  . Sexual activity: Not on file  Other Topics Concern  . Not on file  Social History Narrative   Lives at home, married   Left-handed   Daily caffeine use: coffee.   Social Determinants of Health   Financial Resource Strain:   . Difficulty of Paying Living Expenses: Not on file  Food Insecurity:   . Worried About Charity fundraiser in the Last Year: Not on file  . Ran Out of Food in the Last Year: Not on file  Transportation Needs:   . Lack of Transportation (Medical): Not on file  . Lack of Transportation (Non-Medical): Not on file  Physical Activity:   . Days of Exercise per Week: Not on file  . Minutes of Exercise per Session: Not on file  Stress:   . Feeling of Stress : Not on file  Social Connections:   . Frequency of Communication with Friends and Family: Not on file  . Frequency of Social Gatherings with Friends and Family: Not on file  . Attends Religious Services: Not on file  . Active Member of Clubs or Organizations: Not on file  . Attends Archivist Meetings: Not on file  . Marital Status: Not on file  Intimate Partner Violence:   . Fear of Current or Ex-Partner: Not on file  . Emotionally Abused: Not on file  . Physically  Abused: Not on file  . Sexually Abused: Not on file   PHYSICAL EXAM  Vitals:   10/16/19 1105  BP: 132/86  Pulse: 62  Weight: 151 lb 3.2 oz (68.6 kg)  Height: 4\' 10"  (1.473 m)   Body mass index is 31.6 kg/m.  Generalized: Well developed, in no acute distress   Neurological examination  Mentation: Alert oriented to time, place, history taking. Follows all commands speech and language fluent Cranial nerve II-XII: Pupils were equal round reactive to light. Extraocular movements were full, visual field were full on confrontational test. Facial sensation and strength were normal. Head turning and shoulder shrug  were normal and symmetric. Motor: Good strength of all 4 extremities, mild jaw tremor noted Sensory: Sensory testing is intact to soft touch on all 4 extremities. No evidence of extinction is noted.  Coordination: Cerebellar testing reveals good finger-nose-finger and heel-to-shin bilaterally.  Gait and station: Gait is normal.  Romberg is negative. No drift is seen.  Reflexes: Deep tendon reflexes are symmetric and normal bilaterally.   DIAGNOSTIC DATA (LABS,  IMAGING, TESTING) - I reviewed patient records, labs, notes, testing and imaging myself where available.  Lab Results  Component Value Date   WBC 9.3 09/04/2019   HGB 13.4 09/04/2019   HCT 40.7 09/04/2019   MCV 88 09/04/2019   PLT 314 09/04/2019      Component Value Date/Time   NA 139 09/04/2019 1623   K 4.5 09/04/2019 1623   CL 101 09/04/2019 1623   CO2 24 09/04/2019 1623   GLUCOSE 89 09/04/2019 1623   GLUCOSE 122 (H) 03/08/2018 0858   BUN 13 09/04/2019 1623   CREATININE 1.21 (H) 09/04/2019 1623   CREATININE 1.12 (H) 06/06/2015 0928   CALCIUM 9.4 09/04/2019 1623   PROT 8.3 (H) 03/08/2018 0858   PROT 6.8 11/03/2017 0859   ALBUMIN 3.8 03/08/2018 0858   ALBUMIN 4.0 11/03/2017 0859   AST 14 (L) 03/08/2018 0858   ALT 10 09/13/2018 0956   ALKPHOS 92 03/08/2018 0858   BILITOT 0.8 03/08/2018 0858   BILITOT  0.4 11/03/2017 0859   GFRNONAA 44 (L) 09/04/2019 1623   GFRAA 51 (L) 09/04/2019 1623   Lab Results  Component Value Date   CHOL 160 09/13/2018   HDL 70 09/13/2018   LDLCALC 73 09/13/2018   TRIG 94 09/13/2018   CHOLHDL 2.3 09/13/2018   Lab Results  Component Value Date   HGBA1C 6.5 (H) 10/15/2014   Lab Results  Component Value Date   VITAMINB12 281 01/30/2010   Lab Results  Component Value Date   TSH 1.063 08/18/2017      ASSESSMENT AND PLAN 74 y.o. year old female  has a past medical history of Anxiety, Arthritis, Blood transfusion without reported diagnosis, Carpal tunnel syndrome, bilateral (07/31/2015), Cataract, Cervical spondylosis without myelopathy, Chronic chest pain, Chronic kidney disease, Chronic low back pain, Chronotropic incompetence, CKD (chronic kidney disease), stage III ([place]), Coronary artery disease, Depression, DVT (deep venous thrombosis) ([place]), Esophageal stricture, GERD (gastroesophageal reflux disease), Gross hematuria, Heart murmur, History of esophageal dilatation, History of gout, History of hiatal hernia, History of kidney stones, Hyperlipidemia, Hypertension, Hypothyroidism, Internal hemorrhoids, Irritable bowel syndrome, Mild aortic stenosis, Myocardial infarction ([place]), Obesity, Orthostatic hypotension, Partial seizure disorder ([place]) (NEUROLOGIST-- DR [name]), PONV (postoperative nausea and vomiting), Pulmonary fibrosis ([place]), S/P pericardial cyst excision, Seizures ([place]), SVT (supraventricular tachycardia) ([place]), Trigger finger, acquired (09/30/2015), and Vitamin D deficiency. here with:  1.  Seizures -No recurrent seizure -Continue Keppra 500 mg twice a day -Call for recurrent seizure -Follow-up 1 year or sooner if needed  I spent 20 minutes of face-to-face and non-face-to-face time with patient.  This included previsit chart review, lab review, study review, order entry, electronic health record documentation, patient education.  Butler Denmark,  AGNP-C, DNP 10/16/2019, 11:26 AM [facility] [address], [place], [place] [address] [phone]

## 2019-10-16 ENCOUNTER — Ambulatory Visit: Payer: PPO | Admitting: Neurology

## 2019-10-16 ENCOUNTER — Other Ambulatory Visit: Payer: Self-pay | Admitting: Internal Medicine

## 2019-10-16 ENCOUNTER — Other Ambulatory Visit: Payer: Self-pay

## 2019-10-16 ENCOUNTER — Encounter: Payer: Self-pay | Admitting: Neurology

## 2019-10-16 DIAGNOSIS — G40109 Localization-related (focal) (partial) symptomatic epilepsy and epileptic syndromes with simple partial seizures, not intractable, without status epilepticus: Secondary | ICD-10-CM | POA: Diagnosis not present

## 2019-10-16 MED ORDER — LEVETIRACETAM 500 MG PO TABS: 500.0000 mg | ORAL_TABLET | Freq: Two times a day (BID) | ORAL | 4 refills | Status: DC

## 2019-10-16 NOTE — Progress Notes (Signed)
I have read the note, and I agree with the clinical assessment and plan.  Byrd Terrero K Bexton Haak   

## 2019-10-16 NOTE — Patient Instructions (Signed)
I am glad you are doing well! Continue Keppra at current dosing  Call for seizure activity See you back in 1 year

## 2019-10-23 ENCOUNTER — Encounter: Payer: Self-pay | Admitting: Gastroenterology

## 2019-10-27 ENCOUNTER — Ambulatory Visit: Payer: PPO | Attending: Internal Medicine

## 2019-10-27 DIAGNOSIS — Z23 Encounter for immunization: Secondary | ICD-10-CM

## 2019-10-27 NOTE — Progress Notes (Signed)
   Covid-19 Vaccination Clinic  Name:  [name]    MRN: [id number] DOB: July 13, 1945  10/27/2019  [name] was observed post Covid-19 immunization for 15 minutes without incident. She was provided with Vaccine Information Sheet and instruction to access the V-Safe system.   [name] was instructed to call 911 with any severe reactions post vaccine: Marland Kitchen Difficulty breathing  . Swelling of face and throat  . A fast heartbeat  . A bad rash all over body  . Dizziness and weakness

## 2019-11-09 DIAGNOSIS — R300 Dysuria: Secondary | ICD-10-CM | POA: Diagnosis not present

## 2019-11-09 DIAGNOSIS — N3000 Acute cystitis without hematuria: Secondary | ICD-10-CM | POA: Diagnosis not present

## 2019-11-15 DIAGNOSIS — I2510 Atherosclerotic heart disease of native coronary artery without angina pectoris: Secondary | ICD-10-CM | POA: Diagnosis not present

## 2019-11-15 DIAGNOSIS — K219 Gastro-esophageal reflux disease without esophagitis: Secondary | ICD-10-CM | POA: Diagnosis not present

## 2019-11-15 DIAGNOSIS — E7800 Pure hypercholesterolemia, unspecified: Secondary | ICD-10-CM | POA: Diagnosis not present

## 2019-11-15 DIAGNOSIS — E039 Hypothyroidism, unspecified: Secondary | ICD-10-CM | POA: Diagnosis not present

## 2019-11-15 DIAGNOSIS — F329 Major depressive disorder, single episode, unspecified: Secondary | ICD-10-CM | POA: Diagnosis not present

## 2019-11-15 DIAGNOSIS — I10 Essential (primary) hypertension: Secondary | ICD-10-CM | POA: Diagnosis not present

## 2019-11-15 DIAGNOSIS — D508 Other iron deficiency anemias: Secondary | ICD-10-CM | POA: Diagnosis not present

## 2019-11-15 DIAGNOSIS — I25119 Atherosclerotic heart disease of native coronary artery with unspecified angina pectoris: Secondary | ICD-10-CM | POA: Diagnosis not present

## 2019-11-15 DIAGNOSIS — G4700 Insomnia, unspecified: Secondary | ICD-10-CM | POA: Diagnosis not present

## 2019-11-15 DIAGNOSIS — D649 Anemia, unspecified: Secondary | ICD-10-CM | POA: Diagnosis not present

## 2019-11-15 DIAGNOSIS — N183 Chronic kidney disease, stage 3 unspecified: Secondary | ICD-10-CM | POA: Diagnosis not present

## 2019-11-15 DIAGNOSIS — F331 Major depressive disorder, recurrent, moderate: Secondary | ICD-10-CM | POA: Diagnosis not present

## 2019-11-28 DIAGNOSIS — N183 Chronic kidney disease, stage 3 unspecified: Secondary | ICD-10-CM | POA: Diagnosis not present

## 2019-11-28 DIAGNOSIS — I129 Hypertensive chronic kidney disease with stage 1 through stage 4 chronic kidney disease, or unspecified chronic kidney disease: Secondary | ICD-10-CM | POA: Diagnosis not present

## 2019-11-28 DIAGNOSIS — D631 Anemia in chronic kidney disease: Secondary | ICD-10-CM | POA: Diagnosis not present

## 2019-12-03 DIAGNOSIS — I129 Hypertensive chronic kidney disease with stage 1 through stage 4 chronic kidney disease, or unspecified chronic kidney disease: Secondary | ICD-10-CM | POA: Diagnosis not present

## 2019-12-03 DIAGNOSIS — N1832 Chronic kidney disease, stage 3b: Secondary | ICD-10-CM | POA: Diagnosis not present

## 2019-12-03 DIAGNOSIS — D631 Anemia in chronic kidney disease: Secondary | ICD-10-CM | POA: Diagnosis not present

## 2019-12-03 DIAGNOSIS — M9080 Osteopathy in diseases classified elsewhere, unspecified site: Secondary | ICD-10-CM | POA: Diagnosis not present

## 2019-12-03 DIAGNOSIS — E889 Metabolic disorder, unspecified: Secondary | ICD-10-CM | POA: Diagnosis not present

## 2019-12-03 DIAGNOSIS — E559 Vitamin D deficiency, unspecified: Secondary | ICD-10-CM | POA: Diagnosis not present

## 2019-12-28 ENCOUNTER — Encounter (HOSPITAL_COMMUNITY): Payer: Self-pay | Admitting: Emergency Medicine

## 2019-12-28 ENCOUNTER — Other Ambulatory Visit: Payer: Self-pay

## 2019-12-28 DIAGNOSIS — Z5321 Procedure and treatment not carried out due to patient leaving prior to being seen by health care provider: Secondary | ICD-10-CM | POA: Insufficient documentation

## 2019-12-28 DIAGNOSIS — R030 Elevated blood-pressure reading, without diagnosis of hypertension: Secondary | ICD-10-CM | POA: Diagnosis present

## 2019-12-28 DIAGNOSIS — I10 Essential (primary) hypertension: Secondary | ICD-10-CM | POA: Insufficient documentation

## 2019-12-28 NOTE — ED Triage Notes (Signed)
Patient here from home reporting hypertension for the last 3 days. States that she has been unable to reach a dr. BP meds with no relief. States that last check was 178/100. Denies other symptoms.

## 2019-12-29 ENCOUNTER — Emergency Department (HOSPITAL_COMMUNITY)
Admission: EM | Admit: 2019-12-29 | Discharge: 2019-12-29 | Disposition: A | Discharge status: Home / Self Care | Payer: PPO | Attending: Emergency Medicine | Admitting: Emergency Medicine

## 2019-12-29 DIAGNOSIS — I10 Essential (primary) hypertension: Secondary | ICD-10-CM | POA: Diagnosis not present

## 2019-12-29 NOTE — ED Notes (Signed)
Pt called for rooming x2. No answer 

## 2019-12-29 NOTE — ED Notes (Signed)
Pt called for rooming x1. No answer!! 

## 2020-01-09 DIAGNOSIS — K219 Gastro-esophageal reflux disease without esophagitis: Secondary | ICD-10-CM | POA: Diagnosis not present

## 2020-01-09 DIAGNOSIS — F324 Major depressive disorder, single episode, in partial remission: Secondary | ICD-10-CM | POA: Diagnosis not present

## 2020-01-09 DIAGNOSIS — D508 Other iron deficiency anemias: Secondary | ICD-10-CM | POA: Diagnosis not present

## 2020-01-09 DIAGNOSIS — I10 Essential (primary) hypertension: Secondary | ICD-10-CM | POA: Diagnosis not present

## 2020-01-09 DIAGNOSIS — E039 Hypothyroidism, unspecified: Secondary | ICD-10-CM | POA: Diagnosis not present

## 2020-01-09 DIAGNOSIS — F329 Major depressive disorder, single episode, unspecified: Secondary | ICD-10-CM | POA: Diagnosis not present

## 2020-01-09 DIAGNOSIS — N183 Chronic kidney disease, stage 3 unspecified: Secondary | ICD-10-CM | POA: Diagnosis not present

## 2020-01-09 DIAGNOSIS — I25119 Atherosclerotic heart disease of native coronary artery with unspecified angina pectoris: Secondary | ICD-10-CM | POA: Diagnosis not present

## 2020-01-09 DIAGNOSIS — G4700 Insomnia, unspecified: Secondary | ICD-10-CM | POA: Diagnosis not present

## 2020-01-09 DIAGNOSIS — E7800 Pure hypercholesterolemia, unspecified: Secondary | ICD-10-CM | POA: Diagnosis not present

## 2020-01-09 DIAGNOSIS — I2510 Atherosclerotic heart disease of native coronary artery without angina pectoris: Secondary | ICD-10-CM | POA: Diagnosis not present

## 2020-01-10 DIAGNOSIS — I2510 Atherosclerotic heart disease of native coronary artery without angina pectoris: Secondary | ICD-10-CM | POA: Diagnosis not present

## 2020-01-14 DIAGNOSIS — N1832 Chronic kidney disease, stage 3b: Secondary | ICD-10-CM | POA: Diagnosis not present

## 2020-01-14 DIAGNOSIS — R809 Proteinuria, unspecified: Secondary | ICD-10-CM | POA: Diagnosis not present

## 2020-01-14 DIAGNOSIS — M9080 Osteopathy in diseases classified elsewhere, unspecified site: Secondary | ICD-10-CM | POA: Diagnosis not present

## 2020-01-14 DIAGNOSIS — E889 Metabolic disorder, unspecified: Secondary | ICD-10-CM | POA: Diagnosis not present

## 2020-01-14 DIAGNOSIS — I129 Hypertensive chronic kidney disease with stage 1 through stage 4 chronic kidney disease, or unspecified chronic kidney disease: Secondary | ICD-10-CM | POA: Diagnosis not present

## 2020-01-14 DIAGNOSIS — E559 Vitamin D deficiency, unspecified: Secondary | ICD-10-CM | POA: Diagnosis not present

## 2020-01-14 DIAGNOSIS — D631 Anemia in chronic kidney disease: Secondary | ICD-10-CM | POA: Diagnosis not present

## 2020-01-16 DIAGNOSIS — Z79899 Other long term (current) drug therapy: Secondary | ICD-10-CM | POA: Diagnosis not present

## 2020-01-16 DIAGNOSIS — Z79891 Long term (current) use of opiate analgesic: Secondary | ICD-10-CM | POA: Diagnosis not present

## 2020-01-16 DIAGNOSIS — Z5181 Encounter for therapeutic drug level monitoring: Secondary | ICD-10-CM | POA: Diagnosis not present

## 2020-01-22 ENCOUNTER — Encounter: Payer: Self-pay | Admitting: Cardiology

## 2020-01-22 ENCOUNTER — Ambulatory Visit: Payer: PPO | Admitting: Cardiology

## 2020-01-22 ENCOUNTER — Other Ambulatory Visit: Payer: Self-pay

## 2020-01-22 VITALS — BP 126/82 | HR 72 | Ht <= 58 in | Wt 150.8 lb

## 2020-01-22 DIAGNOSIS — I951 Orthostatic hypotension: Secondary | ICD-10-CM | POA: Diagnosis not present

## 2020-01-22 DIAGNOSIS — Q248 Other specified congenital malformations of heart: Secondary | ICD-10-CM | POA: Diagnosis not present

## 2020-01-22 DIAGNOSIS — E7800 Pure hypercholesterolemia, unspecified: Secondary | ICD-10-CM

## 2020-01-22 DIAGNOSIS — E1165 Type 2 diabetes mellitus with hyperglycemia: Secondary | ICD-10-CM | POA: Diagnosis not present

## 2020-01-22 DIAGNOSIS — I10 Essential (primary) hypertension: Secondary | ICD-10-CM | POA: Diagnosis not present

## 2020-01-22 DIAGNOSIS — I350 Nonrheumatic aortic (valve) stenosis: Secondary | ICD-10-CM

## 2020-01-22 DIAGNOSIS — I2510 Atherosclerotic heart disease of native coronary artery without angina pectoris: Secondary | ICD-10-CM | POA: Diagnosis not present

## 2020-01-22 DIAGNOSIS — I471 Supraventricular tachycardia: Secondary | ICD-10-CM

## 2020-01-22 DIAGNOSIS — E039 Hypothyroidism, unspecified: Secondary | ICD-10-CM | POA: Diagnosis not present

## 2020-01-22 LAB — LIPID PANEL
Chol/HDL Ratio: 2.1 ratio (ref 0.0–4.4)
Cholesterol, Total: 150 mg/dL (ref 100–199)
HDL: 71 mg/dL (ref >39)
LDL Chol Calc (NIH): 62 mg/dL (ref 0–99)
Triglycerides: 94 mg/dL (ref 0–149)
VLDL Cholesterol Cal: 17 mg/dL (ref 5–40)

## 2020-01-22 LAB — ALT: ALT: 6 IU/L (ref 0–32)

## 2020-01-22 NOTE — Patient Instructions (Signed)
Medication Instructions:  Your physician recommends that you continue on your current medications as directed. Please refer to the Current Medication list given to you today.  *If you need a refill on your cardiac medications before your next appointment, please call your pharmacy*  Lab Work: TODAY: Fasting lipids and ALT If you have labs (blood work) drawn today and your tests are completely normal, you will receive your results only by: MyChart Message (if you have MyChart) OR A paper copy in the mail If you have any lab test that is abnormal or we need to change your treatment, we will call you to review the results.  Follow-Up: At [facility], you and your health needs are our priority.  As part of our continuing mission to provide you with exceptional heart care, we have created designated Provider Care Teams.  These Care Teams include your primary Cardiologist (physician) and Advanced Practice Providers (APPs -  Physician Assistants and Nurse Practitioners) who all work together to provide you with the care you need, when you need it.  Your next appointment:   1 year(s)  The format for your next appointment:   In Person  Provider:   You may see Traci Turner, MD or one of the following Advanced Practice Providers on your designated Care Team:   Dayna Dunn, PA-C Michele Lenze, PA-C  

## 2020-01-22 NOTE — Addendum Note (Signed)
Addended by: Patterson Hammersmith A on: 01/22/2020 11:49 AM   Modules accepted: Orders

## 2020-01-22 NOTE — Addendum Note (Signed)
Addended by: Antonieta Iba on: 01/22/2020 11:50 AM   Modules accepted: Orders

## 2020-01-22 NOTE — Progress Notes (Signed)
Cardiology Office Note:    Date:  01/22/2020   ID:  [name], DOB [birth year], MRN [id number]  PCP:  Carol Ada, MD  Cardiologist:  Fransico Him, MD    Referring MD: Carol Ada, MD   Chief Complaint  Patient presents with  . Coronary Artery Disease  . Hypertension  . Hyperlipidemia  . Shortness of Breath    History of Present Illness:    [name] is a 75 y.o. female with a hx of CAD/HTN, pericardial cyst s/p resection and dyslipidemia. She has a history of chronic CP with esophageal strictures and has had esophageal dilatation.She underwent VATS procedure via mini thoracotomy syndrome with open lung bx and was diagnosed with interstitial lung disease. She has chronic CP that has been felt to be noncardiac in the past and has started to reoccur. She has chronic DOE.   She underwent a cardiopulmonary stress test by Pulmonary to evaluate her SOB and it showed deconditioning and poor HR response to exercise. At the request of the pulmonologist her CCB and BB were stoppedas they though her SOB was due to chronotropic incompetence.She then started having palpitations and was seen byher PCP,Dr. [name] was told to take bystolic PRN for palpitations. She then saw Dr. [name] who put her back on Cardizem and dropped her amlodipine to 5mg  daily.A nuclear stress test was done in January 2018 which showed no ischemia. This was done for similar chest pain that she had in the past. This chest pain occurred around the time she lost her dog.  I saw her in early August last year and she was complaining of CP which improved after increasing her nitrate to 120mg  daily. She was under a lot of stress taking care of her husband who has depression. I recommended she see her PCP to discuss treatment of her anxiety and likely depression but she did not pursue this. At that time I reassured her that CP she was having intermittently was likely related to stress but  ordered a repeat stress test which she later called and cancelled. She also had had an episode of syncope related to decreased PO intake and stopped her amlodipine, Lasix and doxazosin but continued her on Losartan 50mg  daily, Cardizem CD 240mg  daily and Bystolic 5mg  daily.   She was seen by [name], PA a week later and was still having decreased PO intake with black diarrhea and was significantly orthostatic on exam. She was weak and dizzy. She was found to be anemic and was sent to [facility] for evaluation. She was given IVF and her Bystolic was stopped and Cardizem decreased. She was seen by GI for chronic diarrhea. She was also dx with acute left peroneal vein DVT and started on Eliquis. Her husband called in on 8/15 with complaints that her dBP was elevated and she was instructed to restart her Bystolic 2.5mg  daily and change to Cardizem CD 120mg  daily.   She saw [name], PA on 09/01/2017 and had not started her Bystolic or Cardizem back and BP was elevated at 164/82mmHg. She was also complaining of bleeding gums and her Plavix was stopped and she was continued on ASA (also on DOAC for DVT). She was instructed to restart Bystolic and Cardizem and only use Lasix PRN for edema.   I saw her for televisit in August and she was still complaining of chest pain.  lexiscan myoview was done and was normal.  She continued to have CP and underwent cardiac cath in Sept  2021 which showed patent stents and non obstructive disease in the RPDA and Diag of 40%.  It was felt that her CP was GI related.  Her last upper EGD showed multiple gastric polyps and Schatzkis ring which was dilated.    She is here today for followup and is doing well.  She has intermittent CP that is related to her esophagus.  She has chronic DOE that is stable and mainly occurs when she goes up stairs.  Unfortunately she has become quite sedentary and sits on the couch all day and refuses to go to the gym with her husband. She denies  any  PND, orthopnea, LE edema, dizziness, palpitations or syncope. She is compliant with her meds and is tolerating meds with no SE.    Past Medical History:  Diagnosis Date  . Anxiety   . Arthritis   . Blood transfusion without reported diagnosis    childhood  . Carpal tunnel syndrome, bilateral 07/31/2015  . Cataract   . Cervical spondylosis without myelopathy   . Chronic chest pain   . Chronic kidney disease   . Chronic low back pain   . Chronotropic incompetence   . CKD (chronic kidney disease), stage III (HCC)   . Coronary artery disease    a. inf-post MI 2011 s/p DES to RCA. b. DES to RCA 06/2010. c. DES to LAD 2014, residual dz treated medically.  . Depression   . DVT (deep venous thrombosis) (HCC)    left  . Esophageal stricture   . GERD (gastroesophageal reflux disease)   . Gross hematuria   . Heart murmur   . History of esophageal dilatation    FOR STRIUCTURE  . History of gout   . History of hiatal hernia   . History of kidney stones   . Hyperlipidemia   . Hypertension   . Hypothyroidism   . Internal hemorrhoids   . Irritable bowel syndrome   . Mild aortic stenosis    by echo 2018  . Myocardial infarction (HCC)   . Obesity   . Orthostatic hypotension   . Partial seizure disorder (HCC) NEUROLOGIST-- DR WILLIS   NOCTURNAL  . PONV (postoperative nausea and vomiting)    hard time getting iv site-had to do neck stick 2 yrs ago  . Pulmonary fibrosis (HCC)   . S/P pericardial cyst excision    02-05-2013  benign  . Seizures (HCC)    none in yrs  . SVT (supraventricular tachycardia) (HCC)   . Trigger finger, acquired 09/30/2015   Right middle finger  . Vitamin D deficiency     Past Surgical History:  Procedure Laterality Date  . BIOPSY OF MEDIASTINAL MASS N/A 02/05/2013   Procedure: RESECTION OF MEDIASTINAL MASS;  Surgeon: Kerin Perna, MD;  Location: [facility] OR;  Service: Thoracic;  Laterality: N/A;  . CARDIAC CATHETERIZATION Right 01/24/2015   Procedure: right  femoral ARTERIAL LINE INSERTION;  Surgeon: Kerin Perna, MD;  Location: [facility] OR;  Service: Thoracic;  Laterality: Right;  . CARDIOVASCULAR STRESS TEST  11-22-2013  dr Eldridge Dace   normal lexiscan study/  no ischemia/  normal LVF and wall motion/ ef 81%  . COLONOSCOPY  last one 2014  . CORONARY ANGIOGRAM  03/10/2011   Procedure: CORONARY ANGIOGRAM;  Surgeon: Corky Crafts, MD;  Location[facility] CATH LAB;  Service: Cardiovascular;;  . CORONARY ANGIOPLASTY WITH STENT PLACEMENT  11-22-2009  dr Eldridge Dace   Acute inferoposterior MI/  ef 60%,  PCI with DES x1 to  dRCA,  25%  mLAD  . CORONARY ANGIOPLASTY WITH STENT PLACEMENT  06-26-2010  dr Daneen Schick   Cutting balloon angioplasty to dRCA with DES x1,  40% mLAD (non-obstructive CAD)  . CYSTOSCOPY WITH RETROGRADE PYELOGRAM, URETEROSCOPY AND STENT PLACEMENT Right 02/13/2014   Procedure: CYSTOSCOPY WITH RETROGRADE PYELOGRAM, URETEROSCOPY AND STENT PLACEMENT;  Surgeon: Bernestine Amass, MD;  Location: [facility];  Service: Urology;  Laterality: Right;  . ECTOPIC PREGNANCY SURGERY  YRS AGO   SALPINGECTOMY  . ESOPHAGOGASTRODUODENOSCOPY N/A 02/15/2014   Procedure: ESOPHAGOGASTRODUODENOSCOPY (EGD);  Surgeon: Jerene Bears, MD;  Location: [facility];  Service: Endoscopy;  Laterality: N/A;  . ESOPHAGOGASTRODUODENOSCOPY (EGD) WITH ESOPHAGEAL DILATION  05-20-2011  . HOLMIUM LASER APPLICATION Right 08/12/4233   Procedure: HOLMIUM LASER APPLICATION;  Surgeon: Bernestine Amass, MD;  Location: [facility];  Service: Urology;  Laterality: Right;  . LEFT HEART CATH AND CORONARY ANGIOGRAPHY N/A 09/12/2019   Procedure: LEFT HEART CATH AND CORONARY ANGIOGRAPHY;  Surgeon: Jettie Booze, MD;  Location: [place] CV LAB;  Service: Cardiovascular;  Laterality: N/A;  . LEFT HEART CATHETERIZATION WITH CORONARY ANGIOGRAM N/A 03/14/2012   Procedure: LEFT HEART CATHETERIZATION WITH CORONARY ANGIOGRAM;  Surgeon: Sueanne Margarita, MD;  Location: [place] CATH LAB;   Service: Cardiovascular;  Laterality: N/A;  Normal LM,  50% pLAD,  70% mLAD,  D2 50-70%, very tortuous LAD,  70-82% ostial LCFX,  50% in-stent restenosis of  dRCA and mRCA  stent and 50-70% ostial PDA,  normal LVSF, ef 60%  . LUNG BIOPSY Right 01/24/2015   Procedure: RIGHT LUNG BIOPSY;  Surgeon: Ivin Poot, MD;  Location: [place];  Service: Thoracic;  Laterality: Right;  . MEDIASTERNOTOMY N/A 02/05/2013   Procedure: MEDIAN STERNOTOMY;  Surgeon: Ivin Poot, MD;  Location: [place];  Service: Thoracic;  Laterality: N/A;  . MEDIASTERNOTOMY Left 02/05/2013  . NEEDLE GUIDED EXCISION BREAST CALCIFICATIONS Right 08-09-2008  . PERCUTANEOUS CORONARY STENT INTERVENTION (PCI-S) N/A 04/03/2012   Procedure: PERCUTANEOUS CORONARY STENT INTERVENTION (PCI-S);  Surgeon: Jettie Booze, MD;  Location: [facility] CATH LAB;  Service: Cardiovascular;  Laterality: N/A;   Successful PCI  mLAD with 2.75x12 Promus stent, postdilated to >0.40mm  . THORACOTOMY Right 01/24/2015   Procedure: RIGHT MINI/LIMITED THORACOTOMY;  Surgeon: Ivin Poot, MD;  Location: [place];  Service: Thoracic;  Laterality: Right;  . TRANSTHORACIC ECHOCARDIOGRAM  11-17-2011   grade I diastolic dysfunction/  ef 55-60%  . VIDEO BRONCHOSCOPY N/A 01/24/2015   Procedure: RIGHT VIDEO BRONCHOSCOPY;  Surgeon: Ivin Poot, MD;  Location: [facility] OR;  Service: Thoracic;  Laterality: N/A;    Current Medications: Current Meds  Medication Sig  . acetaminophen (TYLENOL) 500 MG tablet Take 1,000 mg by mouth every 8 (eight) hours as needed for moderate pain.  Marland Kitchen ALPRAZolam (XANAX) 0.25 MG tablet Take 0.25 mg by mouth 2 (two) times daily.   Marland Kitchen aspirin EC 81 MG tablet Take 81 mg by mouth daily.  . CVS D3 50 MCG (2000 UT) CAPS Take 2,000 Units by mouth daily.  Marland Kitchen diltiazem (CARDIZEM LA) 180 MG 24 hr tablet Take 180 mg by mouth daily.  . DULoxetine (CYMBALTA) 60 MG capsule Take 60 mg by mouth daily.  . ferrous sulfate 325 (65 FE) MG tablet Take 325 mg by mouth daily  with breakfast.   . HYDROcodone-acetaminophen (NORCO) 10-325 MG tablet Take 1 tablet by mouth daily as needed for moderate pain.   Marland Kitchen levETIRAcetam (KEPPRA) 500 MG tablet Take  1 tablet (500 mg total) by mouth 2 (two) times daily.  Marland Kitchen levothyroxine (SYNTHROID) 100 MCG tablet Take 100 mcg by mouth daily before breakfast.  . Magnesium Chloride 64 MG TBEC Take 128 mg by mouth 2 (two) times daily.   . nebivolol (BYSTOLIC) 2.5 MG tablet Take 1 tablet (2.5 mg total) by mouth daily. (Patient taking differently: Take 5 mg by mouth daily.)  . nitroGLYCERIN (NITROSTAT) 0.4 MG SL tablet PLACE 1 TABLET (0.4 MG TOTAL) UNDER THE TONGUE EVERY 5 (FIVE) MINUTES AS NEEDED FOR CHEST PAIN. (Patient taking differently: Place 0.4 mg under the tongue every 5 (five) minutes as needed for chest pain.)  . pantoprazole (PROTONIX) 40 MG tablet TAKE 1 TABLET BY MOUTH TWICE A DAY  . potassium chloride SA (K-DUR,KLOR-CON) 20 MEQ tablet Take 20 mEq by mouth 2 (two) times daily.   . rosuvastatin (CRESTOR) 20 MG tablet TAKE 1 TABLET BY MOUTH EVERY DAY IN THE MORNING (Patient taking differently: Take 20 mg by mouth daily.)     Allergies:   Patient has no known allergies.   Social History   Socioeconomic History  . Marital status: Married    Spouse name: Fritz Pickerel  . Number of children: 0  . Years of education: hs  . Highest education level: Not on file  Occupational History  . Occupation: Retired    Fish farm manager: RETIRED  Tobacco Use  . Smoking status: Former Smoker    Packs/day: 0.30    Years: 3.00    Pack years: 0.90    Types: Cigarettes    Start date: 11/08/1964    Quit date: 11/09/1967    Years since quitting: 52.2  . Smokeless tobacco: Never Used  Vaping Use  . Vaping Use: Never used  Substance and Sexual Activity  . Alcohol use: No    Alcohol/week: 0.0 standard drinks  . Drug use: No  . Sexual activity: Not on file  Other Topics Concern  . Not on file  Social History Narrative   Lives at home, married    Left-handed   Daily caffeine use: coffee.   Social Determinants of Health   Financial Resource Strain: Not on file  Food Insecurity: Not on file  Transportation Needs: Not on file  Physical Activity: Not on file  Stress: Not on file  Social Connections: Not on file     Family History: The patient's family history includes Breast cancer in her niece and sister; CVA in her mother; Coronary artery disease in her father; Diabetes Mellitus I in her father; Emphysema in her father; Heart attack in her mother; Heart disease in her father; Hypertension in her father. There is no history of Colon cancer, Stomach cancer, Esophageal cancer, or Rectal cancer.  ROS:   Please see the history of present illness.    ROS  All other systems reviewed and negative.   EKGs/Labs/Other Studies Reviewed:    The following studies were reviewed today: Lexiscan myoview  Cardiac Cath 09/2019 Conclusion    Previously placed Mid RCA to Dist RCA stent (unknown type) is widely patent.  Previously placed RPAV stent (unknown type) is widely patent.  RPDA lesion is 40% stenosed.  Previously placed Mid LAD stent (unknown type) is widely patent.  2nd Diag lesion is 40% stenosed.  The left ventricular systolic function is normal.  LV end diastolic pressure is normal.  The left ventricular ejection fraction is 55-65% by visual estimate.  There is no aortic valve stenosis.   Nonobstructive CAD and patent stents.  Small radial artery.  Tortuous right subclavian.  Can perform diagnostic cath from radial artery but it is small.  If PCI was needed, would consider femoral approach.    EKG:  EKG is not ordered today  Recent Labs: 09/04/2019: BUN 13; Creatinine, Ser 1.21; Hemoglobin 13.4; Platelets 314; Potassium 4.5; Sodium 139   Recent Lipid Panel    Component Value Date/Time   CHOL 160 09/13/2018 0956   TRIG 94 09/13/2018 0956   HDL 70 09/13/2018 0956   CHOLHDL 2.3 09/13/2018 0956   CHOLHDL  1.8 06/06/2015 0928   VLDL 11 06/06/2015 0928   LDLCALC 73 09/13/2018 0956    Physical Exam:    VS:  BP 126/82   Pulse 72   Ht 4\' 10"  (1.473 m)   Wt 150 lb 12.8 oz (68.4 kg)   SpO2 97%   BMI 31.52 kg/m     Wt Readings from Last 3 Encounters:  01/22/20 150 lb 12.8 oz (68.4 kg)  10/16/19 151 lb 3.2 oz (68.6 kg)  09/12/19 150 lb (68 kg)     GEN: Well nourished, well developed in no acute distress HEENT: Normal NECK: No JVD; No carotid bruits LYMPHATICS: No lymphadenopathy CARDIAC:RRR, no murmurs, rubs, gallops RESPIRATORY:  Clear to auscultation without rales, wheezing or rhonchi  ABDOMEN: Soft, non-tender, non-distended MUSCULOSKELETAL:  No edema; No deformity  SKIN: Warm and dry NEUROLOGIC:  Alert and oriented x 3 PSYCHIATRIC:  Normal affect    ASSESSMENT:    1. Atherosclerosis of native coronary artery of native heart without angina pectoris   2. Primary hypertension   3. Mild aortic stenosis   4. Orthostatic hypotension   5. Paroxysmal SVT (supraventricular tachycardia) (HCC)   6. Pericardial cyst   7. Pure hypercholesterolemia    PLAN:    In order of problems listed above:  1. ASCAD -s/p inf-post MI 2011 s/p DES to RCA. -recurrent CP s/pDES to RCA 06/2010 -recurrent CP s/p DES to LAD 2014withresidual dz treated medically. -she has a long hx of chronic noncardiac CP related to esophageal spasm and stricture.  -recurrent CP 08/2018 with no ischemia on nuclear stress test 09/2018 -cardiac cath 09/2019 showed patent stents and 40% RPDA and Diagonal -she has chronic noncardiac CP related to her GERD -continue on ASA, BB and statin  2. HTN -BP is well controlled on exam today -continue Cardizem CD 180mg  daily and Bystolic 5mg  daily -her nephrologist and PCP are managing her BP meds  3. Mild AS -very mild by echo 09/2018 with minimal AS mean AVG 63mmHg  4. Orthostatic hypotension -appears to be stable with no reoccurrence of significant  dizziness or syncope  5. SVT -denies any palpitations -continue Cardizem and Bystolic  6. Pericardial Cyst -s/p resection  7.Hyperlipidemia -LDL goal less than 70.  -LDL 76 in March 2021 -repeat FLP and ALT -continue Crestor 20mg  daily   Medication Adjustments/Labs and Tests Ordered: Current medicines are reviewed at length with the patient today.  Concerns regarding medicines are outlined above.  No orders of the defined types were placed in this encounter.  No orders of the defined types were placed in this encounter.   Signed, Fransico Him, MD  01/22/2020 11:43 AM    [place]

## 2020-01-29 DIAGNOSIS — E1165 Type 2 diabetes mellitus with hyperglycemia: Secondary | ICD-10-CM | POA: Diagnosis not present

## 2020-01-29 DIAGNOSIS — L74519 Primary focal hyperhidrosis, unspecified: Secondary | ICD-10-CM | POA: Diagnosis not present

## 2020-01-29 DIAGNOSIS — E200 Idiopathic hypoparathyroidism: Secondary | ICD-10-CM | POA: Diagnosis not present

## 2020-01-29 DIAGNOSIS — I10 Essential (primary) hypertension: Secondary | ICD-10-CM | POA: Diagnosis not present

## 2020-01-29 DIAGNOSIS — E039 Hypothyroidism, unspecified: Secondary | ICD-10-CM | POA: Diagnosis not present

## 2020-01-29 DIAGNOSIS — N189 Chronic kidney disease, unspecified: Secondary | ICD-10-CM | POA: Diagnosis not present

## 2020-02-04 DIAGNOSIS — E7800 Pure hypercholesterolemia, unspecified: Secondary | ICD-10-CM | POA: Diagnosis not present

## 2020-02-04 DIAGNOSIS — G4700 Insomnia, unspecified: Secondary | ICD-10-CM | POA: Diagnosis not present

## 2020-02-04 DIAGNOSIS — I25119 Atherosclerotic heart disease of native coronary artery with unspecified angina pectoris: Secondary | ICD-10-CM | POA: Diagnosis not present

## 2020-02-04 DIAGNOSIS — D508 Other iron deficiency anemias: Secondary | ICD-10-CM | POA: Diagnosis not present

## 2020-02-04 DIAGNOSIS — K219 Gastro-esophageal reflux disease without esophagitis: Secondary | ICD-10-CM | POA: Diagnosis not present

## 2020-02-04 DIAGNOSIS — N183 Chronic kidney disease, stage 3 unspecified: Secondary | ICD-10-CM | POA: Diagnosis not present

## 2020-02-04 DIAGNOSIS — E039 Hypothyroidism, unspecified: Secondary | ICD-10-CM | POA: Diagnosis not present

## 2020-02-04 DIAGNOSIS — I10 Essential (primary) hypertension: Secondary | ICD-10-CM | POA: Diagnosis not present

## 2020-02-04 DIAGNOSIS — I2510 Atherosclerotic heart disease of native coronary artery without angina pectoris: Secondary | ICD-10-CM | POA: Diagnosis not present

## 2020-02-04 DIAGNOSIS — F331 Major depressive disorder, recurrent, moderate: Secondary | ICD-10-CM | POA: Diagnosis not present

## 2020-02-04 DIAGNOSIS — F324 Major depressive disorder, single episode, in partial remission: Secondary | ICD-10-CM | POA: Diagnosis not present

## 2020-02-05 ENCOUNTER — Other Ambulatory Visit: Payer: Self-pay | Admitting: Cardiology

## 2020-02-09 ENCOUNTER — Other Ambulatory Visit: Payer: Self-pay | Admitting: Cardiology

## 2020-02-11 ENCOUNTER — Telehealth: Payer: Self-pay | Admitting: Internal Medicine

## 2020-02-11 ENCOUNTER — Other Ambulatory Visit: Payer: Self-pay

## 2020-02-11 ENCOUNTER — Ambulatory Visit: Payer: PPO | Admitting: Internal Medicine

## 2020-02-11 ENCOUNTER — Encounter: Payer: Self-pay | Admitting: Internal Medicine

## 2020-02-11 ENCOUNTER — Other Ambulatory Visit: Payer: Self-pay | Admitting: Internal Medicine

## 2020-02-11 VITALS — BP 132/90 | HR 87 | Ht <= 58 in | Wt 150.8 lb

## 2020-02-11 DIAGNOSIS — K219 Gastro-esophageal reflux disease without esophagitis: Secondary | ICD-10-CM | POA: Diagnosis not present

## 2020-02-11 DIAGNOSIS — R1310 Dysphagia, unspecified: Secondary | ICD-10-CM | POA: Diagnosis not present

## 2020-02-11 DIAGNOSIS — Z1211 Encounter for screening for malignant neoplasm of colon: Secondary | ICD-10-CM | POA: Diagnosis not present

## 2020-02-11 MED ORDER — PLENVU 140 G PO SOLR: 1.0000 | Freq: Once | ORAL | 0 refills | Status: AC

## 2020-02-11 MED ORDER — PANTOPRAZOLE SODIUM 40 MG PO TBEC
40.0000 mg | DELAYED_RELEASE_TABLET | Freq: Two times a day (BID) | ORAL | 3 refills | Status: DC
Start: 2020-02-11 — End: 2021-03-11

## 2020-02-11 NOTE — Patient Instructions (Signed)
We have sent the following medications to your pharmacy for you to pick up at your convenience: Pantoprazole.  You have been scheduled for a colonoscopy. Please follow written instructions given to you at your visit today.  Please pick up your prep supplies at the pharmacy within the next 1-3 days. If you use inhalers (even only as needed), please bring them with you on the day of your procedure.

## 2020-02-11 NOTE — Progress Notes (Signed)
HISTORY OF PRESENT ILLNESS:  [name] is a 75 y.o. female with multiple medical problems as listed below.  She presents today with her husband for follow-up.  She has chronic GERD for which she takes pantoprazole.  This controls classic symptoms.  She also has chronic dysphagia which is felt mostly dysmotility.  She did have a large caliber distal stricture which was dilated last year.  This did not help her dysphagia.  She still complains about swallowing pills.  She also has episodes of regurgitation coughing.  No new problems.  Her last endoscopy with esophageal dilation was performed January 21.  54 French dilator.  Her last colonoscopy in December 2011 was normal including intubation of the ileum.  There were internal hemorrhoids.  Follow-up in 10 years recommended.  Review of outside blood work from August 2021 shows normal hemoglobin of 13 4.  Doppler ultrasound March 2021 patient fatty liver.  She has completed her COVID vaccination series  REVIEW OF SYSTEMS:  All non-GI ROS negative unless otherwise stated in the HPI except for headaches, hearing problems, heart murmur, muscle cramps, night sweats, shortness of breath, excessive thirst, sleeping problems, arthritis, anxiety, back pain, visual change, cough, depression  Past Medical History:  Diagnosis Date  . Anxiety   . Arthritis   . Blood transfusion without reported diagnosis    childhood  . Carpal tunnel syndrome, bilateral 07/31/2015  . Cataract   . Cervical spondylosis without myelopathy   . Chronic chest pain   . Chronic kidney disease   . Chronic low back pain   . Chronotropic incompetence   . CKD (chronic kidney disease), stage III (HCC)   . Coronary artery disease    a. inf-post MI 2011 s/p DES to RCA. b. DES to RCA 06/2010. c. DES to LAD 2014, residual dz treated medically.  . Depression   . DVT (deep venous thrombosis) (HCC)    left  . Esophageal stricture   . GERD (gastroesophageal reflux disease)   . Gross  hematuria   . Heart murmur   . History of esophageal dilatation    FOR STRIUCTURE  . History of gout   . History of hiatal hernia   . History of kidney stones   . Hyperlipidemia   . Hypertension   . Hypothyroidism   . Internal hemorrhoids   . Irritable bowel syndrome   . Mild aortic stenosis    by echo 2018  . Myocardial infarction (HCC)   . Obesity   . Orthostatic hypotension   . Partial seizure disorder (HCC) NEUROLOGIST-- DR WILLIS   NOCTURNAL  . PONV (postoperative nausea and vomiting)    hard time getting iv site-had to do neck stick 2 yrs ago  . Pulmonary fibrosis (HCC)   . S/P pericardial cyst excision    02-05-2013  benign  . Seizures (HCC)    none in yrs  . SVT (supraventricular tachycardia) (HCC)   . Trigger finger, acquired 09/30/2015   Right middle finger  . Vitamin D deficiency     Past Surgical History:  Procedure Laterality Date  . BIOPSY OF MEDIASTINAL MASS N/A 02/05/2013   Procedure: RESECTION OF MEDIASTINAL MASS;  Surgeon: Kerin Perna, MD;  Location: [facility] OR;  Service: Thoracic;  Laterality: N/A;  . CARDIAC CATHETERIZATION Right 01/24/2015   Procedure: right femoral ARTERIAL LINE INSERTION;  Surgeon: Kerin Perna, MD;  Location: [facility] OR;  Service: Thoracic;  Laterality: Right;  . CARDIOVASCULAR STRESS TEST  11-22-2013  dr Eldridge Dace  normal lexiscan study/  no ischemia/  normal LVF and wall motion/ ef 81%  . COLONOSCOPY  last one 2014  . CORONARY ANGIOGRAM  03/10/2011   Procedure: CORONARY ANGIOGRAM;  Surgeon: Jettie Booze, MD;  Location: [facility] CATH LAB;  Service: Cardiovascular;;  . CORONARY ANGIOPLASTY WITH STENT PLACEMENT  11-22-2009  dr Irish Lack   Acute inferoposterior MI/  ef 60%,  PCI with DES x1 to dRCA,  25%  mLAD  . CORONARY ANGIOPLASTY WITH STENT PLACEMENT  06-26-2010  dr Daneen Schick   Cutting balloon angioplasty to dRCA with DES x1,  40% mLAD (non-obstructive CAD)  . CYSTOSCOPY WITH RETROGRADE PYELOGRAM, URETEROSCOPY AND STENT PLACEMENT  Right 02/13/2014   Procedure: CYSTOSCOPY WITH RETROGRADE PYELOGRAM, URETEROSCOPY AND STENT PLACEMENT;  Surgeon: Bernestine Amass, MD;  Location: [facility];  Service: Urology;  Laterality: Right;  . ECTOPIC PREGNANCY SURGERY  YRS AGO   SALPINGECTOMY  . ESOPHAGOGASTRODUODENOSCOPY N/A 02/15/2014   Procedure: ESOPHAGOGASTRODUODENOSCOPY (EGD);  Surgeon: Jerene Bears, MD;  Location: [facility];  Service: Endoscopy;  Laterality: N/A;  . ESOPHAGOGASTRODUODENOSCOPY (EGD) WITH ESOPHAGEAL DILATION  05-20-2011  . HOLMIUM LASER APPLICATION Right 01/12/9415   Procedure: HOLMIUM LASER APPLICATION;  Surgeon: Bernestine Amass, MD;  Location: [facility];  Service: Urology;  Laterality: Right;  . LEFT HEART CATH AND CORONARY ANGIOGRAPHY N/A 09/12/2019   Procedure: LEFT HEART CATH AND CORONARY ANGIOGRAPHY;  Surgeon: Jettie Booze, MD;  Location: [place] CV LAB;  Service: Cardiovascular;  Laterality: N/A;  . LEFT HEART CATHETERIZATION WITH CORONARY ANGIOGRAM N/A 03/14/2012   Procedure: LEFT HEART CATHETERIZATION WITH CORONARY ANGIOGRAM;  Surgeon: Sueanne Margarita, MD;  Location: [place] CATH LAB;  Service: Cardiovascular;  Laterality: N/A;  Normal LM,  50% pLAD,  70% mLAD,  D2 50-70%, very tortuous LAD,  70-82% ostial LCFX,  50% in-stent restenosis of  dRCA and mRCA  stent and 50-70% ostial PDA,  normal LVSF, ef 60%  . LUNG BIOPSY Right 01/24/2015   Procedure: RIGHT LUNG BIOPSY;  Surgeon: Ivin Poot, MD;  Location: [place];  Service: Thoracic;  Laterality: Right;  . MEDIASTERNOTOMY N/A 02/05/2013   Procedure: MEDIAN STERNOTOMY;  Surgeon: Ivin Poot, MD;  Location: [place];  Service: Thoracic;  Laterality: N/A;  . MEDIASTERNOTOMY Left 02/05/2013  . NEEDLE GUIDED EXCISION BREAST CALCIFICATIONS Right 08-09-2008  . PERCUTANEOUS CORONARY STENT INTERVENTION (PCI-S) N/A 04/03/2012   Procedure: PERCUTANEOUS CORONARY STENT INTERVENTION (PCI-S);  Surgeon: Jettie Booze, MD;  Location: [facility] CATH  LAB;  Service: Cardiovascular;  Laterality: N/A;   Successful PCI  mLAD with 2.75x12 Promus stent, postdilated to >0.49mm  . THORACOTOMY Right 01/24/2015   Procedure: RIGHT MINI/LIMITED THORACOTOMY;  Surgeon: Ivin Poot, MD;  Location: [place];  Service: Thoracic;  Laterality: Right;  . TRANSTHORACIC ECHOCARDIOGRAM  11-17-2011   grade I diastolic dysfunction/  ef 55-60%  . VIDEO BRONCHOSCOPY N/A 01/24/2015   Procedure: RIGHT VIDEO BRONCHOSCOPY;  Surgeon: Ivin Poot, MD;  Location: [facility] OR;  Service: Thoracic;  Laterality: N/A;    Social History [name]  reports that she quit smoking about 52 years ago. Her smoking use included cigarettes. She started smoking about 55 years ago. She has a 0.90 pack-year smoking history. She has never used smokeless tobacco. She reports that she does not drink alcohol and does not use drugs.  family history includes Breast cancer in her niece and sister; CVA in her mother; Coronary artery disease in her father; Diabetes  Mellitus I in her father; Emphysema in her father; Heart attack in her mother; Heart disease in her father; Hypertension in her father.  No Known Allergies     PHYSICAL EXAMINATION: Vital signs: BP 132/90 (BP Location: Right Arm, Patient Position: Sitting)   Pulse 87   Ht 4\' 10"  (1.473 m)   Wt 150 lb 12.8 oz (68.4 kg)   SpO2 95%   BMI 31.52 kg/m   Constitutional: generally well-appearing, no acute distress Psychiatric: alert and oriented x3, cooperative Eyes: extraocular movements intact, anicteric, conjunctiva pink Mouth: oral pharynx moist, no lesions Neck: supple no lymphadenopathy Cardiovascular: heart regular rate and rhythm. Lungs: clear to auscultation bilaterally Abdomen: soft, nontender, nondistended, no obvious ascites, no peritoneal signs, normal bowel sounds, no organomegaly Rectal: Deferred until colonoscopy Extremities: no clubbing, cyanosis, or lower extremity edema bilaterally Skin: no lesions on visible  extremities Neuro: No focal deficits.  Cranial nerves intact  ASSESSMENT:  1.  Chronic dysphagia.  Felt to be principally related to esophageal dysmotility.  She derived no benefit from prior large caliber esophageal dilation.  We discussed esophageal dysmotility 2.  GERD.  Ongoing.  Requires PPI for control of symptoms 3.  Colon cancer screening.  Due for follow-up.  Last examination was normal.  She is motivated 4.  Multiple medical   PLAN:  1.  Reflux precautions 2.  Continue pantoprazole.  Prescription refilled 3.  Schedule screening colonoscopy.The nature of the procedure, as well as the risks, benefits, and alternatives were carefully and thoroughly reviewed with the patient. Ample time for discussion and questions allowed. The patient understood, was satisfied, and agreed to proceed. 4.  Recommended chin tuck maneuver when swallowing.

## 2020-02-11 NOTE — Telephone Encounter (Signed)
Patient's husband called stating that prep for procedure is not covered by pt's insurance and out-of pocket cost is over $100. Please call patient.

## 2020-02-12 NOTE — Telephone Encounter (Signed)
Spoke with patient's husband and told him I would put a Plenvu sample up front for him to pick up.  He agreed

## 2020-02-19 DIAGNOSIS — G40909 Epilepsy, unspecified, not intractable, without status epilepticus: Secondary | ICD-10-CM | POA: Diagnosis not present

## 2020-02-19 DIAGNOSIS — Z7982 Long term (current) use of aspirin: Secondary | ICD-10-CM | POA: Diagnosis not present

## 2020-02-19 DIAGNOSIS — N2581 Secondary hyperparathyroidism of renal origin: Secondary | ICD-10-CM | POA: Diagnosis not present

## 2020-02-19 DIAGNOSIS — I471 Supraventricular tachycardia: Secondary | ICD-10-CM | POA: Diagnosis not present

## 2020-02-19 DIAGNOSIS — J8410 Pulmonary fibrosis, unspecified: Secondary | ICD-10-CM | POA: Diagnosis not present

## 2020-02-19 DIAGNOSIS — D692 Other nonthrombocytopenic purpura: Secondary | ICD-10-CM | POA: Diagnosis not present

## 2020-02-19 DIAGNOSIS — I25118 Atherosclerotic heart disease of native coronary artery with other forms of angina pectoris: Secondary | ICD-10-CM | POA: Diagnosis not present

## 2020-02-19 DIAGNOSIS — F339 Major depressive disorder, recurrent, unspecified: Secondary | ICD-10-CM | POA: Diagnosis not present

## 2020-02-19 DIAGNOSIS — N183 Chronic kidney disease, stage 3 unspecified: Secondary | ICD-10-CM | POA: Diagnosis not present

## 2020-02-20 ENCOUNTER — Encounter: Payer: Self-pay | Admitting: Internal Medicine

## 2020-02-21 ENCOUNTER — Ambulatory Visit: Payer: PPO | Admitting: Internal Medicine

## 2020-02-26 ENCOUNTER — Ambulatory Visit (AMBULATORY_SURGERY_CENTER): Payer: PPO | Admitting: Internal Medicine

## 2020-02-26 ENCOUNTER — Other Ambulatory Visit: Payer: Self-pay

## 2020-02-26 ENCOUNTER — Encounter: Payer: Self-pay | Admitting: Internal Medicine

## 2020-02-26 VITALS — BP 157/80 | HR 67 | Temp 97.3°F | Resp 15 | Ht <= 58 in | Wt 150.0 lb

## 2020-02-26 DIAGNOSIS — Z1211 Encounter for screening for malignant neoplasm of colon: Secondary | ICD-10-CM

## 2020-02-26 MED ORDER — SODIUM CHLORIDE 0.9 % IV SOLN: 500.0000 mL | Freq: Once | INTRAVENOUS | Status: DC

## 2020-02-26 NOTE — Progress Notes (Signed)
Made CRNA aware that pt. B/p elevated and pt. Stated "I did not take b/p medication. Pt. Stated "it has been so long since I had a seizure that I don't know the last time I had one".

## 2020-02-26 NOTE — Op Note (Signed)
[place] Patient Name: [name] Procedure Date: 02/26/2020 10:47 AM MRN: [id number] Endoscopist: Docia Chuck. Henrene Pastor , MD Age: 75 Referring MD:  Date of Birth: Feb 09, 1945 Gender: Female Account #: [id number] Procedure:                Colonoscopy Indications:              Screening for colorectal malignant neoplasm.                            Negative examination December 2011 Medicines:                Monitored Anesthesia Care Procedure:                Pre-Anesthesia Assessment:                           - Prior to the procedure, a History and Physical                            was performed, and patient medications and                            allergies were reviewed. The patient's tolerance of                            previous anesthesia was also reviewed. The risks                            and benefits of the procedure and the sedation                            options and risks were discussed with the patient.                            All questions were answered, and informed consent                            was obtained. Prior Anticoagulants: The patient has                            taken no previous anticoagulant or antiplatelet                            agents. ASA Grade Assessment: II - A patient with                            mild systemic disease. After reviewing the risks                            and benefits, the patient was deemed in                            satisfactory condition to undergo the procedure.  After obtaining informed consent, the colonoscope                            was passed under direct vision. Throughout the                            procedure, the patient's blood pressure, pulse, and                            oxygen saturations were monitored continuously. The                            Olympus CF-HQ190L (Serial# [id number]) Colonoscope was                            introduced through the anus and  advanced to the the                            cecum, identified by appendiceal orifice and                            ileocecal valve. The ileocecal valve, appendiceal                            orifice, and rectum were photographed. The quality                            of the bowel preparation was excellent. The                            colonoscopy was performed without difficulty. The                            patient tolerated the procedure well. The bowel                            preparation used was SUPREP via split dose                            instruction. Scope In: 11:01:43 AM Scope Out: 11:12:08 AM Scope Withdrawal Time: 0 hours 7 minutes 52 seconds  Total Procedure Duration: 0 hours 10 minutes 25 seconds  Findings:                 Incidental small nonbleeding cecal AVM. The entire                            examined colon appeared otherwise normal on direct                            and retroflexion views. Complications:            No immediate complications. Estimated blood loss:  None. Estimated Blood Loss:     Estimated blood loss: none. Impression:               - Incidental cecal AVM. No polyps                           - The entire examined colon is otherwise normal on                            direct and retroflexion views.                           - No specimens collected. Recommendation:           - Repeat colonoscopy is not recommended for                            screening purposes (based on age and current                            findings).                           - Patient has a contact number available for                            emergencies. The signs and symptoms of potential                            delayed complications were discussed with the                            patient. Return to normal activities tomorrow.                            Written discharge instructions were provided to the                             patient.                           - Resume previous diet.                           - Continue present medications. Docia Chuck. Henrene Pastor, MD 02/26/2020 11:17:18 AM This report has been signed electronically.

## 2020-02-26 NOTE — Patient Instructions (Signed)
Discharge instructions given. Normal exam. Resume previous medications. YOU HAD AN ENDOSCOPIC PROCEDURE TODAY AT THE [facility]:   Refer to the procedure report that was given to you for any specific questions about what was found during the examination.  If the procedure report does not answer your questions, please call your gastroenterologist to clarify.  If you requested that your care partner not be given the details of your procedure findings, then the procedure report has been included in a sealed envelope for you to review at your convenience later.  YOU SHOULD EXPECT: Some feelings of bloating in the abdomen. Passage of more gas than usual.  Walking can help get rid of the air that was put into your GI tract during the procedure and reduce the bloating. If you had a lower endoscopy (such as a colonoscopy or flexible sigmoidoscopy) you may notice spotting of blood in your stool or on the toilet paper. If you underwent a bowel prep for your procedure, you may not have a normal bowel movement for a few days.  Please Note:  You might notice some irritation and congestion in your nose or some drainage.  This is from the oxygen used during your procedure.  There is no need for concern and it should clear up in a day or so.  SYMPTOMS TO REPORT IMMEDIATELY:  Following lower endoscopy (colonoscopy or flexible sigmoidoscopy):  Excessive amounts of blood in the stool  Significant tenderness or worsening of abdominal pains  Swelling of the abdomen that is new, acute  Fever of 100F or higher   For urgent or emergent issues, a gastroenterologist can be reached at any hour by calling [phone]. Do not use MyChart messaging for urgent concerns.    DIET:  We do recommend a small meal at first, but then you may proceed to your regular diet.  Drink plenty of fluids but you should avoid alcoholic beverages for 24 hours.  ACTIVITY:  You should plan to take it easy for the rest of  today and you should NOT DRIVE or use heavy machinery until tomorrow (because of the sedation medicines used during the test).    FOLLOW UP: Our staff will call the number listed on your records 48-72 hours following your procedure to check on you and address any questions or concerns that you may have regarding the information given to you following your procedure. If we do not reach you, we will leave a message.  We will attempt to reach you two times.  During this call, we will ask if you have developed any symptoms of COVID 19. If you develop any symptoms (ie: fever, flu-like symptoms, shortness of breath, cough etc.) before then, please call [phone].  If you test positive for Covid 19 in the 2 weeks post procedure, please call and report this information to us.    If any biopsies were taken you will be contacted by phone or by letter within the next 1-3 weeks.  Please call us at [phone] if you have not heard about the biopsies in 3 weeks.    SIGNATURES/CONFIDENTIALITY: You and/or your care partner have signed paperwork which will be entered into your electronic medical record.  These signatures attest to the fact that that the information above on your After Visit Summary has been reviewed and is understood.  Full responsibility of the confidentiality of this discharge information lies with you and/or your care-partner.  

## 2020-02-26 NOTE — Progress Notes (Signed)
pt tolerated well. VSS. awake and to recovery. Report given to RN.  

## 2020-02-28 ENCOUNTER — Telehealth: Payer: Self-pay | Admitting: *Deleted

## 2020-02-28 NOTE — Telephone Encounter (Signed)
  Follow up Call-  Call back number 02/26/2020 02/07/2019  Post procedure Call Back phone  # [phone]  Permission to leave phone message Yes Yes  Some recent data might be hidden     Patient questions:  Do you have a fever, pain , or abdominal swelling? No. Pain Score  0 *  Have you tolerated food without any problems? Yes.    Have you been able to return to your normal activities? Yes.    Do you have any questions about your discharge instructions: Diet   No. Medications  No. Follow up visit  No.  Do you have questions or concerns about your Care? No.  Actions: * If pain score is 4 or above: 1. No action needed, pain <4.Have you developed a fever since your procedure? no  2.   Have you had an respiratory symptoms (SOB or cough) since your procedure? no  3.   Have you tested positive for COVID 19 since your procedure no  4.   Have you had any family members/close contacts diagnosed with the COVID 19 since your procedure?  no   If yes to any of these questions please route to Joylene John, RN and Joella Prince, RN

## 2020-03-24 DIAGNOSIS — I10 Essential (primary) hypertension: Secondary | ICD-10-CM | POA: Diagnosis not present

## 2020-03-24 DIAGNOSIS — I2510 Atherosclerotic heart disease of native coronary artery without angina pectoris: Secondary | ICD-10-CM | POA: Diagnosis not present

## 2020-03-24 DIAGNOSIS — E7800 Pure hypercholesterolemia, unspecified: Secondary | ICD-10-CM | POA: Diagnosis not present

## 2020-03-24 DIAGNOSIS — N183 Chronic kidney disease, stage 3 unspecified: Secondary | ICD-10-CM | POA: Diagnosis not present

## 2020-03-24 DIAGNOSIS — R569 Unspecified convulsions: Secondary | ICD-10-CM | POA: Diagnosis not present

## 2020-03-24 DIAGNOSIS — F331 Major depressive disorder, recurrent, moderate: Secondary | ICD-10-CM | POA: Diagnosis not present

## 2020-03-24 DIAGNOSIS — E039 Hypothyroidism, unspecified: Secondary | ICD-10-CM | POA: Diagnosis not present

## 2020-03-28 DIAGNOSIS — E039 Hypothyroidism, unspecified: Secondary | ICD-10-CM | POA: Diagnosis not present

## 2020-04-02 DIAGNOSIS — I129 Hypertensive chronic kidney disease with stage 1 through stage 4 chronic kidney disease, or unspecified chronic kidney disease: Secondary | ICD-10-CM | POA: Diagnosis not present

## 2020-04-02 DIAGNOSIS — N183 Chronic kidney disease, stage 3 unspecified: Secondary | ICD-10-CM | POA: Diagnosis not present

## 2020-04-07 DIAGNOSIS — D631 Anemia in chronic kidney disease: Secondary | ICD-10-CM | POA: Diagnosis not present

## 2020-04-07 DIAGNOSIS — E889 Metabolic disorder, unspecified: Secondary | ICD-10-CM | POA: Diagnosis not present

## 2020-04-07 DIAGNOSIS — N1831 Chronic kidney disease, stage 3a: Secondary | ICD-10-CM | POA: Diagnosis not present

## 2020-04-07 DIAGNOSIS — E559 Vitamin D deficiency, unspecified: Secondary | ICD-10-CM | POA: Diagnosis not present

## 2020-04-07 DIAGNOSIS — I129 Hypertensive chronic kidney disease with stage 1 through stage 4 chronic kidney disease, or unspecified chronic kidney disease: Secondary | ICD-10-CM | POA: Diagnosis not present

## 2020-04-07 DIAGNOSIS — M9080 Osteopathy in diseases classified elsewhere, unspecified site: Secondary | ICD-10-CM | POA: Diagnosis not present

## 2020-04-07 DIAGNOSIS — N183 Chronic kidney disease, stage 3 unspecified: Secondary | ICD-10-CM | POA: Diagnosis not present

## 2020-05-26 ENCOUNTER — Other Ambulatory Visit: Payer: Self-pay | Admitting: Cardiology

## 2020-07-03 DIAGNOSIS — E039 Hypothyroidism, unspecified: Secondary | ICD-10-CM | POA: Diagnosis not present

## 2020-07-03 DIAGNOSIS — I25119 Atherosclerotic heart disease of native coronary artery with unspecified angina pectoris: Secondary | ICD-10-CM | POA: Diagnosis not present

## 2020-07-03 DIAGNOSIS — K219 Gastro-esophageal reflux disease without esophagitis: Secondary | ICD-10-CM | POA: Diagnosis not present

## 2020-07-03 DIAGNOSIS — I2510 Atherosclerotic heart disease of native coronary artery without angina pectoris: Secondary | ICD-10-CM | POA: Diagnosis not present

## 2020-07-03 DIAGNOSIS — F324 Major depressive disorder, single episode, in partial remission: Secondary | ICD-10-CM | POA: Diagnosis not present

## 2020-07-03 DIAGNOSIS — I10 Essential (primary) hypertension: Secondary | ICD-10-CM | POA: Diagnosis not present

## 2020-07-03 DIAGNOSIS — F329 Major depressive disorder, single episode, unspecified: Secondary | ICD-10-CM | POA: Diagnosis not present

## 2020-07-03 DIAGNOSIS — N183 Chronic kidney disease, stage 3 unspecified: Secondary | ICD-10-CM | POA: Diagnosis not present

## 2020-07-03 DIAGNOSIS — E7800 Pure hypercholesterolemia, unspecified: Secondary | ICD-10-CM | POA: Diagnosis not present

## 2020-07-03 DIAGNOSIS — D649 Anemia, unspecified: Secondary | ICD-10-CM | POA: Diagnosis not present

## 2020-07-03 DIAGNOSIS — G4700 Insomnia, unspecified: Secondary | ICD-10-CM | POA: Diagnosis not present

## 2020-07-17 ENCOUNTER — Other Ambulatory Visit: Payer: Self-pay | Admitting: Cardiology

## 2020-08-26 DIAGNOSIS — K219 Gastro-esophageal reflux disease without esophagitis: Secondary | ICD-10-CM | POA: Diagnosis not present

## 2020-08-26 DIAGNOSIS — E7800 Pure hypercholesterolemia, unspecified: Secondary | ICD-10-CM | POA: Diagnosis not present

## 2020-08-26 DIAGNOSIS — E039 Hypothyroidism, unspecified: Secondary | ICD-10-CM | POA: Diagnosis not present

## 2020-08-26 DIAGNOSIS — F329 Major depressive disorder, single episode, unspecified: Secondary | ICD-10-CM | POA: Diagnosis not present

## 2020-08-26 DIAGNOSIS — D649 Anemia, unspecified: Secondary | ICD-10-CM | POA: Diagnosis not present

## 2020-08-26 DIAGNOSIS — I10 Essential (primary) hypertension: Secondary | ICD-10-CM | POA: Diagnosis not present

## 2020-08-26 DIAGNOSIS — G4700 Insomnia, unspecified: Secondary | ICD-10-CM | POA: Diagnosis not present

## 2020-08-26 DIAGNOSIS — N183 Chronic kidney disease, stage 3 unspecified: Secondary | ICD-10-CM | POA: Diagnosis not present

## 2020-08-26 DIAGNOSIS — I2510 Atherosclerotic heart disease of native coronary artery without angina pectoris: Secondary | ICD-10-CM | POA: Diagnosis not present

## 2020-08-26 DIAGNOSIS — F331 Major depressive disorder, recurrent, moderate: Secondary | ICD-10-CM | POA: Diagnosis not present

## 2020-10-03 DIAGNOSIS — N183 Chronic kidney disease, stage 3 unspecified: Secondary | ICD-10-CM | POA: Diagnosis not present

## 2020-10-03 DIAGNOSIS — I129 Hypertensive chronic kidney disease with stage 1 through stage 4 chronic kidney disease, or unspecified chronic kidney disease: Secondary | ICD-10-CM | POA: Diagnosis not present

## 2020-10-08 DIAGNOSIS — D631 Anemia in chronic kidney disease: Secondary | ICD-10-CM | POA: Diagnosis not present

## 2020-10-08 DIAGNOSIS — E559 Vitamin D deficiency, unspecified: Secondary | ICD-10-CM | POA: Diagnosis not present

## 2020-10-08 DIAGNOSIS — E889 Metabolic disorder, unspecified: Secondary | ICD-10-CM | POA: Diagnosis not present

## 2020-10-08 DIAGNOSIS — N183 Chronic kidney disease, stage 3 unspecified: Secondary | ICD-10-CM | POA: Diagnosis not present

## 2020-10-08 DIAGNOSIS — N1832 Chronic kidney disease, stage 3b: Secondary | ICD-10-CM | POA: Diagnosis not present

## 2020-10-08 DIAGNOSIS — I129 Hypertensive chronic kidney disease with stage 1 through stage 4 chronic kidney disease, or unspecified chronic kidney disease: Secondary | ICD-10-CM | POA: Diagnosis not present

## 2020-10-08 DIAGNOSIS — M9080 Osteopathy in diseases classified elsewhere, unspecified site: Secondary | ICD-10-CM | POA: Diagnosis not present

## 2020-10-15 ENCOUNTER — Ambulatory Visit: Payer: PPO | Admitting: Adult Health

## 2020-10-15 ENCOUNTER — Ambulatory Visit: Payer: PPO | Admitting: Neurology

## 2020-10-28 DIAGNOSIS — D508 Other iron deficiency anemias: Secondary | ICD-10-CM | POA: Diagnosis not present

## 2020-10-28 DIAGNOSIS — F324 Major depressive disorder, single episode, in partial remission: Secondary | ICD-10-CM | POA: Diagnosis not present

## 2020-10-28 DIAGNOSIS — I2510 Atherosclerotic heart disease of native coronary artery without angina pectoris: Secondary | ICD-10-CM | POA: Diagnosis not present

## 2020-10-28 DIAGNOSIS — Z23 Encounter for immunization: Secondary | ICD-10-CM | POA: Diagnosis not present

## 2020-10-28 DIAGNOSIS — E7800 Pure hypercholesterolemia, unspecified: Secondary | ICD-10-CM | POA: Diagnosis not present

## 2020-10-28 DIAGNOSIS — E039 Hypothyroidism, unspecified: Secondary | ICD-10-CM | POA: Diagnosis not present

## 2020-10-28 DIAGNOSIS — K219 Gastro-esophageal reflux disease without esophagitis: Secondary | ICD-10-CM | POA: Diagnosis not present

## 2020-10-28 DIAGNOSIS — Z1389 Encounter for screening for other disorder: Secondary | ICD-10-CM | POA: Diagnosis not present

## 2020-10-28 DIAGNOSIS — Z0000 Encounter for general adult medical examination without abnormal findings: Secondary | ICD-10-CM | POA: Diagnosis not present

## 2020-10-28 DIAGNOSIS — I10 Essential (primary) hypertension: Secondary | ICD-10-CM | POA: Diagnosis not present

## 2020-10-29 ENCOUNTER — Ambulatory Visit: Payer: PPO

## 2020-11-22 DIAGNOSIS — R0989 Other specified symptoms and signs involving the circulatory and respiratory systems: Secondary | ICD-10-CM | POA: Diagnosis not present

## 2020-11-22 DIAGNOSIS — R059 Cough, unspecified: Secondary | ICD-10-CM | POA: Diagnosis not present

## 2020-11-28 DIAGNOSIS — R059 Cough, unspecified: Secondary | ICD-10-CM | POA: Diagnosis not present

## 2020-12-09 ENCOUNTER — Other Ambulatory Visit: Payer: Self-pay | Admitting: Family Medicine

## 2020-12-09 DIAGNOSIS — Z1231 Encounter for screening mammogram for malignant neoplasm of breast: Secondary | ICD-10-CM

## 2020-12-24 ENCOUNTER — Other Ambulatory Visit: Payer: Self-pay | Admitting: Neurology

## 2020-12-24 NOTE — Telephone Encounter (Signed)
Rx refilled.

## 2021-01-14 ENCOUNTER — Ambulatory Visit: Payer: PPO

## 2021-01-21 DIAGNOSIS — E039 Hypothyroidism, unspecified: Secondary | ICD-10-CM | POA: Diagnosis not present

## 2021-01-21 DIAGNOSIS — E1165 Type 2 diabetes mellitus with hyperglycemia: Secondary | ICD-10-CM | POA: Diagnosis not present

## 2021-01-25 ENCOUNTER — Other Ambulatory Visit: Payer: Self-pay | Admitting: Cardiology

## 2021-01-28 DIAGNOSIS — E039 Hypothyroidism, unspecified: Secondary | ICD-10-CM | POA: Diagnosis not present

## 2021-01-28 DIAGNOSIS — E1165 Type 2 diabetes mellitus with hyperglycemia: Secondary | ICD-10-CM | POA: Diagnosis not present

## 2021-01-28 DIAGNOSIS — N189 Chronic kidney disease, unspecified: Secondary | ICD-10-CM | POA: Diagnosis not present

## 2021-01-28 DIAGNOSIS — I10 Essential (primary) hypertension: Secondary | ICD-10-CM | POA: Diagnosis not present

## 2021-01-28 DIAGNOSIS — E200 Idiopathic hypoparathyroidism: Secondary | ICD-10-CM | POA: Diagnosis not present

## 2021-01-29 ENCOUNTER — Encounter: Payer: Self-pay | Admitting: Cardiology

## 2021-01-29 ENCOUNTER — Ambulatory Visit: Payer: PPO | Admitting: Cardiology

## 2021-01-29 ENCOUNTER — Other Ambulatory Visit: Payer: Self-pay

## 2021-01-29 VITALS — BP 130/82 | HR 65 | Ht 59.0 in | Wt 148.0 lb

## 2021-01-29 DIAGNOSIS — I10 Essential (primary) hypertension: Secondary | ICD-10-CM | POA: Diagnosis not present

## 2021-01-29 DIAGNOSIS — I471 Supraventricular tachycardia: Secondary | ICD-10-CM

## 2021-01-29 DIAGNOSIS — Q248 Other specified congenital malformations of heart: Secondary | ICD-10-CM | POA: Diagnosis not present

## 2021-01-29 DIAGNOSIS — I951 Orthostatic hypotension: Secondary | ICD-10-CM

## 2021-01-29 DIAGNOSIS — I350 Nonrheumatic aortic (valve) stenosis: Secondary | ICD-10-CM

## 2021-01-29 DIAGNOSIS — I2510 Atherosclerotic heart disease of native coronary artery without angina pectoris: Secondary | ICD-10-CM

## 2021-01-29 DIAGNOSIS — E7800 Pure hypercholesterolemia, unspecified: Secondary | ICD-10-CM

## 2021-01-29 MED ORDER — ROSUVASTATIN CALCIUM 40 MG PO TABS: 40.0000 mg | ORAL_TABLET | Freq: Every day | ORAL | 3 refills | Status: DC

## 2021-01-29 NOTE — Addendum Note (Signed)
Addended by: Antonieta Iba on: 01/29/2021 11:16 AM   Modules accepted: Orders

## 2021-01-29 NOTE — Patient Instructions (Signed)
Medication Instructions:  Your physician has recommended you make the following change in your medication: 1) INCREASE Crestor (rosuvastatin) to 40 mg daily  *If you need a refill on your cardiac medications before your next appointment, please call your pharmacy*  Lab Work: IN 6 WEEKS: Fasting lipids and ALT  If you have labs (blood work) drawn today and your tests are completely normal, you will receive your results only by: [place] (if you have MyChart) OR A paper copy in the mail If you have any lab test that is abnormal or we need to change your treatment, we will call you to review the results.   Testing/Procedures: Your physician has requested that you have an echocardiogram. Echocardiography is a painless test that uses sound waves to create images of your heart. It provides your doctor with information about the size and shape of your heart and how well your hearts chambers and valves are working. This procedure takes approximately one hour. There are no restrictions for this procedure.   Follow-Up: At [facility], you and your health needs are our priority.  As part of our continuing mission to provide you with exceptional heart care, we have created designated Provider Care Teams.  These Care Teams include your primary Cardiologist (physician) and Advanced Practice Providers (APPs -  Physician Assistants and Nurse Practitioners) who all work together to provide you with the care you need, when you need it.  We recommend signing up for the patient portal called "MyChart".  Sign up information is provided on this After Visit Summary.  MyChart is used to connect with patients for Virtual Visits (Telemedicine).  Patients are able to view lab/test results, encounter notes, upcoming appointments, etc.  Non-urgent messages can be sent to your provider as well.   To learn more about what you can do with MyChart, go to [email or web address].    Your next appointment:   1  year(s)  The format for your next appointment:   In Person  Provider:   Fransico Him, MD

## 2021-01-29 NOTE — Progress Notes (Signed)
Cardiology Office Note:    Date:  01/29/2021   ID:  [name], DOB November 24, 1945, MRN [id number]  PCP:  Carol Ada, MD  Cardiologist:  Fransico Him, MD    Referring MD: Carol Ada, MD   Chief Complaint  Patient presents with   Coronary Artery Disease   Hypertension   Hyperlipidemia   Aortic Stenosis   Chest Pain    History of Present Illness:    [name] is a 76 y.o. female with a hx of CAD/HTN, pericardial cyst s/p resection and dyslipidemia.  She has a history of chronic CP with esophageal strictures and has had esophageal dilatation.  She underwent VATS procedure via mini thoracotomy syndrome with open lung bx and was diagnosed with interstitial lung disease.  She has chronic CP that has been felt to be noncardiac in the past and has started to reoccur.  She has chronic DOE.     She underwent a cardiopulmonary stress test by Pulmonary to evaluate her SOB and it showed deconditioning and poor HR response to exercise.   At the request of the pulmonologist her CCB and BB were stopped as they though her SOB was due to chronotropic incompetence.  She then started having palpitations and was seen by her PCP, Dr. Tamala Julian, and was told to take bystolic PRN for palpitations.   She then saw Dr. [name] who put her back on Cardizem and dropped her amlodipine to 5mg  daily.    A nuclear stress test was done in January 2018 which showed no ischemia.  This was done for similar chest pain that she had in the past.  This chest pain occurred around the time she lost her dog.     I saw her in early August last year and she was complaining of CP which improved after increasing her nitrate to 120mg  daily.  She was under a lot of stress taking care of her husband who has depression.  I recommended she see her PCP to discuss treatment of her anxiety and likely depression but she did not pursue this.  At that time I reassured her that CP she was having intermittently was likely related to stress but  ordered a repeat stress test which she later called and cancelled. She also had had an episode of syncope related to decreased PO intake and stopped her amlodipine, Lasix and doxazosin but continued her on Losartan 50mg  daily, Cardizem CD 240mg  daily and Bystolic 5mg  daily.    She was also dx with acute left peroneal vein DVT and started on Eliquis.   Her husband called in on 8/15 with complaints that her dBP was elevated and she was instructed to restart her Bystolic 2.5mg  daily and change to Cardizem CD 120mg  daily.   She had problems with bleeding gums and her Plavix was stopped and she was continued on ASA (also on DOAC for DVT).     Leane Call was done 9/20 for CP done was normal.  She continued to have CP and underwent cardiac cath in Sept 2021 which showed patent stents and non obstructive disease in the RPDA and Diag of 40%.  It was felt that her CP was GI related.  Her last upper EGD showed multiple gastric polyps and Schatzkis ring which was dilated.    She is here today for followup and is doing well.  She continues to have chronic non cardiac CP.  She says that she has been having sharp knife pain and has an appt  with her GI MD for reevaluation. This is the same pain she was having with her last cath and has not changed any.  She has chronic DOE that she thinks may be a little worse but she is very sedentary.  Occasionally she will have some mild LE edema but stable.  She denies any PND, orthopnea,palpitations or syncope. She is compliant with her meds and is tolerating meds with no SE.     Past Medical History:  Diagnosis Date   Anxiety    Arthritis    Blood transfusion without reported diagnosis    childhood   Carpal tunnel syndrome, bilateral 07/31/2015   Cataract    Cervical spondylosis without myelopathy    Chronic chest pain    Chronic kidney disease    Chronic low back pain    Chronotropic incompetence    CKD (chronic kidney disease), stage III (HCC)    Coronary artery  disease    a. inf-post MI 2011 s/p DES to RCA. b. DES to RCA 06/2010. c. DES to LAD 2014, residual dz treated medically.   Depression    DVT (deep venous thrombosis) (HCC)    left   Esophageal stricture    GERD (gastroesophageal reflux disease)    Gross hematuria    Heart murmur    History of esophageal dilatation    FOR STRIUCTURE   History of gout    History of hiatal hernia    History of kidney stones    Hyperlipidemia    Hypertension    Hypothyroidism    Internal hemorrhoids    Irritable bowel syndrome    Mild aortic stenosis    by echo 2018   Myocardial infarction ([place])    Obesity    Orthostatic hypotension    Partial seizure disorder ([place]) NEUROLOGIST-- DR Jannifer Franklin   NOCTURNAL   PONV (postoperative nausea and vomiting)    hard time getting iv site-had to do neck stick 2 yrs ago   Pulmonary fibrosis (HCC)    S/P pericardial cyst excision    02-05-2013  benign   Seizures ([place])    none in yrs   SVT (supraventricular tachycardia) (HCC)    Trigger finger, acquired 09/30/2015   Right middle finger   Vitamin D deficiency     Past Surgical History:  Procedure Laterality Date   BIOPSY OF MEDIASTINAL MASS N/A 02/05/2013   Procedure: RESECTION OF MEDIASTINAL MASS;  Surgeon: Ivin Poot, MD;  Location: [place];  Service: Thoracic;  Laterality: N/A;   CARDIAC CATHETERIZATION Right 01/24/2015   Procedure: right femoral ARTERIAL LINE INSERTION;  Surgeon: Ivin Poot, MD;  Location: [place];  Service: Thoracic;  Laterality: Right;   CARDIOVASCULAR STRESS TEST  11-22-2013  dr Irish Lack   normal lexiscan study/  no ischemia/  normal LVF and wall motion/ ef 81%   COLONOSCOPY  last one 2014   CORONARY ANGIOGRAM  03/10/2011   Procedure: CORONARY ANGIOGRAM;  Surgeon: Jettie Booze, MD;  Location[facility] C[facility] LAB;  Service: Cardiovascular;;   CORONARY ANGIOPLASTY WITH STENT PLACEMENT  11-22-2009  dr Irish Lack   Acute inferoposterior MI/  ef 60%,  PCI with DES x1 to dRCA,  25%  mLAD    CORONARY ANGIOPLASTY WITH STENT PLACEMENT  06-26-2010  dr Daneen Schick   Cutting balloon angioplasty to dRCA with DES x1,  40% mLAD (non-obstructive CAD)   CYSTOSCOPY WITH RETROGRADE PYELOGRAM, URETEROSCOPY AND STENT PLACEMENT Right 02/13/2014   Procedure: CYSTOSCOPY WITH RETROGRADE PYELOGRAM, URETEROSCOPY AND STENT PLACEMENT;  Surgeon: Bernestine Amass, MD;  Location: [facility];  Service: Urology;  Laterality: Right;   ECTOPIC PREGNANCY SURGERY  YRS AGO   SALPINGECTOMY   ESOPHAGOGASTRODUODENOSCOPY N/A 02/15/2014   Procedure: ESOPHAGOGASTRODUODENOSCOPY (EGD);  Surgeon: Jerene Bears, MD;  Location: [facility];  Service: Endoscopy;  Laterality: N/A;   ESOPHAGOGASTRODUODENOSCOPY (EGD) WITH ESOPHAGEAL DILATION  05-20-2011   HOLMIUM LASER APPLICATION Right 0/08/1446   Procedure: HOLMIUM LASER APPLICATION;  Surgeon: Bernestine Amass, MD;  [facility];  Service: Urology;  Laterality: Right;   LEFT HEART CATH AND CORONARY ANGIOGRAPHY N/A 09/12/2019   Procedure: LEFT HEART CATH AND CORONARY ANGIOGRAPHY;  Surgeon: Jettie Booze, MD;  Location: [place] CV LAB;  Service: Cardiovascular;  Laterality: N/A;   LEFT HEART CATHETERIZATION WITH CORONARY ANGIOGRAM N/A 03/14/2012   Procedure: LEFT HEART CATHETERIZATION WITH CORONARY ANGIOGRAM;  Surgeon: Sueanne Margarita, MD;  Location: [place] CATH LAB;  Service: Cardiovascular;  Laterality: N/A;  Normal LM,  50% pLAD,  70% mLAD,  D2 50-70%, very tortuous LAD,  70-82% ostial LCFX,  50% in-stent restenosis of  dRCA and mRCA  stent and 50-70% ostial PDA,  normal LVSF, ef 60%   LUNG BIOPSY Right 01/24/2015   Procedure: RIGHT LUNG BIOPSY;  Surgeon: Ivin Poot, MD;  Location: [place];  Service: Thoracic;  Laterality: Right;   MEDIASTERNOTOMY N/A 02/05/2013   Procedure: MEDIAN STERNOTOMY;  Surgeon: Ivin Poot, MD;  Location: [place];  Service: Thoracic;  Laterality: N/A;   MEDIASTERNOTOMY Left 02/05/2013   NEEDLE GUIDED EXCISION BREAST  CALCIFICATIONS Right 08-09-2008   PERCUTANEOUS CORONARY STENT INTERVENTION (PCI-S) N/A 04/03/2012   Procedure: PERCUTANEOUS CORONARY STENT INTERVENTION (PCI-S);  Surgeon: Jettie Booze, MD;  Location: [facility] CATH LAB;  Service: Cardiovascular;  Laterality: N/A;   Successful PCI  mLAD with 2.75x12 Promus stent, postdilated to >0.28mm   THORACOTOMY Right 01/24/2015   Procedure: RIGHT MINI/LIMITED THORACOTOMY;  Surgeon: Ivin Poot, MD;  Location: [place];  Service: Thoracic;  Laterality: Right;   TRANSTHORACIC ECHOCARDIOGRAM  11-17-2011   grade I diastolic dysfunction/  ef 55-60%   VIDEO BRONCHOSCOPY N/A 01/24/2015   Procedure: RIGHT VIDEO BRONCHOSCOPY;  Surgeon: Ivin Poot, MD;  Location: [facility] OR;  Service: Thoracic;  Laterality: N/A;    Current Medications: No outpatient medications have been marked as taking for the 01/29/21 encounter (Office Visit) with Sueanne Margarita, MD.     Allergies:   Patient has no known allergies.   Social History   Socioeconomic History   Marital status: Married    Spouse name: Fritz Pickerel   Number of children: 0   Years of education: hs   Highest education level: Not on file  Occupational History   Occupation: Retired    Fish farm manager: RETIRED  Tobacco Use   Smoking status: Former    Packs/day: 0.30    Years: 3.00    Pack years: 0.90    Types: Cigarettes    Start date: 11/08/1964    Quit date: 11/09/1967    Years since quitting: 53.2   Smokeless tobacco: Never  Vaping Use   Vaping Use: Never used  Substance and Sexual Activity   Alcohol use: No    Alcohol/week: 0.0 standard drinks   Drug use: No   Sexual activity: Not on file  Other Topics Concern   Not on file  Social History Narrative   Lives at home, married   Left-handed   Daily caffeine use: coffee.  Social Determinants of Health   Financial Resource Strain: Not on file  Food Insecurity: Not on file  Transportation Needs: Not on file  Physical Activity: Not on file  Stress: Not on  file  Social Connections: Not on file     Family History: The patient's family history includes Breast cancer in her niece and sister; CVA in her mother; Coronary artery disease in her father; Diabetes Mellitus I in her father; Emphysema in her father; Heart attack in her mother; Heart disease in her father; Hypertension in her father. There is no history of Colon cancer, Stomach cancer, Esophageal cancer, or Rectal cancer.  ROS:   Please see the history of present illness.    ROS  All other systems reviewed and negative.   EKGs/Labs/Other Studies Reviewed:    The following studies were reviewed today: Lexiscan myoview  Cardiac Cath 09/2019 Conclusion    Previously placed Mid RCA to Dist RCA stent (unknown type) is widely patent. Previously placed RPAV stent (unknown type) is widely patent. RPDA lesion is 40% stenosed. Previously placed Mid LAD stent (unknown type) is widely patent. 2nd Diag lesion is 40% stenosed. The left ventricular systolic function is normal. LV end diastolic pressure is normal. The left ventricular ejection fraction is 55-65% by visual estimate. There is no aortic valve stenosis.   Nonobstructive CAD and patent stents.     Small radial artery.  Tortuous right subclavian.  Can perform diagnostic cath from radial artery but it is small.  If PCI was needed, would consider femoral approach.    EKG:  EKG is ordered today and demonstrates NSR  Recent Labs: No results found for requested labs within last 8760 hours.   Recent Lipid Panel    Component Value Date/Time   CHOL 150 01/22/2020 1154   TRIG 94 01/22/2020 1154   HDL 71 01/22/2020 1154   CHOLHDL 2.1 01/22/2020 1154   CHOLHDL 1.8 06/06/2015 0928   VLDL 11 06/06/2015 0928   LDLCALC 62 01/22/2020 1154    Physical Exam:    VS:  BP 130/82    Pulse 65    Ht 4\' 11"  (1.499 m)    Wt 148 lb (67.1 kg)    SpO2 98%    BMI 29.89 kg/m     Wt Readings from Last 3 Encounters:  01/29/21 148 lb (67.1 kg)   02/26/20 150 lb (68 kg)  02/11/20 150 lb 12.8 oz (68.4 kg)    GEN: Well nourished, well developed in no acute distress HEENT: Normal NECK: No JVD; No carotid bruits LYMPHATICS: No lymphadenopathy CARDIAC:RRR, no  rubs, gallops.  2/6 SM at RUSB RESPIRATORY:  Clear to auscultation without rales, wheezing or rhonchi  ABDOMEN: Soft, non-tender, non-distended MUSCULOSKELETAL:  No edema; No deformity  SKIN: Warm and dry NEUROLOGIC:  Alert and oriented x 3 PSYCHIATRIC:  Normal affect    ASSESSMENT:    1. Atherosclerosis of native coronary artery of native heart without angina pectoris   2. Primary hypertension   3. Mild aortic stenosis   4. Orthostatic hypotension   5. Paroxysmal SVT (supraventricular tachycardia) (HCC)   6. Pericardial cyst   7. Pure hypercholesterolemia    PLAN:    In order of problems listed above:  1.  ASCAD  -s/p inf-post MI 2011 s/p DES to RCA.  -recurrent CP s/p DES to RCA 06/2010  -recurrent CP s/p  DES to LAD 2014 with residual dz treated medically.   -she has a long hx of chronic noncardiac CP  related to esophageal spasm and stricture.  -recurrent CP 08/2018 with no ischemia on nuclear stress test 09/2018 -cardiac cath 09/2019 showed patent stents and 40% RPDA and Diagonal -She has not had any anginal type chest pain but continues to have her chronic sharp noncardiac chest pains related to her GI issues and has plans to see her GI MD soon - this is the same pain she had that prompted her cath in 2021 that was stable -She will continue on prescription drug management with aspirin 81 mg daily, Crestor 20 mg daily and Bystolic 2.5 mg daily with as needed refills  2.  HTN -Her BP is well controlled on exam today -She will continue prescription drug management Cardizem CD 283 mg daily Bystolic 2.5 mg daily with as needed refills -her nephrologist and PCP are managing her BP meds   3.  Mild AS  -very mild by echo 09/2018 with minimal AS mean AVG 38mmHg -Repeat  2D echocardiogram since its been 2 years to make sure this is stable   4.  Orthostatic hypotension  -appears to be stable  -She has not had any further syncope   5.  SVT  -She is maintaining normal sinus rhythm and denies any palpitations -Continue prescription drug management with Cardizem CD1 180 mg daily and Bystolic 2.5 mg daily with as needed refills  6.  Pericardial Cyst -s/p resection   7.  Hyperlipidemia  -LDL goal less than 70.  -I have personally reviewed and interpreted outside labs performed by patient's PCP which showed  LDL 87, HDL 69, triglycerides 117, ALT 9 on 10/28/2020  -Increase Crestor to 40 mg daily -Repeat FLP and ALT in 6 weeks   Medication Adjustments/Labs and Tests Ordered: Current medicines are reviewed at length with the patient today.  Concerns regarding medicines are outlined above.  Orders Placed This Encounter  Procedures   EKG 12-Lead   No orders of the defined types were placed in this encounter.   Signed, Fransico Him, MD  01/29/2021 11:12 AM    [place]

## 2021-02-02 ENCOUNTER — Ambulatory Visit: Payer: PPO | Admitting: Adult Health

## 2021-02-02 ENCOUNTER — Encounter: Payer: Self-pay | Admitting: Adult Health

## 2021-02-02 VITALS — BP 143/90 | HR 65 | Ht <= 58 in | Wt 149.2 lb

## 2021-02-02 DIAGNOSIS — G40109 Localization-related (focal) (partial) symptomatic epilepsy and epileptic syndromes with simple partial seizures, not intractable, without status epilepticus: Secondary | ICD-10-CM

## 2021-02-02 NOTE — Progress Notes (Signed)
PATIENT: [name] DOB: Jan 13, 1945  REASON FOR VISIT: follow up HISTORY FROM: patient PRIMARY NEUROLOGIST:   HISTORY OF PRESENT ILLNESS: Today 02/02/21:  [name] is a 76 year old female with a history of seizures.  She returns today for follow-up.  She denies any seizure events.  Continues on Keppra 500 mg twice a day.  She operates a motor vehicle on rare occasions.  Reports most the time her husband drives.  Denies any new symptoms.  Returns today for an evaluation. HISTORY  10/16/19 [name] is a 76 year old female with history of seizures.  She is on Keppra 500 mg twice a day.  Her seizures are nocturnally. Knows a seizure has happened because she will wake up and the right side of her face will be drawn up and numb.  No recurrent seizure in the last year.  Tolerating medicine well.  She has a chronic tremor to her jaw.  She had a recent heart catheterization, reportedly no blockages.  She lives with her husband, does not drive much.  No recent falls, ambulates with assistance.  Presents today for evaluation unaccompanied.  REVIEW OF SYSTEMS: Out of a complete 14 system review of symptoms, the patient complains only of the following symptoms, and all other reviewed systems are negative.  ALLERGIES: No Known Allergies  HOME MEDICATIONS: Outpatient Medications Prior to Visit  Medication Sig Dispense Refill   ALPRAZolam (XANAX) 0.25 MG tablet Take 0.25 mg by mouth 2 (two) times daily.      aspirin EC 81 MG tablet Take 81 mg by mouth daily.     calcitRIOL (ROCALTROL) 0.25 MCG capsule Take 0.25 mcg by mouth daily.     CVS D3 50 MCG (2000 UT) CAPS Take 2,000 Units by mouth daily.     diltiazem (CARDIZEM LA) 180 MG 24 hr tablet Take 180 mg by mouth daily.     DULoxetine (CYMBALTA) 60 MG capsule Take 60 mg by mouth daily.     ferrous sulfate 325 (65 FE) MG tablet Take 325 mg by mouth daily with breakfast.      HYDROcodone-acetaminophen (NORCO) 10-325 MG tablet 1 tablet as  needed     levETIRAcetam (KEPPRA) 500 MG tablet TAKE 1 TABLET BY MOUTH TWICE A DAY 180 tablet 4   levothyroxine (SYNTHROID) 100 MCG tablet Take 100 mcg by mouth daily before breakfast.     Magnesium Chloride 64 MG TBEC Take 128 mg by mouth 2 (two) times daily.      nebivolol (BYSTOLIC) 5 MG tablet Take 5 mg by mouth daily.     nitroGLYCERIN (NITROSTAT) 0.4 MG SL tablet PLACE 1 TABLET (0.4 MG TOTAL) UNDER THE TONGUE EVERY 5 (FIVE) MINUTES AS NEEDED FOR CHEST PAIN. 25 tablet 6   pantoprazole (PROTONIX) 40 MG tablet Take 1 tablet (40 mg total) by mouth 2 (two) times daily. 180 tablet 3   potassium chloride SA (K-DUR,KLOR-CON) 20 MEQ tablet Take 20 mEq by mouth 2 (two) times daily.      rosuvastatin (CRESTOR) 40 MG tablet Take 1 tablet (40 mg total) by mouth daily. 90 tablet 3   No facility-administered medications prior to visit.    PAST MEDICAL HISTORY: Past Medical History:  Diagnosis Date   Anxiety    Arthritis    Blood transfusion without reported diagnosis    childhood   Carpal tunnel syndrome, bilateral 07/31/2015   Cataract    Cervical spondylosis without myelopathy    Chronic chest pain    Chronic kidney disease  Chronic low back pain    Chronotropic incompetence    CKD (chronic kidney disease), stage III (HCC)    Coronary artery disease    a. inf-post MI 2011 s/p DES to RCA. b. DES to RCA 06/2010. c. DES to LAD 2014, residual dz treated medically.   Depression    DVT (deep venous thrombosis) (HCC)    left   Esophageal stricture    GERD (gastroesophageal reflux disease)    Gross hematuria    Heart murmur    History of esophageal dilatation    FOR STRIUCTURE   History of gout    History of hiatal hernia    History of kidney stones    Hyperlipidemia    Hypertension    Hypothyroidism    Internal hemorrhoids    Irritable bowel syndrome    Mild aortic stenosis    by echo 2018   Myocardial infarction ([place])    Obesity    Orthostatic hypotension    Partial seizure  disorder ([place]) NEUROLOGIST-- DR Jannifer Franklin   NOCTURNAL   PONV (postoperative nausea and vomiting)    hard time getting iv site-had to do neck stick 2 yrs ago   Pulmonary fibrosis ([place])    S/P pericardial cyst excision    02-05-2013  benign   Seizures ([place])    none in yrs   SVT (supraventricular tachycardia) (HCC)    Trigger finger, acquired 09/30/2015   Right middle finger   Vitamin D deficiency     PAST SURGICAL HISTORY: Past Surgical History:  Procedure Laterality Date   BIOPSY OF MEDIASTINAL MASS N/A 02/05/2013   Procedure: RESECTION OF MEDIASTINAL MASS;  Surgeon: Ivin Poot, MD;  Location: [place];  Service: Thoracic;  Laterality: N/A;   CARDIAC CATHETERIZATION Right 01/24/2015   Procedure: right femoral ARTERIAL LINE INSERTION;  Surgeon: Ivin Poot, MD;  Location: [place];  Service: Thoracic;  Laterality: Right;   CARDIOVASCULAR STRESS TEST  11-22-2013  dr Irish Lack   normal lexiscan study/  no ischemia/  normal LVF and wall motion/ ef 81%   COLONOSCOPY  last one 2014   CORONARY ANGIOGRAM  03/10/2011   Procedure: CORONARY ANGIOGRAM;  Surgeon: Jettie Booze, MD;  Location: [facility] CATH LAB;  Service: Cardiovascular;;   CORONARY ANGIOPLASTY WITH STENT PLACEMENT  11-22-2009  dr Irish Lack   Acute inferoposterior MI/  ef 60%,  PCI with DES x1 to dRCA,  25%  mLAD   CORONARY ANGIOPLASTY WITH STENT PLACEMENT  06-26-2010  dr Daneen Schick   Cutting balloon angioplasty to dRCA with DES x1,  40% mLAD (non-obstructive CAD)   CYSTOSCOPY WITH RETROGRADE PYELOGRAM, URETEROSCOPY AND STENT PLACEMENT Right 02/13/2014   Procedure: CYSTOSCOPY WITH RETROGRADE PYELOGRAM, URETEROSCOPY AND STENT PLACEMENT;  Surgeon: Bernestine Amass, MD;  Location: [facility];  Service: Urology;  Laterality: Right;   ECTOPIC PREGNANCY SURGERY  YRS AGO   SALPINGECTOMY   ESOPHAGOGASTRODUODENOSCOPY N/A 02/15/2014   Procedure: ESOPHAGOGASTRODUODENOSCOPY (EGD);  Surgeon: Jerene Bears, MD;  Location: [facility];   Service: Endoscopy;  Laterality: N/A;   ESOPHAGOGASTRODUODENOSCOPY (EGD) WITH ESOPHAGEAL DILATION  05-20-2011   HOLMIUM LASER APPLICATION Right 09/11/4780   Procedure: HOLMIUM LASER APPLICATION;  Surgeon: Bernestine Amass, MD;  Location: [facility];  Service: Urology;  Laterality: Right;   LEFT HEART CATH AND CORONARY ANGIOGRAPHY N/A 09/12/2019   Procedure: LEFT HEART CATH AND CORONARY ANGIOGRAPHY;  Surgeon: Jettie Booze, MD;  Location: [place] CV LAB;  Service: Cardiovascular;  Laterality:  N/A;   LEFT HEART CATHETERIZATION WITH CORONARY ANGIOGRAM N/A 03/14/2012   Procedure: LEFT HEART CATHETERIZATION WITH CORONARY ANGIOGRAM;  Surgeon: Sueanne Margarita, MD;  Location: [place] CATH LAB;  Service: Cardiovascular;  Laterality: N/A;  Normal LM,  50% pLAD,  70% mLAD,  D2 50-70%, very tortuous LAD,  70-82% ostial LCFX,  50% in-stent restenosis of  dRCA and mRCA  stent and 50-70% ostial PDA,  normal LVSF, ef 60%   LUNG BIOPSY Right 01/24/2015   Procedure: RIGHT LUNG BIOPSY;  Surgeon: Ivin Poot, MD;  Location: [place];  Service: Thoracic;  Laterality: Right;   MEDIASTERNOTOMY N/A 02/05/2013   Procedure: MEDIAN STERNOTOMY;  Surgeon: Ivin Poot, MD;  Location: [place];  Service: Thoracic;  Laterality: N/A;   MEDIASTERNOTOMY Left 02/05/2013   NEEDLE GUIDED EXCISION BREAST CALCIFICATIONS Right 08-09-2008   PERCUTANEOUS CORONARY STENT INTERVENTION (PCI-S) N/A 04/03/2012   Procedure: PERCUTANEOUS CORONARY STENT INTERVENTION (PCI-S);  Surgeon: Jettie Booze, MD;  Location: [facility] CATH LAB;  Service: Cardiovascular;  Laterality: N/A;   Successful PCI  mLAD with 2.75x12 Promus stent, postdilated to >0.16mm   THORACOTOMY Right 01/24/2015   Procedure: RIGHT MINI/LIMITED THORACOTOMY;  Surgeon: Ivin Poot, MD;  Location: [facility] OR;  Service: Thoracic;  Laterality: Right;   TRANSTHORACIC ECHOCARDIOGRAM  11-17-2011   grade I diastolic dysfunction/  ef 55-60%   VIDEO BRONCHOSCOPY N/A 01/24/2015    Procedure: RIGHT VIDEO BRONCHOSCOPY;  Surgeon: Ivin Poot, MD;  Location: [place];  Service: Thoracic;  Laterality: N/A;    FAMILY HISTORY: Family History  Problem Relation Age of Onset   Breast cancer Sister    Heart disease Father    Hypertension Father    Emphysema Father    Coronary artery disease Father    Diabetes Mellitus I Father    Heart attack Mother    CVA Mother    Breast cancer Niece        niece   Colon cancer Neg Hx    Stomach cancer Neg Hx    Esophageal cancer Neg Hx    Rectal cancer Neg Hx     SOCIAL HISTORY: Social History   Socioeconomic History   Marital status: Married    Spouse name: Fritz Pickerel   Number of children: 0   Years of education: hs   Highest education level: Not on file  Occupational History   Occupation: Retired    Fish farm manager: RETIRED  Tobacco Use   Smoking status: Former    Packs/day: 0.30    Years: 3.00    Pack years: 0.90    Types: Cigarettes    Start date: 11/08/1964    Quit date: 11/09/1967    Years since quitting: 53.2   Smokeless tobacco: Never  Vaping Use   Vaping Use: Never used  Substance and Sexual Activity   Alcohol use: No    Alcohol/week: 0.0 standard drinks   Drug use: No   Sexual activity: Not on file  Other Topics Concern   Not on file  Social History Narrative   Lives at home, married   Left-handed   Daily caffeine use: coffee.   Social Determinants of Health   Financial Resource Strain: Not on file  Food Insecurity: Not on file  Transportation Needs: Not on file  Physical Activity: Not on file  Stress: Not on file  Social Connections: Not on file  Intimate Partner Violence: Not on file      PHYSICAL EXAM  Vitals:   02/02/21 1001  BP: (!) 143/90  Pulse: 65  Weight: 149 lb 3.2 oz (67.7 kg)  Height: 4\' 10"  (1.473 m)   Body mass index is 30.93 kg/m.  Generalized: Well developed, in no acute distress   Neurological examination  Mentation: Alert oriented to time, place, history taking.  Follows all commands speech and language fluent Cranial nerve II-XII: Pupils were equal round reactive to light. Extraocular movements were full, visual field were full on confrontational test. Facial sensation and strength were normal. Head turning and shoulder shrug  were normal and symmetric. Motor: The motor testing reveals 5 over 5 strength of all 4 extremities. Good symmetric motor tone is noted throughout.  Sensory: Sensory testing is intact to soft touch on all 4 extremities. No evidence of extinction is noted.  Coordination: Cerebellar testing reveals good finger-nose-finger and heel-to-shin bilaterally.  Gait and station: Gait is normal.  Reflexes: Deep tendon reflexes are symmetric and normal bilaterally.   DIAGNOSTIC DATA (LABS, IMAGING, TESTING) - I reviewed patient records, labs, notes, testing and imaging myself where available.  Lab Results  Component Value Date   WBC 9.3 09/04/2019   HGB 13.4 09/04/2019   HCT 40.7 09/04/2019   MCV 88 09/04/2019   PLT 314 09/04/2019      Component Value Date/Time   NA 139 09/04/2019 1623   K 4.5 09/04/2019 1623   CL 101 09/04/2019 1623   CO2 24 09/04/2019 1623   GLUCOSE 89 09/04/2019 1623   GLUCOSE 122 (H) 03/08/2018 0858   BUN 13 09/04/2019 1623   CREATININE 1.21 (H) 09/04/2019 1623   CREATININE 1.12 (H) 06/06/2015 0928   CALCIUM 9.4 09/04/2019 1623   PROT 8.3 (H) 03/08/2018 0858   PROT 6.8 11/03/2017 0859   ALBUMIN 3.8 03/08/2018 0858   ALBUMIN 4.0 11/03/2017 0859   AST 14 (L) 03/08/2018 0858   ALT 6 01/22/2020 1154   ALKPHOS 92 03/08/2018 0858   BILITOT 0.8 03/08/2018 0858   BILITOT 0.4 11/03/2017 0859   GFRNONAA 44 (L) 09/04/2019 1623   GFRAA 51 (L) 09/04/2019 1623   Lab Results  Component Value Date   CHOL 150 01/22/2020   HDL 71 01/22/2020   LDLCALC 62 01/22/2020   TRIG 94 01/22/2020   CHOLHDL 2.1 01/22/2020   Lab Results  Component Value Date   HGBA1C 6.5 (H) 10/15/2014   Lab Results  Component Value Date    VITAMINB12 281 01/30/2010   Lab Results  Component Value Date   TSH 1.063 08/18/2017      ASSESSMENT AND PLAN 76 y.o. year old female  has a past medical history of Anxiety, Arthritis, Blood transfusion without reported diagnosis, Carpal tunnel syndrome, bilateral (07/31/2015), Cataract, Cervical spondylosis without myelopathy, Chronic chest pain, Chronic kidney disease, Chronic low back pain, Chronotropic incompetence, CKD (chronic kidney disease), stage III ([place]), Coronary artery disease, Depression, DVT (deep venous thrombosis) ([place]), Esophageal stricture, GERD (gastroesophageal reflux disease), Gross hematuria, Heart murmur, History of esophageal dilatation, History of gout, History of hiatal hernia, History of kidney stones, Hyperlipidemia, Hypertension, Hypothyroidism, Internal hemorrhoids, Irritable bowel syndrome, Mild aortic stenosis, Myocardial infarction ([place]), Obesity, Orthostatic hypotension, Partial seizure disorder ([place]) (NEUROLOGIST-- DR Jannifer Franklin), PONV (postoperative nausea and vomiting), Pulmonary fibrosis ([place]), S/P pericardial cyst excision, Seizures ([place]), SVT (supraventricular tachycardia) ([place]), Trigger finger, acquired (09/30/2015), and Vitamin D deficiency. here with:  1.  Seizures  Continue Keppra 500 mg twice a day Advised if she has any seizure events to let us know Follow-up in 1 year or sooner if needed  Ward Givens, MSN, NP-C 02/02/2021, 10:31 AM [facility] [address], [place], [place] [address] [phone]

## 2021-02-02 NOTE — Patient Instructions (Signed)
Your Plan:  Continue Keppra 500 mg BID Call if you have any seizure events     Thank you for coming to see [place] at [facility]. I hope we have been able to provide you high quality care today.  You may receive a patient satisfaction survey over the next few weeks. We would appreciate your feedback and comments so that we may continue to improve ourselves and the health of our patients.

## 2021-02-03 ENCOUNTER — Ambulatory Visit
Admission: RE | Admit: 2021-02-03 | Discharge: 2021-02-03 | Disposition: A | Discharge status: Home / Self Care | Payer: PPO | Source: Ambulatory Visit | Attending: Family Medicine | Admitting: Family Medicine

## 2021-02-03 DIAGNOSIS — Z1231 Encounter for screening mammogram for malignant neoplasm of breast: Secondary | ICD-10-CM | POA: Diagnosis not present

## 2021-02-05 DIAGNOSIS — N2581 Secondary hyperparathyroidism of renal origin: Secondary | ICD-10-CM | POA: Diagnosis not present

## 2021-02-05 DIAGNOSIS — I471 Supraventricular tachycardia: Secondary | ICD-10-CM | POA: Diagnosis not present

## 2021-02-05 DIAGNOSIS — D692 Other nonthrombocytopenic purpura: Secondary | ICD-10-CM | POA: Diagnosis not present

## 2021-02-05 DIAGNOSIS — R7303 Prediabetes: Secondary | ICD-10-CM | POA: Diagnosis not present

## 2021-02-05 DIAGNOSIS — F419 Anxiety disorder, unspecified: Secondary | ICD-10-CM | POA: Diagnosis not present

## 2021-02-05 DIAGNOSIS — I129 Hypertensive chronic kidney disease with stage 1 through stage 4 chronic kidney disease, or unspecified chronic kidney disease: Secondary | ICD-10-CM | POA: Diagnosis not present

## 2021-02-05 DIAGNOSIS — N183 Chronic kidney disease, stage 3 unspecified: Secondary | ICD-10-CM | POA: Diagnosis not present

## 2021-02-05 DIAGNOSIS — I25118 Atherosclerotic heart disease of native coronary artery with other forms of angina pectoris: Secondary | ICD-10-CM | POA: Diagnosis not present

## 2021-02-05 DIAGNOSIS — J8410 Pulmonary fibrosis, unspecified: Secondary | ICD-10-CM | POA: Diagnosis not present

## 2021-02-05 DIAGNOSIS — F339 Major depressive disorder, recurrent, unspecified: Secondary | ICD-10-CM | POA: Diagnosis not present

## 2021-02-05 DIAGNOSIS — G40909 Epilepsy, unspecified, not intractable, without status epilepticus: Secondary | ICD-10-CM | POA: Diagnosis not present

## 2021-02-05 DIAGNOSIS — K219 Gastro-esophageal reflux disease without esophagitis: Secondary | ICD-10-CM | POA: Diagnosis not present

## 2021-02-24 DIAGNOSIS — I10 Essential (primary) hypertension: Secondary | ICD-10-CM | POA: Diagnosis not present

## 2021-02-24 DIAGNOSIS — E7800 Pure hypercholesterolemia, unspecified: Secondary | ICD-10-CM | POA: Diagnosis not present

## 2021-02-24 DIAGNOSIS — F331 Major depressive disorder, recurrent, moderate: Secondary | ICD-10-CM | POA: Diagnosis not present

## 2021-02-24 DIAGNOSIS — E039 Hypothyroidism, unspecified: Secondary | ICD-10-CM | POA: Diagnosis not present

## 2021-02-25 DIAGNOSIS — D2262 Melanocytic nevi of left upper limb, including shoulder: Secondary | ICD-10-CM | POA: Diagnosis not present

## 2021-02-25 DIAGNOSIS — L720 Epidermal cyst: Secondary | ICD-10-CM | POA: Diagnosis not present

## 2021-02-25 DIAGNOSIS — D2272 Melanocytic nevi of left lower limb, including hip: Secondary | ICD-10-CM | POA: Diagnosis not present

## 2021-02-25 DIAGNOSIS — L821 Other seborrheic keratosis: Secondary | ICD-10-CM | POA: Diagnosis not present

## 2021-02-25 DIAGNOSIS — L218 Other seborrheic dermatitis: Secondary | ICD-10-CM | POA: Diagnosis not present

## 2021-02-25 DIAGNOSIS — D1801 Hemangioma of skin and subcutaneous tissue: Secondary | ICD-10-CM | POA: Diagnosis not present

## 2021-02-25 DIAGNOSIS — L814 Other melanin hyperpigmentation: Secondary | ICD-10-CM | POA: Diagnosis not present

## 2021-02-25 DIAGNOSIS — D2271 Melanocytic nevi of right lower limb, including hip: Secondary | ICD-10-CM | POA: Diagnosis not present

## 2021-02-25 DIAGNOSIS — D225 Melanocytic nevi of trunk: Secondary | ICD-10-CM | POA: Diagnosis not present

## 2021-02-25 DIAGNOSIS — D485 Neoplasm of uncertain behavior of skin: Secondary | ICD-10-CM | POA: Diagnosis not present

## 2021-02-25 DIAGNOSIS — L910 Hypertrophic scar: Secondary | ICD-10-CM | POA: Diagnosis not present

## 2021-03-11 ENCOUNTER — Encounter: Payer: Self-pay | Admitting: Internal Medicine

## 2021-03-11 ENCOUNTER — Ambulatory Visit: Payer: PPO | Admitting: Internal Medicine

## 2021-03-11 VITALS — BP 140/80 | HR 72 | Ht <= 58 in | Wt 147.5 lb

## 2021-03-11 DIAGNOSIS — K219 Gastro-esophageal reflux disease without esophagitis: Secondary | ICD-10-CM | POA: Diagnosis not present

## 2021-03-11 DIAGNOSIS — E7800 Pure hypercholesterolemia, unspecified: Secondary | ICD-10-CM | POA: Diagnosis not present

## 2021-03-11 DIAGNOSIS — I10 Essential (primary) hypertension: Secondary | ICD-10-CM | POA: Diagnosis not present

## 2021-03-11 MED ORDER — PANTOPRAZOLE SODIUM 40 MG PO TBEC: 40.0000 mg | DELAYED_RELEASE_TABLET | Freq: Two times a day (BID) | ORAL | 3 refills | Status: DC

## 2021-03-11 NOTE — Patient Instructions (Addendum)
If you are age 75 or older, your body mass index should be between 23-30. Your Body mass index is 30.83 kg/m?Marland Kitchen If this is out of the aforementioned range listed, please consider follow up with your Primary Care Provider. ? ?If you are age 55 or younger, your body mass index should be between 19-25. Your Body mass index is 30.83 kg/m?Marland Kitchen If this is out of the aformentioned range listed, please consider follow up with your Primary Care Provider.  ? ?________________________________________________________ ? ?The [facility] providers would like to encourage you to use [facility] to communicate with providers for non-urgent requests or questions.  Due to long hold times on the telephone, sending your provider a message by [facility] may be a faster and more efficient way to get a response.  Please allow 48 business hours for a response.  Please remember that this is for non-urgent requests.  ?_______________________________________________________ ? ?We have sent the following medications to your pharmacy for you to pick up at your convenience:  Pantoprazole ? ?Please follow up in one year ?

## 2021-03-11 NOTE — Progress Notes (Signed)
HISTORY OF PRESENT ILLNESS:  [name] is a 76 y.o. female with multiple medical problems as listed below.  She presents today for follow-up regarding management of chronic GERD.  She also has chief complaint of left upper quadrant pain.  In terms of GERD, she is currently taking pantoprazole 40 mg twice [name].  With this particular dosage her symptoms are well controlled.  Rare breakthrough.  She does have chronic dysphagia due to both esophageal stricture and dysmotility.  She last underwent upper endoscopy with esophageal dilation January 2021.  She tells me that she is swallowing well without significant dysphagia.  She is up-to-date on colon cancer screening.  Negative for neoplasia examinations in 2011 and again in 2022.  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  Patient tells me that she has had sharp left upper quadrant pain since her pericardial cyst excision in 2015.  The discomfort is infrequent.  Typically lasts 2 minutes or less.  Makes her feel like it is difficult to breathe, or hurts with deep breath.  No other complaints.  She does request medication refill  REVIEW OF SYSTEMS:  All non-GI ROS negative unless otherwise stated in the HPI except for arthritis, back pain, visual change, depression, hearing problems, night sweats, sleeping problems, urinary leakage  Past Medical History:  Diagnosis Date   Anxiety    Arthritis    Blood transfusion without reported diagnosis    childhood   Carpal tunnel syndrome, bilateral 07/31/2015   Cataract    Cervical spondylosis without myelopathy    Chronic chest pain    Chronic kidney disease    Chronic low back pain    Chronotropic incompetence    CKD (chronic kidney disease), stage III ([place])    Coronary artery disease    a. inf-post MI 2011 s/p DES to RCA. b. DES to RCA 06/2010. c. DES to LAD 2014, residual dz treated medically.   Depression    DVT (deep venous thrombosis) (HCC)    left   Esophageal stricture    GERD  (gastroesophageal reflux disease)    Gross hematuria    Heart murmur    History of esophageal dilatation    FOR STRIUCTURE   History of gout    History of hiatal hernia    History of kidney stones    Hyperlipidemia    Hypertension    Hypothyroidism    Internal hemorrhoids    Irritable bowel syndrome    Mild aortic stenosis    by echo 2018   Myocardial infarction ([place])    Obesity    Orthostatic hypotension    Partial seizure disorder (HCC) NEUROLOGIST-- DR Jannifer Franklin   NOCTURNAL   PONV (postoperative nausea and vomiting)    hard time getting iv site-had to do neck stick 2 yrs ago   Pulmonary fibrosis (HCC)    S/P pericardial cyst excision    02-05-2013  benign   Seizures ([place])    none in yrs   SVT (supraventricular tachycardia) (HCC)    Trigger finger, acquired 09/30/2015   Right middle finger   Vitamin D deficiency     Past Surgical History:  Procedure Laterality Date   BIOPSY OF MEDIASTINAL MASS N/A 02/05/2013   Procedure: RESECTION OF MEDIASTINAL MASS;  Surgeon: Ivin Poot, MD;  Location: [place];  Service: Thoracic;  Laterality: N/A;   CARDIAC CATHETERIZATION Right 01/24/2015   Procedure: right femoral ARTERIAL LINE INSERTION;  Surgeon: Ivin Poot, MD;  Location: [place];  Service: Thoracic;  Laterality: Right;   CARDIOVASCULAR STRESS TEST  11-22-2013  dr Irish Lack   normal lexiscan study/  no ischemia/  normal LVF and wall motion/ ef 81%   COLONOSCOPY  last one 2014   CORONARY ANGIOGRAM  03/10/2011   Procedure: CORONARY ANGIOGRAM;  Surgeon: Jettie Booze, MD;  Location: [facility] CATH LAB;  Service: Cardiovascular;;   CORONARY ANGIOPLASTY WITH STENT PLACEMENT  11-22-2009  dr Irish Lack   Acute inferoposterior MI/  ef 60%,  PCI with DES x1 to dRCA,  25%  mLAD   CORONARY ANGIOPLASTY WITH STENT PLACEMENT  06-26-2010  dr Daneen Schick   Cutting balloon angioplasty to dRCA with DES x1,  40% mLAD (non-obstructive CAD)   CYSTOSCOPY WITH RETROGRADE PYELOGRAM, URETEROSCOPY AND  STENT PLACEMENT Right 02/13/2014   Procedure: CYSTOSCOPY WITH RETROGRADE PYELOGRAM, URETEROSCOPY AND STENT PLACEMENT;  Surgeon: Bernestine Amass, MD;  Location[facility];  Service: Urology;  Laterality: Right;   ECTOPIC PREGNANCY SURGERY  YRS AGO   SALPINGECTOMY   ESOPHAGOGASTRODUODENOSCOPY N/A 02/15/2014   Procedure: ESOPHAGOGASTRODUODENOSCOPY (EGD);  Surgeon: Jerene Bears, MD;  Location: [facility];  Service: Endoscopy;  Laterality: N/A;   ESOPHAGOGASTRODUODENOSCOPY (EGD) WITH ESOPHAGEAL DILATION  05-20-2011   HOLMIUM LASER APPLICATION Right 0/06/2374   Procedure: HOLMIUM LASER APPLICATION;  Surgeon: Bernestine Amass, MD;  Location: [facility];  Service: Urology;  Laterality: Right;   LEFT HEART CATH AND CORONARY ANGIOGRAPHY N/A 09/12/2019   Procedure: LEFT HEART CATH AND CORONARY ANGIOGRAPHY;  Surgeon: Jettie Booze, MD;  Location: [place] CV LAB;  Service: Cardiovascular;  Laterality: N/A;   LEFT HEART CATHETERIZATION WITH CORONARY ANGIOGRAM N/A 03/14/2012   Procedure: LEFT HEART CATHETERIZATION WITH CORONARY ANGIOGRAM;  Surgeon: Sueanne Margarita, MD;  Location: [place] CATH LAB;  Service: Cardiovascular;  Laterality: N/A;  Normal LM,  50% pLAD,  70% mLAD,  D2 50-70%, very tortuous LAD,  70-82% ostial LCFX,  50% in-stent restenosis of  dRCA and mRCA  stent and 50-70% ostial PDA,  normal LVSF, ef 60%   LUNG BIOPSY Right 01/24/2015   Procedure: RIGHT LUNG BIOPSY;  Surgeon: Ivin Poot, MD;  Location: [place];  Service: Thoracic;  Laterality: Right;   MEDIASTERNOTOMY N/A 02/05/2013   Procedure: MEDIAN STERNOTOMY;  Surgeon: Ivin Poot, MD;  Location: [place];  Service: Thoracic;  Laterality: N/A;   MEDIASTERNOTOMY Left 02/05/2013   NEEDLE GUIDED EXCISION BREAST CALCIFICATIONS Right 08-09-2008   PERCUTANEOUS CORONARY STENT INTERVENTION (PCI-S) N/A 04/03/2012   Procedure: PERCUTANEOUS CORONARY STENT INTERVENTION (PCI-S);  Surgeon: Jettie Booze, MD;  Location: [facility]  CATH LAB;  Service: Cardiovascular;  Laterality: N/A;   Successful PCI  mLAD with 2.75x12 Promus stent, postdilated to >0.50mm   THORACOTOMY Right 01/24/2015   Procedure: RIGHT MINI/LIMITED THORACOTOMY;  Surgeon: Ivin Poot, MD;  Location: [place];  Service: Thoracic;  Laterality: Right;   TRANSTHORACIC ECHOCARDIOGRAM  11-17-2011   grade I diastolic dysfunction/  ef 55-60%   VIDEO BRONCHOSCOPY N/A 01/24/2015   Procedure: RIGHT VIDEO BRONCHOSCOPY;  Surgeon: Ivin Poot, MD;  Location: [facility] OR;  Service: Thoracic;  Laterality: N/A;    Social History [name]  reports that she quit smoking about 53 years ago. Her smoking use included cigarettes. She started smoking about 56 years ago. She has a 0.90 pack-year smoking history. She has never used smokeless tobacco. She reports that she does not drink alcohol and does not use drugs.  family history includes Breast  cancer in her niece and sister; CVA in her mother; Coronary artery disease in her father; Diabetes Mellitus I in her father; Emphysema in her father; Heart attack in her mother; Heart disease in her father; Hypertension in her father.  No Known Allergies     PHYSICAL EXAMINATION: Vital signs: BP 140/80 (BP Location: Left Arm, Patient Position: Sitting, Cuff Size: Normal)    Pulse 72    Ht 4\' 10"  (1.473 m) Comment: height measured without shoes   Wt 147 lb 8 oz (66.9 kg)    BMI 30.83 kg/m   Constitutional: generally well-appearing, no acute distress Psychiatric: alert and oriented x3, cooperative Eyes: extraocular movements intact, anicteric, conjunctiva pink Mouth: oral pharynx moist, no lesions Neck: supple no lymphadenopathy Cardiovascular: heart regular rate and rhythm, no murmur Lungs: clear to auscultation bilaterally Abdomen: soft, obese, nontender, nondistended, no obvious ascites, no peritoneal signs, normal bowel sounds, no organomegaly Rectal: Omitted Extremities: no clubbing, cyanosis, or lower extremity edema  bilaterally Skin: no lesions on visible extremities Neuro: No focal deficits.  Cranial nerves intact  ASSESSMENT:  1.  GERD.  Symptoms require twice [name] PPI for control. 2.  History of esophageal stricture.  Asymptomatic post dilation on PPI 3.  Transient fleeting left upper quadrant discomfort as described.  Most consistent with musculoskeletal. 4.  Obesity 5.  General medical problems  PLAN:  1.  Reflux precautions 2.  Weight loss 3.  Continue twice [name] PPI.  Refilled.  Medication risk reviewed 4.  Reassurance regarding fleeting left upper quadrant discomfort 5.  Routine office follow-up 1 year

## 2021-03-12 ENCOUNTER — Other Ambulatory Visit: Payer: PPO

## 2021-03-12 ENCOUNTER — Ambulatory Visit (HOSPITAL_COMMUNITY): Payer: PPO | Attending: Cardiology

## 2021-03-12 ENCOUNTER — Other Ambulatory Visit: Payer: Self-pay

## 2021-03-12 DIAGNOSIS — I951 Orthostatic hypotension: Secondary | ICD-10-CM | POA: Diagnosis not present

## 2021-03-12 DIAGNOSIS — I350 Nonrheumatic aortic (valve) stenosis: Secondary | ICD-10-CM

## 2021-03-12 DIAGNOSIS — Q248 Other specified congenital malformations of heart: Secondary | ICD-10-CM

## 2021-03-12 DIAGNOSIS — I2510 Atherosclerotic heart disease of native coronary artery without angina pectoris: Secondary | ICD-10-CM

## 2021-03-12 DIAGNOSIS — E7800 Pure hypercholesterolemia, unspecified: Secondary | ICD-10-CM | POA: Diagnosis not present

## 2021-03-12 DIAGNOSIS — I10 Essential (primary) hypertension: Secondary | ICD-10-CM

## 2021-03-12 DIAGNOSIS — I471 Supraventricular tachycardia, unspecified: Secondary | ICD-10-CM

## 2021-03-12 LAB — LIPID PANEL
Chol/HDL Ratio: 2.3 ratio (ref 0.0–4.4)
Cholesterol, Total: 147 mg/dL (ref 100–199)
HDL: 63 mg/dL (ref >39)
LDL Chol Calc (NIH): 66 mg/dL (ref 0–99)
Triglycerides: 100 mg/dL (ref 0–149)
VLDL Cholesterol Cal: 18 mg/dL (ref 5–40)

## 2021-03-12 LAB — ECHOCARDIOGRAM COMPLETE
AR max vel: 1 cm2
AV Area VTI: 1.02 cm2
AV Area mean vel: 0.92 cm2
AV Mean grad: 10 mmHg
AV Peak grad: 14.1 mmHg
Ao pk vel: 1.88 m/s
Area-P 1/2: 4.08 cm2
S' Lateral: 2.5 cm

## 2021-03-12 LAB — ALT: ALT: 8 IU/L (ref 0–32)

## 2021-04-25 ENCOUNTER — Other Ambulatory Visit: Payer: Self-pay | Admitting: Cardiology

## 2021-04-28 DIAGNOSIS — F331 Major depressive disorder, recurrent, moderate: Secondary | ICD-10-CM | POA: Diagnosis not present

## 2021-04-28 DIAGNOSIS — N183 Chronic kidney disease, stage 3 unspecified: Secondary | ICD-10-CM | POA: Diagnosis not present

## 2021-04-28 DIAGNOSIS — I2510 Atherosclerotic heart disease of native coronary artery without angina pectoris: Secondary | ICD-10-CM | POA: Diagnosis not present

## 2021-04-28 DIAGNOSIS — E039 Hypothyroidism, unspecified: Secondary | ICD-10-CM | POA: Diagnosis not present

## 2021-04-28 DIAGNOSIS — I25119 Atherosclerotic heart disease of native coronary artery with unspecified angina pectoris: Secondary | ICD-10-CM | POA: Diagnosis not present

## 2021-04-28 DIAGNOSIS — K219 Gastro-esophageal reflux disease without esophagitis: Secondary | ICD-10-CM | POA: Diagnosis not present

## 2021-04-28 DIAGNOSIS — I10 Essential (primary) hypertension: Secondary | ICD-10-CM | POA: Diagnosis not present

## 2021-06-06 ENCOUNTER — Other Ambulatory Visit: Payer: Self-pay | Admitting: Cardiology

## 2021-06-25 DIAGNOSIS — F324 Major depressive disorder, single episode, in partial remission: Secondary | ICD-10-CM | POA: Diagnosis not present

## 2021-06-25 DIAGNOSIS — F4321 Adjustment disorder with depressed mood: Secondary | ICD-10-CM | POA: Diagnosis not present

## 2021-07-16 DIAGNOSIS — N183 Chronic kidney disease, stage 3 unspecified: Secondary | ICD-10-CM | POA: Diagnosis not present

## 2021-07-16 DIAGNOSIS — F4321 Adjustment disorder with depressed mood: Secondary | ICD-10-CM | POA: Diagnosis not present

## 2021-07-16 DIAGNOSIS — F331 Major depressive disorder, recurrent, moderate: Secondary | ICD-10-CM | POA: Diagnosis not present

## 2021-07-24 DIAGNOSIS — K219 Gastro-esophageal reflux disease without esophagitis: Secondary | ICD-10-CM | POA: Diagnosis not present

## 2021-07-24 DIAGNOSIS — E7800 Pure hypercholesterolemia, unspecified: Secondary | ICD-10-CM | POA: Diagnosis not present

## 2021-07-24 DIAGNOSIS — F329 Major depressive disorder, single episode, unspecified: Secondary | ICD-10-CM | POA: Diagnosis not present

## 2021-07-24 DIAGNOSIS — N183 Chronic kidney disease, stage 3 unspecified: Secondary | ICD-10-CM | POA: Diagnosis not present

## 2021-07-24 DIAGNOSIS — I10 Essential (primary) hypertension: Secondary | ICD-10-CM | POA: Diagnosis not present

## 2021-07-24 DIAGNOSIS — E039 Hypothyroidism, unspecified: Secondary | ICD-10-CM | POA: Diagnosis not present

## 2021-07-28 ENCOUNTER — Telehealth: Payer: Self-pay | Admitting: Internal Medicine

## 2021-07-28 ENCOUNTER — Telehealth: Payer: Self-pay | Admitting: Cardiology

## 2021-07-28 NOTE — Telephone Encounter (Signed)
Inbound call from patient stating that she has been having chest pain and is seeking advice if there is anything she can take to help. Patient stated that's she lost her husband and it may be stress from that, but just wanted to discuss with the nurse. Please advise.

## 2021-07-28 NOTE — Telephone Encounter (Signed)
Pt states her husband recently passed away and she has been having chest pain. She states she is taking her nitroglycerin and wanted to know if that was ok. Discussed with her she needs to talk with her cardiologist about this, she state she has called them and is waiting on a call back. She will talk to cardiology.

## 2021-07-28 NOTE — Telephone Encounter (Signed)
Pt c/o of Chest Pain: STAT if CP now or developed within 24 hours  1. Are you having CP right now? No  2. Are you experiencing any other symptoms (ex. SOB, nausea, vomiting, sweating)? Depression   3. How long have you been experiencing CP? Since Mid May  4. Is your CP continuous or coming and going? Come and go  5. Have you taken Nitroglycerin? Yes.   Pt state they need a call back to discuss if she needs a sooner appt than her yearly f/u. She says that she has been experiencing chest pain along with depression since her husband was in hospital and after he passed away on 2022-06-17. She states that she thought to call to get advice on appointment since she has had to use Nitroglycerin.   ?

## 2021-07-28 NOTE — Telephone Encounter (Signed)
Patient reports that she has been getting chest pain at night ever since her husband passed away. She states that it is a dull aching pain that occurs when she lies down. Does not occur with exertion. She denies eating prior to lying down. She states she has taken nitroglycerin several times which eased the pain. Otherwise pain will resolve on its own. She states she has begun to sweat several times with the pain. She denies any shortness of breath. She states it is similar to pain to what she has had in the past during stress. She does report having some slight dizziness at times when moving around. She states that she does stay hydrated. She is also being followed for her depression by her PCP. There is a nurse that comes out to her house to help her with her meds. The nurse listened to her heart and lungs and told her they sounded good. Advised to continue on current medications and seeking help for her depression. She is aware of how to use nitroglycerin and when to call 911 for chest pain. Advised that I would make Dr. Radford Pax aware of her symptoms and let her know if she had any further recommendations.

## 2021-07-29 NOTE — Telephone Encounter (Signed)
Left message for patient to call back  

## 2021-08-06 DIAGNOSIS — F331 Major depressive disorder, recurrent, moderate: Secondary | ICD-10-CM | POA: Diagnosis not present

## 2021-08-13 DIAGNOSIS — E039 Hypothyroidism, unspecified: Secondary | ICD-10-CM | POA: Diagnosis not present

## 2021-08-13 DIAGNOSIS — N189 Chronic kidney disease, unspecified: Secondary | ICD-10-CM | POA: Diagnosis not present

## 2021-08-13 DIAGNOSIS — E200 Idiopathic hypoparathyroidism: Secondary | ICD-10-CM | POA: Diagnosis not present

## 2021-08-13 DIAGNOSIS — E1165 Type 2 diabetes mellitus with hyperglycemia: Secondary | ICD-10-CM | POA: Diagnosis not present

## 2021-08-13 DIAGNOSIS — I10 Essential (primary) hypertension: Secondary | ICD-10-CM | POA: Diagnosis not present

## 2021-08-24 NOTE — Telephone Encounter (Signed)
Seems like encounter was open in error so closing encounter.  

## 2021-09-13 ENCOUNTER — Encounter (HOSPITAL_COMMUNITY): Payer: Self-pay | Admitting: Emergency Medicine

## 2021-09-13 ENCOUNTER — Other Ambulatory Visit: Payer: Self-pay

## 2021-09-13 ENCOUNTER — Emergency Department (HOSPITAL_COMMUNITY)
Admission: EM | Admit: 2021-09-13 | Discharge: 2021-09-13 | Discharge status: Against Medical Advice | Payer: PPO | Attending: Emergency Medicine | Admitting: Emergency Medicine

## 2021-09-13 ENCOUNTER — Emergency Department (HOSPITAL_COMMUNITY): Payer: PPO

## 2021-09-13 DIAGNOSIS — W01198A Fall on same level from slipping, tripping and stumbling with subsequent striking against other object, initial encounter: Secondary | ICD-10-CM | POA: Diagnosis not present

## 2021-09-13 DIAGNOSIS — I10 Essential (primary) hypertension: Secondary | ICD-10-CM | POA: Insufficient documentation

## 2021-09-13 DIAGNOSIS — S0101XA Laceration without foreign body of scalp, initial encounter: Secondary | ICD-10-CM | POA: Insufficient documentation

## 2021-09-13 DIAGNOSIS — Z79899 Other long term (current) drug therapy: Secondary | ICD-10-CM | POA: Diagnosis not present

## 2021-09-13 DIAGNOSIS — Y93K1 Activity, walking an animal: Secondary | ICD-10-CM | POA: Diagnosis not present

## 2021-09-13 DIAGNOSIS — S0000XA Unspecified superficial injury of scalp, initial encounter: Secondary | ICD-10-CM | POA: Diagnosis present

## 2021-09-13 DIAGNOSIS — M47812 Spondylosis without myelopathy or radiculopathy, cervical region: Secondary | ICD-10-CM | POA: Diagnosis not present

## 2021-09-13 DIAGNOSIS — S199XXA Unspecified injury of neck, initial encounter: Secondary | ICD-10-CM | POA: Diagnosis not present

## 2021-09-13 DIAGNOSIS — R778 Other specified abnormalities of plasma proteins: Secondary | ICD-10-CM

## 2021-09-13 LAB — CBC WITH DIFFERENTIAL/PLATELET
Abs Immature Granulocytes: 0.04 10*3/uL (ref 0.00–0.07)
Basophils Absolute: 0.1 10*3/uL (ref 0.0–0.1)
Basophils Relative: 1 %
Eosinophils Absolute: 0.1 10*3/uL (ref 0.0–0.5)
Eosinophils Relative: 1 %
HCT: 40.6 % (ref 36.0–46.0)
Hemoglobin: 12.9 g/dL (ref 12.0–15.0)
Immature Granulocytes: 0 %
Lymphocytes Relative: 12 %
Lymphs Abs: 1.4 10*3/uL (ref 0.7–4.0)
MCH: 28.5 pg (ref 26.0–34.0)
MCHC: 31.8 g/dL (ref 30.0–36.0)
MCV: 89.6 fL (ref 80.0–100.0)
Monocytes Absolute: 1.3 10*3/uL — ABNORMAL HIGH (ref 0.1–1.0)
Monocytes Relative: 11 %
Neutro Abs: 8.7 10*3/uL — ABNORMAL HIGH (ref 1.7–7.7)
Neutrophils Relative %: 75 %
Platelets: 269 10*3/uL (ref 150–400)
RBC: 4.53 MIL/uL (ref 3.87–5.11)
RDW: 12.9 % (ref 11.5–15.5)
WBC: 11.6 10*3/uL — ABNORMAL HIGH (ref 4.0–10.5)
nRBC: 0 % (ref 0.0–0.2)

## 2021-09-13 LAB — BASIC METABOLIC PANEL
Anion gap: 8 (ref 5–15)
BUN: 14 mg/dL (ref 8–23)
CO2: 22 mmol/L (ref 22–32)
Calcium: 9.8 mg/dL (ref 8.9–10.3)
Chloride: 113 mmol/L — ABNORMAL HIGH (ref 98–111)
Creatinine, Ser: 1.39 mg/dL — ABNORMAL HIGH (ref 0.44–1.00)
GFR, Estimated: 40 mL/min — ABNORMAL LOW (ref >60)
Glucose, Bld: 116 mg/dL — ABNORMAL HIGH (ref 70–99)
Potassium: 3.8 mmol/L (ref 3.5–5.1)
Sodium: 143 mmol/L (ref 135–145)

## 2021-09-13 LAB — TROPONIN I (HIGH SENSITIVITY)
Troponin I (High Sensitivity): 19 ng/L — ABNORMAL HIGH (ref <18)
Troponin I (High Sensitivity): 57 ng/L — ABNORMAL HIGH (ref <18)

## 2021-09-13 LAB — CBG MONITORING, ED: Glucose-Capillary: 114 mg/dL — ABNORMAL HIGH (ref 70–99)

## 2021-09-13 MED ORDER — LIDOCAINE-EPINEPHRINE (PF) 2 %-1:200000 IJ SOLN
20.0000 mL | Freq: Once | INTRAMUSCULAR | Status: AC
  Administered 2021-09-13: 20 mL
  Filled 2021-09-13: qty 20

## 2021-09-13 NOTE — ED Provider Notes (Signed)
[facility] DEPT Provider Note   CSN: [id number] Arrival date & time: 09/13/21  9326     History  Chief Complaint  Patient presents with   Fall   Head Laceration    [name] is a 76 y.o. female with a past medical history of pulmonary fibrosis, seizures, hypertension, hyperlipidemia and paroxysmal SVT presenting today after a fall.  She reports that she got dizzy when she was coming in after walking her dog and fell face first into the door frame.  Denies LOC.  Is not on blood thinners.  Reports she was able to call family members and then ambulate to the neighbor's house and asked to be brought to the hospital.  Takes medication chronically for seizures.  Denies any aura and says that this is definitely not a seizure.  Also says that her SVT is well controlled, no palpitations, chest pain or other discomfort prior to becoming dizzy.   Fall Pertinent negatives include no chest pain and no shortness of breath.  Head Laceration Pertinent negatives include no chest pain and no shortness of breath.       Home Medications Prior to Admission medications   Medication Sig Start Date End Date Taking? Authorizing Provider  ALPRAZolam (XANAX) 0.25 MG tablet Take 0.25 mg by mouth 2 (two) times daily.     [provider]  aspirin EC 81 MG tablet Take 81 mg by mouth daily.    [provider]  calcitRIOL (ROCALTROL) 0.25 MCG capsule Take 0.25 mcg by mouth daily.    [provider]  [employer] D3 50 MCG (2000 UT) CAPS Take 2,000 Units by mouth daily. 02/11/18   [provider]  diltiazem (CARDIZEM LA) 180 MG 24 hr tablet Take 180 mg by mouth daily.    [provider]  DULoxetine (CYMBALTA) 60 MG capsule Take 60 mg by mouth daily.    [provider]  ferrous sulfate 325 (65 FE) MG tablet Take 325 mg by mouth daily with breakfast.     [provider]  HYDROcodone-acetaminophen (NORCO) 10-325 MG tablet 1 tablet  as needed    [provider]  levETIRAcetam (KEPPRA) 500 MG tablet TAKE 1 TABLET BY MOUTH TWICE A DAY 12/24/20   Suzzanne Cloud, NP  levothyroxine (SYNTHROID) 88 MCG tablet Take 88 mcg by mouth every morning. 01/29/21   [provider]  Magnesium Chloride 64 MG TBEC Take 128 mg by mouth 2 (two) times daily.  01/30/19   [provider]  nebivolol (BYSTOLIC) 5 MG tablet Take 5 mg by mouth daily.    [provider]  nitroGLYCERIN (NITROSTAT) 0.4 MG SL tablet PLACE 1 TABLET (0.4 MG TOTAL) UNDER THE TONGUE EVERY 5 (FIVE) MINUTES AS NEEDED FOR CHEST PAIN. 07/17/20   Sueanne Margarita, MD  pantoprazole (PROTONIX) 40 MG tablet Take 1 tablet (40 mg total) by mouth 2 (two) times daily. 03/11/21   Irene Shipper, MD  potassium chloride SA (K-DUR,KLOR-CON) 20 MEQ tablet Take 20 mEq by mouth 2 (two) times daily.  09/02/17   [provider]  rosuvastatin (CRESTOR) 40 MG tablet Take 1 tablet (40 mg total) by mouth daily. 01/29/21 04/29/21  Sueanne Margarita, MD      Allergies    Patient has no known allergies.    Review of Systems   Review of Systems  Constitutional:  Negative for chills and fever.  Respiratory:  Negative for shortness of breath.   Cardiovascular:  Negative  for chest pain.  Musculoskeletal:  Negative for neck pain and neck stiffness.  Neurological:  Positive for light-headedness.    Physical Exam Updated Vital Signs BP (!) 150/88 (BP Location: Right Arm)   Pulse 90   Temp (!) 97 F (36.1 C) (Oral)   Resp 16   SpO2 98%  Physical Exam Vitals and nursing note reviewed.  Constitutional:      Appearance: Normal appearance.  HENT:     Head: Normocephalic.     Comments: 2.5 inch laceration to patient's anterior skull into her forehead.  Bleeding controlled Eyes:     General: No scleral icterus.    Conjunctiva/sclera: Conjunctivae normal.  Cardiovascular:     Rate and Rhythm: Normal rate and regular rhythm.  Pulmonary:     Effort: Pulmonary effort  is normal. No respiratory distress.  Skin:    Findings: No rash.  Neurological:     Mental Status: She is alert and oriented to person, place, and time.  Psychiatric:        Mood and Affect: Mood normal.     ED Results / Procedures / Treatments   Labs (all labs ordered are listed, but only abnormal results are displayed) Labs Reviewed  CBC WITH DIFFERENTIAL/PLATELET - Abnormal; Notable for the following components:      Result Value   WBC 11.6 (*)    Neutro Abs 8.7 (*)    Monocytes Absolute 1.3 (*)    All other components within normal limits  BASIC METABOLIC PANEL - Abnormal; Notable for the following components:   Chloride 113 (*)    Glucose, Bld 116 (*)    Creatinine, Ser 1.39 (*)    GFR, Estimated 40 (*)    All other components within normal limits  CBG MONITORING, ED - Abnormal; Notable for the following components:   Glucose-Capillary 114 (*)    All other components within normal limits  TROPONIN I (HIGH SENSITIVITY) - Abnormal; Notable for the following components:   Troponin I (High Sensitivity) 19 (*)    All other components within normal limits  TROPONIN I (HIGH SENSITIVITY)    EKG EKG Interpretation  Date/Time:  Sunday September 13 2021 10:17:57 EDT Ventricular Rate:  63 PR Interval:  179 QRS Duration: 97 QT Interval:  453 QTC Calculation: 464 R Axis:   206 Text Interpretation: Right and left arm electrode reversal, interpretation assumes no reversal Ectopic atrial rhythm Anterior infarct, old Abnormal T, consider ischemia, lateral leads ST elevation, consider inferior injury agree. ischemic changes not seen on previous. Confirmed by Charlesetta Shanks [id number]) on 09/13/2021 11:03:22 AM  Radiology No results found.  Procedures .Marland KitchenLaceration Repair  Date/Time: 09/13/2021 11:06 AM  Performed by: Rhae Hammock, PA-C Authorized by: Rhae Hammock, PA-C   Consent:    Consent obtained:  Verbal   Consent given by:  Patient   Risks, benefits, and  alternatives were discussed: yes     Risks discussed:  Infection, pain, need for additional repair and poor cosmetic result Universal protocol:    Imaging studies available: yes     Patient identity confirmed:  Verbally with patient Anesthesia:    Anesthesia method:  Local infiltration   Local anesthetic:  Lidocaine 2% WITH epi Laceration details:    Location:  Face   Face location:  Forehead   Length (cm):  6.5 Exploration:    Limited defect created (wound extended): yes     Hemostasis achieved with:  Direct pressure   Imaging outcome: foreign body noted  Wound exploration: wound explored through full range of motion and entire depth of wound visualized     Contaminated: yes   Treatment:    Area cleansed with:  Povidone-iodine and saline   Amount of cleaning:  Extensive   Irrigation solution:  Sterile saline   Irrigation volume:  500cc   Irrigation method:  Syringe and pressure wash   Debridement:  None Skin repair:    Repair method:  Sutures   Suture size:  4-0   Suture material:  Prolene   Suture technique:  Simple interrupted   Number of sutures:  7 Approximation:    Approximation:  Close Repair type:    Repair type:  Intermediate Post-procedure details:    Dressing:  Antibiotic ointment   Procedure completion:  Tolerated well, no immediate complications    Medications Ordered in ED Medications  lidocaine-EPINEPHrine (XYLOCAINE W/EPI) 2 %-1:200000 (PF) injection 20 mL (20 mLs Infiltration Given 09/13/21 1155)    ED Course/ Medical Decision Making/ A&P                           Medical Decision Making Amount and/or Complexity of Data Reviewed Labs: ordered. Radiology: ordered.  Risk Prescription drug management.   This is a 76 year old who presents to the ED for concern of a fall. The etiology differential includes but is not limited to syncope, seizure, hypoglycemia, arrhythmia, intoxication, musculoskeletal injury, electrolyte abnormality, CVA.   This  is not an exhaustive differential.    Past Medical History / Co-morbidities / Social History: Convulsions, well controlled with Keppra.  Reports she has not had a seizure in many years.   SVT for which she continues to take diltiazem and Bystolic   Additional history: Per chart review patient follows with Dr. Radford Pax with cardiology for hypertension and hypercholesterolemia   Physical Exam: Pertinent physical exam findings include Large laceration to the skull and anterior forehead  Lab Tests: I ordered, and personally interpreted labs.  The pertinent results include: Baseline kidney function  Uptrending troponin.  19 to 50s.    Imaging Studies: I ordered and independently visualized and interpreted CT head and C-spine and I agree with the radiologist that there are no acute findings   Cardiac Monitoring:  The patient was maintained on a cardiac monitor.  My attending physician Dr. Vallery Ridge viewed and interpreted the cardiac monitored which showed an underlying rhythm of: Sinus   Medications: I ordered medication including patient declined any pain medication.  Did use lidocaine with epi for suture repair  Consults: Patient declined further evaluation by cardiology  MDM/Disposition: This is a 76 year old female presenting with a fall.  Etiology was unknown.  Imaging negative.  Laceration was repaired.  Patient did have an abnormal EKG per my attending physician.  Troponins were then ordered.  She has an uptrending troponin.  I spoke with her about this and she declined cardiology consult and says that she would like to go home.  She says that she sees her cardiologist this week and "would like to go home now."  Patient has no history of dementia or cognitive disorders.  Is alert and oriented x3 and able to make her own decisions.Neighbors at bedside who seems to agree that patient is at her normal baseline which is alert and oriented and that she may be discharged home.  Neighbor says  she will continue to look out for her.  Per her request, patient was discharged home AMA.  She understands she needs to have the stitches out in 7 days.    I discussed this case with my attending physician Dr. Johnney Killian who cosigned this note including patient's presenting symptoms, physical exam, and planned diagnostics and interventions. Attending physician stated agreement with plan or made changes to plan which were implemented.      Final Clinical Impression(s) / ED Diagnoses Final diagnoses:  Laceration of scalp, initial encounter    Rx / DC Orders ED Discharge Orders     None      Results and diagnoses were explained to the patient. Return precautions discussed in full. Patient had no additional questions and expressed complete understanding.   This chart was dictated using voice recognition software.  Despite best efforts to proofread,  errors can occur which can change the documentation meaning.    Rhae Hammock, PA-C 09/13/21 1337    Charlesetta Shanks, MD 09/13/21 [phone]

## 2021-09-13 NOTE — ED Notes (Signed)
Patient reports that she lost her balance while walking and fell, hitting the door. Patient does take Aspirin. Patient denies any blurred vision or nausea. Patient denies LOC.

## 2021-09-13 NOTE — ED Triage Notes (Signed)
Pt reports falling down & hitting head today. Pt denies LOC. States she is on thinners. Bleeding controlled in triage. Lac to head noted.

## 2021-09-13 NOTE — Discharge Instructions (Signed)
Your heart enzymes are trending upwards.  This is a reason that I would like to speak with cardiology.  You declined consult at this time and said that you would like to go home.  This is AGAINST MEDICAL ADVICE.  Please return with any chest pain, shortness of breath, back pain or any worsening symptoms.  Your stitches need to come out in 7 days.  Please get in touch with your cardiologist first thing Tuesday morning.  It was a pleasure to meet you and I hope that you feel better.

## 2021-09-13 NOTE — ED Provider Notes (Signed)
I provided a substantive portion of the care of this patient.  I personally performed the entirety of the medical decision making for this encounter.  EKG Interpretation  Date/Time:  Sunday September 13 2021 10:17:57 EDT Ventricular Rate:  63 PR Interval:  179 QRS Duration: 97 QT Interval:  453 QTC Calculation: 464 R Axis:   206 Text Interpretation: Right and left arm electrode reversal, interpretation assumes no reversal Ectopic atrial rhythm Anterior infarct, old Abnormal T, consider ischemia, lateral leads ST elevation, consider inferior injury agree. ischemic changes not seen on previous. Confirmed by Charlesetta Shanks [phone]) on 09/13/2021 11:03:22 AM    Patient reports that she got dizzy.  She reports has been having problems with dizzy spells for a while.  She had just taken the dog out for a walk.  She reports she always gets somewhat short of breath with exertion taking the dog out.  She reports when the dizzy spell hit her she could not use her arms or legs to break her fall.  She knew that she was going to fall.  She struck her head on the edge of a door.  She got a big laceration.  Patient denies that she had any chest pain in association with this episode.  She denied chest pain afterwards.  Reports she feels back to normal.  Patient is alert with GCS of 15.  Laceration on her forehead has been repaired.  No significant surrounding hematoma or other facial trauma.  Heart is regular without murmur gallop.  Lungs are clear.  Abdomen soft nondistended.  Lower extremities without peripheral edema or calf tenderness.  EKG reviewed by myself has some changes compared to previous with ST coving and T wave inversion in 1 2 and aVL not seen previously.  We will proceed with adding troponins.  Reports she has follow-up with cardiology this upcoming week.   Charlesetta Shanks, MD 09/13/21 [phone]

## 2021-09-15 ENCOUNTER — Encounter (HOSPITAL_COMMUNITY): Payer: Self-pay

## 2021-09-15 ENCOUNTER — Observation Stay (HOSPITAL_COMMUNITY)
Admission: EM | Admit: 2021-09-15 | Discharge: 2021-09-17 | Disposition: A | Discharge status: Home Health | Payer: PPO | Attending: Internal Medicine | Admitting: Internal Medicine

## 2021-09-15 ENCOUNTER — Emergency Department (HOSPITAL_COMMUNITY): Payer: PPO

## 2021-09-15 DIAGNOSIS — R569 Unspecified convulsions: Secondary | ICD-10-CM

## 2021-09-15 DIAGNOSIS — Z952 Presence of prosthetic heart valve: Secondary | ICD-10-CM | POA: Insufficient documentation

## 2021-09-15 DIAGNOSIS — Z7982 Long term (current) use of aspirin: Secondary | ICD-10-CM | POA: Diagnosis not present

## 2021-09-15 DIAGNOSIS — I2510 Atherosclerotic heart disease of native coronary artery without angina pectoris: Secondary | ICD-10-CM | POA: Diagnosis not present

## 2021-09-15 DIAGNOSIS — I471 Supraventricular tachycardia: Secondary | ICD-10-CM | POA: Diagnosis present

## 2021-09-15 DIAGNOSIS — M6281 Muscle weakness (generalized): Secondary | ICD-10-CM | POA: Insufficient documentation

## 2021-09-15 DIAGNOSIS — R778 Other specified abnormalities of plasma proteins: Secondary | ICD-10-CM | POA: Insufficient documentation

## 2021-09-15 DIAGNOSIS — Z79899 Other long term (current) drug therapy: Secondary | ICD-10-CM | POA: Insufficient documentation

## 2021-09-15 DIAGNOSIS — N1832 Chronic kidney disease, stage 3b: Secondary | ICD-10-CM | POA: Diagnosis not present

## 2021-09-15 DIAGNOSIS — R072 Precordial pain: Secondary | ICD-10-CM | POA: Diagnosis present

## 2021-09-15 DIAGNOSIS — E039 Hypothyroidism, unspecified: Secondary | ICD-10-CM | POA: Diagnosis not present

## 2021-09-15 DIAGNOSIS — Z86718 Personal history of other venous thrombosis and embolism: Secondary | ICD-10-CM | POA: Insufficient documentation

## 2021-09-15 DIAGNOSIS — R079 Chest pain, unspecified: Secondary | ICD-10-CM | POA: Diagnosis not present

## 2021-09-15 DIAGNOSIS — Z87891 Personal history of nicotine dependence: Secondary | ICD-10-CM | POA: Insufficient documentation

## 2021-09-15 DIAGNOSIS — I214 Non-ST elevation (NSTEMI) myocardial infarction: Secondary | ICD-10-CM | POA: Diagnosis not present

## 2021-09-15 DIAGNOSIS — R2681 Unsteadiness on feet: Secondary | ICD-10-CM | POA: Diagnosis not present

## 2021-09-15 DIAGNOSIS — I350 Nonrheumatic aortic (valve) stenosis: Secondary | ICD-10-CM | POA: Insufficient documentation

## 2021-09-15 DIAGNOSIS — R7989 Other specified abnormal findings of blood chemistry: Secondary | ICD-10-CM

## 2021-09-15 DIAGNOSIS — I129 Hypertensive chronic kidney disease with stage 1 through stage 4 chronic kidney disease, or unspecified chronic kidney disease: Secondary | ICD-10-CM | POA: Diagnosis not present

## 2021-09-15 DIAGNOSIS — K219 Gastro-esophageal reflux disease without esophagitis: Secondary | ICD-10-CM | POA: Diagnosis present

## 2021-09-15 DIAGNOSIS — E785 Hyperlipidemia, unspecified: Secondary | ICD-10-CM | POA: Diagnosis present

## 2021-09-15 DIAGNOSIS — I10 Essential (primary) hypertension: Secondary | ICD-10-CM | POA: Diagnosis present

## 2021-09-15 LAB — BASIC METABOLIC PANEL
Anion gap: 9 (ref 5–15)
BUN: 15 mg/dL (ref 8–23)
CO2: 21 mmol/L — ABNORMAL LOW (ref 22–32)
Calcium: 9.6 mg/dL (ref 8.9–10.3)
Chloride: 110 mmol/L (ref 98–111)
Creatinine, Ser: 1.12 mg/dL — ABNORMAL HIGH (ref 0.44–1.00)
GFR, Estimated: 51 mL/min — ABNORMAL LOW (ref >60)
Glucose, Bld: 103 mg/dL — ABNORMAL HIGH (ref 70–99)
Potassium: 4.3 mmol/L (ref 3.5–5.1)
Sodium: 140 mmol/L (ref 135–145)

## 2021-09-15 LAB — HEPARIN LEVEL (UNFRACTIONATED): Heparin Unfractionated: 0.39 IU/mL (ref 0.30–0.70)

## 2021-09-15 LAB — CBC
HCT: 43.4 % (ref 36.0–46.0)
Hemoglobin: 13.5 g/dL (ref 12.0–15.0)
MCH: 28.1 pg (ref 26.0–34.0)
MCHC: 31.1 g/dL (ref 30.0–36.0)
MCV: 90.2 fL (ref 80.0–100.0)
Platelets: 285 10*3/uL (ref 150–400)
RBC: 4.81 MIL/uL (ref 3.87–5.11)
RDW: 13.1 % (ref 11.5–15.5)
WBC: 8.4 10*3/uL (ref 4.0–10.5)
nRBC: 0 % (ref 0.0–0.2)

## 2021-09-15 LAB — TROPONIN I (HIGH SENSITIVITY)
Troponin I (High Sensitivity): 450 ng/L (ref <18)
Troponin I (High Sensitivity): 424 ng/L (ref <18)
Troponin I (High Sensitivity): 469 ng/L (ref <18)

## 2021-09-15 MED ORDER — ACETAMINOPHEN 325 MG PO TABS
650.0000 mg | ORAL_TABLET | ORAL | Status: DC | PRN
  Administered 2021-09-16 (×2): 650 mg via ORAL
  Filled 2021-09-15 (×2): qty 2

## 2021-09-15 MED ORDER — LEVOTHYROXINE SODIUM 100 MCG PO TABS
100.0000 ug | ORAL_TABLET | Freq: Every morning | ORAL | Status: DC
  Administered 2021-09-16 – 2021-09-17 (×2): 100 ug via ORAL
  Filled 2021-09-15 (×2): qty 1

## 2021-09-15 MED ORDER — NEBIVOLOL HCL 2.5 MG PO TABS
2.5000 mg | ORAL_TABLET | Freq: Two times a day (BID) | ORAL | Status: DC
  Administered 2021-09-15 – 2021-09-17 (×3): 2.5 mg via ORAL
  Filled 2021-09-15 (×5): qty 1

## 2021-09-15 MED ORDER — ROSUVASTATIN CALCIUM 20 MG PO TABS
40.0000 mg | ORAL_TABLET | Freq: Every day | ORAL | Status: DC
  Administered 2021-09-16 – 2021-09-17 (×2): 40 mg via ORAL
  Filled 2021-09-15 (×2): qty 2

## 2021-09-15 MED ORDER — ASPIRIN 81 MG PO TBEC
81.0000 mg | DELAYED_RELEASE_TABLET | Freq: Every day | ORAL | Status: DC
  Administered 2021-09-16 – 2021-09-17 (×2): 81 mg via ORAL
  Filled 2021-09-15 (×2): qty 1

## 2021-09-15 MED ORDER — DULOXETINE HCL 60 MG PO CPEP
60.0000 mg | ORAL_CAPSULE | Freq: Every day | ORAL | Status: DC
  Administered 2021-09-16 – 2021-09-17 (×2): 60 mg via ORAL
  Filled 2021-09-15: qty 1
  Filled 2021-09-15: qty 2

## 2021-09-15 MED ORDER — DILTIAZEM HCL ER COATED BEADS 180 MG PO CP24
180.0000 mg | ORAL_CAPSULE | Freq: Every day | ORAL | Status: DC
  Administered 2021-09-16 – 2021-09-17 (×2): 180 mg via ORAL
  Filled 2021-09-15 (×4): qty 1

## 2021-09-15 MED ORDER — CALCITRIOL 0.25 MCG PO CAPS
0.2500 ug | ORAL_CAPSULE | Freq: Every day | ORAL | Status: DC
  Administered 2021-09-16 – 2021-09-17 (×2): 0.25 ug via ORAL
  Filled 2021-09-15 (×3): qty 1

## 2021-09-15 MED ORDER — ALPRAZOLAM 0.25 MG PO TABS
0.2500 mg | ORAL_TABLET | Freq: Two times a day (BID) | ORAL | Status: DC
  Administered 2021-09-15 – 2021-09-17 (×4): 0.25 mg via ORAL
  Filled 2021-09-15 (×4): qty 1

## 2021-09-15 MED ORDER — LEVETIRACETAM 500 MG PO TABS
500.0000 mg | ORAL_TABLET | Freq: Two times a day (BID) | ORAL | Status: DC
  Administered 2021-09-15 – 2021-09-17 (×4): 500 mg via ORAL
  Filled 2021-09-15 (×4): qty 1

## 2021-09-15 MED ORDER — HEPARIN BOLUS VIA INFUSION
3000.0000 [IU] | Freq: Once | INTRAVENOUS | Status: AC
  Administered 2021-09-15: 3000 [IU] via INTRAVENOUS
  Filled 2021-09-15: qty 3000

## 2021-09-15 MED ORDER — HEPARIN (PORCINE) 25000 UT/250ML-% IV SOLN
650.0000 [IU]/h | INTRAVENOUS | Status: DC
  Administered 2021-09-15: 650 [IU]/h via INTRAVENOUS
  Filled 2021-09-15: qty 250

## 2021-09-15 MED ORDER — ONDANSETRON HCL 4 MG/2ML IJ SOLN: 4.0000 mg | Freq: Four times a day (QID) | INTRAMUSCULAR | Status: DC | PRN

## 2021-09-15 MED ORDER — PANTOPRAZOLE SODIUM 40 MG PO TBEC
40.0000 mg | DELAYED_RELEASE_TABLET | Freq: Two times a day (BID) | ORAL | Status: DC
  Administered 2021-09-15 – 2021-09-17 (×3): 40 mg via ORAL
  Filled 2021-09-15 (×3): qty 1

## 2021-09-15 NOTE — ED Triage Notes (Signed)
Pt presents with c/o chest pain that started today. Pt reports that she was here on Sunday after a fall where she hit her head. Pt had elevated enzymes while she was here and the MD recommended cardiology consult and the pt left AMA before consult could be done. Pt called cardiology today and they told her to come back to the ER.

## 2021-09-15 NOTE — ED Provider Notes (Signed)
[facility] DEPT Provider Note   CSN: [id number] Arrival date & time: 09/15/21  3428     History  Chief Complaint  Patient presents with   Chest Pain    [name] is a 76 y.o. female.  Patient states that she has been having chest pain off and on today.  The pain lasted about 15 minutes.  Patient has a history of coronary artery disease with a stent placed.  The history is provided by the patient and medical records.  Chest Pain Pain location:  L chest Pain radiates to:  Does not radiate Onset quality:  Sudden Timing:  Constant Progression:  Worsening Chronicity:  Recurrent Context: not breathing   Associated symptoms: no abdominal pain, no back pain, no cough, no fatigue and no headache        Home Medications Prior to Admission medications   Medication Sig Start Date End Date Taking? Authorizing Provider  ALPRAZolam (XANAX) 0.25 MG tablet Take 0.25 mg by mouth 2 (two) times daily.    Yes [provider]  aspirin EC 81 MG tablet Take 81 mg by mouth daily.   Yes [provider]  calcitRIOL (ROCALTROL) 0.25 MCG capsule Take 0.25 mcg by mouth daily.   Yes [provider]  [employer] D3 50 MCG (2000 UT) CAPS Take 2,000 Units by mouth daily. 02/11/18  Yes [provider]  diltiazem (CARDIZEM LA) 180 MG 24 hr tablet Take 180 mg by mouth daily.   Yes [provider]  DULoxetine (CYMBALTA) 60 MG capsule Take 60 mg by mouth daily.   Yes [provider]  levETIRAcetam (KEPPRA) 500 MG tablet TAKE 1 TABLET BY MOUTH TWICE A DAY Patient taking differently: Take 500 mg by mouth 2 (two) times daily. 12/24/20  Yes Suzzanne Cloud, NP  levothyroxine (SYNTHROID) 100 MCG tablet Take 100 mcg by mouth every morning. 09/09/21  Yes [provider]  nebivolol (BYSTOLIC) 5 MG tablet Take 2.5 mg by mouth in the morning and at bedtime.   Yes [provider]  pantoprazole (PROTONIX) 40 MG tablet Take 1  tablet (40 mg total) by mouth 2 (two) times daily. 03/11/21  Yes Irene Shipper, MD  nitroGLYCERIN (NITROSTAT) 0.4 MG SL tablet PLACE 1 TABLET (0.4 MG TOTAL) UNDER THE TONGUE EVERY 5 (FIVE) MINUTES AS NEEDED FOR CHEST PAIN. Patient taking differently: Place 0.4 mg under the tongue every 5 (five) minutes as needed for chest pain. 07/17/20   Sueanne Margarita, MD  potassium chloride SA (K-DUR,KLOR-CON) 20 MEQ tablet Take 20 mEq by mouth 2 (two) times daily.  09/02/17   [provider]  rosuvastatin (CRESTOR) 40 MG tablet Take 1 tablet (40 mg total) by mouth daily. 01/29/21 09/13/21  Sueanne Margarita, MD      Allergies    Patient has no known allergies.    Review of Systems   Review of Systems  Constitutional:  Negative for appetite change and fatigue.  HENT:  Negative for congestion, ear discharge and sinus pressure.   Eyes:  Negative for discharge.  Respiratory:  Negative for cough.   Cardiovascular:  Positive for chest pain.  Gastrointestinal:  Negative for abdominal pain and diarrhea.  Genitourinary:  Negative for frequency and hematuria.  Musculoskeletal:  Negative for back pain.  Skin:  Negative for rash.  Neurological:  Negative for seizures and headaches.  Psychiatric/Behavioral:  Negative for hallucinations.     Physical Exam Updated Vital Signs BP 134/73   Pulse  62   Temp 97.6 F (36.4 C)   Resp 13   Ht '4\' 11"'$  (1.499 m)   Wt 54.4 kg   SpO2 99%   BMI 24.24 kg/m  Physical Exam Vitals and nursing note reviewed.  Constitutional:      Appearance: She is well-developed.  HENT:     Head: Normocephalic.     Nose: Nose normal.  Eyes:     General: No scleral icterus.    Conjunctiva/sclera: Conjunctivae normal.  Neck:     Thyroid: No thyromegaly.  Cardiovascular:     Rate and Rhythm: Normal rate and regular rhythm.     Heart sounds: No murmur heard.    No friction rub. No gallop.  Pulmonary:     Breath sounds: No stridor. No wheezing or rales.  Chest:     Chest wall:  No tenderness.  Abdominal:     General: There is no distension.     Tenderness: There is no abdominal tenderness. There is no rebound.  Musculoskeletal:        General: Normal range of motion.     Cervical back: Neck supple.  Lymphadenopathy:     Cervical: No cervical adenopathy.  Skin:    Findings: No erythema or rash.  Neurological:     Mental Status: She is alert and oriented to person, place, and time.     Motor: No abnormal muscle tone.     Coordination: Coordination normal.  Psychiatric:        Behavior: Behavior normal.     ED Results / Procedures / Treatments   Labs (all labs ordered are listed, but only abnormal results are displayed) Labs Reviewed  BASIC METABOLIC PANEL - Abnormal; Notable for the following components:      Result Value   CO2 21 (*)    Glucose, Bld 103 (*)    Creatinine, Ser 1.12 (*)    GFR, Estimated 51 (*)    All other components within normal limits  TROPONIN I (HIGH SENSITIVITY) - Abnormal; Notable for the following components:   Troponin I (High Sensitivity) 450 (*)    All other components within normal limits  CBC  HEPARIN LEVEL (UNFRACTIONATED)  TROPONIN I (HIGH SENSITIVITY)    EKG EKG Interpretation  Date/Time:  Tuesday September 15 2021 09:53:07 EDT Ventricular Rate:  59 PR Interval:  189 QRS Duration: 92 QT Interval:  420 QTC Calculation: 416 R Axis:   -48 Text Interpretation: Sinus rhythm Inferior infarct, old Anterior infarct, old Confirmed by Milton Ferguson [phone]) on 09/15/2021 10:49:37 AM  Radiology DG Chest 2 View  Result Date: 09/15/2021 CLINICAL DATA:  Chest pain beginning today, fell and struck head on Sunday EXAM: CHEST - 2 VIEW COMPARISON:  08/18/2017 FINDINGS: Normal heart size post median sternotomy. Mediastinal contours and pulmonary vascularity normal. Atherosclerotic calcification aorta. Chronic and elevation of RIGHT diaphragm and minimal lingular scarring. No acute infiltrate, pleural effusion, or pneumothorax.  Bones demineralized. IMPRESSION: Chronic elevation of RIGHT diaphragm and minimal lingular scarring. No acute abnormalities. Aortic Atherosclerosis (ICD10-I70.0). Electronically Signed   By: Lavonia Dana M.D.   On: 09/15/2021 11:05    Procedures Procedures    Medications Ordered in ED Medications  heparin bolus via infusion 3,000 Units (has no administration in time range)  heparin ADULT infusion 100 units/mL (25000 units/251m) (has no administration in time range)    ED Course/ Medical Decision Making/ A&P  CRITICAL CARE Performed by: JMilton FergusonTotal critical care time: 40 minutes Critical care time was  exclusive of separately billable procedures and treating other patients. Critical care was necessary to treat or prevent imminent or life-threatening deterioration. Critical care was time spent personally by me on the following activities: development of treatment plan with patient and/or surrogate as well as nursing, discussions with consultants, evaluation of patient's response to treatment, examination of patient, obtaining history from patient or surrogate, ordering and performing treatments and interventions, ordering and review of laboratory studies, ordering and review of radiographic studies, pulse oximetry and re-evaluation of patient's condition.                          Medical Decision Making Amount and/or Complexity of Data Reviewed Labs: ordered. Radiology: ordered.  Risk Prescription drug management. Decision regarding hospitalization.  This patient presents to the ED for concern of chest pain, this involves an extensive number of treatment options, and is a complaint that carries with it a high risk of complications and morbidity.  The differential diagnosis includes MI PE   Co morbidities that complicate the patient evaluation  Coronary artery disease   Additional history obtained:  Additional history obtained from patient External records from outside  source obtained and reviewed including hospital records   Lab Tests:  I Ordered, and personally interpreted labs.  The pertinent results include: Troponin elevated at 318   Imaging Studies ordered:  I ordered imaging studies including chest x-ray I independently visualized and interpreted imaging which showed no acute disease I agree with the radiologist interpretation   Cardiac Monitoring: / EKG:  The patient was maintained on a cardiac monitor.  I personally viewed and interpreted the cardiac monitored which showed an underlying rhythm of: Normal sinus rhythm   Consultations Obtained:  I requested consultation with the cardiology,  and discussed lab and imaging findings as well as pertinent plan - they recommend: Start heparin and admit   Problem List / ED Course / Critical interventions / Medication management  Coronary artery disease and NSTEMI I ordered medication including heparin for the NSTEMI Reevaluation of the patient after these medicines showed that the patient stayed the same I have reviewed the patients home medicines and have made adjustments as needed   Social Determinants of Health:  None   Test / Admission - Considered:  Patient will be admitted to hospital  Patient has an NSTEMI.  Troponin is 450.  I spoke to cardiology and we will place the patient on heparin and she will be admitted to medicine [facility] at [facility] and cardiology will consult        Final Clinical Impression(s) / ED Diagnoses Final diagnoses:  NSTEMI (non-ST elevated myocardial infarction) [facility])    Rx / [place] Orders ED Discharge Orders     None         Milton Ferguson, MD 09/18/21 [phone]

## 2021-09-15 NOTE — Progress Notes (Signed)
ANTICOAGULATION CONSULT NOTE - Initial Consult  Pharmacy Consult for Heparin Indication: chest pain/ACS  No Known Allergies  Patient Measurements: Height: '4\' 11"'$  (149.9 cm) Weight: 54.4 kg (120 lb) IBW/kg (Calculated) : 43.2 Heparin Dosing Weight: 54 kg  Vital Signs: Temp: 97.6 F (36.4 C) (09/05 0953) Temp Source: Oral (09/05 0953) BP: 139/76 (09/05 1300) Pulse Rate: 59 (09/05 1300)  Labs: Recent Labs    09/13/21 1009 09/13/21 1103 09/13/21 1249 09/15/21 1126  HGB 12.9  --   --  13.5  HCT 40.6  --   --  43.4  PLT 269  --   --  285  CREATININE 1.39*  --   --  1.12*  TROPONINIHS  --  19* 57* 450*    Estimated Creatinine Clearance: 32.7 mL/min (A) (by C-G formula based on SCr of 1.12 mg/dL (H)).   Medical History: Past Medical History:  Diagnosis Date   Anxiety    Arthritis    Blood transfusion without reported diagnosis    childhood   Carpal tunnel syndrome, bilateral 07/31/2015   Cataract    Cervical spondylosis without myelopathy    Chronic chest pain    Chronic kidney disease    Chronic low back pain    Chronotropic incompetence    CKD (chronic kidney disease), stage III (HCC)    Coronary artery disease    a. inf-post MI 2011 s/p DES to RCA. b. DES to RCA 06/2010. c. DES to LAD 2014, residual dz treated medically.   Depression    DVT (deep venous thrombosis) (HCC)    left   Esophageal stricture    GERD (gastroesophageal reflux disease)    Gross hematuria    Heart murmur    History of esophageal dilatation    FOR STRIUCTURE   History of gout    History of hiatal hernia    History of kidney stones    Hyperlipidemia    Hypertension    Hypothyroidism    Internal hemorrhoids    Irritable bowel syndrome    Mild aortic stenosis    by echo 2018   Myocardial infarction ([place])    Obesity    Orthostatic hypotension    Partial seizure disorder (HCC) NEUROLOGIST-- DR Jannifer Franklin   NOCTURNAL   PONV (postoperative nausea and vomiting)    hard time getting iv  site-had to do neck stick 2 yrs ago   Pulmonary fibrosis (HCC)    S/P pericardial cyst excision    02-05-2013  benign   Seizures ([place])    none in yrs   SVT (supraventricular tachycardia) (HCC)    Trigger finger, acquired 09/30/2015   Right middle finger   Vitamin D deficiency    Assessment: Active Problem(s): Fall with head lacteration 9/3>>back to ED with new CP 9/5 Pt was here on Sunday after a dizzy spell>> fall hitting head. Pt had elevated enzymes (19, 57)  while she was here and the MD recommended cardiology consult and the pt left AMA before consult could be done  AC/Heme: CP with elevated troponins and known h/o CAD (fall hitting head with laceration 9/3, CT negative) Baseline CBC WNL. Start IV heparin    Goal of Therapy:  Heparin level 0.3-0.7 units/ml Monitor platelets by anticoagulation protocol: Yes   Plan:  Heparin 3000 unit bolus Heparin infusion at 650 units/hr Check heparin level in 8 hrs. Daily HL and CBC   Declan Mier S. Alford Highland, PharmD, BCPS Clinical Staff Pharmacist [email or web address]  Alford Highland, [place] 09/15/2021,1:54 PM

## 2021-09-15 NOTE — Progress Notes (Signed)
ANTICOAGULATION CONSULT NOTE - follow up  Pharmacy Consult for Heparin Indication: chest pain/ACS  No Known Allergies  Patient Measurements: Height: '4\' 11"'$  (149.9 cm) Weight: 54.4 kg (120 lb) IBW/kg (Calculated) : 43.2 Heparin Dosing Weight: 54 kg  Vital Signs: Temp: 97.6 F (36.4 C) (09/05 2334) BP: 152/74 (09/05 2152) Pulse Rate: 73 (09/05 2152)  Labs: Recent Labs    09/13/21 1009 09/13/21 1103 09/15/21 1126 09/15/21 1449 09/15/21 2247  HGB 12.9  --  13.5  --   --   HCT 40.6  --  43.4  --   --   PLT 269  --  285  --   --   HEPARINUNFRC  --   --   --   --  0.39  CREATININE 1.39*  --  1.12*  --   --   TROPONINIHS  --    < > 450* 424* 469*   < > = values in this interval not displayed.     Estimated Creatinine Clearance: 32.7 mL/min (A) (by C-G formula based on SCr of 1.12 mg/dL (H)).   Medical History: Past Medical History:  Diagnosis Date   Anxiety    Arthritis    Blood transfusion without reported diagnosis    childhood   Carpal tunnel syndrome, bilateral 07/31/2015   Cataract    Cervical spondylosis without myelopathy    Chronic chest pain    Chronic kidney disease    Chronic low back pain    Chronotropic incompetence    CKD (chronic kidney disease), stage III (HCC)    Coronary artery disease    a. inf-post MI 2011 s/p DES to RCA. b. DES to RCA 06/2010. c. DES to LAD 2014, residual dz treated medically.   Depression    DVT (deep venous thrombosis) (HCC)    left   Esophageal stricture    GERD (gastroesophageal reflux disease)    Gross hematuria    Heart murmur    History of esophageal dilatation    FOR STRIUCTURE   History of gout    History of hiatal hernia    History of kidney stones    Hyperlipidemia    Hypertension    Hypothyroidism    Internal hemorrhoids    Irritable bowel syndrome    Mild aortic stenosis    by echo 2018   Myocardial infarction ([place])    Obesity    Orthostatic hypotension    Partial seizure disorder (HCC)  NEUROLOGIST-- DR Jannifer Franklin   NOCTURNAL   PONV (postoperative nausea and vomiting)    hard time getting iv site-had to do neck stick 2 yrs ago   Pulmonary fibrosis (HCC)    S/P pericardial cyst excision    02-05-2013  benign   Seizures ([place])    none in yrs   SVT (supraventricular tachycardia) (HCC)    Trigger finger, acquired 09/30/2015   Right middle finger   Vitamin D deficiency    Assessment: Active Problem(s): Fall with head lacteration 9/3>>back to ED with new CP 9/5 Pt was here on Sunday after a dizzy spell>> fall hitting head. Pt had elevated enzymes (19, 57)  while she was here and the MD recommended cardiology consult and the pt left AMA before consult could be done  AC/Heme: CP with elevated troponins and known h/o CAD (fall hitting head with laceration 9/3, CT negative) Baseline CBC WNL. Start IV heparin   1st HL 0.39 therapeutic on 650 units/hr Trop 469 No bleeding noted  Goal of Therapy:  Heparin  level 0.3-0.7 units/ml Monitor platelets by anticoagulation protocol: Yes   Plan:  Continue Heparin infusion at 650 units/hr Check heparin level in 8 hrs. Daily HL and CBC   Dolly Rias RPh 09/15/2021, 11:47 PM

## 2021-09-15 NOTE — H&P (Signed)
History and Physical    Patient: [name] UKG:[id number] DOB: 04-15-1945 DOA: 09/15/2021 DOS: the patient was seen and examined on 09/15/2021 PCP: Carol Ada, MD  Patient coming from: Home  Chief Complaint:  Chief Complaint  Patient presents with   Chest Pain   HPI: [name] is a 76 y.o. female with medical history significant of HLD, hypothyroidism, anxiety/depression, GERD, HTN, epilepsy, SVT, CKD3a. Presenting with chest pain. She reports that 2 days ago she had a fall at home. She became dizzy after walking her dog. She lost her balance, fell, and hit her forehead. This resulted in a laceration of the right forehead. She denies LOC. She denies CP or palpitations prior to the fall. She came to the ED and had her laceration repaired. During that visit, it was noted that she had a mildly elevated troponin. It was recommended that she stay for further evaluation, but she left AMA. She reports since being home, she had intermittent episodes of midsternal chest pain. It's non-radiating. It's sharp. It lasts for a minute or two at a time. She called her cardiologist this morning when her symptoms did not improve and they recommended that she come to the ED for evaluation. She denies any other aggravating or alleviating factors.    Review of Systems: As mentioned in the history of present illness. All other systems reviewed and are negative. Past Medical History:  Diagnosis Date   Anxiety    Arthritis    Blood transfusion without reported diagnosis    childhood   Carpal tunnel syndrome, bilateral 07/31/2015   Cataract    Cervical spondylosis without myelopathy    Chronic chest pain    Chronic kidney disease    Chronic low back pain    Chronotropic incompetence    CKD (chronic kidney disease), stage III (HCC)    Coronary artery disease    a. inf-post MI 2011 s/p DES to RCA. b. DES to RCA 06/2010. c. DES to LAD 2014, residual dz treated medically.   Depression    DVT (deep  venous thrombosis) (HCC)    left   Esophageal stricture    GERD (gastroesophageal reflux disease)    Gross hematuria    Heart murmur    History of esophageal dilatation    FOR STRIUCTURE   History of gout    History of hiatal hernia    History of kidney stones    Hyperlipidemia    Hypertension    Hypothyroidism    Internal hemorrhoids    Irritable bowel syndrome    Mild aortic stenosis    by echo 2018   Myocardial infarction ([place])    Obesity    Orthostatic hypotension    Partial seizure disorder (HCC) NEUROLOGIST-- DR Jannifer Franklin   NOCTURNAL   PONV (postoperative nausea and vomiting)    hard time getting iv site-had to do neck stick 2 yrs ago   Pulmonary fibrosis (HCC)    S/P pericardial cyst excision    02-05-2013  benign   Seizures ([place])    none in yrs   SVT (supraventricular tachycardia) (HCC)    Trigger finger, acquired 09/30/2015   Right middle finger   Vitamin D deficiency    Past Surgical History:  Procedure Laterality Date   BIOPSY OF MEDIASTINAL MASS N/A 02/05/2013   Procedure: RESECTION OF MEDIASTINAL MASS;  Surgeon: Ivin Poot, MD;  Location: [place];  Service: Thoracic;  Laterality: N/A;   CARDIAC CATHETERIZATION Right 01/24/2015   Procedure: right  femoral ARTERIAL LINE INSERTION;  Surgeon: Ivin Poot, MD;  Location: [place];  Service: Thoracic;  Laterality: Right;   CARDIOVASCULAR STRESS TEST  11-22-2013  dr Irish Lack   normal lexiscan study/  no ischemia/  normal LVF and wall motion/ ef 81%   COLONOSCOPY  last one 2014   CORONARY ANGIOGRAM  03/10/2011   Procedure: CORONARY ANGIOGRAM;  Surgeon: Jettie Booze, MD;  Location: [facility] CATH LAB;  Service: Cardiovascular;;   CORONARY ANGIOPLASTY WITH STENT PLACEMENT  11-22-2009  dr Irish Lack   Acute inferoposterior MI/  ef 60%,  PCI with DES x1 to dRCA,  25%  mLAD   CORONARY ANGIOPLASTY WITH STENT PLACEMENT  06-26-2010  dr Daneen Schick   Cutting balloon angioplasty to dRCA with DES x1,  40% mLAD (non-obstructive  CAD)   CYSTOSCOPY WITH RETROGRADE PYELOGRAM, URETEROSCOPY AND STENT PLACEMENT Right 02/13/2014   Procedure: CYSTOSCOPY WITH RETROGRADE PYELOGRAM, URETEROSCOPY AND STENT PLACEMENT;  Surgeon: Bernestine Amass, MD;  Location: [facility];  Service: Urology;  Laterality: Right;   ECTOPIC PREGNANCY SURGERY  YRS AGO   SALPINGECTOMY   ESOPHAGOGASTRODUODENOSCOPY N/A 02/15/2014   Procedure: ESOPHAGOGASTRODUODENOSCOPY (EGD);  Surgeon: Jerene Bears, MD;  Location: [facility];  Service: Endoscopy;  Laterality: N/A;   ESOPHAGOGASTRODUODENOSCOPY (EGD) WITH ESOPHAGEAL DILATION  05-20-2011   HOLMIUM LASER APPLICATION Right 1/0/2725   Procedure: HOLMIUM LASER APPLICATION;  Surgeon: Bernestine Amass, MD;  Location: [facility];  Service: Urology;  Laterality: Right;   LEFT HEART CATH AND CORONARY ANGIOGRAPHY N/A 09/12/2019   Procedure: LEFT HEART CATH AND CORONARY ANGIOGRAPHY;  Surgeon: Jettie Booze, MD;  Location: [place] CV LAB;  Service: Cardiovascular;  Laterality: N/A;   LEFT HEART CATHETERIZATION WITH CORONARY ANGIOGRAM N/A 03/14/2012   Procedure: LEFT HEART CATHETERIZATION WITH CORONARY ANGIOGRAM;  Surgeon: Sueanne Margarita, MD;  Location: [place] CATH LAB;  Service: Cardiovascular;  Laterality: N/A;  Normal LM,  50% pLAD,  70% mLAD,  D2 50-70%, very tortuous LAD,  70-82% ostial LCFX,  50% in-stent restenosis of  dRCA and mRCA  stent and 50-70% ostial PDA,  normal LVSF, ef 60%   LUNG BIOPSY Right 01/24/2015   Procedure: RIGHT LUNG BIOPSY;  Surgeon: Ivin Poot, MD;  Location: [place];  Service: Thoracic;  Laterality: Right;   MEDIASTERNOTOMY N/A 02/05/2013   Procedure: MEDIAN STERNOTOMY;  Surgeon: Ivin Poot, MD;  Location: [place];  Service: Thoracic;  Laterality: N/A;   MEDIASTERNOTOMY Left 02/05/2013   NEEDLE GUIDED EXCISION BREAST CALCIFICATIONS Right 08-09-2008   PERCUTANEOUS CORONARY STENT INTERVENTION (PCI-S) N/A 04/03/2012   Procedure: PERCUTANEOUS CORONARY STENT  INTERVENTION (PCI-S);  Surgeon: Jettie Booze, MD;  Location: [facility] CATH LAB;  Service: Cardiovascular;  Laterality: N/A;   Successful PCI  mLAD with 2.75x12 Promus stent, postdilated to >0.68m   THORACOTOMY Right 01/24/2015   Procedure: RIGHT MINI/LIMITED THORACOTOMY;  Surgeon: PIvin Poot MD;  [place]  Service: Thoracic;  Laterality: Right;   TRANSTHORACIC ECHOCARDIOGRAM  11-17-2011   grade I diastolic dysfunction/  ef 55-60%   VIDEO BRONCHOSCOPY N/A 01/24/2015   Procedure: RIGHT VIDEO BRONCHOSCOPY;  Surgeon: PIvin Poot MD;  [place]  Service: Thoracic;  Laterality: N/A;   Social History:  reports that she quit smoking about 53 years ago. Her smoking use included cigarettes. She started smoking about 56 years ago. She has a 0.90 pack-year smoking history. She has never used smokeless tobacco. She reports that she does not drink  alcohol and does not use drugs.  No Known Allergies  Family History  Problem Relation Age of Onset   Heart attack Mother    CVA Mother    Heart disease Father    Hypertension Father    Emphysema Father    Coronary artery disease Father    Diabetes Mellitus I Father    Breast cancer Sister    Breast cancer Niece        niece   Colon cancer Neg Hx    Stomach cancer Neg Hx    Esophageal cancer Neg Hx    Rectal cancer Neg Hx    Seizures Neg Hx     Prior to Admission medications   Medication Sig Start Date End Date Taking? Authorizing Provider  ALPRAZolam (XANAX) 0.25 MG tablet Take 0.25 mg by mouth 2 (two) times daily.    Yes [provider]  aspirin EC 81 MG tablet Take 81 mg by mouth daily.   Yes [provider]  calcitRIOL (ROCALTROL) 0.25 MCG capsule Take 0.25 mcg by mouth daily.   Yes [provider]  CVS D3 50 MCG (2000 UT) CAPS Take 2,000 Units by mouth daily. 02/11/18  Yes [provider]  diltiazem (CARDIZEM LA) 180 MG 24 hr tablet Take 180 mg by mouth daily.   Yes [provider]  DULoxetine (CYMBALTA) 60 MG capsule Take 60 mg by mouth daily.   Yes [provider]  ferrous sulfate 325 (65 FE) MG tablet Take 325 mg by mouth daily with breakfast.   Yes [provider]  levETIRAcetam (KEPPRA) 500 MG tablet TAKE 1 TABLET BY MOUTH TWICE A DAY Patient taking differently: Take 500 mg by mouth 2 (two) times daily. 12/24/20  Yes Suzzanne Cloud, NP  levothyroxine (SYNTHROID) 100 MCG tablet Take 100 mcg by mouth every morning. 09/09/21  Yes [provider]  magnesium oxide (MAG-OX) 400 (240 Mg) MG tablet Take 400 mg by mouth 2 (two) times daily.   Yes [provider]  nebivolol (BYSTOLIC) 5 MG tablet Take 2.5 mg by mouth in the morning and at bedtime.   Yes [provider]  pantoprazole (PROTONIX) 40 MG tablet Take 1 tablet (40 mg total) by mouth 2 (two) times daily. 03/11/21  Yes Irene Shipper, MD  potassium chloride SA (K-DUR,KLOR-CON) 20 MEQ tablet Take 20 mEq by mouth 2 (two) times daily.  09/02/17  Yes [provider]  rosuvastatin (CRESTOR) 40 MG tablet Take 1 tablet (40 mg total) by mouth daily. 01/29/21 11/21/21 Yes Turner, Eber Hong, MD  nitroGLYCERIN (NITROSTAT) 0.4 MG SL tablet PLACE 1 TABLET (0.4 MG TOTAL) UNDER THE TONGUE EVERY 5 (FIVE) MINUTES AS NEEDED FOR CHEST PAIN. Patient taking differently: Place 0.4 mg under the tongue every 5 (five) minutes as needed for chest pain. 07/17/20   Sueanne Margarita, MD    Physical Exam: Vitals:   09/15/21 1330 09/15/21 1345 09/15/21 1355 09/15/21 1400  BP: 134/73   (!) 114/92  Pulse: (!) 57 62  (!) 58  Resp: '13 13  12  '$ Temp:   97.6 F (36.4 C)   TempSrc:      SpO2: 99% 99%  97%  Weight:      Height:       General: 76 y.o. female resting in bed in NAD Head: laceration of the right forehead extending into the scalp s/p repair w/ sutures Eyes: PERRL, normal sclera ENMT: Nares patent w/o discharge, orophaynx clear, dentition normal, ears  w/o discharge/lesions/ulcers Neck:  Supple, trachea midline Cardiovascular: RRR, +S1, S2, no m/g/r, equal pulses throughout Respiratory: CTABL, no w/r/r, normal WOB GI: BS+, NDNT, no masses noted, no organomegaly noted MSK: No e/c/c Neuro: A&O x 3, no focal deficits Psyc: Appropriate interaction and affect, tearful, but cooperative  Data Reviewed:  Lab Results  Component Value Date   NA 140 09/15/2021   K 4.3 09/15/2021   CO2 21 (L) 09/15/2021   GLUCOSE 103 (H) 09/15/2021   BUN 15 09/15/2021   CREATININE 1.12 (H) 09/15/2021   CALCIUM 9.6 09/15/2021   GFRNONAA 51 (L) 09/15/2021   Lab Results  Component Value Date   WBC 8.4 09/15/2021   HGB 13.5 09/15/2021   HCT 43.4 09/15/2021   MCV 90.2 09/15/2021   PLT 285 09/15/2021   Trp: 450 -> 424  CXR: Chronic elevation of RIGHT diaphragm and minimal lingular scarring. No acute abnormalities. Aortic Atherosclerosis  Assessment and Plan: Chest pain Elevated troponin     - place in obs, tele @ [facility]     - cards onboard, appreciate assistance     - continue heparin gtt     - trend trp     - check echo     - check lipids  Epilepsy     - continue home regimen  Hx of SVT     - continue home regimen  HTN     - continue home regimen  HLD     - continue home regimen  GERD     - PPI  CKD3a     - at baseline, watch nephrotoxins  Hypothyroidism     - continue home regimen  Advance Care Planning:   Code Status: DNR  Consults: Cardiology  Family Communication: w/ niece at bedside  Severity of Illness: The appropriate patient status for this patient is OBSERVATION. Observation status is judged to be reasonable and necessary in order to provide the required intensity of service to ensure the patient's safety. The patient's presenting symptoms, physical exam findings, and initial radiographic and laboratory data in the context of their medical condition is felt to place them at decreased risk for further clinical deterioration. Furthermore, it is anticipated  that the patient will be medically stable for discharge from the hospital within 2 midnights of admission.   Time spent in coordination of this H&P: 40 minutes   Author: Jonnie Finner, DO 09/15/2021 2:20 PM  For on call review [email or web address].

## 2021-09-15 NOTE — ED Provider Triage Note (Signed)
Emergency Medicine Provider Triage Evaluation Note  [name] , a 76 y.o. female  was evaluated in triage.  Pt complains of dizziness and CP (intermittent). Seen here Sunday with uptrending troponins (19 and 57), patient declined further work up and was told to follow up with her cardiologist. Patient called her cardiologist today who deferred back to the ER.  Review of Systems  Positive: CP, dizziness Negative: N/v  Physical Exam  BP (!) 141/81   Pulse 63   Temp 97.6 F (36.4 C) (Oral)   Resp 15   Ht '4\' 11"'$  (1.499 m)   Wt 54.4 kg   SpO2 98%   BMI 24.24 kg/m  Gen:   Awake, no distress   Resp:  Normal effort  MSK:   Moves extremities without difficulty  Other:    Medical Decision Making  Medically screening exam initiated at 10:25 AM.  Appropriate orders placed.  [name] was informed that the remainder of the evaluation will be completed by another provider, this initial triage assessment does not replace that evaluation, and the importance of remaining in the ED until their evaluation is complete.     Tacy Learn, PA-C 09/15/21 1030

## 2021-09-16 ENCOUNTER — Observation Stay (HOSPITAL_BASED_OUTPATIENT_CLINIC_OR_DEPARTMENT_OTHER): Payer: PPO

## 2021-09-16 ENCOUNTER — Other Ambulatory Visit: Payer: Self-pay

## 2021-09-16 ENCOUNTER — Encounter (HOSPITAL_COMMUNITY)
Admission: EM | Disposition: A | Discharge status: Home Health | Payer: Self-pay | Source: Home / Self Care | Attending: Emergency Medicine

## 2021-09-16 DIAGNOSIS — Z87891 Personal history of nicotine dependence: Secondary | ICD-10-CM | POA: Diagnosis not present

## 2021-09-16 DIAGNOSIS — I340 Nonrheumatic mitral (valve) insufficiency: Secondary | ICD-10-CM | POA: Diagnosis not present

## 2021-09-16 DIAGNOSIS — Z7982 Long term (current) use of aspirin: Secondary | ICD-10-CM | POA: Diagnosis not present

## 2021-09-16 DIAGNOSIS — Z79899 Other long term (current) drug therapy: Secondary | ICD-10-CM | POA: Diagnosis not present

## 2021-09-16 DIAGNOSIS — I2510 Atherosclerotic heart disease of native coronary artery without angina pectoris: Secondary | ICD-10-CM

## 2021-09-16 DIAGNOSIS — I129 Hypertensive chronic kidney disease with stage 1 through stage 4 chronic kidney disease, or unspecified chronic kidney disease: Secondary | ICD-10-CM | POA: Diagnosis not present

## 2021-09-16 DIAGNOSIS — R0789 Other chest pain: Secondary | ICD-10-CM

## 2021-09-16 DIAGNOSIS — R072 Precordial pain: Secondary | ICD-10-CM | POA: Diagnosis present

## 2021-09-16 DIAGNOSIS — R7989 Other specified abnormal findings of blood chemistry: Secondary | ICD-10-CM

## 2021-09-16 DIAGNOSIS — E039 Hypothyroidism, unspecified: Secondary | ICD-10-CM | POA: Diagnosis not present

## 2021-09-16 DIAGNOSIS — I214 Non-ST elevation (NSTEMI) myocardial infarction: Secondary | ICD-10-CM

## 2021-09-16 DIAGNOSIS — Z952 Presence of prosthetic heart valve: Secondary | ICD-10-CM | POA: Diagnosis not present

## 2021-09-16 DIAGNOSIS — I350 Nonrheumatic aortic (valve) stenosis: Secondary | ICD-10-CM | POA: Diagnosis not present

## 2021-09-16 DIAGNOSIS — N1832 Chronic kidney disease, stage 3b: Secondary | ICD-10-CM | POA: Diagnosis not present

## 2021-09-16 DIAGNOSIS — Z86718 Personal history of other venous thrombosis and embolism: Secondary | ICD-10-CM | POA: Diagnosis not present

## 2021-09-16 DIAGNOSIS — R778 Other specified abnormalities of plasma proteins: Secondary | ICD-10-CM | POA: Diagnosis not present

## 2021-09-16 HISTORY — PX: LEFT HEART CATH AND CORONARY ANGIOGRAPHY: CATH118249

## 2021-09-16 LAB — CBC
HCT: 40.3 % (ref 36.0–46.0)
HCT: 41.3 % (ref 36.0–46.0)
Hemoglobin: 12.6 g/dL (ref 12.0–15.0)
Hemoglobin: 13.4 g/dL (ref 12.0–15.0)
MCH: 28.2 pg (ref 26.0–34.0)
MCH: 28.4 pg (ref 26.0–34.0)
MCHC: 31.3 g/dL (ref 30.0–36.0)
MCHC: 32.4 g/dL (ref 30.0–36.0)
MCV: 90.2 fL (ref 80.0–100.0)
MCV: 87.5 fL (ref 80.0–100.0)
Platelets: 294 10*3/uL (ref 150–400)
Platelets: 285 10*3/uL (ref 150–400)
RBC: 4.47 MIL/uL (ref 3.87–5.11)
RBC: 4.72 MIL/uL (ref 3.87–5.11)
RDW: 12.9 % (ref 11.5–15.5)
RDW: 12.7 % (ref 11.5–15.5)
WBC: 9 10*3/uL (ref 4.0–10.5)
WBC: 7.8 10*3/uL (ref 4.0–10.5)
nRBC: 0 % (ref 0.0–0.2)
nRBC: 0 % (ref 0.0–0.2)

## 2021-09-16 LAB — ECHOCARDIOGRAM COMPLETE
AR max vel: 1.4 cm2
AV Area VTI: 1.34 cm2
AV Area mean vel: 1.33 cm2
AV Mean grad: 7.3 mmHg
AV Peak grad: 12.9 mmHg
Ao pk vel: 1.8 m/s
Area-P 1/2: 2.87 cm2
Calc EF: 67 %
Height: 59 in
S' Lateral: 2.2 cm
Single Plane A2C EF: 64.4 %
Single Plane A4C EF: 68.4 %
Weight: 1920 oz

## 2021-09-16 LAB — TROPONIN I (HIGH SENSITIVITY)
Troponin I (High Sensitivity): 483 ng/L (ref <18)
Troponin I (High Sensitivity): 399 ng/L (ref <18)
Troponin I (High Sensitivity): 318 ng/L (ref <18)

## 2021-09-16 LAB — CREATININE, SERUM
Creatinine, Ser: 1.2 mg/dL — ABNORMAL HIGH (ref 0.44–1.00)
GFR, Estimated: 47 mL/min — ABNORMAL LOW (ref >60)

## 2021-09-16 LAB — HEPARIN LEVEL (UNFRACTIONATED): Heparin Unfractionated: 0.22 IU/mL — ABNORMAL LOW (ref 0.30–0.70)

## 2021-09-16 LAB — LIPID PANEL
Cholesterol: 138 mg/dL (ref 0–200)
HDL: 56 mg/dL (ref >40)
LDL Cholesterol: 60 mg/dL (ref 0–99)
Total CHOL/HDL Ratio: 2.5 RATIO
Triglycerides: 110 mg/dL (ref <150)
VLDL: 22 mg/dL (ref 0–40)

## 2021-09-16 SURGERY — LEFT HEART CATH AND CORONARY ANGIOGRAPHY
Anesthesia: LOCAL

## 2021-09-16 MED ORDER — SODIUM CHLORIDE 0.9 % IV SOLN
250.0000 mL | INTRAVENOUS | Status: DC | PRN
Start: 2021-09-17 — End: 2021-09-17

## 2021-09-16 MED ORDER — SODIUM CHLORIDE 0.9% FLUSH
3.0000 mL | Freq: Two times a day (BID) | INTRAVENOUS | Status: DC
  Administered 2021-09-17: 3 mL via INTRAVENOUS

## 2021-09-16 MED ORDER — LIDOCAINE HCL (PF) 1 % IJ SOLN
INTRAMUSCULAR | Status: AC
Start: 2021-09-16 — End: ?
  Filled 2021-09-16: qty 30

## 2021-09-16 MED ORDER — MIDAZOLAM HCL 2 MG/2ML IJ SOLN
INTRAMUSCULAR | Status: AC
  Filled 2021-09-16: qty 2

## 2021-09-16 MED ORDER — SODIUM CHLORIDE 0.9 % WEIGHT BASED INFUSION: 1.0000 mL/kg/h | INTRAVENOUS | Status: DC

## 2021-09-16 MED ORDER — HYDRALAZINE HCL 20 MG/ML IJ SOLN
INTRAMUSCULAR | Status: DC | PRN
  Administered 2021-09-16: 10 mg via INTRAVENOUS

## 2021-09-16 MED ORDER — FENTANYL CITRATE (PF) 100 MCG/2ML IJ SOLN
INTRAMUSCULAR | Status: DC | PRN
  Administered 2021-09-16: 25 ug via INTRAVENOUS

## 2021-09-16 MED ORDER — HEPARIN (PORCINE) IN NACL 1000-0.9 UT/500ML-% IV SOLN
INTRAVENOUS | Status: AC
Start: 2021-09-16 — End: ?
  Filled 2021-09-16: qty 1000

## 2021-09-16 MED ORDER — IOHEXOL 350 MG/ML SOLN
INTRAVENOUS | Status: DC | PRN
  Administered 2021-09-16: 30 mL

## 2021-09-16 MED ORDER — LIDOCAINE HCL (PF) 1 % IJ SOLN
INTRAMUSCULAR | Status: DC | PRN
  Administered 2021-09-16: 10 mL

## 2021-09-16 MED ORDER — HYDRALAZINE HCL 20 MG/ML IJ SOLN
INTRAMUSCULAR | Status: AC
  Filled 2021-09-16: qty 1

## 2021-09-16 MED ORDER — SODIUM CHLORIDE 0.9% FLUSH: 3.0000 mL | INTRAVENOUS | Status: DC | PRN

## 2021-09-16 MED ORDER — HYDRALAZINE HCL 20 MG/ML IJ SOLN
10.0000 mg | INTRAMUSCULAR | Status: AC | PRN
Start: 2021-09-16 — End: 2021-09-16

## 2021-09-16 MED ORDER — SODIUM CHLORIDE 0.9 % WEIGHT BASED INFUSION
1.0000 mL/kg/h | INTRAVENOUS | Status: AC
  Administered 2021-09-16: 1 mL/kg/h via INTRAVENOUS

## 2021-09-16 MED ORDER — FENTANYL CITRATE (PF) 100 MCG/2ML IJ SOLN
INTRAMUSCULAR | Status: AC
  Filled 2021-09-16: qty 2

## 2021-09-16 MED ORDER — HEPARIN SODIUM (PORCINE) 5000 UNIT/ML IJ SOLN
5000.0000 [IU] | Freq: Three times a day (TID) | INTRAMUSCULAR | Status: DC
  Administered 2021-09-17: 5000 [IU] via SUBCUTANEOUS
  Filled 2021-09-16: qty 1

## 2021-09-16 MED ORDER — MIDAZOLAM HCL 2 MG/2ML IJ SOLN
INTRAMUSCULAR | Status: DC | PRN
  Administered 2021-09-16: 1 mg via INTRAVENOUS

## 2021-09-16 MED ORDER — ASPIRIN 81 MG PO CHEW: 81.0000 mg | CHEWABLE_TABLET | ORAL | Status: DC

## 2021-09-16 MED ORDER — HEPARIN (PORCINE) IN NACL 1000-0.9 UT/500ML-% IV SOLN
INTRAVENOUS | Status: DC | PRN
  Administered 2021-09-16 (×2): 500 mL

## 2021-09-16 MED ORDER — SODIUM CHLORIDE 0.9 % WEIGHT BASED INFUSION: 3.0000 mL/kg/h | INTRAVENOUS | Status: DC

## 2021-09-16 MED ORDER — SODIUM CHLORIDE 0.9% FLUSH
3.0000 mL | Freq: Two times a day (BID) | INTRAVENOUS | Status: DC
  Administered 2021-09-16 – 2021-09-17 (×2): 3 mL via INTRAVENOUS

## 2021-09-16 MED ORDER — ORAL CARE MOUTH RINSE: 15.0000 mL | OROMUCOSAL | Status: DC | PRN

## 2021-09-16 MED ORDER — HEPARIN (PORCINE) 25000 UT/250ML-% IV SOLN
700.0000 [IU]/h | INTRAVENOUS | Status: DC
Start: 2021-09-16 — End: 2021-09-16
  Administered 2021-09-16: 700 [IU]/h via INTRAVENOUS

## 2021-09-16 SURGICAL SUPPLY — 12 items
CATH INFINITI 5FR MULTPACK ANG (CATHETERS) IMPLANT
CLOSURE MYNX CONTROL 5F (Vascular Products) IMPLANT
GLIDESHEATH SLEND SS 6F .021 (SHEATH) IMPLANT
GUIDEWIRE INQWIRE 1.5J.035X260 (WIRE) IMPLANT
INQWIRE 1.5J .035X260CM (WIRE)
KIT HEART LEFT (KITS) ×1 IMPLANT
PACK CARDIAC CATHETERIZATION (CUSTOM PROCEDURE TRAY) ×1 IMPLANT
SHEATH PINNACLE 5F 10CM (SHEATH) IMPLANT
SYR MEDRAD MARK 7 150ML (SYRINGE) ×1 IMPLANT
TRANSDUCER W/STOPCOCK (MISCELLANEOUS) ×1 IMPLANT
TUBING CIL FLEX 10 FLL-RA (TUBING) ×1 IMPLANT
WIRE EMERALD 3MM-J .035X150CM (WIRE) IMPLANT

## 2021-09-16 NOTE — H&P (View-Only) (Signed)
Cardiology Consultation   Patient ID: SUMMERS BUENDIA MRN: [id number]; DOB: 1945-06-26  Admit date: 09/15/2021 Date of Consult: 09/16/2021  PCP:  Carol Ada, [place] Providers Cardiologist:  Fransico Him, MD   {  Patient Profile:   DANELLY HASSINGER is a 76 y.o. female with a hx of coronary artery disease, chronic noncardiac chest pain, hypertension, SVT, pericardial cyst s/p resection, hyperlipidemia, seizure, mild aortic stenosis and prior history of orthostatic hypotension who is being seen 09/16/2021 for the evaluation of elevated troponin at the request of Dr. Marylyn Ishihara.  Hx of CAD: -s/p inf-post MI 2011 s/p DES to RCA.  -recurrent CP s/p DES to RCA 06/2010  -recurrent CP s/p  DES to LAD 2014 with residual dz treated medically.   -recurrent CP 08/2018 with no ischemia on nuclear stress test 09/2018 -Last cardiac cath 09/2019 showed patent stents and 40% RPDA and Diagonal>> medical management.  Recommended femoral approach for future PCI given small radial artery and torturous right subclavian.   Non cardiac chest pain:  she has a long hx of chronic noncardiac CP related to esophageal spasm and stricture.   Aortic stenosis: Last echocardiogram March 2023 showed mild aortic stenosis with mean gradient of 10 mmHg.  LV function 60 to 65%.  History of Present Illness:   Ms. Totino seen in the emergency room 09/13/2021 after a fall.  She was coming into her house after walking with her dog.  Per prior note, she fell dizzy and hit her forehead into a door frame.  No loss of consciousness or prodromal chest pain, shortness of breath or palpitation.  She was seen in the emergency room and underwent stitches for laceration of forehead. Hs-troponin 19>>57.  Nonspecific ST/T wave changes in lateral leads.  She declined further work-up and left AMA.  Yesterday patient had episode of chest pain while doing "nothing".  Felt like pressure-like sensation.  Associated with shortness of  breath, diaphoresis and nausea.  No palpitation.  Felt like prior angina when had stenting but not like her GI symptoms.  Spontaneously resolved within 5 to 10 minutes.  She called our office and directed to ER.  No recurrent symptoms since then.    Reports balance issue.  She has cane but never used.  She reports exertional shortness of breath with walking and going up stairs.  No associated chest pressure.  She lost her husband in May.  She lives by herself.  Troponin elevated from prior and started on IV heparin. 450>>424>>469>>483 On heparin.     Past Medical History:  Diagnosis Date   Anxiety    Arthritis    Blood transfusion without reported diagnosis    childhood   Carpal tunnel syndrome, bilateral 07/31/2015   Cataract    Cervical spondylosis without myelopathy    Chronic chest pain    Chronic kidney disease    Chronic low back pain    Chronotropic incompetence    CKD (chronic kidney disease), stage III (HCC)    Coronary artery disease    a. inf-post MI 2011 s/p DES to RCA. b. DES to RCA 06/2010. c. DES to LAD 2014, residual dz treated medically.   Depression    DVT (deep venous thrombosis) (HCC)    left   Esophageal stricture    GERD (gastroesophageal reflux disease)    Gross hematuria    Heart murmur    History of esophageal dilatation    FOR STRIUCTURE   History of gout  History of hiatal hernia    History of kidney stones    Hyperlipidemia    Hypertension    Hypothyroidism    Internal hemorrhoids    Irritable bowel syndrome    Mild aortic stenosis    by echo 2018   Myocardial infarction ([place])    Obesity    Orthostatic hypotension    Partial seizure disorder ([place]) NEUROLOGIST-- DR Jannifer Franklin   NOCTURNAL   PONV (postoperative nausea and vomiting)    hard time getting iv site-had to do neck stick 2 yrs ago   Pulmonary fibrosis (HCC)    S/P pericardial cyst excision    02-05-2013  benign   Seizures ([place])    none in yrs   SVT (supraventricular tachycardia)  (HCC)    Trigger finger, acquired 09/30/2015   Right middle finger   Vitamin D deficiency     Past Surgical History:  Procedure Laterality Date   BIOPSY OF MEDIASTINAL MASS N/A 02/05/2013   Procedure: RESECTION OF MEDIASTINAL MASS;  Surgeon: Ivin Poot, MD;  Location: [facility];  Service: Thoracic;  Laterality: N/A;   CARDIAC CATHETERIZATION Right 01/24/2015   Procedure: right femoral ARTERIAL LINE INSERTION;  Surgeon: Ivin Poot, MD;  Location: [facility];  Service: Thoracic;  Laterality: Right;   CARDIOVASCULAR STRESS TEST  11-22-2013  dr Irish Lack   normal lexiscan study/  no ischemia/  normal LVF and wall motion/ ef 81%   COLONOSCOPY  last one 2014   CORONARY ANGIOGRAM  03/10/2011   Procedure: CORONARY ANGIOGRAM;  Surgeon: Jettie Booze, MD;  Location: [facility] CATH LAB;  Service: Cardiovascular;;   CORONARY ANGIOPLASTY WITH STENT PLACEMENT  11-22-2009  dr Irish Lack   Acute inferoposterior MI/  ef 60%,  PCI with DES x1 to dRCA,  25%  mLAD   CORONARY ANGIOPLASTY WITH STENT PLACEMENT  06-26-2010  dr Daneen Schick   Cutting balloon angioplasty to dRCA with DES x1,  40% mLAD (non-obstructive CAD)   CYSTOSCOPY WITH RETROGRADE PYELOGRAM, URETEROSCOPY AND STENT PLACEMENT Right 02/13/2014   Procedure: CYSTOSCOPY WITH RETROGRADE PYELOGRAM, URETEROSCOPY AND STENT PLACEMENT;  Surgeon: Bernestine Amass, MD;  Location[facility];  Service: Urology;  Laterality: Right;   ECTOPIC PREGNANCY SURGERY  YRS AGO   SALPINGECTOMY   ESOPHAGOGASTRODUODENOSCOPY N/A 02/15/2014   Procedure: ESOPHAGOGASTRODUODENOSCOPY (EGD);  Surgeon: Jerene Bears, MD;  Location: [facility];  Service: Endoscopy;  Laterality: N/A;   ESOPHAGOGASTRODUODENOSCOPY (EGD) WITH ESOPHAGEAL DILATION  05-20-2011   HOLMIUM LASER APPLICATION Right 08/13/5051   Procedure: HOLMIUM LASER APPLICATION;  Surgeon: Bernestine Amass, MD;  Location[facility];  Service: Urology;  Laterality: Right;   LEFT HEART CATH AND CORONARY  ANGIOGRAPHY N/A 09/12/2019   Procedure: LEFT HEART CATH AND CORONARY ANGIOGRAPHY;  Surgeon: Jettie Booze, MD;  Location: [facility] CV LAB;  Service: Cardiovascular;  Laterality: N/A;   LEFT HEART CATHETERIZATION WITH CORONARY ANGIOGRAM N/A 03/14/2012   Procedure: LEFT HEART CATHETERIZATION WITH CORONARY ANGIOGRAM;  Surgeon: Sueanne Margarita, MD;  Location: [place] CATH LAB;  Service: Cardiovascular;  Laterality: N/A;  Normal LM,  50% pLAD,  70% mLAD,  D2 50-70%, very tortuous LAD,  70-82% ostial LCFX,  50% in-stent restenosis of  dRCA and mRCA  stent and 50-70% ostial PDA,  normal LVSF, ef 60%   LUNG BIOPSY Right 01/24/2015   Procedure: RIGHT LUNG BIOPSY;  Surgeon: Ivin Poot, MD;  Location: [place];  Service: Thoracic;  Laterality: Right;   MEDIASTERNOTOMY N/A 02/05/2013  Procedure: MEDIAN STERNOTOMY;  Surgeon: Ivin Poot, MD;  Location: [place];  Service: Thoracic;  Laterality: N/A;   MEDIASTERNOTOMY Left 02/05/2013   NEEDLE GUIDED EXCISION BREAST CALCIFICATIONS Right 08-09-2008   PERCUTANEOUS CORONARY STENT INTERVENTION (PCI-S) N/A 04/03/2012   Procedure: PERCUTANEOUS CORONARY STENT INTERVENTION (PCI-S);  Surgeon: Jettie Booze, MD;  Location: [facility] CATH LAB;  Service: Cardiovascular;  Laterality: N/A;   Successful PCI  mLAD with 2.75x12 Promus stent, postdilated to >0.72m   THORACOTOMY Right 01/24/2015   Procedure: RIGHT MINI/LIMITED THORACOTOMY;  Surgeon: PIvin Poot MD;  [facility];  Service: Thoracic;  Laterality: Right;   TRANSTHORACIC ECHOCARDIOGRAM  11-17-2011   grade I diastolic dysfunction/  ef 55-60%   VIDEO BRONCHOSCOPY N/A 01/24/2015   Procedure: RIGHT VIDEO BRONCHOSCOPY;  Surgeon: PIvin Poot MD;  [facility];  Service: Thoracic;  Laterality: N/A;     Inpatient Medications: Scheduled Meds:  ALPRAZolam  0.25 mg Oral BID   aspirin EC  81 mg Oral Daily   calcitRIOL  0.25 mcg Oral Daily   diltiazem  180 mg Oral Daily   DULoxetine  60 mg Oral Daily    levETIRAcetam  500 mg Oral BID   levothyroxine  100 mcg Oral q morning   nebivolol  2.5 mg Oral BID   pantoprazole  40 mg Oral BID   rosuvastatin  40 mg Oral Daily   Continuous Infusions:  heparin 650 Units/hr (09/15/21 1449)   PRN Meds: acetaminophen, ondansetron (ZOFRAN) IV  Allergies:   No Known Allergies  Social History:   Social History   Socioeconomic History   Marital status: Married    Spouse name: LFritz Pickerel  Number of children: 0   Years of education: hs   Highest education level: Not on file  Occupational History   Occupation: Retired    [employer] farm manager RETIRED  Tobacco Use   Smoking status: Former    Packs/day: 0.30    Years: 3.00    Total pack years: 0.90    Types: Cigarettes    Start date: 11/08/1964    Quit date: 11/09/1967    Years since quitting: 53.8   Smokeless tobacco: Never  Vaping Use   Vaping Use: Never used  Substance and Sexual Activity   Alcohol use: No    Alcohol/week: 0.0 standard drinks of alcohol   Drug use: No   Sexual activity: Not on file  Other Topics Concern   Not on file  Social History Narrative   Lives at home, married   Left-handed   Daily caffeine use: coffee.   Social Determinants of Health   Financial Resource Strain: Not on file  Food Insecurity: Not on file  Transportation Needs: Not on file  Physical Activity: Not on file  Stress: Not on file  Social Connections: Not on file  Intimate Partner Violence: Not on file    Family History:   Family History  Problem Relation Age of Onset   Heart attack Mother    CVA Mother    Heart disease Father    Hypertension Father    Emphysema Father    Coronary artery disease Father    Diabetes Mellitus I Father    Breast cancer Sister    Breast cancer Niece        niece   Colon cancer Neg Hx    Stomach cancer Neg Hx    Esophageal cancer Neg Hx    Rectal cancer Neg Hx    Seizures Neg Hx  ROS:  Please see the history of present illness.  All other ROS reviewed and  negative.     Physical Exam/Data:   Vitals:   09/16/21 0230 09/16/21 0430 09/16/21 0516 09/16/21 0822  BP: 137/66 139/87  (!) 150/77  Pulse: 65 63  69  Resp: '12 11  10  '$ Temp:   (!) 97.5 F (36.4 C)   TempSrc:   Oral   SpO2: 95% 95%  98%  Weight:      Height:       No intake or output data in the 24 hours ending 09/16/21 0931    09/15/2021    9:53 AM 03/11/2021   11:30 AM 02/02/2021   10:01 AM  Last 3 Weights  Weight (lbs) 120 lb 147 lb 8 oz 149 lb 3.2 oz  Weight (kg) 54.432 kg 66.906 kg 67.677 kg     Body mass index is 24.24 kg/m.  General:  Well nourished, well developed, in no acute distress HEENT: normal Neck: no JVD Vascular: No carotid bruits; Distal pulses 2+ bilaterally Cardiac:  normal S1, S2; RRR; 2/6 systolic  murmur  Lungs:  clear to auscultation bilaterally, no wheezing, rhonchi or rales  Abd: soft, nontender, no hepatomegaly  Ext: no edema Musculoskeletal:  No deformities, BUE and BLE strength normal and equal Skin: warm and dry  Neuro:  CNs 2-12 intact, no focal abnormalities noted Psych:  Normal affect   EKG:  The EKG was personally reviewed and demonstrates: Normal sinus rhythm Telemetry:  Telemetry was personally reviewed and demonstrates:  NSR  Relevant CV Studies:  Echo 03/2021: IMPRESSIONS     1. Left ventricular ejection fraction, by estimation, is 60 to 65%. The  left ventricle has normal function. The left ventricle has no regional  wall motion abnormalities. There is mild left ventricular hypertrophy.  Left ventricular diastolic parameters  are consistent with Grade II diastolic dysfunction (pseudonormalization).   2. Right ventricular systolic function is normal. The right ventricular  size is normal. Tricuspid regurgitation signal is inadequate for assessing  PA pressure.   3. Left atrial size was mildly dilated.   4. The mitral valve is degenerative. Mild mitral valve regurgitation. No  evidence of mitral stenosis. Moderate mitral  annular calcification.   5. The aortic valve is tricuspid. There is moderate calcification of the  aortic valve. Aortic valve regurgitation is not visualized. Mild aortic  valve stenosis. Aortic valve mean gradient measures 10.0 mmHg.   6. The inferior vena cava is normal in size with greater than 50%  respiratory variability, suggesting right atrial pressure of 3 mmHg.   LEFT HEART CATH AND CORONARY ANGIOGRAPHY 09/2019   Conclusion    Previously placed Mid RCA to Dist RCA stent (unknown type) is widely patent. Previously placed RPAV stent (unknown type) is widely patent. RPDA lesion is 40% stenosed. Previously placed Mid LAD stent (unknown type) is widely patent. 2nd Diag lesion is 40% stenosed. The left ventricular systolic function is normal. LV end diastolic pressure is normal. The left ventricular ejection fraction is 55-65% by visual estimate. There is no aortic valve stenosis.   Nonobstructive CAD and patent stents.     Small radial artery.  Tortuous right subclavian.  Can perform diagnostic cath from radial artery but it is small.  If PCI was needed, would consider femoral approach.    Diagnostic Dominance: Right   Laboratory Data:  High Sensitivity Troponin:   Recent Labs  Lab 09/13/21 1249 09/15/21 1126 09/15/21 1449 09/15/21 2247 09/16/21  0841  TROPONINIHS 57* 450* 424* 483*  469* 399*     Chemistry Recent Labs  Lab 09/13/21 1009 09/15/21 1126  NA 143 140  K 3.8 4.3  CL 113* 110  CO2 22 21*  GLUCOSE 116* 103*  BUN 14 15  CREATININE 1.39* 1.12*  CALCIUM 9.8 9.6  GFRNONAA 40* 51*  ANIONGAP 8 9    No results for input(s): "PROT", "ALBUMIN", "AST", "ALT", "ALKPHOS", "BILITOT" in the last 168 hours. Lipids  Recent Labs  Lab 09/16/21 0500  CHOL 138  TRIG 110  HDL 56  LDLCALC 60  CHOLHDL 2.5    Hematology Recent Labs  Lab 09/13/21 1009 09/15/21 1126 09/16/21 0500  WBC 11.6* 8.4 9.0  RBC 4.53 4.81 4.47  HGB 12.9 13.5 12.6  HCT 40.6 43.4  40.3  MCV 89.6 90.2 90.2  MCH 28.5 28.1 28.2  MCHC 31.8 31.1 31.3  RDW 12.9 13.1 12.9  PLT 269 285 294   Thyroid No results for input(s): "TSH", "FREET4" in the last 168 hours.  BNPNo results for input(s): "BNP", "PROBNP" in the last 168 hours.  DDimer No results for input(s): "DDIMER" in the last 168 hours.   Radiology/Studies:  DG Chest 2 View  Result Date: 09/15/2021 CLINICAL DATA:  Chest pain beginning today, fell and struck head on Sunday EXAM: CHEST - 2 VIEW COMPARISON:  08/18/2017 FINDINGS: Normal heart size post median sternotomy. Mediastinal contours and pulmonary vascularity normal. Atherosclerotic calcification aorta. Chronic and elevation of RIGHT diaphragm and minimal lingular scarring. No acute infiltrate, pleural effusion, or pneumothorax. Bones demineralized. IMPRESSION: Chronic elevation of RIGHT diaphragm and minimal lingular scarring. No acute abnormalities. Aortic Atherosclerosis (ICD10-I70.0). Electronically Signed   By: Lavonia Dana M.D.   On: 09/15/2021 11:05   CT Head Wo Contrast  Result Date: 09/13/2021 CLINICAL DATA:  Patient reports that she lost her balance while walking and fell, hitting the door. Patient does take Aspirin. Patient denies any blurred vision or nausea. Patient denies LOC. EXAM: CT HEAD WITHOUT CONTRAST CT CERVICAL SPINE WITHOUT CONTRAST TECHNIQUE: Multidetector CT imaging of the head and cervical spine was performed following the standard protocol without intravenous contrast. Multiplanar CT image reconstructions of the cervical spine were also generated. RADIATION DOSE REDUCTION: This exam was performed according to the departmental dose-optimization program which includes automated exposure control, adjustment of the mA and/or kV according to patient size and/or use of iterative reconstruction technique. COMPARISON:  Head CT, 09/20/2017. FINDINGS: CT HEAD FINDINGS Brain: No evidence of acute infarction, hemorrhage, hydrocephalus, extra-axial collection  or mass lesion/mass effect. Widened extra-axial space over the posterior left frontal lobe extending along the left sylvian fissure, unchanged. Patchy areas of white matter hypoattenuation bilaterally consistent with mild chronic microvascular ischemic change, also stable. Vascular: No hyperdense vessel or unexpected calcification. Skull: Normal. Negative for fracture or focal lesion. Sinuses/Orbits: Globes and orbits are unremarkable. Moderate right maxillary sinus mucosal thickening. Remaining visualized sinuses are clear. Other: Right frontal scalp laceration extending to the outer table of the frontal bone. CT CERVICAL SPINE FINDINGS Alignment: Straightened cervical lordosis.  No spondylolisthesis. Skull base and vertebrae: No acute fracture. No primary bone lesion or focal pathologic process. Soft tissues and spinal canal: No prevertebral fluid or swelling. No visible canal hematoma. Disc levels: Mild loss of disc height at C3-C4 with moderate to marked loss of disc height at C4-C5, C5-C6 and C6-C7. Mild disc bulging with endplate spurring. No convincing disc herniation. Facet degenerative changes, greatest on the right at C2-C3 and C3-C4.  Upper chest: No acute findings. Other: None. IMPRESSION: HEAD CT 1. No acute intracranial abnormalities. 2. Right frontal scalp laceration.  No underlying fracture. CERVICAL CT 1. No fracture or acute finding. Electronically Signed   By: Lajean Manes M.D.   On: 09/13/2021 10:20   CT Cervical Spine Wo Contrast  Result Date: 09/13/2021 CLINICAL DATA:  Patient reports that she lost her balance while walking and fell, hitting the door. Patient does take Aspirin. Patient denies any blurred vision or nausea. Patient denies LOC. EXAM: CT HEAD WITHOUT CONTRAST CT CERVICAL SPINE WITHOUT CONTRAST TECHNIQUE: Multidetector CT imaging of the head and cervical spine was performed following the standard protocol without intravenous contrast. Multiplanar CT image reconstructions of the  cervical spine were also generated. RADIATION DOSE REDUCTION: This exam was performed according to the departmental dose-optimization program which includes automated exposure control, adjustment of the mA and/or kV according to patient size and/or use of iterative reconstruction technique. COMPARISON:  Head CT, 09/20/2017. FINDINGS: CT HEAD FINDINGS Brain: No evidence of acute infarction, hemorrhage, hydrocephalus, extra-axial collection or mass lesion/mass effect. Widened extra-axial space over the posterior left frontal lobe extending along the left sylvian fissure, unchanged. Patchy areas of white matter hypoattenuation bilaterally consistent with mild chronic microvascular ischemic change, also stable. Vascular: No hyperdense vessel or unexpected calcification. Skull: Normal. Negative for fracture or focal lesion. Sinuses/Orbits: Globes and orbits are unremarkable. Moderate right maxillary sinus mucosal thickening. Remaining visualized sinuses are clear. Other: Right frontal scalp laceration extending to the outer table of the frontal bone. CT CERVICAL SPINE FINDINGS Alignment: Straightened cervical lordosis.  No spondylolisthesis. Skull base and vertebrae: No acute fracture. No primary bone lesion or focal pathologic process. Soft tissues and spinal canal: No prevertebral fluid or swelling. No visible canal hematoma. Disc levels: Mild loss of disc height at C3-C4 with moderate to marked loss of disc height at C4-C5, C5-C6 and C6-C7. Mild disc bulging with endplate spurring. No convincing disc herniation. Facet degenerative changes, greatest on the right at C2-C3 and C3-C4. Upper chest: No acute findings. Other: None. IMPRESSION: HEAD CT 1. No acute intracranial abnormalities. 2. Right frontal scalp laceration.  No underlying fracture. CERVICAL CT 1. No fracture or acute finding. Electronically Signed   By: Lajean Manes M.D.   On: 09/13/2021 10:20     Assessment and Plan:   Non-STEMI Her symptoms could  be anginal equivalent.  Also had recent dyspnea on exertion without chest pain.  Reporting trended up from 3 days ago.  No current chest pain since admitted.  Given symptoms and elevated troponin plan for cardiac catheterization. -Continue heparin -Continue aspirin, Bystolic and Crestor -Pending echocardiogram -During last cath recommended femoral approach for PCI given small radial and torturous subclavian  Shared Decision Making/Informed Consent The risks [stroke (1 in 1000), death (1 in 1000), kidney failure [usually temporary] (1 in 500), bleeding (1 in 200), allergic reaction [possibly serious] (1 in 200)], benefits (diagnostic support and management of coronary artery disease) and alternatives of a cardiac catheterization were discussed in detail with Ms. Haymaker and she is willing to proceed.    Please update cath cath result to niece. I have told her cath plan.   2.  Mild aortic stenosis -Pending echocardiogram  3.  Hyperlipidemia -09/16/2021: Cholesterol 138; HDL 56; LDL Cholesterol 60; Triglycerides 110; VLDL 22  -Continue Crestor  Otherwise per primary team  Risk Assessment/Risk Scores:     TIMI Risk Score for Unstable Angina or Non-ST Elevation MI:   The patient's  TIMI risk score is 5, which indicates a 26% risk of all cause mortality, new or recurrent myocardial infarction or need for urgent revascularization in the next 14 days.{   For questions or updates, please contact [place] Please consult [email or web address] for contact info under    Jarrett Soho, PA  09/16/2021 9:31 AM   Personally seen and examined. Agree with above.  76 year old with history of coronary artery disease as described above with prior stents to RCA and LAD here with non-ST elevation myocardial infarction.  We will proceed with cardiac catheterization as described above.  Risk and benefits explained.  She is willing to proceed.  She also has mild aortic stenosis 10 mmHg mean  gradient.  She had a longstanding history of also noncardiac chest pain felt to be GI related potentially esophageal spasm and stricture.  Troponin elevation in the 460 range.  On 9/3 troponin was 57.  Creatinine 1.1 hemoglobin 09.4.  2/6 systolic murmur heard on exam.  Recent stitches to forehead after fall. She has been saddened because of her recent husband's death about 3 months ago here at [facility].  She has a niece that lives with her but she is gone quite a bit with work.  She also has a small dog, [name]. On heparin IV aspirin Bystolic Crestor echocardiogram pending. -Femoral approach for PCI given small radial and tortuous subclavian previously recommended.   Candee Furbish, MD

## 2021-09-16 NOTE — Progress Notes (Addendum)
PROGRESS NOTE    [name]  JOI:[id number] DOB: March 27, 1945 DOA: 09/15/2021 PCP: Jasmine Ada, MD     Brief Narrative:  [name] is a 76 y.o. female with medical history significant of HLD, hypothyroidism, anxiety/depression, GERD, HTN, epilepsy, SVT, CKD3a. Presenting with chest pain. She reports that 2 days ago she had a fall at home. She became dizzy after walking her dog. She lost her balance, fell, and hit her forehead. This resulted in a laceration of the right forehead. She denies LOC. She denies CP or palpitations prior to the fall. She came to the ED and had her laceration repaired. During that visit, it was noted that she had a mildly elevated troponin. It was recommended that she stay for further evaluation, but she left AMA. She reports since being home, she had intermittent episodes of midsternal chest pain. She called her cardiologist this morning when her symptoms did not improve and they recommended that she come to the ED for evaluation.   New events last 24 hours / Subjective: Patient seen in the emergency department.  Awaiting cardiology consultation and transfer to [facility].  She denies chest pain on my examination today.  Assessment & Plan:  Principal Problem:   NSTEMI (non-ST elevated myocardial infarction) ([place]) Active Problems:   Convulsions/seizures (H[facility])   Hypertension   Chest pain   Hyperlipidemia   Hypothyroidism   GERD   Paroxysmal SVT (supraventricular tachycardia) (H[facility])   Elevated troponin   Stage 3b chronic kidney disease (CKD) ([place])   NSTEMI -Transfer to [facility].  Cardiology planning for heart cath. Updated by Dr. Marlou Porch this morning  -Heparin drip -Aspirin  History of SVT Hypertension -Cardizem, bystolic   Hyperlipidemia -Crestor  GERD -PPI  CKD stage IIIa -Stable  Hypothyroidism -Synthroid  Seizure disorder -Keppra  Facial laceration -After fall.  Laceration repair in ED 9/3.  Stitches should be  removed 9/10  DVT prophylaxis: Heparin gtt   Code Status: DNR Family Communication: Updated niece over the phone  Disposition Plan:  Status is: Observation The patient will require care spanning > 2 midnights and should be moved to inpatient because: Heart cath today   Consultants:  Cardiology  Procedures:  None   Antimicrobials:  Anti-infectives (From admission, onward)    None        Objective: Vitals:   09/16/21 0822 09/16/21 0930 09/16/21 0959 09/16/21 1004  BP: (!) 150/77 (!) 148/76  (!) 142/77  Pulse: 69 70  65  Resp: '10  18 18  '$ Temp:    97.8 F (36.6 C)  TempSrc:      SpO2: 98% 98%  98%  Weight:      Height:        Intake/Output Summary (Last 24 hours) at 09/16/2021 1058 Last data filed at 09/16/2021 1056 Gross per 24 hour  Intake 158.2 ml  Output --  Net 158.2 ml   Filed Weights   09/15/21 0953  Weight: 54.4 kg    Examination:  General exam: Appears calm and comfortable  Respiratory system: Clear to auscultation. Respiratory effort normal. No respiratory distress. No conversational dyspnea.  Cardiovascular system: S1 & S2 heard, RRR. No murmurs. No pedal edema. Gastrointestinal system: Abdomen is nondistended, soft and nontender. Normal bowel sounds heard. Central nervous system: Alert and oriented. No focal neurological deficits. Speech clear.  Extremities: Symmetric in appearance  Skin: +laceration repair present on forehead  Psychiatry: Judgement and insight appear normal. Mood & affect appropriate.   Data  Reviewed: I have personally reviewed following labs and imaging studies  CBC: Recent Labs  Lab 09/13/21 1009 09/15/21 1126 09/16/21 0500  WBC 11.6* 8.4 9.0  NEUTROABS 8.7*  --   --   HGB 12.9 13.5 12.6  HCT 40.6 43.4 40.3  MCV 89.6 90.2 90.2  PLT 269 285 509   Basic Metabolic Panel: Recent Labs  Lab 09/13/21 1009 09/15/21 1126  NA 143 140  K 3.8 4.3  CL 113* 110  CO2 22 21*  GLUCOSE 116* 103*  BUN 14 15  CREATININE 1.39*  1.12*  CALCIUM 9.8 9.6   GFR: Estimated Creatinine Clearance: 32.7 mL/min (A) (by C-G formula based on SCr of 1.12 mg/dL (H)). Liver Function Tests: No results for input(s): "AST", "ALT", "ALKPHOS", "BILITOT", "PROT", "ALBUMIN" in the last 168 hours. No results for input(s): "LIPASE", "AMYLASE" in the last 168 hours. No results for input(s): "AMMONIA" in the last 168 hours. Coagulation Profile: No results for input(s): "INR", "PROTIME" in the last 168 hours. Cardiac Enzymes: No results for input(s): "CKTOTAL", "CKMB", "CKMBINDEX", "TROPONINI" in the last 168 hours. BNP (last 3 results) No results for input(s): "PROBNP" in the last 8760 hours. HbA1C: No results for input(s): "HGBA1C" in the last 72 hours. CBG: Recent Labs  Lab 09/13/21 1017  GLUCAP 114*   Lipid Profile: Recent Labs    09/16/21 0500  CHOL 138  HDL 56  LDLCALC 60  TRIG 110  CHOLHDL 2.5   Thyroid Function Tests: No results for input(s): "TSH", "T4TOTAL", "FREET4", "T3FREE", "THYROIDAB" in the last 72 hours. Anemia Panel: No results for input(s): "VITAMINB12", "FOLATE", "FERRITIN", "TIBC", "IRON", "RETICCTPCT" in the last 72 hours. Sepsis Labs: No results for input(s): "PROCALCITON", "LATICACIDVEN" in the last 168 hours.  No results found for this or any previous visit (from the past 240 hour(s)).    Radiology Studies: DG Chest 2 View  Result Date: 09/15/2021 CLINICAL DATA:  Chest pain beginning today, fell and struck head on Sunday EXAM: CHEST - 2 VIEW COMPARISON:  08/18/2017 FINDINGS: Normal heart size post median sternotomy. Mediastinal contours and pulmonary vascularity normal. Atherosclerotic calcification aorta. Chronic and elevation of RIGHT diaphragm and minimal lingular scarring. No acute infiltrate, pleural effusion, or pneumothorax. Bones demineralized. IMPRESSION: Chronic elevation of RIGHT diaphragm and minimal lingular scarring. No acute abnormalities. Aortic Atherosclerosis (ICD10-I70.0).  Electronically Signed   By: Lavonia Dana M.D.   On: 09/15/2021 11:05      Scheduled Meds:  ALPRAZolam  0.25 mg Oral BID   aspirin EC  81 mg Oral Daily   calcitRIOL  0.25 mcg Oral Daily   diltiazem  180 mg Oral Daily   DULoxetine  60 mg Oral Daily   levETIRAcetam  500 mg Oral BID   levothyroxine  100 mcg Oral q morning   nebivolol  2.5 mg Oral BID   pantoprazole  40 mg Oral BID   rosuvastatin  40 mg Oral Daily   sodium chloride flush  3 mL Intravenous Q12H   Continuous Infusions:  heparin       LOS: 0 days     Dessa Phi, DO [facility] 09/16/2021, 10:58 AM   Available via Epic secure chat 7am-7pm After these hours, please refer to coverage provider listed on [email or web address]

## 2021-09-16 NOTE — Interval H&P Note (Signed)
History and Physical Interval Note:  09/16/2021 4:14 PM  [name]  has presented today for surgery, with the diagnosis of nstemi.  The various methods of treatment have been discussed with the patient and family. After consideration of risks, benefits and other options for treatment, the patient has consented to  Procedure(s): LEFT HEART CATH AND CORONARY ANGIOGRAPHY (N/A) as a surgical intervention.  The patient's history has been reviewed, patient examined, no change in status, stable for surgery.  I have reviewed the patient's chart and labs.  Questions were answered to the patient's satisfaction.    Cath Lab Visit (complete for each Cath Lab visit)  Clinical Evaluation Leading to the Procedure:   ACS: Yes.    Non-ACS:    Anginal Classification: CCS III  Anti-ischemic medical therapy: Maximal Therapy (2 or more classes of medications)  Non-Invasive Test Results: No non-invasive testing performed  Prior CABG: No previous CABG       Collier [facility] 09/16/2021 4:15 PM

## 2021-09-16 NOTE — Progress Notes (Signed)
Pt to cath lab holding, [place] [address], settled by [employer], RN's, connected to monitor, denies pain, safety maintained, laceration to right forehead with sutures noted, no new drainage noted

## 2021-09-16 NOTE — Progress Notes (Signed)
2D Echocardiogram has been performed.  [name] 09/16/2021, 12:06 PM

## 2021-09-16 NOTE — ED Notes (Signed)
Pt resting, respirations equal & unlabored, will continue to monitor.

## 2021-09-16 NOTE — Consult Note (Addendum)
Cardiology Consultation   Patient ID: [name] MRN: [id number]; DOB: Jul 08, 1945  Admit date: 09/15/2021 Date of Consult: 09/16/2021  PCP:  Carol Ada, [facility] Cardiologist:  Fransico Him, MD   {  Patient Profile:   [name] is a 76 y.o. female with a hx of coronary artery disease, chronic noncardiac chest pain, hypertension, SVT, pericardial cyst s/p resection, hyperlipidemia, seizure, mild aortic stenosis and prior history of orthostatic hypotension who is being seen 09/16/2021 for the evaluation of elevated troponin at the request of Dr. Marylyn Ishihara.  Hx of CAD: -s/p inf-post MI 2011 s/p DES to RCA.  -recurrent CP s/p DES to RCA 06/2010  -recurrent CP s/p  DES to LAD 2014 with residual dz treated medically.   -recurrent CP 08/2018 with no ischemia on nuclear stress test 09/2018 -Last cardiac cath 09/2019 showed patent stents and 40% RPDA and Diagonal>> medical management.  Recommended femoral approach for future PCI given small radial artery and torturous right subclavian.   Non cardiac chest pain:  she has a long hx of chronic noncardiac CP related to esophageal spasm and stricture.   Aortic stenosis: Last echocardiogram March 2023 showed mild aortic stenosis with mean gradient of 10 mmHg.  LV function 60 to 65%.  History of Present Illness:   [name] seen in the emergency room 09/13/2021 after a fall.  She was coming into her house after walking with her dog.  Per prior note, she fell dizzy and hit her forehead into a door frame.  No loss of consciousness or prodromal chest pain, shortness of breath or palpitation.  She was seen in the emergency room and underwent stitches for laceration of forehead. Hs-troponin 19>>57.  Nonspecific ST/T wave changes in lateral leads.  She declined further work-up and left AMA.  Yesterday patient had episode of chest pain while doing "nothing".  Felt like pressure-like sensation.  Associated with shortness of  breath, diaphoresis and nausea.  No palpitation.  Felt like prior angina when had stenting but not like her GI symptoms.  Spontaneously resolved within 5 to 10 minutes.  She called our office and directed to ER.  No recurrent symptoms since then.    Reports balance issue.  She has cane but never used.  She reports exertional shortness of breath with walking and going up stairs.  No associated chest pressure.  She lost her husband in May.  She lives by herself.  Troponin elevated from prior and started on IV heparin. 450>>424>>469>>483 On heparin.     Past Medical History:  Diagnosis Date   Anxiety    Arthritis    Blood transfusion without reported diagnosis    childhood   Carpal tunnel syndrome, bilateral 07/31/2015   Cataract    Cervical spondylosis without myelopathy    Chronic chest pain    Chronic kidney disease    Chronic low back pain    Chronotropic incompetence    CKD (chronic kidney disease), stage III (HCC)    Coronary artery disease    a. inf-post MI 2011 s/p DES to RCA. b. DES to RCA 06/2010. c. DES to LAD 2014, residual dz treated medically.   Depression    DVT (deep venous thrombosis) (HCC)    left   Esophageal stricture    GERD (gastroesophageal reflux disease)    Gross hematuria    Heart murmur    History of esophageal dilatation    FOR STRIUCTURE   History of gout  History of hiatal hernia    History of kidney stones    Hyperlipidemia    Hypertension    Hypothyroidism    Internal hemorrhoids    Irritable bowel syndrome    Mild aortic stenosis    by echo 2018   Myocardial infarction ([place])    Obesity    Orthostatic hypotension    Partial seizure disorder ([place]) NEUROLOGIST-- DR Jannifer Franklin   NOCTURNAL   PONV (postoperative nausea and vomiting)    hard time getting iv site-had to do neck stick 2 yrs ago   Pulmonary fibrosis (HCC)    S/P pericardial cyst excision    02-05-2013  benign   Seizures ([place])    none in yrs   SVT (supraventricular tachycardia)  (HCC)    Trigger finger, acquired 09/30/2015   Right middle finger   Vitamin D deficiency     Past Surgical History:  Procedure Laterality Date   BIOPSY OF MEDIASTINAL MASS N/A 02/05/2013   Procedure: RESECTION OF MEDIASTINAL MASS;  Surgeon: Ivin Poot, MD;  Location: [place];  Service: Thoracic;  Laterality: N/A;   CARDIAC CATHETERIZATION Right 01/24/2015   Procedure: right femoral ARTERIAL LINE INSERTION;  Surgeon: Ivin Poot, MD;  Location: [place];  Service: Thoracic;  Laterality: Right;   CARDIOVASCULAR STRESS TEST  11-22-2013  dr Irish Lack   normal lexiscan study/  no ischemia/  normal LVF and wall motion/ ef 81%   COLONOSCOPY  last one 2014   CORONARY ANGIOGRAM  03/10/2011   Procedure: CORONARY ANGIOGRAM;  Surgeon: Jettie Booze, MD;  Location: [facility] CATH LAB;  Service: Cardiovascular;;   CORONARY ANGIOPLASTY WITH STENT PLACEMENT  11-22-2009  dr Irish Lack   Acute inferoposterior MI/  ef 60%,  PCI with DES x1 to dRCA,  25%  mLAD   CORONARY ANGIOPLASTY WITH STENT PLACEMENT  06-26-2010  dr Daneen Schick   Cutting balloon angioplasty to dRCA with DES x1,  40% mLAD (non-obstructive CAD)   CYSTOSCOPY WITH RETROGRADE PYELOGRAM, URETEROSCOPY AND STENT PLACEMENT Right 02/13/2014   Procedure: CYSTOSCOPY WITH RETROGRADE PYELOGRAM, URETEROSCOPY AND STENT PLACEMENT;  Surgeon: Bernestine Amass, MD;  Location[facility];  Service: Urology;  Laterality: Right;   ECTOPIC PREGNANCY SURGERY  YRS AGO   SALPINGECTOMY   ESOPHAGOGASTRODUODENOSCOPY N/A 02/15/2014   Procedure: ESOPHAGOGASTRODUODENOSCOPY (EGD);  Surgeon: Jerene Bears, MD;  Location: [facility];  Service: Endoscopy;  Laterality: N/A;   ESOPHAGOGASTRODUODENOSCOPY (EGD) WITH ESOPHAGEAL DILATION  05-20-2011   HOLMIUM LASER APPLICATION Right 08/14/6960   Procedure: HOLMIUM LASER APPLICATION;  Surgeon: Bernestine Amass, MD;  Location: [facility];  Service: Urology;  Laterality: Right;   LEFT HEART CATH AND CORONARY  ANGIOGRAPHY N/A 09/12/2019   Procedure: LEFT HEART CATH AND CORONARY ANGIOGRAPHY;  Surgeon: Jettie Booze, MD;  Location: [place] CV LAB;  Service: Cardiovascular;  Laterality: N/A;   LEFT HEART CATHETERIZATION WITH CORONARY ANGIOGRAM N/A 03/14/2012   Procedure: LEFT HEART CATHETERIZATION WITH CORONARY ANGIOGRAM;  Surgeon: Sueanne Margarita, MD;  Location: [place] CATH LAB;  Service: Cardiovascular;  Laterality: N/A;  Normal LM,  50% pLAD,  70% mLAD,  D2 50-70%, very tortuous LAD,  70-82% ostial LCFX,  50% in-stent restenosis of  dRCA and mRCA  stent and 50-70% ostial PDA,  normal LVSF, ef 60%   LUNG BIOPSY Right 01/24/2015   Procedure: RIGHT LUNG BIOPSY;  Surgeon: Ivin Poot, MD;  Location: [place];  Service: Thoracic;  Laterality: Right;   MEDIASTERNOTOMY N/A 02/05/2013  Procedure: MEDIAN STERNOTOMY;  Surgeon: Ivin Poot, MD;  Location: [place];  Service: Thoracic;  Laterality: N/A;   MEDIASTERNOTOMY Left 02/05/2013   NEEDLE GUIDED EXCISION BREAST CALCIFICATIONS Right 08-09-2008   PERCUTANEOUS CORONARY STENT INTERVENTION (PCI-S) N/A 04/03/2012   Procedure: PERCUTANEOUS CORONARY STENT INTERVENTION (PCI-S);  Surgeon: Jettie Booze, MD;  Location: [facility] CATH LAB;  Service: Cardiovascular;  Laterality: N/A;   Successful PCI  mLAD with 2.75x12 Promus stent, postdilated to >0.47m   THORACOTOMY Right 01/24/2015   Procedure: RIGHT MINI/LIMITED THORACOTOMY;  Surgeon: PIvin Poot MD;  [facility];  Service: Thoracic;  Laterality: Right;   TRANSTHORACIC ECHOCARDIOGRAM  11-17-2011   grade I diastolic dysfunction/  ef 55-60%   VIDEO BRONCHOSCOPY N/A 01/24/2015   Procedure: RIGHT VIDEO BRONCHOSCOPY;  Surgeon: PIvin Poot MD;  Location: [facility];  Service: Thoracic;  Laterality: N/A;     Inpatient Medications: Scheduled Meds:  ALPRAZolam  0.25 mg Oral BID   aspirin EC  81 mg Oral Daily   calcitRIOL  0.25 mcg Oral Daily   diltiazem  180 mg Oral Daily   DULoxetine  60 mg Oral Daily    levETIRAcetam  500 mg Oral BID   levothyroxine  100 mcg Oral q morning   nebivolol  2.5 mg Oral BID   pantoprazole  40 mg Oral BID   rosuvastatin  40 mg Oral Daily   Continuous Infusions:  heparin 650 Units/hr (09/15/21 1449)   PRN Meds: acetaminophen, ondansetron (ZOFRAN) IV  Allergies:   No Known Allergies  Social History:   Social History   Socioeconomic History   Marital status: Married    Spouse name: LFritz Pickerel  Number of children: 0   Years of education: hs   Highest education level: Not on file  Occupational History   Occupation: Retired    [employer] farm manager RETIRED  Tobacco Use   Smoking status: Former    Packs/day: 0.30    Years: 3.00    Total pack years: 0.90    Types: Cigarettes    Start date: 11/08/1964    Quit date: 11/09/1967    Years since quitting: 53.8   Smokeless tobacco: Never  Vaping Use   Vaping Use: Never used  Substance and Sexual Activity   Alcohol use: No    Alcohol/week: 0.0 standard drinks of alcohol   Drug use: No   Sexual activity: Not on file  Other Topics Concern   Not on file  Social History Narrative   Lives at home, married   Left-handed   Daily caffeine use: coffee.   Social Determinants of Health   Financial Resource Strain: Not on file  Food Insecurity: Not on file  Transportation Needs: Not on file  Physical Activity: Not on file  Stress: Not on file  Social Connections: Not on file  Intimate Partner Violence: Not on file    Family History:   Family History  Problem Relation Age of Onset   Heart attack Mother    CVA Mother    Heart disease Father    Hypertension Father    Emphysema Father    Coronary artery disease Father    Diabetes Mellitus I Father    Breast cancer Sister    Breast cancer Niece        niece   Colon cancer Neg Hx    Stomach cancer Neg Hx    Esophageal cancer Neg Hx    Rectal cancer Neg Hx    Seizures Neg Hx  ROS:  Please see the history of present illness.  All other ROS reviewed and  negative.     Physical Exam/Data:   Vitals:   09/16/21 0230 09/16/21 0430 09/16/21 0516 09/16/21 0822  BP: 137/66 139/87  (!) 150/77  Pulse: 65 63  69  Resp: '12 11  10  '$ Temp:   (!) 97.5 F (36.4 C)   TempSrc:   Oral   SpO2: 95% 95%  98%  Weight:      Height:       No intake or output data in the 24 hours ending 09/16/21 0931    09/15/2021    9:53 AM 03/11/2021   11:30 AM 02/02/2021   10:01 AM  Last 3 Weights  Weight (lbs) 120 lb 147 lb 8 oz 149 lb 3.2 oz  Weight (kg) 54.432 kg 66.906 kg 67.677 kg     Body mass index is 24.24 kg/m.  General:  Well nourished, well developed, in no acute distress HEENT: normal Neck: no JVD Vascular: No carotid bruits; Distal pulses 2+ bilaterally Cardiac:  normal S1, S2; RRR; 2/6 systolic  murmur  Lungs:  clear to auscultation bilaterally, no wheezing, rhonchi or rales  Abd: soft, nontender, no hepatomegaly  Ext: no edema Musculoskeletal:  No deformities, BUE and BLE strength normal and equal Skin: warm and dry  Neuro:  CNs 2-12 intact, no focal abnormalities noted Psych:  Normal affect   EKG:  The EKG was personally reviewed and demonstrates: Normal sinus rhythm Telemetry:  Telemetry was personally reviewed and demonstrates:  NSR  Relevant CV Studies:  Echo 03/2021: IMPRESSIONS     1. Left ventricular ejection fraction, by estimation, is 60 to 65%. The  left ventricle has normal function. The left ventricle has no regional  wall motion abnormalities. There is mild left ventricular hypertrophy.  Left ventricular diastolic parameters  are consistent with Grade II diastolic dysfunction (pseudonormalization).   2. Right ventricular systolic function is normal. The right ventricular  size is normal. Tricuspid regurgitation signal is inadequate for assessing  PA pressure.   3. Left atrial size was mildly dilated.   4. The mitral valve is degenerative. Mild mitral valve regurgitation. No  evidence of mitral stenosis. Moderate mitral  annular calcification.   5. The aortic valve is tricuspid. There is moderate calcification of the  aortic valve. Aortic valve regurgitation is not visualized. Mild aortic  valve stenosis. Aortic valve mean gradient measures 10.0 mmHg.   6. The inferior vena cava is normal in size with greater than 50%  respiratory variability, suggesting right atrial pressure of 3 mmHg.   LEFT HEART CATH AND CORONARY ANGIOGRAPHY 09/2019   Conclusion    Previously placed Mid RCA to Dist RCA stent (unknown type) is widely patent. Previously placed RPAV stent (unknown type) is widely patent. RPDA lesion is 40% stenosed. Previously placed Mid LAD stent (unknown type) is widely patent. 2nd Diag lesion is 40% stenosed. The left ventricular systolic function is normal. LV end diastolic pressure is normal. The left ventricular ejection fraction is 55-65% by visual estimate. There is no aortic valve stenosis.   Nonobstructive CAD and patent stents.     Small radial artery.  Tortuous right subclavian.  Can perform diagnostic cath from radial artery but it is small.  If PCI was needed, would consider femoral approach.    Diagnostic Dominance: Right   Laboratory Data:  High Sensitivity Troponin:   Recent Labs  Lab 09/13/21 1249 09/15/21 1126 09/15/21 1449 09/15/21 2247 09/16/21  0841  TROPONINIHS 57* 450* 424* 483*  469* 399*     Chemistry Recent Labs  Lab 09/13/21 1009 09/15/21 1126  NA 143 140  K 3.8 4.3  CL 113* 110  CO2 22 21*  GLUCOSE 116* 103*  BUN 14 15  CREATININE 1.39* 1.12*  CALCIUM 9.8 9.6  GFRNONAA 40* 51*  ANIONGAP 8 9    No results for input(s): "PROT", "ALBUMIN", "AST", "ALT", "ALKPHOS", "BILITOT" in the last 168 hours. Lipids  Recent Labs  Lab 09/16/21 0500  CHOL 138  TRIG 110  HDL 56  LDLCALC 60  CHOLHDL 2.5    Hematology Recent Labs  Lab 09/13/21 1009 09/15/21 1126 09/16/21 0500  WBC 11.6* 8.4 9.0  RBC 4.53 4.81 4.47  HGB 12.9 13.5 12.6  HCT 40.6 43.4  40.3  MCV 89.6 90.2 90.2  MCH 28.5 28.1 28.2  MCHC 31.8 31.1 31.3  RDW 12.9 13.1 12.9  PLT 269 285 294   Thyroid No results for input(s): "TSH", "FREET4" in the last 168 hours.  BNPNo results for input(s): "BNP", "PROBNP" in the last 168 hours.  DDimer No results for input(s): "DDIMER" in the last 168 hours.   Radiology/Studies:  DG Chest 2 View  Result Date: 09/15/2021 CLINICAL DATA:  Chest pain beginning today, fell and struck head on Sunday EXAM: CHEST - 2 VIEW COMPARISON:  08/18/2017 FINDINGS: Normal heart size post median sternotomy. Mediastinal contours and pulmonary vascularity normal. Atherosclerotic calcification aorta. Chronic and elevation of RIGHT diaphragm and minimal lingular scarring. No acute infiltrate, pleural effusion, or pneumothorax. Bones demineralized. IMPRESSION: Chronic elevation of RIGHT diaphragm and minimal lingular scarring. No acute abnormalities. Aortic Atherosclerosis (ICD10-I70.0). Electronically Signed   By: Lavonia Dana M.D.   On: 09/15/2021 11:05   CT Head Wo Contrast  Result Date: 09/13/2021 CLINICAL DATA:  Patient reports that she lost her balance while walking and fell, hitting the door. Patient does take Aspirin. Patient denies any blurred vision or nausea. Patient denies LOC. EXAM: CT HEAD WITHOUT CONTRAST CT CERVICAL SPINE WITHOUT CONTRAST TECHNIQUE: Multidetector CT imaging of the head and cervical spine was performed following the standard protocol without intravenous contrast. Multiplanar CT image reconstructions of the cervical spine were also generated. RADIATION DOSE REDUCTION: This exam was performed according to the departmental dose-optimization program which includes automated exposure control, adjustment of the mA and/or kV according to patient size and/or use of iterative reconstruction technique. COMPARISON:  Head CT, 09/20/2017. FINDINGS: CT HEAD FINDINGS Brain: No evidence of acute infarction, hemorrhage, hydrocephalus, extra-axial collection  or mass lesion/mass effect. Widened extra-axial space over the posterior left frontal lobe extending along the left sylvian fissure, unchanged. Patchy areas of white matter hypoattenuation bilaterally consistent with mild chronic microvascular ischemic change, also stable. Vascular: No hyperdense vessel or unexpected calcification. Skull: Normal. Negative for fracture or focal lesion. Sinuses/Orbits: Globes and orbits are unremarkable. Moderate right maxillary sinus mucosal thickening. Remaining visualized sinuses are clear. Other: Right frontal scalp laceration extending to the outer table of the frontal bone. CT CERVICAL SPINE FINDINGS Alignment: Straightened cervical lordosis.  No spondylolisthesis. Skull base and vertebrae: No acute fracture. No primary bone lesion or focal pathologic process. Soft tissues and spinal canal: No prevertebral fluid or swelling. No visible canal hematoma. Disc levels: Mild loss of disc height at C3-C4 with moderate to marked loss of disc height at C4-C5, C5-C6 and C6-C7. Mild disc bulging with endplate spurring. No convincing disc herniation. Facet degenerative changes, greatest on the right at C2-C3 and C3-C4.  Upper chest: No acute findings. Other: None. IMPRESSION: HEAD CT 1. No acute intracranial abnormalities. 2. Right frontal scalp laceration.  No underlying fracture. CERVICAL CT 1. No fracture or acute finding. Electronically Signed   By: Lajean Manes M.D.   On: 09/13/2021 10:20   CT Cervical Spine Wo Contrast  Result Date: 09/13/2021 CLINICAL DATA:  Patient reports that she lost her balance while walking and fell, hitting the door. Patient does take Aspirin. Patient denies any blurred vision or nausea. Patient denies LOC. EXAM: CT HEAD WITHOUT CONTRAST CT CERVICAL SPINE WITHOUT CONTRAST TECHNIQUE: Multidetector CT imaging of the head and cervical spine was performed following the standard protocol without intravenous contrast. Multiplanar CT image reconstructions of the  cervical spine were also generated. RADIATION DOSE REDUCTION: This exam was performed according to the departmental dose-optimization program which includes automated exposure control, adjustment of the mA and/or kV according to patient size and/or use of iterative reconstruction technique. COMPARISON:  Head CT, 09/20/2017. FINDINGS: CT HEAD FINDINGS Brain: No evidence of acute infarction, hemorrhage, hydrocephalus, extra-axial collection or mass lesion/mass effect. Widened extra-axial space over the posterior left frontal lobe extending along the left sylvian fissure, unchanged. Patchy areas of white matter hypoattenuation bilaterally consistent with mild chronic microvascular ischemic change, also stable. Vascular: No hyperdense vessel or unexpected calcification. Skull: Normal. Negative for fracture or focal lesion. Sinuses/Orbits: Globes and orbits are unremarkable. Moderate right maxillary sinus mucosal thickening. Remaining visualized sinuses are clear. Other: Right frontal scalp laceration extending to the outer table of the frontal bone. CT CERVICAL SPINE FINDINGS Alignment: Straightened cervical lordosis.  No spondylolisthesis. Skull base and vertebrae: No acute fracture. No primary bone lesion or focal pathologic process. Soft tissues and spinal canal: No prevertebral fluid or swelling. No visible canal hematoma. Disc levels: Mild loss of disc height at C3-C4 with moderate to marked loss of disc height at C4-C5, C5-C6 and C6-C7. Mild disc bulging with endplate spurring. No convincing disc herniation. Facet degenerative changes, greatest on the right at C2-C3 and C3-C4. Upper chest: No acute findings. Other: None. IMPRESSION: HEAD CT 1. No acute intracranial abnormalities. 2. Right frontal scalp laceration.  No underlying fracture. CERVICAL CT 1. No fracture or acute finding. Electronically Signed   By: Lajean Manes M.D.   On: 09/13/2021 10:20     Assessment and Plan:   Non-STEMI Her symptoms could  be anginal equivalent.  Also had recent dyspnea on exertion without chest pain.  Reporting trended up from 3 days ago.  No current chest pain since admitted.  Given symptoms and elevated troponin plan for cardiac catheterization. -Continue heparin -Continue aspirin, Bystolic and Crestor -Pending echocardiogram -During last cath recommended femoral approach for PCI given small radial and torturous subclavian  Shared Decision Making/Informed Consent The risks [stroke (1 in 1000), death (1 in 1000), kidney failure [usually temporary] (1 in 500), bleeding (1 in 200), allergic reaction [possibly serious] (1 in 200)], benefits (diagnostic support and management of coronary artery disease) and alternatives of a cardiac catheterization were discussed in detail with Ms. Mott and she is willing to proceed.    Please update cath cath result to niece. I have told her cath plan.   2.  Mild aortic stenosis -Pending echocardiogram  3.  Hyperlipidemia -09/16/2021: Cholesterol 138; HDL 56; LDL Cholesterol 60; Triglycerides 110; VLDL 22  -Continue Crestor  Otherwise per primary team  Risk Assessment/Risk Scores:     TIMI Risk Score for Unstable Angina or Non-ST Elevation MI:   The patient's  TIMI risk score is 5, which indicates a 26% risk of all cause mortality, new or recurrent myocardial infarction or need for urgent revascularization in the next 14 days.{   For questions or updates, please contact [place] Please consult [email or web address] for contact info under    Jarrett Soho, PA  09/16/2021 9:31 AM   Personally seen and examined. Agree with above.  76 year old with history of coronary artery disease as described above with prior stents to RCA and LAD here with non-ST elevation myocardial infarction.  We will proceed with cardiac catheterization as described above.  Risk and benefits explained.  She is willing to proceed.  She also has mild aortic stenosis 10 mmHg mean  gradient.  She had a longstanding history of also noncardiac chest pain felt to be GI related potentially esophageal spasm and stricture.  Troponin elevation in the 460 range.  On 9/3 troponin was 57.  Creatinine 1.1 hemoglobin 25.8.  2/6 systolic murmur heard on exam.  Recent stitches to forehead after fall. She has been saddened because of her recent husband's death about 3 months ago here at [facility].  She has a niece that lives with her but she is gone quite a bit with work.  She also has a small dog, [name]. On heparin IV aspirin Bystolic Crestor echocardiogram pending. -Femoral approach for PCI given small radial and tortuous subclavian previously recommended.   Candee Furbish, MD

## 2021-09-16 NOTE — Progress Notes (Signed)
ANTICOAGULATION CONSULT NOTE - follow up  Pharmacy Consult for Heparin Indication: chest pain/ACS  No Known Allergies  Patient Measurements: Height: '4\' 11"'$  (149.9 cm) Weight: 54.4 kg (120 lb) IBW/kg (Calculated) : 43.2 Heparin Dosing Weight: 54 kg  Vital Signs: Temp: 97.5 F (36.4 C) (09/06 0516) Temp Source: Oral (09/06 0516) BP: 148/76 (09/06 0930) Pulse Rate: 70 (09/06 0930)  Labs: Recent Labs    09/13/21 1009 09/13/21 1103 09/15/21 1126 09/15/21 1449 09/15/21 2247 09/16/21 0500 09/16/21 0841 09/16/21 0849  HGB 12.9  --  13.5  --   --  12.6  --   --   HCT 40.6  --  43.4  --   --  40.3  --   --   PLT 269  --  285  --   --  294  --   --   HEPARINUNFRC  --   --   --   --  0.39  --   --  0.22*  CREATININE 1.39*  --  1.12*  --   --   --   --   --   TROPONINIHS  --    < > 450* 424* 483*  469*  --  399*  --    < > = values in this interval not displayed.     Estimated Creatinine Clearance: 32.7 mL/min (A) (by C-G formula based on SCr of 1.12 mg/dL (H)).   Medical History: Past Medical History:  Diagnosis Date   Anxiety    Arthritis    Blood transfusion without reported diagnosis    childhood   Carpal tunnel syndrome, bilateral 07/31/2015   Cataract    Cervical spondylosis without myelopathy    Chronic chest pain    Chronic kidney disease    Chronic low back pain    Chronotropic incompetence    CKD (chronic kidney disease), stage III (HCC)    Coronary artery disease    a. inf-post MI 2011 s/p DES to RCA. b. DES to RCA 06/2010. c. DES to LAD 2014, residual dz treated medically.   Depression    DVT (deep venous thrombosis) (HCC)    left   Esophageal stricture    GERD (gastroesophageal reflux disease)    Gross hematuria    Heart murmur    History of esophageal dilatation    FOR STRIUCTURE   History of gout    History of hiatal hernia    History of kidney stones    Hyperlipidemia    Hypertension    Hypothyroidism    Internal hemorrhoids    Irritable  bowel syndrome    Mild aortic stenosis    by echo 2018   Myocardial infarction ([place])    Obesity    Orthostatic hypotension    Partial seizure disorder (HCC) NEUROLOGIST-- DR Jannifer Franklin   NOCTURNAL   PONV (postoperative nausea and vomiting)    hard time getting iv site-had to do neck stick 2 yrs ago   Pulmonary fibrosis (HCC)    S/P pericardial cyst excision    02-05-2013  benign   Seizures ([place])    none in yrs   SVT (supraventricular tachycardia) (HCC)    Trigger finger, acquired 09/30/2015   Right middle finger   Vitamin D deficiency    Assessment: Active Problem(s): Fall with head lacteration 9/3>>back to ED with new CP 9/5 Pt was here on Sunday after a dizzy spell>> fall hitting head. Pt had elevated enzymes (19, 57)  while she was here and the MD  recommended cardiology consult and the pt left AMA before consult could be done  AC/Heme: CP with elevated troponins and known h/o CAD (fall hitting head with laceration 9/3, CT negative) Baseline CBC WNL. Start IV heparin   Today, 09/16/21 2nd HL 0.22 sub-therapeutic with heparin infusing at  650 units/hr No bleeding or heparin line issues per nurse  Goal of Therapy:  Heparin level 0.3-0.7 units/ml Monitor platelets by anticoagulation protocol: Yes   Plan:  Increase heparin infusion to 700 units/hr Check heparin level in 8 hrs Monitor daily heparin level, CBC, signs/symptoms of bleeding F/U plans for cardiac catherterization    Thank you for allowing pharmacy to be a part of this patient's care.  Royetta Asal, PharmD, BCPS Clinical Pharmacist [place] Please utilize Amion for appropriate phone number to reach the unit pharmacist ([place]) 09/16/2021 10:08 AM

## 2021-09-16 NOTE — Plan of Care (Signed)
  Problem: Education: Goal: Knowledge of General Education information will improve Description: Including pain rating scale, medication(s)/side effects and non-pharmacologic comfort measures Outcome: Progressing   Problem: Health Behavior/Discharge Planning: Goal: Ability to manage health-related needs will improve Outcome: Progressing   Problem: Clinical Measurements: Goal: Ability to maintain clinical measurements within normal limits will improve Outcome: Progressing Goal: Will remain free from infection Outcome: Progressing Goal: Diagnostic test results will improve Outcome: Progressing Goal: Respiratory complications will improve Outcome: Progressing Goal: Cardiovascular complication will be avoided Outcome: Progressing   Problem: Activity: Goal: Risk for activity intolerance will decrease Outcome: Progressing   Problem: Nutrition: Goal: Adequate nutrition will be maintained Outcome: Progressing   Problem: Coping: Goal: Level of anxiety will decrease Outcome: Progressing   Problem: Elimination: Goal: Will not experience complications related to bowel motility Outcome: Progressing Goal: Will not experience complications related to urinary retention Outcome: Progressing   Problem: Pain Managment: Goal: General experience of comfort will improve Outcome: Progressing   Problem: Safety: Goal: Ability to remain free from injury will improve Outcome: Progressing   Problem: Skin Integrity: Goal: Risk for impaired skin integrity will decrease Outcome: Progressing   Problem: Education: Goal: Understanding of cardiac disease, CV risk reduction, and recovery process will improve Outcome: Progressing Goal: Individualized Educational Video(s) Outcome: Progressing   Problem: Activity: Goal: Ability to tolerate increased activity will improve Outcome: Progressing   Problem: Cardiac: Goal: Ability to achieve and maintain adequate cardiovascular perfusion will  improve Outcome: Progressing   Problem: Health Behavior/Discharge Planning: Goal: Ability to safely manage health-related needs after discharge will improve Outcome: Progressing   Problem: Education: Goal: Understanding of CV disease, CV risk reduction, and recovery process will improve Outcome: Progressing Goal: Individualized Educational Video(s) Outcome: Progressing   Problem: Activity: Goal: Ability to return to baseline activity level will improve Outcome: Progressing   Problem: Cardiovascular: Goal: Ability to achieve and maintain adequate cardiovascular perfusion will improve Outcome: Progressing Goal: Vascular access site(s) Level 0-1 will be maintained Outcome: Progressing   Problem: Health Behavior/Discharge Planning: Goal: Ability to safely manage health-related needs after discharge will improve Outcome: Progressing   Problem: Education: Goal: Knowledge of cardiac device and self-care will improve Outcome: Progressing Goal: Ability to safely manage health related needs after discharge will improve Outcome: Progressing Goal: Individualized Educational Video(s) Outcome: Progressing   Problem: Cardiac: Goal: Ability to achieve and maintain adequate cardiopulmonary perfusion will improve Outcome: Progressing

## 2021-09-16 NOTE — ED Notes (Signed)
Attempted to call report to [facility] [phone]

## 2021-09-16 NOTE — ED Notes (Signed)
Patient provided with toothbrush. Brushed her teeth independently.

## 2021-09-17 ENCOUNTER — Encounter (HOSPITAL_COMMUNITY): Payer: Self-pay | Admitting: Cardiology

## 2021-09-17 DIAGNOSIS — R778 Other specified abnormalities of plasma proteins: Secondary | ICD-10-CM

## 2021-09-17 DIAGNOSIS — I214 Non-ST elevation (NSTEMI) myocardial infarction: Secondary | ICD-10-CM | POA: Diagnosis not present

## 2021-09-17 LAB — BASIC METABOLIC PANEL
Anion gap: 12 (ref 5–15)
BUN: 15 mg/dL (ref 8–23)
CO2: 22 mmol/L (ref 22–32)
Calcium: 8.8 mg/dL — ABNORMAL LOW (ref 8.9–10.3)
Chloride: 102 mmol/L (ref 98–111)
Creatinine, Ser: 1.22 mg/dL — ABNORMAL HIGH (ref 0.44–1.00)
GFR, Estimated: 46 mL/min — ABNORMAL LOW (ref >60)
Glucose, Bld: 114 mg/dL — ABNORMAL HIGH (ref 70–99)
Potassium: 3.4 mmol/L — ABNORMAL LOW (ref 3.5–5.1)
Sodium: 136 mmol/L (ref 135–145)

## 2021-09-17 MED ORDER — POTASSIUM CHLORIDE CRYS ER 20 MEQ PO TBCR
40.0000 meq | EXTENDED_RELEASE_TABLET | Freq: Once | ORAL | Status: AC
  Administered 2021-09-17: 40 meq via ORAL
  Filled 2021-09-17: qty 2

## 2021-09-17 NOTE — Discharge Summary (Signed)
Physician Discharge Summary   Patient: [name] MRN: [id number] DOB: [birth year]  Admit date:     09/15/2021  Discharge date: 09/17/21  Discharge Physician: Marylu Lund   PCP: Carol Ada, MD   Recommendations at discharge:    Follow up with PCP in 1-2 weeks Follow up with Cardiology as scheduled Recommend removing facial stitches 9/10  Discharge Diagnoses: Principal Problem:   NSTEMI (non-ST elevated myocardial infarction) ([place]) Active Problems:   Convulsions/seizures ([place])   Hypertension   Chest pain   Hyperlipidemia   Hypothyroidism   GERD   Paroxysmal SVT (supraventricular tachycardia) ([facility])   Elevated troponin   Stage 3b chronic kidney disease (CKD) ([place])  Resolved Problems:   * No resolved hospital problems. *  Hospital Course: 76 y.o. female with medical history significant of HLD, hypothyroidism, anxiety/depression, GERD, HTN, epilepsy, SVT, CKD3a. Presenting with chest pain. She reports that 2 days ago she had a fall at home. She became dizzy after walking her dog. She lost her balance, fell, and hit her forehead. This resulted in a laceration of the right forehead. She denies LOC. She denies CP or palpitations prior to the fall. She came to the ED and had her laceration repaired. During that visit, it was noted that she had a mildly elevated troponin. It was recommended that she stay for further evaluation, but she left AMA. She reports since being home, she had intermittent episodes of midsternal chest pain. She called her cardiologist this morning when her symptoms did not improve and they recommended that she come to the ED for evaluation  Assessment and Plan: NSTEMI -Patient was transferred to [facility] - Cardiology and pt underwent heart cath 9/6 with no significant change in cath results from prior cath -Cardiology recs to continue medical therapy including ASA, bystolic, crestor -Heparin drip -Aspirin   History of SVT Hypertension -Cardizem, bystolic     Hyperlipidemia -Crestor   GERD -PPI   CKD stage IIIa -Stable   Hypothyroidism -Synthroid   Seizure disorder -Keppra   Facial laceration -After fall.  Laceration repair in ED 9/3.  Stitches should be removed 9/10        Consultants: Cardiology Procedures performed: Heart Cath  Disposition: Home Diet recommendation:  Cardiac diet DISCHARGE MEDICATION: Allergies as of 09/17/2021   No Known Allergies      Medication List     TAKE these medications    ALPRAZolam 0.25 MG tablet Commonly known as: XANAX Take 0.25 mg by mouth 2 (two) times daily.   aspirin EC 81 MG tablet Take 81 mg by mouth daily.   calcitRIOL 0.25 MCG capsule Commonly known as: ROCALTROL Take 0.25 mcg by mouth daily.   [employer] D3 50 MCG (2000 UT) Caps Generic drug: Cholecalciferol Take 2,000 Units by mouth daily.   diltiazem 180 MG 24 hr tablet Commonly known as: CARDIZEM LA Take 180 mg by mouth daily.   DULoxetine 60 MG capsule Commonly known as: CYMBALTA Take 60 mg by mouth daily.   ferrous sulfate 325 (65 FE) MG tablet Take 325 mg by mouth daily with breakfast.   levETIRAcetam 500 MG tablet Commonly known as: KEPPRA TAKE 1 TABLET BY MOUTH TWICE A DAY   levothyroxine 100 MCG tablet Commonly known as: SYNTHROID Take 100 mcg by mouth every morning.   magnesium oxide 400 (240 Mg) MG tablet Commonly known as: MAG-OX Take 400 mg by mouth 2 (two) times daily.   nebivolol 5 MG tablet Commonly known as: BYSTOLIC Take 2.5 mg by  mouth in the morning and at bedtime.   nitroGLYCERIN 0.4 MG SL tablet Commonly known as: NITROSTAT PLACE 1 TABLET (0.4 MG TOTAL) UNDER THE TONGUE EVERY 5 (FIVE) MINUTES AS NEEDED FOR CHEST PAIN.   pantoprazole 40 MG tablet Commonly known as: PROTONIX Take 1 tablet (40 mg total) by mouth 2 (two) times daily.   potassium chloride SA 20 MEQ tablet Commonly known as: KLOR-CON M Take 20 mEq by mouth 2 (two) times daily.   rosuvastatin 40 MG tablet Commonly  known as: CRESTOR Take 1 tablet (40 mg total) by mouth daily.        Follow-up Information     Carol Ada, MD Follow up in 2 week(s).   Specialty: Family Medicine Why: Hospital follow up Contact information: [place], [place] [address] [phone]         Sueanne Margarita, MD .   Specialty: Cardiology Contact information: [phone] [place] [address] [phone]                Discharge Exam: Danley Danker Weights   09/15/21 0953  Weight: 54.4 kg   General exam: Awake, laying in bed, in nad Respiratory system: Normal respiratory effort, no wheezing Cardiovascular system: regular rate, s1, s2 Gastrointestinal system: Soft, nondistended, positive BS Central nervous system: CN2-12 grossly intact, strength intact Extremities: Perfused, no clubbing Skin: Normal skin turgor, no notable skin lesions seen Psychiatry: Mood normal // no visual hallucinations   Condition at discharge: fair  The results of significant diagnostics from this hospitalization (including imaging, microbiology, ancillary and laboratory) are listed below for reference.   Imaging Studies: CARDIAC CATHETERIZATION  Result Date: 09/16/2021   2nd Diag lesion is 40% stenosed.   RPDA lesion is 40% stenosed.   Non-stenotic Mid LAD lesion was previously treated.   Non-stenotic Mid RCA to Dist RCA lesion was previously treated.   Non-stenotic RPAV lesion was previously treated.   LV end diastolic pressure is normal. Nonobstructive CAD. Prior stents are patent. No significant change compared to 2021. Normal LVEDP Plan: medical management.   ECHOCARDIOGRAM COMPLETE  Result Date: 09/16/2021    ECHOCARDIOGRAM REPORT   Patient Name:   [name] Date of Exam: 09/16/2021 Medical Rec #:  [id number]      Height:       59.0 in Accession #:    [id number]     Weight:       120.0 lb Date of Birth:  12/11/1945      BSA:          1.484 m Patient Age:    89 years       BP:            149/132 mmHg Patient Gender: F              HR:           61 bpm. Exam Location:  Inpatient Procedure: 2D Echo, Cardiac Doppler and Color Doppler Indications:    Elevated Troponin  History:        Patient has prior history of Echocardiogram examinations, most                 recent 03/12/2021. Previous Myocardial Infarction and CAD; Risk                 Factors:Hypertension, Dyslipidemia and GERD.  Sonographer:    Bernadene Person RDCS Referring Phys: [id number] [place]  1. Left ventricular  ejection fraction, by estimation, is 60 to 65%. The left ventricle has normal function. The left ventricle has no regional wall motion abnormalities. There is mild concentric left ventricular hypertrophy. Elevated left atrial pressure.  2. Right ventricular systolic function is normal. The right ventricular size is normal. There is normal pulmonary artery systolic pressure.  3. The mitral valve is normal in structure. No evidence of mitral valve regurgitation. No evidence of mitral stenosis. Moderate mitral annular calcification.  4. The aortic valve is tricuspid. Aortic valve regurgitation is trivial. Mild aortic valve stenosis. Aortic valve area, by VTI measures 1.34 cm. Aortic valve mean gradient measures 7.3 mmHg. Aortic valve Vmax measures 1.80 m/s.  5. The inferior vena cava is normal in size with greater than 50% respiratory variability, suggesting right atrial pressure of 3 mmHg. FINDINGS  Left Ventricle: Left ventricular ejection fraction, by estimation, is 60 to 65%. The left ventricle has normal function. The left ventricle has no regional wall motion abnormalities. The left ventricular internal cavity size was normal in size. There is  mild concentric left ventricular hypertrophy. Left ventricular diastolic function could not be evaluated due to mitral annular calcification (moderate or greater). Elevated left atrial pressure. Right Ventricle: The right ventricular size is normal. No increase in right  ventricular wall thickness. Right ventricular systolic function is normal. There is normal pulmonary artery systolic pressure. The tricuspid regurgitant velocity is 2.08 m/s, and  with an assumed right atrial pressure of 3 mmHg, the estimated right ventricular systolic pressure is 60.6 mmHg. Left Atrium: Left atrial size was normal in size. Right Atrium: Right atrial size was normal in size. Pericardium: There is no evidence of pericardial effusion. Mitral Valve: The mitral valve is normal in structure. Moderate mitral annular calcification. No evidence of mitral valve regurgitation. No evidence of mitral valve stenosis. Tricuspid Valve: The tricuspid valve is normal in structure. Tricuspid valve regurgitation is trivial. No evidence of tricuspid stenosis. Aortic Valve: The aortic valve is tricuspid. Aortic valve regurgitation is trivial. Mild aortic stenosis is present. Aortic valve mean gradient measures 7.3 mmHg. Aortic valve peak gradient measures 12.9 mmHg. Aortic valve area, by VTI measures 1.34 cm. Pulmonic Valve: The pulmonic valve was normal in structure. Pulmonic valve regurgitation is not visualized. No evidence of pulmonic stenosis. Aorta: The aortic root is normal in size and structure. Venous: The inferior vena cava is normal in size with greater than 50% respiratory variability, suggesting right atrial pressure of 3 mmHg. IAS/Shunts: No atrial level shunt detected by color flow Doppler.  LEFT VENTRICLE PLAX 2D LVIDd:         4.00 cm     Diastology LVIDs:         2.20 cm     LV e' medial:    3.50 cm/s LV PW:         1.00 cm     LV E/e' medial:  26.6 LV IVS:        1.00 cm     LV e' lateral:   4.60 cm/s LVOT diam:     1.80 cm     LV E/e' lateral: 20.2 LV SV:         57 LV SV Index:   38 LVOT Area:     2.54 cm  LV Volumes (MOD) LV vol d, MOD A2C: 47.5 ml LV vol d, MOD A4C: 43.4 ml LV vol s, MOD A2C: 16.9 ml LV vol s, MOD A4C: 13.7 ml LV SV MOD A2C:  30.6 ml LV SV MOD A4C:     43.4 ml LV SV MOD BP:       32.0 ml RIGHT VENTRICLE RV S prime:     13.20 cm/s TAPSE (M-mode): 1.9 cm LEFT ATRIUM             Index        RIGHT ATRIUM          Index LA diam:        3.60 cm 2.43 cm/m   RA Area:     9.48 cm LA Vol (A2C):   40.6 ml 27.35 ml/m  RA Volume:   19.30 ml 13.00 ml/m LA Vol (A4C):   40.4 ml 27.22 ml/m LA Biplane Vol: 43.1 ml 29.03 ml/m  AORTIC VALVE AV Area (Vmax):    1.40 cm AV Area (Vmean):   1.33 cm AV Area (VTI):     1.34 cm AV Vmax:           179.67 cm/s AV Vmean:          127.333 cm/s AV VTI:            0.421 m AV Peak Grad:      12.9 mmHg AV Mean Grad:      7.3 mmHg LVOT Vmax:         98.77 cm/s LVOT Vmean:        66.433 cm/s LVOT VTI:          0.223 m LVOT/AV VTI ratio: 0.53  AORTA Ao Root diam: 2.80 cm Ao Asc diam:  3.10 cm MITRAL VALVE                TRICUSPID VALVE MV Area (PHT): 2.87 cm     TR Peak grad:   17.3 mmHg MV Decel Time: 264 msec     TR Vmax:        208.00 cm/s MV E velocity: 93.00 cm/s MV A velocity: 133.00 cm/s  SHUNTS MV E/A ratio:  0.70         Systemic VTI:  0.22 m                             Systemic Diam: 1.80 cm Kardie Tobb DO Electronically signed by Berniece Salines DO Signature Date/Time: 09/16/2021/2:12:47 PM    Final    DG Chest 2 View  Result Date: 09/15/2021 CLINICAL DATA:  Chest pain beginning today, fell and struck head on Sunday EXAM: CHEST - 2 VIEW COMPARISON:  08/18/2017 FINDINGS: Normal heart size post median sternotomy. Mediastinal contours and pulmonary vascularity normal. Atherosclerotic calcification aorta. Chronic and elevation of RIGHT diaphragm and minimal lingular scarring. No acute infiltrate, pleural effusion, or pneumothorax. Bones demineralized. IMPRESSION: Chronic elevation of RIGHT diaphragm and minimal lingular scarring. No acute abnormalities. Aortic Atherosclerosis (ICD10-I70.0). Electronically Signed   By: Lavonia Dana M.D.   On: 09/15/2021 11:05   CT Head Wo Contrast  Result Date: 09/13/2021 CLINICAL DATA:  Patient reports that she lost her  balance while walking and fell, hitting the door. Patient does take Aspirin. Patient denies any blurred vision or nausea. Patient denies LOC. EXAM: CT HEAD WITHOUT CONTRAST CT CERVICAL SPINE WITHOUT CONTRAST TECHNIQUE: Multidetector CT imaging of the head and cervical spine was performed following the standard protocol without intravenous contrast. Multiplanar CT image reconstructions of the cervical spine were also generated. RADIATION DOSE REDUCTION: This exam was performed according to the departmental dose-optimization program which includes  automated exposure control, adjustment of the mA and/or kV according to patient size and/or use of iterative reconstruction technique. COMPARISON:  Head CT, 09/20/2017. FINDINGS: CT HEAD FINDINGS Brain: No evidence of acute infarction, hemorrhage, hydrocephalus, extra-axial collection or mass lesion/mass effect. Widened extra-axial space over the posterior left frontal lobe extending along the left sylvian fissure, unchanged. Patchy areas of white matter hypoattenuation bilaterally consistent with mild chronic microvascular ischemic change, also stable. Vascular: No hyperdense vessel or unexpected calcification. Skull: Normal. Negative for fracture or focal lesion. Sinuses/Orbits: Globes and orbits are unremarkable. Moderate right maxillary sinus mucosal thickening. Remaining visualized sinuses are clear. Other: Right frontal scalp laceration extending to the outer table of the frontal bone. CT CERVICAL SPINE FINDINGS Alignment: Straightened cervical lordosis.  No spondylolisthesis. Skull base and vertebrae: No acute fracture. No primary bone lesion or focal pathologic process. Soft tissues and spinal canal: No prevertebral fluid or swelling. No visible canal hematoma. Disc levels: Mild loss of disc height at C3-C4 with moderate to marked loss of disc height at C4-C5, C5-C6 and C6-C7. Mild disc bulging with endplate spurring. No convincing disc herniation. Facet degenerative  changes, greatest on the right at C2-C3 and C3-C4. Upper chest: No acute findings. Other: None. IMPRESSION: HEAD CT 1. No acute intracranial abnormalities. 2. Right frontal scalp laceration.  No underlying fracture. CERVICAL CT 1. No fracture or acute finding. Electronically Signed   By: Lajean Manes M.D.   On: 09/13/2021 10:20   CT Cervical Spine Wo Contrast  Result Date: 09/13/2021 CLINICAL DATA:  Patient reports that she lost her balance while walking and fell, hitting the door. Patient does take Aspirin. Patient denies any blurred vision or nausea. Patient denies LOC. EXAM: CT HEAD WITHOUT CONTRAST CT CERVICAL SPINE WITHOUT CONTRAST TECHNIQUE: Multidetector CT imaging of the head and cervical spine was performed following the standard protocol without intravenous contrast. Multiplanar CT image reconstructions of the cervical spine were also generated. RADIATION DOSE REDUCTION: This exam was performed according to the departmental dose-optimization program which includes automated exposure control, adjustment of the mA and/or kV according to patient size and/or use of iterative reconstruction technique. COMPARISON:  Head CT, 09/20/2017. FINDINGS: CT HEAD FINDINGS Brain: No evidence of acute infarction, hemorrhage, hydrocephalus, extra-axial collection or mass lesion/mass effect. Widened extra-axial space over the posterior left frontal lobe extending along the left sylvian fissure, unchanged. Patchy areas of white matter hypoattenuation bilaterally consistent with mild chronic microvascular ischemic change, also stable. Vascular: No hyperdense vessel or unexpected calcification. Skull: Normal. Negative for fracture or focal lesion. Sinuses/Orbits: Globes and orbits are unremarkable. Moderate right maxillary sinus mucosal thickening. Remaining visualized sinuses are clear. Other: Right frontal scalp laceration extending to the outer table of the frontal bone. CT CERVICAL SPINE FINDINGS Alignment: Straightened  cervical lordosis.  No spondylolisthesis. Skull base and vertebrae: No acute fracture. No primary bone lesion or focal pathologic process. Soft tissues and spinal canal: No prevertebral fluid or swelling. No visible canal hematoma. Disc levels: Mild loss of disc height at C3-C4 with moderate to marked loss of disc height at C4-C5, C5-C6 and C6-C7. Mild disc bulging with endplate spurring. No convincing disc herniation. Facet degenerative changes, greatest on the right at C2-C3 and C3-C4. Upper chest: No acute findings. Other: None. IMPRESSION: HEAD CT 1. No acute intracranial abnormalities. 2. Right frontal scalp laceration.  No underlying fracture. CERVICAL CT 1. No fracture or acute finding. Electronically Signed   By: Lajean Manes M.D.   On: 09/13/2021 10:20  Microbiology: Results for orders placed or performed during the hospital encounter of 09/10/19  SARS CORONAVIRUS 2 (TAT 6-24 HRS) Nasopharyngeal Nasopharyngeal Swab     Status: None   Collection Time: 09/10/19 12:46 PM   Specimen: Nasopharyngeal Swab  Result Value Ref Range Status   SARS Coronavirus 2 NEGATIVE NEGATIVE Final    Comment: (NOTE) SARS-CoV-2 target nucleic acids are NOT DETECTED.  The SARS-CoV-2 RNA is generally detectable in upper and lower respiratory specimens during the acute phase of infection. Negative results do not preclude SARS-CoV-2 infection, do not rule out co-infections with other pathogens, and should not be used as the sole basis for treatment or other patient management decisions. Negative results must be combined with clinical observations, patient history, and epidemiological information. The expected result is Negative.  Fact Sheet for Patients: SugarRoll.be  Fact Sheet for Healthcare Providers: https://www.woods-mathews.com/  This test is not yet approved or cleared by the [place] FDA and  has been authorized for detection and/or diagnosis of  SARS-CoV-2 by FDA under an Emergency Use Authorization (EUA). This EUA will remain  in effect (meaning this test can be used) for the duration of the COVID-19 declaration under Se ction 564(b)(1) of the Act, 21 U.S.C. section 360bbb-3(b)(1), unless the authorization is terminated or revoked sooner.  Performed at [facility], [place] [address], [place], [place] [address]     Labs: CBC: Recent Labs  Lab 09/13/21 1009 09/15/21 1126 09/16/21 0500 09/16/21 1739  WBC 11.6* 8.4 9.0 7.8  NEUTROABS 8.7*  --   --   --   HGB 12.9 13.5 12.6 13.4  HCT 40.6 43.4 40.3 41.3  MCV 89.6 90.2 90.2 87.5  PLT 269 285 294 833   Basic Metabolic Panel: Recent Labs  Lab 09/13/21 1009 09/15/21 1126 09/16/21 1739 09/17/21 0233  NA 143 140  --  136  K 3.8 4.3  --  3.4*  CL 113* 110  --  102  CO2 22 21*  --  22  GLUCOSE 116* 103*  --  114*  BUN 14 15  --  15  CREATININE 1.39* 1.12* 1.20* 1.22*  CALCIUM 9.8 9.6  --  8.8*   Liver Function Tests: No results for input(s): "AST", "ALT", "ALKPHOS", "BILITOT", "PROT", "ALBUMIN" in the last 168 hours. CBG: Recent Labs  Lab 09/13/21 1017  GLUCAP 114*    Discharge time spent: less than 30 minutes.  Signed: Marylu Lund, MD [facility] 09/17/2021

## 2021-09-17 NOTE — TOC Initial Note (Signed)
Transition of Care [facility]) - Initial/Assessment Note    Patient Details  Name: [name] MRN: [id number] Date of Birth: 11-04-1945  Transition of Care [facility]) CM/SW Contact:    Bethena Roys, RN Phone Number: 09/17/2021, 11:44 AM  Clinical Narrative: Patient presented post a fall at home. PTA patient was from home alone. Patient has support of her niece that checks on her during the week. Per niece, patient still drives to [place] every morning. Case Manager spoke with patient and niece; both are agreeable to home health services- no agency preference from family. [facility] is in network and they are agreeable to services. Referral submitted to Liaison and start of care to begin within 24-48 hours of transition home. Patient will have transportation home. Case Manager did discuss ALF as an option for this patient. Niece will look at [email or web address] for ratings on facilities. No further needs from Case Manager at this time.      Expected Discharge Plan: [place] Barriers to Discharge: No Barriers Identified   Patient Goals and CMS Choice Patient states their goals for this hospitalization and ongoing recovery are:: to retun home.   Choice offered to / list presented to : Patient (Niece as well)  Expected Discharge Plan and Services Expected Discharge Plan: [place] In-house Referral: NA Discharge Planning Services: CM Consult Post Acute Care Choice: [place] arrangements for the past 2 months: Single Family Home Expected Discharge Date: 09/17/21               DME Arranged: N/A DME Agency: NA       HH Arranged: PT HH Agency: [place] Date [place]: 09/17/21 Time HH Agency Contacted: 1130 Representative spoke with at [place]: [place] Arrangements/Services Living arrangements for the past 2 months: [place] with:: Self (Niece checks in on the patient during the  week) Patient language and need for interpreter reviewed:: Yes Do you feel safe going back to the place where you live?: Yes      Need for Family Participation in Patient Care: Yes (Comment) Care giver support system in place?: Yes (comment)   Criminal Activity/Legal Involvement Pertinent to Current Situation/Hospitalization: No - Comment as needed  Activities of Daily Living Home Assistive Devices/Equipment: Cane (specify quad or straight) ADL Screening (condition at time of admission) Patient's cognitive ability adequate to safely complete daily activities?: Yes Is the patient deaf or have difficulty hearing?: No Does the patient have difficulty seeing, even when wearing glasses/contacts?: No Does the patient have difficulty concentrating, remembering, or making decisions?: No Patient able to express need for assistance with ADLs?: Yes Does the patient have difficulty dressing or bathing?: Yes Independently performs ADLs?: Yes (appropriate for developmental age) Does the patient have difficulty walking or climbing stairs?: No Weakness of Legs: None Weakness of Arms/Hands: None  Permission Sought/Granted Permission sought to share information with : Facility Sport and exercise psychologist, Tourist information centre manager, Family Supports Permission granted to share information with : Yes, Verbal Permission Granted     Permission granted to share info w AGENCY: [facility]        Emotional Assessment Appearance:: Appears stated age Attitude/Demeanor/Rapport: Engaged Affect (typically observed): Appropriate Orientation: : Oriented to Self, Oriented to Place Alcohol / Substance Use: Not Applicable Psych Involvement: No (comment)  Admission diagnosis:  NSTEMI (non-ST elevated myocardial infarction) [facility]) [I21.4] Chest pain [R07.9] Patient Active Problem List   Diagnosis Date Noted   NSTEMI (non-ST  elevated myocardial infarction) ([place]) 09/16/2021   Elevated troponin 09/15/2021   Stage 3b chronic kidney  disease (CKD) ([place]) 09/15/2021   Partial seizure disorder (HCC)    DVT (deep venous thrombosis) ([place]) 08/19/2017   Orthostatic hypotension 08/18/2017   Diarrhea 08/18/2017   Mild aortic stenosis    Long-term current use of opiate analgesic 04/06/2017   Chronic low back pain 04/06/2017   Chronic neck pain 04/06/2017   Chronic pain syndrome 04/06/2017   Hyperphosphatemia 11/08/2016   Trigger finger, acquired 09/30/2015   Carpal tunnel syndrome, bilateral 07/31/2015   Abnormal CAT scan 05/04/2015   Breathlessness on exertion 05/04/2015   Abnormal chest CT 05/04/2015   Anemia in chronic kidney disease (CODE) 04/29/2015   Bone disease, metabolic 16/55/3748   Microscopic hematuria 04/29/2015   Avitaminosis D 04/29/2015   Normocytic anemia 03/01/2015   Syncope due to orthostatic hypotension 03/01/2015   Dehydration 03/01/2015   Hypomagnesemia 03/01/2015   Generalized weakness 02/23/2015   Hypokalemia 02/23/2015   AKI (acute kidney injury) ([place]) 02/23/2015   Acute bronchitis 02/23/2015   Hypocalcemia 02/23/2015   ILD (interstitial lung disease) ([place]) 01/24/2015   Chronic respiratory failure ([place]) 12/26/2014   Pulmonary fibrosis ([place]) 12/26/2014   Dyspnea and respiratory abnormality 11/22/2014   Abdominal pain 10/15/2014   Pain in the chest    RUQ abdominal pain    Shortness of breath 10/14/2014   Odynophagia    History of esophageal stricture    Esophageal dysmotility    Renal insufficiency 02/14/2014   Acute renal failure ([place]) 02/14/2014   Nephrolithiasis 02/13/2014   Gross hematuria 02/13/2014   Convulsions/seizures ([place]) 07/26/2013   Pericardial cyst    Mediastinal mass 02/05/2013   Cervical spondylosis 11/17/2011   Chest pain 11/16/2011   Hypertension    Hyperlipidemia    Hypothyroidism    History of  seizures    Depression    Carpal tunnel syndrome    Paroxysmal SVT (supraventricular tachycardia) ([place]) 01/02/2011   Coronary atherosclerosis    ANEMIA-UNSPECIFIED  12/12/2009   History of drug coated stent 12/01/2009   GERD 01/19/2008   IBS 01/19/2008   PCP:  Carol Ada, MD Pharmacy:   [id number]: [phone]: [phone]  Readmission Risk Interventions     No data to display

## 2021-09-17 NOTE — Evaluation (Signed)
Occupational Therapy Evaluation Patient Details Name: [name] MRN: [id number] DOB: November 02, 1945 Today's Date: 09/17/2021   History of Present Illness Pt is a 76 y/o female admitted secondary to worsening chest pain. Found to have NSTEMI and is s/p heart cath 9/6. Pt with recent visit to the ED after falling and hitting her head. PMH includes HTN, CKD, CAD, and seizures.   Clinical Impression   Patient admitted for the diagnosis above.  PTA she lives at home alone, and has help from her niece, and neighbors that check in on her.  Patient did sustain a recent fall, but otherwise is Mod I for ADL completion at a sit stand level, and typically walks around with out an AD.  No acute OT needs exist, and the patient is not interested in post acute rehab, but no HH OT is actually recommended, the patient is close to her baseline.          Recommendations for follow up therapy are one component of a multi-disciplinary discharge planning process, led by the attending physician.  Recommendations may be updated based on patient status, additional functional criteria and insurance authorization.   Follow Up Recommendations  No OT follow up    Assistance Recommended at Discharge PRN  Patient can return home with the following Assist for transportation    Functional Status Assessment  Patient has not had a recent decline in their functional status  Equipment Recommendations  None recommended by OT    Recommendations for Other Services       Precautions / Restrictions Precautions Precautions: Fall Restrictions Weight Bearing Restrictions: No      Mobility Bed Mobility               General bed mobility comments: sitting in the recliner    Transfers Overall transfer level: Needs assistance Equipment used: None Transfers: Sit to/from Stand Sit to Stand: Supervision                  Balance Overall balance assessment: Mild deficits observed, not formally tested                                          ADL either performed or assessed with clinical judgement   ADL Overall ADL's : At baseline                                             Vision Patient Visual Report: No change from baseline       Perception Perception Perception: Not tested   Praxis Praxis Praxis: Not tested    Pertinent Vitals/Pain Pain Assessment Pain Assessment: No/denies pain     Hand Dominance Left   Extremity/Trunk Assessment Upper Extremity Assessment Upper Extremity Assessment: Overall WFL for tasks assessed   Lower Extremity Assessment Lower Extremity Assessment: Defer to PT evaluation   Cervical / Trunk Assessment Cervical / Trunk Assessment: Kyphotic   Communication Communication Communication: No difficulties   Cognition Arousal/Alertness: Awake/alert Behavior During Therapy: WFL for tasks assessed/performed Overall Cognitive Status: No family/caregiver present to determine baseline cognitive functioning  General Comments   VSS    Exercises     Shoulder Instructions      Home Living Family/patient expects to be discharged to:: Private residence Living Arrangements: Alone Available Help at Discharge: Neighbor;Available PRN/intermittently;Family Type of Home: House Home Access: Stairs to enter [employer] of Steps: 4-5 Entrance Stairs-Rails: Right;Left Home Layout: One level     Bathroom Shower/Tub: Teacher, early years/pre: Standard Bathroom Accessibility: Yes How Accessible: Accessible via walker Home Equipment: [place] - single point          Prior Functioning/Environment Prior Level of Function : Independent/Modified Independent;Driving             Mobility Comments: Reports independence with mobility ADLs Comments: Still drives. Reports she normally sits in the tub to take a shower.  Also has neighbors that help.  she  will call a cab for community mobility        OT Problem List: Decreased activity tolerance      OT Treatment/Interventions:      OT Goals(Current goals can be found in the care plan section) Acute Rehab OT Goals Patient Stated Goal: Return home OT Goal Formulation: With patient Time For Goal Achievement: 09/21/21 Potential to Achieve Goals: Good  OT Frequency:      Co-evaluation              AM-PAC OT "6 Clicks" Daily Activity     Outcome Measure Help from another person eating meals?: None Help from another person taking care of personal grooming?: None Help from another person toileting, which includes using toliet, bedpan, or urinal?: None Help from another person bathing (including washing, rinsing, drying)?: None Help from another person to put on and taking off regular upper body clothing?: None Help from another person to put on and taking off regular lower body clothing?: None 6 Click Score: 24   End of Session Nurse Communication: Mobility status  Activity Tolerance: Patient tolerated treatment well Patient left: in chair;with call bell/phone within reach  OT Visit Diagnosis: Unsteadiness on feet (R26.81)                Time: 1005-1025 OT Time Calculation (min): 20 min Charges:  OT General Charges $OT Visit: 1 Visit OT Evaluation $OT Eval Moderate Complexity: 1 Mod  09/17/2021  RP, OTR/L  Acute Rehabilitation Services  Office:  [phone]   Metta Clines 09/17/2021, 10:30 AM

## 2021-09-17 NOTE — Progress Notes (Signed)
Mobility Specialist - Progress Note   09/17/21 0921  Mobility  Activity Ambulated with assistance in hallway  Level of Assistance Contact guard assist, steadying assist  Assistive Device None  Distance Ambulated (ft) 180 ft  Activity Response Tolerated well  $Mobility charge 1 Mobility    Pre-mobility: 68 HR,123/72 BP  Pt was received in bed and agreeable to mobility. No c/o pain throughout ambulation. Pt was returned to room with all needs meet and PT present.  Larey Seat

## 2021-09-17 NOTE — Evaluation (Signed)
Physical Therapy Evaluation Patient Details Name: [name] MRN: [id number] DOB: [birth year] Today's Date: 09/17/2021  History of Present Illness  Pt is a 76 y/o female admitted secondary to worsening chest pain. Found to have NSTEMI and is s/p heart cath 9/6. Pt with recent visit to the ED after falling and hitting her head. PMH includes HTN, CKD, CAD, and seizures.  Clinical Impression  Pt admitted secondary to problem above with deficits below. Pt requiring min guard A for mobility in room. Had just ambulated with mobility specialist and breakfast in the room, so further mobility deferred. Pt with mild unsteadiness. Educated about using cane at home to increase safety. Pt reports neighbors check on her often. Recommending HHPT at d/c to address deficits. Will continue to follow acutely.        Recommendations for follow up therapy are one component of a multi-disciplinary discharge planning process, led by the attending physician.  Recommendations may be updated based on patient status, additional functional criteria and insurance authorization.  Follow Up Recommendations Home health PT      Assistance Recommended at Discharge Intermittent Supervision/Assistance  Patient can return home with the following  A little help with bathing/dressing/bathroom;Assistance with cooking/housework;Assist for transportation;Help with stairs or ramp for entrance    Equipment Recommendations None recommended by PT  Recommendations for Other Services       Functional Status Assessment Patient has had a recent decline in their functional status and demonstrates the ability to make significant improvements in function in a reasonable and predictable amount of time.     Precautions / Restrictions Precautions Precautions: Fall Restrictions Weight Bearing Restrictions: No      Mobility  Bed Mobility               General bed mobility comments: At EOB with mobility specialist upon entry     Transfers Overall transfer level: Needs assistance Equipment used: None Transfers: Sit to/from Stand Sit to Stand: Min guard           General transfer comment: Min guard for safety.    Ambulation/Gait Ambulation/Gait assistance: Min guard Gait Distance (Feet): 15 Feet Assistive device: None Gait Pattern/deviations: Step-through pattern, Decreased stride length Gait velocity: Decreased     General Gait Details: Pt reaching to objects in the room for support with LUE. Pt had just ambulated with MS, so did not ambulate further. Educated about using cane for increased ambulation  Financial trader [place] (Stroke Patients Only)       Balance Overall balance assessment: Mild deficits observed, not formally tested                                           Pertinent Vitals/Pain Pain Assessment Pain Assessment: No/denies pain    Home Living Family/patient expects to be discharged to:: Private residence Living Arrangements: Alone Available Help at Discharge: Neighbor;Available PRN/intermittently;Family Type of Home: House Home Access: Stairs to enter Entrance Stairs-Rails: Psychiatric nurse of Steps: 4-5   Home Layout: One level Home Equipment: Cane - single point      Prior Function Prior Level of Function : Independent/Modified Independent;Driving             Mobility Comments: Reports independence with mobility ADLs Comments: Still drives. Reports she normally sits in the tub  to take a shower.     Hand Dominance        Extremity/Trunk Assessment   Upper Extremity Assessment Upper Extremity Assessment: Defer to OT evaluation    Lower Extremity Assessment Lower Extremity Assessment: Generalized weakness    Cervical / Trunk Assessment Cervical / Trunk Assessment: Kyphotic  Communication   Communication: No difficulties  Cognition Arousal/Alertness: Awake/alert Behavior  During Therapy: WFL for tasks assessed/performed Overall Cognitive Status: No family/caregiver present to determine baseline cognitive functioning                                          General Comments      Exercises     Assessment/Plan    PT Assessment Patient needs continued PT services  PT Problem List Decreased strength;Decreased activity tolerance;Decreased balance;Decreased mobility;Decreased knowledge of use of DME;Decreased knowledge of precautions       PT Treatment Interventions DME instruction;Gait training;Stair training;Functional mobility training;Therapeutic activities;Therapeutic exercise;Balance training;Patient/family education    PT Goals (Current goals can be found in the Care Plan section)  Acute Rehab PT Goals Patient Stated Goal: to go home PT Goal Formulation: With patient Time For Goal Achievement: 10/01/21 Potential to Achieve Goals: Good    Frequency Min 3X/week     Co-evaluation               AM-PAC PT "6 Clicks" Mobility  Outcome Measure Help needed turning from your back to your side while in a flat bed without using bedrails?: A Little Help needed moving from lying on your back to sitting on the side of a flat bed without using bedrails?: A Little Help needed moving to and from a bed to a chair (including a wheelchair)?: A Little Help needed standing up from a chair using your arms (e.g., wheelchair or bedside chair)?: A Little Help needed to walk in hospital room?: A Little Help needed climbing 3-5 steps with a railing? : A Little 6 Click Score: 18    End of Session   Activity Tolerance: Patient tolerated treatment well Patient left: in chair;with call bell/phone within reach Nurse Communication: Mobility status PT Visit Diagnosis: Unsteadiness on feet (R26.81);Muscle weakness (generalized) (M62.81)    Time: 1610-9604 PT Time Calculation (min) (ACUTE ONLY): 10 min   Charges:   PT Evaluation $PT Eval Low  Complexity: 1 Low          Reuel Derby, PT, DPT  Acute Rehabilitation Services  Office: [phone]   Rudean Hitt 09/17/2021, 9:34 AM

## 2021-09-17 NOTE — Progress Notes (Signed)
After administering pt medication this AM, pt got up to ambulate to the bathroom with one assist. A green and blue pill was seen on the floor. Pt was observed taking medication this AM and it was not seen if pt dropped her medication or not and pt does not believe that she dropped this pill this AM. Paged internal medicine to determine if need to try to give med or hold.

## 2021-09-17 NOTE — Progress Notes (Signed)
Rounding Note    Patient Name: [name] Date of Encounter: 09/17/2021  [place] Cardiologist: Fransico Him, MD   Subjective   Feeling better this morning.  We discussed her catheterization results.  Reassurance.  Same as in 2021.  Inpatient Medications    Scheduled Meds:  ALPRAZolam  0.25 mg Oral BID   aspirin EC  81 mg Oral Daily   calcitRIOL  0.25 mcg Oral Daily   diltiazem  180 mg Oral Daily   DULoxetine  60 mg Oral Daily   heparin  5,000 Units Subcutaneous Q8H   levETIRAcetam  500 mg Oral BID   levothyroxine  100 mcg Oral q morning   nebivolol  2.5 mg Oral BID   pantoprazole  40 mg Oral BID   rosuvastatin  40 mg Oral Daily   sodium chloride flush  3 mL Intravenous Q12H   sodium chloride flush  3 mL Intravenous Q12H   Continuous Infusions:  sodium chloride     PRN Meds: sodium chloride, acetaminophen, ondansetron (ZOFRAN) IV, mouth rinse, sodium chloride flush   Vital Signs    Vitals:   09/16/21 1906 09/16/21 2006 09/17/21 0626 09/17/21 0832  BP: 133/63 125/63 (!) 153/66 (!) 151/86  Pulse: 88 81 65   Resp: '18 18 16   '$ Temp:  98.2 F (36.8 C) 98.2 F (36.8 C)   TempSrc:  Oral Oral   SpO2: 98% 98% 98%   Weight:      Height:        Intake/Output Summary (Last 24 hours) at 09/17/2021 0946 Last data filed at 09/17/2021 0300 Gross per 24 hour  Intake 823.09 ml  Output --  Net 823.09 ml      09/15/2021    9:53 AM 03/11/2021   11:30 AM 02/02/2021   10:01 AM  Last 3 Weights  Weight (lbs) 120 lb 147 lb 8 oz 149 lb 3.2 oz  Weight (kg) 54.432 kg 66.906 kg 67.677 kg      Telemetry    No adverse arrhythmias- Personally Reviewed  ECG    No new- Personally Reviewed  Physical Exam   GEN: No acute distress.  Forehead laceration Neck: No JVD Cardiac: RRR, 2/6 systolic murmur,no rubs, or gallops.  Cath site clean dry and intact Respiratory: Clear to auscultation bilaterally. GI: Soft, nontender, non-distended  MS: No edema; No deformity.   Cath site clean dry and intact Neuro:  Nonfocal  Psych: Normal affect   Labs    High Sensitivity Troponin:   Recent Labs  Lab 09/15/21 1126 09/15/21 1449 09/15/21 2247 09/16/21 0841 09/16/21 1137  TROPONINIHS 450* 424* 483*  469* 399* 318*     Chemistry Recent Labs  Lab 09/13/21 1009 09/15/21 1126 09/16/21 1739 09/17/21 0233  NA 143 140  --  136  K 3.8 4.3  --  3.4*  CL 113* 110  --  102  CO2 22 21*  --  22  GLUCOSE 116* 103*  --  114*  BUN 14 15  --  15  CREATININE 1.39* 1.12* 1.20* 1.22*  CALCIUM 9.8 9.6  --  8.8*  GFRNONAA 40* 51* 47* 46*  ANIONGAP 8 9  --  12    Lipids  Recent Labs  Lab 09/16/21 0500  CHOL 138  TRIG 110  HDL 56  LDLCALC 60  CHOLHDL 2.5    Hematology Recent Labs  Lab 09/15/21 1126 09/16/21 0500 09/16/21 1739  WBC 8.4 9.0 7.8  RBC 4.81 4.47 4.72  HGB 13.5 12.6 13.4  HCT 43.4 40.3 41.3  MCV 90.2 90.2 87.5  MCH 28.1 28.2 28.4  MCHC 31.1 31.3 32.4  RDW 13.1 12.9 12.7  PLT 285 294 285   Thyroid No results for input(s): "TSH", "FREET4" in the last 168 hours.  BNPNo results for input(s): "BNP", "PROBNP" in the last 168 hours.  DDimer No results for input(s): "DDIMER" in the last 168 hours.   Radiology    CARDIAC CATHETERIZATION  Result Date: 09/16/2021   2nd Diag lesion is 40% stenosed.   RPDA lesion is 40% stenosed.   Non-stenotic Mid LAD lesion was previously treated.   Non-stenotic Mid RCA to Dist RCA lesion was previously treated.   Non-stenotic RPAV lesion was previously treated.   LV end diastolic pressure is normal. Nonobstructive CAD. Prior stents are patent. No significant change compared to 2021. Normal LVEDP Plan: medical management.   ECHOCARDIOGRAM COMPLETE  Result Date: 09/16/2021    ECHOCARDIOGRAM REPORT   Patient Name:   [name] Date of Exam: 09/16/2021 Medical Rec #:  [id number]      Height:       59.0 in Accession #:    [id number]     Weight:       120.0 lb Date of Birth:  08/17/1945      BSA:          1.484 m  Patient Age:    76 years       BP:           149/132 mmHg Patient Gender: F              HR:           61 bpm. Exam Location:  Inpatient Procedure: 2D Echo, Cardiac Doppler and Color Doppler Indications:    Elevated Troponin  History:        Patient has prior history of Echocardiogram examinations, most                 recent 03/12/2021. Previous Myocardial Infarction and CAD; Risk                 Factors:Hypertension, Dyslipidemia and GERD.  Sonographer:    Bernadene Person RDCS Referring Phys: [id number] [place]  1. Left ventricular ejection fraction, by estimation, is 60 to 65%. The left ventricle has normal function. The left ventricle has no regional wall motion abnormalities. There is mild concentric left ventricular hypertrophy. Elevated left atrial pressure.  2. Right ventricular systolic function is normal. The right ventricular size is normal. There is normal pulmonary artery systolic pressure.  3. The mitral valve is normal in structure. No evidence of mitral valve regurgitation. No evidence of mitral stenosis. Moderate mitral annular calcification.  4. The aortic valve is tricuspid. Aortic valve regurgitation is trivial. Mild aortic valve stenosis. Aortic valve area, by VTI measures 1.34 cm. Aortic valve mean gradient measures 7.3 mmHg. Aortic valve Vmax measures 1.80 m/s.  5. The inferior vena cava is normal in size with greater than 50% respiratory variability, suggesting right atrial pressure of 3 mmHg. FINDINGS  Left Ventricle: Left ventricular ejection fraction, by estimation, is 60 to 65%. The left ventricle has normal function. The left ventricle has no regional wall motion abnormalities. The left ventricular internal cavity size was normal in size. There is  mild concentric left ventricular hypertrophy. Left ventricular diastolic function could not be evaluated due to mitral annular calcification (moderate or greater). Elevated left atrial pressure. Right  Ventricle: The right  ventricular size is normal. No increase in right ventricular wall thickness. Right ventricular systolic function is normal. There is normal pulmonary artery systolic pressure. The tricuspid regurgitant velocity is 2.08 m/s, and  with an assumed right atrial pressure of 3 mmHg, the estimated right ventricular systolic pressure is 26.4 mmHg. Left Atrium: Left atrial size was normal in size. Right Atrium: Right atrial size was normal in size. Pericardium: There is no evidence of pericardial effusion. Mitral Valve: The mitral valve is normal in structure. Moderate mitral annular calcification. No evidence of mitral valve regurgitation. No evidence of mitral valve stenosis. Tricuspid Valve: The tricuspid valve is normal in structure. Tricuspid valve regurgitation is trivial. No evidence of tricuspid stenosis. Aortic Valve: The aortic valve is tricuspid. Aortic valve regurgitation is trivial. Mild aortic stenosis is present. Aortic valve mean gradient measures 7.3 mmHg. Aortic valve peak gradient measures 12.9 mmHg. Aortic valve area, by VTI measures 1.34 cm. Pulmonic Valve: The pulmonic valve was normal in structure. Pulmonic valve regurgitation is not visualized. No evidence of pulmonic stenosis. Aorta: The aortic root is normal in size and structure. Venous: The inferior vena cava is normal in size with greater than 50% respiratory variability, suggesting right atrial pressure of 3 mmHg. IAS/Shunts: No atrial level shunt detected by color flow Doppler.  LEFT VENTRICLE PLAX 2D LVIDd:         4.00 cm     Diastology LVIDs:         2.20 cm     LV e' medial:    3.50 cm/s LV PW:         1.00 cm     LV E/e' medial:  26.6 LV IVS:        1.00 cm     LV e' lateral:   4.60 cm/s LVOT diam:     1.80 cm     LV E/e' lateral: 20.2 LV SV:         57 LV SV Index:   38 LVOT Area:     2.54 cm  LV Volumes (MOD) LV vol d, MOD A2C: 47.5 ml LV vol d, MOD A4C: 43.4 ml LV vol s, MOD A2C: 16.9 ml LV vol s, MOD A4C: 13.7 ml LV SV MOD A2C:      30.6 ml LV SV MOD A4C:     43.4 ml LV SV MOD BP:      32.0 ml RIGHT VENTRICLE RV S prime:     13.20 cm/s TAPSE (M-mode): 1.9 cm LEFT ATRIUM             Index        RIGHT ATRIUM          Index LA diam:        3.60 cm 2.43 cm/m   RA Area:     9.48 cm LA Vol (A2C):   40.6 ml 27.35 ml/m  RA Volume:   19.30 ml 13.00 ml/m LA Vol (A4C):   40.4 ml 27.22 ml/m LA Biplane Vol: 43.1 ml 29.03 ml/m  AORTIC VALVE AV Area (Vmax):    1.40 cm AV Area (Vmean):   1.33 cm AV Area (VTI):     1.34 cm AV Vmax:           179.67 cm/s AV Vmean:          127.333 cm/s AV VTI:            0.421 m AV Peak Grad:  12.9 mmHg AV Mean Grad:      7.3 mmHg LVOT Vmax:         98.77 cm/s LVOT Vmean:        66.433 cm/s LVOT VTI:          0.223 m LVOT/AV VTI ratio: 0.53  AORTA Ao Root diam: 2.80 cm Ao Asc diam:  3.10 cm MITRAL VALVE                TRICUSPID VALVE MV Area (PHT): 2.87 cm     TR Peak grad:   17.3 mmHg MV Decel Time: 264 msec     TR Vmax:        208.00 cm/s MV E velocity: 93.00 cm/s MV A velocity: 133.00 cm/s  SHUNTS MV E/A ratio:  0.70         Systemic VTI:  0.22 m                             Systemic Diam: 1.80 cm Kardie Tobb DO Electronically signed by Berniece Salines DO Signature Date/Time: 09/16/2021/2:12:47 PM    Final    DG Chest 2 View  Result Date: 09/15/2021 CLINICAL DATA:  Chest pain beginning today, fell and struck head on 'Sunday EXAM: CHEST - 2 VIEW COMPARISON:  08/18/2017 FINDINGS: Normal heart size post median sternotomy. Mediastinal contours and pulmonary vascularity normal. Atherosclerotic calcification aorta. Chronic and elevation of RIGHT diaphragm and minimal lingular scarring. No acute infiltrate, pleural effusion, or pneumothorax. Bones demineralized. IMPRESSION: Chronic elevation of RIGHT diaphragm and minimal lingular scarring. No acute abnormalities. Aortic Atherosclerosis (ICD10-I70.0). Electronically Signed   By: Kasen Sako  Boles M.D.   On: 09/15/2021 11:05     Patient Profile     76'$  y.o. female with patent  stents, no significant change in cardiac catheterization  Assessment & Plan    Coronary artery disease/non-ST elevation myocardial infarction - Patent stents.  Continue with medical management.  Has had longstanding dilemma of noncardiac/cardiac chest pain. -Continue with goal-directed medical therapy which includes aspirin Bystolic Crestor. -Has had esophageal spasm type symptoms in the past. -Mildly elevated troponin does portend overall worsened prognosis. -Not a candidate for cardiac rehab. -LDL less than 70.  At goal.  Mild aortic stenosis - Stable on echocardiogram.  Normal pump function.  Murmur heard on exam.  Forehead laceration -Stitches in place.  Per primary team.   Okay with discharge.  Follow-up as previously scheduled with cardiology.  For questions or updates, please contact [place] Please consult [email or web address] for contact info under        Signed, Candee Furbish, MD  09/17/2021, 9:46 AM

## 2021-09-18 LAB — LIPOPROTEIN A (LPA): Lipoprotein (a): 204.1 nmol/L — ABNORMAL HIGH (ref <75.0)

## 2021-09-21 DIAGNOSIS — I25110 Atherosclerotic heart disease of native coronary artery with unstable angina pectoris: Secondary | ICD-10-CM | POA: Diagnosis not present

## 2021-09-21 DIAGNOSIS — W19XXXD Unspecified fall, subsequent encounter: Secondary | ICD-10-CM | POA: Diagnosis not present

## 2021-09-21 DIAGNOSIS — K219 Gastro-esophageal reflux disease without esophagitis: Secondary | ICD-10-CM | POA: Diagnosis not present

## 2021-09-21 DIAGNOSIS — G40109 Localization-related (focal) (partial) symptomatic epilepsy and epileptic syndromes with simple partial seizures, not intractable, without status epilepticus: Secondary | ICD-10-CM | POA: Diagnosis not present

## 2021-09-21 DIAGNOSIS — M1990 Unspecified osteoarthritis, unspecified site: Secondary | ICD-10-CM | POA: Diagnosis not present

## 2021-09-21 DIAGNOSIS — N1831 Chronic kidney disease, stage 3a: Secondary | ICD-10-CM | POA: Diagnosis not present

## 2021-09-21 DIAGNOSIS — S0181XD Laceration without foreign body of other part of head, subsequent encounter: Secondary | ICD-10-CM | POA: Diagnosis not present

## 2021-09-21 DIAGNOSIS — M5459 Other low back pain: Secondary | ICD-10-CM | POA: Diagnosis not present

## 2021-09-21 DIAGNOSIS — I119 Hypertensive heart disease without heart failure: Secondary | ICD-10-CM | POA: Diagnosis not present

## 2021-09-21 DIAGNOSIS — J8410 Pulmonary fibrosis, unspecified: Secondary | ICD-10-CM | POA: Diagnosis not present

## 2021-09-22 DIAGNOSIS — E7800 Pure hypercholesterolemia, unspecified: Secondary | ICD-10-CM | POA: Diagnosis not present

## 2021-09-22 DIAGNOSIS — I10 Essential (primary) hypertension: Secondary | ICD-10-CM | POA: Diagnosis not present

## 2021-09-22 DIAGNOSIS — I2510 Atherosclerotic heart disease of native coronary artery without angina pectoris: Secondary | ICD-10-CM | POA: Diagnosis not present

## 2021-09-23 NOTE — Progress Notes (Signed)
Cardiology Office Note:    Date:  09/30/2021   ID:  [name], DOB 1945/08/01, MRN [id number]  PCP:  Carol Ada, [place] [facility] Cardiologist:  Fransico Him, MD     Referring MD: Carol Ada, MD   Chief Complaint:  Hospitalization Follow-up     History of Present Illness:   [name] is a 76 y.o. female with a hx of coronary artery disease, chronic noncardiac chest pain, hypertension, SVT, pericardial cyst s/p resection, hyperlipidemia, seizure, mild aortic stenosis and prior history of orthostatic hypotension     Hx of CAD: -s/p inf-post MI 2011 s/p DES to RCA.  -recurrent CP s/p DES to RCA 06/2010  -recurrent CP s/p  DES to LAD 2014 with residual dz treated medically.   -recurrent CP 08/2018 with no ischemia on nuclear stress test 09/2018 -Last cardiac cath 09/2019 showed patent stents and 40% RPDA and Diagonal>> medical management.  Recommended femoral approach for future PCI given small radial artery and torturous right subclavian.    Non cardiac chest pain:  long history due to esopohageal spasm  Patient was seen 09/16/2021 for the evaluation of elevated troponin/NSTEMI, cath with patent stents, medical management recommended.   Patient comes in with her sister in law. She lost her husband 3 months ago. No further chest pain. Someone told her she can take a NTG for esophageal spasm. She hasn't had to take any. Has one more week of PT. Drinking primarily cheer wine. Has lost weight since her husband diet.       Past Medical History:  Diagnosis Date   Anxiety    Arthritis    Blood transfusion without reported diagnosis    childhood   Carpal tunnel syndrome, bilateral 07/31/2015   Cataract    Cervical spondylosis without myelopathy    Chronic chest pain    Chronic kidney disease    Chronic low back pain    Chronotropic incompetence    CKD (chronic kidney disease), stage III (HCC)    Coronary artery disease    a. inf-post MI 2011 s/p DES to  RCA. b. DES to RCA 06/2010. c. DES to LAD 2014, residual dz treated medically.   Depression    DVT (deep venous thrombosis) (HCC)    left   Esophageal stricture    GERD (gastroesophageal reflux disease)    Gross hematuria    Heart murmur    History of esophageal dilatation    FOR STRIUCTURE   History of gout    History of hiatal hernia    History of kidney stones    Hyperlipidemia    Hypertension    Hypothyroidism    Internal hemorrhoids    Irritable bowel syndrome    Mild aortic stenosis    by echo 2018   Myocardial infarction ([place])    Obesity    Orthostatic hypotension    Partial seizure disorder (HCC) NEUROLOGIST-- DR Jannifer Franklin   NOCTURNAL   PONV (postoperative nausea and vomiting)    hard time getting iv site-had to do neck stick 2 yrs ago   Pulmonary fibrosis (HCC)    S/P pericardial cyst excision    02-05-2013  benign   Seizures ([place])    none in yrs   SVT (supraventricular tachycardia) (HCC)    Trigger finger, acquired 09/30/2015   Right middle finger   Vitamin D deficiency    Current Medications: Current Meds  Medication Sig   ALPRAZolam (XANAX) 0.25 MG tablet Take 0.25 mg  by mouth 2 (two) times daily.    aspirin EC 81 MG tablet Take 81 mg by mouth daily.   calcitRIOL (ROCALTROL) 0.25 MCG capsule Take 0.25 mcg by mouth daily.   [employer] D3 50 MCG (2000 UT) CAPS Take 2,000 Units by mouth daily.   diltiazem (CARDIZEM LA) 180 MG 24 hr tablet Take 180 mg by mouth daily.   DULoxetine (CYMBALTA) 60 MG capsule Take 60 mg by mouth daily.   ferrous sulfate 325 (65 FE) MG tablet Take 325 mg by mouth daily with breakfast.   levETIRAcetam (KEPPRA) 500 MG tablet TAKE 1 TABLET BY MOUTH TWICE A DAY   levothyroxine (SYNTHROID) 100 MCG tablet Take 100 mcg by mouth every morning.   magnesium oxide (MAG-OX) 400 (240 Mg) MG tablet Take 400 mg by mouth 2 (two) times daily.   nebivolol (BYSTOLIC) 5 MG tablet Take 2.5 mg by mouth in the morning and at bedtime.   nitroGLYCERIN (NITROSTAT) 0.4  MG SL tablet PLACE 1 TABLET (0.4 MG TOTAL) UNDER THE TONGUE EVERY 5 (FIVE) MINUTES AS NEEDED FOR CHEST PAIN.   pantoprazole (PROTONIX) 40 MG tablet Take 1 tablet (40 mg total) by mouth 2 (two) times daily.   potassium chloride SA (K-DUR,KLOR-CON) 20 MEQ tablet Take 20 mEq by mouth 2 (two) times daily.    rosuvastatin (CRESTOR) 40 MG tablet Take 1 tablet (40 mg total) by mouth daily.    Allergies:   Patient has no known allergies.   Social History   Tobacco Use   Smoking status: Former    Packs/day: 0.30    Years: 3.00    Total pack years: 0.90    Types: Cigarettes    Start date: 11/08/1964    Quit date: 11/09/1967    Years since quitting: 53.9   Smokeless tobacco: Never  Vaping Use   Vaping Use: Never used  Substance Use Topics   Alcohol use: No    Alcohol/week: 0.0 standard drinks of alcohol   Drug use: No    Family Hx: The patient's family history includes Breast cancer in her niece and sister; CVA in her mother; Coronary artery disease in her father; Diabetes Mellitus I in her father; Emphysema in her father; Heart attack in her mother; Heart disease in her father; Hypertension in her father. There is no history of Colon cancer, Stomach cancer, Esophageal cancer, Rectal cancer, or Seizures.  ROS   EKGs/Labs/Other Test Reviewed:    EKG:  EKG is  not ordered today.     Recent Labs: 03/12/2021: ALT 8 09/16/2021: Hemoglobin 13.4; Platelets 285 09/17/2021: BUN 15; Creatinine, Ser 1.22; Potassium 3.4; Sodium 136   Recent Lipid Panel Recent Labs    09/16/21 0500  CHOL 138  TRIG 110  HDL 56  VLDL 22  LDLCALC 60     Prior CV Studies: LEFT HEART CATH AND CORONARY ANGIOGRAPHY 09/16/2021  Narrative   2nd Diag lesion is 40% stenosed.   RPDA lesion is 40% stenosed.   Non-stenotic Mid LAD lesion was previously treated.   Non-stenotic Mid RCA to Dist RCA lesion was previously treated.   Non-stenotic RPAV lesion was previously treated.   LV end diastolic pressure is  normal.  Nonobstructive CAD. Prior stents are patent. No significant change compared to 2021. Normal LVEDP  Plan: medical management.   ECHO COMPLETE WO IMAGING ENHANCING AGENT 09/16/2021  Narrative ECHOCARDIOGRAM REPORT    Patient Name:   [name] Date of Exam: 09/16/2021 Medical Rec #:  [id number]  Height:       59.0 in Accession #:    [id number]     Weight:       120.0 lb Date of Birth:  10-Nov-1945      BSA:          1.484 m Patient Age:    89 years       BP:           149/132 mmHg Patient Gender: F              HR:           61 bpm. Exam Location:  Inpatient  Procedure: 2D Echo, Cardiac Doppler and Color Doppler  Indications:    Elevated Troponin  History:        Patient has prior history of Echocardiogram examinations, most recent 03/12/2021. Previous Myocardial Infarction and CAD; Risk Factors:Hypertension, Dyslipidemia and GERD.  Sonographer:    Bernadene Person RDCS Referring Phys: [id number] [place]   1. Left ventricular ejection fraction, by estimation, is 60 to 65%. The left ventricle has normal function. The left ventricle has no regional wall motion abnormalities. There is mild concentric left ventricular hypertrophy. Elevated left atrial pressure. 2. Right ventricular systolic function is normal. The right ventricular size is normal. There is normal pulmonary artery systolic pressure. 3. The mitral valve is normal in structure. No evidence of mitral valve regurgitation. No evidence of mitral stenosis. Moderate mitral annular calcification. 4. The aortic valve is tricuspid. Aortic valve regurgitation is trivial. Mild aortic valve stenosis. Aortic valve area, by VTI measures 1.34 cm. Aortic valve mean gradient measures 7.3 mmHg. Aortic valve Vmax measures 1.80 m/s. 5. The inferior vena cava is normal in size with greater than 50% respiratory variability, suggesting right atrial pressure of 3 mmHg.  FINDINGS Left Ventricle: Left ventricular  ejection fraction, by estimation, is 60 to 65%. The left ventricle has normal function. The left ventricle has no regional wall motion abnormalities. The left ventricular internal cavity size was normal in size. There is mild concentric left ventricular hypertrophy. Left ventricular diastolic function could not be evaluated due to mitral annular calcification (moderate or greater). Elevated left atrial pressure.  Right Ventricle: The right ventricular size is normal. No increase in right ventricular wall thickness. Right ventricular systolic function is normal. There is normal pulmonary artery systolic pressure. The tricuspid regurgitant velocity is 2.08 m/s, and with an assumed right atrial pressure of 3 mmHg, the estimated right ventricular systolic pressure is 10.6 mmHg.  Left Atrium: Left atrial size was normal in size.  Right Atrium: Right atrial size was normal in size.  Pericardium: There is no evidence of pericardial effusion.  Mitral Valve: The mitral valve is normal in structure. Moderate mitral annular calcification. No evidence of mitral valve regurgitation. No evidence of mitral valve stenosis.  Tricuspid Valve: The tricuspid valve is normal in structure. Tricuspid valve regurgitation is trivial. No evidence of tricuspid stenosis.  Aortic Valve: The aortic valve is tricuspid. Aortic valve regurgitation is trivial. Mild aortic stenosis is present. Aortic valve mean gradient measures 7.3 mmHg. Aortic valve peak gradient measures 12.9 mmHg. Aortic valve area, by VTI measures 1.34 cm.  Pulmonic Valve: The pulmonic valve was normal in structure. Pulmonic valve regurgitation is not visualized. No evidence of pulmonic stenosis.  Aorta: The aortic root is normal in size and structure.  Venous: The inferior vena cava is normal in size with greater than 50% respiratory variability, suggesting right atrial pressure of  3 mmHg.  IAS/Shunts: No atrial level shunt detected by color flow  Doppler.   LEFT VENTRICLE PLAX 2D LVIDd:         4.00 cm     Diastology LVIDs:         2.20 cm     LV e' medial:    3.50 cm/s LV PW:         1.00 cm     LV E/e' medial:  26.6 LV IVS:        1.00 cm     LV e' lateral:   4.60 cm/s LVOT diam:     1.80 cm     LV E/e' lateral: 20.2 LV SV:         57 LV SV Index:   38 LVOT Area:     2.54 cm  LV Volumes (MOD) LV vol d, MOD A2C: 47.5 ml LV vol d, MOD A4C: 43.4 ml LV vol s, MOD A2C: 16.9 ml LV vol s, MOD A4C: 13.7 ml LV SV MOD A2C:     30.6 ml LV SV MOD A4C:     43.4 ml LV SV MOD BP:      32.0 ml  RIGHT VENTRICLE RV S prime:     13.20 cm/s TAPSE (M-mode): 1.9 cm  LEFT ATRIUM             Index        RIGHT ATRIUM          Index LA diam:        3.60 cm 2.43 cm/m   RA Area:     9.48 cm LA Vol (A2C):   40.6 ml 27.35 ml/m  RA Volume:   19.30 ml 13.00 ml/m LA Vol (A4C):   40.4 ml 27.22 ml/m LA Biplane Vol: 43.1 ml 29.03 ml/m AORTIC VALVE AV Area (Vmax):    1.40 cm AV Area (Vmean):   1.33 cm AV Area (VTI):     1.34 cm AV Vmax:           179.67 cm/s AV Vmean:          127.333 cm/s AV VTI:            0.421 m AV Peak Grad:      12.9 mmHg AV Mean Grad:      7.3 mmHg LVOT Vmax:         98.77 cm/s LVOT Vmean:        66.433 cm/s LVOT VTI:          0.223 m LVOT/AV VTI ratio: 0.53  AORTA Ao Root diam: 2.80 cm Ao Asc diam:  3.10 cm  MITRAL VALVE                TRICUSPID VALVE MV Area (PHT): 2.87 cm     TR Peak grad:   17.3 mmHg MV Decel Time: 264 msec     TR Vmax:        208.00 cm/s MV E velocity: 93.00 cm/s MV A velocity: 133.00 cm/s  SHUNTS MV E/A ratio:  0.70         Systemic VTI:  0.22 m Systemic Diam: 1.80 cm  Kardie Tobb DO Electronically signed by Berniece Salines DO Signature Date/Time: 09/16/2021/2:12:47 PM    Final         Risk Assessment/Calculations/Metrics:              Physical Exam:    VS:  BP 128/72   Pulse 61  Ht '4\' 11"'$  (1.499 m)   Wt 124 lb (56.2 kg)   SpO2 98%   BMI 25.04 kg/m     Wt  Readings from Last 3 Encounters:  09/30/21 124 lb (56.2 kg)  09/15/21 120 lb (54.4 kg)  03/11/21 147 lb 8 oz (66.9 kg)    Physical Exam  GEN: Well nourished, well developed, in no acute distress  Neck: no JVD, carotid bruits, or masses Cardiac:RRR; no murmurs, rubs, or gallops  Respiratory:  clear to auscultation bilaterally, normal work of breathing GI: soft, nontender, nondistended, + BS Ext: without cyanosis, clubbing, or edema, Good distal pulses bilaterally Neuro:  Alert and Oriented x 3,  Psych: euthymic mood, full affect       ASSESSMENT & PLAN:   No problem-specific Assessment & Plan notes found for this encounter.   CAD NSTEMI 09/2021 cath patent stents, medical management, normal LVEF on echo. No further chest pain. . Continue bystolic, ASA, Crestor  Chronic noncardiac chest pain due to esophageal spasm.  HTN BP well controlled  HLD on crestor  Mild AS will follow with echo's  HLD LDL 56 on crestor  History of orthostatic hypotension - appears dehydrated-cut back on soda's, sugars and drink more water. Check bmet           Dispo:  No follow-ups on file.   Medication Adjustments/Labs and Tests Ordered: Current medicines are reviewed at length with the patient today.  Concerns regarding medicines are outlined above.  Tests Ordered: Orders Placed This Encounter  Procedures   Basic metabolic panel   Medication Changes: No orders of the defined types were placed in this encounter.  Signed, Ermalinda Barrios, PA-C  09/30/2021 12:17 PM    [address] [place], [place],   [address] Phone: [phone]; Fax: [phone]

## 2021-09-29 DIAGNOSIS — E039 Hypothyroidism, unspecified: Secondary | ICD-10-CM | POA: Diagnosis not present

## 2021-09-30 ENCOUNTER — Ambulatory Visit: Payer: PPO | Attending: Physician Assistant | Admitting: Physician Assistant

## 2021-09-30 ENCOUNTER — Encounter: Payer: Self-pay | Admitting: Physician Assistant

## 2021-09-30 VITALS — BP 128/72 | HR 61 | Ht 59.0 in | Wt 124.0 lb

## 2021-09-30 DIAGNOSIS — I10 Essential (primary) hypertension: Secondary | ICD-10-CM

## 2021-09-30 DIAGNOSIS — R079 Chest pain, unspecified: Secondary | ICD-10-CM

## 2021-09-30 DIAGNOSIS — I2510 Atherosclerotic heart disease of native coronary artery without angina pectoris: Secondary | ICD-10-CM

## 2021-09-30 DIAGNOSIS — I350 Nonrheumatic aortic (valve) stenosis: Secondary | ICD-10-CM | POA: Diagnosis not present

## 2021-09-30 DIAGNOSIS — I951 Orthostatic hypotension: Secondary | ICD-10-CM | POA: Diagnosis not present

## 2021-09-30 DIAGNOSIS — E785 Hyperlipidemia, unspecified: Secondary | ICD-10-CM | POA: Diagnosis not present

## 2021-09-30 NOTE — Patient Instructions (Signed)
Medication Instructions:  Your physician recommends that you continue on your current medications as directed. Please refer to the Current Medication list given to you today.  *If you need a refill on your cardiac medications before your next appointment, please call your pharmacy*   Lab Work: TODAY: BMET  If you have labs (blood work) drawn today and your tests are completely normal, you will receive your results only by: [place] (if you have MyChart) OR A paper copy in the mail If you have any lab test that is abnormal or we need to change your treatment, we will call you to review the results.   Follow-Up: At [facility], you and your health needs are our priority.  As part of our continuing mission to provide you with exceptional heart care, we have created designated Provider Care Teams.  These Care Teams include your primary Cardiologist (physician) and Advanced Practice Providers (APPs -  Physician Assistants and Nurse Practitioners) who all work together to provide you with the care you need, when you need it.  We recommend signing up for the patient portal called "MyChart".  Sign up information is provided on this After Visit Summary.  MyChart is used to connect with patients for Virtual Visits (Telemedicine).  Patients are able to view lab/test results, encounter notes, upcoming appointments, etc.  Non-urgent messages can be sent to your provider as well.   To learn more about what you can do with MyChart, go to [email or web address].    Your next appointment:   6 month(s)  The format for your next appointment:   In Person  Provider:   Fransico Him, MD

## 2021-10-01 ENCOUNTER — Other Ambulatory Visit: Payer: Self-pay

## 2021-10-01 DIAGNOSIS — I10 Essential (primary) hypertension: Secondary | ICD-10-CM

## 2021-10-01 LAB — BASIC METABOLIC PANEL
BUN/Creatinine Ratio: 12 (ref 12–28)
BUN: 16 mg/dL (ref 8–27)
CO2: 20 mmol/L (ref 20–29)
Calcium: 9.6 mg/dL (ref 8.7–10.3)
Chloride: 105 mmol/L (ref 96–106)
Creatinine, Ser: 1.3 mg/dL — ABNORMAL HIGH (ref 0.57–1.00)
Glucose: 113 mg/dL — ABNORMAL HIGH (ref 70–99)
Potassium: 4.2 mmol/L (ref 3.5–5.2)
Sodium: 143 mmol/L (ref 134–144)
eGFR: 43 mL/min/{1.73_m2} — ABNORMAL LOW (ref >59)

## 2021-10-15 ENCOUNTER — Ambulatory Visit: Payer: PPO | Attending: Physician Assistant

## 2021-10-15 DIAGNOSIS — I10 Essential (primary) hypertension: Secondary | ICD-10-CM | POA: Diagnosis not present

## 2021-10-16 LAB — BASIC METABOLIC PANEL
BUN/Creatinine Ratio: 13 (ref 12–28)
BUN: 17 mg/dL (ref 8–27)
CO2: 22 mmol/L (ref 20–29)
Calcium: 9.8 mg/dL (ref 8.7–10.3)
Chloride: 106 mmol/L (ref 96–106)
Creatinine, Ser: 1.26 mg/dL — ABNORMAL HIGH (ref 0.57–1.00)
Glucose: 131 mg/dL — ABNORMAL HIGH (ref 70–99)
Potassium: 4.2 mmol/L (ref 3.5–5.2)
Sodium: 142 mmol/L (ref 134–144)
eGFR: 45 mL/min/{1.73_m2} — ABNORMAL LOW (ref >59)

## 2021-10-20 DIAGNOSIS — I25110 Atherosclerotic heart disease of native coronary artery with unstable angina pectoris: Secondary | ICD-10-CM | POA: Diagnosis not present

## 2021-10-20 DIAGNOSIS — J8410 Pulmonary fibrosis, unspecified: Secondary | ICD-10-CM | POA: Diagnosis not present

## 2021-10-20 DIAGNOSIS — M1990 Unspecified osteoarthritis, unspecified site: Secondary | ICD-10-CM | POA: Diagnosis not present

## 2021-10-20 DIAGNOSIS — M5459 Other low back pain: Secondary | ICD-10-CM | POA: Diagnosis not present

## 2021-10-20 DIAGNOSIS — N1831 Chronic kidney disease, stage 3a: Secondary | ICD-10-CM | POA: Diagnosis not present

## 2021-10-20 DIAGNOSIS — I119 Hypertensive heart disease without heart failure: Secondary | ICD-10-CM | POA: Diagnosis not present

## 2021-10-20 DIAGNOSIS — G40109 Localization-related (focal) (partial) symptomatic epilepsy and epileptic syndromes with simple partial seizures, not intractable, without status epilepticus: Secondary | ICD-10-CM | POA: Diagnosis not present

## 2021-10-20 DIAGNOSIS — K219 Gastro-esophageal reflux disease without esophagitis: Secondary | ICD-10-CM | POA: Diagnosis not present

## 2021-10-20 DIAGNOSIS — W19XXXD Unspecified fall, subsequent encounter: Secondary | ICD-10-CM | POA: Diagnosis not present

## 2021-10-20 DIAGNOSIS — S0181XD Laceration without foreign body of other part of head, subsequent encounter: Secondary | ICD-10-CM | POA: Diagnosis not present

## 2021-10-27 DIAGNOSIS — Z0000 Encounter for general adult medical examination without abnormal findings: Secondary | ICD-10-CM | POA: Diagnosis not present

## 2021-10-27 DIAGNOSIS — I10 Essential (primary) hypertension: Secondary | ICD-10-CM | POA: Diagnosis not present

## 2021-10-27 DIAGNOSIS — Z136 Encounter for screening for cardiovascular disorders: Secondary | ICD-10-CM | POA: Diagnosis not present

## 2021-10-29 DIAGNOSIS — Z1331 Encounter for screening for depression: Secondary | ICD-10-CM | POA: Diagnosis not present

## 2021-10-29 DIAGNOSIS — I10 Essential (primary) hypertension: Secondary | ICD-10-CM | POA: Diagnosis not present

## 2021-10-29 DIAGNOSIS — Z23 Encounter for immunization: Secondary | ICD-10-CM | POA: Diagnosis not present

## 2021-10-29 DIAGNOSIS — E7800 Pure hypercholesterolemia, unspecified: Secondary | ICD-10-CM | POA: Diagnosis not present

## 2021-10-29 DIAGNOSIS — N183 Chronic kidney disease, stage 3 unspecified: Secondary | ICD-10-CM | POA: Diagnosis not present

## 2021-10-29 DIAGNOSIS — F324 Major depressive disorder, single episode, in partial remission: Secondary | ICD-10-CM | POA: Diagnosis not present

## 2021-10-29 DIAGNOSIS — E039 Hypothyroidism, unspecified: Secondary | ICD-10-CM | POA: Diagnosis not present

## 2021-10-29 DIAGNOSIS — I2510 Atherosclerotic heart disease of native coronary artery without angina pectoris: Secondary | ICD-10-CM | POA: Diagnosis not present

## 2021-10-29 DIAGNOSIS — Z0000 Encounter for general adult medical examination without abnormal findings: Secondary | ICD-10-CM | POA: Diagnosis not present

## 2021-11-06 DIAGNOSIS — N1831 Chronic kidney disease, stage 3a: Secondary | ICD-10-CM | POA: Diagnosis not present

## 2021-11-06 DIAGNOSIS — I129 Hypertensive chronic kidney disease with stage 1 through stage 4 chronic kidney disease, or unspecified chronic kidney disease: Secondary | ICD-10-CM | POA: Diagnosis not present

## 2021-11-06 DIAGNOSIS — N1832 Chronic kidney disease, stage 3b: Secondary | ICD-10-CM | POA: Diagnosis not present

## 2021-11-06 DIAGNOSIS — N183 Chronic kidney disease, stage 3 unspecified: Secondary | ICD-10-CM | POA: Diagnosis not present

## 2021-11-06 DIAGNOSIS — E559 Vitamin D deficiency, unspecified: Secondary | ICD-10-CM | POA: Diagnosis not present

## 2021-11-06 DIAGNOSIS — D631 Anemia in chronic kidney disease: Secondary | ICD-10-CM | POA: Diagnosis not present

## 2021-11-06 DIAGNOSIS — M898X9 Other specified disorders of bone, unspecified site: Secondary | ICD-10-CM | POA: Diagnosis not present

## 2021-11-09 DIAGNOSIS — E039 Hypothyroidism, unspecified: Secondary | ICD-10-CM | POA: Diagnosis not present

## 2021-11-11 DIAGNOSIS — K219 Gastro-esophageal reflux disease without esophagitis: Secondary | ICD-10-CM | POA: Diagnosis not present

## 2021-11-11 DIAGNOSIS — I119 Hypertensive heart disease without heart failure: Secondary | ICD-10-CM | POA: Diagnosis not present

## 2021-11-11 DIAGNOSIS — G40109 Localization-related (focal) (partial) symptomatic epilepsy and epileptic syndromes with simple partial seizures, not intractable, without status epilepticus: Secondary | ICD-10-CM | POA: Diagnosis not present

## 2021-11-11 DIAGNOSIS — M1990 Unspecified osteoarthritis, unspecified site: Secondary | ICD-10-CM | POA: Diagnosis not present

## 2021-11-11 DIAGNOSIS — I25110 Atherosclerotic heart disease of native coronary artery with unstable angina pectoris: Secondary | ICD-10-CM | POA: Diagnosis not present

## 2021-11-11 DIAGNOSIS — N1831 Chronic kidney disease, stage 3a: Secondary | ICD-10-CM | POA: Diagnosis not present

## 2021-11-11 DIAGNOSIS — W19XXXD Unspecified fall, subsequent encounter: Secondary | ICD-10-CM | POA: Diagnosis not present

## 2021-11-11 DIAGNOSIS — J8410 Pulmonary fibrosis, unspecified: Secondary | ICD-10-CM | POA: Diagnosis not present

## 2021-11-11 DIAGNOSIS — S0181XD Laceration without foreign body of other part of head, subsequent encounter: Secondary | ICD-10-CM | POA: Diagnosis not present

## 2021-11-11 DIAGNOSIS — M5459 Other low back pain: Secondary | ICD-10-CM | POA: Diagnosis not present

## 2021-11-23 DIAGNOSIS — F331 Major depressive disorder, recurrent, moderate: Secondary | ICD-10-CM | POA: Diagnosis not present

## 2021-12-16 DIAGNOSIS — M25562 Pain in left knee: Secondary | ICD-10-CM | POA: Diagnosis not present

## 2021-12-16 DIAGNOSIS — M25511 Pain in right shoulder: Secondary | ICD-10-CM | POA: Diagnosis not present

## 2021-12-16 DIAGNOSIS — M25561 Pain in right knee: Secondary | ICD-10-CM | POA: Diagnosis not present

## 2021-12-22 DIAGNOSIS — F331 Major depressive disorder, recurrent, moderate: Secondary | ICD-10-CM | POA: Diagnosis not present

## 2021-12-22 DIAGNOSIS — E1165 Type 2 diabetes mellitus with hyperglycemia: Secondary | ICD-10-CM | POA: Diagnosis not present

## 2021-12-22 DIAGNOSIS — I10 Essential (primary) hypertension: Secondary | ICD-10-CM | POA: Diagnosis not present

## 2021-12-29 ENCOUNTER — Other Ambulatory Visit: Payer: Self-pay | Admitting: Neurology

## 2022-01-20 DIAGNOSIS — E039 Hypothyroidism, unspecified: Secondary | ICD-10-CM | POA: Diagnosis not present

## 2022-01-20 DIAGNOSIS — E1165 Type 2 diabetes mellitus with hyperglycemia: Secondary | ICD-10-CM | POA: Diagnosis not present

## 2022-01-27 DIAGNOSIS — H35362 Drusen (degenerative) of macula, left eye: Secondary | ICD-10-CM | POA: Diagnosis not present

## 2022-01-28 DIAGNOSIS — E039 Hypothyroidism, unspecified: Secondary | ICD-10-CM | POA: Diagnosis not present

## 2022-01-28 DIAGNOSIS — I10 Essential (primary) hypertension: Secondary | ICD-10-CM | POA: Diagnosis not present

## 2022-01-28 DIAGNOSIS — E1165 Type 2 diabetes mellitus with hyperglycemia: Secondary | ICD-10-CM | POA: Diagnosis not present

## 2022-01-28 DIAGNOSIS — N189 Chronic kidney disease, unspecified: Secondary | ICD-10-CM | POA: Diagnosis not present

## 2022-01-28 DIAGNOSIS — E200 Idiopathic hypoparathyroidism: Secondary | ICD-10-CM | POA: Diagnosis not present

## 2022-02-02 ENCOUNTER — Encounter: Payer: Self-pay | Admitting: Adult Health

## 2022-02-02 ENCOUNTER — Ambulatory Visit (INDEPENDENT_AMBULATORY_CARE_PROVIDER_SITE_OTHER): Payer: No Typology Code available for payment source | Admitting: Adult Health

## 2022-02-02 VITALS — BP 149/76 | HR 67 | Ht <= 58 in | Wt 124.4 lb

## 2022-02-02 DIAGNOSIS — G40109 Localization-related (focal) (partial) symptomatic epilepsy and epileptic syndromes with simple partial seizures, not intractable, without status epilepticus: Secondary | ICD-10-CM

## 2022-02-02 NOTE — Progress Notes (Signed)
PATIENT: [name] DOB: May 31, 1945  REASON FOR VISIT: follow up HISTORY FROM: patient  Chief Complaint  Patient presents with   Follow-up    Pt in 8 Pt here for seizures f/u  Pt states her husband died last 22 . Pt states she has a lot anxiety Pt states she fell last year after husband passed away .     HISTORY OF PRESENT ILLNESS: Today 02/02/22:  [name] is a 77 y.o. female with a history of seizures. Returns today for follow-up.  She remains on Keppra 500 mg twice a day.  Denies any seizure-like events.  She does report that her husband passed away last Jun 10, 2022. Reports that she operates motor vehicle. Able to complete all ADLs independently. Has a niece that helps with her bills. And Neighbors help if she needs something.      02/02/21: [name] is a 77 year old female with a history of seizures.  She returns today for follow-up.  She denies any seizure events.  Continues on Keppra 500 mg twice a day.  She operates a motor vehicle on rare occasions.  Reports most the time her husband drives.  Denies any new symptoms.  Returns today for an evaluation. HISTORY  10/16/19 [name] is a 76 year old female with history of seizures.  She is on Keppra 500 mg twice a day.  Her seizures are nocturnally. Knows a seizure has happened because she will wake up and the right side of her face will be drawn up and numb.  No recurrent seizure in the last year.  Tolerating medicine well.  She has a chronic tremor to her jaw.  She had a recent heart catheterization, reportedly no blockages.  She lives with her husband, does not drive much.  No recent falls, ambulates with assistance.  Presents today for evaluation unaccompanied.  REVIEW OF SYSTEMS: Out of a complete 14 system review of symptoms, the patient complains only of the following symptoms, and all other reviewed systems are negative.  ALLERGIES: No Known Allergies  HOME MEDICATIONS: Outpatient Medications Prior to Visit   Medication Sig Dispense Refill   ALPRAZolam (XANAX) 0.25 MG tablet Take 0.25 mg by mouth 2 (two) times daily.      aspirin EC 81 MG tablet Take 81 mg by mouth daily.     calcitRIOL (ROCALTROL) 0.25 MCG capsule Take 0.25 mcg by mouth daily.     CVS D3 50 MCG (2000 UT) CAPS Take 2,000 Units by mouth daily.     diltiazem (CARDIZEM LA) 180 MG 24 hr tablet Take 180 mg by mouth daily.     DULoxetine (CYMBALTA) 60 MG capsule Take 60 mg by mouth daily.     ferrous sulfate 325 (65 FE) MG tablet Take 325 mg by mouth daily with breakfast.     levETIRAcetam (KEPPRA) 500 MG tablet TAKE 1 TABLET BY MOUTH TWICE A DAY 180 tablet 4   levothyroxine (SYNTHROID) 100 MCG tablet Take 100 mcg by mouth every morning.     magnesium oxide (MAG-OX) 400 (240 Mg) MG tablet Take 400 mg by mouth 2 (two) times daily.     nebivolol (BYSTOLIC) 5 MG tablet Take 2.5 mg by mouth in the morning and at bedtime.     nitroGLYCERIN (NITROSTAT) 0.4 MG SL tablet PLACE 1 TABLET (0.4 MG TOTAL) UNDER THE TONGUE EVERY 5 (FIVE) MINUTES AS NEEDED FOR CHEST PAIN. 25 tablet 6   pantoprazole (PROTONIX) 40 MG tablet Take 1 tablet (40 mg total) by  mouth 2 (two) times daily. 180 tablet 3   potassium chloride SA (K-DUR,KLOR-CON) 20 MEQ tablet Take 20 mEq by mouth 2 (two) times daily.      rosuvastatin (CRESTOR) 40 MG tablet Take 1 tablet (40 mg total) by mouth daily. 90 tablet 3   No facility-administered medications prior to visit.    PAST MEDICAL HISTORY: Past Medical History:  Diagnosis Date   Anxiety    Arthritis    Blood transfusion without reported diagnosis    childhood   Carpal tunnel syndrome, bilateral 07/31/2015   Cataract    Cervical spondylosis without myelopathy    Chronic chest pain    Chronic kidney disease    Chronic low back pain    Chronotropic incompetence    CKD (chronic kidney disease), stage III ([place])    Coronary artery disease    a. inf-post MI 2011 s/p DES to RCA. b. DES to RCA 06/2010. c. DES to LAD 2014,  residual dz treated medically.   Depression    DVT (deep venous thrombosis) (HCC)    left   Esophageal stricture    GERD (gastroesophageal reflux disease)    Gross hematuria    Heart murmur    History of esophageal dilatation    FOR STRIUCTURE   History of gout    History of hiatal hernia    History of kidney stones    Hyperlipidemia    Hypertension    Hypothyroidism    Internal hemorrhoids    Irritable bowel syndrome    Mild aortic stenosis    by echo 2018   Myocardial infarction ([place])    Obesity    Orthostatic hypotension    Partial seizure disorder ([place]) NEUROLOGIST-- DR Jannifer Franklin   NOCTURNAL   PONV (postoperative nausea and vomiting)    hard time getting iv site-had to do neck stick 2 yrs ago   Pulmonary fibrosis ([place])    S/P pericardial cyst excision    02-05-2013  benign   Seizures ([place])    none in yrs   SVT (supraventricular tachycardia)    Trigger finger, acquired 09/30/2015   Right middle finger   Vitamin D deficiency     PAST SURGICAL HISTORY: Past Surgical History:  Procedure Laterality Date   BIOPSY OF MEDIASTINAL MASS N/A 02/05/2013   Procedure: RESECTION OF MEDIASTINAL MASS;  Surgeon: Ivin Poot, MD;  Location: [place];  Service: Thoracic;  Laterality: N/A;   CARDIAC CATHETERIZATION Right 01/24/2015   Procedure: right femoral ARTERIAL LINE INSERTION;  Surgeon: Ivin Poot, MD;  Location: [place];  Service: Thoracic;  Laterality: Right;   CARDIOVASCULAR STRESS TEST  11-22-2013  dr Irish Lack   normal lexiscan study/  no ischemia/  normal LVF and wall motion/ ef 81%   COLONOSCOPY  last one 2014   CORONARY ANGIOGRAM  03/10/2011   Procedure: CORONARY ANGIOGRAM;  Surgeon: Jettie Booze, MD;  Location: [facility] CATH LAB;  Service: Cardiovascular;;   CORONARY ANGIOPLASTY WITH STENT PLACEMENT  11-22-2009  dr Irish Lack   Acute inferoposterior MI/  ef 60%,  PCI with DES x1 to dRCA,  25%  mLAD   CORONARY ANGIOPLASTY WITH STENT PLACEMENT  06-26-2010  dr Daneen Schick    Cutting balloon angioplasty to dRCA with DES x1,  40% mLAD (non-obstructive CAD)   CYSTOSCOPY WITH RETROGRADE PYELOGRAM, URETEROSCOPY AND STENT PLACEMENT Right 02/13/2014   Procedure: CYSTOSCOPY WITH RETROGRADE PYELOGRAM, URETEROSCOPY AND STENT PLACEMENT;  Surgeon: Bernestine Amass, MD;  Location[facility];  Service: Urology;  Laterality: Right;   ECTOPIC PREGNANCY SURGERY  YRS AGO   SALPINGECTOMY   ESOPHAGOGASTRODUODENOSCOPY N/A 02/15/2014   Procedure: ESOPHAGOGASTRODUODENOSCOPY (EGD);  Surgeon: Jerene Bears, MD;  Location: [facility];  Service: Endoscopy;  Laterality: N/A;   ESOPHAGOGASTRODUODENOSCOPY (EGD) WITH ESOPHAGEAL DILATION  05-20-2011   HOLMIUM LASER APPLICATION Right 01/15/4006   Procedure: HOLMIUM LASER APPLICATION;  Surgeon: Bernestine Amass, MD;  Location: [facility];  Service: Urology;  Laterality: Right;   LEFT HEART CATH AND CORONARY ANGIOGRAPHY N/A 09/12/2019   Procedure: LEFT HEART CATH AND CORONARY ANGIOGRAPHY;  Surgeon: Jettie Booze, MD;  Location: [place] CV LAB;  Service: Cardiovascular;  Laterality: N/A;   LEFT HEART CATH AND CORONARY ANGIOGRAPHY N/A 09/16/2021   Procedure: LEFT HEART CATH AND CORONARY ANGIOGRAPHY;  Surgeon: Martinique, Peter M, MD;  Location: [place] CV LAB;  Service: Cardiovascular;  Laterality: N/A;   LEFT HEART CATHETERIZATION WITH CORONARY ANGIOGRAM N/A 03/14/2012   Procedure: LEFT HEART CATHETERIZATION WITH CORONARY ANGIOGRAM;  Surgeon: Sueanne Margarita, MD;  Location: [place] CATH LAB;  Service: Cardiovascular;  Laterality: N/A;  Normal LM,  50% pLAD,  70% mLAD,  D2 50-70%, very tortuous LAD,  70-82% ostial LCFX,  50% in-stent restenosis of  dRCA and mRCA  stent and 50-70% ostial PDA,  normal LVSF, ef 60%   LUNG BIOPSY Right 01/24/2015   Procedure: RIGHT LUNG BIOPSY;  Surgeon: Ivin Poot, MD;  Location: [place];  Service: Thoracic;  Laterality: Right;   MEDIASTERNOTOMY N/A 02/05/2013   Procedure: MEDIAN STERNOTOMY;   Surgeon: Ivin Poot, MD;  Location: [place];  Service: Thoracic;  Laterality: N/A;   MEDIASTERNOTOMY Left 02/05/2013   NEEDLE GUIDED EXCISION BREAST CALCIFICATIONS Right 08-09-2008   PERCUTANEOUS CORONARY STENT INTERVENTION (PCI-S) N/A 04/03/2012   Procedure: PERCUTANEOUS CORONARY STENT INTERVENTION (PCI-S);  Surgeon: Jettie Booze, MD;  Location: [facility] CATH LAB;  Service: Cardiovascular;  Laterality: N/A;   Successful PCI  mLAD with 2.75x12 Promus stent, postdilated to >0.93m   THORACOTOMY Right 01/24/2015   Procedure: RIGHT MINI/LIMITED THORACOTOMY;  Surgeon: PIvin Poot MD;  [facility];  Service: Thoracic;  Laterality: Right;   TRANSTHORACIC ECHOCARDIOGRAM  11-17-2011   grade I diastolic dysfunction/  ef 55-60%   VIDEO BRONCHOSCOPY N/A 01/24/2015   Procedure: RIGHT VIDEO BRONCHOSCOPY;  Surgeon: PIvin Poot MD;  [place]  Service: Thoracic;  Laterality: N/A;    FAMILY HISTORY: Family History  Problem Relation Age of Onset   Heart attack Mother    CVA Mother    Heart disease Father    Hypertension Father    Emphysema Father    Coronary artery disease Father    Diabetes Mellitus I Father    Breast cancer Sister    Breast cancer Niece        niece   Colon cancer Neg Hx    Stomach cancer Neg Hx    Esophageal cancer Neg Hx    Rectal cancer Neg Hx    Seizures Neg Hx     SOCIAL HISTORY: Social History   Socioeconomic History   Marital status: Married    Spouse name: LFritz Pickerel  Number of children: 0   Years of education: hs   Highest education level: Not on file  Occupational History   Occupation: Retired    [employer] farm manager RETIRED  Tobacco Use   Smoking status: Former    Packs/day: 0.30    Years: 3.00    Total  pack years: 0.90    Types: Cigarettes    Start date: 11/08/1964    Quit date: 11/09/1967    Years since quitting: 54.2   Smokeless tobacco: Never  Vaping Use   Vaping Use: Never used  Substance and Sexual Activity   Alcohol use: No     Alcohol/week: 0.0 standard drinks of alcohol   Drug use: No   Sexual activity: Not on file  Other Topics Concern   Not on file  Social History Narrative   Lives at home, married   Left-handed   Daily caffeine use: coffee.   Social Determinants of Health   Financial Resource Strain: Not on file  Food Insecurity: No Food Insecurity (09/16/2021)   Hunger Vital Sign    Worried About Running Out of Food in the Last Year: Never true    Ran Out of Food in the Last Year: Never true  Transportation Needs: No Transportation Needs (09/16/2021)   PRAPARE - Hydrologist (Medical): No    Lack of Transportation (Non-Medical): No  Physical Activity: Not on file  Stress: Not on file  Social Connections: Not on file  Intimate Partner Violence: Not At Risk (09/16/2021)   Humiliation, Afraid, Rape, and Kick questionnaire    Fear of Current or Ex-Partner: No    Emotionally Abused: No    Physically Abused: No    Sexually Abused: No      PHYSICAL EXAM  Vitals:   02/02/22 1058  BP: (!) 149/76  Pulse: 67  Weight: 124 lb 6.4 oz (56.4 kg)  Height: '4\' 10"'$  (1.473 m)   Body mass index is 26 kg/m.  Generalized: Well developed, in no acute distress   Neurological examination  Mentation: Alert oriented to time, place, history taking. Follows all commands speech and language fluent Cranial nerve II-XII: Pupils were equal round reactive to light. Extraocular movements were full, visual field were full on confrontational test. Facial sensation and strength were normal.  Tremor in jaw. Head turning and shoulder shrug  were normal and symmetric. Motor: The motor testing reveals 5 over 5 strength of all 4 extremities. Good symmetric motor tone is noted throughout.  Sensory: Sensory testing is intact to soft touch on all 4 extremities. No evidence of extinction is noted.  Coordination: Cerebellar testing reveals good finger-nose-finger and heel-to-shin bilaterally.  Gait and  station: Gait is normal.  Reflexes: Deep tendon reflexes are symmetric and normal bilaterally.   DIAGNOSTIC DATA (LABS, IMAGING, TESTING) - I reviewed patient records, labs, notes, testing and imaging myself where available.  Lab Results  Component Value Date   WBC 7.8 09/16/2021   HGB 13.4 09/16/2021   HCT 41.3 09/16/2021   MCV 87.5 09/16/2021   PLT 285 09/16/2021      Component Value Date/Time   NA 142 10/15/2021 0850   K 4.2 10/15/2021 0850   CL 106 10/15/2021 0850   CO2 22 10/15/2021 0850   GLUCOSE 131 (H) 10/15/2021 0850   GLUCOSE 114 (H) 09/17/2021 0233   BUN 17 10/15/2021 0850   CREATININE 1.26 (H) 10/15/2021 0850   CREATININE 1.12 (H) 06/06/2015 0928   CALCIUM 9.8 10/15/2021 0850   PROT 8.3 (H) 03/08/2018 0858   PROT 6.8 11/03/2017 0859   ALBUMIN 3.8 03/08/2018 0858   ALBUMIN 4.0 11/03/2017 0859   AST 14 (L) 03/08/2018 0858   ALT 8 03/12/2021 0956   ALKPHOS 92 03/08/2018 0858   BILITOT 0.8 03/08/2018 0858   BILITOT 0.4 11/03/2017  0859   GFRNONAA 46 (L) 09/17/2021 0233   GFRAA 51 (L) 09/04/2019 1623   Lab Results  Component Value Date   CHOL 138 09/16/2021   HDL 56 09/16/2021   LDLCALC 60 09/16/2021   TRIG 110 09/16/2021   CHOLHDL 2.5 09/16/2021   Lab Results  Component Value Date   HGBA1C 6.5 (H) 10/15/2014   Lab Results  Component Value Date   VITAMINB12 281 01/30/2010   Lab Results  Component Value Date   TSH 1.063 08/18/2017      ASSESSMENT AND PLAN 77 y.o. year old female  has a past medical history of Anxiety, Arthritis, Blood transfusion without reported diagnosis, Carpal tunnel syndrome, bilateral (07/31/2015), Cataract, Cervical spondylosis without myelopathy, Chronic chest pain, Chronic kidney disease, Chronic low back pain, Chronotropic incompetence, CKD (chronic kidney disease), stage III ([place]), Coronary artery disease, Depression, DVT (deep venous thrombosis) ([place]), Esophageal stricture, GERD (gastroesophageal reflux disease), Gross  hematuria, Heart murmur, History of esophageal dilatation, History of gout, History of hiatal hernia, History of kidney stones, Hyperlipidemia, Hypertension, Hypothyroidism, Internal hemorrhoids, Irritable bowel syndrome, Mild aortic stenosis, Myocardial infarction ([place]), Obesity, Orthostatic hypotension, Partial seizure disorder ([place]) (NEUROLOGIST-- DR Jannifer Franklin), PONV (postoperative nausea and vomiting), Pulmonary fibrosis ([place]), S/P pericardial cyst excision, Seizures ([place]), SVT (supraventricular tachycardia), Trigger finger, acquired (09/30/2015), and Vitamin D deficiency. here with:  1.  Seizures  Continue Keppra 500 mg twice a day Advised if she has any seizure events to let us know Follow-up in 1 year or sooner if needed     Ward Givens, MSN, NP-C 02/02/2022, 11:06 AM [facility] [address] [place], [place] [address] [phone]

## 2022-02-02 NOTE — Patient Instructions (Signed)
Your Plan:  Continue Keppra If your symptoms worsen or you develop new symptoms please let us know.    Thank you for coming to see [place] at [facility]. I hope we have been able to provide you high quality care today.  You may receive a patient satisfaction survey over the next few weeks. We would appreciate your feedback and comments so that we may continue to improve ourselves and the health of our patients.

## 2022-02-23 DIAGNOSIS — K219 Gastro-esophageal reflux disease without esophagitis: Secondary | ICD-10-CM | POA: Diagnosis not present

## 2022-02-23 DIAGNOSIS — F324 Major depressive disorder, single episode, in partial remission: Secondary | ICD-10-CM | POA: Diagnosis not present

## 2022-02-23 DIAGNOSIS — N183 Chronic kidney disease, stage 3 unspecified: Secondary | ICD-10-CM | POA: Diagnosis not present

## 2022-02-23 DIAGNOSIS — E1165 Type 2 diabetes mellitus with hyperglycemia: Secondary | ICD-10-CM | POA: Diagnosis not present

## 2022-02-23 DIAGNOSIS — I25119 Atherosclerotic heart disease of native coronary artery with unspecified angina pectoris: Secondary | ICD-10-CM | POA: Diagnosis not present

## 2022-02-23 DIAGNOSIS — E7800 Pure hypercholesterolemia, unspecified: Secondary | ICD-10-CM | POA: Diagnosis not present

## 2022-02-23 DIAGNOSIS — I5032 Chronic diastolic (congestive) heart failure: Secondary | ICD-10-CM | POA: Diagnosis not present

## 2022-02-23 DIAGNOSIS — R569 Unspecified convulsions: Secondary | ICD-10-CM | POA: Diagnosis not present

## 2022-02-23 DIAGNOSIS — E1122 Type 2 diabetes mellitus with diabetic chronic kidney disease: Secondary | ICD-10-CM | POA: Diagnosis not present

## 2022-02-23 DIAGNOSIS — E039 Hypothyroidism, unspecified: Secondary | ICD-10-CM | POA: Diagnosis not present

## 2022-03-02 ENCOUNTER — Encounter: Payer: Self-pay | Admitting: Cardiology

## 2022-03-02 ENCOUNTER — Ambulatory Visit: Payer: No Typology Code available for payment source | Attending: Cardiology | Admitting: Cardiology

## 2022-03-02 VITALS — BP 112/70 | HR 69 | Ht 59.0 in | Wt 122.4 lb

## 2022-03-02 DIAGNOSIS — I350 Nonrheumatic aortic (valve) stenosis: Secondary | ICD-10-CM | POA: Diagnosis not present

## 2022-03-02 DIAGNOSIS — I951 Orthostatic hypotension: Secondary | ICD-10-CM

## 2022-03-02 DIAGNOSIS — Q248 Other specified congenital malformations of heart: Secondary | ICD-10-CM

## 2022-03-02 DIAGNOSIS — I2583 Coronary atherosclerosis due to lipid rich plaque: Secondary | ICD-10-CM | POA: Diagnosis not present

## 2022-03-02 DIAGNOSIS — I471 Supraventricular tachycardia, unspecified: Secondary | ICD-10-CM | POA: Diagnosis not present

## 2022-03-02 DIAGNOSIS — I10 Essential (primary) hypertension: Secondary | ICD-10-CM | POA: Diagnosis not present

## 2022-03-02 DIAGNOSIS — I2510 Atherosclerotic heart disease of native coronary artery without angina pectoris: Secondary | ICD-10-CM

## 2022-03-02 DIAGNOSIS — E7800 Pure hypercholesterolemia, unspecified: Secondary | ICD-10-CM

## 2022-03-02 MED ORDER — NEBIVOLOL HCL 5 MG PO TABS: 2.5000 mg | ORAL_TABLET | Freq: Two times a day (BID) | ORAL | 3 refills | Status: DC

## 2022-03-02 MED ORDER — DILTIAZEM HCL ER 180 MG PO TB24: 180.0000 mg | ORAL_TABLET | Freq: Every day | ORAL | 3 refills | Status: DC

## 2022-03-02 MED ORDER — ROSUVASTATIN CALCIUM 40 MG PO TABS: 40.0000 mg | ORAL_TABLET | Freq: Every day | ORAL | 3 refills | Status: DC

## 2022-03-02 NOTE — Addendum Note (Signed)
Addended by: Joni Reining on: 03/02/2022 10:56 AM   Modules accepted: Orders

## 2022-03-02 NOTE — Patient Instructions (Signed)
Medication Instructions:  Your physician recommends that you continue on your current medications as directed. Please refer to the Current Medication list given to you today.  *If you need a refill on your cardiac medications before your next appointment, please call your pharmacy*   Lab Work: None.  If you have labs (blood work) drawn today and your tests are completely normal, you will receive your results only by: [place] (if you have MyChart) OR A paper copy in the mail If you have any lab test that is abnormal or we need to change your treatment, we will call you to review the results.   Testing/Procedures: None.   Follow-Up: At [facility], you and your health needs are our priority.  As part of our continuing mission to provide you with exceptional heart care, we have created designated Provider Care Teams.  These Care Teams include your primary Cardiologist (physician) and Advanced Practice Providers (APPs -  Physician Assistants and Nurse Practitioners) who all work together to provide you with the care you need, when you need it.  We recommend signing up for the patient portal called "MyChart".  Sign up information is provided on this After Visit Summary.  MyChart is used to connect with patients for Virtual Visits (Telemedicine).  Patients are able to view lab/test results, encounter notes, upcoming appointments, etc.  Non-urgent messages can be sent to your provider as well.   To learn more about what you can do with MyChart, go to [email or web address].    Your next appointment:   1 year(s)  Provider:   Fransico Him, MD

## 2022-03-02 NOTE — Progress Notes (Signed)
Cardiology Office Note:    Date:  03/02/2022   ID:  [name], DOB 29-Jan-1945, MRN [id number]  PCP:  Carol Ada, MD  Cardiologist:  Fransico Him, MD    Referring MD: Carol Ada, MD   Chief Complaint  Patient presents with   Coronary Artery Disease   Hypertension   Hyperlipidemia   Congestive Heart Failure   Aortic Stenosis    History of Present Illness:    [name] is a 77 y.o. female with a hx of CAD/HTN, pericardial cyst s/p resection and dyslipidemia.  She has a history of chronic CP with esophageal strictures and has had esophageal dilatation.  She underwent VATS procedure via mini thoracotomy syndrome with open lung bx and was diagnosed with interstitial lung disease.  She has chronic CP that has been felt to be noncardiac in the past and has started to reoccur.  She has chronic DOE.     She underwent a cardiopulmonary stress test by Pulmonary to evaluate her SOB and it showed deconditioning and poor HR response to exercise.   At the request of the pulmonologist her CCB and BB were stopped as they though her SOB was due to chronotropic incompetence.  She then started having palpitations and was seen by her PCP, Dr. Tamala Julian, and was told to take bystolic PRN for palpitations.   She then saw Dr. [name] who put her back on Cardizem and dropped her amlodipine to 29m daily.    A nuclear stress test was done in January 2018 which showed no ischemia.  This was done for similar chest pain that she had in the past.  This chest pain occurred around the time she lost her dog.     She had had an episode of syncope related to decreased PO intake and stopped her amlodipine, Lasix and doxazosin but continued her on Losartan [id number], Cardizem CD [id number] and Bystolic 38m17maily.    She was  dx with acute left peroneal vein DVT and started on Eliquis.   Her husband called in on 8/15 with complaints that her dBP was elevated and she was instructed to restart her Bystolic [id number] and change to Cardizem CD 120m39mily.   She had problems with bleeding gums and her Plavix was stopped and she was continued on ASA (also on DOAC for DVT).     LexiLeane Call done 9/20 for CP done was normal.  She continued to have CP and underwent cardiac cath in Sept 2021 which showed patent stents and non obstructive disease in the RPDA and Diag of 40%.  It was felt that her CP was GI related.  Her last upper EGD showed multiple gastric polyps and Schatzkis ring which was dilated.    She is here today for followup and is doing well.  She has some DOE at times but this is very stable. She has chronic noncardiac chest pain that is very stable. She denies any PND, orthopnea, LE edema or syncope. She does have some problems with dizziness if she stands too long. She has fluttering in her chest from time to time but nothing sustained. She is compliant with her meds and is tolerating meds with no SE.    Past Medical History:  Diagnosis Date   Anxiety    Arthritis    Blood transfusion without reported diagnosis    childhood   Carpal tunnel syndrome, bilateral 07/31/2015   Cataract    Cervical spondylosis without myelopathy  Chronic chest pain    Chronic kidney disease    Chronic low back pain    Chronotropic incompetence    CKD (chronic kidney disease), stage III (HCC)    Coronary artery disease    a. inf-post MI 2011 s/p DES to RCA. b. DES to RCA 06/2010. c. DES to LAD 2014, residual dz treated medically.   Depression    DVT (deep venous thrombosis) (HCC)    left   Esophageal stricture    GERD (gastroesophageal reflux disease)    Gross hematuria    Heart murmur    History of esophageal dilatation    FOR STRIUCTURE   History of gout    History of hiatal hernia    History of kidney stones    Hyperlipidemia    Hypertension    Hypothyroidism    Internal hemorrhoids    Irritable bowel syndrome    Mild aortic stenosis    by echo 2018   Myocardial infarction ([place])     Obesity    Orthostatic hypotension    Partial seizure disorder ([place]) NEUROLOGIST-- DR Jannifer Franklin   NOCTURNAL   PONV (postoperative nausea and vomiting)    hard time getting iv site-had to do neck stick 2 yrs ago   Pulmonary fibrosis (HCC)    S/P pericardial cyst excision    02-05-2013  benign   Seizures ([place])    none in yrs   SVT (supraventricular tachycardia)    Trigger finger, acquired 09/30/2015   Right middle finger   Vitamin D deficiency     Past Surgical History:  Procedure Laterality Date   BIOPSY OF MEDIASTINAL MASS N/A 02/05/2013   Procedure: RESECTION OF MEDIASTINAL MASS;  Surgeon: Ivin Poot, MD;  Location: [place];  Service: Thoracic;  Laterality: N/A;   CARDIAC CATHETERIZATION Right 01/24/2015   Procedure: right femoral ARTERIAL LINE INSERTION;  Surgeon: Ivin Poot, MD;  Location: [place];  Service: Thoracic;  Laterality: Right;   CARDIOVASCULAR STRESS TEST  11-22-2013  dr Irish Lack   normal lexiscan study/  no ischemia/  normal LVF and wall motion/ ef 81%   COLONOSCOPY  last one 2014   CORONARY ANGIOGRAM  03/10/2011   Procedure: CORONARY ANGIOGRAM;  Surgeon: Jettie Booze, MD;  Location: [facility] CATH LAB;  Service: Cardiovascular;;   CORONARY ANGIOPLASTY WITH STENT PLACEMENT  11-22-2009  dr Irish Lack   Acute inferoposterior MI/  ef 60%,  PCI with DES x1 to dRCA,  25%  mLAD   CORONARY ANGIOPLASTY WITH STENT PLACEMENT  06-26-2010  dr Daneen Schick   Cutting balloon angioplasty to dRCA with DES x1,  40% mLAD (non-obstructive CAD)   CYSTOSCOPY WITH RETROGRADE PYELOGRAM, URETEROSCOPY AND STENT PLACEMENT Right 02/13/2014   Procedure: CYSTOSCOPY WITH RETROGRADE PYELOGRAM, URETEROSCOPY AND STENT PLACEMENT;  Surgeon: Bernestine Amass, MD;  Location: [facility];  Service: Urology;  Laterality: Right;   ECTOPIC PREGNANCY SURGERY  YRS AGO   SALPINGECTOMY   ESOPHAGOGASTRODUODENOSCOPY N/A 02/15/2014   Procedure: ESOPHAGOGASTRODUODENOSCOPY (EGD);  Surgeon: Jerene Bears, MD;   Location: [facility];  Service: Endoscopy;  Laterality: N/A;   ESOPHAGOGASTRODUODENOSCOPY (EGD) WITH ESOPHAGEAL DILATION  05-20-2011   HOLMIUM LASER APPLICATION Right [id number]   Procedure: HOLMIUM LASER APPLICATION;  Surgeon: Bernestine Amass, MD;  Location[facility];  Service: Urology;  Laterality: Right;   LEFT HEART CATH AND CORONARY ANGIOGRAPHY N/A 09/12/2019   Procedure: LEFT HEART CATH AND CORONARY ANGIOGRAPHY;  Surgeon: Jettie Booze, MD;  Location: [facility]  INVASIVE CV LAB;  Service: Cardiovascular;  Laterality: N/A;   LEFT HEART CATH AND CORONARY ANGIOGRAPHY N/A 09/16/2021   Procedure: LEFT HEART CATH AND CORONARY ANGIOGRAPHY;  Surgeon: Martinique, Peter M, MD;  Location: [place] CV LAB;  Service: Cardiovascular;  Laterality: N/A;   LEFT HEART CATHETERIZATION WITH CORONARY ANGIOGRAM N/A 03/14/2012   Procedure: LEFT HEART CATHETERIZATION WITH CORONARY ANGIOGRAM;  Surgeon: Sueanne Margarita, MD;  Location: [place] CATH LAB;  Service: Cardiovascular;  Laterality: N/A;  Normal LM,  50% pLAD,  70% mLAD,  D2 50-70%, very tortuous LAD,  70-82% ostial LCFX,  50% in-stent restenosis of  dRCA and mRCA  stent and 50-70% ostial PDA,  normal LVSF, ef 60%   LUNG BIOPSY Right 01/24/2015   Procedure: RIGHT LUNG BIOPSY;  Surgeon: Ivin Poot, MD;  Location: [place];  Service: Thoracic;  Laterality: Right;   MEDIASTERNOTOMY N/A 02/05/2013   Procedure: MEDIAN STERNOTOMY;  Surgeon: Ivin Poot, MD;  Location: [place];  Service: Thoracic;  Laterality: N/A;   MEDIASTERNOTOMY Left 02/05/2013   NEEDLE GUIDED EXCISION BREAST CALCIFICATIONS Right 08-09-2008   PERCUTANEOUS CORONARY STENT INTERVENTION (PCI-S) N/A 04/03/2012   Procedure: PERCUTANEOUS CORONARY STENT INTERVENTION (PCI-S);  Surgeon: Jettie Booze, MD;  Location: [facility] CATH LAB;  Service: Cardiovascular;  Laterality: N/A;   Successful PCI  mLAD with 2.75x12 Promus stent, postdilated to >0.44m   THORACOTOMY Right 01/24/2015   Procedure: RIGHT  MINI/LIMITED THORACOTOMY;  Surgeon: PIvin Poot MD;  [place]  Service: Thoracic;  Laterality: Right;   TRANSTHORACIC ECHOCARDIOGRAM  11-17-2011   grade I diastolic dysfunction/  ef 55-60%   VIDEO BRONCHOSCOPY N/A 01/24/2015   Procedure: RIGHT VIDEO BRONCHOSCOPY;  Surgeon: PIvin Poot MD;  [facility];  Service: Thoracic;  Laterality: N/A;    Current Medications: Current Meds  Medication Sig   ALPRAZolam (XANAX) 0.25 MG tablet Take 0.25 mg by mouth 2 (two) times daily.    ARIPiprazole (ABILIFY) 2 MG tablet Take 2 mg by mouth daily.   aspirin EC 81 MG tablet Take 81 mg by mouth daily.   calcitRIOL (ROCALTROL) 0.25 MCG capsule Take 0.25 mcg by mouth daily.   CVS D3 50 MCG (2000 UT) CAPS Take 2,000 Units by mouth daily.   diltiazem (CARDIZEM LA) 180 MG 24 hr tablet Take 180 mg by mouth daily.   DULoxetine (CYMBALTA) 60 MG capsule Take 30 mg by mouth daily.   HYDROcodone-acetaminophen (NORCO) 10-325 MG tablet Take 1 tablet by mouth every 6 (six) hours as needed for moderate pain or severe pain.   levETIRAcetam (KEPPRA) 500 MG tablet TAKE 1 TABLET BY MOUTH TWICE A DAY   levothyroxine (SYNTHROID) 100 MCG tablet Take 88 mcg by mouth every morning.   losartan (COZAAR) 50 MG tablet Take 1 tablet by mouth daily.   magnesium oxide (MAG-OX) 400 (240 Mg) MG tablet Take 400 mg by mouth 2 (two) times daily.   nebivolol (BYSTOLIC) 5 MG tablet Take 2.5 mg by mouth in the morning and at bedtime.   nitroGLYCERIN (NITROSTAT) 0.4 MG SL tablet PLACE 1 TABLET (0.4 MG TOTAL) UNDER THE TONGUE EVERY 5 (FIVE) MINUTES AS NEEDED FOR CHEST PAIN.   pantoprazole (PROTONIX) 40 MG tablet Take 1 tablet (40 mg total) by mouth 2 (two) times daily.   potassium chloride SA (K-DUR,KLOR-CON) 20 MEQ tablet Take 20 mEq by mouth 2 (two) times daily.    [DISCONTINUED] ferrous sulfate 325 (65 FE) MG tablet Take 325 mg by mouth daily  with breakfast.     Allergies:   Patient has no known allergies.   Social  History   Socioeconomic History   Marital status: Married    Spouse name: Fritz Pickerel   Number of children: 0   Years of education: hs   Highest education level: Not on file  Occupational History   Occupation: Retired    Fish farm manager: RETIRED  Tobacco Use   Smoking status: Former    Packs/day: 0.30    Years: 3.00    Total pack years: 0.90    Types: Cigarettes    Start date: 11/08/1964    Quit date: 11/09/1967    Years since quitting: 54.3   Smokeless tobacco: Never  Vaping Use   Vaping Use: Never used  Substance and Sexual Activity   Alcohol use: No    Alcohol/week: 0.0 standard drinks of alcohol   Drug use: No   Sexual activity: Not on file  Other Topics Concern   Not on file  Social History Narrative   Lives at home, married   Left-handed   Daily caffeine use: coffee.   Social Determinants of Health   Financial Resource Strain: Not on file  Food Insecurity: No Food Insecurity (09/16/2021)   Hunger Vital Sign    Worried About Running Out of Food in the Last Year: Never true    Ran Out of Food in the Last Year: Never true  Transportation Needs: No Transportation Needs (09/16/2021)   PRAPARE - Hydrologist (Medical): No    Lack of Transportation (Non-Medical): No  Physical Activity: Not on file  Stress: Not on file  Social Connections: Not on file     Family History: The patient's family history includes Breast cancer in her niece and sister; CVA in her mother; Coronary artery disease in her father; Diabetes Mellitus I in her father; Emphysema in her father; Heart attack in her mother; Heart disease in her father; Hypertension in her father. There is no history of Colon cancer, Stomach cancer, Esophageal cancer, Rectal cancer, or Seizures.  ROS:   Please see the history of present illness.    ROS  All other systems reviewed and negative.   EKGs/Labs/Other Studies Reviewed:    The following studies were reviewed today: Lexiscan  myoview  Cardiac Cath 09/2019 Conclusion    Previously placed Mid RCA to Dist RCA stent (unknown type) is widely patent. Previously placed RPAV stent (unknown type) is widely patent. RPDA lesion is 40% stenosed. Previously placed Mid LAD stent (unknown type) is widely patent. 2nd Diag lesion is 40% stenosed. The left ventricular systolic function is normal. LV end diastolic pressure is normal. The left ventricular ejection fraction is 55-65% by visual estimate. There is no aortic valve stenosis.   Nonobstructive CAD and patent stents.     Small radial artery.  Tortuous right subclavian.  Can perform diagnostic cath from radial artery but it is small.  If PCI was needed, would consider femoral approach.    EKG:  EKG is ordered today and demonstrates NSR  Recent Labs: 03/12/2021: ALT 8 09/16/2021: Hemoglobin 13.4; Platelets 285 10/15/2021: BUN 17; Creatinine, Ser 1.26; Potassium 4.2; Sodium 142   Recent Lipid Panel    Component Value Date/Time   CHOL 138 09/16/2021 0500   CHOL 147 03/12/2021 0956   TRIG 110 09/16/2021 0500   HDL 56 09/16/2021 0500   HDL 63 03/12/2021 0956   CHOLHDL 2.5 09/16/2021 0500   VLDL 22 09/16/2021 0500  LDLCALC 60 09/16/2021 0500   LDLCALC 66 03/12/2021 0956    Physical Exam:    VS:  BP 112/70   Pulse 69   Ht 4' 11"$  (1.499 m)   Wt 122 lb 6.4 oz (55.5 kg)   SpO2 99%   BMI 24.72 kg/m     Wt Readings from Last 3 Encounters:  03/02/22 122 lb 6.4 oz (55.5 kg)  02/02/22 124 lb 6.4 oz (56.4 kg)  09/30/21 124 lb (56.2 kg)    GEN: Well nourished, well developed in no acute distress HEENT: Normal NECK: No JVD; No carotid bruits LYMPHATICS: No lymphadenopathy CARDIAC:RRR, no murmurs, rubs, gallops RESPIRATORY:  Clear to auscultation without rales, wheezing or rhonchi  ABDOMEN: Soft, non-tender, non-distended MUSCULOSKELETAL:  No edema; No deformity  SKIN: Warm and dry NEUROLOGIC:  Alert and oriented x 3 PSYCHIATRIC:  Normal affect   ASSESSMENT:    1. Coronary atherosclerosis due to lipid rich plaque   2. Primary hypertension   3. Mild aortic stenosis   4. Orthostatic hypotension   5. Paroxysmal SVT (supraventricular tachycardia)   6. Pericardial cyst   7. Pure hypercholesterolemia    PLAN:    In order of problems listed above:  1.  ASCAD  -s/p inf-post MI 2011 s/p DES to RCA.  -recurrent CP s/p DES to RCA 06/2010  -recurrent CP s/p  DES to LAD 2014 with residual dz treated medically.   -she has a long hx of chronic noncardiac CP related to esophageal spasm and stricture.  -recurrent CP 08/2018 with no ischemia on nuclear stress test 09/2018 -cardiac cath 09/2019 showed patent stents and 40% RPDA and Diagonal -She denies any anginal chest pain -Continue prescription drug management with aspirin 81 mg daily, Crestor 20 mg daily, Bystolic 2.5 mg twice daily with as needed refills  2.  HTN -BP controlled on exam -Continue prescription drug management with Cardizem CD 180 mg daily, Losartan [id number] daily and Bystolic 2.5 mg twice daily with as needed refills-her nephrologist and PCP are managing her BP meds   3.  Mild AS  -very mild by echo 09/2021 with mean gradient 7.3 mmHg   4.  Orthostatic hypotension  -appears to be stable  -She denies any dizziness or recent syncope   5.  SVT  -She remains in normal sinus rhythm with minimal palpitations -Continue prescription drug manage meant with Cardizem CD [id number] mg daily, Bystolic 2.5 mg twice daily with as needed refills  6.  Pericardial Cyst -s/p resection   7.  Hyperlipidemia  -LDL goal less than 70.  -I have personally reviewed and interpreted outside labs performed by patient's PCP which showed LDL 69 and HDL 70 on 02/23/2022 -Continue prescription drug management with Crestor 40 mg daily with as needed refills    Medication Adjustments/Labs and Tests Ordered: Current medicines are reviewed at length with the patient today.  Concerns regarding medicines are  outlined above.  No orders of the defined types were placed in this encounter.  No orders of the defined types were placed in this encounter.   Signed, Fransico Him, MD  03/02/2022 10:40 AM    [place]

## 2022-03-03 DIAGNOSIS — F4321 Adjustment disorder with depressed mood: Secondary | ICD-10-CM | POA: Diagnosis not present

## 2022-03-03 DIAGNOSIS — Z634 Disappearance and death of family member: Secondary | ICD-10-CM | POA: Diagnosis not present

## 2022-03-11 DIAGNOSIS — Z634 Disappearance and death of family member: Secondary | ICD-10-CM | POA: Diagnosis not present

## 2022-03-11 DIAGNOSIS — F4321 Adjustment disorder with depressed mood: Secondary | ICD-10-CM | POA: Diagnosis not present

## 2022-03-13 ENCOUNTER — Other Ambulatory Visit: Payer: Self-pay | Admitting: Internal Medicine

## 2022-03-25 DIAGNOSIS — F4321 Adjustment disorder with depressed mood: Secondary | ICD-10-CM | POA: Diagnosis not present

## 2022-03-25 DIAGNOSIS — Z634 Disappearance and death of family member: Secondary | ICD-10-CM | POA: Diagnosis not present

## 2022-04-02 ENCOUNTER — Ambulatory Visit: Payer: PPO | Admitting: Cardiology

## 2022-04-07 DIAGNOSIS — F331 Major depressive disorder, recurrent, moderate: Secondary | ICD-10-CM | POA: Diagnosis not present

## 2022-04-13 ENCOUNTER — Telehealth: Payer: Self-pay | Admitting: Adult Health

## 2022-04-13 NOTE — Telephone Encounter (Signed)
I called pt. .  She said that she had seen her pcp this last week and told her to call us to get an appt. As she noted increased drawing of her face.  (This she said is her partial seizures).  She has not missed any of her keppra 500mg  po bid.  But she had missed her alprazolam 1mg  po bid for about a week and this did increase these episodes.  She said she is better since she has started back on this.  She has appt with her psychiatrist on Thursday this week.  She will let us know how that appt goes.  I offered 05-19-2022 at 0900 with MM/NP to see and she said that was early.  I will place on waitlist.  She is very nervous/anxious. (She lost her husband recently).  She has not started any new medications.  I placed on wailtlist for prior to 05-19-2022 date.  Pt ok with this.

## 2022-04-13 NOTE — Telephone Encounter (Signed)
Pt called. Stated she need to talk to nurse. Stated her seizures are coming back. Pt could explain to me what was going on with her.

## 2022-04-14 NOTE — Telephone Encounter (Signed)
Noted  

## 2022-04-14 NOTE — Telephone Encounter (Signed)
No additional recommendations. Let's see how she is doing since being back in the Alprazolam.

## 2022-04-15 DIAGNOSIS — F4321 Adjustment disorder with depressed mood: Secondary | ICD-10-CM | POA: Diagnosis not present

## 2022-04-15 DIAGNOSIS — Z634 Disappearance and death of family member: Secondary | ICD-10-CM | POA: Diagnosis not present

## 2022-04-29 DIAGNOSIS — F4321 Adjustment disorder with depressed mood: Secondary | ICD-10-CM | POA: Diagnosis not present

## 2022-04-29 DIAGNOSIS — Z634 Disappearance and death of family member: Secondary | ICD-10-CM | POA: Diagnosis not present

## 2022-05-13 DIAGNOSIS — N183 Chronic kidney disease, stage 3 unspecified: Secondary | ICD-10-CM | POA: Diagnosis not present

## 2022-05-13 DIAGNOSIS — I129 Hypertensive chronic kidney disease with stage 1 through stage 4 chronic kidney disease, or unspecified chronic kidney disease: Secondary | ICD-10-CM | POA: Diagnosis not present

## 2022-05-17 DIAGNOSIS — N1831 Chronic kidney disease, stage 3a: Secondary | ICD-10-CM | POA: Diagnosis not present

## 2022-05-17 DIAGNOSIS — D631 Anemia in chronic kidney disease: Secondary | ICD-10-CM | POA: Diagnosis not present

## 2022-05-17 DIAGNOSIS — I129 Hypertensive chronic kidney disease with stage 1 through stage 4 chronic kidney disease, or unspecified chronic kidney disease: Secondary | ICD-10-CM | POA: Diagnosis not present

## 2022-06-08 ENCOUNTER — Ambulatory Visit: Payer: No Typology Code available for payment source | Admitting: Internal Medicine

## 2022-06-10 DIAGNOSIS — I5032 Chronic diastolic (congestive) heart failure: Secondary | ICD-10-CM | POA: Diagnosis not present

## 2022-06-10 DIAGNOSIS — E1122 Type 2 diabetes mellitus with diabetic chronic kidney disease: Secondary | ICD-10-CM | POA: Diagnosis not present

## 2022-06-10 DIAGNOSIS — E7800 Pure hypercholesterolemia, unspecified: Secondary | ICD-10-CM | POA: Diagnosis not present

## 2022-06-10 DIAGNOSIS — E1165 Type 2 diabetes mellitus with hyperglycemia: Secondary | ICD-10-CM | POA: Diagnosis not present

## 2022-06-10 DIAGNOSIS — E039 Hypothyroidism, unspecified: Secondary | ICD-10-CM | POA: Diagnosis not present

## 2022-06-10 DIAGNOSIS — I130 Hypertensive heart and chronic kidney disease with heart failure and stage 1 through stage 4 chronic kidney disease, or unspecified chronic kidney disease: Secondary | ICD-10-CM | POA: Diagnosis not present

## 2022-06-10 DIAGNOSIS — K219 Gastro-esophageal reflux disease without esophagitis: Secondary | ICD-10-CM | POA: Diagnosis not present

## 2022-06-10 DIAGNOSIS — I25119 Atherosclerotic heart disease of native coronary artery with unspecified angina pectoris: Secondary | ICD-10-CM | POA: Diagnosis not present

## 2022-06-10 DIAGNOSIS — F411 Generalized anxiety disorder: Secondary | ICD-10-CM | POA: Diagnosis not present

## 2022-06-10 DIAGNOSIS — N1831 Chronic kidney disease, stage 3a: Secondary | ICD-10-CM | POA: Diagnosis not present

## 2022-06-10 DIAGNOSIS — F331 Major depressive disorder, recurrent, moderate: Secondary | ICD-10-CM | POA: Diagnosis not present

## 2022-07-22 DIAGNOSIS — E039 Hypothyroidism, unspecified: Secondary | ICD-10-CM | POA: Diagnosis not present

## 2022-07-22 DIAGNOSIS — E1165 Type 2 diabetes mellitus with hyperglycemia: Secondary | ICD-10-CM | POA: Diagnosis not present

## 2022-08-03 ENCOUNTER — Other Ambulatory Visit: Payer: Self-pay | Admitting: Internal Medicine

## 2022-08-09 DIAGNOSIS — N189 Chronic kidney disease, unspecified: Secondary | ICD-10-CM | POA: Diagnosis not present

## 2022-08-09 DIAGNOSIS — E1165 Type 2 diabetes mellitus with hyperglycemia: Secondary | ICD-10-CM | POA: Diagnosis not present

## 2022-08-09 DIAGNOSIS — E200 Idiopathic hypoparathyroidism: Secondary | ICD-10-CM | POA: Diagnosis not present

## 2022-08-09 DIAGNOSIS — I10 Essential (primary) hypertension: Secondary | ICD-10-CM | POA: Diagnosis not present

## 2022-08-09 DIAGNOSIS — E039 Hypothyroidism, unspecified: Secondary | ICD-10-CM | POA: Diagnosis not present

## 2022-08-23 DIAGNOSIS — H2513 Age-related nuclear cataract, bilateral: Secondary | ICD-10-CM | POA: Diagnosis not present

## 2022-08-23 DIAGNOSIS — H524 Presbyopia: Secondary | ICD-10-CM | POA: Diagnosis not present

## 2022-08-23 DIAGNOSIS — H53001 Unspecified amblyopia, right eye: Secondary | ICD-10-CM | POA: Diagnosis not present

## 2022-08-23 DIAGNOSIS — H25812 Combined forms of age-related cataract, left eye: Secondary | ICD-10-CM | POA: Diagnosis not present

## 2022-08-23 DIAGNOSIS — H35362 Drusen (degenerative) of macula, left eye: Secondary | ICD-10-CM | POA: Diagnosis not present

## 2022-08-23 DIAGNOSIS — H2589 Other age-related cataract: Secondary | ICD-10-CM | POA: Diagnosis not present

## 2022-08-23 DIAGNOSIS — H25013 Cortical age-related cataract, bilateral: Secondary | ICD-10-CM | POA: Diagnosis not present

## 2022-08-23 DIAGNOSIS — H25043 Posterior subcapsular polar age-related cataract, bilateral: Secondary | ICD-10-CM | POA: Diagnosis not present

## 2022-08-23 DIAGNOSIS — H18523 Epithelial (juvenile) corneal dystrophy, bilateral: Secondary | ICD-10-CM | POA: Diagnosis not present

## 2022-09-14 DIAGNOSIS — H2589 Other age-related cataract: Secondary | ICD-10-CM | POA: Diagnosis not present

## 2022-09-14 DIAGNOSIS — H25812 Combined forms of age-related cataract, left eye: Secondary | ICD-10-CM | POA: Diagnosis not present

## 2022-09-15 ENCOUNTER — Ambulatory Visit: Payer: No Typology Code available for payment source | Admitting: Internal Medicine

## 2022-11-04 DIAGNOSIS — E1165 Type 2 diabetes mellitus with hyperglycemia: Secondary | ICD-10-CM | POA: Diagnosis not present

## 2022-11-04 DIAGNOSIS — Z0000 Encounter for general adult medical examination without abnormal findings: Secondary | ICD-10-CM | POA: Diagnosis not present

## 2022-11-04 DIAGNOSIS — F324 Major depressive disorder, single episode, in partial remission: Secondary | ICD-10-CM | POA: Diagnosis not present

## 2022-11-04 DIAGNOSIS — D508 Other iron deficiency anemias: Secondary | ICD-10-CM | POA: Diagnosis not present

## 2022-11-04 DIAGNOSIS — N1831 Chronic kidney disease, stage 3a: Secondary | ICD-10-CM | POA: Diagnosis not present

## 2022-11-04 DIAGNOSIS — I25119 Atherosclerotic heart disease of native coronary artery with unspecified angina pectoris: Secondary | ICD-10-CM | POA: Diagnosis not present

## 2022-11-04 DIAGNOSIS — Z1331 Encounter for screening for depression: Secondary | ICD-10-CM | POA: Diagnosis not present

## 2022-11-04 DIAGNOSIS — E039 Hypothyroidism, unspecified: Secondary | ICD-10-CM | POA: Diagnosis not present

## 2022-11-04 DIAGNOSIS — E46 Unspecified protein-calorie malnutrition: Secondary | ICD-10-CM | POA: Diagnosis not present

## 2022-11-04 DIAGNOSIS — Z23 Encounter for immunization: Secondary | ICD-10-CM | POA: Diagnosis not present

## 2022-11-04 DIAGNOSIS — I10 Essential (primary) hypertension: Secondary | ICD-10-CM | POA: Diagnosis not present

## 2022-11-04 DIAGNOSIS — E7800 Pure hypercholesterolemia, unspecified: Secondary | ICD-10-CM | POA: Diagnosis not present

## 2022-11-05 ENCOUNTER — Other Ambulatory Visit: Payer: Self-pay | Admitting: Internal Medicine

## 2022-11-05 ENCOUNTER — Encounter: Payer: Self-pay | Admitting: Adult Health

## 2022-11-15 ENCOUNTER — Encounter (HOSPITAL_COMMUNITY): Payer: Self-pay | Admitting: Emergency Medicine

## 2022-11-15 ENCOUNTER — Emergency Department (HOSPITAL_COMMUNITY): Payer: No Typology Code available for payment source

## 2022-11-15 ENCOUNTER — Emergency Department (HOSPITAL_COMMUNITY)
Admission: EM | Admit: 2022-11-15 | Discharge: 2022-11-15 | Disposition: A | Discharge status: Home / Self Care | Payer: No Typology Code available for payment source | Attending: Emergency Medicine | Admitting: Emergency Medicine

## 2022-11-15 ENCOUNTER — Other Ambulatory Visit: Payer: Self-pay

## 2022-11-15 DIAGNOSIS — R7989 Other specified abnormal findings of blood chemistry: Secondary | ICD-10-CM | POA: Diagnosis not present

## 2022-11-15 DIAGNOSIS — Z7982 Long term (current) use of aspirin: Secondary | ICD-10-CM | POA: Insufficient documentation

## 2022-11-15 DIAGNOSIS — R4182 Altered mental status, unspecified: Secondary | ICD-10-CM | POA: Diagnosis not present

## 2022-11-15 DIAGNOSIS — Z79899 Other long term (current) drug therapy: Secondary | ICD-10-CM | POA: Diagnosis not present

## 2022-11-15 DIAGNOSIS — I2510 Atherosclerotic heart disease of native coronary artery without angina pectoris: Secondary | ICD-10-CM | POA: Insufficient documentation

## 2022-11-15 DIAGNOSIS — K8689 Other specified diseases of pancreas: Secondary | ICD-10-CM | POA: Diagnosis not present

## 2022-11-15 DIAGNOSIS — I10 Essential (primary) hypertension: Secondary | ICD-10-CM | POA: Insufficient documentation

## 2022-11-15 DIAGNOSIS — R634 Abnormal weight loss: Secondary | ICD-10-CM | POA: Diagnosis not present

## 2022-11-15 DIAGNOSIS — R531 Weakness: Secondary | ICD-10-CM | POA: Insufficient documentation

## 2022-11-15 DIAGNOSIS — N289 Disorder of kidney and ureter, unspecified: Secondary | ICD-10-CM | POA: Diagnosis not present

## 2022-11-15 DIAGNOSIS — R200 Anesthesia of skin: Secondary | ICD-10-CM | POA: Diagnosis not present

## 2022-11-15 DIAGNOSIS — N200 Calculus of kidney: Secondary | ICD-10-CM | POA: Diagnosis not present

## 2022-11-15 LAB — COMPREHENSIVE METABOLIC PANEL
ALT: 14 U/L (ref 0–44)
AST: 27 U/L (ref 15–41)
Albumin: 3.7 g/dL (ref 3.5–5.0)
Alkaline Phosphatase: 67 U/L (ref 38–126)
Anion gap: 13 (ref 5–15)
BUN: 20 mg/dL (ref 8–23)
CO2: 21 mmol/L — ABNORMAL LOW (ref 22–32)
Calcium: 8.8 mg/dL — ABNORMAL LOW (ref 8.9–10.3)
Chloride: 106 mmol/L (ref 98–111)
Creatinine, Ser: 1.74 mg/dL — ABNORMAL HIGH (ref 0.44–1.00)
GFR, Estimated: 30 mL/min — ABNORMAL LOW (ref >60)
Glucose, Bld: 92 mg/dL (ref 70–99)
Potassium: 4.5 mmol/L (ref 3.5–5.1)
Sodium: 140 mmol/L (ref 135–145)
Total Bilirubin: 0.9 mg/dL (ref <1.2)
Total Protein: 6.7 g/dL (ref 6.5–8.1)

## 2022-11-15 LAB — CBC
HCT: 41.6 % (ref 36.0–46.0)
Hemoglobin: 13.2 g/dL (ref 12.0–15.0)
MCH: 27.6 pg (ref 26.0–34.0)
MCHC: 31.7 g/dL (ref 30.0–36.0)
MCV: 87 fL (ref 80.0–100.0)
Platelets: 305 10*3/uL (ref 150–400)
RBC: 4.78 MIL/uL (ref 3.87–5.11)
RDW: 13.3 % (ref 11.5–15.5)
WBC: 10.2 10*3/uL (ref 4.0–10.5)
nRBC: 0 % (ref 0.0–0.2)

## 2022-11-15 LAB — URINALYSIS, ROUTINE W REFLEX MICROSCOPIC
Glucose, UA: NEGATIVE mg/dL
Ketones, ur: 5 mg/dL — AB
Leukocytes,Ua: NEGATIVE
Nitrite: NEGATIVE
Protein, ur: 100 mg/dL — AB
Specific Gravity, Urine: 1.027 (ref 1.005–1.030)
pH: 5 (ref 5.0–8.0)

## 2022-11-15 LAB — LACTIC ACID, PLASMA
Lactic Acid, Venous: 2 mmol/L (ref 0.5–1.9)
Lactic Acid, Venous: 1.8 mmol/L (ref 0.5–1.9)

## 2022-11-15 MED ORDER — SODIUM CHLORIDE 0.9 % IV BOLUS
500.0000 mL | Freq: Once | INTRAVENOUS | Status: AC
  Administered 2022-11-15: 500 mL via INTRAVENOUS

## 2022-11-15 MED ORDER — LACTATED RINGERS IV BOLUS: 500.0000 mL | Freq: Once | INTRAVENOUS | Status: DC

## 2022-11-15 MED ORDER — IOHEXOL 350 MG/ML SOLN
50.0000 mL | Freq: Once | INTRAVENOUS | Status: AC | PRN
  Administered 2022-11-15: 50 mL via INTRAVENOUS

## 2022-11-15 MED ORDER — SODIUM CHLORIDE 0.9 % IV SOLN
1.0000 g | Freq: Once | INTRAVENOUS | Status: AC
  Administered 2022-11-15: 1 g via INTRAVENOUS
  Filled 2022-11-15: qty 10

## 2022-11-15 NOTE — ED Notes (Signed)
Pt in NAD at d/c from ED. A&O. Ambulatory. Respirations even & unlabored. Skin warm & dry. Pt & pts family verbalized understanding of d/c teaching including follow up care and reasons to return to the ED. NO needs or questions expressed at d/c.

## 2022-11-15 NOTE — Discharge Instructions (Addendum)
Discussed MRI of your abdomen/liver with your primary doctor. Follow-up for recheck in 2 to 3 days. Return for new concerns.

## 2022-11-15 NOTE — ED Triage Notes (Signed)
Pt from home via [facility] with reports of bilateral leg numbness and generalized weakness. Pt reports her legs have been numb for 1 week. Denies pain at this time.

## 2022-11-15 NOTE — ED Provider Notes (Signed)
Patient care signed out to follow-up CT abdomen pelvis results.  Patient received IV antibiotics for possible UTI culture sent.  CT abdomen pelvis delayed however results reviewed independently and no acute findings.  Radiology did note possible abnormality in the liver and outpatient MRI discussed with patient and family member.  Patient well-appearing reassessment and comfortable going home.  Follow-up discussed and follow-up with primary doctor to recheck kidney function.   Blane Ohara, MD 11/15/22 2004

## 2022-11-15 NOTE — ED Notes (Signed)
Patient transported to CT 

## 2022-11-15 NOTE — ED Provider Notes (Signed)
[place] EMERGENCY DEPARTMENT AT [facility] Provider Note   CSN: [id number] Arrival date & time: 11/15/22  1050     History  Chief Complaint  Patient presents with   Weakness    [name] is a 77 y.o. female.  HPI 77 year old female history of coronary artery disease, hypertension, hyperlipidemia, depression, history of seizures, presents today with reports that she is having ongoing leg numbness and generalized weakness.  History is obtained partially from the patient and from her niece who is at bedside.  The patient was widowed in the past year and has been living alone.  She does continue to drive.  She has had significant weight loss.  She was seen by her primary care physician and this was thought to be secondary to poor p.o. intake.  Niece reports that she has been taking medications as prescribed.     Home Medications Prior to Admission medications   Medication Sig Start Date End Date Taking? Authorizing Provider  ALPRAZolam (XANAX) 0.25 MG tablet Take 0.25 mg by mouth 2 (two) times daily.     [provider]  ARIPiprazole (ABILIFY) 2 MG tablet Take 2 mg by mouth daily. 02/22/22   [provider]  aspirin EC 81 MG tablet Take 81 mg by mouth daily.    [provider]  calcitRIOL (ROCALTROL) 0.25 MCG capsule Take 0.25 mcg by mouth daily.    [provider]  CVS D3 50 MCG (2000 UT) CAPS Take 2,000 Units by mouth daily. 02/11/18   [provider]  diltiazem (CARDIZEM LA) 180 MG 24 hr tablet Take 1 tablet (180 mg total) by mouth daily. 03/02/22   Quintella Reichert, MD  DULoxetine (CYMBALTA) 60 MG capsule Take 30 mg by mouth daily.    [provider]  HYDROcodone-acetaminophen (NORCO) 10-325 MG tablet Take 1 tablet by mouth every 6 (six) hours as needed for moderate pain or severe pain.    [provider]  levETIRAcetam (KEPPRA) 500 MG tablet TAKE 1 TABLET BY MOUTH TWICE A DAY 12/30/21   Glean Salvo, NP   levothyroxine (SYNTHROID) 100 MCG tablet Take 88 mcg by mouth every morning. 09/09/21   [provider]  losartan (COZAAR) 50 MG tablet Take 1 tablet by mouth daily.    [provider]  magnesium oxide (MAG-OX) 400 (240 Mg) MG tablet Take 400 mg by mouth 2 (two) times daily.    [provider]  nebivolol (BYSTOLIC) 5 MG tablet Take 0.5 tablets (2.5 mg total) by mouth in the morning and at bedtime. 03/02/22   Quintella Reichert, MD  nitroGLYCERIN (NITROSTAT) 0.4 MG SL tablet PLACE 1 TABLET (0.4 MG TOTAL) UNDER THE TONGUE EVERY 5 (FIVE) MINUTES AS NEEDED FOR CHEST PAIN. 07/17/20   Quintella Reichert, MD  pantoprazole (PROTONIX) 40 MG tablet TAKE 1 TABLET (40 MG TOTAL) BY MOUTH 2 (TWO) TIMES DAILY. OFFICE VISIT FOR FURTHER REFILLS 11/05/22   Hilarie Fredrickson, MD  potassium chloride SA (K-DUR,KLOR-CON) 20 MEQ tablet Take 20 mEq by mouth 2 (two) times daily.  09/02/17   [provider]  rosuvastatin (CRESTOR) 40 MG tablet Take 1 tablet (40 mg total) by mouth daily. 03/02/22   Quintella Reichert, MD      Allergies    Patient has no known allergies.    Review of Systems   Review of Systems  Physical Exam Updated Vital Signs BP 126/81   Pulse 95   Temp 98.6 F (37  C) (Rectal)   Resp 18   Ht 1.499 m (4\' 11" )   Wt 57.5 kg   SpO2 100%   BMI 25.60 kg/m  Physical Exam Vitals and nursing note reviewed.  HENT:     Head: Normocephalic.     Right Ear: External ear normal.     Left Ear: External ear normal.     Nose: Nose normal.     Mouth/Throat:     Pharynx: Oropharynx is clear.  Eyes:     Pupils: Pupils are equal, round, and reactive to light.  Cardiovascular:     Rate and Rhythm: Normal rate and regular rhythm.     Pulses: Normal pulses.  Pulmonary:     Effort: Pulmonary effort is normal.  Abdominal:     General: Abdomen is flat.     Palpations: Abdomen is soft.  Musculoskeletal:        General: Normal range of motion.     Cervical back: Normal range of motion.   Skin:    General: Skin is warm.     Capillary Refill: Capillary refill takes less than 2 seconds.  Neurological:     General: No focal deficit present.     Mental Status: She is alert.  Psychiatric:        Mood and Affect: Mood normal.     ED Results / Procedures / Treatments   Labs (all labs ordered are listed, but only abnormal results are displayed) Labs Reviewed  COMPREHENSIVE METABOLIC PANEL - Abnormal; Notable for the following components:      Result Value   CO2 21 (*)    Creatinine, Ser 1.74 (*)    Calcium 8.8 (*)    GFR, Estimated 30 (*)    All other components within normal limits  LACTIC ACID, PLASMA - Abnormal; Notable for the following components:   Lactic Acid, Venous 2.0 (*)    All other components within normal limits  URINALYSIS, ROUTINE W REFLEX MICROSCOPIC - Abnormal; Notable for the following components:   Color, Urine AMBER (*)    APPearance CLOUDY (*)    Hgb urine dipstick MODERATE (*)    Bilirubin Urine SMALL (*)    Ketones, ur 5 (*)    Protein, ur 100 (*)    Bacteria, UA FEW (*)    All other components within normal limits  CBC  LACTIC ACID, PLASMA    EKG EKG Interpretation Date/Time:  Monday November 15 2022 11:03:06 EST Ventricular Rate:  87 PR Interval:  132 QRS Duration:  66 QT Interval:  374 QTC Calculation: 450 R Axis:   -50  Text Interpretation: Normal sinus rhythm Left axis deviation Anterior infarct , age undetermined Abnormal ECG When compared with ECG of 15-Sep-2021 09:53, PREVIOUS ECG IS PRESENT No significant change since last tracing Confirmed by Margarita Grizzle [phone]) on 11/15/2022 3:35:26 PM  Radiology No results found.  Procedures Procedures    Medications Ordered in ED Medications  lactated ringers bolus 500 mL (has no administration in time range)  cefTRIAXone (ROCEPHIN) 1 g in sodium chloride 0.9 % 100 mL IVPB (1 g Intravenous New Bag/Given 11/15/22 1528)    ED Course/ Medical Decision Making/ A&P                                  Medical Decision Making Amount and/or Complexity of Data Reviewed Labs: ordered. Radiology: ordered.   77 year old female who presents today complaining of  leg numbness and generalized weakness. Patient evaluated here with physical exam showed no evidence of focal deficits Patient was evaluated with lab work significant for normal CBC complete metabolic panel with creatinine elevated at 1.74 with previous 1.26 Urinalysis significant 21-50 white blood cells 6-10 red blood cells and few bacteria CT head and CT abdomen pelvis are pending  With patient's white blood cells in urine and some red blood cells patient had Rocephin 1 g IV given to treat for possible UTI She is given 500 cc of fluid CT head and abdomen pelvis are pending at this time Care discussed with Dr. Jodi Mourning       Final Clinical Impression(s) / ED Diagnoses Final diagnoses:  Weakness    Rx / DC Orders ED Discharge Orders     None         Margarita Grizzle, MD 11/15/22 1538

## 2022-11-18 ENCOUNTER — Other Ambulatory Visit: Payer: Self-pay

## 2022-11-18 ENCOUNTER — Emergency Department (HOSPITAL_BASED_OUTPATIENT_CLINIC_OR_DEPARTMENT_OTHER): Payer: No Typology Code available for payment source

## 2022-11-18 ENCOUNTER — Emergency Department (HOSPITAL_BASED_OUTPATIENT_CLINIC_OR_DEPARTMENT_OTHER): Payer: No Typology Code available for payment source | Admitting: Radiology

## 2022-11-18 ENCOUNTER — Encounter (HOSPITAL_BASED_OUTPATIENT_CLINIC_OR_DEPARTMENT_OTHER): Payer: Self-pay | Admitting: Emergency Medicine

## 2022-11-18 ENCOUNTER — Inpatient Hospital Stay (HOSPITAL_BASED_OUTPATIENT_CLINIC_OR_DEPARTMENT_OTHER)
Admission: EM | Admit: 2022-11-18 | Discharge: 2022-11-27 | DRG: 640 | Disposition: A | Discharge status: Skilled Nursing Facility | Payer: No Typology Code available for payment source | Attending: Internal Medicine | Admitting: Internal Medicine

## 2022-11-18 DIAGNOSIS — R1310 Dysphagia, unspecified: Secondary | ICD-10-CM | POA: Diagnosis not present

## 2022-11-18 DIAGNOSIS — N179 Acute kidney failure, unspecified: Secondary | ICD-10-CM | POA: Diagnosis not present

## 2022-11-18 DIAGNOSIS — Z8669 Personal history of other diseases of the nervous system and sense organs: Secondary | ICD-10-CM

## 2022-11-18 DIAGNOSIS — N390 Urinary tract infection, site not specified: Secondary | ICD-10-CM | POA: Diagnosis present

## 2022-11-18 DIAGNOSIS — F419 Anxiety disorder, unspecified: Secondary | ICD-10-CM | POA: Diagnosis present

## 2022-11-18 DIAGNOSIS — I252 Old myocardial infarction: Secondary | ICD-10-CM

## 2022-11-18 DIAGNOSIS — Q043 Other reduction deformities of brain: Secondary | ICD-10-CM

## 2022-11-18 DIAGNOSIS — Q048 Other specified congenital malformations of brain: Secondary | ICD-10-CM

## 2022-11-18 DIAGNOSIS — G8929 Other chronic pain: Secondary | ICD-10-CM | POA: Diagnosis present

## 2022-11-18 DIAGNOSIS — R569 Unspecified convulsions: Secondary | ICD-10-CM

## 2022-11-18 DIAGNOSIS — E876 Hypokalemia: Secondary | ICD-10-CM | POA: Diagnosis present

## 2022-11-18 DIAGNOSIS — R4182 Altered mental status, unspecified: Secondary | ICD-10-CM | POA: Diagnosis not present

## 2022-11-18 DIAGNOSIS — E871 Hypo-osmolality and hyponatremia: Secondary | ICD-10-CM | POA: Diagnosis not present

## 2022-11-18 DIAGNOSIS — E872 Acidosis, unspecified: Secondary | ICD-10-CM | POA: Diagnosis present

## 2022-11-18 DIAGNOSIS — Z66 Do not resuscitate: Secondary | ICD-10-CM | POA: Diagnosis present

## 2022-11-18 DIAGNOSIS — N3000 Acute cystitis without hematuria: Secondary | ICD-10-CM | POA: Diagnosis not present

## 2022-11-18 DIAGNOSIS — N1831 Chronic kidney disease, stage 3a: Secondary | ICD-10-CM | POA: Diagnosis present

## 2022-11-18 DIAGNOSIS — I2510 Atherosclerotic heart disease of native coronary artery without angina pectoris: Secondary | ICD-10-CM | POA: Diagnosis present

## 2022-11-18 DIAGNOSIS — M545 Low back pain, unspecified: Secondary | ICD-10-CM | POA: Diagnosis present

## 2022-11-18 DIAGNOSIS — E860 Dehydration: Principal | ICD-10-CM | POA: Diagnosis present

## 2022-11-18 DIAGNOSIS — Z8249 Family history of ischemic heart disease and other diseases of the circulatory system: Secondary | ICD-10-CM

## 2022-11-18 DIAGNOSIS — K224 Dyskinesia of esophagus: Secondary | ICD-10-CM | POA: Diagnosis present

## 2022-11-18 DIAGNOSIS — Z825 Family history of asthma and other chronic lower respiratory diseases: Secondary | ICD-10-CM

## 2022-11-18 DIAGNOSIS — R9389 Abnormal findings on diagnostic imaging of other specified body structures: Secondary | ICD-10-CM | POA: Diagnosis not present

## 2022-11-18 DIAGNOSIS — G40109 Localization-related (focal) (partial) symptomatic epilepsy and epileptic syndromes with simple partial seizures, not intractable, without status epilepticus: Secondary | ICD-10-CM | POA: Diagnosis present

## 2022-11-18 DIAGNOSIS — J8410 Pulmonary fibrosis, unspecified: Secondary | ICD-10-CM | POA: Diagnosis present

## 2022-11-18 DIAGNOSIS — N3001 Acute cystitis with hematuria: Secondary | ICD-10-CM | POA: Diagnosis not present

## 2022-11-18 DIAGNOSIS — I10 Essential (primary) hypertension: Secondary | ICD-10-CM | POA: Diagnosis present

## 2022-11-18 DIAGNOSIS — K219 Gastro-esophageal reflux disease without esophagitis: Secondary | ICD-10-CM | POA: Diagnosis present

## 2022-11-18 DIAGNOSIS — Z87442 Personal history of urinary calculi: Secondary | ICD-10-CM

## 2022-11-18 DIAGNOSIS — Z833 Family history of diabetes mellitus: Secondary | ICD-10-CM

## 2022-11-18 DIAGNOSIS — D329 Benign neoplasm of meninges, unspecified: Secondary | ICD-10-CM | POA: Diagnosis present

## 2022-11-18 DIAGNOSIS — R262 Difficulty in walking, not elsewhere classified: Secondary | ICD-10-CM | POA: Diagnosis not present

## 2022-11-18 DIAGNOSIS — Z87891 Personal history of nicotine dependence: Secondary | ICD-10-CM

## 2022-11-18 DIAGNOSIS — Z803 Family history of malignant neoplasm of breast: Secondary | ICD-10-CM

## 2022-11-18 DIAGNOSIS — R5383 Other fatigue: Secondary | ICD-10-CM | POA: Diagnosis not present

## 2022-11-18 DIAGNOSIS — E039 Hypothyroidism, unspecified: Secondary | ICD-10-CM | POA: Diagnosis present

## 2022-11-18 DIAGNOSIS — Z79890 Hormone replacement therapy (postmenopausal): Secondary | ICD-10-CM

## 2022-11-18 DIAGNOSIS — I3481 Nonrheumatic mitral (valve) annulus calcification: Secondary | ICD-10-CM | POA: Diagnosis not present

## 2022-11-18 DIAGNOSIS — Z7982 Long term (current) use of aspirin: Secondary | ICD-10-CM

## 2022-11-18 DIAGNOSIS — Z823 Family history of stroke: Secondary | ICD-10-CM

## 2022-11-18 DIAGNOSIS — F32A Depression, unspecified: Secondary | ICD-10-CM | POA: Diagnosis present

## 2022-11-18 DIAGNOSIS — I129 Hypertensive chronic kidney disease with stage 1 through stage 4 chronic kidney disease, or unspecified chronic kidney disease: Secondary | ICD-10-CM | POA: Diagnosis present

## 2022-11-18 DIAGNOSIS — Z79899 Other long term (current) drug therapy: Secondary | ICD-10-CM

## 2022-11-18 DIAGNOSIS — R531 Weakness: Secondary | ICD-10-CM | POA: Diagnosis not present

## 2022-11-18 DIAGNOSIS — Z751 Person awaiting admission to adequate facility elsewhere: Secondary | ICD-10-CM

## 2022-11-18 DIAGNOSIS — E8809 Other disorders of plasma-protein metabolism, not elsewhere classified: Secondary | ICD-10-CM | POA: Diagnosis not present

## 2022-11-18 DIAGNOSIS — Z955 Presence of coronary angioplasty implant and graft: Secondary | ICD-10-CM

## 2022-11-18 DIAGNOSIS — E785 Hyperlipidemia, unspecified: Secondary | ICD-10-CM | POA: Diagnosis present

## 2022-11-18 LAB — CBC WITH DIFFERENTIAL/PLATELET
Abs Immature Granulocytes: 0.02 10*3/uL (ref 0.00–0.07)
Basophils Absolute: 0.1 10*3/uL (ref 0.0–0.1)
Basophils Relative: 1 %
Eosinophils Absolute: 0 10*3/uL (ref 0.0–0.5)
Eosinophils Relative: 0 %
HCT: 40.8 % (ref 36.0–46.0)
Hemoglobin: 13.3 g/dL (ref 12.0–15.0)
Immature Granulocytes: 0 %
Lymphocytes Relative: 20 %
Lymphs Abs: 1.6 10*3/uL (ref 0.7–4.0)
MCH: 28.1 pg (ref 26.0–34.0)
MCHC: 32.6 g/dL (ref 30.0–36.0)
MCV: 86.3 fL (ref 80.0–100.0)
Monocytes Absolute: 0.7 10*3/uL (ref 0.1–1.0)
Monocytes Relative: 9 %
Neutro Abs: 5.6 10*3/uL (ref 1.7–7.7)
Neutrophils Relative %: 70 %
Platelets: 284 10*3/uL (ref 150–400)
RBC: 4.73 MIL/uL (ref 3.87–5.11)
RDW: 13.5 % (ref 11.5–15.5)
WBC: 7.9 10*3/uL (ref 4.0–10.5)
nRBC: 0 % (ref 0.0–0.2)

## 2022-11-18 LAB — COMPREHENSIVE METABOLIC PANEL
ALT: 28 U/L (ref 0–44)
AST: 68 U/L — ABNORMAL HIGH (ref 15–41)
Albumin: 4.5 g/dL (ref 3.5–5.0)
Alkaline Phosphatase: 63 U/L (ref 38–126)
Anion gap: 12 (ref 5–15)
BUN: 32 mg/dL — ABNORMAL HIGH (ref 8–23)
CO2: 25 mmol/L (ref 22–32)
Calcium: 9 mg/dL (ref 8.9–10.3)
Chloride: 103 mmol/L (ref 98–111)
Creatinine, Ser: 2.09 mg/dL — ABNORMAL HIGH (ref 0.44–1.00)
GFR, Estimated: 24 mL/min — ABNORMAL LOW (ref >60)
Glucose, Bld: 81 mg/dL (ref 70–99)
Potassium: 4.3 mmol/L (ref 3.5–5.1)
Sodium: 140 mmol/L (ref 135–145)
Total Bilirubin: 0.7 mg/dL (ref <1.2)
Total Protein: 7.7 g/dL (ref 6.5–8.1)

## 2022-11-18 LAB — URINALYSIS, W/ REFLEX TO CULTURE (INFECTION SUSPECTED)
Glucose, UA: NEGATIVE mg/dL
Nitrite: NEGATIVE
Protein, ur: 30 mg/dL — AB
Specific Gravity, Urine: 1.04 — ABNORMAL HIGH (ref 1.005–1.030)
WBC, UA: >50 WBC/hpf (ref 0–5)
pH: 6 (ref 5.0–8.0)

## 2022-11-18 MED ORDER — ACETAMINOPHEN 650 MG RE SUPP: 650.0000 mg | Freq: Four times a day (QID) | RECTAL | Status: DC | PRN

## 2022-11-18 MED ORDER — ASPIRIN 81 MG PO TBEC
81.0000 mg | DELAYED_RELEASE_TABLET | Freq: Every day | ORAL | Status: DC
  Administered 2022-11-18 – 2022-11-19 (×2): 81 mg via ORAL
  Filled 2022-11-18 (×2): qty 1

## 2022-11-18 MED ORDER — ALPRAZOLAM 0.5 MG PO TABS
0.2500 mg | ORAL_TABLET | Freq: Two times a day (BID) | ORAL | Status: DC
  Administered 2022-11-18 – 2022-11-19 (×3): 0.25 mg via ORAL
  Filled 2022-11-18 (×3): qty 1

## 2022-11-18 MED ORDER — ARIPIPRAZOLE 2 MG PO TABS
2.0000 mg | ORAL_TABLET | Freq: Every day | ORAL | Status: DC
  Administered 2022-11-18 – 2022-11-19 (×2): 2 mg via ORAL
  Filled 2022-11-18 (×2): qty 1

## 2022-11-18 MED ORDER — ONDANSETRON HCL 4 MG PO TABS: 4.0000 mg | ORAL_TABLET | Freq: Four times a day (QID) | ORAL | Status: DC | PRN

## 2022-11-18 MED ORDER — NEBIVOLOL HCL 5 MG PO TABS
2.5000 mg | ORAL_TABLET | Freq: Two times a day (BID) | ORAL | Status: DC
  Administered 2022-11-18 – 2022-11-19 (×3): 2.5 mg via ORAL
  Filled 2022-11-18 (×4): qty 1

## 2022-11-18 MED ORDER — LOSARTAN POTASSIUM 25 MG PO TABS
50.0000 mg | ORAL_TABLET | Freq: Every day | ORAL | Status: DC
  Filled 2022-11-18: qty 2

## 2022-11-18 MED ORDER — DULOXETINE HCL 30 MG PO CPEP
30.0000 mg | ORAL_CAPSULE | Freq: Every day | ORAL | Status: DC
  Administered 2022-11-18 – 2022-11-19 (×2): 30 mg via ORAL
  Filled 2022-11-18 (×2): qty 1

## 2022-11-18 MED ORDER — PANTOPRAZOLE SODIUM 40 MG PO TBEC
40.0000 mg | DELAYED_RELEASE_TABLET | Freq: Two times a day (BID) | ORAL | Status: DC
  Administered 2022-11-18 – 2022-11-19 (×2): 40 mg via ORAL
  Filled 2022-11-18 (×3): qty 1

## 2022-11-18 MED ORDER — SODIUM CHLORIDE 0.9 % IV SOLN: INTRAVENOUS | Status: AC

## 2022-11-18 MED ORDER — TRAZODONE HCL 50 MG PO TABS: 25.0000 mg | ORAL_TABLET | Freq: Every evening | ORAL | Status: DC | PRN

## 2022-11-18 MED ORDER — ROSUVASTATIN CALCIUM 20 MG PO TABS
40.0000 mg | ORAL_TABLET | Freq: Every day | ORAL | Status: DC
  Administered 2022-11-18 – 2022-11-19 (×2): 40 mg via ORAL
  Filled 2022-11-18 (×2): qty 4
  Filled 2022-11-18: qty 2
  Filled 2022-11-18: qty 1

## 2022-11-18 MED ORDER — HYDRALAZINE HCL 20 MG/ML IJ SOLN
5.0000 mg | Freq: Four times a day (QID) | INTRAMUSCULAR | Status: DC | PRN
  Administered 2022-11-21 (×2): 5 mg via INTRAVENOUS
  Filled 2022-11-18 (×2): qty 1

## 2022-11-18 MED ORDER — ACETAMINOPHEN 325 MG PO TABS
650.0000 mg | ORAL_TABLET | Freq: Four times a day (QID) | ORAL | Status: DC | PRN
  Administered 2022-11-21 – 2022-11-24 (×4): 650 mg via ORAL
  Filled 2022-11-18 (×4): qty 2

## 2022-11-18 MED ORDER — SODIUM CHLORIDE 0.9 % IV SOLN: 1.0000 g | INTRAVENOUS | Status: DC

## 2022-11-18 MED ORDER — LEVETIRACETAM 500 MG PO TABS
500.0000 mg | ORAL_TABLET | Freq: Two times a day (BID) | ORAL | Status: DC
  Administered 2022-11-18 – 2022-11-19 (×3): 500 mg via ORAL
  Filled 2022-11-18 (×3): qty 1

## 2022-11-18 MED ORDER — ENOXAPARIN SODIUM 30 MG/0.3ML IJ SOSY
30.0000 mg | PREFILLED_SYRINGE | INTRAMUSCULAR | Status: DC
  Administered 2022-11-18 – 2022-11-26 (×9): 30 mg via SUBCUTANEOUS
  Filled 2022-11-18 (×9): qty 0.3

## 2022-11-18 MED ORDER — SODIUM CHLORIDE 0.9 % IV SOLN
1.0000 g | Freq: Once | INTRAVENOUS | Status: AC
  Administered 2022-11-18: 1 g via INTRAVENOUS
  Filled 2022-11-18: qty 10

## 2022-11-18 MED ORDER — LEVOTHYROXINE SODIUM 88 MCG PO TABS
88.0000 ug | ORAL_TABLET | Freq: Every day | ORAL | Status: DC
Start: 2022-11-19 — End: 2022-11-19
  Administered 2022-11-19: 88 ug via ORAL
  Filled 2022-11-18: qty 1

## 2022-11-18 MED ORDER — CALCITRIOL 0.25 MCG PO CAPS
0.2500 ug | ORAL_CAPSULE | Freq: Every day | ORAL | Status: DC
  Administered 2022-11-19: 0.25 ug via ORAL
  Filled 2022-11-18: qty 1

## 2022-11-18 MED ORDER — ALBUTEROL SULFATE (2.5 MG/3ML) 0.083% IN NEBU: 2.5000 mg | INHALATION_SOLUTION | RESPIRATORY_TRACT | Status: DC | PRN

## 2022-11-18 MED ORDER — DILTIAZEM HCL ER 180 MG PO TB24
180.0000 mg | ORAL_TABLET | Freq: Every day | ORAL | Status: DC
  Administered 2022-11-18 – 2022-11-19 (×2): 180 mg via ORAL
  Filled 2022-11-18 (×3): qty 1

## 2022-11-18 MED ORDER — ONDANSETRON HCL 4 MG/2ML IJ SOLN: 4.0000 mg | Freq: Four times a day (QID) | INTRAMUSCULAR | Status: DC | PRN

## 2022-11-18 MED ORDER — LACTATED RINGERS IV BOLUS
1000.0000 mL | Freq: Once | INTRAVENOUS | Status: AC
  Administered 2022-11-18: 1000 mL via INTRAVENOUS

## 2022-11-18 NOTE — H&P (Addendum)
History and Physical  [name] ZOX:[id number] DOB: 1945-08-31 DOA: 11/18/2022  PCP: Merri Brunette, MD   Chief Complaint: Weakness, trouble swallowing  HPI: [name] is a 77 y.o. female with medical history significant for anxiety, CKD stage III, CAD, esophageal dysmotility and stricture due to GERD who is being admitted to the hospital with acute on chronic renal failure, UTI, and dysphagia.  Patient is not a very good historian, but apparently she presented to the emergency department on 11/4 with generalized weakness.  Apparently the patient was unfortunately widowed last year has continued to live on her own with the assistance of her niece and has been declining gradually.  In the emergency department on that day, workup showed evidence of abnormal urinalysis, as well as some slight renal insufficiency worse than baseline.  She was given a dose of IV Rocephin, and then discharged from the emergency department without urine culture obtained, and without antibiotics.  She went home and continued to not feel very well.  She has continued to be weak, denies any pain, dysuria, says she is not eating or drinking very much because she has poor appetite.  Also tells me that she has been having some trouble swallowing, she had esophageal dilation in January 2021 and has overall been doing well since that time.  However yesterday she says she was eating a peanut butter and jelly sandwich, was able to eat about two thirds of it and after that it got stuck in her throat and took some time to get down.  Additionally, states that she does not think she urinated all day yesterday.  Review of Systems: Please see HPI for pertinent positives and negatives. A complete 10 system review of systems are otherwise negative.  Past Medical History:  Diagnosis Date   Anxiety    Arthritis    Blood transfusion without reported diagnosis    childhood   Carpal tunnel syndrome, bilateral 07/31/2015   Cataract     Cervical spondylosis without myelopathy    Chronic chest pain    Chronic kidney disease    Chronic low back pain    Chronotropic incompetence    CKD (chronic kidney disease), stage III (HCC)    Coronary artery disease    a. inf-post MI 2011 s/p DES to RCA. b. DES to RCA 06/2010. c. DES to LAD 2014, residual dz treated medically.   Depression    DVT (deep venous thrombosis) (HCC)    left   Esophageal stricture    GERD (gastroesophageal reflux disease)    Gross hematuria    Heart murmur    History of esophageal dilatation    FOR STRIUCTURE   History of gout    History of hiatal hernia    History of kidney stones    Hyperlipidemia    Hypertension    Hypothyroidism    Internal hemorrhoids    Irritable bowel syndrome    Mild aortic stenosis    by echo 2018   Myocardial infarction (HCC)    Obesity    Orthostatic hypotension    Partial seizure disorder (HCC) NEUROLOGIST-- DR Anne Hahn   NOCTURNAL   PONV (postoperative nausea and vomiting)    hard time getting iv site-had to do neck stick 2 yrs ago   Pulmonary fibrosis (HCC)    S/P pericardial cyst excision    02-05-2013  benign   Seizures (HCC)    none in yrs   SVT (supraventricular tachycardia) (HCC)    Trigger finger, acquired  09/30/2015   Right middle finger   Vitamin D deficiency    Past Surgical History:  Procedure Laterality Date   BIOPSY OF MEDIASTINAL MASS N/A 02/05/2013   Procedure: RESECTION OF MEDIASTINAL MASS;  Surgeon: Kerin Perna, MD;  Location: [facility] OR;  Service: Thoracic;  Laterality: N/A;   CARDIAC CATHETERIZATION Right 01/24/2015   Procedure: right femoral ARTERIAL LINE INSERTION;  Surgeon: Kerin Perna, MD;  Location: [facility] OR;  Service: Thoracic;  Laterality: Right;   CARDIOVASCULAR STRESS TEST  11-22-2013  dr Eldridge Dace   normal lexiscan study/  no ischemia/  normal LVF and wall motion/ ef 81%   COLONOSCOPY  last one 2014   CORONARY ANGIOGRAM  03/10/2011   Procedure: CORONARY ANGIOGRAM;  Surgeon: Corky Crafts, MD;  Location: [facility] CATH LAB;  Service: Cardiovascular;;   CORONARY ANGIOPLASTY WITH STENT PLACEMENT  11-22-2009  dr Eldridge Dace   Acute inferoposterior MI/  ef 60%,  PCI with DES x1 to dRCA,  25%  mLAD   CORONARY ANGIOPLASTY WITH STENT PLACEMENT  06-26-2010  dr Verdis Prime   Cutting balloon angioplasty to dRCA with DES x1,  40% mLAD (non-obstructive CAD)   CYSTOSCOPY WITH RETROGRADE PYELOGRAM, URETEROSCOPY AND STENT PLACEMENT Right 02/13/2014   Procedure: CYSTOSCOPY WITH RETROGRADE PYELOGRAM, URETEROSCOPY AND STENT PLACEMENT;  Surgeon: Valetta Fuller, MD;  Location: [facility];  Service: Urology;  Laterality: Right;   ECTOPIC PREGNANCY SURGERY  YRS AGO   SALPINGECTOMY   ESOPHAGOGASTRODUODENOSCOPY N/A 02/15/2014   Procedure: ESOPHAGOGASTRODUODENOSCOPY (EGD);  Surgeon: Beverley Fiedler, MD;  Location: [facility];  Service: Endoscopy;  Laterality: N/A;   ESOPHAGOGASTRODUODENOSCOPY (EGD) WITH ESOPHAGEAL DILATION  05-20-2011   HOLMIUM LASER APPLICATION Right 02/13/2014   Procedure: HOLMIUM LASER APPLICATION;  Surgeon: Valetta Fuller, MD;  Location: [facility];  Service: Urology;  Laterality: Right;   LEFT HEART CATH AND CORONARY ANGIOGRAPHY N/A 09/12/2019   Procedure: LEFT HEART CATH AND CORONARY ANGIOGRAPHY;  Surgeon: Corky Crafts, MD;  Location: [facility] INVASIVE CV LAB;  Service: Cardiovascular;  Laterality: N/A;   LEFT HEART CATH AND CORONARY ANGIOGRAPHY N/A 09/16/2021   Procedure: LEFT HEART CATH AND CORONARY ANGIOGRAPHY;  Surgeon: Swazil[place], Peter M, MD;  Location: [facility] INVASIVE CV LAB;  Service: Cardiovascular;  Laterality: N/A;   LEFT HEART CATHETERIZATION WITH CORONARY ANGIOGRAM N/A 03/14/2012   Procedure: LEFT HEART CATHETERIZATION WITH CORONARY ANGIOGRAM;  Surgeon: Quintella Reichert, MD;  Location: [facility] CATH LAB;  Service: Cardiovascular;  Laterality: N/A;  Normal LM,  50% pLAD,  70% mLAD,  D2 50-70%, very tortuous LAD,  70-82% ostial LCFX,  50% in-stent restenosis of  dRCA  and mRCA  stent and 50-70% ostial PDA,  normal LVSF, ef 60%   LUNG BIOPSY Right 01/24/2015   Procedure: RIGHT LUNG BIOPSY;  Surgeon: Kerin Perna, MD;  Location: [facility] OR;  Service: Thoracic;  Laterality: Right;   MEDIASTERNOTOMY N/A 02/05/2013   Procedure: MEDIAN STERNOTOMY;  Surgeon: Kerin Perna, MD;  Location: [facility] OR;  Service: Thoracic;  Laterality: N/A;   MEDIASTERNOTOMY Left 02/05/2013   NEEDLE GUIDED EXCISION BREAST CALCIFICATIONS Right 08-09-2008   PERCUTANEOUS CORONARY STENT INTERVENTION (PCI-S) N/A 04/03/2012   Procedure: PERCUTANEOUS CORONARY STENT INTERVENTION (PCI-S);  Surgeon: Corky Crafts, MD;  Location: [facility] CATH LAB;  Service: Cardiovascular;  Laterality: N/A;   Successful PCI  mLAD with 2.75x12 Promus stent, postdilated to >0.80mm   THORACOTOMY Right 01/24/2015   Procedure: RIGHT MINI/LIMITED THORACOTOMY;  Surgeon: Kerin Perna, MD;  Location: [facility] OR;  Service: Thoracic;  Laterality: Right;   TRANSTHORACIC ECHOCARDIOGRAM  11-17-2011   grade I diastolic dysfunction/  ef 55-60%   VIDEO BRONCHOSCOPY N/A 01/24/2015   Procedure: RIGHT VIDEO BRONCHOSCOPY;  Surgeon: Kerin Perna, MD;  Location: [facility] OR;  Service: Thoracic;  Laterality: N/A;    Social History:  reports that she quit smoking about 55 years ago. Her smoking use included cigarettes. She started smoking about 58 years ago. She has a 0.9 pack-year smoking history. She has never used smokeless tobacco. She reports that she does not drink alcohol and does not use drugs.   No Known Allergies  Family History  Problem Relation Age of Onset   Heart attack Mother    CVA Mother    Heart disease Father    Hypertension Father    Emphysema Father    Coronary artery disease Father    Diabetes Mellitus I Father    Breast cancer Sister    Breast cancer Niece        niece   Colon cancer Neg Hx    Stomach cancer Neg Hx    Esophageal cancer Neg Hx    Rectal cancer Neg Hx    Seizures Neg Hx      Prior to Admission  medications   Medication Sig Start Date End Date Taking? Authorizing Provider  ALPRAZolam (XANAX) 0.25 MG tablet Take 0.25 mg by mouth 2 (two) times daily.    Yes [provider]  ARIPiprazole (ABILIFY) 2 MG tablet Take 2 mg by mouth daily. 02/22/22  Yes [provider]  aspirin EC 81 MG tablet Take 81 mg by mouth daily.   Yes [provider]  calcitRIOL (ROCALTROL) 0.25 MCG capsule Take 0.25 mcg by mouth daily.   Yes [provider]  CVS D3 50 MCG (2000 UT) CAPS Take 2,000 Units by mouth daily. 02/11/18  Yes [provider]  diltiazem (CARDIZEM LA) 180 MG 24 hr tablet Take 1 tablet (180 mg total) by mouth daily. 03/02/22  Yes Turner, Cornelious Bryant, MD  DULoxetine (CYMBALTA) 60 MG capsule Take 30 mg by mouth daily.   Yes [provider]  HYDROcodone-acetaminophen (NORCO) 10-325 MG tablet Take 1 tablet by mouth every 6 (six) hours as needed for moderate pain or severe pain.   Yes [provider]  levETIRAcetam (KEPPRA) 500 MG tablet TAKE 1 TABLET BY MOUTH TWICE A DAY 12/30/21  Yes Glean Salvo, NP  levothyroxine (SYNTHROID) 100 MCG tablet Take 88 mcg by mouth every morning. 09/09/21  Yes [provider]  losartan (COZAAR) 50 MG tablet Take 1 tablet by mouth daily.   Yes [provider]  magnesium oxide (MAG-OX) 400 (240 Mg) MG tablet Take 400 mg by mouth 2 (two) times daily.   Yes [provider]  moxifloxacin (VIGAMOX) 0.5 % ophthalmic solution PLEASE SEE ATTACHED FOR DETAILED DIRECTIONS   Yes [provider]  nebivolol (BYSTOLIC) 5 MG tablet Take 0.5 tablets (2.5 mg total) by mouth in the morning and at bedtime. 03/02/22  Yes Turner, Cornelious Bryant, MD  nitroGLYCERIN (NITROSTAT) 0.4 MG SL tablet PLACE 1 TABLET (0.4 MG TOTAL) UNDER THE TONGUE EVERY 5 (FIVE) MINUTES AS NEEDED FOR CHEST PAIN. 07/17/20  Yes Turner, Traci R, MD  pantoprazole (PROTONIX) 40 MG tablet TAKE 1 TABLET (40 MG TOTAL) BY MOUTH 2 (TWO) TIMES DAILY.  OFFICE VISIT FOR FURTHER REFILLS 11/05/22  Yes Hilarie Fredrickson, MD  potassium chloride SA (K-DUR,KLOR-CON) 20 MEQ tablet  Take 20 mEq by mouth 2 (two) times daily.  09/02/17  Yes [provider]  rosuvastatin (CRESTOR) 40 MG tablet Take 1 tablet (40 mg total) by mouth daily. 03/02/22  Yes Turner, Cornelious Bryant, MD  prednisoLONE acetate (PRED FORTE) 1 % ophthalmic suspension PLEASE SEE ATTACHED FOR DETAILED DIRECTIONS    [provider]    Physical Exam: BP (!) 162/101   Pulse 81   Temp 99.1 F (37.3 C) (Oral)   Resp 13   Ht 4\' 11"  (1.499 m)   Wt 44.9 kg   SpO2 100%   BMI 20.00 kg/m   General:  Alert, oriented, calm, in no acute distress elderly female looking slightly older than her stated age.  Overall she looks dehydrated, pleasant and cooperative and looks comfortable. Eyes: EOMI, clear conjuctivae, white sclerea Neck: supple, no masses, trachea mildline  Cardiovascular: RRR, no murmurs or rubs, no peripheral edema  Respiratory: clear to auscultation bilaterally, no wheezes, no crackles  Abdomen: soft, nontender, nondistended, normal bowel tones heard  Skin: dry, no rashes  Musculoskeletal: no joint effusions, normal range of motion  Psychiatric: appropriate affect, normal speech  Neurologic: extraocular muscles intact, clear speech, moving all extremities with intact sensorium         Labs on Admission:  Basic Metabolic Panel: Recent Labs  Lab 11/15/22 1106 11/18/22 0926  NA 140 140  K 4.5 4.3  CL 106 103  CO2 21* 25  GLUCOSE 92 81  BUN 20 32*  CREATININE 1.74* 2.09*  CALCIUM 8.8* 9.0   Liver Function Tests: Recent Labs  Lab 11/15/22 1106 11/18/22 0926  AST 27 68*  ALT 14 28  ALKPHOS 67 63  BILITOT 0.9 0.7  PROT 6.7 7.7  ALBUMIN 3.7 4.5   No results for input(s): "LIPASE", "AMYLASE" in the last 168 hours. No results for input(s): "AMMONIA" in the last 168 hours. CBC: Recent Labs  Lab 11/15/22 1106 11/18/22 0926  WBC 10.2 7.9  NEUTROABS  --   5.6  HGB 13.2 13.3  HCT 41.6 40.8  MCV 87.0 86.3  PLT 305 284   Cardiac Enzymes: No results for input(s): "CKTOTAL", "CKMB", "CKMBINDEX", "TROPONINI" in the last 168 hours.  BNP (last 3 results) No results for input(s): "BNP" in the last 8760 hours.  ProBNP (last 3 results) No results for input(s): "PROBNP" in the last 8760 hours.  CBG: No results for input(s): "GLUCAP" in the last 168 hours.  Radiological Exams on Admission: DG Chest 2 View  Result Date: 11/18/2022 CLINICAL DATA:  Generalized weakness and fatigue. EXAM: CHEST - 2 VIEW COMPARISON:  Chest radiograph 09/15/2021. FINDINGS: Unchanged mild elevation of the right hemidiaphragm. Annular calcifications of the mitral valve. Postoperative changes of median sternotomy and CABG. Clear lungs. No pleural effusion or pneumothorax. IMPRESSION: No evidence of acute cardiopulmonary disease. Electronically Signed   By: Orvan Falconer M.D.   On: 11/18/2022 09:52   CT Head Wo Contrast  Result Date: 11/18/2022 CLINICAL DATA:  Mental status change, unknown cause. Difficulty walking and difficulty swallowing. EXAM: CT HEAD WITHOUT CONTRAST TECHNIQUE: Contiguous axial images were obtained from the base of the skull through the vertex without intravenous contrast. RADIATION DOSE REDUCTION: This exam was performed according to the departmental dose-optimization program which includes automated exposure control, adjustment of the mA and/or kV according to patient size and/or use of iterative reconstruction technique. COMPARISON:  Head CT 11/15/2022. FINDINGS: Brain: No acute hemorrhage. Unchanged moderate chronic small-vessel disease. Cortical gray-white differentiation is preserved. Unchanged findings  of left perisylvian polymicrogyria. No hydrocephalus or extra-axial collection. No mass effect or midline shift. Vascular: No hyperdense vessel or unexpected calcification. Skull: No calvarial fracture or suspicious bone lesion. Skull base is  unremarkable. Sinuses/Orbits: No acute finding. Other: None. IMPRESSION: 1. No acute intracranial abnormality. 2. Unchanged moderate chronic small-vessel disease. 3. Unchanged findings of left perisylvian polymicrogyria. Electronically Signed   By: Orvan Falconer M.D.   On: 11/18/2022 09:50    Assessment/Plan [name] is a 77 y.o. female with medical history significant for anxiety, CKD stage III, CAD, esophageal dysmotility and stricture due to GERD who is being admitted to the hospital with acute on chronic renal failure, UTI, and dysphagia.   UTI-based on grossly abnormal urinalysis, though the patient does not have specific UTI related symptoms. -Inpatient admission to MedSurg -Continue empiric IV Rocephin -Follow-up urine culture obtained in the ER today  Acute on CKD stage III-baseline creatinine roughly 1.2, currently elevated likely due to ATN from dehydration.  Dehydration likely a result of UTI as above, or dysphagia/lack of appetite. -Encourage p.o. fluid intake, and hydrate gently with 1 L normal saline -Avoid nephrotoxins, and renally dose medications -Follow renal function with daily labs  GERD-oral Protonix  Lactic acidosis-likely due to dehydration, not meeting sepsis criteria.  Has improved with IV fluids.  Dysphagia-in the setting of history of esophageal dysmotility and esophageal stricture, with last dilation January 2021.  Follows with [facility] Dr. Marina Goodell as an outpatient.  Discussed briefly with Dr. Marina Goodell, who knows the patient well and states her symptoms are primarily due to dysmotility.  Does not need inpatient GI consult. -Full liquid diet for now - SLP evaluation, with barium swallow -Plan for outpatient follow-up with Dr. Marina Goodell  Hypertension-continue home Bystolic, Cardizem but hold Cozaar due to AKI.  IV hydralazine as needed for SBP greater than 160  Anxiety-continue home Xanax twice daily as well as Cymbalta  Seizure history-continue home  Keppra  Hyperlipidemia-Crestor  Hypothyroidism-Synthroid  DVT prophylaxis: Lovenox     Code Status: Limited: Do not attempt resuscitation (DNR) -DNR-LIMITED -Do Not Intubate/DNI   Consults called: Discussed with Dr. Marina Goodell as above  Admission status: The appropriate patient status for this patient is INPATIENT. Inpatient status is judged to be reasonable and necessary in order to provide the required intensity of service to ensure the patient's safety. The patient's presenting symptoms, physical exam findings, and initial radiographic and laboratory data in the context of their chronic comorbidities is felt to place them at high risk for further clinical deterioration. Furthermore, it is not anticipated that the patient will be medically stable for discharge from the hospital within 2 midnights of admission.    I certify that at the point of admission it is my clinical judgment that the patient will require inpatient hospital care spanning beyond 2 midnights from the point of admission due to high intensity of service, high risk for further deterioration and high frequency of surveillance required  Time spent: 59 minutes  Ephrem Carrick Sharlette Dense MD [facility] Pager [phone]  If 7PM-7AM, please contact night-coverage [email or web address] Password [email or web address]  11/18/2022, 1:34 PM

## 2022-11-18 NOTE — ED Notes (Signed)
Report given to [place] Rn at [facility]. [facility] notified.

## 2022-11-18 NOTE — ED Notes (Signed)
Spoke with patients niece Ladene Artist , she is updated and coming to get pts keys.

## 2022-11-18 NOTE — ED Provider Notes (Signed)
[place] EMERGENCY DEPARTMENT AT [facility] Provider Note   CSN: [id number] Arrival date & time: 11/18/22  1324     History  Chief Complaint  Patient presents with   Weakness    [name] is a 77 y.o. female.   Weakness 77 year old female history of of anxiety, depression, CKD, CAD, hypertension, hyperlipidemia, hypothyroidism here with generalized weakness.  Patient was seen in the ED November 4.  Workup then was notable for mild AKI and she was discharged home.  She states she lives alone her niece helps her but does not have help all the time.  Over the last week since Monday she has had difficulty getting up and today she woke up and could not get up on her own so came in.  She feels like both of her legs are weak and she feels weak all over.  No dizziness.  No nausea or vomiting.  She has had decreased p.o. intake, she states she is a history of esophageal tightening requiring dilation in her esophagus has been tight making it difficult to swallow.  She does not feel a presenting stuck but does have difficulty tolerating her medicines due to this.  No headaches.  No focal weakness or numbness otherwise just feels like her legs are weaker than normal.  No fevers or chills.  No chest pain or shortness of breath or abdominal pain.  Urinalysis at the time was concern for possible UTI.  She was given a dose of Rocephin but not discharged with antibiotics.  No urine culture.     Home Medications Prior to Admission medications   Medication Sig Start Date End Date Taking? Authorizing Provider  ALPRAZolam (XANAX) 0.25 MG tablet Take 0.25 mg by mouth 2 (two) times daily.    Yes [provider]  ARIPiprazole (ABILIFY) 2 MG tablet Take 2 mg by mouth daily. 02/22/22  Yes [provider]  aspirin EC 81 MG tablet Take 81 mg by mouth daily.   Yes [provider]  calcitRIOL (ROCALTROL) 0.25 MCG capsule Take 0.25 mcg by mouth daily.   Yes [provider]  [employer] D3 50 MCG (2000 UT) CAPS Take 2,000 Units by mouth daily. 02/11/18  Yes [provider]  diltiazem (CARDIZEM LA) 180 MG 24 hr tablet Take 1 tablet (180 mg total) by mouth daily. 03/02/22  Yes Turner, Cornelious Bryant, MD  DULoxetine (CYMBALTA) 60 MG capsule Take 30 mg by mouth daily.   Yes [provider]  HYDROcodone-acetaminophen (NORCO) 10-325 MG tablet Take 1 tablet by mouth every 6 (six) hours as needed for moderate pain or severe pain.   Yes [provider]  levETIRAcetam (KEPPRA) 500 MG tablet TAKE 1 TABLET BY MOUTH TWICE A DAY 12/30/21  Yes Glean Salvo, NP  levothyroxine (SYNTHROID) 100 MCG tablet Take 88 mcg by mouth every morning. 09/09/21  Yes [provider]  losartan (COZAAR) 50 MG tablet Take 1 tablet by mouth daily.   Yes [provider]  magnesium oxide (MAG-OX) 400 (240 Mg) MG tablet Take 400 mg by mouth 2 (two) times daily.   Yes [provider]  moxifloxacin (VIGAMOX) 0.5 % ophthalmic solution PLEASE SEE ATTACHED FOR DETAILED DIRECTIONS   Yes [provider]  nebivolol (BYSTOLIC) 5 MG tablet Take 0.5 tablets (2.5 mg total) by mouth in the morning and at bedtime. 03/02/22  Yes Turner, Cornelious Bryant, MD  nitroGLYCERIN (NITROSTAT) 0.4 MG SL tablet PLACE 1 TABLET (0.4 MG TOTAL) UNDER THE  TONGUE EVERY 5 (FIVE) MINUTES AS NEEDED FOR CHEST PAIN. 07/17/20  Yes Turner, Traci R, MD  pantoprazole (PROTONIX) 40 MG tablet TAKE 1 TABLET (40 MG TOTAL) BY MOUTH 2 (TWO) TIMES DAILY. OFFICE VISIT FOR FURTHER REFILLS 11/05/22  Yes Hilarie Fredrickson, MD  potassium chloride SA (K-DUR,KLOR-CON) 20 MEQ tablet Take 20 mEq by mouth 2 (two) times daily.  09/02/17  Yes [provider]  rosuvastatin (CRESTOR) 40 MG tablet Take 1 tablet (40 mg total) by mouth daily. 03/02/22  Yes Turner, Cornelious Bryant, MD  prednisoLONE acetate (PRED FORTE) 1 % ophthalmic suspension PLEASE SEE ATTACHED FOR DETAILED DIRECTIONS    [provider]       Allergies    Patient has no known allergies.    Review of Systems   Review of Systems  Neurological:  Positive for weakness.  Review of systems completed and notable as per HPI.  ROS otherwise negative.   Physical Exam Updated Vital Signs BP 133/79 (BP Location: Left Arm)   Pulse (!) 49   Temp 98.4 F (36.9 C) (Oral)   Resp 16   Ht 4\' 11"  (1.499 m)   Wt 44.9 kg   SpO2 (!) 72%   BMI 20.00 kg/m  Physical Exam Vitals and nursing note reviewed.  Constitutional:      General: She is not in acute distress.    Appearance: She is well-developed.  HENT:     Head: Normocephalic and atraumatic.     Nose: Nose normal.     Mouth/Throat:     Mouth: Mucous membranes are moist.     Pharynx: Oropharynx is clear.  Eyes:     Extraocular Movements: Extraocular movements intact.     Conjunctiva/sclera: Conjunctivae normal.     Pupils: Pupils are equal, round, and reactive to light.  Cardiovascular:     Rate and Rhythm: Normal rate and regular rhythm.     Heart sounds: No murmur heard. Pulmonary:     Effort: Pulmonary effort is normal. No respiratory distress.     Breath sounds: Normal breath sounds.  Abdominal:     Palpations: Abdomen is soft.     Tenderness: There is no abdominal tenderness. There is no guarding or rebound.  Musculoskeletal:        General: No swelling.     Cervical back: Neck supple.  Skin:    General: Skin is warm and dry.     Capillary Refill: Capillary refill takes less than 2 seconds.  Neurological:     General: No focal deficit present.     Mental Status: She is alert and oriented to person, place, and time. Mental status is at baseline.     Cranial Nerves: No cranial nerve deficit.     Comments: Diffusely tremulous.  Cranial nerves are intact.  Normal speech.  She has.  The bilateral upper and lower extremities, able to move antigravity without drift.  Normal sensation all extremities.  Psychiatric:        Mood and Affect: Mood normal.     ED  Results / Procedures / Treatments   Labs (all labs ordered are listed, but only abnormal results are displayed) Labs Reviewed  COMPREHENSIVE METABOLIC PANEL - Abnormal; Notable for the following components:      Result Value   BUN 32 (*)    Creatinine, Ser 2.09 (*)    AST 68 (*)    GFR, Estimated 24 (*)    All other components within normal limits  URINALYSIS, W/  REFLEX TO CULTURE (INFECTION SUSPECTED) - Abnormal; Notable for the following components:   APPearance CLOUDY (*)    Specific Gravity, Urine 1.040 (*)    Hgb urine dipstick LARGE (*)    Bilirubin Urine SMALL (*)    Ketones, ur TRACE (*)    Protein, ur 30 (*)    Leukocytes,Ua SMALL (*)    Bacteria, UA FEW (*)    All other components within normal limits  URINE CULTURE  CBC WITH DIFFERENTIAL/PLATELET  BASIC METABOLIC PANEL  CBC    EKG EKG Interpretation Date/Time:  Thursday November 18 2022 08:32:15 EST Ventricular Rate:  82 PR Interval:  136 QRS Duration:  83 QT Interval:  388 QTC Calculation: 454 R Axis:   -54  Text Interpretation: Sinus rhythm Atrial premature complexes No significant change since last tracing Confirmed by Fulton Reek [id number]) on 11/18/2022 8:38:52 AM  Radiology DG Chest 2 View  Result Date: 11/18/2022 CLINICAL DATA:  Generalized weakness and fatigue. EXAM: CHEST - 2 VIEW COMPARISON:  Chest radiograph 09/15/2021. FINDINGS: Unchanged mild elevation of the right hemidiaphragm. Annular calcifications of the mitral valve. Postoperative changes of median sternotomy and CABG. Clear lungs. No pleural effusion or pneumothorax. IMPRESSION: No evidence of acute cardiopulmonary disease. Electronically Signed   By: Orvan Falconer M.D.   On: 11/18/2022 09:52   CT Head Wo Contrast  Result Date: 11/18/2022 CLINICAL DATA:  Mental status change, unknown cause. Difficulty walking and difficulty swallowing. EXAM: CT HEAD WITHOUT CONTRAST TECHNIQUE: Contiguous axial images were obtained from the base of the  skull through the vertex without intravenous contrast. RADIATION DOSE REDUCTION: This exam was performed according to the departmental dose-optimization program which includes automated exposure control, adjustment of the mA and/or kV according to patient size and/or use of iterative reconstruction technique. COMPARISON:  Head CT 11/15/2022. FINDINGS: Brain: No acute hemorrhage. Unchanged moderate chronic small-vessel disease. Cortical gray-white differentiation is preserved. Unchanged findings of left perisylvian polymicrogyria. No hydrocephalus or extra-axial collection. No mass effect or midline shift. Vascular: No hyperdense vessel or unexpected calcification. Skull: No calvarial fracture or suspicious bone lesion. Skull base is unremarkable. Sinuses/Orbits: No acute finding. Other: None. IMPRESSION: 1. No acute intracranial abnormality. 2. Unchanged moderate chronic small-vessel disease. 3. Unchanged findings of left perisylvian polymicrogyria. Electronically Signed   By: Orvan Falconer M.D.   On: 11/18/2022 09:50    Procedures Procedures    Medications Ordered in ED Medications  aspirin EC tablet 81 mg (81 mg Oral Given 11/18/22 1628)  diltiazem (CARDIZEM LA) 24 hr tablet 180 mg (180 mg Oral Given 11/18/22 1605)  nebivolol (BYSTOLIC) tablet 2.5 mg (2.5 mg Oral Given 11/18/22 1606)  rosuvastatin (CRESTOR) tablet 40 mg (40 mg Oral Given 11/18/22 1605)  ALPRAZolam (XANAX) tablet 0.25 mg (0.25 mg Oral Given 11/18/22 1607)  ARIPiprazole (ABILIFY) tablet 2 mg (2 mg Oral Given 11/18/22 1605)  DULoxetine (CYMBALTA) DR capsule 30 mg (30 mg Oral Given 11/18/22 1605)  calcitRIOL (ROCALTROL) capsule 0.25 mcg (has no administration in time range)  levothyroxine (SYNTHROID) tablet 88 mcg (has no administration in time range)  pantoprazole (PROTONIX) EC tablet 40 mg (40 mg Oral Not Given 11/18/22 1605)  levETIRAcetam (KEPPRA) tablet 500 mg (500 mg Oral Given 11/18/22 1605)  enoxaparin (LOVENOX) injection 30 mg (30  mg Subcutaneous Given 11/18/22 1609)  cefTRIAXone (ROCEPHIN) 1 g in sodium chloride 0.9 % 100 mL IVPB (has no administration in time range)  acetaminophen (TYLENOL) tablet 650 mg (has no administration in time range)  Or  acetaminophen (TYLENOL) suppository 650 mg (has no administration in time range)  traZODone (DESYREL) tablet 25 mg (has no administration in time range)  ondansetron (ZOFRAN) tablet 4 mg (has no administration in time range)    Or  ondansetron (ZOFRAN) injection 4 mg (has no administration in time range)  albuterol (PROVENTIL) (2.5 MG/3ML) 0.083% nebulizer solution 2.5 mg (has no administration in time range)  hydrALAZINE (APRESOLINE) injection 5 mg (has no administration in time range)  0.9 %  sodium chloride infusion ( Intravenous Infusion Verify 11/18/22 1813)  lactated ringers bolus 1,000 mL (0 mLs Intravenous Stopped 11/18/22 1123)  cefTRIAXone (ROCEPHIN) 1 g in sodium chloride 0.9 % 100 mL IVPB (1 g Intravenous New Bag/Given 11/18/22 1127)    ED Course/ Medical Decision Making/ A&P                                 Medical Decision Making Amount and/or Complexity of Data Reviewed Labs: ordered. Radiology: ordered.  Risk Decision regarding hospitalization.   Medical Decision Making:   NORELLE RUNNION is a 77 y.o. female who presented to the ED today with 77 year old female here for generalized weakness, difficulty ambulating.  She endorsed bilateral leg weakness, no focal deficits on exam.  Here she is hypertensive.  She was recently seen for similar, had AKI has had continued poor p.o. intake since.  Also had possible UTI not discharged antibiotics unfortunate no culture.  Will repeat labs here, get CT head to rule out intracranial cause.   Patient placed on continuous vitals and telemetry monitoring while in ED which was reviewed periodically.  Reviewed and confirmed nursing documentation for past medical history, family history, social history.  Reassessment  and Plan:   Labwork.  Notable for worsening AKI, no leukocytosis.  Urinalysis concerning for infection, Rocephin ordered as well as cultures.  CT head and chest x-ray without acute abnormality.  On reassessment she is feeling somewhat better after fluids.  However given partially treated UTI as well as worsening AKI I discussed the patient with the hospitalist and she was admitted for further management.   Patient's presentation is most consistent with acute complicated illness / injury requiring diagnostic workup.           Final Clinical Impression(s) / ED Diagnoses Final diagnoses:  Acute cystitis with hematuria  AKI (acute kidney injury) [facility])    Rx / DC Orders ED Discharge Orders     None         Laurence Spates, MD 11/18/22 [phone]

## 2022-11-18 NOTE — ED Notes (Signed)
[name] picked up pts keys.

## 2022-11-18 NOTE — ED Notes (Signed)
Spoke with Fayrene Fearing in the lab to add on urine culture

## 2022-11-18 NOTE — ED Notes (Signed)
Attempted to call pt's niece to update her on the pts admission. No answer, message left for her to return call. Pt has a gown and her keys at bedside, hoping to have niece take keys.

## 2022-11-18 NOTE — ED Triage Notes (Signed)
Pt bib [facility], c/o difficulty walking and difficulty swallowing. Recent admit for same. PT AOx4

## 2022-11-18 NOTE — ED Notes (Signed)
Called for transport with [facility] spoke with         at 11:46a

## 2022-11-19 ENCOUNTER — Observation Stay (HOSPITAL_COMMUNITY): Payer: No Typology Code available for payment source

## 2022-11-19 DIAGNOSIS — E038 Other specified hypothyroidism: Secondary | ICD-10-CM | POA: Diagnosis not present

## 2022-11-19 DIAGNOSIS — E7800 Pure hypercholesterolemia, unspecified: Secondary | ICD-10-CM

## 2022-11-19 DIAGNOSIS — I10 Essential (primary) hypertension: Secondary | ICD-10-CM

## 2022-11-19 DIAGNOSIS — K219 Gastro-esophageal reflux disease without esophagitis: Secondary | ICD-10-CM

## 2022-11-19 DIAGNOSIS — F32A Depression, unspecified: Secondary | ICD-10-CM | POA: Diagnosis not present

## 2022-11-19 DIAGNOSIS — N179 Acute kidney failure, unspecified: Secondary | ICD-10-CM | POA: Diagnosis not present

## 2022-11-19 DIAGNOSIS — E876 Hypokalemia: Secondary | ICD-10-CM | POA: Diagnosis not present

## 2022-11-19 DIAGNOSIS — E860 Dehydration: Secondary | ICD-10-CM | POA: Diagnosis not present

## 2022-11-19 DIAGNOSIS — N3000 Acute cystitis without hematuria: Secondary | ICD-10-CM | POA: Diagnosis not present

## 2022-11-19 DIAGNOSIS — R1310 Dysphagia, unspecified: Secondary | ICD-10-CM | POA: Diagnosis not present

## 2022-11-19 DIAGNOSIS — R569 Unspecified convulsions: Secondary | ICD-10-CM | POA: Diagnosis not present

## 2022-11-19 LAB — BASIC METABOLIC PANEL
Anion gap: 11 (ref 5–15)
BUN: 23 mg/dL (ref 8–23)
CO2: 21 mmol/L — ABNORMAL LOW (ref 22–32)
Calcium: 7.4 mg/dL — ABNORMAL LOW (ref 8.9–10.3)
Chloride: 108 mmol/L (ref 98–111)
Creatinine, Ser: 1.49 mg/dL — ABNORMAL HIGH (ref 0.44–1.00)
GFR, Estimated: 36 mL/min — ABNORMAL LOW (ref >60)
Glucose, Bld: 87 mg/dL (ref 70–99)
Potassium: 2.9 mmol/L — ABNORMAL LOW (ref 3.5–5.1)
Sodium: 140 mmol/L (ref 135–145)

## 2022-11-19 LAB — URINE CULTURE: Culture: NO GROWTH

## 2022-11-19 LAB — CBC
HCT: 35 % — ABNORMAL LOW (ref 36.0–46.0)
Hemoglobin: 11 g/dL — ABNORMAL LOW (ref 12.0–15.0)
MCH: 28.5 pg (ref 26.0–34.0)
MCHC: 31.4 g/dL (ref 30.0–36.0)
MCV: 90.7 fL (ref 80.0–100.0)
Platelets: 199 10*3/uL (ref 150–400)
RBC: 3.86 MIL/uL — ABNORMAL LOW (ref 3.87–5.11)
RDW: 13.9 % (ref 11.5–15.5)
WBC: 7.8 10*3/uL (ref 4.0–10.5)
nRBC: 0 % (ref 0.0–0.2)

## 2022-11-19 LAB — MAGNESIUM: Magnesium: 0.9 mg/dL — CL (ref 1.7–2.4)

## 2022-11-19 MED ORDER — MAGNESIUM SULFATE 4 GM/100ML IV SOLN
4.0000 g | Freq: Once | INTRAVENOUS | Status: AC
  Administered 2022-11-19: 4 g via INTRAVENOUS
  Filled 2022-11-19: qty 100

## 2022-11-19 MED ORDER — POTASSIUM CHLORIDE 20 MEQ PO PACK
40.0000 meq | PACK | ORAL | Status: AC
  Administered 2022-11-19 (×2): 40 meq via ORAL
  Filled 2022-11-19 (×2): qty 2

## 2022-11-19 MED ORDER — LEVETIRACETAM IN NACL 500 MG/100ML IV SOLN
500.0000 mg | Freq: Two times a day (BID) | INTRAVENOUS | Status: DC
  Administered 2022-11-19 – 2022-11-21 (×5): 500 mg via INTRAVENOUS
  Filled 2022-11-19 (×7): qty 100

## 2022-11-19 MED ORDER — LORAZEPAM 2 MG/ML IJ SOLN
0.5000 mg | Freq: Two times a day (BID) | INTRAMUSCULAR | Status: DC | PRN
  Administered 2022-11-20: 0.5 mg via INTRAVENOUS
  Filled 2022-11-19: qty 1

## 2022-11-19 MED ORDER — SODIUM CHLORIDE 0.9 % IV SOLN
1.0000 g | INTRAVENOUS | Status: AC
  Administered 2022-11-19 – 2022-11-22 (×4): 1 g via INTRAVENOUS
  Filled 2022-11-19 (×4): qty 10

## 2022-11-19 MED ORDER — PANTOPRAZOLE SODIUM 40 MG IV SOLR
40.0000 mg | Freq: Two times a day (BID) | INTRAVENOUS | Status: DC
  Administered 2022-11-19 – 2022-11-21 (×4): 40 mg via INTRAVENOUS
  Filled 2022-11-19 (×4): qty 10

## 2022-11-19 MED ORDER — SODIUM CHLORIDE 0.9 % IV SOLN: INTRAVENOUS | Status: AC

## 2022-11-19 NOTE — Plan of Care (Signed)

## 2022-11-19 NOTE — Evaluation (Addendum)
Clinical/Bedside Swallow Evaluation Patient Details  Name: [name] MRN: [id number] Date of Birth: 05-24-1945  Today's Date: 11/19/2022 Time: SLP Start Time (ACUTE ONLY): 1810 SLP Stop Time (ACUTE ONLY): 1839 SLP Time Calculation (min) (ACUTE ONLY): 29 min  Past Medical History:  Past Medical History:  Diagnosis Date   Anxiety    Arthritis    Blood transfusion without reported diagnosis    childhood   Carpal tunnel syndrome, bilateral 07/31/2015   Cataract    Cervical spondylosis without myelopathy    Chronic chest pain    Chronic kidney disease    Chronic low back pain    Chronotropic incompetence    CKD (chronic kidney disease), stage III (HCC)    Coronary artery disease    a. inf-post MI 2011 s/p DES to RCA. b. DES to RCA 06/2010. c. DES to LAD 2014, residual dz treated medically.   Depression    DVT (deep venous thrombosis) (HCC)    left   Esophageal stricture    GERD (gastroesophageal reflux disease)    Gross hematuria    Heart murmur    History of esophageal dilatation    FOR STRIUCTURE   History of gout    History of hiatal hernia    History of kidney stones    Hyperlipidemia    Hypertension    Hypothyroidism    Internal hemorrhoids    Irritable bowel syndrome    Mild aortic stenosis    by echo 2018   Myocardial infarction (HCC)    Obesity    Orthostatic hypotension    Partial seizure disorder (HCC) NEUROLOGIST-- DR Anne Hahn   NOCTURNAL   PONV (postoperative nausea and vomiting)    hard time getting iv site-had to do neck stick 2 yrs ago   Pulmonary fibrosis (HCC)    S/P pericardial cyst excision    02-05-2013  benign   Seizures (HCC)    none in yrs   SVT (supraventricular tachycardia) (HCC)    Trigger finger, acquired 09/30/2015   Right middle finger   Vitamin D deficiency    Past Surgical History:  Past Surgical History:  Procedure Laterality Date   BIOPSY OF MEDIASTINAL MASS N/A 02/05/2013   Procedure: RESECTION OF MEDIASTINAL MASS;  Surgeon:  Kerin Perna, MD;  Location: [facility] OR;  Service: Thoracic;  Laterality: N/A;   CARDIAC CATHETERIZATION Right 01/24/2015   Procedure: right femoral ARTERIAL LINE INSERTION;  Surgeon: Kerin Perna, MD;  Location: [facility] OR;  Service: Thoracic;  Laterality: Right;   CARDIOVASCULAR STRESS TEST  11-22-2013  dr Eldridge Dace   normal lexiscan study/  no ischemia/  normal LVF and wall motion/ ef 81%   COLONOSCOPY  last one 2014   CORONARY ANGIOGRAM  03/10/2011   Procedure: CORONARY ANGIOGRAM;  Surgeon: Corky Crafts, MD;  Location: [facility] CATH LAB;  Service: Cardiovascular;;   CORONARY ANGIOPLASTY WITH STENT PLACEMENT  11-22-2009  dr Eldridge Dace   Acute inferoposterior MI/  ef 60%,  PCI with DES x1 to dRCA,  25%  mLAD   CORONARY ANGIOPLASTY WITH STENT PLACEMENT  06-26-2010  dr Verdis Prime   Cutting balloon angioplasty to dRCA with DES x1,  40% mLAD (non-obstructive CAD)   CYSTOSCOPY WITH RETROGRADE PYELOGRAM, URETEROSCOPY AND STENT PLACEMENT Right 02/13/2014   Procedure: CYSTOSCOPY WITH RETROGRADE PYELOGRAM, URETEROSCOPY AND STENT PLACEMENT;  Surgeon: Valetta Fuller, MD;  Location: [facility];  Service: Urology;  Laterality: Right;   ECTOPIC PREGNANCY SURGERY  YRS AGO   SALPINGECTOMY  ESOPHAGOGASTRODUODENOSCOPY N/A 02/15/2014   Procedure: ESOPHAGOGASTRODUODENOSCOPY (EGD);  Surgeon: Beverley Fiedler, MD;  Location: [facility];  Service: Endoscopy;  Laterality: N/A;   ESOPHAGOGASTRODUODENOSCOPY (EGD) WITH ESOPHAGEAL DILATION  05-20-2011   HOLMIUM LASER APPLICATION Right 02/13/2014   Procedure: HOLMIUM LASER APPLICATION;  Surgeon: Valetta Fuller, MD;  Location: [facility];  Service: Urology;  Laterality: Right;   LEFT HEART CATH AND CORONARY ANGIOGRAPHY N/A 09/12/2019   Procedure: LEFT HEART CATH AND CORONARY ANGIOGRAPHY;  Surgeon: Corky Crafts, MD;  Location: [facility] INVASIVE CV LAB;  Service: Cardiovascular;  Laterality: N/A;   LEFT HEART CATH AND CORONARY ANGIOGRAPHY N/A 09/16/2021    Procedure: LEFT HEART CATH AND CORONARY ANGIOGRAPHY;  Surgeon: Swazil[place], Peter M, MD;  Location: [facility] INVASIVE CV LAB;  Service: Cardiovascular;  Laterality: N/A;   LEFT HEART CATHETERIZATION WITH CORONARY ANGIOGRAM N/A 03/14/2012   Procedure: LEFT HEART CATHETERIZATION WITH CORONARY ANGIOGRAM;  Surgeon: Quintella Reichert, MD;  Location: [facility] CATH LAB;  Service: Cardiovascular;  Laterality: N/A;  Normal LM,  50% pLAD,  70% mLAD,  D2 50-70%, very tortuous LAD,  70-82% ostial LCFX,  50% in-stent restenosis of  dRCA and mRCA  stent and 50-70% ostial PDA,  normal LVSF, ef 60%   LUNG BIOPSY Right 01/24/2015   Procedure: RIGHT LUNG BIOPSY;  Surgeon: Kerin Perna, MD;  Location: [facility] OR;  Service: Thoracic;  Laterality: Right;   MEDIASTERNOTOMY N/A 02/05/2013   Procedure: MEDIAN STERNOTOMY;  Surgeon: Kerin Perna, MD;  Location: [facility] OR;  Service: Thoracic;  Laterality: N/A;   MEDIASTERNOTOMY Left 02/05/2013   NEEDLE GUIDED EXCISION BREAST CALCIFICATIONS Right 08-09-2008   PERCUTANEOUS CORONARY STENT INTERVENTION (PCI-S) N/A 04/03/2012   Procedure: PERCUTANEOUS CORONARY STENT INTERVENTION (PCI-S);  Surgeon: Corky Crafts, MD;  Location: [facility] CATH LAB;  Service: Cardiovascular;  Laterality: N/A;   Successful PCI  mLAD with 2.75x12 Promus stent, postdilated to >0.60mm   THORACOTOMY Right 01/24/2015   Procedure: RIGHT MINI/LIMITED THORACOTOMY;  Surgeon: Kerin Perna, MD;  Location: [facility] OR;  Service: Thoracic;  Laterality: Right;   TRANSTHORACIC ECHOCARDIOGRAM  11-17-2011   grade I diastolic dysfunction/  ef 55-60%   VIDEO BRONCHOSCOPY N/A 01/24/2015   Procedure: RIGHT VIDEO BRONCHOSCOPY;  Surgeon: Kerin Perna, MD;  Location: [facility] OR;  Service: Thoracic;  Laterality: N/A;   HPI:  Pt is a 77 yo female adm to [facility] with weakness, leg numbness, dysphagia and found to have UTI. Pt has PMH consistent with esophageal stricture requiring dilatations in the past (Dr Marina Goodell is her GI MD), ILD, depression, seizure d/o,  shortness of breath, hypothyroidism, coronary artery disease, hypertension, hyperlipidemia, depression, history of seizures.  MBS ordered - and after further chart review, given extensive history - esophagram was ordered.  SLP follow up for precautions, compensations for esophageal dysphagia if indicated.  Esophagram revealed mild dysmotility and barium tablet halted at distal region without clearance.    Assessment / Plan / Recommendation  Clinical Impression  Patient presents with no clinical indication of oropharyngeal dysphagia and has been diagnosed with mild dysmotility per esophagram conducted today.  She was observed consuming a small amount of graham cracker, ice cream, and tea.  Partially consumed full liquid tray on her table and patient denies issues with swallowing liquids. She does indicate that she feels he is ready to try some solids.    Patient states that dysphagia symptoms include sensing food particularly meat sticking in her mid to lower esophagus pills at the lower  region.  She report that she is mostly able to clear foods/pills by swallowing liquids.    Notable tremorous lingual and jaw movement which patient states has been ongoing for 4 years and has progressively gotten worse.    Patient does endorse symptoms that she is presenting with currently are consistent with prior to requiring esophageal dilatation.  Plan is for her to follow-up with her GI doctor as an outpatient.    Provided her with esophageal dysmotility compensation strategies in particular starting intake with liquids, considering warm liquids, masticating food to pulvertization and consuming several small frequent meals.  In addition given patient reported pill dysphagia advise she speak to her pharmacist or MD about potentially modifying medications to make it easier to swallow, whether that smaller pills, crushing medications, or considering suspension.    Recommend she consider maximizing liquid nutrition if  her dysphagia persist or where she is unable to tolerate solids.  Will advance patient's diet currently to dysphagia 2 and recommend further advancement as MD indicates.  Patient agreeable with plan, all education completed and no SLP follow-up indicated.  Thank you for this order SLP Visit Diagnosis: Dysphagia, unspecified (R13.10)    Aspiration Risk  Mild aspiration risk    Diet Recommendation Dysphagia 2 (Fine chop);Thin liquid (Advance as MD indicates)    Liquid Administration via: Straw;Cup Medication Administration: Other (Comment) (With ice cream per patient) Supervision: Patient able to self feed Compensations: Slow rate;Small sips/bites;Other (Comment) (Start intake with liquid) Postural Changes: Seated upright at 90 degrees;Remain upright for at least 30 minutes after po intake    Other  Recommendations Oral Care Recommendations: Oral care BID    Recommendations for follow up therapy are one component of a multi-disciplinary discharge planning process, led by the attending physician.  Recommendations may be updated based on patient status, additional functional criteria and insurance authorization.  Follow up Recommendations No SLP follow up      Assistance Recommended at Discharge    Functional Status Assessment    Frequency and Duration            Prognosis        Swallow Study   General Date of Onset: 11/19/22 HPI: Pt is a 77 yo female adm to [facility] with weakness, leg numbness, dysphagia and found to have UTI. Pt has PMH consistent with esophageal stricture requiring dilatations in the past (Dr Marina Goodell is her GI MD), ILD, depression, seizure d/o, shortness of breath, hypothyroidism, coronary artery disease, hypertension, hyperlipidemia, depression, history of seizures.  MBS ordered - and after further chart review, given extensive history - esophagram was ordered.  SLP follow up for precautions, compensations for esophageal dysphagia if indicated.  Esophagram revealed  mild dysmotility and barium tablet halted at distal region without clearance. Type of Study: Bedside Swallow Evaluation Previous Swallow Assessment: esophagram 11/19/2022 Diet Prior to this Study: Thin liquids (Level 0) Temperature Spikes Noted: No Respiratory Status: Room air History of Recent Intubation: No Behavior/Cognition: Alert;Cooperative;Pleasant mood Oral Cavity Assessment: Within Functional Limits Oral Care Completed by SLP: No Oral Cavity - Dentition: Adequate natural dentition Vision: Functional for self-feeding Self-Feeding Abilities: Able to feed self Patient Positioning: Upright in bed Baseline Vocal Quality: Normal Volitional Cough: Strong Volitional Swallow: Able to elicit    Oral/Motor/Sensory Function Overall Oral Motor/Sensory Function: Within functional limits   Ice Chips Ice chips: Not tested   Thin Liquid Thin Liquid: Within functional limits Presentation: Self Fed;Straw    Nectar Thick Nectar Thick Liquid: Not tested  Honey Thick Honey Thick Liquid: Not tested   Puree Puree: Within functional limits Presentation: Self Fed;Spoon   Solid     Solid: Within functional limits Presentation: Self Orvan July 11/19/2022,7:34 PM  Rolena Infante, MS [facility] SLP Acute Rehab Services Office [phone]

## 2022-11-19 NOTE — Progress Notes (Signed)
   11/19/22 1149  TOC Brief Assessment  Insurance and Status Reviewed  Patient has primary care physician Yes  Home environment has been reviewed Single family home  Prior level of function: independent/ modified independent  Prior/Current Home Services No current home services  Social Determinants of Health Reivew SDOH reviewed no interventions necessary  Readmission risk has been reviewed Yes  Transition of care needs transition of care needs identified, TOC will continue to follow    Code 44 complete. PT/OT consulted. TOC will follow

## 2022-11-19 NOTE — Evaluation (Signed)
Physical Therapy Evaluation Patient Details Name: [name] MRN: [id number] DOB: August 12, 1945 Today's Date: 11/19/2022  History of Present Illness  [name] is a 77 y.o. female admitted with acute on chronic renal failure, UTI, and dysphagia. PMH: anxiety, CKD stage III, CAD, esophageal dysmotility and stricture due to GERD  Clinical Impression  Pt admitted with above diagnosis. Pt from home, ind at baseline, drives, has SPC but doesn't use it, reports has niece and sister in law if needed, lives with dog [name] and reports "not many" falls this calendar year. Pt currently needing min A with mobility, verbal cues for hand placement and RW use with mobility. Pt with minor LOB with initial turn when exiting room into hallway, reports hasn't walked since being admitted to hospital and able to continue without LOB. Recommend HHPT at d/c. Pt currently with functional limitations due to the deficits listed below (see PT Problem List). Pt will benefit from acute skilled PT to increase their independence and safety with mobility to allow discharge.           If plan is discharge home, recommend the following: A little help with walking and/or transfers;A little help with bathing/dressing/bathroom;Assistance with cooking/housework;Help with stairs or ramp for entrance;Assist for transportation   Can travel by private vehicle        Equipment Recommendations Rolling walker (2 wheels)  Recommendations for Other Services       Functional Status Assessment Patient has had a recent decline in their functional status and demonstrates the ability to make significant improvements in function in a reasonable and predictable amount of time.     Precautions / Restrictions Precautions Precautions: Fall Restrictions Weight Bearing Restrictions: No      Mobility  Bed Mobility Overal bed mobility: Needs Assistance Bed Mobility: Supine to Sit     Supine to sit: Min assist, HOB elevated, Used  rails     General bed mobility comments: min A to upright into sitting and scoot out to EOB    Transfers Overall transfer level: Needs assistance Equipment used: None Transfers: Sit to/from Stand Sit to Stand: Min assist           General transfer comment: min A to power up and steady, pt BLE braced against front of bed, provided RW once standing due to posterior lean in static standing    Ambulation/Gait Ambulation/Gait assistance: Min assist, Contact guard assist Gait Distance (Feet): 200 Feet Assistive device: Rolling walker (2 wheels) Gait Pattern/deviations: Step-through pattern, Decreased stride length, Trunk flexed, Narrow base of support Gait velocity: decreased     General Gait Details: step through gait pattern with narrow BOS, trunk forward flexed, min A-CGA for general unsteadiness and needing assist with minor LOB when turning  Stairs            Wheelchair Mobility     Tilt Bed    Modified Rankin (Stroke Patients Only)       Balance Overall balance assessment: Mild deficits observed, not formally tested, History of Falls                                           Pertinent Vitals/Pain Pain Assessment Pain Assessment: No/denies pain    Home Living Family/patient expects to be discharged to:: Private residence Living Arrangements: Alone Available Help at Discharge: Family;Neighbor;Available PRN/intermittently Type of Home: House Home Access: Stairs to enter  Entrance Stairs-Number of Steps: 5 back door with R handrail, 2 front foor with bil handrails   Home Layout: One level Home Equipment: Cane - single point      Prior Function Prior Level of Function : Independent/Modified Independent;Driving             Mobility Comments: pt reports ind without AD, reports tried [facility] but feels unsteady, reports "not many" falls this year ADLs Comments: pt reports ind with ALDs/IADLs     Extremity/Trunk Assessment   Upper  Extremity Assessment Upper Extremity Assessment: Defer to OT evaluation    Lower Extremity Assessment Lower Extremity Assessment: Generalized weakness    Cervical / Trunk Assessment Cervical / Trunk Assessment: Kyphotic  Communication   Communication Communication: Hearing impairment  Cognition Arousal: Alert Behavior During Therapy: WFL for tasks assessed/performed Overall Cognitive Status: Within Functional Limits for tasks assessed                                 General Comments: pt pleasant, HOH        General Comments      Exercises     Assessment/Plan    PT Assessment Patient needs continued PT services  PT Problem List Decreased strength;Decreased range of motion;Decreased activity tolerance;Decreased balance;Decreased mobility;Decreased knowledge of use of DME;Decreased safety awareness       PT Treatment Interventions DME instruction;Gait training;Stair training;Functional mobility training;Therapeutic activities;Therapeutic exercise;Balance training;Patient/family education    PT Goals (Current goals can be found in the Care Plan section)  Acute Rehab PT Goals Patient Stated Goal: home with dog [name] PT Goal Formulation: With patient Time For Goal Achievement: 12/03/22 Potential to Achieve Goals: Good    Frequency Min 1X/week     Co-evaluation               AM-PAC PT "6 Clicks" Mobility  Outcome Measure Help needed turning from your back to your side while in a flat bed without using bedrails?: A Little Help needed moving from lying on your back to sitting on the side of a flat bed without using bedrails?: A Little Help needed moving to and from a bed to a chair (including a wheelchair)?: A Little Help needed standing up from a chair using your arms (e.g., wheelchair or bedside chair)?: A Little Help needed to walk in hospital room?: A Little Help needed climbing 3-5 steps with a railing? : A Lot 6 Click Score: 17    End of  Session Equipment Utilized During Treatment: Gait belt Activity Tolerance: Patient tolerated treatment well Patient left: in chair;with call bell/phone within reach Nurse Communication: Mobility status PT Visit Diagnosis: Unsteadiness on feet (R26.81);Muscle weakness (generalized) (M62.81)    Time: 7829-5621 PT Time Calculation (min) (ACUTE ONLY): 22 min   Charges:   PT Evaluation $PT Eval Low Complexity: 1 Low   PT General Charges $$ ACUTE PT VISIT: 1 Visit         Tori Desi Carby PT, DPT 11/19/22, 3:32 PM

## 2022-11-19 NOTE — Care Management Obs Status (Signed)
MEDICARE OBSERVATION STATUS NOTIFICATION   Patient Details  Name: [name] MRN: [id number] Date of Birth: [birth year]   Medicare Observation Status Notification Given:  Yes    Otelia Santee, LCSW 11/19/2022, 11:16 AM

## 2022-11-19 NOTE — Evaluation (Signed)
SLP Cancellation Note  Patient Details Name: [name] MRN: [id number] DOB: [birth year]   Cancelled treatment:       Reason Eval/Treat Not Completed: Other (comment) (Upon chart review, pt has h/o extensive esophageal dysphagia, alerted MD who advised would change to esophagram; SLP will follow up clinically following if indicated)    Rolena Infante, MS [facility] SLP Acute Rehab Services Office [phone]  Chales Abrahams 11/19/2022, 7:57 AM

## 2022-11-19 NOTE — Progress Notes (Signed)
PROGRESS NOTE    [name]  MVH:[id number] DOB: Dec 27, 1945 DOA: 11/18/2022 PCP: Merri Brunette, MD (Confirm with patient/family/NH records and if not entered, this HAS to be entered at [facility] point of entry. "No PCP" if truly none.)   Chief Complaint  Patient presents with   Weakness    Brief Narrative:  Patient 77 year old female history of anxiety, CKD stage III, CAD, esophageal dysmotility and stricture due to GERD presented to the ED with acute on chronic renal failure UTI and dysphagia.   Assessment & Plan:   Principal Problem:   UTI (urinary tract infection) Active Problems:   Convulsions/seizures (HCC)   Hypertension   Hyperlipidemia   Hypothyroidism   History of  seizures   Depression   GERD   Acute renal failure (HCC)   Hypokalemia   Dehydration   Hypomagnesemia  #1 UTI -Patient noted on urinalysis was cloudy, large hemoglobin, trace ketones, small leukocytes, nitrite negative, few bacteria, WBC >50. -Patient recently seen in the ED received a dose of IV Rocephin for presumed UTI, urine cultures were not obtained and patient was not sent out on further antibiotics at that time. -Urine cultures obtained with no growth to date however patient did receive IV antibiotics a few days ago when she presented to the ED. -Continue IV Rocephin, and treat empirically for 5 days.  2.  Acute kidney injury on CKD stage IIIa -Patient noted on admission with creatinine of 2.09 with baseline creatinine of approximately 1.2. -Likely secondary to prerenal azotemia in the setting of dehydration and poor oral intake, in the setting of ARB. -Patient placed on IV fluids with improvement with renal function creatinine down to 1.49. -Continue to hold ARB. -Monitor urine output. -Continue IV fluids, supportive care.  3.  Dysphagia -Patient with history of esophageal dysmotility and esophageal stricture with last dilatation January 2021. -Patient noted to be followed by [facility],  Dr. Marina Goodell as outpatient. -Admitting physician, Dr. Kirby Crigler discussed briefly with Dr. Marina Goodell who knows the patient well and stated his symptoms were probably due to dysmotility and no inpatient GI consultation needed at this time. -Patient currently tolerating full liquid diet. -Will get an esophagram. -SLP evaluation. -If esophagram is abnormal we will formally consult with GI for further evaluation and management. -Follow-up.  4.  Hypokalemia/hypomagnesemia -Potassium at 2.9 this morning, magnesium at 0.9. -Patient noted to be on oral potassium and magnesium supplementation prior to admission. -Potassium packet 40 mEq p.o. every 4 hours x 2 doses. -Repeat labs in the AM.  5.  GERD -PPI.  6.  Lactic acidosis -Secondary to dehydration. -Improved with IV fluids.  7.  Hypertension -Continue home regimen Bystolic, Cardizem. -Continue to hold ARB due to AKI. -IV hydralazine as needed.  8.  Anxiety -Continue home regimen Cymbalta, Xanax.  9.  Seizure history -Keppra.  10.  Hyperlipidemia -Continue statin.  11.  Hypothyroidism -Synthroid.   DVT prophylaxis: Lovenox Code Status: DNR Family Communication: Updated patient.  No family at bedside. Disposition: TBD  Status is: Observation The patient remains OBS appropriate and will d/c before 2 midnights.   Consultants:  None  Procedures:  CT head 11/18/2022 Chest x-ray 11/18/2022   Antimicrobials:  Anti-infectives (From admission, onward)    Start     Dose/Rate Route Frequency Ordered Stop   11/19/22 1100  cefTRIAXone (ROCEPHIN) 1 g in sodium chloride 0.9 % 100 mL IVPB  Status:  Discontinued        1 g 200 mL/hr over 30 Minutes Intravenous  Every 24 hours 11/18/22 1333 11/19/22 0905   11/19/22 1100  cefTRIAXone (ROCEPHIN) 1 g in sodium chloride 0.9 % 100 mL IVPB        1 g 200 mL/hr over 30 Minutes Intravenous Every 24 hours 11/19/22 0905 11/23/22 1059   11/18/22 1115  cefTRIAXone (ROCEPHIN) 1 g in sodium  chloride 0.9 % 100 mL IVPB        1 g 200 mL/hr over 30 Minutes Intravenous  Once 11/18/22 1114 11/18/22 1157         Subjective: Patient laying in bed seems to be tolerating full liquids.  Denies any chest pain or shortness of breath.  Denies any abdominal pain.  Overall feeling better than she did when she was admitted.  Asking when she is going to be able to go home.  Objective: Vitals:   11/18/22 2223 11/19/22 0252 11/19/22 1439 11/19/22 1922  BP: 135/72 (!) 145/73 119/64 115/61  Pulse: 70 (!) 58 (!) 55 (!) 53  Resp:      Temp: 97.8 F (36.6 C) 97.6 F (36.4 C) 98.5 F (36.9 C) 98 F (36.7 C)  TempSrc: Oral Oral  Oral  SpO2: 100% 100% 98% 100%  Weight:      Height:        Intake/Output Summary (Last 24 hours) at 11/19/2022 1923 Last data filed at 11/19/2022 1547 Gross per 24 hour  Intake 340.92 ml  Output --  Net 340.92 ml   Filed Weights   11/18/22 0816 11/18/22 0831  Weight: 44.9 kg 44.9 kg    Examination:  General exam: Appears calm and comfortable.  Dry mucous membranes. Respiratory system: Clear to auscultation.  No wheezes, no crackles, no rhonchi.  Fair air movement.  Speaking in full sentences.  Respiratory effort normal. Cardiovascular system: S1 & S2 heard, RRR. No JVD, murmurs, rubs, gallops or clicks. No pedal edema. Gastrointestinal system: Abdomen is nondistended, soft and nontender. No organomegaly or masses felt. Normal bowel sounds heard. Central nervous system: Alert and oriented. No focal neurological deficits. Extremities: Symmetric 5 x 5 power. Skin: No rashes, lesions or ulcers Psychiatry: Judgement and insight appear fair. Mood & affect appropriate.     Data Reviewed: I have personally reviewed following labs and imaging studies  CBC: Recent Labs  Lab 11/15/22 1106 11/18/22 0926 11/19/22 0555  WBC 10.2 7.9 7.8  NEUTROABS  --  5.6  --   HGB 13.2 13.3 11.0*  HCT 41.6 40.8 35.0*  MCV 87.0 86.3 90.7  PLT 305 284 199    Basic  Metabolic Panel: Recent Labs  Lab 11/15/22 1106 11/18/22 0926 11/19/22 0555  NA 140 140 140  K 4.5 4.3 2.9*  CL 106 103 108  CO2 21* 25 21*  GLUCOSE 92 81 87  BUN 20 32* 23  CREATININE 1.74* 2.09* 1.49*  CALCIUM 8.8* 9.0 7.4*  MG  --   --  0.9*    GFR: Estimated Creatinine Clearance: 21.9 mL/min (A) (by C-G formula based on SCr of 1.49 mg/dL (H)).  Liver Function Tests: Recent Labs  Lab 11/15/22 1106 11/18/22 0926  AST 27 68*  ALT 14 28  ALKPHOS 67 63  BILITOT 0.9 0.7  PROT 6.7 7.7  ALBUMIN 3.7 4.5    CBG: No results for input(s): "GLUCAP" in the last 168 hours.   Recent Results (from the past 240 hour(s))  Urine Culture     Status: None   Collection Time: 11/18/22  8:52 AM   Specimen: Urine, Clean  Catch  Result Value Ref Range Status   Specimen Description   Final    URINE, CLEAN CATCH Performed at [facility], [address] [place], [place] [address]    Special Requests   Final    NONE Performed at [facility], [address] [place], [place] [address]    Culture   Final    NO GROWTH Performed at [facility], [address], [place], [place] [address]    Report Status 11/19/2022 FINAL  Final         Radiology Studies: DG ESOPHAGUS W SINGLE CM (SOL OR THIN BA)  Result Date: 11/19/2022 CLINICAL DATA:  77 year old inpatient with dysphagia. Bedside evaluation by speech pathology demonstrated esophageal dysphagia. History of remote esophageal dilatation (2007). EXAM: ESOPHOGRAM/BARIUM SWALLOW TECHNIQUE: Single contrast examination was performed using  thin barium. FLUOROSCOPY: Radiation Exposure Index (as provided by the fluoroscopic device): 10.2 mGy Kerma COMPARISON:  Chest radiographs 11/18/2022.  Chest CT 02/19/2019. FINDINGS: Patient has limited mobility and limited ability to bear weight. A limited single contrast examination was performed in the semi erect, supine and right lateral decubitus  positions. The patient swallowed the barium without difficulty. No laryngeal penetration or aspiration was observed during the examination. There is mild esophageal dysmotility with a mildly decreased primary stripping wave. There is smooth narrowing of the distal esophageal lumen without evidence of esophageal mass or significant hiatal hernia. This prevented the passage of a 13 mm barium tablet which was retained for 3-4 minutes despite multiple swallows of water and thin barium. Previous median sternotomy and prominent mitral annular calcifications are noted. IMPRESSION: 1. Limited single contrast examination due to patient mobility and limited ability to bear weight. 2. Mild esophageal dysmotility. 3. Smooth mild narrowing of the distal esophageal lumen preventing the passage of a 13 mm barium tablet, suspicious for a mild recurrent stricture. No mucosal irregularity identified. Electronically Signed   By: Carey Bullocks M.D.   On: 11/19/2022 12:29   DG Chest 2 View  Result Date: 11/18/2022 CLINICAL DATA:  Generalized weakness and fatigue. EXAM: CHEST - 2 VIEW COMPARISON:  Chest radiograph 09/15/2021. FINDINGS: Unchanged mild elevation of the right hemidiaphragm. Annular calcifications of the mitral valve. Postoperative changes of median sternotomy and CABG. Clear lungs. No pleural effusion or pneumothorax. IMPRESSION: No evidence of acute cardiopulmonary disease. Electronically Signed   By: Orvan Falconer M.D.   On: 11/18/2022 09:52   CT Head Wo Contrast  Result Date: 11/18/2022 CLINICAL DATA:  Mental status change, unknown cause. Difficulty walking and difficulty swallowing. EXAM: CT HEAD WITHOUT CONTRAST TECHNIQUE: Contiguous axial images were obtained from the base of the skull through the vertex without intravenous contrast. RADIATION DOSE REDUCTION: This exam was performed according to the departmental dose-optimization program which includes automated exposure control, adjustment of the mA  and/or kV according to patient size and/or use of iterative reconstruction technique. COMPARISON:  Head CT 11/15/2022. FINDINGS: Brain: No acute hemorrhage. Unchanged moderate chronic small-vessel disease. Cortical gray-white differentiation is preserved. Unchanged findings of left perisylvian polymicrogyria. No hydrocephalus or extra-axial collection. No mass effect or midline shift. Vascular: No hyperdense vessel or unexpected calcification. Skull: No calvarial fracture or suspicious bone lesion. Skull base is unremarkable. Sinuses/Orbits: No acute finding. Other: None. IMPRESSION: 1. No acute intracranial abnormality. 2. Unchanged moderate chronic small-vessel disease. 3. Unchanged findings of left perisylvian polymicrogyria. Electronically Signed   By: Orvan Falconer M.D.   On: 11/18/2022 09:50  Scheduled Meds:  ALPRAZolam  0.25 mg Oral BID   ARIPiprazole  2 mg Oral Daily   aspirin EC  81 mg Oral Daily   calcitRIOL  0.25 mcg Oral Daily   diltiazem  180 mg Oral Daily   DULoxetine  30 mg Oral Daily   enoxaparin (LOVENOX) injection  30 mg Subcutaneous Q24H   levETIRAcetam  500 mg Oral BID   levothyroxine  88 mcg Oral Q0600   nebivolol  2.5 mg Oral BID   pantoprazole  40 mg Oral BID   rosuvastatin  40 mg Oral Daily   Continuous Infusions:  sodium chloride Stopped (11/19/22 1410)   cefTRIAXone (ROCEPHIN)  IV Stopped (11/19/22 1242)     LOS: 1 day    Time spent: 40 minutes    Ramiro Harvest, MD [facility]   To contact the attending provider between 7A-7P or the covering provider during after hours 7P-7A, please log into the web site [email or web address] and access using universal [place] password for that web site. If you do not have the password, please call the hospital operator.  11/19/2022, 7:23 PM

## 2022-11-19 NOTE — Care Management CC44 (Signed)
Condition Code 44 Documentation Completed  Patient Details  Name: [name] MRN: [id number] Date of Birth: 27-Jul-1945   Condition Code 44 given:  Yes Patient signature on Condition Code 44 notice:  Yes Documentation of 2 MD's agreement:  Yes Code 44 added to claim:  Yes    Otelia Santee, LCSW 11/19/2022, 11:16 AM

## 2022-11-20 DIAGNOSIS — N3000 Acute cystitis without hematuria: Secondary | ICD-10-CM | POA: Diagnosis not present

## 2022-11-20 DIAGNOSIS — N179 Acute kidney failure, unspecified: Secondary | ICD-10-CM | POA: Diagnosis not present

## 2022-11-20 DIAGNOSIS — I10 Essential (primary) hypertension: Secondary | ICD-10-CM | POA: Diagnosis not present

## 2022-11-20 DIAGNOSIS — F32A Depression, unspecified: Secondary | ICD-10-CM | POA: Diagnosis not present

## 2022-11-20 LAB — MAGNESIUM: Magnesium: 2.2 mg/dL (ref 1.7–2.4)

## 2022-11-20 LAB — CBC
HCT: 44.9 % (ref 36.0–46.0)
Hemoglobin: 13.4 g/dL (ref 12.0–15.0)
MCH: 28.2 pg (ref 26.0–34.0)
MCHC: 29.8 g/dL — ABNORMAL LOW (ref 30.0–36.0)
MCV: 94.5 fL (ref 80.0–100.0)
Platelets: 189 10*3/uL (ref 150–400)
RBC: 4.75 MIL/uL (ref 3.87–5.11)
RDW: 13.9 % (ref 11.5–15.5)
WBC: 7.4 10*3/uL (ref 4.0–10.5)
nRBC: 0 % (ref 0.0–0.2)

## 2022-11-20 LAB — RENAL FUNCTION PANEL
Albumin: 3.2 g/dL — ABNORMAL LOW (ref 3.5–5.0)
Anion gap: 11 (ref 5–15)
BUN: 13 mg/dL (ref 8–23)
CO2: 20 mmol/L — ABNORMAL LOW (ref 22–32)
Calcium: 7.4 mg/dL — ABNORMAL LOW (ref 8.9–10.3)
Chloride: 111 mmol/L (ref 98–111)
Creatinine, Ser: 1.31 mg/dL — ABNORMAL HIGH (ref 0.44–1.00)
GFR, Estimated: 42 mL/min — ABNORMAL LOW (ref >60)
Glucose, Bld: 91 mg/dL (ref 70–99)
Phosphorus: 2.8 mg/dL (ref 2.5–4.6)
Potassium: 3.1 mmol/L — ABNORMAL LOW (ref 3.5–5.1)
Sodium: 142 mmol/L (ref 135–145)

## 2022-11-20 MED ORDER — CALCITRIOL 0.25 MCG PO CAPS
0.2500 ug | ORAL_CAPSULE | Freq: Every day | ORAL | Status: DC
Start: 2022-11-20 — End: 2022-11-27
  Administered 2022-11-20 – 2022-11-27 (×8): 0.25 ug via ORAL
  Filled 2022-11-20 (×8): qty 1

## 2022-11-20 MED ORDER — LEVOTHYROXINE SODIUM 88 MCG PO TABS
88.0000 ug | ORAL_TABLET | Freq: Every day | ORAL | Status: DC
Start: 2022-11-20 — End: 2022-11-27
  Administered 2022-11-20 – 2022-11-27 (×8): 88 ug via ORAL
  Filled 2022-11-20 (×9): qty 1

## 2022-11-20 MED ORDER — ALPRAZOLAM 0.25 MG PO TABS
0.2500 mg | ORAL_TABLET | Freq: Two times a day (BID) | ORAL | Status: DC
  Administered 2022-11-20 – 2022-11-27 (×15): 0.25 mg via ORAL
  Filled 2022-11-20 (×15): qty 1

## 2022-11-20 MED ORDER — ASPIRIN 81 MG PO TBEC
81.0000 mg | DELAYED_RELEASE_TABLET | Freq: Every day | ORAL | Status: DC
  Administered 2022-11-20 – 2022-11-27 (×8): 81 mg via ORAL
  Filled 2022-11-20 (×8): qty 1

## 2022-11-20 MED ORDER — ROSUVASTATIN CALCIUM 10 MG PO TABS
40.0000 mg | ORAL_TABLET | Freq: Every day | ORAL | Status: DC
  Administered 2022-11-20 – 2022-11-27 (×8): 40 mg via ORAL
  Filled 2022-11-20 (×8): qty 4

## 2022-11-20 MED ORDER — ARIPIPRAZOLE 2 MG PO TABS
2.0000 mg | ORAL_TABLET | Freq: Every day | ORAL | Status: DC
  Administered 2022-11-20 – 2022-11-27 (×8): 2 mg via ORAL
  Filled 2022-11-20 (×8): qty 1

## 2022-11-20 MED ORDER — ORAL CARE MOUTH RINSE: 15.0000 mL | OROMUCOSAL | Status: DC | PRN

## 2022-11-20 MED ORDER — ORAL CARE MOUTH RINSE
15.0000 mL | OROMUCOSAL | Status: DC
  Administered 2022-11-20 – 2022-11-27 (×24): 15 mL via OROMUCOSAL

## 2022-11-20 MED ORDER — POTASSIUM CHLORIDE 20 MEQ PO PACK
20.0000 meq | PACK | Freq: Two times a day (BID) | ORAL | Status: DC
  Administered 2022-11-20 – 2022-11-27 (×15): 20 meq via ORAL
  Filled 2022-11-20 (×15): qty 1

## 2022-11-20 MED ORDER — DULOXETINE HCL 30 MG PO CPEP
30.0000 mg | ORAL_CAPSULE | Freq: Every day | ORAL | Status: DC
  Administered 2022-11-20 – 2022-11-27 (×8): 30 mg via ORAL
  Filled 2022-11-20 (×8): qty 1

## 2022-11-20 NOTE — TOC Progression Note (Signed)
Transition of Care [facility]) - Progression Note    Patient Details  Name: [name] MRN: [id number] Date of Birth: [birth year]  [facility] CM/SW Contact  Georgie Chard, [place] Phone Number: 11/20/2022, 12:10 PM  Clinical Narrative:    CSW spoke to the patient's niece # [phone] per nurses request to call. The Patient's niece is concerned about the patient's DC. AT this time the patient is not medically stable to DC however the niece is wanting information on what the patient will do once DC home. HH was recommend by PT. The patient's niece was hoping she would get into a SNF. Patient's Insurance may be a barrier for [facility]. This CSW has informed the niece that applying for medicaid would be the next step. CSW advised niece that TOC will reach out Monday in regards to options for the patient at the time of DC. TOC will continue to follow.         Expected Discharge Plan and Services                                               Social Determinants of Health (SDOH) Interventions SDOH Screenings   Food Insecurity: No Food Insecurity (11/18/2022)  Housing: Low Risk  (11/18/2022)  Transportation Needs: No Transportation Needs (11/18/2022)  Utilities: Not At Risk (11/18/2022)  Tobacco Use: Medium Risk (11/18/2022)    Readmission Risk Interventions    11/19/2022   11:49 AM  Readmission Risk Prevention Plan  Transportation Screening Complete  PCP or Specialist Appt within 5-7 Days Complete  Home Care Screening Complete  Medication Review (RN CM) Complete

## 2022-11-20 NOTE — Progress Notes (Addendum)
Physical Therapy Treatment Patient Details Name: [name] MRN: [id number] DOB: 08-20-1945 Today's Date: 11/20/2022   History of Present Illness [name] is a 77 y.o. female admitted with acute on chronic renal failure, UTI, and dysphagia. PMH: anxiety, CKD stage III, CAD, esophageal dysmotility and stricture due to GERD    PT Comments  Pt is progressing with activity tolerance, she ambulated 225' with RW with intermittent min assist for balance. Ambulation distance limited by fatigue. Pt is at risk for falls and requires hands on assist to prevent falls 2* poor standing balance. If family is not able to provide 24* assistance and pt still requires hands on assistance at time of DC, pt need ST-SNF. Patient will benefit from continued inpatient follow up therapy, <3 hours/day.     If plan is discharge home, recommend the following: A little help with walking and/or transfers;A little help with bathing/dressing/bathroom;Assistance with cooking/housework;Help with stairs or ramp for entrance;Assist for transportation   Can travel by private vehicle        Equipment Recommendations  Rolling walker (2 wheels)    Recommendations for Other Services       Precautions / Restrictions Precautions Precautions: Fall Restrictions Weight Bearing Restrictions: No     Mobility  Bed Mobility               General bed mobility comments: up in recliner    Transfers Overall transfer level: Needs assistance Equipment used: Rolling walker (2 wheels) Transfers: Sit to/from Stand Sit to Stand: Min assist, Mod assist           General transfer comment: min A to power up then mod A to correct posterior lean initially in standing    Ambulation/Gait Ambulation/Gait assistance: Contact guard assist, Min assist Gait Distance (Feet): 225 Feet Assistive device: Rolling walker (2 wheels) Gait Pattern/deviations: Step-through pattern, Decreased stride length, Trunk flexed, Narrow  base of support Gait velocity: decreased     General Gait Details: step through gait pattern with narrow BOS, trunk forward flexed, min A-CGA for general unsteadiness and needing assist with minor LOB when turning   Stairs             Wheelchair Mobility     Tilt Bed    Modified Rankin (Stroke Patients Only)       Balance Overall balance assessment: Mild deficits observed, not formally tested, History of Falls                                          Cognition Arousal: Alert Behavior During Therapy: WFL for tasks assessed/performed, Flat affect Overall Cognitive Status: Within Functional Limits for tasks assessed                                 General Comments: pt pleasant, HOH        Exercises      General Comments        Pertinent Vitals/Pain Pain Assessment Pain Assessment: No/denies pain    Home Living                          Prior Function            PT Goals (current goals can now be found in the care plan section) Acute Rehab  PT Goals Patient Stated Goal: home with dog [name] PT Goal Formulation: With patient Time For Goal Achievement: 12/03/22 Potential to Achieve Goals: Good Progress towards PT goals: Progressing toward goals    Frequency    Min 1X/week      PT Plan      Co-evaluation              AM-PAC PT "6 Clicks" Mobility   Outcome Measure  Help needed turning from your back to your side while in a flat bed without using bedrails?: A Little Help needed moving from lying on your back to sitting on the side of a flat bed without using bedrails?: A Little Help needed moving to and from a bed to a chair (including a wheelchair)?: A Little Help needed standing up from a chair using your arms (e.g., wheelchair or bedside chair)?: A Little Help needed to walk in hospital room?: A Little Help needed climbing 3-5 steps with a railing? : A Lot 6 Click Score: 17    End of  Session Equipment Utilized During Treatment: Gait belt Activity Tolerance: Patient tolerated treatment well Patient left: in chair;with call bell/phone within reach;with family/visitor present Nurse Communication: Mobility status PT Visit Diagnosis: Unsteadiness on feet (R26.81);Muscle weakness (generalized) (M62.81)     Time: 1454-1510 PT Time Calculation (min) (ACUTE ONLY): 16 min  Charges:    $Gait Training: 8-22 mins PT General Charges $$ ACUTE PT VISIT: 1 Visit                     Tamala Ser PT 11/20/2022  Acute Rehabilitation Services  Office [phone]

## 2022-11-20 NOTE — Progress Notes (Signed)
PROGRESS NOTE    [name]  NWG:[id number] DOB: 1946/01/10 DOA: 11/18/2022 PCP: Merri Brunette, MD   Chief Complaint  Patient presents with   Weakness    Brief Narrative:  Patient 77 year old female history of anxiety, CKD stage III, CAD, esophageal dysmotility and stricture due to GERD presented to the ED with acute on chronic renal failure UTI and dysphagia.   Assessment & Plan:   Principal Problem:   UTI (urinary tract infection) Active Problems:   Convulsions/seizures (HCC)   Hypertension   Hyperlipidemia   Hypothyroidism   History of  seizures   Depression   GERD   Acute renal failure (HCC)   Hypokalemia   Dehydration   Hypomagnesemia  #1 UTI -Patient noted on urinalysis was cloudy, large hemoglobin, trace ketones, small leukocytes, nitrite negative, few bacteria, WBC >50. -Patient recently seen in the ED received a dose of IV Rocephin for presumed UTI, urine cultures were not obtained and patient was not sent out on further antibiotics at that time. -Urine cultures obtained with no growth to date however patient did receive IV antibiotics a few days ago when she presented to the ED. -Continue IV Rocephin, and treat empirically for 5 days.  2.  Acute kidney injury on CKD stage IIIa -Patient noted on admission with creatinine of 2.09 with baseline creatinine of approximately 1.2. -Likely secondary to prerenal azotemia in the setting of dehydration and poor oral intake, in the setting of ARB. -Patient placed on IV fluids with improvement with renal function creatinine down to 1.31.. -Continue to hold ARB. -Monitor urine output. -Continue IV fluids, supportive care.  3.  Dysphagia -Patient with history of esophageal dysmotility and esophageal stricture with last dilatation January 2021. -Patient noted to be followed by [facility], Dr. Marina Goodell as outpatient. -Admitting physician, Dr. Kirby Crigler discussed briefly with Dr. Marina Goodell who knows the patient well and  stated his symptoms were probably due to dysmotility and no inpatient GI consultation needed at this time. -Patient currently tolerating clear liquids.   -Esophagram done 11/19/2022 noted to be abnormal with mild esophageal dysmotility, smooth mild narrowing of the distal esophageal lumen preventing the passage of a 13 mm barium tablet, suspicious for mild recurrent stricture.  No mucosal irregularity identified.   -Case discussed with patient's primary gastroenterologist Dr. Marina Goodell who reviewed esophagram and felt patient's dysphagia was chronic and recommended outpatient follow-up for reevaluation and consideration of repeat endoscopy.   -Trial of dysphagia 2 diet and if patient tolerates will discharge with outpatient follow-up with GI.   4.  Hypokalemia/hypomagnesemia -Potassium at 3.1 this morning, magnesium at 2.2. -Patient noted to be on oral potassium and magnesium supplementation prior to admission. -Potassium packet 40 mEq. -Repeat labs in the AM.  5.  GERD -PPI.  6.  Lactic acidosis -Secondary to dehydration. -Improved with IV fluids.  7.  Hypertension -Resume home regimen Bystolic. -Continue to hold ARB due to AKI. -IV hydralazine as needed.  8.  Anxiety -Continue home regimen Cymbalta, Xanax.  9.  Seizure history -Continue Keppra.  10.  Hyperlipidemia -Continue statin.  11.  Hypothyroidism -Continue Synthroid.   DVT prophylaxis: Lovenox Code Status: DNR Family Communication: Updated patient.  No family at bedside. Disposition: TBD  Status is: Observation The patient remains OBS appropriate and will d/c before 2 midnights.   Consultants:  None  Procedures:  CT head 11/18/2022 Chest x-ray 11/18/2022 Esophagram 11/19/2022  Antimicrobials:  Anti-infectives (From admission, onward)    Start     Dose/Rate Route Frequency  Ordered Stop   11/19/22 1100  cefTRIAXone (ROCEPHIN) 1 g in sodium chloride 0.9 % 100 mL IVPB  Status:  Discontinued        1 g 200 mL/hr  over 30 Minutes Intravenous Every 24 hours 11/18/22 1333 11/19/22 0905   11/19/22 1100  cefTRIAXone (ROCEPHIN) 1 g in sodium chloride 0.9 % 100 mL IVPB        1 g 200 mL/hr over 30 Minutes Intravenous Every 24 hours 11/19/22 0905 11/23/22 1059   11/18/22 1115  cefTRIAXone (ROCEPHIN) 1 g in sodium chloride 0.9 % 100 mL IVPB        1 g 200 mL/hr over 30 Minutes Intravenous  Once 11/18/22 1114 11/18/22 1157         Subjective: Patient sitting up in bed.  States so far tolerating dysphagia 2 diet.  Denies any significant shortness of breath or chest pain.  Denies any abdominal pain.   Objective: Vitals:   11/19/22 0252 11/19/22 1439 11/19/22 1922 11/20/22 0609  BP: (!) 145/73 119/64 115/61 (!) 154/65  Pulse: (!) 58 (!) 55 (!) 53 (!) 56  Resp:   17   Temp: 97.6 F (36.4 C) 98.5 F (36.9 C) 98 F (36.7 C) 98 F (36.7 C)  TempSrc: Oral  Oral Oral  SpO2: 100% 98% 100% 95%  Weight:      Height:        Intake/Output Summary (Last 24 hours) at 11/20/2022 1316 Last data filed at 11/20/2022 0906 Gross per 24 hour  Intake 1440.92 ml  Output 500 ml  Net 940.92 ml   Filed Weights   11/18/22 0816 11/18/22 0831  Weight: 44.9 kg 44.9 kg    Examination:  General exam: NAD.  Dry mucous membranes.  Respiratory system: CTAB.  No wheezes, no crackles, no rhonchi.  Fair air movement.  Speaking in full sentences.  Cardiovascular system: Regular rate rhythm no murmurs rubs or gallops.  No JVD.  No pitting lower extremity edema.  Gastrointestinal system: Abdomen is nondistended, soft and nontender. No organomegaly or masses felt. Normal bowel sounds heard. Central nervous system: Alert and oriented. No focal neurological deficits. Extremities: Symmetric 5 x 5 power. Skin: No rashes, lesions or ulcers Psychiatry: Judgement and insight appear fair. Mood & affect appropriate.     Data Reviewed: I have personally reviewed following labs and imaging studies  CBC: Recent Labs  Lab  11/15/22 1106 11/18/22 0926 11/19/22 0555 11/20/22 0824  WBC 10.2 7.9 7.8 7.4  NEUTROABS  --  5.6  --   --   HGB 13.2 13.3 11.0* 13.4  HCT 41.6 40.8 35.0* 44.9  MCV 87.0 86.3 90.7 94.5  PLT 305 284 199 189    Basic Metabolic Panel: Recent Labs  Lab 11/15/22 1106 11/18/22 0926 11/19/22 0555 11/20/22 0824  NA 140 140 140 142  K 4.5 4.3 2.9* 3.1*  CL 106 103 108 111  CO2 21* 25 21* 20*  GLUCOSE 92 81 87 91  BUN 20 32* 23 13  CREATININE 1.74* 2.09* 1.49* 1.31*  CALCIUM 8.8* 9.0 7.4* 7.4*  MG  --   --  0.9* 2.2  PHOS  --   --   --  2.8    GFR: Estimated Creatinine Clearance: 24.9 mL/min (A) (by C-G formula based on SCr of 1.31 mg/dL (H)).  Liver Function Tests: Recent Labs  Lab 11/15/22 1106 11/18/22 0926 11/20/22 0824  AST 27 68*  --   ALT 14 28  --  ALKPHOS 67 63  --   BILITOT 0.9 0.7  --   PROT 6.7 7.7  --   ALBUMIN 3.7 4.5 3.2*    CBG: No results for input(s): "GLUCAP" in the last 168 hours.   Recent Results (from the past 240 hour(s))  Urine Culture     Status: None   Collection Time: 11/18/22  8:52 AM   Specimen: Urine, Clean Catch  Result Value Ref Range Status   Specimen Description   Final    URINE, CLEAN CATCH Performed at [facility], [address] [place], [place] [address]    Special Requests   Final    NONE Performed at [facility], [address], [place], [place] [address]    Culture   Final    NO GROWTH Performed at [facility], [address] [place], [place] [address]    Report Status 11/19/2022 FINAL  Final         Radiology Studies: DG ESOPHAGUS W SINGLE CM (SOL OR THIN BA)  Result Date: 11/19/2022 CLINICAL DATA:  77 year old inpatient with dysphagia. Bedside evaluation by speech pathology demonstrated esophageal dysphagia. History of remote esophageal dilatation (2007). EXAM: ESOPHOGRAM/BARIUM SWALLOW TECHNIQUE: Single contrast examination was performed using  thin  barium. FLUOROSCOPY: Radiation Exposure Index (as provided by the fluoroscopic device): 10.2 mGy Kerma COMPARISON:  Chest radiographs 11/18/2022.  Chest CT 02/19/2019. FINDINGS: Patient has limited mobility and limited ability to bear weight. A limited single contrast examination was performed in the semi erect, supine and right lateral decubitus positions. The patient swallowed the barium without difficulty. No laryngeal penetration or aspiration was observed during the examination. There is mild esophageal dysmotility with a mildly decreased primary stripping wave. There is smooth narrowing of the distal esophageal lumen without evidence of esophageal mass or significant hiatal hernia. This prevented the passage of a 13 mm barium tablet which was retained for 3-4 minutes despite multiple swallows of water and thin barium. Previous median sternotomy and prominent mitral annular calcifications are noted. IMPRESSION: 1. Limited single contrast examination due to patient mobility and limited ability to bear weight. 2. Mild esophageal dysmotility. 3. Smooth mild narrowing of the distal esophageal lumen preventing the passage of a 13 mm barium tablet, suspicious for a mild recurrent stricture. No mucosal irregularity identified. Electronically Signed   By: Carey Bullocks M.D.   On: 11/19/2022 12:29        Scheduled Meds:  ALPRAZolam  0.25 mg Oral BID   ARIPiprazole  2 mg Oral Daily   aspirin EC  81 mg Oral Daily   calcitRIOL  0.25 mcg Oral Daily   DULoxetine  30 mg Oral Daily   enoxaparin (LOVENOX) injection  30 mg Subcutaneous Q24H   levothyroxine  88 mcg Oral Q0600   pantoprazole (PROTONIX) IV  40 mg Intravenous Q12H   potassium chloride  20 mEq Oral BID   rosuvastatin  40 mg Oral Daily   Continuous Infusions:  cefTRIAXone (ROCEPHIN)  IV 1 g (11/20/22 1116)   levETIRAcetam 500 mg (11/20/22 0907)     LOS: 1 day    Time spent: 40 minutes    Ramiro Harvest, MD [facility]   To  contact the attending provider between 7A-7P or the covering provider during after hours 7P-7A, please log into the web site [email or web address] and access using universal [place] password for that web site. If you do not have the password, please call the hospital operator.  11/20/2022, 1:16 PM

## 2022-11-20 NOTE — Evaluation (Signed)
Occupational Therapy Evaluation Patient Details Name: [name] MRN: [id number] DOB: 1945-09-11 Today's Date: 11/20/2022   History of Present Illness [name] is a 77 y.o. female admitted with acute on chronic renal failure, UTI, and dysphagia. PMH: anxiety, CKD stage III, CAD, esophageal dysmotility and stricture due to GERD   Clinical Impression   Patient is a 77 year old female who was admitted for above. Patient was living at home alone with independence in ADLs prior level. Currently, patient is min A for ADLs with poor balance noted without UE support. Patient does not have 24/7 caregiver support available in next level of care. Patient will benefit from continued inpatient follow up therapy, <3 hours/day        If plan is discharge home, recommend the following: A lot of help with bathing/dressing/bathroom;Assistance with cooking/housework;Direct supervision/assist for medications management;Assist for transportation;Help with stairs or ramp for entrance;Direct supervision/assist for financial management;A lot of help with walking and/or transfers    Functional Status Assessment  Patient has had a recent decline in their functional status and demonstrates the ability to make significant improvements in function in a reasonable and predictable amount of time.  Equipment Recommendations  None recommended by OT       Precautions / Restrictions Precautions Precautions: Fall Precaution Comments: HOH Restrictions Weight Bearing Restrictions: No      Mobility Bed Mobility               General bed mobility comments: up in recliner and returned to the same          Balance Overall balance assessment: Mild deficits observed, not formally tested, History of Falls         ADL either performed or assessed with clinical judgement   ADL Overall ADL's : Needs assistance/impaired Eating/Feeding: Set up;Sitting   Grooming: Minimal assistance;Sitting   Upper  Body Bathing: Sitting;Minimal assistance   Lower Body Bathing: Sitting/lateral leans;Minimal assistance;Contact guard assist;Sit to/from stand Lower Body Bathing Details (indicate cue type and reason): unsteady with no UE support in standing.simulated tasks Upper Body Dressing : Sitting;Minimal assistance   Lower Body Dressing: Sitting/lateral leans;Minimal assistance;Contact guard assist Lower Body Dressing Details (indicate cue type and reason): unsteady in standing. simulated Toilet Transfer: Minimal assistance;Ambulation;Rolling walker (2 wheels) Toilet Transfer Details (indicate cue type and reason): with scissoring of legs with movement. slower movements. Toileting- Clothing Manipulation and Hygiene: Minimal assistance;Sit to/from stand Toileting - Clothing Manipulation Details (indicate cue type and reason): unsteady             Vision   Vision Assessment?: No apparent visual deficits            Pertinent Vitals/Pain Pain Assessment Pain Assessment: No/denies pain     Extremity/Trunk Assessment Upper Extremity Assessment Upper Extremity Assessment: RUE deficits/detail RUE Deficits / Details: audible noise with movement of hand wrist elbow and shoulder. patient able to range to about 90 degrees FF   Lower Extremity Assessment Lower Extremity Assessment: RLE deficits/detail   Cervical / Trunk Assessment Cervical / Trunk Assessment: Kyphotic   Communication Communication Communication: Hearing impairment   Cognition Arousal: Alert Behavior During Therapy: WFL for tasks assessed/performed, Flat affect Overall Cognitive Status: Within Functional Limits for tasks assessed       General Comments: HOH,                Home Living Family/patient expects to be discharged to:: Private residence Living Arrangements: Alone Available Help at Discharge: Family;Neighbor;Available PRN/intermittently Type of  Home: House Home Access: Stairs to enter [employer] of Steps: 5 back door with R handrail, 2 front foor with bil handrails   Home Layout: One level     Bathroom Shower/Tub: Tub/shower unit         Home Equipment: Cane - single point          Prior Functioning/Environment Prior Level of Function : Independent/Modified Independent;Driving             Mobility Comments: pt reports ind without AD, reports tried [facility] but feels unsteady, reports "not many" falls this year ADLs Comments: pt reports ind with ALDs/IADLs        OT Problem List: Decreased activity tolerance;Impaired balance (sitting and/or standing);Decreased coordination;Decreased safety awareness;Decreased knowledge of precautions;Decreased knowledge of use of DME or AE;Impaired UE functional use      OT Treatment/Interventions: Self-care/ADL training;Therapeutic exercise;DME and/or AE instruction;Therapeutic activities;Patient/family education;Balance training    OT Goals(Current goals can be found in the care plan section) Acute Rehab OT Goals Patient Stated Goal: to go home OT Goal Formulation: With patient Time For Goal Achievement: 12/04/22 Potential to Achieve Goals: Fair  OT Frequency: Min 1X/week       AM-PAC OT "6 Clicks" Daily Activity     Outcome Measure Help from another person eating meals?: A Little Help from another person taking care of personal grooming?: A Little Help from another person toileting, which includes using toliet, bedpan, or urinal?: A Lot Help from another person bathing (including washing, rinsing, drying)?: A Lot Help from another person to put on and taking off regular upper body clothing?: A Little Help from another person to put on and taking off regular lower body clothing?: A Lot 6 Click Score: 15   End of Session Equipment Utilized During Treatment: Rolling walker (2 wheels) Nurse Communication: Mobility status  Activity Tolerance: Patient tolerated treatment well Patient left: in chair;with call  bell/phone within reach;with family/visitor present  OT Visit Diagnosis: Unsteadiness on feet (R26.81);Other abnormalities of gait and mobility (R26.89);Muscle weakness (generalized) (M62.81)                Time: 5621-3086 OT Time Calculation (min): 13 min Charges:  OT General Charges $OT Visit: 1 Visit OT Evaluation $OT Eval Moderate Complexity: 1 Mod  Hazell Siwik OTR/L, MS Acute Rehabilitation Department Office# [phone]   Selinda Flavin 11/20/2022, 5:08 PM

## 2022-11-20 NOTE — Progress Notes (Signed)
GI ATTENDING  Contacted regarding dysphagia (admitted for other reasons).  Esophagram reviewed.  Mild stricturing dysmotility as previous.  Multiple prior endoscopies.  Most recent 2021. I know this very pleasant patient quite well.  Her dysphagia is chronic. I will get her back into my office soon for reevaluation and consideration of repeat endoscopy.  Thanks.  Wilhemina Bonito. Eda Keys., M.D. [facility] Division of Gastroenterology

## 2022-11-21 DIAGNOSIS — N3000 Acute cystitis without hematuria: Secondary | ICD-10-CM | POA: Diagnosis not present

## 2022-11-21 DIAGNOSIS — N179 Acute kidney failure, unspecified: Secondary | ICD-10-CM | POA: Diagnosis not present

## 2022-11-21 DIAGNOSIS — F32A Depression, unspecified: Secondary | ICD-10-CM | POA: Diagnosis not present

## 2022-11-21 DIAGNOSIS — I10 Essential (primary) hypertension: Secondary | ICD-10-CM | POA: Diagnosis not present

## 2022-11-21 LAB — BASIC METABOLIC PANEL
Anion gap: 11 (ref 5–15)
Anion gap: 9 (ref 5–15)
BUN: 12 mg/dL (ref 8–23)
BUN: 14 mg/dL (ref 8–23)
CO2: 22 mmol/L (ref 22–32)
CO2: 21 mmol/L — ABNORMAL LOW (ref 22–32)
Calcium: 7.3 mg/dL — ABNORMAL LOW (ref 8.9–10.3)
Calcium: 7.2 mg/dL — ABNORMAL LOW (ref 8.9–10.3)
Chloride: 106 mmol/L (ref 98–111)
Chloride: 108 mmol/L (ref 98–111)
Creatinine, Ser: 1.28 mg/dL — ABNORMAL HIGH (ref 0.44–1.00)
Creatinine, Ser: 1.44 mg/dL — ABNORMAL HIGH (ref 0.44–1.00)
GFR, Estimated: 43 mL/min — ABNORMAL LOW (ref >60)
GFR, Estimated: 38 mL/min — ABNORMAL LOW (ref >60)
Glucose, Bld: 116 mg/dL — ABNORMAL HIGH (ref 70–99)
Glucose, Bld: 111 mg/dL — ABNORMAL HIGH (ref 70–99)
Potassium: 2.3 mmol/L — CL (ref 3.5–5.1)
Potassium: 3.9 mmol/L (ref 3.5–5.1)
Sodium: 139 mmol/L (ref 135–145)
Sodium: 138 mmol/L (ref 135–145)

## 2022-11-21 LAB — MAGNESIUM: Magnesium: 1.2 mg/dL — ABNORMAL LOW (ref 1.7–2.4)

## 2022-11-21 LAB — CBC
HCT: 40.1 % (ref 36.0–46.0)
Hemoglobin: 12.8 g/dL (ref 12.0–15.0)
MCH: 28.1 pg (ref 26.0–34.0)
MCHC: 31.9 g/dL (ref 30.0–36.0)
MCV: 87.9 fL (ref 80.0–100.0)
Platelets: 207 10*3/uL (ref 150–400)
RBC: 4.56 MIL/uL (ref 3.87–5.11)
RDW: 13.8 % (ref 11.5–15.5)
WBC: 9.9 10*3/uL (ref 4.0–10.5)
nRBC: 0 % (ref 0.0–0.2)

## 2022-11-21 MED ORDER — DILTIAZEM HCL ER COATED BEADS 180 MG PO CP24
180.0000 mg | ORAL_CAPSULE | Freq: Every day | ORAL | Status: DC
  Administered 2022-11-21 – 2022-11-27 (×7): 180 mg via ORAL
  Filled 2022-11-21 (×7): qty 1

## 2022-11-21 MED ORDER — MAGNESIUM SULFATE 4 GM/100ML IV SOLN
4.0000 g | Freq: Once | INTRAVENOUS | Status: AC
  Administered 2022-11-21: 4 g via INTRAVENOUS
  Filled 2022-11-21: qty 100

## 2022-11-21 MED ORDER — NEBIVOLOL HCL 2.5 MG PO TABS
2.5000 mg | ORAL_TABLET | Freq: Two times a day (BID) | ORAL | Status: DC
  Administered 2022-11-21 – 2022-11-27 (×13): 2.5 mg via ORAL
  Filled 2022-11-21 (×13): qty 1

## 2022-11-21 MED ORDER — POTASSIUM CHLORIDE 20 MEQ PO PACK
40.0000 meq | PACK | ORAL | Status: AC
  Administered 2022-11-21 (×2): 40 meq via ORAL
  Filled 2022-11-21 (×2): qty 2

## 2022-11-21 MED ORDER — PANTOPRAZOLE SODIUM 40 MG PO TBEC
40.0000 mg | DELAYED_RELEASE_TABLET | Freq: Two times a day (BID) | ORAL | Status: DC
  Administered 2022-11-21 – 2022-11-27 (×11): 40 mg via ORAL
  Filled 2022-11-21 (×13): qty 1

## 2022-11-21 NOTE — Progress Notes (Signed)
Lab called for critical value for potassium 2.3.Notified to the MD

## 2022-11-21 NOTE — Plan of Care (Signed)

## 2022-11-21 NOTE — Progress Notes (Signed)
Mobility Specialist - Progress Note   11/21/22 1359  Mobility  Activity Stood at bedside  Level of Assistance Contact guard assist, steadying assist  Assistive Device Front wheel walker  Range of Motion/Exercises Active  Activity Response Tolerated fair  Mobility Referral Yes  $Mobility charge 1 Mobility  Mobility Specialist Start Time (ACUTE ONLY) 1345  Mobility Specialist Stop Time (ACUTE ONLY) 1354  Mobility Specialist Time Calculation (min) (ACUTE ONLY) 9 min   Received in bed and agreed to mobility after some convincing. Min A for bed mobility with c/o dizziness, when standing dizziness continued and pt began to refuse the rest of session. Returned to bed with all needs met.  Marilynne Halsted Mobility Specialist

## 2022-11-21 NOTE — Progress Notes (Signed)
PROGRESS NOTE    [name]  QMV:[id number] DOB: 1945-04-21 DOA: 11/18/2022 PCP: Merri Brunette, MD   Chief Complaint  Patient presents with   Weakness    Brief Narrative:  Patient 77 year old female history of anxiety, CKD stage III, CAD, esophageal dysmotility and stricture due to GERD presented to the ED with acute on chronic renal failure UTI and dysphagia.   Assessment & Plan:   Principal Problem:   UTI (urinary tract infection) Active Problems:   Convulsions/seizures (HCC)   Hypertension   Hyperlipidemia   Hypothyroidism   History of  seizures   Depression   GERD   Acute renal failure (HCC)   Hypokalemia   Dehydration   Hypomagnesemia  #1 UTI -Patient noted on urinalysis was cloudy, large hemoglobin, trace ketones, small leukocytes, nitrite negative, few bacteria, WBC >50. -Patient recently seen in the ED received a dose of IV Rocephin for presumed UTI, urine cultures were not obtained and patient was not sent out on further antibiotics at that time. -Urine cultures obtained with no growth to date however patient did receive IV antibiotics a few days ago when she presented to the ED. -Continue IV Rocephin, and treat empirically for 5 days.  2.  Acute kidney injury on CKD stage IIIa -Patient noted on admission with creatinine of 2.09 with baseline creatinine of approximately 1.2. -Likely secondary to prerenal azotemia in the setting of dehydration and poor oral intake, in the setting of ARB. -Patient placed on IV fluids with improvement with renal function creatinine down to 1.44.. -Continue to hold ARB. -Monitor urine output. -Continue IV fluids, supportive care.  3.  Dysphagia -Patient with history of esophageal dysmotility and esophageal stricture with last dilatation January 2021. -Patient noted to be followed by  GI, Dr. Marina Goodell as outpatient. -Admitting physician, Dr. Kirby Crigler discussed briefly with Dr. Marina Goodell who knows the patient well and  stated his symptoms were probably due to dysmotility and no inpatient GI consultation needed at this time. -Patient currently tolerating clear liquids.   -Esophagram done 11/19/2022 noted to be abnormal with mild esophageal dysmotility, smooth mild narrowing of the distal esophageal lumen preventing the passage of a 13 mm barium tablet, suspicious for mild recurrent stricture.  No mucosal irregularity identified.   -Case discussed with patient's primary gastroenterologist Dr. Marina Goodell who reviewed esophagram and felt patient's dysphagia was chronic and recommended outpatient follow-up for reevaluation and consideration of repeat endoscopy.   -Patient started on a dysphagia 2 diet which she seems to be tolerating.   -Follow.  4.  Hypokalemia/hypomagnesemia -Potassium at 2.3 this morning, magnesium at 1.2.. -Patient noted to be on oral potassium and magnesium supplementation prior to admission. -Potassium packet 40 mEq every 4 hours x 2 doses.  Continue twice daily potassium. -Magnesium sulfate 4 g IV x 1. -Repeat labs in the AM.  5.  GERD -Change IV PPI to oral PPI.   6.  Lactic acidosis -Secondary to dehydration. -Improved with IV fluids.  7.  Hypertension -Resume home regimen Bystolic and Cardizem.. -Continue to hold ARB due to AKI. -IV hydralazine as needed.  8.  Anxiety -Continue home regimen Cymbalta, Xanax.  9.  Seizure history -Continue Keppra.  10.  Hyperlipidemia -Statin.    11.  Hypothyroidism -Synthroid.   DVT prophylaxis: Lovenox Code Status: DNR Family Communication: Updated patient.  No family at bedside. Disposition: TBD  Status is: Observation The patient remains OBS appropriate and will d/c before 2 midnights.   Consultants:  None  Procedures:  CT  head 11/18/2022 Chest x-ray 11/18/2022 Esophagram 11/19/2022  Antimicrobials:  Anti-infectives (From admission, onward)    Start     Dose/Rate Route Frequency Ordered Stop   11/19/22 1100  cefTRIAXone  (ROCEPHIN) 1 g in sodium chloride 0.9 % 100 mL IVPB  Status:  Discontinued        1 g 200 mL/hr over 30 Minutes Intravenous Every 24 hours 11/18/22 1333 11/19/22 0905   11/19/22 1100  cefTRIAXone (ROCEPHIN) 1 g in sodium chloride 0.9 % 100 mL IVPB        1 g 200 mL/hr over 30 Minutes Intravenous Every 24 hours 11/19/22 0905 11/23/22 1059   11/18/22 1115  cefTRIAXone (ROCEPHIN) 1 g in sodium chloride 0.9 % 100 mL IVPB        1 g 200 mL/hr over 30 Minutes Intravenous  Once 11/18/22 1114 11/18/22 1157         Subjective: Patient sitting up eating lunch.  States she is tolerating current diet but per nurse eating little.  Denies any chest pain or shortness of breath.   Objective: Vitals:   11/21/22 1211 11/21/22 1219 11/21/22 1302 11/21/22 1306  BP: (!) 176/78 (!) 176/78 121/68   Pulse: 74  91   Resp: 18  18   Temp: 97.7 F (36.5 C)  97.9 F (36.6 C)   TempSrc:      SpO2: 100%  (!) 58% 98%  Weight:      Height:        Intake/Output Summary (Last 24 hours) at 11/21/2022 1320 Last data filed at 11/21/2022 1300 Gross per 24 hour  Intake 860 ml  Output 1950 ml  Net -1090 ml   Filed Weights   11/18/22 0816 11/18/22 0831  Weight: 44.9 kg 44.9 kg    Examination:  General exam: NAD.   Respiratory system: Lungs clear to auscultation bilaterally.  No wheezes, no crackles, no rhonchi.  Fair air movement.  Speaking in full sentences.   Cardiovascular system: RRR no murmurs rubs or gallops.  No JVD.  No lower extremity edema. Gastrointestinal system: Abdomen is soft, nontender, nondistended, positive bowel sounds.  No rebound.  No guarding.  Central nervous system: Alert and oriented. No focal neurological deficits. Extremities: Symmetric 5 x 5 power. Skin: No rashes, lesions or ulcers Psychiatry: Judgement and insight appear fair. Mood & affect appropriate.     Data Reviewed: I have personally reviewed following labs and imaging studies  CBC: Recent Labs  Lab  11/15/22 1106 11/18/22 0926 11/19/22 0555 11/20/22 0824 11/21/22 0744  WBC 10.2 7.9 7.8 7.4 9.9  NEUTROABS  --  5.6  --   --   --   HGB 13.2 13.3 11.0* 13.4 12.8  HCT 41.6 40.8 35.0* 44.9 40.1  MCV 87.0 86.3 90.7 94.5 87.9  PLT 305 284 199 189 207    Basic Metabolic Panel: Recent Labs  Lab 11/15/22 1106 11/18/22 0926 11/19/22 0555 11/20/22 0824 11/21/22 0744  NA 140 140 140 142 139  K 4.5 4.3 2.9* 3.1* 2.3*  CL 106 103 108 111 106  CO2 21* 25 21* 20* 22  GLUCOSE 92 81 87 91 116*  BUN 20 32* 23 13 12   CREATININE 1.74* 2.09* 1.49* 1.31* 1.28*  CALCIUM 8.8* 9.0 7.4* 7.4* 7.3*  MG  --   --  0.9* 2.2 1.2*  PHOS  --   --   --  2.8  --     GFR: Estimated Creatinine Clearance: 25.5 mL/min (A) (by C-G formula  based on SCr of 1.28 mg/dL (H)).  Liver Function Tests: Recent Labs  Lab 11/15/22 1106 11/18/22 0926 11/20/22 0824  AST 27 68*  --   ALT 14 28  --   ALKPHOS 67 63  --   BILITOT 0.9 0.7  --   PROT 6.7 7.7  --   ALBUMIN 3.7 4.5 3.2*    CBG: No results for input(s): "GLUCAP" in the last 168 hours.   Recent Results (from the past 240 hour(s))  Urine Culture     Status: None   Collection Time: 11/18/22  8:52 AM   Specimen: Urine, Clean Catch  Result Value Ref Range Status   Specimen Description   Final    URINE, CLEAN CATCH Performed at [facility], [address], [place], [place] [address]    Special Requests   Final    NONE Performed at [facility], [address], [place], [place] [address]    Culture   Final    NO GROWTH Performed at [facility], [address] [place], [place] [address]    Report Status 11/19/2022 FINAL  Final         Radiology Studies: No results found.      Scheduled Meds:  ALPRAZolam  0.25 mg Oral BID   ARIPiprazole  2 mg Oral Daily   aspirin EC  81 mg Oral Daily   calcitRIOL  0.25 mcg Oral Daily   diltiazem  180 mg Oral Daily   DULoxetine  30 mg Oral Daily    enoxaparin (LOVENOX) injection  30 mg Subcutaneous Q24H   levothyroxine  88 mcg Oral Q0600   nebivolol  2.5 mg Oral BID   mouth rinse  15 mL Mouth Rinse 4 times per day   pantoprazole  40 mg Oral BID   potassium chloride  20 mEq Oral BID   potassium chloride  40 mEq Oral Q4H   rosuvastatin  40 mg Oral Daily   Continuous Infusions:  cefTRIAXone (ROCEPHIN)  IV 1 g (11/21/22 1027)   levETIRAcetam 500 mg (11/21/22 0856)     LOS: 1 day    Time spent: 40 minutes    Ramiro Harvest, MD [facility]   To contact the attending provider between 7A-7P or the covering provider during after hours 7P-7A, please log into the web site [email or web address] and access using universal [place] password for that web site. If you do not have the password, please call the hospital operator.  11/21/2022, 1:20 PM

## 2022-11-22 ENCOUNTER — Telehealth: Payer: Self-pay

## 2022-11-22 DIAGNOSIS — R569 Unspecified convulsions: Secondary | ICD-10-CM

## 2022-11-22 DIAGNOSIS — E860 Dehydration: Secondary | ICD-10-CM

## 2022-11-22 DIAGNOSIS — E038 Other specified hypothyroidism: Secondary | ICD-10-CM | POA: Diagnosis not present

## 2022-11-22 DIAGNOSIS — N3000 Acute cystitis without hematuria: Secondary | ICD-10-CM | POA: Diagnosis not present

## 2022-11-22 LAB — CBC
HCT: 38 % (ref 36.0–46.0)
Hemoglobin: 12.2 g/dL (ref 12.0–15.0)
MCH: 28.2 pg (ref 26.0–34.0)
MCHC: 32.1 g/dL (ref 30.0–36.0)
MCV: 88 fL (ref 80.0–100.0)
Platelets: 253 10*3/uL (ref 150–400)
RBC: 4.32 MIL/uL (ref 3.87–5.11)
RDW: 14 % (ref 11.5–15.5)
WBC: 13.4 10*3/uL — ABNORMAL HIGH (ref 4.0–10.5)
nRBC: 0 % (ref 0.0–0.2)

## 2022-11-22 LAB — BASIC METABOLIC PANEL
Anion gap: 8 (ref 5–15)
BUN: 16 mg/dL (ref 8–23)
CO2: 22 mmol/L (ref 22–32)
Calcium: 7.2 mg/dL — ABNORMAL LOW (ref 8.9–10.3)
Chloride: 102 mmol/L (ref 98–111)
Creatinine, Ser: 1.32 mg/dL — ABNORMAL HIGH (ref 0.44–1.00)
GFR, Estimated: 42 mL/min — ABNORMAL LOW (ref >60)
Glucose, Bld: 107 mg/dL — ABNORMAL HIGH (ref 70–99)
Potassium: 3.6 mmol/L (ref 3.5–5.1)
Sodium: 132 mmol/L — ABNORMAL LOW (ref 135–145)

## 2022-11-22 LAB — MAGNESIUM: Magnesium: 2.4 mg/dL (ref 1.7–2.4)

## 2022-11-22 MED ORDER — LEVETIRACETAM 500 MG PO TABS
500.0000 mg | ORAL_TABLET | Freq: Two times a day (BID) | ORAL | Status: DC
  Administered 2022-11-22 – 2022-11-27 (×11): 500 mg via ORAL
  Filled 2022-11-22 (×11): qty 1

## 2022-11-22 NOTE — Telephone Encounter (Signed)
Pt scheduled to see Hyacinth Meeker PA 11/29/22 at 1:30pm. Left message for pt regarding appt date and time.

## 2022-11-22 NOTE — Progress Notes (Addendum)
PROGRESS NOTE    [name]  UEA:[id number] DOB: 10/19/1945 DOA: 11/18/2022 PCP: Merri Brunette, MD   Chief Complaint  Patient presents with   Weakness    Brief Narrative:  Patient 77 year old female history of anxiety, CKD stage III, CAD, esophageal dysmotility and stricture due to GERD presented to the ED with acute on chronic renal failure UTI and dysphagia.   Assessment & Plan:   Principal Problem:   UTI (urinary tract infection) Active Problems:   Convulsions/seizures (HCC)   Hypertension   Hyperlipidemia   Hypothyroidism   History of  seizures   Depression   GERD   Acute renal failure (HCC)   Hypokalemia   Dehydration   Hypomagnesemia  #1 UTI -Patient noted on urinalysis was cloudy, large hemoglobin, trace ketones, small leukocytes, nitrite negative, few bacteria, WBC >50. -Patient recently seen in the ED received a dose of IV Rocephin for presumed UTI, urine cultures were not obtained and patient was not sent out on further antibiotics at that time. -Urine cultures obtained with no growth to date however patient did receive IV antibiotics a few days ago when she presented to the ED. -Continue IV Rocephin, and treat empirically for 5 days.  2.  Acute kidney injury on CKD stage IIIa -Patient noted on admission with creatinine of 2.09 with baseline creatinine of approximately 1.2. -Likely secondary to prerenal azotemia in the setting of dehydration and poor oral intake, in the setting of ARB. -Patient placed on IV fluids with improvement with renal function creatinine down to 1.32.. -Continue to hold ARB. -Monitor urine output. -Saline lock IV fluids.  Supportive care.    3.  Dysphagia -Patient with history of esophageal dysmotility and esophageal stricture with last dilatation January 2021. -Patient noted to be followed by [facility], Dr. Marina Goodell as outpatient. -Admitting physician, Dr. Kirby Crigler discussed briefly with Dr. Marina Goodell who knows the patient well and  stated his symptoms were probably due to dysmotility and no inpatient GI consultation needed at this time. -Patient currently tolerating clear liquids.   -Esophagram done 11/19/2022 noted to be abnormal with mild esophageal dysmotility, smooth mild narrowing of the distal esophageal lumen preventing the passage of a 13 mm barium tablet, suspicious for mild recurrent stricture.  No mucosal irregularity identified.   -Case discussed with patient's primary gastroenterologist Dr. Marina Goodell who reviewed esophagram and felt patient's dysphagia was chronic and recommended outpatient follow-up for reevaluation and consideration of repeat endoscopy.   -Patient started on a dysphagia 2 diet which she seems to be tolerating.   -Follow.  4.  Hypokalemia/hypomagnesemia -Potassium and magnesium repleted.   -Potassium at 3.6.  Magnesium at 2.4.  -Repeat labs in the AM.  5.  GERD -PPI.    6.  Lactic acidosis -Secondary to dehydration. -Improved with IV fluids.  7.  Hypertension -BP better controlled with resumption of home regimen of Bystolic and Cardizem.   -Continue to hold ARB due to AKI.   -IV hydralazine as needed.   8.  Anxiety -Continue home regimen Cymbalta, Xanax.  9.  Seizure history -Continue Keppra.  10.  Hyperlipidemia -Continue statin.    11.  Hypothyroidism -Continue Synthroid.   DVT prophylaxis: Lovenox Code Status: DNR Family Communication: Updated patient. Updated niece on telephone.   Disposition: SNF per PT recommendations  Status is: Observation The patient remains OBS appropriate and will d/c before 2 midnights.   Consultants:  None  Procedures:  CT head 11/18/2022 Chest x-ray 11/18/2022 Esophagram 11/19/2022  Antimicrobials:  Anti-infectives (From  admission, onward)    Start     Dose/Rate Route Frequency Ordered Stop   11/19/22 1100  cefTRIAXone (ROCEPHIN) 1 g in sodium chloride 0.9 % 100 mL IVPB  Status:  Discontinued        1 g 200 mL/hr over 30 Minutes  Intravenous Every 24 hours 11/18/22 1333 11/19/22 0905   11/19/22 1100  cefTRIAXone (ROCEPHIN) 1 g in sodium chloride 0.9 % 100 mL IVPB        1 g 200 mL/hr over 30 Minutes Intravenous Every 24 hours 11/19/22 0905 11/22/22 1217   11/18/22 1115  cefTRIAXone (ROCEPHIN) 1 g in sodium chloride 0.9 % 100 mL IVPB        1 g 200 mL/hr over 30 Minutes Intravenous  Once 11/18/22 1114 11/18/22 1157         Subjective: Patient laying in bed.  States she is tolerating current diet.  Denies any chest pain or shortness of breath.  No abdominal pain.  Overall feeling well.    Objective: Vitals:   11/21/22 1302 11/21/22 1306 11/21/22 1907 11/22/22 0443  BP: 121/68  (!) 157/85 (!) 149/76  Pulse: 91  88 73  Resp: 18  16 16   Temp: 97.9 F (36.6 C)  99.4 F (37.4 C) 98.5 F (36.9 C)  TempSrc:    Oral  SpO2: (!) 58% 98% 98% 98%  Weight:      Height:        Intake/Output Summary (Last 24 hours) at 11/22/2022 1219 Last data filed at 11/22/2022 0525 Gross per 24 hour  Intake 680 ml  Output 1100 ml  Net -420 ml   Filed Weights   11/18/22 0816 11/18/22 0831  Weight: 44.9 kg 44.9 kg    Examination:  General exam: NAD.   Respiratory system: CTAB.  No wheezes, no crackles, no rhonchi.  Fair air movement.  Speaking in full sentences.  Cardiovascular system: Regular rate rhythm no murmurs rubs or gallops.  No JVD.  No lower extremity edema.   Gastrointestinal system: Abdomen is soft, nontender, nondistended, positive bowel sounds.  No rebound.  No guarding.  Central nervous system: Alert and oriented. No focal neurological deficits. Extremities: Symmetric 5 x 5 power. Skin: No rashes, lesions or ulcers Psychiatry: Judgement and insight appear fair. Mood & affect appropriate.     Data Reviewed: I have personally reviewed following labs and imaging studies  CBC: Recent Labs  Lab 11/18/22 0926 11/19/22 0555 11/20/22 0824 11/21/22 0744 11/22/22 0516  WBC 7.9 7.8 7.4 9.9 13.4*   NEUTROABS 5.6  --   --   --   --   HGB 13.3 11.0* 13.4 12.8 12.2  HCT 40.8 35.0* 44.9 40.1 38.0  MCV 86.3 90.7 94.5 87.9 88.0  PLT 284 199 189 207 253    Basic Metabolic Panel: Recent Labs  Lab 11/19/22 0555 11/20/22 0824 11/21/22 0744 11/21/22 1702 11/22/22 0516  NA 140 142 139 138 132*  K 2.9* 3.1* 2.3* 3.9 3.6  CL 108 111 106 108 102  CO2 21* 20* 22 21* 22  GLUCOSE 87 91 116* 111* 107*  BUN 23 13 12 14 16   CREATININE 1.49* 1.31* 1.28* 1.44* 1.32*  CALCIUM 7.4* 7.4* 7.3* 7.2* 7.2*  MG 0.9* 2.2 1.2*  --  2.4  PHOS  --  2.8  --   --   --     GFR: Estimated Creatinine Clearance: 24.7 mL/min (A) (by C-G formula based on SCr of 1.32 mg/dL (H)).  Liver  Function Tests: Recent Labs  Lab 11/18/22 0926 11/20/22 0824  AST 68*  --   ALT 28  --   ALKPHOS 63  --   BILITOT 0.7  --   PROT 7.7  --   ALBUMIN 4.5 3.2*    CBG: No results for input(s): "GLUCAP" in the last 168 hours.   Recent Results (from the past 240 hour(s))  Urine Culture     Status: None   Collection Time: 11/18/22  8:52 AM   Specimen: Urine, Clean Catch  Result Value Ref Range Status   Specimen Description   Final    URINE, CLEAN CATCH Performed at [facility], [address] [place], [place] [address]    Special Requests   Final    NONE Performed at [facility], [address] [place], [place] [address]    Culture   Final    NO GROWTH Performed at [facility], [address], [place], [place] [address]    Report Status 11/19/2022 FINAL  Final         Radiology Studies: No results found.      Scheduled Meds:  ALPRAZolam  0.25 mg Oral BID   ARIPiprazole  2 mg Oral Daily   aspirin EC  81 mg Oral Daily   calcitRIOL  0.25 mcg Oral Daily   diltiazem  180 mg Oral Daily   DULoxetine  30 mg Oral Daily   enoxaparin (LOVENOX) injection  30 mg Subcutaneous Q24H   levETIRAcetam  500 mg Oral BID   levothyroxine  88 mcg Oral Q0600    nebivolol  2.5 mg Oral BID   mouth rinse  15 mL Mouth Rinse 4 times per day   pantoprazole  40 mg Oral BID   potassium chloride  20 mEq Oral BID   rosuvastatin  40 mg Oral Daily   Continuous Infusions:     LOS: 0 days    Time spent: 40 minutes    Ramiro Harvest, MD [facility]   To contact the attending provider between 7A-7P or the covering provider during after hours 7P-7A, please log into the web site [email or web address] and access using universal [place] password for that web site. If you do not have the password, please call the hospital operator.  11/22/2022, 12:19 PM

## 2022-11-22 NOTE — Telephone Encounter (Signed)
-----   Message from Yancey Flemings sent at 11/20/2022  1:16 PM EST ----- Regarding: Office follow-up [name], This patient of mine is in the hospital for other reasons. They contacted me regarding dysphagia.  I did not see her, but offered outpatient follow-up. Please set her up to see me or one of the advanced practitioners as an outpatient, ASAP, in the office for evaluation.  She may need repeat upper endoscopy. Thanks, Dr. Marina Goodell

## 2022-11-22 NOTE — Plan of Care (Signed)

## 2022-11-22 NOTE — Progress Notes (Signed)
Physical Therapy Treatment Patient Details Name: [name] MRN: [id number] DOB: 05-May-1945 Today's Date: 11/22/2022   History of Present Illness Bellamy TERIAH SHARIFI is a 77 y.o. female admitted with acute on chronic renal failure, UTI, and dysphagia. PMH: anxiety, CKD stage III, CAD, esophageal dysmotility and stricture due to GERD    PT Comments  Pt with gradual progress, but does fatigue easily and needs frequent min A for balance when walking.  Pt remains high fall risk and lives alone.  She is below her baseline.  Continue to recommend Patient will benefit from continued inpatient follow up therapy, <3 hours/day at d/c.     If plan is discharge home, recommend the following: A little help with walking and/or transfers;A little help with bathing/dressing/bathroom;Assistance with cooking/housework;Help with stairs or ramp for entrance;Assist for transportation   Can travel by private vehicle     No  Equipment Recommendations  Rolling walker (2 wheels)    Recommendations for Other Services       Precautions / Restrictions Precautions Precautions: Fall Restrictions Weight Bearing Restrictions: No     Mobility  Bed Mobility Overal bed mobility: Needs Assistance Bed Mobility: Supine to Sit     Supine to sit: Min assist     General bed mobility comments: Min A to move to EOB with increased time to initiate and slow transition    Transfers Overall transfer level: Needs assistance Equipment used: Rolling walker (2 wheels) Transfers: Sit to/from Stand Sit to Stand: Min assist           General transfer comment: Min A to stand and balance    Ambulation/Gait Ambulation/Gait assistance: Min assist Gait Distance (Feet): 200 Feet Assistive device: Rolling walker (2 wheels) Gait Pattern/deviations: Step-to pattern, Decreased stride length, Trunk flexed Gait velocity: decreased     General Gait Details: Cues for RW proximity and posture.  Needing frequent min A to  maintain balance.  Fall risk.   Stairs             Wheelchair Mobility     Tilt Bed    Modified Rankin (Stroke Patients Only)       Balance Overall balance assessment: Needs assistance Sitting-balance support: No upper extremity supported Sitting balance-Leahy Scale: Good     Standing balance support: Bilateral upper extremity supported Standing balance-Leahy Scale: Poor Standing balance comment: RW and min A at times                            Cognition Arousal: Alert Behavior During Therapy: Flat affect Overall Cognitive Status: Within Functional Limits for tasks assessed                                          Exercises      General Comments        Pertinent Vitals/Pain Pain Assessment Pain Assessment: No/denies pain    Home Living                          Prior Function            PT Goals (current goals can now be found in the care plan section) Progress towards PT goals: Progressing toward goals    Frequency    Min 1X/week      PT Plan  Co-evaluation              AM-PAC PT "6 Clicks" Mobility   Outcome Measure  Help needed turning from your back to your side while in a flat bed without using bedrails?: A Little Help needed moving from lying on your back to sitting on the side of a flat bed without using bedrails?: A Little Help needed moving to and from a bed to a chair (including a wheelchair)?: A Little Help needed standing up from a chair using your arms (e.g., wheelchair or bedside chair)?: A Little Help needed to walk in hospital room?: A Little Help needed climbing 3-5 steps with a railing? : A Lot 6 Click Score: 17    End of Session Equipment Utilized During Treatment: Gait belt Activity Tolerance: Patient limited by fatigue (reports fatigued after walk) Patient left: in chair;with call bell/phone within reach;with chair alarm set Nurse Communication: Mobility  status PT Visit Diagnosis: Unsteadiness on feet (R26.81);Muscle weakness (generalized) (M62.81)     Time: 1500-1520 PT Time Calculation (min) (ACUTE ONLY): 20 min  Charges:    $Gait Training: 8-22 mins PT General Charges $$ ACUTE PT VISIT: 1 Visit                     Anise Salvo, PT Acute Rehab Services [facility] [phone]    Rayetta Humphrey 11/22/2022, 4:14 PM

## 2022-11-23 ENCOUNTER — Encounter (HOSPITAL_COMMUNITY): Payer: Self-pay | Admitting: Internal Medicine

## 2022-11-23 ENCOUNTER — Observation Stay (HOSPITAL_COMMUNITY): Payer: No Typology Code available for payment source

## 2022-11-23 DIAGNOSIS — Z66 Do not resuscitate: Secondary | ICD-10-CM | POA: Diagnosis not present

## 2022-11-23 DIAGNOSIS — Z7401 Bed confinement status: Secondary | ICD-10-CM | POA: Diagnosis not present

## 2022-11-23 DIAGNOSIS — R531 Weakness: Secondary | ICD-10-CM

## 2022-11-23 DIAGNOSIS — R41841 Cognitive communication deficit: Secondary | ICD-10-CM | POA: Diagnosis not present

## 2022-11-23 DIAGNOSIS — I129 Hypertensive chronic kidney disease with stage 1 through stage 4 chronic kidney disease, or unspecified chronic kidney disease: Secondary | ICD-10-CM | POA: Diagnosis not present

## 2022-11-23 DIAGNOSIS — J8410 Pulmonary fibrosis, unspecified: Secondary | ICD-10-CM | POA: Diagnosis not present

## 2022-11-23 DIAGNOSIS — G8323 Monoplegia of upper limb affecting right nondominant side: Secondary | ICD-10-CM | POA: Diagnosis not present

## 2022-11-23 DIAGNOSIS — E860 Dehydration: Secondary | ICD-10-CM | POA: Diagnosis not present

## 2022-11-23 DIAGNOSIS — E871 Hypo-osmolality and hyponatremia: Secondary | ICD-10-CM | POA: Diagnosis not present

## 2022-11-23 DIAGNOSIS — N1831 Chronic kidney disease, stage 3a: Secondary | ICD-10-CM | POA: Diagnosis not present

## 2022-11-23 DIAGNOSIS — I10 Essential (primary) hypertension: Secondary | ICD-10-CM | POA: Diagnosis not present

## 2022-11-23 DIAGNOSIS — M545 Low back pain, unspecified: Secondary | ICD-10-CM | POA: Diagnosis not present

## 2022-11-23 DIAGNOSIS — N3001 Acute cystitis with hematuria: Secondary | ICD-10-CM | POA: Diagnosis not present

## 2022-11-23 DIAGNOSIS — Q048 Other specified congenital malformations of brain: Secondary | ICD-10-CM | POA: Diagnosis not present

## 2022-11-23 DIAGNOSIS — I6782 Cerebral ischemia: Secondary | ICD-10-CM | POA: Diagnosis not present

## 2022-11-23 DIAGNOSIS — R471 Dysarthria and anarthria: Secondary | ICD-10-CM | POA: Diagnosis not present

## 2022-11-23 DIAGNOSIS — G40109 Localization-related (focal) (partial) symptomatic epilepsy and epileptic syndromes with simple partial seizures, not intractable, without status epilepticus: Secondary | ICD-10-CM | POA: Diagnosis not present

## 2022-11-23 DIAGNOSIS — R278 Other lack of coordination: Secondary | ICD-10-CM | POA: Diagnosis not present

## 2022-11-23 DIAGNOSIS — E785 Hyperlipidemia, unspecified: Secondary | ICD-10-CM | POA: Diagnosis not present

## 2022-11-23 DIAGNOSIS — D320 Benign neoplasm of cerebral meninges: Secondary | ICD-10-CM | POA: Diagnosis not present

## 2022-11-23 DIAGNOSIS — R1319 Other dysphagia: Secondary | ICD-10-CM | POA: Diagnosis not present

## 2022-11-23 DIAGNOSIS — N179 Acute kidney failure, unspecified: Secondary | ICD-10-CM | POA: Diagnosis not present

## 2022-11-23 DIAGNOSIS — E872 Acidosis, unspecified: Secondary | ICD-10-CM | POA: Diagnosis not present

## 2022-11-23 DIAGNOSIS — K219 Gastro-esophageal reflux disease without esophagitis: Secondary | ICD-10-CM | POA: Diagnosis not present

## 2022-11-23 DIAGNOSIS — Q046 Congenital cerebral cysts: Secondary | ICD-10-CM | POA: Diagnosis not present

## 2022-11-23 DIAGNOSIS — E038 Other specified hypothyroidism: Secondary | ICD-10-CM | POA: Diagnosis not present

## 2022-11-23 DIAGNOSIS — E8809 Other disorders of plasma-protein metabolism, not elsewhere classified: Secondary | ICD-10-CM | POA: Diagnosis not present

## 2022-11-23 DIAGNOSIS — Q043 Other reduction deformities of brain: Secondary | ICD-10-CM | POA: Diagnosis not present

## 2022-11-23 DIAGNOSIS — N390 Urinary tract infection, site not specified: Secondary | ICD-10-CM | POA: Diagnosis not present

## 2022-11-23 DIAGNOSIS — K224 Dyskinesia of esophagus: Secondary | ICD-10-CM | POA: Diagnosis not present

## 2022-11-23 DIAGNOSIS — F419 Anxiety disorder, unspecified: Secondary | ICD-10-CM | POA: Diagnosis not present

## 2022-11-23 DIAGNOSIS — G459 Transient cerebral ischemic attack, unspecified: Secondary | ICD-10-CM | POA: Diagnosis not present

## 2022-11-23 DIAGNOSIS — R2689 Other abnormalities of gait and mobility: Secondary | ICD-10-CM | POA: Diagnosis not present

## 2022-11-23 DIAGNOSIS — N3000 Acute cystitis without hematuria: Secondary | ICD-10-CM | POA: Diagnosis not present

## 2022-11-23 DIAGNOSIS — R220 Localized swelling, mass and lump, head: Secondary | ICD-10-CM | POA: Diagnosis not present

## 2022-11-23 DIAGNOSIS — E039 Hypothyroidism, unspecified: Secondary | ICD-10-CM | POA: Diagnosis not present

## 2022-11-23 DIAGNOSIS — I2510 Atherosclerotic heart disease of native coronary artery without angina pectoris: Secondary | ICD-10-CM | POA: Diagnosis not present

## 2022-11-23 DIAGNOSIS — E876 Hypokalemia: Secondary | ICD-10-CM | POA: Diagnosis not present

## 2022-11-23 DIAGNOSIS — D329 Benign neoplasm of meninges, unspecified: Secondary | ICD-10-CM | POA: Diagnosis not present

## 2022-11-23 DIAGNOSIS — M6289 Other specified disorders of muscle: Secondary | ICD-10-CM | POA: Diagnosis not present

## 2022-11-23 DIAGNOSIS — R4182 Altered mental status, unspecified: Secondary | ICD-10-CM | POA: Diagnosis not present

## 2022-11-23 DIAGNOSIS — R569 Unspecified convulsions: Secondary | ICD-10-CM | POA: Diagnosis not present

## 2022-11-23 DIAGNOSIS — Z8669 Personal history of other diseases of the nervous system and sense organs: Secondary | ICD-10-CM | POA: Diagnosis not present

## 2022-11-23 DIAGNOSIS — E7800 Pure hypercholesterolemia, unspecified: Secondary | ICD-10-CM | POA: Diagnosis not present

## 2022-11-23 DIAGNOSIS — F32A Depression, unspecified: Secondary | ICD-10-CM | POA: Diagnosis not present

## 2022-11-23 DIAGNOSIS — M6281 Muscle weakness (generalized): Secondary | ICD-10-CM | POA: Diagnosis not present

## 2022-11-23 LAB — CBC WITH DIFFERENTIAL/PLATELET
Abs Immature Granulocytes: 0.03 10*3/uL (ref 0.00–0.07)
Basophils Absolute: 0.1 10*3/uL (ref 0.0–0.1)
Basophils Relative: 1 %
Eosinophils Absolute: 0.2 10*3/uL (ref 0.0–0.5)
Eosinophils Relative: 2 %
HCT: 38.3 % (ref 36.0–46.0)
Hemoglobin: 11.7 g/dL — ABNORMAL LOW (ref 12.0–15.0)
Immature Granulocytes: 0 %
Lymphocytes Relative: 23 %
Lymphs Abs: 2.2 10*3/uL (ref 0.7–4.0)
MCH: 27.9 pg (ref 26.0–34.0)
MCHC: 30.5 g/dL (ref 30.0–36.0)
MCV: 91.2 fL (ref 80.0–100.0)
Monocytes Absolute: 1.5 10*3/uL — ABNORMAL HIGH (ref 0.1–1.0)
Monocytes Relative: 16 %
Neutro Abs: 5.6 10*3/uL (ref 1.7–7.7)
Neutrophils Relative %: 58 %
Platelets: 213 10*3/uL (ref 150–400)
RBC: 4.2 MIL/uL (ref 3.87–5.11)
RDW: 14.2 % (ref 11.5–15.5)
WBC: 9.5 10*3/uL (ref 4.0–10.5)
nRBC: 0 % (ref 0.0–0.2)

## 2022-11-23 LAB — BASIC METABOLIC PANEL
Anion gap: 9 (ref 5–15)
BUN: 19 mg/dL (ref 8–23)
CO2: 20 mmol/L — ABNORMAL LOW (ref 22–32)
Calcium: 7.6 mg/dL — ABNORMAL LOW (ref 8.9–10.3)
Chloride: 105 mmol/L (ref 98–111)
Creatinine, Ser: 1.26 mg/dL — ABNORMAL HIGH (ref 0.44–1.00)
GFR, Estimated: 44 mL/min — ABNORMAL LOW (ref >60)
Glucose, Bld: 91 mg/dL (ref 70–99)
Potassium: 3.6 mmol/L (ref 3.5–5.1)
Sodium: 134 mmol/L — ABNORMAL LOW (ref 135–145)

## 2022-11-23 LAB — MAGNESIUM: Magnesium: 1.8 mg/dL (ref 1.7–2.4)

## 2022-11-23 NOTE — Progress Notes (Signed)
PROGRESS NOTE    OUIDA [name]  ZOX:[id number] DOB: Apr 22, 1945 DOA: 11/18/2022 PCP: Merri Brunette, MD   Chief Complaint  Patient presents with   Weakness    Brief Narrative:  Patient 77 year old female history of anxiety, CKD stage III, CAD, esophageal dysmotility and stricture due to GERD presented to the ED with acute on chronic renal failure UTI and dysphagia.   Assessment & Plan:   Principal Problem:   UTI (urinary tract infection) Active Problems:   Convulsions/seizures (HCC)   Hypertension   Hyperlipidemia   Hypothyroidism   History of  seizures   Depression   GERD   Acute renal failure (HCC)   Hypokalemia   Dehydration   Hypomagnesemia  #1 UTI -Patient noted on urinalysis was cloudy, large hemoglobin, trace ketones, small leukocytes, nitrite negative, few bacteria, WBC >50. -Patient recently seen in the ED received a dose of IV Rocephin for presumed UTI, urine cultures were not obtained and patient was not sent out on further antibiotics at that time. -Urine cultures obtained with no growth to date however patient did receive IV antibiotics a few days ago when she presented to the ED. -Patient was placed on IV Rocephin during the hospitalization and has completed a 5-day course of treatment.   -No further antibiotics needed.   2.  Acute kidney injury on CKD stage IIIa -Patient noted on admission with creatinine of 2.09 with baseline creatinine of approximately 1.2. -Likely secondary to prerenal azotemia in the setting of dehydration and poor oral intake, in the setting of ARB. -Patient placed on IV fluids with improvement with renal function with creatinine currently at 1.26.  -Continue to hold ARB. -Supportive care.  3.  Dysphagia -Patient with history of esophageal dysmotility and esophageal stricture with last dilatation January 2021. -Patient noted to be followed by [facility], Dr. Marina Goodell as outpatient. -Admitting physician, Dr. Kirby Crigler discussed  briefly with Dr. Marina Goodell who knows the patient well and stated his symptoms were probably due to dysmotility and no inpatient GI consultation needed at this time. -Patient currently tolerating clear liquids.   -Esophagram done 11/19/2022 noted to be abnormal with mild esophageal dysmotility, smooth mild narrowing of the distal esophageal lumen preventing the passage of a 13 mm barium tablet, suspicious for mild recurrent stricture.  No mucosal irregularity identified.   -Case discussed with patient's primary gastroenterologist Dr. Marina Goodell who reviewed esophagram and felt patient's dysphagia was chronic and recommended outpatient follow-up for reevaluation and consideration of repeat endoscopy.   -Patient started on a dysphagia 2 diet which she seems to be tolerating.   -Follow.  3.  Hypokalemia/hypomagnesemia -Potassium and magnesium repleted.   -Potassium at 3.6.  Magnesium at 1.8.Marland Kitchen  -Repeat labs in the AM.  4.  Right upper extremity weakness -Patient noted some right upper extremity weakness per OT today. -Patient with no visual changes, no dysarthric speech.  Patient with a baseline tremor. -CT head on admission was unremarkable. -Repeat head CT.  5.  GERD -PPI.  6.  Lactic acidosis -Secondary to dehydration. -Improved with IV fluids.  7.  Hypertension -BP better controlled with resumption of home regimen of Bystolic and Cardizem.   -Continue to hold ARB due to AKI.   -IV hydralazine as needed.   8.  Anxiety -Cymbalta, Xanax.   9.  Seizure history -Keppra.   10.  Hyperlipidemia -Continue statin.    11.  Hypothyroidism -Continue Synthroid.    DVT prophylaxis: Lovenox Code Status: DNR Family Communication: Updated patient. Updated niece on  telephone.   Disposition: SNF per PT recommendations  Status is: Observation The patient remains OBS appropriate and will d/c before 2 midnights.   Consultants:  None  Procedures:  CT head 11/18/2022, 11/23/2022 Chest x-ray  11/18/2022 Esophagram 11/19/2022  Antimicrobials:  Anti-infectives (From admission, onward)    Start     Dose/Rate Route Frequency Ordered Stop   11/19/22 1100  cefTRIAXone (ROCEPHIN) 1 g in sodium chloride 0.9 % 100 mL IVPB  Status:  Discontinued        1 g 200 mL/hr over 30 Minutes Intravenous Every 24 hours 11/18/22 1333 11/19/22 0905   11/19/22 1100  cefTRIAXone (ROCEPHIN) 1 g in sodium chloride 0.9 % 100 mL IVPB        1 g 200 mL/hr over 30 Minutes Intravenous Every 24 hours 11/19/22 0905 11/22/22 2150   11/18/22 1115  cefTRIAXone (ROCEPHIN) 1 g in sodium chloride 0.9 % 100 mL IVPB        1 g 200 mL/hr over 30 Minutes Intravenous  Once 11/18/22 1114 11/18/22 1157         Subjective: Patient sitting up in bed.  States she is tolerating current diet.  Patient with complaints of generalized weakness.  Denies any chest pain or shortness of breath.  No abdominal pain.  Patient feels she has some decreased strength on the right.  Noted per therapy that patient with decreased strength on the right.  Objective: Vitals:   11/22/22 0443 11/22/22 1528 11/22/22 2013 11/23/22 0512  BP: (!) 149/76 118/65 136/73 138/73  Pulse: 73 70 71 (!) 59  Resp: 16 16 18 15   Temp: 98.5 F (36.9 C) 97.8 F (36.6 C) 98.6 F (37 C) (!) 97.4 F (36.3 C)  TempSrc: Oral Oral Oral Oral  SpO2: 98% 97% 96% 98%  Weight:      Height:        Intake/Output Summary (Last 24 hours) at 11/23/2022 1120 Last data filed at 11/23/2022 0513 Gross per 24 hour  Intake --  Output 350 ml  Net -350 ml   Filed Weights   11/18/22 0816 11/18/22 0831  Weight: 44.9 kg 44.9 kg    Examination:  General exam: NAD.   Respiratory system: Lungs clear to auscultation bilaterally.  No wheezes, no crackles, no rhonchi.  Fair air movement.  Speaking in full sentences.   Cardiovascular system: RRR no murmurs rubs or gallops.  No JVD.  No lower extremity edema.  Gastrointestinal system: Abdomen is soft, nontender,  nondistended, positive bowel sounds.  No rebound.  No guarding.  Central nervous system: Alert and oriented..  Nerves II through XII grossly intact.  Decreased right grip strength.  4/5 bilateral lower extremity strength.  4/5 left upper extremity strength.  3-4/5 right upper extremity strength.  Gait not tested secondary to safety.  Sensation intact.  Reflexes not tested.  Extremities: Symmetric 5 x 5 power. Skin: No rashes, lesions or ulcers Psychiatry: Judgement and insight appear fair. Mood & affect appropriate.     Data Reviewed: I have personally reviewed following labs and imaging studies  CBC: Recent Labs  Lab 11/18/22 0926 11/19/22 0555 11/20/22 0824 11/21/22 0744 11/22/22 0516 11/23/22 0505  WBC 7.9 7.8 7.4 9.9 13.4* 9.5  NEUTROABS 5.6  --   --   --   --  5.6  HGB 13.3 11.0* 13.4 12.8 12.2 11.7*  HCT 40.8 35.0* 44.9 40.1 38.0 38.3  MCV 86.3 90.7 94.5 87.9 88.0 91.2  PLT 284 199 189 207 253  213    Basic Metabolic Panel: Recent Labs  Lab 11/19/22 0555 11/20/22 0824 11/21/22 0744 11/21/22 1702 11/22/22 0516 11/23/22 0505  NA 140 142 139 138 132* 134*  K 2.9* 3.1* 2.3* 3.9 3.6 3.6  CL 108 111 106 108 102 105  CO2 21* 20* 22 21* 22 20*  GLUCOSE 87 91 116* 111* 107* 91  BUN 23 13 12 14 16 19   CREATININE 1.49* 1.31* 1.28* 1.44* 1.32* 1.26*  CALCIUM 7.4* 7.4* 7.3* 7.2* 7.2* 7.6*  MG 0.9* 2.2 1.2*  --  2.4 1.8  PHOS  --  2.8  --   --   --   --     GFR: Estimated Creatinine Clearance: 25.9 mL/min (A) (by C-G formula based on SCr of 1.26 mg/dL (H)).  Liver Function Tests: Recent Labs  Lab 11/18/22 0926 11/20/22 0824  AST 68*  --   ALT 28  --   ALKPHOS 63  --   BILITOT 0.7  --   PROT 7.7  --   ALBUMIN 4.5 3.2*    CBG: No results for input(s): "GLUCAP" in the last 168 hours.   Recent Results (from the past 240 hour(s))  Urine Culture     Status: None   Collection Time: 11/18/22  8:52 AM   Specimen: Urine, Clean Catch  Result Value Ref Range Status    Specimen Description   Final    URINE, CLEAN CATCH Performed at [facility], [address] [place], [place] [address]    Special Requests   Final    NONE Performed at [facility], [address], [place], [place] [address]    Culture   Final    NO GROWTH Performed at [facility], [address] [place], [place] [address]    Report Status 11/19/2022 FINAL  Final         Radiology Studies: No results found.      Scheduled Meds:  ALPRAZolam  0.25 mg Oral BID   ARIPiprazole  2 mg Oral Daily   aspirin EC  81 mg Oral Daily   calcitRIOL  0.25 mcg Oral Daily   diltiazem  180 mg Oral Daily   DULoxetine  30 mg Oral Daily   enoxaparin (LOVENOX) injection  30 mg Subcutaneous Q24H   levETIRAcetam  500 mg Oral BID   levothyroxine  88 mcg Oral Q0600   nebivolol  2.5 mg Oral BID   mouth rinse  15 mL Mouth Rinse 4 times per day   pantoprazole  40 mg Oral BID   potassium chloride  20 mEq Oral BID   rosuvastatin  40 mg Oral Daily   Continuous Infusions:     LOS: 0 days    Time spent: 40 minutes    Ramiro Harvest, MD [facility]   To contact the attending provider between 7A-7P or the covering provider during after hours 7P-7A, please log into the web site [email or web address] and access using universal [place] password for that web site. If you do not have the password, please call the hospital operator.  11/23/2022, 11:20 AM

## 2022-11-23 NOTE — Plan of Care (Signed)
  Problem: Education: Goal: Knowledge of General Education information will improve Description: Including pain rating scale, medication(s)/side effects and non-pharmacologic comfort measures Outcome: Progressing   Problem: Clinical Measurements: Goal: Diagnostic test results will improve Outcome: Progressing   Problem: Nutrition: Goal: Adequate nutrition will be maintained Outcome: Progressing   Problem: Coping: Goal: Level of anxiety will decrease Outcome: Progressing   Problem: Safety: Goal: Ability to remain free from injury will improve Outcome: Progressing

## 2022-11-23 NOTE — Plan of Care (Signed)
  Problem: Coping: Goal: Level of anxiety will decrease Outcome: Progressing   Problem: Safety: Goal: Ability to remain free from injury will improve Outcome: Progressing   Problem: Skin Integrity: Goal: Risk for impaired skin integrity will decrease Outcome: Progressing   

## 2022-11-23 NOTE — Progress Notes (Signed)
Occupational Therapy Treatment Patient Details Name: [name] MRN: [id number] DOB: 1945/10/25 Today's Date: 11/23/2022   History of present illness [name] is a 77 y.o. female admitted with acute on chronic renal failure, UTI, and dysphagia. PMH: anxiety, CKD stage III, CAD, esophageal dysmotility and stricture due to GERD   OT comments  Pt reporting she wants to get home to her dog. Requires minA for bed mobility, minA to power up to stand and max multimodal cues to maintain upright. Significant posterior lean and forward flexed posture, reliant on AD use for balance. Slow, shuffling steps with RW to recliner, minA to control descent. On attempts to work on LB dressing and don/doff socks, pt presents with significantly decreased R hand coordination, R grip strength weaker than L, and decreased R ROM. Unable to don socks bilaterally, and requires modA to don from seated position. Pt states she is L handed, and R side has "arthritis" and is "always like this", denies new onset weakness/numbness/tingling, changes in vision or new balance deficits. OT contacts RN and MD with concerns immediately. Pt is a high risk for falls with current balance deficits. OT will continue to follow for functional gains.       If plan is discharge home, recommend the following:  A lot of help with bathing/dressing/bathroom;Assistance with cooking/housework;Direct supervision/assist for medications management;Assist for transportation;Help with stairs or ramp for entrance;Direct supervision/assist for financial management;A lot of help with walking and/or transfers   Equipment Recommendations  None recommended by OT       Precautions / Restrictions Precautions Precautions: Fall Precaution Comments: HOH Restrictions Weight Bearing Restrictions: No       Mobility Bed Mobility Overal bed mobility: Needs Assistance Bed Mobility: Supine to Sit     Supine to sit: Min assist     General bed  mobility comments: increased time, movements effortful    Transfers Overall transfer level: Needs assistance Equipment used: Rolling walker (2 wheels) Transfers: Sit to/from Stand Sit to Stand: Min assist           General transfer comment: significant posterior lean with forward flexed posture, reliant on AD for balance standing, unable to come fully upright even with cues and increasd time     Balance Overall balance assessment: Needs assistance Sitting-balance support: Bilateral upper extremity supported, Feet supported Sitting balance-Leahy Scale: Fair   Postural control: Posterior lean Standing balance support: Bilateral upper extremity supported, Reliant on assistive device for balance, During functional activity Standing balance-Leahy Scale: Poor                             ADL either performed or assessed with clinical judgement   ADL Overall ADL's : Needs assistance/impaired     Grooming: Minimal assistance;Sitting Grooming Details (indicate cue type and reason): washes face using only L hand             Lower Body Dressing: Minimal assistance;Moderate assistance Lower Body Dressing Details (indicate cue type and reason): pt using figure four technique to attempt donning/doffing socks, poor bilateral coordination with pt attempting to use primarily L hand. Toilet Transfer: Minimal assistance;Ambulation;Rolling walker (2 wheels) Toilet Transfer Details (indicate cue type and reason): slow gait pattern, forward flexed posture and pt unable to correct upright, even with tactile and verbal cues.         Functional mobility during ADLs: Rolling walker (2 wheels);Minimal assistance;Moderate assistance      Extremity/Trunk Assessment Upper  Extremity Assessment Upper Extremity Assessment: RUE deficits/detail RUE Deficits / Details: on OT eval, shoulder able to range to 90 degrees. 0-45 degrees ROM present this date with audible creptius. Pt with  significantly poor RUE coordination. RUE Sensation:  (pt denies numbness, tingling or new onset weakness) RUE Coordination: decreased gross motor;decreased fine motor   Lower Extremity Assessment Lower Extremity Assessment: Generalized weakness        Vision   Additional Comments: pt denies changes          Cognition Arousal: Alert Behavior During Therapy: Flat affect Overall Cognitive Status: Within Functional Limits for tasks assessed                                                General Comments R hand swelling noted    Pertinent Vitals/ Pain       Pain Assessment Pain Assessment: No/denies pain         Frequency  Min 1X/week        Progress Toward Goals  OT Goals(current goals can now be found in the care plan section)  Progress towards OT goals: Progressing toward goals  Acute Rehab OT Goals OT Goal Formulation: With patient Time For Goal Achievement: 12/04/22 Potential to Achieve Goals: Fair  Plan         AM-PAC OT "6 Clicks" Daily Activity     Outcome Measure   Help from another person eating meals?: A Little Help from another person taking care of personal grooming?: A Little Help from another person toileting, which includes using toliet, bedpan, or urinal?: A Lot Help from another person bathing (including washing, rinsing, drying)?: A Lot Help from another person to put on and taking off regular upper body clothing?: A Little Help from another person to put on and taking off regular lower body clothing?: A Lot 6 Click Score: 15    End of Session Equipment Utilized During Treatment: Gait belt;Rolling walker (2 wheels)  OT Visit Diagnosis: Unsteadiness on feet (R26.81);Other abnormalities of gait and mobility (R26.89);Muscle weakness (generalized) (M62.81)   Activity Tolerance Patient tolerated treatment well   Patient Left in chair;with call bell/phone within reach;with family/visitor present   Nurse Communication  Mobility status;Other (comment) (R UE deficits)        Time: 1035-1105 OT Time Calculation (min): 30 min  Charges: OT General Charges $OT Visit: 1 Visit OT Treatments $Self Care/Home Management : 23-37 mins Macallister Ashmead L. Anaika Santillano, OTR/L  11/23/22, 12:08 PM

## 2022-11-23 NOTE — [place] FL2 (Signed)
[place] MEDICAID FL2 LEVEL OF CARE FORM     IDENTIFICATION  Patient Name: [name] Birthdate: [birth year] Sex: female Admission Date (Current Location): 11/18/2022  [facility] and [place] Number:  Producer, television/film/video and Address:  [facility],  [address] [place], [place] [address]      Provider Number: [id number]  Attending Physician Name and Address:  Rodolph Bong, MD  Relative Name and Phone Number:  [name] (Niece)  [phone]    Current Level of Care: Hospital Recommended Level of Care: Skilled Nursing Facility Prior Approval Number:    Date Approved/Denied:   PASRR Number: Pending  Discharge Plan: SNF    Current Diagnoses: Patient Active Problem List   Diagnosis Date Noted   UTI (urinary tract infection) 11/18/2022   NSTEMI (non-ST elevated myocardial infarction) (HCC) 09/16/2021   Elevated troponin 09/15/2021   Stage 3b chronic kidney disease (CKD) (HCC) 09/15/2021   Partial seizure disorder (HCC)    DVT (deep venous thrombosis) (HCC) 08/19/2017   Orthostatic hypotension 08/18/2017   Diarrhea 08/18/2017   Mild aortic stenosis    Long-term current use of opiate analgesic 04/06/2017   Chronic low back pain 04/06/2017   Chronic neck pain 04/06/2017   Chronic pain syndrome 04/06/2017   Hyperphosphatemia 11/08/2016   Trigger finger, acquired 09/30/2015   Carpal tunnel syndrome, bilateral 07/31/2015   Abnormal CAT scan 05/04/2015   Breathlessness on exertion 05/04/2015   Abnormal chest CT 05/04/2015   Anemia in chronic kidney disease (CODE) 04/29/2015   Bone disease, metabolic 04/29/2015   Microscopic hematuria 04/29/2015   Avitaminosis D 04/29/2015   Normocytic anemia 03/01/2015   Syncope due to orthostatic hypotension 03/01/2015   Dehydration 03/01/2015   Hypomagnesemia 03/01/2015   Generalized weakness 02/23/2015   Hypokalemia 02/23/2015   AKI (acute kidney injury) (HCC) 02/23/2015   Acute bronchitis 02/23/2015    Hypocalcemia 02/23/2015   ILD (interstitial lung disease) (HCC) 01/24/2015   Chronic respiratory failure (HCC) 12/26/2014   Pulmonary fibrosis (HCC) 12/26/2014   Dyspnea and respiratory abnormality 11/22/2014   Abdominal pain 10/15/2014   Pain in the chest    RUQ abdominal pain    Shortness of breath 10/14/2014   Odynophagia    History of esophageal stricture    Esophageal dysmotility    Renal insufficiency 02/14/2014   Acute renal failure (HCC) 02/14/2014   Nephrolithiasis 02/13/2014   Gross hematuria 02/13/2014   Convulsions/seizures (HCC) 07/26/2013   Pericardial cyst    Mediastinal mass 02/05/2013   Cervical spondylosis 11/17/2011   Chest pain 11/16/2011   Hypertension    Hyperlipidemia    Hypothyroidism    History of  seizures    Depression    Carpal tunnel syndrome    Paroxysmal SVT (supraventricular tachycardia) (HCC) 01/02/2011   Coronary atherosclerosis    ANEMIA-UNSPECIFIED 12/12/2009   History of drug coated stent 12/01/2009   GERD 01/19/2008   IBS 01/19/2008    Orientation RESPIRATION BLADDER Height & Weight     Self, Time, Situation, Place  Normal Incontinent Weight: 99 lb (44.9 kg) Height:  4\' 11"  (149.9 cm)  BEHAVIORAL SYMPTOMS/MOOD NEUROLOGICAL BOWEL NUTRITION STATUS      Continent Diet (See discharge summary)  AMBULATORY STATUS COMMUNICATION OF NEEDS Skin   Limited Assist Verbally Normal                       Personal Care Assistance Level of Assistance  Bathing, Feeding, Dressing Bathing Assistance: Limited assistance Feeding assistance: Limited  assistance Dressing Assistance: Limited assistance     Functional Limitations Info  Sight, Hearing, Speech Sight Info: Adequate Hearing Info: Impaired Speech Info: Impaired    SPECIAL CARE FACTORS FREQUENCY  PT (By licensed PT), OT (By licensed OT)     PT Frequency: 5x/wk OT Frequency: 5x/wk            Contractures Contractures Info: Not present    Additional Factors Info  Code  Status, Allergies, Psychotropic Code Status Info: DNR Allergies Info: No Known Allergies Psychotropic Info: See MAR         Current Medications (11/23/2022):  This is the current hospital active medication list Current Facility-Administered Medications  Medication Dose Route Frequency Provider Last Rate Last Admin   acetaminophen (TYLENOL) tablet 650 mg  650 mg Oral Q6H PRN Kirby Crigler, Mir M, MD   650 mg at 11/22/22 8756   Or   acetaminophen (TYLENOL) suppository 650 mg  650 mg Rectal Q6H PRN Kirby Crigler, Mir M, MD       albuterol (PROVENTIL) (2.5 MG/3ML) 0.083% nebulizer solution 2.5 mg  2.5 mg Nebulization Q2H PRN Kirby Crigler, Mir M, MD       ALPRAZolam Prudy Feeler) tablet 0.25 mg  0.25 mg Oral BID Rodolph Bong, MD   0.25 mg at 11/23/22 1111   ARIPiprazole (ABILIFY) tablet 2 mg  2 mg Oral Daily Rodolph Bong, MD   2 mg at 11/23/22 1110   aspirin EC tablet 81 mg  81 mg Oral Daily Rodolph Bong, MD   81 mg at 11/23/22 1125   calcitRIOL (ROCALTROL) capsule 0.25 mcg  0.25 mcg Oral Daily Rodolph Bong, MD   0.25 mcg at 11/23/22 1110   diltiazem (CARDIZEM CD) 24 hr capsule 180 mg  180 mg Oral Daily Rodolph Bong, MD   180 mg at 11/23/22 1109   DULoxetine (CYMBALTA) DR capsule 30 mg  30 mg Oral Daily Rodolph Bong, MD   30 mg at 11/23/22 1108   enoxaparin (LOVENOX) injection 30 mg  30 mg Subcutaneous Q24H Kirby Crigler, Mir M, MD   30 mg at 11/22/22 1609   hydrALAZINE (APRESOLINE) injection 5 mg  5 mg Intravenous Q6H PRN Kirby Crigler, Mir M, MD   5 mg at 11/21/22 1219   levETIRAcetam (KEPPRA) tablet 500 mg  500 mg Oral BID Norva Pavlov, RPH   500 mg at 11/23/22 1109   levothyroxine (SYNTHROID) tablet 88 mcg  88 mcg Oral Q0600 Rodolph Bong, MD   88 mcg at 11/23/22 0554   LORazepam (ATIVAN) injection 0.5 mg  0.5 mg Intravenous BID PRN Rodolph Bong, MD   0.5 mg at 11/20/22 0119   nebivolol (BYSTOLIC) tablet 2.5 mg  2.5 mg Oral BID Rodolph Bong, MD   2.5  mg at 11/23/22 1109   ondansetron (ZOFRAN) tablet 4 mg  4 mg Oral Q6H PRN Kirby Crigler, Mir M, MD       Or   ondansetron [facility]) injection 4 mg  4 mg Intravenous Q6H PRN Maryln Gottron, MD       Oral care mouth rinse  15 mL Mouth Rinse 4 times per day Rodolph Bong, MD   15 mL at 11/23/22 1113   Oral care mouth rinse  15 mL Mouth Rinse PRN Rodolph Bong, MD       pantoprazole (PROTONIX) EC tablet 40 mg  40 mg Oral BID Rodolph Bong, MD   40 mg at 11/23/22 1108   potassium chloride (  KLOR-CON) packet 20 mEq  20 mEq Oral BID Rodolph Bong, MD   20 mEq at 11/23/22 1109   rosuvastatin (CRESTOR) tablet 40 mg  40 mg Oral Daily Rodolph Bong, MD   40 mg at 11/23/22 1108     Discharge Medications: Please see discharge summary for a list of discharge medications.  Relevant Imaging Results:  Relevant Lab Results:   Additional Information SSN: [id number]  Otelia Santee, LCSW

## 2022-11-23 NOTE — Progress Notes (Signed)
Physical Therapy Treatment Patient Details Name: [name] MRN: [id number] DOB: Apr 07, 1945 Today's Date: 11/23/2022   History of Present Illness [name] is a 77 y.o. female admitted with acute on chronic renal failure, UTI, and dysphagia. PMH: anxiety, CKD stage III, CAD, esophageal dysmotility and stricture due to GERD    PT Comments  Pt agreeable to working with therapy. Edema present in UEs/LEs. Min A for safe mobility. Pt is at risk for falls when mobilizing. Patient will benefit from continued inpatient follow up therapy, <3 hours/day. Will continue to follow and progress activity as tolerated.     If plan is discharge home, recommend the following: A little help with walking and/or transfers;A little help with bathing/dressing/bathroom;Assistance with cooking/housework;Help with stairs or ramp for entrance;Assist for transportation   Can travel by private vehicle     No  Equipment Recommendations  Rolling walker (2 wheels) (youth height)    Recommendations for Other Services       Precautions / Restrictions Precautions Precautions: Fall Precaution Comments: HOH Restrictions Weight Bearing Restrictions: No     Mobility  Bed Mobility Overal bed mobility: Needs Assistance Bed Mobility: Supine to Sit     Supine to sit: Contact guard, HOB elevated, Used rails     General bed mobility comments: increased time, movements effortful. cues provided.    Transfers Overall transfer level: Needs assistance Equipment used: Rolling walker (2 wheels) Transfers: Sit to/from Stand Sit to Stand: Min assist           General transfer comment: assist to rise, steady, control descent. pt attempted to sit before properly positioned in front of recliner. cues for safety, hand placement. increased time.    Ambulation/Gait Ambulation/Gait assistance: Min assist Gait Distance (Feet): 25 Feet Assistive device: Rolling walker (2 wheels) Gait Pattern/deviations:  Decreased stride length, Decreased step length - right, Decreased step length - left, Trunk flexed       General Gait Details: cues for safety, posure. assist to stabilize throughout distance. fall risk present. pt tolerated distance well-preferred to remain in room on today.   Stairs             Wheelchair Mobility     Tilt Bed    Modified Rankin (Stroke Patients Only)       Balance Overall balance assessment: Needs assistance         Standing balance support: Bilateral upper extremity supported, Reliant on assistive device for balance, During functional activity Standing balance-Leahy Scale: Poor                              Cognition Arousal: Alert Behavior During Therapy: Flat affect Overall Cognitive Status: Within Functional Limits for tasks assessed                                 General Comments: HOH, tremor present        Exercises      General Comments        Pertinent Vitals/Pain Pain Assessment Pain Assessment: No/denies pain    Home Living                          Prior Function            PT Goals (current goals can now be found in the care plan section) Progress towards  PT goals: Progressing toward goals    Frequency    Min 1X/week      PT Plan      Co-evaluation              AM-PAC PT "6 Clicks" Mobility   Outcome Measure  Help needed turning from your back to your side while in a flat bed without using bedrails?: None Help needed moving from lying on your back to sitting on the side of a flat bed without using bedrails?: A Little Help needed moving to and from a bed to a chair (including a wheelchair)?: A Little Help needed standing up from a chair using your arms (e.g., wheelchair or bedside chair)?: A Little Help needed to walk in hospital room?: A Little Help needed climbing 3-5 steps with a railing? : A Lot 6 Click Score: 18    End of Session Equipment Utilized  During Treatment: Gait belt Activity Tolerance: Patient tolerated treatment well Patient left: in chair;with call bell/phone within reach;with chair alarm set   PT Visit Diagnosis: Unsteadiness on feet (R26.81);Muscle weakness (generalized) (M62.81);Other abnormalities of gait and mobility (R26.89)     Time: 1610-9604 PT Time Calculation (min) (ACUTE ONLY): 12 min  Charges:    $Gait Training: 8-22 mins PT General Charges $$ ACUTE PT VISIT: 1 Visit                         [name], PT Acute Rehabilitation  Office: [phone]  .

## 2022-11-23 NOTE — TOC Progression Note (Signed)
Transition of Care [facility]) - Progression Note    Patient Details  Name: ELAN TETZLOFF MRN: [id number] Date of Birth: 06-03-1945  Transition of Care [facility]) CM/SW Contact  Otelia Santee, LCSW Phone Number: 11/23/2022, 1:06 PM  Clinical Narrative:    Met with pt to discuss recommendation fort SNF placement. Pt is agreeable to placement and states she has not been to SNF in the past.  Pt's PASRR under review and will upload requested documents once FL-2 co-signed by MD.      Barriers to Discharge: SNF Pending bed offer, Awaiting State Approval (PASRR)  Expected Discharge Plan and Services                                               Social Determinants of Health (SDOH) Interventions SDOH Screenings   Food Insecurity: No Food Insecurity (11/18/2022)  Housing: Low Risk  (11/18/2022)  Transportation Needs: No Transportation Needs (11/18/2022)  Utilities: Not At Risk (11/18/2022)  Tobacco Use: Medium Risk (11/18/2022)    Readmission Risk Interventions    11/19/2022   11:49 AM  Readmission Risk Prevention Plan  Transportation Screening Complete  PCP or Specialist Appt within 5-7 Days Complete  Home Care Screening Complete  Medication Review (RN CM) Complete

## 2022-11-24 DIAGNOSIS — I10 Essential (primary) hypertension: Secondary | ICD-10-CM | POA: Diagnosis not present

## 2022-11-24 DIAGNOSIS — Z8669 Personal history of other diseases of the nervous system and sense organs: Secondary | ICD-10-CM

## 2022-11-24 DIAGNOSIS — E038 Other specified hypothyroidism: Secondary | ICD-10-CM | POA: Diagnosis not present

## 2022-11-24 DIAGNOSIS — R569 Unspecified convulsions: Secondary | ICD-10-CM | POA: Diagnosis not present

## 2022-11-24 DIAGNOSIS — N3000 Acute cystitis without hematuria: Secondary | ICD-10-CM | POA: Diagnosis not present

## 2022-11-24 LAB — HEPATIC FUNCTION PANEL
ALT: 23 U/L (ref 0–44)
AST: 35 U/L (ref 15–41)
Albumin: 2.5 g/dL — ABNORMAL LOW (ref 3.5–5.0)
Alkaline Phosphatase: 65 U/L (ref 38–126)
Bilirubin, Direct: 0.1 mg/dL (ref 0.0–0.2)
Indirect Bilirubin: 0.4 mg/dL (ref 0.3–0.9)
Total Bilirubin: 0.5 mg/dL (ref <1.2)
Total Protein: 5.5 g/dL — ABNORMAL LOW (ref 6.5–8.1)

## 2022-11-24 LAB — PHOSPHORUS: Phosphorus: 3 mg/dL (ref 2.5–4.6)

## 2022-11-24 LAB — BASIC METABOLIC PANEL
Anion gap: 8 (ref 5–15)
BUN: 22 mg/dL (ref 8–23)
CO2: 21 mmol/L — ABNORMAL LOW (ref 22–32)
Calcium: 7.9 mg/dL — ABNORMAL LOW (ref 8.9–10.3)
Chloride: 104 mmol/L (ref 98–111)
Creatinine, Ser: 1.29 mg/dL — ABNORMAL HIGH (ref 0.44–1.00)
GFR, Estimated: 43 mL/min — ABNORMAL LOW (ref >60)
Glucose, Bld: 94 mg/dL (ref 70–99)
Potassium: 3.8 mmol/L (ref 3.5–5.1)
Sodium: 133 mmol/L — ABNORMAL LOW (ref 135–145)

## 2022-11-24 LAB — CBC
HCT: 35.8 % — ABNORMAL LOW (ref 36.0–46.0)
Hemoglobin: 11 g/dL — ABNORMAL LOW (ref 12.0–15.0)
MCH: 28 pg (ref 26.0–34.0)
MCHC: 30.7 g/dL (ref 30.0–36.0)
MCV: 91.1 fL (ref 80.0–100.0)
Platelets: 231 10*3/uL (ref 150–400)
RBC: 3.93 MIL/uL (ref 3.87–5.11)
RDW: 14.1 % (ref 11.5–15.5)
WBC: 6.9 10*3/uL (ref 4.0–10.5)
nRBC: 0 % (ref 0.0–0.2)

## 2022-11-24 LAB — MAGNESIUM: Magnesium: 1.6 mg/dL — ABNORMAL LOW (ref 1.7–2.4)

## 2022-11-24 MED ORDER — MAGNESIUM SULFATE 2 GM/50ML IV SOLN
2.0000 g | Freq: Once | INTRAVENOUS | Status: AC
  Administered 2022-11-24: 2 g via INTRAVENOUS
  Filled 2022-11-24: qty 50

## 2022-11-24 NOTE — Progress Notes (Signed)
PROGRESS NOTE    [name]  VWU:[id number] DOB: [birth year] DOA: 11/18/2022 PCP: Merri Brunette, MD   Brief Narrative:  The patient 77 year old female history of anxiety, CKD stage IIIa, CAD, esophageal dysmotility and stricture due to GERD presented to the ED with acute on chronic renal failure UTI and dysphagia.  She completed a 5-day course of IV ceftriaxone for her UTI and renal function started slowly improving.  Her dysphagia was worked up and the case was discussed with GI who recommended no inpatient GI consultation at this time given that she is tolerating tolerating liquids.  Subsequently she started on a dysphagia 2 diet which she is seem to be tolerating.  She continues to be extremely weak and so PT OT recommending SNF.  Assessment and Plan:  Suspected UTI -Patient noted on urinalysis was cloudy, large hemoglobin, trace ketones, small leukocytes, nitrite negative, few bacteria, WBC >50. -Patient recently seen in the ED received a dose of IV -Rocephin for presumed UTI, urine cultures were not obtained and patient was not sent out on further antibiotics at that time. -Urine cultures obtained with no growth to date however patient did receive IV antibiotics a few days ago when she presented to the ED. -Patient was placed on IV Ceftriaxone during the hospitalization and has completed a 5-day course of treatment.   -No further antibiotics needed.    Acute Kidney Injury on CKD stage IIIa Metabolic Acidosis -Patient noted on admission with creatinine of 2.09 with baseline creatinine of approximately 1.2 -BUN/Cr Trend: Recent Labs  Lab 11/19/22 0555 11/20/22 0824 11/21/22 0744 11/21/22 1702 11/22/22 0516 11/23/22 0505 11/24/22 0516  BUN 23 13 12 14 16 19 22   CREATININE 1.49* 1.31* 1.28* 1.44* 1.32* 1.26* 1.29*  -Patient has a metabolic acidosis with a CO2 of 21, anion gap of 8, chloride of 104 -Likely secondary to prerenal azotemia in the setting of dehydration and poor  oral intake, in the setting of ARB. -Improved with IVF hydration  -Avoid Nephrotoxic Medications and continue to hold ARB, Contrast Dyes, Hypotension and Dehydration to Ensure Adequate Renal Perfusion and will need to Renally Adjust Meds -Continue to Monitor and Trend Renal Function carefully and repeat CMP in the AM  -C/w  Supportive care.   Dysphagia -Patient with history of esophageal dysmotility and esophageal stricture with last dilatation January 2021. -Patient noted to be followed by [facility], Dr. Marina Goodell as outpatient. -Admitting physician, Dr. Kirby Crigler discussed briefly with Dr. Marina Goodell who knows the patient well and stated his symptoms were probably due to dysmotility and no inpatient GI consultation needed at this time. -Patient currently tolerating clear liquids.   -Esophagram done 11/19/2022 noted to be abnormal with mild esophageal dysmotility, smooth mild narrowing of the distal esophageal lumen preventing the passage of a 13 mm barium tablet, suspicious for mild recurrent stricture.  No mucosal irregularity identified.   -Case discussed with patient's primary gastroenterologist Dr. Marina Goodell who reviewed esophagram and felt patient's dysphagia was chronic and recommended outpatient follow-up for reevaluation and consideration of repeat endoscopy.   -Patient started on a dysphagia 2 diet which she seems to be tolerating.   -Follow.   Electrolyte Abnormalities including Hyponatremia, Hypokalemia, and Hypomagnesemia -Na+,K+, and Mag Trend: Recent Labs  Lab 11/19/22 0555 11/20/22 0824 11/21/22 0744 11/21/22 1702 11/22/22 0516 11/23/22 0505 11/24/22 0515 11/24/22 0516  NA 140 142 139 138 132* 134*  --  133*  K 2.9* 3.1* 2.3* 3.9 3.6 3.6  --  3.8  MG 0.9* 2.2  1.2*  --  2.4 1.8 1.6*  --   -Replete with IV Mag Sulfate 2 grams -Continue to Monitor and Trend and repeat CMP, Mag in the AM   Right Upper Extremity Weakness -Patient noted some right upper extremity weakness per OT  yesterday which was worse on the left side and had decreased right range of motion. -Patient with no visual changes, no dysarthric speech.   -She denied any new onset weakness, numbness and tingling or changes in her vision or deficits in balance and relates her right-sided weakness to arthritis -Patient with a baseline Left arm tremor. -CT head on admission was unremarkable. -Repeat head CT done and showed "No evidence of acute intracranial abnormality. Chronic left perisylvian polymicrogyria. Chronic microvascular ischemic change."   GERD/GI Prophylaxis -C/w PPI with Pantoprazole 40 mg po BID   Lactic Acidosis -Secondary to dehydration. -Lactic Acid Level Trend: Recent Labs  Lab 11/15/22 1107 11/15/22 1437  LATICACIDVEN 2.0* 1.8  -Improved with IV fluids.  Hypertension -BP better controlled with resumption of home regimen of Nebivolol 2.5 mg po BID and Diltiazem 180 mg po Daily.   -Continue to hold ARB due to AKI.   -C/w IV Hydralazine 5 mg q6hprn HBP >160 -Continue to Monitor BP per Protcol -Last BP reading was    Anxiety -C/w Duloxetine 30 mg po Daily and Lorazepam 500 mg po BID   Seizure History -C/w Levetiracetam 500 mg po BID -C/w Seizure Precautions   Hyperlipidemia -Continue Rosuvastatin.     Hypothyroidism -Continue Levothyroxine 88 mcg po Daily.  Normocytic Anemia -Hgb/Hct Trend: Recent Labs  Lab 11/18/22 0926 11/19/22 0555 11/20/22 0824 11/21/22 0744 11/22/22 0516 11/23/22 0505 11/24/22 0516  HGB 13.3 11.0* 13.4 12.8 12.2 11.7* 11.0*  HCT 40.8 35.0* 44.9 40.1 38.0 38.3 35.8*  MCV 86.3 90.7 94.5 87.9 88.0 91.2 91.1  -Check Anemia Panel in the AM -Continue to Monitor for S/Sx of Bleeding; no overt bleeding -Repeat CBC in the AM  Hypoalbuminemia -Patient's Albumin Trend: Recent Labs  Lab 11/15/22 1106 11/18/22 0926 11/20/22 0824 11/24/22 0515  ALBUMIN 3.7 4.5 3.2* 2.5*  -Continue to Monitor and Trend and repeat CMP in the AM   DVT  prophylaxis: Place TED hose Start: 11/21/22 1846 enoxaparin (LOVENOX) injection 30 mg Start: 11/18/22 1600 SCDs Start: 11/18/22 1333    Code Status: Limited: Do not attempt resuscitation (DNR) -DNR-LIMITED -Do Not Intubate/DNI  Family Communication: No family present at bedside   Disposition Plan:  Level of care: Med-Surg Status is: Inpatient Remains inpatient appropriate because: Needs further clinical improvement and English as a second language teacher for SNF   Consultants:  None  Procedures:  As delineated as above   Antimicrobials:  Anti-infectives (From admission, onward)    Start     Dose/Rate Route Frequency Ordered Stop   11/19/22 1100  cefTRIAXone (ROCEPHIN) 1 g in sodium chloride 0.9 % 100 mL IVPB  Status:  Discontinued        1 g 200 mL/hr over 30 Minutes Intravenous Every 24 hours 11/18/22 1333 11/19/22 0905   11/19/22 1100  cefTRIAXone (ROCEPHIN) 1 g in sodium chloride 0.9 % 100 mL IVPB        1 g 200 mL/hr over 30 Minutes Intravenous Every 24 hours 11/19/22 0905 11/22/22 2150   11/18/22 1115  cefTRIAXone (ROCEPHIN) 1 g in sodium chloride 0.9 % 100 mL IVPB        1 g 200 mL/hr over 30 Minutes Intravenous  Once 11/18/22 1114 11/18/22 1157  Subjective: Seen and examined at bedside she is sitting in a chair and feels okay.  Denies any nausea or vomiting.  No lightheadedness or dizziness.  No other concerns or complaints at this time.  Objective: Vitals:   11/23/22 1959 11/24/22 0541 11/24/22 1143 11/24/22 1511  BP: 122/87 131/65 136/61 118/67  Pulse: 76 (!) 56 60 60  Resp: 18 18  16   Temp: 98.3 F (36.8 C) 98.3 F (36.8 C)  (!) 97.5 F (36.4 C)  TempSrc: Oral Oral  Oral  SpO2: 97% 99%  98%  Weight:      Height:        Intake/Output Summary (Last 24 hours) at 11/24/2022 1710 Last data filed at 11/24/2022 0345 Gross per 24 hour  Intake --  Output 450 ml  Net -450 ml   Filed Weights   11/18/22 0816 11/18/22 0831  Weight: 44.9 kg 44.9 kg    Examination: Physical Exam:  Constitutional: Elderly chronically ill-appearing Caucasian female in no acute distress Respiratory: Diminished to auscultation bilaterally, no wheezing, rales, rhonchi or crackles. Normal respiratory effort and patient is not tachypenic. No accessory muscle use.  Unlabored breathing Cardiovascular: RRR, no murmurs / rubs / gallops. S1 and S2 auscultated. No extremity edema. Abdomen: Soft, non-tender, non-distended. Bowel sounds positive.  GU: Deferred. Musculoskeletal: No joint deformity upper and lower extremities.  Skin: No rashes, lesions, ulcers on limited skin evaluation. No induration; Warm and dry.  Neurologic: CN 2-12 grossly intact with no focal deficits.  Has a tremor noted. Marland Kitchen  Psychiatric: Normal judgment and insight. Alert and oriented x 3. Normal mood and appropriate affect.   Data Reviewed: I have personally reviewed following labs and imaging studies  CBC: Recent Labs  Lab 11/18/22 0926 11/19/22 0555 11/20/22 0824 11/21/22 0744 11/22/22 0516 11/23/22 0505 11/24/22 0516  WBC 7.9   < > 7.4 9.9 13.4* 9.5 6.9  NEUTROABS 5.6  --   --   --   --  5.6  --   HGB 13.3   < > 13.4 12.8 12.2 11.7* 11.0*  HCT 40.8   < > 44.9 40.1 38.0 38.3 35.8*  MCV 86.3   < > 94.5 87.9 88.0 91.2 91.1  PLT 284   < > 189 207 253 213 231   < > = values in this interval not displayed.   Basic Metabolic Panel: Recent Labs  Lab 11/20/22 0824 11/21/22 0744 11/21/22 1702 11/22/22 0516 11/23/22 0505 11/24/22 0515 11/24/22 0516  NA 142 139 138 132* 134*  --  133*  K 3.1* 2.3* 3.9 3.6 3.6  --  3.8  CL 111 106 108 102 105  --  104  CO2 20* 22 21* 22 20*  --  21*  GLUCOSE 91 116* 111* 107* 91  --  94  BUN 13 12 14 16 19   --  22  CREATININE 1.31* 1.28* 1.44* 1.32* 1.26*  --  1.29*  CALCIUM 7.4* 7.3* 7.2* 7.2* 7.6*  --  7.9*  MG 2.2 1.2*  --  2.4 1.8 1.6*  --   PHOS 2.8  --   --   --   --  3.0  --    GFR: Estimated Creatinine Clearance: 25.3 mL/min (A) (by  C-G formula based on SCr of 1.29 mg/dL (H)). Liver Function Tests: Recent Labs  Lab 11/18/22 0926 11/20/22 0824 11/24/22 0515  AST 68*  --  35  ALT 28  --  23  ALKPHOS 63  --  65  BILITOT  0.7  --  0.5  PROT 7.7  --  5.5*  ALBUMIN 4.5 3.2* 2.5*   No results for input(s): "LIPASE", "AMYLASE" in the last 168 hours. No results for input(s): "AMMONIA" in the last 168 hours. Coagulation Profile: No results for input(s): "INR", "PROTIME" in the last 168 hours. Cardiac Enzymes: No results for input(s): "CKTOTAL", "CKMB", "CKMBINDEX", "TROPONINI" in the last 168 hours. BNP (last 3 results) No results for input(s): "PROBNP" in the last 8760 hours. HbA1C: No results for input(s): "HGBA1C" in the last 72 hours. CBG: No results for input(s): "GLUCAP" in the last 168 hours. Lipid Profile: No results for input(s): "CHOL", "HDL", "LDLCALC", "TRIG", "CHOLHDL", "LDLDIRECT" in the last 72 hours. Thyroid Function Tests: No results for input(s): "TSH", "T4TOTAL", "FREET4", "T3FREE", "THYROIDAB" in the last 72 hours. Anemia Panel: No results for input(s): "VITAMINB12", "FOLATE", "FERRITIN", "TIBC", "IRON", "RETICCTPCT" in the last 72 hours. Sepsis Labs: No results for input(s): "PROCALCITON", "LATICACIDVEN" in the last 168 hours.  Recent Results (from the past 240 hour(s))  Urine Culture     Status: None   Collection Time: 11/18/22  8:52 AM   Specimen: Urine, Clean Catch  Result Value Ref Range Status   Specimen Description   Final    URINE, CLEAN CATCH Performed at [facility], [address], [place], [place] [address]    Special Requests   Final    NONE Performed at [facility], [address] [place], [place] [address]    Culture   Final    NO GROWTH Performed at [facility], [address], [place], [place] [address]    Report Status 11/19/2022 FINAL  Final    Radiology Studies: CT HEAD WO CONTRAST ( )  Result Date:  11/23/2022 CLINICAL DATA:  Transient ischemic attack (TIA) RUE weakness/discordination EXAM: CT HEAD WITHOUT CONTRAST TECHNIQUE: Contiguous axial images were obtained from the base of the skull through the vertex without intravenous contrast. RADIATION DOSE REDUCTION: This exam was performed according to the departmental dose-optimization program which includes automated exposure control, adjustment of the mA and/or kV according to patient size and/or use of iterative reconstruction technique. COMPARISON:  CT head November 18, 2022. FINDINGS: Brain: No evidence of acute large vascular territory infarct, acute hemorrhage, mass lesion, midline shift or hydrocephalus. Chronic left perisylvian polymicrogyria. Small remote pontine infarct. Patchy additional white matter hypodensities are nonspecific but compatible with chronic microvascular ischemic disease. Vascular: No hyperdense vessel identified. Skull: No acute fracture. Sinuses/Orbits: Clear sinuses.  No acute orbital findings. Other: No mastoid effusions. IMPRESSION: 1. No evidence of acute intracranial abnormality. 2. Chronic left perisylvian polymicrogyria. 3. Chronic microvascular ischemic change. Electronically Signed   By: Feliberto Harts M.D.   On: 11/23/2022 12:35    Scheduled Meds:  ALPRAZolam  0.25 mg Oral BID   ARIPiprazole  2 mg Oral Daily   aspirin EC  81 mg Oral Daily   calcitRIOL  0.25 mcg Oral Daily   diltiazem  180 mg Oral Daily   DULoxetine  30 mg Oral Daily   enoxaparin (LOVENOX) injection  30 mg Subcutaneous Q24H   levETIRAcetam  500 mg Oral BID   levothyroxine  88 mcg Oral Q0600   nebivolol  2.5 mg Oral BID   mouth rinse  15 mL Mouth Rinse 4 times per day   pantoprazole  40 mg Oral BID   potassium chloride  20 mEq Oral BID   rosuvastatin  40 mg Oral Daily   Continuous Infusions:  magnesium  sulfate bolus IVPB      LOS: 1 day   Marguerita Merles, DO [facility] Available via Epic secure chat 7am-7pm After these  hours, please refer to coverage provider listed on [email or web address] 11/24/2022, 5:10 PM

## 2022-11-24 NOTE — Plan of Care (Signed)
  Problem: Clinical Measurements: Goal: Ability to maintain clinical measurements within normal limits will improve Outcome: Progressing Goal: Will remain free from infection Outcome: Progressing Goal: Diagnostic test results will improve Outcome: Progressing Goal: Respiratory complications will improve Outcome: Progressing Goal: Cardiovascular complication will be avoided Outcome: Progressing   Problem: Education: Goal: Knowledge of General Education information will improve Description: Including pain rating scale, medication(s)/side effects and non-pharmacologic comfort measures Outcome: Progressing   Problem: Health Behavior/Discharge Planning: Goal: Ability to manage health-related needs will improve Outcome: Progressing   

## 2022-11-24 NOTE — Plan of Care (Signed)
  Problem: Education: Goal: Knowledge of General Education information will improve Description: Including pain rating scale, medication(s)/side effects and non-pharmacologic comfort measures Outcome: Progressing   Problem: Nutrition: Goal: Adequate nutrition will be maintained Outcome: Progressing   Problem: Coping: Goal: Level of anxiety will decrease Outcome: Progressing   Problem: Pain Management: Goal: General experience of comfort will improve Outcome: Progressing   Problem: Skin Integrity: Goal: Risk for impaired skin integrity will decrease Outcome: Progressing

## 2022-11-24 NOTE — Hospital Course (Addendum)
The patient 77 year old female history of anxiety, CKD stage IIIa, CAD, esophageal dysmotility and stricture due to GERD presented to the ED with acute on chronic renal failure UTI and dysphagia.  She completed a 5-day course of IV ceftriaxone for her UTI and renal function started slowly improving.  Her dysphagia was worked up and the case was discussed with GI who recommended no inpatient GI consultation at this time given that she is tolerating tolerating liquids.  Subsequently she started on a dysphagia 2 diet which she is seem to be tolerating.  She continues to be extremely weak and so PT OT recommending SNF and awaiting English as a second language teacher.  Insurance authorization has been obtained and she is stable for discharge   Prior to D/C she was worked up for her right hand weakness with MRI and EEG and will need to follow up with Neurology in the outpatient setting.  Given that she is medically stable she also need to follow-up with her PCP as well as gastroenterology in outpatient setting.  Assessment and Plan:  Suspected UTI -Patient noted on urinalysis was cloudy, large hemoglobin, trace ketones, small leukocytes, nitrite negative, few bacteria, WBC >50. -Patient recently seen in the ED received a dose of IV -Rocephin for presumed UTI, urine cultures were not obtained and patient was not sent out on further antibiotics at that time. -Urine cultures obtained with no growth to date however patient did receive IV antibiotics a few days ago when she presented to the ED. -Patient was placed on IV Ceftriaxone during the hospitalization and has completed a 5-day course of treatment.   -No further antibiotics needed.    Acute Kidney Injury on CKD stage IIIa Metabolic Acidosis -Patient noted on admission with creatinine of 2.09 with baseline creatinine of approximately 1.2 -BUN/Cr Trend: Recent Labs  Lab 11/21/22 0744 11/21/22 1702 11/22/22 0516 11/23/22 0505 11/24/22 0516 11/25/22 0521  11/26/22 0512  BUN 12 14 16 19 22 19  25*  CREATININE 1.28* 1.44* 1.32* 1.26* 1.29* 1.47* 1.49*  -Metabolic acidosis has improved as she is a CO2 25, anion gap of 7, chloride level 102 -Likely secondary to prerenal azotemia in the setting of dehydration and poor oral intake, in the setting of ARB. -Improved with IVF hydration and this was resumed today prior to discharge -Avoid Nephrotoxic Medications and continue to hold ARB, Contrast Dyes, Hypotension and Dehydration to Ensure Adequate Renal Perfusion and will need to Renally Adjust Meds -Continue to Monitor and Trend Renal Function carefully and repeat CMP within 1 week -C/w Supportive care.   Dysphagia -Patient with history of esophageal dysmotility and esophageal stricture with last dilatation January 2021. -Patient noted to be followed by [facility], Dr. Marina Goodell as outpatient. -Admitting physician, Dr. Kirby Crigler discussed briefly with Dr. Marina Goodell who knows the patient well and stated his symptoms were probably due to dysmotility and no inpatient GI consultation needed at this time. -Patient currently tolerating clear liquids.   -Esophagram done 11/19/2022 noted to be abnormal with mild esophageal dysmotility, smooth mild narrowing of the distal esophageal lumen preventing the passage of a 13 mm barium tablet, suspicious for mild recurrent stricture.  No mucosal irregularity identified.   -Case discussed with patient's primary gastroenterologist Dr. Marina Goodell who reviewed esophagram and felt patient's dysphagia was chronic and recommended outpatient follow-up for reevaluation and consideration of repeat endoscopy.   -Patient started on a dysphagia 2 diet which she seems to be tolerating.   -Continue to Follow as an outpatient.   Electrolyte Abnormalities including Hyponatremia,  Hypokalemia, and Hypomagnesemia -Na+,K+, and Mag Trend: Recent Labs  Lab 11/21/22 0744 11/21/22 1702 11/22/22 0516 11/23/22 0505 11/24/22 0515 11/24/22 0516  11/25/22 0521 11/26/22 0512  NA 139 138 132* 134*  --  133* 136 134*  K 2.3* 3.9 3.6 3.6  --  3.8 4.1 4.2  MG 1.2*  --  2.4 1.8   < >  --  1.9 1.6*   < > = values in this interval not displayed.  -Replete with IV Mag Sulfate 2 grams again -Na+ a little low but recommending to continue to Follow  -Continue to Monitor and Trend and repeat CMP, Mag within 1 week    Right Upper Extremity Weakness, stable -Patient noted some right upper extremity weakness per OT yesterday which was worse on the left side and had decreased right range of motion. -Patient with no visual changes, no dysarthric speech.   -She denied any new onset weakness, numbness and tingling or changes in her vision or deficits in balance and relates her right-sided weakness to arthritis -Patient with a baseline Left arm tremor. -CT head on admission was unremarkable. -Repeat head CT done and showed "No evidence of acute intracranial abnormality. Chronic left perisylvian polymicrogyria. Chronic microvascular ischemic change." -Discussed with Neuro Dr. Wilford Corner who recommended obtaining an MRI and EEG for completion.    GERD/GI Prophylaxis -C/w PPI with Pantoprazole 40 mg po BID   Lactic Acidosis -Secondary to dehydration. -Lactic Acid Level Trend: Recent Labs  Lab 11/15/22 1107 11/15/22 1437  LATICACIDVEN 2.0* 1.8  -Improved with IV fluids.  Hypertension -BP better controlled with resumption of home regimen of Nebivolol 2.5 mg po BID and Diltiazem 180 mg po Daily.   -Continue to hold ARB due to AKI.   -C/w IV Hydralazine 5 mg q6hprn HBP >160 -Continue to Monitor BP per Protcol -Last BP reading was 135/100   Anxiety -C/w Duloxetine 30 mg po Daily and Lorazepam 500 mg po BID   Seizure History -C/w Levetiracetam 500 mg po BID -C/w Seizure Precautions -Ordering EEG given Left Arm Tremor and it showed "This study is suggestive of mild diffuse encephalopathy. The excessive beta activity seen in the background is most  likely due to the effect of benzodiazepine and is a benign EEG pattern. No seizures or epileptiform discharges were seen throughout the recording."   Hyperlipidemia -Continuing Rosuvastatin 40 mg po Daily but may need to hold given slightly abnormal AST  Abnormal AST -Mild and Likely Reactive -AST Trend: Recent Labs  Lab 11/15/22 1106 11/18/22 0926 11/24/22 0515 11/25/22 0521 11/26/22 0512  AST 27 68* 35 64* 74*  -If continues to be elevated in outpatient setting recommend holding statin and pursuing further workup with a right upper quadrant ultrasound and acute hepatitis panel   Hypothyroidism -Continue Levothyroxine 88 mcg po Daily.  Normocytic Anemia -Hgb/Hct Trend: Recent Labs  Lab 11/20/22 0824 11/21/22 0744 11/22/22 0516 11/23/22 0505 11/24/22 0516 11/25/22 0521 11/26/22 0512  HGB 13.4 12.8 12.2 11.7* 11.0* 12.2 12.0  HCT 44.9 40.1 38.0 38.3 35.8* 39.0 38.6  MCV 94.5 87.9 88.0 91.2 91.1 90.5 90.6  -Checked Anemia Panel and it showed an iron level of 51, UIBC 134, TIBC 185, saturation ratios of 28%, ferritin level 352, folate level 6.9, vitamin B12 183 -Will start vitamin B12 supplementation with 1000 mcg p.o. daily -Continue to Monitor for S/Sx of Bleeding; no overt bleeding -Repeat CBC in the AM  Hypoalbuminemia -Patient's Albumin Trend: Recent Labs  Lab 11/15/22 1106 11/18/22 0926 11/20/22 5284  11/24/22 0515 11/25/22 0521 11/26/22 0512  ALBUMIN 3.7 4.5 3.2* 2.5* 2.7* 2.7*  -Continue to Monitor and Trend and repeat CMP in the AM

## 2022-11-24 NOTE — TOC Progression Note (Signed)
Transition of Care [facility]) - Progression Note    Patient Details  Name: [name] MRN: [id number] Date of Birth: 06-14-1945  Transition of Care [facility]) CM/SW Contact  Otelia Santee, LCSW Phone Number: 11/24/2022, 12:36 PM  Clinical Narrative:    Uploaded medical records to [facility] for review. Pt's PASRR returned [id number] . Met with pt to review bed offers for SNF. Pt requested for CSW to reach out to pt's niece for review. Spoke with pt's niece to review bed offers. Niece accepted bed offer for [facility]. [facility] to begin insurance authorization.      Barriers to Discharge: SNF Pending bed offer, Awaiting State Approval (PASRR)  Expected Discharge Plan and Services                                               Social Determinants of Health (SDOH) Interventions SDOH Screenings   Food Insecurity: No Food Insecurity (11/18/2022)  Housing: Low Risk  (11/18/2022)  Transportation Needs: No Transportation Needs (11/18/2022)  Utilities: Not At Risk (11/18/2022)  Tobacco Use: Medium Risk (11/18/2022)    Readmission Risk Interventions    11/19/2022   11:49 AM  Readmission Risk Prevention Plan  Transportation Screening Complete  PCP or Specialist Appt within 5-7 Days Complete  Home Care Screening Complete  Medication Review (RN CM) Complete

## 2022-11-24 NOTE — Progress Notes (Addendum)
Mobility Specialist - Progress Note   11/24/22 1356  Mobility  Activity Ambulated with assistance in hallway  Level of Assistance Minimal assist, patient does 75% or more  Assistive Device Front wheel walker  Distance Ambulated (ft) 50 ft  Range of Motion/Exercises Active  Activity Response Tolerated well  Mobility Referral Yes  $Mobility charge 1 Mobility  Mobility Specialist Start Time (ACUTE ONLY) 1348  Mobility Specialist Stop Time (ACUTE ONLY) 1355  Mobility Specialist Time Calculation (min) (ACUTE ONLY) 7 min   Received in chair and agreed to mobility. Short distance, no c/o pain nor discomfort during session. Pt had 2 bouts of unsteadiness. Corrected with Min A and returned to bed with all needs met, staff in room.  Marilynne Halsted Mobility Specialist

## 2022-11-25 ENCOUNTER — Inpatient Hospital Stay (HOSPITAL_COMMUNITY): Payer: No Typology Code available for payment source

## 2022-11-25 DIAGNOSIS — Z8669 Personal history of other diseases of the nervous system and sense organs: Secondary | ICD-10-CM | POA: Diagnosis not present

## 2022-11-25 DIAGNOSIS — I10 Essential (primary) hypertension: Secondary | ICD-10-CM | POA: Diagnosis not present

## 2022-11-25 DIAGNOSIS — N3000 Acute cystitis without hematuria: Secondary | ICD-10-CM | POA: Diagnosis not present

## 2022-11-25 DIAGNOSIS — R569 Unspecified convulsions: Secondary | ICD-10-CM | POA: Diagnosis not present

## 2022-11-25 LAB — CBC WITH DIFFERENTIAL/PLATELET
Abs Immature Granulocytes: 0.01 10*3/uL (ref 0.00–0.07)
Basophils Absolute: 0.1 10*3/uL (ref 0.0–0.1)
Basophils Relative: 2 %
Eosinophils Absolute: 0.2 10*3/uL (ref 0.0–0.5)
Eosinophils Relative: 4 %
HCT: 39 % (ref 36.0–46.0)
Hemoglobin: 12.2 g/dL (ref 12.0–15.0)
Immature Granulocytes: 0 %
Lymphocytes Relative: 41 %
Lymphs Abs: 2.2 10*3/uL (ref 0.7–4.0)
MCH: 28.3 pg (ref 26.0–34.0)
MCHC: 31.3 g/dL (ref 30.0–36.0)
MCV: 90.5 fL (ref 80.0–100.0)
Monocytes Absolute: 0.6 10*3/uL (ref 0.1–1.0)
Monocytes Relative: 12 %
Neutro Abs: 2.2 10*3/uL (ref 1.7–7.7)
Neutrophils Relative %: 41 %
Platelets: 283 10*3/uL (ref 150–400)
RBC: 4.31 MIL/uL (ref 3.87–5.11)
RDW: 14.1 % (ref 11.5–15.5)
WBC: 5.4 10*3/uL (ref 4.0–10.5)
nRBC: 0 % (ref 0.0–0.2)

## 2022-11-25 LAB — IRON AND TIBC
Iron: 51 ug/dL (ref 28–170)
Saturation Ratios: 28 % (ref 10.4–31.8)
TIBC: 185 ug/dL — ABNORMAL LOW (ref 250–450)
UIBC: 134 ug/dL

## 2022-11-25 LAB — PHOSPHORUS: Phosphorus: 3.9 mg/dL (ref 2.5–4.6)

## 2022-11-25 LAB — COMPREHENSIVE METABOLIC PANEL
ALT: 36 U/L (ref 0–44)
AST: 64 U/L — ABNORMAL HIGH (ref 15–41)
Albumin: 2.7 g/dL — ABNORMAL LOW (ref 3.5–5.0)
Alkaline Phosphatase: 75 U/L (ref 38–126)
Anion gap: 8 (ref 5–15)
BUN: 19 mg/dL (ref 8–23)
CO2: 24 mmol/L (ref 22–32)
Calcium: 8.5 mg/dL — ABNORMAL LOW (ref 8.9–10.3)
Chloride: 104 mmol/L (ref 98–111)
Creatinine, Ser: 1.47 mg/dL — ABNORMAL HIGH (ref 0.44–1.00)
GFR, Estimated: 37 mL/min — ABNORMAL LOW (ref >60)
Glucose, Bld: 102 mg/dL — ABNORMAL HIGH (ref 70–99)
Potassium: 4.1 mmol/L (ref 3.5–5.1)
Sodium: 136 mmol/L (ref 135–145)
Total Bilirubin: 0.4 mg/dL (ref <1.2)
Total Protein: 6 g/dL — ABNORMAL LOW (ref 6.5–8.1)

## 2022-11-25 LAB — MAGNESIUM: Magnesium: 1.9 mg/dL (ref 1.7–2.4)

## 2022-11-25 LAB — RETICULOCYTES
Immature Retic Fract: 6.6 % (ref 2.3–15.9)
RBC.: 4.3 MIL/uL (ref 3.87–5.11)
Retic Count, Absolute: 39.1 10*3/uL (ref 19.0–186.0)
Retic Ct Pct: 0.9 % (ref 0.4–3.1)

## 2022-11-25 LAB — VITAMIN B12: Vitamin B-12: 183 pg/mL (ref 180–914)

## 2022-11-25 LAB — FERRITIN: Ferritin: 352 ng/mL — ABNORMAL HIGH (ref 11–307)

## 2022-11-25 LAB — FOLATE: Folate: 6.9 ng/mL (ref >5.9)

## 2022-11-25 NOTE — TOC Progression Note (Signed)
Transition of Care [facility]) - Progression Note    Patient Details  Name: [name] MRN: [id number] Date of Birth: 1945/04/27  Transition of Care [facility]) CM/SW Contact  Otelia Santee, LCSW Phone Number: 11/25/2022, 1:16 PM  Clinical Narrative:    Still awaiting insurance approval for SNF.     Barriers to Discharge: SNF Pending bed offer, Awaiting State Approval (PASRR)  Expected Discharge Plan and Services                                               Social Determinants of Health (SDOH) Interventions SDOH Screenings   Food Insecurity: No Food Insecurity (11/18/2022)  Housing: Low Risk  (11/18/2022)  Transportation Needs: No Transportation Needs (11/18/2022)  Utilities: Not At Risk (11/18/2022)  Tobacco Use: Medium Risk (11/18/2022)    Readmission Risk Interventions    11/19/2022   11:49 AM  Readmission Risk Prevention Plan  Transportation Screening Complete  PCP or Specialist Appt within 5-7 Days Complete  Home Care Screening Complete  Medication Review (RN CM) Complete

## 2022-11-25 NOTE — Progress Notes (Signed)
Occupational Therapy Treatment Patient Details Name: [name] MRN: [id number] DOB: [birth year] Today's Date: 11/25/2022   History of present illness [name] is a 77 y.o. female admitted with acute on chronic renal failure, UTI, and dysphagia. PMH: anxiety, CKD stage III, CAD, esophageal dysmotility and stricture due to GERD   OT comments  Patient progressing and showed improved tolerance to standing for grooming and able to don underwear over feet with Min Assist, compared to previous session. Pt does however still require Mod Assist to stand and pull underwear over hips and requires step by step cues and CGA at sink for sequencing grooming.   Patient remains limited by tremor, mild cognitive deficits, generalized weakness and decreased activity tolerance along with deficits noted below. Pt continues to demonstrate good rehab potential and would benefit from continued skilled OT to increase safety and independence with ADLs and functional transfers to allow pt to return home safely and reduce caregiver burden and fall risk.       If plan is discharge home, recommend the following:  A lot of help with bathing/dressing/bathroom;Assistance with cooking/housework;Direct supervision/assist for medications management;Assist for transportation;Help with stairs or ramp for entrance;Direct supervision/assist for financial management;A lot of help with walking and/or transfers   Equipment Recommendations  None recommended by OT    Recommendations for Other Services      Precautions / Restrictions Precautions Precautions: Fall Precaution Comments: HOH Restrictions Weight Bearing Restrictions: No       Mobility Bed Mobility Overal bed mobility: Needs Assistance Bed Mobility: Supine to Sit     Supine to sit: Contact guard, HOB elevated, Used rails          Transfers                         Balance Overall balance assessment: Needs assistance Sitting-balance  support: Feet supported, No upper extremity supported Sitting balance-Leahy Scale: Good   Postural control: Posterior lean Standing balance support: Bilateral upper extremity supported, Reliant on assistive device for balance, During functional activity Standing balance-Leahy Scale: Poor                             ADL either performed or assessed with clinical judgement   ADL Overall ADL's : Needs assistance/impaired     Grooming: Standing;Contact guard assist;Cueing for sequencing;Wash/dry hands;Wash/dry face;Oral care;Brushing hair Grooming Details (indicate cue type and reason): Pt encouraged to perform oral/face/hand wash and hair comb daily. Pt agreeable to try grooming at sink and cued for safe positioning of RW anterior to sink. Pt asked, "what should I do?" OT cues that most people like to brush their teeth then wash their face but everyone is different. Pt initated face washing. When completed pt asked, "Now what?" Required another cue to initiate oral hygiene.  Noted difficulty controlling her secretions and was assisted with paper towel to keep mouth and chin dry. Pt had energy available to continue standing to comb hair,             Lower Body Dressing: Minimal assistance;Moderate assistance;Cueing for safety Lower Body Dressing Details (indicate cue type and reason): Pt sat EOB and donned mesh underwsear over feet with Min As. Pt stood and required Moderate to assist to pull over hips. Toilet Transfer: Ambulation;Cueing for safety;Cueing for sequencing;Contact guard assist;Minimal assistance Toilet Transfer Details (indicate cue type and reason): Pt stood from EOB to RW with cues for  hands and CGA-Min As. Pt ambulated ~5' to sink for grooming and then backed up ~3 steps to recliner. CGA for safety with descent to chair   Toileting - Clothing Manipulation Details (indicate cue type and reason): Pt with pure wick. Pt reports dififculty controlling her baldder since  hospitalization. Pt denied need for bowel movement.     Functional mobility during ADLs: Rolling walker (2 wheels);Minimal assistance;Contact guard assist;Cueing for sequencing;Cueing for safety      Extremity/Trunk Assessment Upper Extremity Assessment Upper Extremity Assessment: LUE deficits/detail RUE Deficits / Details: on OT eval, shoulder able to range to 90 degrees. 0-45 degrees ROM present this date with audible creptius. Pt with significantly poor RUE coordination. LUE Deficits / Details: Appears limited at shoulder. Pt dipping her head down to hand to comb hair. With cues pt corrrected to a little more of an upright posture but still not raising arm at shoulder much. Pt confirmed "probably" arthritic.            Vision   Additional Comments: Pt required grooming items on vanility to be pointed out then pt able to reach for them.   Perception     Praxis      Cognition Arousal: Alert Behavior During Therapy: Flat affect, WFL for tasks assessed/performed Overall Cognitive Status: Impaired/Different from baseline Area of Impairment: Following commands, Memory, Awareness, Problem solving                       Following Commands: Follows one step commands with increased time   Awareness: Emergent Problem Solving: Slow processing, Decreased initiation, Difficulty sequencing, Requires verbal cues General Comments: HOH, tremor present        Exercises      Shoulder Instructions       General Comments      Pertinent Vitals/ Pain       Pain Assessment Pain Assessment: No/denies pain  Home Living                                          Prior Functioning/Environment              Frequency  Min 1X/week        Progress Toward Goals  OT Goals(current goals can now be found in the care plan section)  Progress towards OT goals: Progressing toward goals  Acute Rehab OT Goals Patient Stated Goal: Go home OT Goal Formulation:  With patient Time For Goal Achievement: 12/04/22 Potential to Achieve Goals: Fair  Plan      Co-evaluation                 AM-PAC OT "6 Clicks" Daily Activity     Outcome Measure   Help from another person eating meals?: A Little Help from another person taking care of personal grooming?: A Little Help from another person toileting, which includes using toliet, bedpan, or urinal?: A Lot Help from another person bathing (including washing, rinsing, drying)?: A Lot Help from another person to put on and taking off regular upper body clothing?: A Little Help from another person to put on and taking off regular lower body clothing?: A Lot 6 Click Score: 15    End of Session Equipment Utilized During Treatment: Gait belt;Rolling walker (2 wheels)  OT Visit Diagnosis: Unsteadiness on feet (R26.81);Other abnormalities of gait and mobility (R26.89);Muscle weakness (generalized) (M62.81)  Activity Tolerance Patient tolerated treatment well   Patient Left in chair;with call bell/phone within reach;with chair alarm set   Nurse Communication          Time: [phone] OT Time Calculation (min): 30 min  Charges: OT General Charges $OT Visit: 1 Visit OT Treatments $Self Care/Home Management : 8-22 mins $Therapeutic Activity: 8-22 mins  Victorino Dike, OT Acute Rehab Services Office: [phone] 11/25/2022  [name] 11/25/2022, 3:07 PM

## 2022-11-25 NOTE — Plan of Care (Signed)
On-call neurology note Called by the hospitalist Dr. Katina Dung admitted with suspected UTI and dysphagia being treated by medicine.  Concern for right-sided weakness and tremulousness that is been going on for a few days.  Has a history of focal seizures.  Also on Abilify.  CT head with unchanged left hemispheric polymicrogyria in the insular region.  Sees [place] [facility].  Has a chronic tremor in her jaw.  I recommended getting an MRI and EEG to rule out a stroke and seizure and if both those studies are negative, no further workup but if either of them are abnormal, please call back for formal consultation.  -- Milon Dikes, MD Neurologist [facility]

## 2022-11-25 NOTE — Progress Notes (Addendum)
PROGRESS NOTE    [name]  NFA:[id number] DOB: 1945/08/24 DOA: 11/18/2022 PCP: Merri Brunette, MD   Brief Narrative:  The patient 77 year old female history of anxiety, CKD stage IIIa, CAD, esophageal dysmotility and stricture due to GERD presented to the ED with acute on chronic renal failure UTI and dysphagia.  She completed a 5-day course of IV ceftriaxone for her UTI and renal function started slowly improving.  Her dysphagia was worked up and the case was discussed with GI who recommended no inpatient GI consultation at this time given that she is tolerating tolerating liquids.  Subsequently she started on a dysphagia 2 diet which she is seem to be tolerating.  She continues to be extremely weak and so PT OT recommending SNF and awaiting English as a second language teacher.  Assessment and Plan:  Suspected UTI -Patient noted on urinalysis was cloudy, large hemoglobin, trace ketones, small leukocytes, nitrite negative, few bacteria, WBC >50. -Patient recently seen in the ED received a dose of IV -Rocephin for presumed UTI, urine cultures were not obtained and patient was not sent out on further antibiotics at that time. -Urine cultures obtained with no growth to date however patient did receive IV antibiotics a few days ago when she presented to the ED. -Patient was placed on IV Ceftriaxone during the hospitalization and has completed a 5-day course of treatment.   -No further antibiotics needed.    Acute Kidney Injury on CKD stage IIIa Metabolic Acidosis -Patient noted on admission with creatinine of 2.09 with baseline creatinine of approximately 1.2 -BUN/Cr Trend: Recent Labs  Lab 11/20/22 0824 11/21/22 0744 11/21/22 1702 11/22/22 0516 11/23/22 0505 11/24/22 0516 11/25/22 0521  BUN 13 12 14 16 19 22 19   CREATININE 1.31* 1.28* 1.44* 1.32* 1.26* 1.29* 1.47*  -Patient's Metabolic Acidosis with a CO2 of 24, anion gap of 8, chloride of 104 -Likely secondary to prerenal azotemia in the  setting of dehydration and poor oral intake, in the setting of ARB. -Improved with IVF hydration  -Avoid Nephrotoxic Medications and continue to hold ARB, Contrast Dyes, Hypotension and Dehydration to Ensure Adequate Renal Perfusion and will need to Renally Adjust Meds -Continue to Monitor and Trend Renal Function carefully and repeat CMP in the AM  -C/w Supportive care.   Dysphagia -Patient with history of esophageal dysmotility and esophageal stricture with last dilatation January 2021. -Patient noted to be followed by [facility], Dr. Marina Goodell as outpatient. -Admitting physician, Dr. Kirby Crigler discussed briefly with Dr. Marina Goodell who knows the patient well and stated his symptoms were probably due to dysmotility and no inpatient GI consultation needed at this time. -Patient currently tolerating clear liquids.   -Esophagram done 11/19/2022 noted to be abnormal with mild esophageal dysmotility, smooth mild narrowing of the distal esophageal lumen preventing the passage of a 13 mm barium tablet, suspicious for mild recurrent stricture.  No mucosal irregularity identified.   -Case discussed with patient's primary gastroenterologist Dr. Marina Goodell who reviewed esophagram and felt patient's dysphagia was chronic and recommended outpatient follow-up for reevaluation and consideration of repeat endoscopy.   -Patient started on a dysphagia 2 diet which she seems to be tolerating.   -Continue to Follow.   Electrolyte Abnormalities including Hyponatremia, Hypokalemia, and Hypomagnesemia -Na+,K+, and Mag Trend: Recent Labs  Lab 11/20/22 0824 11/21/22 0744 11/21/22 1702 11/22/22 0516 11/23/22 0505 11/24/22 0515 11/24/22 0516 11/25/22 0521  NA 142 139 138 132* 134*  --  133* 136  K 3.1* 2.3* 3.9 3.6 3.6  --  3.8 4.1  MG 2.2 1.2*  --  2.4 1.8   < >  --  1.9   < > = values in this interval not displayed.  -Replete with IV Mag Sulfate 2 grams yesterday -Continue to Monitor and Trend and repeat CMP, Mag in the  AM   Right Upper Extremity Weakness, stable -Patient noted some right upper extremity weakness per OT yesterday which was worse on the left side and had decreased right range of motion. -Patient with no visual changes, no dysarthric speech.   -She denied any new onset weakness, numbness and tingling or changes in her vision or deficits in balance and relates her right-sided weakness to arthritis -Patient with a baseline Left arm tremor. -CT head on admission was unremarkable. -Repeat head CT done and showed "No evidence of acute intracranial abnormality. Chronic left perisylvian polymicrogyria. Chronic microvascular ischemic change." -Discussed with Neuro Dr. Wilford Corner who recommended obtaining an MRI and EEG for completion.    GERD/GI Prophylaxis -C/w PPI with Pantoprazole 40 mg po BID   Lactic Acidosis -Secondary to dehydration. -Lactic Acid Level Trend: Recent Labs  Lab 11/15/22 1107 11/15/22 1437  LATICACIDVEN 2.0* 1.8  -Improved with IV fluids.  Hypertension -BP better controlled with resumption of home regimen of Nebivolol 2.5 mg po BID and Diltiazem 180 mg po Daily.   -Continue to hold ARB due to AKI.   -C/w IV Hydralazine 5 mg q6hprn HBP >160 -Continue to Monitor BP per Protcol -Last BP reading was 145/70   Anxiety -C/w Duloxetine 30 mg po Daily and Lorazepam 500 mg po BID   Seizure History -C/w Levetiracetam 500 mg po BID -C/w Seizure Precautions -Ordering EEG given Left Arm Tremor   Hyperlipidemia -Continue Rosuvastatin 40 mg po Daily.     Hypothyroidism -Continue Levothyroxine 88 mcg po Daily.  Normocytic Anemia -Hgb/Hct Trend: Recent Labs  Lab 11/19/22 0555 11/20/22 0824 11/21/22 0744 11/22/22 0516 11/23/22 0505 11/24/22 0516 11/25/22 0521  HGB 11.0* 13.4 12.8 12.2 11.7* 11.0* 12.2  HCT 35.0* 44.9 40.1 38.0 38.3 35.8* 39.0  MCV 90.7 94.5 87.9 88.0 91.2 91.1 90.5  -Check Anemia Panel in the AM -Continue to Monitor for S/Sx of Bleeding; no overt  bleeding -Repeat CBC in the AM  Hypoalbuminemia -Patient's Albumin Trend: Recent Labs  Lab 11/15/22 1106 11/18/22 0926 11/20/22 0824 11/24/22 0515 11/25/22 0521  ALBUMIN 3.7 4.5 3.2* 2.5* 2.7*  -Continue to Monitor and Trend and repeat CMP in the AM   DVT prophylaxis: Place TED hose Start: 11/21/22 1846 enoxaparin (LOVENOX) injection 30 mg Start: 11/18/22 1600 SCDs Start: 11/18/22 1333    Code Status: Limited: Do not attempt resuscitation (DNR) -DNR-LIMITED -Do Not Intubate/DNI  Family Communication: No family present at bedside  Disposition Plan:  Level of care: Med-Surg Status is: Inpatient Remains inpatient appropriate because: Needs SNF and awaiting for Insurance Authorization    Consultants:  None  Procedures:  As delineated as above  Antimicrobials:  Anti-infectives (From admission, onward)    Start     Dose/Rate Route Frequency Ordered Stop   11/19/22 1100  cefTRIAXone (ROCEPHIN) 1 g in sodium chloride 0.9 % 100 mL IVPB  Status:  Discontinued        1 g 200 mL/hr over 30 Minutes Intravenous Every 24 hours 11/18/22 1333 11/19/22 0905   11/19/22 1100  cefTRIAXone (ROCEPHIN) 1 g in sodium chloride 0.9 % 100 mL IVPB        1 g 200 mL/hr over 30 Minutes Intravenous Every 24 hours 11/19/22 0905  11/22/22 2150   11/18/22 1115  cefTRIAXone (ROCEPHIN) 1 g in sodium chloride 0.9 % 100 mL IVPB        1 g 200 mL/hr over 30 Minutes Intravenous  Once 11/18/22 1114 11/18/22 1157       Subjective: Seen and examined at bedside and she is sitting in the chair in no acute distress.  Feels okay.  No nausea or vomiting.  No other concerns or complaints this time.  Objective: Vitals:   11/24/22 1924 11/25/22 0452 11/25/22 1308 11/25/22 2001  BP: 121/72 (!) 154/64 (!) 145/70 134/72  Pulse: 62 65 62 64  Resp: 13 12 16 12   Temp: (!) 97.4 F (36.3 C) 98 F (36.7 C) 97.9 F (36.6 C) 98 F (36.7 C)  TempSrc:  Axillary  Oral  SpO2: 98% 97% 99% 98%  Weight:      Height:         Intake/Output Summary (Last 24 hours) at 11/25/2022 2040 Last data filed at 11/25/2022 1050 Gross per 24 hour  Intake --  Output 1500 ml  Net -1500 ml   Filed Weights   11/18/22 0816 11/18/22 0831  Weight: 44.9 kg 44.9 kg   Examination: Physical Exam:  Constitutional: Elderly chronically ill-appearing Caucasian female no acute distress standing at her bedside Respiratory: Diminished to auscultation bilaterally, no wheezing, rales, rhonchi or crackles. Normal respiratory effort and patient is not tachypenic. No accessory muscle use.  Unlabored breathing Cardiovascular: RRR, no murmurs / rubs / gallops. S1 and S2 auscultated. No extremity edema.  Abdomen: Soft, non-tender, non-distended. Bowel sounds positive.  GU: Deferred. Musculoskeletal: No clubbing / cyanosis of digits/nails. No joint deformity upper and lower extremities limited skin evaluation Skin: No rashes, lesions, ulcers. No induration; Warm and dry.  Neurologic: CN 2-12 grossly intact with no focal deficits.  Has a tremor noted on her left arm and right side of her face.  Romberg sign cerebellar reflexes not assessed.  Psychiatric: Normal judgment and insight. Alert and oriented x 3. Normal mood and appropriate affect.   Data Reviewed: I have personally reviewed following labs and imaging studies  CBC: Recent Labs  Lab 11/21/22 0744 11/22/22 0516 11/23/22 0505 11/24/22 0516 11/25/22 0521  WBC 9.9 13.4* 9.5 6.9 5.4  NEUTROABS  --   --  5.6  --  2.2  HGB 12.8 12.2 11.7* 11.0* 12.2  HCT 40.1 38.0 38.3 35.8* 39.0  MCV 87.9 88.0 91.2 91.1 90.5  PLT 207 253 213 231 283   Basic Metabolic Panel: Recent Labs  Lab 11/20/22 0824 11/21/22 0744 11/21/22 1702 11/22/22 0516 11/23/22 0505 11/24/22 0515 11/24/22 0516 11/25/22 0521  NA 142 139 138 132* 134*  --  133* 136  K 3.1* 2.3* 3.9 3.6 3.6  --  3.8 4.1  CL 111 106 108 102 105  --  104 104  CO2 20* 22 21* 22 20*  --  21* 24  GLUCOSE 91 116* 111* 107* 91  --   94 102*  BUN 13 12 14 16 19   --  22 19  CREATININE 1.31* 1.28* 1.44* 1.32* 1.26*  --  1.29* 1.47*  CALCIUM 7.4* 7.3* 7.2* 7.2* 7.6*  --  7.9* 8.5*  MG 2.2 1.2*  --  2.4 1.8 1.6*  --  1.9  PHOS 2.8  --   --   --   --  3.0  --  3.9   GFR: Estimated Creatinine Clearance: 22.2 mL/min (A) (by C-G formula based on SCr of 1.47  mg/dL (H)). Liver Function Tests: Recent Labs  Lab 11/20/22 0824 11/24/22 0515 11/25/22 0521  AST  --  35 64*  ALT  --  23 36  ALKPHOS  --  65 75  BILITOT  --  0.5 0.4  PROT  --  5.5* 6.0*  ALBUMIN 3.2* 2.5* 2.7*   No results for input(s): "LIPASE", "AMYLASE" in the last 168 hours. No results for input(s): "AMMONIA" in the last 168 hours. Coagulation Profile: No results for input(s): "INR", "PROTIME" in the last 168 hours. Cardiac Enzymes: No results for input(s): "CKTOTAL", "CKMB", "CKMBINDEX", "TROPONINI" in the last 168 hours. BNP (last 3 results) No results for input(s): "PROBNP" in the last 8760 hours. HbA1C: No results for input(s): "HGBA1C" in the last 72 hours. CBG: No results for input(s): "GLUCAP" in the last 168 hours. Lipid Profile: No results for input(s): "CHOL", "HDL", "LDLCALC", "TRIG", "CHOLHDL", "LDLDIRECT" in the last 72 hours. Thyroid Function Tests: No results for input(s): "TSH", "T4TOTAL", "FREET4", "T3FREE", "THYROIDAB" in the last 72 hours. Anemia Panel: Recent Labs    11/25/22 0521  VITAMINB12 183  FOLATE 6.9  FERRITIN 352*  TIBC 185*  IRON 51  RETICCTPCT 0.9   Sepsis Labs: No results for input(s): "PROCALCITON", "LATICACIDVEN" in the last 168 hours.  Recent Results (from the past 240 hour(s))  Urine Culture     Status: None   Collection Time: 11/18/22  8:52 AM   Specimen: Urine, Clean Catch  Result Value Ref Range Status   Specimen Description   Final    URINE, CLEAN CATCH Performed at [facility], [address], [place], [place] [address]    Special Requests   Final    NONE Performed at  [facility], [address] [place], [place] [address]    Culture   Final    NO GROWTH Performed at [facility], [address], [place], [place] [address]    Report Status 11/19/2022 FINAL  Final    Radiology Studies: No results found.  Scheduled Meds:  ALPRAZolam  0.25 mg Oral BID   ARIPiprazole  2 mg Oral Daily   aspirin EC  81 mg Oral Daily   calcitRIOL  0.25 mcg Oral Daily   diltiazem  180 mg Oral Daily   DULoxetine  30 mg Oral Daily   enoxaparin (LOVENOX) injection  30 mg Subcutaneous Q24H   levETIRAcetam  500 mg Oral BID   levothyroxine  88 mcg Oral Q0600   nebivolol  2.5 mg Oral BID   mouth rinse  15 mL Mouth Rinse 4 times per day   pantoprazole  40 mg Oral BID   potassium chloride  20 mEq Oral BID   rosuvastatin  40 mg Oral Daily   Continuous Infusions:   LOS: 2 days   Marguerita Merles, DO [facility] Available via Epic secure chat 7am-7pm After these hours, please refer to coverage provider listed on [email or web address] 11/25/2022, 8:40 PM

## 2022-11-25 NOTE — Progress Notes (Signed)
Physical Therapy Treatment Patient Details Name: [name] MRN: [id number] DOB: 1945-06-10 Today's Date: 11/25/2022   History of Present Illness [name] is a 77 y.o. female admitted with acute on chronic renal failure, UTI, and dysphagia. PMH: anxiety, CKD stage III, CAD, esophageal dysmotility and stricture due to GERD    PT Comments  Pt is progressing with mobility, she tolerated increased ambulation distance of 30' with RW and min A for balance.     If plan is discharge home, recommend the following: A little help with walking and/or transfers;A little help with bathing/dressing/bathroom;Assistance with cooking/housework;Help with stairs or ramp for entrance;Assist for transportation   Can travel by private vehicle     No  Equipment Recommendations  Rolling walker (2 wheels)    Recommendations for Other Services       Precautions / Restrictions Precautions Precautions: Fall Precaution Comments: HOH Restrictions Weight Bearing Restrictions: No     Mobility  Bed Mobility   Bed Mobility: Supine to Sit     Supine to sit: Min assist     General bed mobility comments: increased time, movements effortful. cues provided, min A to pivot hips to EOB    Transfers Overall transfer level: Needs assistance Equipment used: Rolling walker (2 wheels) Transfers: Sit to/from Stand Sit to Stand: Min assist           General transfer comment: assist to rise, steady, control descent. pt attempted to sit before properly positioned in front of recliner. cues for safety, hand placement. increased time.    Ambulation/Gait Ambulation/Gait assistance: Min assist Gait Distance (Feet): 70 Feet Assistive device: Rolling walker (2 wheels) Gait Pattern/deviations: Decreased stride length, Decreased step length - right, Decreased step length - left, Trunk flexed Gait velocity: decreased     General Gait Details: assist to stabilize during turns   Acupuncturist     Tilt Bed    Modified Rankin (Stroke Patients Only)       Balance Overall balance assessment: Needs assistance Sitting-balance support: Feet supported, No upper extremity supported Sitting balance-Leahy Scale: Good     Standing balance support: Bilateral upper extremity supported, Reliant on assistive device for balance, During functional activity Standing balance-Leahy Scale: Poor                              Cognition Arousal: Alert Behavior During Therapy: Flat affect Overall Cognitive Status: Within Functional Limits for tasks assessed                                 General Comments: HOH, tremor present        Exercises      General Comments        Pertinent Vitals/Pain Pain Assessment Pain Assessment: No/denies pain    Home Living                          Prior Function            PT Goals (current goals can now be found in the care plan section) Acute Rehab PT Goals Patient Stated Goal: home with dog [name] PT Goal Formulation: With patient Time For Goal Achievement: 12/03/22 Potential to Achieve Goals: Good Progress towards PT goals: Progressing toward goals  Frequency    Min 1X/week      PT Plan      Co-evaluation              AM-PAC PT "6 Clicks" Mobility   Outcome Measure  Help needed turning from your back to your side while in a flat bed without using bedrails?: None Help needed moving from lying on your back to sitting on the side of a flat bed without using bedrails?: A Little Help needed moving to and from a bed to a chair (including a wheelchair)?: A Little Help needed standing up from a chair using your arms (e.g., wheelchair or bedside chair)?: A Little Help needed to walk in hospital room?: A Little Help needed climbing 3-5 steps with a railing? : A Lot 6 Click Score: 18    End of Session Equipment Utilized During Treatment: Gait belt Activity  Tolerance: Patient tolerated treatment well Patient left: in chair;with call bell/phone within reach;with chair alarm set Nurse Communication: Mobility status PT Visit Diagnosis: Unsteadiness on feet (R26.81);Muscle weakness (generalized) (M62.81);Other abnormalities of gait and mobility (R26.89)     Time: 0865-7846 PT Time Calculation (min) (ACUTE ONLY): 12 min  Charges:    $Gait Training: 8-22 mins PT General Charges $$ ACUTE PT VISIT: 1 Visit                    Tamala Ser PT 11/25/2022  Acute Rehabilitation Services  Office [phone]

## 2022-11-26 ENCOUNTER — Inpatient Hospital Stay (HOSPITAL_COMMUNITY)
Admit: 2022-11-26 | Discharge: 2022-11-26 | Disposition: A | Discharge status: Home / Self Care | Payer: No Typology Code available for payment source | Attending: Internal Medicine

## 2022-11-26 DIAGNOSIS — R569 Unspecified convulsions: Secondary | ICD-10-CM | POA: Diagnosis not present

## 2022-11-26 DIAGNOSIS — Z8669 Personal history of other diseases of the nervous system and sense organs: Secondary | ICD-10-CM | POA: Diagnosis not present

## 2022-11-26 DIAGNOSIS — I10 Essential (primary) hypertension: Secondary | ICD-10-CM | POA: Diagnosis not present

## 2022-11-26 DIAGNOSIS — N3000 Acute cystitis without hematuria: Secondary | ICD-10-CM | POA: Diagnosis not present

## 2022-11-26 DIAGNOSIS — R4182 Altered mental status, unspecified: Secondary | ICD-10-CM

## 2022-11-26 LAB — COMPREHENSIVE METABOLIC PANEL
ALT: 44 U/L (ref 0–44)
AST: 74 U/L — ABNORMAL HIGH (ref 15–41)
Albumin: 2.7 g/dL — ABNORMAL LOW (ref 3.5–5.0)
Alkaline Phosphatase: 81 U/L (ref 38–126)
Anion gap: 7 (ref 5–15)
BUN: 25 mg/dL — ABNORMAL HIGH (ref 8–23)
CO2: 25 mmol/L (ref 22–32)
Calcium: 8.9 mg/dL (ref 8.9–10.3)
Chloride: 102 mmol/L (ref 98–111)
Creatinine, Ser: 1.49 mg/dL — ABNORMAL HIGH (ref 0.44–1.00)
GFR, Estimated: 36 mL/min — ABNORMAL LOW (ref >60)
Glucose, Bld: 105 mg/dL — ABNORMAL HIGH (ref 70–99)
Potassium: 4.2 mmol/L (ref 3.5–5.1)
Sodium: 134 mmol/L — ABNORMAL LOW (ref 135–145)
Total Bilirubin: 0.5 mg/dL (ref <1.2)
Total Protein: 6.1 g/dL — ABNORMAL LOW (ref 6.5–8.1)

## 2022-11-26 LAB — CBC WITH DIFFERENTIAL/PLATELET
Abs Immature Granulocytes: 0.02 10*3/uL (ref 0.00–0.07)
Basophils Absolute: 0.1 10*3/uL (ref 0.0–0.1)
Basophils Relative: 1 %
Eosinophils Absolute: 0.2 10*3/uL (ref 0.0–0.5)
Eosinophils Relative: 3 %
HCT: 38.6 % (ref 36.0–46.0)
Hemoglobin: 12 g/dL (ref 12.0–15.0)
Immature Granulocytes: 0 %
Lymphocytes Relative: 43 %
Lymphs Abs: 2.9 10*3/uL (ref 0.7–4.0)
MCH: 28.2 pg (ref 26.0–34.0)
MCHC: 31.1 g/dL (ref 30.0–36.0)
MCV: 90.6 fL (ref 80.0–100.0)
Monocytes Absolute: 0.9 10*3/uL (ref 0.1–1.0)
Monocytes Relative: 13 %
Neutro Abs: 2.8 10*3/uL (ref 1.7–7.7)
Neutrophils Relative %: 40 %
Platelets: 334 10*3/uL (ref 150–400)
RBC: 4.26 MIL/uL (ref 3.87–5.11)
RDW: 14.1 % (ref 11.5–15.5)
WBC: 6.9 10*3/uL (ref 4.0–10.5)
nRBC: 0 % (ref 0.0–0.2)

## 2022-11-26 LAB — MAGNESIUM: Magnesium: 1.6 mg/dL — ABNORMAL LOW (ref 1.7–2.4)

## 2022-11-26 LAB — PHOSPHORUS: Phosphorus: 4.1 mg/dL (ref 2.5–4.6)

## 2022-11-26 MED ORDER — VITAMIN B-12 1000 MCG PO TABS
1000.0000 ug | ORAL_TABLET | Freq: Every day | ORAL | Status: DC
  Administered 2022-11-26 – 2022-11-27 (×2): 1000 ug via ORAL
  Filled 2022-11-26 (×2): qty 1

## 2022-11-26 MED ORDER — HYDROCODONE-ACETAMINOPHEN 10-325 MG PO TABS: 1.0000 | ORAL_TABLET | Freq: Four times a day (QID) | ORAL | 0 refills | Status: DC | PRN

## 2022-11-26 MED ORDER — LOSARTAN POTASSIUM 25 MG PO TABS: 50.0000 mg | ORAL_TABLET | Freq: Every day | ORAL | Status: DC

## 2022-11-26 MED ORDER — ACETAMINOPHEN 325 MG PO TABS: 650.0000 mg | ORAL_TABLET | Freq: Four times a day (QID) | ORAL | Status: DC | PRN

## 2022-11-26 MED ORDER — ALPRAZOLAM 0.25 MG PO TABS: 0.2500 mg | ORAL_TABLET | Freq: Two times a day (BID) | ORAL | 0 refills | Status: DC

## 2022-11-26 MED ORDER — CYANOCOBALAMIN 1000 MCG PO TABS: 1000.0000 ug | ORAL_TABLET | Freq: Every day | ORAL | 0 refills | Status: DC

## 2022-11-26 MED ORDER — ALBUTEROL SULFATE (2.5 MG/3ML) 0.083% IN NEBU: 2.5000 mg | INHALATION_SOLUTION | RESPIRATORY_TRACT | 12 refills | Status: DC | PRN

## 2022-11-26 MED ORDER — MAGNESIUM SULFATE 2 GM/50ML IV SOLN
2.0000 g | Freq: Once | INTRAVENOUS | Status: AC
  Administered 2022-11-26: 2 g via INTRAVENOUS
  Filled 2022-11-26: qty 50

## 2022-11-26 MED ORDER — ONDANSETRON HCL 4 MG PO TABS: 4.0000 mg | ORAL_TABLET | Freq: Four times a day (QID) | ORAL | 0 refills | Status: AC | PRN

## 2022-11-26 NOTE — TOC Transition Note (Signed)
Transition of Care [facility]) - CM/SW Discharge Note   Patient Details  Name: [name] MRN: [id number] Date of Birth: 07-19-1945  Transition of Care [facility]) CM/SW Contact:  Otelia Santee, LCSW Phone Number: 11/26/2022, 12:04 PM   Clinical Narrative:    Pt's insurance approved for SNF. Auth ID: [id number]. Auth valid starting 11/15 to 11/22. Pt is able to transfer to SNF at [facility] pending EEG. Pt will be going to room 406-1. RN to call report to [phone]. DC packet placed at RN station. If pt able to DC today PTAR can be called at [phone].    Final next level of care: Skilled Nursing Facility Barriers to Discharge: Barriers Resolved   Patient Goals and CMS Choice CMS Medicare.gov Compare Post Acute Care list provided to:: Patient Choice offered to / list presented to : Patient  Discharge Placement PASRR number recieved: 11/24/22 PASRR number recieved: 11/24/22            Patient chooses bed at: [facility] Patient to be transferred to facility by: PTAR Name of family member notified: Patient and niece Patient and family notified of of transfer: 11/26/22  Discharge Plan and Services Additional resources added to the After Visit Summary for                  DME Arranged: N/A DME Agency: NA                  Social Determinants of Health (SDOH) Interventions SDOH Screenings   Food Insecurity: No Food Insecurity (11/18/2022)  Housing: Low Risk  (11/18/2022)  Transportation Needs: No Transportation Needs (11/18/2022)  Utilities: Not At Risk (11/18/2022)  Tobacco Use: Medium Risk (11/18/2022)     Readmission Risk Interventions    11/26/2022   12:03 PM 11/19/2022   11:49 AM  Readmission Risk Prevention Plan  Transportation Screening Complete Complete  PCP or Specialist Appt within 5-7 Days Complete Complete  Home Care Screening Complete Complete  Medication Review (RN CM) Complete Complete

## 2022-11-26 NOTE — Discharge Summary (Signed)
Physician Discharge Summary   Patient: [name] MRN: [id number] DOB: July 10, 1945  Admit date:     11/18/2022  Discharge date: 11/26/22  Discharge Physician: Marguerita Merles, DO   PCP: Merri Brunette, MD   Recommendations at discharge:   Follow-up with PCP within 1 to 2 weeks and repeat CBC, CMP, mag, Phos within 1 week Follow-up with gastroenterology Dr. Marina Goodell in outpatient setting Follow-up with neurology in outpatient setting  Discharge Diagnoses: Principal Problem:   UTI (urinary tract infection) Active Problems:   Convulsions/seizures (HCC)   Hypertension   Hyperlipidemia   Hypothyroidism   History of  seizures   Depression   GERD   Acute renal failure (HCC)   Hypokalemia   Dehydration   Hypomagnesemia  Resolved Problems:   * No resolved hospital problems. [facility] Course: The patient 77 year old female history of anxiety, CKD stage IIIa, CAD, esophageal dysmotility and stricture due to GERD presented to the ED with acute on chronic renal failure UTI and dysphagia.  She completed a 5-day course of IV ceftriaxone for her UTI and renal function started slowly improving.  Her dysphagia was worked up and the case was discussed with GI who recommended no inpatient GI consultation at this time given that she is tolerating tolerating liquids.  Subsequently she started on a dysphagia 2 diet which she is seem to be tolerating.  She continues to be extremely weak and so PT OT recommending SNF and awaiting English as a second language teacher.  Insurance authorization has been obtained and she is stable for discharge   Prior to D/C she was worked up for her right hand weakness with MRI and EEG and will need to follow up with Neurology in the outpatient setting.  Given that she is medically stable she also need to follow-up with her PCP as well as gastroenterology in outpatient setting.  Assessment and Plan:  Suspected UTI -Patient noted on urinalysis was cloudy, large hemoglobin, trace  ketones, small leukocytes, nitrite negative, few bacteria, WBC >50. -Patient recently seen in the ED received a dose of IV -Rocephin for presumed UTI, urine cultures were not obtained and patient was not sent out on further antibiotics at that time. -Urine cultures obtained with no growth to date however patient did receive IV antibiotics a few days ago when she presented to the ED. -Patient was placed on IV Ceftriaxone during the hospitalization and has completed a 5-day course of treatment.   -No further antibiotics needed.    Acute Kidney Injury on CKD stage IIIa Metabolic Acidosis -Patient noted on admission with creatinine of 2.09 with baseline creatinine of approximately 1.2 -BUN/Cr Trend: Recent Labs  Lab 11/21/22 0744 11/21/22 1702 11/22/22 0516 11/23/22 0505 11/24/22 0516 11/25/22 0521 11/26/22 0512  BUN 12 14 16 19 22 19  25*  CREATININE 1.28* 1.44* 1.32* 1.26* 1.29* 1.47* 1.49*  -Metabolic acidosis has improved as she is a CO2 25, anion gap of 7, chloride level 102 -Likely secondary to prerenal azotemia in the setting of dehydration and poor oral intake, in the setting of ARB. -Improved with IVF hydration and this was resumed today prior to discharge -Avoid Nephrotoxic Medications and continue to hold ARB, Contrast Dyes, Hypotension and Dehydration to Ensure Adequate Renal Perfusion and will need to Renally Adjust Meds -Continue to Monitor and Trend Renal Function carefully and repeat CMP within 1 week -C/w Supportive care.   Dysphagia -Patient with history of esophageal dysmotility and esophageal stricture with last dilatation January 2021. -Patient noted to be followed by  [facility], Dr. Marina Goodell as outpatient. -Admitting physician, Dr. Kirby Crigler discussed briefly with Dr. Marina Goodell who knows the patient well and stated his symptoms were probably due to dysmotility and no inpatient GI consultation needed at this time. -Patient currently tolerating clear liquids.   -Esophagram  done 11/19/2022 noted to be abnormal with mild esophageal dysmotility, smooth mild narrowing of the distal esophageal lumen preventing the passage of a 13 mm barium tablet, suspicious for mild recurrent stricture.  No mucosal irregularity identified.   -Case discussed with patient's primary gastroenterologist Dr. Marina Goodell who reviewed esophagram and felt patient's dysphagia was chronic and recommended outpatient follow-up for reevaluation and consideration of repeat endoscopy.   -Patient started on a dysphagia 2 diet which she seems to be tolerating.   -Continue to Follow as an outpatient.   Electrolyte Abnormalities including Hyponatremia, Hypokalemia, and Hypomagnesemia -Na+,K+, and Mag Trend: Recent Labs  Lab 11/21/22 0744 11/21/22 1702 11/22/22 0516 11/23/22 0505 11/24/22 0515 11/24/22 0516 11/25/22 0521 11/26/22 0512  NA 139 138 132* 134*  --  133* 136 134*  K 2.3* 3.9 3.6 3.6  --  3.8 4.1 4.2  MG 1.2*  --  2.4 1.8   < >  --  1.9 1.6*   < > = values in this interval not displayed.  -Replete with IV Mag Sulfate 2 grams again -Na+ a little low but recommending to continue to Follow  -Continue to Monitor and Trend and repeat CMP, Mag within 1 week    Right Upper Extremity Weakness, stable -Patient noted some right upper extremity weakness per OT yesterday which was worse on the left side and had decreased right range of motion. -Patient with no visual changes, no dysarthric speech.   -She denied any new onset weakness, numbness and tingling or changes in her vision or deficits in balance and relates her right-sided weakness to arthritis -Patient with a baseline Left arm tremor. -CT head on admission was unremarkable. -Repeat head CT done and showed "No evidence of acute intracranial abnormality. Chronic left perisylvian polymicrogyria. Chronic microvascular ischemic change." -Discussed with Neuro Dr. Wilford Corner who recommended obtaining an MRI and EEG for completion.    GERD/GI  Prophylaxis -C/w PPI with Pantoprazole 40 mg po BID   Lactic Acidosis -Secondary to dehydration. -Lactic Acid Level Trend: Recent Labs  Lab 11/15/22 1107 11/15/22 1437  LATICACIDVEN 2.0* 1.8  -Improved with IV fluids.  Hypertension -BP better controlled with resumption of home regimen of Nebivolol 2.5 mg po BID and Diltiazem 180 mg po Daily.   -Continue to hold ARB due to AKI.   -C/w IV Hydralazine 5 mg q6hprn HBP >160 -Continue to Monitor BP per Protcol -Last BP reading was 135/100   Anxiety -C/w Duloxetine 30 mg po Daily and Lorazepam 500 mg po BID   Seizure History -C/w Levetiracetam 500 mg po BID -C/w Seizure Precautions -Ordering EEG given Left Arm Tremor and it showed "This study is suggestive of mild diffuse encephalopathy. The excessive beta activity seen in the background is most likely due to the effect of benzodiazepine and is a benign EEG pattern. No seizures or epileptiform discharges were seen throughout the recording."   Hyperlipidemia -Continuing Rosuvastatin 40 mg po Daily but may need to hold given slightly abnormal AST  Abnormal AST -Mild and Likely Reactive -AST Trend: Recent Labs  Lab 11/15/22 1106 11/18/22 0926 11/24/22 0515 11/25/22 0521 11/26/22 0512  AST 27 68* 35 64* 74*  -If continues to be elevated in outpatient setting recommend holding statin  and pursuing further workup with a right upper quadrant ultrasound and acute hepatitis panel   Hypothyroidism -Continue Levothyroxine 88 mcg po Daily.  Normocytic Anemia -Hgb/Hct Trend: Recent Labs  Lab 11/20/22 0824 11/21/22 0744 11/22/22 0516 11/23/22 0505 11/24/22 0516 11/25/22 0521 11/26/22 0512  HGB 13.4 12.8 12.2 11.7* 11.0* 12.2 12.0  HCT 44.9 40.1 38.0 38.3 35.8* 39.0 38.6  MCV 94.5 87.9 88.0 91.2 91.1 90.5 90.6  -Checked Anemia Panel and it showed an iron level of 51, UIBC 134, TIBC 185, saturation ratios of 28%, ferritin level 352, folate level 6.9, vitamin B12 183 -Will start  vitamin B12 supplementation with 1000 mcg p.o. daily -Continue to Monitor for S/Sx of Bleeding; no overt bleeding -Repeat CBC in the AM  Hypoalbuminemia -Patient's Albumin Trend: Recent Labs  Lab 11/15/22 1106 11/18/22 0926 11/20/22 0824 11/24/22 0515 11/25/22 0521 11/26/22 0512  ALBUMIN 3.7 4.5 3.2* 2.5* 2.7* 2.7*  -Continue to Monitor and Trend and repeat CMP in the AM  Consultants: Discussed with Gastroenterology and Neurology Procedures performed: As delineated as above  Disposition: Skilled nursing facility  Diet recommendation:  Cardiac diet - DYSPHAGIA 2 Diet DISCHARGE MEDICATION: Allergies as of 11/26/2022   No Known Allergies      Medication List     TAKE these medications    acetaminophen 325 MG tablet Commonly known as: TYLENOL Take 2 tablets (650 mg total) by mouth every 6 (six) hours as needed for mild pain (pain score 1-3) (or Fever >/= 101).   albuterol (2.5 MG/3ML) 0.083% nebulizer solution Commonly known as: PROVENTIL Take 3 mLs (2.5 mg total) by nebulization every 2 (two) hours as needed for wheezing.   ALPRAZolam 0.25 MG tablet Commonly known as: XANAX Take 1 tablet (0.25 mg total) by mouth 2 (two) times daily.   ARIPiprazole 2 MG tablet Commonly known as: ABILIFY Take 2 mg by mouth daily.   aspirin EC 81 MG tablet Take 81 mg by mouth daily.   calcitRIOL 0.25 MCG capsule Commonly known as: ROCALTROL Take 0.25 mcg by mouth daily.   [employer] D3 50 MCG (2000 UT) Caps Generic drug: Cholecalciferol Take 2,000 Units by mouth daily.   cyanocobalamin 1000 MCG tablet Take 1 tablet (1,000 mcg total) by mouth daily.   diltiazem 180 MG 24 hr tablet Commonly known as: Cardizem LA Take 1 tablet (180 mg total) by mouth daily.   DULoxetine 60 MG capsule Commonly known as: CYMBALTA Take 30 mg by mouth daily.   HYDROcodone-acetaminophen 10-325 MG tablet Commonly known as: NORCO Take 1 tablet by mouth every 6 (six) hours as needed for moderate pain  (pain score 4-6) or severe pain (pain score 7-10).   levETIRAcetam 500 MG tablet Commonly known as: KEPPRA TAKE 1 TABLET BY MOUTH TWICE A DAY   levothyroxine 100 MCG tablet Commonly known as: SYNTHROID Take 88 mcg by mouth every morning.   losartan 25 MG tablet Commonly known as: COZAAR Take 2 tablets (50 mg total) by mouth daily. What changed: medication strength   magnesium oxide 400 (240 Mg) MG tablet Commonly known as: MAG-OX Take 400 mg by mouth 2 (two) times daily.   moxifloxacin 0.5 % ophthalmic solution Commonly known as: VIGAMOX PLEASE SEE ATTACHED FOR DETAILED DIRECTIONS   nebivolol 5 MG tablet Commonly known as: BYSTOLIC Take 0.5 tablets (2.5 mg total) by mouth in the morning and at bedtime.   nitroGLYCERIN 0.4 MG SL tablet Commonly known as: NITROSTAT PLACE 1 TABLET (0.4 MG TOTAL) UNDER THE TONGUE EVERY 5 (  FIVE) MINUTES AS NEEDED FOR CHEST PAIN.   ondansetron 4 MG tablet Commonly known as: ZOFRAN Take 1 tablet (4 mg total) by mouth every 6 (six) hours as needed for nausea.   pantoprazole 40 MG tablet Commonly known as: PROTONIX TAKE 1 TABLET (40 MG TOTAL) BY MOUTH 2 (TWO) TIMES DAILY. OFFICE VISIT FOR FURTHER REFILLS   potassium chloride SA 20 MEQ tablet Commonly known as: KLOR-CON M Take 20 mEq by mouth 2 (two) times daily.   prednisoLONE acetate 1 % ophthalmic suspension Commonly known as: PRED FORTE PLEASE SEE ATTACHED FOR DETAILED DIRECTIONS   rosuvastatin 40 MG tablet Commonly known as: CRESTOR Take 1 tablet (40 mg total) by mouth daily.               Durable Medical Equipment  (From admission, onward)           Start     Ordered   11/20/22 1523  For home use only DME Walker rolling  Once       Question Answer Comment  Walker: With 5 Inch Wheels   Patient needs a walker to treat with the following condition Difficulty in walking, not elsewhere classified      11/20/22 1523            Contact information for  after-discharge care     Destination     [facility] .   Service: Skilled Nursing Contact information: [address] [place] [address] [phone]                    Discharge Exam: Ceasar Mons Weights   11/18/22 0816 11/18/22 0831  Weight: 44.9 kg 44.9 kg   Vitals:   25-Dec-2022 0434 12/25/2022 1350  BP: 133/81 (!) 135/100  Pulse: (!) 58 66  Resp: 13 19  Temp: 97.6 F (36.4 C) 98 F (36.7 C)  SpO2: 97% 98%   Examination: Physical Exam:  Constitutional: Elderly chronically ill-appearing Caucasian female in no acute distress Respiratory: Diminished to auscultation bilaterally, no wheezing, rales, rhonchi or crackles. Normal respiratory effort and patient is not tachypenic. No accessory muscle use.  Unlabored breathing Cardiovascular: RRR, no murmurs / rubs / gallops. S1 and S2 auscultated. No extremity edema. 2+ pedal pulses. No carotid bruits.  Abdomen: Soft, non-tender, non-distended. Bowel sounds positive.  GU: Deferred. Musculoskeletal: No clubbing / cyanosis of digits/nails. No joint deformity upper and lower extremities.  Skin: No rashes, lesions, ulcers on a limited skin evaluation. No induration; Warm and dry.  Neurologic: CN 2-12 grossly intact with no focal deficits.  Has a tremor on the left arm and slight on the right side of the face.  Romberg sign cerebellar reflexes not assessed.  Psychiatric: Normal judgment and insight. Alert and oriented x 3. Normal mood and appropriate affect.   Condition at discharge: stable  The results of significant diagnostics from this hospitalization (including imaging, microbiology, ancillary and laboratory) are listed below for reference.   Imaging Studies: EEG adult  Result Date: 12/25/22 Charlsie Quest, MD     12-25-22  6:41 PM Patient Name: DYNA DWIGHT MRN: [id number] Epilepsy Attending: Charlsie Quest Referring Physician/Provider: Merlene Laughter, DO Date:  25-Dec-2022 Duration: 24.08 mins Patient history: 77 yo F with right-sided weakness and tremulousness that is been going on for a few days. EEG to evaluate for seizure Level of alertness: Awake, asleep AEDs during EEG study: Xanax Technical aspects: This EEG study was done with scalp electrodes  positioned according to the 10-20 International system of electrode placement. Electrical activity was reviewed with band pass filter of 1-70Hz , sensitivity of 7 uV/mm, display speed of 94mm/sec with a 60Hz  notched filter applied as appropriate. EEG data were recorded continuously and digitally stored.  Video monitoring was available and reviewed as appropriate. Description: The posterior dominant rhythm consists of 8 Hz activity of moderate voltage (25-35 uV) seen predominantly in posterior head regions, symmetric and reactive to eye opening and eye closing. Sleep was characterized by vertex waves, sleep spindles (12 to 14 Hz), maximal frontocentral region. There is an excessive amount of 15 to 18 Hz beta activity distributed symmetrically and diffusely. EEG also showed intermittent generalized 5 to 7 Hz theta slowing. Hyperventilation and photic stimulation were not performed.   ABNORMALITY - Intermittent slow, generalized - Excessive beta, generalized IMPRESSION: This study is suggestive of mild diffuse encephalopathy. The excessive beta activity seen in the background is most likely due to the effect of benzodiazepine and is a benign EEG pattern. No seizures or epileptiform discharges were seen throughout the recording. Charlsie Quest   MR BRAIN WO CONTRAST  Result Date: 11/26/2022 CLINICAL DATA:  Initial evaluation for neuro deficit, stroke suspected. EXAM: MRI HEAD WITHOUT CONTRAST TECHNIQUE: Multiplanar, multiecho pulse sequences of the brain and surrounding structures were obtained without intravenous contrast. COMPARISON:  Prior CT from 11/23/2022 and MRI from 12/05/2013. FINDINGS: Brain: Diffuse prominence of  the CSF containing spaces compatible generalized age-related cerebral atrophy. Patchy T2/FLAIR hyperintensity involving the periventricular and deep white matter both cerebral hemispheres, consistent with chronic small vessel ischemic disease, moderately advanced in nature. Few small remote lacunar infarcts present about the bilateral basal ganglia, thalami, midbrain, and pons. Few tiny remote cerebellar infarcts noted. 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 No evidence for acute or subacute ischemia. Gray-white matter differentiation maintained. No acute intracranial hemorrhage. No significant acute or chronic intracranial blood products. 8 mm extra-axial mass overlying the left frontal convexity, consistent with a small meningioma (series 17, image 20). No associated edema or mass effect. No other mass lesion. No midline shift or hydrocephalus. No extra-axial fluid collection. Pituitary gland and suprasellar region within normal limits. Vascular: Major intracranial vascular flow voids are maintained. Skull and upper cervical spine: Craniocervical junction within normal limits. Bone marrow signal intensity normal. No scalp soft tissue abnormality. Sinuses/Orbits: Prior ocular lens replacement on the left. Paranasal sinuses are clear. Trace left with small right mastoid effusions, of doubtful significance. Visualized nasopharynx unremarkable. Other: None. IMPRESSION: 1. No acute intracranial abnormality. 2. Age-related cerebral atrophy with moderate chronic microvascular ischemic disease, with a few small remote lacunar infarcts involving the bilateral basal ganglia, thalami, midbrain, and pons. 3. 8 mm meningioma overlying the left frontal convexity without mass effect or edema. 4. 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 Electronically  Signed   By: Rise Mu M.D.   On: 11/26/2022 05:07   CT HEAD WO CONTRAST ( )  Result Date: 11/23/2022 CLINICAL DATA:  Transient ischemic attack (TIA) RUE weakness/discordination EXAM: CT HEAD WITHOUT CONTRAST TECHNIQUE: Contiguous axial images were obtained from the base of the skull through the vertex without intravenous contrast. RADIATION DOSE REDUCTION: This exam was performed according to the departmental dose-optimization program which includes automated exposure control, adjustment of the mA and/or kV according to patient size and/or use of iterative reconstruction technique. COMPARISON:  CT head November 18, 2022. FINDINGS: Brain: No  evidence of acute large vascular territory infarct, acute hemorrhage, mass lesion, midline shift or hydrocephalus. Chronic left perisylvian polymicrogyria. Small remote pontine infarct. Patchy additional white matter hypodensities are nonspecific but compatible with chronic microvascular ischemic disease. Vascular: No hyperdense vessel identified. Skull: No acute fracture. Sinuses/Orbits: Clear sinuses.  No acute orbital findings. Other: No mastoid effusions. IMPRESSION: 1. No evidence of acute intracranial abnormality. 2. Chronic left perisylvian polymicrogyria. 3. Chronic microvascular ischemic change. Electronically Signed   By: Feliberto Harts M.D.   On: 11/23/2022 12:35   DG ESOPHAGUS W SINGLE CM (SOL OR THIN BA)  Result Date: 11/19/2022 CLINICAL DATA:  77 year old inpatient with dysphagia. Bedside evaluation by speech pathology demonstrated esophageal dysphagia. History of remote esophageal dilatation (2007). EXAM: ESOPHOGRAM/BARIUM SWALLOW TECHNIQUE: Single contrast examination was performed using  thin barium. FLUOROSCOPY: Radiation Exposure Index (as provided by the fluoroscopic device): 10.2 mGy Kerma COMPARISON:  Chest radiographs 11/18/2022.  Chest CT 02/19/2019. FINDINGS: Patient has limited mobility and limited ability to bear weight. A  limited single contrast examination was performed in the semi erect, supine and right lateral decubitus positions. The patient swallowed the barium without difficulty. No laryngeal penetration or aspiration was observed during the examination. There is mild esophageal dysmotility with a mildly decreased primary stripping wave. There is smooth narrowing of the distal esophageal lumen without evidence of esophageal mass or significant hiatal hernia. This prevented the passage of a 13 mm barium tablet which was retained for 3-4 minutes despite multiple swallows of water and thin barium. Previous median sternotomy and prominent mitral annular calcifications are noted. IMPRESSION: 1. Limited single contrast examination due to patient mobility and limited ability to bear weight. 2. Mild esophageal dysmotility. 3. Smooth mild narrowing of the distal esophageal lumen preventing the passage of a 13 mm barium tablet, suspicious for a mild recurrent stricture. No mucosal irregularity identified. Electronically Signed   By: Carey Bullocks M.D.   On: 11/19/2022 12:29   DG Chest 2 View  Result Date: 11/18/2022 CLINICAL DATA:  Generalized weakness and fatigue. EXAM: CHEST - 2 VIEW COMPARISON:  Chest radiograph 09/15/2021. FINDINGS: Unchanged mild elevation of the right hemidiaphragm. Annular calcifications of the mitral valve. Postoperative changes of median sternotomy and CABG. Clear lungs. No pleural effusion or pneumothorax. IMPRESSION: No evidence of acute cardiopulmonary disease. Electronically Signed   By: Orvan Falconer M.D.   On: 11/18/2022 09:52   CT Head Wo Contrast  Result Date: 11/18/2022 CLINICAL DATA:  Mental status change, unknown cause. Difficulty walking and difficulty swallowing. EXAM: CT HEAD WITHOUT CONTRAST TECHNIQUE: Contiguous axial images were obtained from the base of the skull through the vertex without intravenous contrast. RADIATION DOSE REDUCTION: This exam was performed according to the  departmental dose-optimization program which includes automated exposure control, adjustment of the mA and/or kV according to patient size and/or use of iterative reconstruction technique. COMPARISON:  Head CT 11/15/2022. FINDINGS: Brain: No acute hemorrhage. Unchanged moderate chronic small-vessel disease. Cortical gray-white differentiation is preserved. Unchanged findings of left perisylvian polymicrogyria. No hydrocephalus or extra-axial collection. No mass effect or midline shift. Vascular: No hyperdense vessel or unexpected calcification. Skull: No calvarial fracture or suspicious bone lesion. Skull base is unremarkable. Sinuses/Orbits: No acute finding. Other: None. IMPRESSION: 1. No acute intracranial abnormality. 2. Unchanged moderate chronic small-vessel disease. 3. Unchanged findings of left perisylvian polymicrogyria. Electronically Signed   By: Orvan Falconer M.D.   On: 11/18/2022 09:50   CT ABDOMEN PELVIS W CONTRAST  Result Date: 11/15/2022 CLINICAL DATA:  Weight  loss, unintended, leg numbness and generalized weakness. EXAM: CT ABDOMEN AND PELVIS WITH CONTRAST TECHNIQUE: Multidetector CT imaging of the abdomen and pelvis was performed using the standard protocol following bolus administration of intravenous contrast. RADIATION DOSE REDUCTION: This exam was performed according to the departmental dose-optimization program which includes automated exposure control, adjustment of the mA and/or kV according to patient size and/or use of iterative reconstruction technique. CONTRAST:  50mL OMNIPAQUE IOHEXOL 350 MG/ML SOLN COMPARISON:  CT urogram 02/08/2014 FINDINGS: Lower chest: Respiratory motion obscures detail. No acute abnormality. Hepatobiliary: Ill-defined hypoattenuation within the posteromedial right hepatic lobe favored due to perfusion anomalies. Underlying mass is difficult to exclude. Gallbladder and biliary tree are unremarkable. Pancreas: Fatty atrophy.  No acute abnormality. Spleen:  Unremarkable. Adrenals/Urinary Tract: Stable adrenal glands. Bilateral cortical renal scarring. Nonobstructing renal calculi in the right kidney. No hydronephrosis. Nondistended thick-walled bladder. Stomach/Bowel: Stomach is within normal limits. No obstruction or bowel wall thickening. Normal appendix. Vascular/Lymphatic: Aortic atherosclerosis. No enlarged abdominal or pelvic lymph nodes. Reproductive: Uterus and bilateral adnexa are unremarkable. Other: No free intraperitoneal fluid or air. Musculoskeletal: No acute fracture or destructive osseous lesion. IMPRESSION: 1. No acute abnormality in the abdomen or pelvis. 2. Ill-defined hypoattenuation within the posteromedial right hepatic lobe favored due to perfusion anomalies. Underlying mass is difficult to exclude. Recommend further evaluation with nonemergent MRI abdomen with and without contrast. 3. Nonobstructing right nephrolithiasis. 4. Bladder wall thickening may be due to underdistention or cystitis. Aortic Atherosclerosis (ICD10-I70.0). Electronically Signed   By: Minerva Fester M.D.   On: 11/15/2022 19:28   CT Head Wo Contrast  Result Date: 11/15/2022 CLINICAL DATA:  Mental status change, unknown cause. Ongoing leg numbness and generalized weakness. EXAM: CT HEAD WITHOUT CONTRAST TECHNIQUE: Contiguous axial images were obtained from the base of the skull through the vertex without intravenous contrast. RADIATION DOSE REDUCTION: This exam was performed according to the departmental dose-optimization program which includes automated exposure control, adjustment of the mA and/or kV according to patient size and/or use of iterative reconstruction technique. COMPARISON:  Head CT 09/13/2021.  Brain MRI 12/05/2013. FINDINGS: Brain: No acute hemorrhage. Unchanged patchy hypoattenuation of the cerebral white matter, most consistent with moderate chronic small-vessel disease. Cortical gray-white differentiation is preserved. Unchanged findings of left  perisylvian polymicrogyria. No hydrocephalus or extra-axial collection. No mass effect or midline shift. Vascular: No hyperdense vessel or unexpected calcification. Skull: No calvarial fracture or suspicious bone lesion. Skull base is unremarkable. Sinuses/Orbits: No acute finding. Other: None. IMPRESSION: 1. No acute intracranial abnormality. 2. Unchanged findings of left perisylvian polymicrogyria. 3. Unchanged moderate chronic small-vessel disease. Electronically Signed   By: Orvan Falconer M.D.   On: 11/15/2022 18:46    Microbiology: Results for orders placed or performed during the hospital encounter of 11/18/22  Urine Culture     Status: None   Collection Time: 11/18/22  8:52 AM   Specimen: Urine, Clean Catch  Result Value Ref Range Status   Specimen Description   Final    URINE, CLEAN CATCH Performed at [facility], [address] [place], [place] [address]    Special Requests   Final    NONE Performed at [facility], [address], [place], [place] [address]    Culture   Final    NO GROWTH Performed at [facility], [address] [place], [place] [address]    Report Status 11/19/2022 FINAL  Final   *Note: Due to a large number of results and/or encounters for  the requested time period, some results have not been displayed. A complete set of results can be found in Results Review.   Labs: CBC: Recent Labs  Lab 11/22/22 0516 11/23/22 0505 11/24/22 0516 11/25/22 0521 11/26/22 0512  WBC 13.4* 9.5 6.9 5.4 6.9  NEUTROABS  --  5.6  --  2.2 2.8  HGB 12.2 11.7* 11.0* 12.2 12.0  HCT 38.0 38.3 35.8* 39.0 38.6  MCV 88.0 91.2 91.1 90.5 90.6  PLT 253 213 231 283 334   Basic Metabolic Panel: Recent Labs  Lab 11/20/22 0824 11/21/22 0744 11/22/22 0516 11/23/22 0505 11/24/22 0515 11/24/22 0516 11/25/22 0521 11/26/22 0512  NA 142   < > 132* 134*  --  133* 136 134*  K 3.1*   < > 3.6 3.6  --  3.8 4.1 4.2  CL 111   < > 102  105  --  104 104 102  CO2 20*   < > 22 20*  --  21* 24 25  GLUCOSE 91   < > 107* 91  --  94 102* 105*  BUN 13   < > 16 19  --  22 19 25*  CREATININE 1.31*   < > 1.32* 1.26*  --  1.29* 1.47* 1.49*  CALCIUM 7.4*   < > 7.2* 7.6*  --  7.9* 8.5* 8.9  MG 2.2   < > 2.4 1.8 1.6*  --  1.9 1.6*  PHOS 2.8  --   --   --  3.0  --  3.9 4.1   < > = values in this interval not displayed.   Liver Function Tests: Recent Labs  Lab 11/20/22 0824 11/24/22 0515 11/25/22 0521 11/26/22 0512  AST  --  35 64* 74*  ALT  --  23 36 44  ALKPHOS  --  65 75 81  BILITOT  --  0.5 0.4 0.5  PROT  --  5.5* 6.0* 6.1*  ALBUMIN 3.2* 2.5* 2.7* 2.7*   CBG: No results for input(s): "GLUCAP" in the last 168 hours.  Discharge time spent: greater than 30 minutes.  Signed: Marguerita Merles, DO [facility] 11/26/2022

## 2022-11-26 NOTE — Progress Notes (Signed)
EEG complete - results pending 

## 2022-11-26 NOTE — Plan of Care (Signed)
  Problem: Education: Goal: Knowledge of General Education information will improve Description: Including pain rating scale, medication(s)/side effects and non-pharmacologic comfort measures Outcome: Progressing   Problem: Clinical Measurements: Goal: Ability to maintain clinical measurements within normal limits will improve Outcome: Progressing Goal: Will remain free from infection Outcome: Progressing   Problem: Activity: Goal: Risk for activity intolerance will decrease Outcome: Progressing   Problem: Nutrition: Goal: Adequate nutrition will be maintained Outcome: Progressing   Problem: Pain Management: Goal: General experience of comfort will improve Outcome: Progressing   Problem: Safety: Goal: Ability to remain free from injury will improve Outcome: Progressing   Problem: Urinary Elimination: Goal: Signs and symptoms of infection will decrease Outcome: Progressing

## 2022-11-26 NOTE — Progress Notes (Signed)
Mobility Specialist - Progress Note   11/26/22 1010  Mobility  Activity Ambulated with assistance in hallway  Level of Assistance Minimal assist, patient does 75% or more  Assistive Device Front wheel walker  Distance Ambulated (ft) 110 ft  Range of Motion/Exercises Active Assistive  Activity Response Tolerated well  Mobility Referral Yes  $Mobility charge 1 Mobility  Mobility Specialist Start Time (ACUTE ONLY) 0950  Mobility Specialist Stop Time (ACUTE ONLY) 1010  Mobility Specialist Time Calculation (min) (ACUTE ONLY) 20 min   Pt was found in bed and agreeable to ambulate. Was min-A for bed mobility and CG for ambulation. Requires cues for STS for hand placement. Pt returned to recliner chair with all needs met. Call bell in reach and chair alarm on.  Billey Chang Mobility Specialist

## 2022-11-26 NOTE — Procedures (Signed)
Patient Name: [name]  MRN: [id number]  Epilepsy Attending: Charlsie Quest  Referring Physician/Provider: Merlene Laughter, DO  Date: 11/26/2022 Duration: 24.08 mins  Patient history: 77 yo F with right-sided weakness and tremulousness that is been going on for a few days. EEG to evaluate for seizure  Level of alertness: Awake, asleep  AEDs during EEG study: Xanax  Technical aspects: This EEG study was done with scalp electrodes positioned according to the 10-20 International system of electrode placement. Electrical activity was reviewed with band pass filter of 1-70Hz , sensitivity of 7 uV/mm, display speed of 66mm/sec with a 60Hz  notched filter applied as appropriate. EEG data were recorded continuously and digitally stored.  Video monitoring was available and reviewed as appropriate.  Description: The posterior dominant rhythm consists of 8 Hz activity of moderate voltage (25-35 uV) seen predominantly in posterior head regions, symmetric and reactive to eye opening and eye closing. Sleep was characterized by vertex waves, sleep spindles (12 to 14 Hz), maximal frontocentral region. There is an excessive amount of 15 to 18 Hz beta activity distributed symmetrically and diffusely.  EEG also showed intermittent generalized 5 to 7 Hz theta slowing. Hyperventilation and photic stimulation were not performed.     ABNORMALITY - Intermittent slow, generalized - Excessive beta, generalized  IMPRESSION: This study is suggestive of mild diffuse encephalopathy. The excessive beta activity seen in the background is most likely due to the effect of benzodiazepine and is a benign EEG pattern. No seizures or epileptiform discharges were seen throughout the recording.  Jamyla Ard Annabelle Harman

## 2022-11-27 DIAGNOSIS — R1319 Other dysphagia: Secondary | ICD-10-CM | POA: Diagnosis not present

## 2022-11-27 DIAGNOSIS — G8323 Monoplegia of upper limb affecting right nondominant side: Secondary | ICD-10-CM | POA: Diagnosis not present

## 2022-11-27 DIAGNOSIS — N390 Urinary tract infection, site not specified: Secondary | ICD-10-CM | POA: Diagnosis not present

## 2022-11-27 DIAGNOSIS — R531 Weakness: Secondary | ICD-10-CM | POA: Diagnosis not present

## 2022-11-27 DIAGNOSIS — R41841 Cognitive communication deficit: Secondary | ICD-10-CM | POA: Diagnosis not present

## 2022-11-27 DIAGNOSIS — M6281 Muscle weakness (generalized): Secondary | ICD-10-CM | POA: Diagnosis not present

## 2022-11-27 DIAGNOSIS — M6289 Other specified disorders of muscle: Secondary | ICD-10-CM | POA: Diagnosis not present

## 2022-11-27 DIAGNOSIS — R471 Dysarthria and anarthria: Secondary | ICD-10-CM | POA: Diagnosis not present

## 2022-11-27 DIAGNOSIS — Z7401 Bed confinement status: Secondary | ICD-10-CM | POA: Diagnosis not present

## 2022-11-27 DIAGNOSIS — R278 Other lack of coordination: Secondary | ICD-10-CM | POA: Diagnosis not present

## 2022-11-27 DIAGNOSIS — R2689 Other abnormalities of gait and mobility: Secondary | ICD-10-CM | POA: Diagnosis not present

## 2022-11-27 DIAGNOSIS — E039 Hypothyroidism, unspecified: Secondary | ICD-10-CM | POA: Diagnosis not present

## 2022-11-27 NOTE — Plan of Care (Signed)
  Problem: Education: Goal: Knowledge of General Education information will improve Description: Including pain rating scale, medication(s)/side effects and non-pharmacologic comfort measures Outcome: Adequate for Discharge   Problem: Health Behavior/Discharge Planning: Goal: Ability to manage health-related needs will improve Outcome: Adequate for Discharge   Problem: Clinical Measurements: Goal: Ability to maintain clinical measurements within normal limits will improve Outcome: Adequate for Discharge Goal: Will remain free from infection Outcome: Adequate for Discharge Goal: Diagnostic test results will improve Outcome: Adequate for Discharge Goal: Respiratory complications will improve Outcome: Adequate for Discharge Goal: Cardiovascular complication will be avoided Outcome: Adequate for Discharge   Problem: Activity: Goal: Risk for activity intolerance will decrease Outcome: Adequate for Discharge   Problem: Nutrition: Goal: Adequate nutrition will be maintained Outcome: Adequate for Discharge   Problem: Pain Management: Goal: General experience of comfort will improve Outcome: Adequate for Discharge   Problem: Elimination: Goal: Will not experience complications related to bowel motility Outcome: Adequate for Discharge Goal: Will not experience complications related to urinary retention Outcome: Adequate for Discharge

## 2022-11-27 NOTE — Progress Notes (Signed)
Telephone called to facility at 1056. No answer. RN called back 11:12 AM. Report given to [facility] LPN.

## 2022-11-27 NOTE — Plan of Care (Signed)
Pt is A&O x3, forgetful, VSS, on room air. Denies pain. Safety maintained. Bed alarm on. Call bell in reach. Will continue to monitor.   Problem: Education: Goal: Knowledge of General Education information will improve Description: Including pain rating scale, medication(s)/side effects and non-pharmacologic comfort measures Outcome: Progressing   Problem: Health Behavior/Discharge Planning: Goal: Ability to manage health-related needs will improve Outcome: Progressing   Problem: Clinical Measurements: Goal: Ability to maintain clinical measurements within normal limits will improve Outcome: Progressing Goal: Will remain free from infection Outcome: Progressing Goal: Diagnostic test results will improve Outcome: Progressing Goal: Respiratory complications will improve Outcome: Progressing Goal: Cardiovascular complication will be avoided Outcome: Progressing   Problem: Activity: Goal: Risk for activity intolerance will decrease Outcome: Progressing   Problem: Nutrition: Goal: Adequate nutrition will be maintained Outcome: Progressing   Problem: Coping: Goal: Level of anxiety will decrease Outcome: Progressing   Problem: Elimination: Goal: Will not experience complications related to bowel motility Outcome: Progressing Goal: Will not experience complications related to urinary retention Outcome: Progressing   Problem: Pain Management: Goal: General experience of comfort will improve Outcome: Progressing   Problem: Safety: Goal: Ability to remain free from injury will improve Outcome: Progressing   Problem: Skin Integrity: Goal: Risk for impaired skin integrity will decrease Outcome: Progressing   Problem: Urinary Elimination: Goal: Signs and symptoms of infection will decrease Outcome: Progressing

## 2022-11-27 NOTE — TOC Progression Note (Signed)
Transition of Care [facility]) - Progression Note    Patient Details  Name: [name] MRN: [id number] Date of Birth: Jan 23, 1945  [facility]) CM/SW Contact  Adrian Prows, RN Phone Number: 11/27/2022, 10:25 AM  Clinical Narrative:    Pt ready for d/c; she is going to [facility], RM# [id number]; call report to [phone]; transport by [facility];pt and niece Charlean Merl [phone]) notified and agree to d/c plan; [facility] called at [phone]; spoke w/ operator # [phone]; d/c summary and SNF transfer report sent via SNF hub; no TOC needs     Barriers to Discharge: Barriers Resolved  Expected Discharge Plan and Services         Expected Discharge Date: 11/26/22               DME Arranged: N/A DME Agency: NA                   Social Determinants of Health (SDOH) Interventions SDOH Screenings   Food Insecurity: No Food Insecurity (11/18/2022)  Housing: Low Risk  (11/18/2022)  Transportation Needs: No Transportation Needs (11/18/2022)  Utilities: Not At Risk (11/18/2022)  Tobacco Use: Medium Risk (11/18/2022)    Readmission Risk Interventions    11/26/2022   12:03 PM 11/19/2022   11:49 AM  Readmission Risk Prevention Plan  Transportation Screening Complete Complete  PCP or Specialist Appt within 5-7 Days Complete Complete  Home Care Screening Complete Complete  Medication Review (RN CM) Complete Complete

## 2022-11-27 NOTE — Discharge Summary (Signed)
Physician Discharge Summary   Patient: [name] MRN: [id number] DOB: February 04, 1945  Admit date:     11/18/2022  Discharge date: 11/26/22  Discharge Physician: Marguerita Merles, DO   PCP: Merri Brunette, MD   Recommendations at discharge:   Follow-up with PCP within 1 to 2 weeks and repeat CBC, CMP, mag, Phos within 1 week Follow-up with gastroenterology Dr. Marina Goodell in outpatient setting Follow-up with Neurology in outpatient setting  Discharge Diagnoses: Principal Problem:   UTI (urinary tract infection) Active Problems:   Convulsions/seizures (HCC)   Hypertension   Hyperlipidemia   Hypothyroidism   History of  seizures   Depression   GERD   Acute renal failure (HCC)   Hypokalemia   Dehydration   Hypomagnesemia  Resolved Problems:   * No resolved hospital problems. [facility] Course: The patient 77 year old female history of anxiety, CKD stage IIIa, CAD, esophageal dysmotility and stricture due to GERD presented to the ED with acute on chronic renal failure UTI and dysphagia.  She completed a 5-day course of IV ceftriaxone for her UTI and renal function started slowly improving.  Her dysphagia was worked up and the case was discussed with GI who recommended no inpatient GI consultation at this time given that she is tolerating tolerating liquids.  Subsequently she started on a dysphagia 2 diet which she is seem to be tolerating.  She continues to be extremely weak and so PT OT recommending SNF and awaiting English as a second language teacher.  Insurance authorization has been obtained and she is stable for discharge   Prior to D/C she was worked up for her right hand weakness with MRI and EEG and will need to follow up with Neurology in the outpatient setting.  Given that she is medically stable she also need to follow-up with her PCP as well as gastroenterology in outpatient setting.  ADDENDUM 11/27/22: She was deemed medically stable to D/C yesterday but unfortunately was not able to go due  to transportation issues. Transport is available today and she remains medically stable for D/C and will need follow up with PCP, Neurology, and Gastroenterology in the outpatient setting.  Assessment and Plan:  Suspected UTI -Patient noted on urinalysis was cloudy, large hemoglobin, trace ketones, small leukocytes, nitrite negative, few bacteria, WBC >50. -Patient recently seen in the ED received a dose of IV -Rocephin for presumed UTI, urine cultures were not obtained and patient was not sent out on further antibiotics at that time. -Urine cultures obtained with no growth to date however patient did receive IV antibiotics a few days ago when she presented to the ED. -Patient was placed on IV Ceftriaxone during the hospitalization and has completed a 5-day course of treatment.   -No further antibiotics needed.    Acute Kidney Injury on CKD stage IIIa Metabolic Acidosis -Patient noted on admission with creatinine of 2.09 with baseline creatinine of approximately 1.2 -BUN/Cr Trend: Recent Labs  Lab 11/21/22 0744 11/21/22 1702 11/22/22 0516 11/23/22 0505 11/24/22 0516 11/25/22 0521 11/26/22 0512  BUN 12 14 16 19 22 19  25*  CREATININE 1.28* 1.44* 1.32* 1.26* 1.29* 1.47* 1.49*  -Metabolic acidosis has improved as she is a CO2 25, anion gap of 7, chloride level 102 -Likely secondary to prerenal azotemia in the setting of dehydration and poor oral intake, in the setting of ARB. -Improved with IVF hydration and this was resumed today prior to discharge -Avoid Nephrotoxic Medications and continue to hold ARB, Contrast Dyes, Hypotension and Dehydration to Ensure Adequate Renal Perfusion  and will need to Renally Adjust Meds -Continue to Monitor and Trend Renal Function carefully and repeat CMP within 1 week -C/w Supportive care.   Dysphagia -Patient with history of esophageal dysmotility and esophageal stricture with last dilatation January 2021. -Patient noted to be followed by [facility],  Dr. Marina Goodell as outpatient. -Admitting physician, Dr. Kirby Crigler discussed briefly with Dr. Marina Goodell who knows the patient well and stated his symptoms were probably due to dysmotility and no inpatient GI consultation needed at this time. -Patient currently tolerating clear liquids.   -Esophagram done 11/19/2022 noted to be abnormal with mild esophageal dysmotility, smooth mild narrowing of the distal esophageal lumen preventing the passage of a 13 mm barium tablet, suspicious for mild recurrent stricture.  No mucosal irregularity identified.   -Case discussed with patient's primary gastroenterologist Dr. Marina Goodell who reviewed esophagram and felt patient's dysphagia was chronic and recommended outpatient follow-up for reevaluation and consideration of repeat endoscopy.   -Patient started on a dysphagia 2 diet which she seems to be tolerating.   -Continue to Follow as an outpatient.   Electrolyte Abnormalities including Hyponatremia, Hypokalemia, and Hypomagnesemia -Na+,K+, and Mag Trend: Recent Labs  Lab 11/21/22 0744 11/21/22 1702 11/22/22 0516 11/23/22 0505 11/24/22 0515 11/24/22 0516 11/25/22 0521 11/26/22 0512  NA 139 138 132* 134*  --  133* 136 134*  K 2.3* 3.9 3.6 3.6  --  3.8 4.1 4.2  MG 1.2*  --  2.4 1.8   < >  --  1.9 1.6*   < > = values in this interval not displayed.  -Replete with IV Mag Sulfate 2 grams again -Na+ a little low but recommending to continue to Follow  -Continue to Monitor and Trend and repeat CMP, Mag within 1 week    Right Upper Extremity Weakness, stable -Patient noted some right upper extremity weakness per OT yesterday which was worse on the left side and had decreased right range of motion. -Patient with no visual changes, no dysarthric speech.   -She denied any new onset weakness, numbness and tingling or changes in her vision or deficits in balance and relates her right-sided weakness to arthritis -Patient with a baseline Left arm tremor. -CT head on admission  was unremarkable. -Repeat head CT done and showed "No evidence of acute intracranial abnormality. Chronic left perisylvian polymicrogyria. Chronic microvascular ischemic change." -Discussed with Neuro Dr. Wilford Corner who recommended obtaining an MRI and EEG for completion.  -MRI showed "No acute intracranial abnormality. Age-related cerebral atrophy with moderate chronic microvascular ischemic disease, with a few small remote lacunar infarcts involving the bilateral basal ganglia, thalami, midbrain, and pons. 8 mm meningioma overlying the left frontal convexity without mass effect or edema. 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 -Lactic Acid Level Trend: Recent Labs  Lab 11/15/22 1107 11/15/22 1437  LATICACIDVEN 2.0* 1.8  -Improved with IV fluids.  Hypertension -BP better controlled with resumption of home regimen of Nebivolol 2.5 mg po BID and Diltiazem 180 mg po Daily.   -Continue to hold ARB due to AKI.   -C/w IV Hydralazine 5 mg q6hprn HBP >160 -Continue to Monitor BP per Protcol -Last BP reading was 135/100   Anxiety -C/w Duloxetine 30 mg po Daily and Lorazepam 500 mg po BID   Seizure History -C/w Levetiracetam 500 mg po BID -C/w Seizure Precautions -Ordering EEG  given Left Arm Tremor and it showed "This study is suggestive of mild diffuse encephalopathy. The excessive beta activity seen in the background is most likely due to the effect of benzodiazepine and is a benign EEG pattern. No seizures or epileptiform discharges were seen throughout the recording."   Hyperlipidemia -Continuing Rosuvastatin 40 mg po Daily but may need to hold given slightly abnormal AST  Abnormal AST -Mild and Likely Reactive -AST Trend: Recent Labs  Lab 11/15/22 1106 11/18/22 0926 11/24/22 0515  11/25/22 0521 11/26/22 0512  AST 27 68* 35 64* 74*  -If continues to be elevated in outpatient setting recommend holding statin and pursuing further workup with a right upper quadrant ultrasound and acute hepatitis panel   Hypothyroidism -Continue Levothyroxine 88 mcg po Daily.  Normocytic Anemia -Hgb/Hct Trend: Recent Labs  Lab 11/20/22 0824 11/21/22 0744 11/22/22 0516 11/23/22 0505 11/24/22 0516 11/25/22 0521 11/26/22 0512  HGB 13.4 12.8 12.2 11.7* 11.0* 12.2 12.0  HCT 44.9 40.1 38.0 38.3 35.8* 39.0 38.6  MCV 94.5 87.9 88.0 91.2 91.1 90.5 90.6  -Checked Anemia Panel and it showed an iron level of 51, UIBC 134, TIBC 185, saturation ratios of 28%, ferritin level 352, folate level 6.9, vitamin B12 183 -Will start vitamin B12 supplementation with 1000 mcg p.o. daily -Continue to Monitor for S/Sx of Bleeding; no overt bleeding -Repeat CBC in the AM  Hypoalbuminemia -Patient's Albumin Trend: Recent Labs  Lab 11/15/22 1106 11/18/22 0926 11/20/22 0824 11/24/22 0515 11/25/22 0521 11/26/22 0512  ALBUMIN 3.7 4.5 3.2* 2.5* 2.7* 2.7*  -Continue to Monitor and Trend and repeat CMP in the AM  Consultants: Discussed with Gastroenterology and Neurology Procedures performed: As delineated as above  Disposition: Skilled nursing facility  Diet recommendation:  Cardiac diet - DYSPHAGIA 2 Diet DISCHARGE MEDICATION: Allergies as of 11/27/2022   No Known Allergies      Medication List     TAKE these medications    acetaminophen 325 MG tablet Commonly known as: TYLENOL Take 2 tablets (650 mg total) by mouth every 6 (six) hours as needed for mild pain (pain score 1-3) (or Fever >/= 101).   albuterol (2.5 MG/3ML) 0.083% nebulizer solution Commonly known as: PROVENTIL Take 3 mLs (2.5 mg total) by nebulization every 2 (two) hours as needed for wheezing.   ALPRAZolam 0.25 MG tablet Commonly known as: XANAX Take 1 tablet (0.25 mg total) by mouth 2 (two) times daily.    ARIPiprazole 2 MG tablet Commonly known as: ABILIFY Take 2 mg by mouth daily.   aspirin EC 81 MG tablet Take 81 mg by mouth daily.   calcitRIOL 0.25 MCG capsule Commonly known as: ROCALTROL Take 0.25 mcg by mouth daily.   [employer] D3 50 MCG (2000 UT) Caps Generic drug: Cholecalciferol Take 2,000 Units by mouth daily.   cyanocobalamin 1000 MCG tablet Take 1 tablet (1,000 mcg total) by mouth daily.   diltiazem 180 MG 24 hr tablet Commonly known as: Cardizem LA Take 1 tablet (180 mg total) by mouth daily.   DULoxetine 60 MG capsule Commonly known as: CYMBALTA Take 30 mg by mouth daily.   HYDROcodone-acetaminophen 10-325 MG tablet Commonly known as: NORCO Take 1 tablet by mouth every 6 (six) hours as needed for moderate pain (pain score 4-6) or severe pain (pain score 7-10).   levETIRAcetam 500 MG tablet Commonly known as: KEPPRA TAKE 1 TABLET BY MOUTH TWICE A DAY   levothyroxine 100 MCG tablet Commonly known as: SYNTHROID Take 88 mcg by mouth every morning.  losartan 25 MG tablet Commonly known as: COZAAR Take 2 tablets (50 mg total) by mouth daily. What changed: medication strength   magnesium oxide 400 (240 Mg) MG tablet Commonly known as: MAG-OX Take 400 mg by mouth 2 (two) times daily.   moxifloxacin 0.5 % ophthalmic solution Commonly known as: VIGAMOX PLEASE SEE ATTACHED FOR DETAILED DIRECTIONS   nebivolol 5 MG tablet Commonly known as: BYSTOLIC Take 0.5 tablets (2.5 mg total) by mouth in the morning and at bedtime.   nitroGLYCERIN 0.4 MG SL tablet Commonly known as: NITROSTAT PLACE 1 TABLET (0.4 MG TOTAL) UNDER THE TONGUE EVERY 5 (FIVE) MINUTES AS NEEDED FOR CHEST PAIN.   ondansetron 4 MG tablet Commonly known as: ZOFRAN Take 1 tablet (4 mg total) by mouth every 6 (six) hours as needed for nausea.   pantoprazole 40 MG tablet Commonly known as: PROTONIX TAKE 1 TABLET (40 MG TOTAL) BY MOUTH 2 (TWO) TIMES DAILY. OFFICE VISIT FOR FURTHER REFILLS    potassium chloride SA 20 MEQ tablet Commonly known as: KLOR-CON M Take 20 mEq by mouth 2 (two) times daily.   prednisoLONE acetate 1 % ophthalmic suspension Commonly known as: PRED FORTE PLEASE SEE ATTACHED FOR DETAILED DIRECTIONS   rosuvastatin 40 MG tablet Commonly known as: CRESTOR Take 1 tablet (40 mg total) by mouth daily.               Durable Medical Equipment  (From admission, onward)           Start     Ordered   11/20/22 1523  For home use only DME Walker rolling  Once       Question Answer Comment  Walker: With 5 Inch Wheels   Patient needs a walker to treat with the following condition Difficulty in walking, not elsewhere classified      11/20/22 1523            Contact information for after-discharge care     Destination     [facility] .   Service: Skilled Nursing Contact information: [address] [place] [address] [phone]                    Discharge Exam: Ceasar Mons Weights   11/18/22 0816 11/18/22 0831  Weight: 44.9 kg 44.9 kg   Vitals:   11/27/22 0434 11/27/22 0924  BP: (!) 144/84 (!) 121/93  Pulse: 65 62  Resp: 18   Temp: (!) 97.5 F (36.4 C)   SpO2: 98%    Examination: Physical Exam:  Constitutional: Elderly chronically ill-appearing Caucasian female in no acute distress Respiratory: Diminished to auscultation bilaterally, no wheezing, rales, rhonchi or crackles. Normal respiratory effort and patient is not tachypenic. No accessory muscle use.  Unlabored breathing Cardiovascular: RRR, no murmurs / rubs / gallops. S1 and S2 auscultated. No extremity edema. 2+ pedal pulses. No carotid bruits.  Abdomen: Soft, non-tender, non-distended. Bowel sounds positive.  GU: Deferred. Musculoskeletal: No clubbing / cyanosis of digits/nails. No joint deformity upper and lower extremities.  Skin: No rashes, lesions, ulcers on a limited skin evaluation. No induration; Warm and dry.   Neurologic: CN 2-12 grossly intact with no focal deficits.  Has a tremor on the left arm and slight on the right side of the face.  Romberg sign cerebellar reflexes not assessed.  Psychiatric: Normal judgment and insight. Alert and oriented x 3. Normal mood and appropriate affect.   Condition at discharge: stable  The results of significant  diagnostics from this hospitalization (including imaging, microbiology, ancillary and laboratory) are listed below for reference.   Imaging Studies: EEG adult  Result Date: 18-Dec-2022 Charlsie Quest, MD     2022-12-18  6:41 PM Patient Name: LIISA THURSTON MRN: [id number] Epilepsy Attending: Charlsie Quest Referring Physician/Provider: Merlene Laughter, DO Date: Dec 18, 2022 Duration: 24.08 mins Patient history: 77 yo F with right-sided weakness and tremulousness that is been going on for a few days. EEG to evaluate for seizure Level of alertness: Awake, asleep AEDs during EEG study: Xanax Technical aspects: This EEG study was done with scalp electrodes positioned according to the 10-20 International system of electrode placement. Electrical activity was reviewed with band pass filter of 1-70Hz , sensitivity of 7 uV/mm, display speed of 34mm/sec with a 60Hz  notched filter applied as appropriate. EEG data were recorded continuously and digitally stored.  Video monitoring was available and reviewed as appropriate. Description: The posterior dominant rhythm consists of 8 Hz activity of moderate voltage (25-35 uV) seen predominantly in posterior head regions, symmetric and reactive to eye opening and eye closing. Sleep was characterized by vertex waves, sleep spindles (12 to 14 Hz), maximal frontocentral region. There is an excessive amount of 15 to 18 Hz beta activity distributed symmetrically and diffusely. EEG also showed intermittent generalized 5 to 7 Hz theta slowing. Hyperventilation and photic stimulation were not performed.   ABNORMALITY - Intermittent slow,  generalized - Excessive beta, generalized IMPRESSION: This study is suggestive of mild diffuse encephalopathy. The excessive beta activity seen in the background is most likely due to the effect of benzodiazepine and is a benign EEG pattern. No seizures or epileptiform discharges were seen throughout the recording. Charlsie Quest   MR BRAIN WO CONTRAST  Result Date: 18-Dec-2022 CLINICAL DATA:  Initial evaluation for neuro deficit, stroke suspected. EXAM: MRI HEAD WITHOUT CONTRAST TECHNIQUE: Multiplanar, multiecho pulse sequences of the brain and surrounding structures were obtained without intravenous contrast. COMPARISON:  Prior CT from 11/23/2022 and MRI from 12/05/2013. FINDINGS: Brain: Diffuse prominence of the CSF containing spaces compatible generalized age-related cerebral atrophy. Patchy T2/FLAIR hyperintensity involving the periventricular and deep white matter both cerebral hemispheres, consistent with chronic small vessel ischemic disease, moderately advanced in nature. Few small remote lacunar infarcts present about the bilateral basal ganglia, thalami, midbrain, and pons. Few tiny remote cerebellar infarcts noted. 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 No evidence for acute or subacute ischemia. Gray-white matter differentiation maintained. No acute intracranial hemorrhage. No significant acute or chronic intracranial blood products. 8 mm extra-axial mass overlying the left frontal convexity, consistent with a small meningioma (series 17, image 20). No associated edema or mass effect. No other mass lesion. No midline shift or hydrocephalus. No extra-axial fluid collection. Pituitary gland and suprasellar region within normal limits. Vascular: Major intracranial vascular flow voids are maintained. Skull and upper cervical spine: Craniocervical junction within normal limits. Bone marrow signal  intensity normal. No scalp soft tissue abnormality. Sinuses/Orbits: Prior ocular lens replacement on the left. Paranasal sinuses are clear. Trace left with small right mastoid effusions, of doubtful significance. Visualized nasopharynx unremarkable. Other: None. IMPRESSION: 1. No acute intracranial abnormality. 2. Age-related cerebral atrophy with moderate chronic microvascular ischemic disease, with a few small remote lacunar infarcts involving the bilateral basal ganglia, thalami, midbrain, and pons. 3. 8 mm meningioma overlying the left frontal convexity without mass effect or edema. 4. 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 Electronically Signed   By: Rise Mu M.D.   On: 11/26/2022 05:07   CT HEAD WO CONTRAST ( )  Result Date: 11/23/2022 CLINICAL DATA:  Transient ischemic attack (TIA) RUE weakness/discordination EXAM: CT HEAD WITHOUT CONTRAST TECHNIQUE: Contiguous axial images were obtained from the base of the skull through the vertex without intravenous contrast. RADIATION DOSE REDUCTION: This exam was performed according to the departmental dose-optimization program which includes automated exposure control, adjustment of the mA and/or kV according to patient size and/or use of iterative reconstruction technique. COMPARISON:  CT head November 18, 2022. FINDINGS: Brain: No evidence of acute large vascular territory infarct, acute hemorrhage, mass lesion, midline shift or hydrocephalus. Chronic left perisylvian polymicrogyria. Small remote pontine infarct. Patchy additional white matter hypodensities are nonspecific but compatible with chronic microvascular ischemic disease. Vascular: No hyperdense vessel identified. Skull: No acute fracture. Sinuses/Orbits: Clear sinuses.  No acute orbital findings. Other: No mastoid effusions. IMPRESSION: 1. No evidence of acute intracranial abnormality. 2. Chronic left  perisylvian polymicrogyria. 3. Chronic microvascular ischemic change. Electronically Signed   By: Feliberto Harts M.D.   On: 11/23/2022 12:35   DG ESOPHAGUS W SINGLE CM (SOL OR THIN BA)  Result Date: 11/19/2022 CLINICAL DATA:  77 year old inpatient with dysphagia. Bedside evaluation by speech pathology demonstrated esophageal dysphagia. History of remote esophageal dilatation (2007). EXAM: ESOPHOGRAM/BARIUM SWALLOW TECHNIQUE: Single contrast examination was performed using  thin barium. FLUOROSCOPY: Radiation Exposure Index (as provided by the fluoroscopic device): 10.2 mGy Kerma COMPARISON:  Chest radiographs 11/18/2022.  Chest CT 02/19/2019. FINDINGS: Patient has limited mobility and limited ability to bear weight. A limited single contrast examination was performed in the semi erect, supine and right lateral decubitus positions. The patient swallowed the barium without difficulty. No laryngeal penetration or aspiration was observed during the examination. There is mild esophageal dysmotility with a mildly decreased primary stripping wave. There is smooth narrowing of the distal esophageal lumen without evidence of esophageal mass or significant hiatal hernia. This prevented the passage of a 13 mm barium tablet which was retained for 3-4 minutes despite multiple swallows of water and thin barium. Previous median sternotomy and prominent mitral annular calcifications are noted. IMPRESSION: 1. Limited single contrast examination due to patient mobility and limited ability to bear weight. 2. Mild esophageal dysmotility. 3. Smooth mild narrowing of the distal esophageal lumen preventing the passage of a 13 mm barium tablet, suspicious for a mild recurrent stricture. No mucosal irregularity identified. Electronically Signed   By: Carey Bullocks M.D.   On: 11/19/2022 12:29   DG Chest 2 View  Result Date: 11/18/2022 CLINICAL DATA:  Generalized weakness and fatigue. EXAM: CHEST - 2 VIEW COMPARISON:  Chest  radiograph 09/15/2021. FINDINGS: Unchanged mild elevation of the right hemidiaphragm. Annular calcifications of the mitral valve. Postoperative changes of median sternotomy and CABG. Clear lungs. No pleural effusion or pneumothorax. IMPRESSION: No evidence of acute cardiopulmonary disease. Electronically Signed   By: Orvan Falconer M.D.   On: 11/18/2022 09:52   CT Head Wo Contrast  Result Date: 11/18/2022 CLINICAL DATA:  Mental status change, unknown cause. Difficulty walking and difficulty swallowing. EXAM: CT HEAD WITHOUT CONTRAST TECHNIQUE: Contiguous axial images were obtained from the base of the skull through the vertex without intravenous contrast. RADIATION DOSE REDUCTION: This exam was performed according to the departmental dose-optimization program which includes automated exposure control, adjustment of the mA and/or kV according to patient size and/or use of iterative reconstruction technique. COMPARISON:  Head CT 11/15/2022. FINDINGS:  Brain: No acute hemorrhage. Unchanged moderate chronic small-vessel disease. Cortical gray-white differentiation is preserved. Unchanged findings of left perisylvian polymicrogyria. No hydrocephalus or extra-axial collection. No mass effect or midline shift. Vascular: No hyperdense vessel or unexpected calcification. Skull: No calvarial fracture or suspicious bone lesion. Skull base is unremarkable. Sinuses/Orbits: No acute finding. Other: None. IMPRESSION: 1. No acute intracranial abnormality. 2. Unchanged moderate chronic small-vessel disease. 3. Unchanged findings of left perisylvian polymicrogyria. Electronically Signed   By: Orvan Falconer M.D.   On: 11/18/2022 09:50   CT ABDOMEN PELVIS W CONTRAST  Result Date: 11/15/2022 CLINICAL DATA:  Weight loss, unintended, leg numbness and generalized weakness. EXAM: CT ABDOMEN AND PELVIS WITH CONTRAST TECHNIQUE: Multidetector CT imaging of the abdomen and pelvis was performed using the standard protocol following  bolus administration of intravenous contrast. RADIATION DOSE REDUCTION: This exam was performed according to the departmental dose-optimization program which includes automated exposure control, adjustment of the mA and/or kV according to patient size and/or use of iterative reconstruction technique. CONTRAST:  50mL OMNIPAQUE IOHEXOL 350 MG/ML SOLN COMPARISON:  CT urogram 02/08/2014 FINDINGS: Lower chest: Respiratory motion obscures detail. No acute abnormality. Hepatobiliary: Ill-defined hypoattenuation within the posteromedial right hepatic lobe favored due to perfusion anomalies. Underlying mass is difficult to exclude. Gallbladder and biliary tree are unremarkable. Pancreas: Fatty atrophy.  No acute abnormality. Spleen: Unremarkable. Adrenals/Urinary Tract: Stable adrenal glands. Bilateral cortical renal scarring. Nonobstructing renal calculi in the right kidney. No hydronephrosis. Nondistended thick-walled bladder. Stomach/Bowel: Stomach is within normal limits. No obstruction or bowel wall thickening. Normal appendix. Vascular/Lymphatic: Aortic atherosclerosis. No enlarged abdominal or pelvic lymph nodes. Reproductive: Uterus and bilateral adnexa are unremarkable. Other: No free intraperitoneal fluid or air. Musculoskeletal: No acute fracture or destructive osseous lesion. IMPRESSION: 1. No acute abnormality in the abdomen or pelvis. 2. Ill-defined hypoattenuation within the posteromedial right hepatic lobe favored due to perfusion anomalies. Underlying mass is difficult to exclude. Recommend further evaluation with nonemergent MRI abdomen with and without contrast. 3. Nonobstructing right nephrolithiasis. 4. Bladder wall thickening may be due to underdistention or cystitis. Aortic Atherosclerosis (ICD10-I70.0). Electronically Signed   By: Minerva Fester M.D.   On: 11/15/2022 19:28   CT Head Wo Contrast  Result Date: 11/15/2022 CLINICAL DATA:  Mental status change, unknown cause. Ongoing leg numbness and  generalized weakness. EXAM: CT HEAD WITHOUT CONTRAST TECHNIQUE: Contiguous axial images were obtained from the base of the skull through the vertex without intravenous contrast. RADIATION DOSE REDUCTION: This exam was performed according to the departmental dose-optimization program which includes automated exposure control, adjustment of the mA and/or kV according to patient size and/or use of iterative reconstruction technique. COMPARISON:  Head CT 09/13/2021.  Brain MRI 12/05/2013. FINDINGS: Brain: No acute hemorrhage. Unchanged patchy hypoattenuation of the cerebral white matter, most consistent with moderate chronic small-vessel disease. Cortical gray-white differentiation is preserved. Unchanged findings of left perisylvian polymicrogyria. No hydrocephalus or extra-axial collection. No mass effect or midline shift. Vascular: No hyperdense vessel or unexpected calcification. Skull: No calvarial fracture or suspicious bone lesion. Skull base is unremarkable. Sinuses/Orbits: No acute finding. Other: None. IMPRESSION: 1. No acute intracranial abnormality. 2. Unchanged findings of left perisylvian polymicrogyria. 3. Unchanged moderate chronic small-vessel disease. Electronically Signed   By: Orvan Falconer M.D.   On: 11/15/2022 18:46    Microbiology: Results for orders placed or performed during the hospital encounter of 11/18/22  Urine Culture     Status: None   Collection Time: 11/18/22  8:52 AM   Specimen: Urine,  Clean Catch  Result Value Ref Range Status   Specimen Description   Final    URINE, CLEAN CATCH Performed at [facility], [address] [place], [place] [address]    Special Requests   Final    NONE Performed at [facility], [address] [place], [place] [address]    Culture   Final    NO GROWTH Performed at [facility], [address] [place], [place] [address]    Report Status 11/19/2022 FINAL  Final   *Note: Due to a large  number of results and/or encounters for the requested time period, some results have not been displayed. A complete set of results can be found in Results Review.   Labs: CBC: Recent Labs  Lab 11/22/22 0516 11/23/22 0505 11/24/22 0516 11/25/22 0521 11/26/22 0512  WBC 13.4* 9.5 6.9 5.4 6.9  NEUTROABS  --  5.6  --  2.2 2.8  HGB 12.2 11.7* 11.0* 12.2 12.0  HCT 38.0 38.3 35.8* 39.0 38.6  MCV 88.0 91.2 91.1 90.5 90.6  PLT 253 213 231 283 334   Basic Metabolic Panel: Recent Labs  Lab 11/22/22 0516 11/23/22 0505 11/24/22 0515 11/24/22 0516 11/25/22 0521 11/26/22 0512  NA 132* 134*  --  133* 136 134*  K 3.6 3.6  --  3.8 4.1 4.2  CL 102 105  --  104 104 102  CO2 22 20*  --  21* 24 25  GLUCOSE 107* 91  --  94 102* 105*  BUN 16 19  --  22 19 25*  CREATININE 1.32* 1.26*  --  1.29* 1.47* 1.49*  CALCIUM 7.2* 7.6*  --  7.9* 8.5* 8.9  MG 2.4 1.8 1.6*  --  1.9 1.6*  PHOS  --   --  3.0  --  3.9 4.1   Liver Function Tests: Recent Labs  Lab 11/24/22 0515 11/25/22 0521 11/26/22 0512  AST 35 64* 74*  ALT 23 36 44  ALKPHOS 65 75 81  BILITOT 0.5 0.4 0.5  PROT 5.5* 6.0* 6.1*  ALBUMIN 2.5* 2.7* 2.7*   CBG: No results for input(s): "GLUCAP" in the last 168 hours.  Discharge time spent: greater than 30 minutes.  Signed: Marguerita Merles, DO [facility] 11/27/2022

## 2022-11-29 ENCOUNTER — Ambulatory Visit: Payer: No Typology Code available for payment source | Admitting: Physician Assistant

## 2022-11-29 DIAGNOSIS — R5381 Other malaise: Secondary | ICD-10-CM | POA: Diagnosis not present

## 2022-11-29 DIAGNOSIS — N390 Urinary tract infection, site not specified: Secondary | ICD-10-CM | POA: Diagnosis not present

## 2022-11-29 DIAGNOSIS — N183 Chronic kidney disease, stage 3 unspecified: Secondary | ICD-10-CM | POA: Diagnosis not present

## 2022-12-02 DIAGNOSIS — F32A Depression, unspecified: Secondary | ICD-10-CM | POA: Diagnosis not present

## 2022-12-02 DIAGNOSIS — F411 Generalized anxiety disorder: Secondary | ICD-10-CM | POA: Diagnosis not present

## 2022-12-02 DIAGNOSIS — G8323 Monoplegia of upper limb affecting right nondominant side: Secondary | ICD-10-CM | POA: Diagnosis not present

## 2022-12-02 DIAGNOSIS — R41841 Cognitive communication deficit: Secondary | ICD-10-CM | POA: Diagnosis not present

## 2022-12-02 DIAGNOSIS — N390 Urinary tract infection, site not specified: Secondary | ICD-10-CM | POA: Diagnosis not present

## 2022-12-02 DIAGNOSIS — M6289 Other specified disorders of muscle: Secondary | ICD-10-CM | POA: Diagnosis not present

## 2022-12-02 DIAGNOSIS — R2689 Other abnormalities of gait and mobility: Secondary | ICD-10-CM | POA: Diagnosis not present

## 2022-12-02 DIAGNOSIS — E039 Hypothyroidism, unspecified: Secondary | ICD-10-CM | POA: Diagnosis not present

## 2022-12-02 DIAGNOSIS — K219 Gastro-esophageal reflux disease without esophagitis: Secondary | ICD-10-CM | POA: Diagnosis not present

## 2022-12-02 DIAGNOSIS — M6281 Muscle weakness (generalized): Secondary | ICD-10-CM | POA: Diagnosis not present

## 2022-12-02 DIAGNOSIS — R471 Dysarthria and anarthria: Secondary | ICD-10-CM | POA: Diagnosis not present

## 2022-12-03 DIAGNOSIS — A000 Cholera due to Vibrio cholerae 01, biovar cholerae: Secondary | ICD-10-CM | POA: Diagnosis not present

## 2022-12-03 DIAGNOSIS — I10 Essential (primary) hypertension: Secondary | ICD-10-CM | POA: Diagnosis not present

## 2022-12-03 DIAGNOSIS — E790 Hyperuricemia without signs of inflammatory arthritis and tophaceous disease: Secondary | ICD-10-CM | POA: Diagnosis not present

## 2022-12-07 DIAGNOSIS — R5381 Other malaise: Secondary | ICD-10-CM | POA: Diagnosis not present

## 2022-12-07 DIAGNOSIS — R531 Weakness: Secondary | ICD-10-CM | POA: Diagnosis not present

## 2022-12-07 DIAGNOSIS — R600 Localized edema: Secondary | ICD-10-CM | POA: Diagnosis not present

## 2022-12-08 DIAGNOSIS — I10 Essential (primary) hypertension: Secondary | ICD-10-CM | POA: Diagnosis not present

## 2022-12-10 DIAGNOSIS — I10 Essential (primary) hypertension: Secondary | ICD-10-CM | POA: Diagnosis not present

## 2022-12-12 DIAGNOSIS — R2689 Other abnormalities of gait and mobility: Secondary | ICD-10-CM | POA: Diagnosis not present

## 2022-12-12 DIAGNOSIS — R278 Other lack of coordination: Secondary | ICD-10-CM | POA: Diagnosis not present

## 2022-12-12 DIAGNOSIS — R471 Dysarthria and anarthria: Secondary | ICD-10-CM | POA: Diagnosis not present

## 2022-12-12 DIAGNOSIS — N390 Urinary tract infection, site not specified: Secondary | ICD-10-CM | POA: Diagnosis not present

## 2022-12-12 DIAGNOSIS — R1319 Other dysphagia: Secondary | ICD-10-CM | POA: Diagnosis not present

## 2022-12-12 DIAGNOSIS — G8323 Monoplegia of upper limb affecting right nondominant side: Secondary | ICD-10-CM | POA: Diagnosis not present

## 2022-12-12 DIAGNOSIS — M6281 Muscle weakness (generalized): Secondary | ICD-10-CM | POA: Diagnosis not present

## 2022-12-12 DIAGNOSIS — R41841 Cognitive communication deficit: Secondary | ICD-10-CM | POA: Diagnosis not present

## 2022-12-12 DIAGNOSIS — M6289 Other specified disorders of muscle: Secondary | ICD-10-CM | POA: Diagnosis not present

## 2022-12-12 DIAGNOSIS — E039 Hypothyroidism, unspecified: Secondary | ICD-10-CM | POA: Diagnosis not present

## 2022-12-14 DIAGNOSIS — E039 Hypothyroidism, unspecified: Secondary | ICD-10-CM | POA: Diagnosis not present

## 2022-12-14 DIAGNOSIS — R41841 Cognitive communication deficit: Secondary | ICD-10-CM | POA: Diagnosis not present

## 2022-12-14 DIAGNOSIS — R5381 Other malaise: Secondary | ICD-10-CM | POA: Diagnosis not present

## 2022-12-14 DIAGNOSIS — I10 Essential (primary) hypertension: Secondary | ICD-10-CM | POA: Diagnosis not present

## 2022-12-14 DIAGNOSIS — R531 Weakness: Secondary | ICD-10-CM | POA: Diagnosis not present

## 2022-12-15 DIAGNOSIS — E039 Hypothyroidism, unspecified: Secondary | ICD-10-CM | POA: Diagnosis not present

## 2022-12-15 DIAGNOSIS — K219 Gastro-esophageal reflux disease without esophagitis: Secondary | ICD-10-CM | POA: Diagnosis not present

## 2022-12-15 DIAGNOSIS — R600 Localized edema: Secondary | ICD-10-CM | POA: Diagnosis not present

## 2022-12-15 DIAGNOSIS — F32A Depression, unspecified: Secondary | ICD-10-CM | POA: Diagnosis not present

## 2022-12-15 DIAGNOSIS — I10 Essential (primary) hypertension: Secondary | ICD-10-CM | POA: Diagnosis not present

## 2022-12-15 DIAGNOSIS — R531 Weakness: Secondary | ICD-10-CM | POA: Diagnosis not present

## 2022-12-15 DIAGNOSIS — G8323 Monoplegia of upper limb affecting right nondominant side: Secondary | ICD-10-CM | POA: Diagnosis not present

## 2022-12-15 DIAGNOSIS — R5381 Other malaise: Secondary | ICD-10-CM | POA: Diagnosis not present

## 2022-12-15 DIAGNOSIS — R471 Dysarthria and anarthria: Secondary | ICD-10-CM | POA: Diagnosis not present

## 2022-12-15 DIAGNOSIS — F411 Generalized anxiety disorder: Secondary | ICD-10-CM | POA: Diagnosis not present

## 2022-12-15 DIAGNOSIS — R41841 Cognitive communication deficit: Secondary | ICD-10-CM | POA: Diagnosis not present

## 2022-12-22 DIAGNOSIS — I129 Hypertensive chronic kidney disease with stage 1 through stage 4 chronic kidney disease, or unspecified chronic kidney disease: Secondary | ICD-10-CM | POA: Diagnosis not present

## 2022-12-22 DIAGNOSIS — M109 Gout, unspecified: Secondary | ICD-10-CM | POA: Diagnosis not present

## 2022-12-22 DIAGNOSIS — Z955 Presence of coronary angioplasty implant and graft: Secondary | ICD-10-CM | POA: Diagnosis not present

## 2022-12-22 DIAGNOSIS — G8323 Monoplegia of upper limb affecting right nondominant side: Secondary | ICD-10-CM | POA: Diagnosis not present

## 2022-12-22 DIAGNOSIS — F32A Depression, unspecified: Secondary | ICD-10-CM | POA: Diagnosis not present

## 2022-12-22 DIAGNOSIS — N183 Chronic kidney disease, stage 3 unspecified: Secondary | ICD-10-CM | POA: Diagnosis not present

## 2022-12-22 DIAGNOSIS — E669 Obesity, unspecified: Secondary | ICD-10-CM | POA: Diagnosis not present

## 2022-12-22 DIAGNOSIS — K219 Gastro-esophageal reflux disease without esophagitis: Secondary | ICD-10-CM | POA: Diagnosis not present

## 2022-12-22 DIAGNOSIS — E785 Hyperlipidemia, unspecified: Secondary | ICD-10-CM | POA: Diagnosis not present

## 2022-12-22 DIAGNOSIS — I350 Nonrheumatic aortic (valve) stenosis: Secondary | ICD-10-CM | POA: Diagnosis not present

## 2022-12-22 DIAGNOSIS — R1310 Dysphagia, unspecified: Secondary | ICD-10-CM | POA: Diagnosis not present

## 2022-12-22 DIAGNOSIS — E039 Hypothyroidism, unspecified: Secondary | ICD-10-CM | POA: Diagnosis not present

## 2022-12-22 DIAGNOSIS — G40109 Localization-related (focal) (partial) symptomatic epilepsy and epileptic syndromes with simple partial seizures, not intractable, without status epilepticus: Secondary | ICD-10-CM | POA: Diagnosis not present

## 2022-12-22 DIAGNOSIS — Z86718 Personal history of other venous thrombosis and embolism: Secondary | ICD-10-CM | POA: Diagnosis not present

## 2022-12-22 DIAGNOSIS — E876 Hypokalemia: Secondary | ICD-10-CM | POA: Diagnosis not present

## 2022-12-22 DIAGNOSIS — Z951 Presence of aortocoronary bypass graft: Secondary | ICD-10-CM | POA: Diagnosis not present

## 2022-12-22 DIAGNOSIS — Z87891 Personal history of nicotine dependence: Secondary | ICD-10-CM | POA: Diagnosis not present

## 2022-12-22 DIAGNOSIS — I471 Supraventricular tachycardia, unspecified: Secondary | ICD-10-CM | POA: Diagnosis not present

## 2022-12-22 DIAGNOSIS — I2510 Atherosclerotic heart disease of native coronary artery without angina pectoris: Secondary | ICD-10-CM | POA: Diagnosis not present

## 2022-12-22 DIAGNOSIS — M47812 Spondylosis without myelopathy or radiculopathy, cervical region: Secondary | ICD-10-CM | POA: Diagnosis not present

## 2022-12-22 DIAGNOSIS — Z7982 Long term (current) use of aspirin: Secondary | ICD-10-CM | POA: Diagnosis not present

## 2022-12-22 DIAGNOSIS — F411 Generalized anxiety disorder: Secondary | ICD-10-CM | POA: Diagnosis not present

## 2022-12-22 DIAGNOSIS — E559 Vitamin D deficiency, unspecified: Secondary | ICD-10-CM | POA: Diagnosis not present

## 2022-12-28 DIAGNOSIS — E559 Vitamin D deficiency, unspecified: Secondary | ICD-10-CM | POA: Diagnosis not present

## 2022-12-28 DIAGNOSIS — Z86718 Personal history of other venous thrombosis and embolism: Secondary | ICD-10-CM | POA: Diagnosis not present

## 2022-12-28 DIAGNOSIS — I350 Nonrheumatic aortic (valve) stenosis: Secondary | ICD-10-CM | POA: Diagnosis not present

## 2022-12-28 DIAGNOSIS — Z951 Presence of aortocoronary bypass graft: Secondary | ICD-10-CM | POA: Diagnosis not present

## 2022-12-28 DIAGNOSIS — E039 Hypothyroidism, unspecified: Secondary | ICD-10-CM | POA: Diagnosis not present

## 2022-12-28 DIAGNOSIS — Z87891 Personal history of nicotine dependence: Secondary | ICD-10-CM | POA: Diagnosis not present

## 2022-12-28 DIAGNOSIS — Z7982 Long term (current) use of aspirin: Secondary | ICD-10-CM | POA: Diagnosis not present

## 2022-12-28 DIAGNOSIS — I129 Hypertensive chronic kidney disease with stage 1 through stage 4 chronic kidney disease, or unspecified chronic kidney disease: Secondary | ICD-10-CM | POA: Diagnosis not present

## 2022-12-28 DIAGNOSIS — Z955 Presence of coronary angioplasty implant and graft: Secondary | ICD-10-CM | POA: Diagnosis not present

## 2022-12-28 DIAGNOSIS — M109 Gout, unspecified: Secondary | ICD-10-CM | POA: Diagnosis not present

## 2022-12-28 DIAGNOSIS — E669 Obesity, unspecified: Secondary | ICD-10-CM | POA: Diagnosis not present

## 2022-12-28 DIAGNOSIS — E876 Hypokalemia: Secondary | ICD-10-CM | POA: Diagnosis not present

## 2022-12-29 DIAGNOSIS — Z7982 Long term (current) use of aspirin: Secondary | ICD-10-CM | POA: Diagnosis not present

## 2022-12-29 DIAGNOSIS — Z86718 Personal history of other venous thrombosis and embolism: Secondary | ICD-10-CM | POA: Diagnosis not present

## 2022-12-29 DIAGNOSIS — I350 Nonrheumatic aortic (valve) stenosis: Secondary | ICD-10-CM | POA: Diagnosis not present

## 2022-12-29 DIAGNOSIS — E669 Obesity, unspecified: Secondary | ICD-10-CM | POA: Diagnosis not present

## 2022-12-29 DIAGNOSIS — M109 Gout, unspecified: Secondary | ICD-10-CM | POA: Diagnosis not present

## 2022-12-29 DIAGNOSIS — Z951 Presence of aortocoronary bypass graft: Secondary | ICD-10-CM | POA: Diagnosis not present

## 2022-12-29 DIAGNOSIS — E876 Hypokalemia: Secondary | ICD-10-CM | POA: Diagnosis not present

## 2022-12-29 DIAGNOSIS — E559 Vitamin D deficiency, unspecified: Secondary | ICD-10-CM | POA: Diagnosis not present

## 2022-12-29 DIAGNOSIS — Z955 Presence of coronary angioplasty implant and graft: Secondary | ICD-10-CM | POA: Diagnosis not present

## 2022-12-29 DIAGNOSIS — E039 Hypothyroidism, unspecified: Secondary | ICD-10-CM | POA: Diagnosis not present

## 2022-12-29 DIAGNOSIS — Z87891 Personal history of nicotine dependence: Secondary | ICD-10-CM | POA: Diagnosis not present

## 2022-12-29 DIAGNOSIS — I129 Hypertensive chronic kidney disease with stage 1 through stage 4 chronic kidney disease, or unspecified chronic kidney disease: Secondary | ICD-10-CM | POA: Diagnosis not present

## 2022-12-31 ENCOUNTER — Other Ambulatory Visit: Payer: Self-pay | Admitting: Neurology

## 2022-12-31 DIAGNOSIS — I5032 Chronic diastolic (congestive) heart failure: Secondary | ICD-10-CM | POA: Diagnosis not present

## 2022-12-31 DIAGNOSIS — I130 Hypertensive heart and chronic kidney disease with heart failure and stage 1 through stage 4 chronic kidney disease, or unspecified chronic kidney disease: Secondary | ICD-10-CM | POA: Diagnosis not present

## 2022-12-31 DIAGNOSIS — N1832 Chronic kidney disease, stage 3b: Secondary | ICD-10-CM | POA: Diagnosis not present

## 2022-12-31 DIAGNOSIS — I700 Atherosclerosis of aorta: Secondary | ICD-10-CM | POA: Diagnosis not present

## 2022-12-31 DIAGNOSIS — Z87440 Personal history of urinary (tract) infections: Secondary | ICD-10-CM | POA: Diagnosis not present

## 2022-12-31 DIAGNOSIS — F411 Generalized anxiety disorder: Secondary | ICD-10-CM | POA: Diagnosis not present

## 2022-12-31 DIAGNOSIS — I10 Essential (primary) hypertension: Secondary | ICD-10-CM | POA: Diagnosis not present

## 2022-12-31 DIAGNOSIS — I2510 Atherosclerotic heart disease of native coronary artery without angina pectoris: Secondary | ICD-10-CM | POA: Diagnosis not present

## 2022-12-31 DIAGNOSIS — F331 Major depressive disorder, recurrent, moderate: Secondary | ICD-10-CM | POA: Diagnosis not present

## 2022-12-31 DIAGNOSIS — J479 Bronchiectasis, uncomplicated: Secondary | ICD-10-CM | POA: Diagnosis not present

## 2023-01-03 DIAGNOSIS — Z87891 Personal history of nicotine dependence: Secondary | ICD-10-CM | POA: Diagnosis not present

## 2023-01-03 DIAGNOSIS — E669 Obesity, unspecified: Secondary | ICD-10-CM | POA: Diagnosis not present

## 2023-01-03 DIAGNOSIS — I129 Hypertensive chronic kidney disease with stage 1 through stage 4 chronic kidney disease, or unspecified chronic kidney disease: Secondary | ICD-10-CM | POA: Diagnosis not present

## 2023-01-03 DIAGNOSIS — Z7982 Long term (current) use of aspirin: Secondary | ICD-10-CM | POA: Diagnosis not present

## 2023-01-03 DIAGNOSIS — E039 Hypothyroidism, unspecified: Secondary | ICD-10-CM | POA: Diagnosis not present

## 2023-01-03 DIAGNOSIS — E559 Vitamin D deficiency, unspecified: Secondary | ICD-10-CM | POA: Diagnosis not present

## 2023-01-03 DIAGNOSIS — Z86718 Personal history of other venous thrombosis and embolism: Secondary | ICD-10-CM | POA: Diagnosis not present

## 2023-01-03 DIAGNOSIS — Z955 Presence of coronary angioplasty implant and graft: Secondary | ICD-10-CM | POA: Diagnosis not present

## 2023-01-03 DIAGNOSIS — M109 Gout, unspecified: Secondary | ICD-10-CM | POA: Diagnosis not present

## 2023-01-03 DIAGNOSIS — I350 Nonrheumatic aortic (valve) stenosis: Secondary | ICD-10-CM | POA: Diagnosis not present

## 2023-01-03 DIAGNOSIS — E876 Hypokalemia: Secondary | ICD-10-CM | POA: Diagnosis not present

## 2023-01-03 DIAGNOSIS — Z951 Presence of aortocoronary bypass graft: Secondary | ICD-10-CM | POA: Diagnosis not present

## 2023-01-03 NOTE — Telephone Encounter (Signed)
Rx refilled per last office visit note.

## 2023-01-07 DIAGNOSIS — E669 Obesity, unspecified: Secondary | ICD-10-CM | POA: Diagnosis not present

## 2023-01-07 DIAGNOSIS — M109 Gout, unspecified: Secondary | ICD-10-CM | POA: Diagnosis not present

## 2023-01-07 DIAGNOSIS — E559 Vitamin D deficiency, unspecified: Secondary | ICD-10-CM | POA: Diagnosis not present

## 2023-01-07 DIAGNOSIS — I350 Nonrheumatic aortic (valve) stenosis: Secondary | ICD-10-CM | POA: Diagnosis not present

## 2023-01-07 DIAGNOSIS — Z87891 Personal history of nicotine dependence: Secondary | ICD-10-CM | POA: Diagnosis not present

## 2023-01-07 DIAGNOSIS — E039 Hypothyroidism, unspecified: Secondary | ICD-10-CM | POA: Diagnosis not present

## 2023-01-07 DIAGNOSIS — E876 Hypokalemia: Secondary | ICD-10-CM | POA: Diagnosis not present

## 2023-01-07 DIAGNOSIS — Z86718 Personal history of other venous thrombosis and embolism: Secondary | ICD-10-CM | POA: Diagnosis not present

## 2023-01-07 DIAGNOSIS — Z955 Presence of coronary angioplasty implant and graft: Secondary | ICD-10-CM | POA: Diagnosis not present

## 2023-01-07 DIAGNOSIS — I129 Hypertensive chronic kidney disease with stage 1 through stage 4 chronic kidney disease, or unspecified chronic kidney disease: Secondary | ICD-10-CM | POA: Diagnosis not present

## 2023-01-07 DIAGNOSIS — Z951 Presence of aortocoronary bypass graft: Secondary | ICD-10-CM | POA: Diagnosis not present

## 2023-01-07 DIAGNOSIS — Z7982 Long term (current) use of aspirin: Secondary | ICD-10-CM | POA: Diagnosis not present

## 2023-01-17 DIAGNOSIS — Z951 Presence of aortocoronary bypass graft: Secondary | ICD-10-CM | POA: Diagnosis not present

## 2023-01-17 DIAGNOSIS — E876 Hypokalemia: Secondary | ICD-10-CM | POA: Diagnosis not present

## 2023-01-17 DIAGNOSIS — E559 Vitamin D deficiency, unspecified: Secondary | ICD-10-CM | POA: Diagnosis not present

## 2023-01-17 DIAGNOSIS — I129 Hypertensive chronic kidney disease with stage 1 through stage 4 chronic kidney disease, or unspecified chronic kidney disease: Secondary | ICD-10-CM | POA: Diagnosis not present

## 2023-01-17 DIAGNOSIS — Z87891 Personal history of nicotine dependence: Secondary | ICD-10-CM | POA: Diagnosis not present

## 2023-01-17 DIAGNOSIS — E039 Hypothyroidism, unspecified: Secondary | ICD-10-CM | POA: Diagnosis not present

## 2023-01-17 DIAGNOSIS — Z86718 Personal history of other venous thrombosis and embolism: Secondary | ICD-10-CM | POA: Diagnosis not present

## 2023-01-17 DIAGNOSIS — I350 Nonrheumatic aortic (valve) stenosis: Secondary | ICD-10-CM | POA: Diagnosis not present

## 2023-01-17 DIAGNOSIS — E669 Obesity, unspecified: Secondary | ICD-10-CM | POA: Diagnosis not present

## 2023-01-17 DIAGNOSIS — Z7982 Long term (current) use of aspirin: Secondary | ICD-10-CM | POA: Diagnosis not present

## 2023-01-17 DIAGNOSIS — M109 Gout, unspecified: Secondary | ICD-10-CM | POA: Diagnosis not present

## 2023-01-17 DIAGNOSIS — Z955 Presence of coronary angioplasty implant and graft: Secondary | ICD-10-CM | POA: Diagnosis not present

## 2023-01-19 ENCOUNTER — Other Ambulatory Visit: Payer: Self-pay | Admitting: Cardiology

## 2023-01-19 DIAGNOSIS — I10 Essential (primary) hypertension: Secondary | ICD-10-CM

## 2023-01-24 ENCOUNTER — Encounter: Payer: Self-pay | Admitting: Internal Medicine

## 2023-01-24 ENCOUNTER — Ambulatory Visit: Payer: No Typology Code available for payment source | Admitting: Internal Medicine

## 2023-01-24 VITALS — BP 112/68 | HR 72 | Ht 59.0 in | Wt 91.0 lb

## 2023-01-24 DIAGNOSIS — R634 Abnormal weight loss: Secondary | ICD-10-CM

## 2023-01-24 DIAGNOSIS — K219 Gastro-esophageal reflux disease without esophagitis: Secondary | ICD-10-CM | POA: Diagnosis not present

## 2023-01-24 DIAGNOSIS — K222 Esophageal obstruction: Secondary | ICD-10-CM | POA: Diagnosis not present

## 2023-01-24 DIAGNOSIS — K224 Dyskinesia of esophagus: Secondary | ICD-10-CM

## 2023-01-24 DIAGNOSIS — R1310 Dysphagia, unspecified: Secondary | ICD-10-CM

## 2023-01-24 NOTE — Progress Notes (Signed)
 HISTORY OF PRESENT ILLNESS:  [name] is a 78 y.o. female with multiple significant medical problems as listed below.  She presents today's office with a chief complaint of pill dysphagia.  I last saw the patient March 2023.  Her husband Ubaldo passed away in 06/01/2021.  She is accompanied today by her niece.  Patient was hospitalized November 18, 2022 through November 26, 2022 with urinary tract infection.  She was complaining of dysphagia at that time.  Esophagram was performed November 19, 2022 and revealed smooth mild narrowing of the distal esophagus with nonpassage of 13 mm barium tablet suspicious for distal esophageal stricture.  She presents today.  She complains of pill dysphagia.  Does better when pills are crushed.  Less trouble with food.  Unclear how much she is eating.  She continues to lose weight.  She does take pantoprazole  for GERD.  No longer on Plavix  or oral anticoagulant.  Looks frail compared to her last visit  REVIEW OF SYSTEMS:  All non-GI ROS noncontributory  Past Medical History:  Diagnosis Date   Anxiety    Arthritis    Blood transfusion without reported diagnosis    childhood   Carpal tunnel syndrome, bilateral 07/31/2015   Cataract    Cervical spondylosis without myelopathy    Chronic chest pain    Chronic kidney disease    Chronic low back pain    Chronotropic incompetence    CKD (chronic kidney disease), stage III (HCC)    Coronary artery disease    a. inf-post MI 2011 s/p DES to RCA. b. DES to RCA 06/2010. c. DES to LAD 2014, residual dz treated medically.   Depression    DVT (deep venous thrombosis) (HCC)    left   Esophageal stricture    GERD (gastroesophageal reflux disease)    Gross hematuria    Heart murmur    History of esophageal dilatation    FOR STRIUCTURE   History of gout    History of hiatal hernia    History of kidney stones    Hyperlipidemia    Hypertension    Hypothyroidism    Internal hemorrhoids    Irritable bowel  syndrome    Mild aortic stenosis    by echo 2018   Myocardial infarction (HCC)    Obesity    Orthostatic hypotension    Partial seizure disorder (HCC) NEUROLOGIST-- DR JENEL   NOCTURNAL   PONV (postoperative nausea and vomiting)    hard time getting iv site-had to do neck stick 2 yrs ago   Pulmonary fibrosis (HCC)    S/P pericardial cyst excision    02-05-2013  benign   Seizures (HCC)    none in yrs   SVT (supraventricular tachycardia) (HCC)    Trigger finger, acquired 09/30/2015   Right middle finger   Vitamin D  deficiency    Weakness     Past Surgical History:  Procedure Laterality Date   BIOPSY OF MEDIASTINAL MASS N/A 02/05/2013   Procedure: RESECTION OF MEDIASTINAL MASS;  Surgeon: Maude Fleeta Ochoa, MD;  Location: [facility] OR;  Service: Thoracic;  Laterality: N/A;   CARDIAC CATHETERIZATION Right 01/24/2015   Procedure: right femoral ARTERIAL LINE INSERTION;  Surgeon: Maude Fleeta Ochoa, MD;  Location: [facility] OR;  Service: Thoracic;  Laterality: Right;   CARDIOVASCULAR STRESS TEST  11-22-2013  dr dann   normal lexiscan  study/  no ischemia/  normal LVF and wall motion/ ef 81%   COLONOSCOPY  last one 2014   CORONARY  ANGIOGRAM  03/10/2011   Procedure: CORONARY ANGIOGRAM;  Surgeon: Candyce GORMAN Reek, MD;  Location: [facility] CATH LAB;  Service: Cardiovascular;;   CORONARY ANGIOPLASTY WITH STENT PLACEMENT  11-22-2009  dr reek   Acute inferoposterior MI/  ef 60%,  PCI with DES x1 to dRCA,  25%  mLAD   CORONARY ANGIOPLASTY WITH STENT PLACEMENT  06-26-2010  dr victory sharps   Cutting balloon angioplasty to dRCA with DES x1,  40% mLAD (non-obstructive CAD)   CYSTOSCOPY WITH RETROGRADE PYELOGRAM, URETEROSCOPY AND STENT PLACEMENT Right 02/13/2014   Procedure: CYSTOSCOPY WITH RETROGRADE PYELOGRAM, URETEROSCOPY AND STENT PLACEMENT;  Surgeon: Alm GORMAN Fragmin, MD;  Location: [facility];  Service: Urology;  Laterality: Right;   ECTOPIC PREGNANCY SURGERY  YRS AGO   SALPINGECTOMY    ESOPHAGOGASTRODUODENOSCOPY N/A 02/15/2014   Procedure: ESOPHAGOGASTRODUODENOSCOPY (EGD);  Surgeon: Gordy CHRISTELLA Starch, MD;  Location: [facility];  Service: Endoscopy;  Laterality: N/A;   ESOPHAGOGASTRODUODENOSCOPY (EGD) WITH ESOPHAGEAL DILATION  05-20-2011   HOLMIUM LASER APPLICATION Right 02/13/2014   Procedure: HOLMIUM LASER APPLICATION;  Surgeon: Alm GORMAN Fragmin, MD;  Location: [facility];  Service: Urology;  Laterality: Right;   LEFT HEART CATH AND CORONARY ANGIOGRAPHY N/A 09/12/2019   Procedure: LEFT HEART CATH AND CORONARY ANGIOGRAPHY;  Surgeon: Reek Candyce GORMAN, MD;  Location: [facility] INVASIVE CV LAB;  Service: Cardiovascular;  Laterality: N/A;   LEFT HEART CATH AND CORONARY ANGIOGRAPHY N/A 09/16/2021   Procedure: LEFT HEART CATH AND CORONARY ANGIOGRAPHY;  Surgeon: Jordan, Peter M, MD;  Location: [facility] INVASIVE CV LAB;  Service: Cardiovascular;  Laterality: N/A;   LEFT HEART CATHETERIZATION WITH CORONARY ANGIOGRAM N/A 03/14/2012   Procedure: LEFT HEART CATHETERIZATION WITH CORONARY ANGIOGRAM;  Surgeon: Wilbert JONELLE Bihari, MD;  Location: [facility] CATH LAB;  Service: Cardiovascular;  Laterality: N/A;  Normal LM,  50% pLAD,  70% mLAD,  D2 50-70%, very tortuous LAD,  70-82% ostial LCFX,  50% in-stent restenosis of  dRCA and mRCA  stent and 50-70% ostial PDA,  normal LVSF, ef 60%   LUNG BIOPSY Right 01/24/2015   Procedure: RIGHT LUNG BIOPSY;  Surgeon: Maude Fleeta Ochoa, MD;  Location: [facility] OR;  Service: Thoracic;  Laterality: Right;   MEDIASTERNOTOMY N/A 02/05/2013   Procedure: MEDIAN STERNOTOMY;  Surgeon: Maude Fleeta Ochoa, MD;  Location: [facility] OR;  Service: Thoracic;  Laterality: N/A;   MEDIASTERNOTOMY Left 02/05/2013   NEEDLE GUIDED EXCISION BREAST CALCIFICATIONS Right 08-09-2008   PERCUTANEOUS CORONARY STENT INTERVENTION (PCI-S) N/A 04/03/2012   Procedure: PERCUTANEOUS CORONARY STENT INTERVENTION (PCI-S);  Surgeon: Candyce GORMAN Reek, MD;  Location: [facility] CATH LAB;  Service: Cardiovascular;  Laterality: N/A;    Successful PCI  mLAD with 2.75x12 Promus stent, postdilated to >0.45mm   THORACOTOMY Right 01/24/2015   Procedure: RIGHT MINI/LIMITED THORACOTOMY;  Surgeon: Maude Fleeta Ochoa, MD;  Location: [facility] OR;  Service: Thoracic;  Laterality: Right;   TRANSTHORACIC ECHOCARDIOGRAM  11-17-2011   grade I diastolic dysfunction/  ef 55-60%   VIDEO BRONCHOSCOPY N/A 01/24/2015   Procedure: RIGHT VIDEO BRONCHOSCOPY;  Surgeon: Maude Fleeta Ochoa, MD;  Location: [facility] OR;  Service: Thoracic;  Laterality: N/A;    Social History Timiya ASHIRA KIRSTEN  reports that she quit smoking about 55 years ago. Her smoking use included cigarettes. She started smoking about 58 years ago. She has a 0.9 pack-year smoking history. She has never used smokeless tobacco. She reports that she does not drink alcohol  and does not use drugs.  family history includes Breast cancer in her niece  and sister; CVA in her mother; Coronary artery disease in her father; Diabetes Mellitus I in her father; Emphysema in her father; Heart attack in her mother; Heart disease in her father; Hypertension in her father.  No Known Allergies     PHYSICAL EXAMINATION: Vital signs: BP 112/68   Pulse 72   Ht 4' 11 (1.499 m)   Wt 91 lb (41.3 kg)   BMI 18.38 kg/m   Constitutional: Thin, chronically ill-appearing, frail, no acute distress Psychiatric: Pleasant, alert and oriented x3, cooperative Eyes: extraocular movements intact, anicteric, conjunctiva pink Mouth: oral pharynx moist, no lesions Neck: supple no lymphadenopathy Cardiovascular: heart regular rate and rhythm, 3/6 systolic murmur Lungs: clear to auscultation bilaterally Abdomen: soft, nontender, nondistended, no obvious ascites, no peritoneal signs, normal bowel sounds, no organomegaly Rectal: Omitted Extremities: no clubbing, cyanosis, or lower extremity edema bilaterally Skin: no lesions on visible extremities Neuro: No focal deficits.  Cranial nerves intact  ASSESSMENT:  1.  Pill dysphagia 2.   Abnormal esophagram with distal stricture and delayed passage of tablet 3.  Chronic dysphagia secondary to both dysmotility and esophageal stricturing 4.  Multiple significant medical problems 5.  GERD  PLAN:  1.  Continue PPI 2.  Crush feels that that helped 3.  Schedule upper endoscopy with esophageal dilation.  The patient is high risk given her age and comorbidities.The nature of the procedure, as well as the risks, benefits, and alternatives were carefully and thoroughly reviewed with the patient. Ample time for discussion and questions allowed. The patient understood, was satisfied, and agreed to proceed. 4.  Ongoing general medical care with PCP A total time of 40 minutes was spent preparing to see the patient, obtaining interval history, performing medically appropriate physical exam, counseling and educating the patient and her niece regarding the above listed issues, ordering endoscopic therapeutic procedure, and documenting clinical information in the health record

## 2023-01-24 NOTE — Patient Instructions (Signed)
 You have been scheduled for an endoscopy. Please follow written instructions given to you at your visit today.  If you use inhalers (even only as needed), please bring them with you on the day of your procedure.  If you take any of the following medications, they will need to be adjusted prior to your procedure:   DO NOT TAKE 7 DAYS PRIOR TO TEST- Trulicity (dulaglutide) Ozempic, Wegovy (semaglutide) Mounjaro (tirzepatide) Bydureon Bcise (exanatide extended release)  DO NOT TAKE 1 DAY PRIOR TO YOUR TEST Rybelsus (semaglutide) Adlyxin (lixisenatide) Victoza (liraglutide) Byetta (exanatide) ___________________________________________________________________________   _______________________________________________________  If your blood pressure at your visit was 140/90 or greater, please contact your primary care physician to follow up on this.  _______________________________________________________  If you are age 88 or older, your body mass index should be between 23-30. Your Body mass index is 18.38 kg/m. If this is out of the aforementioned range listed, please consider follow up with your Primary Care Provider.  If you are age 13 or younger, your body mass index should be between 19-25. Your Body mass index is 18.38 kg/m. If this is out of the aformentioned range listed, please consider follow up with your Primary Care Provider.   ________________________________________________________  The [facility] providers would like to encourage you to use MYCHART to communicate with providers for non-urgent requests or questions.  Due to long hold times on the telephone, sending your provider a message by [facility] may be a faster and more efficient way to get a response.  Please allow 48 business hours for a response.  Please remember that this is for non-urgent requests.  _______________________________________________________

## 2023-01-26 DIAGNOSIS — Z951 Presence of aortocoronary bypass graft: Secondary | ICD-10-CM | POA: Diagnosis not present

## 2023-01-26 DIAGNOSIS — E559 Vitamin D deficiency, unspecified: Secondary | ICD-10-CM | POA: Diagnosis not present

## 2023-01-26 DIAGNOSIS — Z87891 Personal history of nicotine dependence: Secondary | ICD-10-CM | POA: Diagnosis not present

## 2023-01-26 DIAGNOSIS — Z955 Presence of coronary angioplasty implant and graft: Secondary | ICD-10-CM | POA: Diagnosis not present

## 2023-01-26 DIAGNOSIS — I350 Nonrheumatic aortic (valve) stenosis: Secondary | ICD-10-CM | POA: Diagnosis not present

## 2023-01-26 DIAGNOSIS — E039 Hypothyroidism, unspecified: Secondary | ICD-10-CM | POA: Diagnosis not present

## 2023-01-26 DIAGNOSIS — E876 Hypokalemia: Secondary | ICD-10-CM | POA: Diagnosis not present

## 2023-01-26 DIAGNOSIS — E669 Obesity, unspecified: Secondary | ICD-10-CM | POA: Diagnosis not present

## 2023-01-26 DIAGNOSIS — M109 Gout, unspecified: Secondary | ICD-10-CM | POA: Diagnosis not present

## 2023-01-26 DIAGNOSIS — I129 Hypertensive chronic kidney disease with stage 1 through stage 4 chronic kidney disease, or unspecified chronic kidney disease: Secondary | ICD-10-CM | POA: Diagnosis not present

## 2023-01-26 DIAGNOSIS — Z7982 Long term (current) use of aspirin: Secondary | ICD-10-CM | POA: Diagnosis not present

## 2023-01-26 DIAGNOSIS — Z86718 Personal history of other venous thrombosis and embolism: Secondary | ICD-10-CM | POA: Diagnosis not present

## 2023-02-03 ENCOUNTER — Ambulatory Visit: Payer: No Typology Code available for payment source | Admitting: Adult Health

## 2023-02-03 ENCOUNTER — Encounter: Payer: Self-pay | Admitting: Adult Health

## 2023-02-03 VITALS — BP 114/64 | HR 67 | Ht 59.0 in | Wt 91.8 lb

## 2023-02-03 DIAGNOSIS — R251 Tremor, unspecified: Secondary | ICD-10-CM | POA: Diagnosis not present

## 2023-02-03 DIAGNOSIS — G40109 Localization-related (focal) (partial) symptomatic epilepsy and epileptic syndromes with simple partial seizures, not intractable, without status epilepticus: Secondary | ICD-10-CM

## 2023-02-03 DIAGNOSIS — R531 Weakness: Secondary | ICD-10-CM | POA: Diagnosis not present

## 2023-02-03 NOTE — Progress Notes (Signed)
PATIENT: [name] DOB: 05-14-1945  REASON FOR VISIT: follow up HISTORY FROM: patient  Chief Complaint  Patient presents with   Follow-up    Patient in room #19 with her niece. Patient states the tremors in her face has gotten worse since the last visit and she been drooling a lot lately.      HISTORY OF PRESENT ILLNESS: Today 02/03/23:  [name] is a 78 y.o. female with a history of seizures. Returns today for follow-up. Continues to take Keppra 500 mg BID. Denies seizure events. Lives at home by herself. At home able to complete all ADLs independently. Not operating a motor vehicle.  She is here today with her niece.  Niece has questions about tremor in the jaw.  Looking through the notes it has been notated that she has a chronic tremor of the jaw.  However not seeing any additional workup or possible diagnosis.  Niece also has noticed some drooling.  Patient recently went to the hospital in November for bilateral leg weakness.  She did not have an acute stroke but there was chronic lacunar infarcts noted on the MRI.  Patient reports that she has had a right sided weakness since childhood.  In a previous note by Dr. Anne [name]  he noted intrinsic right hand weakness, in addition to trigger finger.  At that time the patient was referred to a hand surgeon but she deferred.  MRI brain 11/26/22:  IMPRESSION: 1. No acute intracranial abnormality. 2. Age-related cerebral atrophy with moderate chronic microvascular ischemic disease, with a few small remote lacunar infarcts involving the bilateral basal ganglia, thalami, midbrain, and pons. 3. 8 mm meningioma overlying the left frontal convexity without mass effect or edema. 4. 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   02/02/22: [name] is a 78 y.o. female with a history of seizures. Returns today for follow-up.  She remains on Keppra 500 mg  twice a day.  Denies any seizure-like events.  She does report that her husband passed away last 06-15-2023. Reports that she operates motor vehicle. Able to complete all ADLs independently. Has a niece that helps with her bills. And Neighbors help if she needs something.   02/02/21: [name] is a 78 year old female with a history of seizures.  She returns today for follow-up.  She denies any seizure events.  Continues on Keppra 500 mg twice a day.  She operates a motor vehicle on rare occasions.  Reports most the time her husband drives.  Denies any new symptoms.  Returns today for an evaluation.  HISTORY  10/16/19 [name] is a 78 year old female with history of seizures.  She is on Keppra 500 mg twice a day.  Her seizures are nocturnally. Knows a seizure has happened because she will wake up and the right side of her face will be drawn up and numb.  No recurrent seizure in the last year.  Tolerating medicine well.  She has a chronic tremor to her jaw.  She had a recent heart catheterization, reportedly no blockages.  She lives with her husband, does not drive much.  No recent falls, ambulates with assistance.  Presents today for evaluation unaccompanied.  REVIEW OF SYSTEMS: Out of a complete 14 system review of symptoms, the patient complains only of the following symptoms, and all other reviewed systems are negative.  ALLERGIES: No Known Allergies  HOME MEDICATIONS: Outpatient Medications Prior to Visit  Medication Sig Dispense  Refill   acetaminophen (TYLENOL) 325 MG tablet Take 2 tablets (650 mg total) by mouth every 6 (six) hours as needed for mild pain (pain score 1-3) (or Fever >/= 101).     ALPRAZolam (XANAX) 0.25 MG tablet Take 1 tablet (0.25 mg total) by mouth 2 (two) times daily. 10 tablet 0   ARIPiprazole (ABILIFY) 2 MG tablet Take 2 mg by mouth daily.     aspirin EC 81 MG tablet Take 81 mg by mouth daily.     calcitRIOL (ROCALTROL) 0.25 MCG capsule Take 0.25 mcg by mouth daily.      [employer] D3 50 MCG (2000 UT) CAPS Take 2,000 Units by mouth daily.     cyanocobalamin 1000 MCG tablet Take 1 tablet (1,000 mcg total) by mouth daily. 30 tablet 0   diltiazem (CARDIZEM LA) 180 MG 24 hr tablet Take 1 tablet (180 mg total) by mouth daily. 90 tablet 3   DULoxetine (CYMBALTA) 60 MG capsule Take 30 mg by mouth daily.     HYDROcodone-acetaminophen (NORCO) 10-325 MG tablet Take 1 tablet by mouth every 6 (six) hours as needed for moderate pain (pain score 4-6) or severe pain (pain score 7-10). 10 tablet 0   levETIRAcetam (KEPPRA) 500 MG tablet TAKE 1 TABLET BY MOUTH TWICE A DAY 180 tablet 0   levothyroxine (SYNTHROID) 100 MCG tablet Take 88 mcg by mouth every morning.     losartan (COZAAR) 25 MG tablet Take 2 tablets (50 mg total) by mouth daily.     magnesium oxide (MAG-OX) 400 (240 Mg) MG tablet Take 400 mg by mouth 2 (two) times daily.     moxifloxacin (VIGAMOX) 0.5 % ophthalmic solution PLEASE SEE ATTACHED FOR DETAILED DIRECTIONS     nebivolol (BYSTOLIC) 5 MG tablet TAKE 0.5 TABLETS (2.5 MG TOTAL) BY MOUTH IN THE MORNING AND AT BEDTIME. 90 tablet 0   nitroGLYCERIN (NITROSTAT) 0.4 MG SL tablet PLACE 1 TABLET (0.4 MG TOTAL) UNDER THE TONGUE EVERY 5 (FIVE) MINUTES AS NEEDED FOR CHEST PAIN. 25 tablet 6   ondansetron (ZOFRAN) 4 MG tablet Take 1 tablet (4 mg total) by mouth every 6 (six) hours as needed for nausea. 20 tablet 0   pantoprazole (PROTONIX) 40 MG tablet TAKE 1 TABLET (40 MG TOTAL) BY MOUTH 2 (TWO) TIMES DAILY. OFFICE VISIT FOR FURTHER REFILLS 180 tablet 0   potassium chloride SA (K-DUR,KLOR-CON) 20 MEQ tablet Take 20 mEq by mouth 2 (two) times daily.      prednisoLONE acetate (PRED FORTE) 1 % ophthalmic suspension PLEASE SEE ATTACHED FOR DETAILED DIRECTIONS     rosuvastatin (CRESTOR) 40 MG tablet Take 1 tablet (40 mg total) by mouth daily. 90 tablet 3   albuterol (PROVENTIL) (2.5 MG/3ML) 0.083% nebulizer solution Take 3 mLs (2.5 mg total) by nebulization every 2 (two) hours as needed  for wheezing. (Patient not taking: Reported on 02/03/2023) 75 mL 12   No facility-administered medications prior to visit.    PAST MEDICAL HISTORY: Past Medical History:  Diagnosis Date   Anxiety    Arthritis    Blood transfusion without reported diagnosis    childhood   Carpal tunnel syndrome, bilateral 07/31/2015   Cataract    Cervical spondylosis without myelopathy    Chronic chest pain    Chronic kidney disease    Chronic low back pain    Chronotropic incompetence    CKD (chronic kidney disease), stage III (HCC)    Coronary artery disease    a. inf-post MI 2011 s/p DES  to RCA. b. DES to RCA 06/2010. c. DES to LAD 2014, residual dz treated medically.   Depression    DVT (deep venous thrombosis) (HCC)    left   Esophageal stricture    GERD (gastroesophageal reflux disease)    Gross hematuria    Heart murmur    History of esophageal dilatation    FOR STRIUCTURE   History of gout    History of hiatal hernia    History of kidney stones    Hyperlipidemia    Hypertension    Hypothyroidism    Internal hemorrhoids    Irritable bowel syndrome    Mild aortic stenosis    by echo 2018   Myocardial infarction (HCC)    Obesity    Orthostatic hypotension    Partial seizure disorder (HCC) NEUROLOGIST-- DR [name]   NOCTURNAL   PONV (postoperative nausea and vomiting)    hard time getting iv site-had to do neck stick 2 yrs ago   Pulmonary fibrosis (HCC)    S/P pericardial cyst excision    02-05-2013  benign   Seizures (HCC)    none in yrs   SVT (supraventricular tachycardia) (HCC)    Trigger finger, acquired 09/30/2015   Right middle finger   Vitamin D deficiency    Weakness     PAST SURGICAL HISTORY: Past Surgical History:  Procedure Laterality Date   BIOPSY OF MEDIASTINAL MASS N/A 02/05/2013   Procedure: RESECTION OF MEDIASTINAL MASS;  Surgeon: Kerin Perna, MD;  Location: [facility] OR;  Service: Thoracic;  Laterality: N/A;   CARDIAC CATHETERIZATION Right 01/24/2015    Procedure: right femoral ARTERIAL LINE INSERTION;  Surgeon: Kerin Perna, MD;  Location: [facility] OR;  Service: Thoracic;  Laterality: Right;   CARDIOVASCULAR STRESS TEST  11-22-2013  dr Eldridge Dace   normal lexiscan study/  no ischemia/  normal LVF and wall motion/ ef 81%   COLONOSCOPY  last one 2014   CORONARY ANGIOGRAM  03/10/2011   Procedure: CORONARY ANGIOGRAM;  Surgeon: Corky Crafts, MD;  Location: [facility] CATH LAB;  Service: Cardiovascular;;   CORONARY ANGIOPLASTY WITH STENT PLACEMENT  11-22-2009  dr Eldridge Dace   Acute inferoposterior MI/  ef 60%,  PCI with DES x1 to dRCA,  25%  mLAD   CORONARY ANGIOPLASTY WITH STENT PLACEMENT  06-26-2010  dr Verdis Prime   Cutting balloon angioplasty to dRCA with DES x1,  40% mLAD (non-obstructive CAD)   CYSTOSCOPY WITH RETROGRADE PYELOGRAM, URETEROSCOPY AND STENT PLACEMENT Right 02/13/2014   Procedure: CYSTOSCOPY WITH RETROGRADE PYELOGRAM, URETEROSCOPY AND STENT PLACEMENT;  Surgeon: Valetta Fuller, MD;  Location: [facility];  Service: Urology;  Laterality: Right;   ECTOPIC PREGNANCY SURGERY  YRS AGO   SALPINGECTOMY   ESOPHAGOGASTRODUODENOSCOPY N/A 02/15/2014   Procedure: ESOPHAGOGASTRODUODENOSCOPY (EGD);  Surgeon: Beverley Fiedler, MD;  Location: [facility];  Service: Endoscopy;  Laterality: N/A;   ESOPHAGOGASTRODUODENOSCOPY (EGD) WITH ESOPHAGEAL DILATION  05-20-2011   HOLMIUM LASER APPLICATION Right 02/13/2014   Procedure: HOLMIUM LASER APPLICATION;  Surgeon: Valetta Fuller, MD;  Location: [facility];  Service: Urology;  Laterality: Right;   LEFT HEART CATH AND CORONARY ANGIOGRAPHY N/A 09/12/2019   Procedure: LEFT HEART CATH AND CORONARY ANGIOGRAPHY;  Surgeon: Corky Crafts, MD;  Location: [facility] INVASIVE CV LAB;  Service: Cardiovascular;  Laterality: N/A;   LEFT HEART CATH AND CORONARY ANGIOGRAPHY N/A 09/16/2021   Procedure: LEFT HEART CATH AND CORONARY ANGIOGRAPHY;  Surgeon: Swaziland, Peter M, MD;  Location: [facility] INVASIVE CV  LAB;   Service: Cardiovascular;  Laterality: N/A;   LEFT HEART CATHETERIZATION WITH CORONARY ANGIOGRAM N/A 03/14/2012   Procedure: LEFT HEART CATHETERIZATION WITH CORONARY ANGIOGRAM;  Surgeon: Quintella Reichert, MD;  Location: [facility] CATH LAB;  Service: Cardiovascular;  Laterality: N/A;  Normal LM,  50% pLAD,  70% mLAD,  D2 50-70%, very tortuous LAD,  70-82% ostial LCFX,  50% in-stent restenosis of  dRCA and mRCA  stent and 50-70% ostial PDA,  normal LVSF, ef 60%   LUNG BIOPSY Right 01/24/2015   Procedure: RIGHT LUNG BIOPSY;  Surgeon: Kerin Perna, MD;  Location: [facility] OR;  Service: Thoracic;  Laterality: Right;   MEDIASTERNOTOMY N/A 02/05/2013   Procedure: MEDIAN STERNOTOMY;  Surgeon: Kerin Perna, MD;  Location: [facility] OR;  Service: Thoracic;  Laterality: N/A;   MEDIASTERNOTOMY Left 02/05/2013   NEEDLE GUIDED EXCISION BREAST CALCIFICATIONS Right 08-09-2008   PERCUTANEOUS CORONARY STENT INTERVENTION (PCI-S) N/A 04/03/2012   Procedure: PERCUTANEOUS CORONARY STENT INTERVENTION (PCI-S);  Surgeon: Corky Crafts, MD;  Location: [facility] CATH LAB;  Service: Cardiovascular;  Laterality: N/A;   Successful PCI  mLAD with 2.75x12 Promus stent, postdilated to >0.74mm   THORACOTOMY Right 01/24/2015   Procedure: RIGHT MINI/LIMITED THORACOTOMY;  Surgeon: Kerin Perna, MD;  Location: [facility] OR;  Service: Thoracic;  Laterality: Right;   TRANSTHORACIC ECHOCARDIOGRAM  11-17-2011   grade I diastolic dysfunction/  ef 55-60%   VIDEO BRONCHOSCOPY N/A 01/24/2015   Procedure: RIGHT VIDEO BRONCHOSCOPY;  Surgeon: Kerin Perna, MD;  Location: [facility] OR;  Service: Thoracic;  Laterality: N/A;    FAMILY HISTORY: Family History  Problem Relation Age of Onset   Heart attack Mother    CVA Mother    Heart disease Father    Hypertension Father    Emphysema Father    Coronary artery disease Father    Diabetes Mellitus I Father    Breast cancer Sister    Breast cancer Niece        niece   Colon cancer Neg Hx    Stomach cancer Neg Hx     Esophageal cancer Neg Hx    Rectal cancer Neg Hx    Seizures Neg Hx     SOCIAL HISTORY: Social History   Socioeconomic History   Marital status: Married    Spouse name: Peyton Najjar   Number of children: 0   Years of education: hs   Highest education level: Not on file  Occupational History   Occupation: Retired    Associate Professor: RETIRED  Tobacco Use   Smoking status: Former    Current packs/day: 0.00    Average packs/day: 0.3 packs/day for 3.0 years (0.9 ttl pk-yrs)    Types: Cigarettes    Start date: 11/08/1964    Quit date: 11/09/1967    Years since quitting: 55.2   Smokeless tobacco: Never  Vaping Use   Vaping status: Never Used  Substance and Sexual Activity   Alcohol use: No    Alcohol/week: 0.0 standard drinks of alcohol   Drug use: No   Sexual activity: Not on file  Other Topics Concern   Not on file  Social History Narrative   Lives at home, married   Left-handed   Daily caffeine use: coffee.   Social Drivers of Corporate investment banker Strain: Not on file  Food Insecurity: No Food Insecurity (11/18/2022)   Hunger Vital Sign    Worried About Running Out of Food in the Last Year: Never true    Ran Out  of Food in the Last Year: Never true  Transportation Needs: No Transportation Needs (11/18/2022)   PRAPARE - Administrator, Civil Service (Medical): No    Lack of Transportation (Non-Medical): No  Physical Activity: Not on file  Stress: Not on file  Social Connections: Not on file  Intimate Partner Violence: Not At Risk (11/18/2022)   Humiliation, Afraid, Rape, and Kick questionnaire    Fear of Current or Ex-Partner: No    Emotionally Abused: No    Physically Abused: No    Sexually Abused: No      PHYSICAL EXAM  Vitals:   02/03/23 1038  BP: 114/64  Pulse: 67  Weight: 91 lb 12.8 oz (41.6 kg)  Height: 4\' 11"  (1.499 m)   Body mass index is 18.54 kg/m.  Generalized: Well developed, in no acute distress   Neurological examination   Mentation: Alert oriented to time, place, history taking. Follows all commands speech and language fluent Cranial nerve II-XII: Pupils were equal round reactive to light. Extraocular movements were full, visual field were full on confrontational test. Facial sensation and strength were normal.  Tremor in jaw. Head turning and shoulder shrug  were normal and symmetric. Motor: The motor testing reveals 5 over 5 strength in the left upper and lower extremity.  4/5 strength noted in the right upper and lower extremity.  Right hand weakness noted.  Middle digit trigger finger.  Mild to moderate impairment of rapid alternating movement.  Moderate impairment of toe taps and heel taps. Sensory: Sensory testing is intact to soft touch on all 4 extremities. No evidence of extinction is noted.  Coordination: Cerebellar testing reveals good finger-nose-finger and heel-to-shin bilaterally.  Gait and station: Uses a walker when ambulating.  Good stride. Reflexes: Deep tendon reflexes are symmetric and normal bilaterally.   DIAGNOSTIC DATA (LABS, IMAGING, TESTING) - I reviewed patient records, labs, notes, testing and imaging myself where available.  Lab Results  Component Value Date   WBC 6.9 11/26/2022   HGB 12.0 11/26/2022   HCT 38.6 11/26/2022   MCV 90.6 11/26/2022   PLT 334 11/26/2022      Component Value Date/Time   NA 134 (L) 11/26/2022 0512   NA 142 10/15/2021 0850   K 4.2 11/26/2022 0512   CL 102 11/26/2022 0512   CO2 25 11/26/2022 0512   GLUCOSE 105 (H) 11/26/2022 0512   BUN 25 (H) 11/26/2022 0512   BUN 17 10/15/2021 0850   CREATININE 1.49 (H) 11/26/2022 0512   CREATININE 1.12 (H) 06/06/2015 0928   CALCIUM 8.9 11/26/2022 0512   PROT 6.1 (L) 11/26/2022 0512   PROT 6.8 11/03/2017 0859   ALBUMIN 2.7 (L) 11/26/2022 0512   ALBUMIN 4.0 11/03/2017 0859   AST 74 (H) 11/26/2022 0512   ALT 44 11/26/2022 0512   ALKPHOS 81 11/26/2022 0512   BILITOT 0.5 11/26/2022 0512   BILITOT 0.4  11/03/2017 0859   GFRNONAA 36 (L) 11/26/2022 0512   GFRAA 51 (L) 09/04/2019 1623   Lab Results  Component Value Date   CHOL 138 09/16/2021   HDL 56 09/16/2021   LDLCALC 60 09/16/2021   TRIG 110 09/16/2021   CHOLHDL 2.5 09/16/2021   Lab Results  Component Value Date   HGBA1C 6.5 (H) 10/15/2014   Lab Results  Component Value Date   VITAMINB12 183 11/25/2022   Lab Results  Component Value Date   TSH 1.063 08/18/2017      ASSESSMENT AND PLAN 78 y.o. year old female  has a past medical history of Anxiety, Arthritis, Blood transfusion without reported diagnosis, Carpal tunnel syndrome, bilateral (07/31/2015), Cataract, Cervical spondylosis without myelopathy, Chronic chest pain, Chronic kidney disease, Chronic low back pain, Chronotropic incompetence, CKD (chronic kidney disease), stage III (HCC), Coronary artery disease, Depression, DVT (deep venous thrombosis) (HCC), Esophageal stricture, GERD (gastroesophageal reflux disease), Gross hematuria, Heart murmur, History of esophageal dilatation, History of gout, History of hiatal hernia, History of kidney stones, Hyperlipidemia, Hypertension, Hypothyroidism, Internal hemorrhoids, Irritable bowel syndrome, Mild aortic stenosis, Myocardial infarction (HCC), Obesity, Orthostatic hypotension, Partial seizure disorder (HCC) (NEUROLOGIST-- DR [name]), PONV (postoperative nausea and vomiting), Pulmonary fibrosis (HCC), S/P pericardial cyst excision, Seizures (HCC), SVT (supraventricular tachycardia) (HCC), Trigger finger, acquired (09/30/2015), Vitamin D deficiency, and Weakness. here with:  1.  Seizures  Continue Keppra 500 mg twice a day Advised if she has any seizure events to let us know  2. Tremor  I have requested previous notes from the old system to see if workup was ever completed for tremor. There are some features of Parkinson's noted on exam.  Will get her evaluated by Dr. Frances Furbish   3.  Right sided weakness  Conflicting reports  from patient and previous exams.  Patient states that her right side has always been weaker however previous documentation does not reflect that with exception of right hand weakness. MRI was negative for an acute infarct however patient did have remote chronic lacunar infarct noted and meningoma. Report in EPIC.    This patient was a previous patient of Dr. Anne [name].  I will work the patient in within the next 1 to 2 months with Dr. Frances Furbish to evaluate for possible Parkinson's and to get established with another neurologist here in our office.     Butch Penny, MSN, NP-C 02/03/2023, 10:49 AM [facility] [address] [place], [place] [address] [phone]

## 2023-02-04 DIAGNOSIS — E039 Hypothyroidism, unspecified: Secondary | ICD-10-CM | POA: Diagnosis not present

## 2023-02-04 DIAGNOSIS — E876 Hypokalemia: Secondary | ICD-10-CM | POA: Diagnosis not present

## 2023-02-04 DIAGNOSIS — Z951 Presence of aortocoronary bypass graft: Secondary | ICD-10-CM | POA: Diagnosis not present

## 2023-02-04 DIAGNOSIS — E669 Obesity, unspecified: Secondary | ICD-10-CM | POA: Diagnosis not present

## 2023-02-04 DIAGNOSIS — Z87891 Personal history of nicotine dependence: Secondary | ICD-10-CM | POA: Diagnosis not present

## 2023-02-04 DIAGNOSIS — I129 Hypertensive chronic kidney disease with stage 1 through stage 4 chronic kidney disease, or unspecified chronic kidney disease: Secondary | ICD-10-CM | POA: Diagnosis not present

## 2023-02-04 DIAGNOSIS — I350 Nonrheumatic aortic (valve) stenosis: Secondary | ICD-10-CM | POA: Diagnosis not present

## 2023-02-04 DIAGNOSIS — Z955 Presence of coronary angioplasty implant and graft: Secondary | ICD-10-CM | POA: Diagnosis not present

## 2023-02-04 DIAGNOSIS — Z7982 Long term (current) use of aspirin: Secondary | ICD-10-CM | POA: Diagnosis not present

## 2023-02-04 DIAGNOSIS — M109 Gout, unspecified: Secondary | ICD-10-CM | POA: Diagnosis not present

## 2023-02-04 DIAGNOSIS — Z86718 Personal history of other venous thrombosis and embolism: Secondary | ICD-10-CM | POA: Diagnosis not present

## 2023-02-04 DIAGNOSIS — E559 Vitamin D deficiency, unspecified: Secondary | ICD-10-CM | POA: Diagnosis not present

## 2023-02-05 ENCOUNTER — Other Ambulatory Visit: Payer: Self-pay | Admitting: Internal Medicine

## 2023-02-07 ENCOUNTER — Ambulatory Visit (AMBULATORY_SURGERY_CENTER): Payer: No Typology Code available for payment source | Admitting: Internal Medicine

## 2023-02-07 ENCOUNTER — Encounter: Payer: Self-pay | Admitting: Internal Medicine

## 2023-02-07 VITALS — BP 129/66 | HR 82 | Temp 98.0°F | Resp 20 | Ht 59.0 in | Wt 91.0 lb

## 2023-02-07 DIAGNOSIS — K317 Polyp of stomach and duodenum: Secondary | ICD-10-CM | POA: Diagnosis not present

## 2023-02-07 DIAGNOSIS — E669 Obesity, unspecified: Secondary | ICD-10-CM | POA: Diagnosis not present

## 2023-02-07 DIAGNOSIS — K449 Diaphragmatic hernia without obstruction or gangrene: Secondary | ICD-10-CM

## 2023-02-07 DIAGNOSIS — K219 Gastro-esophageal reflux disease without esophagitis: Secondary | ICD-10-CM

## 2023-02-07 DIAGNOSIS — F419 Anxiety disorder, unspecified: Secondary | ICD-10-CM | POA: Diagnosis not present

## 2023-02-07 DIAGNOSIS — K222 Esophageal obstruction: Secondary | ICD-10-CM

## 2023-02-07 DIAGNOSIS — R1310 Dysphagia, unspecified: Secondary | ICD-10-CM

## 2023-02-07 DIAGNOSIS — I2510 Atherosclerotic heart disease of native coronary artery without angina pectoris: Secondary | ICD-10-CM | POA: Diagnosis not present

## 2023-02-07 MED ORDER — SODIUM CHLORIDE 0.9 % IV SOLN: 500.0000 mL | Freq: Once | INTRAVENOUS | Status: DC

## 2023-02-07 NOTE — Progress Notes (Signed)
Report to PACU, RN, vss, BBS= Clear.

## 2023-02-07 NOTE — Patient Instructions (Signed)
-   Post dilation diet. - Continue present medications. - Return to the care of your primary provider   YOU HAD AN ENDOSCOPIC PROCEDURE TODAY AT THE [facility]:   Refer to the procedure report that was given to you for any specific questions about what was found during the examination.  If the procedure report does not answer your questions, please call your gastroenterologist to clarify.  If you requested that your care partner not be given the details of your procedure findings, then the procedure report has been included in a sealed envelope for you to review at your convenience later.  YOU SHOULD EXPECT: Some feelings of bloating in the abdomen. Passage of more gas than usual.  Walking can help get rid of the air that was put into your GI tract during the procedure and reduce the bloating. If you had a lower endoscopy (such as a colonoscopy or flexible sigmoidoscopy) you may notice spotting of blood in your stool or on the toilet paper. If you underwent a bowel prep for your procedure, you may not have a normal bowel movement for a few days.  Please Note:  You might notice some irritation and congestion in your nose or some drainage.  This is from the oxygen used during your procedure.  There is no need for concern and it should clear up in a day or so.  SYMPTOMS TO REPORT IMMEDIATELY: Following upper endoscopy (EGD)  Vomiting of blood or coffee ground material  New chest pain or pain under the shoulder blades  Painful or persistently difficult swallowing  New shortness of breath  Fever of 100F or higher  Black, tarry-looking stools  For urgent or emergent issues, a gastroenterologist can be reached at any hour by calling [phone]. Do not use MyChart messaging for urgent concerns.    DIET:  We do recommend a small meal at first, but then you may proceed to your regular diet.  Drink plenty of fluids but you should avoid alcoholic beverages for 24 hours.  ACTIVITY:  You  should plan to take it easy for the rest of today and you should NOT DRIVE or use heavy machinery until tomorrow (because of the sedation medicines used during the test).    FOLLOW UP: Our staff will call the number listed on your records the next business day following your procedure.  We will call around 7:15- 8:00 am to check on you and address any questions or concerns that you may have regarding the information given to you following your procedure. If we do not reach you, we will leave a message.     If any biopsies were taken you will be contacted by phone or by letter within the next 1-3 weeks.  Please call us at [phone] if you have not heard about the biopsies in 3 weeks.    SIGNATURES/CONFIDENTIALITY: You and/or your care partner have signed paperwork which will be entered into your electronic medical record.  These signatures attest to the fact that that the information above on your After Visit Summary has been reviewed and is understood.  Full responsibility of the confidentiality of this discharge information lies with you and/or your care-partner.

## 2023-02-07 NOTE — Op Note (Signed)
[facility] Patient Name: [name] Procedure Date: 02/07/2023 10:05 AM MRN: [id number] Endoscopist: Wilhemina Bonito. Marina Goodell , MD, [id number] Age: 78 Referring MD:  Date of Birth: Aug 28, 1945 Gender: Female Account #: [id number] Procedure:                Upper GI endoscopy with [facility] dilation of the                            esophagus. [address] [place] Indications:              Dysphagia, , Esophageal reflux, GERD, abnormal                            esophagram Medicines:                Monitored Anesthesia Care Procedure:                Pre-Anesthesia Assessment:                           - Prior to the procedure, a History and Physical                            was performed, and patient medications and                            allergies were reviewed. The patient's tolerance of                            previous anesthesia was also reviewed. The risks                            and benefits of the procedure and the sedation                            options and risks were discussed with the patient.                            All questions were answered, and informed consent                            was obtained. Prior Anticoagulants: The patient has                            taken no anticoagulant or antiplatelet agents. ASA                            Grade Assessment: III - A patient with severe                            systemic disease. After reviewing the risks and                            benefits, the patient was deemed in satisfactory  condition to undergo the procedure.                           After obtaining informed consent, the endoscope was                            passed under direct vision. Throughout the                            procedure, the patient's blood pressure, pulse, and                            oxygen saturations were monitored continuously. The                            GIF [id number] #[id number] was introduced through  the                            mouth, and advanced to the second part of duodenum.                            The upper GI endoscopy was accomplished without                            difficulty. The patient tolerated the procedure                            well. Scope In: Scope Out: Findings:                 One benign-appearing, intrinsic moderate stenosis                            was found 35 cm from the incisors. This stenosis                            measured 1.5 cm (inner diameter). The scope was                            withdrawn. Dilation was performed with a Maloney                            dilator with no resistance at 54 Fr.                           The exam of the esophagus was otherwise normal.                           The stomach revealed a hiatal hernia and multiple                            benign fundic gland type polyps.                           The examined duodenum was normal.  The cardia and gastric fundus were normal on                            retroflexion. Complications:            No immediate complications. Estimated Blood Loss:     Estimated blood loss: none. Impression:               - Benign-appearing esophageal stenosis. Dilated.                           - Normal stomach except for hiatal hernia and                            benign fundic gland polyps.                           - Normal examined duodenum.                           - No specimens collected. Recommendation:           - Patient has a contact number available for                            emergencies. The signs and symptoms of potential                            delayed complications were discussed with the                            patient. Return to normal activities tomorrow.                            Written discharge instructions were provided to the                            patient.                           - Post dilation diet.                            - Continue present medications.                           - Return to the care of your primary provider Wilhemina Bonito. Marina Goodell, MD 02/07/2023 10:17:51 AM This report has been signed electronically.

## 2023-02-07 NOTE — Progress Notes (Signed)
Pt's states no medical or surgical changes since previsit or office visit.  O2 RECHECKED AND REMAINED AT 97%-98%. DENIES ANY SIGNS OR SYMPTOMS.   CRNA NOTIFIED.

## 2023-02-07 NOTE — Progress Notes (Signed)
Expand All Collapse All HISTORY OF PRESENT ILLNESS:   [name] is a 78 y.o. female with multiple significant medical problems as listed below.  She presents today's office with a chief complaint of pill dysphagia.  I last saw the patient March 2023.  Her husband Peyton Najjar passed away in 2021-06-01.  She is accompanied today by her niece.   Patient was hospitalized November 18, 2022 through November 26, 2022 with urinary tract infection.  She was complaining of dysphagia at that time.  Esophagram was performed November 19, 2022 and revealed smooth mild narrowing of the distal esophagus with nonpassage of 13 mm barium tablet suspicious for distal esophageal stricture.  She presents today.   She complains of pill dysphagia.  Does better when pills are crushed.  Less trouble with food.  Unclear how much she is eating.  She continues to lose weight.  She does take pantoprazole for GERD.  No longer on Plavix or oral anticoagulant.  Looks frail compared to her last visit   REVIEW OF SYSTEMS:   All non-GI ROS noncontributory       Past Medical History:  Diagnosis Date   Anxiety     Arthritis     Blood transfusion without reported diagnosis      childhood   Carpal tunnel syndrome, bilateral 07/31/2015   Cataract     Cervical spondylosis without myelopathy     Chronic chest pain     Chronic kidney disease     Chronic low back pain     Chronotropic incompetence     CKD (chronic kidney disease), stage III (HCC)     Coronary artery disease      a. inf-post MI 2011 s/p DES to RCA. b. DES to RCA 06/2010. c. DES to LAD 2014, residual dz treated medically.   Depression     DVT (deep venous thrombosis) (HCC)      left   Esophageal stricture     GERD (gastroesophageal reflux disease)     Gross hematuria     Heart murmur     History of esophageal dilatation      FOR STRIUCTURE   History of gout     History of hiatal hernia     History of kidney stones     Hyperlipidemia     Hypertension      Hypothyroidism     Internal hemorrhoids     Irritable bowel syndrome     Mild aortic stenosis      by echo 2018   Myocardial infarction (HCC)     Obesity     Orthostatic hypotension     Partial seizure disorder (HCC) NEUROLOGIST-- DR Anne Hahn    NOCTURNAL   PONV (postoperative nausea and vomiting)      hard time getting iv site-had to do neck stick 2 yrs ago   Pulmonary fibrosis (HCC)     S/P pericardial cyst excision      02-05-2013  benign   Seizures (HCC)      none in yrs   SVT (supraventricular tachycardia) (HCC)     Trigger finger, acquired 09/30/2015    Right middle finger   Vitamin D deficiency     Weakness                 Past Surgical History:  Procedure Laterality Date   BIOPSY OF MEDIASTINAL MASS N/A 02/05/2013    Procedure: RESECTION OF MEDIASTINAL MASS;  Surgeon: Kerin Perna, MD;  Location: [facility]  OR;  Service: Thoracic;  Laterality: N/A;   CARDIAC CATHETERIZATION Right 01/24/2015    Procedure: right femoral ARTERIAL LINE INSERTION;  Surgeon: Kerin Perna, MD;  Location: [facility] OR;  Service: Thoracic;  Laterality: Right;   CARDIOVASCULAR STRESS TEST   11-22-2013  dr Eldridge Dace    normal lexiscan study/  no ischemia/  normal LVF and wall motion/ ef 81%   COLONOSCOPY   last one 2014   CORONARY ANGIOGRAM   03/10/2011    Procedure: CORONARY ANGIOGRAM;  Surgeon: Corky Crafts, MD;  Location: [facility];  Service: Cardiovascular;;   CORONARY ANGIOPLASTY WITH STENT PLACEMENT   11-22-2009  dr Eldridge Dace    Acute inferoposterior MI/  ef 60%,  PCI with DES x1 to dRCA,  25%  mLAD   CORONARY ANGIOPLASTY WITH STENT PLACEMENT   06-26-2010  dr Verdis Prime    Cutting balloon angioplasty to dRCA with DES x1,  40% mLAD (non-obstructive CAD)   CYSTOSCOPY WITH RETROGRADE PYELOGRAM, URETEROSCOPY AND STENT PLACEMENT Right 02/13/2014    Procedure: CYSTOSCOPY WITH RETROGRADE PYELOGRAM, URETEROSCOPY AND STENT PLACEMENT;  Surgeon: Valetta Fuller, MD;  Location: [facility];   Service: Urology;  Laterality: Right;   ECTOPIC PREGNANCY SURGERY   YRS AGO    SALPINGECTOMY   ESOPHAGOGASTRODUODENOSCOPY N/A 02/15/2014    Procedure: ESOPHAGOGASTRODUODENOSCOPY (EGD);  Surgeon: Beverley Fiedler, MD;  Location: [facility];  Service: Endoscopy;  Laterality: N/A;   ESOPHAGOGASTRODUODENOSCOPY (EGD) WITH ESOPHAGEAL DILATION   05-20-2011   HOLMIUM LASER APPLICATION Right 02/13/2014    Procedure: HOLMIUM LASER APPLICATION;  Surgeon: Valetta Fuller, MD;  Location: [facility];  Service: Urology;  Laterality: Right;   LEFT HEART CATH AND CORONARY ANGIOGRAPHY N/A 09/12/2019    Procedure: LEFT HEART CATH AND CORONARY ANGIOGRAPHY;  Surgeon: Corky Crafts, MD;  Location: [facility] INVASIVE CV LAB;  Service: Cardiovascular;  Laterality: N/A;   LEFT HEART CATH AND CORONARY ANGIOGRAPHY N/A 09/16/2021    Procedure: LEFT HEART CATH AND CORONARY ANGIOGRAPHY;  Surgeon: [place]az[place], Peter M, MD;  Location: [facility] INVASIVE CV LAB;  Service: Cardiovascular;  Laterality: N/A;   LEFT HEART CATHETERIZATION WITH CORONARY ANGIOGRAM N/A 03/14/2012    Procedure: LEFT HEART CATHETERIZATION WITH CORONARY ANGIOGRAM;  Surgeon: Quintella Reichert, MD;  Location: [facility] CATH LAB;  Service: Cardiovascular;  Laterality: N/A;  Normal LM,  50% pLAD,  70% mLAD,  D2 50-70%, very tortuous LAD,  70-82% ostial LCFX,  50% in-stent restenosis of  dRCA and mRCA  stent and 50-70% ostial PDA,  normal LVSF, ef 60%   LUNG BIOPSY Right 01/24/2015    Procedure: RIGHT LUNG BIOPSY;  Surgeon: Kerin Perna, MD;  Location: [facility] OR;  Service: Thoracic;  Laterality: Right;   MEDIASTERNOTOMY N/A 02/05/2013    Procedure: MEDIAN STERNOTOMY;  Surgeon: Kerin Perna, MD;  Location: [facility] OR;  Service: Thoracic;  Laterality: N/A;   MEDIASTERNOTOMY Left 02/05/2013   NEEDLE GUIDED EXCISION BREAST CALCIFICATIONS Right 08-09-2008   PERCUTANEOUS CORONARY STENT INTERVENTION (PCI-S) N/A 04/03/2012    Procedure: PERCUTANEOUS CORONARY STENT INTERVENTION (PCI-S);   Surgeon: Corky Crafts, MD;  Location: [facility] CATH LAB;  Service: Cardiovascular;  Laterality: N/A;   Successful PCI  mLAD with 2.75x12 Promus stent, postdilated to >0.79mm   THORACOTOMY Right 01/24/2015    Procedure: RIGHT MINI/LIMITED THORACOTOMY;  Surgeon: Kerin Perna, MD;  Location: [facility] OR;  Service: Thoracic;  Laterality: Right;   TRANSTHORACIC ECHOCARDIOGRAM   11-17-2011    grade I  diastolic dysfunction/  ef 55-60%   VIDEO BRONCHOSCOPY N/A 01/24/2015    Procedure: RIGHT VIDEO BRONCHOSCOPY;  Surgeon: Kerin Perna, MD;  Location: [facility] OR;  Service: Thoracic;  Laterality: N/A;          Social History Sharisa AZHAR YOGI  reports that she quit smoking about 55 years ago. Her smoking use included cigarettes. She started smoking about 58 years ago. She has a 0.9 pack-year smoking history. She has never used smokeless tobacco. She reports that she does not drink alcohol and does not use drugs.   family history includes Breast cancer in her niece and sister; CVA in her mother; Coronary artery disease in her father; Diabetes Mellitus I in her father; Emphysema in her father; Heart attack in her mother; Heart disease in her father; Hypertension in her father.   Allergies  No Known Allergies         PHYSICAL EXAMINATION: Vital signs: BP 112/68   Pulse 72   Ht 4\' 11"  (1.499 m)   Wt 91 lb (41.3 kg)   BMI 18.38 kg/m   Constitutional: Thin, chronically ill-appearing, frail, no acute distress Psychiatric: Pleasant, alert and oriented x3, cooperative Eyes: extraocular movements intact, anicteric, conjunctiva pink Mouth: oral pharynx moist, no lesions Neck: supple no lymphadenopathy Cardiovascular: heart regular rate and rhythm, 3/6 systolic murmur Lungs: clear to auscultation bilaterally Abdomen: soft, nontender, nondistended, no obvious ascites, no peritoneal signs, normal bowel sounds, no organomegaly Rectal: Omitted Extremities: no clubbing, cyanosis, or lower extremity edema  bilaterally Skin: no lesions on visible extremities Neuro: No focal deficits.  Cranial nerves intact   ASSESSMENT:   1.  Pill dysphagia 2.  Abnormal esophagram with distal stricture and delayed passage of tablet 3.  Chronic dysphagia secondary to both dysmotility and esophageal stricturing 4.  Multiple significant medical problems 5.  GERD   PLAN:   1.  Continue PPI 2.  Crush pills when possible-that that helped 3.  Schedule upper endoscopy with esophageal dilation.  The patient is high risk given her age and comorbidities.The nature of the procedure, as well as the risks, benefits, and alternatives were carefully and thoroughly reviewed with the patient. Ample time for discussion and questions allowed. The patient understood, was satisfied, and agreed to proceed. 4.  Ongoing general medical care with PCP

## 2023-02-07 NOTE — Progress Notes (Signed)
Called to room to assist with EGD.

## 2023-02-08 ENCOUNTER — Telehealth: Payer: Self-pay

## 2023-02-08 NOTE — Telephone Encounter (Signed)
Attempted f/u call. No answer, VM not setup.

## 2023-02-17 DIAGNOSIS — Z7982 Long term (current) use of aspirin: Secondary | ICD-10-CM | POA: Diagnosis not present

## 2023-02-17 DIAGNOSIS — Z955 Presence of coronary angioplasty implant and graft: Secondary | ICD-10-CM | POA: Diagnosis not present

## 2023-02-17 DIAGNOSIS — I350 Nonrheumatic aortic (valve) stenosis: Secondary | ICD-10-CM | POA: Diagnosis not present

## 2023-02-17 DIAGNOSIS — E669 Obesity, unspecified: Secondary | ICD-10-CM | POA: Diagnosis not present

## 2023-02-17 DIAGNOSIS — I129 Hypertensive chronic kidney disease with stage 1 through stage 4 chronic kidney disease, or unspecified chronic kidney disease: Secondary | ICD-10-CM | POA: Diagnosis not present

## 2023-02-17 DIAGNOSIS — E559 Vitamin D deficiency, unspecified: Secondary | ICD-10-CM | POA: Diagnosis not present

## 2023-02-17 DIAGNOSIS — E876 Hypokalemia: Secondary | ICD-10-CM | POA: Diagnosis not present

## 2023-02-17 DIAGNOSIS — Z87891 Personal history of nicotine dependence: Secondary | ICD-10-CM | POA: Diagnosis not present

## 2023-02-17 DIAGNOSIS — M109 Gout, unspecified: Secondary | ICD-10-CM | POA: Diagnosis not present

## 2023-02-17 DIAGNOSIS — E039 Hypothyroidism, unspecified: Secondary | ICD-10-CM | POA: Diagnosis not present

## 2023-02-17 DIAGNOSIS — Z951 Presence of aortocoronary bypass graft: Secondary | ICD-10-CM | POA: Diagnosis not present

## 2023-02-17 DIAGNOSIS — Z86718 Personal history of other venous thrombosis and embolism: Secondary | ICD-10-CM | POA: Diagnosis not present

## 2023-02-24 DIAGNOSIS — E039 Hypothyroidism, unspecified: Secondary | ICD-10-CM | POA: Diagnosis not present

## 2023-02-24 DIAGNOSIS — I129 Hypertensive chronic kidney disease with stage 1 through stage 4 chronic kidney disease, or unspecified chronic kidney disease: Secondary | ICD-10-CM | POA: Diagnosis not present

## 2023-02-24 DIAGNOSIS — Z951 Presence of aortocoronary bypass graft: Secondary | ICD-10-CM | POA: Diagnosis not present

## 2023-02-24 DIAGNOSIS — Z7982 Long term (current) use of aspirin: Secondary | ICD-10-CM | POA: Diagnosis not present

## 2023-02-24 DIAGNOSIS — Z79891 Long term (current) use of opiate analgesic: Secondary | ICD-10-CM | POA: Diagnosis not present

## 2023-02-24 DIAGNOSIS — Z87891 Personal history of nicotine dependence: Secondary | ICD-10-CM | POA: Diagnosis not present

## 2023-02-24 DIAGNOSIS — Z681 Body mass index (BMI) 19 or less, adult: Secondary | ICD-10-CM | POA: Diagnosis not present

## 2023-02-24 DIAGNOSIS — I350 Nonrheumatic aortic (valve) stenosis: Secondary | ICD-10-CM | POA: Diagnosis not present

## 2023-02-24 DIAGNOSIS — Z955 Presence of coronary angioplasty implant and graft: Secondary | ICD-10-CM | POA: Diagnosis not present

## 2023-02-24 DIAGNOSIS — M109 Gout, unspecified: Secondary | ICD-10-CM | POA: Diagnosis not present

## 2023-02-24 DIAGNOSIS — Z86718 Personal history of other venous thrombosis and embolism: Secondary | ICD-10-CM | POA: Diagnosis not present

## 2023-02-24 DIAGNOSIS — E669 Obesity, unspecified: Secondary | ICD-10-CM | POA: Diagnosis not present

## 2023-02-24 DIAGNOSIS — Z87440 Personal history of urinary (tract) infections: Secondary | ICD-10-CM | POA: Diagnosis not present

## 2023-02-28 ENCOUNTER — Ambulatory Visit: Payer: No Typology Code available for payment source | Admitting: Cardiology

## 2023-02-28 ENCOUNTER — Ambulatory Visit: Payer: No Typology Code available for payment source | Admitting: Neurology

## 2023-02-28 ENCOUNTER — Telehealth: Payer: Self-pay | Admitting: Neurology

## 2023-02-28 NOTE — Telephone Encounter (Signed)
NS for neuro visit today.

## 2023-03-06 ENCOUNTER — Other Ambulatory Visit: Payer: Self-pay | Admitting: Cardiology

## 2023-03-06 DIAGNOSIS — I10 Essential (primary) hypertension: Secondary | ICD-10-CM

## 2023-03-10 DIAGNOSIS — I2510 Atherosclerotic heart disease of native coronary artery without angina pectoris: Secondary | ICD-10-CM | POA: Diagnosis not present

## 2023-03-10 DIAGNOSIS — K219 Gastro-esophageal reflux disease without esophagitis: Secondary | ICD-10-CM | POA: Diagnosis not present

## 2023-03-10 DIAGNOSIS — N1831 Chronic kidney disease, stage 3a: Secondary | ICD-10-CM | POA: Diagnosis not present

## 2023-03-10 DIAGNOSIS — E7800 Pure hypercholesterolemia, unspecified: Secondary | ICD-10-CM | POA: Diagnosis not present

## 2023-03-10 DIAGNOSIS — E213 Hyperparathyroidism, unspecified: Secondary | ICD-10-CM | POA: Diagnosis not present

## 2023-03-10 DIAGNOSIS — E1165 Type 2 diabetes mellitus with hyperglycemia: Secondary | ICD-10-CM | POA: Diagnosis not present

## 2023-03-10 DIAGNOSIS — J479 Bronchiectasis, uncomplicated: Secondary | ICD-10-CM | POA: Diagnosis not present

## 2023-03-10 DIAGNOSIS — I10 Essential (primary) hypertension: Secondary | ICD-10-CM | POA: Diagnosis not present

## 2023-03-10 DIAGNOSIS — F411 Generalized anxiety disorder: Secondary | ICD-10-CM | POA: Diagnosis not present

## 2023-03-10 DIAGNOSIS — F324 Major depressive disorder, single episode, in partial remission: Secondary | ICD-10-CM | POA: Diagnosis not present

## 2023-03-10 DIAGNOSIS — I5032 Chronic diastolic (congestive) heart failure: Secondary | ICD-10-CM | POA: Diagnosis not present

## 2023-03-20 ENCOUNTER — Emergency Department (HOSPITAL_COMMUNITY)

## 2023-03-20 ENCOUNTER — Inpatient Hospital Stay (HOSPITAL_COMMUNITY)
Admission: EM | Admit: 2023-03-20 | Discharge: 2023-04-01 | DRG: 291 | Disposition: A | Discharge status: Hospice | Attending: Family Medicine | Admitting: Family Medicine

## 2023-03-20 ENCOUNTER — Other Ambulatory Visit: Payer: Self-pay

## 2023-03-20 DIAGNOSIS — J9601 Acute respiratory failure with hypoxia: Secondary | ICD-10-CM | POA: Diagnosis present

## 2023-03-20 DIAGNOSIS — Z515 Encounter for palliative care: Secondary | ICD-10-CM

## 2023-03-20 DIAGNOSIS — I350 Nonrheumatic aortic (valve) stenosis: Secondary | ICD-10-CM

## 2023-03-20 DIAGNOSIS — N1831 Chronic kidney disease, stage 3a: Secondary | ICD-10-CM | POA: Diagnosis present

## 2023-03-20 DIAGNOSIS — I10 Essential (primary) hypertension: Secondary | ICD-10-CM | POA: Diagnosis present

## 2023-03-20 DIAGNOSIS — I2510 Atherosclerotic heart disease of native coronary artery without angina pectoris: Secondary | ICD-10-CM | POA: Diagnosis not present

## 2023-03-20 DIAGNOSIS — I517 Cardiomegaly: Secondary | ICD-10-CM | POA: Diagnosis not present

## 2023-03-20 DIAGNOSIS — Z823 Family history of stroke: Secondary | ICD-10-CM

## 2023-03-20 DIAGNOSIS — G40109 Localization-related (focal) (partial) symptomatic epilepsy and epileptic syndromes with simple partial seizures, not intractable, without status epilepticus: Secondary | ICD-10-CM | POA: Diagnosis present

## 2023-03-20 DIAGNOSIS — I509 Heart failure, unspecified: Principal | ICD-10-CM

## 2023-03-20 DIAGNOSIS — I110 Hypertensive heart disease with heart failure: Secondary | ICD-10-CM | POA: Diagnosis not present

## 2023-03-20 DIAGNOSIS — G9341 Metabolic encephalopathy: Secondary | ICD-10-CM | POA: Diagnosis not present

## 2023-03-20 DIAGNOSIS — Z833 Family history of diabetes mellitus: Secondary | ICD-10-CM

## 2023-03-20 DIAGNOSIS — N1832 Chronic kidney disease, stage 3b: Secondary | ICD-10-CM | POA: Diagnosis not present

## 2023-03-20 DIAGNOSIS — Z8669 Personal history of other diseases of the nervous system and sense organs: Secondary | ICD-10-CM

## 2023-03-20 DIAGNOSIS — E876 Hypokalemia: Secondary | ICD-10-CM | POA: Diagnosis not present

## 2023-03-20 DIAGNOSIS — Z87891 Personal history of nicotine dependence: Secondary | ICD-10-CM

## 2023-03-20 DIAGNOSIS — Z86718 Personal history of other venous thrombosis and embolism: Secondary | ICD-10-CM

## 2023-03-20 DIAGNOSIS — G894 Chronic pain syndrome: Secondary | ICD-10-CM | POA: Diagnosis present

## 2023-03-20 DIAGNOSIS — R0902 Hypoxemia: Secondary | ICD-10-CM | POA: Diagnosis not present

## 2023-03-20 DIAGNOSIS — E039 Hypothyroidism, unspecified: Secondary | ICD-10-CM | POA: Diagnosis present

## 2023-03-20 DIAGNOSIS — R0602 Shortness of breath: Secondary | ICD-10-CM | POA: Diagnosis not present

## 2023-03-20 DIAGNOSIS — K219 Gastro-esophageal reflux disease without esophagitis: Secondary | ICD-10-CM | POA: Diagnosis not present

## 2023-03-20 DIAGNOSIS — J90 Pleural effusion, not elsewhere classified: Secondary | ICD-10-CM | POA: Diagnosis not present

## 2023-03-20 DIAGNOSIS — R918 Other nonspecific abnormal finding of lung field: Secondary | ICD-10-CM | POA: Diagnosis not present

## 2023-03-20 DIAGNOSIS — Z66 Do not resuscitate: Secondary | ICD-10-CM | POA: Diagnosis present

## 2023-03-20 DIAGNOSIS — R230 Cyanosis: Secondary | ICD-10-CM | POA: Diagnosis not present

## 2023-03-20 DIAGNOSIS — I959 Hypotension, unspecified: Secondary | ICD-10-CM | POA: Diagnosis present

## 2023-03-20 DIAGNOSIS — Z452 Encounter for adjustment and management of vascular access device: Secondary | ICD-10-CM | POA: Diagnosis not present

## 2023-03-20 DIAGNOSIS — R339 Retention of urine, unspecified: Secondary | ICD-10-CM | POA: Diagnosis present

## 2023-03-20 DIAGNOSIS — F419 Anxiety disorder, unspecified: Secondary | ICD-10-CM | POA: Diagnosis present

## 2023-03-20 DIAGNOSIS — Z79899 Other long term (current) drug therapy: Secondary | ICD-10-CM

## 2023-03-20 DIAGNOSIS — Z87442 Personal history of urinary calculi: Secondary | ICD-10-CM

## 2023-03-20 DIAGNOSIS — Z803 Family history of malignant neoplasm of breast: Secondary | ICD-10-CM

## 2023-03-20 DIAGNOSIS — I4819 Other persistent atrial fibrillation: Secondary | ICD-10-CM | POA: Diagnosis not present

## 2023-03-20 DIAGNOSIS — Z681 Body mass index (BMI) 19 or less, adult: Secondary | ICD-10-CM

## 2023-03-20 DIAGNOSIS — J849 Interstitial pulmonary disease, unspecified: Secondary | ICD-10-CM | POA: Diagnosis present

## 2023-03-20 DIAGNOSIS — I071 Rheumatic tricuspid insufficiency: Secondary | ICD-10-CM | POA: Diagnosis present

## 2023-03-20 DIAGNOSIS — I5033 Acute on chronic diastolic (congestive) heart failure: Secondary | ICD-10-CM | POA: Diagnosis not present

## 2023-03-20 DIAGNOSIS — I471 Supraventricular tachycardia, unspecified: Secondary | ICD-10-CM | POA: Diagnosis not present

## 2023-03-20 DIAGNOSIS — D649 Anemia, unspecified: Secondary | ICD-10-CM | POA: Diagnosis present

## 2023-03-20 DIAGNOSIS — I480 Paroxysmal atrial fibrillation: Secondary | ICD-10-CM | POA: Diagnosis not present

## 2023-03-20 DIAGNOSIS — K589 Irritable bowel syndrome without diarrhea: Secondary | ICD-10-CM | POA: Diagnosis present

## 2023-03-20 DIAGNOSIS — Z79890 Hormone replacement therapy (postmenopausal): Secondary | ICD-10-CM

## 2023-03-20 DIAGNOSIS — E785 Hyperlipidemia, unspecified: Secondary | ICD-10-CM | POA: Diagnosis present

## 2023-03-20 DIAGNOSIS — I130 Hypertensive heart and chronic kidney disease with heart failure and stage 1 through stage 4 chronic kidney disease, or unspecified chronic kidney disease: Principal | ICD-10-CM | POA: Diagnosis present

## 2023-03-20 DIAGNOSIS — Z7901 Long term (current) use of anticoagulants: Secondary | ICD-10-CM

## 2023-03-20 DIAGNOSIS — R638 Other symptoms and signs concerning food and fluid intake: Secondary | ICD-10-CM | POA: Diagnosis not present

## 2023-03-20 DIAGNOSIS — Z7189 Other specified counseling: Secondary | ICD-10-CM | POA: Diagnosis not present

## 2023-03-20 DIAGNOSIS — F32A Depression, unspecified: Secondary | ICD-10-CM | POA: Diagnosis present

## 2023-03-20 DIAGNOSIS — Z1152 Encounter for screening for COVID-19: Secondary | ICD-10-CM | POA: Diagnosis not present

## 2023-03-20 DIAGNOSIS — N179 Acute kidney failure, unspecified: Secondary | ICD-10-CM | POA: Diagnosis present

## 2023-03-20 DIAGNOSIS — Z7982 Long term (current) use of aspirin: Secondary | ICD-10-CM

## 2023-03-20 DIAGNOSIS — Z789 Other specified health status: Secondary | ICD-10-CM | POA: Diagnosis not present

## 2023-03-20 DIAGNOSIS — R0689 Other abnormalities of breathing: Secondary | ICD-10-CM | POA: Diagnosis not present

## 2023-03-20 DIAGNOSIS — J984 Other disorders of lung: Secondary | ICD-10-CM | POA: Diagnosis not present

## 2023-03-20 DIAGNOSIS — Z604 Social exclusion and rejection: Secondary | ICD-10-CM | POA: Diagnosis present

## 2023-03-20 DIAGNOSIS — Z955 Presence of coronary angioplasty implant and graft: Secondary | ICD-10-CM

## 2023-03-20 DIAGNOSIS — Z825 Family history of asthma and other chronic lower respiratory diseases: Secondary | ICD-10-CM

## 2023-03-20 DIAGNOSIS — Z8249 Family history of ischemic heart disease and other diseases of the circulatory system: Secondary | ICD-10-CM

## 2023-03-20 DIAGNOSIS — R0603 Acute respiratory distress: Secondary | ICD-10-CM | POA: Diagnosis not present

## 2023-03-20 DIAGNOSIS — R069 Unspecified abnormalities of breathing: Secondary | ICD-10-CM | POA: Diagnosis not present

## 2023-03-20 LAB — BASIC METABOLIC PANEL
Anion gap: 14 (ref 5–15)
BUN: 42 mg/dL — ABNORMAL HIGH (ref 8–23)
CO2: 15 mmol/L — ABNORMAL LOW (ref 22–32)
Calcium: 7.4 mg/dL — ABNORMAL LOW (ref 8.9–10.3)
Chloride: 111 mmol/L (ref 98–111)
Creatinine, Ser: 1.15 mg/dL — ABNORMAL HIGH (ref 0.44–1.00)
GFR, Estimated: 49 mL/min — ABNORMAL LOW (ref >60)
Glucose, Bld: 108 mg/dL — ABNORMAL HIGH (ref 70–99)
Potassium: 4.3 mmol/L (ref 3.5–5.1)
Sodium: 140 mmol/L (ref 135–145)

## 2023-03-20 LAB — CBC
HCT: 34.1 % — ABNORMAL LOW (ref 36.0–46.0)
Hemoglobin: 10.6 g/dL — ABNORMAL LOW (ref 12.0–15.0)
MCH: 29 pg (ref 26.0–34.0)
MCHC: 31.1 g/dL (ref 30.0–36.0)
MCV: 93.4 fL (ref 80.0–100.0)
Platelets: 252 10*3/uL (ref 150–400)
RBC: 3.65 MIL/uL — ABNORMAL LOW (ref 3.87–5.11)
RDW: 14.1 % (ref 11.5–15.5)
WBC: 15.6 10*3/uL — ABNORMAL HIGH (ref 4.0–10.5)
nRBC: 0 % (ref 0.0–0.2)

## 2023-03-20 LAB — RESP PANEL BY RT-PCR (RSV, FLU A&B, COVID)  RVPGX2
Influenza A by PCR: NEGATIVE
Influenza B by PCR: NEGATIVE
Resp Syncytial Virus by PCR: NEGATIVE
SARS Coronavirus 2 by RT PCR: NEGATIVE

## 2023-03-20 LAB — TROPONIN I (HIGH SENSITIVITY)
Troponin I (High Sensitivity): 72 ng/L — ABNORMAL HIGH (ref <18)
Troponin I (High Sensitivity): 88 ng/L — ABNORMAL HIGH (ref <18)

## 2023-03-20 LAB — BRAIN NATRIURETIC PEPTIDE: B Natriuretic Peptide: 1056.4 pg/mL — ABNORMAL HIGH (ref 0.0–100.0)

## 2023-03-20 MED ORDER — HYDROCODONE-ACETAMINOPHEN 10-325 MG PO TABS: 1.0000 | ORAL_TABLET | Freq: Four times a day (QID) | ORAL | Status: DC | PRN

## 2023-03-20 MED ORDER — ROSUVASTATIN CALCIUM 20 MG PO TABS
40.0000 mg | ORAL_TABLET | Freq: Every day | ORAL | Status: DC
  Administered 2023-03-21 – 2023-03-24 (×5): 40 mg via ORAL
  Filled 2023-03-20 (×5): qty 2
  Filled 2023-03-20: qty 8

## 2023-03-20 MED ORDER — DULOXETINE HCL 60 MG PO CPEP
90.0000 mg | ORAL_CAPSULE | Freq: Every day | ORAL | Status: DC
  Administered 2023-03-21: 90 mg via ORAL
  Filled 2023-03-20 (×2): qty 3
  Filled 2023-03-20: qty 1

## 2023-03-20 MED ORDER — ARIPIPRAZOLE 5 MG PO TABS
5.0000 mg | ORAL_TABLET | Freq: Every day | ORAL | Status: DC
  Administered 2023-03-21: 5 mg via ORAL
  Filled 2023-03-20: qty 3
  Filled 2023-03-20: qty 1
  Filled 2023-03-20: qty 3

## 2023-03-20 MED ORDER — LEVETIRACETAM IN NACL 500 MG/100ML IV SOLN
500.0000 mg | Freq: Two times a day (BID) | INTRAVENOUS | Status: DC
  Administered 2023-03-21 – 2023-03-22 (×3): 500 mg via INTRAVENOUS
  Filled 2023-03-20 (×4): qty 100

## 2023-03-20 MED ORDER — FUROSEMIDE 10 MG/ML IJ SOLN
40.0000 mg | Freq: Once | INTRAMUSCULAR | Status: AC
  Administered 2023-03-20: 40 mg via INTRAVENOUS
  Filled 2023-03-20: qty 4

## 2023-03-20 MED ORDER — ASPIRIN 81 MG PO TBEC
81.0000 mg | DELAYED_RELEASE_TABLET | Freq: Every day | ORAL | Status: DC
  Administered 2023-03-21 – 2023-03-23 (×4): 81 mg via ORAL
  Filled 2023-03-20 (×4): qty 1

## 2023-03-20 MED ORDER — FUROSEMIDE 10 MG/ML IJ SOLN
40.0000 mg | Freq: Two times a day (BID) | INTRAMUSCULAR | Status: DC
  Administered 2023-03-21: 40 mg via INTRAVENOUS
  Filled 2023-03-20: qty 4

## 2023-03-20 MED ORDER — ENOXAPARIN SODIUM 40 MG/0.4ML IJ SOSY
40.0000 mg | PREFILLED_SYRINGE | INTRAMUSCULAR | Status: DC
  Administered 2023-03-21: 40 mg via SUBCUTANEOUS
  Filled 2023-03-20: qty 0.4

## 2023-03-20 MED ORDER — ONDANSETRON HCL 4 MG/2ML IJ SOLN: 4.0000 mg | Freq: Four times a day (QID) | INTRAMUSCULAR | Status: DC | PRN

## 2023-03-20 MED ORDER — LEVETIRACETAM IN NACL 500 MG/100ML IV SOLN: 500.0000 mg | Freq: Two times a day (BID) | INTRAVENOUS | Status: DC

## 2023-03-20 MED ORDER — ACETAMINOPHEN 325 MG PO TABS: 650.0000 mg | ORAL_TABLET | Freq: Four times a day (QID) | ORAL | Status: DC | PRN

## 2023-03-20 MED ORDER — ALBUTEROL SULFATE HFA 108 (90 BASE) MCG/ACT IN AERS: 2.0000 | INHALATION_SPRAY | RESPIRATORY_TRACT | Status: DC | PRN

## 2023-03-20 MED ORDER — LEVOTHYROXINE SODIUM 88 MCG PO TABS
88.0000 ug | ORAL_TABLET | Freq: Every morning | ORAL | Status: DC
  Administered 2023-03-21: 88 ug via ORAL
  Filled 2023-03-20: qty 1

## 2023-03-20 MED ORDER — ALPRAZOLAM 0.25 MG PO TABS
0.2500 mg | ORAL_TABLET | Freq: Two times a day (BID) | ORAL | Status: DC
  Administered 2023-03-21 (×2): 0.25 mg via ORAL
  Filled 2023-03-20 (×3): qty 1

## 2023-03-20 MED ORDER — ONDANSETRON HCL 4 MG PO TABS: 4.0000 mg | ORAL_TABLET | Freq: Four times a day (QID) | ORAL | Status: DC | PRN

## 2023-03-20 MED ORDER — PANTOPRAZOLE SODIUM 40 MG PO TBEC
40.0000 mg | DELAYED_RELEASE_TABLET | Freq: Two times a day (BID) | ORAL | Status: DC
  Administered 2023-03-21 – 2023-03-29 (×17): 40 mg via ORAL
  Filled 2023-03-20 (×18): qty 1

## 2023-03-20 MED ORDER — MORPHINE SULFATE (PF) 2 MG/ML IV SOLN
1.0000 mg | INTRAVENOUS | Status: DC | PRN
  Administered 2023-03-20 – 2023-03-21 (×2): 1 mg via INTRAVENOUS
  Filled 2023-03-20 (×2): qty 1

## 2023-03-20 MED ORDER — LEVETIRACETAM 500 MG PO TABS: 500.0000 mg | ORAL_TABLET | Freq: Two times a day (BID) | ORAL | Status: DC

## 2023-03-20 MED ORDER — ACETAMINOPHEN 650 MG RE SUPP: 650.0000 mg | Freq: Four times a day (QID) | RECTAL | Status: DC | PRN

## 2023-03-20 MED ORDER — CEFAZOLIN SODIUM-DEXTROSE 1-4 GM/50ML-% IV SOLN
1.0000 g | Freq: Two times a day (BID) | INTRAVENOUS | Status: AC
  Administered 2023-03-21 – 2023-03-23 (×7): 1 g via INTRAVENOUS
  Filled 2023-03-20 (×7): qty 50

## 2023-03-20 NOTE — H&P (Signed)
 History and Physical  Patient: [name] VWU:[id number] DOB: 1945-02-22 DOA: 03/20/2023 DOS: the patient was seen and examined on 03/20/2023 Patient coming from: Home  Chief Complaint: Shortness of breath and leg swelling  HPI: Patient with PMH of CAD with PCI to RCA, recent cath 9/23 with nonobstructive CAD and patent stents, mild aortic stenosis, echo EF 6065% in 2023, HTN, anxiety, chronic pain syndrome, GERD, hypothyroidism, HLD, ILD, CKD 3A presented to the hospital with complaints of leg swelling and shortness of breath. History was obtained with the help of the niece.  As the patient was on BiPAP. Patient was at her baseline early in February.  When they went to see PCP in 2/27 there was some trace edema noted but no significant shortness of breath.  On 3/8, day before the admission when the knees all the patient she was sounding short of breath.  Patient reports that she has progressive decline in her breathing Ability.  She denies any chest pain or chest tightness.  No nausea no vomiting no fever no chills. No diarrhea no constipation. No recent change in medication noted. Patient also receives home physical therapy on a weekly basis and during the last visit there was no concern with regards to her breathing or significant swelling noted. Today niece found the patient with very short of breath and therefore patient was brought to the hospital.  Assessment and Plan: Acute on chronic diastolic CHF. Presents with shortness of breath.  BNP significantly elevated.  Significant swelling in the leg.  Tripoding requiring BiPAP therapy and NRB in the ER. Chest x-ray shows significant vascular congestion.  There is comment about pneumonia although pneumonia is less likely. Echocardiogram 60 to 65% in September 2023. Last cath 09/2338% stenosis and RPDA and second diagonal, otherwise nonobstructive CAD with patent stent in RCA. Received IV Lasix 40 mg.  Will provide further IV Lasix 40 mg twice  daily. Continue BiPAP as needed. Progressive care admission. Requested cardiology consultation. Holding other antihypertensive medications to allow room for aggressive diuresis.  Acute urinary retention. Was unable to urinate on her own with bladder scan showing 400+ cc. With respiratory distress as well as need for aggressive diuresis would place a Foley catheter. Attempt voiding trial in 48 hours.  Possible cellulitis. Possibility of a cellulitis cannot be ruled out given the presentation of leg swelling with redness and edema and pain. Will treat with cefazolin while on BiPAP. Switch to oral Keflex. Short course recommended.  Elevated troponin. History of CAD. Mild aortic stenosis HTN Last 2023.  Nonobstructive CAD with patent stent. Troponins minimally elevated but EKG does not show any evidence of acute ischemia. Patient does have a history of CAD, continue aspirin.   Check echocardiogram. Cardiology consult. There is no indication for therapeutic anticoagulation.  GERD. Continue PPI.  Hypothyroidism. Continuing Synthroid.  Check TSH and free T4.  History of seizures. Continuing Keppra. Will place orders for IV, can switch to p.o. if the patient is able to come off the BiPAP therapy.  Depression. On Cymbalta, currently on hold as unable to swallow due to BiPAP.  HLD. Continuing statin.  Goals of care conversation. Discussed with the patient as well as in presence of niece. Patient would like to continue DNR.   Prolonged QTc. Monitor on telemetry. Minimize QT prolonging medications.   Advance Care Planning:   Code Status: Do not attempt resuscitation (DNR) PRE-ARREST INTERVENTIONS DESIRED  Consults: Cardiology  Prior to Admission medications   Medication Sig Start Date End Date  Taking? Authorizing Provider  acetaminophen (TYLENOL) 325 MG tablet Take 2 tablets (650 mg total) by mouth every 6 (six) hours as needed for mild pain (pain score 1-3) (or Fever >/=  101). 11/26/22   Sheikh, Kateri Mc Latif, DO  albuterol (PROVENTIL) (2.5 MG/3ML) 0.083% nebulizer solution Take 3 mLs (2.5 mg total) by nebulization every 2 (two) hours as needed for wheezing. Patient not taking: Reported on 02/03/2023 11/26/22   Marguerita Merles Latif, DO  ALPRAZolam Prudy Feeler) 0.25 MG tablet Take 1 tablet (0.25 mg total) by mouth 2 (two) times daily. 11/26/22   Sheikh, Omair Latif, DO  ARIPiprazole (ABILIFY) 2 MG tablet Take 2 mg by mouth daily. 02/22/22   [provider]  aspirin EC 81 MG tablet Take 81 mg by mouth daily.    [provider]  calcitRIOL (ROCALTROL) 0.25 MCG capsule Take 0.25 mcg by mouth daily.    [provider]  calcium elemental as carbonate (BARIATRIC TUMS ULTRA) 400 MG chewable tablet Chew 1,000 mg by mouth daily. AS NEEDED PER PT 10/19/19   [provider]  [employer] D3 50 MCG (2000 UT) CAPS Take 2,000 Units by mouth daily. 02/11/18   [provider]  cyanocobalamin 1000 MCG tablet Take 1 tablet (1,000 mcg total) by mouth daily. 11/26/22   Marguerita Merles Latif, DO  diltiazem (CARDIZEM LA) 180 MG 24 hr tablet Take 1 tablet (180 mg total) by mouth daily. 03/02/22   Quintella Reichert, MD  DULoxetine (CYMBALTA) 60 MG capsule Take 30 mg by mouth daily.    [provider]  ferrous sulfate 325 (65 FE) MG tablet Take 325 mg by mouth daily with breakfast. 12/02/15   [provider]  HYDROcodone-acetaminophen (NORCO) 10-325 MG tablet Take 1 tablet by mouth every 6 (six) hours as needed for moderate pain (pain score 4-6) or severe pain (pain score 7-10). 11/26/22   Sheikh, Omair Latif, DO  levETIRAcetam (KEPPRA) 500 MG tablet TAKE 1 TABLET BY MOUTH TWICE A DAY 01/03/23   Butch Penny, NP  levothyroxine (SYNTHROID) 100 MCG tablet Take 88 mcg by mouth every morning. 09/09/21   [provider]  losartan (COZAAR) 25 MG tablet Take 2 tablets (50 mg total) by mouth daily. 11/26/22   Marguerita Merles Latif, DO  magnesium oxide  (MAG-OX) 400 (240 Mg) MG tablet Take 400 mg by mouth 2 (two) times daily.    [provider]  moxifloxacin (VIGAMOX) 0.5 % ophthalmic solution PLEASE SEE ATTACHED FOR DETAILED DIRECTIONS    [provider]  nebivolol (BYSTOLIC) 5 MG tablet Take 0.5 tablets (2.5 mg total) by mouth in the morning and at bedtime. Please keep upcoming appointment in April 2025 for future refills. Thank you 03/07/23   Quintella Reichert, MD  nitroGLYCERIN (NITROSTAT) 0.4 MG SL tablet PLACE 1 TABLET (0.4 MG TOTAL) UNDER THE TONGUE EVERY 5 (FIVE) MINUTES AS NEEDED FOR CHEST PAIN. 07/17/20   Quintella Reichert, MD  ondansetron (ZOFRAN) 4 MG tablet Take 1 tablet (4 mg total) by mouth every 6 (six) hours as needed for nausea. 11/26/22   Sheikh, Omair Latif, DO  pantoprazole (PROTONIX) 40 MG tablet Take 1 tablet (40 mg total) by mouth 2 (two) times daily. 02/07/23   Hilarie Fredrickson, MD  potassium chloride SA (K-DUR,KLOR-CON) 20 MEQ tablet Take 20 mEq by mouth 2 (two) times daily.  09/02/17   [provider]  prednisoLONE acetate (PRED FORTE) 1 % ophthalmic suspension PLEASE SEE ATTACHED FOR DETAILED DIRECTIONS    [provider]  rosuvastatin (CRESTOR) 40 MG tablet Take 1 tablet (40 mg total) by mouth daily. 03/02/22   Quintella Reichert, MD    Past Medical History:  Diagnosis Date   Anxiety    Arthritis    Blood transfusion without reported diagnosis    childhood   Carpal tunnel syndrome, bilateral 07/31/2015   Cataract    Cervical spondylosis without myelopathy    Chronic chest pain    Chronic kidney disease    Chronic low back pain    Chronotropic incompetence    CKD (chronic kidney disease), stage III (HCC)    Coronary artery disease    a. inf-post MI 2011 s/p DES to RCA. b. DES to RCA 06/2010. c. DES to LAD 2014, residual dz treated medically.   Depression    DVT (deep venous thrombosis) (HCC)    left   Esophageal stricture    GERD (gastroesophageal reflux disease)    Gross hematuria     Heart murmur    History of esophageal dilatation    FOR STRIUCTURE   History of gout    History of hiatal hernia    History of kidney stones    Hyperlipidemia    Hypertension    Hypothyroidism    Internal hemorrhoids    Irritable bowel syndrome    Mild aortic stenosis    by echo 2018   Myocardial infarction (HCC)    Obesity    Orthostatic hypotension    Partial seizure disorder (HCC) NEUROLOGIST-- DR Anne Hahn   NOCTURNAL   PONV (postoperative nausea and vomiting)    hard time getting iv site-had to do neck stick 2 yrs ago   Pulmonary fibrosis (HCC)    S/P pericardial cyst excision    02-05-2013  benign   Seizures (HCC)    none in yrs   SVT (supraventricular tachycardia) (HCC)    Trigger finger, acquired 09/30/2015   Right middle finger   Vitamin D deficiency    Weakness    Past Surgical History:  Procedure Laterality Date   BIOPSY OF MEDIASTINAL MASS N/A 02/05/2013   Procedure: RESECTION OF MEDIASTINAL MASS;  Surgeon: Kerin Perna, MD;  Location: [facility] OR;  Service: Thoracic;  Laterality: N/A;   CARDIAC CATHETERIZATION Right 01/24/2015   Procedure: right femoral ARTERIAL LINE INSERTION;  Surgeon: Kerin Perna, MD;  Location: [facility] OR;  Service: Thoracic;  Laterality: Right;   CARDIOVASCULAR STRESS TEST  11-22-2013  dr Eldridge Dace   normal lexiscan study/  no ischemia/  normal LVF and wall motion/ ef 81%   COLONOSCOPY  last one 2014   CORONARY ANGIOGRAM  03/10/2011   Procedure: CORONARY ANGIOGRAM;  Surgeon: Corky Crafts, MD;  Location: [facility] CATH LAB;  Service: Cardiovascular;;   CORONARY ANGIOPLASTY WITH STENT PLACEMENT  11-22-2009  dr Eldridge Dace   Acute inferoposterior MI/  ef 60%,  PCI with DES x1 to dRCA,  25%  mLAD   CORONARY ANGIOPLASTY WITH STENT PLACEMENT  06-26-2010  dr Verdis Prime   Cutting balloon angioplasty to dRCA with DES x1,  40% mLAD (non-obstructive CAD)   CYSTOSCOPY WITH RETROGRADE PYELOGRAM, URETEROSCOPY AND STENT PLACEMENT Right 02/13/2014   Procedure:  CYSTOSCOPY WITH RETROGRADE PYELOGRAM, URETEROSCOPY AND STENT PLACEMENT;  Surgeon: Valetta Fuller, MD;  Location: [facility];  Service: Urology;  Laterality: Right;   ECTOPIC PREGNANCY SURGERY  YRS AGO   SALPINGECTOMY   ESOPHAGOGASTRODUODENOSCOPY N/A 02/15/2014   Procedure: ESOPHAGOGASTRODUODENOSCOPY (EGD);  Surgeon: Beverley Fiedler, MD;  Location:  [facility] ENDOSCOPY;  Service: Endoscopy;  Laterality: N/A;   ESOPHAGOGASTRODUODENOSCOPY (EGD) WITH ESOPHAGEAL DILATION  05-20-2011   HOLMIUM LASER APPLICATION Right 02/13/2014   Procedure: HOLMIUM LASER APPLICATION;  Surgeon: Valetta Fuller, MD;  Location: [facility];  Service: Urology;  Laterality: Right;   LEFT HEART CATH AND CORONARY ANGIOGRAPHY N/A 09/12/2019   Procedure: LEFT HEART CATH AND CORONARY ANGIOGRAPHY;  Surgeon: Corky Crafts, MD;  Location: [facility] INVASIVE CV LAB;  Service: Cardiovascular;  Laterality: N/A;   LEFT HEART CATH AND CORONARY ANGIOGRAPHY N/A 09/16/2021   Procedure: LEFT HEART CATH AND CORONARY ANGIOGRAPHY;  Surgeon: Swazil[place], Peter M, MD;  Location: [facility] INVASIVE CV LAB;  Service: Cardiovascular;  Laterality: N/A;   LEFT HEART CATHETERIZATION WITH CORONARY ANGIOGRAM N/A 03/14/2012   Procedure: LEFT HEART CATHETERIZATION WITH CORONARY ANGIOGRAM;  Surgeon: Quintella Reichert, MD;  Location: [facility] CATH LAB;  Service: Cardiovascular;  Laterality: N/A;  Normal LM,  50% pLAD,  70% mLAD,  D2 50-70%, very tortuous LAD,  70-82% ostial LCFX,  50% in-stent restenosis of  dRCA and mRCA  stent and 50-70% ostial PDA,  normal LVSF, ef 60%   LUNG BIOPSY Right 01/24/2015   Procedure: RIGHT LUNG BIOPSY;  Surgeon: Kerin Perna, MD;  Location: [facility] OR;  Service: Thoracic;  Laterality: Right;   MEDIASTERNOTOMY N/A 02/05/2013   Procedure: MEDIAN STERNOTOMY;  Surgeon: Kerin Perna, MD;  Location: [facility] OR;  Service: Thoracic;  Laterality: N/A;   MEDIASTERNOTOMY Left 02/05/2013   NEEDLE GUIDED EXCISION BREAST CALCIFICATIONS Right 08-09-2008    PERCUTANEOUS CORONARY STENT INTERVENTION (PCI-S) N/A 04/03/2012   Procedure: PERCUTANEOUS CORONARY STENT INTERVENTION (PCI-S);  Surgeon: Corky Crafts, MD;  Location: [facility] CATH LAB;  Service: Cardiovascular;  Laterality: N/A;   Successful PCI  mLAD with 2.75x12 Promus stent, postdilated to >0.65mm   THORACOTOMY Right 01/24/2015   Procedure: RIGHT MINI/LIMITED THORACOTOMY;  Surgeon: Kerin Perna, MD;  Location: [facility] OR;  Service: Thoracic;  Laterality: Right;   TRANSTHORACIC ECHOCARDIOGRAM  11-17-2011   grade I diastolic dysfunction/  ef 55-60%   VIDEO BRONCHOSCOPY N/A 01/24/2015   Procedure: RIGHT VIDEO BRONCHOSCOPY;  Surgeon: Kerin Perna, MD;  Location: [facility] OR;  Service: Thoracic;  Laterality: N/A;   Social History:  reports that she quit smoking about 55 years ago. Her smoking use included cigarettes. She started smoking about 58 years ago. She has a 0.9 pack-year smoking history. She has never used smokeless tobacco. She reports that she does not drink alcohol and does not use drugs. No Known Allergies Family History  Problem Relation Age of Onset   Heart attack Mother    CVA Mother    Heart disease Father    Hypertension Father    Emphysema Father    Coronary artery disease Father    Diabetes Mellitus I Father    Breast cancer Sister    Breast cancer Niece        niece   Colon cancer Neg Hx    Stomach cancer Neg Hx    Esophageal cancer Neg Hx    Rectal cancer Neg Hx    Seizures Neg Hx    Physical Exam: Vitals:   03/20/23 1628 03/20/23 1630 03/20/23 1730 03/20/23 1733  BP:  (!) 133/98 (!) 95/51 114/73  Pulse:   76 (!) 114  Resp:  (!) 29 18 (!) 23  Temp: 98.2 F (36.8 C)     TempSrc: Axillary     SpO2:   90%  Weight:      Height:       General: Appear in marked distress; no visible Abnormal Neck Mass Or lumps, Conjunctiva normal Cardiovascular: S1 and S2 Present, no Murmur, Respiratory: increased respiratory effort, Bilateral Air entry present and bilateral   Crackles, Occasional  wheezes Abdomen: Bowel Sound present, Non tender  Extremities: bilateral Pedal edema with redness Neurology: alert and oriented to time, place, and person Gait not checked due to patient safety concerns    Data Reviewed: I have reviewed ED notes, Vitals, Lab results and outpatient records. Since last encounter, pertinent lab results CBC and BMP   . I have ordered test including CBC, BMP, TSH, free T4. I have discussed pt's care plan and test results with EDP. I have ordered imaging echocardiogram. I have independently visualized and interpreted imaging chest x-ray which showed severe vascular congestion. I have independently visualized and interpreted EKG which showed EKG: normal sinus rhythm, nonspecific ST and T waves changes.   Family Communication: Niece was at bedside.  Author: Lynden Oxford, MD 03/20/2023 5:56 PM For on call review [email or web address].

## 2023-03-20 NOTE — ED Notes (Signed)
 Emergency contact Artis Delay) requests a phone call when pt his given a room.

## 2023-03-20 NOTE — ED Triage Notes (Signed)
 Past 2 weeks progressive SOB with bilateral leg swelling, pitting edema, redness. Pt family noticed she had labored breathing today. Lung sounds -fine crackles in upper lobes per EMS. No pain. Alert and orientated x4.   40-50 R.  8L 96% 19-23 cap 83 HR 114/76  192 CBG 12 lead unremarkable   CKD CHF HTN

## 2023-03-20 NOTE — Hospital Course (Addendum)
 Patient with PMH of CAD with PCI to RCA, recent cath 9/23 with nonobstructive CAD and patent stents, mild aortic stenosis, echo EF 6065% in 2023, HTN, anxiety, chronic pain syndrome, GERD, hypothyroidism, HLD, ILD, CKD 3A presented to the hospital with complaints of leg swelling and shortness of breath. Went to see PCP in 2/27 there was some trace edema noted but no significant shortness of breath.   niece found the patient with very short of breath and therefore patient was brought to the hospital. Currently being treated for acute on chronic HFpEF.  Assessment and Plan: Acute on chronic diastolic CHF. Presents with shortness of breath.  BNP significantly elevated.  Significant swelling in the leg.  Tripoding requiring BiPAP therapy and NRB in the ER. Chest x-ray shows significant vascular congestion.  There is comment about pneumonia although pneumonia is less likely. Echocardiogram 60 to 65% in September 2023. Last cath 9/23 40% stenosis and RPDA and second diagonal, otherwise nonobstructive CAD with patent stent in RCA. Continue with IV Lasix. Continue BiPAP as needed. Will follow cardiology recommendation. 1 close appears to be improved significantly.  Patient on 4 LPM right now. Holding other antihypertensive medications to allow room for aggressive diuresis.  Acute urinary retention. Was unable to urinate on her own with bladder scan showing 400+ cc. With respiratory distress as well as need for aggressive diuresis would place a Foley catheter. Attempt voiding trial in 48 hours.  Possible cellulitis. Possibility of a cellulitis cannot be ruled out given the presentation of leg swelling with redness and edema and pain. Will treat with cefazolin while on BiPAP. Switch to oral Keflex when successfully can take p.o. Short course recommended.  Elevated troponin. History of CAD. Mild aortic stenosis HTN Last 2023.  Nonobstructive CAD with patent stent. Troponins minimally elevated  but EKG does not show any evidence of acute ischemia. Patient does have a history of CAD, continue aspirin.   Check echocardiogram. Cardiology consult. There is no indication for therapeutic anticoagulation.  Paroxysmal atrial fibrillation with RVR. History of SVT. Patient has history of SVT and is on Cardizem. Cardizem was on hold on admission to allow room for diuresis. Found to have A-fib with RVR early in the morning of 3/10. IV Lopressor given.  Resume Bystolic.  Pending echocardiogram resume Cardizem. RVR could be the cause of her heart failure.  GERD. Continue PPI.  Hypothyroidism. Continuing Synthroid.  Both TSH and free T4 elevated. In the presence of RVR I will reduce the dose of the Synthroid to 75 mcg from 88.  History of seizures. Continuing Keppra. Will place orders for IV, can switch to p.o. if the patient is able to come off the BiPAP therapy.  Depression. On Cymbalta, currently on hold as unable to swallow due to BiPAP.  HLD. Continuing statin.  Goals of care conversation. Discussed with the patient as well as in presence of niece. Patient would like to continue DNR.   Prolonged QTc. Monitor on telemetry. Minimize QT prolonging medications.

## 2023-03-20 NOTE — Progress Notes (Signed)
 RT called at bedside by RN. Upon assessment Pt in tripod position with labored breathing. Bilateral course breath sounds were auscultated. Pt was placed on BIPAP 13/5 60% and is tolerating well at this time. RT will continue respiratory care

## 2023-03-20 NOTE — ED Provider Notes (Signed)
 [place] EMERGENCY DEPARTMENT AT [facility] Provider Note   CSN: [id number] Arrival date & time: 03/20/23  1303     History {Add pertinent medical, surgical, social history, OB history to HPI:1} Chief Complaint  Patient presents with  . Shortness of Breath    [name] is a 78 y.o. female.   Shortness of Breath    Patient has a history of coronary artery disease hypertension hyperlipidemia PS VT hypothyroidism seizures depression acid reflux irritable bowel syndrome renal insufficiency, chronic respiratory failure, interstitial lung disease.  Patient presents to the ED with complaints of shortness of breath.  Patient tourists self declined having any chronic lung issues.  Over the last few days she has had increasing difficulty breathing.  Family states she even mentioned to trouble with her breathing a couple weeks ago.  She saw her primary doctor.  They had not noticed the leg swelling until more recently.  Patient states she cannot  Home Medications Prior to Admission medications   Medication Sig Start Date End Date Taking? Authorizing Provider  acetaminophen (TYLENOL) 325 MG tablet Take 2 tablets (650 mg total) by mouth every 6 (six) hours as needed for mild pain (pain score 1-3) (or Fever >/= 101). 11/26/22   Sheikh, Kateri Mc Latif, DO  albuterol (PROVENTIL) (2.5 MG/3ML) 0.083% nebulizer solution Take 3 mLs (2.5 mg total) by nebulization every 2 (two) hours as needed for wheezing. Patient not taking: Reported on 02/03/2023 11/26/22   Marguerita Merles Latif, DO  ALPRAZolam Prudy Feeler) 0.25 MG tablet Take 1 tablet (0.25 mg total) by mouth 2 (two) times daily. 11/26/22   Sheikh, Omair Latif, DO  ARIPiprazole (ABILIFY) 2 MG tablet Take 2 mg by mouth daily. 02/22/22   [provider]  aspirin EC 81 MG tablet Take 81 mg by mouth daily.    [provider]  calcitRIOL (ROCALTROL) 0.25 MCG capsule Take 0.25 mcg by mouth daily.    [provider]  calcium  elemental as carbonate (BARIATRIC TUMS ULTRA) 400 MG chewable tablet Chew 1,000 mg by mouth daily. AS NEEDED PER PT 10/19/19   [provider]  [employer] D3 50 MCG (2000 UT) CAPS Take 2,000 Units by mouth daily. 02/11/18   [provider]  cyanocobalamin 1000 MCG tablet Take 1 tablet (1,000 mcg total) by mouth daily. 11/26/22   Marguerita Merles Latif, DO  diltiazem (CARDIZEM LA) 180 MG 24 hr tablet Take 1 tablet (180 mg total) by mouth daily. 03/02/22   Quintella Reichert, MD  DULoxetine (CYMBALTA) 60 MG capsule Take 30 mg by mouth daily.    [provider]  ferrous sulfate 325 (65 FE) MG tablet Take 325 mg by mouth daily with breakfast. 12/02/15   [provider]  HYDROcodone-acetaminophen (NORCO) 10-325 MG tablet Take 1 tablet by mouth every 6 (six) hours as needed for moderate pain (pain score 4-6) or severe pain (pain score 7-10). 11/26/22   Sheikh, Omair Latif, DO  levETIRAcetam (KEPPRA) 500 MG tablet TAKE 1 TABLET BY MOUTH TWICE A DAY 01/03/23   Butch Penny, NP  levothyroxine (SYNTHROID) 100 MCG tablet Take 88 mcg by mouth every morning. 09/09/21   [provider]  losartan (COZAAR) 25 MG tablet Take 2 tablets (50 mg total) by mouth daily. 11/26/22   Marguerita Merles Latif, DO  magnesium oxide (MAG-OX) 400 (240 Mg) MG tablet Take 400 mg by mouth 2 (two) times daily.    [provider]  moxifloxacin (VIGAMOX) 0.5 % ophthalmic solution PLEASE  SEE ATTACHED FOR DETAILED DIRECTIONS    [provider]  nebivolol (BYSTOLIC) 5 MG tablet Take 0.5 tablets (2.5 mg total) by mouth in the morning and at bedtime. Please keep upcoming appointment in April 2025 for future refills. Thank you 03/07/23   Quintella Reichert, MD  nitroGLYCERIN (NITROSTAT) 0.4 MG SL tablet PLACE 1 TABLET (0.4 MG TOTAL) UNDER THE TONGUE EVERY 5 (FIVE) MINUTES AS NEEDED FOR CHEST PAIN. 07/17/20   Quintella Reichert, MD  ondansetron (ZOFRAN) 4 MG tablet Take 1 tablet (4 mg total) by mouth every 6  (six) hours as needed for nausea. 11/26/22   Sheikh, Omair Latif, DO  pantoprazole (PROTONIX) 40 MG tablet Take 1 tablet (40 mg total) by mouth 2 (two) times daily. 02/07/23   Hilarie Fredrickson, MD  potassium chloride SA (K-DUR,KLOR-CON) 20 MEQ tablet Take 20 mEq by mouth 2 (two) times daily.  09/02/17   [provider]  prednisoLONE acetate (PRED FORTE) 1 % ophthalmic suspension PLEASE SEE ATTACHED FOR DETAILED DIRECTIONS    [provider]  rosuvastatin (CRESTOR) 40 MG tablet Take 1 tablet (40 mg total) by mouth daily. 03/02/22   Quintella Reichert, MD      Allergies    Patient has no known allergies.    Review of Systems   Review of Systems  Respiratory:  Positive for shortness of breath.     Physical Exam Updated Vital Signs BP (!) 140/105 (BP Location: Right Arm)   Temp 98 F (36.7 C) (Axillary)   Resp (!) 30   Ht 1.524 m (5')   Wt 49.9 kg   SpO2 92%   BMI 21.48 kg/m  Physical Exam Vitals and nursing note reviewed.  Constitutional:      General: She is not in acute distress.    Appearance: She is well-developed.  HENT:     Head: Normocephalic and atraumatic.     Right Ear: External ear normal.     Left Ear: External ear normal.  Eyes:     General: No scleral icterus.       Right eye: No discharge.        Left eye: No discharge.     Conjunctiva/sclera: Conjunctivae normal.  Neck:     Trachea: No tracheal deviation.  Cardiovascular:     Rate and Rhythm: Normal rate and regular rhythm.  Pulmonary:     Effort: Pulmonary effort is normal. No respiratory distress.     Breath sounds: Normal breath sounds. No stridor. No wheezing or rales.  Abdominal:     General: Bowel sounds are normal. There is no distension.     Palpations: Abdomen is soft.     Tenderness: There is no abdominal tenderness. There is no guarding or rebound.  Musculoskeletal:        General: No tenderness or deformity.     Cervical back: Neck supple.     Right lower leg: Edema present.      Left lower leg: Edema present.     Comments: 3+ pitting edema bilateral lower extremities  Skin:    General: Skin is warm and dry.     Findings: No rash.  Neurological:     General: No focal deficit present.     Mental Status: She is alert.     Cranial Nerves: No cranial nerve deficit, dysarthria or facial asymmetry.     Sensory: No sensory deficit.     Motor: No abnormal muscle tone or seizure activity.  Coordination: Coordination normal.  Psychiatric:        Mood and Affect: Mood normal.     ED Results / Procedures / Treatments   Labs (all labs ordered are listed, but only abnormal results are displayed) Labs Reviewed  CBC - Abnormal; Notable for the following components:      Result Value   WBC 15.6 (*)    RBC 3.65 (*)    Hemoglobin 10.6 (*)    HCT 34.1 (*)    All other components within normal limits  BASIC METABOLIC PANEL  BRAIN NATRIURETIC PEPTIDE    EKG None  Radiology DG Chest Portable 1 View Result Date: 03/20/2023 CLINICAL DATA:  Shortness of breath. EXAM: PORTABLE CHEST 1 VIEW COMPARISON:  November 18, 2022. FINDINGS: Stable cardiomediastinal silhouette. Sternotomy wires are noted. Diffuse interstitial densities are noted throughout both lungs concerning for pulmonary edema or possibly multifocal pneumonia. Small pleural effusions are noted. Bony thorax is unremarkable. IMPRESSION: Diffuse bilateral lung opacities concerning for edema or pneumonia. Electronically Signed   By: Lupita Raider M.D.   On: 03/20/2023 13:51    Procedures Procedures  {Document cardiac monitor, telemetry assessment procedure when appropriate:1}  Medications Ordered in ED Medications  albuterol (VENTOLIN HFA) 108 (90 Base) MCG/ACT inhaler 2 puff (has no administration in time range)    ED Course/ Medical Decision Making/ A&P Clinical Course as of 03/20/23 1404  Sun Mar 20, 2023  1359 Chest x-ray shows diffuse lung opacities concerning for edema or pneumonia.  Clinically  presentation more suggestive of edema [JK]  1403 CBC(!) CBC shows leukocytosis, anemia slightly increased [JK]    Clinical Course User Index [JK] Linwood Dibbles, MD   {   Click here for ABCD2, HEART and other calculatorsREFRESH Note before signing :1}                              Medical Decision Making Amount and/or Complexity of Data Reviewed Labs: ordered. Radiology: ordered.  Risk Prescription drug management.   ***  {Document critical care time when appropriate:1} {Document review of labs and clinical decision tools ie heart score, Chads2Vasc2 etc:1}  {Document your independent review of radiology images, and any outside records:1} {Document your discussion with family members, caretakers, and with consultants:1} {Document social determinants of health affecting pt's care:1} {Document your decision making why or why not admission, treatments were needed:1} Final Clinical Impression(s) / ED Diagnoses Final diagnoses:  None    Rx / DC Orders ED Discharge Orders     None

## 2023-03-21 ENCOUNTER — Inpatient Hospital Stay (HOSPITAL_COMMUNITY)

## 2023-03-21 ENCOUNTER — Encounter (HOSPITAL_COMMUNITY): Payer: Self-pay | Admitting: Internal Medicine

## 2023-03-21 DIAGNOSIS — I5033 Acute on chronic diastolic (congestive) heart failure: Secondary | ICD-10-CM

## 2023-03-21 LAB — CBC
HCT: 29.9 % — ABNORMAL LOW (ref 36.0–46.0)
Hemoglobin: 9.5 g/dL — ABNORMAL LOW (ref 12.0–15.0)
MCH: 29.5 pg (ref 26.0–34.0)
MCHC: 31.8 g/dL (ref 30.0–36.0)
MCV: 92.9 fL (ref 80.0–100.0)
Platelets: 256 10*3/uL (ref 150–400)
RBC: 3.22 MIL/uL — ABNORMAL LOW (ref 3.87–5.11)
RDW: 14 % (ref 11.5–15.5)
WBC: 16.4 10*3/uL — ABNORMAL HIGH (ref 4.0–10.5)
nRBC: 0 % (ref 0.0–0.2)

## 2023-03-21 LAB — COMPREHENSIVE METABOLIC PANEL
ALT: 26 U/L (ref 0–44)
AST: 40 U/L (ref 15–41)
Albumin: 2.7 g/dL — ABNORMAL LOW (ref 3.5–5.0)
Alkaline Phosphatase: 72 U/L (ref 38–126)
Anion gap: 13 (ref 5–15)
BUN: 42 mg/dL — ABNORMAL HIGH (ref 8–23)
CO2: 20 mmol/L — ABNORMAL LOW (ref 22–32)
Calcium: 6.8 mg/dL — ABNORMAL LOW (ref 8.9–10.3)
Chloride: 106 mmol/L (ref 98–111)
Creatinine, Ser: 1.42 mg/dL — ABNORMAL HIGH (ref 0.44–1.00)
GFR, Estimated: 38 mL/min — ABNORMAL LOW (ref >60)
Glucose, Bld: 94 mg/dL (ref 70–99)
Potassium: 3.3 mmol/L — ABNORMAL LOW (ref 3.5–5.1)
Sodium: 139 mmol/L (ref 135–145)
Total Bilirubin: 1.5 mg/dL — ABNORMAL HIGH (ref 0.0–1.2)
Total Protein: 5.7 g/dL — ABNORMAL LOW (ref 6.5–8.1)

## 2023-03-21 LAB — ECHOCARDIOGRAM COMPLETE
AR max vel: 1.25 cm2
AV Area VTI: 1.1 cm2
AV Area mean vel: 1.17 cm2
AV Mean grad: 8 mmHg
AV Peak grad: 14.2 mmHg
Ao pk vel: 1.89 m/s
Area-P 1/2: 6.13 cm2
Height: 60 in
MV VTI: 2.46 cm2
S' Lateral: 2.2 cm
Weight: 1760 [oz_av]

## 2023-03-21 LAB — TSH: TSH: 6.328 u[IU]/mL — ABNORMAL HIGH (ref 0.350–4.500)

## 2023-03-21 LAB — PROCALCITONIN: Procalcitonin: 0.72 ng/mL

## 2023-03-21 LAB — MAGNESIUM: Magnesium: 1 mg/dL — ABNORMAL LOW (ref 1.7–2.4)

## 2023-03-21 LAB — T4, FREE: Free T4: 1.48 ng/dL — ABNORMAL HIGH (ref 0.61–1.12)

## 2023-03-21 LAB — GLUCOSE, CAPILLARY: Glucose-Capillary: 115 mg/dL — ABNORMAL HIGH (ref 70–99)

## 2023-03-21 MED ORDER — HEPARIN (PORCINE) 25000 UT/250ML-% IV SOLN
900.0000 [IU]/h | INTRAVENOUS | Status: DC
  Administered 2023-03-21: 700 [IU]/h via INTRAVENOUS
  Administered 2023-03-22 – 2023-03-24 (×3): 900 [IU]/h via INTRAVENOUS
  Filled 2023-03-21 (×4): qty 250

## 2023-03-21 MED ORDER — METOPROLOL TARTRATE 5 MG/5ML IV SOLN
5.0000 mg | INTRAVENOUS | Status: DC | PRN
  Administered 2023-03-27: 5 mg via INTRAVENOUS
  Filled 2023-03-21: qty 5

## 2023-03-21 MED ORDER — POTASSIUM CHLORIDE CRYS ER 20 MEQ PO TBCR
40.0000 meq | EXTENDED_RELEASE_TABLET | Freq: Once | ORAL | Status: AC
  Administered 2023-03-21: 40 meq via ORAL
  Filled 2023-03-21: qty 2

## 2023-03-21 MED ORDER — LEVOTHYROXINE SODIUM 75 MCG PO TABS
75.0000 ug | ORAL_TABLET | Freq: Every morning | ORAL | Status: DC
Start: 2023-03-22 — End: 2023-03-22
  Administered 2023-03-22: 75 ug via ORAL
  Filled 2023-03-21: qty 1

## 2023-03-21 MED ORDER — AMIODARONE HCL IN DEXTROSE 360-4.14 MG/200ML-% IV SOLN
60.0000 mg/h | INTRAVENOUS | Status: DC
  Administered 2023-03-21: 60 mg/h via INTRAVENOUS
  Filled 2023-03-21: qty 200

## 2023-03-21 MED ORDER — DILTIAZEM LOAD VIA INFUSION
10.0000 mg | Freq: Once | INTRAVENOUS | Status: AC
  Administered 2023-03-21: 10 mg via INTRAVENOUS
  Filled 2023-03-21: qty 10

## 2023-03-21 MED ORDER — CHLORHEXIDINE GLUCONATE CLOTH 2 % EX PADS
6.0000 | MEDICATED_PAD | Freq: Every day | CUTANEOUS | Status: DC
  Administered 2023-03-21 – 2023-03-29 (×9): 6 via TOPICAL

## 2023-03-21 MED ORDER — METOPROLOL TARTRATE 5 MG/5ML IV SOLN
5.0000 mg | Freq: Once | INTRAVENOUS | Status: AC
  Administered 2023-03-21: 5 mg via INTRAVENOUS
  Filled 2023-03-21: qty 5

## 2023-03-21 MED ORDER — DILTIAZEM HCL-DEXTROSE 125-5 MG/125ML-% IV SOLN (PREMIX)
5.0000 mg/h | INTRAVENOUS | Status: DC
Start: 2023-03-21 — End: 2023-03-21
  Administered 2023-03-21: 5 mg/h via INTRAVENOUS
  Filled 2023-03-21: qty 125

## 2023-03-21 MED ORDER — MIDODRINE HCL 5 MG PO TABS
10.0000 mg | ORAL_TABLET | ORAL | Status: AC
  Administered 2023-03-21: 10 mg via ORAL
  Filled 2023-03-21: qty 2

## 2023-03-21 MED ORDER — AMIODARONE HCL IN DEXTROSE 360-4.14 MG/200ML-% IV SOLN
30.0000 mg/h | INTRAVENOUS | Status: DC
  Administered 2023-03-21 – 2023-03-29 (×15): 30 mg/h via INTRAVENOUS
  Filled 2023-03-21 (×15): qty 200

## 2023-03-21 MED ORDER — FUROSEMIDE 10 MG/ML IJ SOLN: 40.0000 mg | Freq: Every day | INTRAMUSCULAR | Status: DC

## 2023-03-21 MED ORDER — MAGNESIUM SULFATE 4 GM/100ML IV SOLN
4.0000 g | Freq: Once | INTRAVENOUS | Status: AC
  Administered 2023-03-21: 4 g via INTRAVENOUS
  Filled 2023-03-21: qty 100

## 2023-03-21 NOTE — ED Notes (Signed)
 Echo at bedside

## 2023-03-21 NOTE — Progress Notes (Signed)
   03/21/23 1652  Assess: MEWS Score  Temp (!) 97.4 F (36.3 C)  BP 97/61  MAP (mmHg) 74  Pulse Rate (!) 110  ECG Heart Rate (!) 113  Resp (!) 21  Level of Consciousness Alert  SpO2 91 %  O2 Device Nasal Cannula  O2 Flow Rate (L/min) 4 L/min  Assess: MEWS Score  MEWS Temp 0  MEWS Systolic 1  MEWS Pulse 2  MEWS RR 1  MEWS LOC 0  MEWS Score 4  MEWS Score Color Red  Assess: if the MEWS score is Yellow or Red  Were vital signs accurate and taken at a resting state? Yes  Does the patient meet 2 or more of the SIRS criteria? Yes  Does the patient have a confirmed or suspected source of infection? No  MEWS guidelines implemented  Yes, red  Treat  MEWS Interventions Considered administering scheduled or prn medications/treatments as ordered  Take Vital Signs  Increase Vital Sign Frequency  Red: Q1hr x2, continue Q4hrs until patient remains green for 12hrs  Escalate  MEWS: Escalate Red: Discuss with charge nurse and notify provider. Consider notifying RRT. If remains red for 2 hours consider need for higher level of care  Notify: Charge Nurse/RN  Name of Charge Nurse/RN Notified myself  Assess: SIRS CRITERIA  SIRS Temperature  0  SIRS Respirations  1  SIRS Pulse 1  SIRS WBC 0  SIRS Score Sum  2

## 2023-03-21 NOTE — ED Notes (Signed)
 Pt transitioned to hospital bed for better comfort.

## 2023-03-21 NOTE — Progress Notes (Signed)
 PHARMACY - ANTICOAGULATION CONSULT NOTE  Pharmacy Consult for heparin  Indication: atrial fibrillation  No Known Allergies  Patient Measurements: Height: 5' (152.4 cm) Weight: 49.9 kg (110 lb) IBW/kg (Calculated) : 45.5 Heparin Dosing Weight: 49.9kg   Vital Signs: Temp: 97.9 F (36.6 C) (03/10 1130) Temp Source: Oral (03/10 1130) BP: 89/66 (03/10 1445) Pulse Rate: 143 (03/10 1445)  Labs: Recent Labs    03/20/23 1336 03/20/23 1545 03/21/23 0407  HGB 10.6*  --  9.5*  HCT 34.1*  --  29.9*  PLT 252  --  256  CREATININE 1.15*  --  1.42*  TROPONINIHS 72* 88*  --     Estimated Creatinine Clearance: 24.2 mL/min (A) (by C-G formula based on SCr of 1.42 mg/dL (H)).   Medical History: Past Medical History:  Diagnosis Date   Anxiety    Arthritis    Blood transfusion without reported diagnosis    childhood   Carpal tunnel syndrome, bilateral 07/31/2015   Cataract    Cervical spondylosis without myelopathy    Chronic chest pain    Chronic kidney disease    Chronic low back pain    Chronotropic incompetence    CKD (chronic kidney disease), stage III (HCC)    Coronary artery disease    a. inf-post MI 2011 s/p DES to RCA. b. DES to RCA 06/2010. c. DES to LAD 2014, residual dz treated medically.   Depression    DVT (deep venous thrombosis) (HCC)    left   Esophageal stricture    GERD (gastroesophageal reflux disease)    Gross hematuria    Heart murmur    History of esophageal dilatation    FOR STRIUCTURE   History of gout    History of hiatal hernia    History of kidney stones    Hyperlipidemia    Hypertension    Hypothyroidism    Internal hemorrhoids    Irritable bowel syndrome    Mild aortic stenosis    by echo 2018   Myocardial infarction (HCC)    Obesity    Orthostatic hypotension    Partial seizure disorder (HCC) NEUROLOGIST-- DR Anne Hahn   NOCTURNAL   PONV (postoperative nausea and vomiting)    hard time getting iv site-had to do neck stick 2 yrs ago    Pulmonary fibrosis (HCC)    S/P pericardial cyst excision    02-05-2013  benign   Seizures (HCC)    none in yrs   SVT (supraventricular tachycardia) (HCC)    Trigger finger, acquired 09/30/2015   Right middle finger   Vitamin D deficiency    Weakness     Assessment: Patient admitted for CC of SOB with leg swelling. Patient with hx of SVT, now in Afib with RVR starting 3/10 AM. Not on anticoagulation PTA. HgB 9.6 and PLTs 256.   Goal of Therapy:  Heparin level 0.3-0.7 units/ml Monitor platelets by anticoagulation protocol: Yes   Plan:  No bolus as patient has received LMWH today. Will d/c order.  Start heparin infusion at 700 units/hr Check anti-Xa level in 8 hours and daily while on heparin Continue to monitor H&H and platelets  Estill Batten, PharmD, BCCCP  03/21/2023,2:52 PM

## 2023-03-21 NOTE — ED Notes (Signed)
MD Patel at bedside.

## 2023-03-21 NOTE — ED Notes (Signed)
 Awaiting Cardizem infusion from main pharmacy.

## 2023-03-21 NOTE — Progress Notes (Addendum)
 Transition of Care [facility]) - Inpatient Brief Assessment   Patient Details  Name: BONETA STANDRE MRN: [id number] Date of Birth: Nov 18, 1945  Transition of Care [facility] CM/SW Contact:    Oletta Cohn, RN Phone Number: 03/21/2023, 10:00 AM   Clinical Narrative: RNCM met with pt at bedside regarding discharge plan.  Pt gave permission to speak with niece, [name] as to what is best.  RNCM contacted [name] [phone]), who is concerned with pt retuning home alone.  [name] provides transportation and supports pt with any additional needs but feels pt is becoming more unsafe in her home alone.  [name] would like to see pt return to rehab facility and possible transition to long term care.   Transition of Care Asessment: Insurance and Status: Insurance coverage has been reviewed Patient has primary care physician: Yes Home environment has been reviewed: (P) home alone (niece, [name] 15 minutes away) Prior level of function:: (P) independent with rolling walker and shower bench in the home Prior/Current Home Services: (P) No current home services Social Drivers of Health Review: (P) SDOH reviewed no interventions necessary Readmission risk has been reviewed: (P) Yes Transition of care needs: (P) transition of care needs identified, TOC will continue to follow

## 2023-03-21 NOTE — Consult Note (Addendum)
 Cardiology Consultation   Patient ID: [name] MRN: [id number]; DOB: Sep 26, 1945  Admit date: 03/20/2023 Date of Consult: 03/21/2023  PCP:  Merri Brunette, MD   [facility] Providers Cardiologist:  Armanda Magic, MD     Patient Profile:   [name] is a 78 y.o. female with a hx of heart failure with preserved ejection fraction, coronary artery disease s/p multiple interventions (DES to RCA in 2011 for MI, DES to RCA on 06/2010 for chest pain, DES to LAD in 2014 for chest pain), hypertension, hyperlipidemia, aortic stenosis, non-cardiac chest pain mainly due to esophageal strictures s/p dilation, GERD, hypothyroidism, renal insufficiency, chronic respiratory failure, interstitial lung disease, DVT in 2019, pericardial cyst s/p resection and dyslipidemia who is being seen 03/21/2023 for the evaluation of shortness of breath at the request of Lynden Oxford MD.  History of Present Illness:   Prior cardiac history coronary artery disease s/p DES to RCA in 2011 for MI, DES to RCA on 06/2010 for chest pain, DES to LAD in 2014 for chest pain.  Had a cardiopulmonary stress test on 01/26/2016 it found moderate to severe functional impairment that matched sedentary norms. The test was limited due to chronotropic incompetence and physical deconditioning. The bystolic and amlodipine were held for a period of time but were later restarted.  Most recent left heart cath was on 09/16/21 and showed 40% stenosis of the 2nd diagonal and 40% stenosis of the RPDA, the prior stents are patient. There was no changes to prior LHC in 2021.  Most recent echo on 09/2021 showed an LVEF of 60-65%. There was no regional wall motion abnormalities, and mild concentric LVH with an elevated left atrial pressure. Mild aortic valve stenosis.  [name] presented to the emergency department on 3/9 for worsening shortness of breath and lower extremity swelling. Over the past few days. Labs in the ER showed an  elevated BNP of 1056, potassium 4.3, sodium 140, CO2 was decreased at 15, BUN elevated at 42, Creatinine 1.15 (baseline about 1.40), WBC was elevated at 15.6, Hemoglobin decreased at 10.6, Chest x-ray showed bilateral lung opacities that were concerning for pulmonary edema or multifocal pneumonia. Procalcitonin today was normal at 0.72. Troponin's were elevated 72>88. An EKG today at 10 am showed atrial fibrillation with RVR and a rate of 148. TSH today was elevated at 6.328 and direct T4 was elevated at 1.48.  On physical exam the patient affirmed the above history. She reported having worsening shortness of breath for 2 weeks and lower extremity edema for 1 week. Has not previously had atrial fibrillation. Reported also having palpitations, fatigue, and orthopnea. Denies chest pain, diaphoresis, cough, fever, hematuria, hematochezia, or melena.     Past Medical History:  Diagnosis Date   Anxiety    Arthritis    Blood transfusion without reported diagnosis    childhood   Carpal tunnel syndrome, bilateral 07/31/2015   Cataract    Cervical spondylosis without myelopathy    Chronic chest pain    Chronic kidney disease    Chronic low back pain    Chronotropic incompetence    CKD (chronic kidney disease), stage III (HCC)    Coronary artery disease    a. inf-post MI 2011 s/p DES to RCA. b. DES to RCA 06/2010. c. DES to LAD 2014, residual dz treated medically.   Depression    DVT (deep venous thrombosis) (HCC)    left   Esophageal stricture    GERD (gastroesophageal reflux disease)  Gross hematuria    Heart murmur    History of esophageal dilatation    FOR STRIUCTURE   History of gout    History of hiatal hernia    History of kidney stones    Hyperlipidemia    Hypertension    Hypothyroidism    Internal hemorrhoids    Irritable bowel syndrome    Mild aortic stenosis    by echo 2018   Myocardial infarction (HCC)    Obesity    Orthostatic hypotension    Partial seizure disorder  (HCC) NEUROLOGIST-- DR Anne Hahn   NOCTURNAL   PONV (postoperative nausea and vomiting)    hard time getting iv site-had to do neck stick 2 yrs ago   Pulmonary fibrosis (HCC)    S/P pericardial cyst excision    02-05-2013  benign   Seizures (HCC)    none in yrs   SVT (supraventricular tachycardia) (HCC)    Trigger finger, acquired 09/30/2015   Right middle finger   Vitamin D deficiency    Weakness     Past Surgical History:  Procedure Laterality Date   BIOPSY OF MEDIASTINAL MASS N/A 02/05/2013   Procedure: RESECTION OF MEDIASTINAL MASS;  Surgeon: Kerin Perna, MD;  Location: [facility] OR;  Service: Thoracic;  Laterality: N/A;   CARDIAC CATHETERIZATION Right 01/24/2015   Procedure: right femoral ARTERIAL LINE INSERTION;  Surgeon: Kerin Perna, MD;  Location: [facility] OR;  Service: Thoracic;  Laterality: Right;   CARDIOVASCULAR STRESS TEST  11-22-2013  dr Eldridge Dace   normal lexiscan study/  no ischemia/  normal LVF and wall motion/ ef 81%   COLONOSCOPY  last one 2014   CORONARY ANGIOGRAM  03/10/2011   Procedure: CORONARY ANGIOGRAM;  Surgeon: Corky Crafts, MD;  Location: [facility] CATH LAB;  Service: Cardiovascular;;   CORONARY ANGIOPLASTY WITH STENT PLACEMENT  11-22-2009  dr Eldridge Dace   Acute inferoposterior MI/  ef 60%,  PCI with DES x1 to dRCA,  25%  mLAD   CORONARY ANGIOPLASTY WITH STENT PLACEMENT  06-26-2010  dr Verdis Prime   Cutting balloon angioplasty to dRCA with DES x1,  40% mLAD (non-obstructive CAD)   CYSTOSCOPY WITH RETROGRADE PYELOGRAM, URETEROSCOPY AND STENT PLACEMENT Right 02/13/2014   Procedure: CYSTOSCOPY WITH RETROGRADE PYELOGRAM, URETEROSCOPY AND STENT PLACEMENT;  Surgeon: Valetta Fuller, MD;  Location: [facility];  Service: Urology;  Laterality: Right;   ECTOPIC PREGNANCY SURGERY  YRS AGO   SALPINGECTOMY   ESOPHAGOGASTRODUODENOSCOPY N/A 02/15/2014   Procedure: ESOPHAGOGASTRODUODENOSCOPY (EGD);  Surgeon: Beverley Fiedler, MD;  Location: [facility];  Service: Endoscopy;   Laterality: N/A;   ESOPHAGOGASTRODUODENOSCOPY (EGD) WITH ESOPHAGEAL DILATION  05-20-2011   HOLMIUM LASER APPLICATION Right 02/13/2014   Procedure: HOLMIUM LASER APPLICATION;  Surgeon: Valetta Fuller, MD;  Location: [facility];  Service: Urology;  Laterality: Right;   LEFT HEART CATH AND CORONARY ANGIOGRAPHY N/A 09/12/2019   Procedure: LEFT HEART CATH AND CORONARY ANGIOGRAPHY;  Surgeon: Corky Crafts, MD;  Location: [facility] INVASIVE CV LAB;  Service: Cardiovascular;  Laterality: N/A;   LEFT HEART CATH AND CORONARY ANGIOGRAPHY N/A 09/16/2021   Procedure: LEFT HEART CATH AND CORONARY ANGIOGRAPHY;  Surgeon: Swaziland, Peter M, MD;  Location: [facility] INVASIVE CV LAB;  Service: Cardiovascular;  Laterality: N/A;   LEFT HEART CATHETERIZATION WITH CORONARY ANGIOGRAM N/A 03/14/2012   Procedure: LEFT HEART CATHETERIZATION WITH CORONARY ANGIOGRAM;  Surgeon: Quintella Reichert, MD;  Location: [facility] CATH LAB;  Service: Cardiovascular;  Laterality: N/A;  Normal LM,  50% pLAD,  70% mLAD,  D2 50-70%, very tortuous LAD,  70-82% ostial LCFX,  50% in-stent restenosis of  dRCA and mRCA  stent and 50-70% ostial PDA,  normal LVSF, ef 60%   LUNG BIOPSY Right 01/24/2015   Procedure: RIGHT LUNG BIOPSY;  Surgeon: Kerin Perna, MD;  Location: [facility] OR;  Service: Thoracic;  Laterality: Right;   MEDIASTERNOTOMY N/A 02/05/2013   Procedure: MEDIAN STERNOTOMY;  Surgeon: Kerin Perna, MD;  Location: [facility] OR;  Service: Thoracic;  Laterality: N/A;   MEDIASTERNOTOMY Left 02/05/2013   NEEDLE GUIDED EXCISION BREAST CALCIFICATIONS Right 08-09-2008   PERCUTANEOUS CORONARY STENT INTERVENTION (PCI-S) N/A 04/03/2012   Procedure: PERCUTANEOUS CORONARY STENT INTERVENTION (PCI-S);  Surgeon: Corky Crafts, MD;  Location: [facility] CATH LAB;  Service: Cardiovascular;  Laterality: N/A;   Successful PCI  mLAD with 2.75x12 Promus stent, postdilated to >0.75mm   THORACOTOMY Right 01/24/2015   Procedure: RIGHT MINI/LIMITED THORACOTOMY;  Surgeon: Kerin Perna, MD;  Location: [facility] OR;  Service: Thoracic;  Laterality: Right;   TRANSTHORACIC ECHOCARDIOGRAM  11-17-2011   grade I diastolic dysfunction/  ef 55-60%   VIDEO BRONCHOSCOPY N/A 01/24/2015   Procedure: RIGHT VIDEO BRONCHOSCOPY;  Surgeon: Kerin Perna, MD;  Location: [facility] OR;  Service: Thoracic;  Laterality: N/A;       Inpatient Medications: Scheduled Meds:  ALPRAZolam  0.25 mg Oral BID   ARIPiprazole  5 mg Oral Daily   aspirin EC  81 mg Oral Daily   Chlorhexidine Gluconate Cloth  6 each Topical Daily   DULoxetine  90 mg Oral Daily   [START ON 03/22/2023] furosemide  40 mg Intravenous Daily   [START ON 03/22/2023] levothyroxine  75 mcg Oral q morning   pantoprazole  40 mg Oral BID   rosuvastatin  40 mg Oral Daily   Continuous Infusions:  amiodarone 60 mg/hr (03/21/23 1527)   Followed by   amiodarone      ceFAZolin (ANCEF) IV Stopped (03/21/23 1103)   heparin 700 Units/hr (03/21/23 1532)   levETIRAcetam     PRN Meds: acetaminophen **OR** acetaminophen, HYDROcodone-acetaminophen, metoprolol tartrate, morphine injection, ondansetron **OR** ondansetron (ZOFRAN) IV  Allergies:   No Known Allergies  Social History:   Social History   Socioeconomic History   Marital status: Married    Spouse name: Peyton Najjar   Number of children: 0   Years of education: hs   Highest education level: Not on file  Occupational History   Occupation: Retired    Associate Professor: RETIRED  Tobacco Use   Smoking status: Former    Current packs/day: 0.00    Average packs/day: 0.3 packs/day for 3.0 years (0.9 ttl pk-yrs)    Types: Cigarettes    Start date: 11/08/1964    Quit date: 11/09/1967    Years since quitting: 55.4   Smokeless tobacco: Never  Vaping Use   Vaping status: Never Used  Substance and Sexual Activity   Alcohol use: No    Alcohol/week: 0.0 standard drinks of alcohol   Drug use: No   Sexual activity: Not on file  Other Topics Concern   Not on file  Social History Narrative   Lives  at home, married   Left-handed   Daily caffeine use: coffee.   Social Drivers of Corporate investment banker Strain: Not on file  Food Insecurity: No Food Insecurity (03/21/2023)   Hunger Vital Sign    Worried About Running Out of Food in the Last Year: Never true    Ran  Out of Food in the Last Year: Never true  Transportation Needs: No Transportation Needs (03/21/2023)   PRAPARE - Administrator, Civil Service (Medical): No    Lack of Transportation (Non-Medical): No  Physical Activity: Not on file  Stress: Not on file  Social Connections: Socially Isolated (03/21/2023)   Social Connection and Isolation Panel [NHANES]    Frequency of Communication with Friends and Family: More than three times a week    Frequency of Social Gatherings with Friends and Family: Once a week    Attends Religious Services: Never    Database administrator or Organizations: No    Attends Banker Meetings: Never    Marital Status: Widowed  Intimate Partner Violence: Not At Risk (03/21/2023)   Humiliation, Afraid, Rape, and Kick questionnaire    Fear of Current or Ex-Partner: No    Emotionally Abused: No    Physically Abused: No    Sexually Abused: No    Family History:    Family History  Problem Relation Age of Onset   Heart attack Mother    CVA Mother    Heart disease Father    Hypertension Father    Emphysema Father    Coronary artery disease Father    Diabetes Mellitus I Father    Breast cancer Sister    Breast cancer Niece        niece   Colon cancer Neg Hx    Stomach cancer Neg Hx    Esophageal cancer Neg Hx    Rectal cancer Neg Hx    Seizures Neg Hx      ROS:  Please see the history of present illness.   All other ROS reviewed and negative.     Physical Exam/Data:   Vitals:   03/21/23 1600 03/21/23 1652 03/21/23 1819 03/21/23 2019  BP: (!) 86/64 97/61  95/70  Pulse: (!) 129 (!) 110  (!) 189  Resp: (!) 25 (!) 21  16  Temp:  (!) 97.4 F (36.3 C) 97.7  F (36.5 C) 97.6 F (36.4 C)  TempSrc:  Oral Oral Oral  SpO2: 92% 91%  94%  Weight:      Height:        Intake/Output Summary (Last 24 hours) at 03/21/2023 2039 Last data filed at 03/21/2023 1859 Gross per 24 hour  Intake 352.68 ml  Output 1725 ml  Net -1372.32 ml      03/20/2023    1:12 PM 02/07/2023    9:17 AM 02/03/2023   10:38 AM  Last 3 Weights  Weight (lbs) 110 lb 91 lb 91 lb 12.8 oz  Weight (kg) 49.896 kg 41.277 kg 41.64 kg     Body mass index is 21.48 kg/m.  General:  well developed, malnourished female  on 4 L/ min of O2. HEENT: normal Neck: 3+ cm JVD Vascular: No carotid bruits; Distal pulses 2+ bilaterally Cardiac:  irregularly irregular rhythm Lungs:  crackles heard through out the lungs.  Ext: 1+ edema Musculoskeletal:  No deformities Skin: warm and dry  Neuro:  CNs 2-12 intact, no focal abnormalities noted Psych:  Normal affect   EKG:  The EKG was personally reviewed and demonstrates:  Atrial fibrillation with RVR rate of 148 Telemetry:  Telemetry was personally reviewed and demonstrates:  Initially in normal sinus rhythm and went into atrial fibrillation with RVR at about 10 am.   Relevant CV Studies:  TTE on 03/21/23 IMPRESSIONS     1. Left ventricular ejection  fraction, by estimation, is 60 to 65%. The  left ventricle has normal function. The left ventricle has no regional  wall motion abnormalities. There is moderate concentric left ventricular  hypertrophy. Left ventricular  diastolic parameters are indeterminate.   2. Right ventricular systolic function is moderately reduced. The right  ventricular size is moderately enlarged.   3. Left atrial size was moderately dilated.   4. Right atrial size was severely dilated.   5. The mitral valve is degenerative. Mild mitral valve regurgitation.  Moderate mitral annular calcification.   6. Tricuspid valve regurgitation is mild to moderate.   7. Suspect moderate low-flow/low-gradient AS. Marland Kitchen The aortic  valve is  tricuspid. There is moderate calcification of the aortic valve. Aortic  valve regurgitation is not visualized. Moderate aortic valve stenosis.  Aortic valve area, by VTI measures 1.10  cm. Aortic valve mean gradient measures 8.0 mmHg. Aortic valve Vmax  measures 1.88 m/s.   8. The inferior vena cava is dilated in size with <50% respiratory  variability, suggesting right atrial pressure of 15 mmHg.   Conclusion(s)/Recommendation(s): PAtient in rapid AF at time of study.   FINDINGS   Left Ventricle: Left ventricular ejection fraction, by estimation, is 60  to 65%. The left ventricle has normal function. The left ventricle has no  regional wall motion abnormalities. The left ventricular internal cavity  size was normal in size. There is   moderate concentric left ventricular hypertrophy. Left ventricular  diastolic parameters are indeterminate.   Right Ventricle: The right ventricular size is moderately enlarged. No  increase in right ventricular wall thickness. Right ventricular systolic  function is moderately reduced.   Left Atrium: Left atrial size was moderately dilated.   Right Atrium: Right atrial size was severely dilated.   Pericardium: There is no evidence of pericardial effusion.   Mitral Valve: The mitral valve is degenerative in appearance. There is  mild calcification of the mitral valve leaflet(s). Moderate mitral annular  calcification. Mild mitral valve regurgitation. MV peak gradient, 6.5  mmHg. The mean mitral valve gradient  is 4.0 mmHg.   Tricuspid Valve: The tricuspid valve is normal in structure. Tricuspid  valve regurgitation is mild to moderate.   Aortic Valve: Suspect moderate low-flow/low-gradient AS. The aortic valve  is tricuspid. There is moderate calcification of the aortic valve. Aortic  valve regurgitation is not visualized. Moderate aortic stenosis is  present. Aortic valve mean gradient  measures 8.0 mmHg. Aortic valve peak gradient  measures 14.2 mmHg. Aortic  valve area, by VTI measures 1.10 cm.   Pulmonic Valve: The pulmonic valve was normal in structure. Pulmonic valve  regurgitation is trivial.   Aorta: The aortic root is normal in size and structure.   Venous: The inferior vena cava is dilated in size with less than 50%  respiratory variability, suggesting right atrial pressure of 15 mmHg.   IAS/Shunts: No atrial level shunt detected by color flow Doppler.    Laboratory Data:  High Sensitivity Troponin:   Recent Labs  Lab 03/20/23 1336 03/20/23 1545  TROPONINIHS 72* 88*     Chemistry Recent Labs  Lab 03/20/23 1336 03/21/23 0407 03/21/23 0830  NA 140 139  --   K 4.3 3.3*  --   CL 111 106  --   CO2 15* 20*  --   GLUCOSE 108* 94  --   BUN 42* 42*  --   CREATININE 1.15* 1.42*  --   CALCIUM 7.4* 6.8*  --   MG  --   --  1.0*  GFRNONAA 49* 38*  --   ANIONGAP 14 13  --     Recent Labs  Lab 03/21/23 0407  PROT 5.7*  ALBUMIN 2.7*  AST 40  ALT 26  ALKPHOS 72  BILITOT 1.5*   Lipids No results for input(s): "CHOL", "TRIG", "HDL", "LABVLDL", "LDLCALC", "CHOLHDL" in the last 168 hours.  Hematology Recent Labs  Lab 03/20/23 1336 03/21/23 0407  WBC 15.6* 16.4*  RBC 3.65* 3.22*  HGB 10.6* 9.5*  HCT 34.1* 29.9*  MCV 93.4 92.9  MCH 29.0 29.5  MCHC 31.1 31.8  RDW 14.1 14.0  PLT 252 256   Thyroid  Recent Labs  Lab 03/21/23 0407  TSH 6.328*  FREET4 1.48*    BNP Recent Labs  Lab 03/20/23 1336  BNP 1,056.4*    DDimer No results for input(s): "DDIMER" in the last 168 hours.   Radiology/Studies:  ECHOCARDIOGRAM COMPLETE Result Date: 03/21/2023    ECHOCARDIOGRAM REPORT   Patient Name:   [name] Date of Exam: 03/21/2023 Medical Rec #:  [id number]      Height:       60.0 in Accession #:    [id number]     Weight:       110.0 lb Date of Birth:  04-11-1945      BSA:          1.448 m Patient Age:    77 years       BP:           97/62 mmHg Patient Gender: F              HR:            154 bpm. Exam Location:  Inpatient Procedure: 2D Echo, Cardiac Doppler and Color Doppler (Both Spectral and Color            Flow Doppler were utilized during procedure). Indications:    Congestive heart failure  History:        Patient has prior history of Echocardiogram examinations. CHF,                 CAD, Arrythmias:Atrial Fibrillation; Risk Factors:Hypertension.  Sonographer:    Lamont Snowball Referring Phys: Zachery Conch PATEL  Sonographer Comments: Image acquisition challenging due to uncooperative patient. IMPRESSIONS  1. Left ventricular ejection fraction, by estimation, is 60 to 65%. The left ventricle has normal function. The left ventricle has no regional wall motion abnormalities. There is moderate concentric left ventricular hypertrophy. Left ventricular diastolic parameters are indeterminate.  2. Right ventricular systolic function is moderately reduced. The right ventricular size is moderately enlarged.  3. Left atrial size was moderately dilated.  4. Right atrial size was severely dilated.  5. The mitral valve is degenerative. Mild mitral valve regurgitation. Moderate mitral annular calcification.  6. Tricuspid valve regurgitation is mild to moderate.  7. Suspect moderate low-flow/low-gradient AS. Marland Kitchen The aortic valve is tricuspid. There is moderate calcification of the aortic valve. Aortic valve regurgitation is not visualized. Moderate aortic valve stenosis. Aortic valve area, by VTI measures 1.10 cm. Aortic valve mean gradient measures 8.0 mmHg. Aortic valve Vmax measures 1.88 m/s.  8. The inferior vena cava is dilated in size with <50% respiratory variability, suggesting right atrial pressure of 15 mmHg. Conclusion(s)/Recommendation(s): PAtient in rapid AF at time of study. FINDINGS  Left Ventricle: Left ventricular ejection fraction, by estimation, is 60 to 65%. The left ventricle has normal function. The left ventricle has no regional wall motion abnormalities. The  left ventricular internal cavity  size was normal in size. There is  moderate concentric left ventricular hypertrophy. Left ventricular diastolic parameters are indeterminate. Right Ventricle: The right ventricular size is moderately enlarged. No increase in right ventricular wall thickness. Right ventricular systolic function is moderately reduced. Left Atrium: Left atrial size was moderately dilated. Right Atrium: Right atrial size was severely dilated. Pericardium: There is no evidence of pericardial effusion. Mitral Valve: The mitral valve is degenerative in appearance. There is mild calcification of the mitral valve leaflet(s). Moderate mitral annular calcification. Mild mitral valve regurgitation. MV peak gradient, 6.5 mmHg. The mean mitral valve gradient is 4.0 mmHg. Tricuspid Valve: The tricuspid valve is normal in structure. Tricuspid valve regurgitation is mild to moderate. Aortic Valve: Suspect moderate low-flow/low-gradient AS. The aortic valve is tricuspid. There is moderate calcification of the aortic valve. Aortic valve regurgitation is not visualized. Moderate aortic stenosis is present. Aortic valve mean gradient measures 8.0 mmHg. Aortic valve peak gradient measures 14.2 mmHg. Aortic valve area, by VTI measures 1.10 cm. Pulmonic Valve: The pulmonic valve was normal in structure. Pulmonic valve regurgitation is trivial. Aorta: The aortic root is normal in size and structure. Venous: The inferior vena cava is dilated in size with less than 50% respiratory variability, suggesting right atrial pressure of 15 mmHg. IAS/Shunts: No atrial level shunt detected by color flow Doppler.  LEFT VENTRICLE PLAX 2D LVIDd:         2.80 cm LVIDs:         2.20 cm LV PW:         1.30 cm LV IVS:        1.20 cm LVOT diam:     1.90 cm LV SV:         31 LV SV Index:   21 LVOT Area:     2.84 cm  RIGHT VENTRICLE          IVC RV Basal diam:  4.50 cm  IVC diam: 1.90 cm LEFT ATRIUM             Index        RIGHT ATRIUM           Index LA Vol (A2C):   45.8 ml  31.63 ml/m  RA Area:     16.80 cm LA Vol (A4C):   30.4 ml 21.00 ml/m  RA Volume:   47.60 ml  32.88 ml/m LA Biplane Vol: 38.0 ml 26.25 ml/m  AORTIC VALVE AV Area (Vmax):    1.25 cm AV Area (Vmean):   1.17 cm AV Area (VTI):     1.10 cm AV Vmax:           188.50 cm/s AV Vmean:          136.500 cm/s AV VTI:            0.282 m AV Peak Grad:      14.2 mmHg AV Mean Grad:      8.0 mmHg LVOT Vmax:         83.33 cm/s LVOT Vmean:        56.233 cm/s LVOT VTI:          0.109 m LVOT/AV VTI ratio: 0.39  AORTA Ao Root diam: 2.60 cm Ao Asc diam:  2.90 cm MITRAL VALVE MV Area (PHT): 6.13 cm     SHUNTS MV Area VTI:   2.46 cm     Systemic VTI:  0.11 m MV Peak grad:  6.5 mmHg  Systemic Diam: 1.90 cm MV Mean grad:  4.0 mmHg MV Vmax:       1.27 m/s MV Vmean:      95.4 cm/s MV Decel Time: 124 msec MV E velocity: 115.67 cm/s Arvilla Meres MD Electronically signed by Arvilla Meres MD Signature Date/Time: 03/21/2023/1:52:00 PM    Final    DG Chest Portable 1 View Result Date: 03/20/2023 CLINICAL DATA:  Shortness of breath. EXAM: PORTABLE CHEST 1 VIEW COMPARISON:  November 18, 2022. FINDINGS: Stable cardiomediastinal silhouette. Sternotomy wires are noted. Diffuse interstitial densities are noted throughout both lungs concerning for pulmonary edema or possibly multifocal pneumonia. Small pleural effusions are noted. Bony thorax is unremarkable. IMPRESSION: Diffuse bilateral lung opacities concerning for edema or pneumonia. Electronically Signed   By: Lupita Raider M.D.   On: 03/20/2023 13:51     Assessment and Plan:   [name] is a 78 y.o. female with a hx of heart failure with preserved ejection fraction, coronary artery disease s/p DES to RCA in 2011 for MI, DES to RCA on 06/2010 for chest pain, DES to LAD in 2014 for chest pain, hypertension, hyperlipidemia, aortic stenosis, History of non cardiac chest pain, GERD, esophageal stricture with esophageal dilation, hypothyroidism, renal insufficiency, chronic  respiratory failure, interstitial lung disease, pericardial cyst s/p resection who is being seen 03/21/2023 for the evaluation of shortness of breath at the request of Lynden Oxford MD.  Acute on chronic HFpEF - Most recent echo on 09/2021 showed an LVEF of 60-65%. There was no regional wall motion abnormalities, and mild concentric LVH with an elevated left atrial pressure. Mild aortic valve stenosis. - Echo today showed LVEF of 60-65%, no regional wall motion abnormalities, moderate left atrial dilation, severe right atrial dilation, mild mitral regurgitation with moderate annular calcification. Mild to moderate tricuspid regurgitation. There was moderate low flow low gradient aortic stenosis.  Chest x-ray showed bilateral lung opacities that were concerning for pulmonary edema of multifocal pneumonia. Procalcitonin today was normal at 0.72. is currently being treated with IV ancef. Was previously placed on IV lasix 40 mg daily I/O is net 2.45 L out. Todays magnesium was 1.0 and potassium 3.3. Electrolyte replacement was previously ordered. - Continue IV lasix 40 mg daily.  Paroxysmal atrial fibrillation with RVR Chads2-vasc: 7 (CHF 1, HTN 1, DM 1, Age 35, Vascular disease 1, female 1)  - An EKG today at 10 am showed atrial fibrillation with RVR and a rate of 148.  - TSH today was elevated at 6.328 and direct T4 was elevated at 1.48. Previously had synthroid decreased to 75 mcg by primary. Home synthroid was 88 mcg - Diltiazem was stopped due to lowering blood pressure. Was given a dose of IV metoprolol. IV amiodarone was started. Currently on IV heparin. - Continues to have poorly controled rates. Most recent rate was 113. She may need a TEE cardioversion. Of note she had a prior esophageal stricture and dilation.  - Continue IV amiodarone and IV heparin. Will consider DOAC after new onset normocytic anemia resolves  Coronary artery disease s/p DES to RCA in 2011 for MI, DES to RCA on 06/2010 for  chest pain, DES to LAD in 2014 for chest pain Elevated troponins - Troponin's were elevated 72>88. - no chest pain or diaphoresis. Echo showed no regional wall motion abnormalities. - Suspect elevated troponin's are secondary to demand ischemia from HFpEF rather than ACS. No further ischemic evaluation currently planned.   Moderate low flow aortic stenosis  Hypertension -  hold home diltiazem 180 mg daily and Bystolic 5 mg twice daily due to hypotension.  Hyperlipidemia - continue home statin - LDL was 60 on 9/23.  Hypothyroidism - Previously had synthroid decreased to 75 mcg by primary. Home synthroid was 88 mcg.  GERD - on protonix - management per primary  Hypomagnesemia - Primary ordered magnesium replacement with 4 gm magnesium sulfate.  Hypokalemia - was given a dose of potassium  Normocytic anemia - hemoglobin is 9.5. was previously 12.0 three months ago - no hematochezia, melena, or hematuria. - management per primary.  Seizures - Currently on IV Keppra. - management per primary   Risk Assessment/Risk Scores:   For questions or updates, please contact [facility] Please consult [email or web address] for contact info under  Signed, Thomasene Ripple, DO  03/21/2023 8:39 PM   Patient seen and examined, note reviewed with the signed Advanced Practice Provider. I personally reviewed laboratory data, imaging studies and relevant notes. I independently examined the patient and formulated the important aspects of the plan. I have personally discussed the plan with the patient and/or family. Comments or changes to the note/plan are indicated below.  PAF Acute on chronic heart failure with preserved ejection fraction  CAD , elevated troponin  Moderate low flow low gradient aortic stenosis  Hypertension   Afib rvr at the time of my evaluation - has been started on amio gtt , I agree with this continue for now.  Heparin gtt stated as CHADS2VASc score is 7 - need stroke  prevention. Plan to transition to Eliquis prior to discharge.  Will monitor my hope is spontaneous conversion if not will consider inpatient TEE/CV but will have to monitor hemoglobin since low prior to proceeding with DCCV.  Afib new  - in the setting of her current infection CAD - no anginal symptoms.  Hypertension - at target   Thomasene Ripple DO, MS [facility] Attending Cardiologist [facility]  [address] [place], [place] [address] [phone] Website: [email or web address]

## 2023-03-21 NOTE — ED Notes (Signed)
 Removed pt from bipap, placed on 4L [place]. Pt requesting water, mouth visibly dry. Difficulty obtaining pulse ox readings, fingers cold, best sat at 97% on 4L

## 2023-03-21 NOTE — Progress Notes (Signed)
 [facility] Progress Note Patient: [name] VOZ:[id number] DOB: [birth year] DOA: 03/20/2023  DOS: the patient was seen and examined on 03/21/2023  Brief Hospital Course: Patient with PMH of CAD with PCI to RCA, recent cath 9/23 with nonobstructive CAD and patent stents, mild aortic stenosis, echo EF 6065% in 2023, HTN, anxiety, chronic pain syndrome, GERD, hypothyroidism, HLD, ILD, CKD 3A presented to the hospital with complaints of leg swelling and shortness of breath. Went to see PCP in 2/27 there was some trace edema noted but no significant shortness of breath.   niece found the patient with very short of breath and therefore patient was brought to the hospital. Currently being treated for acute on chronic HFpEF.  Assessment and Plan: Acute on chronic diastolic CHF. Presents with shortness of breath.  BNP significantly elevated.  Significant swelling in the leg.  Tripoding requiring BiPAP therapy and NRB in the ER. Chest x-ray shows significant vascular congestion.  There is comment about pneumonia although pneumonia is less likely. Echocardiogram 60 to 65% in September 2023. Last cath 9/23 40% stenosis and RPDA and second diagonal, otherwise nonobstructive CAD with patent stent in RCA. Continue with IV Lasix. Continue BiPAP as needed. Will follow cardiology recommendation. 1 close appears to be improved significantly.  Patient on 4 LPM right now. Holding other antihypertensive medications to allow room for aggressive diuresis.  Acute urinary retention. Was unable to urinate on her own with bladder scan showing 400+ cc. With respiratory distress as well as need for aggressive diuresis would place a Foley catheter. Attempt voiding trial in 48 hours.  Possible cellulitis. Possibility of a cellulitis cannot be ruled out given the presentation of leg swelling with redness and edema and pain. Will treat with cefazolin while on BiPAP. Switch to oral Keflex when successfully can  take p.o. Short course recommended.  Elevated troponin. History of CAD. Mild aortic stenosis HTN Last 2023.  Nonobstructive CAD with patent stent. Troponins minimally elevated but EKG does not show any evidence of acute ischemia. Patient does have a history of CAD, continue aspirin.   Check echocardiogram. Cardiology consult. There is no indication for therapeutic anticoagulation.  Paroxysmal atrial fibrillation with RVR. History of SVT. Patient has history of SVT and is on Cardizem. Cardizem was on hold on admission to allow room for diuresis. Found to have A-fib with RVR early in the morning of 3/10. IV Lopressor given.  Resume Bystolic.  Pending echocardiogram resume Cardizem. RVR could be the cause of her heart failure.  GERD. Continue PPI.  Hypothyroidism. Continuing Synthroid.  Both TSH and free T4 elevated. In the presence of RVR I will reduce the dose of the Synthroid to 75 mcg from 88.  History of seizures. Continuing Keppra. Will place orders for IV, can switch to p.o. if the patient is able to come off the BiPAP therapy.  Depression. On Cymbalta, currently on hold as unable to swallow due to BiPAP.  HLD. Continuing statin.  Goals of care conversation. Discussed with the patient as well as in presence of niece. Patient would like to continue DNR.   Prolonged QTc. Monitor on telemetry. Minimize QT prolonging medications.   Subjective: Feeling better.  Able to stay off of BiPAP.  No nausea no vomiting.  On 4 LPM.  Swelling in the leg improved.  Physical Exam: General: in moderate distress, No Rash Cardiovascular: S1 and S2 Present, No Murmur Respiratory: Increased respiratory effort, Bilateral Air entry present.  Bilateral crackles, No wheezes Abdomen: Bowel Sound present, No  tenderness Extremities: Improving bilateral edema Neuro: Alert and oriented x3, no new focal deficit  Data Reviewed: I have Reviewed nursing notes, Vitals, and Lab results. Since  last encounter, pertinent lab results CBC and BMP   . I have ordered test including CBC and BMP. I have independently visualized and interpreted EKG which showed EKG: atrial fibrillation, rate 140s.  Disposition: Status is: Inpatient Remains inpatient appropriate because: Monitor for improvement in heart failure symptoms  enoxaparin (LOVENOX) injection 40 mg Start: 03/20/23 2200   Family Communication: No one at bedside Level of care: Progressive   Vitals:   03/21/23 1000 03/21/23 1024 03/21/23 1025 03/21/23 1039  BP: 109/75   109/75  Pulse:    (!) 34  Resp: 19 (!) 30 (!) 32 (!) 27  Temp:      TempSrc:      SpO2:    100%  Weight:      Height:         Author: Lynden Oxford, MD 03/21/2023 10:51 AM  Please look on [email or web address] to find out who is on call.

## 2023-03-22 ENCOUNTER — Inpatient Hospital Stay (HOSPITAL_COMMUNITY)

## 2023-03-22 ENCOUNTER — Telehealth (HOSPITAL_COMMUNITY): Payer: Self-pay | Admitting: Pharmacy Technician

## 2023-03-22 ENCOUNTER — Other Ambulatory Visit (HOSPITAL_COMMUNITY): Payer: Self-pay

## 2023-03-22 ENCOUNTER — Other Ambulatory Visit: Payer: Self-pay

## 2023-03-22 ENCOUNTER — Encounter (HOSPITAL_COMMUNITY): Payer: Self-pay | Admitting: Internal Medicine

## 2023-03-22 DIAGNOSIS — Z452 Encounter for adjustment and management of vascular access device: Secondary | ICD-10-CM | POA: Diagnosis not present

## 2023-03-22 DIAGNOSIS — I517 Cardiomegaly: Secondary | ICD-10-CM | POA: Diagnosis not present

## 2023-03-22 DIAGNOSIS — I5033 Acute on chronic diastolic (congestive) heart failure: Secondary | ICD-10-CM | POA: Diagnosis not present

## 2023-03-22 DIAGNOSIS — J984 Other disorders of lung: Secondary | ICD-10-CM | POA: Diagnosis not present

## 2023-03-22 LAB — BLOOD GAS, ARTERIAL
Acid-base deficit: 5.6 mmol/L — ABNORMAL HIGH (ref 0.0–2.0)
Bicarbonate: 19.3 mmol/L — ABNORMAL LOW (ref 20.0–28.0)
O2 Saturation: 94 %
Patient temperature: 36.9
pCO2 arterial: 35 mmHg (ref 32–48)
pH, Arterial: 7.35 (ref 7.35–7.45)
pO2, Arterial: 71 mmHg — ABNORMAL LOW (ref 83–108)

## 2023-03-22 LAB — CBC
HCT: 29.6 % — ABNORMAL LOW (ref 36.0–46.0)
Hemoglobin: 9.5 g/dL — ABNORMAL LOW (ref 12.0–15.0)
MCH: 29.7 pg (ref 26.0–34.0)
MCHC: 32.1 g/dL (ref 30.0–36.0)
MCV: 92.5 fL (ref 80.0–100.0)
Platelets: 300 10*3/uL (ref 150–400)
RBC: 3.2 MIL/uL — ABNORMAL LOW (ref 3.87–5.11)
RDW: 14 % (ref 11.5–15.5)
WBC: 14.5 10*3/uL — ABNORMAL HIGH (ref 4.0–10.5)
nRBC: 0 % (ref 0.0–0.2)

## 2023-03-22 LAB — RENAL FUNCTION PANEL
Albumin: 2.7 g/dL — ABNORMAL LOW (ref 3.5–5.0)
Anion gap: 11 (ref 5–15)
BUN: 54 mg/dL — ABNORMAL HIGH (ref 8–23)
CO2: 19 mmol/L — ABNORMAL LOW (ref 22–32)
Calcium: 6 mg/dL — CL (ref 8.9–10.3)
Chloride: 106 mmol/L (ref 98–111)
Creatinine, Ser: 1.77 mg/dL — ABNORMAL HIGH (ref 0.44–1.00)
GFR, Estimated: 29 mL/min — ABNORMAL LOW (ref >60)
Glucose, Bld: 105 mg/dL — ABNORMAL HIGH (ref 70–99)
Phosphorus: 8.6 mg/dL — ABNORMAL HIGH (ref 2.5–4.6)
Potassium: 4.1 mmol/L (ref 3.5–5.1)
Sodium: 136 mmol/L (ref 135–145)

## 2023-03-22 LAB — HEPARIN LEVEL (UNFRACTIONATED)
Heparin Unfractionated: 0.17 [IU]/mL — ABNORMAL LOW (ref 0.30–0.70)
Heparin Unfractionated: 0.21 [IU]/mL — ABNORMAL LOW (ref 0.30–0.70)
Heparin Unfractionated: 0.25 [IU]/mL — ABNORMAL LOW (ref 0.30–0.70)

## 2023-03-22 LAB — MAGNESIUM: Magnesium: 2.5 mg/dL — ABNORMAL HIGH (ref 1.7–2.4)

## 2023-03-22 MED ORDER — LEVOTHYROXINE SODIUM 88 MCG PO TABS
88.0000 ug | ORAL_TABLET | Freq: Every morning | ORAL | Status: DC
  Administered 2023-03-23: 88 ug via ORAL
  Filled 2023-03-22: qty 1

## 2023-03-22 MED ORDER — SENNOSIDES-DOCUSATE SODIUM 8.6-50 MG PO TABS
1.0000 | ORAL_TABLET | Freq: Two times a day (BID) | ORAL | Status: DC
  Administered 2023-03-22 – 2023-04-01 (×18): 1 via ORAL
  Filled 2023-03-22 (×20): qty 1

## 2023-03-22 MED ORDER — CALCIUM GLUCONATE-NACL 1-0.675 GM/50ML-% IV SOLN
1.0000 g | Freq: Once | INTRAVENOUS | Status: AC
  Administered 2023-03-22: 1000 mg via INTRAVENOUS
  Filled 2023-03-22: qty 50

## 2023-03-22 MED ORDER — ALBUMIN HUMAN 25 % IV SOLN: 12.5000 g | Freq: Once | INTRAVENOUS | Status: DC

## 2023-03-22 MED ORDER — SODIUM CHLORIDE 0.9% FLUSH
10.0000 mL | Freq: Two times a day (BID) | INTRAVENOUS | Status: DC
  Administered 2023-03-22 – 2023-03-24 (×5): 10 mL
  Administered 2023-03-25 (×2): 20 mL
  Administered 2023-03-26 – 2023-03-30 (×9): 10 mL
  Administered 2023-03-31: 30 mL

## 2023-03-22 MED ORDER — FUROSEMIDE 10 MG/ML IJ SOLN: 40.0000 mg | Freq: Once | INTRAMUSCULAR | Status: DC

## 2023-03-22 MED ORDER — HYDROCODONE-ACETAMINOPHEN 5-325 MG PO TABS
1.0000 | ORAL_TABLET | Freq: Four times a day (QID) | ORAL | Status: DC | PRN
  Administered 2023-03-25 – 2023-03-31 (×7): 1 via ORAL
  Filled 2023-03-22 (×7): qty 1

## 2023-03-22 MED ORDER — ALPRAZOLAM 0.25 MG PO TABS
0.2500 mg | ORAL_TABLET | Freq: Two times a day (BID) | ORAL | Status: DC | PRN
  Administered 2023-03-23 – 2023-03-27 (×9): 0.25 mg via ORAL
  Filled 2023-03-22 (×9): qty 1

## 2023-03-22 MED ORDER — SODIUM CHLORIDE 0.9% FLUSH
10.0000 mL | INTRAVENOUS | Status: DC | PRN
  Administered 2023-03-26: 40 mL

## 2023-03-22 NOTE — Plan of Care (Signed)
  Problem: Clinical Measurements: Goal: Ability to maintain clinical measurements within normal limits will improve Outcome: Progressing Goal: Will remain free from infection Outcome: Progressing Goal: Respiratory complications will improve Outcome: Progressing Goal: Cardiovascular complication will be avoided Outcome: Progressing   Problem: Coping: Goal: Level of anxiety will decrease Outcome: Progressing   Problem: Elimination: Goal: Will not experience complications related to urinary retention Outcome: Progressing   Problem: Pain Managment: Goal: General experience of comfort will improve and/or be controlled Outcome: Progressing   Problem: Safety: Goal: Ability to remain free from injury will improve Outcome: Progressing

## 2023-03-22 NOTE — Progress Notes (Signed)
 PHARMACY - ANTICOAGULATION CONSULT NOTE  Pharmacy Consult for heparin  Indication: atrial fibrillation  No Known Allergies  Patient Measurements: Height: 5' (152.4 cm) Weight: 49.9 kg (110 lb) IBW/kg (Calculated) : 45.5 Heparin Dosing Weight: 49.9kg   Vital Signs: Temp: 97.6 F (36.4 C) (03/11 0024) Temp Source: Axillary (03/11 0024) BP: 109/84 (03/11 0024) Pulse Rate: 109 (03/11 0024)  Labs: Recent Labs    03/20/23 1336 03/20/23 1545 03/21/23 0407 03/22/23 0013  HGB 10.6*  --  9.5*  --   HCT 34.1*  --  29.9*  --   PLT 252  --  256  --   HEPARINUNFRC  --   --   --  0.17*  CREATININE 1.15*  --  1.42*  --   TROPONINIHS 72* 88*  --   --     Estimated Creatinine Clearance: 24.2 mL/min (A) (by C-G formula based on SCr of 1.42 mg/dL (H)).   Medical History: Past Medical History:  Diagnosis Date   Anxiety    Arthritis    Blood transfusion without reported diagnosis    childhood   Carpal tunnel syndrome, bilateral 07/31/2015   Cataract    Cervical spondylosis without myelopathy    Chronic chest pain    Chronic kidney disease    Chronic low back pain    Chronotropic incompetence    CKD (chronic kidney disease), stage III (HCC)    Coronary artery disease    a. inf-post MI 2011 s/p DES to RCA. b. DES to RCA 06/2010. c. DES to LAD 2014, residual dz treated medically.   Depression    DVT (deep venous thrombosis) (HCC)    left   Esophageal stricture    GERD (gastroesophageal reflux disease)    Gross hematuria    Heart murmur    History of esophageal dilatation    FOR STRIUCTURE   History of gout    History of hiatal hernia    History of kidney stones    Hyperlipidemia    Hypertension    Hypothyroidism    Internal hemorrhoids    Irritable bowel syndrome    Mild aortic stenosis    by echo 2018   Myocardial infarction (HCC)    Obesity    Orthostatic hypotension    Partial seizure disorder (HCC) NEUROLOGIST-- DR Anne Hahn   NOCTURNAL   PONV (postoperative  nausea and vomiting)    hard time getting iv site-had to do neck stick 2 yrs ago   Pulmonary fibrosis (HCC)    S/P pericardial cyst excision    02-05-2013  benign   Seizures (HCC)    none in yrs   SVT (supraventricular tachycardia) (HCC)    Trigger finger, acquired 09/30/2015   Right middle finger   Vitamin D deficiency    Weakness     Assessment: Patient admitted for CC of SOB with leg swelling. Patient with hx of SVT, now in Afib with RVR starting 3/10 AM. Not on anticoagulation PTA. HgB 9.6 and PLTs 256.   3/11 AM update:  Heparin level sub-therapeutic  Goal of Therapy:  Heparin level 0.3-0.7 units/ml Monitor platelets by anticoagulation protocol: Yes   Plan:  Inc heparin to 800 units/hr Heparin level in 8 hours  Abran Duke, PharmD, BCPS Clinical Pharmacist Phone: [phone]

## 2023-03-22 NOTE — Progress Notes (Signed)
 Per IV team- okay to use PICC

## 2023-03-22 NOTE — Progress Notes (Signed)
 PHARMACY - ANTICOAGULATION CONSULT NOTE  Pharmacy Consult for heparin  Indication: atrial fibrillation  No Known Allergies  Patient Measurements: Height: 5' (152.4 cm) Weight: 53.1 kg (117 lb 1 oz) IBW/kg (Calculated) : 45.5 Heparin Dosing Weight: 49.9kg   Vital Signs: Temp: 97.5 F (36.4 C) (03/11 0803) Temp Source: Oral (03/11 0803) BP: 127/93 (03/11 0803) Pulse Rate: 99 (03/11 0803)  Labs: Recent Labs    03/20/23 1336 03/20/23 1545 03/21/23 0407 03/22/23 0013 03/22/23 0447 03/22/23 0858  HGB 10.6*  --  9.5*  --  9.5*  --   HCT 34.1*  --  29.9*  --  29.6*  --   PLT 252  --  256  --  300  --   HEPARINUNFRC  --   --   --  0.17*  --  0.25*  CREATININE 1.15*  --  1.42*  --  1.77*  --   TROPONINIHS 72* 88*  --   --   --   --     Estimated Creatinine Clearance: 19.4 mL/min (A) (by C-G formula based on SCr of 1.77 mg/dL (H)).   Medical History: Past Medical History:  Diagnosis Date   Anxiety    Arthritis    Blood transfusion without reported diagnosis    childhood   Carpal tunnel syndrome, bilateral 07/31/2015   Cataract    Cervical spondylosis without myelopathy    Chronic chest pain    Chronic kidney disease    Chronic low back pain    Chronotropic incompetence    CKD (chronic kidney disease), stage III (HCC)    Coronary artery disease    a. inf-post MI 2011 s/p DES to RCA. b. DES to RCA 06/2010. c. DES to LAD 2014, residual dz treated medically.   Depression    DVT (deep venous thrombosis) (HCC)    left   Esophageal stricture    GERD (gastroesophageal reflux disease)    Gross hematuria    Heart murmur    History of esophageal dilatation    FOR STRIUCTURE   History of gout    History of hiatal hernia    History of kidney stones    Hyperlipidemia    Hypertension    Hypothyroidism    Internal hemorrhoids    Irritable bowel syndrome    Mild aortic stenosis    by echo 2018   Myocardial infarction (HCC)    Obesity    Orthostatic hypotension     Partial seizure disorder (HCC) NEUROLOGIST-- DR Anne Hahn   NOCTURNAL   PONV (postoperative nausea and vomiting)    hard time getting iv site-had to do neck stick 2 yrs ago   Pulmonary fibrosis (HCC)    S/P pericardial cyst excision    02-05-2013  benign   Seizures (HCC)    none in yrs   SVT (supraventricular tachycardia) (HCC)    Trigger finger, acquired 09/30/2015   Right middle finger   Vitamin D deficiency    Weakness     Assessment: Patient admitted for CC of SOB with leg swelling. Patient with hx of SVT, now in Afib with RVR. Plans are for possible DCCV  -heparin level= 0.25 on heparin 800 units/hr -hg= 9.5  Goal of Therapy:  Heparin level 0.3-0.7 units/ml Monitor platelets by anticoagulation protocol: Yes   Plan:  -Increase heparin to 900 units/hr -heparin level in 8 hrs  Harland German, PharmD Clinical Pharmacist **Pharmacist phone directory can now be found on [email or web address] ([email or web address]).  Listed under [facility].

## 2023-03-22 NOTE — Progress Notes (Signed)
 At around 2244H, patient is drowsy and somnolent, tried to wake patient up for night time meds. Patient was able to open her eyes just for short period of time and then goes back to sleep. Patient able to follow commands (squeeze both hands, stick out her tongue but unable to move side to side due to drowsiness). She is unable to answer orientation questions but able to state "I'm okay". This RN held patient's pill (xanax, protonix). BP was also soft at 83/58. Night on call provider notified. Rapid Response RN, Phyllis Ginger, was also notified, came in to assess the patient at bedside.  At 2324H Dr. Margo Aye came at bedside and assessed patient. Ordered to Hold Amiodarone gtt and do a stat ABG. This RN called Respiratory therapist for BiPAP placement. Patient was given midodrine per Dr. Margo Aye order.  03/22/23 - At 0004H Patient was placed on BiPAP. Patient tolerating BiPAP.  03/22/23 - At 0028H, Notified on call Cardiology MD, Dr Marcy Panning. Restarted Amiodarone gtt per order.  Felicity Coyer, RN

## 2023-03-22 NOTE — Plan of Care (Signed)
  Problem: Clinical Measurements: Goal: Will remain free from infection Outcome: Progressing Goal: Respiratory complications will improve Outcome: Progressing Goal: Cardiovascular complication will be avoided Outcome: Progressing   Problem: Safety: Goal: Ability to remain free from injury will improve Outcome: Progressing   Problem: Cardiac: Goal: Ability to achieve and maintain adequate cardiopulmonary perfusion will improve Outcome: Progressing

## 2023-03-22 NOTE — TOC Initial Note (Signed)
 Transition of Care [facility]) - Initial/Assessment Note    Patient Details  Name: [name] MRN: [id number] Date of Birth: 20-Jul-1945  Transition of Care [facility]) CM/SW Contact:    Alesia Richards, RN Phone Number: [phone] 03/22/2023, 1:57 PM  Clinical Narrative:                 CM to patient's room regarding TOC screening assessment. CM introduced role and discharge care planning process. Patient agreeable to screening interview. Patient states lives alone with 1 dog--vaccinated. Patient states uses walker and cane. Patient states has home health experience. Patient states no SNF experience. Patient provided CM permission to discuss care with niece, [name]. Patient states will return home via patient's niece [name]. CM call to patient's niece, [name], phone; [phone]. Per patient's niece, patient has previous SNF experience at [facility], [facility] and previous home health experience. Per patient's niece, home health PT has not been to home visit in about 3 weeks; contact number provided of therapist, Beverlyn Roux, phone: [phone], [facility]. Per patient's niece, is requesting assistance with applying for Medicaid for long term care for patient as patient's health has declined. CM consult to Financial Counselor for initiation of Medicaid process.CM consult to Dr. Allena Katz, regarding PT/OT order to eval/assess.      Expected Discharge Plan: Home/Self Care Barriers to Discharge: Continued Medical Work up   Patient Goals and CMS Choice Patient states their goals for this hospitalization and ongoing recovery are:: are to return home via patient's niece, [name]    Expected Discharge Plan and Services   Discharge Planning Services: CM Consult   Living arrangements for the past 2 months: Single Family Home                  Prior Living Arrangements/Services Living arrangements for the past 2 months: Single Family Home Lives with:: Self, Pets (1  dog--vaccinated) Patient language and need for interpreter reviewed:: Yes (no language interpreter required.) Do you feel safe going back to the place where you live?: Yes      Need for Family Participation in Patient Care: Yes (Comment) Care giver support system in place?: Yes (comment) Current home services: DME (per patient's niece, [name], patient uses shower bench, walker and cane.) Criminal Activity/Legal Involvement Pertinent to Current Situation/Hospitalization: No - Comment as needed  Activities of Daily Living   ADL Screening (condition at time of admission) Independently performs ADLs?: Yes (appropriate for developmental age) Is the patient deaf or have difficulty hearing?: No Does the patient have difficulty seeing, even when wearing glasses/contacts?: No Does the patient have difficulty concentrating, remembering, or making decisions?: No  Permission Sought/Granted Permission sought to share information with : Case Manager   Emotional Assessment Appearance:: Appears stated age Attitude/Demeanor/Rapport: Engaged Affect (typically observed): Calm Orientation: : Oriented to Self, Oriented to Place, Oriented to  Time, Oriented to Situation   Psych Involvement: No (comment)  Admission diagnosis:  Acute on chronic diastolic (congestive) heart failure (HCC) [I50.33] Acute on chronic congestive heart failure, unspecified heart failure type [facility]) [I50.9] Patient Active Problem List   Diagnosis Date Noted   Acute on chronic diastolic (congestive) heart failure (HCC) 03/20/2023   Chronic kidney disease, stage 3a (HCC) 03/20/2023   UTI (urinary tract infection) 11/18/2022   NSTEMI (non-ST elevated myocardial infarction) (HCC) 09/16/2021   Elevated troponin 09/15/2021   Stage 3b chronic kidney disease (CKD) (HCC) 09/15/2021   Partial seizure disorder (HCC)    DVT (deep venous thrombosis) (HCC) 08/19/2017  Orthostatic hypotension 08/18/2017   Diarrhea 08/18/2017   Mild  aortic stenosis    Long-term current use of opiate analgesic 04/06/2017   Chronic low back pain 04/06/2017   Chronic neck pain 04/06/2017   Chronic pain syndrome 04/06/2017   Hyperphosphatemia 11/08/2016   Trigger finger, acquired 09/30/2015   Carpal tunnel syndrome, bilateral 07/31/2015   Abnormal CAT scan 05/04/2015   Breathlessness on exertion 05/04/2015   Abnormal chest CT 05/04/2015   Anemia in chronic kidney disease (CODE) 04/29/2015   Bone disease, metabolic 04/29/2015   Microscopic hematuria 04/29/2015   Avitaminosis D 04/29/2015   Normocytic anemia 03/01/2015   Syncope due to orthostatic hypotension 03/01/2015   Dehydration 03/01/2015   Hypomagnesemia 03/01/2015   Generalized weakness 02/23/2015   Hypokalemia 02/23/2015   AKI (acute kidney injury) (HCC) 02/23/2015   Acute bronchitis 02/23/2015   Hypocalcemia 02/23/2015   ILD (interstitial lung disease) (HCC) 01/24/2015   Chronic respiratory failure (HCC) 12/26/2014   Pulmonary fibrosis (HCC) 12/26/2014   Dyspnea and respiratory abnormality 11/22/2014   Abdominal pain 10/15/2014   Pain in the chest    RUQ abdominal pain    Shortness of breath 10/14/2014   Odynophagia    History of esophageal stricture    Esophageal dysmotility    Renal insufficiency 02/14/2014   Acute renal failure (HCC) 02/14/2014   Nephrolithiasis 02/13/2014   Gross hematuria 02/13/2014   Convulsions/seizures (HCC) 07/26/2013   Pericardial cyst    Mediastinal mass 02/05/2013   Cervical spondylosis 11/17/2011   Chest pain 11/16/2011   Hypertension    Hyperlipidemia    Hypothyroidism    History of  seizures    Depression    Carpal tunnel syndrome    Paroxysmal SVT (supraventricular tachycardia) (HCC) 01/02/2011   CAD in native artery    ANEMIA-UNSPECIFIED 12/12/2009   History of drug coated stent 12/01/2009   GERD 01/19/2008   IBS 01/19/2008   PCP:  Merri Brunette, MD Pharmacy:   [id number] [place], [place] - [address] [place] [address] Phone: [phone] Fax: [phone]  [facility] [address] [place] [address] Phone: [phone] Fax: [phone]  Social Drivers of Health (SDOH) Social History: SDOH Screenings   Food Insecurity: No Food Insecurity (03/21/2023)  Housing: Low Risk  (03/21/2023)  Transportation Needs: No Transportation Needs (03/21/2023)  Utilities: Not At Risk (03/21/2023)  Social Connections: Socially Isolated (03/21/2023)  Tobacco Use: Medium Risk (03/22/2023)   SDOH Interventions:     Readmission Risk Interventions    03/22/2023    1:22 PM 11/26/2022   12:03 PM 11/19/2022   11:49 AM  Readmission Risk Prevention Plan  Transportation Screening Complete Complete Complete  PCP or Specialist Appt within 5-7 Days  Complete Complete  PCP or Specialist Appt within 3-5 Days Complete    Home Care Screening  Complete Complete  Medication Review (RN CM)  Complete Complete  HRI or Home Care Consult Complete    Medication Review (RN Care Manager) Complete

## 2023-03-22 NOTE — Progress Notes (Signed)
 [facility] Progress Note Patient: [name] ZOX:[id number] DOB: 1945/06/14 DOA: 03/20/2023  DOS: the patient was seen and examined on 03/22/2023  Brief Hospital Course: Patient with PMH of CAD with PCI to RCA, recent cath 9/23 with nonobstructive CAD and patent stents, mild aortic stenosis, echo EF 6065% in 2023, HTN, anxiety, chronic pain syndrome, GERD, hypothyroidism, HLD, ILD, CKD 3A presented to the hospital with complaints of leg swelling and shortness of breath. Went to see PCP in 2/27 there was some trace edema noted but no significant shortness of breath.   niece found the patient with very short of breath and therefore patient was brought to the hospital. Currently being treated for acute on chronic HFpEF.  Assessment and Plan: Acute on chronic diastolic CHF. Acute hypoxic respiratory failure. Presents with shortness of breath.  BNP significantly elevated.  Significant swelling in the leg.  Tripoding requiring BiPAP therapy and NRB in the ER. Chest x-ray shows significant vascular congestion.  There is comment about pneumonia although pneumonia is less likely. Echocardiogram 60 to 65% in September 2023. Last cath 9/23 40% stenosis and RPDA and second diagonal, otherwise nonobstructive CAD with patent stent in RCA. Continue BiPAP as needed. Will follow cardiology recommendation. Holding other antihypertensive medications to allow room for aggressive diuresis.  Paroxysmal atrial fibrillation with RVR. History of SVT. Patient has history of SVT and is on Cardizem. Cardizem was on hold on admission to allow room for diuresis. Found to have A-fib with RVR early in the morning of 3/10. IV Lopressor as needed as well as IV amiodarone. Currently on IV heparin as well. RVR could be the cause of her heart failure.  Acute urinary retention. Was unable to urinate on her own with bladder scan showing 400+ cc. With respiratory distress as well as need for aggressive diuresis would  place a Foley catheter. Attempt voiding trial in 48 hours.  Possible cellulitis. Possibility of a cellulitis cannot be ruled out given the presentation of leg swelling with redness and edema and pain. Will treat with cefazolin while on BiPAP. Switch to oral Keflex when successfully can take p.o. not ready yet. Short course recommended.  Elevated troponin. History of CAD. Mild aortic stenosis HTN Last 2023.  Nonobstructive CAD with patent stent. Troponins minimally elevated but EKG does not show any evidence of acute ischemia. Patient does have a history of CAD, continue aspirin.   Cardiogram shows preserved EF.  No significant wall abnormality.   Cardiology consult. There is no indication for therapeutic anticoagulation.  Acute metabolic encephalopathy. Likely in the setting of hypotension. Blood pressure corrected with midodrine with improvement in mentation. Appears to be tired today significantly but able to follow commands.  No focal deficit.  Monitor closely. At risk for aspiration as well.  Poor venous access. RN requested a PICC line.  Placed.  Might help with COX measurement for CHF management as well.  GERD. Continue PPI.  Hypothyroidism. Continuing Synthroid.  Both TSH and free T4 elevated. Initial plan was to reduce Synthroid dose but due to mentation changes we will continue 88 mcg at home to regimen.  History of seizures. Continuing Keppra. Currently on IV Keppra.  Still not ready to take p.o.  Depression. On Cymbalta, currently on hold as unable to swallow due to BiPAP.  As well as due to drowsiness.  HLD. Continuing statin.  Goals of care conversation. Discussed with the patient as well as in presence of niece. Patient would like to continue DNR.   Prolonged QTc.  Minimize QT prolonging medications.  At risk of further worsening in the setting of amiodarone use.  Monitor and EKG and telemetry.   Subjective: No nausea no vomiting.  Overnight was more  drowsy and lethargic.  Currently alert able to follow commands but still tired.  No chest pain.  No abdominal pain.Marland Kitchen  Physical Exam: General: in moderate distress, No Rash Cardiovascular: S1 and S2 Present, No Murmur Respiratory: Good respiratory effort, Bilateral Air entry present.  Improving crackles, No wheezes Abdomen: Bowel Sound present, No tenderness Extremities: Improving edema Neuro: Drowsy and oriented to self and place.  no new focal deficit  Data Reviewed: I have Reviewed nursing notes, Vitals, and Lab results. Since last encounter, pertinent lab results CBC and CMP   . I have ordered test including CBC and BMP  .   Disposition: Status is: Inpatient Remains inpatient appropriate because: Monitor for improvement in mentation and volume status  Family Communication: No one at bedside.  Discussed with niece on 3/10. Level of care: Progressive   Vitals:   03/22/23 0356 03/22/23 0617 03/22/23 0803 03/22/23 1259  BP: (!) 114/90  (!) 127/93 98/76  Pulse: 98 98 99 (!) 110  Resp: 16 16 18 17   Temp: (!) 97.4 F (36.3 C)  (!) 97.5 F (36.4 C) 97.6 F (36.4 C)  TempSrc: Axillary  Oral Oral  SpO2: 100% 94% 99% 100%  Weight: 53.1 kg     Height:         Author: Lynden Oxford, MD 03/22/2023 6:16 PM  Please look on [email or web address] to find out who is on call.

## 2023-03-22 NOTE — Progress Notes (Signed)
 Peripherally Inserted Central Catheter Placement  The IV Nurse has discussed with the patient and/or persons authorized to consent for the patient, the purpose of this procedure and the potential benefits and risks involved with this procedure.  The benefits include less needle sticks, lab draws from the catheter, and the patient may be discharged home with the catheter. Risks include, but not limited to, infection, bleeding, blood clot (thrombus formation), and puncture of an artery; nerve damage and irregular heartbeat and possibility to perform a PICC exchange if needed/ordered by physician.  Alternatives to this procedure were also discussed.  Bard Power PICC patient education guide, fact sheet on infection prevention and patient information card has been provided to patient /or left at bedside.  PICC placed by Reginia Forts, RN.  Telephone consent obtained from niece due to altered mental status.  PICC Placement Documentation  PICC Double Lumen 03/22/23 Right Basilic 32 cm 0 cm (Active)  Indication for Insertion or Continuance of Line Vasoactive infusions 03/22/23 1604  Exposed Catheter (cm) 0 cm 03/22/23 1604  Site Assessment Clean, Dry, Intact 03/22/23 1604  Lumen #1 Status Flushed;Saline locked;Blood return noted 03/22/23 1604  Lumen #2 Status Flushed;Saline locked;Blood return noted 03/22/23 1604  Dressing Type Transparent;Securing device 03/22/23 1604  Dressing Status Antimicrobial disc/dressing in place;Clean, Dry, Intact 03/22/23 1604  Line Care Connections checked and tightened 03/22/23 1604  Line Adjustment (NICU/IV Team Only) No 03/22/23 1604  Dressing Intervention New dressing 03/22/23 1604  Dressing Change Due 03/29/23 03/22/23 1604       Najma Bozarth, Lajean Manes 03/22/2023, 4:05 PM

## 2023-03-22 NOTE — Progress Notes (Signed)
 R FA 20G site pink and hard to touch. Currently has amio infusing. IV team placed a third iv on L fa. Amio was switched to L fa iv and R fa iv was removed. Pharmacy was called and RN was told to use cold compresses on site as patient wasn't experiencing any pain and site was slightly pink. RN to continue monitoring site.   Provider paged for PICC line orders at 1142 and orders were placed.

## 2023-03-22 NOTE — Progress Notes (Signed)
 Orthopedic Tech Progress Note Patient Details:  [name] [birth year] [id number]  Ortho Devices Type of Ortho Device: Radio broadcast assistant Ortho Device/Splint Location: BLE Ortho Device/Splint Interventions: Ordered, Application   Post Interventions Patient Tolerated: Well  Lovett Calender 03/22/2023, 6:54 PM

## 2023-03-22 NOTE — Progress Notes (Signed)
 PHARMACY - ANTICOAGULATION CONSULT NOTE  Pharmacy Consult for heparin  Indication: atrial fibrillation  No Known Allergies  Patient Measurements: Height: 5' (152.4 cm) Weight: 53.1 kg (117 lb 1 oz) IBW/kg (Calculated) : 45.5 Heparin Dosing Weight: 50 kg   Vital Signs: Temp: 97.6 F (36.4 C) (03/11 1259) Temp Source: Oral (03/11 1259) BP: 98/76 (03/11 1259) Pulse Rate: 92 (03/11 1941)  Labs: Recent Labs    03/20/23 1336 03/20/23 1545 03/21/23 0407 03/22/23 0013 03/22/23 0447 03/22/23 0858 03/22/23 1843  HGB 10.6*  --  9.5*  --  9.5*  --   --   HCT 34.1*  --  29.9*  --  29.6*  --   --   PLT 252  --  256  --  300  --   --   HEPARINUNFRC  --   --   --  0.17*  --  0.25* 0.21*  CREATININE 1.15*  --  1.42*  --  1.77*  --   --   TROPONINIHS 72* 88*  --   --   --   --   --     Estimated Creatinine Clearance: 19.4 mL/min (A) (by C-G formula based on SCr of 1.77 mg/dL (H)).   Medical History: Past Medical History:  Diagnosis Date   Anxiety    Arthritis    Blood transfusion without reported diagnosis    childhood   Carpal tunnel syndrome, bilateral 07/31/2015   Cataract    Cervical spondylosis without myelopathy    Chronic chest pain    Chronic kidney disease    Chronic low back pain    Chronotropic incompetence    CKD (chronic kidney disease), stage III (HCC)    Coronary artery disease    a. inf-post MI 2011 s/p DES to RCA. b. DES to RCA 06/2010. c. DES to LAD 2014, residual dz treated medically.   Depression    DVT (deep venous thrombosis) (HCC)    left   Esophageal stricture    GERD (gastroesophageal reflux disease)    Gross hematuria    Heart murmur    History of esophageal dilatation    FOR STRIUCTURE   History of gout    History of hiatal hernia    History of kidney stones    Hyperlipidemia    Hypertension    Hypothyroidism    Internal hemorrhoids    Irritable bowel syndrome    Mild aortic stenosis    by echo 2018   Myocardial infarction (HCC)     Obesity    Orthostatic hypotension    Partial seizure disorder (HCC) NEUROLOGIST-- DR Anne Hahn   NOCTURNAL   PONV (postoperative nausea and vomiting)    hard time getting iv site-had to do neck stick 2 yrs ago   Pulmonary fibrosis (HCC)    S/P pericardial cyst excision    02-05-2013  benign   Seizures (HCC)    none in yrs   SVT (supraventricular tachycardia) (HCC)    Trigger finger, acquired 09/30/2015   Right middle finger   Vitamin D deficiency    Weakness     Assessment: Patient admitted for CC of SOB with leg swelling. Patient with hx of SVT, now in Afib with RVR. Plans are for possible DCCV  Heparin level trended down, at 0.21. Confirmed withRN, no issues with PIV position, no issues or interruptions with heparin infusion noted.  Goal of Therapy:  Heparin level 0.3-0.7 units/ml Monitor platelets by anticoagulation protocol: Yes   Plan:  Increase heparin  infusion to 1000 units/hr Check heparin level in 8 hours and daily while on heparin Continue to monitor H&H and platelets  Thank you for allowing pharmacy to be a part of this patient's care.  Thelma Barge, PharmD, BCPS Clinical Pharmacist

## 2023-03-22 NOTE — Progress Notes (Addendum)
 Patient Name: [name] Date of Encounter: 03/22/2023 [facility] Cardiologist: Armanda Magic, MD   Interval Summary  .    Patient very tired, resting in the bed. States she has been up all night and that's why she is tired. Currently on 4 L oxygen via [place] Unwilling to answer most of my questions but does not appear to be in any acute distress   Notes from overnight show patient was drowsy and somnolent with BP 83/58, rapid response was called. Primary MD held amiodarone, ordered stat ABG and started patient on BiPAP. Patient was given midodrine 10 mg x 1 at 11:36 PM. Cardiology was made aware and restarted amiodarone at 12:48 AM. Patients BP seems to have recovered since this episode and she is currently on 4 L oxygen via [place]  Vital Signs .    Vitals:   03/22/23 0318 03/22/23 0356 03/22/23 0617 03/22/23 0803  BP: 116/76 (!) 114/90  (!) 127/93  Pulse: 96 98 98 99  Resp: (!) 28 16 16 18   Temp:  (!) 97.4 F (36.3 C)  (!) 97.5 F (36.4 C)  TempSrc:  Axillary  Oral  SpO2:  100% 94% 99%  Weight:  53.1 kg    Height:       Intake/Output Summary (Last 24 hours) at 03/22/2023 1112 Last data filed at 03/22/2023 0945 Gross per 24 hour  Intake 352.68 ml  Output 300 ml  Net 52.68 ml      03/22/2023    3:56 AM 03/20/2023    1:12 PM 02/07/2023    9:17 AM  Last 3 Weights  Weight (lbs) 117 lb 1 oz 110 lb 91 lb  Weight (kg) 53.1 kg 49.896 kg 41.277 kg     Telemetry/ECG    Atrial fibrillation HR 90-100s - Personally Reviewed  Physical Exam .   GEN: No acute distress, drowsy, on 4 L oxygen via [place].   Cardiac: irregular rhythm, regular rate, no murmurs, rubs, or gallops.  Respiratory: bilateral crackles  GI: Soft, nontender, non-distended  MS: bilateral 2+ pitting edema, concerns for possible cellulitis   Assessment & Plan .   [name] is a 78 y.o. female with a hx of HFpEF, CAD s/p multiple interventions (DES to RCA in 2011 for MI, DES to RCA on 06/2010, DES to LAD in  2014), hypertension, hyperlipidemia, aortic stenosis, esophageal strictures s/p dilation, GERD, hypothyroidism, renal insufficiency, chronic respiratory failure, interstitial lung disease, DVT in 2019, pericardial cyst s/p resection who is admitted for acute on chronic HFpEF, A. Fib  Acute on chronic HFpEF Moderate low flow, low gradient aortic stenosis  Echo this admission showed: EF 60-65%, moderate LVH, moderately reduced RV function, moderately enlarged RV, biatrial enlargement, mild MR, mild to moderate TR, low-flow, low-gradient AS (mean gradient 8 mmHg), dilated IVC  Weight today 117 lb Net - 2.3 L this admission, appears to only put out 300 cc yesterday  CXR showed significant vascular congestion -- edema vs. PNA Creatinine elevated to 1.77 today, 1.42 yesterday  Held IV Lasix -- with rise in creatinine, low urine output, episode of hypotension Continue strict I&O's, daily weights  Paroxysmal atrial fibrillation with RVR CHADS2VASc score is 7 Most recent HR 98 Currently in atrial fibrillation with HR in the 90s  BNP 1,056 TSH elevated at 6.3, free T4 elevated 1.48 -- known history of hypothyroidism, currently on synthroid  Continue IV amiodarone  -- was briefly held last night by primary secondary to patient being somnolent and  hypotensive, BP seemed to recover  Continue IV heparin -- will need DOAC once hemoglobin recovers, currently 9.5 was 12.2 in 11/2022  Consider inpatient TEE guided DCCV if does not spontaneously convert   CAD s/p DES to RCA 2011, DES to RCA 06/2010, DES to LAD 2014 Troponin 72 > 88 Continue ASA 81 mg daily Continue Crestor 40 mg daily   Hypertension Patient has been hypotensive this admission Most recent BP 114/90  Hyperlipidemia  Continue Crestor 40 mg daily   Per primary Hypothyroidism Possible cellulitis, PNA? GERD Normocytic anemia Seizure disorder  Electrolyte disturbances  For questions or updates, please contact [facility] Please consult [email or web address] for contact info under  Signed, Thomasene Ripple, DO   Patient seen and examined, note reviewed with the signed Advanced Practice Provider. I personally reviewed laboratory data, imaging studies and relevant notes. I independently examined the patient and formulated the important aspects of the plan. I have personally discussed the plan with the patient and/or family. Comments or changes to the note/plan are indicated below.  Patient seen and examined at her bedside. She tells me that she is improving on the breathing.  Got lasix yesterday - output not really optimal and with increased cr agree with stopping the lasix and monitor  Please continue the Amiodarone drip for now.  She is rate control if symptoms continue will likely consider TEE/cardioversion in the hospital.  She has been on heparin drip hemoglobin is reflective of anemia is a drop from November 2024.  But if this remains stable we will transition the patient to Eliquis.  If not converted by tomorrow we will set the patient up for TEE guided DCCV.  Episode overnight noted with transient hypotension and was status post midodrine.   Thomasene Ripple DO, MS [facility] Attending Cardiologist [facility]  [address] [place], [place] [address] [phone] Website: [email or web address]

## 2023-03-22 NOTE — Telephone Encounter (Signed)
 Patient Product/process development scientist completed.    The patient is insured through [employer]. Patient has Medicare and is not eligible for a copay card, but may be able to apply for patient assistance or Medicare RX Payment Plan (Patient Must reach out to their plan, if eligible for payment plan), if available.    Ran test claim for Eliquis 5 mg and the current 30 day co-pay is $266.76 due to a deductilbe.  Ran test claim for Xarelto 20 mg and the current 30 day co-pay is $264.84 due to a deductilbe.   This test claim was processed through [facility]- copay amounts may vary at other pharmacies due to pharmacy/plan contracts, or as the patient moves through the different stages of their insurance plan.     Roland Earl, CPHT Pharmacy Technician III Certified Patient Advocate [facility] Patient Advocate Team Direct Number: [phone]  Fax: [phone]

## 2023-03-22 NOTE — Progress Notes (Signed)
 Received a call from bedside RN regarding the patient being hyper somnolent and hypotensive with SBP in the 80s. Presented at bedside.  The patient is somnolent.  IV amiodarone held due to hypotension and 1 dose of 10 mg midodrine administered with improvement of blood pressure.  The patient became more alert.  States it is hard for her to breathe.  Tachypneic, using accessory muscles to breathe on 4 L nasal cannula.  O2 saturation in the low 90s.  Stat ABG ordered.  Placed on BiPAP by RT.   Returned to reassess the patient.  She is tolerating BiPAP.  Cardiology made aware by RN of change in clinical status.  Amiodarone restarted per cardiology's order.  Will continue to closely monitor and treat as indicated.   Time: 25 minutes.

## 2023-03-22 NOTE — Progress Notes (Signed)
   03/22/23 0617  Provider Notification  Provider Name/Title Dr Margo Aye  Date Provider Notified 03/22/23  Time Provider Notified [phone]  Method of Notification Page (secured chat)  Notification Reason Critical Result  Test performed and critical result Calcium 6.0  Date Critical Result Received 03/22/23  Time Critical Result Received 0616  Provider response See new orders  Date of Provider Response 03/22/23  Time of Provider Response 0619   Felicity Coyer, RN

## 2023-03-23 DIAGNOSIS — I5033 Acute on chronic diastolic (congestive) heart failure: Secondary | ICD-10-CM | POA: Diagnosis not present

## 2023-03-23 LAB — CBC WITH DIFFERENTIAL/PLATELET
Abs Immature Granulocytes: 0.04 10*3/uL (ref 0.00–0.07)
Basophils Absolute: 0 10*3/uL (ref 0.0–0.1)
Basophils Relative: 0 %
Eosinophils Absolute: 0.3 10*3/uL (ref 0.0–0.5)
Eosinophils Relative: 2 %
HCT: 28.7 % — ABNORMAL LOW (ref 36.0–46.0)
Hemoglobin: 9.3 g/dL — ABNORMAL LOW (ref 12.0–15.0)
Immature Granulocytes: 0 %
Lymphocytes Relative: 7 %
Lymphs Abs: 0.7 10*3/uL (ref 0.7–4.0)
MCH: 29.7 pg (ref 26.0–34.0)
MCHC: 32.4 g/dL (ref 30.0–36.0)
MCV: 91.7 fL (ref 80.0–100.0)
Monocytes Absolute: 1 10*3/uL (ref 0.1–1.0)
Monocytes Relative: 9 %
Neutro Abs: 8.7 10*3/uL — ABNORMAL HIGH (ref 1.7–7.7)
Neutrophils Relative %: 82 %
Platelets: 313 10*3/uL (ref 150–400)
RBC: 3.13 MIL/uL — ABNORMAL LOW (ref 3.87–5.11)
RDW: 14 % (ref 11.5–15.5)
WBC: 10.7 10*3/uL — ABNORMAL HIGH (ref 4.0–10.5)
nRBC: 0 % (ref 0.0–0.2)

## 2023-03-23 LAB — CBC
HCT: 26.5 % — ABNORMAL LOW (ref 36.0–46.0)
Hemoglobin: 8.6 g/dL — ABNORMAL LOW (ref 12.0–15.0)
MCH: 29.8 pg (ref 26.0–34.0)
MCHC: 32.5 g/dL (ref 30.0–36.0)
MCV: 91.7 fL (ref 80.0–100.0)
Platelets: 269 10*3/uL (ref 150–400)
RBC: 2.89 MIL/uL — ABNORMAL LOW (ref 3.87–5.11)
RDW: 14 % (ref 11.5–15.5)
WBC: 10.5 10*3/uL (ref 4.0–10.5)
nRBC: 0.2 % (ref 0.0–0.2)

## 2023-03-23 LAB — RENAL FUNCTION PANEL
Albumin: 2.4 g/dL — ABNORMAL LOW (ref 3.5–5.0)
Anion gap: 13 (ref 5–15)
BUN: 56 mg/dL — ABNORMAL HIGH (ref 8–23)
CO2: 21 mmol/L — ABNORMAL LOW (ref 22–32)
Calcium: 6.4 mg/dL — CL (ref 8.9–10.3)
Chloride: 104 mmol/L (ref 98–111)
Creatinine, Ser: 1.91 mg/dL — ABNORMAL HIGH (ref 0.44–1.00)
GFR, Estimated: 27 mL/min — ABNORMAL LOW (ref >60)
Glucose, Bld: 161 mg/dL — ABNORMAL HIGH (ref 70–99)
Phosphorus: 5.7 mg/dL — ABNORMAL HIGH (ref 2.5–4.6)
Potassium: 3.3 mmol/L — ABNORMAL LOW (ref 3.5–5.1)
Sodium: 138 mmol/L (ref 135–145)

## 2023-03-23 LAB — MAGNESIUM: Magnesium: 2.3 mg/dL (ref 1.7–2.4)

## 2023-03-23 LAB — HEPARIN LEVEL (UNFRACTIONATED): Heparin Unfractionated: 0.54 [IU]/mL (ref 0.30–0.70)

## 2023-03-23 MED ORDER — CALCIUM GLUCONATE-NACL 2-0.675 GM/100ML-% IV SOLN
2.0000 g | Freq: Once | INTRAVENOUS | Status: AC
  Administered 2023-03-23: 2000 mg via INTRAVENOUS
  Filled 2023-03-23: qty 100

## 2023-03-23 MED ORDER — POTASSIUM CHLORIDE CRYS ER 20 MEQ PO TBCR
40.0000 meq | EXTENDED_RELEASE_TABLET | Freq: Once | ORAL | Status: AC
  Administered 2023-03-23: 40 meq via ORAL
  Filled 2023-03-23: qty 2

## 2023-03-23 MED ORDER — LEVOTHYROXINE SODIUM 75 MCG PO TABS
75.0000 ug | ORAL_TABLET | Freq: Every day | ORAL | Status: DC
  Administered 2023-03-24 – 2023-03-30 (×7): 75 ug via ORAL
  Filled 2023-03-23 (×7): qty 1

## 2023-03-23 MED ORDER — LEVETIRACETAM 500 MG PO TABS
500.0000 mg | ORAL_TABLET | Freq: Two times a day (BID) | ORAL | Status: DC
  Administered 2023-03-23 – 2023-04-01 (×19): 500 mg via ORAL
  Filled 2023-03-23 (×19): qty 1

## 2023-03-23 NOTE — Progress Notes (Addendum)
 PROGRESS NOTE    [name]  ZOX:[id number] DOB: 09-Mar-1945 DOA: 03/20/2023 PCP: Merri Brunette, MD   Brief Narrative:  Patient with PMH of CAD with PCI to RCA, recent cath 9/23 with nonobstructive CAD and patent stents, mild aortic stenosis, echo EF 6065% in 2023, HTN, anxiety, chronic pain syndrome, GERD, hypothyroidism, HLD, ILD, CKD 3A presented to the hospital with complaints of leg swelling and shortness of breath. Went to see PCP in 2/27 there was some trace edema noted but no significant shortness of breath.   niece found the patient with very short of breath and therefore patient was brought to the hospital. Currently being treated for acute on chronic HFpEF.  Assessment & Plan:   Principal Problem:   Acute on chronic diastolic (congestive) heart failure (HCC) Active Problems:   Hypertension   Hyperlipidemia   Hypothyroidism   History of  seizures   Depression   CAD in native artery   GERD   IBS   ILD (interstitial lung disease) (HCC)   Mild aortic stenosis   Chronic pain syndrome   Chronic kidney disease, stage 3a (HCC)  Acute hypoxic respiratory failure secondary to acute on chronic diastolic CHF: BNP significantly elevated 1056.  Significant swelling in the leg.  Tripoding requiring BiPAP therapy and NRB in the ER. Chest x-ray shows significant vascular congestion.  There is comment about pneumonia although pneumonia is less likely. Echocardiogram 60 to 65% in September 2023. Last cath 9/23 40% stenosis and RPDA and second diagonal, otherwise nonobstructive CAD with patent stent in RCA. Continue BiPAP as needed.  Cardiology following and managing.  She is not on any diuretics.  AKI on CKD stage IIIb: Baseline creatinine appears to be around 1.3-1.5, now 1.91 today.  No diuretics and no nephrotoxic agents.  Repeat labs in the morning.  Hypokalemia: Will replenish.  Acute anemia: Almost 3 months ago, patient's hemoglobin was normal.  She presents with 10.6 which  has gradually dropped to 8.6.  No reports of hematemesis, melena or hematochezia.  Cardiology checking FOBT.  If positive, will need cardiology consultation.  Monitor hemoglobin daily.  However I will check 1 more time later today.   History of SVT but here with paroxysmal atrial fibrillation with RVR. Patient has history of SVT and is on Cardizem PTA. Cardizem was on hold on admission to allow room for diuresis. Found to have A-fib with RVR early in the morning of 3/10.  Cardiology managing, currently on amiodarone and heparin drip.   Acute urinary retention. Was unable to urinate on her own with bladder scan showing 400+ cc.  Placed Foley catheter.  Continue for now.   Possible cellulitis. Patient was presumed to have cellulitis of bilateral legs based on appearance.  She was started on cefazolin.  On my examination, I did not see any erythema, no warmth or tenderness.  Looks like antibiotics were started on 03/20/2023, will continue for total of 5 to 7 days.   History of CAD and mild aortic stenosis/elevated troponin. Last 2023.  Nonobstructive CAD with patent stent.Troponins minimally elevated but EKG does not show any evidence of acute ischemia.  Echo shows preserved EF.  No significant wall abnormality.  Likely demand ischemia.  Hypertension: Blood pressure on the low side.  All antihypertensives on hold.   Acute metabolic encephalopathy. Likely in the setting of hypotension. Per previous hospitalist documentation  "blood pressure corrected with midodrine with improvement in mentation".  However I do not see any midodrine ordered.  Patient is  alert and oriented, needs some prompts though.   Poor venous access. RN requested a PICC line.  Placed.    GERD. Continue PPI.   Hypothyroidism. Continuing Synthroid.  Both TSH and free T4 elevated.  I will reduce Synthroid from 88 mcg to 75 mcg.  Repeat TSH in 6 to 8 weeks.   History of seizures. Continuing Keppra.  Switching to oral.    Depression. On Cymbalta, will resume tomorrow.   HLD. Continuing statin.   Goals of care conversation. Discussed with the patient as well as in presence of niece. Patient would like to continue DNR.    Prolonged QTc. Minimize QT prolonging medications.  At risk of further worsening in the setting of amiodarone use.  Monitor and EKG and telemetry.   DVT prophylaxis: Heparin   Code Status: Do not attempt resuscitation (DNR) PRE-ARREST INTERVENTIONS DESIRED  Family Communication:  None present at bedside.  Plan of care discussed with patient in length and he/she verbalized understanding and agreed with it.  Status is: Inpatient Remains inpatient appropriate because: Needs inpatient management.   Estimated body mass index is 22.95 kg/m as calculated from the following:   Height as of this encounter: 5' (1.524 m).   Weight as of this encounter: 53.3 kg.    Nutritional Assessment: Body mass index is 22.95 kg/m.Marland Kitchen Seen by dietician.  I agree with the assessment and plan as outlined below: Nutrition Status:        . Skin Assessment: I have examined the patient's skin and I agree with the wound assessment as performed by the wound care RN as outlined below:    Consultants:  Cardiology  Procedures:  None  Antimicrobials:  Anti-infectives (From admission, onward)    Start     Dose/Rate Route Frequency Ordered Stop   03/20/23 2200  ceFAZolin (ANCEF) IVPB 1 g/50 mL premix        1 g 100 mL/hr over 30 Minutes Intravenous Every 12 hours 03/20/23 1754 03/24/23 0959         Subjective: Patient seen and examined, she is fully alert and oriented.  She has no complaints at all.  Objective: Vitals:   03/23/23 0505 03/23/23 0519 03/23/23 0654 03/23/23 0825  BP: 101/70   107/80  Pulse: (!) 104 (!) 113 (!) 103 (!) 113  Resp: 20 16 13 16   Temp: (!) 97.5 F (36.4 C)   97.7 F (36.5 C)  TempSrc: Oral   Oral  SpO2: 96% 95% 96% 97%  Weight:      Height:         Intake/Output Summary (Last 24 hours) at 03/23/2023 0942 Last data filed at 03/23/2023 0653 Gross per 24 hour  Intake 1439.41 ml  Output --  Net 1439.41 ml   Filed Weights   03/20/23 1312 03/22/23 0356 03/23/23 0500  Weight: 49.9 kg 53.1 kg 53.3 kg    Examination:  General exam: Appears calm and comfortable  Respiratory system: Bibasilar crackles and left middle lobe crackles.  Respiratory effort normal. Cardiovascular system: S1 & S2 heard, RRR. No JVD, murmurs, rubs, gallops or clicks.  +1-2 pitting edema bilateral lower extremity. Gastrointestinal system: Abdomen is nondistended, soft and nontender. No organomegaly or masses felt. Normal bowel sounds heard. Central nervous system: Alert and oriented. No focal neurological deficits. Extremities: Symmetric 5 x 5 power. Skin: No rashes, lesions or ulcers  Data Reviewed: I have personally reviewed following labs and imaging studies  CBC: Recent Labs  Lab 03/20/23 1336 03/21/23 0407 03/22/23  0447 03/23/23 0405  WBC 15.6* 16.4* 14.5* 10.5  HGB 10.6* 9.5* 9.5* 8.6*  HCT 34.1* 29.9* 29.6* 26.5*  MCV 93.4 92.9 92.5 91.7  PLT 252 256 300 269   Basic Metabolic Panel: Recent Labs  Lab 03/20/23 1336 03/21/23 0407 03/21/23 0830 03/22/23 0447 03/23/23 0405  NA 140 139  --  136 138  K 4.3 3.3*  --  4.1 3.3*  CL 111 106  --  106 104  CO2 15* 20*  --  19* 21*  GLUCOSE 108* 94  --  105* 161*  BUN 42* 42*  --  54* 56*  CREATININE 1.15* 1.42*  --  1.77* 1.91*  CALCIUM 7.4* 6.8*  --  6.0* 6.4*  MG  --   --  1.0* 2.5* 2.3  PHOS  --   --   --  8.6* 5.7*   GFR: Estimated Creatinine Clearance: 18 mL/min (A) (by C-G formula based on SCr of 1.91 mg/dL (H)). Liver Function Tests: Recent Labs  Lab 03/21/23 0407 03/22/23 0447 03/23/23 0405  AST 40  --   --   ALT 26  --   --   ALKPHOS 72  --   --   BILITOT 1.5*  --   --   PROT 5.7*  --   --   ALBUMIN 2.7* 2.7* 2.4*   No results for input(s): "LIPASE", "AMYLASE" in the  last 168 hours. No results for input(s): "AMMONIA" in the last 168 hours. Coagulation Profile: No results for input(s): "INR", "PROTIME" in the last 168 hours. Cardiac Enzymes: No results for input(s): "CKTOTAL", "CKMB", "CKMBINDEX", "TROPONINI" in the last 168 hours. BNP (last 3 results) No results for input(s): "PROBNP" in the last 8760 hours. HbA1C: No results for input(s): "HGBA1C" in the last 72 hours. CBG: Recent Labs  Lab 03/21/23 2241  GLUCAP 115*   Lipid Profile: No results for input(s): "CHOL", "HDL", "LDLCALC", "TRIG", "CHOLHDL", "LDLDIRECT" in the last 72 hours. Thyroid Function Tests: Recent Labs    03/21/23 0407  TSH 6.328*  FREET4 1.48*   Anemia Panel: No results for input(s): "VITAMINB12", "FOLATE", "FERRITIN", "TIBC", "IRON", "RETICCTPCT" in the last 72 hours. Sepsis Labs: Recent Labs  Lab 03/21/23 0830  PROCALCITON 0.72    Recent Results (from the past 240 hours)  Resp panel by RT-PCR (RSV, Flu A&B, Covid) Anterior Nasal Swab     Status: None   Collection Time: 03/20/23  2:00 PM   Specimen: Anterior Nasal Swab  Result Value Ref Range Status   SARS Coronavirus 2 by RT PCR NEGATIVE NEGATIVE Final   Influenza A by PCR NEGATIVE NEGATIVE Final   Influenza B by PCR NEGATIVE NEGATIVE Final    Comment: (NOTE) The Xpert Xpress SARS-CoV-2/FLU/RSV plus assay is intended as an aid in the diagnosis of influenza from Nasopharyngeal swab specimens and should not be used as a sole basis for treatment. Nasal washings and aspirates are unacceptable for Xpert Xpress SARS-CoV-2/FLU/RSV testing.  Fact Sheet for Patients: BloggerCourse.com  Fact Sheet for Healthcare Providers: SeriousBroker.it  This test is not yet approved or cleared by the [place] FDA and has been authorized for detection and/or diagnosis of SARS-CoV-2 by FDA under an Emergency Use Authorization (EUA). This EUA will remain in effect  (meaning this test can be used) for the duration of the COVID-19 declaration under Section 564(b)(1) of the Act, 21 U.S.C. section 360bbb-3(b)(1), unless the authorization is terminated or revoked.     Resp Syncytial Virus by PCR NEGATIVE NEGATIVE Final  Comment: (NOTE) Fact Sheet for Patients: BloggerCourse.com  Fact Sheet for Healthcare Providers: SeriousBroker.it  This test is not yet approved or cleared by the [place] FDA and has been authorized for detection and/or diagnosis of SARS-CoV-2 by FDA under an Emergency Use Authorization (EUA). This EUA will remain in effect (meaning this test can be used) for the duration of the COVID-19 declaration under Section 564(b)(1) of the Act, 21 U.S.C. section 360bbb-3(b)(1), unless the authorization is terminated or revoked.  Performed at [facility], [address], [place], [place] [address]      Radiology Studies: DG CHEST PORT 1 VIEW Result Date: 03/22/2023 CLINICAL DATA:  Central line EXAM: PORTABLE CHEST 1 VIEW COMPARISON:  Chest x-ray 03/20/2023 FINDINGS: Right upper extremity PICC terminates in the distal SVC. There is no pneumothorax. Sternotomy wires are present. The cardiomediastinal silhouette is stable, the heart is mildly enlarged. Bilateral multifocal airspace disease has not significantly changed. No pleural effusion or pneumothorax. No acute fractures are seen. IMPRESSION: 1. Right upper extremity PICC terminates in the distal SVC. No pneumothorax. 2. Stable bilateral multifocal airspace disease. Electronically Signed   By: Darliss Cheney M.D.   On: 03/22/2023 18:54   [place] EKG SITE RITE Result Date: 03/22/2023 If Site Rite image not attached, placement could not be confirmed due to current cardiac rhythm.  ECHOCARDIOGRAM COMPLETE Result Date: 03/21/2023    ECHOCARDIOGRAM REPORT   Patient Name:   [name] Date of Exam: 03/21/2023 Medical Rec #:  [id number]       Height:       60.0 in Accession #:    [id number]     Weight:       110.0 lb Date of Birth:  July 13, 1945      BSA:          1.448 m Patient Age:    77 years       BP:           97/62 mmHg Patient Gender: F              HR:           154 bpm. Exam Location:  Inpatient Procedure: 2D Echo, Cardiac Doppler and Color Doppler (Both Spectral and Color            Flow Doppler were utilized during procedure). Indications:    Congestive heart failure  History:        Patient has prior history of Echocardiogram examinations. CHF,                 CAD, Arrythmias:Atrial Fibrillation; Risk Factors:Hypertension.  Sonographer:    Lamont Snowball Referring Phys: Zachery Conch PATEL  Sonographer Comments: Image acquisition challenging due to uncooperative patient. IMPRESSIONS  1. Left ventricular ejection fraction, by estimation, is 60 to 65%. The left ventricle has normal function. The left ventricle has no regional wall motion abnormalities. There is moderate concentric left ventricular hypertrophy. Left ventricular diastolic parameters are indeterminate.  2. Right ventricular systolic function is moderately reduced. The right ventricular size is moderately enlarged.  3. Left atrial size was moderately dilated.  4. Right atrial size was severely dilated.  5. The mitral valve is degenerative. Mild mitral valve regurgitation. Moderate mitral annular calcification.  6. Tricuspid valve regurgitation is mild to moderate.  7. Suspect moderate low-flow/low-gradient AS. Marland Kitchen The aortic valve is tricuspid. There is moderate calcification of the aortic valve. Aortic valve regurgitation is not visualized. Moderate aortic valve stenosis. Aortic valve area,  by VTI measures 1.10 cm. Aortic valve mean gradient measures 8.0 mmHg. Aortic valve Vmax measures 1.88 m/s.  8. The inferior vena cava is dilated in size with <50% respiratory variability, suggesting right atrial pressure of 15 mmHg. Conclusion(s)/Recommendation(s): PAtient in rapid AF at time of  study. FINDINGS  Left Ventricle: Left ventricular ejection fraction, by estimation, is 60 to 65%. The left ventricle has normal function. The left ventricle has no regional wall motion abnormalities. The left ventricular internal cavity size was normal in size. There is  moderate concentric left ventricular hypertrophy. Left ventricular diastolic parameters are indeterminate. Right Ventricle: The right ventricular size is moderately enlarged. No increase in right ventricular wall thickness. Right ventricular systolic function is moderately reduced. Left Atrium: Left atrial size was moderately dilated. Right Atrium: Right atrial size was severely dilated. Pericardium: There is no evidence of pericardial effusion. Mitral Valve: The mitral valve is degenerative in appearance. There is mild calcification of the mitral valve leaflet(s). Moderate mitral annular calcification. Mild mitral valve regurgitation. MV peak gradient, 6.5 mmHg. The mean mitral valve gradient is 4.0 mmHg. Tricuspid Valve: The tricuspid valve is normal in structure. Tricuspid valve regurgitation is mild to moderate. Aortic Valve: Suspect moderate low-flow/low-gradient AS. The aortic valve is tricuspid. There is moderate calcification of the aortic valve. Aortic valve regurgitation is not visualized. Moderate aortic stenosis is present. Aortic valve mean gradient measures 8.0 mmHg. Aortic valve peak gradient measures 14.2 mmHg. Aortic valve area, by VTI measures 1.10 cm. Pulmonic Valve: The pulmonic valve was normal in structure. Pulmonic valve regurgitation is trivial. Aorta: The aortic root is normal in size and structure. Venous: The inferior vena cava is dilated in size with less than 50% respiratory variability, suggesting right atrial pressure of 15 mmHg. IAS/Shunts: No atrial level shunt detected by color flow Doppler.  LEFT VENTRICLE PLAX 2D LVIDd:         2.80 cm LVIDs:         2.20 cm LV PW:         1.30 cm LV IVS:        1.20 cm LVOT  diam:     1.90 cm LV SV:         31 LV SV Index:   21 LVOT Area:     2.84 cm  RIGHT VENTRICLE          IVC RV Basal diam:  4.50 cm  IVC diam: 1.90 cm LEFT ATRIUM             Index        RIGHT ATRIUM           Index LA Vol (A2C):   45.8 ml 31.63 ml/m  RA Area:     16.80 cm LA Vol (A4C):   30.4 ml 21.00 ml/m  RA Volume:   47.60 ml  32.88 ml/m LA Biplane Vol: 38.0 ml 26.25 ml/m  AORTIC VALVE AV Area (Vmax):    1.25 cm AV Area (Vmean):   1.17 cm AV Area (VTI):     1.10 cm AV Vmax:           188.50 cm/s AV Vmean:          136.500 cm/s AV VTI:            0.282 m AV Peak Grad:      14.2 mmHg AV Mean Grad:      8.0 mmHg LVOT Vmax:         83.33 cm/s  LVOT Vmean:        56.233 cm/s LVOT VTI:          0.109 m LVOT/AV VTI ratio: 0.39  AORTA Ao Root diam: 2.60 cm Ao Asc diam:  2.90 cm MITRAL VALVE MV Area (PHT): 6.13 cm     SHUNTS MV Area VTI:   2.46 cm     Systemic VTI:  0.11 m MV Peak grad:  6.5 mmHg     Systemic Diam: 1.90 cm MV Mean grad:  4.0 mmHg MV Vmax:       1.27 m/s MV Vmean:      95.4 cm/s MV Decel Time: 124 msec MV E velocity: 115.67 cm/s Arvilla Meres MD Electronically signed by Arvilla Meres MD Signature Date/Time: 03/21/2023/1:52:00 PM    Final     Scheduled Meds:  aspirin EC  81 mg Oral Daily   Chlorhexidine Gluconate Cloth  6 each Topical Daily   levothyroxine  88 mcg Oral q morning   pantoprazole  40 mg Oral BID   potassium chloride  40 mEq Oral Once   rosuvastatin  40 mg Oral Daily   senna-docusate  1 tablet Oral BID   sodium chloride flush  10-40 mL Intracatheter Q12H   Continuous Infusions:  amiodarone 30 mg/hr (03/22/23 2151)    ceFAZolin (ANCEF) IV 1 g (03/22/23 2113)   heparin 1,000 Units/hr (03/22/23 1952)   levETIRAcetam 500 mg (03/22/23 2156)     LOS: 3 days   Hughie Closs, MD [facility]  03/23/2023, 9:42 AM   *Please note that this is a verbal dictation therefore any spelling or grammatical errors are due to the "Dragon [employer] One" system  interpretation.  Please page via Amion and do not message via secure chat for urgent patient care matters. Secure chat can be used for non urgent patient care matters.  How to contact the [facility] Attending or Consulting provider 7A - 7P or covering provider during after hours 7P -7A, for this patient?  Check the care team in [facility] and look for a) attending/consulting [facility] provider listed and b) the [facility] team listed. Page or secure chat 7A-7P. Log into [email or web address] and use [place]'s universal password to access. If you do not have the password, please contact the hospital operator. Locate the [facility] provider you are looking for under [facility] and page to a number that you can be directly reached. If you still have difficulty reaching the provider, please page the [facility] (Director on Call) for the Hospitalists listed on amion for assistance.

## 2023-03-23 NOTE — Evaluation (Signed)
 Physical Therapy Evaluation Patient Details Name: [name] MRN: [id number] DOB: 1945-12-03 Today's Date: 03/23/2023  History of Present Illness  Pt is a 78 y.o. female presenting 03/20/23 with SOB and Bil LE swelling. Admitted with chronic diastolic CHF. PMHx CAD, mid aortic stenosis, HTN, anxiety, chronic pain syndrome, GERD, hypothyroidism, HLD, ILD, CKD 3A  Clinical Impression  Pt is a 78 y.o. female presenting on 03/20/23 with above conditions and deficits below, see PT Problem List. PTA pt lived independently alone in a one level home with 5 STE. Currently pt is min-mod A for transfers and has been unable to safely walk since her admission. Pt displays deficits in global LE strength and AROM, cognition, activity tolerance, functional mobility, ambulation, and balance and would benefit from continued acute PT to address these impairments before d/c. Additionally, given the pt's current condition, living situation, and lack of available assistance, it is recommended the pt receives inpatient rehab < 3 hours upon d/c. Will continue to follow acutely.          If plan is discharge home, recommend the following: A lot of help with walking and/or transfers;A lot of help with bathing/dressing/bathroom;Assistance with cooking/housework;Direct supervision/assist for medications management;Assist for transportation;Help with stairs or ramp for entrance   Can travel by private vehicle   Yes    Equipment Recommendations BSC/3in1;Wheelchair (measurements PT)  Recommendations for Other Services       Functional Status Assessment Patient has had a recent decline in their functional status and demonstrates the ability to make significant improvements in function in a reasonable and predictable amount of time.     Precautions / Restrictions Precautions Precautions: Fall Recall of Precautions/Restrictions: Intact Precaution/Restrictions Comments: pt has resting jaw tremor Restrictions Weight  Bearing Restrictions Per Provider Order: No      Mobility  Bed Mobility               General bed mobility comments: Upon arrival, pt is in recliner and returned to recliner at EOS    Transfers Overall transfer level: Needs assistance Equipment used: Rolling walker (2 wheels) Transfers: Sit to/from Stand Sit to Stand: Min assist, Mod assist           General transfer comment: Pt performed 2 sit to stands. 1st required mod A for power up from seat, cueing and assistance to scoot hips forward and acheiving hip extension. 2nd sit to stand required min A for power up from recliner. Pt required constant cueing for sequencing and initation for both transfers    Ambulation/Gait             Pre-gait activities: Pt performs x3 marches in place with RW bil before requesting a seated break. Pt required tactile cueing for weight shift and VC for sequencing and initation throughout pre-gait. General Gait Details: deferred s/t pt fatigue levels  Stairs            Wheelchair Mobility     Tilt Bed    Modified Rankin (Stroke Patients Only)       Balance Overall balance assessment: Needs assistance Sitting-balance support: No upper extremity supported, Feet supported Sitting balance-Leahy Scale: Fair Sitting balance - Comments: pt sits with moderately increased trunk flexion and requires constant cueing to maintain trunk extension Postural control:  (anterior lean with increased cervical flexion) Standing balance support: Reliant on assistive device for balance, Bilateral upper extremity supported, Single extremity supported Standing balance-Leahy Scale: Poor Standing balance comment: Pt reliant on at least one UE to maintain  standing balance. Pt stands with moderately increased trunk and neck flexion and requires VC and TC for trunk and neck extension. Pt quickly returns to trunk and neck flexion upon removal of cueing. Pt has posterior lean preference. With static  reaching standing balance, pt is unable to reach with her R UE s/t reported reduced function & displays greatly reduced ROM when reaching with her L UE and required min A to keep posterior balance                             Pertinent Vitals/Pain Pain Assessment Pain Assessment: No/denies pain    Home Living Family/patient expects to be discharged to:: Private residence Living Arrangements: Alone Available Help at Discharge: Family;Neighbor;Available PRN/intermittently Type of Home: House Home Access: Stairs to enter   [employer] of Steps: 5 back door with R handrail, 2 front foor with bil handrails   Home Layout: One level Home Equipment: Cane - single Librarian, academic (2 wheels) Additional Comments: says she does not use O2 at home, says she has DME above but does not use    Prior Function Prior Level of Function : Independent/Modified Independent             Mobility Comments: Reports she does not use any AD for ambulation ADLs Comments: pt reports ind with ALDs/IADLs     Extremity/Trunk Assessment   Upper Extremity Assessment Upper Extremity Assessment: Defer to OT evaluation RUE Deficits / Details: Decreased AROM throughout---pt reports this is long standing RUE Coordination: decreased fine motor;decreased gross motor    Lower Extremity Assessment Lower Extremity Assessment: Generalized weakness    Cervical / Trunk Assessment Cervical / Trunk Assessment: Kyphotic  Communication   Communication Communication: Impaired Factors Affecting Communication: Hearing impaired    Cognition Arousal: Alert Behavior During Therapy: Flat affect   PT - Cognitive impairments: Sequencing, Initiation, Awareness                       PT - Cognition Comments: Pt required consistent cueing for initation and sequencing throughout the session. Following commands: Impaired Following commands impaired: Follows one step commands  inconsistently, Follows one step commands with increased time     Cueing Cueing Techniques: Verbal cues, Visual cues, Tactile cues     General Comments General comments (skin integrity, edema, etc.): Pt's HR varied throughout session ranging from the low 100s to the low 130s before returning quickly to the 110s. Pt's RR peaked at 27 bpm. Pt trialed box breathing to which RR slightly decreased    Exercises     Assessment/Plan    PT Assessment Patient needs continued PT services  PT Problem List Decreased range of motion;Decreased strength;Decreased activity tolerance;Decreased balance;Decreased mobility;Decreased cognition;Decreased coordination;Decreased knowledge of use of DME;Cardiopulmonary status limiting activity;Decreased safety awareness       PT Treatment Interventions DME instruction;Gait training;Stair training;Functional mobility training;Therapeutic activities;Therapeutic exercise;Balance training;Neuromuscular re-education;Cognitive remediation;Patient/family education;Wheelchair mobility training    PT Goals (Current goals can be found in the Care Plan section)  Acute Rehab PT Goals Patient Stated Goal: improve mobility PT Goal Formulation: With patient Time For Goal Achievement: 04/06/23 Potential to Achieve Goals: Good    Frequency Min 3X/week     Co-evaluation               AM-PAC PT "6 Clicks" Mobility  Outcome Measure Help needed turning from your back to your side while in a flat bed without  using bedrails?: Total Help needed moving from lying on your back to sitting on the side of a flat bed without using bedrails?: Total Help needed moving to and from a bed to a chair (including a wheelchair)?: A Little Help needed standing up from a chair using your arms (e.g., wheelchair or bedside chair)?: A Little Help needed to walk in hospital room?: Total Help needed climbing 3-5 steps with a railing? : Total 6 Click Score: 10    End of Session Equipment  Utilized During Treatment: Gait belt;Oxygen Activity Tolerance: Patient limited by fatigue Patient left: in chair;with call bell/phone within reach;with chair alarm set Nurse Communication: Mobility status PT Visit Diagnosis: Unsteadiness on feet (R26.81);Other abnormalities of gait and mobility (R26.89);Muscle weakness (generalized) (M62.81);Difficulty in walking, not elsewhere classified (R26.2)    Time: 1914-7829 PT Time Calculation (min) (ACUTE ONLY): 35 min   Charges:   PT Evaluation $PT Eval Moderate Complexity: 1 Mod PT Treatments $Therapeutic Activity: 8-22 mins PT General Charges $$ ACUTE PT VISIT: 1 Visit         [address], SPT   [place] 03/23/2023, 5:02 PM

## 2023-03-23 NOTE — Plan of Care (Signed)
  Problem: Clinical Measurements: Goal: Ability to maintain clinical measurements within normal limits will improve Outcome: Progressing Goal: Respiratory complications will improve Outcome: Progressing Goal: Cardiovascular complication will be avoided Outcome: Progressing   Problem: Coping: Goal: Level of anxiety will decrease Outcome: Progressing   Problem: Safety: Goal: Ability to remain free from injury will improve Outcome: Progressing   Problem: Cardiac: Goal: Ability to achieve and maintain adequate cardiopulmonary perfusion will improve Outcome: Progressing

## 2023-03-23 NOTE — Progress Notes (Addendum)
 Patient Name: [name] Date of Encounter: 03/23/2023 [facility] Cardiologist: Armanda Magic, MD   Interval Summary  .    Patient resting in the room, currently on 4 L oxygen via [place] Patient drowsy, not unchanged from yesterday, does not answer most of my questions  She denies any signs of blood in her urine or stool She just complains that she is feeling tired from being woken up so much   Vital Signs .    Vitals:   03/23/23 0505 03/23/23 0519 03/23/23 0654 03/23/23 0825  BP: 101/70   107/80  Pulse: (!) 104 (!) 113 (!) 103 (!) 113  Resp: 20 16 13 16   Temp: (!) 97.5 F (36.4 C)   97.7 F (36.5 C)  TempSrc: Oral   Oral  SpO2: 96% 95% 96% 97%  Weight:      Height:        Intake/Output Summary (Last 24 hours) at 03/23/2023 0903 Last data filed at 03/23/2023 0653 Gross per 24 hour  Intake 1439.41 ml  Output --  Net 1439.41 ml      03/23/2023    5:00 AM 03/22/2023    3:56 AM 03/20/2023    1:12 PM  Last 3 Weights  Weight (lbs) 117 lb 8.1 oz 117 lb 1 oz 110 lb  Weight (kg) 53.3 kg 53.1 kg 49.896 kg      Telemetry/ECG    Atrial fibrillation, HR 90-120s - Personally Reviewed  Physical Exam .   GEN: No acute distress, resting on 4 L oxygen via [place].   Cardiac: irregular rhythm, tachycardic, no murmurs, rubs, or gallops.  Respiratory: good air movement, mild crackles bilaterally. GI: Soft, nontender, non-distended  MS: bilateral pitting edema, improving, possible cellulitis   Assessment & Plan .     Lesieli ASHANTI RATTI is a 78 y.o. female with a hx of HFpEF, CAD s/p multiple interventions (DES to RCA in 2011 for MI, DES to RCA on 06/2010, DES to LAD in 2014), hypertension, hyperlipidemia, aortic stenosis, esophageal strictures s/p dilation, GERD, hypothyroidism, renal insufficiency, chronic respiratory failure, interstitial lung disease, DVT in 2019, pericardial cyst s/p resection who is admitted for acute on chronic HFpEF, A. Fib   Acute on chronic HFpEF Moderate  low flow, low gradient aortic stenosis  Echo this admission showed: EF 60-65%, moderate LVH, moderately reduced RV function, moderately enlarged RV, biatrial enlargement, mild MR, mild to moderate TR, low-flow, low-gradient AS (mean gradient 8 mmHg), dilated IVC  Weight today 117 lb Net - 2.3 L this admission, appears to only put out 300 cc yesterday  CXR showed significant vascular congestion -- edema vs. PNA Creatinine elevated to 1.77 today, 1.42 yesterday  Continue strict I&O's, daily weights Most of PTA GDMT has been held due to hypotension    Paroxysmal atrial fibrillation with RVR CHADS2VASc score is 7 Still currently in atrial fibrillation, HR ranging from 90-120s this AM  BNP 1,056 TSH elevated at 6.3, free T4 elevated 1.48 -- known history of hypothyroidism, currently on synthroid  Continue IV amiodarone  Continue IV heparin -- will need DOAC once hemoglobin recovers, hemoglobin decreased today to 8.6 from 9.5  Ordered hemoccult to evaluate for GI bleed, due to drop in hemoglobin   Consider inpatient TEE guided DCCV if does not spontaneously convert -- may have to hold off depending on results of hemoccult and/or if hemoglobin does not recover as patient would not be able to tolerate Eliquis    CAD s/p DES to  RCA 2011, DES to RCA 06/2010, DES to LAD 2014 Troponin 72 > 88 Continue ASA 81 mg daily Continue Crestor 40 mg daily    Hypertension Patient has had episodes of hypotension this admission, requiring midodrine  Most recent BP 107/80   Hyperlipidemia  Continue Crestor 40 mg daily    Per primary Hypothyroidism Possible cellulitis GERD Normocytic anemia Seizure disorder  Electrolyte disturbances   For questions or updates, please contact [facility] Please consult [email or web address] for contact info under     Signed, Olena Leatherwood, PA-C   Patient seen and examined, note reviewed with the signed Advanced Practice Provider. I personally reviewed  laboratory data, imaging studies and relevant notes. I independently examined the patient and formulated the important aspects of the plan. I have personally discussed the plan with the patient and/or family. Comments or changes to the note/plan are indicated below.  Acute on chronic systolic heart failure acute on chronic systolic heart failure Moderate low-flow low gradient aortic stenosis Paroxysmal atrial fibrillation CAD status post DES to the RCA 2011, DES to the RCA 2012 and DES to LAD Hypertension Hyperlipidemia  She still in atrial fibrillation rate is still ranging in the high 90s and low 100s.  Will continue to follow the patient closely unfortunately I am concerned with her hemoglobin dropping 8.6 today which has dropped from 9.5.  I am going to be conservative and not proceed with TEE cardioversion in this patient as of yet.  Lenient rate control at this time. Will keep her on the amiodarone drip.  Her blood pressure is also on the lower side cannot adding beta-blockers or calcium channel blockers at this time. In terms of her anticoagulation for stroke prevention.  Will keep her on a heparin to monitor the hemoglobin if it continues to stay steady we can transition her to Eliquis 2.5 mg twice daily. If her hemoglobin continues to drop she will benefit from being evaluated by GI but will defer to the primary team.  Thomasene Ripple DO, MS [facility] Attending Cardiologist [facility]  [address] [place], [place] [address] [phone] Website: [email or web address]

## 2023-03-23 NOTE — Evaluation (Signed)
 Occupational Therapy Evaluation Patient Details Name: [name] MRN: [id number] DOB: Jun 10, 1945 Today's Date: 03/23/2023   History of Present Illness   Pt is a 78 y.o. female presenting 03/20/23 with SOB and Bil LE swelling. Admitted with chronic diastolic CHF. PMHx CAD, mid aortic stenosis, HTN, anxiety, chronic pain syndrome, GERD, hypothyroidism, HLD, ILD, CKD 3A     Clinical Impressions This 78 yo female admitted with above presents to acute OT with PLOF per her report of being independent at home without [place] of ADs for basic ADLs and mobility. Currently she is setup-total A for basic ADLs and lives alone so she will continue to benefit from acute OT with follow up from continued inpatient follow up therapy, <3 hours/day      If plan is discharge home, recommend the following:   A little help with walking and/or transfers;A lot of help with bathing/dressing/bathroom;Assistance with cooking/housework;Assistance with feeding;Help with stairs or ramp for entrance;Assist for transportation;Direct supervision/assist for medications management;Direct supervision/assist for financial management     Functional Status Assessment   Patient has had a recent decline in their functional status and demonstrates the ability to make significant improvements in function in a reasonable and predictable amount of time.     Equipment Recommendations   None recommended by OT      Precautions/Restrictions   Precautions Precautions: Fall Restrictions Weight Bearing Restrictions Per Provider Order: No     Mobility Bed Mobility Overal bed mobility: Needs Assistance Bed Mobility: Supine to Sit     Supine to sit: Max assist     General bed mobility comments: A for legs, trunk, and scooting to EOB with use of pad    Transfers Overall transfer level: Needs assistance Equipment used: 1 person hand held assist Transfers: Sit to/from Stand, Bed to chair/wheelchair/BSC Sit to Stand:  Min assist Stand pivot transfers: Min assist                Balance Overall balance assessment: Needs assistance Sitting-balance support: No upper extremity supported, Feet supported Sitting balance-Leahy Scale: Fair     Standing balance support: Bilateral upper extremity supported Standing balance-Leahy Scale: Poor                             ADL either performed or assessed with clinical judgement   ADL Overall ADL's : Needs assistance/impaired Eating/Feeding: Minimal assistance;Sitting (in recliner)   Grooming: Set up;Supervision/safety;Sitting Grooming Details (indicate cue type and reason): in recliner Upper Body Bathing: Sitting;Minimal assistance Upper Body Bathing Details (indicate cue type and reason): in recliner Lower Body Bathing: Total assistance Lower Body Bathing Details (indicate cue type and reason): Min A sit<>stand Upper Body Dressing : Maximal assistance;Sitting Upper Body Dressing Details (indicate cue type and reason): in recliner Lower Body Dressing: Total assistance Lower Body Dressing Details (indicate cue type and reason): min A sit<>stand Toilet Transfer: Minimal assistance;Stand-pivot Toilet Transfer Details (indicate cue type and reason): simulated bed>recliner next to bed Toileting- Clothing Manipulation and Hygiene: Total assistance Toileting - Clothing Manipulation Details (indicate cue type and reason): min A sit<>stand             Vision Baseline Vision/History: 0 No visual deficits              Pertinent Vitals/Pain Pain Assessment Pain Assessment: No/denies pain     Extremity/Trunk Assessment Upper Extremity Assessment Upper Extremity Assessment: Left hand dominant;RUE deficits/detail RUE Deficits / Details: Decreased AROM throughout---pt reports  this is long standing RUE Coordination: decreased fine motor;decreased gross motor           Communication Communication Factors Affecting Communication:  Hearing impaired   Cognition Arousal: Alert Behavior During Therapy: WFL for tasks assessed/performed Cognition: Cognition impaired   Orientation impairments: Time, Situation (did not know year or why she was here) Awareness: Intellectual awareness impaired, Online awareness impaired Memory impairment (select all impairments): Declarative long-term memory Attention impairment (select first level of impairment): Sustained attention Executive functioning impairment (select all impairments): Problem solving                   Following commands: Intact       Cueing  General Comments   Cueing Techniques: Verbal cues  Pt's HR 109 at start and up to 128 during session. Pt on 4 liters of O2 with sats staying above 92% or above (but you could hear her breathing), RR 17-22           Home Living Family/patient expects to be discharged to:: Private residence Living Arrangements: Alone Available Help at Discharge: Family;Neighbor;Available PRN/intermittently Type of Home: House Home Access: Stairs to enter [employer] of Steps: 5 back door with R handrail, 2 front foor with bil handrails   Home Layout: One level     Bathroom Shower/Tub: Chief Strategy Officer: Standard     Home Equipment: Cane - single Librarian, academic (2 wheels)   Additional Comments: says she does not use O2 at home, says she has DME above but does not use      Prior Functioning/Environment Prior Level of Function : Independent/Modified Independent             Mobility Comments: Reports she does not use any AD for ambulation ADLs Comments: pt reports ind with ALDs/IADLs    OT Problem List: Decreased strength;Decreased range of motion;Impaired balance (sitting and/or standing);Decreased cognition;Decreased safety awareness   OT Treatment/Interventions: Self-care/ADL training;DME and/or AE instruction;Balance training;Patient/family education      OT Goals(Current  goals can be found in the care plan section)   Acute Rehab OT Goals Patient Stated Goal: agreeable to getting up and OOB OT Goal Formulation: With patient Time For Goal Achievement: 04/06/23 Potential to Achieve Goals: Good   OT Frequency:  Min 2X/week       AM-PAC OT "6 Clicks" Daily Activity     Outcome Measure Help from another person eating meals?: A Little Help from another person taking care of personal grooming?: A Little Help from another person toileting, which includes using toliet, bedpan, or urinal?: A Lot Help from another person bathing (including washing, rinsing, drying)?: A Lot Help from another person to put on and taking off regular upper body clothing?: A Lot Help from another person to put on and taking off regular lower body clothing?: Total 6 Click Score: 13   End of Session Equipment Utilized During Treatment: Gait belt Nurse Communication: Mobility status  Activity Tolerance: Patient tolerated treatment well Patient left: in chair;with call bell/phone within reach;with chair alarm set  OT Visit Diagnosis: Unsteadiness on feet (R26.81);Other abnormalities of gait and mobility (R26.89);Other symptoms and signs involving cognitive function;Muscle weakness (generalized) (M62.81)                Time: 9147-8295 OT Time Calculation (min): 29 min Charges:  OT General Charges $OT Visit: 1 Visit OT Evaluation $OT Eval Moderate Complexity: 1 Mod OT Treatments $Self Care/Home Management : 8-22 mins  Cathy L. OT  Acute Rehabilitation Services Office [phone]    Evette Georges 03/23/2023, 2:19 PM

## 2023-03-23 NOTE — Progress Notes (Signed)
 Orthopedic Tech Progress Note Patient Details:  [name] 04-01-1945 [id number]  Ortho Devices Type of Ortho Device: Radio broadcast assistant Ortho Device/Splint Location: bi-lateral Ortho Device/Splint Interventions: Ordered, Application, Adjustment  The Rn called requesting unna boots since the patient had none on. We explained they were charted as applied yesterday. The Rn said they didn't have any on now and they needed them. We went to check on the unna boots and the patient didn't have any on. So we applied new unna boots. Daye held for me while applied the unna boots. Post Interventions Patient Tolerated: Well Instructions Provided: Care of device, Adjustment of device  Trinna Post 03/23/2023, 8:41 PM

## 2023-03-23 NOTE — Progress Notes (Signed)
 PHARMACY - ANTICOAGULATION CONSULT NOTE  Pharmacy Consult for heparin  Indication: atrial fibrillation  No Known Allergies  Patient Measurements: Height: 5' (152.4 cm) Weight: 53.3 kg (117 lb 8.1 oz) IBW/kg (Calculated) : 45.5 Heparin Dosing Weight: 50 kg   Vital Signs: Temp: 97.7 F (36.5 C) (03/12 0825) Temp Source: Oral (03/12 0825) BP: 107/80 (03/12 0825) Pulse Rate: 113 (03/12 0825)  Labs: Recent Labs    03/20/23 1336 03/20/23 1545 03/21/23 0407 03/22/23 0013 03/22/23 0447 03/22/23 0858 03/22/23 1843 03/23/23 0405  HGB 10.6*  --  9.5*  --  9.5*  --   --  8.6*  HCT 34.1*  --  29.9*  --  29.6*  --   --  26.5*  PLT 252  --  256  --  300  --   --  269  HEPARINUNFRC  --   --   --    < >  --  0.25* 0.21* 0.54  CREATININE 1.15*  --  1.42*  --  1.77*  --   --  1.91*  TROPONINIHS 72* 88*  --   --   --   --   --   --    < > = values in this interval not displayed.    Estimated Creatinine Clearance: 18 mL/min (A) (by C-G formula based on SCr of 1.91 mg/dL (H)).   Medical History: Past Medical History:  Diagnosis Date   Anxiety    Arthritis    Blood transfusion without reported diagnosis    childhood   Carpal tunnel syndrome, bilateral 07/31/2015   Cataract    Cervical spondylosis without myelopathy    Chronic chest pain    Chronic kidney disease    Chronic low back pain    Chronotropic incompetence    CKD (chronic kidney disease), stage III (HCC)    Coronary artery disease    a. inf-post MI 2011 s/p DES to RCA. b. DES to RCA 06/2010. c. DES to LAD 2014, residual dz treated medically.   Depression    DVT (deep venous thrombosis) (HCC)    left   Esophageal stricture    GERD (gastroesophageal reflux disease)    Gross hematuria    Heart murmur    History of esophageal dilatation    FOR STRIUCTURE   History of gout    History of hiatal hernia    History of kidney stones    Hyperlipidemia    Hypertension    Hypothyroidism    Internal hemorrhoids     Irritable bowel syndrome    Mild aortic stenosis    by echo 2018   Myocardial infarction (HCC)    Obesity    Orthostatic hypotension    Partial seizure disorder (HCC) NEUROLOGIST-- DR Anne Hahn   NOCTURNAL   PONV (postoperative nausea and vomiting)    hard time getting iv site-had to do neck stick 2 yrs ago   Pulmonary fibrosis (HCC)    S/P pericardial cyst excision    02-05-2013  benign   Seizures (HCC)    none in yrs   SVT (supraventricular tachycardia) (HCC)    Trigger finger, acquired 09/30/2015   Right middle finger   Vitamin D deficiency    Weakness     Assessment: Patient admitted for CC of SOB with leg swelling. Patient with hx of SVT, now in Afib with RVR.  Currently started on amiodarone drip for rate control   Heparin drip running 1000 uts/hr in peripheral IV and heparin level 0.54 at  goal drawn from PICC line.  Hgb fell slightly - concern for GIB work up internal jugular process  Will try to keep heparin level closer to bottom of goal   Goal of Therapy:  Heparin level 0.3-0.7 units/ml Monitor platelets by anticoagulation protocol: Yes   Plan:  Decrease heparin infusion to 900 units/hr Daily heparin level and CBC Monitor s/s bleeding    Leota Sauers Pharm.D. CPP, BCPS Clinical Pharmacist [phone] 03/23/2023 10:08 AM

## 2023-03-24 DIAGNOSIS — I5033 Acute on chronic diastolic (congestive) heart failure: Secondary | ICD-10-CM | POA: Diagnosis not present

## 2023-03-24 LAB — RENAL FUNCTION PANEL
Albumin: 2.3 g/dL — ABNORMAL LOW (ref 3.5–5.0)
Anion gap: 8 (ref 5–15)
BUN: 46 mg/dL — ABNORMAL HIGH (ref 8–23)
CO2: 24 mmol/L (ref 22–32)
Calcium: 6.8 mg/dL — ABNORMAL LOW (ref 8.9–10.3)
Chloride: 105 mmol/L (ref 98–111)
Creatinine, Ser: 1.59 mg/dL — ABNORMAL HIGH (ref 0.44–1.00)
GFR, Estimated: 33 mL/min — ABNORMAL LOW (ref >60)
Glucose, Bld: 165 mg/dL — ABNORMAL HIGH (ref 70–99)
Phosphorus: 3.9 mg/dL (ref 2.5–4.6)
Potassium: 3.4 mmol/L — ABNORMAL LOW (ref 3.5–5.1)
Sodium: 137 mmol/L (ref 135–145)

## 2023-03-24 LAB — CBC WITH DIFFERENTIAL/PLATELET
Abs Immature Granulocytes: 0.05 10*3/uL (ref 0.00–0.07)
Basophils Absolute: 0 10*3/uL (ref 0.0–0.1)
Basophils Relative: 0 %
Eosinophils Absolute: 0.3 10*3/uL (ref 0.0–0.5)
Eosinophils Relative: 3 %
HCT: 28.2 % — ABNORMAL LOW (ref 36.0–46.0)
Hemoglobin: 9.1 g/dL — ABNORMAL LOW (ref 12.0–15.0)
Immature Granulocytes: 1 %
Lymphocytes Relative: 10 %
Lymphs Abs: 1 10*3/uL (ref 0.7–4.0)
MCH: 29.8 pg (ref 26.0–34.0)
MCHC: 32.3 g/dL (ref 30.0–36.0)
MCV: 92.5 fL (ref 80.0–100.0)
Monocytes Absolute: 1.1 10*3/uL — ABNORMAL HIGH (ref 0.1–1.0)
Monocytes Relative: 11 %
Neutro Abs: 7.5 10*3/uL (ref 1.7–7.7)
Neutrophils Relative %: 75 %
Platelets: 294 10*3/uL (ref 150–400)
RBC: 3.05 MIL/uL — ABNORMAL LOW (ref 3.87–5.11)
RDW: 14.1 % (ref 11.5–15.5)
WBC: 9.9 10*3/uL (ref 4.0–10.5)
nRBC: 0 % (ref 0.0–0.2)

## 2023-03-24 LAB — OCCULT BLOOD X 1 CARD TO LAB, STOOL: Fecal Occult Bld: NEGATIVE

## 2023-03-24 LAB — MAGNESIUM: Magnesium: 2.1 mg/dL (ref 1.7–2.4)

## 2023-03-24 LAB — HEPARIN LEVEL (UNFRACTIONATED): Heparin Unfractionated: 0.46 [IU]/mL (ref 0.30–0.70)

## 2023-03-24 MED ORDER — POTASSIUM CHLORIDE CRYS ER 20 MEQ PO TBCR
40.0000 meq | EXTENDED_RELEASE_TABLET | ORAL | Status: AC
  Administered 2023-03-24 (×2): 40 meq via ORAL
  Filled 2023-03-24 (×2): qty 2

## 2023-03-24 MED ORDER — ENSURE ENLIVE PO LIQD
237.0000 mL | Freq: Two times a day (BID) | ORAL | Status: DC
  Administered 2023-03-24 – 2023-03-29 (×9): 237 mL via ORAL

## 2023-03-24 MED ORDER — LOPERAMIDE HCL 2 MG PO CAPS
2.0000 mg | ORAL_CAPSULE | ORAL | Status: DC | PRN
  Administered 2023-03-24: 2 mg via ORAL
  Filled 2023-03-24: qty 1

## 2023-03-24 MED ORDER — ORAL CARE MOUTH RINSE: 15.0000 mL | OROMUCOSAL | Status: DC | PRN

## 2023-03-24 MED ORDER — SORBITOL 70 % SOLN
30.0000 mL | Freq: Once | Status: AC
  Administered 2023-03-24: 30 mL via ORAL
  Filled 2023-03-24: qty 30

## 2023-03-24 MED ORDER — POLYETHYLENE GLYCOL 3350 17 G PO PACK
17.0000 g | PACK | Freq: Every day | ORAL | Status: DC
  Administered 2023-03-24 – 2023-03-29 (×6): 17 g via ORAL
  Filled 2023-03-24 (×6): qty 1

## 2023-03-24 NOTE — TOC Initial Note (Signed)
 Transition of Care [facility]) - Initial/Assessment Note    Patient Details  Name: [name] MRN: [id number] Date of Birth: [birth year]  [facility]) CM/SW Contact:    Delilah Shan, LCSWA Phone Number: 03/24/2023, 2:22 PM  Clinical Narrative:                  CSW received consult for possible SNF placement at time of discharge. CSW spoke with patient at bedside regarding PT recommendation of SNF placement at time of discharge.  Patient expressed understanding of PT recommendation and request for CSW to call her niece to help assist with her dc plan. CSW called patients niece [name] who informed CSW that patient comes from home alone. Patients niece expressed understanding of PT recommendations and is agreeable to SNF placement for patient at time of discharge. CSW discussed insurance authorization process.  No further questions reported at this time. CSW to continue to follow and assist with discharge planning needs.    Expected Discharge Plan: Skilled Nursing Facility Barriers to Discharge: Continued Medical Work up   Patient Goals and CMS Choice Patient states their goals for this hospitalization and ongoing recovery are:: SNF   Choice offered to / list presented to :  (niece)      Expected Discharge Plan and Services In-house Referral: Clinical Social Work Discharge Planning Services: CM Consult   Living arrangements for the past 2 months: Single Family Home                                      Prior Living Arrangements/Services Living arrangements for the past 2 months: Single Family Home Lives with:: Self Patient language and need for interpreter reviewed:: Yes Do you feel safe going back to the place where you live?: No   SNF  Need for Family Participation in Patient Care: Yes (Comment) Care giver support system in place?: Yes (comment) Current home services: DME (per patient's niece, [name], patient uses shower bench, walker and  cane.) Criminal Activity/Legal Involvement Pertinent to Current Situation/Hospitalization: No - Comment as needed  Activities of Daily Living   ADL Screening (condition at time of admission) Independently performs ADLs?: Yes (appropriate for developmental age) Is the patient deaf or have difficulty hearing?: No Does the patient have difficulty seeing, even when wearing glasses/contacts?: No Does the patient have difficulty concentrating, remembering, or making decisions?: No  Permission Sought/Granted Permission sought to share information with : Case Manager, Family Supports, Oceanographer granted to share information with : Yes, Verbal Permission Granted  Share Information with NAME: [name]  Permission granted to share info w AGENCY: SNF  Permission granted to share info w Relationship: niece  Permission granted to share info w Contact Information: [name] [phone]  Emotional Assessment Appearance:: Appears stated age Attitude/Demeanor/Rapport: Gracious Affect (typically observed): Calm Orientation: : Oriented to Self, Oriented to Place, Oriented to  Time, Oriented to Situation Alcohol / Substance Use: Not Applicable Psych Involvement: No (comment)  Admission diagnosis:  Acute on chronic diastolic (congestive) heart failure (HCC) [I50.33] Acute on chronic congestive heart failure, unspecified heart failure type [facility]) [I50.9] Patient Active Problem List   Diagnosis Date Noted   Acute on chronic diastolic (congestive) heart failure (HCC) 03/20/2023   Chronic kidney disease, stage 3a (HCC) 03/20/2023   UTI (urinary tract infection) 11/18/2022   NSTEMI (non-ST elevated myocardial infarction) (HCC) 09/16/2021   Elevated troponin 09/15/2021  Stage 3b chronic kidney disease (CKD) (HCC) 09/15/2021   Partial seizure disorder (HCC)    DVT (deep venous thrombosis) (HCC) 08/19/2017   Orthostatic hypotension 08/18/2017   Diarrhea 08/18/2017   Mild  aortic stenosis    Long-term current use of opiate analgesic 04/06/2017   Chronic low back pain 04/06/2017   Chronic neck pain 04/06/2017   Chronic pain syndrome 04/06/2017   Hyperphosphatemia 11/08/2016   Trigger finger, acquired 09/30/2015   Carpal tunnel syndrome, bilateral 07/31/2015   Abnormal CAT scan 05/04/2015   Breathlessness on exertion 05/04/2015   Abnormal chest CT 05/04/2015   Anemia in chronic kidney disease (CODE) 04/29/2015   Bone disease, metabolic 04/29/2015   Microscopic hematuria 04/29/2015   Avitaminosis D 04/29/2015   Normocytic anemia 03/01/2015   Syncope due to orthostatic hypotension 03/01/2015   Dehydration 03/01/2015   Hypomagnesemia 03/01/2015   Generalized weakness 02/23/2015   Hypokalemia 02/23/2015   AKI (acute kidney injury) (HCC) 02/23/2015   Acute bronchitis 02/23/2015   Hypocalcemia 02/23/2015   ILD (interstitial lung disease) (HCC) 01/24/2015   Chronic respiratory failure (HCC) 12/26/2014   Pulmonary fibrosis (HCC) 12/26/2014   Dyspnea and respiratory abnormality 11/22/2014   Abdominal pain 10/15/2014   Pain in the chest    RUQ abdominal pain    Shortness of breath 10/14/2014   Odynophagia    History of esophageal stricture    Esophageal dysmotility    Renal insufficiency 02/14/2014   Acute renal failure (HCC) 02/14/2014   Nephrolithiasis 02/13/2014   Gross hematuria 02/13/2014   Convulsions/seizures (HCC) 07/26/2013   Pericardial cyst    Mediastinal mass 02/05/2013   Cervical spondylosis 11/17/2011   Chest pain 11/16/2011   Hypertension    Hyperlipidemia    Hypothyroidism    History of  seizures    Depression    Carpal tunnel syndrome    Paroxysmal SVT (supraventricular tachycardia) (HCC) 01/02/2011   CAD in native artery    ANEMIA-UNSPECIFIED 12/12/2009   History of drug coated stent 12/01/2009   GERD 01/19/2008   IBS 01/19/2008   PCP:  Merri Brunette, MD Pharmacy:   [id number] [place], [place] - [address] [place] [address] Phone: [phone] Fax: [phone]  [facility] [address] [place] [address] Phone: [phone] Fax: [phone]     Social Drivers of Health (SDOH) Social History: SDOH Screenings   Food Insecurity: No Food Insecurity (03/21/2023)  Housing: Low Risk  (03/21/2023)  Transportation Needs: No Transportation Needs (03/21/2023)  Utilities: Not At Risk (03/21/2023)  Social Connections: Socially Isolated (03/21/2023)  Tobacco Use: Medium Risk (03/22/2023)   SDOH Interventions:     Readmission Risk Interventions    03/22/2023    1:22 PM 11/26/2022   12:03 PM 11/19/2022   11:49 AM  Readmission Risk Prevention Plan  Transportation Screening Complete Complete Complete  PCP or Specialist Appt within 5-7 Days  Complete Complete  PCP or Specialist Appt within 3-5 Days Complete    Home Care Screening  Complete Complete  Medication Review (RN CM)  Complete Complete  HRI or Home Care Consult Complete    Medication Review (RN Care Manager) Complete

## 2023-03-24 NOTE — NC FL2 (Signed)
 [place] MEDICAID FL2 LEVEL OF CARE FORM     IDENTIFICATION  Patient Name: [name] Birthdate: 1945-07-09 Sex: female Admission Date (Current Location): 03/20/2023  [facility] and [place] Number:  Producer, television/film/video and Address:  [facility], [address] [place], [place] [address]      Provider Number: [id number]  Attending Physician Name and Address:  Hughie Closs, MD  Relative Name and Phone Number:  [name] (niece) [phone]    Current Level of Care: Hospital Recommended Level of Care: Skilled Nursing Facility Prior Approval Number:    Date Approved/Denied:   PASRR Number: [id number]  Discharge Plan: SNF    Current Diagnoses: Patient Active Problem List   Diagnosis Date Noted   Acute on chronic diastolic (congestive) heart failure (HCC) 03/20/2023   Chronic kidney disease, stage 3a (HCC) 03/20/2023   UTI (urinary tract infection) 11/18/2022   NSTEMI (non-ST elevated myocardial infarction) (HCC) 09/16/2021   Elevated troponin 09/15/2021   Stage 3b chronic kidney disease (CKD) (HCC) 09/15/2021   Partial seizure disorder (HCC)    DVT (deep venous thrombosis) (HCC) 08/19/2017   Orthostatic hypotension 08/18/2017   Diarrhea 08/18/2017   Mild aortic stenosis    Long-term current use of opiate analgesic 04/06/2017   Chronic low back pain 04/06/2017   Chronic neck pain 04/06/2017   Chronic pain syndrome 04/06/2017   Hyperphosphatemia 11/08/2016   Trigger finger, acquired 09/30/2015   Carpal tunnel syndrome, bilateral 07/31/2015   Abnormal CAT scan 05/04/2015   Breathlessness on exertion 05/04/2015   Abnormal chest CT 05/04/2015   Anemia in chronic kidney disease (CODE) 04/29/2015   Bone disease, metabolic 04/29/2015   Microscopic hematuria 04/29/2015   Avitaminosis D 04/29/2015   Normocytic anemia 03/01/2015   Syncope due to orthostatic hypotension 03/01/2015   Dehydration 03/01/2015   Hypomagnesemia 03/01/2015    Generalized weakness 02/23/2015   Hypokalemia 02/23/2015   AKI (acute kidney injury) (HCC) 02/23/2015   Acute bronchitis 02/23/2015   Hypocalcemia 02/23/2015   ILD (interstitial lung disease) (HCC) 01/24/2015   Chronic respiratory failure (HCC) 12/26/2014   Pulmonary fibrosis (HCC) 12/26/2014   Dyspnea and respiratory abnormality 11/22/2014   Abdominal pain 10/15/2014   Pain in the chest    RUQ abdominal pain    Shortness of breath 10/14/2014   Odynophagia    History of esophageal stricture    Esophageal dysmotility    Renal insufficiency 02/14/2014   Acute renal failure (HCC) 02/14/2014   Nephrolithiasis 02/13/2014   Gross hematuria 02/13/2014   Convulsions/seizures (HCC) 07/26/2013   Pericardial cyst    Mediastinal mass 02/05/2013   Cervical spondylosis 11/17/2011   Chest pain 11/16/2011   Hypertension    Hyperlipidemia    Hypothyroidism    History of  seizures    Depression    Carpal tunnel syndrome    Paroxysmal SVT (supraventricular tachycardia) (HCC) 01/02/2011   CAD in native artery    ANEMIA-UNSPECIFIED 12/12/2009   History of drug coated stent 12/01/2009   GERD 01/19/2008   IBS 01/19/2008    Orientation RESPIRATION BLADDER Height & Weight     Self, Time, Situation, Place  O2 (Nasal Cannula 4 liters) Incontinent (Urethral Catheter) Weight: 114 lb 13.8 oz (52.1 kg) Height:  5' (152.4 cm)  BEHAVIORAL SYMPTOMS/MOOD NEUROLOGICAL BOWEL NUTRITION STATUS      Continent Diet (Please see discharge summary)  AMBULATORY STATUS COMMUNICATION OF NEEDS Skin   Extensive Assist Verbally Other (Comment) (Abrasion,Leg, Bil.,Lower,Ecchymosis,Arm,Bil.,Erythema,Leg,Bil.,Scratch marks,Leg,Bil.,Lower,Wound/Incision LDAs)  Personal Care Assistance Level of Assistance  Bathing, Feeding, Dressing Bathing Assistance: Maximum assistance Feeding assistance: Limited assistance Dressing Assistance: Maximum assistance     Functional Limitations Info  Sight,  Hearing, Speech   Hearing Info: Impaired Speech Info: Adequate    SPECIAL CARE FACTORS FREQUENCY  PT (By licensed PT), OT (By licensed OT)     PT Frequency: 5x min weekly OT Frequency: 5x min weekly            Contractures Contractures Info: Not present    Additional Factors Info  Code Status, Allergies, Psychotropic Code Status Info: DNR Allergies Info: NKA Psychotropic Info: levETIRAcetam (KEPPRA) tablet 500 mg 2 times daily         Current Medications (03/24/2023):  This is the current hospital active medication list Current Facility-Administered Medications  Medication Dose Route Frequency Provider Last Rate Last Admin   acetaminophen (TYLENOL) tablet 650 mg  650 mg Oral Q6H PRN Rolly Salter, MD       Or   acetaminophen (TYLENOL) suppository 650 mg  650 mg Rectal Q6H PRN Rolly Salter, MD       ALPRAZolam Prudy Feeler) tablet 0.25 mg  0.25 mg Oral BID PRN Rolly Salter, MD   0.25 mg at 03/23/23 2234   amiodarone (NEXTERONE PREMIX) 360-4.14 MG/200ML-% (1.8 mg/mL) IV infusion  30 mg/hr Intravenous Continuous Rolly Salter, MD 16.67 mL/hr at 03/24/23 1122 30 mg/hr at 03/24/23 1122   Chlorhexidine Gluconate Cloth 2 % PADS 6 each  6 each Topical Daily Rolly Salter, MD   6 each at 03/24/23 1214   feeding supplement (ENSURE ENLIVE / ENSURE PLUS) liquid 237 mL  237 mL Oral BID BM Pahwani, Ravi, MD   237 mL at 03/24/23 1439   heparin ADULT infusion 100 units/mL (25000 units/227mL)  900 Units/hr Intravenous Continuous Hughie Closs, MD 9 mL/hr at 03/23/23 1921 900 Units/hr at 03/23/23 1921   HYDROcodone-acetaminophen (NORCO/VICODIN) 5-325 MG per tablet 1 tablet  1 tablet Oral Q6H PRN Rolly Salter, MD       levETIRAcetam (KEPPRA) tablet 500 mg  500 mg Oral BID Hughie Closs, MD   500 mg at 03/24/23 1032   levothyroxine (SYNTHROID) tablet 75 mcg  75 mcg Oral Q0600 Hughie Closs, MD   75 mcg at 03/24/23 0536   metoprolol tartrate (LOPRESSOR) injection 5 mg  5 mg Intravenous  Q4H PRN Rolly Salter, MD       morphine (PF) 2 MG/ML injection 1 mg  1 mg Intravenous Q2H PRN Rolly Salter, MD   1 mg at 03/21/23 0838   ondansetron (ZOFRAN) tablet 4 mg  4 mg Oral Q6H PRN Rolly Salter, MD       Or   ondansetron [facility]) injection 4 mg  4 mg Intravenous Q6H PRN Rolly Salter, MD       Oral care mouth rinse  15 mL Mouth Rinse PRN Hughie Closs, MD       pantoprazole (PROTONIX) EC tablet 40 mg  40 mg Oral BID Rolly Salter, MD   40 mg at 03/24/23 1030   polyethylene glycol (MIRALAX / GLYCOLAX) packet 17 g  17 g Oral Daily Pahwani, Daleen Bo, MD   17 g at 03/24/23 1030   rosuvastatin (CRESTOR) tablet 40 mg  40 mg Oral Daily Rolly Salter, MD   40 mg at 03/24/23 1030   senna-docusate (Senokot-S) tablet 1 tablet  1 tablet Oral BID Rolly Salter, MD  1 tablet at 03/24/23 1030   sodium chloride flush (NS) 0.9 % injection 10-40 mL  10-40 mL Intracatheter Q12H Rolly Salter, MD   10 mL at 03/24/23 1032   sodium chloride flush (NS) 0.9 % injection 10-40 mL  10-40 mL Intracatheter PRN Rolly Salter, MD         Discharge Medications: Please see discharge summary for a list of discharge medications.  Relevant Imaging Results:  Relevant Lab Results:   Additional Information [id number]  Delilah Shan, LCSWA

## 2023-03-24 NOTE — Plan of Care (Signed)
  Problem: Clinical Measurements: Goal: Respiratory complications will improve Outcome: Progressing Goal: Cardiovascular complication will be avoided Outcome: Progressing   Problem: Coping: Goal: Level of anxiety will decrease Outcome: Progressing   Problem: Elimination: Goal: Will not experience complications related to urinary retention Outcome: Progressing   Problem: Safety: Goal: Ability to remain free from injury will improve Outcome: Progressing   Problem: Cardiac: Goal: Ability to achieve and maintain adequate cardiopulmonary perfusion will improve Outcome: Progressing   Problem: Clinical Measurements: Goal: Ability to avoid or minimize complications of infection will improve Outcome: Progressing

## 2023-03-24 NOTE — Progress Notes (Signed)
 Mobility Specialist Progress Note;   03/24/23 1145  Mobility  Activity Dangled on edge of bed  Level of Assistance Moderate assist, patient does 50-74%  Assistive Device None  Range of Motion/Exercises Active Assistive;Right arm;Left arm;Right leg;Left leg  Activity Response Tolerated fair  Mobility Referral Yes  Mobility visit 1 Mobility  Mobility Specialist Start Time (ACUTE ONLY) 1145  Mobility Specialist Stop Time (ACUTE ONLY) 1155  Mobility Specialist Time Calculation (min) (ACUTE ONLY) 10 min   Pt agreeable to mobility. Required ModA for bed mobility to assist pt to EOB, declined transferring to the chair. Pt able to tolerate sitting on EOB for a few minutes and participate in bed level mobility exercises. HR increased to 128 w/ activity. Pt returned comfortably back to bed with all needs met. RN in room.   Caesar Bookman Mobility Specialist Please contact via SecureChat or [employer] [phone]

## 2023-03-24 NOTE — Progress Notes (Addendum)
 Patient Name: [name] Date of Encounter: 03/24/2023 [facility] Cardiologist: Armanda Magic, MD   Interval Summary  .    Patient awake and alert this morning, still on 4 L oxygen via [place] Nurse was also in the room when I went to see the patient -- she stated that the patient has not been eating much and that is likely contributing to her fatigue They have not gotten the hemoccult yet as the patient has not had a BM but there has continued to be no overt blood noted in urine  Vital Signs .    Vitals:   03/23/23 2347 03/24/23 0301 03/24/23 0500 03/24/23 0657  BP: 98/71 91/63    Pulse: 100 (!) 103    Resp: 14 14  12   Temp: 97.9 F (36.6 C) 97.6 F (36.4 C)    TempSrc: Oral Oral    SpO2:  100%    Weight:   52.1 kg   Height:       Intake/Output Summary (Last 24 hours) at 03/24/2023 0850 Last data filed at 03/23/2023 2030 Gross per 24 hour  Intake 210 ml  Output 550 ml  Net -340 ml      03/24/2023    5:00 AM 03/23/2023    5:00 AM 03/22/2023    3:56 AM  Last 3 Weights  Weight (lbs) 114 lb 13.8 oz 117 lb 8.1 oz 117 lb 1 oz  Weight (kg) 52.1 kg 53.3 kg 53.1 kg     Telemetry/ECG    Atrial fibrillation with HR ranging 100-120s - Personally Reviewed  Physical Exam .   GEN: No acute distress, awake and alert, tremulous, on 4 L oxygen via [place] Neck: No JVD Cardiac: irregular rhythm, tachycardic, no murmurs, rubs, or gallops.  Respiratory: good air movement, mild crackles bilaterally. GI: Soft, nontender, non-distended  MS: bilateral edema, improving, wrapped in bandages    Assessment & Plan .    [name] is a 78 y.o. female with a hx of HFpEF, CAD s/p multiple interventions (DES to RCA in 2011 for MI, DES to RCA on 06/2010, DES to LAD in 2014), hypertension, hyperlipidemia, aortic stenosis, esophageal strictures s/p dilation, GERD, hypothyroidism, renal insufficiency, chronic respiratory failure, interstitial lung disease, DVT in 2019, pericardial cyst s/p  resection who is admitted for acute on chronic HFpEF, A. Fib   Acute on chronic HFpEF Moderate low flow, low gradient aortic stenosis  Echo this admission showed: EF 60-65%, moderate LVH, moderately reduced RV function, moderately enlarged RV, biatrial enlargement, mild MR, mild to moderate TR, low-flow, low-gradient AS (mean gradient 8 mmHg), dilated IVC  Weight today 114 lb Urine output not likely being accurately charted  CXR showed significant vascular congestion -- edema vs. PNA Creatinine elevated to 1.59 today, 1.91 yesterday  Continue strict I&O's, daily weights Most of PTA GDMT has been held due to hypotension    Paroxysmal atrial fibrillation with RVR CHADS2VASc score is 7 Still currently in atrial fibrillation, HR ranging from 100-120s this AM  BNP 1,056 TSH elevated at 6.3, free T4 elevated 1.48 -- known history of hypothyroidism, currently on synthroid  Continue IV amiodarone for now  Continue IV heparin and consider switching to Eliquis 2.5 mg BID once we have confirmed there is no GI bleed -- hemoglobin slightly recovered today to 9.1, up from 8.6 but still overall down from 12 in November will likely be a reasonable choice after we can rule out potential GI bleed with hemoccult  Ordered  hemoccult to evaluate for GI bleed, due to drop in hemoglobin -- still waiting for results as patient has not had a BM yet    CAD s/p DES to RCA 2011, DES to RCA 06/2010, DES to LAD 2014 Hyperlipidemia  Continue Crestor 40 mg daily  Will stop ASA to reduce bleeding risk    Hypertension Patient has had episodes of hypotension this admission, requiring midodrine  Most recent BP 91/63   Per primary Hypothyroidism Possible cellulitis GERD Normocytic anemia Seizure disorder  Electrolyte disturbances  For questions or updates, please contact [facility] Please consult [email or web address] for contact info under  Signed, Olena Leatherwood, PA-C   Patient seen and examined, note  reviewed with the signed Advanced Practice Provider. I personally reviewed laboratory data, imaging studies and relevant notes. I independently examined the patient and formulated the important aspects of the plan. I have personally discussed the plan with the patient and/or family. Comments or changes to the note/plan are indicated below.  Patient was seen and examined at her bedside.  Currently on IV amiodarone, will plan to convert to po tomorrow.  If hemoglobin remains stable by tomorrow will transition Eliquis  Blood pressure is marginal,no medication changes.  No anginal symptoms with stop Jonne Ply, continue crestor    Thomasene Ripple DO, MS [facility] Attending Cardiologist [facility]  [address] [place], [place] [address] [phone] Website: [email or web address]

## 2023-03-24 NOTE — Progress Notes (Signed)
 PHARMACY - ANTICOAGULATION CONSULT NOTE  Pharmacy Consult for heparin  Indication: atrial fibrillation  No Known Allergies  Patient Measurements: Height: 5' (152.4 cm) Weight: 52.1 kg (114 lb 13.8 oz) IBW/kg (Calculated) : 45.5 Heparin Dosing Weight: 50 kg   Vital Signs: Temp: 97.6 F (36.4 C) (03/13 0301) Temp Source: Oral (03/13 0301) BP: 91/63 (03/13 0301) Pulse Rate: 103 (03/13 0301)  Labs: Recent Labs    03/22/23 0447 03/22/23 0858 03/22/23 1843 03/23/23 0405 03/23/23 1700 03/24/23 0545  HGB 9.5*  --   --  8.6* 9.3* 9.1*  HCT 29.6*  --   --  26.5* 28.7* 28.2*  PLT 300  --   --  269 313 294  HEPARINUNFRC  --    < > 0.21* 0.54  --  0.46  CREATININE 1.77*  --   --  1.91*  --  1.59*   < > = values in this interval not displayed.    Estimated Creatinine Clearance: 21.6 mL/min (A) (by C-G formula based on SCr of 1.59 mg/dL (H)).   Medical History: Past Medical History:  Diagnosis Date   Anxiety    Arthritis    Blood transfusion without reported diagnosis    childhood   Carpal tunnel syndrome, bilateral 07/31/2015   Cataract    Cervical spondylosis without myelopathy    Chronic chest pain    Chronic kidney disease    Chronic low back pain    Chronotropic incompetence    CKD (chronic kidney disease), stage III (HCC)    Coronary artery disease    a. inf-post MI 2011 s/p DES to RCA. b. DES to RCA 06/2010. c. DES to LAD 2014, residual dz treated medically.   Depression    DVT (deep venous thrombosis) (HCC)    left   Esophageal stricture    GERD (gastroesophageal reflux disease)    Gross hematuria    Heart murmur    History of esophageal dilatation    FOR STRIUCTURE   History of gout    History of hiatal hernia    History of kidney stones    Hyperlipidemia    Hypertension    Hypothyroidism    Internal hemorrhoids    Irritable bowel syndrome    Mild aortic stenosis    by echo 2018   Myocardial infarction (HCC)    Obesity    Orthostatic hypotension     Partial seizure disorder (HCC) NEUROLOGIST-- DR Anne Hahn   NOCTURNAL   PONV (postoperative nausea and vomiting)    hard time getting iv site-had to do neck stick 2 yrs ago   Pulmonary fibrosis (HCC)    S/P pericardial cyst excision    02-05-2013  benign   Seizures (HCC)    none in yrs   SVT (supraventricular tachycardia) (HCC)    Trigger finger, acquired 09/30/2015   Right middle finger   Vitamin D deficiency    Weakness     Assessment: Patient admitted for CC of SOB with leg swelling. Patient with hx of SVT, now in Afib with RVR.  Currently started on amiodarone drip for rate control   Heparin drip running 900 uts/hr in peripheral IV and heparin level 0.44 at goal drawn from PICC line.  Hgb fell slightly - concern for GIB work up in process  Will try to keep heparin level closer to bottom of goal    Goal of Therapy:  Heparin level 0.3-0.7 units/ml Monitor platelets by anticoagulation protocol: Yes   Plan:  Continue heparin infusion  900 units/hr Daily heparin level and CBC Monitor s/s bleeding    Leota Sauers Pharm.D. CPP, BCPS Clinical Pharmacist [phone] 03/24/2023 9:10 AM

## 2023-03-24 NOTE — Progress Notes (Addendum)
 PROGRESS NOTE    [name]  ZOX:[id number] DOB: 1945-06-11 DOA: 03/20/2023 PCP: Jasmine Brunette, MD   Brief Narrative:  Patient with PMH of CAD with PCI to RCA, recent cath 9/23 with nonobstructive CAD and patent stents, mild aortic stenosis, echo EF 6065% in 2023, HTN, anxiety, chronic pain syndrome, GERD, hypothyroidism, HLD, ILD, CKD 3A presented to the hospital with complaints of leg swelling and shortness of breath. Went to see PCP in 2/27 there was some trace edema noted but no significant shortness of breath.   niece found the patient with very short of breath and therefore patient was brought to the hospital. Currently being treated for acute on chronic HFpEF.  Assessment & Plan:   Principal Problem:   Acute on chronic diastolic (congestive) heart failure (HCC) Active Problems:   Hypertension   Hyperlipidemia   Hypothyroidism   History of  seizures   Depression   CAD in native artery   GERD   IBS   ILD (interstitial lung disease) (HCC)   Mild aortic stenosis   Chronic pain syndrome   Chronic kidney disease, stage 3a (HCC)  Acute hypoxic respiratory failure secondary to acute on chronic diastolic CHF: Upon admission, BNP significantly elevated 1056.  Significant swelling in the leg.  Tripoding requiring BiPAP therapy and NRB in the ER. Chest x-ray shows significant vascular congestion.  There is comment about pneumonia although pneumonia is less likely. Echocardiogram 60 to 65% in September 2023. Last cath 9/23 40% stenosis and RPDA and second diagonal, otherwise nonobstructive CAD with patent stent in RCA. Continue BiPAP as needed.  Cardiology following and managing.  She is not on any diuretics due to elevated creatinine.  AKI on CKD stage IIIb: Baseline creatinine appears to be around 1.3-1.5, peaked at 1.91 but improved to 1.6 today.  No diuretics and no nephrotoxic agents.  Repeat labs in the morning.  Hypokalemia: Will replenish.  Acute anemia: Almost 3 months  ago, patient's hemoglobin was normal.  She presents with 10.6 which gradually dropped to 8.6 yesterday but improved to 9.3 today without any transfusion.  No reports of hematemesis, melena or hematochezia.  Cardiology concerned about GI bleed and checking FOBT which was ordered yesterday but is still pending because patient has not had any bowel movement.  GI consultation not warranted unless we have evidence of positive GI bleed/FOBT.   History of SVT but here with paroxysmal atrial fibrillation with RVR. Patient has history of SVT and is on Cardizem PTA. Cardizem was on hold on admission to allow room for diuresis. Found to have A-fib with RVR early in the morning of 3/10.  Cardiology managing, currently on amiodarone and heparin drip.   Acute urinary retention. Was unable to urinate on her own with bladder scan showing 400+ cc.  Placed Foley catheter.  Continue for now.   Possible cellulitis. Patient was presumed to have cellulitis of bilateral legs based on appearance.  She was started on cefazolin.  On my examination, I did not see any erythema, no warmth or tenderness.  Looks like antibiotics were started on 03/20/2023, will continue for total of 5 to 7 days.   History of CAD and mild aortic stenosis/elevated troponin. Last 2023.  Nonobstructive CAD with patent stent.Troponins minimally elevated but EKG does not show any evidence of acute ischemia.  Echo shows preserved EF.  No significant wall abnormality.  Likely demand ischemia.  Hypertension: Blood pressure on the low side.  All antihypertensives on hold.   Acute metabolic encephalopathy.  Likely in the setting of hypotension. Per previous hospitalist documentation  "blood pressure corrected with midodrine with improvement in mentation".  However I did not see any midodrine ordered.  Patient is alert and oriented, needs some prompts though.   Poor venous access. RN requested a PICC line.  Placed.    GERD. Continue PPI.    Hypothyroidism. Continuing Synthroid.  Both TSH and free T4 elevated.  I reduced Synthroid from 88 mcg to 75 mcg starting 03/24/2023.  Repeat TSH in 6 to 8 weeks.   History of seizures. Continuing Keppra.  Switching to oral.   Depression. On Cymbalta, will resume tomorrow.   HLD. Continuing statin.   Goals of care conversation. Discussed with the patient as well as in presence of niece. Patient would like to continue DNR.    Prolonged QTc. Minimize QT prolonging medications.  At risk of further worsening in the setting of amiodarone use.  Monitor and EKG and telemetry.   DVT prophylaxis: Heparin   Code Status: Do not attempt resuscitation (DNR) PRE-ARREST INTERVENTIONS DESIRED  Family Communication:  None present at bedside.  Plan of care discussed with patient in length and he/she verbalized understanding and agreed with it.  Also I called patient's niece [name] and updated her as well.  She was appreciative of the call.  Status is: Inpatient Remains inpatient appropriate because: Needs inpatient management.   Estimated body mass index is 22.43 kg/m as calculated from the following:   Height as of this encounter: 5' (1.524 m).   Weight as of this encounter: 52.1 kg.    Nutritional Assessment: Body mass index is 22.43 kg/m.Marland Kitchen Seen by dietician.  I agree with the assessment and plan as outlined below: Nutrition Status:        . Skin Assessment: I have examined the patient's skin and I agree with the wound assessment as performed by the wound care RN as outlined below:    Consultants:  Cardiology  Procedures:  None  Antimicrobials:  Anti-infectives (From admission, onward)    Start     Dose/Rate Route Frequency Ordered Stop   03/20/23 2200  ceFAZolin (ANCEF) IVPB 1 g/50 mL premix        1 g 100 mL/hr over 30 Minutes Intravenous Every 12 hours 03/20/23 1754 03/23/23 2210         Subjective: Patient seen and examined.  No complaints  today. Objective: Vitals:   03/23/23 2347 03/24/23 0301 03/24/23 0500 03/24/23 0657  BP: 98/71 91/63    Pulse: 100 (!) 103    Resp: 14 14  12   Temp: 97.9 F (36.6 C) 97.6 F (36.4 C)    TempSrc: Oral Oral    SpO2:  100%    Weight:   52.1 kg   Height:        Intake/Output Summary (Last 24 hours) at 03/24/2023 0820 Last data filed at 03/23/2023 2030 Gross per 24 hour  Intake 210 ml  Output 550 ml  Net -340 ml   Filed Weights   03/22/23 0356 03/23/23 0500 03/24/23 0500  Weight: 53.1 kg 53.3 kg 52.1 kg    Examination:  General exam: Appears calm and comfortable  Respiratory system: Clear to auscultation. Respiratory effort normal. Cardiovascular system: S1 & S2 heard, irregularly irregular RR. No JVD, murmurs, rubs, gallops or clicks. No pedal edema. Gastrointestinal system: Abdomen is nondistended, soft and nontender. No organomegaly or masses felt. Normal bowel sounds heard. Central nervous system: Alert and oriented. No focal neurological deficits. Extremities:  Symmetric 5 x 5 power. Skin: No rashes, lesions or ulcers.   Data Reviewed: I have personally reviewed following labs and imaging studies  CBC: Recent Labs  Lab 03/21/23 0407 03/22/23 0447 03/23/23 0405 03/23/23 1700 03/24/23 0545  WBC 16.4* 14.5* 10.5 10.7* 9.9  NEUTROABS  --   --   --  8.7* 7.5  HGB 9.5* 9.5* 8.6* 9.3* 9.1*  HCT 29.9* 29.6* 26.5* 28.7* 28.2*  MCV 92.9 92.5 91.7 91.7 92.5  PLT 256 300 269 313 294   Basic Metabolic Panel: Recent Labs  Lab 03/20/23 1336 03/21/23 0407 03/21/23 0830 03/22/23 0447 03/23/23 0405 03/24/23 0545  NA 140 139  --  136 138 137  K 4.3 3.3*  --  4.1 3.3* 3.4*  CL 111 106  --  106 104 105  CO2 15* 20*  --  19* 21* 24  GLUCOSE 108* 94  --  105* 161* 165*  BUN 42* 42*  --  54* 56* 46*  CREATININE 1.15* 1.42*  --  1.77* 1.91* 1.59*  CALCIUM 7.4* 6.8*  --  6.0* 6.4* 6.8*  MG  --   --  1.0* 2.5* 2.3 2.1  PHOS  --   --   --  8.6* 5.7* 3.9   GFR: Estimated  Creatinine Clearance: 21.6 mL/min (A) (by C-G formula based on SCr of 1.59 mg/dL (H)). Liver Function Tests: Recent Labs  Lab 03/21/23 0407 03/22/23 0447 03/23/23 0405 03/24/23 0545  AST 40  --   --   --   ALT 26  --   --   --   ALKPHOS 72  --   --   --   BILITOT 1.5*  --   --   --   PROT 5.7*  --   --   --   ALBUMIN 2.7* 2.7* 2.4* 2.3*   No results for input(s): "LIPASE", "AMYLASE" in the last 168 hours. No results for input(s): "AMMONIA" in the last 168 hours. Coagulation Profile: No results for input(s): "INR", "PROTIME" in the last 168 hours. Cardiac Enzymes: No results for input(s): "CKTOTAL", "CKMB", "CKMBINDEX", "TROPONINI" in the last 168 hours. BNP (last 3 results) No results for input(s): "PROBNP" in the last 8760 hours. HbA1C: No results for input(s): "HGBA1C" in the last 72 hours. CBG: Recent Labs  Lab 03/21/23 2241  GLUCAP 115*   Lipid Profile: No results for input(s): "CHOL", "HDL", "LDLCALC", "TRIG", "CHOLHDL", "LDLDIRECT" in the last 72 hours. Thyroid Function Tests: No results for input(s): "TSH", "T4TOTAL", "FREET4", "T3FREE", "THYROIDAB" in the last 72 hours.  Anemia Panel: No results for input(s): "VITAMINB12", "FOLATE", "FERRITIN", "TIBC", "IRON", "RETICCTPCT" in the last 72 hours. Sepsis Labs: Recent Labs  Lab 03/21/23 0830  PROCALCITON 0.72    Recent Results (from the past 240 hours)  Resp panel by RT-PCR (RSV, Flu A&B, Covid) Anterior Nasal Swab     Status: None   Collection Time: 03/20/23  2:00 PM   Specimen: Anterior Nasal Swab  Result Value Ref Range Status   SARS Coronavirus 2 by RT PCR NEGATIVE NEGATIVE Final   Influenza A by PCR NEGATIVE NEGATIVE Final   Influenza B by PCR NEGATIVE NEGATIVE Final    Comment: (NOTE) The Xpert Xpress SARS-CoV-2/FLU/RSV plus assay is intended as an aid in the diagnosis of influenza from Nasopharyngeal swab specimens and should not be used as a sole basis for treatment. Nasal washings and aspirates  are unacceptable for Xpert Xpress SARS-CoV-2/FLU/RSV testing.  Fact Sheet for Patients: BloggerCourse.com  Fact  Sheet for Healthcare Providers: SeriousBroker.it  This test is not yet approved or cleared by the [place] and has been authorized for detection and/or diagnosis of SARS-CoV-2 by FDA under an Emergency Use Authorization (EUA). This EUA will remain in effect (meaning this test can be used) for the duration of the COVID-19 declaration under Section 564(b)(1) of the Act, 21 U.S.C. section 360bbb-3(b)(1), unless the authorization is terminated or revoked.     Resp Syncytial Virus by PCR NEGATIVE NEGATIVE Final    Comment: (NOTE) Fact Sheet for Patients: BloggerCourse.com  Fact Sheet for Healthcare Providers: SeriousBroker.it  This test is not yet approved or cleared by the [place] FDA and has been authorized for detection and/or diagnosis of SARS-CoV-2 by FDA under an Emergency Use Authorization (EUA). This EUA will remain in effect (meaning this test can be used) for the duration of the COVID-19 declaration under Section 564(b)(1) of the Act, 21 U.S.C. section 360bbb-3(b)(1), unless the authorization is terminated or revoked.  Performed at [facility], [address], [place], [place] [address]      Radiology Studies: DG CHEST PORT 1 VIEW Result Date: 03/22/2023 CLINICAL DATA:  Central line EXAM: PORTABLE CHEST 1 VIEW COMPARISON:  Chest x-ray 03/20/2023 FINDINGS: Right upper extremity PICC terminates in the distal SVC. There is no pneumothorax. Sternotomy wires are present. The cardiomediastinal silhouette is stable, the heart is mildly enlarged. Bilateral multifocal airspace disease has not significantly changed. No pleural effusion or pneumothorax. No acute fractures are seen. IMPRESSION: 1. Right upper extremity PICC terminates in the distal SVC.  No pneumothorax. 2. Stable bilateral multifocal airspace disease. Electronically Signed   By: Darliss Cheney M.D.   On: 03/22/2023 18:54   [place] EKG SITE RITE Result Date: 03/22/2023 If Site Rite image not attached, placement could not be confirmed due to current cardiac rhythm.   Scheduled Meds:  aspirin EC  81 mg Oral Daily   Chlorhexidine Gluconate Cloth  6 each Topical Daily   feeding supplement  237 mL Oral BID BM   levETIRAcetam  500 mg Oral BID   levothyroxine  75 mcg Oral Q0600   pantoprazole  40 mg Oral BID   potassium chloride  40 mEq Oral Q4H   rosuvastatin  40 mg Oral Daily   senna-docusate  1 tablet Oral BID   sodium chloride flush  10-40 mL Intracatheter Q12H   Continuous Infusions:  amiodarone 30 mg/hr (03/24/23 0248)   heparin 900 Units/hr (03/23/23 1921)     LOS: 4 days   Hughie Closs, MD [facility]  03/24/2023, 8:20 AM   *Please note that this is a verbal dictation therefore any spelling or grammatical errors are due to the "Dragon Medical One" system interpretation.  Please page via Amion and do not message via secure chat for urgent patient care matters. Secure chat can be used for non urgent patient care matters.  How to contact the [facility] Attending or Consulting provider 7A - 7P or covering provider during after hours 7P -7A, for this patient?  Check the care team in [facility] and look for a) attending/consulting TRH provider listed and b) the [facility] team listed. Page or secure chat 7A-7P. Log into [email or web address] and use [place]'s universal password to access. If you do not have the password, please contact the hospital operator. Locate the [facility] provider you are looking for under [facility] and page to a number that you can be directly reached. If you still have difficulty reaching the  provider, please page the [facility] (Director on Call) for the Hospitalists listed on amion for assistance.

## 2023-03-25 ENCOUNTER — Inpatient Hospital Stay (HOSPITAL_COMMUNITY)

## 2023-03-25 DIAGNOSIS — R918 Other nonspecific abnormal finding of lung field: Secondary | ICD-10-CM | POA: Diagnosis not present

## 2023-03-25 DIAGNOSIS — I5033 Acute on chronic diastolic (congestive) heart failure: Secondary | ICD-10-CM | POA: Diagnosis not present

## 2023-03-25 LAB — HEPARIN LEVEL (UNFRACTIONATED): Heparin Unfractionated: 0.31 [IU]/mL (ref 0.30–0.70)

## 2023-03-25 LAB — IRON AND TIBC
Iron: 14 ug/dL — ABNORMAL LOW (ref 28–170)
Saturation Ratios: 7 % — ABNORMAL LOW (ref 10.4–31.8)
TIBC: 213 ug/dL — ABNORMAL LOW (ref 250–450)
UIBC: 199 ug/dL

## 2023-03-25 LAB — CBC WITH DIFFERENTIAL/PLATELET
Abs Immature Granulocytes: 0.06 10*3/uL (ref 0.00–0.07)
Basophils Absolute: 0 10*3/uL (ref 0.0–0.1)
Basophils Relative: 0 %
Eosinophils Absolute: 0.2 10*3/uL (ref 0.0–0.5)
Eosinophils Relative: 2 %
HCT: 29.3 % — ABNORMAL LOW (ref 36.0–46.0)
Hemoglobin: 9 g/dL — ABNORMAL LOW (ref 12.0–15.0)
Immature Granulocytes: 1 %
Lymphocytes Relative: 10 %
Lymphs Abs: 1 10*3/uL (ref 0.7–4.0)
MCH: 28.9 pg (ref 26.0–34.0)
MCHC: 30.7 g/dL (ref 30.0–36.0)
MCV: 94.2 fL (ref 80.0–100.0)
Monocytes Absolute: 1.1 10*3/uL — ABNORMAL HIGH (ref 0.1–1.0)
Monocytes Relative: 11 %
Neutro Abs: 7.7 10*3/uL (ref 1.7–7.7)
Neutrophils Relative %: 76 %
Platelets: 302 10*3/uL (ref 150–400)
RBC: 3.11 MIL/uL — ABNORMAL LOW (ref 3.87–5.11)
RDW: 14.2 % (ref 11.5–15.5)
WBC: 10 10*3/uL (ref 4.0–10.5)
nRBC: 0 % (ref 0.0–0.2)

## 2023-03-25 LAB — BASIC METABOLIC PANEL
Anion gap: 8 (ref 5–15)
BUN: 37 mg/dL — ABNORMAL HIGH (ref 8–23)
CO2: 21 mmol/L — ABNORMAL LOW (ref 22–32)
Calcium: 7 mg/dL — ABNORMAL LOW (ref 8.9–10.3)
Chloride: 108 mmol/L (ref 98–111)
Creatinine, Ser: 1.67 mg/dL — ABNORMAL HIGH (ref 0.44–1.00)
GFR, Estimated: 32 mL/min — ABNORMAL LOW (ref >60)
Glucose, Bld: 162 mg/dL — ABNORMAL HIGH (ref 70–99)
Potassium: 4.4 mmol/L (ref 3.5–5.1)
Sodium: 137 mmol/L (ref 135–145)

## 2023-03-25 LAB — FOLATE: Folate: 12 ng/mL (ref >5.9)

## 2023-03-25 LAB — PROCALCITONIN: Procalcitonin: 0.33 ng/mL

## 2023-03-25 LAB — RETICULOCYTES
Immature Retic Fract: 18.9 % — ABNORMAL HIGH (ref 2.3–15.9)
RBC.: 3.25 MIL/uL — ABNORMAL LOW (ref 3.87–5.11)
Retic Count, Absolute: 84.8 10*3/uL (ref 19.0–186.0)
Retic Ct Pct: 2.6 % (ref 0.4–3.1)

## 2023-03-25 LAB — VITAMIN B12: Vitamin B-12: 417 pg/mL (ref 180–914)

## 2023-03-25 LAB — BRAIN NATRIURETIC PEPTIDE: B Natriuretic Peptide: 1201.7 pg/mL — ABNORMAL HIGH (ref 0.0–100.0)

## 2023-03-25 LAB — FERRITIN: Ferritin: 302 ng/mL (ref 11–307)

## 2023-03-25 MED ORDER — FUROSEMIDE 10 MG/ML IJ SOLN
40.0000 mg | Freq: Once | INTRAMUSCULAR | Status: AC
  Administered 2023-03-25: 40 mg via INTRAVENOUS
  Filled 2023-03-25: qty 4

## 2023-03-25 MED ORDER — MIDODRINE HCL 5 MG PO TABS
5.0000 mg | ORAL_TABLET | Freq: Once | ORAL | Status: AC
  Administered 2023-03-25: 5 mg via ORAL
  Filled 2023-03-25: qty 1

## 2023-03-25 MED ORDER — APIXABAN 2.5 MG PO TABS
2.5000 mg | ORAL_TABLET | Freq: Two times a day (BID) | ORAL | Status: DC
  Administered 2023-03-25 – 2023-04-01 (×15): 2.5 mg via ORAL
  Filled 2023-03-25 (×15): qty 1

## 2023-03-25 MED ORDER — ATORVASTATIN CALCIUM 80 MG PO TABS
80.0000 mg | ORAL_TABLET | Freq: Every day | ORAL | Status: DC
  Administered 2023-03-25 – 2023-03-29 (×5): 80 mg via ORAL
  Filled 2023-03-25 (×5): qty 1

## 2023-03-25 NOTE — TOC Progression Note (Signed)
 Transition of Care [facility]) - Progression Note    Patient Details  Name: [name] MRN: [id number] Date of Birth: [birth year]  [facility] of [facility]) CM/SW Contact  Delilah Shan, LCSWA Phone Number: 03/25/2023, 4:59 PM  Clinical Narrative:     CSW provided SNF bed offers to patients niece [name]. [name] accepted SNF bed offer with [facility]. CSW LVM with Crystal with [facility]. CSW awaiting call back to confirm SNF bed for patient and request facility to start insurance authorization for patient. CSW will continue to follow.  Expected Discharge Plan: Skilled Nursing Facility Barriers to Discharge: Continued Medical Work up  Expected Discharge Plan and Services In-house Referral: Clinical Social Work Discharge Planning Services: CM Consult   Living arrangements for the past 2 months: Single Family Home                                       Social Determinants of Health (SDOH) Interventions SDOH Screenings   Food Insecurity: No Food Insecurity (03/21/2023)  Housing: Low Risk  (03/21/2023)  Transportation Needs: No Transportation Needs (03/21/2023)  Utilities: Not At Risk (03/21/2023)  Social Connections: Socially Isolated (03/21/2023)  Tobacco Use: Medium Risk (03/22/2023)    Readmission Risk Interventions    03/22/2023    1:22 PM 11/26/2022   12:03 PM 11/19/2022   11:49 AM  Readmission Risk Prevention Plan  Transportation Screening Complete Complete Complete  PCP or Specialist Appt within 5-7 Days  Complete Complete  PCP or Specialist Appt within 3-5 Days Complete    Home Care Screening  Complete Complete  Medication Review (RN CM)  Complete Complete  HRI or Home Care Consult Complete    Medication Review (RN Care Manager) Complete

## 2023-03-25 NOTE — Progress Notes (Signed)
 PROGRESS NOTE    [name]  NWG:[id number] DOB: 1945-05-10 DOA: 03/20/2023 PCP: Merri Brunette, MD   Brief Narrative:  Patient with PMH of CAD with PCI to RCA, recent cath 9/23 with nonobstructive CAD and patent stents, mild aortic stenosis, echo EF 6065% in 2023, HTN, anxiety, chronic pain syndrome, GERD, hypothyroidism, HLD, ILD, CKD 3A presented to the hospital with complaints of leg swelling and shortness of breath. Went to see PCP in 2/27 there was some trace edema noted but no significant shortness of breath.   niece found the patient with very short of breath and therefore patient was brought to the hospital. Currently being treated for acute on chronic HFpEF.  Assessment & Plan:   Principal Problem:   Acute on chronic diastolic (congestive) heart failure (HCC) Active Problems:   Hypertension   Hyperlipidemia   Hypothyroidism   History of  seizures   Depression   CAD in native artery   GERD   IBS   ILD (interstitial lung disease) (HCC)   Mild aortic stenosis   Chronic pain syndrome   Chronic kidney disease, stage 3a (HCC)  Acute hypoxic respiratory failure secondary to acute on chronic diastolic CHF: Upon admission, BNP significantly elevated 1056.  Significant swelling in the leg.  Tripoding requiring BiPAP therapy and NRB in the ER. Chest x-ray shows significant vascular congestion.  There is comment about pneumonia although pneumonia is less likely. Echocardiogram 60 to 65% in September 2023. Last cath 9/23 40% stenosis and RPDA and second diagonal, otherwise nonobstructive CAD with patent stent in RCA. Continue BiPAP as needed.  Cardiology following and managing.  She is not on any diuretics due to elevated creatinine.  Is still requiring 4 L of oxygen.  Cardiology plans to get CT chest.  AKI on CKD stage IIIb: Baseline creatinine appears to be around 1.3-1.5, peaked at 1.91 but improved to 1.6 today.  No diuretics and no nephrotoxic agents.  Repeat labs in the  morning.  Acute anemia: Almost 3 months ago, patient's hemoglobin was normal.  She presents with 10.6 which gradually dropped to 8.6 but improved to 9.3 without any transfusion.  No reports of hematemesis, melena or hematochezia.  Cardiology concerned about GI bleed and ordered FOBT which was negative.    History of SVT but here with paroxysmal atrial fibrillation with RVR. Patient has history of SVT and is on Cardizem PTA. Cardizem was on hold on admission to allow room for diuresis. Found to have A-fib with RVR early in the morning of 3/10.  Cardiology managing, currently on amiodarone and heparin drip.   Acute urinary retention. Was unable to urinate on her own with bladder scan showing 400+ cc.  Placed Foley catheter.  Will discontinue catheter today and do voiding trials.   Possible cellulitis. Patient was presumed to have cellulitis of bilateral legs based on appearance.  She was started on cefazolin.  On my examination, I did not see any erythema, no warmth or tenderness.  Looks like antibiotics were started on 03/20/2023, will continue for total of 5 to 7 days.   History of CAD and mild aortic stenosis/elevated troponin. Last 2023.  Nonobstructive CAD with patent stent.Troponins minimally elevated but EKG does not show any evidence of acute ischemia.  Echo shows preserved EF.  No significant wall abnormality.  Likely demand ischemia.  Hypertension: Blood pressure on the low side.  All antihypertensives on hold.   Acute metabolic encephalopathy. Likely in the setting of hypotension. Per previous hospitalist documentation  "  blood pressure corrected with midodrine with improvement in mentation".  However I did not see any midodrine ordered.  Patient is alert and oriented, needs some prompts though.   Poor venous access. RN requested a PICC line.  Placed.    GERD. Continue PPI.   Hypothyroidism. Continuing Synthroid.  Both TSH and free T4 elevated.  I reduced Synthroid from 88 mcg to  75 mcg starting 03/24/2023.  Repeat TSH in 6 to 8 weeks.   History of seizures. Continuing Keppra.  Switching to oral.   Depression. On Cymbalta, will resume tomorrow.   HLD. Continuing statin.   Goals of care conversation. Discussed with the patient as well as in presence of niece. Patient would like to continue DNR.    Prolonged QTc. Minimize QT prolonging medications.  At risk of further worsening in the setting of amiodarone use.  Monitor and EKG and telemetry.   DVT prophylaxis: Heparin   Code Status: Do not attempt resuscitation (DNR) PRE-ARREST INTERVENTIONS DESIRED  Family Communication:  None present at bedside.  Plan of care discussed with patient in length  Status is: Inpatient Remains inpatient appropriate because: Needs inpatient management.   Estimated body mass index is 22.43 kg/m as calculated from the following:   Height as of this encounter: 5' (1.524 m).   Weight as of this encounter: 52.1 kg.    Nutritional Assessment: Body mass index is 22.43 kg/m.Marland Kitchen Seen by dietician.  I agree with the assessment and plan as outlined below: Nutrition Status:        . Skin Assessment: I have examined the patient's skin and I agree with the wound assessment as performed by the wound care RN as outlined below:    Consultants:  Cardiology  Procedures:  None  Antimicrobials:  Anti-infectives (From admission, onward)    Start     Dose/Rate Route Frequency Ordered Stop   03/20/23 2200  ceFAZolin (ANCEF) IVPB 1 g/50 mL premix        1 g 100 mL/hr over 30 Minutes Intravenous Every 12 hours 03/20/23 1754 03/23/23 2210         Subjective: Patient seen and examined.  No complaints.  Objective: a Vitals:   03/24/23 1932 03/24/23 2050 03/25/23 0027 03/25/23 0302  BP: 113/79  120/87 103/76  Pulse: (!) 120 (!) 112 (!) 124 (!) 114  Resp: 11 20 19 12   Temp: 98.2 F (36.8 C)  97.9 F (36.6 C) 97.8 F (36.6 C)  TempSrc: Oral  Oral Oral  SpO2: 96% 94% 92%  96%  Weight:    52.1 kg  Height:        Intake/Output Summary (Last 24 hours) at 03/25/2023 0818 Last data filed at 03/25/2023 0400 Gross per 24 hour  Intake 237 ml  Output 850 ml  Net -613 ml   Filed Weights   03/23/23 0500 03/24/23 0500 03/25/23 0302  Weight: 53.3 kg 52.1 kg 52.1 kg    Examination:  General exam: Appears calm and comfortable  Respiratory system: Rales bilaterally, diminished breath sounds at the right base.  Respiratory effort normal. Cardiovascular system: S1 & S2 heard, irregularly irregular RR. No JVD, murmurs, rubs, gallops or clicks. No pedal edema. Gastrointestinal system: Abdomen is nondistended, soft and nontender. No organomegaly or masses felt. Normal bowel sounds heard. Central nervous system: Alert and oriented. No focal neurological deficits. Extremities: Symmetric 5 x 5 power. Skin: No rashes, lesions or ulcers.    Data Reviewed: I have personally reviewed following labs and imaging studies  CBC: Recent Labs  Lab 03/22/23 0447 03/23/23 0405 03/23/23 1700 03/24/23 0545 03/25/23 0500  WBC 14.5* 10.5 10.7* 9.9 10.0  NEUTROABS  --   --  8.7* 7.5 7.7  HGB 9.5* 8.6* 9.3* 9.1* 9.0*  HCT 29.6* 26.5* 28.7* 28.2* 29.3*  MCV 92.5 91.7 91.7 92.5 94.2  PLT 300 269 313 294 302   Basic Metabolic Panel: Recent Labs  Lab 03/21/23 0407 03/21/23 0830 03/22/23 0447 03/23/23 0405 03/24/23 0545 03/25/23 0500  NA 139  --  136 138 137 137  K 3.3*  --  4.1 3.3* 3.4* 4.4  CL 106  --  106 104 105 108  CO2 20*  --  19* 21* 24 21*  GLUCOSE 94  --  105* 161* 165* 162*  BUN 42*  --  54* 56* 46* 37*  CREATININE 1.42*  --  1.77* 1.91* 1.59* 1.67*  CALCIUM 6.8*  --  6.0* 6.4* 6.8* 7.0*  MG  --  1.0* 2.5* 2.3 2.1  --   PHOS  --   --  8.6* 5.7* 3.9  --    GFR: Estimated Creatinine Clearance: 20.6 mL/min (A) (by C-G formula based on SCr of 1.67 mg/dL (H)). Liver Function Tests: Recent Labs  Lab 03/21/23 0407 03/22/23 0447 03/23/23 0405 03/24/23 0545   AST 40  --   --   --   ALT 26  --   --   --   ALKPHOS 72  --   --   --   BILITOT 1.5*  --   --   --   PROT 5.7*  --   --   --   ALBUMIN 2.7* 2.7* 2.4* 2.3*   No results for input(s): "LIPASE", "AMYLASE" in the last 168 hours. No results for input(s): "AMMONIA" in the last 168 hours. Coagulation Profile: No results for input(s): "INR", "PROTIME" in the last 168 hours. Cardiac Enzymes: No results for input(s): "CKTOTAL", "CKMB", "CKMBINDEX", "TROPONINI" in the last 168 hours. BNP (last 3 results) No results for input(s): "PROBNP" in the last 8760 hours. HbA1C: No results for input(s): "HGBA1C" in the last 72 hours. CBG: Recent Labs  Lab 03/21/23 2241  GLUCAP 115*   Lipid Profile: No results for input(s): "CHOL", "HDL", "LDLCALC", "TRIG", "CHOLHDL", "LDLDIRECT" in the last 72 hours. Thyroid Function Tests: No results for input(s): "TSH", "T4TOTAL", "FREET4", "T3FREE", "THYROIDAB" in the last 72 hours.  Anemia Panel: No results for input(s): "VITAMINB12", "FOLATE", "FERRITIN", "TIBC", "IRON", "RETICCTPCT" in the last 72 hours. Sepsis Labs: Recent Labs  Lab 03/21/23 0830  PROCALCITON 0.72    Recent Results (from the past 240 hours)  Resp panel by RT-PCR (RSV, Flu A&B, Covid) Anterior Nasal Swab     Status: None   Collection Time: 03/20/23  2:00 PM   Specimen: Anterior Nasal Swab  Result Value Ref Range Status   SARS Coronavirus 2 by RT PCR NEGATIVE NEGATIVE Final   Influenza A by PCR NEGATIVE NEGATIVE Final   Influenza B by PCR NEGATIVE NEGATIVE Final    Comment: (NOTE) The Xpert Xpress SARS-CoV-2/FLU/RSV plus assay is intended as an aid in the diagnosis of influenza from Nasopharyngeal swab specimens and should not be used as a sole basis for treatment. Nasal washings and aspirates are unacceptable for Xpert Xpress SARS-CoV-2/FLU/RSV testing.  Fact Sheet for Patients: BloggerCourse.com  Fact Sheet for Healthcare  Providers: SeriousBroker.it  This test is not yet approved or cleared by the [place] FDA and has been authorized for detection and/or diagnosis  of SARS-CoV-2 by FDA under an Emergency Use Authorization (EUA). This EUA will remain in effect (meaning this test can be used) for the duration of the COVID-19 declaration under Section 564(b)(1) of the Act, 21 U.S.C. section 360bbb-3(b)(1), unless the authorization is terminated or revoked.     Resp Syncytial Virus by PCR NEGATIVE NEGATIVE Final    Comment: (NOTE) Fact Sheet for Patients: BloggerCourse.com  Fact Sheet for Healthcare Providers: SeriousBroker.it  This test is not yet approved or cleared by the [place] FDA and has been authorized for detection and/or diagnosis of SARS-CoV-2 by FDA under an Emergency Use Authorization (EUA). This EUA will remain in effect (meaning this test can be used) for the duration of the COVID-19 declaration under Section 564(b)(1) of the Act, 21 U.S.C. section 360bbb-3(b)(1), unless the authorization is terminated or revoked.  Performed at [facility], [address], [place], [place] [address]      Radiology Studies: No results found.   Scheduled Meds:  Chlorhexidine Gluconate Cloth  6 each Topical Daily   feeding supplement  237 mL Oral BID BM   levETIRAcetam  500 mg Oral BID   levothyroxine  75 mcg Oral Q0600   pantoprazole  40 mg Oral BID   polyethylene glycol  17 g Oral Daily   rosuvastatin  40 mg Oral Daily   senna-docusate  1 tablet Oral BID   sodium chloride flush  10-40 mL Intracatheter Q12H   Continuous Infusions:  amiodarone 30 mg/hr (03/24/23 2256)   heparin 900 Units/hr (03/24/23 2258)     LOS: 5 days   Hughie Closs, MD [facility]  03/25/2023, 8:18 AM   *Please note that this is a verbal dictation therefore any spelling or grammatical errors are due to the "Dragon  Medical One" system interpretation.  Please page via Amion and do not message via secure chat for urgent patient care matters. Secure chat can be used for non urgent patient care matters.  How to contact the [facility] Attending or Consulting provider 7A - 7P or covering provider during after hours 7P -7A, for this patient?  Check the care team in [facility] and look for a) attending/consulting TRH provider listed and b) the [facility] team listed. Page or secure chat 7A-7P. Log into [email or web address] and use [facility] universal password to access. If you do not have the password, please contact the hospital operator. Locate the [facility] provider you are looking for under [facility] and page to a number that you can be directly reached. If you still have difficulty reaching the provider, please page the [facility] (Director on Call) for the Hospitalists listed on amion for assistance.

## 2023-03-25 NOTE — Progress Notes (Signed)
 PHARMACY - ANTICOAGULATION CONSULT NOTE  Pharmacy Consult for heparin  Indication: atrial fibrillation  No Known Allergies  Patient Measurements: Height: 5' (152.4 cm) Weight: 52.1 kg (114 lb 13.8 oz) IBW/kg (Calculated) : 45.5 Heparin Dosing Weight: 50 kg   Vital Signs: Temp: 97.8 F (36.6 C) (03/14 0302) Temp Source: Oral (03/14 0302) BP: 103/76 (03/14 0302) Pulse Rate: 114 (03/14 0302)  Labs: Recent Labs    03/23/23 0405 03/23/23 1700 03/24/23 0545 03/25/23 0500  HGB 8.6* 9.3* 9.1* 9.0*  HCT 26.5* 28.7* 28.2* 29.3*  PLT 269 313 294 302  HEPARINUNFRC 0.54  --  0.46 0.31  CREATININE 1.91*  --  1.59* 1.67*    Estimated Creatinine Clearance: 20.6 mL/min (A) (by C-G formula based on SCr of 1.67 mg/dL (H)).   Medical History: Past Medical History:  Diagnosis Date   Anxiety    Arthritis    Blood transfusion without reported diagnosis    childhood   Carpal tunnel syndrome, bilateral 07/31/2015   Cataract    Cervical spondylosis without myelopathy    Chronic chest pain    Chronic kidney disease    Chronic low back pain    Chronotropic incompetence    CKD (chronic kidney disease), stage III (HCC)    Coronary artery disease    a. inf-post MI 2011 s/p DES to RCA. b. DES to RCA 06/2010. c. DES to LAD 2014, residual dz treated medically.   Depression    DVT (deep venous thrombosis) (HCC)    left   Esophageal stricture    GERD (gastroesophageal reflux disease)    Gross hematuria    Heart murmur    History of esophageal dilatation    FOR STRIUCTURE   History of gout    History of hiatal hernia    History of kidney stones    Hyperlipidemia    Hypertension    Hypothyroidism    Internal hemorrhoids    Irritable bowel syndrome    Mild aortic stenosis    by echo 2018   Myocardial infarction (HCC)    Obesity    Orthostatic hypotension    Partial seizure disorder (HCC) NEUROLOGIST-- DR Anne Hahn   NOCTURNAL   PONV (postoperative nausea and vomiting)    hard time  getting iv site-had to do neck stick 2 yrs ago   Pulmonary fibrosis (HCC)    S/P pericardial cyst excision    02-05-2013  benign   Seizures (HCC)    none in yrs   SVT (supraventricular tachycardia) (HCC)    Trigger finger, acquired 09/30/2015   Right middle finger   Vitamin D deficiency    Weakness     Assessment: Patient admitted for CC of SOB with leg swelling. Patient with hx of SVT, now in Afib with RVR.  Currently started on amiodarone drip for rate control   Heparin drip running 900 uts/hr in peripheral IV and heparin level 0.3 at goal drawn from PICC line.  Hgb fell slightly - concern for GIB - now cbc stable, occult negative  Change heparin to apixaban  Cr > 1.5, wt , 60kg age < 80yo  Goal of Therapy:  Heparin level 0.3-0.7 units/ml Monitor platelets by anticoagulation protocol: Yes   Plan:  Stop heparin drip  Start apixaban 2.5mg  BID Monitor s/s bleeding    Leota Sauers Pharm.D. CPP, BCPS Clinical Pharmacist [phone] 03/25/2023 9:41 AM

## 2023-03-25 NOTE — Progress Notes (Signed)
 Physical Therapy Treatment Patient Details Name: [name] MRN: [id number] DOB: 1945/03/15 Today's Date: 03/25/2023   History of Present Illness Pt is a 78 y.o. female presenting 03/20/23 with SOB and Bil LE swelling. Admitted with chronic diastolic CHF. PMHx CAD, mid aortic stenosis, HTN, anxiety, chronic pain syndrome, GERD, hypothyroidism, HLD, ILD, CKD 3A    PT Comments  Session focused on theract to promote functional mobility with transfers and gait training to promote safe ambulation.  Pt required much assistance/cueing for weightshifting, knee flexion, LE advancement, and AD management during pre-gait activities and required a rest break secondary to fatigue. When transferring to her recliner showed improvement of foot clearance, LE advancement, and some weightshifting but quickly fatigued and needed to sit. Pt displayed sit to stand transfer improvement by completing each with less assistance but still required additional time and cueing to achieve hip, trunk, and neck extension. Pt continues to display deficits in activity tolerance, global strength, LE AROM, functional mobility, gait, and balance and would benefit from follow up PT at a short term rehab facility < 3 hours upon d/c. Will continue to follow acutely.  Pt's BP Supine: 107/82 (91) EOB: 132/88 (103) End in recliner: 139/99 (110)     If plan is discharge home, recommend the following: A lot of help with walking and/or transfers;A lot of help with bathing/dressing/bathroom;Assistance with cooking/housework;Direct supervision/assist for medications management;Assist for transportation;Help with stairs or ramp for entrance;Direct supervision/assist for financial management   Can travel by private vehicle     Yes  Equipment Recommendations  BSC/3in1;Wheelchair (measurements PT)    Recommendations for Other Services       Precautions / Restrictions Precautions Precautions: Fall Recall of Precautions/Restrictions:  Intact Precaution/Restrictions Comments: Watch HR.  pt has resting jaw tremor Restrictions Weight Bearing Restrictions Per Provider Order: No     Mobility  Bed Mobility Overal bed mobility: Needs Assistance Bed Mobility: Supine to Sit     Supine to sit: Max assist, HOB elevated, Used rails, Min assist, Mod assist     General bed mobility comments: Pt is max A for scooting hips to the EOB. Min - mod A for final LE management, cueing throughout, and trunk elevation    Transfers Overall transfer level: Needs assistance Equipment used: Rolling walker (2 wheels)   Sit to Stand: Contact guard assist, Min assist   Step pivot transfers: Contact guard assist, Min assist       General transfer comment: Pt is min A for her first sit to stand from the lowest bed height and CGA for safety with her second. Requires cueing for initiation and sequencing throughout, hand placement on RW, and achieving upright trunk posture upon standing    Ambulation/Gait Ambulation/Gait assistance: Contact guard assist Gait Distance (Feet): 2 Feet Assistive device: Rolling walker (2 wheels) Gait Pattern/deviations: Step-through pattern, Decreased stride length, Shuffle, Trunk flexed, Narrow base of support Gait velocity: reduced Gait velocity interpretation: <1.31 ft/sec, indicative of household ambulator Pre-gait activities: Pt requires tactile, verbal, and gestural cueing to march in place. Pt provide max A for with lateral weight shifting to both side, L>R, and R knee flexion. General Gait Details: Pt able to walk around 2 ft to recliner and pivot to sit. Further distance withheld s/t pt fatigue levels. Takes step with greatly increased trunk and cervical flexion   Stairs             Wheelchair Mobility     Tilt Bed    Modified Rankin (Stroke Patients  Only)       Balance Overall balance assessment: Needs assistance Sitting-balance support: No upper extremity supported, Feet  supported Sitting balance-Leahy Scale: Fair Sitting balance - Comments: pt sits with moderately increased trunk flexion and requires constant cueing to maintain trunk extension but can sit EOB and in recliner without LOB     Standing balance-Leahy Scale: Poor Standing balance comment: Pt reliant on at least one UE to maintain standing balance. Pt stands with maximally increased trunk and neck flexion and requires VC and TC for trunk and neck extension. Severe neck and trunk flexion may stem from the severe thoracic kyphosis the pt has and is able to fully extend her neck without compensation. Pt quickly returns to trunk and neck flexion upon removal of cueing. Pt has posterior lean preference.                            Communication Communication Communication: Impaired Factors Affecting Communication: Hearing impaired;Difficulty expressing self  Cognition Arousal: Alert Behavior During Therapy: Flat affect   PT - Cognitive impairments: Sequencing, Initiation, Awareness                       PT - Cognition Comments: Pt requires consistent cueing for initation and sequencing throughout the session. Benefitted from a mix of internal and external cueing Following commands: Impaired Following commands impaired: Follows one step commands inconsistently, Follows one step commands with increased time    Cueing Cueing Techniques: Verbal cues, Visual cues, Tactile cues, Gestural cues  Exercises General Exercises - Lower Extremity Short Arc Quad: AROM, Strengthening, Both, 10 reps, Supine    General Comments General comments (skin integrity, edema, etc.): See PT comments for BP      Pertinent Vitals/Pain Pain Assessment Pain Assessment: No/denies pain    Home Living                          Prior Function            PT Goals (current goals can now be found in the care plan section) Acute Rehab PT Goals Patient Stated Goal: improve mobility PT Goal  Formulation: With patient Time For Goal Achievement: 04/06/23 Potential to Achieve Goals: Good Progress towards PT goals: Progressing toward goals    Frequency    Min 3X/week      PT Plan      Co-evaluation              AM-PAC PT "6 Clicks" Mobility   Outcome Measure  Help needed turning from your back to your side while in a flat bed without using bedrails?: A Lot Help needed moving from lying on your back to sitting on the side of a flat bed without using bedrails?: A Lot Help needed moving to and from a bed to a chair (including a wheelchair)?: A Little Help needed standing up from a chair using your arms (e.g., wheelchair or bedside chair)?: A Little Help needed to walk in hospital room?: Total Help needed climbing 3-5 steps with a railing? : Total 6 Click Score: 12    End of Session Equipment Utilized During Treatment: Gait belt;Oxygen Activity Tolerance: Patient limited by fatigue Patient left: in chair;with call bell/phone within reach;with chair alarm set   PT Visit Diagnosis: Unsteadiness on feet (R26.81);Other abnormalities of gait and mobility (R26.89);Muscle weakness (generalized) (M62.81);Difficulty in walking, not elsewhere classified (R26.2)  Time: 1610-9604 PT Time Calculation (min) (ACUTE ONLY): 38 min  Charges:    $Gait Training: 23-37 mins $Therapeutic Activity: 8-22 mins PT General Charges $$ ACUTE PT VISIT: 1 Visit                     [address], SPT     [place] 03/25/2023, 1:09 PM

## 2023-03-25 NOTE — Plan of Care (Signed)
  Problem: Clinical Measurements: Goal: Ability to maintain clinical measurements within normal limits will improve Outcome: Progressing   Problem: Clinical Measurements: Goal: Will remain free from infection Outcome: Progressing   Problem: Clinical Measurements: Goal: Diagnostic test results will improve Outcome: Progressing   Problem: Clinical Measurements: Goal: Respiratory complications will improve Outcome: Progressing   Problem: Clinical Measurements: Goal: Cardiovascular complication will be avoided Outcome: Progressing   Problem: Activity: Goal: Risk for activity intolerance will decrease Outcome: Progressing   Problem: Nutrition: Goal: Adequate nutrition will be maintained Outcome: Progressing   Problem: Elimination: Goal: Will not experience complications related to bowel motility Outcome: Progressing

## 2023-03-25 NOTE — Plan of Care (Signed)
  Problem: Health Behavior/Discharge Planning: Goal: Ability to manage health-related needs will improve Outcome: Progressing   Problem: Clinical Measurements: Goal: Respiratory complications will improve Outcome: Progressing Goal: Cardiovascular complication will be avoided Outcome: Progressing   Problem: Elimination: Goal: Will not experience complications related to bowel motility Outcome: Progressing Goal: Will not experience complications related to urinary retention Outcome: Progressing   Problem: Pain Managment: Goal: General experience of comfort will improve and/or be controlled Outcome: Progressing   Problem: Safety: Goal: Ability to remain free from injury will improve Outcome: Progressing   Problem: Cardiac: Goal: Ability to achieve and maintain adequate cardiopulmonary perfusion will improve Outcome: Progressing   Problem: Clinical Measurements: Goal: Ability to avoid or minimize complications of infection will improve Outcome: Progressing

## 2023-03-25 NOTE — Progress Notes (Addendum)
 Patient Name: [name] Date of Encounter: 03/25/2023 [facility] Cardiologist: Armanda Magic, MD   Interval Summary  .    Patient resting in the bed, still on 4 L of oxygen via [place] She has been the most alert and oriented today compared to prior days  She states she is not necessarily feeling better or worse compared to the rest of this week but definitely feel worse than baseline   Vital Signs .    Vitals:   03/24/23 1932 03/24/23 2050 03/25/23 0027 03/25/23 0302  BP: 113/79  120/87 103/76  Pulse: (!) 120 (!) 112 (!) 124 (!) 114  Resp: 11 20 19 12   Temp: 98.2 F (36.8 C)  97.9 F (36.6 C) 97.8 F (36.6 C)  TempSrc: Oral  Oral Oral  SpO2: 96% 94% 92% 96%  Weight:    52.1 kg  Height:        Intake/Output Summary (Last 24 hours) at 03/25/2023 0937 Last data filed at 03/25/2023 0800 Gross per 24 hour  Intake 237 ml  Output 1150 ml  Net -913 ml      03/25/2023    3:02 AM 03/24/2023    5:00 AM 03/23/2023    5:00 AM  Last 3 Weights  Weight (lbs) 114 lb 13.8 oz 114 lb 13.8 oz 117 lb 8.1 oz  Weight (kg) 52.1 kg 52.1 kg 53.3 kg      Telemetry/ECG    Atrial fibrillation with HR 110-130s - Personally Reviewed  Physical Exam .   GEN: No acute distress, awake and alert, tremulous, on 4 L oxygen via [place]: No JVD Cardiac: irregular rhythm, tachycardic, no murmurs, rubs, or gallops.  Respiratory: good air movement, mild crackles bilaterally. GI: Soft, nontender, non-distended  MS: No edema  Assessment & Plan .   Alezandra CANDIES PALM is a 78 y.o. female with a hx of HFpEF, CAD s/p multiple interventions (DES to RCA in 2011 for MI, DES to RCA on 06/2010, DES to LAD in 2014), hypertension, hyperlipidemia, aortic stenosis, esophageal strictures s/p dilation, GERD, hypothyroidism, renal insufficiency, 2017 VATS with chronic bronchiolitis without ILD, DVT in 2019, pericardial cyst s/p resection who is admitted for acute on chronic HFpEF, new onset A. Fib. Of note, chart  mentions possible prior h/o ILD though she saw pulmonology (last in 2021) who obtained CT scanning and did not feel this was the case. Chronic respiratory failure also listed but pt not on home O2 prior to admission.  Acute on chronic HFpEF with new R V dysfunction Moderate low flow, low gradient aortic stenosis  Acute respiratory failure requiring oxygen  Echo this admission showed: EF 60-65%, moderate LVH, moderately reduced RV function, moderately enlarged RV, biatrial enlargement, mild MR, mild to moderate TR, low-flow, low-gradient AS (mean gradient 8 mmHg), dilated IVC  Weight today 114 lb Noted to be net -1.9 L but urine output likely not charted accurately  Initial CXR showed significant vascular congestion -- edema vs. PNA, with elevated pro-calcitonin raising question of the latter, not presently on antibiotic therapy Creatinine elevated to 1.67 today, 1.59 yesterday  BNP 1,056 on admission -- ordered updated level this morning Patient states she does not wearing oxygen at baseline but has strong history of lung disease  Continue strict I&O's, daily weights Most of PTA GDMT has been held due to hypotension  Ordered updated procal to trend and high rest CT scan to further evaluate lungs  Per Dr. Servando Salina, ordered IV Lasix 40 mg x  1 dose and midodrine 5 mg x 1 and monitor response    New onset persistent atrial fibrillation with RVR CHADS2VASc score is 7 Still currently in atrial fibrillation, HR ranging from 110-130s this AM  TSH elevated at 6.3, free T4 elevated 1.48 -- known history of hypothyroidism, currently on synthroid  Hemoccult was negative so less suspicious for GI bleed -- ordered anemia panel to be drawn this AM Continue IV amiodarone for now -- will transition to PO after evaluating response to IV Lasix and after results of CT scan are in Hemoglobin stable in 9's the last 3 days -- switching from IV heparin to low-dose Eliquis dosing/transitioning per pharmacy  Per Dr. Servando Salina  deferring consideration of TEE DCCV at this time Not currently on any BB/CCB due to hypotension, not on digoxin due to renal issue   CAD s/p DES to RCA 2011, DES to RCA 06/2010, DES to LAD 2014 Hyperlipidemia  Denies any current anginal symptoms  CrCl currently <30, will switch rosuvastatin to equivalent dose of atorvastatin given max dose of Crestor is only 10mg  with this renal clearance Discontinued ASA 2/2 bleeding risk    AKI on CKD 3b - prior baseline variable, but last ~1.4 in 11/2022 - continue to follow - consider MM eval given renal dysfunction and concomitant anemia, will defer to IM  Hypertension Patient has had episodes of hypotension this admission, requiring episodic midodrine  Most recent BP 103/76   Per primary Hypothyroidism Possible cellulitis GERD Normocytic anemia - FOBT negative, getting anemia panel today Seizure disorder  Electrolyte disturbances   For questions or updates, please contact [facility] Please consult [email or web address] for contact info under  Signed, Olena Leatherwood, PA-C   Patient seen and examined, note reviewed with the signed Advanced Practice Provider. I personally reviewed laboratory data, imaging studies and relevant notes. I independently examined the patient and formulated the important aspects of the plan. I have personally discussed the plan with the patient and/or family. Comments or changes to the note/plan are indicated below.   She is more alert today.  Telemetry showed A-fib RVR heart rates hanging into the 120s currently.  She is on amiodarone drip we will continue this.  If needed will give a bolus to help with heart rate control.  Unfortunately her blood pressure marginal difficult to give AV nodal blockers in terms of metoprolol or Cardizem.  In terms of anticoagulation she is on heparin drip I agree with transitioning her to Eliquis hemoglobin has been stable.  Hemoccult negative  Still with high oxygen demand  which could be pulmonary, but in the meantime we will do a trial of Lasix 40 mg daily and see her response.  Agree with the high-resolution CT.  Thomasene Ripple DO, MS [facility] Attending Cardiologist [facility]  [address] [place], [place] [address] [phone] Website: [email or web address]

## 2023-03-25 NOTE — Discharge Instructions (Signed)

## 2023-03-26 ENCOUNTER — Inpatient Hospital Stay (HOSPITAL_COMMUNITY)

## 2023-03-26 DIAGNOSIS — R0603 Acute respiratory distress: Secondary | ICD-10-CM | POA: Diagnosis not present

## 2023-03-26 DIAGNOSIS — I5033 Acute on chronic diastolic (congestive) heart failure: Secondary | ICD-10-CM | POA: Diagnosis not present

## 2023-03-26 DIAGNOSIS — J90 Pleural effusion, not elsewhere classified: Secondary | ICD-10-CM | POA: Diagnosis not present

## 2023-03-26 DIAGNOSIS — J984 Other disorders of lung: Secondary | ICD-10-CM | POA: Diagnosis not present

## 2023-03-26 DIAGNOSIS — R0689 Other abnormalities of breathing: Secondary | ICD-10-CM | POA: Diagnosis not present

## 2023-03-26 LAB — CBC
HCT: 29.9 % — ABNORMAL LOW (ref 36.0–46.0)
Hemoglobin: 9.3 g/dL — ABNORMAL LOW (ref 12.0–15.0)
MCH: 29 pg (ref 26.0–34.0)
MCHC: 31.1 g/dL (ref 30.0–36.0)
MCV: 93.1 fL (ref 80.0–100.0)
Platelets: 337 10*3/uL (ref 150–400)
RBC: 3.21 MIL/uL — ABNORMAL LOW (ref 3.87–5.11)
RDW: 13.9 % (ref 11.5–15.5)
WBC: 10.4 10*3/uL (ref 4.0–10.5)
nRBC: 0 % (ref 0.0–0.2)

## 2023-03-26 LAB — BASIC METABOLIC PANEL
Anion gap: 9 (ref 5–15)
BUN: 32 mg/dL — ABNORMAL HIGH (ref 8–23)
CO2: 26 mmol/L (ref 22–32)
Calcium: 7 mg/dL — ABNORMAL LOW (ref 8.9–10.3)
Chloride: 103 mmol/L (ref 98–111)
Creatinine, Ser: 1.58 mg/dL — ABNORMAL HIGH (ref 0.44–1.00)
GFR, Estimated: 34 mL/min — ABNORMAL LOW (ref >60)
Glucose, Bld: 118 mg/dL — ABNORMAL HIGH (ref 70–99)
Potassium: 3.4 mmol/L — ABNORMAL LOW (ref 3.5–5.1)
Sodium: 138 mmol/L (ref 135–145)

## 2023-03-26 LAB — MRSA NEXT GEN BY PCR, NASAL: MRSA by PCR Next Gen: NOT DETECTED

## 2023-03-26 MED ORDER — AMIODARONE IV BOLUS ONLY 150 MG/100ML
150.0000 mg | Freq: Once | INTRAVENOUS | Status: AC
  Administered 2023-03-26: 150 mg via INTRAVENOUS
  Filled 2023-03-26: qty 100

## 2023-03-26 MED ORDER — LEVALBUTEROL HCL 0.63 MG/3ML IN NEBU
0.6300 mg | INHALATION_SOLUTION | Freq: Four times a day (QID) | RESPIRATORY_TRACT | Status: DC | PRN
  Administered 2023-03-27 (×2): 0.63 mg via RESPIRATORY_TRACT
  Filled 2023-03-26 (×4): qty 3

## 2023-03-26 MED ORDER — FUROSEMIDE 10 MG/ML IJ SOLN: 40.0000 mg | Freq: Once | INTRAMUSCULAR | Status: DC

## 2023-03-26 MED ORDER — FUROSEMIDE 10 MG/ML IJ SOLN
40.0000 mg | Freq: Once | INTRAMUSCULAR | Status: AC
  Administered 2023-03-26: 40 mg via INTRAVENOUS
  Filled 2023-03-26: qty 4

## 2023-03-26 MED ORDER — POTASSIUM CHLORIDE CRYS ER 20 MEQ PO TBCR
40.0000 meq | EXTENDED_RELEASE_TABLET | ORAL | Status: AC
  Administered 2023-03-26 (×2): 40 meq via ORAL
  Filled 2023-03-26 (×2): qty 2

## 2023-03-26 MED ORDER — FUROSEMIDE 10 MG/ML IJ SOLN
40.0000 mg | Freq: Once | INTRAMUSCULAR | Status: AC
Start: 2023-03-26 — End: 2023-03-26
  Administered 2023-03-26: 40 mg via INTRAVENOUS
  Filled 2023-03-26: qty 4

## 2023-03-26 NOTE — Progress Notes (Signed)
 Progress Note  Patient Name: [name] Date of Encounter: 03/26/2023  Primary Cardiologist: Armanda Magic, MD   Subjective   Patient seen and examined at her bedside. She was sitting up in bed when arrived.   Inpatient Medications    Scheduled Meds:  apixaban  2.5 mg Oral BID   atorvastatin  80 mg Oral Daily   Chlorhexidine Gluconate Cloth  6 each Topical Daily   feeding supplement  237 mL Oral BID BM   levETIRAcetam  500 mg Oral BID   levothyroxine  75 mcg Oral Q0600   pantoprazole  40 mg Oral BID   polyethylene glycol  17 g Oral Daily   potassium chloride  40 mEq Oral Q4H   senna-docusate  1 tablet Oral BID   sodium chloride flush  10-40 mL Intracatheter Q12H   Continuous Infusions:  amiodarone 30 mg/hr (03/25/23 2350)   PRN Meds: acetaminophen **OR** acetaminophen, ALPRAZolam, HYDROcodone-acetaminophen, loperamide, metoprolol tartrate, morphine injection, ondansetron **OR** ondansetron (ZOFRAN) IV, mouth rinse, sodium chloride flush   Vital Signs    Vitals:   03/25/23 1521 03/25/23 2018 03/25/23 2316 03/26/23 0318  BP:  105/87 105/78 126/80  Pulse:  (!) 120 (!) 122 (!) 117  Resp:  (!) 21 (!) 22 20  Temp:  98.1 F (36.7 C) 97.9 F (36.6 C) 97.6 F (36.4 C)  TempSrc:  Oral Oral Oral  SpO2: (!) 89% 93% 96% 100%  Weight:    49.7 kg  Height:        Intake/Output Summary (Last 24 hours) at 03/26/2023 1148 Last data filed at 03/26/2023 0408 Gross per 24 hour  Intake 1021.26 ml  Output 2100 ml  Net -1078.74 ml   Filed Weights   03/24/23 0500 03/25/23 0302 03/26/23 0318  Weight: 52.1 kg 52.1 kg 49.7 kg    Telemetry    Sinus rhythm  - Personally Reviewed  ECG     - Personally Reviewed  Physical Exam    General: Comfortable Head: Atraumatic, normal size  Eyes: PEERLA, EOMI  Neck: Supple, normal JVD Cardiac: Normal S1, S2; RRR; no murmurs, rubs, or gallops Lungs: Clear to auscultation bilaterally Abd: Soft, nontender, no hepatomegaly  Ext:  warm, no edema Musculoskeletal: No deformities, BUE and BLE strength normal and equal Skin: Warm and dry, no rashes   Neuro: Alert and oriented to person, place, time, and situation, CNII-XII grossly intact, no focal deficits  Psych: Normal mood and affect   Labs    Chemistry Recent Labs  Lab 03/21/23 0407 03/22/23 0447 03/23/23 0405 03/24/23 0545 03/25/23 0500 03/26/23 0500  NA 139 136 138 137 137 138  K 3.3* 4.1 3.3* 3.4* 4.4 3.4*  CL 106 106 104 105 108 103  CO2 20* 19* 21* 24 21* 26  GLUCOSE 94 105* 161* 165* 162* 118*  BUN 42* 54* 56* 46* 37* 32*  CREATININE 1.42* 1.77* 1.91* 1.59* 1.67* 1.58*  CALCIUM 6.8* 6.0* 6.4* 6.8* 7.0* 7.0*  PROT 5.7*  --   --   --   --   --   ALBUMIN 2.7* 2.7* 2.4* 2.3*  --   --   AST 40  --   --   --   --   --   ALT 26  --   --   --   --   --   ALKPHOS 72  --   --   --   --   --   BILITOT 1.5*  --   --   --   --   --  GFRNONAA 38* 29* 27* 33* 32* 34*  ANIONGAP 13 11 13 8 8 9      Hematology Recent Labs  Lab 03/24/23 0545 03/25/23 0500 03/25/23 0933 03/26/23 0500  WBC 9.9 10.0  --  10.4  RBC 3.05* 3.11* 3.25* 3.21*  HGB 9.1* 9.0*  --  9.3*  HCT 28.2* 29.3*  --  29.9*  MCV 92.5 94.2  --  93.1  MCH 29.8 28.9  --  29.0  MCHC 32.3 30.7  --  31.1  RDW 14.1 14.2  --  13.9  PLT 294 302  --  337    Cardiac EnzymesNo results for input(s): "TROPONINI" in the last 168 hours. No results for input(s): "TROPIPOC" in the last 168 hours.   BNP Recent Labs  Lab 03/20/23 1336 03/25/23 0933  BNP 1,056.4* 1,201.7*     DDimer No results for input(s): "DDIMER" in the last 168 hours.   Radiology    No results found.  Cardiac Studies   Echo reviewed   Patient Profile     78 y.o. female  a hx of HFpEF, CAD s/p multiple interventions (DES to RCA in 2011 for MI, DES to RCA on 06/2010, DES to LAD in 2014), hypertension, hyperlipidemia, aortic stenosis, esophageal strictures s/p dilation, GERD, hypothyroidism, renal insufficiency, 2017 VATS  with chronic bronchiolitis without ILD, DVT in 2019, pericardial cyst s/p resection who is admitted for acute on chronic HFpEF, new onset A. Fib.   Assessment & Plan    Acute on chronic HFpEF with new RV dysfunction  Mild to moderate Tricuspid regurgitation Moderate aortic stenosis ( moderate low flow/low gradient )  New onset atrial fibrillation with rvr  CAD s/p PCI AKI on CKD 3b Hypertension   She remains in atrial fibrillation will give a bolus load of amiodarone today. Then continue the amiodarone gtt.  Continue her current Eliquis 2.5 mg BID.  Favorable response to the dose of Lasix given yesterday at a negative balance (-1078 ml)  Will give a dose of 40 mg of IV Lasix again today.  Cr 1.58 today slightly Improved compared to yesterday Still waiting for the CT chest results.       For questions or updates, please contact [facility] Please consult [email or web address] for contact info under Cardiology/STEMI.      Signed, Thomasene Ripple, DO  03/26/2023, 11:48 AM

## 2023-03-26 NOTE — Progress Notes (Signed)
 RT on 6E, responded to code blue alarm to 6E09. Rapid vs code. Patient sitting in bed, has pulse, able to respond. Placed on BIPAP @ bedside due to desaturation. Rapid RN, MD and team arrive. Removed BIPAP, placed on 10L [place]. No distress at this time.

## 2023-03-26 NOTE — Progress Notes (Signed)
 Called by RN to come assess patient since her O2 requirements have been increasing. Upon my arrival patient on 6L with SpO2 92%. Her breathing is labored & she is shaking constantly & says she doesn't know why. I told her I needed to put the Bipap mask back on, but she refused to wear. I placed on [place] at 10L. Her SpO2 now is hanging around 95%. Breathing still labored, BS- fine crackles. I asked RN if patient was getting lasix & she said yes. Call RT back if patient's breathing worsens.

## 2023-03-26 NOTE — Progress Notes (Signed)
 PROGRESS NOTE    [name]  ZOX:[id number] DOB: [birth year] DOA: 03/20/2023 PCP: Merri Brunette, MD   Brief Narrative:  Patient with PMH of CAD with PCI to RCA, recent cath 9/23 with nonobstructive CAD and patent stents, mild aortic stenosis, echo EF 6065% in 2023, HTN, anxiety, chronic pain syndrome, GERD, hypothyroidism, HLD, ILD, CKD 3A presented to the hospital with complaints of leg swelling and shortness of breath. Went to see PCP in 2/27 there was some trace edema noted but no significant shortness of breath.   niece found the patient with very short of breath and therefore patient was brought to the hospital. Currently being treated for acute on chronic HFpEF.  Assessment & Plan:   Principal Problem:   Acute on chronic diastolic (congestive) heart failure (HCC) Active Problems:   Hypertension   Hyperlipidemia   Hypothyroidism   History of  seizures   Depression   CAD in native artery   GERD   IBS   ILD (interstitial lung disease) (HCC)   Mild aortic stenosis   Chronic pain syndrome   Chronic kidney disease, stage 3a (HCC)  Acute hypoxic respiratory failure secondary to acute on chronic diastolic CHF: Upon admission, BNP significantly elevated 1056.  Significant swelling in the leg.  Tripoding requiring BiPAP therapy and NRB in the ER. Chest x-ray shows significant vascular congestion.  There is comment about pneumonia although pneumonia is less likely. Echocardiogram 60 to 65% in September 2023. Last cath 9/23 40% stenosis and RPDA and second diagonal, otherwise nonobstructive CAD with patent stent in RCA. Continue BiPAP as needed.  Cardiology following and managing.  She is not on any diuretics and I defer to cardiology for that.  Patient still requiring 4 L of oxygen.  CT chest high-resolution was completed yesterday, results pending.  AKI on CKD stage IIIb: Baseline creatinine appears to be around 1.3-1.5, peaked at 1.91 but improved to 1.6 on 03/24/2023 and has been  stable.  Hypokalemia: Will replenish.  Acute anemia: Almost 3 months ago, patient's hemoglobin was normal.  She presents with 10.6 which gradually dropped to 8.6 but improved to 9.3 without any transfusion.  No reports of hematemesis, melena or hematochezia.  Cardiology concerned about GI bleed and ordered FOBT which was negative.    History of SVT but here with paroxysmal atrial fibrillation with RVR. Patient has history of SVT and is on Cardizem PTA. Cardizem was on hold on admission to allow room for diuresis. Found to have A-fib with RVR early in the morning of 3/10.  Cardiology managing, currently on amiodarone and heparin drip.   Acute urinary retention. Was unable to urinate on her own with bladder scan showing 400+ cc.  Placed Foley catheter which was eventually discontinued on 03/25/2023 and patient has had many successful voiding trials.   Possible cellulitis. Patient was presumed to have cellulitis of bilateral legs based on appearance.  She was started on cefazolin.  On my examination, I did not see any erythema, no warmth or tenderness.  Looks like antibiotics were started on 03/20/2023, will continue for total of 5 to 7 days.   History of CAD and mild aortic stenosis/elevated troponin. Last 2023.  Nonobstructive CAD with patent stent.Troponins minimally elevated but EKG does not show any evidence of acute ischemia.  Echo shows preserved EF.  No significant wall abnormality.  Likely demand ischemia.  Hypertension: Blood pressure on the low side.  All antihypertensives on hold.   Acute metabolic encephalopathy. Likely in the setting of  hypotension. Per previous hospitalist documentation  "blood pressure corrected with midodrine with improvement in mentation".  However I did not see any midodrine ordered.  Patient is alert and oriented, needs some prompts though.   Poor venous access. RN requested a PICC line.  Placed.    GERD. Continue PPI.   Hypothyroidism. Continuing  Synthroid.  Both TSH and free T4 elevated.  I reduced Synthroid from 88 mcg to 75 mcg starting 03/24/2023.  Repeat TSH in 6 to 8 weeks.   History of seizures. Continuing Keppra.  Switching to oral.   Depression. On Cymbalta, will resume tomorrow.   HLD. Continuing statin.   Goals of care conversation. Previous hospitalist discussed with the patient as well as in presence of niece.  Patient wanted to be DNR.   Prolonged QTc. Minimize QT prolonging medications.  At risk of further worsening in the setting of amiodarone use.  Monitor and EKG and telemetry.   Late entry/addendum: Around 1:45 PM on 03/26/2023, rapid response was called due to shortness of breath and increasing oxygen demand.  Patient was noted to have crackles.  Chest x-ray ordered, patient was started on Amio drip.  Patient had already received Lasix in the morning.  I had ordered another Lasix dose.  Followed by this, the nurse called CODE BLUE on this patient around 2:30 PM while the patient was breathing and there was no loss of pulse.  CODE BLUE team arrived at the bedside and found patient sitting in bed, has pulse, able to respond. Placed on BIPAP @ bedside due to desaturation.  BiPAP was removed and she was placed on 10 L salter.  I discussed with the resident who attended the code.  Later on we weaned her to 4 L.  Patient appeared much more comfortable.  DVT prophylaxis: apixaban (ELIQUIS) tablet 2.5 mg Start: 03/25/23 1045Heparin   Code Status: Do not attempt resuscitation (DNR) PRE-ARREST INTERVENTIONS DESIRED  Family Communication:  None present at bedside.  Plan of care discussed with patient in length  Status is: Inpatient Remains inpatient appropriate because: Still significantly hypoxic , needs inpatient management.   Estimated body mass index is 21.4 kg/m as calculated from the following:   Height as of this encounter: 5' (1.524 m).   Weight as of this encounter: 49.7 kg.    Nutritional Assessment: Body  mass index is 21.4 kg/m.Marland Kitchen Seen by dietician.  I agree with the assessment and plan as outlined below: Nutrition Status:        . Skin Assessment: I have examined the patient's skin and I agree with the wound assessment as performed by the wound care RN as outlined below:    Consultants:  Cardiology  Procedures:  None  Antimicrobials:  Anti-infectives (From admission, onward)    Start     Dose/Rate Route Frequency Ordered Stop   03/20/23 2200  ceFAZolin (ANCEF) IVPB 1 g/50 mL premix        1 g 100 mL/hr over 30 Minutes Intravenous Every 12 hours 03/20/23 1754 03/23/23 2210         Subjective: Patient seen and examined.  No complaints.  Objective: a Vitals:   03/25/23 1521 03/25/23 2018 03/25/23 2316 03/26/23 0318  BP:  105/87 105/78 126/80  Pulse:  (!) 120 (!) 122 (!) 117  Resp:  (!) 21 (!) 22 20  Temp:  98.1 F (36.7 C) 97.9 F (36.6 C) 97.6 F (36.4 C)  TempSrc:  Oral Oral Oral  SpO2: (!) 89% 93% 96% 100%  Weight:    49.7 kg  Height:        Intake/Output Summary (Last 24 hours) at 03/26/2023 0823 Last data filed at 03/26/2023 0408 Gross per 24 hour  Intake 1258.26 ml  Output 2100 ml  Net -841.74 ml   Filed Weights   03/24/23 0500 03/25/23 0302 03/26/23 0318  Weight: 52.1 kg 52.1 kg 49.7 kg    Examination:  General exam: Appears calm and comfortable  Respiratory system: Rales bilaterally, respiratory effort normal. Cardiovascular system: S1 & S2 heard, RRR. No JVD, murmurs, rubs, gallops or clicks. No pedal edema. Gastrointestinal system: Abdomen is nondistended, soft and nontender. No organomegaly or masses felt. Normal bowel sounds heard. Central nervous system: Alert and oriented. No focal neurological deficits. Extremities: Symmetric 5 x 5 power. Skin: No rashes, lesions or ulcers.    Data Reviewed: I have personally reviewed following labs and imaging studies  CBC: Recent Labs  Lab 03/23/23 0405 03/23/23 1700 03/24/23 0545  03/25/23 0500 03/26/23 0500  WBC 10.5 10.7* 9.9 10.0 10.4  NEUTROABS  --  8.7* 7.5 7.7  --   HGB 8.6* 9.3* 9.1* 9.0* 9.3*  HCT 26.5* 28.7* 28.2* 29.3* 29.9*  MCV 91.7 91.7 92.5 94.2 93.1  PLT 269 313 294 302 337   Basic Metabolic Panel: Recent Labs  Lab 03/21/23 0830 03/22/23 0447 03/23/23 0405 03/24/23 0545 03/25/23 0500 03/26/23 0500  NA  --  136 138 137 137 138  K  --  4.1 3.3* 3.4* 4.4 3.4*  CL  --  106 104 105 108 103  CO2  --  19* 21* 24 21* 26  GLUCOSE  --  105* 161* 165* 162* 118*  BUN  --  54* 56* 46* 37* 32*  CREATININE  --  1.77* 1.91* 1.59* 1.67* 1.58*  CALCIUM  --  6.0* 6.4* 6.8* 7.0* 7.0*  MG 1.0* 2.5* 2.3 2.1  --   --   PHOS  --  8.6* 5.7* 3.9  --   --    GFR: Estimated Creatinine Clearance: 21.8 mL/min (A) (by C-G formula based on SCr of 1.58 mg/dL (H)). Liver Function Tests: Recent Labs  Lab 03/21/23 0407 03/22/23 0447 03/23/23 0405 03/24/23 0545  AST 40  --   --   --   ALT 26  --   --   --   ALKPHOS 72  --   --   --   BILITOT 1.5*  --   --   --   PROT 5.7*  --   --   --   ALBUMIN 2.7* 2.7* 2.4* 2.3*   No results for input(s): "LIPASE", "AMYLASE" in the last 168 hours. No results for input(s): "AMMONIA" in the last 168 hours. Coagulation Profile: No results for input(s): "INR", "PROTIME" in the last 168 hours. Cardiac Enzymes: No results for input(s): "CKTOTAL", "CKMB", "CKMBINDEX", "TROPONINI" in the last 168 hours. BNP (last 3 results) No results for input(s): "PROBNP" in the last 8760 hours. HbA1C: No results for input(s): "HGBA1C" in the last 72 hours. CBG: Recent Labs  Lab 03/21/23 2241  GLUCAP 115*   Lipid Profile: No results for input(s): "CHOL", "HDL", "LDLCALC", "TRIG", "CHOLHDL", "LDLDIRECT" in the last 72 hours. Thyroid Function Tests: No results for input(s): "TSH", "T4TOTAL", "FREET4", "T3FREE", "THYROIDAB" in the last 72 hours.  Anemia Panel: Recent Labs    03/25/23 0933  VITAMINB12 417  FOLATE 12.0  FERRITIN 302   TIBC 213*  IRON 14*  RETICCTPCT 2.6   Sepsis Labs: Recent  Labs  Lab 03/21/23 0830 03/25/23 0933  PROCALCITON 0.72 0.33    Recent Results (from the past 240 hours)  Resp panel by RT-PCR (RSV, Flu A&B, Covid) Anterior Nasal Swab     Status: None   Collection Time: 03/20/23  2:00 PM   Specimen: Anterior Nasal Swab  Result Value Ref Range Status   SARS Coronavirus 2 by RT PCR NEGATIVE NEGATIVE Final   Influenza A by PCR NEGATIVE NEGATIVE Final   Influenza B by PCR NEGATIVE NEGATIVE Final    Comment: (NOTE) The Xpert Xpress SARS-CoV-2/FLU/RSV plus assay is intended as an aid in the diagnosis of influenza from Nasopharyngeal swab specimens and should not be used as a sole basis for treatment. Nasal washings and aspirates are unacceptable for Xpert Xpress SARS-CoV-2/FLU/RSV testing.  Fact Sheet for Patients: BloggerCourse.com  Fact Sheet for Healthcare Providers: SeriousBroker.it  This test is not yet approved or cleared by the [place] FDA and has been authorized for detection and/or diagnosis of SARS-CoV-2 by FDA under an Emergency Use Authorization (EUA). This EUA will remain in effect (meaning this test can be used) for the duration of the COVID-19 declaration under Section 564(b)(1) of the Act, 21 U.S.C. section 360bbb-3(b)(1), unless the authorization is terminated or revoked.     Resp Syncytial Virus by PCR NEGATIVE NEGATIVE Final    Comment: (NOTE) Fact Sheet for Patients: BloggerCourse.com  Fact Sheet for Healthcare Providers: SeriousBroker.it  This test is not yet approved or cleared by the [place] FDA and has been authorized for detection and/or diagnosis of SARS-CoV-2 by FDA under an Emergency Use Authorization (EUA). This EUA will remain in effect (meaning this test can be used) for the duration of the COVID-19 declaration under Section 564(b)(1)  of the Act, 21 U.S.C. section 360bbb-3(b)(1), unless the authorization is terminated or revoked.  Performed at [facility], [address] [place], [place] [address]   MRSA Next Gen by PCR, Nasal     Status: None   Collection Time: 03/25/23 11:29 PM   Specimen: Nasal Mucosa; Nasal Swab  Result Value Ref Range Status   MRSA by PCR Next Gen NOT DETECTED NOT DETECTED Final    Comment: (NOTE) The GeneXpert MRSA Assay (FDA approved for NASAL specimens only), is one component of a comprehensive MRSA colonization surveillance program. It is not intended to diagnose MRSA infection nor to guide or monitor treatment for MRSA infections. Test performance is not FDA approved in patients less than 60 years old. Performed at [facility], [address], [place], [place] [address]      Radiology Studies: No results found.   Scheduled Meds:  apixaban  2.5 mg Oral BID   atorvastatin  80 mg Oral Daily   Chlorhexidine Gluconate Cloth  6 each Topical Daily   feeding supplement  237 mL Oral BID BM   levETIRAcetam  500 mg Oral BID   levothyroxine  75 mcg Oral Q0600   pantoprazole  40 mg Oral BID   polyethylene glycol  17 g Oral Daily   potassium chloride  40 mEq Oral Q4H   senna-docusate  1 tablet Oral BID   sodium chloride flush  10-40 mL Intracatheter Q12H   Continuous Infusions:  amiodarone 30 mg/hr (03/25/23 2350)     LOS: 6 days   Hughie Closs, MD [facility]  03/26/2023, 8:23 AM   *Please note that this is a verbal dictation therefore any spelling or grammatical errors are due to the "Dragon  Medical One" system interpretation.  Please page via Amion and do not message via secure chat for urgent patient care matters. Secure chat can be used for non urgent patient care matters.  How to contact the [facility] Attending or Consulting provider 7A - 7P or covering provider during after hours 7P -7A, for this patient?  Check the care team in [facility] and look for a)  attending/consulting [facility] provider listed and b) the [facility] team listed. Page or secure chat 7A-7P. Log into [email or web address] and use [place]'s universal password to access. If you do not have the password, please contact the hospital operator. Locate the [facility] provider you are looking for under [facility] and page to a number that you can be directly reached. If you still have difficulty reaching the provider, please page the [facility] (Director on Call) for the Hospitalists listed on amion for assistance.

## 2023-03-26 NOTE — Significant Event (Signed)
 Rapid Response Event Note   Reason for Call :  SOB with increased O2 requirements  Initial Focused Assessment:  On arrival, pt sitting in bed A&O, stating her breathing feels "much better". Lung sounds with crackles noted.   HR 121, BP 134/78, RR 22, spO2 100% on 10L [place].   Pt refused to wear Bipap with RT prior to my arrival. She did receive Lasix with UOP noted from purewick.    Interventions:  CXR ordered Amio gtt restarted with charge RN MD aware  Plan of Care:  Continue to monitor pt respiratory status. RN to call with any changes or concerns.    Event Summary:   MD Notified: PTA Call Time: 1320 (notified by RT) Arrival Time: 1330 End Time: 1346  Mordecai Rasmussen, RN  1434: Code Blue called on pt for respiratory distress. Never lost pulse. spO2 dropped to 60%. Pt placed on Bipap and spO2 improved to 99%. HR 130s (on amio gtt). Prior to event end, pt placed back on 10L salter with sats 99-100%. Primary Md notified by RN. Code team to bedside to access.

## 2023-03-27 DIAGNOSIS — I5033 Acute on chronic diastolic (congestive) heart failure: Secondary | ICD-10-CM | POA: Diagnosis not present

## 2023-03-27 LAB — BASIC METABOLIC PANEL
Anion gap: 12 (ref 5–15)
BUN: 30 mg/dL — ABNORMAL HIGH (ref 8–23)
CO2: 28 mmol/L (ref 22–32)
Calcium: 7.1 mg/dL — ABNORMAL LOW (ref 8.9–10.3)
Chloride: 100 mmol/L (ref 98–111)
Creatinine, Ser: 1.6 mg/dL — ABNORMAL HIGH (ref 0.44–1.00)
GFR, Estimated: 33 mL/min — ABNORMAL LOW (ref >60)
Glucose, Bld: 120 mg/dL — ABNORMAL HIGH (ref 70–99)
Potassium: 3.7 mmol/L (ref 3.5–5.1)
Sodium: 140 mmol/L (ref 135–145)

## 2023-03-27 LAB — BRAIN NATRIURETIC PEPTIDE: B Natriuretic Peptide: 876.3 pg/mL — ABNORMAL HIGH (ref 0.0–100.0)

## 2023-03-27 LAB — CBC
HCT: 31.1 % — ABNORMAL LOW (ref 36.0–46.0)
Hemoglobin: 9.9 g/dL — ABNORMAL LOW (ref 12.0–15.0)
MCH: 29.1 pg (ref 26.0–34.0)
MCHC: 31.8 g/dL (ref 30.0–36.0)
MCV: 91.5 fL (ref 80.0–100.0)
Platelets: 385 10*3/uL (ref 150–400)
RBC: 3.4 MIL/uL — ABNORMAL LOW (ref 3.87–5.11)
RDW: 13.9 % (ref 11.5–15.5)
WBC: 11.7 10*3/uL — ABNORMAL HIGH (ref 4.0–10.5)
nRBC: 0 % (ref 0.0–0.2)

## 2023-03-27 LAB — PROCALCITONIN: Procalcitonin: 0.27 ng/mL

## 2023-03-27 MED ORDER — ALPRAZOLAM 0.25 MG PO TABS
0.2500 mg | ORAL_TABLET | Freq: Three times a day (TID) | ORAL | Status: DC | PRN
  Administered 2023-03-28 – 2023-03-31 (×7): 0.25 mg via ORAL
  Filled 2023-03-27 (×7): qty 1

## 2023-03-27 MED ORDER — FUROSEMIDE 10 MG/ML IJ SOLN
40.0000 mg | Freq: Two times a day (BID) | INTRAMUSCULAR | Status: AC
  Administered 2023-03-27 – 2023-03-28 (×4): 40 mg via INTRAVENOUS
  Filled 2023-03-27 (×4): qty 4

## 2023-03-27 MED ORDER — METHYLPREDNISOLONE SODIUM SUCC 40 MG IJ SOLR
40.0000 mg | Freq: Two times a day (BID) | INTRAMUSCULAR | Status: AC
  Administered 2023-03-27 – 2023-03-29 (×6): 40 mg via INTRAVENOUS
  Filled 2023-03-27 (×6): qty 1

## 2023-03-27 NOTE — Plan of Care (Signed)
  Problem: Education: Goal: Knowledge of General Education information will improve Description: Including pain rating scale, medication(s)/side effects and non-pharmacologic comfort measures Outcome: Not Progressing   Problem: Health Behavior/Discharge Planning: Goal: Ability to manage health-related needs will improve Outcome: Not Progressing   Problem: Clinical Measurements: Goal: Cardiovascular complication will be avoided Outcome: Not Progressing

## 2023-03-27 NOTE — Progress Notes (Signed)
 Progress Note  Patient Name: [name] Date of Encounter: 03/27/2023  Primary Cardiologist: Armanda Magic, MD   Subjective   Patient seen and examined at her bedside. She was sitting up in bed when arrived.   Inpatient Medications    Scheduled Meds:  apixaban  2.5 mg Oral BID   atorvastatin  80 mg Oral Daily   Chlorhexidine Gluconate Cloth  6 each Topical Daily   feeding supplement  237 mL Oral BID BM   furosemide  40 mg Intravenous BID   levETIRAcetam  500 mg Oral BID   levothyroxine  75 mcg Oral Q0600   methylPREDNISolone (SOLU-MEDROL) injection  40 mg Intravenous Q12H   pantoprazole  40 mg Oral BID   polyethylene glycol  17 g Oral Daily   senna-docusate  1 tablet Oral BID   sodium chloride flush  10-40 mL Intracatheter Q12H   Continuous Infusions:  amiodarone 30 mg/hr (03/27/23 0657)   PRN Meds: acetaminophen **OR** acetaminophen, ALPRAZolam, HYDROcodone-acetaminophen, levalbuterol, loperamide, metoprolol tartrate, morphine injection, ondansetron **OR** ondansetron (ZOFRAN) IV, mouth rinse, sodium chloride flush   Vital Signs    Vitals:   03/27/23 0500 03/27/23 0730 03/27/23 0751 03/27/23 0827  BP:    104/79  Pulse:      Resp:    18  Temp:    97.9 F (36.6 C)  TempSrc:    Oral  SpO2:  100% 94%   Weight: 47.7 kg     Height:        Intake/Output Summary (Last 24 hours) at 03/27/2023 1006 Last data filed at 03/27/2023 0929 Gross per 24 hour  Intake 1322.14 ml  Output 2250 ml  Net -927.86 ml   Filed Weights   03/25/23 0302 03/26/23 0318 03/27/23 0500  Weight: 52.1 kg 49.7 kg 47.7 kg    Telemetry    Sinus rhythm  - Personally Reviewed  ECG     - Personally Reviewed  Physical Exam    General: Comfortable Head: Atraumatic, normal size  Eyes: PEERLA, EOMI  Neck: Supple, normal JVD Cardiac: Normal S1, S2; RRR; no murmurs, rubs, or gallops Lungs: Clear to auscultation bilaterally Abd: Soft, nontender, no hepatomegaly  Ext: warm, no  edema Musculoskeletal: No deformities, BUE and BLE strength normal and equal Skin: Warm and dry, no rashes   Neuro: Alert and oriented to person, place, time, and situation, CNII-XII grossly intact, no focal deficits  Psych: Normal mood and affect   Labs    Chemistry Recent Labs  Lab 03/21/23 0407 03/22/23 0447 03/23/23 0405 03/24/23 0545 03/25/23 0500 03/26/23 0500 03/27/23 0530  NA 139 136 138 137 137 138 140  K 3.3* 4.1 3.3* 3.4* 4.4 3.4* 3.7  CL 106 106 104 105 108 103 100  CO2 20* 19* 21* 24 21* 26 28  GLUCOSE 94 105* 161* 165* 162* 118* 120*  BUN 42* 54* 56* 46* 37* 32* 30*  CREATININE 1.42* 1.77* 1.91* 1.59* 1.67* 1.58* 1.60*  CALCIUM 6.8* 6.0* 6.4* 6.8* 7.0* 7.0* 7.1*  PROT 5.7*  --   --   --   --   --   --   ALBUMIN 2.7* 2.7* 2.4* 2.3*  --   --   --   AST 40  --   --   --   --   --   --   ALT 26  --   --   --   --   --   --   ALKPHOS 72  --   --   --   --   --   --  BILITOT 1.5*  --   --   --   --   --   --   GFRNONAA 38* 29* 27* 33* 32* 34* 33*  ANIONGAP 13 11 13 8 8 9 12      Hematology Recent Labs  Lab 03/25/23 0500 03/25/23 0933 03/26/23 0500 03/27/23 0530  WBC 10.0  --  10.4 11.7*  RBC 3.11* 3.25* 3.21* 3.40*  HGB 9.0*  --  9.3* 9.9*  HCT 29.3*  --  29.9* 31.1*  MCV 94.2  --  93.1 91.5  MCH 28.9  --  29.0 29.1  MCHC 30.7  --  31.1 31.8  RDW 14.2  --  13.9 13.9  PLT 302  --  337 385    Cardiac EnzymesNo results for input(s): "TROPONINI" in the last 168 hours. No results for input(s): "TROPIPOC" in the last 168 hours.   BNP Recent Labs  Lab 03/20/23 1336 03/25/23 0933  BNP 1,056.4* 1,201.7*     DDimer No results for input(s): "DDIMER" in the last 168 hours.   Radiology    DG Chest Port 1 View Result Date: 03/26/2023 CLINICAL DATA:  Difficulty breathing, acute respiratory distress EXAM: PORTABLE CHEST 1 VIEW COMPARISON:  03/22/2023, 03/20/2023 FINDINGS: 2 frontal views of the chest demonstrate right-sided PICC tip overlying atriocaval  junction. Cardiac silhouette is stable, with dense calcification of the mitral annulus again noted. Postsurgical changes are seen from prior partial right lower lobe resection. Bilateral upper lobe predominant airspace disease again identified, without appreciable change allowing for differences in positioning and technique. Small bilateral effusions. No pneumothorax. No acute bony abnormalities. IMPRESSION: 1. Upper lobe predominant bilateral airspace disease, not significantly changed since 03/22/2023 but slightly progressed since 03/20/2023. Findings could reflect worsening infection or edema. 2. Small bilateral pleural effusions. Electronically Signed   By: Sharlet Salina M.D.   On: 03/26/2023 14:53    Cardiac Studies   Echo reviewed   Patient Profile     78 y.o. female  a hx of HFpEF, CAD s/p multiple interventions (DES to RCA in 2011 for MI, DES to RCA on 06/2010, DES to LAD in 2014), hypertension, hyperlipidemia, aortic stenosis, esophageal strictures s/p dilation, GERD, hypothyroidism, renal insufficiency, 2017 VATS with chronic bronchiolitis without ILD, DVT in 2019, pericardial cyst s/p resection who is admitted for acute on chronic HFpEF, new onset A. Fib.   Assessment & Plan    Acute on chronic HFpEF with new RV dysfunction  Mild to moderate Tricuspid regurgitation Moderate aortic stenosis ( moderate low flow/low gradient )  New onset atrial fibrillation with rvr  CAD s/p PCI AKI on CKD 3b Hypertension   She remains in atrial fibrillation will give a bolus load of amiodarone today. Then continue the amiodarone gtt.  Continue her current Eliquis 2.5 mg BID.   Continues to have failure to respond to diuretics, will continue current diuretics dosing.  With small pleural effusions on her chest x-ray we will continue this diuretics and then adjust as appropriate.  Will watch kidney function closely Cr 1.6, remained stable       For questions or updates, please contact [facility] Please consult [email or web address] for contact info under Cardiology/STEMI.      Signed, Jessen Siegman, DO  03/27/2023, 10:06 AM

## 2023-03-27 NOTE — Progress Notes (Signed)
 PROGRESS NOTE    BINDU DOCTER  WUJ:[id number] DOB: [birth year] DOA: 03/20/2023 PCP: Merri Brunette, MD   Brief Narrative:  Patient with PMH of CAD with PCI to RCA, recent cath 9/23 with nonobstructive CAD and patent stents, mild aortic stenosis, echo EF 6065% in 2023, HTN, anxiety, chronic pain syndrome, GERD, hypothyroidism, HLD, ILD, CKD 3A presented to the hospital with complaints of leg swelling and shortness of breath. Went to see PCP in 2/27 there was some trace edema noted but no significant shortness of breath.   niece found the patient with very short of breath and therefore patient was brought to the hospital. Currently being treated for acute on chronic HFpEF.  Assessment & Plan:   Principal Problem:   Acute on chronic diastolic (congestive) heart failure (HCC) Active Problems:   Hypertension   Hyperlipidemia   Hypothyroidism   History of  seizures   Depression   CAD in native artery   GERD   IBS   ILD (interstitial lung disease) (HCC)   Mild aortic stenosis   Chronic pain syndrome   Chronic kidney disease, stage 3a (HCC)  Acute hypoxic respiratory failure secondary to acute on chronic diastolic CHF: Upon admission, BNP significantly elevated 1056.  Significant swelling in the leg.  Tripoding requiring BiPAP therapy and NRB in the ER. Chest x-ray shows significant vascular congestion.  There is comment about pneumonia although pneumonia is less likely. Echocardiogram 60 to 65% in September 2023. Last cath 9/23 40% stenosis and RPDA and second diagonal, otherwise nonobstructive CAD with patent stent in RCA. Continue BiPAP as needed.  Cardiology following and managing.  Diuretics were held for few days due to rising creatinine but she was started on Lasix 40 mg IV daily on 03/26/2023.  However in the afternoon that day Around 1:45 PM on 03/26/2023, rapid response was called due to shortness of breath and increasing oxygen demand.  Patient was noted to have crackles.  Chest  x-ray ordered, patient was started on Amio drip.  Patient had already received Lasix in the morning.  I had ordered another Lasix dose.  Followed by this, the nurse called CODE BLUE on this patient around 2:30 PM while the patient was breathing and there was no loss of pulse.  CODE BLUE team arrived at the bedside and found patient sitting in bed, has pulse, able to respond. Placed on BIPAP @ bedside due to desaturation.  BiPAP was removed and she was placed on 10 L salter.  I discussed with the resident who attended the code.  Later on we weaned her to 4 L.  Patient appeared much more comfortable.  This morning, she is on 6 L of oxygen, she for the first time says that she was feeling short of breath.  She has Rales bilaterally.  At this point in time, I think her interstitial lung disease is also a component of her hypoxia so I will try Solu-Medrol 40 mg IV twice daily and started on scheduled Lasix 40 mg IV twice daily as well.  Hopefully cardiology will be okay with that.  AKI on CKD stage IIIb: Baseline creatinine appears to be around 1.3-1.5, peaked at 1.91 but improved to 1.6 on 03/24/2023 and has been stable.  Hypokalemia: Will replenish.  Acute anemia: Almost 3 months ago, patient's hemoglobin was normal.  She presents with 10.6 which gradually dropped to 8.6 but improved to 9.3 without any transfusion.  No reports of hematemesis, melena or hematochezia.  Cardiology concerned about GI bleed  and ordered FOBT which was negative.    History of SVT but here with paroxysmal atrial fibrillation with RVR. Patient has history of SVT and is on Cardizem PTA. Cardizem was on hold on admission to allow room for diuresis. Found to have A-fib with RVR early in the morning of 3/10.  Cardiology managing, currently on amiodarone drip and Eliquis.   Acute urinary retention. Was unable to urinate on her own with bladder scan showing 400+ cc.  Placed Foley catheter which was eventually discontinued on 03/25/2023 and  patient has had many successful voiding trials.   Possible cellulitis. Patient was presumed to have cellulitis of bilateral legs based on appearance.  She was started on cefazolin.  On my examination, I did not see any erythema, no warmth or tenderness.  Looks like antibiotics were started on 03/20/2023, will continue for total of 5 to 7 days.   History of CAD and mild aortic stenosis/elevated troponin. Last 2023.  Nonobstructive CAD with patent stent.Troponins minimally elevated but EKG does not show any evidence of acute ischemia.  Echo shows preserved EF.  No significant wall abnormality.  Likely demand ischemia.  Hypertension: Blood pressure on the low side.  All antihypertensives on hold.   Acute metabolic encephalopathy. Likely in the setting of hypotension. Per previous hospitalist documentation  "blood pressure corrected with midodrine with improvement in mentation".  However I did not see any midodrine ordered.  Patient is alert and oriented, needs some prompts though.   Poor venous access. RN requested a PICC line.  Placed.    GERD. Continue PPI.   Hypothyroidism. Continuing Synthroid.  Both TSH and free T4 elevated.  I reduced Synthroid from 88 mcg to 75 mcg starting 03/24/2023.  Repeat TSH in 6 to 8 weeks.   History of seizures. Continuing Keppra.  Switching to oral.   Depression. On Cymbalta, will resume tomorrow.   HLD. Continuing statin.   Goals of care conversation. Previous hospitalist discussed with the patient as well as in presence of niece.  Patient wanted to be DNR however per documentation/CODE STATUS order, she would like to be intubated if needed.  Patient herself did not appear to have the understanding of CODE STATUS at the moment so I had a lengthy discussion with the patient's [facility] POA/niece [name] and she clarified that there may have been some misunderstanding but patient is supposed to be full DNR with no intubation and no CPR.  I changed the  CODE STATUS after talking to her.  We then talked in length about patient's poor prognosis and that I am concerned that patient may not do well during this hospitalization.  The niece was very much understanding of this as patient's husband went through hospitalization and eventually transition to comfort care a year ago or so.  She was very much accepting of that.  She infact and that patient looked so bad last Sunday that she thought patient would likely be comfort care soon.  I did inform her about what we are trying to do to help her feel better but I remain concerned that we may end up choosing comfort care for her if all the measures that we are doing will not work.  I have also consulted palliative care to have further discussion with her.   Prolonged QTc. Minimize QT prolonging medications.  At risk of further worsening in the setting of amiodarone use.  Monitor and EKG and telemetry.   Late entry/addendum:  DVT prophylaxis: apixaban (ELIQUIS) tablet 2.5  mg Start: 03/25/23 1045Heparin   Code Status: Do not attempt resuscitation (DNR) PRE-ARREST INTERVENTIONS DESIRED  Family Communication:  None present at bedside.  Plan of care discussed with patient in length  Status is: Inpatient Remains inpatient appropriate because: Still significantly hypoxic , needs inpatient management.   Estimated body mass index is 20.54 kg/m as calculated from the following:   Height as of this encounter: 5' (1.524 m).   Weight as of this encounter: 47.7 kg.    Nutritional Assessment: Body mass index is 20.54 kg/m.Marland Kitchen Seen by dietician.  I agree with the assessment and plan as outlined below: Nutrition Status:        . Skin Assessment: I have examined the patient's skin and I agree with the wound assessment as performed by the wound care RN as outlined below:    Consultants:  Cardiology  Procedures:  None  Antimicrobials:  Anti-infectives (From admission, onward)    Start     Dose/Rate Route  Frequency Ordered Stop   03/20/23 2200  ceFAZolin (ANCEF) IVPB 1 g/50 mL premix        1 g 100 mL/hr over 30 Minutes Intravenous Every 12 hours 03/20/23 1754 03/23/23 2210         Subjective: Patient seen and examined.  She was complaining of shortness of breath.  Patient continues to have upper body tremors which are chronic and I did confirm that with the knees.  Objective: a Vitals:   03/27/23 0500 03/27/23 0730 03/27/23 0751 03/27/23 0827  BP:    104/79  Pulse:      Resp:    18  Temp:    97.9 F (36.6 C)  TempSrc:    Oral  SpO2:  100% 94%   Weight: 47.7 kg     Height:        Intake/Output Summary (Last 24 hours) at 03/27/2023 1113 Last data filed at 03/27/2023 0929 Gross per 24 hour  Intake 1322.14 ml  Output 2250 ml  Net -927.86 ml   Filed Weights   03/25/23 0302 03/26/23 0318 03/27/23 0500  Weight: 52.1 kg 49.7 kg 47.7 kg    Examination:  General exam: Appears calm and comfortable  Respiratory system: B/l Rales and crackles. Respiratory effort normal. Cardiovascular system: S1 & S2 heard, RRR. No JVD, murmurs, rubs, gallops or clicks. No pedal edema. Gastrointestinal system: Abdomen is nondistended, soft and nontender. No organomegaly or masses felt. Normal bowel sounds heard. Central nervous system: Alert and oriented. No focal neurological deficits. Extremities: Symmetric 5 x 5 power. Skin: No rashes, lesions or ulcers.    Data Reviewed: I have personally reviewed following labs and imaging studies  CBC: Recent Labs  Lab 03/23/23 1700 03/24/23 0545 03/25/23 0500 03/26/23 0500 03/27/23 0530  WBC 10.7* 9.9 10.0 10.4 11.7*  NEUTROABS 8.7* 7.5 7.7  --   --   HGB 9.3* 9.1* 9.0* 9.3* 9.9*  HCT 28.7* 28.2* 29.3* 29.9* 31.1*  MCV 91.7 92.5 94.2 93.1 91.5  PLT 313 294 302 337 385   Basic Metabolic Panel: Recent Labs  Lab 03/21/23 0830 03/22/23 0447 03/23/23 0405 03/24/23 0545 03/25/23 0500 03/26/23 0500 03/27/23 0530  NA  --  136 138 137 137  138 140  K  --  4.1 3.3* 3.4* 4.4 3.4* 3.7  CL  --  106 104 105 108 103 100  CO2  --  19* 21* 24 21* 26 28  GLUCOSE  --  105* 161* 165* 162* 118* 120*  BUN  --  54* 56* 46* 37* 32* 30*  CREATININE  --  1.77* 1.91* 1.59* 1.67* 1.58* 1.60*  CALCIUM  --  6.0* 6.4* 6.8* 7.0* 7.0* 7.1*  MG 1.0* 2.5* 2.3 2.1  --   --   --   PHOS  --  8.6* 5.7* 3.9  --   --   --    GFR: Estimated Creatinine Clearance: 21.5 mL/min (A) (by C-G formula based on SCr of 1.6 mg/dL (H)). Liver Function Tests: Recent Labs  Lab 03/21/23 0407 03/22/23 0447 03/23/23 0405 03/24/23 0545  AST 40  --   --   --   ALT 26  --   --   --   ALKPHOS 72  --   --   --   BILITOT 1.5*  --   --   --   PROT 5.7*  --   --   --   ALBUMIN 2.7* 2.7* 2.4* 2.3*   No results for input(s): "LIPASE", "AMYLASE" in the last 168 hours. No results for input(s): "AMMONIA" in the last 168 hours. Coagulation Profile: No results for input(s): "INR", "PROTIME" in the last 168 hours. Cardiac Enzymes: No results for input(s): "CKTOTAL", "CKMB", "CKMBINDEX", "TROPONINI" in the last 168 hours. BNP (last 3 results) No results for input(s): "PROBNP" in the last 8760 hours. HbA1C: No results for input(s): "HGBA1C" in the last 72 hours. CBG: Recent Labs  Lab 03/21/23 2241  GLUCAP 115*   Lipid Profile: No results for input(s): "CHOL", "HDL", "LDLCALC", "TRIG", "CHOLHDL", "LDLDIRECT" in the last 72 hours. Thyroid Function Tests: No results for input(s): "TSH", "T4TOTAL", "FREET4", "T3FREE", "THYROIDAB" in the last 72 hours.  Anemia Panel: Recent Labs    03/25/23 0933  VITAMINB12 417  FOLATE 12.0  FERRITIN 302  TIBC 213*  IRON 14*  RETICCTPCT 2.6   Sepsis Labs: Recent Labs  Lab 03/21/23 0830 03/25/23 0933 03/27/23 0530  PROCALCITON 0.72 0.33 0.27    Recent Results (from the past 240 hours)  Resp panel by RT-PCR (RSV, Flu A&B, Covid) Anterior Nasal Swab     Status: None   Collection Time: 03/20/23  2:00 PM   Specimen: Anterior  Nasal Swab  Result Value Ref Range Status   SARS Coronavirus 2 by RT PCR NEGATIVE NEGATIVE Final   Influenza A by PCR NEGATIVE NEGATIVE Final   Influenza B by PCR NEGATIVE NEGATIVE Final    Comment: (NOTE) The Xpert Xpress SARS-CoV-2/FLU/RSV plus assay is intended as an aid in the diagnosis of influenza from Nasopharyngeal swab specimens and should not be used as a sole basis for treatment. Nasal washings and aspirates are unacceptable for Xpert Xpress SARS-CoV-2/FLU/RSV testing.  Fact Sheet for Patients: BloggerCourse.com  Fact Sheet for Healthcare Providers: SeriousBroker.it  This test is not yet approved or cleared by the [place] FDA and has been authorized for detection and/or diagnosis of SARS-CoV-2 by FDA under an Emergency Use Authorization (EUA). This EUA will remain in effect (meaning this test can be used) for the duration of the COVID-19 declaration under Section 564(b)(1) of the Act, 21 U.S.C. section 360bbb-3(b)(1), unless the authorization is terminated or revoked.     Resp Syncytial Virus by PCR NEGATIVE NEGATIVE Final    Comment: (NOTE) Fact Sheet for Patients: BloggerCourse.com  Fact Sheet for Healthcare Providers: SeriousBroker.it  This test is not yet approved or cleared by the [place] FDA and has been authorized for detection and/or diagnosis of SARS-CoV-2 by FDA under an Emergency Use Authorization (EUA). This EUA will remain  in effect (meaning this test can be used) for the duration of the COVID-19 declaration under Section 564(b)(1) of the Act, 21 U.S.C. section 360bbb-3(b)(1), unless the authorization is terminated or revoked.  Performed at [facility], [address] [place], [place] [address]   MRSA Next Gen by PCR, Nasal     Status: None   Collection Time: 03/25/23 11:29 PM   Specimen: Nasal Mucosa; Nasal Swab  Result Value  Ref Range Status   MRSA by PCR Next Gen NOT DETECTED NOT DETECTED Final    Comment: (NOTE) The GeneXpert MRSA Assay (FDA approved for NASAL specimens only), is one component of a comprehensive MRSA colonization surveillance program. It is not intended to diagnose MRSA infection nor to guide or monitor treatment for MRSA infections. Test performance is not FDA approved in patients less than 19 years old. Performed at [facility], [address], [place], [place] [address]      Radiology Studies: DG Chest Port 1 View Result Date: 03/26/2023 CLINICAL DATA:  Difficulty breathing, acute respiratory distress EXAM: PORTABLE CHEST 1 VIEW COMPARISON:  03/22/2023, 03/20/2023 FINDINGS: 2 frontal views of the chest demonstrate right-sided PICC tip overlying atriocaval junction. Cardiac silhouette is stable, with dense calcification of the mitral annulus again noted. Postsurgical changes are seen from prior partial right lower lobe resection. Bilateral upper lobe predominant airspace disease again identified, without appreciable change allowing for differences in positioning and technique. Small bilateral effusions. No pneumothorax. No acute bony abnormalities. IMPRESSION: 1. Upper lobe predominant bilateral airspace disease, not significantly changed since 03/22/2023 but slightly progressed since 03/20/2023. Findings could reflect worsening infection or edema. 2. Small bilateral pleural effusions. Electronically Signed   By: Sharlet Salina M.D.   On: 03/26/2023 14:53     Scheduled Meds:  apixaban  2.5 mg Oral BID   atorvastatin  80 mg Oral Daily   Chlorhexidine Gluconate Cloth  6 each Topical Daily   feeding supplement  237 mL Oral BID BM   furosemide  40 mg Intravenous BID   levETIRAcetam  500 mg Oral BID   levothyroxine  75 mcg Oral Q0600   methylPREDNISolone (SOLU-MEDROL) injection  40 mg Intravenous Q12H   pantoprazole  40 mg Oral BID   polyethylene glycol  17 g Oral Daily    senna-docusate  1 tablet Oral BID   sodium chloride flush  10-40 mL Intracatheter Q12H   Continuous Infusions:  amiodarone 30 mg/hr (03/27/23 0657)     LOS: 7 days   Hughie Closs, MD [facility]  Total time spent: 50 minutes 03/27/2023, 11:13 AM   *Please note that this is a verbal dictation therefore any spelling or grammatical errors are due to the "Dragon Medical One" system interpretation.  Please page via Amion and do not message via secure chat for urgent patient care matters. Secure chat can be used for non urgent patient care matters.  How to contact the [facility] Attending or Consulting provider 7A - 7P or covering provider during after hours 7P -7A, for this patient?  Check the care team in [facility] and look for a) attending/consulting [facility] provider listed and b) the [facility] team listed. Page or secure chat 7A-7P. Log into [email or web address] and use [facility]'s universal password to access. If you do not have the password, please contact the hospital operator. Locate the [facility] provider you are looking for under [facility] and page to a number that you can be directly reached. If you still have difficulty reaching  the provider, please page the [facility] (Director on Call) for the Hospitalists listed on amion for assistance.

## 2023-03-28 DIAGNOSIS — I480 Paroxysmal atrial fibrillation: Secondary | ICD-10-CM

## 2023-03-28 DIAGNOSIS — I5033 Acute on chronic diastolic (congestive) heart failure: Secondary | ICD-10-CM | POA: Diagnosis not present

## 2023-03-28 DIAGNOSIS — I4819 Other persistent atrial fibrillation: Secondary | ICD-10-CM | POA: Diagnosis not present

## 2023-03-28 LAB — TROPONIN I (HIGH SENSITIVITY)
Troponin I (High Sensitivity): 16 ng/L (ref <18)
Troponin I (High Sensitivity): 18 ng/L — ABNORMAL HIGH (ref <18)

## 2023-03-28 LAB — BASIC METABOLIC PANEL
Anion gap: 17 — ABNORMAL HIGH (ref 5–15)
BUN: 34 mg/dL — ABNORMAL HIGH (ref 8–23)
CO2: 29 mmol/L (ref 22–32)
Calcium: 6.6 mg/dL — ABNORMAL LOW (ref 8.9–10.3)
Chloride: 91 mmol/L — ABNORMAL LOW (ref 98–111)
Creatinine, Ser: 1.67 mg/dL — ABNORMAL HIGH (ref 0.44–1.00)
GFR, Estimated: 32 mL/min — ABNORMAL LOW (ref >60)
Glucose, Bld: 167 mg/dL — ABNORMAL HIGH (ref 70–99)
Potassium: 3.2 mmol/L — ABNORMAL LOW (ref 3.5–5.1)
Sodium: 137 mmol/L (ref 135–145)

## 2023-03-28 LAB — CBC
HCT: 31.1 % — ABNORMAL LOW (ref 36.0–46.0)
Hemoglobin: 10.3 g/dL — ABNORMAL LOW (ref 12.0–15.0)
MCH: 29.8 pg (ref 26.0–34.0)
MCHC: 33.1 g/dL (ref 30.0–36.0)
MCV: 89.9 fL (ref 80.0–100.0)
Platelets: 398 10*3/uL (ref 150–400)
RBC: 3.46 MIL/uL — ABNORMAL LOW (ref 3.87–5.11)
RDW: 13.7 % (ref 11.5–15.5)
WBC: 9.7 10*3/uL (ref 4.0–10.5)
nRBC: 0 % (ref 0.0–0.2)

## 2023-03-28 LAB — GLUCOSE, CAPILLARY
Glucose-Capillary: 142 mg/dL — ABNORMAL HIGH (ref 70–99)
Glucose-Capillary: 93 mg/dL (ref 70–99)
Glucose-Capillary: 112 mg/dL — ABNORMAL HIGH (ref 70–99)

## 2023-03-28 LAB — MAGNESIUM: Magnesium: 1.1 mg/dL — ABNORMAL LOW (ref 1.7–2.4)

## 2023-03-28 MED ORDER — DIGOXIN 125 MCG PO TABS
0.2500 mg | ORAL_TABLET | Freq: Every day | ORAL | Status: DC
  Administered 2023-03-28: 0.25 mg via ORAL
  Filled 2023-03-28: qty 2

## 2023-03-28 MED ORDER — CARVEDILOL 3.125 MG PO TABS
3.1250 mg | ORAL_TABLET | Freq: Two times a day (BID) | ORAL | Status: DC
  Administered 2023-03-28 – 2023-03-30 (×5): 3.125 mg via ORAL
  Filled 2023-03-28 (×5): qty 1

## 2023-03-28 MED ORDER — MAGNESIUM SULFATE 4 GM/100ML IV SOLN
4.0000 g | Freq: Once | INTRAVENOUS | Status: AC
  Administered 2023-03-28: 4 g via INTRAVENOUS
  Filled 2023-03-28: qty 100

## 2023-03-28 MED ORDER — NITROGLYCERIN 0.4 MG SL SUBL
0.4000 mg | SUBLINGUAL_TABLET | SUBLINGUAL | Status: DC | PRN
  Administered 2023-03-28: 0.4 mg via SUBLINGUAL
  Filled 2023-03-28: qty 1

## 2023-03-28 MED ORDER — POTASSIUM CHLORIDE CRYS ER 20 MEQ PO TBCR
40.0000 meq | EXTENDED_RELEASE_TABLET | ORAL | Status: AC
  Administered 2023-03-28 (×2): 40 meq via ORAL
  Filled 2023-03-28 (×2): qty 2

## 2023-03-28 NOTE — Progress Notes (Signed)
 Progress Note  Patient Name: [name] Date of Encounter: 03/28/2023  Primary Cardiologist: Armanda Magic, MD   Subjective   No complaints has picture of her dog [name] at bedside  Inpatient Medications    Scheduled Meds:  apixaban  2.5 mg Oral BID   atorvastatin  80 mg Oral Daily   Chlorhexidine Gluconate Cloth  6 each Topical Daily   feeding supplement  237 mL Oral BID BM   furosemide  40 mg Intravenous BID   levETIRAcetam  500 mg Oral BID   levothyroxine  75 mcg Oral Q0600   methylPREDNISolone (SOLU-MEDROL) injection  40 mg Intravenous Q12H   pantoprazole  40 mg Oral BID   polyethylene glycol  17 g Oral Daily   senna-docusate  1 tablet Oral BID   sodium chloride flush  10-40 mL Intracatheter Q12H   Continuous Infusions:  amiodarone 30 mg/hr (03/28/23 0043)   PRN Meds: acetaminophen **OR** acetaminophen, ALPRAZolam, HYDROcodone-acetaminophen, levalbuterol, loperamide, metoprolol tartrate, morphine injection, ondansetron **OR** ondansetron (ZOFRAN) IV, mouth rinse, sodium chloride flush   Vital Signs    Vitals:   03/27/23 2355 03/28/23 0302 03/28/23 0753 03/28/23 0813  BP: 135/85 (!) 120/94 95/69   Pulse: (!) 121 (!) 124 (!) 115 (!) 123  Resp: 18 20 20    Temp: 98.4 F (36.9 C) 98.6 F (37 C) 97.9 F (36.6 C)   TempSrc: Oral Oral Oral   SpO2: 100% 100% 99% 99%  Weight:  44.8 kg    Height:        Intake/Output Summary (Last 24 hours) at 03/28/2023 0903 Last data filed at 03/28/2023 0809 Gross per 24 hour  Intake 595.78 ml  Output 975 ml  Net -379.22 ml   Filed Weights   03/26/23 0318 03/27/23 0500 03/28/23 0302  Weight: 49.7 kg 47.7 kg 44.8 kg    Telemetry    Sinus rhythm  - Personally Reviewed  ECG     - Personally Reviewed  Physical Exam    Elderly female Tremor in lower jaw Decreased BS base AS murmur  Prior sternotimy  Legs wrapped   Labs    Chemistry Recent Labs  Lab 03/22/23 0447 03/23/23 0405 03/24/23 0545 03/25/23 0500  03/26/23 0500 03/27/23 0530  NA 136 138 137 137 138 140  K 4.1 3.3* 3.4* 4.4 3.4* 3.7  CL 106 104 105 108 103 100  CO2 19* 21* 24 21* 26 28  GLUCOSE 105* 161* 165* 162* 118* 120*  BUN 54* 56* 46* 37* 32* 30*  CREATININE 1.77* 1.91* 1.59* 1.67* 1.58* 1.60*  CALCIUM 6.0* 6.4* 6.8* 7.0* 7.0* 7.1*  ALBUMIN 2.7* 2.4* 2.3*  --   --   --   GFRNONAA 29* 27* 33* 32* 34* 33*  ANIONGAP 11 13 8 8 9 12      Hematology Recent Labs  Lab 03/26/23 0500 03/27/23 0530 03/28/23 0430  WBC 10.4 11.7* 9.7  RBC 3.21* 3.40* 3.46*  HGB 9.3* 9.9* 10.3*  HCT 29.9* 31.1* 31.1*  MCV 93.1 91.5 89.9  MCH 29.0 29.1 29.8  MCHC 31.1 31.8 33.1  RDW 13.9 13.9 13.7  PLT 337 385 398    Cardiac EnzymesNo results for input(s): "TROPONINI" in the last 168 hours. No results for input(s): "TROPIPOC" in the last 168 hours.   BNP Recent Labs  Lab 03/25/23 0933 03/27/23 0530  BNP 1,201.7* 876.3*     DDimer No results for input(s): "DDIMER" in the last 168 hours.   Radiology    DG Chest  Port 1 View Result Date: 03/26/2023 CLINICAL DATA:  Difficulty breathing, acute respiratory distress EXAM: PORTABLE CHEST 1 VIEW COMPARISON:  03/22/2023, 03/20/2023 FINDINGS: 2 frontal views of the chest demonstrate right-sided PICC tip overlying atriocaval junction. Cardiac silhouette is stable, with dense calcification of the mitral annulus again noted. Postsurgical changes are seen from prior partial right lower lobe resection. Bilateral upper lobe predominant airspace disease again identified, without appreciable change allowing for differences in positioning and technique. Small bilateral effusions. No pneumothorax. No acute bony abnormalities. IMPRESSION: 1. Upper lobe predominant bilateral airspace disease, not significantly changed since 03/22/2023 but slightly progressed since 03/20/2023. Findings could reflect worsening infection or edema. 2. Small bilateral pleural effusions. Electronically Signed   By: Sharlet Salina M.D.    On: 03/26/2023 14:53    Cardiac Studies   Echo 03/21/23 EF 60-65% mild MR moderate AS mean gradient 8 AVA 1.1 cm2 low flow low gradient AS   Patient Profile     78 y.o. female  a hx of HFpEF, CAD s/p multiple interventions (DES to RCA in 2011 for MI, DES to RCA on 06/2010, DES to LAD in 2014), hypertension, hyperlipidemia, aortic stenosis, esophageal strictures s/p dilation, GERD, hypothyroidism, renal insufficiency, 2017 VATS with chronic bronchiolitis without ILD, DVT in 2019, pericardial cyst s/p resection who is admitted for acute on chronic HFpEF, new onset A. Fib.   Assessment & Plan    Acute on chronic HFpEF with new RV dysfunction  Mild to moderate Tricuspid regurgitation Moderate aortic stenosis ( moderate low flow/low gradient )  New onset atrial fibrillation with rvr  CAD s/p PCI AKI on CKD 3b Hypertension   Afib with rates elevated still needs low dose oral beta blocker and can add digoxin given RV decrease in function Continue low dose eliquis She is not a candidate for [facility] with comfort care/DNR      For questions or updates, please contact [facility] Please consult [email or web address] for contact info under Cardiology/STEMI.      Signed, Charlton Haws, MD  03/28/2023, 9:03 AM

## 2023-03-28 NOTE — Progress Notes (Addendum)
 PROGRESS NOTE    [name]  ZDG:[id number] DOB: 1945/08/30 DOA: 03/20/2023 PCP: Merri Brunette, MD   Brief Narrative:  Patient with PMH of CAD with PCI to RCA, recent cath 9/23 with nonobstructive CAD and patent stents, mild aortic stenosis, echo EF 6065% in 2023, HTN, anxiety, chronic pain syndrome, GERD, hypothyroidism, HLD, ILD, CKD 3A presented to the hospital with complaints of leg swelling and shortness of breath. Went to see PCP in 2/27 there was some trace edema noted but no significant shortness of breath.   niece found the patient with very short of breath and therefore patient was brought to the hospital. Currently being treated for acute on chronic HFpEF.  Assessment & Plan:   Principal Problem:   Acute on chronic diastolic (congestive) heart failure (HCC) Active Problems:   Hypertension   Hyperlipidemia   Hypothyroidism   History of  seizures   Depression   CAD in native artery   GERD   IBS   ILD (interstitial lung disease) (HCC)   Mild aortic stenosis   Chronic pain syndrome   Chronic kidney disease, stage 3a (HCC)  Acute hypoxic respiratory failure secondary to acute on chronic diastolic CHF: Upon admission, BNP significantly elevated 1056.  Significant swelling in the leg.  Tripoding requiring BiPAP therapy and NRB in the ER. Chest x-ray shows significant vascular congestion.  There is comment about pneumonia although pneumonia is less likely. Echocardiogram 60 to 65% in September 2023. Last cath 9/23 40% stenosis and RPDA and second diagonal, otherwise nonobstructive CAD with patent stent in RCA. Continue BiPAP as needed.  Cardiology following and managing.  Diuretics were held for few days due to rising creatinine but she was started on Lasix 40 mg IV daily on 03/26/2023.  However in the afternoon that day Around 1:45 PM on 03/26/2023, rapid response was called due to shortness of breath and increasing oxygen demand.  Patient was noted to have crackles.  Chest  x-ray ordered, patient was started on Amio drip.  Patient had already received Lasix in the morning.  I had ordered another Lasix dose.  Followed by this, the nurse called CODE BLUE on this patient around 2:30 PM while the patient was breathing and there was no loss of pulse.  CODE BLUE team arrived at the bedside and found patient sitting in bed, has pulse, able to respond. She on BIPAP @ bedside due to desaturation.  BiPAP was removed and she was placed on 10 L salter.  Due to continued high amount of oxygen requirement and rails, interstitial lung disease/pneumonitis was considered to be one of the cause for her current condition so I started her on Solu-Medrol on 03/27/2023.  Interestingly, patient says that she feels better and on exam, heart rate also improving as well.  I will continue Solu-Medrol and Lasix.   AKI on CKD stage IIIb: Baseline creatinine appears to be around 1.3-1.5, peaked at 1.91 but improved to 1.6 on 03/24/2023 and has been stable.  Hypokalemia: Low again, Will replenish.  Acute anemia: Almost 3 months ago, patient's hemoglobin was normal.  She presents with 10.6 which gradually dropped to 8.6 but improved to 9.3 without any transfusion.  No reports of hematemesis, melena or hematochezia.  Cardiology concerned about GI bleed and ordered FOBT which was negative.    History of SVT but here with paroxysmal atrial fibrillation with RVR. Patient has history of SVT and is on Cardizem PTA. Cardizem was on hold on admission to allow room for diuresis.  Found to have A-fib with RVR early in the morning of 3/10.  Cardiology managing, currently on amiodarone drip and Eliquis.   Acute urinary retention. Was unable to urinate on her own with bladder scan showing 400+ cc.  Placed Foley catheter which was eventually discontinued on 03/25/2023 and patient has had many successful voiding trials.   Possible cellulitis. Patient was presumed to have cellulitis of bilateral legs based on appearance.   She was started on cefazolin.  On my examination, I did not see any erythema, no warmth or tenderness.  Looks like antibiotics were started on 03/20/2023, will continue for total of 5 to 7 days.   History of CAD and mild aortic stenosis/elevated troponin. Last 2023.  Nonobstructive CAD with patent stent.Troponins minimally elevated but EKG does not show any evidence of acute ischemia.  Echo shows preserved EF.  No significant wall abnormality.  Likely demand ischemia.  Hypertension: Blood pressure on the low side.  All antihypertensives on hold.   Acute metabolic encephalopathy. Likely in the setting of hypotension. Per previous hospitalist documentation  "blood pressure corrected with midodrine with improvement in mentation".  However I did not see any midodrine ordered.  Patient is alert and oriented, needs some prompts though.   Poor venous access. RN requested a PICC line.  Placed.    GERD. Continue PPI.   Hypothyroidism. Continuing Synthroid.  Both TSH and free T4 elevated.  I reduced Synthroid from 88 mcg to 75 mcg starting 03/24/2023.  Repeat TSH in 6 to 8 weeks.   History of seizures. Continuing Keppra.  Switching to oral.   Depression. On Cymbalta, will resume tomorrow.   HLD. Continuing statin.   Goals of care conversation. Previous hospitalist discussed with the patient as well as in presence of niece.  Patient wanted to be DNR however per documentation/CODE STATUS order, she would like to be intubated if needed.  Patient herself did not appear to have the understanding of CODE STATUS at the moment so on 03/27/2023 I had a lengthy discussion with the patient's [facility] POA/niece [name] and she clarified that there may have been some misunderstanding but patient is supposed to be full DNR with no intubation and no CPR.  I changed the CODE STATUS after talking to her.  We then talked in length about patient's poor prognosis and that I am concerned that patient may not do well  during this hospitalization.  The niece was very much understanding of this as patient's husband went through hospitalization and eventually transition to comfort care a year ago or so.  She was very much accepting of that.  She infact said that patient looked so bad last Sunday that she thought patient would likely be comfort care soon.  I did inform her about what we are trying to do to help her feel better but I remain concerned that we may end up choosing comfort care for her if all the measures that we are doing will not work.  Palliative care consulted.  Awaiting evaluation.   Prolonged QTc. Minimize QT prolonging medications.  At risk of further worsening in the setting of amiodarone use.  Monitor and EKG and telemetry.   DVT prophylaxis: apixaban (ELIQUIS) tablet 2.5 mg Start: 03/25/23 1045Heparin   Code Status: Limited: Do not attempt resuscitation (DNR) -DNR-LIMITED -Do Not Intubate/DNI   Family Communication:  None present at bedside.   Status is: Inpatient Remains inpatient appropriate because: Still significantly hypoxic , needs inpatient management.   Estimated body mass  index is 19.29 kg/m as calculated from the following:   Height as of this encounter: 5' (1.524 m).   Weight as of this encounter: 44.8 kg.    Nutritional Assessment: Body mass index is 19.29 kg/m.Marland Kitchen Seen by dietician.  I agree with the assessment and plan as outlined below: Nutrition Status:        . Skin Assessment: I have examined the patient's skin and I agree with the wound assessment as performed by the wound care RN as outlined below:    Consultants:  Cardiology  Procedures:  None  Antimicrobials:  Anti-infectives (From admission, onward)    Start     Dose/Rate Route Frequency Ordered Stop   03/20/23 2200  ceFAZolin (ANCEF) IVPB 1 g/50 mL premix        1 g 100 mL/hr over 30 Minutes Intravenous Every 12 hours 03/20/23 1754 03/23/23 2210         Subjective: Patient seen and  examined.  She says that her breathing is better than yesterday.  She has no other complaint.  Continues to have upper body tremors.  Objective: a Vitals:   03/27/23 2355 03/28/23 0302 03/28/23 0753 03/28/23 0813  BP: 135/85 (!) 120/94 95/69   Pulse: (!) 121 (!) 124 (!) 115 (!) 123  Resp: 18 20 20    Temp: 98.4 F (36.9 C) 98.6 F (37 C) 97.9 F (36.6 C)   TempSrc: Oral Oral Oral   SpO2: 100% 100% 99% 99%  Weight:  44.8 kg    Height:        Intake/Output Summary (Last 24 hours) at 03/28/2023 0924 Last data filed at 03/28/2023 0809 Gross per 24 hour  Intake 595.78 ml  Output 975 ml  Net -379.22 ml   Filed Weights   03/26/23 0318 03/27/23 0500 03/28/23 0302  Weight: 49.7 kg 47.7 kg 44.8 kg    Examination:  General exam: Appears calm and comfortable  Respiratory system: Bilateral crackles/Rales. Respiratory effort normal. Cardiovascular system: S1 & S2 heard, RRR. No JVD, murmurs, rubs, gallops or clicks. No pedal edema. Gastrointestinal system: Abdomen is nondistended, soft and nontender. No organomegaly or masses felt. Normal bowel sounds heard. Central nervous system: Alert and oriented. No focal neurological deficits. Extremities: Symmetric 5 x 5 power. Skin: No rashes, lesions or ulcers.    Data Reviewed: I have personally reviewed following labs and imaging studies  CBC: Recent Labs  Lab 03/23/23 1700 03/24/23 0545 03/25/23 0500 03/26/23 0500 03/27/23 0530 03/28/23 0430  WBC 10.7* 9.9 10.0 10.4 11.7* 9.7  NEUTROABS 8.7* 7.5 7.7  --   --   --   HGB 9.3* 9.1* 9.0* 9.3* 9.9* 10.3*  HCT 28.7* 28.2* 29.3* 29.9* 31.1* 31.1*  MCV 91.7 92.5 94.2 93.1 91.5 89.9  PLT 313 294 302 337 385 398   Basic Metabolic Panel: Recent Labs  Lab 03/22/23 0447 03/23/23 0405 03/24/23 0545 03/25/23 0500 03/26/23 0500 03/27/23 0530  NA 136 138 137 137 138 140  K 4.1 3.3* 3.4* 4.4 3.4* 3.7  CL 106 104 105 108 103 100  CO2 19* 21* 24 21* 26 28  GLUCOSE 105* 161* 165* 162*  118* 120*  BUN 54* 56* 46* 37* 32* 30*  CREATININE 1.77* 1.91* 1.59* 1.67* 1.58* 1.60*  CALCIUM 6.0* 6.4* 6.8* 7.0* 7.0* 7.1*  MG 2.5* 2.3 2.1  --   --   --   PHOS 8.6* 5.7* 3.9  --   --   --    GFR: Estimated Creatinine Clearance:  21.2 mL/min (A) (by C-G formula based on SCr of 1.6 mg/dL (H)). Liver Function Tests: Recent Labs  Lab 03/22/23 0447 03/23/23 0405 03/24/23 0545  ALBUMIN 2.7* 2.4* 2.3*   No results for input(s): "LIPASE", "AMYLASE" in the last 168 hours. No results for input(s): "AMMONIA" in the last 168 hours. Coagulation Profile: No results for input(s): "INR", "PROTIME" in the last 168 hours. Cardiac Enzymes: No results for input(s): "CKTOTAL", "CKMB", "CKMBINDEX", "TROPONINI" in the last 168 hours. BNP (last 3 results) No results for input(s): "PROBNP" in the last 8760 hours. HbA1C: No results for input(s): "HGBA1C" in the last 72 hours. CBG: Recent Labs  Lab 03/21/23 2241 03/28/23 0750  GLUCAP 115* 142*   Lipid Profile: No results for input(s): "CHOL", "HDL", "LDLCALC", "TRIG", "CHOLHDL", "LDLDIRECT" in the last 72 hours. Thyroid Function Tests: No results for input(s): "TSH", "T4TOTAL", "FREET4", "T3FREE", "THYROIDAB" in the last 72 hours.  Anemia Panel: Recent Labs    03/25/23 0933  VITAMINB12 417  FOLATE 12.0  FERRITIN 302  TIBC 213*  IRON 14*  RETICCTPCT 2.6   Sepsis Labs: Recent Labs  Lab 03/25/23 0933 03/27/23 0530  PROCALCITON 0.33 0.27    Recent Results (from the past 240 hours)  Resp panel by RT-PCR (RSV, Flu A&B, Covid) Anterior Nasal Swab     Status: None   Collection Time: 03/20/23  2:00 PM   Specimen: Anterior Nasal Swab  Result Value Ref Range Status   SARS Coronavirus 2 by RT PCR NEGATIVE NEGATIVE Final   Influenza A by PCR NEGATIVE NEGATIVE Final   Influenza B by PCR NEGATIVE NEGATIVE Final    Comment: (NOTE) The Xpert Xpress SARS-CoV-2/FLU/RSV plus assay is intended as an aid in the diagnosis of influenza from  Nasopharyngeal swab specimens and should not be used as a sole basis for treatment. Nasal washings and aspirates are unacceptable for Xpert Xpress SARS-CoV-2/FLU/RSV testing.  Fact Sheet for Patients: BloggerCourse.com  Fact Sheet for Healthcare Providers: SeriousBroker.it  This test is not yet approved or cleared by the [place] FDA and has been authorized for detection and/or diagnosis of SARS-CoV-2 by FDA under an Emergency Use Authorization (EUA). This EUA will remain in effect (meaning this test can be used) for the duration of the COVID-19 declaration under Section 564(b)(1) of the Act, 21 U.S.C. section 360bbb-3(b)(1), unless the authorization is terminated or revoked.     Resp Syncytial Virus by PCR NEGATIVE NEGATIVE Final    Comment: (NOTE) Fact Sheet for Patients: BloggerCourse.com  Fact Sheet for Healthcare Providers: SeriousBroker.it  This test is not yet approved or cleared by the [place] FDA and has been authorized for detection and/or diagnosis of SARS-CoV-2 by FDA under an Emergency Use Authorization (EUA). This EUA will remain in effect (meaning this test can be used) for the duration of the COVID-19 declaration under Section 564(b)(1) of the Act, 21 U.S.C. section 360bbb-3(b)(1), unless the authorization is terminated or revoked.  Performed at [facility], [address] [place], [place] [address]   MRSA Next Gen by PCR, Nasal     Status: None   Collection Time: 03/25/23 11:29 PM   Specimen: Nasal Mucosa; Nasal Swab  Result Value Ref Range Status   MRSA by PCR Next Gen NOT DETECTED NOT DETECTED Final    Comment: (NOTE) The GeneXpert MRSA Assay (FDA approved for NASAL specimens only), is one component of a comprehensive MRSA colonization surveillance program. It is not intended to diagnose MRSA infection nor to guide or  monitor treatment  for MRSA infections. Test performance is not FDA approved in patients less than 60 years old. Performed at [facility], [address], [place], [place] [address]      Radiology Studies: DG Chest Port 1 View Result Date: 03/26/2023 CLINICAL DATA:  Difficulty breathing, acute respiratory distress EXAM: PORTABLE CHEST 1 VIEW COMPARISON:  03/22/2023, 03/20/2023 FINDINGS: 2 frontal views of the chest demonstrate right-sided PICC tip overlying atriocaval junction. Cardiac silhouette is stable, with dense calcification of the mitral annulus again noted. Postsurgical changes are seen from prior partial right lower lobe resection. Bilateral upper lobe predominant airspace disease again identified, without appreciable change allowing for differences in positioning and technique. Small bilateral effusions. No pneumothorax. No acute bony abnormalities. IMPRESSION: 1. Upper lobe predominant bilateral airspace disease, not significantly changed since 03/22/2023 but slightly progressed since 03/20/2023. Findings could reflect worsening infection or edema. 2. Small bilateral pleural effusions. Electronically Signed   By: Sharlet Salina M.D.   On: 03/26/2023 14:53     Scheduled Meds:  apixaban  2.5 mg Oral BID   atorvastatin  80 mg Oral Daily   carvedilol  3.125 mg Oral BID WC   Chlorhexidine Gluconate Cloth  6 each Topical Daily   digoxin  0.25 mg Oral Daily   feeding supplement  237 mL Oral BID BM   furosemide  40 mg Intravenous BID   levETIRAcetam  500 mg Oral BID   levothyroxine  75 mcg Oral Q0600   methylPREDNISolone (SOLU-MEDROL) injection  40 mg Intravenous Q12H   pantoprazole  40 mg Oral BID   polyethylene glycol  17 g Oral Daily   senna-docusate  1 tablet Oral BID   sodium chloride flush  10-40 mL Intracatheter Q12H   Continuous Infusions:  amiodarone 30 mg/hr (03/28/23 0043)     LOS: 8 days   Hughie Closs, MD [facility]  Total time spent: 50 minutes 03/28/2023, 9:24 AM    *Please note that this is a verbal dictation therefore any spelling or grammatical errors are due to the "Dragon Medical One" system interpretation.  Please page via Amion and do not message via secure chat for urgent patient care matters. Secure chat can be used for non urgent patient care matters.  How to contact the [facility] Attending or Consulting provider 7A - 7P or covering provider during after hours 7P -7A, for this patient?  Check the care team in [facility] and look for a) attending/consulting TRH provider listed and b) the [facility] team listed. Page or secure chat 7A-7P. Log into [email or web address] and use [place]'s universal password to access. If you do not have the password, please contact the hospital operator. Locate the [facility] provider you are looking for under [facility] and page to a number that you can be directly reached. If you still have difficulty reaching the provider, please page the [facility] (Director on Call) for the Hospitalists listed on amion for assistance.

## 2023-03-28 NOTE — TOC Progression Note (Signed)
 Transition of Care [facility]) - Progression Note    Patient Details  Name: [name] MRN: [id number] Date of Birth: 1945-12-13  Transition [facility]) CM/SW Contact  Delilah Shan, LCSWA Phone Number: 03/28/2023, 10:15 AM  Clinical Narrative:     CSW spoke with Crystal with [facility] who confirmed SNF bed for patient. Crystal confirmed she will start insurance authorization for patient. CSW will continue to follow.  Expected Discharge Plan: Skilled Nursing Facility Barriers to Discharge: Continued Medical Work up  Expected Discharge Plan and Services In-house Referral: Clinical Social Work Discharge Planning Services: CM Consult   Living arrangements for the past 2 months: Single Family Home                                       Social Determinants of Health (SDOH) Interventions SDOH Screenings   Food Insecurity: No Food Insecurity (03/21/2023)  Housing: Low Risk  (03/21/2023)  Transportation Needs: No Transportation Needs (03/21/2023)  Utilities: Not At Risk (03/21/2023)  Social Connections: Socially Isolated (03/21/2023)  Tobacco Use: Medium Risk (03/22/2023)    Readmission Risk Interventions    03/22/2023    1:22 PM 11/26/2022   12:03 PM 11/19/2022   11:49 AM  Readmission Risk Prevention Plan  Transportation Screening Complete Complete Complete  PCP or Specialist Appt within 5-7 Days  Complete Complete  PCP or Specialist Appt within 3-5 Days Complete    Home Care Screening  Complete Complete  Medication Review (RN CM)  Complete Complete  HRI or Home Care Consult Complete    Medication Review (RN Care Manager) Complete

## 2023-03-28 NOTE — Progress Notes (Signed)
 Physical Therapy Treatment Patient Details Name: [name] MRN: [id number] DOB: [birth year] Today's Date: 03/28/2023   History of Present Illness Pt is a 78 y.o. female presenting 03/20/23 with SOB and Bil LE swelling. Admitted with chronic diastolic CHF. PMHx CAD, mid aortic stenosis, HTN, anxiety, chronic pain syndrome, GERD, hypothyroidism, HLD, ILD, CKD 3A    PT Comments  Pt with family members in room, agreeable to get to chair to eat her dinner. As pt begins to move found to be incontinent of stool. Pt requiring maxA for rolling for pericare. Once clean pt min A for bringing LE off bed and bringing trunk to upright. Pt min A for power up to standing and contact guard for stepping to recliner. Pt HR 95-110 with movement. D/c plan remains appropriate. PT will continue to follow acutely.     If plan is discharge home, recommend the following: A lot of help with walking and/or transfers;A lot of help with bathing/dressing/bathroom;Assistance with cooking/housework;Direct supervision/assist for medications management;Assist for transportation;Help with stairs or ramp for entrance;Direct supervision/assist for financial management   Can travel by private vehicle     Yes  Equipment Recommendations  BSC/3in1;Wheelchair (measurements PT)       Precautions / Restrictions Precautions Precautions: Fall Recall of Precautions/Restrictions: Intact Precaution/Restrictions Comments: Watch HR.  pt has resting jaw tremor Restrictions Weight Bearing Restrictions Per Provider Order: No     Mobility  Bed Mobility Overal bed mobility: Needs Assistance Bed Mobility: Supine to Sit, Rolling Rolling: Max assist   Supine to sit: HOB elevated, Used rails, Min assist     General bed mobility comments: max A for rolling for pericare prior to mobilization and mod A for moving LE off bed and bringing trunk to upright    Transfers Overall transfer level: Needs assistance Equipment used: Rolling walker  (2 wheels) Transfers: Sit to/from Stand, Bed to chair/wheelchair/BSC Sit to Stand: Min assist   Step pivot transfers: Contact guard assist, Min assist       General transfer comment: light min A for power up to standing and contact guard to pivot to recliner.        Balance Overall balance assessment: Needs assistance Sitting-balance support: No upper extremity supported, Feet supported Sitting balance-Leahy Scale: Fair Sitting balance - Comments: cues for upright posture Postural control:  (anterior lean with increased cervical flexion) Standing balance support: Reliant on assistive device for balance, Bilateral upper extremity supported, Single extremity supported Standing balance-Leahy Scale: Poor Standing balance comment: requires UE support                            Communication Communication Communication: Impaired Factors Affecting Communication: Hearing impaired;Difficulty expressing self  Cognition Arousal: Alert Behavior During Therapy: Flat affect   PT - Cognitive impairments: Sequencing, Initiation, Awareness                       PT - Cognition Comments: pt requires increased cuing and time but is able to complete task at hand Following commands: Impaired Following commands impaired: Follows one step commands inconsistently, Follows one step commands with increased time    Cueing Cueing Techniques: Verbal cues, Visual cues, Tactile cues, Gestural cues     General Comments General comments (skin integrity, edema, etc.): VSS on 4L O2 via [place]      Pertinent Vitals/Pain Pain Assessment Pain Assessment: No/denies pain     PT Goals (current goals can now be  found in the care plan section) Acute Rehab PT Goals Patient Stated Goal: improve mobility PT Goal Formulation: With patient Time For Goal Achievement: 04/06/23 Potential to Achieve Goals: Good Progress towards PT goals: Progressing toward goals    Frequency    Min  3X/week       AM-PAC PT "6 Clicks" Mobility   Outcome Measure  Help needed turning from your back to your side while in a flat bed without using bedrails?: A Lot Help needed moving from lying on your back to sitting on the side of a flat bed without using bedrails?: A Lot Help needed moving to and from a bed to a chair (including a wheelchair)?: A Little Help needed standing up from a chair using your arms (e.g., wheelchair or bedside chair)?: A Little Help needed to walk in hospital room?: Total Help needed climbing 3-5 steps with a railing? : Total 6 Click Score: 12    End of Session Equipment Utilized During Treatment: Gait belt;Oxygen Activity Tolerance: Patient limited by fatigue Patient left: in chair;with call bell/phone within reach;with chair alarm set Nurse Communication: Mobility status PT Visit Diagnosis: Unsteadiness on feet (R26.81);Other abnormalities of gait and mobility (R26.89);Muscle weakness (generalized) (M62.81);Difficulty in walking, not elsewhere classified (R26.2)     Time: 9147-8295 PT Time Calculation (min) (ACUTE ONLY): 42 min  Charges:    $Therapeutic Activity: 23-37 mins PT General Charges $$ ACUTE PT VISIT: 1 Visit                     Silvie Obremski B. Beverely Risen PT, DPT Acute Rehabilitation Services Please use secure chat or  Call Office [phone]    [name] [facility] 03/28/2023, 4:53 PM

## 2023-03-28 NOTE — Plan of Care (Signed)
  Problem: Education: Goal: Knowledge of General Education information will improve Description: Including pain rating scale, medication(s)/side effects and non-pharmacologic comfort measures Outcome: Not Progressing   Problem: Health Behavior/Discharge Planning: Goal: Ability to manage health-related needs will improve Outcome: Not Progressing   Problem: Activity: Goal: Risk for activity intolerance will decrease Outcome: Not Progressing   Problem: Nutrition: Goal: Adequate nutrition will be maintained Outcome: Not Progressing   Problem: Pain Managment: Goal: General experience of comfort will improve and/or be controlled Outcome: Not Progressing   Problem: Safety: Goal: Ability to remain free from injury will improve Outcome: Not Progressing

## 2023-03-28 NOTE — Progress Notes (Signed)
 Patient ID: [name], female   DOB: 12/31/1945, 78 y.o.   MRN: [id number]  At 1045 patient c/o 3/10. VS:  BP 101/75, HR 115, 100% on 7 L HFNC.   Attending Pahwani MD and Eden Emms MD with Cardiology notified. Orders obtained for STAT ECG and nitroglycerin SL. STAT ECG normal other than known afib. 1 nitroglycerin given. VS: BP 97/68, HR 126, 96% . Patient currently pain free. Stat troponin sent to lab.  Lidia Collum, RN

## 2023-03-29 DIAGNOSIS — Z515 Encounter for palliative care: Secondary | ICD-10-CM | POA: Diagnosis not present

## 2023-03-29 DIAGNOSIS — Z7189 Other specified counseling: Secondary | ICD-10-CM

## 2023-03-29 DIAGNOSIS — I509 Heart failure, unspecified: Secondary | ICD-10-CM | POA: Diagnosis not present

## 2023-03-29 DIAGNOSIS — Z789 Other specified health status: Secondary | ICD-10-CM

## 2023-03-29 DIAGNOSIS — I4819 Other persistent atrial fibrillation: Secondary | ICD-10-CM | POA: Diagnosis not present

## 2023-03-29 DIAGNOSIS — Z66 Do not resuscitate: Secondary | ICD-10-CM

## 2023-03-29 DIAGNOSIS — I5033 Acute on chronic diastolic (congestive) heart failure: Secondary | ICD-10-CM | POA: Diagnosis not present

## 2023-03-29 LAB — CBC
HCT: 32.8 % — ABNORMAL LOW (ref 36.0–46.0)
Hemoglobin: 10.3 g/dL — ABNORMAL LOW (ref 12.0–15.0)
MCH: 28.5 pg (ref 26.0–34.0)
MCHC: 31.4 g/dL (ref 30.0–36.0)
MCV: 90.9 fL (ref 80.0–100.0)
Platelets: 470 10*3/uL — ABNORMAL HIGH (ref 150–400)
RBC: 3.61 MIL/uL — ABNORMAL LOW (ref 3.87–5.11)
RDW: 13.6 % (ref 11.5–15.5)
WBC: 9.7 10*3/uL (ref 4.0–10.5)
nRBC: 0 % (ref 0.0–0.2)

## 2023-03-29 LAB — BASIC METABOLIC PANEL
Anion gap: 14 (ref 5–15)
BUN: 44 mg/dL — ABNORMAL HIGH (ref 8–23)
CO2: 32 mmol/L (ref 22–32)
Calcium: 6.3 mg/dL — CL (ref 8.9–10.3)
Chloride: 92 mmol/L — ABNORMAL LOW (ref 98–111)
Creatinine, Ser: 1.65 mg/dL — ABNORMAL HIGH (ref 0.44–1.00)
GFR, Estimated: 32 mL/min — ABNORMAL LOW (ref >60)
Glucose, Bld: 149 mg/dL — ABNORMAL HIGH (ref 70–99)
Potassium: 3.7 mmol/L (ref 3.5–5.1)
Sodium: 138 mmol/L (ref 135–145)

## 2023-03-29 LAB — BRAIN NATRIURETIC PEPTIDE: B Natriuretic Peptide: 676 pg/mL — ABNORMAL HIGH (ref 0.0–100.0)

## 2023-03-29 MED ORDER — DIGOXIN 125 MCG PO TABS: 0.1250 mg | ORAL_TABLET | Freq: Every day | ORAL | Status: DC

## 2023-03-29 MED ORDER — POLYETHYLENE GLYCOL 3350 17 G PO PACK: 17.0000 g | PACK | Freq: Every day | ORAL | Status: DC | PRN

## 2023-03-29 MED ORDER — DIGOXIN 125 MCG PO TABS
0.2500 mg | ORAL_TABLET | Freq: Once | ORAL | Status: AC
  Administered 2023-03-29: 0.25 mg via ORAL
  Filled 2023-03-29: qty 2

## 2023-03-29 MED ORDER — GLYCOPYRROLATE 0.2 MG/ML IJ SOLN: 0.2000 mg | INTRAMUSCULAR | Status: DC | PRN

## 2023-03-29 MED ORDER — ACETAMINOPHEN 325 MG PO TABS: 650.0000 mg | ORAL_TABLET | Freq: Four times a day (QID) | ORAL | Status: DC | PRN

## 2023-03-29 MED ORDER — ACETAMINOPHEN 650 MG RE SUPP: 650.0000 mg | Freq: Four times a day (QID) | RECTAL | Status: DC | PRN

## 2023-03-29 MED ORDER — MORPHINE SULFATE (PF) 2 MG/ML IV SOLN: 1.0000 mg | INTRAVENOUS | Status: DC | PRN

## 2023-03-29 MED ORDER — AMIODARONE HCL 200 MG PO TABS
200.0000 mg | ORAL_TABLET | Freq: Two times a day (BID) | ORAL | Status: DC
  Administered 2023-03-29 – 2023-04-01 (×7): 200 mg via ORAL
  Filled 2023-03-29 (×7): qty 1

## 2023-03-29 MED ORDER — GLYCOPYRROLATE 1 MG PO TABS: 1.0000 mg | ORAL_TABLET | ORAL | Status: DC | PRN

## 2023-03-29 MED ORDER — ENSURE ENLIVE PO LIQD: 237.0000 mL | Freq: Every day | ORAL | Status: DC | PRN

## 2023-03-29 MED ORDER — DIGOXIN 0.25 MG/ML IJ SOLN: 0.1250 mg | Freq: Every day | INTRAMUSCULAR | Status: DC

## 2023-03-29 MED ORDER — POLYVINYL ALCOHOL 1.4 % OP SOLN: 1.0000 [drp] | Freq: Four times a day (QID) | OPHTHALMIC | Status: DC | PRN

## 2023-03-29 NOTE — Progress Notes (Signed)
 Heart Failure Navigator Progress Note  Assessed for Heart & Vascular TOC clinic readiness.  Patient does not meet criteria due to transitioned to comfort care. .   Navigator will sign off at this time.   Rhae Hammock, BSN, Scientist, clinical (histocompatibility and immunogenetics) Only

## 2023-03-29 NOTE — Progress Notes (Signed)
 OT Cancellation Note and Discharge  Patient Details Name: [name] MRN: [id number] DOB: February 28, 1945   Cancelled Treatment:    Reason Eval/Treat Not Completed: Other (comment). Per code status order and MD note from today, pt is now comfort care, we will sign off.  Lindon Romp OT Acute Rehabilitation Services Office [phone]    Evette Georges 03/29/2023, 2:06 PM

## 2023-03-29 NOTE — Progress Notes (Signed)
 Progress Note  Patient Name: [name] Date of Encounter: 03/29/2023  Primary Cardiologist: Armanda Magic, MD   Subjective   No chest pain. Afib rates better controlled   Inpatient Medications    Scheduled Meds:  apixaban  2.5 mg Oral BID   atorvastatin  80 mg Oral Daily   carvedilol  3.125 mg Oral BID WC   Chlorhexidine Gluconate Cloth  6 each Topical Daily   digoxin  0.25 mg Oral Daily   feeding supplement  237 mL Oral BID BM   levETIRAcetam  500 mg Oral BID   levothyroxine  75 mcg Oral Q0600   methylPREDNISolone (SOLU-MEDROL) injection  40 mg Intravenous Q12H   pantoprazole  40 mg Oral BID   polyethylene glycol  17 g Oral Daily   senna-docusate  1 tablet Oral BID   sodium chloride flush  10-40 mL Intracatheter Q12H   Continuous Infusions:  amiodarone 30 mg/hr (03/29/23 0518)   PRN Meds: acetaminophen **OR** acetaminophen, ALPRAZolam, HYDROcodone-acetaminophen, levalbuterol, loperamide, metoprolol tartrate, morphine injection, nitroGLYCERIN, ondansetron **OR** ondansetron (ZOFRAN) IV, mouth rinse, sodium chloride flush   Vital Signs    Vitals:   03/28/23 1940 03/28/23 2140 03/28/23 2340 03/29/23 0358  BP: 108/81 100/71 96/65 116/67  Pulse: (!) 106 92 92 95  Resp:    18  Temp:    97.8 F (36.6 C)  TempSrc:    Oral  SpO2: 100% 100% 100% 100%  Weight:      Height:        Intake/Output Summary (Last 24 hours) at 03/29/2023 0750 Last data filed at 03/29/2023 0518 Gross per 24 hour  Intake 1056.95 ml  Output 600 ml  Net 456.95 ml   Filed Weights   03/26/23 0318 03/27/23 0500 03/28/23 0302  Weight: 49.7 kg 47.7 kg 44.8 kg    Telemetry    Sinus rhythm  - Personally Reviewed  ECG     - Personally Reviewed  Physical Exam    Elderly female Tremor in lower jaw Decreased BS base AS murmur  Prior sternotimy  Legs wrapped   Labs    Chemistry Recent Labs  Lab 03/23/23 0405 03/24/23 0545 03/25/23 0500 03/26/23 0500 03/27/23 0530  03/28/23 0907  NA 138 137   < > 138 140 137  K 3.3* 3.4*   < > 3.4* 3.7 3.2*  CL 104 105   < > 103 100 91*  CO2 21* 24   < > 26 28 29   GLUCOSE 161* 165*   < > 118* 120* 167*  BUN 56* 46*   < > 32* 30* 34*  CREATININE 1.91* 1.59*   < > 1.58* 1.60* 1.67*  CALCIUM 6.4* 6.8*   < > 7.0* 7.1* 6.6*  ALBUMIN 2.4* 2.3*  --   --   --   --   GFRNONAA 27* 33*   < > 34* 33* 32*  ANIONGAP 13 8   < > 9 12 17*   < > = values in this interval not displayed.     Hematology Recent Labs  Lab 03/26/23 0500 03/27/23 0530 03/28/23 0430  WBC 10.4 11.7* 9.7  RBC 3.21* 3.40* 3.46*  HGB 9.3* 9.9* 10.3*  HCT 29.9* 31.1* 31.1*  MCV 93.1 91.5 89.9  MCH 29.0 29.1 29.8  MCHC 31.1 31.8 33.1  RDW 13.9 13.9 13.7  PLT 337 385 398    Cardiac EnzymesNo results for input(s): "TROPONINI" in the last 168 hours. No results for input(s): "TROPIPOC" in the last  168 hours.   BNP Recent Labs  Lab 03/25/23 0933 03/27/23 0530  BNP 1,201.7* 876.3*     DDimer No results for input(s): "DDIMER" in the last 168 hours.   Radiology    No results found.   Cardiac Studies   Echo 03/21/23 EF 60-65% mild MR moderate AS mean gradient 8 AVA 1.1 cm2 low flow low gradient AS   Patient Profile     78 y.o. female  a hx of HFpEF, CAD s/p multiple interventions (DES to RCA in 2011 for MI, DES to RCA on 06/2010, DES to LAD in 2014), hypertension, hyperlipidemia, aortic stenosis, esophageal strictures s/p dilation, GERD, hypothyroidism, renal insufficiency, 2017 VATS with chronic bronchiolitis without ILD, DVT in 2019, pericardial cyst s/p resection who is admitted for acute on chronic HFpEF, new onset A. Fib.   Assessment & Plan    Acute on chronic HFpEF with new RV dysfunction  Mild to moderate Tricuspid regurgitation Moderate aortic stenosis ( moderate low flow/low gradient )  New onset atrial fibrillation with rvr  CAD s/p PCI AKI on CKD 3b Hypertension   Afib rates improved with Dig/beta blocker will give dig  today 0.25 mg then decrease to maintenance dose 0.125 mg Not a candidate for [facility] given age/dementia Transition amiodarone to oral today      For questions or updates, please contact [facility] Please consult [email or web address] for contact info under Cardiology/STEMI.      Signed, Charlton Haws, MD  03/29/2023, 7:50 AM

## 2023-03-29 NOTE — Progress Notes (Signed)
 Date and time results received: 03/29/23 0816  Test: Calcium Critical Value: 6.3  Name of Provider Notified: Hughie Closs, MD  Orders Received? Or Actions Taken?:  No new orders at this time

## 2023-03-29 NOTE — TOC Progression Note (Addendum)
 Transition of Care [facility]) - Progression Note    Patient Details  Name: [name] MRN: [id number] Date of Birth: February 23, 1945  [facility] CM/SW Contact  Michaela Corner, [place] Phone Number: 03/29/2023, 10:56 AM  Clinical Narrative:   CSW checked on pending auth with Crystal at [facility]. Per Crystal, Berkley Harvey is still pending at this time.   CSW asked bedside RN and RRT about Cpap/Bipap needs for facility, awaiting a response.  12:07 PM Per bedside RN, patient has been refusing bipap/cpap.  2:51 PM CSW notified by MD that patient now comfort care and may need to look at hospice facility for dc. CSW spoke with patients niece, [name], about choice of hospice facility/agency. Per [name], she would like to go with [facility] for [employer]. CSW sent referral to [facility] and at this time there is no bed availability at [facility] but [facility] will complete evaluation and follow up on bed availability.   TOC will continue to follow.   Expected Discharge Plan: Skilled Nursing Facility Barriers to Discharge: Continued Medical Work up  Expected Discharge Plan and Services In-house Referral: Clinical Social Work Discharge Planning Services: CM Consult   Living arrangements for the past 2 months: Single Family Home                                       Social Determinants of Health (SDOH) Interventions SDOH Screenings   Food Insecurity: No Food Insecurity (03/21/2023)  Housing: Low Risk  (03/21/2023)  Transportation Needs: No Transportation Needs (03/21/2023)  Utilities: Not At Risk (03/21/2023)  Social Connections: Socially Isolated (03/21/2023)  Tobacco Use: Medium Risk (03/22/2023)    Readmission Risk Interventions    03/22/2023    1:22 PM 11/26/2022   12:03 PM 11/19/2022   11:49 AM  Readmission Risk Prevention Plan  Transportation Screening Complete Complete Complete  PCP or Specialist Appt within 5-7 Days  Complete Complete  PCP or Specialist Appt within  3-5 Days Complete    Home Care Screening  Complete Complete  Medication Review (RN CM)  Complete Complete  HRI or Home Care Consult Complete    Medication Review (RN Care Manager) Complete

## 2023-03-29 NOTE — Progress Notes (Signed)
 Physical Therapy Discharge Patient Details Name: [name] MRN: [id number] DOB: [birth year] Today's Date: 03/29/2023 Time:  -     Patient discharged from PT services secondary to  pt has transitioned to comfort care  .  Please see latest therapy progress note for current level of functioning and progress toward goals.        [name] Fleet 03/29/2023, 2:08 PM

## 2023-03-29 NOTE — Plan of Care (Signed)
  Problem: Pain Managment: Goal: General experience of comfort will improve and/or be controlled Outcome: Progressing   Problem: Safety: Goal: Ability to remain free from injury will improve Outcome: Progressing   Problem: Skin Integrity: Goal: Risk for impaired skin integrity will decrease Outcome: Progressing

## 2023-03-29 NOTE — Progress Notes (Signed)
 Mobility Specialist Progress Note:    03/29/23 1201  Mobility  Activity  (bed level exercises)  Level of Assistance Minimal assist, patient does 75% or more  Assistive Device Other (Comment) (HHA)  Activity Response Tolerated fair  Mobility Referral Yes  Mobility visit 1 Mobility  Mobility Specialist Start Time (ACUTE ONLY) 1105  Mobility Specialist Stop Time (ACUTE ONLY) 1115  Mobility Specialist Time Calculation (min) (ACUTE ONLY) 10 min   Pt received in bed declining ambulation. Despite max encouragement pt refused to get OOB but agreeable to bed level exercises. Pt needed MinA throughout d/t feeling fatigued today. Left in bed w/ call bell and personal belongings in reach. All needs met.   Thompson Grayer Mobility Specialist  Please contact vis Secure Chat or  Rehab Office [phone]

## 2023-03-29 NOTE — Consult Note (Signed)
 Consultation Note Date: 03/29/2023   Patient Name: [name]  DOB: 1945-06-02  MRN: [id number]  Age / Sex: 78 y.o., female  PCP: Merri Brunette, MD Referring Physician: Hughie Closs, MD  Reason for Consultation: Establishing goals of care  HPI/Patient Profile: 78 y.o. female  with past medical history of CAD with PCI to RCA, recent cath 9/23 with nonobstructive CAD and patent stents, mild aortic stenosis, echo EF 60-65% in 2023, HTN, anxiety, chronic pain syndrome, GERD, hypothyroidism, HLD, ILD, CKD 3a was admitted on 03/20/2023 with acute on chronic diastolic CHF, acute urinary retention, possible cellulitis, elevated troponin.  After discussions with attending, patient and family opted for patient's transition to full comfort care on 03/29/2023.  Of note, patient has had 2 admissions and 1 ED visit in the last 6 months  Clinical Assessment and Goals of Care: I have reviewed medical records including EPIC notes, labs, any available advanced directives, and imaging. Received report from primary RN -no acute concerns.  RN reports patient has no to minimal oral intake (accepting medications only).  Per RN patient is confused at times.  Went to visit patient at bedside - no family/visitors present. Patient was lying in bed asleep - did not attempt to wake her to preserve comfort. No signs or non-verbal gestures of pain or discomfort noted. No respiratory distress, increased work of breathing, or secretions noted.   Met with niece/Katherine to discuss diagnosis, prognosis, GOC, EOL wishes, disposition, and options.  I introduced Palliative Medicine as specialized medical care for people living with serious illness. It focuses on providing relief from the symptoms and stress of a serious illness. The goal is to improve quality of life for both the patient and the family.  We discussed a brief life review of the  patient as well as functional and nutritional status.  Patient's husband has passed away.  She has no children.   [name] has a clear understanding of patient's current acute medical situation.  We talked about transition to comfort measures in house and what that would entail inclusive of medications to control pain, dyspnea, agitation, nausea, and itching. We discussed stopping all unnecessary measures such as blood draws, needle sticks, oxygen, antibiotics, CBGs/insulin, cardiac monitoring, IVF, and frequent vital signs. Education provided that other non-pharmacological interventions would be utilized for holistic support and comfort such as spiritual support if requested, repositioning, music therapy, offering comfort feeds, and/or therapeutic listening. All care would focus on how the patient is looking and feeling. [name] confirms goal is for full comfort care only. [name] tells me patient "is done."  [name] reports that patient has been expressing her goodbyes to family since Sunday.  Discussed options of residential versus home hospice - she is most interested in patient's transfer to hospice facility, requesting [facility].  Visit also consisted of discussions dealing with the complex and emotionally intense issues of symptom management and palliative care in the setting of serious and potentially life-threatening illness.   Discussed with family the importance of continued conversation with each other and the medical providers regarding overall plan of care and treatment options, ensuring decisions are within the context of the patient's values and GOCs.    Questions and concerns were addressed. The patient/family was encouraged to call with questions and/or concerns. PMT number was provided.   Primary Decision Maker: NEXT OF KIN - niece/Katherine Stanley    SUMMARY OF RECOMMENDATIONS   Continue full comfort measures Continue DNR/DNI as previously documented - durable DNR  form completed  and placed in shadow chart. Copy was made and will be scanned into Vynca/ACP tab Family most interested in inpatient hospice transfer, requesting [facility] - TOC already consulted Added orders for EOL symptom management and to reflect full comfort measures, as well as discontinued orders that were not focused on comfort Unrestricted visitation orders were placed per current [facility] EOL visitation policy  Nursing to provide frequent assessments and administer PRN medications as clinically necessary to ensure EOL comfort PMT will continue to follow and support holistically  Symptom Management Morphine PRN severe pain/dyspnea/increased work of breathing/RR>25 Norco PRN moderate pain Tylenol PRN pain/fever Biotin twice daily Robinul PRN secretions Haldol PRN agitation/delirium Ativan PRN anxiety/seizure/sleep/distress Zofran PRN nausea/vomiting Liquifilm Tears PRN dry eye Nitroglycerin PRN chest pain Continue amiodarone, Xanax PRN, Eliquis, Keppra, Coreg, Synthroid, Imodium PRN, MiraLAX PRN, Senokot for comfort while tolerating p.o.'s   Code Status/Advance Care Planning: DNR  Palliative Prophylaxis:  Aspiration, Delirium Protocol, Frequent Pain Assessment, Oral Care, and Turn Reposition  Additional Recommendations (Limitations, Scope, Preferences): Full Comfort Care  Psycho-social/Spiritual:  Desire for further Chaplaincy support:no Created space and opportunity for patient and family to express thoughts and feelings regarding patient's current medical situation.  Emotional support and therapeutic listening provided.  Prognosis:  < 2 weeks  Discharge Planning: Hospice facility      Primary Diagnoses: Present on Admission:  Acute on chronic diastolic (congestive) heart failure (HCC)  Hypertension  Depression  Hyperlipidemia  Hypothyroidism  Chronic pain syndrome  GERD  ILD (interstitial lung disease) (HCC)  IBS  CAD in native artery  Chronic  kidney disease, stage 3a (HCC)   I have reviewed the medical record, interviewed the patient and family, and examined the patient. The following aspects are pertinent.  Past Medical History:  Diagnosis Date   Anxiety    Arthritis    Blood transfusion without reported diagnosis    childhood   Carpal tunnel syndrome, bilateral 07/31/2015   Cataract    Cervical spondylosis without myelopathy    Chronic chest pain    Chronic kidney disease    Chronic low back pain    Chronotropic incompetence    CKD (chronic kidney disease), stage III (HCC)    Coronary artery disease    a. inf-post MI 2011 s/p DES to RCA. b. DES to RCA 06/2010. c. DES to LAD 2014, residual dz treated medically.   Depression    DVT (deep venous thrombosis) (HCC)    left   Esophageal stricture    GERD (gastroesophageal reflux disease)    Gross hematuria    Heart murmur    History of esophageal dilatation    FOR STRIUCTURE   History of gout    History of hiatal hernia    History of kidney stones    Hyperlipidemia    Hypertension    Hypothyroidism    Internal hemorrhoids    Irritable bowel syndrome    Mild aortic stenosis    by echo 2018   Myocardial infarction (HCC)    Obesity    Orthostatic hypotension    Partial seizure disorder (HCC) NEUROLOGIST-- DR Anne Hahn   NOCTURNAL   PONV (postoperative nausea and vomiting)    hard time getting iv site-had to do neck stick 2 yrs ago   Pulmonary fibrosis (HCC)    S/P pericardial cyst excision    02-05-2013  benign   Seizures (HCC)    none in yrs   SVT (supraventricular tachycardia) (HCC)    Trigger finger, acquired 09/30/2015  Right middle finger   Vitamin D deficiency    Weakness    Social History   Socioeconomic History   Marital status: Married    Spouse name: Peyton Najjar   Number of children: 0   Years of education: hs   Highest education level: Not on file  Occupational History   Occupation: Retired    Associate Professor: RETIRED  Tobacco Use   Smoking  status: Former    Current packs/day: 0.00    Average packs/day: 0.3 packs/day for 3.0 years (0.9 ttl pk-yrs)    Types: Cigarettes    Start date: 11/08/1964    Quit date: 11/09/1967    Years since quitting: 55.4   Smokeless tobacco: Never  Vaping Use   Vaping status: Never Used  Substance and Sexual Activity   Alcohol use: No    Alcohol/week: 0.0 standard drinks of alcohol   Drug use: No   Sexual activity: Not on file  Other Topics Concern   Not on file  Social History Narrative   Lives at home, married   Left-handed   Daily caffeine use: coffee.   Social Drivers of Corporate investment banker Strain: Not on file  Food Insecurity: No Food Insecurity (03/21/2023)   Hunger Vital Sign    Worried About Running Out of Food in the Last Year: Never true    Ran Out of Food in the Last Year: Never true  Transportation Needs: No Transportation Needs (03/21/2023)   PRAPARE - Administrator, Civil Service (Medical): No    Lack of Transportation (Non-Medical): No  Physical Activity: Not on file  Stress: Not on file  Social Connections: Socially Isolated (03/21/2023)   Social Connection and Isolation Panel [NHANES]    Frequency of Communication with Friends and Family: More than three times a week    Frequency of Social Gatherings with Friends and Family: Once a week    Attends Religious Services: Never    Database administrator or Organizations: No    Attends Banker Meetings: Never    Marital Status: Widowed   Family History  Problem Relation Age of Onset   Heart attack Mother    CVA Mother    Heart disease Father    Hypertension Father    Emphysema Father    Coronary artery disease Father    Diabetes Mellitus I Father    Breast cancer Sister    Breast cancer Niece        niece   Colon cancer Neg Hx    Stomach cancer Neg Hx    Esophageal cancer Neg Hx    Rectal cancer Neg Hx    Seizures Neg Hx    Scheduled Meds:  amiodarone  200 mg Oral BID    apixaban  2.5 mg Oral BID   atorvastatin  80 mg Oral Daily   carvedilol  3.125 mg Oral BID WC   Chlorhexidine Gluconate Cloth  6 each Topical Daily   [START ON 03/30/2023] digoxin  0.125 mg Oral Daily   feeding supplement  237 mL Oral BID BM   levETIRAcetam  500 mg Oral BID   levothyroxine  75 mcg Oral Q0600   methylPREDNISolone (SOLU-MEDROL) injection  40 mg Intravenous Q12H   pantoprazole  40 mg Oral BID   polyethylene glycol  17 g Oral Daily   senna-docusate  1 tablet Oral BID   sodium chloride flush  10-40 mL Intracatheter Q12H   Continuous Infusions: PRN Meds:.acetaminophen **OR** acetaminophen, ALPRAZolam,  glycopyrrolate **OR** glycopyrrolate **OR** glycopyrrolate, HYDROcodone-acetaminophen, levalbuterol, loperamide, metoprolol tartrate, morphine injection, nitroGLYCERIN, ondansetron **OR** ondansetron (ZOFRAN) IV, mouth rinse, polyvinyl alcohol, sodium chloride flush Medications Prior to Admission:  Prior to Admission medications   Medication Sig Start Date End Date Taking? Authorizing Provider  acetaminophen (TYLENOL) 500 MG tablet Take 500 mg by mouth every 6 (six) hours as needed for mild pain (pain score 1-3) or moderate pain (pain score 4-6).   Yes [provider]  ALPRAZolam (XANAX) 0.25 MG tablet Take 1 tablet (0.25 mg total) by mouth 2 (two) times daily. 11/26/22  Yes Sheikh, Omair Latif, DO  ARIPiprazole (ABILIFY) 5 MG tablet Take 5 mg by mouth daily. 02/22/22  Yes [provider]  aspirin EC 81 MG tablet Take 81 mg by mouth daily.   Yes [provider]  calcitRIOL (ROCALTROL) 0.25 MCG capsule Take 0.25 mcg by mouth daily.   Yes [provider]  [facility] D3 50 MCG (2000 UT) CAPS Take 2,000 Units by mouth daily. 02/11/18  Yes [provider]  diltiazem (CARDIZEM CD) 180 MG 24 hr capsule Take 180 mg by mouth daily. 02/14/23  Yes [provider]  DULoxetine (CYMBALTA) 30 MG capsule Take 30 mg by mouth daily.   Yes [provider]  DULoxetine (CYMBALTA) 60 MG capsule Take 60 mg by mouth daily.   Yes [provider]  ferrous sulfate 325 (65 FE) MG tablet Take 325 mg by mouth daily with breakfast. 12/02/15  Yes [provider]  HYDROcodone-acetaminophen (NORCO) 10-325 MG tablet Take 1 tablet by mouth every 6 (six) hours as needed for moderate pain (pain score 4-6) or severe pain (pain score 7-10). 11/26/22  Yes Sheikh, Omair Latif, DO  levETIRAcetam (KEPPRA) 500 MG tablet TAKE 1 TABLET BY MOUTH TWICE A DAY 01/03/23  Yes Butch Penny, NP  levothyroxine (SYNTHROID) 88 MCG tablet Take 88 mcg by mouth every morning. 09/09/21  Yes [provider]  nebivolol (BYSTOLIC) 5 MG tablet Take 0.5 tablets (2.5 mg total) by mouth in the morning and at bedtime. Please keep upcoming appointment in April 2025 for future refills. Thank you Patient taking differently: Take 5 mg by mouth daily. 03/07/23  Yes Turner, Cornelious Bryant, MD  nitroGLYCERIN (NITROSTAT) 0.4 MG SL tablet PLACE 1 TABLET (0.4 MG TOTAL) UNDER THE TONGUE EVERY 5 (FIVE) MINUTES AS NEEDED FOR CHEST PAIN. 07/17/20  Yes Turner, Cornelious Bryant, MD  ondansetron (ZOFRAN) 4 MG tablet Take 1 tablet (4 mg total) by mouth every 6 (six) hours as needed for nausea. 11/26/22  Yes Sheikh, Omair Latif, DO  pantoprazole (PROTONIX) 40 MG tablet Take 1 tablet (40 mg total) by mouth 2 (two) times daily. 02/07/23  Yes Hilarie Fredrickson, MD  potassium chloride SA (K-DUR,KLOR-CON) 20 MEQ tablet Take 20 mEq by mouth 2 (two) times daily.  09/02/17  Yes [provider]  rosuvastatin (CRESTOR) 40 MG tablet Take 1 tablet (40 mg total) by mouth daily. 03/02/22  Yes Quintella Reichert, MD   No Known Allergies Review of Systems  Unable to perform ROS: Other    Physical Exam Vitals and nursing note reviewed.  Constitutional:      General: She is not in acute distress.    Appearance: She is ill-appearing.  Pulmonary:     Effort: No respiratory distress.  Skin:    General: Skin is warm  and dry.  Neurological:     Mental Status: She is lethargic.     Motor: Weakness present.  Vital Signs: BP 103/63 (BP Location: Left Arm)   Pulse 99   Temp 97.9 F (36.6 C) (Oral)   Resp 18   Ht 5' (1.524 m)   Wt 44.8 kg   SpO2 97%   BMI 19.29 kg/m  Pain Scale: 0-10   Pain Score: 0-No pain   SpO2: SpO2: 97 % O2 Device:SpO2: 97 % O2 Flow Rate: .O2 Flow Rate (L/min): 2 L/min  IO: Intake/output summary:  Intake/Output Summary (Last 24 hours) at 03/29/2023 1448 Last data filed at 03/29/2023 1341 Gross per 24 hour  Intake 866.95 ml  Output 1000 ml  Net -133.05 ml    LBM: Last BM Date : 03/28/23 Baseline Weight: Weight: 49.9 kg Most recent weight: Weight: 44.8 kg     Palliative Assessment/Data: PPS 10-20%     Time In: 1445 Time Out: 1600 Time Total: 75 minutes  Signed by: Haskel Khan, NP   Please contact Palliative Medicine Team phone at [phone] for questions and concerns.  For individual provider: See Amion  *Portions of this note are a verbal dictation therefore any spelling and/or grammatical errors are due to the "[employer]agon [employer] One" system interpretation.

## 2023-03-29 NOTE — Progress Notes (Signed)
 PROGRESS NOTE    [name]  ZOX:[id number] DOB: 02/10/1945 DOA: 03/20/2023 PCP: Merri Brunette, MD   Brief Narrative:  Patient with PMH of CAD with PCI to RCA, recent cath 9/23 with nonobstructive CAD and patent stents, mild aortic stenosis, echo EF 6065% in 2023, HTN, anxiety, chronic pain syndrome, GERD, hypothyroidism, HLD, ILD, CKD 3A presented to the hospital with complaints of leg swelling and shortness of breath. Went to see PCP in 2/27 there was some trace edema noted but no significant shortness of breath.   niece found the patient with very short of breath and therefore patient was brought to the hospital. Currently being treated for acute on chronic HFpEF.  Assessment & Plan:   Principal Problem:   Acute on chronic diastolic (congestive) heart failure (HCC) Active Problems:   Hypertension   Hyperlipidemia   Hypothyroidism   History of  seizures   Depression   CAD in native artery   GERD   IBS   ILD (interstitial lung disease) (HCC)   Mild aortic stenosis   Chronic pain syndrome   Chronic kidney disease, stage 3a (HCC)  Acute hypoxic respiratory failure secondary to acute on chronic diastolic CHF: Upon admission, BNP significantly elevated 1056.  Significant swelling in the leg.  Tripoding requiring BiPAP therapy and NRB in the ER. Chest x-ray shows significant vascular congestion.  There is comment about pneumonia although pneumonia is less likely. Echocardiogram 60 to 65% in September 2023. Last cath 9/23 40% stenosis and RPDA and second diagonal, otherwise nonobstructive CAD with patent stent in RCA. Continue BiPAP as needed.  Cardiology following and managing.  Diuretics were held for few days due to rising creatinine but she was started on Lasix 40 mg IV daily on 03/26/2023.  However in the afternoon that day Around 1:45 PM on 03/26/2023, rapid response was called due to shortness of breath and increasing oxygen demand.  Patient was noted to have crackles.  Chest  x-ray ordered, patient was started on Amio drip.  Patient had already received Lasix in the morning.  Another dose of Lasix was given.  Followed by this, the nurse called CODE BLUE on this patient around 2:30 PM while the patient was breathing and there was no loss of pulse.  CODE BLUE team arrived at the bedside and found patient sitting in bed, has pulse, able to respond. She on BIPAP @ bedside due to desaturation.  BiPAP was removed and she was placed on 10 L salter.  Due to continued high amount of oxygen requirement and rails, interstitial lung disease/pneumonitis was considered to be one of the cause for her current condition so I started her on Solu-Medrol on 03/27/2023.  Patient has started to improve symptomatically and her hypoxia is improving as well.  We are trying to wean oxygen.  Cardiology managing diuretics, she is no more on Lasix.  Her BNP is improving, lungs have very minimal rattles today, continue Solu-Medrol and bronchodilators.  AKI on CKD stage IIIb: Baseline creatinine appears to be around 1.3-1.5, peaked at 1.91 but improved to 1.6 on 03/24/2023 and has been stable.  Hypokalemia: Low again, Will replenish.  Acute anemia: Almost 3 months ago, patient's hemoglobin was normal.  She presents with 10.6 which gradually dropped to 8.6 but improved to 9.3 without any transfusion.  No reports of hematemesis, melena or hematochezia.  Cardiology concerned about GI bleed and ordered FOBT which was negative.    History of SVT but here with paroxysmal atrial fibrillation with RVR.  Patient has history of SVT and is on Cardizem PTA. Cardizem was on hold on admission to allow room for diuresis. Found to have A-fib with RVR early in the morning of 3/10.  Patient has been on and off amiodarone drip, cardiology has discontinued IV and started on oral amiodarone and she was started on Coreg and digoxin on 03/28/2023.  Rates better controlled.   Acute urinary retention. Was unable to urinate on her own  with bladder scan showing 400+ cc.  Placed Foley catheter which was eventually discontinued on 03/25/2023 and patient has had many successful voiding trials.   Possible cellulitis. Patient was presumed to have cellulitis of bilateral legs based on appearance.  She was started on cefazolin.  On my examination, I did not see any erythema, no warmth or tenderness.  Looks like antibiotics were started on 03/20/2023, will continue for total of 5 to 7 days.   History of CAD and mild aortic stenosis/elevated troponin. Last 2023.  Nonobstructive CAD with patent stent.Troponins minimally elevated but EKG does not show any evidence of acute ischemia.  Echo shows preserved EF.  No significant wall abnormality.  Likely demand ischemia.  Hypertension: Blood pressure on the low side.  All antihypertensives on hold.   Acute metabolic encephalopathy. Likely in the setting of hypotension. Per previous hospitalist documentation  "blood pressure corrected with midodrine with improvement in mentation".  However I did not see any midodrine ordered.  Patient is alert and oriented, needs some prompts though.   Poor venous access. RN requested a PICC line.  Placed.    GERD. Continue PPI.   Hypothyroidism. Continuing Synthroid.  Both TSH and free T4 elevated.  I reduced Synthroid from 88 mcg to 75 mcg starting 03/24/2023.  Repeat TSH in 6 to 8 weeks.   History of seizures. Continuing Keppra.  Switching to oral.   Depression. On Cymbalta, will resume tomorrow.   HLD. Continuing statin.   Goals of care conversation. Previous hospitalist discussed with the patient as well as in presence of niece.  Patient wanted to be DNR however per documentation/CODE STATUS order, she would like to be intubated if needed.  Patient herself did not appear to have the understanding of CODE STATUS at the moment so on 03/27/2023 I had a lengthy discussion with the patient's [facility] POA/niece [name] and she clarified that there  may have been some misunderstanding but patient is supposed to be full DNR with no intubation and no CPR.  I changed the CODE STATUS after talking to her.  We then talked in length about patient's poor prognosis and that I am concerned that patient may not do well during this hospitalization.  The niece was very much understanding of this as patient's husband went through hospitalization and eventually transition to comfort care a year ago or so.  She was very much accepting of that.    Palliative care consulted.  Awaiting evaluation.  However today patient said " I am ready to go" and I did clarify with her and she clearly said that she is ready to go meet the guard.  We also discussed if she would like for [place] to discontinue life-saving medications and focus on comfort care only and to that, she agreed.  I then spoke to the niece [name] again 03/29/2023.  She verbalized that the patient has been saying this for last 2 days and she brought other family members to meet her as well and patient basically said goodbye to them.  Samara Deist  believes that patient has made her mind and she is ready for [place] to transition her to comfort care and she is also on the same page so with her agreement, I have now switched her to comfort care and I have reached out to palliative care once again to help with the transition from here to hospice facility.   Prolonged QTc. Minimize QT prolonging medications.  At risk of further worsening in the setting of amiodarone use.  Monitor and EKG and telemetry.   DVT prophylaxis: apixaban (ELIQUIS) tablet 2.5 mg Start: 03/25/23 1045Heparin   Code Status: Limited: Do not attempt resuscitation (DNR) -DNR-LIMITED -Do Not Intubate/DNI   Family Communication:  None present at bedside.  Discussed with niece over the phone as detailed above.  Status is: Inpatient Remains inpatient appropriate because: Now comfort care.  Will benefit from transitioning to hospice facility.  Estimated body mass  index is 19.29 kg/m as calculated from the following:   Height as of this encounter: 5' (1.524 m).   Weight as of this encounter: 44.8 kg.    Nutritional Assessment: Body mass index is 19.29 kg/m.Marland Kitchen Seen by dietician.  I agree with the assessment and plan as outlined below: Nutrition Status:        . Skin Assessment: I have examined the patient's skin and I agree with the wound assessment as performed by the wound care RN as outlined below:    Consultants:  Cardiology  Procedures:  None  Antimicrobials:  Anti-infectives (From admission, onward)    Start     Dose/Rate Route Frequency Ordered Stop   03/20/23 2200  ceFAZolin (ANCEF) IVPB 1 g/50 mL premix        1 g 100 mL/hr over 30 Minutes Intravenous Every 12 hours 03/20/23 1754 03/23/23 2210         Subjective: Patient seen and examined.  Although she looks much better but she said that she is not feeling well and she is ready to go meet the guard.  Objective: a Vitals:   03/28/23 1940 03/28/23 2140 03/28/23 2340 03/29/23 0358  BP: 108/81 100/71 96/65 116/67  Pulse: (!) 106 92 92 95  Resp:    18  Temp:    97.8 F (36.6 C)  TempSrc:    Oral  SpO2: 100% 100% 100% 100%  Weight:      Height:        Intake/Output Summary (Last 24 hours) at 03/29/2023 0807 Last data filed at 03/29/2023 0518 Gross per 24 hour  Intake 1056.95 ml  Output 600 ml  Net 456.95 ml   Filed Weights   03/26/23 0318 03/27/23 0500 03/28/23 0302  Weight: 49.7 kg 47.7 kg 44.8 kg    Examination:  General exam: Appears calm and comfortable, upper extremity and upper body tremors as usual Respiratory system: Faint rhonchi, much improved.  Respiratory effort normal. Cardiovascular system: S1 & S2 heard, RRR. No JVD, murmurs, rubs, gallops or clicks. No pedal edema. Gastrointestinal system: Abdomen is nondistended, soft and nontender. No organomegaly or masses felt. Normal bowel sounds heard. Central nervous system: Alert and oriented. No  focal neurological deficits. Extremities: Symmetric 5 x 5 power. Skin: No rashes, lesions or ulcers.  Psychiatry: Judgement and insight appear normal.     Data Reviewed: I have personally reviewed following labs and imaging studies  CBC: Recent Labs  Lab 03/23/23 1700 03/24/23 0545 03/25/23 0500 03/26/23 0500 03/27/23 0530 03/28/23 0430 03/29/23 0500  WBC 10.7* 9.9 10.0 10.4 11.7* 9.7 9.7  NEUTROABS 8.7* 7.5 7.7  --   --   --   --   HGB 9.3* 9.1* 9.0* 9.3* 9.9* 10.3* 10.3*  HCT 28.7* 28.2* 29.3* 29.9* 31.1* 31.1* 32.8*  MCV 91.7 92.5 94.2 93.1 91.5 89.9 90.9  PLT 313 294 302 337 385 398 470*   Basic Metabolic Panel: Recent Labs  Lab 03/23/23 0405 03/24/23 0545 03/25/23 0500 03/26/23 0500 03/27/23 0530 03/28/23 0907 03/28/23 1058  NA 138 137 137 138 140 137  --   K 3.3* 3.4* 4.4 3.4* 3.7 3.2*  --   CL 104 105 108 103 100 91*  --   CO2 21* 24 21* 26 28 29   --   GLUCOSE 161* 165* 162* 118* 120* 167*  --   BUN 56* 46* 37* 32* 30* 34*  --   CREATININE 1.91* 1.59* 1.67* 1.58* 1.60* 1.67*  --   CALCIUM 6.4* 6.8* 7.0* 7.0* 7.1* 6.6*  --   MG 2.3 2.1  --   --   --   --  1.1*  PHOS 5.7* 3.9  --   --   --   --   --    GFR: Estimated Creatinine Clearance: 20.3 mL/min (A) (by C-G formula based on SCr of 1.67 mg/dL (H)). Liver Function Tests: Recent Labs  Lab 03/23/23 0405 03/24/23 0545  ALBUMIN 2.4* 2.3*   No results for input(s): "LIPASE", "AMYLASE" in the last 168 hours. No results for input(s): "AMMONIA" in the last 168 hours. Coagulation Profile: No results for input(s): "INR", "PROTIME" in the last 168 hours. Cardiac Enzymes: No results for input(s): "CKTOTAL", "CKMB", "CKMBINDEX", "TROPONINI" in the last 168 hours. BNP (last 3 results) No results for input(s): "PROBNP" in the last 8760 hours. HbA1C: No results for input(s): "HGBA1C" in the last 72 hours. CBG: Recent Labs  Lab 03/28/23 0750 03/28/23 1154 03/28/23 1623  GLUCAP 142* 93 112*   Lipid  Profile: No results for input(s): "CHOL", "HDL", "LDLCALC", "TRIG", "CHOLHDL", "LDLDIRECT" in the last 72 hours. Thyroid Function Tests: No results for input(s): "TSH", "T4TOTAL", "FREET4", "T3FREE", "THYROIDAB" in the last 72 hours.  Anemia Panel: No results for input(s): "VITAMINB12", "FOLATE", "FERRITIN", "TIBC", "IRON", "RETICCTPCT" in the last 72 hours.  Sepsis Labs: Recent Labs  Lab 03/25/23 0933 03/27/23 0530  PROCALCITON 0.33 0.27    Recent Results (from the past 240 hours)  Resp panel by RT-PCR (RSV, Flu A&B, Covid) Anterior Nasal Swab     Status: None   Collection Time: 03/20/23  2:00 PM   Specimen: Anterior Nasal Swab  Result Value Ref Range Status   SARS Coronavirus 2 by RT PCR NEGATIVE NEGATIVE Final   Influenza A by PCR NEGATIVE NEGATIVE Final   Influenza B by PCR NEGATIVE NEGATIVE Final    Comment: (NOTE) The Xpert Xpress SARS-CoV-2/FLU/RSV plus assay is intended as an aid in the diagnosis of influenza from Nasopharyngeal swab specimens and should not be used as a sole basis for treatment. Nasal washings and aspirates are unacceptable for Xpert Xpress SARS-CoV-2/FLU/RSV testing.  Fact Sheet for Patients: BloggerCourse.com  Fact Sheet for Healthcare Providers: SeriousBroker.it  This test is not yet approved or cleared by the [place] FDA and has been authorized for detection and/or diagnosis of SARS-CoV-2 by FDA under an Emergency Use Authorization (EUA). This EUA will remain in effect (meaning this test can be used) for the duration of the COVID-19 declaration under Section 564(b)(1) of the Act, 21 U.S.C. section 360bbb-3(b)(1), unless the authorization is terminated or revoked.  Resp Syncytial Virus by PCR NEGATIVE NEGATIVE Final    Comment: (NOTE) Fact Sheet for Patients: BloggerCourse.com  Fact Sheet for Healthcare  Providers: SeriousBroker.it  This test is not yet approved or cleared by the [place] FDA and has been authorized for detection and/or diagnosis of SARS-CoV-2 by FDA under an Emergency Use Authorization (EUA). This EUA will remain in effect (meaning this test can be used) for the duration of the COVID-19 declaration under Section 564(b)(1) of the Act, 21 U.S.C. section 360bbb-3(b)(1), unless the authorization is terminated or revoked.  Performed at [facility], [address], [place], [place] [address]   MRSA Next Gen by PCR, Nasal     Status: None   Collection Time: 03/25/23 11:29 PM   Specimen: Nasal Mucosa; Nasal Swab  Result Value Ref Range Status   MRSA by PCR Next Gen NOT DETECTED NOT DETECTED Final    Comment: (NOTE) The GeneXpert MRSA Assay (FDA approved for NASAL specimens only), is one component of a comprehensive MRSA colonization surveillance program. It is not intended to diagnose MRSA infection nor to guide or monitor treatment for MRSA infections. Test performance is not FDA approved in patients less than 5 years old. Performed at [facility], [address], [place], [place] [address]      Radiology Studies: No results found.    Scheduled Meds:  amiodarone  200 mg Oral BID   apixaban  2.5 mg Oral BID   atorvastatin  80 mg Oral Daily   carvedilol  3.125 mg Oral BID WC   Chlorhexidine Gluconate Cloth  6 each Topical Daily   [START ON 03/30/2023] digoxin  0.125 mg Intravenous Daily   digoxin  0.25 mg Oral Once   feeding supplement  237 mL Oral BID BM   levETIRAcetam  500 mg Oral BID   levothyroxine  75 mcg Oral Q0600   methylPREDNISolone (SOLU-MEDROL) injection  40 mg Intravenous Q12H   pantoprazole  40 mg Oral BID   polyethylene glycol  17 g Oral Daily   senna-docusate  1 tablet Oral BID   sodium chloride flush  10-40 mL Intracatheter Q12H   Continuous Infusions:     LOS: 9 days   Hughie Closs,  MD [facility]  Total time spent: 50 minutes 03/29/2023, 8:07 AM   *Please note that this is a verbal dictation therefore any spelling or grammatical errors are due to the "Dragon Medical One" system interpretation.  Please page via Amion and do not message via secure chat for urgent patient care matters. Secure chat can be used for non urgent patient care matters.  How to contact the [facility] Attending or Consulting provider 7A - 7P or covering provider during after hours 7P -7A, for this patient?  Check the care team in [facility] and look for a) attending/consulting [facility] provider listed and b) the [facility] team listed. Page or secure chat 7A-7P. Log into [email or web address] and use [place]'s universal password to access. If you do not have the password, please contact the hospital operator. Locate the [facility] provider you are looking for under [facility] and page to a number that you can be directly reached. If you still have difficulty reaching the provider, please page the [facility] (Director on Call) for the Hospitalists listed on amion for assistance.

## 2023-03-30 DIAGNOSIS — I509 Heart failure, unspecified: Secondary | ICD-10-CM | POA: Diagnosis not present

## 2023-03-30 DIAGNOSIS — R638 Other symptoms and signs concerning food and fluid intake: Secondary | ICD-10-CM

## 2023-03-30 DIAGNOSIS — Z515 Encounter for palliative care: Secondary | ICD-10-CM | POA: Diagnosis not present

## 2023-03-30 DIAGNOSIS — I4819 Other persistent atrial fibrillation: Secondary | ICD-10-CM | POA: Diagnosis not present

## 2023-03-30 DIAGNOSIS — Z66 Do not resuscitate: Secondary | ICD-10-CM | POA: Diagnosis not present

## 2023-03-30 DIAGNOSIS — I5033 Acute on chronic diastolic (congestive) heart failure: Secondary | ICD-10-CM | POA: Diagnosis not present

## 2023-03-30 MED ORDER — CHLORHEXIDINE GLUCONATE CLOTH 2 % EX PADS
6.0000 | MEDICATED_PAD | Freq: Every day | CUTANEOUS | Status: DC
  Administered 2023-03-30: 6 via TOPICAL

## 2023-03-30 MED ORDER — BIOTENE DRY MOUTH MT LIQD
15.0000 mL | Freq: Two times a day (BID) | OROMUCOSAL | Status: DC
  Administered 2023-03-30 – 2023-04-01 (×4): 15 mL via OROMUCOSAL

## 2023-03-30 NOTE — Plan of Care (Signed)
  Problem: Education: Goal: Knowledge of General Education information will improve Description: Including pain rating scale, medication(s)/side effects and non-pharmacologic comfort measures Outcome: Progressing   Problem: Health Behavior/Discharge Planning: Goal: Ability to manage health-related needs will improve Outcome: Not Progressing   Problem: Clinical Measurements: Goal: Ability to maintain clinical measurements within normal limits will improve Outcome: Progressing Goal: Will remain free from infection Outcome: Progressing Goal: Diagnostic test results will improve Outcome: Progressing Goal: Respiratory complications will improve Outcome: Progressing Goal: Cardiovascular complication will be avoided Outcome: Progressing   Problem: Activity: Goal: Risk for activity intolerance will decrease Outcome: Progressing   Problem: Nutrition: Goal: Adequate nutrition will be maintained Outcome: Not Progressing   Problem: Coping: Goal: Level of anxiety will decrease Outcome: Not Progressing   Problem: Elimination: Goal: Will not experience complications related to bowel motility Outcome: Progressing Goal: Will not experience complications related to urinary retention Outcome: Progressing   Problem: Pain Managment: Goal: General experience of comfort will improve and/or be controlled Outcome: Progressing   Problem: Safety: Goal: Ability to remain free from injury will improve Outcome: Progressing   Problem: Skin Integrity: Goal: Risk for impaired skin integrity will decrease Outcome: Progressing   Problem: Education: Goal: Ability to demonstrate management of disease process will improve Outcome: Progressing Goal: Ability to verbalize understanding of medication therapies will improve Outcome: Progressing Goal: Individualized Educational Video(s) Outcome: Progressing   Problem: Activity: Goal: Capacity to carry out activities will improve Outcome: Not  Progressing   Problem: Cardiac: Goal: Ability to achieve and maintain adequate cardiopulmonary perfusion will improve Outcome: Progressing   Problem: Clinical Measurements: Goal: Ability to avoid or minimize complications of infection will improve Outcome: Progressing   Problem: Skin Integrity: Goal: Skin integrity will improve Outcome: Progressing

## 2023-03-30 NOTE — Plan of Care (Signed)
  Problem: Clinical Measurements: Goal: Ability to maintain clinical measurements within normal limits will improve Outcome: Not Progressing   Problem: Activity: Goal: Risk for activity intolerance will decrease Outcome: Not Progressing   Problem: Nutrition: Goal: Adequate nutrition will be maintained Outcome: Not Progressing   Problem: Pain Managment: Goal: General experience of comfort will improve and/or be controlled Outcome: Progressing   Problem: Safety: Goal: Ability to remain free from injury will improve Outcome: Progressing

## 2023-03-30 NOTE — Progress Notes (Addendum)
 Progress Note  Patient Name: [name] Date of Encounter: 03/30/2023  Primary Cardiologist: Armanda Magic, MD  Subjective   Denies CP or SOB. Only really interacts if asked direct questions (A+Ox3) Dr. Ludwig Clarks note yesterday outlines plan to transition to comfort approach  Inpatient Medications    Scheduled Meds:  amiodarone  200 mg Oral BID   apixaban  2.5 mg Oral BID   carvedilol  3.125 mg Oral BID WC   Chlorhexidine Gluconate Cloth  6 each Topical Daily   levETIRAcetam  500 mg Oral BID   levothyroxine  75 mcg Oral Q0600   senna-docusate  1 tablet Oral BID   sodium chloride flush  10-40 mL Intracatheter Q12H   Continuous Infusions:  PRN Meds: acetaminophen **OR** acetaminophen, ALPRAZolam, feeding supplement, glycopyrrolate **OR** glycopyrrolate **OR** glycopyrrolate, HYDROcodone-acetaminophen, levalbuterol, loperamide, morphine injection, nitroGLYCERIN, ondansetron **OR** ondansetron (ZOFRAN) IV, polyethylene glycol, polyvinyl alcohol, sodium chloride flush   Vital Signs    Vitals:   03/29/23 0815 03/29/23 1147 03/29/23 1950 03/29/23 2000  BP: 118/75 103/63 108/79   Pulse: 99  87 (!) 110  Resp: 19 18 16    Temp: 97.9 F (36.6 C) 97.9 F (36.6 C) 97.8 F (36.6 C)   TempSrc: Oral Oral Oral   SpO2: 99% 97% 96% 95%  Weight:      Height:        Intake/Output Summary (Last 24 hours) at 03/30/2023 0837 Last data filed at 03/30/2023 4098 Gross per 24 hour  Intake 138.67 ml  Output 800 ml  Net -661.33 ml      03/28/2023    3:02 AM 03/27/2023    5:00 AM 03/26/2023    3:18 AM  Last 3 Weights  Weight (lbs) 98 lb 12.3 oz 105 lb 2.6 oz 109 lb 9.1 oz  Weight (kg) 44.8 kg 47.7 kg 49.7 kg     Telemetry    No longer on tele - Personally Reviewed  Physical Exam   GEN: No acute distress. Frail appearing HEENT: Normocephalic, atraumatic, sclera non-icteric. Neck: No JVD or bruits. Cardiac: irregularly irregular, rate ULN, no murmurs, rubs, or gallops.   Respiratory: Coarse diminished BS to auscultation bilaterally. Breathing is unlabored. GI: Soft, nontender, non-distended, BS +x 4. MS: no deformity. Extremities: No clubbing or cyanosis. No edema, LE Wrapped. Distal pedal pulses are 2+ and equal bilaterally. Neuro:  AAOx3. Follows commands. Psych: Flat affect, fatigue appearing  Labs    High Sensitivity Troponin:   Recent Labs  Lab 03/20/23 1336 03/20/23 1545 03/28/23 1058 03/28/23 1406  TROPONINIHS 72* 88* 16 18*      Cardiac EnzymesNo results for input(s): "TROPONINI" in the last 168 hours. No results for input(s): "TROPIPOC" in the last 168 hours.   Chemistry Recent Labs  Lab 03/24/23 0545 03/25/23 0500 03/27/23 0530 03/28/23 0907 03/29/23 0500  NA 137   < > 140 137 138  K 3.4*   < > 3.7 3.2* 3.7  CL 105   < > 100 91* 92*  CO2 24   < > 28 29 32  GLUCOSE 165*   < > 120* 167* 149*  BUN 46*   < > 30* 34* 44*  CREATININE 1.59*   < > 1.60* 1.67* 1.65*  CALCIUM 6.8*   < > 7.1* 6.6* 6.3*  ALBUMIN 2.3*  --   --   --   --   GFRNONAA 33*   < > 33* 32* 32*  ANIONGAP 8   < > 12 17* 14   < > =  values in this interval not displayed.     Hematology Recent Labs  Lab 03/27/23 0530 03/28/23 0430 03/29/23 0500  WBC 11.7* 9.7 9.7  RBC 3.40* 3.46* 3.61*  HGB 9.9* 10.3* 10.3*  HCT 31.1* 31.1* 32.8*  MCV 91.5 89.9 90.9  MCH 29.1 29.8 28.5  MCHC 31.8 33.1 31.4  RDW 13.9 13.7 13.6  PLT 385 398 470*    BNP Recent Labs  Lab 03/25/23 0933 03/27/23 0530 03/29/23 0500  BNP 1,201.7* 876.3* 676.0*     DDimer No results for input(s): "DDIMER" in the last 168 hours.   Radiology    No results found.  Cardiac Studies   2d echo 03/21/23   1. Left ventricular ejection fraction, by estimation, is 60 to 65%. The  left ventricle has normal function. The left ventricle has no regional  wall motion abnormalities. There is moderate concentric left ventricular  hypertrophy. Left ventricular  diastolic parameters are  indeterminate.   2. Right ventricular systolic function is moderately reduced. The right  ventricular size is moderately enlarged.   3. Left atrial size was moderately dilated.   4. Right atrial size was severely dilated.   5. The mitral valve is degenerative. Mild mitral valve regurgitation.  Moderate mitral annular calcification.   6. Tricuspid valve regurgitation is mild to moderate.   7. Suspect moderate low-flow/low-gradient AS. Marland Kitchen The aortic valve is  tricuspid. There is moderate calcification of the aortic valve. Aortic  valve regurgitation is not visualized. Moderate aortic valve stenosis.  Aortic valve area, by VTI measures 1.10  cm. Aortic valve mean gradient measures 8.0 mmHg. Aortic valve Vmax  measures 1.88 m/s.   8. The inferior vena cava is dilated in size with <50% respiratory  variability, suggesting right atrial pressure of 15 mmHg.    Patient Profile     78 y.o. female with chronic HFpEF, CAD s/p multiple interventions (DES to RCA in 2011 for MI, DES to RCA on 06/2010, DES to LAD in 2014), hypertension, hyperlipidemia, aortic stenosis, esophageal strictures s/p dilation, GERD, hypothyroidism, CKD stage 3b, 2017 VATS with chronic bronchiolitis without ILD, DVT in 2019, pericardial cyst s/p resection. She was admitted for worsening SOB And LE edema. CXR concerning for pulmonary edema versus PNA. hsTroponin mildly elevated. Noted to be in AF RVR, management limited by hypotension. Of note, chart mentions possible prior h/o ILD though she saw pulmonology (last in 2021) who obtained CT scanning and did not feel this was the case. Chronic respiratory failure also listed but pt not on home O2 prior to admission. Echo showed EF 60-65%, mild MR, mild-moderate TR, moderate low-flow/low-gradient AS, dilated IVC.   Assessment & Plan    1. Acute on chronic HFpEF with new RV dysfunction, acute respiratory failure with hypoxia, possible PNA vs ILD - periodically diuresed but limited by  soft BP - high res CT grossly abnormal: "this may simply represent severe multilobar bilateral bronchopneumonia. The possibility of some degree of pulmonary edema should also be considered. The possibility of interstitial lung disease is not excluded, but can not be accurately discerned on today's limited study. Repeat high-resolution chest CT should be considered in 3 months after resolution of the patient's acute illness to better characterize the true extent of non reversible disease." Trace volume ascites also noted- - echo this admission showed: EF 60-65%, moderate LVH, moderately reduced RV function, moderately enlarged RV, biatrial enlargement, mild MR, mild to moderate TR, low-flow, low-gradient AS (mean gradient 8 mmHg), dilated IVC  - do  not see w/u for PE pursued but poor candidate for CTA given elevated Cr and VQ given grossly abnormal lung scan; patient planned for comfort care approach so reasonable to continue OAC if aligns with GOC - restarted on IV Lasix 3/15 - rapid called that day due to respiratory distress/hypoxia requiring BiPAP and additional IV Lasix, last given 3/17 - notes outline from primary plan to transition toward comfort care/hospice approach - will discuss med recs with MD to align with this plan  2. New onset persistent atrial fibrillation with RVR - TSH elevated at 6.3, free T4 elevated 1.48 -- known history of hypothyroidism, currently on synthroid  - Hemoccult was negative so less suspicious for GI bleed -- ordered anemia panel to be drawn this AM - treated with IV amiodarone earlier this admission then transitioned to oral amiodarone 200mg  BID + carvedilol 3.125mg  BID, episodic digoxin given - Dr. Servando Salina deferred TEE/DCCV, issues with anemia, hypotension, respiratory failure noted - will discuss med mgmt with MD, consider changing carvedilol to metoprolol for less BP effect   3. Valvular heart disease - mild MR, mild-moderate TR, moderate LF/LG AS - being managed  medically at this time  4. CAD s/p DES to RCA 2011, DES to RCA 06/2010, DES to LAD 2014, Hyperlipidemia  - mildly elevated troponin felt possibly due to demand ischemia - rosuvastatin changed to atorvastatin given CrCl - ASA discontinued due to bleeding risk and concomitant Eliquis    5. AKI on CKD 3b - prior baseline variable, but last ~1.4 in 11/2022 - appears relatively stable  Per primary Hypothyroidism with abnormal TFTs Possible cellulitis GERD Normocytic anemia - FOBT negative Seizure disorder  Acute metabolic encephalopathy Urinary retention Electrolyte disturbances - hypomagnesemia, hypocalcemia  For questions or updates, please contact [facility] Please consult [email or web address] for contact info under Cardiology/STEMI.  Signed, Laurann Montana, PA-C 03/30/2023, 8:37 AM    Patient examined chart reviewed afib rates acceptable. I/O's negative mandibular tremor, basilar crackles SEM  prior sternotomy from pericardial cyst removal. legs wrapped. Continue low dose coreg, amiodarone and digoxin on low dose eliquis appropriate. No plans for [facility] note DNP/comfort care in Epic  Charlton Haws MD [facility]

## 2023-03-30 NOTE — Progress Notes (Signed)
 PROGRESS NOTE    [name]  GEX:[id number] DOB: 03/16/1945 DOA: 03/20/2023 PCP: Merri Brunette, MD   Brief Narrative:  Patient with PMH of CAD with PCI to RCA, recent cath 9/23 with nonobstructive CAD and patent stents, mild aortic stenosis, echo EF 6065% in 2023, HTN, anxiety, chronic pain syndrome, GERD, hypothyroidism, HLD, ILD, CKD 3A presented to the hospital with complaints of leg swelling and shortness of breath. Went to see PCP in 2/27 there was some trace edema noted but no significant shortness of breath.   niece found the patient with very short of breath and therefore patient was brought to the hospital.  Treated for CHF and atrial fibrillation and interstitial lung disease.  Eventually transitioned to comfort care on 03/29/2023.  Currently pending placement to [facility].  Assessment & Plan:   Principal Problem:   Acute on chronic diastolic (congestive) heart failure (HCC) Active Problems:   Hypertension   Hyperlipidemia   Hypothyroidism   History of  seizures   Depression   CAD in native artery   GERD   IBS   ILD (interstitial lung disease) (HCC)   Mild aortic stenosis   Chronic pain syndrome   Chronic kidney disease, stage 3a (HCC)  Acute hypoxic respiratory failure secondary to acute on chronic diastolic CHF: Upon admission, BNP significantly elevated 1056.  Significant swelling in the leg.  Tripoding requiring BiPAP therapy and NRB in the ER. Chest x-ray shows significant vascular congestion.  There is comment about pneumonia although pneumonia is less likely. Echocardiogram 60 to 65% in September 2023. Last cath 9/23 40% stenosis and RPDA and second diagonal, otherwise nonobstructive CAD with patent stent in RCA. Continue BiPAP as needed.  Cardiology following and managing.  Diuretics were held for few days due to rising creatinine but she was started on Lasix 40 mg IV daily on 03/26/2023.  However in the afternoon that day Around 1:45 PM on 03/26/2023, rapid  response was called due to shortness of breath and increasing oxygen demand.  Patient was noted to have crackles.  Chest x-ray ordered, patient was started on Amio drip.  Patient had already received Lasix in the morning.  Another dose of Lasix was given.  Followed by this, the nurse called CODE BLUE on this patient around 2:30 PM while the patient was breathing and there was no loss of pulse.  CODE BLUE team arrived at the bedside and found patient sitting in bed, has pulse, able to respond. She on BIPAP @ bedside due to desaturation.  BiPAP was removed and she was placed on 10 L salter.  Due to continued high amount of oxygen requirement and rails, interstitial lung disease/pneumonitis was considered to be one of the cause for her current condition so I started her on Solu-Medrol on 03/27/2023.  Patient improved, oxygen weaned to 2 L.  AKI on CKD stage IIIb: Baseline creatinine appears to be around 1.3-1.5, peaked at 1.91 but improved to 1.6 on 03/24/2023 and has been stable.  Hypokalemia: Low again, Will replenish.  Acute anemia: Almost 3 months ago, patient's hemoglobin was normal.  She presents with 10.6 which gradually dropped to 8.6 but improved to 9.3 without any transfusion.  No reports of hematemesis, melena or hematochezia.  Cardiology concerned about GI bleed and ordered FOBT which was negative.    History of SVT but here with paroxysmal atrial fibrillation with RVR. Patient has history of SVT and is on Cardizem PTA. Cardizem was on hold on admission to allow room for  diuresis. Found to have A-fib with RVR early in the morning of 3/10.  Patient has been on and off amiodarone drip, cardiology has discontinued IV and started on oral amiodarone and she was started on Coreg and digoxin on 03/28/2023.  Rates better controlled.  To focus on the comfort care, palliative care discontinued her Coreg but continued amiodarone and Eliquis.   Acute urinary retention. Was unable to urinate on her own with  bladder scan showing 400+ cc.  Placed Foley catheter which was eventually discontinued on 03/25/2023 and patient has had many successful voiding trials.   Possible cellulitis. Patient was presumed to have cellulitis of bilateral legs based on appearance.  She was started on cefazolin.  On my examination, I did not see any erythema, no warmth or tenderness.  Looks like antibiotics were started on 03/20/2023, will continue for total of 5 to 7 days.   History of CAD and mild aortic stenosis/elevated troponin. Last 2023.  Nonobstructive CAD with patent stent.Troponins minimally elevated but EKG does not show any evidence of acute ischemia.  Echo shows preserved EF.  No significant wall abnormality.  Likely demand ischemia.  Hypertension: Blood pressure on the low side.  All antihypertensives on hold.   Acute metabolic encephalopathy. Likely in the setting of hypotension. Per previous hospitalist documentation  "blood pressure corrected with midodrine with improvement in mentation".  However I did not see any midodrine ordered.  Patient is alert and oriented, needs some prompts though.   Poor venous access. RN requested a PICC line.  Placed.    GERD. Continue PPI.   Hypothyroidism. Continuing Synthroid.  Both TSH and free T4 elevated.  I reduced Synthroid from 88 mcg to 75 mcg starting 03/24/2023.  Repeat TSH in 6 to 8 weeks.   History of seizures. Continuing Keppra.  Switching to oral.   Depression. On Cymbalta, will resume tomorrow.   HLD. Continuing statin.   Goals of care conversation. Previous hospitalist discussed with the patient as well as in presence of niece.  Patient wanted to be DNR however per documentation/CODE STATUS order, she would like to be intubated if needed.  Patient herself did not appear to have the understanding of CODE STATUS at the moment so on 03/27/2023 I had a lengthy discussion with the patient's [facility] POA/niece [name] and she clarified that there may  have been some misunderstanding but patient is supposed to be full DNR with no intubation and no CPR.  I changed the CODE STATUS after talking to her.  We then talked in length about patient's poor prognosis and that I am concerned that patient may not do well during this hospitalization.  The niece was very much understanding of this as patient's husband went through hospitalization and eventually transition to comfort care a year ago or so.  She was very much accepting of that.    Palliative care consulted.  Awaiting evaluation.  However today patient said " I am ready to go" and I did clarify with her and she clearly said that she is ready to go meet the guard.  We also discussed if she would like for [place] to discontinue life-saving medications and focus on comfort care only and to that, she agreed.  I then spoke to the niece [name] again 03/29/2023.  She verbalized that the patient has been saying this for last 2 days and she brought other family members to meet her as well and patient basically said goodbye to them.  Samara Deist believes that patient  has made her mind and she is ready for [place] to transition her to comfort care and she is also on the same page so with her agreement, patient was transitioned to comfort care on 03/29/2023.  Seen by palliative care.   Prolonged QTc. Minimize QT prolonging medications.  At risk of further worsening in the setting of amiodarone use.  Monitor and EKG and telemetry.   DVT prophylaxis: apixaban (ELIQUIS) tablet 2.5 mg Start: 03/25/23 1045Heparin   Code Status: Do not attempt resuscitation (DNR) - Comfort care  Family Communication:  None present at bedside.  Discussed with niece over the phone as detailed above.  Status is: Inpatient Remains inpatient appropriate because: Now comfort care.  Will benefit from transitioning to hospice facility.  Estimated body mass index is 19.29 kg/m as calculated from the following:   Height as of this encounter: 5' (1.524 m).    Weight as of this encounter: 44.8 kg.    Nutritional Assessment: Body mass index is 19.29 kg/m.Marland Kitchen Seen by dietician.  I agree with the assessment and plan as outlined below: Nutrition Status:        . Skin Assessment: I have examined the patient's skin and I agree with the wound assessment as performed by the wound care RN as outlined below:    Consultants:  Cardiology  Procedures:  None  Antimicrobials:  Anti-infectives (From admission, onward)    Start     Dose/Rate Route Frequency Ordered Stop   03/20/23 2200  ceFAZolin (ANCEF) IVPB 1 g/50 mL premix        1 g 100 mL/hr over 30 Minutes Intravenous Every 12 hours 03/20/23 1754 03/23/23 2210         Subjective: Seen and examined this morning.  She had no complaints.  She felt at peace and comfortable.  She said that she was hungry.  I sent a message to the nurse to order food for her.  Objective: a Vitals:   03/29/23 0815 03/29/23 1147 03/29/23 1950 03/29/23 2000  BP: 118/75 103/63 108/79   Pulse: 99  87 (!) 110  Resp: 19 18 16    Temp: 97.9 F (36.6 C) 97.9 F (36.6 C) 97.8 F (36.6 C)   TempSrc: Oral Oral Oral   SpO2: 99% 97% 96% 95%  Weight:      Height:        Intake/Output Summary (Last 24 hours) at 03/30/2023 1305 Last data filed at 03/30/2023 4098 Gross per 24 hour  Intake 208.67 ml  Output 800 ml  Net -591.33 ml   Filed Weights   03/26/23 0318 03/27/23 0500 03/28/23 0302  Weight: 49.7 kg 47.7 kg 44.8 kg    Examination:  General exam: Appears calm and comfortable, usual upper body tremors Respiratory system: Clear to auscultation. Respiratory effort normal. Cardiovascular system: S1 & S2 heard, RRR. No JVD, murmurs, rubs, gallops or clicks. No pedal edema. Gastrointestinal system: Abdomen is nondistended, soft and nontender. No organomegaly or masses felt. Normal bowel sounds heard. Central nervous system: Alert and oriented. No focal neurological deficits. Extremities: Symmetric 5 x 5  power. Skin: No rashes, lesions or ulcers.   Data Reviewed: I have personally reviewed following labs and imaging studies  CBC: Recent Labs  Lab 03/23/23 1700 03/24/23 0545 03/25/23 0500 03/26/23 0500 03/27/23 0530 03/28/23 0430 03/29/23 0500  WBC 10.7* 9.9 10.0 10.4 11.7* 9.7 9.7  NEUTROABS 8.7* 7.5 7.7  --   --   --   --   HGB 9.3* 9.1* 9.0*  9.3* 9.9* 10.3* 10.3*  HCT 28.7* 28.2* 29.3* 29.9* 31.1* 31.1* 32.8*  MCV 91.7 92.5 94.2 93.1 91.5 89.9 90.9  PLT 313 294 302 337 385 398 470*   Basic Metabolic Panel: Recent Labs  Lab 03/24/23 0545 03/25/23 0500 03/26/23 0500 03/27/23 0530 03/28/23 0907 03/28/23 1058 03/29/23 0500  NA 137 137 138 140 137  --  138  K 3.4* 4.4 3.4* 3.7 3.2*  --  3.7  CL 105 108 103 100 91*  --  92*  CO2 24 21* 26 28 29   --  32  GLUCOSE 165* 162* 118* 120* 167*  --  149*  BUN 46* 37* 32* 30* 34*  --  44*  CREATININE 1.59* 1.67* 1.58* 1.60* 1.67*  --  1.65*  CALCIUM 6.8* 7.0* 7.0* 7.1* 6.6*  --  6.3*  MG 2.1  --   --   --   --  1.1*  --   PHOS 3.9  --   --   --   --   --   --    GFR: Estimated Creatinine Clearance: 20.5 mL/min (A) (by C-G formula based on SCr of 1.65 mg/dL (H)). Liver Function Tests: Recent Labs  Lab 03/24/23 0545  ALBUMIN 2.3*   No results for input(s): "LIPASE", "AMYLASE" in the last 168 hours. No results for input(s): "AMMONIA" in the last 168 hours. Coagulation Profile: No results for input(s): "INR", "PROTIME" in the last 168 hours. Cardiac Enzymes: No results for input(s): "CKTOTAL", "CKMB", "CKMBINDEX", "TROPONINI" in the last 168 hours. BNP (last 3 results) No results for input(s): "PROBNP" in the last 8760 hours. HbA1C: No results for input(s): "HGBA1C" in the last 72 hours. CBG: Recent Labs  Lab 03/28/23 0750 03/28/23 1154 03/28/23 1623  GLUCAP 142* 93 112*   Lipid Profile: No results for input(s): "CHOL", "HDL", "LDLCALC", "TRIG", "CHOLHDL", "LDLDIRECT" in the last 72 hours. Thyroid Function  Tests: No results for input(s): "TSH", "T4TOTAL", "FREET4", "T3FREE", "THYROIDAB" in the last 72 hours.  Anemia Panel: No results for input(s): "VITAMINB12", "FOLATE", "FERRITIN", "TIBC", "IRON", "RETICCTPCT" in the last 72 hours.  Sepsis Labs: Recent Labs  Lab 03/25/23 0933 03/27/23 0530  PROCALCITON 0.33 0.27    Recent Results (from the past 240 hours)  Resp panel by RT-PCR (RSV, Flu A&B, Covid) Anterior Nasal Swab     Status: None   Collection Time: 03/20/23  2:00 PM   Specimen: Anterior Nasal Swab  Result Value Ref Range Status   SARS Coronavirus 2 by RT PCR NEGATIVE NEGATIVE Final   Influenza A by PCR NEGATIVE NEGATIVE Final   Influenza B by PCR NEGATIVE NEGATIVE Final    Comment: (NOTE) The Xpert Xpress SARS-CoV-2/FLU/RSV plus assay is intended as an aid in the diagnosis of influenza from Nasopharyngeal swab specimens and should not be used as a sole basis for treatment. Nasal washings and aspirates are unacceptable for Xpert Xpress SARS-CoV-2/FLU/RSV testing.  Fact Sheet for Patients: BloggerCourse.com  Fact Sheet for Healthcare Providers: SeriousBroker.it  This test is not yet approved or cleared by the [place] FDA and has been authorized for detection and/or diagnosis of SARS-CoV-2 by FDA under an Emergency Use Authorization (EUA). This EUA will remain in effect (meaning this test can be used) for the duration of the COVID-19 declaration under Section 564(b)(1) of the Act, 21 U.S.C. section 360bbb-3(b)(1), unless the authorization is terminated or revoked.     Resp Syncytial Virus by PCR NEGATIVE NEGATIVE Final    Comment: (NOTE) Fact Sheet for  Patients: BloggerCourse.com  Fact Sheet for Healthcare Providers: SeriousBroker.it  This test is not yet approved or cleared by the [place] FDA and has been authorized for detection and/or diagnosis of  SARS-CoV-2 by FDA under an Emergency Use Authorization (EUA). This EUA will remain in effect (meaning this test can be used) for the duration of the COVID-19 declaration under Section 564(b)(1) of the Act, 21 U.S.C. section 360bbb-3(b)(1), unless the authorization is terminated or revoked.  Performed at [facility], [address], [place], [place] [address]   MRSA Next Gen by PCR, Nasal     Status: None   Collection Time: 03/25/23 11:29 PM   Specimen: Nasal Mucosa; Nasal Swab  Result Value Ref Range Status   MRSA by PCR Next Gen NOT DETECTED NOT DETECTED Final    Comment: (NOTE) The GeneXpert MRSA Assay (FDA approved for NASAL specimens only), is one component of a comprehensive MRSA colonization surveillance program. It is not intended to diagnose MRSA infection nor to guide or monitor treatment for MRSA infections. Test performance is not FDA approved in patients less than 67 years old. Performed at [facility], [address], [place], [place] [address]      Radiology Studies: No results found.    Scheduled Meds:  amiodarone  200 mg Oral BID   antiseptic oral rinse  15 mL Mouth Rinse BID   apixaban  2.5 mg Oral BID   levETIRAcetam  500 mg Oral BID   senna-docusate  1 tablet Oral BID   sodium chloride flush  10-40 mL Intracatheter Q12H   Continuous Infusions:     LOS: 10 days   Hughie Closs, MD [facility]  Total time spent: 50 minutes 03/30/2023, 1:05 PM   *Please note that this is a verbal dictation therefore any spelling or grammatical errors are due to the "Dragon Medical One" system interpretation.  Please page via Amion and do not message via secure chat for urgent patient care matters. Secure chat can be used for non urgent patient care matters.  How to contact the [facility] Attending or Consulting provider 7A - 7P or covering provider during after hours 7P -7A, for this patient?  Check the care team in [facility] and look for a)  attending/consulting [facility] provider listed and b) the [facility] team listed. Page or secure chat 7A-7P. Log into [email or web address] and use [facility] universal password to access. If you do not have the password, please contact the hospital operator. Locate the [facility] provider you are looking for under [facility] and page to a number that you can be directly reached. If you still have difficulty reaching the provider, please page the [facility] (Director on Call) for the Hospitalists listed on amion for assistance.

## 2023-03-30 NOTE — Progress Notes (Addendum)
 Daily Progress Note   Patient Name: [name]       Date: 03/30/2023 DOB: [birth year]  Age: 78 y.o. MRN#: [id number] Attending Physician: Hughie Closs, MD Primary Care Physician: Merri Brunette, MD Admit Date: 03/20/2023  Reason for Consultation/Follow-up: Non pain symptom management, Pain control, Psychosocial/spiritual support, and Terminal Care  Subjective: I have reviewed medical records including EPIC notes, MAR, any available advanced directives as necessary, and labs. Received report from primary RN -no acute concerns.  Continues with no to minimal oral intake (only excepting medications).  Went to visit patient at bedside - no family/visitors present.  Patient was lying in bed asleep - did not attempt to wake to preserve comfort. No signs or non-verbal gestures of pain or discomfort noted. No respiratory distress, increased work of breathing, or secretions noted.   9:42 AM Attempted to call niece/Katherine for ongoing support- no answer - confidential voicemail left and PMT phone number provided if she felt there were any needs.   Length of Stay: 10  Current Medications: Scheduled Meds:   amiodarone  200 mg Oral BID   apixaban  2.5 mg Oral BID   carvedilol  3.125 mg Oral BID WC   Chlorhexidine Gluconate Cloth  6 each Topical Daily   levETIRAcetam  500 mg Oral BID   levothyroxine  75 mcg Oral Q0600   senna-docusate  1 tablet Oral BID   sodium chloride flush  10-40 mL Intracatheter Q12H    Continuous Infusions:   PRN Meds: acetaminophen **OR** acetaminophen, ALPRAZolam, feeding supplement, glycopyrrolate **OR** glycopyrrolate **OR** glycopyrrolate, HYDROcodone-acetaminophen, levalbuterol, loperamide, morphine injection, nitroGLYCERIN, ondansetron **OR** ondansetron (ZOFRAN)  IV, polyethylene glycol, polyvinyl alcohol, sodium chloride flush  Physical Exam Vitals and nursing note reviewed.  Constitutional:      General: She is not in acute distress.    Appearance: She is ill-appearing.  Pulmonary:     Effort: No respiratory distress.  Skin:    General: Skin is warm and dry.  Neurological:     Mental Status: She is lethargic.     Motor: Weakness present.             Vital Signs: BP 108/79 (BP Location: Left Arm)   Pulse (!) 110   Temp 97.8 F (36.6 C) (Oral)   Resp 16   Ht 5' (1.524 m)  Wt 44.8 kg   SpO2 95%   BMI 19.29 kg/m  SpO2: SpO2: 95 % O2 Device: O2 Device: Nasal Cannula O2 Flow Rate: O2 Flow Rate (L/min): 2 L/min  Intake/output summary:  Intake/Output Summary (Last 24 hours) at 03/30/2023 0932 Last data filed at 03/30/2023 3664 Gross per 24 hour  Intake 88.67 ml  Output 800 ml  Net -711.33 ml   LBM: Last BM Date : 03/28/23 Baseline Weight: Weight: 49.9 kg Most recent weight: Weight: 44.8 kg       Palliative Assessment/Data: PPS 10-20%      Patient Active Problem List   Diagnosis Date Noted   Acute on chronic diastolic (congestive) heart failure (HCC) 03/20/2023   Chronic kidney disease, stage 3a (HCC) 03/20/2023   UTI (urinary tract infection) 11/18/2022   NSTEMI (non-ST elevated myocardial infarction) (HCC) 09/16/2021   Elevated troponin 09/15/2021   Stage 3b chronic kidney disease (CKD) (HCC) 09/15/2021   Partial seizure disorder (HCC)    DVT (deep venous thrombosis) (HCC) 08/19/2017   Orthostatic hypotension 08/18/2017   Diarrhea 08/18/2017   Mild aortic stenosis    Long-term current use of opiate analgesic 04/06/2017   Chronic low back pain 04/06/2017   Chronic neck pain 04/06/2017   Chronic pain syndrome 04/06/2017   Hyperphosphatemia 11/08/2016   Trigger finger, acquired 09/30/2015   Carpal tunnel syndrome, bilateral 07/31/2015   Abnormal CAT scan 05/04/2015   Breathlessness on exertion 05/04/2015    Abnormal chest CT 05/04/2015   Anemia in chronic kidney disease (CODE) 04/29/2015   Bone disease, metabolic 04/29/2015   Microscopic hematuria 04/29/2015   Avitaminosis D 04/29/2015   Normocytic anemia 03/01/2015   Syncope due to orthostatic hypotension 03/01/2015   Dehydration 03/01/2015   Hypomagnesemia 03/01/2015   Generalized weakness 02/23/2015   Hypokalemia 02/23/2015   AKI (acute kidney injury) (HCC) 02/23/2015   Acute bronchitis 02/23/2015   Hypocalcemia 02/23/2015   ILD (interstitial lung disease) (HCC) 01/24/2015   Chronic respiratory failure (HCC) 12/26/2014   Pulmonary fibrosis (HCC) 12/26/2014   Dyspnea and respiratory abnormality 11/22/2014   Abdominal pain 10/15/2014   Pain in the chest    RUQ abdominal pain    Shortness of breath 10/14/2014   Odynophagia    History of esophageal stricture    Esophageal dysmotility    Renal insufficiency 02/14/2014   Acute renal failure (HCC) 02/14/2014   Nephrolithiasis 02/13/2014   Gross hematuria 02/13/2014   Convulsions/seizures (HCC) 07/26/2013   Pericardial cyst    Mediastinal mass 02/05/2013   Cervical spondylosis 11/17/2011   Chest pain 11/16/2011   Hypertension    Hyperlipidemia    Hypothyroidism    History of  seizures    Depression    Carpal tunnel syndrome    Paroxysmal SVT (supraventricular tachycardia) (HCC) 01/02/2011   CAD in native artery    ANEMIA-UNSPECIFIED 12/12/2009   History of drug coated stent 12/01/2009   GERD 01/19/2008   IBS 01/19/2008    Palliative Care Assessment & Plan   Patient Profile: 78 y.o. female  with past medical history of CAD with PCI to RCA, recent cath 9/23 with nonobstructive CAD and patent stents, mild aortic stenosis, echo EF 60-65% in 2023, HTN, anxiety, chronic pain syndrome, GERD, hypothyroidism, HLD, ILD, CKD 3a was admitted on 03/20/2023 with acute on chronic diastolic CHF, acute urinary retention, possible cellulitis, elevated troponin.  After discussions with  attending, patient and family opted for patient's transition to full comfort care on 03/29/2023.  Of note, patient has had  2 admissions and 1 ED visit in the last 6 months  Assessment: Principal Problem:   Acute on chronic diastolic (congestive) heart failure (HCC) Active Problems:   CAD in native artery   GERD   IBS   Hypertension   Hyperlipidemia   Hypothyroidism   History of  seizures   Depression   ILD (interstitial lung disease) (HCC)   Mild aortic stenosis   Chronic pain syndrome   Chronic kidney disease, stage 3a (HCC)   Terminal care  Recommendations/Plan: Continue full comfort measures Continue DNR/DNI as previously documented Transfer to [facility] pending evaluation and bed availability Comfort focused medication regimen adjusted as noted below PMT will continue to follow and support holistically  Symptom Management Morphine PRN severe pain/dyspnea/increased work of breathing/RR>25 Norco PRN moderate pain Tylenol PRN pain/fever Biotin twice daily Robinul PRN secretions Haldol PRN agitation/delirium Ativan PRN anxiety/seizure/sleep/distress Zofran PRN nausea/vomiting Liquifilm Tears PRN dry eye Nitroglycerin PRN chest  Nebulizer PRN wheezing Discontinued coreg and Synthroid; anticipated BP will continue to decline and no short-term benefit of Synthroid Continue amiodarone, Xanax PRN, Eliquis, Keppra, Imodium PRN, MiraLAX PRN, Senokot for comfort while tolerating p.o.'s   Goals of Care and Additional Recommendations: Limitations on Scope of Treatment: Full Comfort Care  Code Status:    Code Status Orders  (From admission, onward)           Start     Ordered   03/29/23 1329  Do not attempt resuscitation (DNR) - Comfort care  Continuous       Question Answer Comment  If patient has no pulse and is not breathing Do Not Attempt Resuscitation   In Pre-Arrest Conditions (Patient Is Breathing and Has a Pulse) Provide comfort measures. Relieve any  mechanical airway obstruction. Avoid transfer unless required for comfort.   Consent: Discussion documented in EHR or advanced directives reviewed      03/29/23 1329           Code Status History     Date Active Date Inactive Code Status Order ID Comments User Context   03/27/2023 1116 03/29/2023 1329 Limited: Do not attempt resuscitation (DNR) -DNR-LIMITED -Do Not Intubate/DNI  [id number]  Hughie Closs, MD Inpatient   03/20/2023 1649 03/27/2023 1115 Do not attempt resuscitation (DNR) PRE-ARREST INTERVENTIONS DESIRED [id number]  Rolly Salter, MD ED   11/18/2022 1333 11/27/2022 1558 Limited: Do not attempt resuscitation (DNR) -DNR-LIMITED -Do Not Intubate/DNI  [id number]  Maryln Gottron, MD Inpatient   09/15/2021 2053 09/17/2021 1951 DNR [id number]  Margie Ege A, DO ED   09/12/2019 1234 09/12/2019 2027 Full Code [id number]  Corky Crafts, MD Inpatient   08/18/2017 1727 08/20/2017 2146 Full Code [id number]  Burnadette Pop, MD ED   03/01/2015 2105 03/03/2015 1651 Full Code [id number]  Briscoe Deutscher, MD ED   02/23/2015 2113 02/26/2015 1852 Full Code [id number]  Ron Parker, MD Inpatient   01/24/2015 1907 01/28/2015 1656 Full Code [id number]  Ardelle Balls, PA-C Inpatient   10/14/2014 2117 10/16/2014 1903 Partial Code [id number]  Lorretta Harp, MD ED   02/14/2014 1705 02/16/2014 2110 Full Code [id number]  Kathlen Mody, MD Inpatient   02/05/2013 1056 02/10/2013 1519 Full Code [id number]  Ardelle Balls, PA-C Inpatient   10/23/2012 2311 10/24/2012 2115 Full Code [id number]  Leeann Must, MD Inpatient   11/16/2011 1840 11/18/2011 1524 Full Code [id number]  Silvestre Gunner, RN Inpatient   01/03/2011 0513 01/03/2011 1934 Full Code [id number]  Thora Lance, RN Inpatient  Advance Directive Documentation    Flowsheet Row Most Recent Value  Type of Advance Directive Healthcare Power of Attorney, Living will  Pre-existing out of facility DNR order (yellow form or pink MOST form) --  "MOST" Form in  Place? --       Prognosis:  < 2 weeks  Discharge Planning: Hospice facility  Care plan was discussed with primary RN  Thank you for allowing the Palliative Medicine Team to assist in the care of this patient.     Haskel Khan, NP  Please contact Palliative Medicine Team phone at [phone] for questions and concerns.   *Portions of this note are a verbal dictation therefore any spelling and/or grammatical errors are due to the "[employer]agon [employer] One" system interpretation.

## 2023-03-30 NOTE — Progress Notes (Signed)
 [employer] engineer, contracting Hospice hospital liaison note   Received request from [facility] for family interest in hospice inpatient unit. Visited patient and reviewed chart this morning.    Currently Ms. Maxim would not be approved for inpatient hospice services. However, hospital liaisons will observe her tomorrow for signs of decline. She would also be very appropriate for hospice services and care in a LTC facility or at home.      Thank you for the opportunity to participate in this patient's care.    Glenna Fellows, BSN, RN, [facility] Hospice hospital liaison [phone]

## 2023-03-30 NOTE — Discharge Summary (Signed)
 Physician Discharge Summary  Jasmine [name] [name]:[id number] DOB: 07/02/1945 DOA: 03/20/2023  PCP: Merri Brunette, MD  Admit date: 03/20/2023 Discharge date: 03/31/2023 30 Day Unplanned Readmission Risk Score    Flowsheet Row ED to Hosp-Admission (Current) from 03/20/2023 in [facility]  30 Day Unplanned Readmission Risk Score (%) 31.8 Filed at 03/30/2023 1200       This score is the patient's risk of an unplanned readmission within 30 days of being discharged (0 -100%). The score is based on dignosis, age, lab data, medications, orders, and past utilization.   Low:  0-14.9   Medium: 15-21.9   High: 22-29.9   Extreme: 30 and above          Admitted From: Home Disposition: Hospice house  Discharge Condition: Guarded CODE STATUS: DNR Diet recommendation: As placed by patient.  Subjective: Seen and examined this morning.  Patient much more comfortable today.  No complaints.  Brief/Interim Summary: Patient with PMH of CAD with PCI to RCA, recent cath 9/23 with nonobstructive CAD and patent stents, mild aortic stenosis, echo EF 6065% in 2023, HTN, anxiety, chronic pain syndrome, GERD, hypothyroidism, HLD, ILD, CKD 3A presented to the hospital with complaints of leg swelling and shortness of breath. Went to see PCP in 2/27 there was some trace edema noted but no significant shortness of breath.  niece found the patient with very short of breath and therefore patient was brought to the hospital.  Details below.   Acute hypoxic respiratory failure secondary to acute on chronic diastolic CHF: Upon admission, BNP significantly elevated 1056.  Significant swelling in the leg.  Tripoding requiring BiPAP therapy and NRB in the ER. Chest x-ray shows significant vascular congestion.  There is comment about pneumonia although pneumonia is less likely. Echocardiogram 60 to 65% in September 2023. Last cath 9/23 40% stenosis and RPDA and second diagonal, otherwise nonobstructive CAD with  patent stent in RCA. Continue BiPAP as needed.  Cardiology following and managing.  Diuretics were held for few days due to rising creatinine but she was started on Lasix 40 mg IV daily on 03/26/2023.  However in the afternoon that day Around 1:45 PM on 03/26/2023, rapid response was called due to shortness of breath and increasing oxygen demand.  Patient was noted to have crackles.  Chest x-ray ordered, patient was started on Amio drip.  Patient had already received Lasix in the morning.  Another dose of Lasix was given.  Followed by this, the nurse called CODE BLUE on this patient around 2:30 PM while the patient was breathing and there was no loss of pulse.  CODE BLUE team arrived at the bedside and found patient sitting in bed, has pulse, able to respond. She on BIPAP @ bedside due to desaturation.  BiPAP was removed and she was placed on 10 L salter.  Due to continued high amount of oxygen requirement and rails, interstitial lung disease/pneumonitis was considered to be one of the cause for her current condition so I started her on Solu-Medrol on 03/27/2023.  Patient's symptoms started to improve, hypoxia improved, she was weaned to RA. Cardiology discontinued Lasix.   AKI on CKD stage IIIb: Baseline creatinine appears to be around 1.3-1.5, peaked at 1.91 but improved to 1.6 on 03/24/2023 and has been stable.   Hypokalemia: Resolved   Acute anemia: Almost 3 months ago, patient's hemoglobin was normal.  She presents with 10.6 which gradually dropped to 8.6 but improved to 9.3 without any transfusion.  No reports  of hematemesis, melena or hematochezia.  Cardiology concerned about GI bleed and ordered FOBT which was negative.    History of SVT but here with paroxysmal atrial fibrillation with RVR. Patient has history of SVT and is on Cardizem PTA. Cardizem was on hold on admission to allow room for diuresis. Found to have A-fib with RVR early in the morning of 3/10.  Patient has been on and off amiodarone  drip, cardiology has discontinued IV and started on oral amiodarone and she was started on Coreg and digoxin on 03/28/2023.  Rates better controlled.  She is being discharged on amiodarone.  Cardiology started her on Coreg but that was discontinued by palliative care.   Acute urinary retention. Was unable to urinate on her own with bladder scan showing 400+ cc.  Placed Foley catheter which was eventually discontinued on 03/25/2023 and patient has had many successful voiding trials.   Possible cellulitis. Patient was presumed to have cellulitis of bilateral legs based on appearance.  She was started on cefazolin.  On my examination, I did not see any erythema, no warmth or tenderness.  Looks like antibiotics were started on 03/20/2023, will continue for total of 5 to 7 days.   History of CAD and mild aortic stenosis/elevated troponin. Last 2023.  Nonobstructive CAD with patent stent.Troponins minimally elevated but EKG does not show any evidence of acute ischemia.  Echo shows preserved EF.  No significant wall abnormality.  Likely demand ischemia.   Hypertension: Blood pressure on the low side.  All antihypertensives on hold.   Acute metabolic encephalopathy. Likely in the setting of hypotension. Per previous hospitalist documentation  "blood pressure corrected with midodrine with improvement in mentation".  However I did not see any midodrine ordered.  Patient is alert and oriented, needs some prompts though.   Hypothyroidism. Continued on Synthroid. Both TSH and free T4 elevated. I reduced Synthroid from 88 mcg to 75 mcg starting 03/24/2023.  However palliative care recommended discontinuing this medication at discharge.   History of seizures. Continuing Keppra.     Depression. On Cymbalta,   HLD. Continuing statin.   Goals of care conversation. Previous hospitalist discussed with the patient as well as in presence of niece.  Patient wanted to be DNR however per documentation/CODE STATUS  order, she would like to be intubated if needed.  Patient herself did not appear to have the understanding of CODE STATUS at the moment so on 03/27/2023 I had a lengthy discussion with the patient's [facility] POA/niece [name] and she clarified that there may have been some misunderstanding but patient is supposed to be full DNR with no intubation and no CPR.  I changed the CODE STATUS after talking to her.  We then talked in length about patient's poor prognosis and that I am concerned that patient may not do well during this hospitalization.  The niece was very much understanding of this as patient's husband went through hospitalization and eventually transition to comfort care a year ago or so.  She was very much accepting of that.    Palliative care consulted.  Awaiting evaluation.  However today patient said " I am ready to go" and I did clarify with her and she clearly said that she is ready to go meet the guard.  We also discussed if she would like for [place] to discontinue life-saving medications and focus on comfort care only and to that, she agreed.  I then spoke to the niece [name] again 03/29/2023.  She verbalized that  the patient has been saying this for last 2 days and she brought other family members to meet her as well and patient basically said goodbye to them.  Samara Deist believes that patient has made her mind and she is ready for [place] to transition her to comfort care and she is also on the same page so with her agreement, patient was transitioned to comfort care on the afternoon of 03/29/2023.  She was seen by palliative care as well. She is being discharged to [facility].  Discharge plan was discussed with patient and/or family member and they verbalized understanding and agreed with it.  Discharge Diagnoses:  Principal Problem:   Acute on chronic diastolic (congestive) heart failure (HCC) Active Problems:   Hypertension   Hyperlipidemia   Hypothyroidism   History of  seizures    Depression   CAD in native artery   GERD   IBS   ILD (interstitial lung disease) (HCC)   Mild aortic stenosis   Chronic pain syndrome   Chronic kidney disease, stage 3a (HCC)    Discharge Instructions   Allergies as of 03/31/2023   No Known Allergies      Medication List     STOP taking these medications    ALPRAZolam 0.25 MG tablet Commonly known as: XANAX   ARIPiprazole 5 MG tablet Commonly known as: ABILIFY   aspirin EC 81 MG tablet   calcitRIOL 0.25 MCG capsule Commonly known as: ROCALTROL   [employer] D3 50 MCG (2000 UT) Caps Generic drug: Cholecalciferol   diltiazem 180 MG 24 hr capsule Commonly known as: CARDIZEM CD   DULoxetine 30 MG capsule Commonly known as: CYMBALTA   DULoxetine 60 MG capsule Commonly known as: CYMBALTA   ferrous sulfate 325 (65 FE) MG tablet   HYDROcodone-acetaminophen 10-325 MG tablet Commonly known as: NORCO   nebivolol 5 MG tablet Commonly known as: BYSTOLIC   rosuvastatin 40 MG tablet Commonly known as: CRESTOR       TAKE these medications    acetaminophen 500 MG tablet Commonly known as: TYLENOL Take 500 mg by mouth every 6 (six) hours as needed for mild pain (pain score 1-3) or moderate pain (pain score 4-6).   amiodarone 200 MG tablet Commonly known as: PACERONE Take 1 tablet (200 mg total) by mouth 2 (two) times daily.   apixaban 2.5 MG Tabs tablet Commonly known as: ELIQUIS Take 1 tablet (2.5 mg total) by mouth 2 (two) times daily.   levETIRAcetam 500 MG tablet Commonly known as: KEPPRA TAKE 1 TABLET BY MOUTH TWICE A DAY   levothyroxine 88 MCG tablet Commonly known as: SYNTHROID Take 88 mcg by mouth every morning.   nitroGLYCERIN 0.4 MG SL tablet Commonly known as: NITROSTAT PLACE 1 TABLET (0.4 MG TOTAL) UNDER THE TONGUE EVERY 5 (FIVE) MINUTES AS NEEDED FOR CHEST PAIN.   ondansetron 4 MG tablet Commonly known as: ZOFRAN Take 1 tablet (4 mg total) by mouth every 6 (six) hours as needed for nausea.    pantoprazole 40 MG tablet Commonly known as: PROTONIX Take 1 tablet (40 mg total) by mouth 2 (two) times daily.   potassium chloride SA 20 MEQ tablet Commonly known as: KLOR-CON M Take 20 mEq by mouth 2 (two) times daily.        Contact information for follow-up providers     Merri Brunette, MD Follow up in 1 week(s).   Specialty: Family Medicine Contact information: [address], [address] [place] [address] [phone]  Contact information for after-discharge care     Destination     [facility] .   Service: Skilled Nursing Contact information: [address] [place] [address] [phone]                    No Known Allergies  Consultations: Cardiology and palliative care   Procedures/Studies: CT Chest High Resolution Result Date: 03/28/2023 CLINICAL DATA:  78 year old female with history of hypoxia. Shortness of breath. Suspected interstitial lung disease. EXAM: CT CHEST WITHOUT CONTRAST TECHNIQUE: Multidetector CT imaging of the chest was performed following the standard protocol without intravenous contrast. High resolution imaging of the lungs, as well as inspiratory and expiratory imaging, was performed. RADIATION DOSE REDUCTION: This exam was performed according to the departmental dose-optimization program which includes automated exposure control, adjustment of the mA and/or kV according to patient size and/or use of iterative reconstruction technique. COMPARISON:  High-resolution chest CT 02/19/2019. FINDINGS: Cardiovascular: Heart size is mildly enlarged. There is no significant pericardial fluid, thickening or pericardial calcification. There is aortic atherosclerosis, as well as atherosclerosis of the great vessels of the mediastinum and the coronary arteries, including calcified atherosclerotic plaque in the left main, left anterior descending, left circumflex and right coronary arteries. Severe  calcifications of the mitral annulus. Moderate calcifications of the aortic valve. Right upper extremity PICC with tip terminating in the distal superior vena cava. Mediastinum/Nodes: No pathologically enlarged mediastinal or hilar lymph nodes. Please note that accurate exclusion of hilar adenopathy is limited on noncontrast CT scans. Esophagus is unremarkable in appearance. No axillary lymphadenopathy. Lungs/Pleura: High-resolution images are limited by extensive patient respiratory motion. With these limitations in mind, there are widespread areas of ground-glass attenuation, septal thickening, thickening of the peribronchovascular interstitium and architectural distortion scattered throughout the lungs bilaterally. These findings have no definitive gradient, although there does appear to be a slightly greater extent of upper lung involvement. No frank honeycombing. Small to moderate bilateral pleural effusions. Upper Abdomen: Aortic atherosclerosis. Trace volume of ascites. Amorphous high attenuation lying dependently in the gallbladder, likely biliary sludge. Musculoskeletal: Median sternotomy wires. There are no aggressive appearing lytic or blastic lesions noted in the visualized portions of the skeleton. IMPRESSION: 1. The appearance of the lungs is grossly abnormal, but the findings are nonspecific. In the appropriate clinical setting, this may simply represent severe multilobar bilateral bronchopneumonia. The possibility of some degree of pulmonary edema should also be considered. The possibility of interstitial lung disease is not excluded, but can not be accurately discerned on today's limited study. Repeat high-resolution chest CT should be considered in 3 months after resolution of the patient's acute illness to better characterize the true extent of non reversible disease. 2. Cardiomegaly. 3. Aortic atherosclerosis, in addition to left main and three-vessel coronary artery disease. Assessment for  potential risk factor modification, dietary therapy or pharmacologic therapy may be warranted, if clinically indicated. 4. There are calcifications of the aortic valve and mitral annulus. Echocardiographic correlation for evaluation of potential valvular dysfunction may be warranted if clinically indicated. 5. Trace volume of ascites. 6. Probable biliary sludge lying dependently in the gallbladder. Aortic Atherosclerosis (ICD10-I70.0). Electronically Signed   By: Trudie Reed M.D.   On: 03/28/2023 06:48   DG Chest Port 1 View Result Date: 03/26/2023 CLINICAL DATA:  Difficulty breathing, acute respiratory distress EXAM: PORTABLE CHEST 1 VIEW COMPARISON:  03/22/2023, 03/20/2023 FINDINGS: 2 frontal views of the chest demonstrate right-sided PICC tip overlying atriocaval junction. Cardiac silhouette is stable, with dense  calcification of the mitral annulus again noted. Postsurgical changes are seen from prior partial right lower lobe resection. Bilateral upper lobe predominant airspace disease again identified, without appreciable change allowing for differences in positioning and technique. Small bilateral effusions. No pneumothorax. No acute bony abnormalities. IMPRESSION: 1. Upper lobe predominant bilateral airspace disease, not significantly changed since 03/22/2023 but slightly progressed since 03/20/2023. Findings could reflect worsening infection or edema. 2. Small bilateral pleural effusions. Electronically Signed   By: Sharlet Salina M.D.   On: 03/26/2023 14:53   DG CHEST PORT 1 VIEW Result Date: 03/22/2023 CLINICAL DATA:  Central line EXAM: PORTABLE CHEST 1 VIEW COMPARISON:  Chest x-ray 03/20/2023 FINDINGS: Right upper extremity PICC terminates in the distal SVC. There is no pneumothorax. Sternotomy wires are present. The cardiomediastinal silhouette is stable, the heart is mildly enlarged. Bilateral multifocal airspace disease has not significantly changed. No pleural effusion or pneumothorax. No  acute fractures are seen. IMPRESSION: 1. Right upper extremity PICC terminates in the distal SVC. No pneumothorax. 2. Stable bilateral multifocal airspace disease. Electronically Signed   By: Darliss Cheney M.D.   On: 03/22/2023 18:54   [place] EKG SITE RITE Result Date: 03/22/2023 If Site Rite image not attached, placement could not be confirmed due to current cardiac rhythm.  ECHOCARDIOGRAM COMPLETE Result Date: 03/21/2023    ECHOCARDIOGRAM REPORT   Patient Name:   SHOLONDA JOBST Date of Exam: 03/21/2023 Medical Rec #:  [id number]      Height:       60.0 in Accession #:    [id number]     Weight:       110.0 lb Date of Birth:  December 06, 1945      BSA:          1.448 m Patient Age:    77 years       BP:           97/62 mmHg Patient Gender: F              HR:           154 bpm. Exam Location:  Inpatient Procedure: 2D Echo, Cardiac Doppler and Color Doppler (Both Spectral and Color            Flow Doppler were utilized during procedure). Indications:    Congestive heart failure  History:        Patient has prior history of Echocardiogram examinations. CHF,                 CAD, Arrythmias:Atrial Fibrillation; Risk Factors:Hypertension.  Sonographer:    Lamont Snowball Referring Phys: Zachery Conch PATEL  Sonographer Comments: Image acquisition challenging due to uncooperative patient. IMPRESSIONS  1. Left ventricular ejection fraction, by estimation, is 60 to 65%. The left ventricle has normal function. The left ventricle has no regional wall motion abnormalities. There is moderate concentric left ventricular hypertrophy. Left ventricular diastolic parameters are indeterminate.  2. Right ventricular systolic function is moderately reduced. The right ventricular size is moderately enlarged.  3. Left atrial size was moderately dilated.  4. Right atrial size was severely dilated.  5. The mitral valve is degenerative. Mild mitral valve regurgitation. Moderate mitral annular calcification.  6. Tricuspid valve regurgitation is mild to  moderate.  7. Suspect moderate low-flow/low-gradient AS. Marland Kitchen The aortic valve is tricuspid. There is moderate calcification of the aortic valve. Aortic valve regurgitation is not visualized. Moderate aortic valve stenosis. Aortic valve area, by VTI measures 1.10 cm. Aortic valve mean gradient measures 8.0  mmHg. Aortic valve Vmax measures 1.88 m/s.  8. The inferior vena cava is dilated in size with <50% respiratory variability, suggesting right atrial pressure of 15 mmHg. Conclusion(s)/Recommendation(s): PAtient in rapid AF at time of study. FINDINGS  Left Ventricle: Left ventricular ejection fraction, by estimation, is 60 to 65%. The left ventricle has normal function. The left ventricle has no regional wall motion abnormalities. The left ventricular internal cavity size was normal in size. There is  moderate concentric left ventricular hypertrophy. Left ventricular diastolic parameters are indeterminate. Right Ventricle: The right ventricular size is moderately enlarged. No increase in right ventricular wall thickness. Right ventricular systolic function is moderately reduced. Left Atrium: Left atrial size was moderately dilated. Right Atrium: Right atrial size was severely dilated. Pericardium: There is no evidence of pericardial effusion. Mitral Valve: The mitral valve is degenerative in appearance. There is mild calcification of the mitral valve leaflet(s). Moderate mitral annular calcification. Mild mitral valve regurgitation. MV peak gradient, 6.5 mmHg. The mean mitral valve gradient is 4.0 mmHg. Tricuspid Valve: The tricuspid valve is normal in structure. Tricuspid valve regurgitation is mild to moderate. Aortic Valve: Suspect moderate low-flow/low-gradient AS. The aortic valve is tricuspid. There is moderate calcification of the aortic valve. Aortic valve regurgitation is not visualized. Moderate aortic stenosis is present. Aortic valve mean gradient measures 8.0 mmHg. Aortic valve peak gradient measures 14.2  mmHg. Aortic valve area, by VTI measures 1.10 cm. Pulmonic Valve: The pulmonic valve was normal in structure. Pulmonic valve regurgitation is trivial. Aorta: The aortic root is normal in size and structure. Venous: The inferior vena cava is dilated in size with less than 50% respiratory variability, suggesting right atrial pressure of 15 mmHg. IAS/Shunts: No atrial level shunt detected by color flow Doppler.  LEFT VENTRICLE PLAX 2D LVIDd:         2.80 cm LVIDs:         2.20 cm LV PW:         1.30 cm LV IVS:        1.20 cm LVOT diam:     1.90 cm LV SV:         31 LV SV Index:   21 LVOT Area:     2.84 cm  RIGHT VENTRICLE          IVC RV Basal diam:  4.50 cm  IVC diam: 1.90 cm LEFT ATRIUM             Index        RIGHT ATRIUM           Index LA Vol (A2C):   45.8 ml 31.63 ml/m  RA Area:     16.80 cm LA Vol (A4C):   30.4 ml 21.00 ml/m  RA Volume:   47.60 ml  32.88 ml/m LA Biplane Vol: 38.0 ml 26.25 ml/m  AORTIC VALVE AV Area (Vmax):    1.25 cm AV Area (Vmean):   1.17 cm AV Area (VTI):     1.10 cm AV Vmax:           188.50 cm/s AV Vmean:          136.500 cm/s AV VTI:            0.282 m AV Peak Grad:      14.2 mmHg AV Mean Grad:      8.0 mmHg LVOT Vmax:         83.33 cm/s LVOT Vmean:        56.233 cm/s LVOT  VTI:          0.109 m LVOT/AV VTI ratio: 0.39  AORTA Ao Root diam: 2.60 cm Ao Asc diam:  2.90 cm MITRAL VALVE MV Area (PHT): 6.13 cm     SHUNTS MV Area VTI:   2.46 cm     Systemic VTI:  0.11 m MV Peak grad:  6.5 mmHg     Systemic Diam: 1.90 cm MV Mean grad:  4.0 mmHg MV Vmax:       1.27 m/s MV Vmean:      95.4 cm/s MV Decel Time: 124 msec MV E velocity: 115.67 cm/s Arvilla Meres MD Electronically signed by Arvilla Meres MD Signature Date/Time: 03/21/2023/1:52:00 PM    Final    DG Chest Portable 1 View Result Date: 03/20/2023 CLINICAL DATA:  Shortness of breath. EXAM: PORTABLE CHEST 1 VIEW COMPARISON:  November 18, 2022. FINDINGS: Stable cardiomediastinal silhouette. Sternotomy wires are noted. Diffuse  interstitial densities are noted throughout both lungs concerning for pulmonary edema or possibly multifocal pneumonia. Small pleural effusions are noted. Bony thorax is unremarkable. IMPRESSION: Diffuse bilateral lung opacities concerning for edema or pneumonia. Electronically Signed   By: Lupita Raider M.D.   On: 03/20/2023 13:51     Discharge Exam: Vitals:   03/30/23 1938 03/31/23 0928  BP: 107/74 124/88  Pulse: 98 99  Resp: 20 19  Temp: 98 F (36.7 C) 98.2 F (36.8 C)  SpO2: 93% 91%   Vitals:   03/29/23 1950 03/29/23 2000 03/30/23 1938 03/31/23 0928  BP: 108/79  107/74 124/88  Pulse: 87 (!) 110 98 99  Resp: 16  20 19   Temp: 97.8 F (36.6 C)  98 F (36.7 C) 98.2 F (36.8 C)  TempSrc: Oral  Oral Oral  SpO2: 96% 95% 93% 91%  Weight:      Height:        General: Pt is alert, awake, not in acute distress Cardiovascular: RRR, S1/S2 +, no rubs, no gallops Respiratory: CTA bilaterally, no wheezing, no rhonchi Abdominal: Soft, NT, ND, bowel sounds + Extremities: no edema, no cyanosis    The results of significant diagnostics from this hospitalization (including imaging, microbiology, ancillary and laboratory) are listed below for reference.     Microbiology: Recent Results (from the past 240 hours)  MRSA Next Gen by PCR, Nasal     Status: None   Collection Time: 03/25/23 11:29 PM   Specimen: Nasal Mucosa; Nasal Swab  Result Value Ref Range Status   MRSA by PCR Next Gen NOT DETECTED NOT DETECTED Final    Comment: (NOTE) The GeneXpert MRSA Assay (FDA approved for NASAL specimens only), is one component of a comprehensive MRSA colonization surveillance program. It is not intended to diagnose MRSA infection nor to guide or monitor treatment for MRSA infections. Test performance is not FDA approved in patients less than 6 years old. Performed at [facility], [address], [place], [place] [address]      Labs: BNP (last 3 results) Recent Labs     03/25/23 0933 03/27/23 0530 03/29/23 0500  BNP 1,201.7* 876.3* 676.0*   Basic Metabolic Panel: Recent Labs  Lab 03/25/23 0500 03/26/23 0500 03/27/23 0530 03/28/23 0907 03/28/23 1058 03/29/23 0500  NA 137 138 140 137  --  138  K 4.4 3.4* 3.7 3.2*  --  3.7  CL 108 103 100 91*  --  92*  CO2 21* 26 28 29   --  32  GLUCOSE 162* 118* 120* 167*  --  149*  BUN 37* 32* 30* 34*  --  44*  CREATININE 1.67* 1.58* 1.60* 1.67*  --  1.65*  CALCIUM 7.0* 7.0* 7.1* 6.6*  --  6.3*  MG  --   --   --   --  1.1*  --    Liver Function Tests: No results for input(s): "AST", "ALT", "ALKPHOS", "BILITOT", "PROT", "ALBUMIN" in the last 168 hours.  No results for input(s): "LIPASE", "AMYLASE" in the last 168 hours. No results for input(s): "AMMONIA" in the last 168 hours. CBC: Recent Labs  Lab 03/25/23 0500 03/26/23 0500 03/27/23 0530 03/28/23 0430 03/29/23 0500  WBC 10.0 10.4 11.7* 9.7 9.7  NEUTROABS 7.7  --   --   --   --   HGB 9.0* 9.3* 9.9* 10.3* 10.3*  HCT 29.3* 29.9* 31.1* 31.1* 32.8*  MCV 94.2 93.1 91.5 89.9 90.9  PLT 302 337 385 398 470*   Cardiac Enzymes: No results for input(s): "CKTOTAL", "CKMB", "CKMBINDEX", "TROPONINI" in the last 168 hours. BNP: Invalid input(s): "POCBNP" CBG: Recent Labs  Lab 03/28/23 0750 03/28/23 1154 03/28/23 1623  GLUCAP 142* 93 112*   D-Dimer No results for input(s): "DDIMER" in the last 72 hours. Hgb A1c No results for input(s): "HGBA1C" in the last 72 hours. Lipid Profile No results for input(s): "CHOL", "HDL", "LDLCALC", "TRIG", "CHOLHDL", "LDLDIRECT" in the last 72 hours. Thyroid function studies No results for input(s): "TSH", "T4TOTAL", "T3FREE", "THYROIDAB" in the last 72 hours.  Invalid input(s): "FREET3" Anemia work up No results for input(s): "VITAMINB12", "FOLATE", "FERRITIN", "TIBC", "IRON", "RETICCTPCT" in the last 72 hours. Urinalysis    Component Value Date/Time   COLORURINE YELLOW 11/18/2022 0852   APPEARANCEUR CLOUDY (A)  11/18/2022 0852   LABSPEC 1.040 (H) 11/18/2022 0852   PHURINE 6.0 11/18/2022 0852   GLUCOSEU NEGATIVE 11/18/2022 0852   HGBUR LARGE (A) 11/18/2022 0852   BILIRUBINUR SMALL (A) 11/18/2022 0852   KETONESUR TRACE (A) 11/18/2022 0852   PROTEINUR 30 (A) 11/18/2022 0852   UROBILINOGEN 0.2 02/14/2014 1643   NITRITE NEGATIVE 11/18/2022 0852   LEUKOCYTESUR SMALL (A) 11/18/2022 0852   Sepsis Labs Recent Labs  Lab 03/26/23 0500 03/27/23 0530 03/28/23 0430 03/29/23 0500  WBC 10.4 11.7* 9.7 9.7   Microbiology Recent Results (from the past 240 hours)  MRSA Next Gen by PCR, Nasal     Status: None   Collection Time: 03/25/23 11:29 PM   Specimen: Nasal Mucosa; Nasal Swab  Result Value Ref Range Status   MRSA by PCR Next Gen NOT DETECTED NOT DETECTED Final    Comment: (NOTE) The GeneXpert MRSA Assay (FDA approved for NASAL specimens only), is one component of a comprehensive MRSA colonization surveillance program. It is not intended to diagnose MRSA infection nor to guide or monitor treatment for MRSA infections. Test performance is not FDA approved in patients less than 28 years old. Performed at [facility], [address], [place], [place] [address]     FURTHER DISCHARGE INSTRUCTIONS:   Get Medicines reviewed and adjusted: Please take all your medications with you for your next visit with your Primary MD   Laboratory/radiological data: Please request your Primary MD to go over all hospital tests and procedure/radiological results at the follow up, please ask your Primary MD to get all Hospital records sent to his/her office.   In some cases, they will be blood work, cultures and biopsy results pending at the time of your discharge. Please request that your primary care M.D. goes through  all the records of your hospital data and follows up on these results.   Also Note the following: If you experience worsening of your admission symptoms, develop shortness of breath, life  threatening emergency, suicidal or homicidal thoughts you must seek medical attention immediately by calling 911 or calling your MD immediately  if symptoms less severe.   You must read complete instructions/literature along with all the possible adverse reactions/side effects for all the Medicines you take and that have been prescribed to you. Take any new Medicines after you have completely understood and accpet all the possible adverse reactions/side effects.    Do not drive when taking Pain medications or sleeping medications (Benzodaizepines)   Do not take more than prescribed Pain, Sleep and Anxiety Medications. It is not advisable to combine anxiety,sleep and pain medications without talking with your primary care practitioner   Special Instructions: If you have smoked or chewed Tobacco  in the last 2 yrs please stop smoking, stop any regular Alcohol  and or any Recreational drug use.   Wear Seat belts while driving.   Please note: You were cared for by a hospitalist during your hospital stay. Once you are discharged, your primary care physician will handle any further medical issues. Please note that NO REFILLS for any discharge medications will be authorized once you are discharged, as it is imperative that you return to your primary care physician (or establish a relationship with a primary care physician if you do not have one) for your post hospital discharge needs so that they can reassess your need for medications and monitor your lab values  Time coordinating discharge: Over 30 minutes  SIGNED:   Hughie Closs, MD  [facility] 03/31/2023, 2:38 PM *Please note that this is a verbal dictation therefore any spelling or grammatical errors are due to the "Dragon [employer] One" system interpretation. If 7PM-7AM, please contact night-coverage [email or web address]

## 2023-03-30 NOTE — TOC Progression Note (Addendum)
 Transition of Care [facility]) - Progression Note    Patient Details  Name: [name] MRN: [id number] Date of Birth: 1945/03/14  [facility]) CM/SW Contact  Michaela Corner, [place] Phone Number: 03/30/2023, 10:43 AM  Clinical Narrative:   Awaiting [facility] to evaluate patient and bed availability for [facility].  2:53 PM ACC informed CSW that they will re-eval patient tomorrow for appropriateness of [facility].   TOC will continue to follow.    Expected Discharge Plan: Skilled Nursing Facility Barriers to Discharge: Continued Medical Work up  Expected Discharge Plan and Services In-house Referral: Clinical Social Work Discharge Planning Services: CM Consult   Living arrangements for the past 2 months: Single Family Home                                       Social Determinants of Health (SDOH) Interventions SDOH Screenings   Food Insecurity: No Food Insecurity (03/21/2023)  Housing: Low Risk  (03/21/2023)  Transportation Needs: No Transportation Needs (03/21/2023)  Utilities: Not At Risk (03/21/2023)  Social Connections: Socially Isolated (03/21/2023)  Tobacco Use: Medium Risk (03/22/2023)    Readmission Risk Interventions    03/22/2023    1:22 PM 11/26/2022   12:03 PM 11/19/2022   11:49 AM  Readmission Risk Prevention Plan  Transportation Screening Complete Complete Complete  PCP or Specialist Appt within 5-7 Days  Complete Complete  PCP or Specialist Appt within 3-5 Days Complete    Home Care Screening  Complete Complete  Medication Review (RN CM)  Complete Complete  HRI or Home Care Consult Complete    Medication Review (RN Care Manager) Complete

## 2023-03-30 NOTE — Progress Notes (Signed)
 03/29/2023 at 2130: Patient noted to have transitioned to comfort care. Asked patient if she would like to take her normal medications and listed the medications due and the indication. Patient stated, "well, that'll be alright". Patient endorsing some back pain and some anxiety. Offered to bring medication to help with that with her night time medication and she agreed. Patient noted to be on room air. When I asked if she wanted to stay off of her oxygen, she stated she didn't know. When I asked if she felt more comfortable with or without the oxygen, she stated without. Patient left on room air.   03/30/2023 at 0500. Discussed medications due for patient. Patient agreeable to take synthroid.

## 2023-03-31 DIAGNOSIS — I480 Paroxysmal atrial fibrillation: Secondary | ICD-10-CM | POA: Diagnosis not present

## 2023-03-31 DIAGNOSIS — I509 Heart failure, unspecified: Secondary | ICD-10-CM | POA: Diagnosis not present

## 2023-03-31 DIAGNOSIS — I5033 Acute on chronic diastolic (congestive) heart failure: Secondary | ICD-10-CM | POA: Diagnosis not present

## 2023-03-31 DIAGNOSIS — Z515 Encounter for palliative care: Secondary | ICD-10-CM | POA: Diagnosis not present

## 2023-03-31 DIAGNOSIS — Z66 Do not resuscitate: Secondary | ICD-10-CM | POA: Diagnosis not present

## 2023-03-31 MED ORDER — APIXABAN 2.5 MG PO TABS: 2.5000 mg | ORAL_TABLET | Freq: Two times a day (BID) | ORAL | 0 refills | Status: AC

## 2023-03-31 MED ORDER — AMIODARONE HCL 200 MG PO TABS: 200.0000 mg | ORAL_TABLET | Freq: Two times a day (BID) | ORAL | 0 refills | Status: AC

## 2023-03-31 NOTE — Plan of Care (Signed)
  Problem: Education: Goal: Knowledge of General Education information will improve Description: Including pain rating scale, medication(s)/side effects and non-pharmacologic comfort measures Outcome: Progressing   Problem: Clinical Measurements: Goal: Will remain free from infection Outcome: Progressing   Problem: Coping: Goal: Level of anxiety will decrease Outcome: Progressing   Problem: Elimination: Goal: Will not experience complications related to bowel motility Outcome: Progressing Goal: Will not experience complications related to urinary retention Outcome: Progressing   Problem: Pain Managment: Goal: General experience of comfort will improve and/or be controlled Outcome: Progressing   Problem: Safety: Goal: Ability to remain free from injury will improve Outcome: Progressing   Problem: Skin Integrity: Goal: Risk for impaired skin integrity will decrease Outcome: Progressing

## 2023-03-31 NOTE — Progress Notes (Signed)
 Progress Note  Patient Name: SCOTTLYN MCHANEY Date of Encounter: 03/31/2023  Primary Cardiologist: Armanda Magic, MD  Subjective   Off telemetry lethargic   Inpatient Medications    Scheduled Meds:  amiodarone  200 mg Oral BID   antiseptic oral rinse  15 mL Mouth Rinse BID   apixaban  2.5 mg Oral BID   levETIRAcetam  500 mg Oral BID   senna-docusate  1 tablet Oral BID   sodium chloride flush  10-40 mL Intracatheter Q12H   Continuous Infusions:  PRN Meds: acetaminophen **OR** acetaminophen, ALPRAZolam, feeding supplement, glycopyrrolate **OR** glycopyrrolate **OR** glycopyrrolate, HYDROcodone-acetaminophen, levalbuterol, loperamide, morphine injection, nitroGLYCERIN, ondansetron **OR** ondansetron (ZOFRAN) IV, polyethylene glycol, polyvinyl alcohol, sodium chloride flush   Vital Signs    Vitals:   03/29/23 1950 03/29/23 2000 03/30/23 1938 03/31/23 0928  BP: 108/79  107/74 124/88  Pulse: 87 (!) 110 98 99  Resp: 16  20 19   Temp: 97.8 F (36.6 C)  98 F (36.7 C) 98.2 F (36.8 C)  TempSrc: Oral  Oral Oral  SpO2: 96% 95% 93% 91%  Weight:      Height:        Intake/Output Summary (Last 24 hours) at 03/31/2023 0951 Last data filed at 03/31/2023 7106 Gross per 24 hour  Intake 630 ml  Output 600 ml  Net 30 ml      03/28/2023    3:02 AM 03/27/2023    5:00 AM 03/26/2023    3:18 AM  Last 3 Weights  Weight (lbs) 98 lb 12.3 oz 105 lb 2.6 oz 109 lb 9.1 oz  Weight (kg) 44.8 kg 47.7 kg 49.7 kg     Telemetry    No longer on tele - Personally Reviewed  Physical Exam   Lethargic Chronically ill Basilar crackles Mandibular tremor Prior sternotomy Plus one edema  Labs    High Sensitivity Troponin:   Recent Labs  Lab 03/20/23 1336 03/20/23 1545 03/28/23 1058 03/28/23 1406  TROPONINIHS 72* 88* 16 18*      Cardiac EnzymesNo results for input(s): "TROPONINI" in the last 168 hours. No results for input(s): "TROPIPOC" in the last 168 hours.   Chemistry Recent Labs   Lab 03/27/23 0530 03/28/23 0907 03/29/23 0500  NA 140 137 138  K 3.7 3.2* 3.7  CL 100 91* 92*  CO2 28 29 32  GLUCOSE 120* 167* 149*  BUN 30* 34* 44*  CREATININE 1.60* 1.67* 1.65*  CALCIUM 7.1* 6.6* 6.3*  GFRNONAA 33* 32* 32*  ANIONGAP 12 17* 14     Hematology Recent Labs  Lab 03/27/23 0530 03/28/23 0430 03/29/23 0500  WBC 11.7* 9.7 9.7  RBC 3.40* 3.46* 3.61*  HGB 9.9* 10.3* 10.3*  HCT 31.1* 31.1* 32.8*  MCV 91.5 89.9 90.9  MCH 29.1 29.8 28.5  MCHC 31.8 33.1 31.4  RDW 13.9 13.7 13.6  PLT 385 398 470*    BNP Recent Labs  Lab 03/25/23 0933 03/27/23 0530 03/29/23 0500  BNP 1,201.7* 876.3* 676.0*     DDimer No results for input(s): "DDIMER" in the last 168 hours.   Radiology    No results found.  Cardiac Studies   2d echo 03/21/23   1. Left ventricular ejection fraction, by estimation, is 60 to 65%. The  left ventricle has normal function. The left ventricle has no regional  wall motion abnormalities. There is moderate concentric left ventricular  hypertrophy. Left ventricular  diastolic parameters are indeterminate.   2. Right ventricular systolic function is moderately reduced. The right  ventricular size is moderately enlarged.   3. Left atrial size was moderately dilated.   4. Right atrial size was severely dilated.   5. The mitral valve is degenerative. Mild mitral valve regurgitation.  Moderate mitral annular calcification.   6. Tricuspid valve regurgitation is mild to moderate.   7. Suspect moderate low-flow/low-gradient AS. Marland Kitchen The aortic valve is  tricuspid. There is moderate calcification of the aortic valve. Aortic  valve regurgitation is not visualized. Moderate aortic valve stenosis.  Aortic valve area, by VTI measures 1.10  cm. Aortic valve mean gradient measures 8.0 mmHg. Aortic valve Vmax  measures 1.88 m/s.   8. The inferior vena cava is dilated in size with <50% respiratory  variability, suggesting right atrial pressure of 15 mmHg.     Patient Profile     78 y.o. female with chronic HFpEF, CAD s/p multiple interventions (DES to RCA in 2011 for MI, DES to RCA on 06/2010, DES to LAD in 2014), hypertension, hyperlipidemia, aortic stenosis, esophageal strictures s/p dilation, GERD, hypothyroidism, CKD stage 3b, 2017 VATS with chronic bronchiolitis without ILD, DVT in 2019, pericardial cyst s/p resection. She was admitted for worsening SOB And LE edema. CXR concerning for pulmonary edema versus PNA. hsTroponin mildly elevated. Noted to be in AF RVR, management limited by hypotension. Of note, chart mentions possible prior h/o ILD though she saw pulmonology (last in 2021) who obtained CT scanning and did not feel this was the case. Chronic respiratory failure also listed but pt not on home O2 prior to admission. Echo showed EF 60-65%, mild MR, mild-moderate TR, moderate low-flow/low-gradient AS, dilated IVC.   Assessment & Plan    1. Acute on chronic HFpEF with new RV dysfunction, acute respiratory failure with hypoxia, possible PNA vs ILD - diuresed  - worse chest CT ? Bilateral pneumonia - will write for once daily oral lasix   2. New onset persistent atrial fibrillation with RVR - TSH elevated at 6.3, free T4 elevated 1.48 -- known history of hypothyroidism, currently on synthroid  - oral amiodarone - low dose eliquis   3. Valvular heart disease - mild MR, mild-moderate TR, moderate LF/LG AS - being managed medically at this time  4. CAD s/p DES to RCA 2011, DES to RCA 06/2010, DES to LAD 2014, Hyperlipidemia  - mildly elevated troponin felt possibly due to demand ischemia - rosuvastatin changed to atorvastatin given CrCl - ASA discontinued due to bleeding risk and concomitant Eliquis    5. AKI on CKD 3b - Cr 1.65 stable  - should be able to tolerate once daily lasix   Per primary Hypothyroidism with abnormal TFTs Possible cellulitis GERD Normocytic anemia - FOBT negative Seizure disorder  Acute metabolic  encephalopathy Urinary retention Electrolyte disturbances - hypomagnesemia, hypocalcemia  Cardiology will sign off  Charlton Haws MD [facility]

## 2023-03-31 NOTE — Progress Notes (Addendum)
 [facility] Liaison Note   Received request for interest in [facility]. Attempted to contact [name], niece, to discuss care and expectations as well as any potential out of pocket expense. However I was unable to contact her and left a voicemail as well as sending a text.   Hospital liaison will follow up with niece tomorrow if call is not returned sooner.  Please do not hesitate to call with questions.   400pm update. Spoke with niece. Patient is approved for [facility]. However, [facility] cannot offer a bed today. Hospital liaisons will continue to monitor availability.   Glenna Fellows, BSN, RN, [facility] Liaison [phone]

## 2023-03-31 NOTE — Care Management Important Message (Signed)
 Important Message  Patient Details  Name: [name] MRN: [id number] Date of Birth: [birth year]   Important Message Given:  Yes - Medicare IM     Renie Ora 03/31/2023, 1:11 PM

## 2023-03-31 NOTE — Progress Notes (Signed)
 CVAD removed per protocol per MD order. Manual pressure applied for 3 mins. Vaseline gauze, gauze, and Tegaderm applied over insertion site. No bleeding or swelling noted. Instructed patient to remain in bed for thirty mins. Educated patient about S/S of infection and when to call MD; no heavy lifting or pressure on R side for 24 hours; keep dressing dry and intact for 24 hours. Pt verbalized comprehension.

## 2023-03-31 NOTE — Plan of Care (Signed)
  Problem: Clinical Measurements: Goal: Will remain free from infection Outcome: Progressing Goal: Respiratory complications will improve Outcome: Progressing Goal: Cardiovascular complication will be avoided Outcome: Progressing   Problem: Nutrition: Goal: Adequate nutrition will be maintained Outcome: Progressing   Problem: Coping: Goal: Level of anxiety will decrease Outcome: Progressing   Problem: Pain Managment: Goal: General experience of comfort will improve and/or be controlled Outcome: Progressing   Problem: Safety: Goal: Ability to remain free from injury will improve Outcome: Progressing

## 2023-03-31 NOTE — Progress Notes (Signed)
 PROGRESS NOTE    [name]  MWU:[id number] DOB: 1945-09-03 DOA: 03/20/2023 PCP: Merri Brunette, MD   Brief Narrative:  Patient with PMH of CAD with PCI to RCA, recent cath 9/23 with nonobstructive CAD and patent stents, mild aortic stenosis, echo EF 6065% in 2023, HTN, anxiety, chronic pain syndrome, GERD, hypothyroidism, HLD, ILD, CKD 3A presented to the hospital with complaints of leg swelling and shortness of breath. Went to see PCP in 2/27 there was some trace edema noted but no significant shortness of breath.   niece found the patient with very short of breath and therefore patient was brought to the hospital.  Treated for CHF and atrial fibrillation and interstitial lung disease.  Eventually transitioned to comfort care on 03/29/2023.  Currently pending placement to [facility].  Assessment & Plan:   Principal Problem:   Acute on chronic diastolic (congestive) heart failure (HCC) Active Problems:   Hypertension   Hyperlipidemia   Hypothyroidism   History of  seizures   Depression   CAD in native artery   GERD   IBS   ILD (interstitial lung disease) (HCC)   Mild aortic stenosis   Chronic pain syndrome   Chronic kidney disease, stage 3a (HCC)  Acute hypoxic respiratory failure secondary to acute on chronic diastolic CHF: Upon admission, BNP significantly elevated 1056.  Significant swelling in the leg.  Tripoding requiring BiPAP therapy and NRB in the ER. Chest x-ray shows significant vascular congestion.  There is comment about pneumonia although pneumonia is less likely. Echocardiogram 60 to 65% in September 2023. Last cath 9/23 40% stenosis and RPDA and second diagonal, otherwise nonobstructive CAD with patent stent in RCA. Continue BiPAP as needed.  Cardiology following and managing.  Diuretics were held for few days due to rising creatinine but she was started on Lasix 40 mg IV daily on 03/26/2023.  However in the afternoon that day Around 1:45 PM on 03/26/2023, rapid  response was called due to shortness of breath and increasing oxygen demand.  Patient was noted to have crackles.  Chest x-ray ordered, patient was started on Amio drip.  Patient had already received Lasix in the morning.  Another dose of Lasix was given.  Followed by this, the nurse called CODE BLUE on this patient around 2:30 PM while the patient was breathing and there was no loss of pulse.  [facility] team arrived at the bedside and found patient sitting in bed, has pulse, able to respond. She on BIPAP @ bedside due to desaturation.  BiPAP was removed and she was placed on 10 L salter.  Due to continued high amount of oxygen requirement and rails, interstitial lung disease/pneumonitis was considered to be one of the cause for her current condition so I started her on Solu-Medrol on 03/27/2023.  Patient improved, and gradually she has been weaned to room air now.  AKI on CKD stage IIIb: Baseline creatinine appears to be around 1.3-1.5, peaked at 1.91 but improved to 1.6 on 03/24/2023 and has been stable.  Hypokalemia: Low again, Will replenish.  Acute anemia: Almost 3 months ago, patient's hemoglobin was normal.  She presents with 10.6 which gradually dropped to 8.6 but improved to 9.3 without any transfusion.  No reports of hematemesis, melena or hematochezia.  Cardiology concerned about GI bleed and ordered FOBT which was negative.    History of SVT but here with paroxysmal atrial fibrillation with RVR. Patient has history of SVT and is on Cardizem PTA. Cardizem was on hold on  admission to allow room for diuresis. Found to have A-fib with RVR early in the morning of 3/10.  Patient has been on and off amiodarone drip, cardiology has discontinued IV and started on oral amiodarone and she was started on Coreg and digoxin on 03/28/2023.  Rates better controlled.  To focus on the comfort care, palliative care discontinued her Coreg but continued amiodarone and Eliquis.   Acute urinary retention. Was unable  to urinate on her own with bladder scan showing 400+ cc.  Placed Foley catheter which was eventually discontinued on 03/25/2023 and patient has had many successful voiding trials.   Possible cellulitis. Patient was presumed to have cellulitis of bilateral legs based on appearance.  She was started on cefazolin.  On my examination, I did not see any erythema, no warmth or tenderness.  Looks like antibiotics were started on 03/20/2023, will continue for total of 5 to 7 days.   History of CAD and mild aortic stenosis/elevated troponin. Last 2023.  Nonobstructive CAD with patent stent.Troponins minimally elevated but EKG does not show any evidence of acute ischemia.  Echo shows preserved EF.  No significant wall abnormality.  Likely demand ischemia.  Hypertension: Blood pressure on the low side.  All antihypertensives on hold.   Acute metabolic encephalopathy. Likely in the setting of hypotension. Per previous hospitalist documentation  "blood pressure corrected with midodrine with improvement in mentation".  However I did not see any midodrine ordered.  Patient is alert and oriented, needs some prompts though.   Poor venous access. RN requested a PICC line.  Placed.    GERD. Continue PPI.   Hypothyroidism. Continuing Synthroid.  Both TSH and free T4 elevated.  I reduced Synthroid from 88 mcg to 75 mcg starting 03/24/2023.  Repeat TSH in 6 to 8 weeks.   History of seizures. Continuing Keppra.  Switching to oral.   Depression. On Cymbalta, will resume tomorrow.   HLD. Continuing statin.   Goals of care conversation. Previous hospitalist discussed with the patient as well as in presence of niece.  Patient wanted to be DNR however per documentation/CODE STATUS order, she would like to be intubated if needed.  Patient herself did not appear to have the understanding of CODE STATUS at the moment so on 03/27/2023 I had a lengthy discussion with the patient's [facility] POA/niece [name] and she  clarified that there may have been some misunderstanding but patient is supposed to be full DNR with no intubation and no CPR.  I changed the CODE STATUS after talking to her.  We then talked in length about patient's poor prognosis and that I am concerned that patient may not do well during this hospitalization.  The niece was very much understanding of this as patient's husband went through hospitalization and eventually transition to comfort care a year ago or so.  She was very much accepting of that.    Palliative care consulted.  Awaiting evaluation.  However today patient said " I am ready to go" and I did clarify with her and she clearly said that she is ready to go meet the guard.  We also discussed if she would like for [place] to discontinue life-saving medications and focus on comfort care only and to that, she agreed.  I then spoke to the niece [name] again 03/29/2023.  She verbalized that the patient has been saying this for last 2 days and she brought other family members to meet her as well and patient basically said goodbye to them.  Samara Deist believes that patient has made her mind and she is ready for [place] to transition her to comfort care and she is also on the same page so with her agreement, patient was transitioned to comfort care on 03/29/2023.  Seen by palliative care.  Awaiting disposition.  TOC on board.   Prolonged QTc. Minimize QT prolonging medications.  At risk of further worsening in the setting of amiodarone use.  Monitor and EKG and telemetry.   DVT prophylaxis: apixaban (ELIQUIS) tablet 2.5 mg Start: 03/25/23 1045Heparin   Code Status: Do not attempt resuscitation (DNR) - Comfort care  Family Communication:  None present at bedside.  Discussed with niece over the phone as detailed above.  Status is: Inpatient Remains inpatient appropriate because: Now comfort care.  Will benefit from transitioning to hospice facility.  Estimated body mass index is 19.29 kg/m as calculated from  the following:   Height as of this encounter: 5' (1.524 m).   Weight as of this encounter: 44.8 kg.    Nutritional Assessment: Body mass index is 19.29 kg/m.Marland Kitchen Seen by dietician.  I agree with the assessment and plan as outlined below: Nutrition Status:        . Skin Assessment: I have examined the patient's skin and I agree with the wound assessment as performed by the wound care RN as outlined below:    Consultants:  Cardiology  Procedures:  None  Antimicrobials:  Anti-infectives (From admission, onward)    Start     Dose/Rate Route Frequency Ordered Stop   03/20/23 2200  ceFAZolin (ANCEF) IVPB 1 g/50 mL premix        1 g 100 mL/hr over 30 Minutes Intravenous Every 12 hours 03/20/23 1754 03/23/23 2210         Subjective: Seen and examined.  Very comfortable.  Not requiring any more oxygen.  She said she wants to go home.  However she understands that she is not strong enough to do ADLs by herself.  Objective: a Vitals:   03/29/23 1950 03/29/23 2000 03/30/23 1938 03/31/23 0928  BP: 108/79  107/74 124/88  Pulse: 87 (!) 110 98 99  Resp: 16  20 19   Temp: 97.8 F (36.6 C)  98 F (36.7 C) 98.2 F (36.8 C)  TempSrc: Oral  Oral Oral  SpO2: 96% 95% 93% 91%  Weight:      Height:        Intake/Output Summary (Last 24 hours) at 03/31/2023 1131 Last data filed at 03/31/2023 0927 Gross per 24 hour  Intake 630 ml  Output 600 ml  Net 30 ml   Filed Weights   03/26/23 0318 03/27/23 0500 03/28/23 0302  Weight: 49.7 kg 47.7 kg 44.8 kg    Examination:  General exam: Appears calm and comfortable, upper body tremors Respiratory system: Clear to auscultation. Respiratory effort normal. Cardiovascular system: S1 & S2 heard, RRR. No JVD, murmurs, rubs, gallops or clicks. No pedal edema. Gastrointestinal system: Abdomen is nondistended, soft and nontender. No organomegaly or masses felt. Normal bowel sounds heard. Central nervous system: Alert and oriented. No focal  neurological deficits. Extremities: Symmetric 5 x 5 power. Skin: No rashes, lesions or ulcers.  Psychiatry: Judgement and insight appear normal. Mood & affect appropriate.    Data Reviewed: I have personally reviewed following labs and imaging studies  CBC: Recent Labs  Lab 03/25/23 0500 03/26/23 0500 03/27/23 0530 03/28/23 0430 03/29/23 0500  WBC 10.0 10.4 11.7* 9.7 9.7  NEUTROABS 7.7  --   --   --   --  HGB 9.0* 9.3* 9.9* 10.3* 10.3*  HCT 29.3* 29.9* 31.1* 31.1* 32.8*  MCV 94.2 93.1 91.5 89.9 90.9  PLT 302 337 385 398 470*   Basic Metabolic Panel: Recent Labs  Lab 03/25/23 0500 03/26/23 0500 03/27/23 0530 03/28/23 0907 03/28/23 1058 03/29/23 0500  NA 137 138 140 137  --  138  K 4.4 3.4* 3.7 3.2*  --  3.7  CL 108 103 100 91*  --  92*  CO2 21* 26 28 29   --  32  GLUCOSE 162* 118* 120* 167*  --  149*  BUN 37* 32* 30* 34*  --  44*  CREATININE 1.67* 1.58* 1.60* 1.67*  --  1.65*  CALCIUM 7.0* 7.0* 7.1* 6.6*  --  6.3*  MG  --   --   --   --  1.1*  --    GFR: Estimated Creatinine Clearance: 20.5 mL/min (A) (by C-G formula based on SCr of 1.65 mg/dL (H)). Liver Function Tests: No results for input(s): "AST", "ALT", "ALKPHOS", "BILITOT", "PROT", "ALBUMIN" in the last 168 hours.  No results for input(s): "LIPASE", "AMYLASE" in the last 168 hours. No results for input(s): "AMMONIA" in the last 168 hours. Coagulation Profile: No results for input(s): "INR", "PROTIME" in the last 168 hours. Cardiac Enzymes: No results for input(s): "CKTOTAL", "CKMB", "CKMBINDEX", "TROPONINI" in the last 168 hours. BNP (last 3 results) No results for input(s): "PROBNP" in the last 8760 hours. HbA1C: No results for input(s): "HGBA1C" in the last 72 hours. CBG: Recent Labs  Lab 03/28/23 0750 03/28/23 1154 03/28/23 1623  GLUCAP 142* 93 112*   Lipid Profile: No results for input(s): "CHOL", "HDL", "LDLCALC", "TRIG", "CHOLHDL", "LDLDIRECT" in the last 72 hours. Thyroid Function  Tests: No results for input(s): "TSH", "T4TOTAL", "FREET4", "T3FREE", "THYROIDAB" in the last 72 hours.  Anemia Panel: No results for input(s): "VITAMINB12", "FOLATE", "FERRITIN", "TIBC", "IRON", "RETICCTPCT" in the last 72 hours.  Sepsis Labs: Recent Labs  Lab 03/25/23 0933 03/27/23 0530  PROCALCITON 0.33 0.27    Recent Results (from the past 240 hours)  MRSA Next Gen by PCR, Nasal     Status: None   Collection Time: 03/25/23 11:29 PM   Specimen: Nasal Mucosa; Nasal Swab  Result Value Ref Range Status   MRSA by PCR Next Gen NOT DETECTED NOT DETECTED Final    Comment: (NOTE) The GeneXpert MRSA Assay (FDA approved for NASAL specimens only), is one component of a comprehensive MRSA colonization surveillance program. It is not intended to diagnose MRSA infection nor to guide or monitor treatment for MRSA infections. Test performance is not FDA approved in patients less than 76 years old. Performed at [facility], [address] [place], [place] [address]      Radiology Studies: No results found.    Scheduled Meds:  amiodarone  200 mg Oral BID   antiseptic oral rinse  15 mL Mouth Rinse BID   apixaban  2.5 mg Oral BID   levETIRAcetam  500 mg Oral BID   senna-docusate  1 tablet Oral BID   sodium chloride flush  10-40 mL Intracatheter Q12H   Continuous Infusions:     LOS: 11 days   Hughie Closs, MD [facility]  Total time spent: 50 minutes 03/31/2023, 11:31 AM   *Please note that this is a verbal dictation therefore any spelling or grammatical errors are due to the "Dragon Medical One" system interpretation.  Please page via Amion and do not message via secure chat for urgent  patient care matters. Secure chat can be used for non urgent patient care matters.  How to contact the [facility] Attending or Consulting provider 7A - 7P or covering provider during after hours 7P -7A, for this patient?  Check the care team in [facility] and look for a) attending/consulting  TRH provider listed and b) the [facility] team listed. Page or secure chat 7A-7P. Log into [email or web address] and use Pablo Pena's universal password to access. If you do not have the password, please contact the hospital operator. Locate the [facility] provider you are looking for under [facility] and page to a number that you can be directly reached. If you still have difficulty reaching the provider, please page the [facility] (Director on Call) for the Hospitalists listed on amion for assistance.

## 2023-03-31 NOTE — Progress Notes (Addendum)
 Daily Progress Note   Patient Name: [name]       Date: 03/31/2023 DOB: [birth year]  Age: 78 y.o. MRN#: [id number] Attending Physician: Hughie Closs, MD Primary Care Physician: Merri Brunette, MD Admit Date: 03/20/2023  Reason for Consultation/Follow-up: Non pain symptom management, Pain control, Psychosocial/spiritual support, and Terminal Care  Subjective: I have reviewed medical records including EPIC notes, MAR, any available advanced directives as necessary, and labs. Received report from primary RN - no acute concerns.   Went to visit patient at bedside - no family/visitors present.  ENT at bedside providing hygiene care.  Per NT patient continues with minimal oral intake - bites/sips.  Patient is lying in bed awake.  She does not verbally respond to questions.  No signs or non-verbal gestures of pain or discomfort noted. No respiratory distress, increased work of breathing, or secretions noted.   [facility] liaison following for [facility] reassessment as appropriate.  Length of Stay: 11  Current Medications: Scheduled Meds:   amiodarone  200 mg Oral BID   antiseptic oral rinse  15 mL Mouth Rinse BID   apixaban  2.5 mg Oral BID   levETIRAcetam  500 mg Oral BID   senna-docusate  1 tablet Oral BID   sodium chloride flush  10-40 mL Intracatheter Q12H    Continuous Infusions:   PRN Meds: acetaminophen **OR** acetaminophen, ALPRAZolam, feeding supplement, glycopyrrolate **OR** glycopyrrolate **OR** glycopyrrolate, HYDROcodone-acetaminophen, levalbuterol, loperamide, morphine injection, nitroGLYCERIN, ondansetron **OR** ondansetron (ZOFRAN) IV, polyethylene glycol, polyvinyl alcohol, sodium chloride flush  Physical Exam Vitals and nursing note reviewed.  Constitutional:       General: She is not in acute distress.    Appearance: She is ill-appearing.  Pulmonary:     Effort: No respiratory distress.  Skin:    General: Skin is warm and dry.  Neurological:     Mental Status: She is alert.     Motor: Weakness present.  Psychiatric:        Cognition and Memory: Cognition is impaired. Memory is impaired.             Vital Signs: BP 107/74 (BP Location: Left Arm)   Pulse 98   Temp 98 F (36.7 C) (Oral)   Resp 20   Ht 5' (1.524 m)   Wt 44.8 kg   SpO2 93%  BMI 19.29 kg/m  SpO2: SpO2: 93 % O2 Device: O2 Device: Nasal Cannula O2 Flow Rate: O2 Flow Rate (L/min): 2 L/min  Intake/output summary:  Intake/Output Summary (Last 24 hours) at 03/31/2023 0855 Last data filed at 03/31/2023 0631 Gross per 24 hour  Intake 270 ml  Output 600 ml  Net -330 ml   LBM: Last BM Date : 03/28/23 Baseline Weight: Weight: 49.9 kg Most recent weight: Weight: 44.8 kg       Palliative Assessment/Data: PPS 20%      Patient Active Problem List   Diagnosis Date Noted   Acute on chronic diastolic (congestive) heart failure (HCC) 03/20/2023   Chronic kidney disease, stage 3a (HCC) 03/20/2023   UTI (urinary tract infection) 11/18/2022   NSTEMI (non-ST elevated myocardial infarction) (HCC) 09/16/2021   Elevated troponin 09/15/2021   Stage 3b chronic kidney disease (CKD) (HCC) 09/15/2021   Partial seizure disorder (HCC)    DVT (deep venous thrombosis) (HCC) 08/19/2017   Orthostatic hypotension 08/18/2017   Diarrhea 08/18/2017   Mild aortic stenosis    Long-term current use of opiate analgesic 04/06/2017   Chronic low back pain 04/06/2017   Chronic neck pain 04/06/2017   Chronic pain syndrome 04/06/2017   Hyperphosphatemia 11/08/2016   Trigger finger, acquired 09/30/2015   Carpal tunnel syndrome, bilateral 07/31/2015   Abnormal CAT scan 05/04/2015   Breathlessness on exertion 05/04/2015   Abnormal chest CT 05/04/2015   Anemia in chronic kidney disease (CODE)  04/29/2015   Bone disease, metabolic 04/29/2015   Microscopic hematuria 04/29/2015   Avitaminosis D 04/29/2015   Normocytic anemia 03/01/2015   Syncope due to orthostatic hypotension 03/01/2015   Dehydration 03/01/2015   Hypomagnesemia 03/01/2015   Generalized weakness 02/23/2015   Hypokalemia 02/23/2015   AKI (acute kidney injury) (HCC) 02/23/2015   Acute bronchitis 02/23/2015   Hypocalcemia 02/23/2015   ILD (interstitial lung disease) (HCC) 01/24/2015   Chronic respiratory failure (HCC) 12/26/2014   Pulmonary fibrosis (HCC) 12/26/2014   Dyspnea and respiratory abnormality 11/22/2014   Abdominal pain 10/15/2014   Pain in the chest    RUQ abdominal pain    Shortness of breath 10/14/2014   Odynophagia    History of esophageal stricture    Esophageal dysmotility    Renal insufficiency 02/14/2014   Acute renal failure (HCC) 02/14/2014   Nephrolithiasis 02/13/2014   Gross hematuria 02/13/2014   Convulsions/seizures (HCC) 07/26/2013   Pericardial cyst    Mediastinal mass 02/05/2013   Cervical spondylosis 11/17/2011   Chest pain 11/16/2011   Hypertension    Hyperlipidemia    Hypothyroidism    History of  seizures    Depression    Carpal tunnel syndrome    Paroxysmal SVT (supraventricular tachycardia) (HCC) 01/02/2011   CAD in native artery    ANEMIA-UNSPECIFIED 12/12/2009   History of drug coated stent 12/01/2009   GERD 01/19/2008   IBS 01/19/2008    Palliative Care Assessment & Plan   Patient Profile: 78 y.o. female  with past medical history of CAD with PCI to RCA, recent cath 9/23 with nonobstructive CAD and patent stents, mild aortic stenosis, echo EF 60-65% in 2023, HTN, anxiety, chronic pain syndrome, GERD, hypothyroidism, HLD, ILD, CKD 3a was admitted on 03/20/2023 with acute on chronic diastolic CHF, acute urinary retention, possible cellulitis, elevated troponin.  After discussions with attending, patient and family opted for patient's transition to full comfort  care on 03/29/2023.   Of note, patient has had 2 admissions and 1 ED visit in the  last 6 months  Assessment: Principal Problem:   Acute on chronic diastolic (congestive) heart failure (HCC) Active Problems:   CAD in native artery   GERD   IBS   Hypertension   Hyperlipidemia   Hypothyroidism   History of  seizures   Depression   ILD (interstitial lung disease) (HCC)   Mild aortic stenosis   Chronic pain syndrome   Chronic kidney disease, stage 3a (HCC)   Terminal care  Recommendations/Plan: Continue full comfort measures Continue DNR/DNI as previously documented Transfer to [facility] pending evaluation and bed availability. [facility] liaison following and will re-evaluate as appropriate  Continue current comfort focused medication regimen - no changes PMT will continue to follow and support holistically   Symptom Management Morphine PRN severe pain/dyspnea/increased work of breathing/RR>25 Norco PRN moderate pain Tylenol PRN pain/fever Biotin twice daily Robinul PRN secretions Haldol PRN agitation/delirium Ativan PRN anxiety/seizure/sleep/distress Zofran PRN nausea/vomiting Liquifilm Tears PRN dry eye Nitroglycerin PRN chest  Nebulizer PRN wheezing Continue amiodarone, Xanax PRN, Eliquis, Keppra, Imodium PRN, MiraLAX PRN, Senokot for comfort while tolerating p.o.'s  Goals of Care and Additional Recommendations: Limitations on Scope of Treatment: Full Comfort Care  Code Status:    Code Status Orders  (From admission, onward)           Start     Ordered   03/29/23 1329  Do not attempt resuscitation (DNR) - Comfort care  Continuous       Question Answer Comment  If patient has no pulse and is not breathing Do Not Attempt Resuscitation   In Pre-Arrest Conditions (Patient Is Breathing and Has a Pulse) Provide comfort measures. Relieve any mechanical airway obstruction. Avoid transfer unless required for comfort.   Consent: Discussion documented in EHR or  advanced directives reviewed      03/29/23 1329           Code Status History     Date Active Date Inactive Code Status Order ID Comments User Context   03/27/2023 1116 03/29/2023 1329 Limited: Do not attempt resuscitation (DNR) -DNR-LIMITED -Do Not Intubate/DNI  [id number]  Hughie Closs, MD Inpatient   03/20/2023 1649 03/27/2023 1115 Do not attempt resuscitation (DNR) PRE-ARREST INTERVENTIONS DESIRED [id number]  Rolly Salter, MD ED   11/18/2022 1333 11/27/2022 1558 Limited: Do not attempt resuscitation (DNR) -DNR-LIMITED -Do Not Intubate/DNI  [id number]  Maryln Gottron, MD Inpatient   09/15/2021 2053 09/17/2021 1951 DNR [id number]  Margie Ege A, DO ED   09/12/2019 1234 09/12/2019 2027 Full Code [id number]  Corky Crafts, MD Inpatient   08/18/2017 1727 08/20/2017 2146 Full Code [id number]  Burnadette Pop, MD ED   03/01/2015 2105 03/03/2015 1651 Full Code [id number]  Briscoe Deutscher, MD ED   02/23/2015 2113 02/26/2015 1852 Full Code [id number]  Ron Parker, MD Inpatient   01/24/2015 1907 01/28/2015 1656 Full Code [id number]  Ardelle Balls, PA-C Inpatient   10/14/2014 2117 10/16/2014 1903 Partial Code [id number]  Lorretta Harp, MD ED   02/14/2014 1705 02/16/2014 2110 Full Code [id number]  Kathlen Mody, MD Inpatient   02/05/2013 1056 02/10/2013 1519 Full Code [id number]  Ardelle Balls, PA-C Inpatient   10/23/2012 2311 10/24/2012 2115 Full Code [id number]  Leeann Must, MD Inpatient   11/16/2011 1840 11/18/2011 1524 Full Code [id number]  Silvestre Gunner, RN Inpatient   01/03/2011 0513 01/03/2011 1934 Full Code [id number]  Thora Lance, RN Inpatient      Advance Directive Documentation    Flowsheet Row Most Recent Value  Type of Advance Directive Healthcare Power of Attorney, Living will  Pre-existing out of facility DNR order (yellow form or pink MOST form) --  "MOST" Form in Place? --       Prognosis:  Days-weeks  Discharge Planning: Hospice facility  Care plan was discussed with  primary RN, hospice liaison, TOC, Dr. Jacqulyn Bath  Thank you for allowing the Palliative Medicine Team to assist in the care of this patient.    Haskel Khan, NP  Please contact Palliative Medicine Team phone at [phone] for questions and concerns.   *Portions of this note are a verbal dictation therefore any spelling and/or grammatical errors are due to the "Dragon Medical One" system interpretation.

## 2023-04-01 DIAGNOSIS — I5033 Acute on chronic diastolic (congestive) heart failure: Secondary | ICD-10-CM | POA: Diagnosis not present

## 2023-04-01 MED ORDER — TRAZODONE HCL 50 MG PO TABS
50.0000 mg | ORAL_TABLET | Freq: Every evening | ORAL | Status: DC | PRN
  Administered 2023-04-01: 50 mg via ORAL
  Filled 2023-04-01: qty 1

## 2023-04-01 NOTE — TOC Transition Note (Signed)
 Transition of Care [facility]) - Discharge Note   Patient Details  Name: [name] MRN: [id number] Date of Birth: 09-Jan-1946  Transition of Care [facility]) CM/SW Contact:  Delilah Shan, LCSWA Phone Number: 04/01/2023, 10:51 AM   Clinical Narrative:     Patient will DC to: [facility]   Anticipated DC date: 04/01/2023  Family notified: [name]   Transport by: Sharin Mons  ?  Per MD patient ready for DC to [facility] . RN, patient, patient's family, and facility notified of DC. RN given number for report [phone] . DC packet on chart. DNR signed by MD attached to patients DC packet.Ambulance transport requested for patient.  CSW signing off.   Final next level of care: [facility]) Barriers to Discharge: No Barriers Identified   Patient Goals and CMS Choice Patient states their goals for this hospitalization and ongoing recovery are:: SNF   Choice offered to / list presented to :  (Niece [name])      Discharge Placement              Patient chooses bed at:  [facility]) Patient to be transferred to facility by: [facility] Name of family member notified: [name] Patient and family notified of of transfer: 04/01/23  Discharge Plan and Services Additional resources added to the After Visit Summary for   In-house Referral: Clinical Social Work Discharge Planning Services: CM Consult                                 Social Drivers of Health (SDOH) Interventions SDOH Screenings   Food Insecurity: No Food Insecurity (03/21/2023)  Housing: Low Risk  (03/21/2023)  Transportation Needs: No Transportation Needs (03/21/2023)  Utilities: Not At Risk (03/21/2023)  Social Connections: Socially Isolated (03/21/2023)  Tobacco Use: Medium Risk (03/22/2023)     Readmission Risk Interventions    03/22/2023    1:22 PM 11/26/2022   12:03 PM 11/19/2022   11:49 AM  Readmission Risk Prevention Plan  Transportation Screening Complete Complete  Complete  PCP or Specialist Appt within 5-7 Days  Complete Complete  PCP or Specialist Appt within 3-5 Days Complete    Home Care Screening  Complete Complete  Medication Review (RN CM)  Complete Complete  HRI or Home Care Consult Complete    Medication Review (RN Care Manager) Complete

## 2023-04-01 NOTE — Progress Notes (Signed)
 TR[facility] night cross cover note:   Patient requesting medication for sleep. I subsequently added as needed trazodone for sleep.    Newton Pigg, DO Hospitalist

## 2023-04-01 NOTE — TOC Progression Note (Signed)
 Transition of Care [facility]) - Progression Note    Patient Details  Name: [name] MRN: [id number] Date of Birth: 1945-02-09  Transition of Care [facility]) CM/SW Contact  Delilah Shan, LCSWA Phone Number: 04/01/2023, 10:47 AM  Clinical Narrative:     Shawn with [facility] informed CSW and MD that they are able to offer a bed at [facility] today. Shawn with [facility] will let CSW know when consents are complete and [facility] can be called. CSW will continue to follow.  Expected Discharge Plan: Skilled Nursing Facility Barriers to Discharge: No Barriers Identified  Expected Discharge Plan and Services In-house Referral: Clinical Social Work Discharge Planning Services: CM Consult   Living arrangements for the past 2 months: Single Family Home Expected Discharge Date: 04/01/23                                     Social Determinants of Health (SDOH) Interventions SDOH Screenings   Food Insecurity: No Food Insecurity (03/21/2023)  Housing: Low Risk  (03/21/2023)  Transportation Needs: No Transportation Needs (03/21/2023)  Utilities: Not At Risk (03/21/2023)  Social Connections: Socially Isolated (03/21/2023)  Tobacco Use: Medium Risk (03/22/2023)    Readmission Risk Interventions    03/22/2023    1:22 PM 11/26/2022   12:03 PM 11/19/2022   11:49 AM  Readmission Risk Prevention Plan  Transportation Screening Complete Complete Complete  PCP or Specialist Appt within 5-7 Days  Complete Complete  PCP or Specialist Appt within 3-5 Days Complete    Home Care Screening  Complete Complete  Medication Review (RN CM)  Complete Complete  HRI or Home Care Consult Complete    Medication Review (RN Care Manager) Complete

## 2023-04-01 NOTE — Discharge Summary (Signed)
 Physician Discharge Summary  [name] MVH:[id number] DOB: 05-Jul-1945 DOA: 03/20/2023  PCP: Merri Brunette, MD  Admit date: 03/20/2023 Discharge date: 04/01/2023 30 Day Unplanned Readmission Risk Score    Flowsheet Row ED to Hosp-Admission (Current) from 03/20/2023 in [facility]  30 Day Unplanned Readmission Risk Score (%) 31.8 Filed at 03/30/2023 1200       This score is the patient's risk of an unplanned readmission within 30 days of being discharged (0 -100%). The score is based on dignosis, age, lab data, medications, orders, and past utilization.   Low:  0-14.9   Medium: 15-21.9   High: 22-29.9   Extreme: 30 and above          Admitted From: Home Disposition: Hospice house  Discharge Condition: Guarded CODE STATUS: DNR Diet recommendation: As placed by patient.  Subjective: Seen and examined this morning.  Patient much more comfortable today.  No complaints.  Brief/Interim Summary: Patient with PMH of CAD with PCI to RCA, recent cath 9/23 with nonobstructive CAD and patent stents, mild aortic stenosis, echo EF 6065% in 2023, HTN, anxiety, chronic pain syndrome, GERD, hypothyroidism, HLD, ILD, CKD 3A presented to the hospital with complaints of leg swelling and shortness of breath. Went to see PCP in 2/27 there was some trace edema noted but no significant shortness of breath.  niece found the patient with very short of breath and therefore patient was brought to the hospital.  Details below.   Acute hypoxic respiratory failure secondary to acute on chronic diastolic CHF: Upon admission, BNP significantly elevated 1056.  Significant swelling in the leg.  Tripoding requiring BiPAP therapy and NRB in the ER. Chest x-ray shows significant vascular congestion.  There is comment about pneumonia although pneumonia is less likely. Echocardiogram 60 to 65% in September 2023. Last cath 9/23 40% stenosis and RPDA and second diagonal, otherwise nonobstructive CAD with  patent stent in RCA. Continue BiPAP as needed.  Cardiology following and managing.  Diuretics were held for few days due to rising creatinine but she was started on Lasix 40 mg IV daily on 03/26/2023.  However in the afternoon that day Around 1:45 PM on 03/26/2023, rapid response was called due to shortness of breath and increasing oxygen demand.  Patient was noted to have crackles.  Chest x-ray ordered, patient was started on Amio drip.  Patient had already received Lasix in the morning.  Another dose of Lasix was given.  Followed by this, the nurse called CODE BLUE on this patient around 2:30 PM while the patient was breathing and there was no loss of pulse.  CODE BLUE team arrived at the bedside and found patient sitting in bed, has pulse, able to respond. She on BIPAP @ bedside due to desaturation.  BiPAP was removed and she was placed on 10 L salter.  Due to continued high amount of oxygen requirement and rails, interstitial lung disease/pneumonitis was considered to be one of the cause for her current condition so I started her on Solu-Medrol on 03/27/2023.  Patient's symptoms started to improve, hypoxia improved, she was weaned to RA. Cardiology discontinued Lasix.   AKI on CKD stage IIIb: Baseline creatinine appears to be around 1.3-1.5, peaked at 1.91 but improved to 1.6 on 03/24/2023 and has been stable.   Hypokalemia: Resolved   Acute anemia: Almost 3 months ago, patient's hemoglobin was normal.  She presents with 10.6 which gradually dropped to 8.6 but improved to 9.3 without any transfusion.  No reports  of hematemesis, melena or hematochezia.  Cardiology concerned about GI bleed and ordered FOBT which was negative.    History of SVT but here with paroxysmal atrial fibrillation with RVR. Patient has history of SVT and is on Cardizem PTA. Cardizem was on hold on admission to allow room for diuresis. Found to have A-fib with RVR early in the morning of 3/10.  Patient has been on and off amiodarone  drip, cardiology has discontinued IV and started on oral amiodarone and she was started on Coreg and digoxin on 03/28/2023.  Rates better controlled.  She is being discharged on amiodarone.  Cardiology started her on Coreg but that was discontinued by palliative care.   Acute urinary retention. Was unable to urinate on her own with bladder scan showing 400+ cc.  Placed Foley catheter which was eventually discontinued on 03/25/2023 and patient has had many successful voiding trials.   Possible cellulitis. Patient was presumed to have cellulitis of bilateral legs based on appearance.  She was started on cefazolin.  On my examination, I did not see any erythema, no warmth or tenderness.  Looks like antibiotics were started on 03/20/2023, will continue for total of 5 to 7 days.   History of CAD and mild aortic stenosis/elevated troponin. Last 2023.  Nonobstructive CAD with patent stent.Troponins minimally elevated but EKG does not show any evidence of acute ischemia.  Echo shows preserved EF.  No significant wall abnormality.  Likely demand ischemia.   Hypertension: Blood pressure on the low side.  All antihypertensives on hold.   Acute metabolic encephalopathy. Likely in the setting of hypotension. Per previous hospitalist documentation  "blood pressure corrected with midodrine with improvement in mentation".  However I did not see any midodrine ordered.  Patient is alert and oriented, needs some prompts though.   Hypothyroidism. Continued on Synthroid. Both TSH and free T4 elevated. I reduced Synthroid from 88 mcg to 75 mcg starting 03/24/2023.  However palliative care recommended discontinuing this medication at discharge.   History of seizures. Continuing Keppra.     Depression. On Cymbalta,   HLD. Continuing statin.   Goals of care conversation. Previous hospitalist discussed with the patient as well as in presence of niece.  Patient wanted to be DNR however per documentation/CODE STATUS  order, she would like to be intubated if needed.  Patient herself did not appear to have the understanding of CODE STATUS at the moment so on 03/27/2023 I had a lengthy discussion with the patient's [facility] POA/niece [name] and she clarified that there may have been some misunderstanding but patient is supposed to be full DNR with no intubation and no CPR.  I changed the CODE STATUS after talking to her.  We then talked in length about patient's poor prognosis and that I am concerned that patient may not do well during this hospitalization.  The niece was very much understanding of this as patient's husband went through hospitalization and eventually transition to comfort care a year ago or so.  She was very much accepting of that.    Palliative care consulted.  Awaiting evaluation.  However today patient said " I am ready to go" and I did clarify with her and she clearly said that she is ready to go meet the guard.  We also discussed if she would like for [place] to discontinue life-saving medications and focus on comfort care only and to that, she agreed.  I then spoke to the niece [name] again 03/29/2023.  She verbalized that  the patient has been saying this for last 2 days and she brought other family members to meet her as well and patient basically said goodbye to them.  Samara Deist believes that patient has made her mind and she is ready for [place] to transition her to comfort care and she is also on the same page so with her agreement, patient was transitioned to comfort care on the afternoon of 03/29/2023.  She was seen by palliative care as well. She is being discharged to [facility].  Discharge plan was discussed with patient and/or family member and they verbalized understanding and agreed with it.  Discharge Diagnoses:  Principal Problem:   Acute on chronic diastolic (congestive) heart failure (HCC) Active Problems:   Hypertension   Hyperlipidemia   Hypothyroidism   History of  seizures    Depression   CAD in native artery   GERD   IBS   ILD (interstitial lung disease) (HCC)   Mild aortic stenosis   Chronic pain syndrome   Chronic kidney disease, stage 3a (HCC)    Discharge Instructions   Allergies as of 04/01/2023   No Known Allergies      Medication List     STOP taking these medications    ALPRAZolam 0.25 MG tablet Commonly known as: XANAX   ARIPiprazole 5 MG tablet Commonly known as: ABILIFY   aspirin EC 81 MG tablet   calcitRIOL 0.25 MCG capsule Commonly known as: ROCALTROL   [employer] D3 50 MCG (2000 UT) Caps Generic drug: Cholecalciferol   diltiazem 180 MG 24 hr capsule Commonly known as: CARDIZEM CD   DULoxetine 30 MG capsule Commonly known as: CYMBALTA   DULoxetine 60 MG capsule Commonly known as: CYMBALTA   ferrous sulfate 325 (65 FE) MG tablet   HYDROcodone-acetaminophen 10-325 MG tablet Commonly known as: NORCO   nebivolol 5 MG tablet Commonly known as: BYSTOLIC   rosuvastatin 40 MG tablet Commonly known as: CRESTOR       TAKE these medications    acetaminophen 500 MG tablet Commonly known as: TYLENOL Take 500 mg by mouth every 6 (six) hours as needed for mild pain (pain score 1-3) or moderate pain (pain score 4-6).   amiodarone 200 MG tablet Commonly known as: PACERONE Take 1 tablet (200 mg total) by mouth 2 (two) times daily.   apixaban 2.5 MG Tabs tablet Commonly known as: ELIQUIS Take 1 tablet (2.5 mg total) by mouth 2 (two) times daily.   levETIRAcetam 500 MG tablet Commonly known as: KEPPRA TAKE 1 TABLET BY MOUTH TWICE A DAY   levothyroxine 88 MCG tablet Commonly known as: SYNTHROID Take 88 mcg by mouth every morning.   nitroGLYCERIN 0.4 MG SL tablet Commonly known as: NITROSTAT PLACE 1 TABLET (0.4 MG TOTAL) UNDER THE TONGUE EVERY 5 (FIVE) MINUTES AS NEEDED FOR CHEST PAIN.   ondansetron 4 MG tablet Commonly known as: ZOFRAN Take 1 tablet (4 mg total) by mouth every 6 (six) hours as needed for nausea.    pantoprazole 40 MG tablet Commonly known as: PROTONIX Take 1 tablet (40 mg total) by mouth 2 (two) times daily.   potassium chloride SA 20 MEQ tablet Commonly known as: KLOR-CON M Take 20 mEq by mouth 2 (two) times daily.         Contact information for follow-up providers     Merri Brunette, MD Follow up in 1 week(s).   Specialty: Family Medicine Contact information: [address] [place] [address] [phone]  Contact information for after-discharge care     Destination     [facility] .   Service: Skilled Nursing Contact information: [address] [place] [address] [phone]                    No Known Allergies  Consultations: Cardiology and palliative care   Procedures/Studies: CT Chest High Resolution Result Date: 03/28/2023 CLINICAL DATA:  78 year old female with history of hypoxia. Shortness of breath. Suspected interstitial lung disease. EXAM: CT CHEST WITHOUT CONTRAST TECHNIQUE: Multidetector CT imaging of the chest was performed following the standard protocol without intravenous contrast. High resolution imaging of the lungs, as well as inspiratory and expiratory imaging, was performed. RADIATION DOSE REDUCTION: This exam was performed according to the departmental dose-optimization program which includes automated exposure control, adjustment of the mA and/or kV according to patient size and/or use of iterative reconstruction technique. COMPARISON:  High-resolution chest CT 02/19/2019. FINDINGS: Cardiovascular: Heart size is mildly enlarged. There is no significant pericardial fluid, thickening or pericardial calcification. There is aortic atherosclerosis, as well as atherosclerosis of the great vessels of the mediastinum and the coronary arteries, including calcified atherosclerotic plaque in the left main, left anterior descending, left circumflex and right coronary arteries.  Severe calcifications of the mitral annulus. Moderate calcifications of the aortic valve. Right upper extremity PICC with tip terminating in the distal superior vena cava. Mediastinum/Nodes: No pathologically enlarged mediastinal or hilar lymph nodes. Please note that accurate exclusion of hilar adenopathy is limited on noncontrast CT scans. Esophagus is unremarkable in appearance. No axillary lymphadenopathy. Lungs/Pleura: High-resolution images are limited by extensive patient respiratory motion. With these limitations in mind, there are widespread areas of ground-glass attenuation, septal thickening, thickening of the peribronchovascular interstitium and architectural distortion scattered throughout the lungs bilaterally. These findings have no definitive gradient, although there does appear to be a slightly greater extent of upper lung involvement. No frank honeycombing. Small to moderate bilateral pleural effusions. Upper Abdomen: Aortic atherosclerosis. Trace volume of ascites. Amorphous high attenuation lying dependently in the gallbladder, likely biliary sludge. Musculoskeletal: Median sternotomy wires. There are no aggressive appearing lytic or blastic lesions noted in the visualized portions of the skeleton. IMPRESSION: 1. The appearance of the lungs is grossly abnormal, but the findings are nonspecific. In the appropriate clinical setting, this may simply represent severe multilobar bilateral bronchopneumonia. The possibility of some degree of pulmonary edema should also be considered. The possibility of interstitial lung disease is not excluded, but can not be accurately discerned on today's limited study. Repeat high-resolution chest CT should be considered in 3 months after resolution of the patient's acute illness to better characterize the true extent of non reversible disease. 2. Cardiomegaly. 3. Aortic atherosclerosis, in addition to left main and three-vessel coronary artery disease. Assessment  for potential risk factor modification, dietary therapy or pharmacologic therapy may be warranted, if clinically indicated. 4. There are calcifications of the aortic valve and mitral annulus. Echocardiographic correlation for evaluation of potential valvular dysfunction may be warranted if clinically indicated. 5. Trace volume of ascites. 6. Probable biliary sludge lying dependently in the gallbladder. Aortic Atherosclerosis (ICD10-I70.0). Electronically Signed   By: Trudie Reed M.D.   On: 03/28/2023 06:48   DG Chest Port 1 View Result Date: 03/26/2023 CLINICAL DATA:  Difficulty breathing, acute respiratory distress EXAM: PORTABLE CHEST 1 VIEW COMPARISON:  03/22/2023, 03/20/2023 FINDINGS: 2 frontal views of the chest demonstrate right-sided PICC tip overlying atriocaval junction. Cardiac silhouette is stable, with dense  calcification of the mitral annulus again noted. Postsurgical changes are seen from prior partial right lower lobe resection. Bilateral upper lobe predominant airspace disease again identified, without appreciable change allowing for differences in positioning and technique. Small bilateral effusions. No pneumothorax. No acute bony abnormalities. IMPRESSION: 1. Upper lobe predominant bilateral airspace disease, not significantly changed since 03/22/2023 but slightly progressed since 03/20/2023. Findings could reflect worsening infection or edema. 2. Small bilateral pleural effusions. Electronically Signed   By: Sharlet Salina M.D.   On: 03/26/2023 14:53   DG CHEST PORT 1 VIEW Result Date: 03/22/2023 CLINICAL DATA:  Central line EXAM: PORTABLE CHEST 1 VIEW COMPARISON:  Chest x-ray 03/20/2023 FINDINGS: Right upper extremity PICC terminates in the distal SVC. There is no pneumothorax. Sternotomy wires are present. The cardiomediastinal silhouette is stable, the heart is mildly enlarged. Bilateral multifocal airspace disease has not significantly changed. No pleural effusion or pneumothorax.  No acute fractures are seen. IMPRESSION: 1. Right upper extremity PICC terminates in the distal SVC. No pneumothorax. 2. Stable bilateral multifocal airspace disease. Electronically Signed   By: Darliss Cheney M.D.   On: 03/22/2023 18:54   [place] EKG SITE RITE Result Date: 03/22/2023 If Site Rite image not attached, placement could not be confirmed due to current cardiac rhythm.  ECHOCARDIOGRAM COMPLETE Result Date: 03/21/2023    ECHOCARDIOGRAM REPORT   Patient Name:   LISANDRA MATHISEN Date of Exam: 03/21/2023 Medical Rec #:  [id number]      Height:       60.0 in Accession #:    [id number]     Weight:       110.0 lb Date of Birth:  03-30-1945      BSA:          1.448 m Patient Age:    77 years       BP:           97/62 mmHg Patient Gender: F              HR:           154 bpm. Exam Location:  Inpatient Procedure: 2D Echo, Cardiac Doppler and Color Doppler (Both Spectral and Color            Flow Doppler were utilized during procedure). Indications:    Congestive heart failure  History:        Patient has prior history of Echocardiogram examinations. CHF,                 CAD, Arrythmias:Atrial Fibrillation; Risk Factors:Hypertension.  Sonographer:    Lamont Snowball Referring Phys: Zachery Conch PATEL  Sonographer Comments: Image acquisition challenging due to uncooperative patient. IMPRESSIONS  1. Left ventricular ejection fraction, by estimation, is 60 to 65%. The left ventricle has normal function. The left ventricle has no regional wall motion abnormalities. There is moderate concentric left ventricular hypertrophy. Left ventricular diastolic parameters are indeterminate.  2. Right ventricular systolic function is moderately reduced. The right ventricular size is moderately enlarged.  3. Left atrial size was moderately dilated.  4. Right atrial size was severely dilated.  5. The mitral valve is degenerative. Mild mitral valve regurgitation. Moderate mitral annular calcification.  6. Tricuspid valve regurgitation is mild to  moderate.  7. Suspect moderate low-flow/low-gradient AS. Marland Kitchen The aortic valve is tricuspid. There is moderate calcification of the aortic valve. Aortic valve regurgitation is not visualized. Moderate aortic valve stenosis. Aortic valve area, by VTI measures 1.10 cm. Aortic valve mean gradient measures 8.0  mmHg. Aortic valve Vmax measures 1.88 m/s.  8. The inferior vena cava is dilated in size with <50% respiratory variability, suggesting right atrial pressure of 15 mmHg. Conclusion(s)/Recommendation(s): PAtient in rapid AF at time of study. FINDINGS  Left Ventricle: Left ventricular ejection fraction, by estimation, is 60 to 65%. The left ventricle has normal function. The left ventricle has no regional wall motion abnormalities. The left ventricular internal cavity size was normal in size. There is  moderate concentric left ventricular hypertrophy. Left ventricular diastolic parameters are indeterminate. Right Ventricle: The right ventricular size is moderately enlarged. No increase in right ventricular wall thickness. Right ventricular systolic function is moderately reduced. Left Atrium: Left atrial size was moderately dilated. Right Atrium: Right atrial size was severely dilated. Pericardium: There is no evidence of pericardial effusion. Mitral Valve: The mitral valve is degenerative in appearance. There is mild calcification of the mitral valve leaflet(s). Moderate mitral annular calcification. Mild mitral valve regurgitation. MV peak gradient, 6.5 mmHg. The mean mitral valve gradient is 4.0 mmHg. Tricuspid Valve: The tricuspid valve is normal in structure. Tricuspid valve regurgitation is mild to moderate. Aortic Valve: Suspect moderate low-flow/low-gradient AS. The aortic valve is tricuspid. There is moderate calcification of the aortic valve. Aortic valve regurgitation is not visualized. Moderate aortic stenosis is present. Aortic valve mean gradient measures 8.0 mmHg. Aortic valve peak gradient measures 14.2  mmHg. Aortic valve area, by VTI measures 1.10 cm. Pulmonic Valve: The pulmonic valve was normal in structure. Pulmonic valve regurgitation is trivial. Aorta: The aortic root is normal in size and structure. Venous: The inferior vena cava is dilated in size with less than 50% respiratory variability, suggesting right atrial pressure of 15 mmHg. IAS/Shunts: No atrial level shunt detected by color flow Doppler.  LEFT VENTRICLE PLAX 2D LVIDd:         2.80 cm LVIDs:         2.20 cm LV PW:         1.30 cm LV IVS:        1.20 cm LVOT diam:     1.90 cm LV SV:         31 LV SV Index:   21 LVOT Area:     2.84 cm  RIGHT VENTRICLE          IVC RV Basal diam:  4.50 cm  IVC diam: 1.90 cm LEFT ATRIUM             Index        RIGHT ATRIUM           Index LA Vol (A2C):   45.8 ml 31.63 ml/m  RA Area:     16.80 cm LA Vol (A4C):   30.4 ml 21.00 ml/m  RA Volume:   47.60 ml  32.88 ml/m LA Biplane Vol: 38.0 ml 26.25 ml/m  AORTIC VALVE AV Area (Vmax):    1.25 cm AV Area (Vmean):   1.17 cm AV Area (VTI):     1.10 cm AV Vmax:           188.50 cm/s AV Vmean:          136.500 cm/s AV VTI:            0.282 m AV Peak Grad:      14.2 mmHg AV Mean Grad:      8.0 mmHg LVOT Vmax:         83.33 cm/s LVOT Vmean:        56.233 cm/s LVOT  VTI:          0.109 m LVOT/AV VTI ratio: 0.39  AORTA Ao Root diam: 2.60 cm Ao Asc diam:  2.90 cm MITRAL VALVE MV Area (PHT): 6.13 cm     SHUNTS MV Area VTI:   2.46 cm     Systemic VTI:  0.11 m MV Peak grad:  6.5 mmHg     Systemic Diam: 1.90 cm MV Mean grad:  4.0 mmHg MV Vmax:       1.27 m/s MV Vmean:      95.4 cm/s MV Decel Time: 124 msec MV E velocity: 115.67 cm/s Arvilla Meres MD Electronically signed by Arvilla Meres MD Signature Date/Time: 03/21/2023/1:52:00 PM    Final    DG Chest Portable 1 View Result Date: 03/20/2023 CLINICAL DATA:  Shortness of breath. EXAM: PORTABLE CHEST 1 VIEW COMPARISON:  November 18, 2022. FINDINGS: Stable cardiomediastinal silhouette. Sternotomy wires are noted. Diffuse  interstitial densities are noted throughout both lungs concerning for pulmonary edema or possibly multifocal pneumonia. Small pleural effusions are noted. Bony thorax is unremarkable. IMPRESSION: Diffuse bilateral lung opacities concerning for edema or pneumonia. Electronically Signed   By: Lupita Raider M.D.   On: 03/20/2023 13:51     Discharge Exam: Vitals:   03/30/23 1938 03/31/23 0928  BP: 107/74 124/88  Pulse: 98 99  Resp: 20 19  Temp: 98 F (36.7 C) 98.2 F (36.8 C)  SpO2: 93% 91%   Vitals:   03/29/23 1950 03/29/23 2000 03/30/23 1938 03/31/23 0928  BP: 108/79  107/74 124/88  Pulse: 87 (!) 110 98 99  Resp: 16  20 19   Temp: 97.8 F (36.6 C)  98 F (36.7 C) 98.2 F (36.8 C)  TempSrc: Oral  Oral Oral  SpO2: 96% 95% 93% 91%  Weight:      Height:        General: Pt is alert, awake, not in acute distress Cardiovascular: RRR, S1/S2 +, no rubs, no gallops Respiratory: CTA bilaterally, no wheezing, no rhonchi Abdominal: Soft, NT, ND, bowel sounds + Extremities: no edema, no cyanosis    The results of significant diagnostics from this hospitalization (including imaging, microbiology, ancillary and laboratory) are listed below for reference.     Microbiology: Recent Results (from the past 240 hours)  MRSA Next Gen by PCR, Nasal     Status: None   Collection Time: 03/25/23 11:29 PM   Specimen: Nasal Mucosa; Nasal Swab  Result Value Ref Range Status   MRSA by PCR Next Gen NOT DETECTED NOT DETECTED Final    Comment: (NOTE) The GeneXpert MRSA Assay (FDA approved for NASAL specimens only), is one component of a comprehensive MRSA colonization surveillance program. It is not intended to diagnose MRSA infection nor to guide or monitor treatment for MRSA infections. Test performance is not FDA approved in patients less than 48 years old. Performed at [facility], [address], [place], [place] [address]      Labs: BNP (last 3 results) Recent Labs     03/25/23 0933 03/27/23 0530 03/29/23 0500  BNP 1,201.7* 876.3* 676.0*   Basic Metabolic Panel: Recent Labs  Lab 03/26/23 0500 03/27/23 0530 03/28/23 0907 03/28/23 1058 03/29/23 0500  NA 138 140 137  --  138  K 3.4* 3.7 3.2*  --  3.7  CL 103 100 91*  --  92*  CO2 26 28 29   --  32  GLUCOSE 118* 120* 167*  --  149*  BUN 32* 30*  34*  --  44*  CREATININE 1.58* 1.60* 1.67*  --  1.65*  CALCIUM 7.0* 7.1* 6.6*  --  6.3*  MG  --   --   --  1.1*  --    Liver Function Tests: No results for input(s): "AST", "ALT", "ALKPHOS", "BILITOT", "PROT", "ALBUMIN" in the last 168 hours.  No results for input(s): "LIPASE", "AMYLASE" in the last 168 hours. No results for input(s): "AMMONIA" in the last 168 hours. CBC: Recent Labs  Lab 03/26/23 0500 03/27/23 0530 03/28/23 0430 03/29/23 0500  WBC 10.4 11.7* 9.7 9.7  HGB 9.3* 9.9* 10.3* 10.3*  HCT 29.9* 31.1* 31.1* 32.8*  MCV 93.1 91.5 89.9 90.9  PLT 337 385 398 470*   Cardiac Enzymes: No results for input(s): "CKTOTAL", "CKMB", "CKMBINDEX", "TROPONINI" in the last 168 hours. BNP: Invalid input(s): "POCBNP" CBG: Recent Labs  Lab 03/28/23 0750 03/28/23 1154 03/28/23 1623  GLUCAP 142* 93 112*   D-Dimer No results for input(s): "DDIMER" in the last 72 hours. Hgb A1c No results for input(s): "HGBA1C" in the last 72 hours. Lipid Profile No results for input(s): "CHOL", "HDL", "LDLCALC", "TRIG", "CHOLHDL", "LDLDIRECT" in the last 72 hours. Thyroid function studies No results for input(s): "TSH", "T4TOTAL", "T3FREE", "THYROIDAB" in the last 72 hours.  Invalid input(s): "FREET3" Anemia work up No results for input(s): "VITAMINB12", "FOLATE", "FERRITIN", "TIBC", "IRON", "RETICCTPCT" in the last 72 hours. Urinalysis    Component Value Date/Time   COLORURINE YELLOW 11/18/2022 0852   APPEARANCEUR CLOUDY (A) 11/18/2022 0852   LABSPEC 1.040 (H) 11/18/2022 0852   PHURINE 6.0 11/18/2022 0852   GLUCOSEU NEGATIVE 11/18/2022 0852   HGBUR  LARGE (A) 11/18/2022 0852   BILIRUBINUR SMALL (A) 11/18/2022 0852   KETONESUR TRACE (A) 11/18/2022 0852   PROTEINUR 30 (A) 11/18/2022 0852   UROBILINOGEN 0.2 02/14/2014 1643   NITRITE NEGATIVE 11/18/2022 0852   LEUKOCYTESUR SMALL (A) 11/18/2022 0852   Sepsis Labs Recent Labs  Lab 03/26/23 0500 03/27/23 0530 03/28/23 0430 03/29/23 0500  WBC 10.4 11.7* 9.7 9.7   Microbiology Recent Results (from the past 240 hours)  MRSA Next Gen by PCR, Nasal     Status: None   Collection Time: 03/25/23 11:29 PM   Specimen: Nasal Mucosa; Nasal Swab  Result Value Ref Range Status   MRSA by PCR Next Gen NOT DETECTED NOT DETECTED Final    Comment: (NOTE) The GeneXpert MRSA Assay (FDA approved for NASAL specimens only), is one component of a comprehensive MRSA colonization surveillance program. It is not intended to diagnose MRSA infection nor to guide or monitor treatment for MRSA infections. Test performance is not FDA approved in patients less than 39 years old. Performed at [facility], [address], [place], [place] [address]     FURTHER DISCHARGE INSTRUCTIONS:   Get Medicines reviewed and adjusted: Please take all your medications with you for your next visit with your Primary MD   Laboratory/radiological data: Please request your Primary MD to go over all hospital tests and procedure/radiological results at the follow up, please ask your Primary MD to get all Hospital records sent to his/her office.   In some cases, they will be blood work, cultures and biopsy results pending at the time of your discharge. Please request that your primary care M.D. goes through all the records of your hospital data and follows up on these results.   Also Note the following: If you experience worsening of your admission symptoms, develop shortness of breath, life threatening  emergency, suicidal or homicidal thoughts you must seek medical attention immediately by calling 911 or calling your MD  immediately  if symptoms less severe.   You must read complete instructions/literature along with all the possible adverse reactions/side effects for all the Medicines you take and that have been prescribed to you. Take any new Medicines after you have completely understood and accpet all the possible adverse reactions/side effects.    Do not drive when taking Pain medications or sleeping medications (Benzodaizepines)   Do not take more than prescribed Pain, Sleep and Anxiety Medications. It is not advisable to combine anxiety,sleep and pain medications without talking with your primary care practitioner   Special Instructions: If you have smoked or chewed Tobacco  in the last 2 yrs please stop smoking, stop any regular Alcohol  and or any Recreational drug use.   Wear Seat belts while driving.   Please note: You were cared for by a hospitalist during your hospital stay. Once you are discharged, your primary care physician will handle any further medical issues. Please note that NO REFILLS for any discharge medications will be authorized once you are discharged, as it is imperative that you return to your primary care physician (or establish a relationship with a primary care physician if you do not have one) for your post hospital discharge needs so that they can reassess your need for medications and monitor your lab values  Time coordinating discharge: Over 30 minutes  SIGNED:   Hughie Closs, MD  [facility] 04/01/2023, 9:29 AM *Please note that this is a verbal dictation therefore any spelling or grammatical errors are due to the "[employer] One" system interpretation. If 7PM-7AM, please contact night-coverage [email or web address]

## 2023-04-01 NOTE — Plan of Care (Signed)
   Problem: Coping: Goal: Level of anxiety will decrease Outcome: Progressing   Problem: Pain Managment: Goal: General experience of comfort will improve and/or be controlled Outcome: Progressing

## 2023-04-22 ENCOUNTER — Ambulatory Visit: Payer: No Typology Code available for payment source | Attending: Cardiology | Admitting: Cardiology

## 2023-05-04 NOTE — Telephone Encounter (Signed)
 Error

## 2023-05-07 ENCOUNTER — Other Ambulatory Visit: Payer: Self-pay | Admitting: Adult Health

## 2023-05-09 ENCOUNTER — Other Ambulatory Visit: Payer: Self-pay

## 2023-05-12 ENCOUNTER — Ambulatory Visit: Payer: No Typology Code available for payment source | Admitting: Neurology

## 2023-05-12 ENCOUNTER — Encounter: Payer: Self-pay | Admitting: Neurology

## 2023-05-12 DEATH — deceased

## 2023-07-04 ENCOUNTER — Ambulatory Visit: Payer: No Typology Code available for payment source | Admitting: Adult Health
